# Patient Record
Sex: Male | Born: 1935 | Race: White | Hispanic: No | Smoking: Former smoker
Health system: Southern US, Community
[De-identification: ages and names within clinical notes are randomized; demographics above are authoritative.]

## PROBLEM LIST (undated history)

## (undated) DIAGNOSIS — M1990 Unspecified osteoarthritis, unspecified site: Secondary | ICD-10-CM

## (undated) DIAGNOSIS — K760 Fatty (change of) liver, not elsewhere classified: Secondary | ICD-10-CM

## (undated) DIAGNOSIS — D46C Myelodysplastic syndrome with isolated del(5q) chromosomal abnormality: Secondary | ICD-10-CM

## (undated) DIAGNOSIS — I350 Nonrheumatic aortic (valve) stenosis: Secondary | ICD-10-CM

## (undated) DIAGNOSIS — T4145XA Adverse effect of unspecified anesthetic, initial encounter: Secondary | ICD-10-CM

## (undated) DIAGNOSIS — I679 Cerebrovascular disease, unspecified: Secondary | ICD-10-CM

## (undated) DIAGNOSIS — K219 Gastro-esophageal reflux disease without esophagitis: Secondary | ICD-10-CM

## (undated) DIAGNOSIS — I5042 Chronic combined systolic (congestive) and diastolic (congestive) heart failure: Secondary | ICD-10-CM

## (undated) DIAGNOSIS — H9190 Unspecified hearing loss, unspecified ear: Secondary | ICD-10-CM

## (undated) DIAGNOSIS — T827XXA Infection and inflammatory reaction due to other cardiac and vascular devices, implants and grafts, initial encounter: Secondary | ICD-10-CM

## (undated) DIAGNOSIS — I509 Heart failure, unspecified: Secondary | ICD-10-CM

## (undated) DIAGNOSIS — I272 Pulmonary hypertension, unspecified: Secondary | ICD-10-CM

## (undated) DIAGNOSIS — D7589 Other specified diseases of blood and blood-forming organs: Secondary | ICD-10-CM

## (undated) DIAGNOSIS — D649 Anemia, unspecified: Secondary | ICD-10-CM

## (undated) DIAGNOSIS — G5790 Unspecified mononeuropathy of unspecified lower limb: Secondary | ICD-10-CM

## (undated) DIAGNOSIS — E785 Hyperlipidemia, unspecified: Secondary | ICD-10-CM

## (undated) DIAGNOSIS — I10 Essential (primary) hypertension: Secondary | ICD-10-CM

## (undated) DIAGNOSIS — M109 Gout, unspecified: Secondary | ICD-10-CM

## (undated) DIAGNOSIS — D696 Thrombocytopenia, unspecified: Secondary | ICD-10-CM

## (undated) DIAGNOSIS — T8859XA Other complications of anesthesia, initial encounter: Secondary | ICD-10-CM

## (undated) DIAGNOSIS — Z992 Dependence on renal dialysis: Secondary | ICD-10-CM

## (undated) DIAGNOSIS — N2581 Secondary hyperparathyroidism of renal origin: Secondary | ICD-10-CM

## (undated) DIAGNOSIS — I739 Peripheral vascular disease, unspecified: Secondary | ICD-10-CM

## (undated) DIAGNOSIS — I2510 Atherosclerotic heart disease of native coronary artery without angina pectoris: Secondary | ICD-10-CM

## (undated) DIAGNOSIS — D61818 Other pancytopenia: Secondary | ICD-10-CM

## (undated) DIAGNOSIS — K8020 Calculus of gallbladder without cholecystitis without obstruction: Secondary | ICD-10-CM

## (undated) DIAGNOSIS — I959 Hypotension, unspecified: Secondary | ICD-10-CM

## (undated) DIAGNOSIS — IMO0001 Reserved for inherently not codable concepts without codable children: Secondary | ICD-10-CM

## (undated) DIAGNOSIS — N186 End stage renal disease: Secondary | ICD-10-CM

## (undated) DIAGNOSIS — A4902 Methicillin resistant Staphylococcus aureus infection, unspecified site: Secondary | ICD-10-CM

## (undated) HISTORY — DX: Unspecified osteoarthritis, unspecified site: M19.90

## (undated) HISTORY — DX: Calculus of gallbladder without cholecystitis without obstruction: K80.20

## (undated) HISTORY — DX: Essential (primary) hypertension: I10

## (undated) HISTORY — PX: TONSILLECTOMY: SUR1361

## (undated) HISTORY — DX: Cerebrovascular disease, unspecified: I67.9

## (undated) HISTORY — DX: Gastro-esophageal reflux disease without esophagitis: K21.9

## (undated) HISTORY — DX: Fatty (change of) liver, not elsewhere classified: K76.0

## (undated) HISTORY — DX: Hyperlipidemia, unspecified: E78.5

## (undated) HISTORY — DX: Methicillin resistant Staphylococcus aureus infection, unspecified site: A49.02

## (undated) HISTORY — PX: OTHER SURGICAL HISTORY: SHX169

## (undated) HISTORY — DX: Myelodysplastic syndrome with isolated del(5q) chromosomal abnormality: D46.C

## (undated) HISTORY — DX: Peripheral vascular disease, unspecified: I73.9

## (undated) HISTORY — PX: CATARACT EXTRACTION W/ INTRAOCULAR LENS  IMPLANT, BILATERAL: SHX1307

## (undated) HISTORY — DX: Atherosclerotic heart disease of native coronary artery without angina pectoris: I25.10

## (undated) HISTORY — DX: Secondary hyperparathyroidism of renal origin: N25.81

---

## 1996-11-27 DIAGNOSIS — I2510 Atherosclerotic heart disease of native coronary artery without angina pectoris: Secondary | ICD-10-CM

## 1996-11-27 HISTORY — PX: CORONARY ARTERY BYPASS GRAFT: SHX141

## 1996-11-27 HISTORY — DX: Atherosclerotic heart disease of native coronary artery without angina pectoris: I25.10

## 1999-08-10 ENCOUNTER — Ambulatory Visit (HOSPITAL_COMMUNITY): Admission: RE | Admit: 1999-08-10 | Discharge: 1999-08-10 | Payer: Self-pay | Admitting: *Deleted

## 1999-08-29 ENCOUNTER — Ambulatory Visit (HOSPITAL_COMMUNITY): Admission: RE | Admit: 1999-08-29 | Discharge: 1999-08-29 | Payer: Self-pay | Admitting: Urology

## 1999-08-29 ENCOUNTER — Encounter: Payer: Self-pay | Admitting: Urology

## 1999-08-29 ENCOUNTER — Encounter (INDEPENDENT_AMBULATORY_CARE_PROVIDER_SITE_OTHER): Payer: Self-pay

## 1999-09-06 ENCOUNTER — Ambulatory Visit (HOSPITAL_COMMUNITY): Admission: RE | Admit: 1999-09-06 | Discharge: 1999-09-06 | Payer: Self-pay | Admitting: *Deleted

## 1999-09-09 ENCOUNTER — Inpatient Hospital Stay (HOSPITAL_COMMUNITY): Admission: AD | Admit: 1999-09-09 | Discharge: 1999-09-10 | Payer: Self-pay | Admitting: *Deleted

## 1999-10-10 ENCOUNTER — Encounter: Payer: Self-pay | Admitting: Urology

## 1999-10-11 ENCOUNTER — Encounter: Payer: Self-pay | Admitting: Urology

## 1999-10-11 ENCOUNTER — Encounter: Admission: RE | Admit: 1999-10-11 | Discharge: 1999-10-11 | Payer: Self-pay | Admitting: Urology

## 1999-10-12 ENCOUNTER — Inpatient Hospital Stay (HOSPITAL_COMMUNITY): Admission: RE | Admit: 1999-10-12 | Discharge: 1999-10-18 | Payer: Self-pay | Admitting: Urology

## 1999-11-07 ENCOUNTER — Encounter (HOSPITAL_COMMUNITY): Admission: RE | Admit: 1999-11-07 | Discharge: 2000-02-05 | Payer: Self-pay | Admitting: *Deleted

## 1999-11-28 HISTORY — PX: OTHER SURGICAL HISTORY: SHX169

## 2000-05-28 ENCOUNTER — Ambulatory Visit (HOSPITAL_COMMUNITY): Admission: RE | Admit: 2000-05-28 | Discharge: 2000-05-28 | Payer: Self-pay | Admitting: Vascular Surgery

## 2000-07-11 ENCOUNTER — Ambulatory Visit: Admission: RE | Admit: 2000-07-11 | Discharge: 2000-07-11 | Payer: Self-pay | Admitting: Vascular Surgery

## 2000-09-10 ENCOUNTER — Encounter: Payer: Self-pay | Admitting: Urology

## 2000-09-12 ENCOUNTER — Ambulatory Visit (HOSPITAL_COMMUNITY): Admission: RE | Admit: 2000-09-12 | Discharge: 2000-09-12 | Payer: Self-pay | Admitting: Urology

## 2000-09-12 ENCOUNTER — Encounter (INDEPENDENT_AMBULATORY_CARE_PROVIDER_SITE_OTHER): Payer: Self-pay | Admitting: Specialist

## 2000-09-12 ENCOUNTER — Encounter: Payer: Self-pay | Admitting: Urology

## 2001-05-08 ENCOUNTER — Encounter: Payer: Self-pay | Admitting: Nephrology

## 2001-05-08 ENCOUNTER — Encounter: Admission: RE | Admit: 2001-05-08 | Discharge: 2001-05-08 | Payer: Self-pay | Admitting: Nephrology

## 2001-05-15 ENCOUNTER — Encounter (INDEPENDENT_AMBULATORY_CARE_PROVIDER_SITE_OTHER): Payer: Self-pay | Admitting: Specialist

## 2001-05-15 ENCOUNTER — Ambulatory Visit (HOSPITAL_COMMUNITY): Admission: RE | Admit: 2001-05-15 | Discharge: 2001-05-15 | Payer: Self-pay | Admitting: Urology

## 2001-05-15 ENCOUNTER — Encounter: Payer: Self-pay | Admitting: Urology

## 2002-01-09 ENCOUNTER — Ambulatory Visit (HOSPITAL_COMMUNITY): Admission: RE | Admit: 2002-01-09 | Discharge: 2002-01-09 | Payer: Self-pay | Admitting: Vascular Surgery

## 2002-03-12 ENCOUNTER — Ambulatory Visit (HOSPITAL_BASED_OUTPATIENT_CLINIC_OR_DEPARTMENT_OTHER): Admission: RE | Admit: 2002-03-12 | Discharge: 2002-03-12 | Payer: Self-pay | Admitting: Urology

## 2002-03-12 ENCOUNTER — Encounter (INDEPENDENT_AMBULATORY_CARE_PROVIDER_SITE_OTHER): Payer: Self-pay | Admitting: Specialist

## 2002-04-14 ENCOUNTER — Encounter: Payer: Self-pay | Admitting: Nephrology

## 2002-04-14 ENCOUNTER — Ambulatory Visit (HOSPITAL_COMMUNITY): Admission: RE | Admit: 2002-04-14 | Discharge: 2002-04-14 | Payer: Self-pay | Admitting: Nephrology

## 2002-04-25 ENCOUNTER — Ambulatory Visit (HOSPITAL_COMMUNITY): Admission: RE | Admit: 2002-04-25 | Discharge: 2002-04-25 | Payer: Self-pay | Admitting: Vascular Surgery

## 2002-04-25 ENCOUNTER — Encounter: Payer: Self-pay | Admitting: Vascular Surgery

## 2002-05-28 ENCOUNTER — Ambulatory Visit (HOSPITAL_COMMUNITY): Admission: RE | Admit: 2002-05-28 | Discharge: 2002-05-28 | Payer: Self-pay | Admitting: Vascular Surgery

## 2002-11-27 HISTORY — PX: CARDIAC CATHETERIZATION: SHX172

## 2003-03-17 ENCOUNTER — Ambulatory Visit (HOSPITAL_COMMUNITY): Admission: RE | Admit: 2003-03-17 | Discharge: 2003-03-17 | Payer: Self-pay | Admitting: Internal Medicine

## 2003-03-17 ENCOUNTER — Encounter: Payer: Self-pay | Admitting: Internal Medicine

## 2003-03-29 ENCOUNTER — Encounter: Payer: Self-pay | Admitting: Emergency Medicine

## 2003-03-29 ENCOUNTER — Inpatient Hospital Stay (HOSPITAL_COMMUNITY): Admission: EM | Admit: 2003-03-29 | Discharge: 2003-03-31 | Payer: Self-pay | Admitting: Emergency Medicine

## 2003-05-08 ENCOUNTER — Encounter: Payer: Self-pay | Admitting: Urology

## 2003-05-08 ENCOUNTER — Encounter: Admission: RE | Admit: 2003-05-08 | Discharge: 2003-05-08 | Payer: Self-pay | Admitting: Urology

## 2003-05-12 ENCOUNTER — Emergency Department (HOSPITAL_COMMUNITY): Admission: EM | Admit: 2003-05-12 | Discharge: 2003-05-13 | Payer: Self-pay | Admitting: Emergency Medicine

## 2003-05-12 ENCOUNTER — Ambulatory Visit (HOSPITAL_BASED_OUTPATIENT_CLINIC_OR_DEPARTMENT_OTHER): Admission: RE | Admit: 2003-05-12 | Discharge: 2003-05-12 | Payer: Self-pay | Admitting: Urology

## 2003-05-13 ENCOUNTER — Encounter: Payer: Self-pay | Admitting: Emergency Medicine

## 2003-05-25 ENCOUNTER — Inpatient Hospital Stay (HOSPITAL_COMMUNITY): Admission: EM | Admit: 2003-05-25 | Discharge: 2003-05-25 | Payer: Self-pay | Admitting: *Deleted

## 2003-05-25 ENCOUNTER — Encounter: Payer: Self-pay | Admitting: *Deleted

## 2003-06-01 ENCOUNTER — Encounter: Payer: Self-pay | Admitting: Emergency Medicine

## 2003-06-01 ENCOUNTER — Inpatient Hospital Stay (HOSPITAL_COMMUNITY): Admission: EM | Admit: 2003-06-01 | Discharge: 2003-06-02 | Payer: Self-pay | Admitting: Emergency Medicine

## 2003-07-27 ENCOUNTER — Encounter: Payer: Self-pay | Admitting: Emergency Medicine

## 2003-07-27 ENCOUNTER — Emergency Department (HOSPITAL_COMMUNITY): Admission: EM | Admit: 2003-07-27 | Discharge: 2003-07-27 | Payer: Self-pay | Admitting: Emergency Medicine

## 2003-07-27 ENCOUNTER — Inpatient Hospital Stay (HOSPITAL_COMMUNITY): Admission: EM | Admit: 2003-07-27 | Discharge: 2003-07-28 | Payer: Self-pay | Admitting: Cardiology

## 2004-01-03 ENCOUNTER — Observation Stay (HOSPITAL_COMMUNITY): Admission: EM | Admit: 2004-01-03 | Discharge: 2004-01-03 | Payer: Self-pay | Admitting: Emergency Medicine

## 2004-03-10 ENCOUNTER — Encounter: Payer: Self-pay | Admitting: Cardiology

## 2004-03-31 ENCOUNTER — Encounter (INDEPENDENT_AMBULATORY_CARE_PROVIDER_SITE_OTHER): Payer: Self-pay | Admitting: *Deleted

## 2004-03-31 ENCOUNTER — Ambulatory Visit (HOSPITAL_COMMUNITY): Admission: RE | Admit: 2004-03-31 | Discharge: 2004-03-31 | Payer: Self-pay | Admitting: Urology

## 2004-03-31 ENCOUNTER — Ambulatory Visit (HOSPITAL_BASED_OUTPATIENT_CLINIC_OR_DEPARTMENT_OTHER): Admission: RE | Admit: 2004-03-31 | Discharge: 2004-03-31 | Payer: Self-pay | Admitting: Urology

## 2004-10-11 ENCOUNTER — Ambulatory Visit (HOSPITAL_COMMUNITY): Admission: RE | Admit: 2004-10-11 | Discharge: 2004-10-11 | Payer: Self-pay | Admitting: Vascular Surgery

## 2004-11-03 ENCOUNTER — Ambulatory Visit (HOSPITAL_BASED_OUTPATIENT_CLINIC_OR_DEPARTMENT_OTHER): Admission: RE | Admit: 2004-11-03 | Discharge: 2004-11-03 | Payer: Self-pay | Admitting: Urology

## 2004-11-03 ENCOUNTER — Encounter (INDEPENDENT_AMBULATORY_CARE_PROVIDER_SITE_OTHER): Payer: Self-pay | Admitting: *Deleted

## 2004-11-03 ENCOUNTER — Ambulatory Visit (HOSPITAL_COMMUNITY): Admission: RE | Admit: 2004-11-03 | Discharge: 2004-11-03 | Payer: Self-pay | Admitting: Urology

## 2004-11-27 HISTORY — PX: ARTERIOVENOUS GRAFT PLACEMENT: SUR1029

## 2005-07-04 ENCOUNTER — Encounter: Admission: RE | Admit: 2005-07-04 | Discharge: 2005-07-04 | Payer: Self-pay | Admitting: Neurology

## 2005-09-16 ENCOUNTER — Emergency Department (HOSPITAL_COMMUNITY): Admission: EM | Admit: 2005-09-16 | Discharge: 2005-09-16 | Payer: Self-pay | Admitting: Emergency Medicine

## 2005-10-15 ENCOUNTER — Inpatient Hospital Stay (HOSPITAL_COMMUNITY): Admission: EM | Admit: 2005-10-15 | Discharge: 2005-10-17 | Payer: Self-pay | Admitting: Emergency Medicine

## 2005-10-16 ENCOUNTER — Ambulatory Visit: Payer: Self-pay | Admitting: Cardiology

## 2005-10-26 ENCOUNTER — Ambulatory Visit: Payer: Self-pay | Admitting: *Deleted

## 2005-11-30 ENCOUNTER — Encounter (INDEPENDENT_AMBULATORY_CARE_PROVIDER_SITE_OTHER): Payer: Self-pay | Admitting: *Deleted

## 2005-11-30 ENCOUNTER — Ambulatory Visit (HOSPITAL_BASED_OUTPATIENT_CLINIC_OR_DEPARTMENT_OTHER): Admission: RE | Admit: 2005-11-30 | Discharge: 2005-11-30 | Payer: Self-pay | Admitting: Urology

## 2006-01-30 ENCOUNTER — Ambulatory Visit: Payer: Self-pay | Admitting: *Deleted

## 2006-07-17 ENCOUNTER — Encounter (INDEPENDENT_AMBULATORY_CARE_PROVIDER_SITE_OTHER): Payer: Self-pay | Admitting: *Deleted

## 2006-07-17 ENCOUNTER — Ambulatory Visit (HOSPITAL_BASED_OUTPATIENT_CLINIC_OR_DEPARTMENT_OTHER): Admission: RE | Admit: 2006-07-17 | Discharge: 2006-07-17 | Payer: Self-pay | Admitting: Urology

## 2006-09-18 ENCOUNTER — Ambulatory Visit (HOSPITAL_COMMUNITY): Admission: RE | Admit: 2006-09-18 | Discharge: 2006-09-18 | Payer: Self-pay | Admitting: Vascular Surgery

## 2006-10-30 ENCOUNTER — Ambulatory Visit (HOSPITAL_COMMUNITY): Admission: RE | Admit: 2006-10-30 | Discharge: 2006-10-30 | Payer: Self-pay | Admitting: Vascular Surgery

## 2006-11-27 HISTORY — PX: AV FISTULA REPAIR: SHX563

## 2007-01-31 ENCOUNTER — Ambulatory Visit (HOSPITAL_COMMUNITY): Admission: RE | Admit: 2007-01-31 | Discharge: 2007-01-31 | Payer: Self-pay | Admitting: Nephrology

## 2007-01-31 ENCOUNTER — Encounter (INDEPENDENT_AMBULATORY_CARE_PROVIDER_SITE_OTHER): Payer: Self-pay | Admitting: *Deleted

## 2007-01-31 ENCOUNTER — Ambulatory Visit (HOSPITAL_BASED_OUTPATIENT_CLINIC_OR_DEPARTMENT_OTHER): Admission: RE | Admit: 2007-01-31 | Discharge: 2007-01-31 | Payer: Self-pay | Admitting: Urology

## 2007-02-03 ENCOUNTER — Inpatient Hospital Stay (HOSPITAL_COMMUNITY): Admission: EM | Admit: 2007-02-03 | Discharge: 2007-02-09 | Payer: Self-pay | Admitting: Emergency Medicine

## 2007-03-12 ENCOUNTER — Ambulatory Visit: Payer: Self-pay | Admitting: Vascular Surgery

## 2007-03-13 ENCOUNTER — Ambulatory Visit: Payer: Self-pay | Admitting: Vascular Surgery

## 2007-03-13 ENCOUNTER — Ambulatory Visit (HOSPITAL_COMMUNITY): Admission: RE | Admit: 2007-03-13 | Discharge: 2007-03-13 | Payer: Self-pay | Admitting: Vascular Surgery

## 2007-05-06 ENCOUNTER — Ambulatory Visit (HOSPITAL_COMMUNITY): Admission: RE | Admit: 2007-05-06 | Discharge: 2007-05-06 | Payer: Self-pay | Admitting: Nephrology

## 2007-05-09 ENCOUNTER — Ambulatory Visit (HOSPITAL_COMMUNITY): Admission: RE | Admit: 2007-05-09 | Discharge: 2007-05-09 | Payer: Self-pay | Admitting: Vascular Surgery

## 2007-05-13 ENCOUNTER — Inpatient Hospital Stay (HOSPITAL_COMMUNITY): Admission: RE | Admit: 2007-05-13 | Discharge: 2007-05-14 | Payer: Self-pay | Admitting: Nephrology

## 2007-07-04 ENCOUNTER — Ambulatory Visit (HOSPITAL_COMMUNITY): Admission: RE | Admit: 2007-07-04 | Discharge: 2007-07-04 | Payer: Self-pay | Admitting: Nephrology

## 2007-07-18 ENCOUNTER — Ambulatory Visit: Payer: Self-pay | Admitting: *Deleted

## 2007-07-23 ENCOUNTER — Ambulatory Visit (HOSPITAL_COMMUNITY): Admission: RE | Admit: 2007-07-23 | Discharge: 2007-07-23 | Payer: Self-pay | Admitting: Vascular Surgery

## 2007-07-23 ENCOUNTER — Ambulatory Visit: Payer: Self-pay | Admitting: *Deleted

## 2007-08-08 ENCOUNTER — Ambulatory Visit (HOSPITAL_COMMUNITY): Admission: RE | Admit: 2007-08-08 | Discharge: 2007-08-08 | Payer: Self-pay | Admitting: Nephrology

## 2007-09-17 ENCOUNTER — Ambulatory Visit (HOSPITAL_BASED_OUTPATIENT_CLINIC_OR_DEPARTMENT_OTHER): Admission: RE | Admit: 2007-09-17 | Discharge: 2007-09-17 | Payer: Self-pay | Admitting: Urology

## 2007-09-17 ENCOUNTER — Encounter (INDEPENDENT_AMBULATORY_CARE_PROVIDER_SITE_OTHER): Payer: Self-pay | Admitting: Urology

## 2007-10-25 ENCOUNTER — Inpatient Hospital Stay (HOSPITAL_COMMUNITY): Admission: RE | Admit: 2007-10-25 | Discharge: 2007-10-25 | Payer: Self-pay | Admitting: Nephrology

## 2007-12-03 ENCOUNTER — Ambulatory Visit: Payer: Self-pay | Admitting: Vascular Surgery

## 2007-12-10 ENCOUNTER — Ambulatory Visit: Payer: Self-pay | Admitting: Surgery

## 2007-12-10 ENCOUNTER — Inpatient Hospital Stay (HOSPITAL_COMMUNITY): Admission: RE | Admit: 2007-12-10 | Discharge: 2007-12-11 | Payer: Self-pay | Admitting: Surgery

## 2008-01-21 ENCOUNTER — Ambulatory Visit (HOSPITAL_BASED_OUTPATIENT_CLINIC_OR_DEPARTMENT_OTHER): Admission: RE | Admit: 2008-01-21 | Discharge: 2008-01-21 | Payer: Self-pay | Admitting: Urology

## 2008-01-21 ENCOUNTER — Encounter (INDEPENDENT_AMBULATORY_CARE_PROVIDER_SITE_OTHER): Payer: Self-pay | Admitting: Urology

## 2008-04-14 ENCOUNTER — Ambulatory Visit (HOSPITAL_COMMUNITY): Admission: RE | Admit: 2008-04-14 | Discharge: 2008-04-14 | Payer: Self-pay | Admitting: Nephrology

## 2008-06-04 ENCOUNTER — Ambulatory Visit (HOSPITAL_COMMUNITY): Admission: RE | Admit: 2008-06-04 | Discharge: 2008-06-04 | Payer: Self-pay | Admitting: Nephrology

## 2008-07-21 ENCOUNTER — Ambulatory Visit (HOSPITAL_BASED_OUTPATIENT_CLINIC_OR_DEPARTMENT_OTHER): Admission: RE | Admit: 2008-07-21 | Discharge: 2008-07-21 | Payer: Self-pay | Admitting: Urology

## 2008-07-21 ENCOUNTER — Encounter (INDEPENDENT_AMBULATORY_CARE_PROVIDER_SITE_OTHER): Payer: Self-pay | Admitting: Urology

## 2008-10-08 ENCOUNTER — Ambulatory Visit (HOSPITAL_COMMUNITY): Admission: RE | Admit: 2008-10-08 | Discharge: 2008-10-08 | Payer: Self-pay | Admitting: Nephrology

## 2008-12-10 ENCOUNTER — Ambulatory Visit (HOSPITAL_BASED_OUTPATIENT_CLINIC_OR_DEPARTMENT_OTHER): Admission: RE | Admit: 2008-12-10 | Discharge: 2008-12-10 | Payer: Self-pay | Admitting: Urology

## 2008-12-10 ENCOUNTER — Encounter (INDEPENDENT_AMBULATORY_CARE_PROVIDER_SITE_OTHER): Payer: Self-pay | Admitting: Urology

## 2009-02-11 ENCOUNTER — Ambulatory Visit (HOSPITAL_COMMUNITY): Admission: RE | Admit: 2009-02-11 | Discharge: 2009-02-11 | Payer: Self-pay | Admitting: Family Medicine

## 2009-02-18 ENCOUNTER — Ambulatory Visit (HOSPITAL_COMMUNITY): Admission: RE | Admit: 2009-02-18 | Discharge: 2009-02-18 | Payer: Self-pay | Admitting: Nephrology

## 2009-02-23 ENCOUNTER — Ambulatory Visit: Payer: Self-pay | Admitting: Cardiology

## 2009-05-03 ENCOUNTER — Inpatient Hospital Stay (HOSPITAL_COMMUNITY): Admission: EM | Admit: 2009-05-03 | Discharge: 2009-05-04 | Payer: Self-pay | Admitting: Emergency Medicine

## 2009-05-26 DIAGNOSIS — N2581 Secondary hyperparathyroidism of renal origin: Secondary | ICD-10-CM | POA: Insufficient documentation

## 2009-06-24 ENCOUNTER — Ambulatory Visit (HOSPITAL_COMMUNITY): Admission: RE | Admit: 2009-06-24 | Discharge: 2009-06-24 | Payer: Self-pay | Admitting: Nephrology

## 2009-08-30 ENCOUNTER — Ambulatory Visit: Payer: Self-pay | Admitting: Surgery

## 2009-08-31 ENCOUNTER — Ambulatory Visit (HOSPITAL_COMMUNITY): Admission: RE | Admit: 2009-08-31 | Discharge: 2009-08-31 | Payer: Self-pay | Admitting: Surgery

## 2009-09-28 ENCOUNTER — Ambulatory Visit (HOSPITAL_BASED_OUTPATIENT_CLINIC_OR_DEPARTMENT_OTHER): Admission: RE | Admit: 2009-09-28 | Discharge: 2009-09-28 | Payer: Self-pay | Admitting: Urology

## 2009-09-28 ENCOUNTER — Encounter (INDEPENDENT_AMBULATORY_CARE_PROVIDER_SITE_OTHER): Payer: Self-pay | Admitting: Urology

## 2009-12-21 ENCOUNTER — Ambulatory Visit (HOSPITAL_COMMUNITY): Admission: RE | Admit: 2009-12-21 | Discharge: 2009-12-21 | Payer: Self-pay | Admitting: Nephrology

## 2009-12-30 ENCOUNTER — Inpatient Hospital Stay (HOSPITAL_COMMUNITY): Admission: EM | Admit: 2009-12-30 | Discharge: 2010-01-02 | Payer: Self-pay | Admitting: Emergency Medicine

## 2009-12-30 ENCOUNTER — Ambulatory Visit: Payer: Self-pay | Admitting: Cardiology

## 2010-02-15 ENCOUNTER — Ambulatory Visit (HOSPITAL_COMMUNITY): Admission: RE | Admit: 2010-02-15 | Discharge: 2010-02-15 | Payer: Self-pay | Admitting: Nephrology

## 2010-04-08 ENCOUNTER — Ambulatory Visit: Payer: Self-pay | Admitting: Cardiology

## 2010-06-09 ENCOUNTER — Ambulatory Visit (HOSPITAL_COMMUNITY): Admission: RE | Admit: 2010-06-09 | Discharge: 2010-06-09 | Payer: Self-pay | Admitting: Nephrology

## 2010-06-12 ENCOUNTER — Emergency Department (HOSPITAL_COMMUNITY): Admission: EM | Admit: 2010-06-12 | Discharge: 2010-06-12 | Payer: Self-pay | Admitting: Emergency Medicine

## 2010-07-05 ENCOUNTER — Ambulatory Visit (HOSPITAL_BASED_OUTPATIENT_CLINIC_OR_DEPARTMENT_OTHER): Admission: RE | Admit: 2010-07-05 | Discharge: 2010-07-05 | Payer: Self-pay | Admitting: Urology

## 2010-10-18 ENCOUNTER — Ambulatory Visit (HOSPITAL_COMMUNITY): Admission: RE | Admit: 2010-10-18 | Discharge: 2010-10-18 | Payer: Self-pay | Admitting: Nephrology

## 2010-12-17 ENCOUNTER — Encounter: Payer: Self-pay | Admitting: Nephrology

## 2010-12-27 NOTE — Consult Note (Signed)
Summary: [facility]   Imported By: Roderic Ovens 01/18/2010 14:12:33  _____________________________________________________________________  External Attachment:    Type:   Image     Comment:   External Document

## 2010-12-27 NOTE — Assessment & Plan Note (Signed)
Summary: EPH   Visit Type:  Follow-up Primary Provider:  Dr.Mark cresenzo  CC:  no cardiology complaints today.  History of Present Illness: Andrew [name] returns today for further evaluation of his history of coronary artery disease, history of chronic bypass grafting in 1998, normal left ventricular systolic function, history of carotid artery disease, hyperlipidemia.  He is having no symptoms of angina or ischemia. He was recently hospitalized in [place] with tracheobronchitis. His beta blocker was temporarily stopped but he is back on his Toprol as of today. He denies any wheezing or cough. He does have some dyspnea on exertion which he says has not changed.  He denies orthopnea, PND or edema. He is tolerating dialysis well 3 times a week.  Current Medications (verified): 1)  Toprol Xl 50 Mg Xr24h-Tab (Metoprolol Succinate) .... Take 1 Tab Daily 2)  Aspir-Low 81 Mg Tbec (Aspirin) .... Take 1 Tab Daily 3)  Allopurinol 100 Mg Tabs (Allopurinol) .... Take 1 Tab Daily 4)  Dialyvite 800/zinc 60-0.3-0.006 Mg Tabs (B Complex-C-Zn-Folic Acid) .... Take 1 Tab Daily 5)  Renagel 800 Mg Tabs (Sevelamer Hcl) .... Take As Directed 6)  Sensipar 30 Mg Tabs (Cinacalcet Hcl) .... Take 1 Tab Daily 7)  Lipitor 10 Mg Tabs (Atorvastatin Calcium) .... Take 1 Tab Daily 8)  Miralax  Powd (Polyethylene Glycol 3350) .... Use As Needed 9)  Imdur 30 Mg Xr24h-Tab (Isosorbide Mononitrate) .... Take 1 Tab Daily 10)  Colcrys 0.6 Mg Tabs (Colchicine) .... Take 2 Tabs Daily  Allergies (verified): No Known Drug Allergies  Past History:  Past Medical History: Last updated: 05/26/2009 SECONDARY HYPERPARATHYROIDISM (ICD-588.81) PVD (ICD-443.9) HYPERTENSION, UNSPECIFIED (ICD-401.9) HYPERLIPIDEMIA-MIXED (ICD-272.4) CAD, UNSPECIFIED SITE (ICD-414.00)    Past Surgical History: Last updated: 05/26/2009 CABG '98 nephrectomy  Social History: Last updated: 05/26/2009 Married  Tobacco Use - Former.  Alcohol Use  - no Drug Use - no  Risk Factors: Smoking Status: quit (05/26/2009)  Review of Systems       negative other than history of present illness  Vital Signs:  Patient profile:   77 year old male Height:      69 inches Weight:      189 pounds BMI:     28.01 Pulse rate:   99 / minute BP sitting:   120 / 60  (right arm)  Vitals Entered By: Dreama Saa, CNA (Apr 08, 2010 10:05 AM)  Physical Exam  General:  elderly, hard of hearing, in no acute distress Head:  normocephalic and atraumatic Eyes:  PERRLA/EOM intact; conjunctiva and lids normal. Neck:  Neck supple, no JVD. No masses, thyromegaly or abnormal cervical nodes. Lungs:  decreased breath sounds throughout no rhonchi or wheezes Heart:  Non-displaced PMI, chest non-tender; regular rate and rhythm, S1, S2 without murmurs, rubs or gallops. Carotid upstroke normal, no bruit. Normal abdominal aortic size, no bruits. Femorals normal pulses, no bruits. Pedals normal pulses. No edema, no varicosities. Msk:  decreased ROM.   Pulses:  diminished but present lower extremities Extremities:  No clubbing or cyanosis. Neurologic:  Alert and oriented x 3. Skin:  Intact without lesions or rashes. Psych:  Normal affect.   Problems:  Medical Problems Added: 1)  Dx of Cad, Artery Bypass Graft  (ICD-414.04)  Impression & Recommendations:  Problem # 1:  CAD, UNSPECIFIED SITE (ICD-414.00) Assessment Unchanged  His updated medication list for this problem includes:    Toprol Xl 50 Mg Xr24h-tab (Metoprolol succinate) .Marland Kitchen... Take 1 tab daily    Aspir-low 81 Mg Tbec (  Aspirin) .Marland Kitchen... Take 1 tab daily    Imdur 30 Mg Xr24h-tab (Isosorbide mononitrate) .Marland Kitchen... Take 1 tab daily  Problem # 2:  CAD, ARTERY BYPASS GRAFT (ICD-414.04) Assessment: Unchanged  His updated medication list for this problem includes:    Toprol Xl 50 Mg Xr24h-tab (Metoprolol succinate) .Marland Kitchen... Take 1 tab daily    Aspir-low 81 Mg Tbec (Aspirin) .Marland Kitchen... Take 1 tab daily    Imdur  30 Mg Xr24h-tab (Isosorbide mononitrate) .Marland Kitchen... Take 1 tab daily  Problem # 3:  PVD (ICD-443.9) Assessment: Unchanged  Patient Instructions: 1)  Your physician recommends that you schedule a follow-up appointment in: 1 year 2)  Your physician recommends that you continue on your current medications as directed. Please refer to the Current Medication list given to you today.

## 2011-01-10 ENCOUNTER — Encounter: Payer: Self-pay | Admitting: Internal Medicine

## 2011-01-10 ENCOUNTER — Encounter: Payer: Self-pay | Admitting: Urgent Care

## 2011-01-10 ENCOUNTER — Ambulatory Visit (INDEPENDENT_AMBULATORY_CARE_PROVIDER_SITE_OTHER): Payer: Medicare Other | Admitting: Urgent Care

## 2011-01-10 DIAGNOSIS — K219 Gastro-esophageal reflux disease without esophagitis: Secondary | ICD-10-CM

## 2011-01-10 DIAGNOSIS — D649 Anemia, unspecified: Secondary | ICD-10-CM

## 2011-01-10 DIAGNOSIS — K921 Melena: Secondary | ICD-10-CM | POA: Insufficient documentation

## 2011-01-10 DIAGNOSIS — D630 Anemia in neoplastic disease: Secondary | ICD-10-CM | POA: Insufficient documentation

## 2011-01-10 DIAGNOSIS — Z8711 Personal history of peptic ulcer disease: Secondary | ICD-10-CM

## 2011-01-18 NOTE — Assessment & Plan Note (Signed)
Summary: CONSULT FOR TCS,HEME + STOOLS,ANEMIA   Vital Signs:  Patient profile:   77 year old male Height:      69 inches Weight:      188.50 pounds BMI:     27.94 Temp:     98.8 degrees F oral Pulse rate:   80 / minute BP sitting:   140 / 62  (left arm) Cuff size:   large  Vitals Entered By: Cloria Spring LPN (January 10, 2011 11:45 AM)  Visit Type:  Initial Consult Referring Provider:  Casimiro Needle, MD ([facility]) Primary Care Provider:  [place]  Chief Complaint:  HEME +.  History of Present Illness: cc: Dr Dory Larsen ([facility])  Pt here for evaluation anemia/heme positive stool at dialysis ctr.  Has not had a colonoscopy in past 30 yrs. s/p nephrectomy for renal ca 2001.  Denies rectal bleeding or melena.  Denies abd pain, nausea or vomiting.  c/o constipation, BM daily or every other day.  Denies straining.  Wt stable, appetite good.  Denies dysphagia or odynophagia.  Occ heartburn & indigestion, takes as needed prevacid once/month.  Takes ASA, but denied any other NSAIDs.  Thinks he was treated for H pylori yrs ago by Dr Nobie Putnam.  Believes last hgb 11, has been as low as 9 recently per pt.  Will request labs.  Denies hx blood transfusions.  Current Medications (verified): 1)  Aspir-Low 81 Mg Tbec (Aspirin) .... Take 1 Tab Daily 2)  Allopurinol 100 Mg Tabs (Allopurinol) .... Take 1 Tab Daily 3)  Dialyvite 800/zinc 60-0.3-0.006 Mg Tabs (B Complex-C-Zn-Folic Acid) .... Take 1 Tab Daily 4)  Renagel 800 Mg Tabs (Sevelamer Hcl) .... Take As Directed 5)  Sensipar 30 Mg Tabs (Cinacalcet Hcl) .... Take 1 Tab Daily 6)  Lipitor 10 Mg Tabs (Atorvastatin Calcium) .... Take 1 Tab Daily 7)  Miralax  Powd (Polyethylene Glycol 3350) .... Use As Needed 8)  Imdur 30 Mg Xr24h-Tab (Isosorbide Mononitrate) .... Take 1 Tab Daily 9)  Colcrys 0.6 Mg Tabs (Colchicine) .... Take 2 Tabs Daily 10)  Prevacid 30 Mg Cpdr (Lansoprazole) .Marland Kitchen.. 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  Social History: Widower since 1999 3 daughters-healthy Tobacco Use - quit 1985  Alcohol Use - no Drug Use - no  Review of Systems General:  Complains of fatigue; denies fever, chills, sweats, anorexia, weakness, malaise, weight loss, and sleep disorder. Resp:  Denies dyspnea at rest, dyspnea with exercise, cough, sputum, wheezing, coughing up blood, and pleurisy. GI:  See HPI; Denies vomiting blood, jaundice, gas/bloating, and fecal incontinence. GU:  Denies urinary burning, blood in urine, urinary frequency, urinary hesitancy, nocturnal urination, and urinary incontinence. MS:  Complains of joint pain / LOM and joint stiffness; denies joint swelling, joint deformity, low back pain, muscle weakness, muscle cramps, muscle atrophy, leg pain at night, leg pain with exertion, and shoulder pain / LOM hand / wrist pain (CTS); knees. Derm:  Denies rash, itching, dry skin, hives, moles, warts, and unhealing ulcers. Psych:  Denies depression, anxiety, memory loss, suicidal ideation, hallucinations, paranoia, phobia, and confusion. Heme:  Denies bruising, bleeding, and enlarged lymph  nodes.  Physical Exam  General:  Well developed, well nourished, no acute distress. Head:  Normocephalic and atraumatic. Eyes:  Sclera clear, no icterus. Ears:  decreased auditory acuity.  HOH, wears aide Nose:  No deformity, discharge,  or lesions. Mouth:  No deformity or lesions, dentition normal. Neck:  Supple; no masses or thyromegaly. Chest Wall:   Symmetrical,  no deformities . Lungs:  Clear throughout to auscultation. Heart:  Regular rate and rhythm; no murmurs, rubs,  or bruits. Abdomen:  Soft, nontender and nondistended. No masses, hepatosplenomegaly or hernias noted. Normal bowel sounds.without guarding and without rebound.   Rectal:  deferred until time of colonoscopy.   Msk:  Symmetrical with no gross deformities. Normal posture. Pulses:  Normal pulses noted. Extremities:  No clubbing, cyanosis, edema or deformities noted. Neurologic:  Alert and  oriented x4;  grossly normal neurologically. Skin:  Intact without significant lesions or rashes. Cervical Nodes:  No significant cervical adenopathy. Psych:  Alert and cooperative. Normal mood and affect.   Impression & Recommendations:  Problem # 1:  HEMOCCULT POSITIVE STOOL (ICD-578.1)  77 y/o caucasian male found to have anemia & hemoccult stool at dialysis.  I do  not have last labs.  He denies any GI symptoms, except occasional GERD for which he takes prn prevacid.  He has hx of DU yrs ago & believes he may have been treated for h pylori.  He is on ASA.  Differentials include colorectal ca, PUD, SB AVMs, or anemia of chronic disease.  Diagnostic colonoscopy plus/minus EGD to be performed by Dr. Suszanne Conners Rourk in the near future.  I have discussed risks and benefits which include, but are not limited to, bleeding, infection, perforation, or medication reaction.  The patient agrees with this plan and consent will be obtained.     Orders: Consultation Level III (04540)  Problem # 2:  ANEMIA (ICD-285.9) See #1  Problem # 3:  GERD (ICD-530.81) On as needed prevacid  Problem # 4:  DUODENAL ULCER, HX OF (ICD-V12.71) Assessment: Comment Only   Orders Added: 1)  Consultation Level III [99243]  Appended Document: CONSULT FOR TCS,HEME + STOOLS,ANEMIA Hgb 01/04/11->11.1 9.5 on 11/16/10

## 2011-01-18 NOTE — Letter (Signed)
Summary: [facility] POSS EGD ORDER  [facility] POSS EGD ORDER   Imported By: Ave Filter 01/10/2011 12:38:46  _____________________________________________________________________  External Attachment:    Type:   Image     Comment:   External Document

## 2011-01-24 ENCOUNTER — Ambulatory Visit (HOSPITAL_COMMUNITY)
Admission: RE | Admit: 2011-01-24 | Discharge: 2011-01-24 | Disposition: A | Discharge status: Home / Self Care | Payer: Medicare Other | Source: Ambulatory Visit | Attending: Internal Medicine | Admitting: Internal Medicine

## 2011-01-24 ENCOUNTER — Other Ambulatory Visit: Payer: Self-pay | Admitting: Internal Medicine

## 2011-01-24 ENCOUNTER — Encounter: Payer: Medicare Other | Admitting: Internal Medicine

## 2011-01-24 DIAGNOSIS — K210 Gastro-esophageal reflux disease with esophagitis: Secondary | ICD-10-CM

## 2011-01-24 DIAGNOSIS — D509 Iron deficiency anemia, unspecified: Secondary | ICD-10-CM | POA: Insufficient documentation

## 2011-01-24 DIAGNOSIS — K921 Melena: Secondary | ICD-10-CM | POA: Insufficient documentation

## 2011-01-24 DIAGNOSIS — K253 Acute gastric ulcer without hemorrhage or perforation: Secondary | ICD-10-CM

## 2011-01-24 DIAGNOSIS — I10 Essential (primary) hypertension: Secondary | ICD-10-CM | POA: Insufficient documentation

## 2011-01-24 DIAGNOSIS — K5730 Diverticulosis of large intestine without perforation or abscess without bleeding: Secondary | ICD-10-CM | POA: Insufficient documentation

## 2011-01-24 DIAGNOSIS — E785 Hyperlipidemia, unspecified: Secondary | ICD-10-CM | POA: Insufficient documentation

## 2011-01-24 DIAGNOSIS — R195 Other fecal abnormalities: Secondary | ICD-10-CM

## 2011-01-24 DIAGNOSIS — D126 Benign neoplasm of colon, unspecified: Secondary | ICD-10-CM

## 2011-01-24 DIAGNOSIS — Z79899 Other long term (current) drug therapy: Secondary | ICD-10-CM | POA: Insufficient documentation

## 2011-01-24 DIAGNOSIS — D649 Anemia, unspecified: Secondary | ICD-10-CM

## 2011-01-24 HISTORY — PX: ESOPHAGOGASTRODUODENOSCOPY: SHX1529

## 2011-01-24 HISTORY — PX: COLONOSCOPY: SHX174

## 2011-02-01 NOTE — Op Note (Addendum)
NAME:  Andrew [name], Andrew [name]                ACCOUNT NO.:  [id number]  MEDICAL RECORD NO.:  [id number]           PATIENT TYPE:  O  LOCATION:  DAYP                          FACILITY:  [facility]  PHYSICIAN:  R. Roetta Sessions, M.D. DATE OF BIRTH:  01-25-1926  DATE OF PROCEDURE:  01/24/2011 DATE OF DISCHARGE:                              OPERATIVE REPORT   PROCEDURE:  Esophagogastroduodenoscopy with biopsy and colonoscopy.  INDICATIONS FOR PROCEDURE:  An 77 year old gentleman found to be anemic and Hemoccult positive, no gross GI bleeding, occasional reflux symptoms for which he takes Prevacid with good control, distant history of peptic ulcer disease, distant colonoscopy without further information being known.  Colonoscopy and EGD now being done.  Risks, benefits, limitations, alternatives, and imponderables have been discussed, questions were answered.  Please see the documentation in the medical record.  PROCEDURE NOTE:  O2 saturation, blood pressure, pulse, and respirations were monitored throughout the entirety procedure.  CONSCIOUS SEDATION:  IV Versed and Demerol in incremental doses.  INSTRUMENT:  Pentax video chip system.  Cetacaine spray for topical pharyngeal anesthesia.  FINDINGS:  Digital rectal exam revealed no abnormalities.  Endoscopic findings, prep was suboptimal, but doable.  Colon:  Colonic mucosa surveyed from the rectosigmoid junction through the left transverse right colon to the appendiceal orifice, ileocecal valve/cecum.  These structures were well seen and photographed for the record.  From this level, the scope was slowly and cautiously withdrawn.  All previously mentioned mucosal surfaces were again seen.  The patient was noted to have scattered pancolonic diverticula.  Remaining colonic mucosa appeared normal aside from a prominent vascular pattern diffusely.  The scope was pulled down into the rectum where thorough examination of the rectal mucosa including  retroflex view of the anal verge demonstrated no abnormalities.  The patient tolerated the procedure well.  Cecal withdrawal time 11 minutes.  The patient was subsequently prepared for EGD.  EGD findings, examination of the tubular esophagus revealed no mucosal abnormalities aside from a couple of tiny distal esophageal erosions.  Stomach:  Gastric cavity was emptied and insufflated well with air. Thorough examination of the gastric mucosa including retroflexed proximal stomach, esophagogastric junction demonstrated multiple antral erosions and a small hiatal hernia.  There was no ulcer infiltrating process seen.  Pylorus was patent, easily traversed.  Examination of the bulb and second portion revealed bulbar erosions and some nodularity at the bulb, otherwise D1 and D2 appeared unremarkable.  Therapeutic/diagnostic maneuvers performed:  Biopsies of antrum were taken for histologic study.  The patient tolerated the procedure well and was reactive to the Endoscopy.  IMPRESSION:  Colonoscopy, normal rectum, prominent vascular pattern, but no discrete AVM or hemangioma, etc., seen, scattered pancolonic diverticula, remaining colonic mucosa appeared normal and again normal rectum.  EGD findings, a couple of tiny distal esophageal erosions consistent with mild erosive reflux esophagitis, otherwise normal esophagus, small hiatal hernia, antral and bulbar erosions of uncertain significance, status post antral biopsy for histology, otherwise normal stomach D1 through D2.  I suspect NSAID affect.  Need to rule out H. Pylori.  RECOMMENDATIONS: 1. Literature on diverticulosis, reflux, and hiatal hernia  provided to     Andrew [name].  Continue Prevacid 30 mgr orally daily. 2. Followup on path. 3. Further recommendations to follow.     Jonathon Bellows, M.D.     RMR/MEDQ  D:  01/24/2011  T:  01/24/2011  Job:  [id number]  cc:   Mindi Slicker. Lowell Guitar, M.D. Fax: [phone]  Madelin Rear. Sherwood [name],  MD Fax: [phone]  Electronically Signed by Lorrin Goodell M.D. on 01/31/2011 02:42:21 PM Electronically Signed by Lorrin Goodell M.D. on 01/31/2011 03:08:33 PM Electronically Signed by Lorrin Goodell M.D. on 01/31/2011 03:33:24 PM Electronically Signed by Lorrin Goodell M.D. on 01/31/2011 04:08:09 PM Electronically Signed by Lorrin Goodell M.D. on 01/31/2011 84:69:62 PM Electronically Signed by Lorrin Goodell M.D. on 01/31/2011 05:11:22 PM Electronically Signed by Lorrin Goodell M.D. on 01/31/2011 05:11:22 PM Electronically Signed by Lorrin Goodell M.D. on 01/31/2011 05:40:39 PM Electronically Signed by Lorrin Goodell M.D. on 01/31/2011 07:36:58 PM

## 2011-02-06 ENCOUNTER — Encounter: Payer: Self-pay | Admitting: Internal Medicine

## 2011-02-07 ENCOUNTER — Encounter (INDEPENDENT_AMBULATORY_CARE_PROVIDER_SITE_OTHER): Payer: Self-pay

## 2011-02-13 ENCOUNTER — Encounter (INDEPENDENT_AMBULATORY_CARE_PROVIDER_SITE_OTHER): Payer: Self-pay | Admitting: *Deleted

## 2011-02-14 NOTE — Letter (Signed)
Summary: Recall, Labs Needed  [facility]  [address]   [place], [place] [address]   Phone: [phone]  Fax: [phone]    February 07, 2011  HAYDAN MANSOURI [address] [place], [place]  [address]  [place] Jun 06, 1926   Dear Mr. Nordgren,   Our records indicate it is time to repeat your blood work.  You can take the enclosed form to the lab on or near the date indicated.  Please make note of the new location of the lab:   [address], 2nd floor   [facility]  Our office will call you within a week to ten business days with the results.  If you do not hear from [place] in 10 business days, you should call the office.  If you have any questions regarding this, call the office at [phone], and ask for the nurse.  Labs are due on 03/20/11.   Sincerely,    Hendricks Limes LPN  [facility] Ph: [phone]   Fax: [phone]

## 2011-02-14 NOTE — Miscellaneous (Signed)
Summary: Orders Update  Clinical Lists Changes  Orders: Added new Test order of T-CBC w/Diff ([id number]) - Signed 

## 2011-02-15 LAB — DIFFERENTIAL
Basophils Absolute: 0 10*3/uL (ref 0.0–0.1)
Basophils Relative: 0 % (ref 0–1)
Basophils Relative: 0 % (ref 0–1)
Basophils Relative: 1 % (ref 0–1)
Eosinophils Absolute: 0 10*3/uL (ref 0.0–0.7)
Eosinophils Absolute: 0.1 10*3/uL (ref 0.0–0.7)
Eosinophils Relative: 0 % (ref 0–5)
Lymphocytes Relative: 9 % — ABNORMAL LOW (ref 12–46)
Lymphocytes Relative: 9 % — ABNORMAL LOW (ref 12–46)
Lymphs Abs: 0.4 10*3/uL — ABNORMAL LOW (ref 0.7–4.0)
Monocytes Absolute: 0.4 10*3/uL (ref 0.1–1.0)
Monocytes Absolute: 0.5 10*3/uL (ref 0.1–1.0)
Monocytes Relative: 12 % (ref 3–12)
Monocytes Relative: 7 % (ref 3–12)
Neutro Abs: 2.5 10*3/uL (ref 1.7–7.7)
Neutro Abs: 3.7 10*3/uL (ref 1.7–7.7)
Neutro Abs: 5.4 10*3/uL (ref 1.7–7.7)
Neutrophils Relative %: 84 % — ABNORMAL HIGH (ref 43–77)
Neutrophils Relative %: 84 % — ABNORMAL HIGH (ref 43–77)
Neutrophils Relative %: 87 % — ABNORMAL HIGH (ref 43–77)

## 2011-02-15 LAB — CBC
HCT: 31.4 % — ABNORMAL LOW (ref 39.0–52.0)
HCT: 31.6 % — ABNORMAL LOW (ref 39.0–52.0)
Hemoglobin: 10.5 g/dL — ABNORMAL LOW (ref 13.0–17.0)
Hemoglobin: 9.9 g/dL — ABNORMAL LOW (ref 13.0–17.0)
MCHC: 33.1 g/dL (ref 30.0–36.0)
MCHC: 33.6 g/dL (ref 30.0–36.0)
MCV: 99 fL (ref 78.0–100.0)
Platelets: 159 10*3/uL (ref 150–400)
Platelets: 167 10*3/uL (ref 150–400)
Platelets: 175 10*3/uL (ref 150–400)
RBC: 3.17 MIL/uL — ABNORMAL LOW (ref 4.22–5.81)
RDW: 15.1 % (ref 11.5–15.5)
RDW: 15.7 % — ABNORMAL HIGH (ref 11.5–15.5)
WBC: 4.2 10*3/uL (ref 4.0–10.5)

## 2011-02-15 LAB — PHOSPHORUS: Phosphorus: 3.1 mg/dL (ref 2.3–4.6)

## 2011-02-15 LAB — URIC ACID: Uric Acid, Serum: 3 mg/dL — ABNORMAL LOW (ref 4.0–7.8)

## 2011-02-15 LAB — CARDIAC PANEL(CRET KIN+CKTOT+MB+TROPI)
Relative Index: 2 (ref 0.0–2.5)
Troponin I: 0.1 ng/mL — ABNORMAL HIGH (ref 0.00–0.06)
Troponin I: 0.11 ng/mL — ABNORMAL HIGH (ref 0.00–0.06)

## 2011-02-15 LAB — BASIC METABOLIC PANEL
BUN: 24 mg/dL — ABNORMAL HIGH (ref 6–23)
CO2: 29 mEq/L (ref 19–32)
CO2: 31 mEq/L (ref 19–32)
Calcium: 7.3 mg/dL — ABNORMAL LOW (ref 8.4–10.5)
Calcium: 7.9 mg/dL — ABNORMAL LOW (ref 8.4–10.5)
Chloride: 101 mEq/L (ref 96–112)
Chloride: 95 mEq/L — ABNORMAL LOW (ref 96–112)
Creatinine, Ser: 8.54 mg/dL — ABNORMAL HIGH (ref 0.4–1.5)
Creatinine, Ser: 9.55 mg/dL — ABNORMAL HIGH (ref 0.4–1.5)
GFR calc Af Amer: 15 mL/min — ABNORMAL LOW (ref >60)
GFR calc Af Amer: 6 mL/min — ABNORMAL LOW (ref >60)
GFR calc Af Amer: 9 mL/min — ABNORMAL LOW (ref >60)
GFR calc non Af Amer: 5 mL/min — ABNORMAL LOW (ref >60)
GFR calc non Af Amer: 6 mL/min — ABNORMAL LOW (ref >60)
Glucose, Bld: 130 mg/dL — ABNORMAL HIGH (ref 70–99)
Potassium: 4 mEq/L (ref 3.5–5.1)
Potassium: 4.7 mEq/L (ref 3.5–5.1)
Sodium: 135 mEq/L (ref 135–145)
Sodium: 137 mEq/L (ref 135–145)
Sodium: 139 mEq/L (ref 135–145)

## 2011-02-15 LAB — POCT CARDIAC MARKERS: Myoglobin, poc: >500 ng/mL (ref 12–200)

## 2011-02-15 LAB — CK TOTAL AND CKMB (NOT AT ARMC)
CK, MB: 4.6 ng/mL — ABNORMAL HIGH (ref 0.3–4.0)
Total CK: 212 U/L (ref 7–232)

## 2011-02-15 LAB — MAGNESIUM: Magnesium: 2.3 mg/dL (ref 1.5–2.5)

## 2011-02-17 ENCOUNTER — Encounter: Payer: Self-pay | Admitting: Gastroenterology

## 2011-02-23 NOTE — Letter (Signed)
Summary: Scheduled Appointment  [facility]  [address]   [place], [place] [address]   Phone: [phone]  Fax: [phone]    February 13, 2011   Dear: Andrew [name]            DOB: 01/04/1926    I have been instructed to schedule you an appointment in our office.  Your appointment is as follows:   Date:               March 20, 2011   Time:               9:00 AM    Please be here 15 minutes early.   Provider:          Tana Coast, PA     Please contact the office if you need to reschedule this appointment for a more convenient time.   Thank you,    Diana Eves       [facility] Ph: [phone]   Fax: [phone]

## 2011-03-01 LAB — POCT I-STAT, CHEM 8
HCT: 38 % — ABNORMAL LOW (ref 39.0–52.0)
Hemoglobin: 12.9 g/dL — ABNORMAL LOW (ref 13.0–17.0)
Potassium: 5.2 mEq/L — ABNORMAL HIGH (ref 3.5–5.1)
Sodium: 136 mEq/L (ref 135–145)
TCO2: 30 mmol/L (ref 0–100)

## 2011-03-06 LAB — COMPREHENSIVE METABOLIC PANEL
ALT: 19 U/L (ref 0–53)
AST: 19 U/L (ref 0–37)
Alkaline Phosphatase: 96 U/L (ref 39–117)
CO2: 26 mEq/L (ref 19–32)
Calcium: 8.8 mg/dL (ref 8.4–10.5)
GFR calc Af Amer: 5 mL/min — ABNORMAL LOW (ref >60)
GFR calc non Af Amer: 4 mL/min — ABNORMAL LOW (ref >60)
Glucose, Bld: 140 mg/dL — ABNORMAL HIGH (ref 70–99)
Potassium: 6.3 mEq/L (ref 3.5–5.1)
Sodium: 139 mEq/L (ref 135–145)

## 2011-03-06 LAB — DIFFERENTIAL
Basophils Absolute: 0 10*3/uL (ref 0.0–0.1)
Basophils Relative: 0 % (ref 0–1)
Basophils Relative: 1 % (ref 0–1)
Eosinophils Absolute: 0.1 10*3/uL (ref 0.0–0.7)
Eosinophils Absolute: 0.5 10*3/uL (ref 0.0–0.7)
Eosinophils Relative: 4 % (ref 0–5)
Lymphs Abs: 2.6 10*3/uL (ref 0.7–4.0)
Monocytes Relative: 11 % (ref 3–12)
Monocytes Relative: 7 % (ref 3–12)
Neutro Abs: 3.4 10*3/uL (ref 1.7–7.7)
Neutrophils Relative %: 62 % (ref 43–77)
Neutrophils Relative %: 65 % (ref 43–77)

## 2011-03-06 LAB — CBC
Hemoglobin: 14.4 g/dL (ref 13.0–17.0)
MCHC: 34.3 g/dL (ref 30.0–36.0)
MCV: 101.5 fL — ABNORMAL HIGH (ref 78.0–100.0)
Platelets: 118 10*3/uL — ABNORMAL LOW (ref 150–400)
RBC: 4.14 MIL/uL — ABNORMAL LOW (ref 4.22–5.81)
RDW: 14.1 % (ref 11.5–15.5)
WBC: 11.4 10*3/uL — ABNORMAL HIGH (ref 4.0–10.5)

## 2011-03-06 LAB — CARDIAC PANEL(CRET KIN+CKTOT+MB+TROPI)
Relative Index: INVALID (ref 0.0–2.5)
Total CK: 77 U/L (ref 7–232)
Troponin I: 0.06 ng/mL (ref 0.00–0.06)

## 2011-03-06 LAB — POCT CARDIAC MARKERS
Myoglobin, poc: 322 ng/mL (ref 12–200)
Myoglobin, poc: 348 ng/mL (ref 12–200)
Troponin i, poc: <0.05 ng/mL (ref 0.00–0.09)

## 2011-03-06 LAB — BASIC METABOLIC PANEL
Calcium: 8.4 mg/dL (ref 8.4–10.5)
Creatinine, Ser: 7.93 mg/dL — ABNORMAL HIGH (ref 0.4–1.5)
GFR calc Af Amer: 8 mL/min — ABNORMAL LOW (ref >60)
Sodium: 139 mEq/L (ref 135–145)

## 2011-03-06 LAB — LIPID PANEL
LDL Cholesterol: 45 mg/dL (ref 0–99)
VLDL: 28 mg/dL (ref 0–40)

## 2011-03-06 LAB — PROTIME-INR: Prothrombin Time: 15.3 seconds — ABNORMAL HIGH (ref 11.6–15.2)

## 2011-03-06 LAB — PHOSPHORUS: Phosphorus: 5.5 mg/dL — ABNORMAL HIGH (ref 2.3–4.6)

## 2011-03-10 ENCOUNTER — Other Ambulatory Visit (HOSPITAL_COMMUNITY): Payer: Self-pay | Admitting: Nephrology

## 2011-03-10 DIAGNOSIS — N186 End stage renal disease: Secondary | ICD-10-CM

## 2011-03-13 LAB — POCT I-STAT 4, (NA,K, GLUC, HGB,HCT)
Glucose, Bld: 85 mg/dL (ref 70–99)
HCT: 42 % (ref 39.0–52.0)
Sodium: 138 mEq/L (ref 135–145)

## 2011-03-20 ENCOUNTER — Ambulatory Visit (INDEPENDENT_AMBULATORY_CARE_PROVIDER_SITE_OTHER): Payer: Medicare Other | Admitting: Gastroenterology

## 2011-03-20 ENCOUNTER — Encounter: Payer: Self-pay | Admitting: Gastroenterology

## 2011-03-20 VITALS — BP 179/78 | HR 78 | Temp 97.8°F | Ht 68.0 in | Wt 193.0 lb

## 2011-03-20 DIAGNOSIS — D696 Thrombocytopenia, unspecified: Secondary | ICD-10-CM

## 2011-03-20 DIAGNOSIS — K2970 Gastritis, unspecified, without bleeding: Secondary | ICD-10-CM

## 2011-03-20 DIAGNOSIS — D649 Anemia, unspecified: Secondary | ICD-10-CM

## 2011-03-20 DIAGNOSIS — K2990 Gastroduodenitis, unspecified, without bleeding: Secondary | ICD-10-CM | POA: Insufficient documentation

## 2011-03-20 DIAGNOSIS — D61818 Other pancytopenia: Secondary | ICD-10-CM

## 2011-03-20 NOTE — Patient Instructions (Signed)
Please take your Prevacid every day. Please call if you have persistent abdominal pain. Please have your abdominal ultrasound done as scheduled. We will review your bloodwork in six weeks and make further recommendations.

## 2011-03-21 ENCOUNTER — Encounter: Payer: Self-pay | Admitting: Gastroenterology

## 2011-03-21 LAB — HEPATIC FUNCTION PANEL
Albumin: 4.3
Alkaline Phosphatase: 103 U/L

## 2011-03-21 LAB — ANEMIA PANEL
Iron Saturation: 37
Iron: 107

## 2011-03-21 LAB — CBC: Hemoglobin: 11.3 g/dL — AB (ref 13.5–17.5)

## 2011-03-21 NOTE — Assessment & Plan Note (Signed)
Continue Prevacid as before.

## 2011-03-21 NOTE — Assessment & Plan Note (Signed)
Anemia with Hemoccult-positive stool. Workup as outlined above. Hemoglobin has been stable per patient report. We'll follow labs done in 6 weeks.

## 2011-03-21 NOTE — Assessment & Plan Note (Signed)
Pancytopenia involving all cell lines. Etiology unclear. No significant risk factors for cirrhosis, however we'll check abdominal ultrasound for evaluation. Will follow his lab work done through the dialysis center.

## 2011-03-21 NOTE — Progress Notes (Signed)
Cc to PCP 

## 2011-03-21 NOTE — Assessment & Plan Note (Signed)
Possibly related to aspirin use. H. pylori negative on biopsy. Would simply minimize NSAID use but may continue aspirin for now. Continue Prevacid.

## 2011-03-21 NOTE — Progress Notes (Signed)
Primary Care Physician: [name]., MD  Primary Gastroenterologist:  Dr. Roetta Sessions  Chief Complaint  Patient presents with  . Follow-up    doing ok    HPI: Andrew [name] is a 77 y.o. male here for followup visit. He has a history of anemia and Hemoccult-positive stool. Recently had EGD and colonoscopy. Colonoscopy showed prominent vascular pattern but no discrete AVM or hemangioma, scattered pancolonic diverticula. EGD showed a couple of tiny distal esophageal erosions, small hiatal hernia, antral and bulbar erosions with benign biopsies. Patient presents saying that he is feeling well. He denies any constipation, diarrhea, abdominal pain, vomiting, heartburn, weight loss, melena, rectal bleeding. Labs were done on 03/16/2011. His white blood cell count was slightly low at 4640. His hemoglobin was 11.3, hematocrit 33.9, MCV 102, platelets 113,000, alkaline phosphatase 103, albumin 4.3, ferritin 212, iron 107, iron saturation is 37%.   Current Outpatient Prescriptions  Medication Sig Dispense Refill  . allopurinol (ZYLOPRIM) 100 MG tablet Take 100 mg by mouth daily.        Marland Kitchen aspirin 81 MG tablet Take 81 mg by mouth daily.        Marland Kitchen atorvastatin (LIPITOR) 10 MG tablet Take 10 mg by mouth daily.        . B Complex-C-Zn-Folic Acid (DIALYVITE 800/ZINC PO) Take 1 tablet by mouth 1 dose over 46 hours.        . cinacalcet (SENSIPAR) 30 MG tablet Take 30 mg by mouth daily.        . colchicine 0.6 MG tablet Take 0.6 mg by mouth daily. TAKE 2 TABS DAILY       . isosorbide mononitrate (IMDUR) 30 MG CR tablet Take 30 mg by mouth every morning.        . lansoprazole (PREVACID) 30 MG capsule Take 30 mg by mouth daily.        . polyethylene glycol powder (MIRALAX) powder Take 17 g by mouth daily.        . sevelamer (RENAGEL) 800 MG tablet Take 800 mg by mouth as directed.        Marland Kitchen UNKNOWN TO PATIENT Nypro, drink for dialysis patients         Allergies as of 03/20/2011  . (No Known Allergies)      ROS:  General: Negative for anorexia, weight loss, fever, chills, fatigue, weakness. ENT: Negative for hoarseness, difficulty swallowing , nasal congestion. CV: Negative for chest pain, angina, palpitations, dyspnea on exertion, peripheral edema.  Respiratory: Negative for dyspnea at rest, dyspnea on exertion, cough, sputum, wheezing.  GI: See history of present illness. GU:  Negative for dysuria, hematuria, urinary incontinence, urinary frequency, nocturnal urination.  Endo: Negative for unusual weight change.    Physical Examination:   BP 179/78  Pulse 78  Temp(Src) 97.8 F (36.6 C) (Tympanic)  Ht 5\' 8"  (1.727 m)  Wt 193 lb (87.544 kg)  BMI 29.35 kg/m2  General: Well-nourished, well-developed in no acute distress.  Eyes: No icterus. Mouth: Oropharyngeal mucosa moist and pink , no lesions erythema or exudate. Lungs: Clear to auscultation bilaterally.  Heart: Regular rate and rhythm, no murmurs rubs or gallops.  Abdomen: Bowel sounds are normal, nontender, nondistended, no hepatosplenomegaly or masses, no abdominal bruits or hernia , no rebound or guarding.   Extremities: No lower extremity edema.  Neuro: Alert and oriented x 4   Skin: Warm and dry, no jaundice.   Psych: Alert and cooperative, normal mood and affect.

## 2011-03-23 ENCOUNTER — Other Ambulatory Visit (HOSPITAL_COMMUNITY): Payer: Self-pay | Admitting: Nephrology

## 2011-03-23 ENCOUNTER — Ambulatory Visit (HOSPITAL_COMMUNITY)
Admission: RE | Admit: 2011-03-23 | Discharge: 2011-03-23 | Disposition: A | Discharge status: Home / Self Care | Payer: Medicare Other | Source: Ambulatory Visit | Attending: Gastroenterology | Admitting: Gastroenterology

## 2011-03-23 ENCOUNTER — Ambulatory Visit (HOSPITAL_COMMUNITY)
Admission: RE | Admit: 2011-03-23 | Discharge: 2011-03-23 | Disposition: A | Discharge status: Home / Self Care | Payer: Medicare Other | Source: Ambulatory Visit | Attending: Nephrology | Admitting: Nephrology

## 2011-03-23 DIAGNOSIS — M109 Gout, unspecified: Secondary | ICD-10-CM | POA: Insufficient documentation

## 2011-03-23 DIAGNOSIS — Z8673 Personal history of transient ischemic attack (TIA), and cerebral infarction without residual deficits: Secondary | ICD-10-CM | POA: Insufficient documentation

## 2011-03-23 DIAGNOSIS — Y832 Surgical operation with anastomosis, bypass or graft as the cause of abnormal reaction of the patient, or of later complication, without mention of misadventure at the time of the procedure: Secondary | ICD-10-CM | POA: Insufficient documentation

## 2011-03-23 DIAGNOSIS — N186 End stage renal disease: Secondary | ICD-10-CM

## 2011-03-23 DIAGNOSIS — T82898A Other specified complication of vascular prosthetic devices, implants and grafts, initial encounter: Secondary | ICD-10-CM | POA: Insufficient documentation

## 2011-03-23 DIAGNOSIS — D696 Thrombocytopenia, unspecified: Secondary | ICD-10-CM | POA: Insufficient documentation

## 2011-03-23 DIAGNOSIS — I120 Hypertensive chronic kidney disease with stage 5 chronic kidney disease or end stage renal disease: Secondary | ICD-10-CM | POA: Insufficient documentation

## 2011-03-23 DIAGNOSIS — Z8551 Personal history of malignant neoplasm of bladder: Secondary | ICD-10-CM | POA: Insufficient documentation

## 2011-03-23 DIAGNOSIS — I509 Heart failure, unspecified: Secondary | ICD-10-CM | POA: Insufficient documentation

## 2011-03-23 DIAGNOSIS — I868 Varicose veins of other specified sites: Secondary | ICD-10-CM | POA: Insufficient documentation

## 2011-03-23 DIAGNOSIS — K7689 Other specified diseases of liver: Secondary | ICD-10-CM | POA: Insufficient documentation

## 2011-03-23 DIAGNOSIS — N2581 Secondary hyperparathyroidism of renal origin: Secondary | ICD-10-CM | POA: Insufficient documentation

## 2011-03-23 DIAGNOSIS — K8020 Calculus of gallbladder without cholecystitis without obstruction: Secondary | ICD-10-CM | POA: Insufficient documentation

## 2011-03-23 LAB — COMPREHENSIVE METABOLIC PANEL
Alkaline Phosphatase: 103 U/L
Creatine, Serum: 10.3
Ferritin: 212 ng/mL (ref 18.0–300.0)
Iron: 107
Potassium: 6 mmol/L

## 2011-03-23 MED ORDER — IOHEXOL 300 MG/ML  SOLN
100.0000 mL | Freq: Once | INTRAMUSCULAR | Status: AC | PRN
  Administered 2011-03-23: 50 mL via INTRAVENOUS

## 2011-03-28 ENCOUNTER — Encounter: Payer: Self-pay | Admitting: Gastroenterology

## 2011-03-28 DIAGNOSIS — K8020 Calculus of gallbladder without cholecystitis without obstruction: Secondary | ICD-10-CM

## 2011-03-28 HISTORY — DX: Calculus of gallbladder without cholecystitis without obstruction: K80.20

## 2011-03-29 ENCOUNTER — Other Ambulatory Visit: Payer: Self-pay

## 2011-03-29 DIAGNOSIS — D649 Anemia, unspecified: Secondary | ICD-10-CM

## 2011-04-11 ENCOUNTER — Encounter (INDEPENDENT_AMBULATORY_CARE_PROVIDER_SITE_OTHER): Payer: Medicare Other | Admitting: Vascular Surgery

## 2011-04-11 DIAGNOSIS — N186 End stage renal disease: Secondary | ICD-10-CM

## 2011-04-11 NOTE — Op Note (Signed)
NAME:  Andrew [name], AMISON NO.:  [id number]   MEDICAL RECORD NO.:  [id number]          PATIENT TYPE:  AMB   LOCATION:  SDS                          FACILITY:  [facility]   PHYSICIAN:  [name], M.D.    DATE OF BIRTH:  Oct 23, 1926   DATE OF PROCEDURE:  07/23/2007  DATE OF DISCHARGE:  07/23/2007                               OPERATIVE REPORT   PREOPERATIVE DIAGNOSIS:  End-stage renal disease with poorly functioning  right upper arm arteriovenous fistula.   POSTOPERATIVE DIAGNOSIS:  End-stage renal disease with poorly  functioning right upper arm arteriovenous fistula.   PROCEDURE:  Revision of arteriovenous anastomosis of right upper arm  arteriovenous Gore-Tex graft.   SURGEON:  [name], M.D.   ASSISTANT:  Nurse.   ANESTHESIA:  MAC.   COMPLICATIONS:  None.   DISPOSITION:  Recovery room stable.   INDICATIONS:  The patient is a 77 year old gentleman with hemodialysis  via right upper arm AV fistula.  He had ad a recent fistulogram showing  stenosis in the proximal one half of his cephalic vein above the elbow.  He had angioplasty of this.  He continued to have difficulty with flow,  was seen by Dr. Liliane Bade in our office at [facility] and had a duplex showing severe elevated velocities at the  AV anastomosis.  It is recommended he undergo revision of his  arteriovenous anastomosis and he was taken to the operating room at this  time.   PROCEDURE IN DETAIL:  The patient was taken to operating room, placed in  supine position where the right arm prepped and draped in usual sterile  fashion.  Incision was made using local anesthesia over the AV  anastomosis, carried down to isolate the cephalic vein to brachial  artery.  The artery was occluded proximal and distally and the old  anastomosis was taken down.  There was a stenosis at the AV anastomosis  and the vein was spatulated and mobilized to allow for reanastomosis.  The  arteriotomy was extended proximally and the vein was resewn to the  artery with a running 6-0 Prolene suture.  Clamps were removed and good  thrill was noted.  Wounds irrigated with saline.  Hemostasis obtained  with electrocautery.  Wounds were closed with 3-0 Vicryl in the  subcutaneous and subcu tissue.  Benzoin and Steri-Strips were applied.     [name], M.D.  Electronically Signed    TFE/MEDQ  D:  07/23/2007  T:  07/24/2007  Job:  [id number]

## 2011-04-11 NOTE — Consult Note (Signed)
NAME:  Andrew [name], Andrew [name]                ACCOUNT NO.:  [id number]   MEDICAL RECORD NO.:  [id number]          PATIENT TYPE:  INP   LOCATION:  A334                          FACILITY:  [facility]   PHYSICIAN:  Jorja Loa, M.D.DATE OF BIRTH:  June 10, 1926   DATE OF CONSULTATION:  05/03/2009  DATE OF DISCHARGE:                                 CONSULTATION   REASON FOR CONSULTATION:  Hyperkalemia, end-stage renal disease.   PAST MEDICAL HISTORY:  Coronary artery disease, status post CABG,  history of hypertension, history of end-stage renal disease on  maintenance hemodialysis Monday, Wednesday, Friday, and history of also  CHF.  Presently, he came to the emergency room with complaints of  shortness of breath and also chest pain.  Since the patient was found to  have congestive heart failure, hyperkalemia admitted for further workup  and followup.  At this moment, since the patient is supposed to be  dialyzed today and also he has hyperkalemia, consult is called.  The  patient at this moment denies any nausea, no vomiting.  However, he  states that he has orthopnea and also paroxysmal nocturnal dyspnea.  Before he came to the hospital and before we put him on oxygen, he was  having very difficult time, but presently he feels somewhat better, but  still he has further complaints.  The patient has been admitted  previously with this problem.  He denies any fevers, chills, or  sweating.  He denies any cough.  The chest pain seems to be pleuritic  and is exacerbated with inspiration.   PAST MEDICAL HISTORY:  He has history of coronary artery, status post  CABG, history of hypertension, history of end-stage renal disease on  maintenance hemodialysis, history of hyperlipidemia, history of CHF,  history of CVA, history of transitional cell CA of the bladder, history  of severe hearing problem, history of renal artery stenosis, history of  also questionable renal CA.   His medications consist of:  1.  Metoprolol 50 mg p.o. b.i.d.  2. Allopurinol 100 mg p.o. daily.  3. Isosorbide 30 mg p.o. daily.  4. Dialyvite 1 tablet p.o. daily.  5. Aspirin 81 mg p.o. daily.  6. Renagel 4 tablets p.o. t.i.d. with meals, 2 with snacks.  7. Sensipar 70 mg p.o. daily.  8. He is also on nitro drip presently.   SOCIAL HISTORY:  He is ex-smoker.  No history of alcohol abuse or drug  use.   FAMILY HISTORY:  No history of renal failure.   REVIEW OF SYSTEMS:  His main complaint seems to be orthopnea associated  with paroxysmal nocturnal dyspnea.  He denies any fever, cough, chills.  He has also pleuritic type of chest pain usually exacerbated with  inspiration and it looks like mid epigastric area.  He does not have any  nausea or vomiting.  Appetite is okay.   PHYSICAL EXAMINATION:  VITAL SIGNS:  His blood pressure is 141/72,  febrile.  His heart rate is 90.  HEENT:  No conjunctival pallor.  No icterus.  Oral mucosa seems to be  moist.  CHEST:  He  has some expiratory crackles and also some expiratory  wheezing.  HEART:  Irregular rate and rhythm.  No murmur.  ABDOMEN:  Soft.  Positive bowel sounds.  Nontender.  No palpable  organomegaly.  EXTREMITIES:  He has had trace 1+ edema.   His white blood cell count is 11.4, hemoglobin 40.4, hematocrit 72.2,  platelets of 220.  His potassium is 6.3.  Sodium 159, BUN is 55,  creatinine 11.8.  CPK is 2.7.  His x-ray showed he has some mild edema,  but bilateral pleural effusion.   ASSESSMENT:  1. End-stage, he is status post hemodialysis on Friday.  Presently,      his BUN and creatinine is in the acceptable range.  He has high      potassium.  He is due for dialysis today.  2. History of congestive heart failure.  He has some sign of fluid      overload.  He has also pleural effusion, probably longstanding.  3. History of coronary artery disease, status post coronary artery      bypass graft is asymptomatic.  He has some chest pain, but looks       pleuritic.  His cardiac enzymes are normal.  4. History of hypertension, blood pressure is slightly high, but      overall stable.  5. History of hyperlipidemia.  6. History of cerebrovascular accident, remote.  7. History of transitional cell carcinoma of the bladder.  8. History of ear ache.  9. History of renal artery stenosis.  10.History of gout.  He is on allopurinol.   RECOMMENDATIONS:  I will make arrangement for the patient to get  dialysis.  At this moment, __________.  We will continue the binder and  other medication and will follow the patient.       Jorja Loa, M.D.  Electronically Signed     BB/MEDQ  D:  05/03/2009  T:  05/04/2009  Job:  [id number]

## 2011-04-11 NOTE — H&P (Signed)
NAME:  Andrew [name], Andrew [name]                ACCOUNT NO.:  [id number]   MEDICAL RECORD NO.:  [id number]          PATIENT TYPE:  INP   LOCATION:  A334                          FACILITY:  [facility]   PHYSICIAN:  Lonia Blood, M.D.      DATE OF BIRTH:  1925/12/03   DATE OF ADMISSION:  05/03/2009  DATE OF DISCHARGE:  LH                              HISTORY & PHYSICAL   PRIMARY CARE PHYSICIAN:  The patient is unassigned to [place].   PRESENTING COMPLAINT:  Shortness of breath.   HISTORY OF PRESENT ILLNESS:  The patient is an 77 year old gentleman  with known history of end-stage renal disease on hemodialysis whose  dialysis was meant for today.  The patient apparently started having  worsening shortness of breath.  He could not contain himself.  Hence, he  decided to come to the emergency room.  In the emergency room, the  patient complained of some chest pain.  He has a past history of  coronary artery disease.  Hence, he was admitted for further management.   PAST MEDICAL HISTORY:  1. Coronary artery disease.  2. Kidney cancer with renal failure, end-stage renal disease on      hemodialysis.  3. Status post CABG.  4. Status post nephrectomy.  5. History of gout.  6. Dyslipidemia.  7. Hypertension.  8. Peripheral vascular disease.  9. Secondary hyperparathyroidism.  10.History of renal artery stenosis status post PTCA of the left renal      artery.  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 Vonita Moss, M.D.  12.His CABG was performed in 1998 and was four vessels.   ALLERGIES:  NO KNOWN DRUG ALLERGIES.   MEDICATIONS:  Include:  1. Lipitor 10 mg p.o. nightly.  2. Prevacid 30 mg nightly.  3. Metoprolol 50 mg b.i.d.  4. Allopurinol 100 mg two tablets daily.  5. Hydrocodone 5/500 one tablet q.4h. p.r.n.  6. Sensipar 30 mg daily.  7. Isosorbide mononitrate 30 mg daily.  8. Colchicine 0.6 mg b.i.d.  9. Ecotrin 81 mg daily.  10.He also  takes Dialyvite one tablet daily.   SOCIAL HISTORY:  The patient lives in [place].  Denied any tobacco,  alcohol or IV drug use.   FAMILY HISTORY:  Not significant.   REVIEW OF SYSTEMS:  Total review of systems negative, except per HPI.   PHYSICAL EXAMINATION:  Today, the patient is afebrile with a temperature  of 98.1, blood pressure initially 218/99, pulse 84, respiratory rate 36  with a SAT 100% on 2 L.  GENERAL:  The patient is awake, alert, seems to be slightly in  respiratory distress, but not acute, but otherwise stable.  HEENT:  PERRL.  EOMI.  NECK:  Supple.  No JVD.  No lymphadenopathy.  RESPIRATORY:  The patient has decreased air entry bilaterally with extra  efforts with widespread crackles at the bases bilaterally up to mid  lung.  CARDIOVASCULAR SYSTEM:  S1 and S2.  No murmur.  ABDOMEN:  Soft, nontender with positive bowel sounds.  EXTREMITIES:  Shows stress-induced edema.   LABS:  White count 11.4, hemoglobin 14.4, platelet count 228.  Initial  cardiac enzymes showed elevated myoglobin, but troponin negative.  Sodium 139, potassium 3.6, chloride 102, CO2 of 26, glucose 140, BUN 55,  creatinine 11.58 and the rest of the LFTs essentially normal.  His chest  x-ray showed mild edema and bilateral pleural effusions.  Viral  infection could not be excluded.  EKG showed no significant change.   ASSESSMENT:  Therefore, this is an 77 year old gentleman on hemodialysis  presenting with acute pulmonary edema and hyperkalemia.  Other findings,  some chest pain.   PLAN:  Therefore will be:  1. Acute pulmonary edema.  We will admit the patient and get      nephrology to consult on the patient for an emergent hemodialysis.      I believe with hemodialysis we will be able to get loss of fluid      off quickly and then reassess the patient thereafter.  2. Hyperkalemia.  Again, the patient will get an emergent dialysis.  I      do believe this is related to his end-stage renal  disease.  3. Coronary artery disease.  The patient has vague chest pain, but I      will believe this is due to his pulmonary edema.  We will cycle his      cardiac enzymes, however and follow closely.  If they are positive      effectively we will get cardiac consult.  4. Hypertension.  The patient has currently been started on nitro      drip.  We will continue with that, but hopefully after his      hemodialysis his blood pressure should improve.  5. Dyslipidemia.  I will continue with his fasting lipid panel was no      specific change.  6. Otherwise, the patient will be admitted mainly overnight and will      be discharged as soon as he is stable.      Lonia Blood, M.D.  Electronically Signed    LG/MEDQ  D:  05/04/2009  T:  05/04/2009  Job:  [id number]

## 2011-04-11 NOTE — Op Note (Signed)
NAME:  Andrew [name], Andrew [name] NO.:  [id number]   MEDICAL RECORD NO.:  [id number]          PATIENT TYPE:  AMB   LOCATION:  NESC                         FACILITY:  [facility]   PHYSICIAN:  Maretta Bees. Vonita Moss, M.D.DATE OF BIRTH:  07/07/1926   DATE OF PROCEDURE:  07/21/2008  DATE OF DISCHARGE:                               OPERATIVE REPORT   PREOPERATIVE DIAGNOSIS:  History of right upper tract transitional cell  carcinoma.   POSTOPERATIVE DIAGNOSIS:  History of right upper tract transitional cell  carcinoma.   PROCEDURE:  1. Cystourethroscopy, flexible and rigid.  2. Bladder biopsies.  3. Fulguration of bleeders and residual tumors.   ATTENDING PHYSICIAN:  Maretta Bees. Vonita Moss, M.D.   RESIDENT PHYSICIAN:  Maudie Flakes, M.D.   ANESTHESIA:  MAC.   INDICATIONS FOR PROCEDURE:  Andrew [name] is an 77 year old white male with  past medical history positive for end-stage renal disease who is status  post right nephroureterectomy for upper tract TCC.  He has been  evaluated in the past with serial cystoscopy and cytologies, however he  does not tolerate office cystoscopy very well.  Subsequently he has been  recommended to come to the operating room for surveillance cystoscopy.  His left ureteral orifice has been difficult to identify in the past and  if identifiable we will perform left retrograde pyelogram today as well.  Preoperatively risks, benefits, consequences and concerns were discussed  with the patient and informed consent was obtained.   PROCEDURE IN DETAIL:  The patient was brought to the operating room and  placed in supine position.  He was correctly identified by his wristband  and appropriate time-out was taken.  IV antibiotics were administered  and MAC anesthesia was delivered.  Once adequately anesthetized, he was  placed in dorsal lithotomy position with great care taken to minimize  the risk of peripheral neuropathy or compartment syndrome.  His perineum  was  prepped and draped sterilely.  We injected 1 vial of viscous  lidocaine per urethra to help with his anesthesia.  We then performed  rigid cystourethroscopy with the 22-French rigid cystoscopic sheath and  a combination of 12-degree, 30-degree and 70-degree lenses with sterile  water as our irrigant.  His urethra was normal in course and caliber.  Upon entry into the bladder clear urine was identified.  The right  ureteral orifice has been surgically removed, the left ureteral orifice  was not identifiable with all 3 lenses.  His bladder was somewhat small  volume without significant diverticula, there are no foreign bodies or  stones seen.  There were multiple bladder lesions that were papillary in  nature and appeared consistent with low-grade TCC, the largest of which  was just greater than a centimeter, it was located in the right  posterior bladder wall.  There was a small 1 to 2-mm lesion just lateral  to this as well.  Inspection of the dome of his bladder was only  possible using a 70-degree lens and there we appreciated at least 5 sub-  centimeter papillary lesions consistent with the posterior bladder wall  lesions.  We then used cold cup biopsy forceps to biopsy the largest  mass and posterior bladder wall and sent these specimens off to  pathology.  We used the Bugbee electrode to fulgurate the entire base of  this tumor and all bleeding sites.  We also incorporated the smaller  tumor with our fulguration here.  We attempted to get biopsies from the  tumors at the dome but because of their location we were unable to do  so.  Subsequently we used the Albarran bridge with the Bugbee electrode  and the 70-degree lens and fulgurated all of these sites.  We then  removed the rigid cystoscope and used a flexible cystoscope to closer  inspect the apex.  There we found a couple of other smaller 5 to 7-mm  tumors that were not seen using the 70-degree lens.  We also fulgurated  these  with the Bugbee electrode.  One final close inspection of the  urothelium did not demonstrate any further urothelial lesions.  All  fulguration sites were hemostatic.  There was no active bleeding.  Subsequently his bladder was drained, the cystoscope was removed, and  this marked the end of our procedure.  He tolerated the procedure well.  There were no complications.  He awoke from anesthesia and was taken to  the recovery room in stable condition.  Dr. Vonita Moss was present and  participated in all aspects of the case.     ______________________________  Maudie Flakes, M.D.      Maretta Bees. Vonita Moss, M.D.  Electronically Signed    DW/MEDQ  D:  07/21/2008  T:  07/22/2008  Job:  [id number]

## 2011-04-11 NOTE — Assessment & Plan Note (Signed)
[facility]                       [place] [facility] OFFICE NOTE   NAME:Andrew [name], Andrew [name]                       MRN:          [id number]  DATE:02/23/2009                            DOB:          1926/10/17    Mr. Thieme comes in today for a followup.   He saw Dr. Britt Bottom C. Powell [facility] recently, who found his  cholesterol to be up to 195.  His triglycerides are 294.  In the past,  he had been on Lipitor, but had read some things on the Internet that it  caused aching in the joints.  He stopped it on his own.  At that time,  his cholesterol was down close to 100.   He has a history of coronary artery disease, status post coronary bypass  grafting in 1998.  His last catheterization was in 2004, which showed  widely patent grafts.  He has normal left ventricular systolic function  by last objective assessment.   He also has a history of carotid artery disease.  He had a recent  problem with a right eye droop about a year ago and was seen by a  neurologist at [facility].  I do not have any records.  He  describes having some sort of angiogram of his head and has been treated  medically.  He is having no symptoms of TIAs.   He denies any angina or ischemic symptoms.  He remains on dialysis even  at the age of 41.   CURRENT MEDICATIONS:  1. Metoprolol 50 mg p.o. b.i.d. which he holds on the morning of his      dialysis.  2. Metanx 2 mg nightly.  3. Allopurinol 100 mg a day.  4. Tums.  5. Isosorbide mononitrate 30 mg per day.  6. Dialyvite 1 daily.  7. Aspirin 81 mg per day.  8. Sensipar 30 mg p.o. daily.  9. Renagel 800 mg 2 with snacks and 4 with meals.   PHYSICAL EXAMINATION:  VITAL SIGNS:  Today, his blood pressure is  126/62, his pulse is 72 and regular, his weight is 191, down 6 from his  last visit here in 2007.  HEENT:  Unremarkable other than a right eye droop.  The rest of the exam  is unremarkable.  NECK:  Carotid  upstrokes were equal bilaterally with bilateral bruits.  Thyroid is not enlarged.  Trachea is midline.  LUNGS:  Clear to auscultation and percussion.  HEART:  A nondisplaced PMI.  He has normal S1 and S2.  No murmur, rub,  or gallop.  ABDOMEN:  Soft.  Good bowel sounds.  No pulsatile mass.  No midline  bruit.  EXTREMITIES:  No cyanosis, clubbing, or edema.  Pulses were 2+/4+  dorsalis pedis and posterior tibial.  He has cool toes with decreased  capillary reflex.  There is no sign of DVT.  NEUROLOGIC:  Intact.  SKIN:  A few ecchymoses.   Looking back into the chart, his last 2-D echocardiogram was in 2006.  This showed overall normal left ventricular function, mild sclerosis of  an aortic valve, no significant  mitral regurgitation, mild left  ventricular hypertrophy, mild left atrial enlargement, and normal right-  sided structures and function.   EKG demonstrates sinus bradycardia of 59, normal otherwise.   ASSESSMENT AND PLAN:  Mr. Quant is doing remarkably well considering his  burden of vascular disease, not to mention being on chronic dialysis.  I  think low-dose Lipitor is indicated 10 mg per day.  We will followup  blood work in 6 weeks.   At the present time, I see nothing that warrants objective assessment of  his coronary artery disease and/or his cerebrovascular disease.  The  latter has been evaluated in the last year, so at [facility].   I will plan on seeing him back in 6 months.     Thomas C. Daleen Squibb, MD, [facility]  Electronically Signed    TCW/MedQ  DD: 02/23/2009  DT: 02/24/2009  Job #: [id number]   cc:   Mindi Slicker. Lowell Guitar, M.D.  Patrica Duel, M.D.

## 2011-04-11 NOTE — Op Note (Signed)
NAME:  BOOKER, BHATNAGAR NO.:  [id number]   MEDICAL RECORD NO.:  [id number]          PATIENT TYPE:  AMB   LOCATION:  NESC                         FACILITY:  [facility]   PHYSICIAN:  Maretta Bees. Vonita Moss, M.D.DATE OF BIRTH:  September 15, 1926   DATE OF PROCEDURE:  09/17/2007  DATE OF DISCHARGE:                               OPERATIVE REPORT   PREOPERATIVE DIAGNOSIS:  Recurrent bladder carcinoma.   POSTOPERATIVE DIAGNOSIS:  Recurrent bladder carcinoma.   OPERATION PERFORMED:  Cystoscopy and resection of bladder tumor.   SURGEON:  Maretta Bees. Vonita Moss, M.D.   ANESTHESIA:  General.   INDICATIONS FOR PROCEDURE:  This 77 year old patient who is on dialysis  has a history of bladder carcinoma going back several years and also has  had a right nephroureterectomy for urothelial carcinoma, recent  surveillance cystoscopy noted a papillary tumor.  He is brought to the  operating room today for further evaluation and treatment.   DESCRIPTION OF PROCEDURE:  The patient was brought to the operating room  and placed in lithotomy position.  After the induction of satisfactory  general anesthesia, the external genitalia were prepped and draped in  the usual fashion.  He was cystoscoped.  The anterior urethra and  prostatic urethra were unremarkable.  The only lesion in the bladder was  a 1.5 cm tumor on the dome.  The rest of the bladder was unremarkable.  Unfortunately, I could not identify his left ureteral orifice with the  thought of doing a retrograde pyelogram as a matter of routine.  My  suspicion for upper tract tumors is not all that high.   The 1.5 cm tumor on the dome was resected completely with the cold cup  bladder biopsy forceps and the biopsy site and surrounding mucosa  thoroughly fulgurated with the Bugbee electrode.  The blood loss was 0.  Bladder was emptied, scope removed.  Patient sent to recovery room in  good condition having tolerated the procedure well.      Maretta Bees. Vonita Moss, M.D.  Electronically Signed     LJP/MEDQ  D:  09/17/2007  T:  09/18/2007  Job:  [id number]

## 2011-04-11 NOTE — Assessment & Plan Note (Signed)
OFFICE VISIT   Andrew [name], Andrew [name]  DOB:  22-Aug-1926                                       12/03/2007  XBJYN#:[id number]   I saw the patient in the office today concerning a poorly functioning  right upper arm AV fistula.  He had creation of a right upper arm AV  fistula on 10/30/06.  He subsequently requiring ligation of a competing  branch of the AV fistula in April, 2008.  Subsequently in August, he had  developed some narrowing at the arterial end, and this fistula was  revised again.  Specifically, the stenosis at the arterial anastomosis  was revised.  He comes in today because they have had intermittent  problems with cannulating his fistula.  This is what they are currently  using for dialysis.  He denies any problems with pain or excessive  bleeding associated with the use of his fistula.   PHYSICAL EXAMINATION:  This is a pleasant 77 year old gentleman who  appears his stated age.  Blood pressure is 119/63.  Heart rate is 71.  The fistula is pulsatile and does not have a very good thrill.  The  fistula appears somewhat tortuous proximally, and in the upper arm near  the shoulder, the vein is fairly small and pulsatile.   I have explained that I think there are multiple problems potentially  with his fistula:  1)  It is fairly tortuous.  2)  The segment in the  upper part of the upper arm is fairly small.  3)  I think he likely has  an outflow stenosis.   However, before giving up on this fistula, I think it would be worth  obtaining a fistulogram to see if there might be something which could  be ballooned to help improve the performance of the fistula or allow the  upper segment to enlarge some.  I think a fistulogram will also help [place]  decide if it is time to consider a new access.  We will arrange this for  Dr. Myra Gianotti on a Tuesday.  The patient dialyzes typically Mondays,  Wednesdays, and Fridays.  I have discussed the procedure with him and  plan on seeing him back in the office after the procedure as needed.   Di Kindle. Edilia Bo, M.D.  Electronically Signed   CSD/MEDQ  D:  12/03/2007  T:  12/04/2007  Job:  [id number]

## 2011-04-11 NOTE — Op Note (Signed)
NAME:  Andrew [name], Andrew [name] NO.:  [id number]   MEDICAL RECORD NO.:  [id number]          PATIENT TYPE:  AMB   LOCATION:  NESC                         FACILITY:  [facility]   PHYSICIAN:  Maretta Bees. Vonita Moss, M.D.DATE OF BIRTH:  1926/01/23   DATE OF PROCEDURE:  DATE OF DISCHARGE:                               OPERATIVE REPORT   PREOPERATIVE DIAGNOSIS:  History of bladder carcinoma.   POSTOPERATIVE DIAGNOSIS:  History of bladder carcinoma.   PROCEDURE:  Cystoscopy, cold cup bladder biopsy and fulguration of  bladder.   SURGEON:  Clent Ridges, MD   ANESTHESIA:  General.   INDICATIONS:  This gentleman had a right nephroureterectomy for  urothelial carcinoma in 2000.  He has also had bladder cancers in 2005,  2006 followed by therapy with BCG.  He also had treatment last fall with  6 weeks of Intron A and one-third BCG after finding multiple recurrent  bladder tumors last August.  He returns now for further evaluation.   PROCEDURE IN DETAIL:  The patient was brought to the operating room and  placed in lithotomy position.  External genitalia prepped and draped in  the usual fashion.  He was cystoscoped and fortunately there were no  papillary tumors.  There was just some minimal scattered erythema and  biopsies of these erythematous areas on the bladder base were obtained  with the cold cup bladder biopsy forceps and biopsy sites were  fulgurated with the Bugbee electrode.  The bladder was emptied, scope  removed.  The patient was sent to the recovery room in good condition  with no blood loss having tolerated the procedure well.      Maretta Bees. Vonita Moss, M.D.  Electronically Signed     LJP/MEDQ  D:  12/10/2008  T:  12/10/2008  Job:  [id number]

## 2011-04-11 NOTE — Op Note (Signed)
NAME:  Andrew [name], Andrew [name]                ACCOUNT NO.:  [id number]   MEDICAL RECORD NO.:  [id number]          PATIENT TYPE:  OIB   LOCATION:  2807                         FACILITY:  [facility]   PHYSICIAN:  Juleen China IV, MDDATE OF BIRTH:  January 11, 1926   DATE OF PROCEDURE:  12/10/2007  DATE OF DISCHARGE:                               OPERATIVE REPORT   PREOPERATIVE DIAGNOSIS:  Poorly functional right arm fistula.   POSTOPERATIVE DIAGNOSIS:  Clotted right arm fistula.   PROCEDURES PERFORMED:  1. Ultrasound-guided access, right internal jugular vein.  2. Right internal jugular vein temporary dialysis catheter.   FINDINGS:  Thrombosed right arm fistula.   PROCEDURE:  The patient was identified in the holding area and taken to  room #8.  He was placed supine on the table.  The right arm was prepped  and draped in the standard sterile fashion.  The right cephalic vein  fistula was evaluated with ultrasound and fresh thrombus was seen within  the fistula.  There was no palpable thrill within the fistula.  At this  point, I elected to not perform access to the fistula but rather place a  temporary dialysis catheter, so the patient could be dialyzed tonight.  He had had an infiltrate at dialysis yesterday.  His potassium was 6.7.   At this point, the right neck was prepped and draped in the usual  fashion.  The right internal jugular vein was evaluated with ultrasound  and found to be compressible and widely patent.  Next, 1% lidocaine was  used for local anesthesia.  The right internal jugular vein was accessed  with an 18-gauze needle.  An 0.035 wire was advanced under fluoroscopic  visualization into the heart.  The skin dilator was then placed over the  wire and a temporary dialysis catheter was placed.  The tip was located  at the caval-atrial junction.  The catheter was sutured into position.  Both ports were flushed and aspirated without difficulty.  Sterile  dressings were applied.   Appropriate volumes of heparin were placed into  the catheter.  The patient tolerated the procedure well.  There were no  complications.           ______________________________  V. Charlena Cross, MD  Electronically Signed     VWB/MEDQ  D:  12/10/2007  T:  12/10/2007  Job:  [id number]

## 2011-04-11 NOTE — Op Note (Signed)
NAME:  Andrew [name], MOODIE NO.:  [id number]   MEDICAL RECORD NO.:  [id number]          PATIENT TYPE:  AMB   LOCATION:  NESC                         FACILITY:  [facility]   PHYSICIAN:  Maretta Bees. Vonita Moss, M.D.DATE OF BIRTH:  1926-02-15   DATE OF PROCEDURE:  01/21/2008  DATE OF DISCHARGE:                               OPERATIVE REPORT   PREOPERATIVE DIAGNOSIS:  History of recurrent bladder carcinoma and  right nephroureterectomy for urothelial carcinoma.   POSTOPERATIVE DIAGNOSIS:  History of recurrent bladder carcinoma and  right nephroureterectomy for urothelial carcinoma.   PROCEDURE:  Cystoscopy and attempted left retrograde pyelogram and cold  cup bladder biopsy.   SURGEON:  Maretta Bees. Vonita Moss, M.D.   ANESTHESIA:  MAC and local.   INDICATIONS:  This 77 year old gentleman is on chronic dialysis and has  a history of bladder carcinoma and a right nephroureterectomy as noted  above.  He had a bladder tumor resected in October 2008.  He is brought  to the OR today for surveillance cystoscopy and possible left retrograde  pyelogram if I can see his orifice which has been difficult in the past.  He is also much more comfortable with some sedation and has a difficult  time with office cystoscopy.   PROCEDURE IN DETAIL:  The patient was brought to the operating room and  placed in the lithotomy position.  The external genitalia were prepped  and draped in the usual fashion.  He was cystoscoped and the anterior  urethra was unremarkable.  The prostate was unremarkable.  The bladder  had no papillary tumors or stones.  He had some erythematous areas  across the bladder base.  I took a long time trying to identify his left  ureteral orifice and thought I saw it and tried injection of contrast  with the cone-tip catheter but it just stayed in the bladder.  I then  used the cold cup bladder biopsy forceps and took biopsies in the  bladder base and fulgurated the biopsy sites  with the Bugbee electrode.  He was taken to the recovery room in good condition and with good  hemostasis and negligible blood loss.      Maretta Bees. Vonita Moss, M.D.  Electronically Signed     LJP/MEDQ  D:  01/21/2008  T:  01/21/2008  Job:  [id number]

## 2011-04-11 NOTE — Assessment & Plan Note (Signed)
OFFICE VISIT   Andrew [name], Andrew [name]  DOB:  04-Apr-1926                                       07/18/2007  ZOXWR#:[id number]   The patient is seen today for a poorly-functioning right upper arm  arteriovenous fistula.  This was initially placed by Dr. Arbie Cookey in  December 2007.  Revision carried out by Dr. Hart Rochester in April 2008.  He  has continued to have some poor dialysis runs.   A couple of weeks ago, had a balloon angioplasty of a stenosis in the  vein.  However, despite this, he has continued to have poor dialysis  runs.   Duplex of his fistula today reveals a stenosis at the arterial  anastomosis.  This will require revision.   BP is 132/70, pulse 66 per minute and regular, respirations 18 per  minute.  The fistula is patent.  There is some tortuosity.   I have scheduled the patient to undergo revision of the fistula at the  arteriovenous anastomosis on 07/23/2007 at [facility] to be  carried out by Dr. Darrick Penna.   Balinda Quails, M.D.  Electronically Signed   PGH/MEDQ  D:  07/18/2007  T:  07/20/2007  Job:  [id number]

## 2011-04-11 NOTE — Procedures (Signed)
VASCULAR LAB EXAM   INDICATION:  Evaluation of right upper arm AV fistula.   HISTORY:  The upper arm AV dialysis access fistula was originally placed on  October 30, 2006, by Dr. Arbie Cookey.  A competing branch was ligated on March 13, 2007, by Dr. Hart Rochester.  The fistula is still usable for dialysis  intermittently.   Diabetes:  No.  Cardiac:  Coronary artery bypass grafting.  Hypertension:  Yes, end-stage renal disease.   EXAM:  The AV fistula was evaluated by duplex ultrasound.  Systolic  velocities are noted on the affixed page.  There was a peak systolic  velocity of 711 cm/sec at the anastomosis of the cephalic vein to the  brachial artery.   IMPRESSION:  Stenosis at the anastomosis of the right cephalic vein to  the brachial artery.   ___________________________________________  P. Liliane Bade, M.D.   MC/MEDQ  D:  07/18/2007  T:  07/19/2007  Job:  [id number]

## 2011-04-11 NOTE — Assessment & Plan Note (Signed)
OFFICE VISIT   Andrew [name], Andrew [name]  DOB:  [birth year]                                       08/30/2009  ZOXWR#:[id number]   HISTORY:  This is an 77 year old gentleman with end-stage renal disease  dialyzing through a right upper arm fistula.  The arterial anastomosis  had been revised by Dr. Arbie Cookey.  The patient has had multiple  interventional procedures including a stent in his cephalic vein.  He is  sent to me because of increasing size in pseudoaneurysms on his fistula  as well as pain with dialysis.  He was intervened in radiology within  the last 3 months.   PHYSICAL EXAMINATION:  His blood pressure is not recorded today.  He is  afebrile.  There is a thrill within his fistula.  There are no  ulcerations.  There are 2 prominent pseudoaneurysms.   PLAN:  I reviewed the patient's fistulogram.  The vein is of marginal  caliber at this point in time.  There are multiple areas of stenosis as  well as existing stent.  These were all dilated with a very good result.  However, I am concerned that he may have had recurrence.  I think this  needs to be reevaluated.  I am going to set him up to have  interventional radiology do this since he is well known to them.  This  will be done within the next couple of weeks.   Jorge Ny, MD  Electronically Signed   VWB/MEDQ  D:  08/30/2009  T:  08/31/2009  Job:  [id number]   cc:   Jorja Loa, M.D.

## 2011-04-12 NOTE — Assessment & Plan Note (Signed)
OFFICE VISIT  Andrew [name], Andrew [name] DOB:  [birth year]                                       04/11/2011 VWUJW#:[id number]  Patient presents today for evaluation of right arm AV access.  I have reviewed his shuntograms dating back to 2009 and also his prior surgical revisions and discussed this by telephone with Dr. Roylene Reason.  The concern is regarding venous aneurysms over his fistula.  This initial upper arm AV fistula was placed by myself in 2007.  He had multiple recent angioplasties for high venous pressures.  In reviewing his films, he had placement of a stent at the junction of the cephalic vein into the subclavian vein in November 2009 and approximately every 4 months has had dilatation angioplasty of this area.  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  His most recent angioplasty of this area was 03/23/11.  PHYSICAL EXAMINATION:  A well-developed, well-nourished white male in no acute distress.  Blood pressure is 156/64, pulse 76, respirations 18. He does have aneurysms over the venous AV fistula in his upper arm.  No evidence of any skin breakdown over this area.  He does have an excellent thrill throughout this.  I discussed the options with patient.  I certainly would not recommend any treatment specifically related to the aneurysms.  I explained that this was related to the venous outflow stenosis causing these aneurysms to form and progress.  I explained the option would be continued dilatation as needed.  He also could potentially be amenable to a revision of this surgically by dividing his cephalic vein at the level of the stent and mobilizing this and anastomosing it to the deep pain in his axilla.  I would reserve this for progressive failure in his graft or failure of angioplasty.  Currently this  appears to be working well. He was reassured with this discussion and will see [place] on an as-needed basis.    [name], M.D. Electronically Signed  TFE/MEDQ  D:  04/11/2011  T:  04/12/2011  Job:  [id number]  cc:   Mindi Slicker. Lowell Guitar, M.D. [facility], [place]

## 2011-04-14 NOTE — H&P (Signed)
[place]  Patient:    Andrew [name]                        MRN: [id number] Adm. Date:  [id number] Attending:  Lauree Chandler CC:         Lucrezia Starch. August Luz, M.D.                         History and Physical  HISTORY:  This 77 year old white male was seen by me this fall for evaluation of abnormal upper urinary tracts.  He has been having a creatinine level in the 4s at that time.  An MRI at [facility] showed kidneys with multiple cysts and suspected hydronephrosis of the right kidney and renovascular disease in the left kidney.  He underwent work-up with cystoscopy and retrograde pyelograms.  The left upper tracts were nonobstructed, but he had large filling defects in the upper ureter on the right and biopsy showed transitional cell carcinoma.  He has subsequently undergone angioplasty of the left renovascular stenosis, along with stenting.  His serum creatinine now is down in the 3s.  Discussion of the patient with Dr. August Luz concluded that the best option for this gentleman is a nephroureterectomy as opposed to a more aggressive relocation of the kidney, and the risk of leaving tumor in the renal pelvis.  The patient is obviously aware hat he is at risk for going on dialysis even without surgery, much less with the proposed procedure.  He had a chest x-ray that raised the question of a nodule, but a CT scan yesterday done in followup showed no evidence of tumors in the chest. He has been counselled about his postoperative stay and need for two incisions and a Foley catheter postoperatively.  PAST HISTORY:  He has hypertension, hypercholesterolemia, and coronary artery disease.  He has had a coronary artery bypass graft in 1998.  MEDICATIONS:  1. Hydrochlorothiazide 12.5 mg a day.  2. Toprol XL 50 mg a day.  3. Norvasc 2.5 mg a day.  4. Vitamin E.  5. Multivitamins.  6. Ecotrin.  7. Plavix 75 mg a day.  8. Prevacid  p.r.n.  ALLERGIES:  None.  HABITS:  Tobacco: None.  Alcohol: None.  SOCIAL HISTORY:  He is a retired Paramedic.  FAMILY HISTORY:  Noncontributory.  REVIEW OF SYSTEMS:  There is no known health history checkoff form.  PHYSICAL EXAMINATION:  VITAL SIGNS:  Blood pressure 118/_____, pulse 16, respiratory rate _____. Temperature is 97.3.  GENERAL:  He is alert and oriented.  SKIN:  Warm and dry.  NECK:  Supple.  CHEST:  Clear.  HEART:  Heart tones regular.  ABDOMEN:  Soft and nontender with no masses.  GENITALIA:  External genitalia unremarkable.  RECTAL:  Prostate feels benign and smooth.  EXTREMITIES:  No edema.  IMPRESSION:  1. Right ureteral carcinoma.  2. Left renovascular disease.  3. Renal insufficiency.  4. Hypertension.  5. Coronary artery disease.  6. Hypercholesterolemia.  7. Dyspepsia.  8. Deafness - wearing hearing aids.  PLAN:  Right nephroureterectomy.DD:  10/12/99 TD:  10/12/99 Job: [id number]

## 2011-04-14 NOTE — Consult Note (Signed)
NAME:  ABDULRAHIM, Andrew [name]                          ACCOUNT NO.:  [id number]   MEDICAL RECORD NO.:  [id number]                   PATIENT TYPE:  INP   LOCATION:  IC10                                 FACILITY:  [facility]   PHYSICIAN:  Jorja Loa, M.D.             DATE OF BIRTH:  26-Nov-1926   DATE OF CONSULTATION:  DATE OF DISCHARGE:                                   CONSULTATION   REASON FOR CONSULT:  CHF.   HISTORY OF PRESENT ILLNESS:  [name] is a 77 year old Caucasian  male with past medical history of hypertension, coronary artery disease, and  end-stage renal disease on maintenance hemodialysis.  He was admitted about  a week ago with high problem of shortness of breath, orthopnea, and  paroxysmal nocturnal dyspnea which was found with fluid overload, and he was  dialyzed and then discharged.  Presently came with the same complaints of  shortness of breath, orthopnea, and paroxysmal nocturnal dyspnea.  Hence,  the patient was found to have still elevated blood pressure; he was admitted  to the intensive care unit, and I was called for dialysis.   PAST MEDICAL HISTORY:  As stated above, the patient with:  1. Coronary artery disease, status post cardiac in 1998.  2. History of hypertension.  3. History of hypercholesterolemia.  4. History of possible CVA.  5. Transitional carcinoma, status post nephrectomy.  6. History of renal artery stenosis of the remaining kidney.  7. History of end-stage, maintained on femoral dialysis.  8. History of uncontrolled hypertension.  9. Recurrent CHF.  10.      Difficulty hearing.   MEDICATIONS:  1. Zyloprim 100 mg p.o. daily.  2. Aspirin 81 mg p.o. daily.  3. Colchicine 0.6 mg p.o. daily.  4. Metoprolol 50 mg p.o. daily.  5. Renagel 3400 mg p.o. three times daily.  6. Zocor 20 mg p.o. daily.  7. Bicarbonate 100 mg p.o. daily.  8. Albuterol inhaler 2.5 mg p.o. daily.  9. Catapres 0.1 mg daily.  10.      Lasix 100 mg daily.  11.       Morphine 2 mg p.o. every five hours.  12.      He is also on nitroglycerin 1 inch to the chest wall.  13.      He is also on oxygen via nasal cannula.   ALLERGIES:  No known allergies.   SOCIAL HISTORY:  No history of smoking.  No history of alcohol abuse.  He is  with his wife.   REVIEW OF SYSTEMS:  Complains of severe shortness of breath; feels better  once he was put on oxygen.  He also complains of orthopnea, paroxysmal  nocturnal dyspnea.  He denies any chest pain at this moment, and he denies  nausea or vomiting.  No diarrhea.  He states he makes some urine.   PHYSICAL EXAMINATION:  VITAL SIGNS:  Blood pressure  186/98, heart rate is  97, he is afebrile.  HEENT:  [place]/AT, no conjunctival pallor, nonicteric.  The patient with severe  hearing problem.  Seems to be at this moment somewhat comfortable.  No  conjunctival pallor.  No icterus.  NECK:  Positive JVD.  No bruits.  CHEST:  He has some inspiratory crackles, and also he has expiratory  wheezing bilaterally.  HEART:  Regular rate and rhythm, no S3, no murmurs.  ABDOMEN:  Soft.  Positive bowel sounds.  EXTREMITIES:  No edema.   LABORATORY DATA:  Blood work:  White blood count is 17.4, hemoglobin 16.3,  hematocrit of 45.8, platelets of 217.  His sodium is 139, potassium 6.1, BUN  65, creatinine 10.2.  His CPK is 66, initially and MB fraction is 4.4.  Troponin is 0.03.   ASSESSMENT:  1. Congestive heart failure in a patient with fluid overload.  He claims he     is not drinking that much but this does become a recurrent problem, and     also at the time he developed problem over the long weekend.  This is his     third admission within the last two months.  The patient at this moment     on home oxygen and now is on oxygen.  His oxygen saturation is around 92,     but he has bilateral wheezing and also rales.  Not sure whether he has     ischemic also insult because of his multiple risk factors and previous     history  of coronary artery disease and CABG.  2. Hypertension, uncontrolled.  Blood pressure seems to be high.  It is     coming down after he was put on nitro and clonidine.  He is on metoprolol     at home.  He states that he had taken his medication yesterday but     repeatedly when he comes in his blood pressure goes very high, probably     also because of the fluid retention.  Most of the time it is very     sensitive to fluid removal.  3. Coronary artery disease, status post coronary artery bypass grafting.  He     does not have any chest pain.  However, at this moment cannot rule out     myocardial infarction,  even though cardiac enzymes within acceptable     range because of recurrent congestive heart failure.  4. Hypertension. Blood pressure seems to be high.  5. Anemia.  Hemoglobin and hematocrit is stable.  6. Hyperkalemia.  Potassium 6.11.  7. History of gout.  He is on colchicine.   RECOMMENDATIONS:  Will make plans for emergency dialysis.  Will try to  remove about 3 L.  Will use straight _______ and blood flow and ________ 100  and will follow patient, and once the dialysis is finished if his blood  pressure is found to be high, probably will start antihypertensive  medication.  Will discuss with the patient about his salt intake and also  will discuss with Dr. __________ to encourage him about the possibility of  change his ________.                                               Jorja Loa, M.D.    BB/MEDQ  D:  06/01/2003  T:  06/01/2003  Job:  [id number]

## 2011-04-14 NOTE — Procedures (Signed)
   NAME:  Andrew [name], Andrew [name]                          ACCOUNT NO.:  [id number]   MEDICAL RECORD NO.:  [id number]                   PATIENT TYPE:  INP   LOCATION:  A202                                 FACILITY:  [facility]   PHYSICIAN:  Maisie Fus C. Wall, M.D.                DATE OF BIRTH:  05-12-1926   DATE OF PROCEDURE:  DATE OF DISCHARGE:                                  ECHOCARDIOGRAM   INDICATIONS FOR PROCEDURE:  The echocardiogram was done for coronary artery  disease (Code 414.01, 429.2).   The echocardiogram was technically difficult.   CONCLUSION:  1. Mild to moderate left atrial enlargement.  2. Normal left ventricular chamber size and overall systolic function.     Ejection fraction around 50%.  There was mild septal dyssyneresis  3. No important valve abnormalities other than some aortic valve sclerosis.     The valve appears to open normally.  There is trivial aortic     insufficiency.  4. Normal right-sided structures and function.  5. No pericardial effusion.                                               Thomas C. Wall, M.D.    TCW/MEDQ  D:  03/30/2003  T:  03/31/2003  Job:  [id number]   cc:   Kirk Ruths, M.D.  [address]  [place]  [place] [address]  Fax: [phone]   Gracelyn Nurse, M.D.  [address]  [place] [address]  Fax: [phone]

## 2011-04-14 NOTE — Op Note (Signed)
[facility]  Patient:    Andrew [name], Andrew [name] Visit Number: [id number] MRN: [id number]          Service Type: NES Location: NESC Attending Physician:  Lauree Chandler Dictated by:   Maretta Bees. Vonita Moss, M.D. Proc. Date: 03/12/02 Admit Date:  03/12/2002   CC:         Dyke Maes, M.D.   Operative Report  PREOPERATIVE DIAGNOSIS:  Recurrent bladder carcinoma.  POSTOPERATIVE DIAGNOSIS:  Recurrent bladder carcinoma.  PROCEDURE:  Cystoscopy, left retrograde pyelogram with interpretation, and cold cup bladder biopsy and fulguration of bladder tumors.  SURGEON:  Maretta Bees. Vonita Moss, M.D.  ANESTHESIA:  General.  INDICATIONS:  This 77 year old white male on dialysis, has had a past history of a right nephroureterectomy for TCC.  He has also had some small bladder tumors and in a recent surveillance cystoscopy, he was noted to have recurrent tumors.  DESCRIPTION OF PROCEDURE:  The patient was brought to the operating room and placed in the lithotomy position.  External genitalia were prepped and draped in the usual fashion.  He was cystoscoped, and he had a 5 mm papillary tumor on the right trigone, the left trigone, and the middle of the trigone near the bladder neck.  The right and left trigonal tumors were resected with the cold cup bladder biopsy forceps, and then the bases were fulgurated with the Bugbee electrode.  I could not really easily get at the mid trigonal tumor with the cold cup forceps, so I just ended up fulgurating that tumor and destroying it completely in that way.  I then performed a left retrograde pyelogram with an olive-tip ureter catheter, and there were no filling defects or obstruction noted to the left pyelocaliceal system and ureter.  The bladder was emptied and the scope removed, and the patient sent to recovery room in good condition. Dictated by:   Maretta Bees. Vonita Moss, M.D. Attending Physician:  Lauree Chandler DD:  03/12/02 TD:  03/12/02 Job: [id number]

## 2011-04-14 NOTE — H&P (Signed)
NAME:  Andrew [name], Andrew [name] NO.:  [id number]   MEDICAL RECORD NO.:  [id number]          PATIENT TYPE:  INP   LOCATION:  A209                          FACILITY:  [facility]   PHYSICIAN:  Patrica Duel, M.D.    DATE OF BIRTH:  29-Apr-1926   DATE OF ADMISSION:  02/03/2007  DATE OF DISCHARGE:  LH                              HISTORY & PHYSICAL   CHIEF COMPLAINT:  Weakness and fever.   HISTORY OF PRESENT ILLNESS:  This is a 77 year old male with a complex  medical history.  He has undergone coronary aortobypass grafting in 1998  for diffuse coronary artery disease.  He has end-stage renal disease on  chronic hemodialysis and managed by [facility] in  [place].  There is a longstanding history of hypertension,  hyperlipidemia and remote CVA.  He has history of renal artery stenosis  and has undergone stenting of his left renal artery.  There is a history  of congestive heart failure related to fluid overload.  His last  ejection fraction was measured and 50%.   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  He has undergone multiple  procedures.  Most recently, the patient had a cystoscopy with left  retrograde pyelogram with interpretation and cold cup bladder biopsy and  fulguration of bladder tumors performed by Dr. Larey Dresser [facility].  This was apparently  uncomplicated.  He had mild hematuria upon discharge but this has clear  and he has recurrent.  He has no hematuria, dysuria or other symptoms.   The patient has also had problems with his arteriovenous fistula for  dialysis.  He currently has an Ash catheter in his right subclavian vein  that has been used for 3 months.  He recently underwent a fistulogram of  the right upper extremity.  Results are unknown.   [facility], the patient did  well  until approximately 24 hours ago when he developed increasingly  severe diffuse weakness and myalgia.  He has also had severe chills but  no history of rigors.  His only complaint is that of a cough which is  minimally productive.  He has had no shortness of breath, chest pain,  hemoptysis, neurologic deficits, chest pain, syncope or palpitations,  nausea, vomiting or diarrhea.   The patient was brought to the emergency department by ambulance.  He  was found to be febrile but hemodynamically stable.  He was also  somewhat lethargic.   Significant findings on laboratory analysis revealed a white count of  15,200 with left shift.  H&H 11.9 and 36.2, platelet count 135,000.  Electrolytes:  Sodium 131, potassium 5.1.  BUN and creatinine expectedly  elevated.  Arterial blood gas revealed pH 7.31, pCO2 of 55 and this was  a venous study.  The urinalysis revealed moderate blood, 100 protein,  small leukocytes, 21-50 white cells and 11-20 red cells and many  bacteria.   The patient was given vancomycin and Levaquin has  been ordered in the  emergency department.  His mental status is cleared dramatically per the  patient's daughter.   The patient is admitted with appears to be urosepsis.  He is at high  risk for catheter sepsis as well.   CURRENT MEDICATIONS:  1. Include Toprol XL 50 mg p.o. nightly.  2. Prevacid 15 mg nightly.  3. Allopurinol 100 mg daily.  4. Renagel 4 tablets p.o. t.i.d.  5. Amlodipine 10 mg p.o. nightly.  6. Colchicine 0.6 mg p.o. daily.  7. Lipitor 10 mg p.o. nightly.  8. Isosorbide mononitrate 30 mg p.o. nightly.  9. Aspirin 81 mg daily.  10.MiraLax and Sensipar.   ALLERGIES:  None known.   PAST HISTORY:  As noted.   REVIEW OF SYSTEMS:  Negative except as mentioned.   FAMILY HISTORY:  Noncontributory.   SOCIAL HISTORY:  Nonsmoker, nondrinker.  He lives alone.   PHYSICAL EXAMINATION:  This is a very pleasant, fully alert male who  recognizes me  immediately.  He is in no acute distress.  PRESENTING VITAL SIGNS:  Temperature 99 degrees, BP 134/61, heart rate  is approximately 90.  Sinus rhythm apparent on monitor.  HEENT: Normocephalic, atraumatic.  Pupils are equal.  Ears, nose, throat  are benign.  Mucous membranes are moist.  Neck is supple.  No bruits,  thyromegaly, lymphadenopathy or masses noted.  LUNGS:  Clear to A&P.  HEART:  Sounds are normal without murmurs, rubs or gallops.  ABDOMEN:  Nontender, nondistended.  Bowel sounds are intact.  EXTREMITIES:  No clubbing, cyanosis or edema.  Peripheral pulses are  intact.  The fistula in the right upper extremity has good pulsations.  There are no thrills apparent.  NEUROLOGIC:  Exam is nonfocal.   ASSESSMENT:  Febrile illness in an elderly male with a complex history  as reviewed above.  He apparently has urosepsis but he has had some  bronchitic symptoms.  He is at high risk for catheter sepsis as well.   PLAN:  I will continue home medications, prudent IV hydration and  continue with vancomycin and Levaquin combo pending blood culture  results.  We will follow and treat expectantly.      Patrica Duel, M.D.  Electronically Signed    MC/MEDQ  D:  02/03/2007  T:  02/04/2007  Job:  [id number]

## 2011-04-14 NOTE — Op Note (Signed)
NAME:  Andrew [name], MAZER NO.:  [id number]   MEDICAL RECORD NO.:  [id number]          PATIENT TYPE:  AMB   LOCATION:  NESC                         FACILITY:  [facility]   PHYSICIAN:  Maretta Bees. Vonita Moss, M.D.DATE OF BIRTH:  Feb 07, 1926   DATE OF PROCEDURE:  07/17/2006  DATE OF DISCHARGE:                                 OPERATIVE REPORT   PREOPERATIVE DIAGNOSIS:  Bladder carcinoma.   POSTOPERATIVE DIAGNOSIS:  Bladder carcinoma.   PROCEDURE:  Cystoscopy and resection of bladder tumor.   SURGEON:  Dr. Larey Dresser.   ANESTHESIA:  General.   INDICATIONS:  This 76 year old gentleman has history of recurrent bladder  carcinomas, also has had a right nephroureterectomy for urothelial  carcinoma.  On surveillance cystoscopy he was found that was solitary  bladder tumor.  He is brought to the OR today for resection.   PROCEDURE:  The patient brought to the operating room, placed lithotomy  position.  External genitalia were prepped and draped in usual fashion.  He  was cystoscoped.  Anterior and prostatic urethras were unremarkable.  The  only lesion in the bladder was a papillary tumor measuring about 1.5 cm in  size on the left between the dome and left wall.  The cold cup bladder  biopsy forceps were used to totally resect this papillary tumor and then the  biopsy site and surrounding mucosa was fulgurated with the Bugbee electrode.  The bladder was emptied, the scope removed.  There was essentially no blood  loss and he was taken to recovery room in good condition having tolerated  the procedure well.      Maretta Bees. Vonita Moss, M.D.  Electronically Signed     LJP/MEDQ  D:  07/17/2006  T:  07/17/2006  Job:  [id number]   cc:   [facility]

## 2011-04-14 NOTE — H&P (Signed)
NAME:  Andrew [name], Andrew [name]                          ACCOUNT NO.:  [id number]   MEDICAL RECORD NO.:  [id number]                   PATIENT TYPE:  INP   LOCATION:  IC10                                 FACILITY:  [facility]   PHYSICIAN:  Patrica Duel, M.D.                 DATE OF BIRTH:  1926-04-12   DATE OF ADMISSION:  06/01/2003  DATE OF DISCHARGE:                                HISTORY & PHYSICAL   CHIEF COMPLAINT:  Shortness of breath.   HISTORY OF PRESENT ILLNESS:  This is a 77 year old male who has a history of  coronary artery disease, hypertension, end-stage renal disease, on  maintenance dialysis.  The patient was admitted a week ago to the day with  acute congestive heart failure, which responded promptly to dialysis and was  discharged the following day.   The patient presented to the emergency department with increasingly severe  dyspnea and orthopnea.  He was supposed to have his dialysis at 8 a.m. today  but presented to the emergency department because he could not make it that  long.  There, he was found to have florid pulmonary edema and was  administered Lasix and oxygen.  The dialysis nurse is currently unavailable  until approximately 9:30 (two hours from now).  Emergent transfer to  [facility] where a stat dialysis is available was suggested, though  apparently the patient was too unstable to put on the road.  Currently, he  is in moderate distress, diaphoretic, and markedly hypertensive and in need  of emergent dialysis.   The patient reports some intermittent chest pain but denies headache,  neurologic deficits, nausea, vomiting, diarrhea, melena, hematemesis, or  hematochezia.   The patient was admitted for emergent dialysis and whatever intervention we  can offer pending fluid removal via dialysis, as he makes very little urine.   PAST MEDICAL HISTORY:  As noted above.  He also has hyperlipidemia.  He is  status post right vertebral CVA.  History of  transitional carcinoma of the  bladder, status post nephrectomy and history of renal artery stenosis of the  remaining kidney.  He is status post angioplasty as well.   ALLERGIES:  None known.   CURRENT MEDICATIONS:  Aspirin, nitro p.r.n., Renagel 800 mg 2 tabs t.i.d.,  Nephro-Vite 1 tab p.o. daily, and Lipitor 10 mg daily.   SOCIAL HISTORY:  He does not smoke or use alcohol.  He is a retired Engineer, drilling.   REVIEW OF SYSTEMS:  Negative except as mentioned.   FAMILY HISTORY:  Noncontributory.   PHYSICAL EXAMINATION:  GENERAL:  This is a white male who appears younger  than his stated age of 34 years.  He is currently diaphoretic with a  respiratory rate of approximately 30 per minute.  VITAL SIGNS:  Blood pressure approximately 220/100, heart rate is 80 and  regular.  He is afebrile.  HEENT:  Normocephalic and  atraumatic.  Pupils are equal.  Nose and throat  are benign.  NECK:  Supple.  There is 2 cm JVD present when he is elevated 30 degrees.  LUNGS:  Reveal diffuse, coarse rhonchi and rales at both bases.  HEART:  Heart sounds are distant.  No murmurs or rubs.  A gallop is audible.  ABDOMEN:  Nontender and nondistended.  Bowel sounds are intact.  EXTREMITIES:  No significant edema noted.   LABORATORY DATA:  White count 13,000.  H & H normal.  Chem 7:  sodium 139,  potassium 6.1, chloride 107, CO2 22, glucose 143, BUN 55, creatinine 10.2.  Cardiac enzymes:  CK total 66, CK-MB 4.4, troponin 0.03.  Chest x-ray:  pulmonary edema.  EKG:  nonspecific interventricular conduction delay.  Normal sinus rhythm. Heart rate of 77.  No acute ST-T changes are noted.   ASSESSMENT:  Florid pulmonary edema in a male with the above history with  essentially no renal function.  Possible active ischemic syndrome, though no  evidence of that at this time.  The patient is also extremely hypertensive,  which may be exacerbating his current problem.   PLAN:  Administer high-dose Lasix, morphine,  clonidine (low dose), as well  as nitro paste to temporize pending dialysis, which is approximately one  hour and a half from now.                                               Patrica Duel, M.D.    MC/MEDQ  D:  06/01/2003  T:  06/01/2003  Job:  [id number]

## 2011-04-14 NOTE — Op Note (Signed)
NAME:  Andrew [name], Andrew [name] NO.:  [id number]   MEDICAL RECORD NO.:  [id number]          PATIENT TYPE:  AMB   LOCATION:  NESC                         FACILITY:  [facility]   PHYSICIAN:  Maretta Bees. Vonita Moss, M.D.DATE OF BIRTH:  11-17-1926   DATE OF PROCEDURE:  11/30/2005  DATE OF DISCHARGE:                                 OPERATIVE REPORT   PREOPERATIVE DIAGNOSES:  Recurrent bladder carcinoma and history of right  nephroureterectomy.   POSTOPERATIVE DIAGNOSES:  Recurrent bladder carcinoma and history of right  nephroureterectomy.   PROCEDURE:  Cystoscopy, resection of bladder tumor and left retrograde  pyelogram with interpretation.   SURGEON:  Dr. Larey Dresser.   ANESTHESIA:  General.   INDICATIONS:  This gentleman has a small bladder tumor found on recent  surveillance cystoscopy in view of his history of bladder carcinomas and a  history of TCC that required right nephroureterectomy, he is a dialysis  patient. He is brought to the OR for resection of this bladder tumor and  retrograde pyelogram on the left side.   DESCRIPTION OF PROCEDURE:  The patient was brought to the operating room,  placed in lithotomy position, external genitalia were prepped and draped in  the usual fashion. He cystoscoped and the only lesion in the bladder was a  small papillary tumor on the dome that measured 5 mm in size. The cold cup  biopsy forceps was used to completely resect this lesion and the biopsy site  was then fulgurated with the Bugbee electrode.   Left retrograde pyelogram was performed using a cone-tip catheter and the  pyelocaliceal system was delicate with no obstruction and no filling defects  and the left ureter was normal.   At this point, the bladder was emptied, scope removed and the patient sent  to the recovery room in good condition having tolerated the procedure well.      Maretta Bees. Vonita Moss, M.D.  Electronically Signed     LJP/MEDQ  D:  11/30/2005  T:   11/30/2005  Job:  [id number]   cc:   [facility]

## 2011-04-14 NOTE — Discharge Summary (Signed)
   NAME:  Andrew [name], Andrew [name] NO.:  [id number]   MEDICAL RECORD NO.:  [id number]                   PATIENT TYPE:  INP   LOCATION:  4742                                 FACILITY:  [facility]   PHYSICIAN:  Hunter Bing, M.D.               DATE OF BIRTH:  11-10-1926   DATE OF ADMISSION:  07/27/2003  DATE OF DISCHARGE:  07/28/2003                           DISCHARGE SUMMARY - REFERRING   ADDENDUM TO DISCHARGE SUMMARY:  These are laboratory studies that go to  [facility] in [place], [place], also to Dr. Anne Hahn, [place],  [place], fax [phone], also to Dr. Patrica Duel, [place], [place], fax [phone], also to Dr. Jorja Loa, [place], [place], fax [phone].   They are as follows:  Comprehensive metabolic panel, July 27, 2003, sodium 140, potassium 4.7,  chloride 108, carbon dioxide 27, glucose 113, BUN 18, creatinine 5.8.  Alkaline phosphatase 55, SGOT 27, SGPT 14.  Pro thrombin time, July 27, 2003, 13.2, INR 1.0, PTT 31.  Complete blood count, July 27, 2003, white  cells 6.5, hemoglobin 15, hematocrit 46, platelets 132.  CK-MB, July 28, 2003, CK is 45, CK-MB is 2.7, troponin 0.02.      Maple Mirza, P.A.                    Questa Bing, M.D.    GM/MEDQ  D:  07/28/2003  T:  07/28/2003  Job:  [id number]   cc:   Dorise Hiss, M.D.  [address]  [place]  [place] [address]  Fax: [phone]   Patrica Duel, M.D.  [address], [address]  [place]  [place] [address]  Fax: [phone]   Jorja Loa, M.D.  [address]  [place]  [place] [address]  Fax: [phone]

## 2011-04-14 NOTE — Op Note (Signed)
NAME:  Andrew [name], Andrew [name]                ACCOUNT NO.:  [id number]   MEDICAL RECORD NO.:  [id number]          PATIENT TYPE:  AMB   LOCATION:  SDS                          FACILITY:  [facility]   PHYSICIAN:  Di Kindle. Edilia Bo, M.D.DATE OF BIRTH:  [birth year]   DATE OF PROCEDURE:  09/18/2006  DATE OF DISCHARGE:  09/18/2006                                 OPERATIVE REPORT   PREOPERATIVE DIAGNOSIS:  Chronic renal failure.   POSTOPERATIVE DIAGNOSIS:  Chronic renal failure.   PROCEDURE:  Ultrasound guided placement of a right internal jugular Diatek  catheter.   SURGEON:  Di Kindle. Edilia Bo, M.D.   ANESTHESIA:  Local with sedation.   TECHNIQUE:  The patient was taken to the operating and sedated by  anesthesia.  The ultrasound scanner was used to mark both internal jugular  veins, both of which appeared to be patent.  The neck and upper chest were  then prepped and draped in the usual sterile fashion.  After the skin was  infiltrated with 1% lidocaine, the right IJ was cannulated and a guidewire  introduced into the superior vena cava under fluoroscopic control.  The  tract over the wire was dilated and then a dilator and peel-away sheath were  passed over the wire and the wire and dilator were removed.  The catheter  was passed through the peel-away sheath and positioned in the right atrium.  The exit site of the catheter was selected and the skin anesthetized between  the two areas.  The catheter was then brought through the tunnel, cut to the  appropriate length, and the distal ports were attached.  Both ports withdrew  easily and were then flushed with heparinized saline and filled with  concentrated heparin.  The catheter was secured at its exit site with 3-0  nylon suture.  The IJ cannulation site was closed with 4-0 subcuticular  stitch.  Sterile dressings were applied.  The patient tolerated the  procedure well and was transferred to the recovery room in satisfactory  condition.   All needle and sponge counts were correct.      Di Kindle. Edilia Bo, M.D.  Electronically Signed     CSD/MEDQ  D:  09/18/2006  T:  09/19/2006  Job:  [id number]

## 2011-04-14 NOTE — Op Note (Signed)
NAME:  Andrew [name], Andrew [name] NO.:  [id number]   MEDICAL RECORD NO.:  [id number]          PATIENT TYPE:  AMB   LOCATION:  NESC                         FACILITY:  [facility]   PHYSICIAN:  Maretta Bees. Vonita Moss, M.D.DATE OF BIRTH:  1926-05-03   DATE OF PROCEDURE:  01/31/2007  DATE OF DISCHARGE:                               OPERATIVE REPORT   PREOPERATIVE DIAGNOSIS:  Recurrent bladder carcinoma and history of  right nephroureterectomy for urothelial carcinoma.   POSTOPERATIVE DIAGNOSIS:  Recurrent bladder carcinoma and history of  right nephroureterectomy for urothelial carcinoma.   PROCEDURE:  Cystoscopy, left retrograde pyelogram with interpretation  and cold cup bladder biopsy and fulguration of bladder tumors.   SURGEON:  Larey Dresser, M.D.   ANESTHESIA:  General.   INDICATIONS:  This 77 year old gentleman is on dialysis and has had  recurrent bladder carcinomas.  Recent surveillance cystoscopy revealed a  papillary lesion on the bladder floor.  He has a history of a right  nephroureterectomy for urothelial carcinoma.   PROCEDURE:  The patient was brought to the operating room and placed in  the lithotomy position.  External genitalia were prepped and draped in  the usual fashion.  He was cystoscoped.  The anterior urethra was  normal.  The prostatic urethra was unremarkable.  The bladder had  trabeculation and on the left floor there were two 1 cm papillary  lesions with some erythema.   Left retrograde pyelogram was obtained by injecting contrast through a  cone-tipped ureteral catheter placed by cystoscope.  The left ureter and  pyelocaliceal system was delicate and nonobstructed with no filling  defects.   Cold cup bladder biopsy forceps were used to biopsy the two separate 1  cm lesions on the left bladder floor and then the remaining suspicious  tissue was fulgurated with the Bugbee electrode.  At this point the  bladder had no evidence of bleeding or residual  tumors.  The bladder was  emptied, the cystoscope removed and the patient sent to the recovery  room in good condition having tolerated the procedure well.      Maretta Bees. Vonita Moss, M.D.  Electronically Signed     LJP/MEDQ  D:  01/31/2007  T:  01/31/2007  Job:  [id number]

## 2011-04-14 NOTE — Op Note (Signed)
NAME:  ADEMIDE, SCHABERG                ACCOUNT NO.:  [id number]   MEDICAL RECORD NO.:  [id number]          PATIENT TYPE:  AMB   LOCATION:  SDS                          FACILITY:  [facility]   PHYSICIAN:  Quita Skye. Hart Rochester, M.D.  DATE OF BIRTH:  1926-08-18   DATE OF PROCEDURE:  03/13/2007  DATE OF DISCHARGE:                               OPERATIVE REPORT   PREOPERATIVE DIAGNOSIS:  Poorly functioning AV fistula right upper arm  possibly secondary to competing branch.   POSTOPERATIVE DIAGNOSIS:  Poorly functioning AV fistula right upper arm  possibly secondary to competing branch.   OPERATIONS:  Ligation of competing branch of right upper arm AV fistula.   SURGEON:  Quita Skye. Hart Rochester, M.D.   ANESTHESIA:  Local Xylocaine.   COMPLICATIONS:  None.   OPERATION:  The patient was taken to the operating room, placed in the  supine position at which time the right upper extremity was prepped with  Betadine scrub and solution and draped in routine sterile manner.  After  infiltration of 1% Xylocaine with epinephrine,  after identifying the  precise location of the branch using intraoperative Duplex scanning, a  short transverse incision was made over the branch medial to the  cephalic vein which had an excellent pulse and a palpable thrill.  The  branch was easily identified, circled with 2-0 silk tie and ligated.  Following this, the fistula was 3-0 reexamined with a Duplex scanner and  was noted that the branch was occluded.  The wound was closed with  Vicryl in subcuticular fashion.  Sterile dressing applied.  The patient  taken to recovery room in satisfactory condition.      Quita Skye Hart Rochester, M.D.  Electronically Signed     JDL/MEDQ  D:  03/13/2007  T:  03/13/2007  Job:  [id number]

## 2011-04-14 NOTE — Discharge Summary (Signed)
NAME:  Andrew [name], Andrew [name]                          ACCOUNT NO.:  [id number]   MEDICAL RECORD NO.:  [id number]                   PATIENT TYPE:  INP   LOCATION:  IC10                                 FACILITY:  [facility]   PHYSICIAN:  Patrica Duel, M.D.                 DATE OF BIRTH:  Jan 21, 1926   DATE OF ADMISSION:  06/01/2003  DATE OF DISCHARGE:  06/02/2003                                 DISCHARGE SUMMARY   DISCHARGE DIAGNOSES:  1. Acute pulmonary edema secondary to congestive heart failure.  2. End-stage renal disease.  3. Hypertension.   HISTORY OF PRESENT ILLNESS:  For details regarding admission please refer to  admit note.  Briefly, Andrew [name] was admitted approximately a week prior to  this admission with acute pulmonary edema on a Sunday evening.  He had  received his last dialysis the preceding Friday.  He responded promptly to  hemodialysis, and no major changes were made in his regimen, and he had done  well until the following Sunday night when he developed acute pulmonary  edema and was brought to the emergency department.  There he was found to be  in marked distress.  He did not respond to Lasix as he makes very little  urine.  He was admitted for stat dialysis and further evaluation and  therapies as indicated.   COURSE IN THE HOSPITAL:  Dr. Kristian Covey was very diligent in his management  and had dialysis performed approximately a hour after admission.  In the  interim we had treated him with clonidine as well as higher doses of Lasix  as well as nitroglycerin.  His blood pressure was markedly elevated,  approximately 230 systolic.  The intervention undertaken resulted in a very  positive response in his blood pressure as well as some relief of his  symptoms.  This was prior to dialysis.   Currently the patient is doing very well.  We feel that the etiology of his  pulmonary edema may also be the result of extreme hypertensive episodes in  addition to fluid overload.   At  this time the patient appears to be stable for discharge.  Dr. Kristian Covey  will contact Dr. Anne Hahn and discuss possibly changing his dialysis to  Tuesday, Thursday, Saturday, and we will add clonidine 0.2 b.i.d. to his  regimen.    DISPOSITION:  He is to continue his home medications, which have been  repeatedly documented, as well as clonidine 0.2 b.i.d.  Will follow and  treat expectantly as an outpatient.                                               Patrica Duel, M.D.    MC/MEDQ  D:  06/02/2003  T:  06/02/2003  Job:  [id number]  cc:   Dorise Hiss, M.D.  [address]  [place]  [place] [address]  Fax: [phone]

## 2011-04-14 NOTE — Op Note (Signed)
NAME:  ROLLO, Andrew [name] NO.:  [id number]   MEDICAL RECORD NO.:  [id number]          PATIENT TYPE:  AMB   LOCATION:  NESC                         FACILITY:  [facility]   PHYSICIAN:  Maretta Bees. Vonita Moss, M.D.DATE OF BIRTH:  12-14-1925   DATE OF PROCEDURE:  11/03/2004  DATE OF DISCHARGE:                                 OPERATIVE REPORT   PREOPERATIVE DIAGNOSIS:  Recurrent bladder carcinoma.   POSTOPERATIVE DIAGNOSIS:  Recurrent bladder carcinoma.   PROCEDURE:  1.  Cystoscopy.  2.  Left retrograde pyelogram with interpretation.  3.  Cold cup bladder biopsy.  4.  Fulguration of multiple bladder tumors.   SURGEON:  Dr. Larey Dresser   ANESTHESIA:  General.   INDICATIONS:  This 77 year old white male dialysis patient is brought to the  ER today for treatment of recurrent bladder tumors noted on surveillance  cystoscopy.  He has had recurrent bladder tumors before and has had a  previous right nephroureterectomy for TCC.   DESCRIPTION OF PROCEDURE:  The patient was brought to the operating room and  placed in lithotomy position.  The external genitalia were prepped and  draped in the usual fashion.  He was cystoscoped.  The anterior and  prostatic urethra was unremarkable.  The bladder had no particular  inflammation but had multiple papillary tumors that looked superficial.  There were tumors on the floor just lateral to the left ureteral orifice,  the dome, and anterior wall.  I used the cold cup bladder biopsy forceps to  resect tumor on the floor, dome, and trigone, and those biopsy sites were  fulgurated, and other small papillary tumors were also fulgurated with the  use of the Albarran elevator to reach some tumors that be otherwise  difficult to reach without the deflection of the Bugbee electrode.  A total  of 10 tumors were fulgurated, and most of these were about 5 mm in size,  although some were 2-3 mm, and this is probably the equivalent of a medium-  sized  bladder tumor.  There was no blood loss, good hemostasis, and the  bladder was emptied, scope removed, and B&O suppository inserted, and the  patient was taken to the recovery room in good condition.  I think we will  talk to his daughter about instituting BCG therapy and talked about risks of  infection and also told her I would check with Dr. Caryn Section at [facility] to make sure there was no contraindication.      LJP/MEDQ  D:  11/03/2004  T:  11/03/2004  Job:  [id number]

## 2011-04-14 NOTE — Op Note (Signed)
. [facility]  Patient:    Andrew [name]                        MRN: [id number] Proc. Date: 10/12/99 Adm. Date:  [id number] Attending:  Lauree Chandler CC:         Lucrezia Starch. August Luz, M.D.                           Operative Report  PREOPERATIVE DIAGNOSIS:  Right ureteral carcinoma.  POSTOPERATIVE DIAGNOSIS:  Right ureteral carcinoma.  PROCEDURE:  Right nephroureterectomy with excision of bladder cuff.  SURGEON:  Maretta Bees. Vonita Moss, M.D.  ASSISTANT: Veverly Fells. Vernie Ammons, M.D.  ANESTHESIA:  General.  INDICATIONS:  This 77 year old gentleman has had several urologic and nephralgic problems.  He has renal insufficiency with a creatinine in the range of 3-4. He has left renal vascular stenosis that was recently balloon dilated and stented. 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  He had undergone previous cystoscopy and ureteroscopy and was found to have transitional cell carcinoma along a 3-4 cm segment of the upper ureter just beginning at the UPJ.  There was concern about the tumor even being in the renal pelvis, so he was counseled and opted for a right nephroureterectomy.  DESCRIPTION OF PROCEDURE:  The patient is placed in the supine position with the right side elevated 10 degrees.  A right subcostal incision was made, and the retroperitoneal space was entered.  The kidney was dissected out, and then the ureter was found.  It was thickened and widened in the area consistent with his  previous study showing transitional cell carcinoma.  Below that, the ureter was  dissected down to below the pelvic brim, where it was divided and ligated and fulgurated.  The vascular pedicle was then dissected out, and two arterial branches of the right renal artery were dissected out and divided and ligated with heavy  black silk and metal Hemoclips.  The renal  vein was then tied off and ligated with heavy black silk and metal Hemoclips.  The rest of the kidney was dissected out and removed intact.  There was good hemostasis.  The adrenal gland was left in place, as it was not felt necessary to take this out to treat his tumor.  A long, black, silk tie had been left on the proximal end of the distal ureter, which is still in situ.  The wound was irrigated, and the subcostal incision was closed with two layers of running #1 PDS.  The subcu was irrigated, and the skin was closed with skin staples.  Attention was then turned down to the lower abdomen, and a right  curvilinear incision was made in the lower quadrant of the abdomen, and the right pelvic retroperitoneal space was dissected out.  The distal right ureter was identified and dissected out proximally and mobilized down to the bladder wall.  With a Foley catheter in the bladder measuring #22 [place] 5 cc, the bladder was  then inflated with sterile saline, and an anterior cystotomy was made.  The double-J catheter, which had been in the ureter, was then removed, and a ureteral catheter placed up the right ureteral orifice.  Black silk was sutured around the orifice, and electrocautery was used to excise a bladder cuff, and then mobilize the ureter from the intramural tunnel.  The distal ureter was removed intact, and black silk attached to the bladder cuff was sent to pathology for permanent section.  The cystotomy around the right ureteral orifice was then closed with  running #0 chromic catgut on a UR5 needle.  The bladder incision was then closed with two layers of running #1 chromic catgut, and the bladder was filled and irrigated.  There was no bleeding.  The incision was watertight.  Through a stab wound in the lower incision, a Blake drain was placed for perivesical space drainage postoperatively.  The drain was sutured in place and hooked to a bulb suction.  The fascia  was then closed with a running #1 PDS.  The wound was irrigated, and the skin closed with skin staples, and both wounds were cleaned nd dressed with dry, sterile gauze.  Sponge, needle, and instrument counts were correct.  Estimated blood loss was 250 cc.  He tolerated the procedure well. DD:  10/12/99 TD:  10/13/99 Job: [id number]

## 2011-04-14 NOTE — Consult Note (Signed)
NAME:  Andrew [name], Andrew [name]                          ACCOUNT NO.:  [id number]   MEDICAL RECORD NO.:  [id number]                   PATIENT TYPE:  INP   LOCATION:  IC08                                 FACILITY:  [facility]   PHYSICIAN:  Jorja Loa, M.D.             DATE OF BIRTH:  [birth year]   DATE OF CONSULTATION:  03/29/2003  DATE OF DISCHARGE:                                   CONSULTATION   REASON FOR CONSULTATION:  Anesthesia for dialysis and also because of  history of congestive heart failure.   HISTORY OF PRESENT ILLNESS:  The patient is a 77 year old white male with a  past medical history of hypertension, coronary artery disease, status post  coronary artery bypass grafting and stent, on maintenance for hemodialysis  presently, who came to the emergency room with complaints of mild chest pain  and also shortness of breath.  The patient says that initially he started  having orthopnea and paroxysmal nocturnal dyspnea.  He started having  shortness of breath when he was sitting, hence, he came to the emergency  room.  In the emergency room the patient was found to be in congestive heart  failure, hypertensive, and hypokalemic, hence, consult was called.  The  patient recalls that he had three or four episodes simply where he is having  the same problem.  His last dialysis was on Friday.   PAST MEDICAL HISTORY:  1. Coronary artery disease, status post coronary artery bypass grafting in     1998.  2. History of hypertension.  3. History of hypercholesterolemia.  4. History of deafness.  5. History of right ureteral CA, transitional cell carcinoma, status post     nephrectomy.  6. History of renal artery stenosis on the right.  7. Status post angioplasty.  8. History of end-stage renal disease, maintained on hemodialysis.   MEDICATIONS:  1. Aspirin 325 mg p.o. daily.  2. Renagel 800 mg p.o. a.c.  3. Nephro-Vite one tablet p.o. daily.  4. Lopid 100 mg p.o. daily.  5. Lipitor  10 mg p.o. daily.  6. Colchicine 0.6 mg p.o. t.i.d. p.r.n.  7. Nitro drip at 5 mcg/cc, but has been adjusted to a higher level because     of high blood pressure.   ALLERGIES:  No known drug allergies.   SOCIAL HISTORY:  There is no history of smoking, alcohol abuse now.  The  patient is an ex Paramedic.   REVIEW OF SYMPTOMS:  No fevers, chills, or sweating.  Mainly complains of  shortness of breath. The chest pain has improved since put on nitroglycerin.  He is on a face mask non-rebreather, no complaints of shortness of breath.  No nausea, vomiting, no diarrhea.  Denies any swelling of his legs.   PHYSICAL EXAMINATION:  VITAL SIGNS:  His blood pressure is 190/100.  HEENT:  No conjunctival pallor.  Oral mucosa moist.  NECK:  Neck.  He has some JVD.  CHEST:  Decreased breath sounds.  He has bilateral inspiratory rales.  No  murmur.  ABDOMEN:  Soft, active bowel sounds.  EXTREMITIES:  No edema.   LABORATORY DATA:  White blood cell count 7.8, hemoglobin 12.4, hematocrit  37.7.  Sodium 136, potassium 3.9, BUN 46, creatinine 9.3, calcium 10.3.  Total CPK 90, MB 3.9, troponin-I 0.03, and BNP 1160.  Chest x-ray which  shows cardiomegaly.   ASSESSMENT:  1. Congestive heart failure.  The patient at this moment is subsequently in     fluid overload, possibly from increased fluid intake.  The patient was     supposed to be dialyzed tomorrow.  At this moment since the patient has a     previous history of coronary artery disease status post coronary artery     bypass grafting, with renal artery stenosis, need to rule out myocardial     infarction, but his cardiac enzymes seem to be stable and his chest pain     also seems to be relieved.  2. Hypertension.  His blood pressure is very high.  He is on nitroglycerin.     Seems to be much better, but again his blood pressure systolic was as     high as 227 with diastolic of 121.  3. History of renal disease, status post nephrectomy.  4.  Coronary artery disease, status post coronary artery bypass grafting.  5. Hyperkalemia.  The patient has received insulin and __________  when he     was in the emergency room.   RECOMMENDATIONS:  I will try to dialyze today and probably attempt to get 4  L.  We will use 2.5 calcium, __________  to correct his hyperkalemia.  We  will put him on low potassium renal diet, and we will check his blood work  in the morning.  If he improves, then we will make a decision whether he  needs to get dialysis tomorrow or whether we can wait until Tuesday.  Possibly I will try to get his medication list from the dialysis unit, as  his blood pressure seems to be high.  I am not sure whether the patient is  on any other medication at this moment at home.  The patient does not seem  to be getting any.  In the morning, Dr. __________  will be coming and I  will transfer his care to him.  We will try to get more information from the  dialysis unit.                                               Jorja Loa, M.D.    BB/MEDQ  D:  03/29/2003  T:  03/29/2003  Job:  [id number]

## 2011-04-14 NOTE — Consult Note (Signed)
NAME:  Andrew [name], Andrew [name]                          ACCOUNT NO.:  [id number]   MEDICAL RECORD NO.:  [id number]                   PATIENT TYPE:  INP   LOCATION:  A214                                 FACILITY:  [facility]   PHYSICIAN:  Jorja Loa, M.D.             DATE OF BIRTH:  1925/12/23   DATE OF CONSULTATION:  DATE OF DISCHARGE:                                   CONSULTATION   This is covering for Dr. Anne Hahn who is his primary nephrologist.   This is a 77 year old Caucasian male who has a past medical history of  coronary artery disease, hypertension, end-stage, on maintenance dialysis.  Presently came to the emergency room with complaints of shortness of breath,  orthopnea and paroxysmal nocturnal dyspnea. The patient also complains of  left-sided chest pain which improved after he was in the emergency room and  he had nitro.  The patient has been admitted with a similar problem in May  of this year.  He denies any nausea or vomiting.  He is reasonable.   PAST MEDICAL HISTORY:  He has history of hypertension, coronary artery  disease, status post bypass in 1998.  History of hypercholesterolemia,  history of right vertebral CVA and history of weakness, history of  transitional carcinoma, status post nephrectomy, and history of renal artery  stenosis on the remaining right kidney.  History of end-stage on maintenance  hemodialysis and he is status post angioplasty.   MEDICATIONS PRESENTLY:  Aspirin __________ mg p.o. daily.  He is getting  morphine p.r.n. and he also was on nitro because of chest pain.  The patient  also is on Renagel 800 mg 2 tablets p.o. t.i.d., Nephro-Vite 1 tablet p.o.  daily.  He is on Lipitor 10 mg p.o. daily.   ALLERGIES:  No known allergies.   SOCIAL HISTORY:  He has no history of smoking or history of alcohol abuse.  The patient used to be Paramedic.  He lives with his wife.   REVIEW OF SYSTEMS:  Mainly shortness of breath and also left-sided chest  pain with no radiation.  He also has orthopnea and paroxysmal nocturnal  dyspnea.  He denies any nausea or vomiting.  No diarrhea.  History of a TUR  also, makes some urine.   PHYSICAL EXAMINATION:  GENERAL: On examination he is alert.  He is on nasal  O2.  VITAL SIGNS: Blood pressure 118/79, heart rate of 102, and respiratory rate  is 18.  HEENT: No conjunctival pallor. No icterus.  Oral mucosa seems to be somewhat  moist.  NECK: Supple, no bruits.  He has some JVD.  CHEST: Decreased breath sounds bilaterally.  He has some very soft crackles.  No wheezing.  HEART: His heart exam revealed regular rhythm. No murmurs.  No S3.  ABDOMEN: Obese, positive bowel sounds.  EXTREMITIES: No edema.   His blood work pH 7.24, pCO2 of 45.6.  His white  blood cell count is 11.7,  hemoglobin 14.7, hematocrit 44.7. Platelets 180.  Sodium 136, usual  potassium is 5.9.  PT is 13.8, INR of 1.8.  BUN is 52; creatinine 9.8;  glucose 119; and CO2 of 22.  His total CPK 100, MB fraction 5.3, and  troponin 0.04.  Chest x-ray according to the emergency room physician he has  sign of fluid overload with cardiomegaly.   ASSESSMENT:  1. End-stage.  The patient on hemodialysis.  The last dialysis was 2 days     ago.  The patient is due for dialysis.  His BUN and creatinine seems to     be within acceptable range.  A slight elevated potassium but has stayed     within excellent range.  2. Hypoxemia with congestive heart failure, orthopnea probably from     noncompliance with his fluid intake and also salt intake. The patient     states that he is making some urine he is drinking liquid.  Each day he     says he gains about 4 pounds, but in actuality the patient has gained     more than that.  3. Hypertension.  His blood pressure seems to be somewhat high.  The patient     was supposed to be on Toprol- XL 50 mg at home, not sure whether he has     taken that.  His blood pressure is somewhat elevated.  4. History  of coronary artery disease being followed by Dr. Sheppard Penton of     cardiology.  He has had some chest pain; but, at this moment, his cardiac     enzymes seem to be reasonable, still we need to rule out myocardial     infarction.  5. History of transitional cell carcinoma status post nephrectomy.  6. History of cerebrovascular accident.   RECOMMENDATIONS:  We will start the patient on dialysis.  Will use 3K bath,  2.5 calcium, and will try to remove about 4 liters today and will observe  him.  Once Dr. Anne Hahn comes the patient will be started to be followed by  him.                                               Jorja Loa, M.D.    BB/MEDQ  D:  05/25/2003  T:  05/25/2003  Job:  [id number]

## 2011-04-14 NOTE — Op Note (Signed)
NAME:  Andrew [name], Andrew [name]                          ACCOUNT NO.:  [id number]   MEDICAL RECORD NO.:  [id number]                   PATIENT TYPE:  AMB   LOCATION:  NESC                                 FACILITY:  [facility]   PHYSICIAN:  Maretta Bees. Vonita Moss, M.D.             DATE OF BIRTH:  1926/04/24   DATE OF PROCEDURE:  03/31/2004  DATE OF DISCHARGE:                                 OPERATIVE REPORT   INDICATIONS FOR PROCEDURE:  This gentleman is brought to the OR today for  therapy of recurrent bladder carcinoma.  He has had a right  nephroureterectomy for upper tract TCC and he has had bladder biopsies for  cancer in October 2000, November 2000, June 2002 and April 2003.  Recent  cystoscopy revealed a tumor what appeared to be anterior to the bladder neck  and I was unsure about access to it with a rigid scope.   PREOPERATIVE DIAGNOSES:  Recurrent bladder carcinoma.   POSTOPERATIVE DIAGNOSES:  Recurrent bladder cancer.   PROCEDURE:  Cystoscopy, left retrograde pyelogram with interpretation, cold  cup biopsy and fulguration of bladder tumor.   SURGEON:  Maretta Bees. Vonita Moss, M.D.   ANESTHESIA:  General.   DESCRIPTION OF PROCEDURE:  The patient was brought to the operating room,  placed in lithotomy position, external genitalia were prepped and draped in  the usual sterile fashion. The anterior urethra was normal, the prostate had  partial obstruction and was small.  Just inside the bladder neck at 9:00  around the right side, there was a 1 cm papillary tumor.  There were no  other lesions seen in the rest of the bladder with a rigid scope. A left  retrograde pyelogram was obtained using the cone tip catheter and left  ureter and pyelocaliceal system were unremarkable with no filling defects  and no obstruction. Unfortunately the Fluoro unit was unable to save the  picture for permanent record.  I then used the cold cup biopsy forceps and  was able to obtain a portion of this papillary tumor for  pathology but it  was very difficult to reach with the 12 degree lens.  I then used the  Administrator with the 70 degree lens and using the Bugbee electrode, I  was able to completely fulgurate and destroy the rest of this lesion and the  surrounding mucosal edges.  I then put in the flexible cystoscope to make  sure there were no other inaccessible lesions that were missed and this was  the only lesion. I saw no other intravesical abnormalities.  The bladder was  emptied, the scope removed and the patient sent to the recovery room in good  condition having tolerated the procedure well.  Maretta Bees. Vonita Moss, M.D.    LJP/MEDQ  D:  03/31/2004  T:  03/31/2004  Job:  [id number]   cc:   [facility]

## 2011-04-14 NOTE — Op Note (Signed)
[facility]  Patient:    Andrew [name], Andrew [name]                         MRN: [id number] Proc. Date: 07/11/00 Attending:  Quita Skye. Hart Rochester, M.D. CC:         Dyke Maes, M.D.   Operative Report  PREOPERATIVE DIAGNOSIS:  End-stage renal disease with functional, but inadequate, AV fistula, left upper arm.  POSTOPERATIVE DIAGNOSIS:  End-stage renal disease with functional, but inadequate, arteriovenous fistula, left upper arm.  OPERATION: 1. Ligation of left upper arm AV fistula. 2. Insertion of left forearm AV Gore-Tex graft from brachial artery to basilic    vein (4 mm x 7 mm stretch.)  SURGEON:  Quita Skye. Hart Rochester, M.D.  FIRST ASSISTANT:  Nurse.  ANESTHESIA:  Local.  PROCEDURE:  The patient was taken to the operating room and placed in the supine position at which time the left upper extremity was prepped with Betadine scrubbing solution and draped in routine sterile manner.  After infiltration of 1% Xylocaine, a transverse incision was made in the antecubital area through the previous scar and the basilic vein was carefully dissected free and preserved for insertion of the graft.  It was a 5 mm vein. There was a functional AV fistula from the cephalic vein anastomosed to the brachial artery and this was dissected free.  It had been inadequate as far as use of hemodialysis.  After dissecting free the fistula, the vein was ligated and about a 2 cm stump of cephalic vein to the brachial artery was preserved. After infiltrating with 0.5% Xylocaine, a 4 mm x 7 mm stretch Gore-Tex graft was delivered through the tunnel.  The patient was given no heparin during this procedure.  The stump of the vein which was anastomosed to the brachial artery was then opened with a #15 blade, extended with the [name] scissors.  The 4 mm graft was slightly spatulated and anastomosed end-to-end using continuous 6-0 Prolene.  The 7 mm end of the graft was spatulated and then  anastomosed end-to-side to the basilic vein which was the 5 mm vein.  The clamps were then released and there was an excellent pulse and thrill in the graft.  Wounds were irrigated with saline.  Adequate hemostasis was achieved and closed in layers of Vicryl in a subcuticular fashion.  Sterile dressing was applied.  The patient was taken to the recovery room in satisfactory condition. DD:  07/11/00 TD:  07/11/00 Job: [id number]

## 2011-04-14 NOTE — Procedures (Signed)
NAME:  Andrew [name], LOEFFELHOLZ NO.:  [id number]   MEDICAL RECORD NO.:  [id number]          PATIENT TYPE:  INP   LOCATION:  A213                          FACILITY:  [facility]   PHYSICIAN:  [place] Bing, M.D. [facility] OF BIRTH:  [birth year]   DATE OF PROCEDURE:  10/16/2005  DATE OF DISCHARGE:                                  ECHOCARDIOGRAM   REFERRING PHYSICIAN:  Patrica Duel, M.D. and [place] Bing, M.D. [facility]   CLINICAL DATA:  A 77 year old gentleman with congestive heart failure,  hypertension, coronary disease, and end-stage renal disease. M-mode aorta  3.1, left atrium 5.0, septum 1.4, posterior wall 1.2, LV diastole 5.0, LV  systole 3.7.   FINDINGS:  1.  Technically adequate echocardiographic study.  2.  Mild left atrial enlargement; normal right atrial size.  3.  Normal right ventricular size and function; no RVH.  4.  Mild sclerosis of a trileaflet aortic valve; mild annular calcification.  5.  Normal tricuspid and pulmonic valve; no tricuspid regurgitation.  6.  Normal mitral valve; mild annular calcification.  7.  Normal left ventricular size with borderline hypertrophy. There is a      small area of hypokinesis of the base of the inferior wall. Overall      capital LV systolic function is otherwise normal.  8.  Comparison with prior study of 03/31/2003: Small segmental wall motion      abnormality now noted as described.      [place] Bing, M.D. [facility]  Electronically Signed     RR/MEDQ  D:  10/16/2005  T:  10/17/2005  Job:  [id number]

## 2011-04-14 NOTE — Op Note (Signed)
NAME:  Andrew [name], Andrew [name]                ACCOUNT NO.:  [id number]   MEDICAL RECORD NO.:  [id number]          PATIENT TYPE:  AMB   LOCATION:  SDS                          FACILITY:  [facility]   PHYSICIAN:  [name], M.D.    DATE OF BIRTH:  December 19, 1925   DATE OF PROCEDURE:  10/30/2006  DATE OF DISCHARGE:  10/30/2006                               OPERATIVE REPORT   PREOPERATIVE DIAGNOSIS:  End stage renal disease.   POSTOPERATIVE DIAGNOSIS:  End stage renal disease.   PROCEDURE:  Creation of right upper arm arteriovenous fistula.   SURGEON:  [name], M.D.   ASSISTANT:  Nurse.   ANESTHESIA:  MAC.   COMPLICATIONS:  None.   DISPOSITION:  To the recovery room stable.   PROCEDURE IN DETAIL:  The patient was taken to the operating room and  placed in the supine position where the are of the right arm was prepped  and draped in the usual sterile fashion.  An incision was made over the  antecubital space and carried down to isolate the cephalic vein which  was of good caliber and the brachial artery, which was also of good  caliber.  Tributary branches were ligated and divided off the cephalic  vein.  The cephalic vein was ligated at the antecubital space and was  divided and mobilized to the level of the brachial artery.  The artery  was occluded proximally and distally and was opened with an 11 blade and  extended longitudinally with Potts scissors.  The vein was spatulated  and sewn end-to-side to the artery with a running 6-0 Prolene suture.  The clamps were removed and an excellent thrill was noted.  The wounds  were irrigated with saline, hemostasis with electrocautery.  The wound  was closed with 3-0 Vicryl in the subcutaneous and subcuticular tissue.  Benzoin and Steri-Strips were applied.      [name], M.D.  Electronically Signed     TFE/MEDQ  D:  10/30/2006  T:  10/30/2006  Job:  [id number]

## 2011-04-14 NOTE — Discharge Summary (Signed)
   NAME:  Andrew [name], Andrew [name] NO.:  [id number]   MEDICAL RECORD NO.:  [id number]                  PATIENT TYPE:   LOCATION:                                       FACILITY:   PHYSICIAN:  Kirk Ruths, M.D.            DATE OF BIRTH:   DATE OF ADMISSION:  DATE OF DISCHARGE:                                 DISCHARGE SUMMARY   CHIEF COMPLAINT:  Shortness of breath.   PRESENTING ILLNESS:  This is an elderly white male who is admitted with  pulmonary edema.   The patient is a dialysis patient who had dialysis 3 days before and not  compliant with his fluid restrictions. The patient was admitted for urgent  dialysis.  The patient received dialysis first thing in the morning after  admission with the resolution of his shortness of breath symptoms. He was  admitted by the hospitalist.  He will be discharged home, encouraged fluid  restrict himself.                                               Kirk Ruths, M.D.    WMM/MEDQ  D:  05/25/2003  T:  05/25/2003  Job:  [id number]

## 2011-04-14 NOTE — Discharge Summary (Signed)
NAME:  Andrew [name], [facility] NO.:  [id number]   MEDICAL RECORD NO.:  [id number]          PATIENT TYPE:  INP   LOCATION:  A209                          FACILITY:  [facility]   PHYSICIAN:  Patrica Duel, M.D.    DATE OF BIRTH:  [birth year]   DATE OF ADMISSION:  02/03/2007  DATE OF DISCHARGE:  03/15/2008LH                               DISCHARGE SUMMARY   DISCHARGE DIAGNOSES:  1. Urosepsis, possible catheter sepsis.  Good response to therapy.  2. End-stage renal disease on chronic hemodialysis managed by [facility] in [place].  3. Atherosclerotic cardiovascular disease, status post coronary aorta      bypass grafting in 1998.  4. History of hypertension.  5. Hyperlipidemia.  6. Remote cerebrovascular accident.  7. History of renal artery stenosis and status post stenting of the      left renal artery.  8. History of congestive heart failure related to fluid overload, last      EF measured 50%.  9. 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  He underwent cystoscopy and left      retrograde pyelogram several days prior to admission as an      outpatient.  10.Arteriovenous fistula for dialysis with marginal function; Ash      catheter currently in use.   DETAILS REGARDING ADMISSION:  Please refer to the admitting history.  Briefly, this 77 year old male with above history did well after his  urologic procedure as noted above until approximately 24 hours prior to  presentation to this hospital.  He developed increasingly severe  weakness, arthralgia and chills/rigor.  He had only mild cough but no  other complaints.   In the emergency department, the patient was found be febrile but  hemodynamically stable.  He was mildly lethargic.  Initial workup  revealed a white count of 15,000, H&H of 11.9 of 36.2 and a platelet  count of 135,000.  His electrolytes were normal except for expected  elevation of BUN and creatinine.  Blood gas was good.   He was admitted with what appeared to be urosepsis, and he was noted to  be at high risk for catheter sepsis as well.   COURSE IN THE HOSPITAL:  The patient was treated empirically with  Levaquin.  Initial cultures were positive for E. coli sensitive to  Rocephin.  He ultimately had gram-positive cocci from a blood culture  obtained from the catheter.  Vancomycin was empirically begun at that  point.   The patient continued to do well on the above regimen.  Final culture  results are pending and will be pursued as an outpatient.  Information  has been relayed to the patient's attending nephrologist.   DISPOSITION:  He is to continue his chronic medications which include  Toprol XL 50 mg daily, Prevacid 15 mg nightly, allopurinol 100 mg daily,  Renagel 4 tablets p.o. t.i.d., amlodipine 10 mg nightly, colchicine 0.6  daily, Lipitor or 10 mg nightly, Isordil 30 mg daily, aspirin 81 daily,  Dialyvite 1  p.o. daily, MiraLax 1 daily and Sensipar 30 mg p.o. nightly.  He will be receiving IV antibiotics through the Dialysis Service.      Patrica Duel, M.D.  Electronically Signed     MC/MEDQ  D:  02/25/2007  T:  02/25/2007  Job:  [id number]

## 2011-04-14 NOTE — Op Note (Signed)
NAME:  CELESTINO, ACKERMAN NO.:  [id number]   MEDICAL RECORD NO.:  [id number]          PATIENT TYPE:  OIB   LOCATION:  2899                         FACILITY:  [facility]   PHYSICIAN:  [name], M.D.    DATE OF BIRTH:  [birth year]   DATE OF PROCEDURE:  10/11/2004  DATE OF DISCHARGE:                                 OPERATIVE REPORT   PREOPERATIVE DIAGNOSIS:  End-stage renal disease with occluded left upper  arm arteriovenous Gore-Tex graft.   POSTOPERATIVE DIAGNOSIS:  End-stage renal disease with occluded left upper  arm arteriovenous Gore-Tex graft.   OPERATION PERFORMED:  Thrombectomy and interposition jump graft revision to  higher axillary vein of left upper arm arteriovenous Gore-Tex graft.   SURGEON:  [name], M.D.   ASSISTANT:  Rowe Clack, P.A.-C.   ANESTHESIA:  MAC.   COMPLICATIONS:  None.   DISPOSITION:  Recovery room stable.   DESCRIPTION OF PROCEDURE:  The patient was taken to the operating room and  placed in supine position where the area of the left arm was prepped and  draped in the usual sterile fashion.  Incision was made over the prior  axillary incision and carried down to isolate the graft to vein anastomosis.  This was an end-to-end anastomosis.  The graft was opened near the venous  anastomosis.  The venous anastomosis was thrombectomized. There was a  moderate stenosis at the venous anastomosis with 5 dilator passing through  the venous anastomosis with some moderate resistance.  This was flushed with  heparinized saline and reoccluded.  Next, the graft itself was  thrombectomized.  The arterialized plug was removed and excellent flow was  encountered.  This was flushed with heparinized saline and reoccluded.  The  vein was exposed proximal to the stenosis.  There was also a narrowing at a  valve proximal to the prior venous anastomosis.  For this reason, the vein  was exposed further proximal to this and the venous anastomosis in  this  hypertrophied vein valve was excised.  The vein above this was of excellent  caliber. A 7 mm Gore-Tex graft was brought onto the field, was spatulated  and sewn end-to-end to the vein with a running 6-0 Prolene suture. This was  flushed with heparinized saline and reoccluded.  Next, this was cut to  appropriate length and was sewn end-to-end to the old graft with a running 6-  0 Prolene suture.  Clamps were removed and  excellent thrill was noted.  The wounds were irrigated with saline.  Hemostasis was obtained with electrocautery, wounds were closed with 3-0  Vicryl in the subcutaneous and subcuticular tissue.  Benzoin and Steri-  Strips were applied.      Todd   TFE/MEDQ  D:  10/11/2004  T:  10/11/2004  Job:  [id number]

## 2011-04-14 NOTE — Consult Note (Signed)
NAME:  Andrew [name], Andrew [name] NO.:  [id number]   MEDICAL RECORD NO.:  [id number]          PATIENT TYPE:  INP   LOCATION:  A213                          FACILITY:  [facility]   PHYSICIAN:  [place] Bing, M.D. [facility] OF BIRTH:  [birth year]   DATE OF CONSULTATION:  10/16/2005  DATE OF DISCHARGE:                                   CONSULTATION   REFERRING PHYSICIAN:  Dr. Nobie Putnam.   PRIMARY CARDIOLOGIST:  Dr. Dorethea Clan.   HISTORY OF PRESENT ILLNESS:  A 77 year old gentleman referred for assessment  of congestive heart failure. Andrew [name] history of coronary disease dates  to at least 72 when he underwent CABG surgery. A subsequent  catheterization in 2004 revealed severe native three-vessel disease with  patent grafts and normal left ventricular systolic function. Andrew [name] had  been doing well until the past week when he experienced progressive dyspnea  on exertion culminating in dyspnea at rest with severe wheezing. On  presentation to the emergency department, pulmonary edema was noted. With  initial dialysis, he has improved.   Andrew [name] has end-stage renal disease followed by [facility] with every  Monday, Wednesday and Friday dialysis. His dry weight has been 89 kg and has  been stable. He does occasionally have shortened dialysis sessions related  to severe muscle cramping, but this has not impaired the ability to maintain  his fluid status. He has not experienced significant pedal edema. There is  been no orthopnea nor PND.   He does describe some chest discomfort. This is vague and located in the  very upper portion of the mid chest. He noted that it was nothing like the  angina he had prior to bypass surgery.   Other cardiovascular risk factors include hypertension and hyperlipidemia.  He also has cerebrovascular disease and has suffered a prior CVA. He has  peripheral vascular disease, previously requiring percutaneous intervention  to the left renal  artery.   Past medical history is otherwise notable for a history of bladder cancer  and renal cell cancer.   RECENT MEDICATIONS:  1.  Atorvastatin 10 mg q.d.  2.  Toprol 50 mg q.d.  3.  Aspirin 81 milligram q.d.  4.  Allopurinol 100 mg q.d.  5.  Amlodipine 5 mg q.d.  6.  Imdur 30 mg q.d.   ALLERGIES:  He reports no allergies.   FAMILY HISTORY:  Notable for respiratory failure at an advanced age in his  mother and the premature death of his 63 year old father due to blood  clot, possibly pulmonary embolism. One brother who underwent stent  placement in his 25s.   REVIEW OF SYSTEMS:  Notable for a one-week history of nasal discharge,  marked increase in cough and wheezing in the past week. He had some similar  symptoms in the past treated with a metered dose inhaler. There is no  significant history of tobacco use. He continues to produce a small amount  of urine each day. He has had constipation in the past. All other systems  reviewed and are negative.   PHYSICAL EXAMINATION:  GENERAL:  On  exam, vigorous older gentleman in no  acute distress.  VITAL SIGNS:  Temperature is 98.6, heart rate 80 and regular, respirations  19, blood pressure 160/80. Weight 88 kg; weight on admission was 98 kg. O2  saturation 98% on 4 liters.  HEENT:  Anicteric sclerae; normal lids and conjunctivae; normal oral mucosa.  NECK:  Mild to moderate jugular venous distension; normal carotid upstrokes  with bruits versus transmitted murmur bilaterally.  LUNGS:  Few basilar rhonchi; minimal basilar rales on the left; moderate  expiratory wheezing and prolongation of the expiratory phase.  CARDIAC:  Normal first heart sound; preserved aortic component and second  heart sounds; grade 2/6 basilar systolic ejection murmur.  ABDOMEN:  Soft and nontender; normal bowel sounds; no organomegaly; no  masses.  EXTREMITIES:  No edema; distal pulses intact.  NEUROMUSCULAR:  Symmetric strength and tone; normal cranial  nerves.  MUSCULOSKELETAL: No joint deformities.  PSYCHIATRIC:  Normal orientation; normal affect.   LABORATORY DATA:  Laboratory notable for hemoglobin of 11.9, initial  negative point of care markers, BNP level of 1,570. EKG:  Normal sinus  rhythm; nondiagnostic inferior Q-waves - cannot exclude prior in inferior  myocardial infarction; minor nonspecific ST-T-wave abnormalities; possible  left atrial abnormality; possible LVH.   IMPRESSION:  Andrew [name] presents fluid overloaded with weight that is  substantially higher than his typical dry weight. He reports no significant  dietary indiscretion. It sounds as if he has been very compliant with his  medical regime and very stable on dialysis. It may be that his appropriate  dry weight is somewhat lower than where it has been maintained. The  likelihood is that this is a dialysis issue rather than a cardiac issue;  however, he does have known coronary disease and a significant murmur, which  likely represents very mild aortic stenosis or aortic sclerosis. An  echocardiogram will be obtained. Serial cardiac markers are pending. If  these studies are unremarkable, no further cardiac evaluation will probably  be warranted. A new dry weight should be established for him, and he should  be returned to stable outpatient hemodialysis. He continues to be somewhat  fluid overloaded and would benefit from another dialysis treatment in  hospital. We appreciate the request for consultation and will be happy to  assist with the care of this very nice gentleman.      [place] Bing, M.D. [facility]  Electronically Signed     RR/MEDQ  D:  10/16/2005  T:  10/16/2005  Job:  [id number]

## 2011-04-14 NOTE — H&P (Signed)
NAME:  Andrew [name], Andrew [name] NO.:  [id number]   MEDICAL RECORD NO.:  [id number]                   PATIENT TYPE:  INP   LOCATION:                                       FACILITY:  [facility]   PHYSICIAN:  Arturo Morton. Riley Kill, M.D.             DATE OF BIRTH:  May 19, 1926   DATE OF ADMISSION:  07/27/2003  DATE OF DISCHARGE:                                HISTORY & PHYSICAL   CHIEF COMPLAINT:  Chest pain and shortness of breath.   HISTORY OF PRESENT ILLNESS:  The patient is a very delightful 77 year old  gentleman with a history of end-stage renal disease on hemodialysis.  After  dialysis today, he developed cold sweat and dizziness.  It felt like he was  going to pass out, associated with some anterior chest tightness.  He has  had an approximate two year history of some mild exertional chest tightness  but usually relieved by rest.  He decided to drive to the hospital.  In the  emergency room, he received oxygen, baby aspirin, and nitroglycerin and felt  better and wanted to go home.  However, the feeling was that he should be  transferred to [facility] for further evaluation.  He had  a similar episode in 1998 but not nearly as bad.  He gets dialysis on  Monday, Wednesday, and Friday.   PAST MEDICAL HISTORY:  1. Remarkable for hypertension.  2. End-stage renal disease with hemodialysis.  3. Hyperlipidemia.  4. Status post vertebral cerebrovascular accident.  5. History of transitional carcinoma of the bladder with multiple     cystoscopies for recurrence.  6. History of prior nephrectomy for renal cell cancer.  7. History of renal artery stenosis status post percutaneous transluminal     angioplasty of the left renal artery.  8. History of congestive heart failure.  9. Status post coronary artery bypass graft x 4 in 1998 with echocardiogram     in 2004 revealing EF of 50%, left atrial enlargement, moderate septal     dyssynergy.   ALLERGIES:   There are no known drug allergies.   MEDICATIONS:  1. Allopurinol 100 mg q.d.  2. Colchicine 0.6 mg p.o. b.i.d.  3. Aspirin 81 mg q.d.  4. Lipitor 10 mg q.d.  5. Nephro-Vite 1 q.d.  6. Quinine 325 mg with hemodialysis.  7. Renagel 800 mg.  8. Toprol 50 mg q.d.  9. Vitamin E 400 IU q.d.  10.      Clonidine 0.2 mg q.d.   REVIEW OF SYMPTOMS:  Negative for fever or chills.  He does have a headache  associated with nitroglycerin.  He has had some chest pain.  He does have  some weakness that occurs after dialysis.  Other complaints include stiff  knees.  Review of systems is otherwise negative.   PHYSICAL EXAMINATION:  GENERAL:  He is alert, oriented, and pleasant  gentleman in no acute distress.  VITAL SIGNS:  Temperature is 99.3, weight is 191.8, pulse 90, respiratory  rate 20, and blood pressure 205/102.  SAO2 is 99% on two liters.  HEENT:  Unremarkable.  NECK:  Supple.  LUNGS:  Lung fields are clear to auscultation and percussion.  CARDIOVASCULAR:  Rhythm is regular with a soft systolic ejection murmur.  ABDOMEN:  Soft without hepatosplenomegaly.  There is an AV shunt in the left  arm.  There are two IVs in the right arm.   LABORATORY DATA:  Chest x-ray reveals cardiomegaly with mild congestion,  right lower lobe atelectasis.  This is from report only.  EKG reveals sinus  rhythm with prior inferior wall myocardial infarction and occasional  premature ventricular contractions.   The hemoglobin is 15.9, hematocrit 48.4, white count 7900, platelet count  138,000.  BUN 12, creatinine 2.9, glucose 98, potassium 4.0.  CK is 50, MB  3.9, troponin 0.03.  INR is 1.0.   IMPRESSION:  1. Status post coronary artery bypass graft surgery x 4 with a history of     exertional angina.  Today with chest pain and diaphoresis.  2. End-stage renal disease.  3. Status post nephrectomy.  4. History of renal artery stenosis.  5. Hypertension.  6. Hyperlipidemia.  7. Transitional cell carcinoma  of the bladder.  8. Prior cerebrovascular accident.   RECOMMENDATIONS:  The patient has been transferred for cardiac  catheterization.  Renal consult will be obtained.  He will need dialysis.  We will resume his medicines.                                                Arturo Morton. Riley Kill, M.D.    TDS/MEDQ  D:  07/27/2003  T:  07/27/2003  Job:  [id number]   cc:   Patrica Duel, M.D.  [address]  [place]  [place] [address]  Fax: [phone]   Kirk Ruths, M.D.  [address]  [place]  [place] [address]  Fax: [phone]   Dorise Hiss, M.D.  [address]  [place]  [place] [address]  Fax: [phone]   [place] Bing, M.D.   Vida Roller, M.D.  Fax: [phone]   Jorja Loa, M.D.  [address]  [place]  [place] [address]  Fax: [phone]

## 2011-04-14 NOTE — Op Note (Signed)
[facility]  Patient:    Andrew [name], Andrew [name]                       MRN: [id number] Proc. Date: 09/12/00 Adm. Date:  [id number] Disc. Date: [id number] Attending:  Colvin Caroli CC:         Dyke Maes, M.D.   Operative Report  PREOPERATIVE DIAGNOSIS:  Rule out bladder carcinoma.  POSTOPERATIVE DIAGNOSIS:  Rule out bladder carcinoma.  OPERATION:  Cystoscopy, left retrograde pyelogram with interpretation, and cold cut bladder biopsy and fulguration of possible bladder tumor.  SURGEON:  Maretta Bees. Vonita Moss, M.D.  ANESTHESIA:  General.  INDICATIONS:  This is a 77 year old white male with creatinine in the range of 8 to 9 and chronic renal insufficiency previously underwent a left nephroureterectomy for left ureteral carcinoma.  He came in for followup, underwent cystoscopy, and had a worrisome area in the right side of the bladder.  He is brought in to the OR today for biopsy and evaluation of left kidney.  DESCRIPTION OF PROCEDURE:  The patient was brought to the operating room and placed in the lithotomy position.  External genitalia were prepped and draped in the usual fashion.  Using cystoscope, the anterior urethra was unremarkable. The prostatic urethra was negative.  The bladder had a papillary, pebbly looking area I think what would be in the vicinity of old right ureteral orifice.  There was also another slightly worrisome area on the right bladder wall with some slightly raised mucosa.  I used a 5-French whistle tip ureteral catheter and did a left retrograde pyelogram, and the left pyelocaliceal system and ureter were unremarkable with no filling defects or obstruction.  Films were taken that will be kept in my office.  I then used the cold cut bladder biopsy forceps and biopsied this lesion on the right bladder floor and sent tissue for pathology and fulgurated it.  The lesion on the right bladder wall was a little hard to  reach to biopsy, so instead I used the Bugbee electrode and fulgurated this area to destroy any possible tumor in this region.  The tumor on the bladder floor was about 1 cm in size, and the other tumor on the lateral wall was about 0.5 cm.  The bladder was emptied, the scope removed, and the patient was sent to the recovery room in good condition having tolerated the procedure well. DD:  09/12/00 TD:  09/12/00 Job: [id number]

## 2011-04-14 NOTE — Op Note (Signed)
   NAME:  Andrew [name], Andrew [name]                          ACCOUNT NO.:  [id number]   MEDICAL RECORD NO.:  [id number]                   PATIENT TYPE:  AMB   LOCATION:  NESC                                 FACILITY:  [facility]   PHYSICIAN:  Maretta Bees. Vonita Moss, M.D.             DATE OF BIRTH:  1926-09-08   DATE OF PROCEDURE:  05/12/2003  DATE OF DISCHARGE:                                 OPERATIVE REPORT   PREOPERATIVE DIAGNOSIS:  History of bladder carcinoma and right nephrectomy  for transitional cell carcinoma, with recent positive NMP-22 test despite a  negative cystoscopy.   PROCEDURES:  Cystoscopy, left retrograde pyelogram with interpretation, and  left ureteroscopy.   SURGEON:  Maretta Bees. Vonita Moss, M.D.   ANESTHESIA:  General.   PROCEDURE:  The patient was brought to the operating room and placed in  lithotomy position.  He was cystoscoped and the anterior urethra was  unremarkable.  The prostatic urethra was unremarkable.  There were no  lesions in the bladder.  A cone-tip catheter was used to perform a left  retrograde pyelogram and despite flushing of the catheter with contrast,  some air bubbles were introduced and they were obviously mobile and moving  up and down the ureter, but there were no tissue filling defects or  obstruction noted.  With the guidewire in place, I did ureteroscope the  distal left ureter, since that was the area at most risk, and there was no  distal ureteral TCC seen.  The guidewire was removed from the left ureter  and the bladder was emptied and all scopes removed and the patient sent to  the recovery room in good condition.  I will cystoscope him again in three  months.                                               Maretta Bees. Vonita Moss, M.D.    LJP/MEDQ  D:  05/12/2003  T:  05/12/2003  Job:  [id number]   cc:   [facility]

## 2011-04-14 NOTE — H&P (Signed)
NAME:  Andrew [name], Andrew [name]                          ACCOUNT NO.:  [id number]   MEDICAL RECORD NO.:  [id number]                   PATIENT TYPE:  INP   LOCATION:  A214                                 FACILITY:  [facility]   PHYSICIAN:  Hanley Hays. Dechurch, M.D.           DATE OF BIRTH:  [birth year]   DATE OF ADMISSION:  05/25/2003  DATE OF DISCHARGE:                                HISTORY & PHYSICAL   HISTORY OF PRESENT ILLNESS:  This is the second admission in eight weeks for  this 77 year old Caucasian male [facility] by [facility]  and Dorise Hiss, M.D. is his nephrologist, with a history of end stage  renal disease on dialysis, who presented with acute worsening of shortness  of breath.  It is noted over the past 2-3 days that he has been more short  of breath, particularly at night.  However, this evening, the shortness of  breath acutely worsened to the point he could not lay down or get  comfortable.  He became diaphoretic because of ongoing symptoms.  He drove  himself to the emergency room where he presented in marked distress.  He was  hypertensive, hypoxemic on room air and having chest pain.  Apparently, he  was given nitroglycerin with some relief as well as a nitroglycerin drip.  Chest x-ray was consistent with acute pulmonary edema.  The patient  symptomatically improved, and he is being admitted to the hospital for acute  dialysis.  The patient describes himself with tightness which developed with  a shortness of breath but now is resolved.  His initial CPK was 100 with a  relative index of 5.3, troponin of 0.04.  EKG reveals no acute ischemic  changes.  The patient states he has been compliant with his fluid  restriction though allows himself extra as he is nonoliguric.  He denies any  dietary discretion.  He has been compliant with hemodialysis.  However,  apparently he gets hypotensive and did require some fluid replacement on  Friday secondary to  hypotension.   ALLERGIES:  NONE KNOWN.   MEDICATIONS:  1. Renagel 800 mg a.c.  2. Nephro-Vite one daily.  3. Enteric coated aspirin 81 mg daily.  4. Allopurinol 100 mg daily.  5. Toprol XL 50 mg daily.  6. Lipitor 10 mg at supper.  7. Denies any over-the-counter medications or any other supplements.   PAST MEDICAL HISTORY:  1. Significant for transitional cell carcinoma status post right     ureteronephrectomy in 2001.  2. He has been status post multiple cystoscopies for recurrent bladder     carcinoma with fulguration, most recently being May 13, 2003, performed     secondary to an increasing NMP 22.  3. Coronary artery disease status post CABG in 1998, stable.  4. Hypertension.  5. Gout.  6. History of renal artery stenosis status post left renal angioplasty.  7. He has a left  AV fistula.   REVIEW OF SYSTEMS:  In addition to above, no chills or fever.  No cough.  Usually very active and independent.  Mows his lawn with a push mower as  well as a riding mower.  He has had no changes or exercise intolerance to  the last three days.  Usually no orthopneas.  Dry weight is 90 kg.  Remaining review of systems is negative.   SOCIAL HISTORY:  He is a widow.  He has three daughters, one in [place].  Tobacco he quit about 20 years ago.  Occasional alcohol use.   FAMILY HISTORY:  Noncontributory.   PHYSICAL EXAMINATION:  GENERAL APPEARANCE:  Well-developed, well-nourished  Caucasian male who is in no distress at this time with O2 in place.  He is  mildly tachypneic at 22.  His O2 saturations are 98% on 4 liters.  Blood  pressure 176/90, pulse 90.  NEUROLOGIC:  Alert and oriented with nonfocal neurologic exam.  Gait is not  tested, but again, he did walk into the emergency room.  HEENT:  He has JVD to his mandible.  The oral mucosa is moist without  lesions.  Conjunctivae are non injected.  LUNGS:  Coarse rhonchi with rales in the bottom third posteriorly.  EXTREMITIES:   Reveals 1-2+ edema, left greater than right which is chronic.  ABDOMEN:  Soft, nontender.  HEART:  Regular, distant, could not appreciate a murmur.  SKIN:  Without rash, lesion or breakdown.   LABORATORY DATA:  In addition to above, hemoglobin 14.7, white count normal,  CK 100, relative index 5.3, potassium 5.9, glucose 119.   ASSESSMENT/PLAN:  1. Acute pulmonary edema and chest pain secondary to volume overload in a     dialysis patient, probably due to dietary noncompliance.  He will need     some further education and monitoring.  2. History of recurrent bladder CA status post right ureter nephrectomy     secondary to transitional cell CA.  Continues to be followed by urology.  3. Left renal artery stenosis status post angioplasty.  4. Coronary artery disease, stable.  He is on aspirin and Toprol alone.  No     changes to his regimen at this time.   PLAN:  Plan was discussed with the patient who has good understanding.  Primary care physician will need to educate the patient regarding his fluid  issues.                                               Hanley Hays Josefine Class, M.D.    FED/MEDQ  D:  05/25/2003  T:  05/25/2003  Job:  [id number]

## 2011-04-14 NOTE — Discharge Summary (Signed)
NAME:  Andrew [name], Andrew [name] NO.:  [id number]   MEDICAL RECORD NO.:  [id number]          PATIENT TYPE:  INP   LOCATION:  A213                          FACILITY:  [facility]   PHYSICIAN:  Patrica Duel, M.D.    DATE OF BIRTH:  19-Jun-1926   DATE OF ADMISSION:  10/15/2005  DATE OF DISCHARGE:  11/21/2006LH                                 DISCHARGE SUMMARY   DISCHARGE DIAGNOSES:  1.  Pulmonary edema precipitated by fluid overload.  2.  History of congestive heart failure on dialysis as well for end-stage      renal disease.  3.  Atherosclerotic cardiovascular disease, status post coronary artery      bypass graft in 1998, status post catheterization in 2004, which      revealed severe native three-vessel disease with patent grafts and      normal left ventricular function.  4.  Long-standing hypertension.  5.  Hyperlipidemia.  6.  History of minor cerebrovascular accident with minimal residual.  7.  History of transitional cell carcinoma of the bladder, status post      multiple cystoscopies for recurrence.  8.  Status post nephrectomy for renal cell carcinoma.  9.  Renal artery stenosis (left), status post percutaneous transluminal      angioplasty.   HISTORY OF PRESENT ILLNESS:  For details of regarding admission, please  refer to the admitting note.  Briefly, this 77 year old male with the above  history had been doing remarkably well on three time weekly dialysis.  He  presented to the emergency department with a 24- to 48-hour history of  increasingly severe shortness of breath, cough and wheezing as well as  orthopnea.  Congestive heart failure was confirmed by x-ray as well as  markedly elevated BNP.   The patient had experienced some chest pain which was vague.  It responded  well with nitroglycerin drip.  He was admitted to the ICU for congestive  heart failure, possibly on the basis of acute coronary syndrome likely fluid  overload from end-stage renal disease.   HOSPITAL COURSE:  The patient was admitted to ICU and Dr. Kristian Covey  immediately performed dialysis and removed 3 L of fluid.  Within 12 hours,  the patient was markedly improved.  He was moved to the regular floor for  further management.  He was dialyzed on another occasion and was stable for  discharge on hospital day #2.  Cardiology has seen the patient and cleared  him from that standpoint.  Markers were negative.  Status was stable.   DISCHARGE MEDICATIONS:  1.  Toprol XL 50 mg daily.  2.  Lipitor 10 mg daily.  3.  Ecotrin 81 mg daily.  4.  Allopurinol 100 mg daily.  5.  Norvasc.  6.  Imdur.  7.  Nephro-Vite.      Patrica Duel, M.D.  Electronically Signed     MC/MEDQ  D:  11/25/2005  T:  11/26/2005  Job:  [id number]

## 2011-04-14 NOTE — H&P (Signed)
NAME:  Andrew [name], Andrew [name] NO.:  [id number]   MEDICAL RECORD NO.:  [id number]          PATIENT TYPE:  INP   LOCATION:  6743                         FACILITY:  [facility]   PHYSICIAN:  Dyke Maes, M.D.DATE OF BIRTH:  03-22-1926   DATE OF ADMISSION:  12/10/2007  DATE OF DISCHARGE:  12/11/2007                              HISTORY & PHYSICAL   REASON FOR ADMISSION:  Hyperkalemia.   HISTORY OF PRESENT ILLNESS:  Andrew [name] is a very pleasant 77 year old  Caucasian male with a past medical history that is significant for end-  stage renal disease on chronic hemodialysis Monday, Wednesday, Friday at  [facility].  He also has a significant history of  hypertension, hyperlipidemia and congestive heart failure.  The patient  presented to short stay this morning for a right arm fistulogram  performed by Dr. Myra Gianotti in the cath lab.  Apparently, the patient had  been having some difficulties with cannulation in the hemodialysis unit.  He had not had a full hemodialysis treatment since the prior Monday.  The patient was attempted cannulation somewhat infiltrated and then was  sent to follow up with Dr. Myra Gianotti.  The patient was found to have a  potassium of 6.7 prior to procedure and was given Kayexalate 60 grams.  However 4-5 hours later, potassium still high at 6.2 and Dr. Myra Gianotti  called the attending for further instruction.  It was noted in the cath  lab prior to procedure that unfortunately the patient's right arm  fistula was clotted and he was not able to perform a thrombolysis in the  vascular lab.  Therefore, Dr. Myra Gianotti was able to place a right internal  jugular vein dialysis catheter and we are admitting the patient for  emergent hemodialysis in questionable intervention in the morning.   The patient complains of being hungry, overall doing well.  He has had  no chest pain or shortness of breath.  No fevers or chills.   ALLERGIES:  No known drug  allergies.   MEDICATIONS:   CURRENT MEDICATIONS:  Unavailable in the short stay area.   PAST MEDICAL HISTORY:  See above.  Also:  1. History of renal artery stenosis status post stenting of the left      renal artery.  2. Hyperlipidemia.  3. Remote cerebrovascular accident.  4. Transitional cell carcinoma of the bladder with recent reoccurrence      status post multiple procedures.  Past cystoscopy and left      retrograde pyelogram.  5. Atherosclerotic cardiovascular disease status post coronary aortic      bypass grafting in 1998.  6. History of urosepsis possibly catheter related sepsis.   SOCIAL HISTORY:  The patient currently lives in [place] with his  wife.  He is somewhat hard of hearing.  He does not smoke and does not  drink.  He does have a daughter who is the primary care giver.   FAMILY HISTORY:  Noncontributory.   REVIEW OF SYSTEMS:  GENERAL: No recent weight changes or decrease in  appetite.  HEENT: No headaches, no blurry vision.  No  change in vision.  No hearing changes.  No tinnitus.  CARDIOVASCULAR:  No chest pain, no  shortness of breath, no palpitations.  PULMONARY:  No shortness of  breath.  No cough and congestion.  ABDOMEN:  No abdominal pain.  No  nausea, no vomiting.  No change in bowel movements.  No melena.  No  hematochezia.  EXTREMITIES:  No known edema.  No cyanosis, no clubbing.  SKIN:  No rashes, no itching.  ENDOCRINE:  No history of diabetes.   PHYSICAL EXAMINATION:  VITAL SIGNS: Blood pressure 140/70, heart rate 82  temperature 98.2.  GENERAL:  Andrew [name] is seen post internal jugular  placement in the cath lab.  He is doing well.  He is alert and oriented  x3.  No acute distress.  NECK:  Supple.  No JVD is noted.  HEART:  Regular rate and rhythm.  CHEST:  Murmur 2/6 heard best right apex.  LUNGS:  Decreased breath sounds extreme bases, otherwise clear to  auscultation.  ABDOMEN:  Positive bowel sounds, soft, nontender to palpation in  all  quadrants.  EXTREMITIES:  No bilateral lower extremity edema.   LABORATORY DATA:  Repeat laboratory data pending.   ASSESSMENT/PLAN:  As discussed with Dr. Primitivo Gauze who was in to  see and evaluate this patient:  1. Hyperkalemia the patient to be dialyzed emergently today via his      new right internal jugular catheter.  We will follow up potassium      in the a.m.  2. End-stage renal disease, see #1.  I spoke with Dr. Myra Gianotti today,      and it is unfortunate regarding the patient's fistula.  We will      attempt to send him down to Radiology in the morning and attempt to      have them perform a thrombolysis.  This has been arranged, and I      have spoken with the tech's regarding this.  3. Hypertension.  Blood pressure stable.  Continue current regimen      once I have the patient's medication list.  4. Coronary artery disease.  Continue current regimen.  5. Anemia.  Continue the patient's Epogen dose.  Follow-up hemoglobin.  6. Secondary hyperparathyroidism.  Continue patient's Os-Cal as well      as vitamin D.   DISPOSITION:  Plan on the patient trying attempts thrombolysis in the  morning, and if this is successful, pull vascular catheter prior to  discharge.      Azucena Fallen, PA    ______________________________  Dyke Maes, M.D.    MY/MEDQ  D:  12/23/2007  T:  12/23/2007  Job:  [id number]   cc:   [facility]   [facility]  Vein and Vascular Surgery

## 2011-04-14 NOTE — Op Note (Signed)
. [facility]  Patient:    Andrew [name], Andrew [name] Visit Number: [id number] MRN: [id number]          Service Type: DSU Location: [facility] [id number] [id number] Attending Physician:  Alyson Locket Dictated by:   [name], M.D. Proc. Date: 04/25/02 Admit Date:  04/25/2002 Discharge Date: 04/25/2002                             Operative Report  PREOPERATIVE DIAGNOSIS:  End-stage renal disease with occluded left forearm loop arteriovenous Gore-Tex graft.  POSTOPERATIVE DIAGNOSIS:  End-stage renal disease with occluded left forearm loop arteriovenous Gore-Tex graft.  OPERATION PERFORMED: 1. Placement of new left upper arm arteriovenous Gore-Tex graft. 2. Placement of right internal jugular Ash catheter.  SURGEON:  [name], M.D.  ASSISTANT:  Dominica Severin, P.A.  ANESTHESIA:  MAC.  COMPLICATIONS:  None.  DISPOSITION:  To recovery room stable.  DESCRIPTION OF PROCEDURE:  The patient was taken to the operating room and placed in supine position where the area of the left arm was prepped and draped in the usual sterile fashion.  Using local anesthesia, an incision was made over the above elbow brachial artery.  The artery was encircled with a vessel loop and was of good caliber.  Next, a separate incision was made in the axilla and the axillary vein was isolated at well.  The vein was of excellent caliber.  A tunnel was created from the level of the brachial artery to the axillary vein and a 6 mm Gore-Tex graft was brought through the tunnel. The vein was occluded proximally and distally and was opened with an 11 blade and extended longitudinally with Potts scissors.  The graft was spatulated and sewn end-to-side to the vein with a running 6-0 Prolene suture.  Clamps were removed. The graft was flushed with heparinized saline and re-occluded.  Next the brachial artery was occluded proximally and distally and was opened with an 11 blade and extended  longitudinally with Potts scissors.  The graft was sewn end-to-side to the artery with a running 6-0 Prolene suture.  Clamps were removed and an excellent thrill was noted.  The wounds were irrigated with saline and hemostasis was obtained with electrocautery.  Wounds were closed with 3-0 Vicryl in the subcutaneous and subcuticular tissue.  Benzoin and Steri-Strips were applied.  Next, attention was turned to the neck which was prepped and draped in the usual sterile fashion.  An ultrasound was used to localize the internal jugular vein on the right which was of good caliber. Using local anesthesia and a finder needle with the patient in a Trendelenburg position, the right internal jugular vein was identified. Next, using a Seldinger technique, a guide wire was passed down to the level of the right atrium.  A dilator and a peel-away sheath was passed over this and a 28 cm Diatek catheter was passed through the peel-away sheath which was then removed as well.  The catheter was then tunneled out to the level of the clavicle and the two-port connection was placed on the end of the catheter.  Both lumens flushed and aspirated easily and were locked with 1000 units per cc of heparin.  The catheter was secured to the skin with a 3-0 nylon stitch and the entry site was closed with a subcuticular Vicryl stitch.  Sterile dressing was applied.  The patient was taken to the recovery room where chest x-ray  was obtained. Dictated by:   [name], M.D. Attending Physician:  Alyson Locket DD:  04/25/02 TD:  04/28/02 Job: [id number]

## 2011-04-14 NOTE — Discharge Summary (Signed)
   NAME:  Andrew [name], Andrew [name]                          ACCOUNT NO.:  [id number]   MEDICAL RECORD NO.:  [id number]                   PATIENT TYPE:  INP   LOCATION:  A202                                 FACILITY:  [facility]   PHYSICIAN:  Kirk Ruths, M.D.            DATE OF BIRTH:  07/03/1926   DATE OF ADMISSION:  03/29/2003  DATE OF DISCHARGE:  03/31/2003                                 DISCHARGE SUMMARY   DISCHARGE DIAGNOSES:  1. Congestive heart failure secondary to volume overload and renal failure.  2. Hyperkalemia.  3. Nitroglycerin.  4. End-stage renal disease on dialysis.  5. Hypertension.   HOSPITAL COURSE:  This 77 year old white male was admitted through the  emergency room by Dr. Letitia Libra, the hospitalist, with shortness of breath  and swelling.  The patient, at the time of admission, his blood pressure was  significantly elevated and complaining of chest pain. He was admitted to ICU  on nitroglycerin drip; and also underwent emergent dialysis on the Sunday  evening when he was admitted.  The patient's initial potassium was 6.9.  His  blood pressure was 240 systolic. During dialysis blood pressure dropped and  nitro was discontinued.  His shortness of breath improved.  His weight was  down 8 pounds after dialysis.  Initial BUN and creatinine were 46 and 9.3;  the following day it was at 35 and 7.7.  Hemoglobin was 12.4.  The patient  was seen in consultation by Dr. Kristian Covey.  Cardiac enzymes were negative.  The patient had beta naturetic peptide of 1160 indicating congestive heart  failure.   The patient remained hospitalized for 2 days undergoing dialysis, plus  observation. He was back to his baseline with his respiratory status and  renal status.  Echocardiogram was basically within normal range.  Cardiac  enzymes were negative.  This gentleman went into congestive heart failure  from fluid overload which resolved after dialysis.   DISCHARGE INSTRUCTIONS:  He is to  continue his previous medications as well  as his dialysis regimen.  He is on Renagel, Nephro-Vite, quinine,  allopurinol, Toprol 2 daily, Lipitor 10, and colchicine p.r.n.                                               Kirk Ruths, M.D.    WMM/MEDQ  D:  04/17/2003  T:  04/17/2003  Job:  [id number]

## 2011-04-14 NOTE — Cardiovascular Report (Signed)
NAME:  Andrew [name], BLANK NO.:  [id number]   MEDICAL RECORD NO.:  [id number]                   PATIENT TYPE:  INP   LOCATION:  4742                                 FACILITY:  [facility]   PHYSICIAN:  Carole Binning, M.D. [facility]         DATE OF BIRTH:  [birth year]   DATE OF PROCEDURE:  DATE OF DISCHARGE:                              CARDIAC CATHETERIZATION   PROCEDURE PERFORMED:  Right and left heart catheterization with coronary  angiography and left ventriculography and bypass graft angiography.   INDICATIONS:  Mr. Lorenson is a 77 year old male with previous coronary artery  bypass surgery.  He was admitted on transfer from [facility] after  he presented with an episode of diaphoresis, lightheadedness, and chest  discomfort.  He was referred for cardiac catheterization.   PROCEDURAL NOTE:  A 6-French sheath was placed in the right femoral artery.  Native coronary angiography was performed using a 6-French JL4 and JR4  catheters.  The saphenous vein graft to the right coronary artery was imaged  with a JR4 catheter.  The saphenous vein graft to the obtuse marginal branch  was imaged with an LCD catheter.  The left internal mammary artery was then  imaged with an internal mammary catheter.  Left ventriculography performed  with an angled pigtail catheter.  Contrast was Omnipaque.  There were no  complications.   RESULTS:  HEMODYNAMICS:  Left ventricular pressure 182/28.  Aortic pressure was initially 210/96.  This did improve with intravenous labetalol.  There was no aortic valve  gradient.   LEFT VENTRICULOGRAM:  There appears to be mild hypokinesis of inferoapical wall but overall  ejection fraction in the low range of normal.  Ejection fraction is  estimated at 55%.  There is no mitral regurgitation.   CORONARY ARTERIOGRAPHY:  (Right dominant)  Left main has a distal 70% stenosis.   Left anterior descending artery has an ostial 70% stenosis.   The proximal  LAD has an 80% stenosis proximal to the second diagonal branch.  The LAD  gives rise to a small first diagonal branch which is diffusely diseased.  There is a large second diagonal branch which has a 40% stenosis at its  origin and a 30% stenosis in the mid vessel.  The mid and distal LAD fill  via left internal mammary graft.   Left circumflex has a 70% stenosis proximally followed by 100% occlusion of  the mid vessel just after a small first obtuse marginal branch.  The second  and third obtuse marginal branch fill via saphenous vein graft.   Right coronary artery has an 80% stenosis proximally followed by 100%  occlusion in the mid vessel.  The distal right coronary artery fills via  saphenous vein graft.   Left internal mammary artery to the distal LAD is patent filling the mid and  distal LAD as well as retrograde into second diagonal branch.  There is  mild  diffuse disease in the apical LAD of approximately 30% severity.   Sequential saphenous vein graft to the second and third obtuse marginal  branches is patent throughout its course filling a large second obtuse  marginal branch and a small third obtuse marginal branch.   Saphenous vein graft to the distal right coronary artery is patent filling  the distal right coronary artery which consists of a large posterior  descending artery and two normal sized posterolateral branches.   IMPRESSION:  1. Left ventricular systolic function in the lower range of normal.  2. Native three vessel coronary artery disease.  3. Status post coronary artery bypass surgery with patent grafts as     described.  The patient does appear to be fully revascularized.   PLAN:  The patient will be managed medically.                                               Carole Binning, M.D. [facility]    MWP/MEDQ  D:  07/28/2003  T:  07/28/2003  Job:  [id number]   cc:   Andrew [name], M.D.  [address], [address]  [place]  [place]  [address]  Fax: [phone]   [place] Bing, M.D.   Dorise Hiss, M.D.  [address]  [place]  [place] [address]  Fax: [phone]

## 2011-04-14 NOTE — Consult Note (Signed)
NAME:  Andrew [name], Andrew [name] NO.:  [id number]   MEDICAL RECORD NO.:  [id number]          PATIENT TYPE:  INP   LOCATION:  A213                          FACILITY:  [facility]   PHYSICIAN:  Andrew [name], M.D.DATE OF BIRTH:  June 08, 1926   DATE OF CONSULTATION:  10/15/2005  DATE OF DISCHARGE:                                   CONSULTATION   REFERRING PHYSICIAN:  Patrica [name], M.D.   REASON FOR CONSULTATION:  CHF.   HISTORY OF PRESENT ILLNESS:  [name] is a 77 year old, Caucasian male who  has past medical history of hypertension, history of end-stage renal  disease, history of renal artery stenosis, status post angioplasty, history  of CHF presently with increased shortness of breath, orthopnea and  paroxysmal nocturnal dyspnea.  According to the patient, he was dialyzed on  Friday and discharged home with his baseline dry weight.  However, today, he  started having difficulty and he was found to be 9 pounds over his dry  weight and admitted because of congestive heart failure.  The patient denies  any fever, chills or sweating.  Presently, he is being taken care of by  [facility], Dr. Lowell [name].  The patient denies any nausea or vomiting.   PAST MEDICAL HISTORY:  1.  History of hyperlipidemia.  2.  History of hypertension.  3.  History of end-stage renal disease on maintenance hemodialysis.  4.  History of CVA.  5.  History of prior nephrectomy for renal CA.  6.  History of renal artery stenosis, status post angioplasty.  7.  History of CHF.  8.  History of coronary artery disease, status post CABG.   CURRENT MEDICATIONS:  1.  Lipitor 10 mg p.o. daily.  2.  Toprol XL 50 mg p.o. daily.  3.  Ecotrin 81 mg p.o. daily.  4.  Allopurinol 100 mg p.o. daily.  5.  Norvasc 5 mg p.o. daily.  6.  Imdur 30 mg p.o. daily.  7.  Renagel 800 mg three tablets p.o. t.i.d.  8.  Epogen.  9.  Nitroglycerin drip for relief of shortness of breath.  10. Oxygen 5 L.   SOCIAL  HISTORY:  No history of smoking.  No history of alcohol abuse.  The  patient was doing reasonably good with his dialysis.   ALLERGIES:  No known drug allergies.   REVIEW OF SYSTEMS:  CARDIOPULMONARY:  Mainly shortness of breath, orthopnea  and wheezing.  Denies any chest pain.  GENITOURINARY:  No urgency or  frequency.  He has had constipation for which he was put on medication,  according to him, about 1 week ago.   PHYSICAL EXAMINATION:  VITAL SIGNS:  Blood pressure 116/69, pulse 83,  respirations 20, temperature 99.2, O2 saturations 95% on 4 L.  CHEST:  He has bilateral respiratory crackles.  Wheezing.  HEART:  Regular rate and rhythm with no murmur.  No S3.  ABDOMEN:  Soft, positive bowel sounds.  EXTREMITIES:  Trace edema.   LABORATORY DATA AND X-RAY FINDINGS:  ABG with pH 7.36, pCO2 46 and oxygen  saturations 97%.  White blood cell  count 9.1, hemoglobin 11.9, hematocrit  34.4.  Sodium 134, BUN 42, creatinine 9.7.  BNP 1570.  CPK-MB fraction is  5.5, troponin 0.05.   Chest x-ray has bilateral interstitial increased markings and also  cardiomegaly.   ASSESSMENT:  1.  Congestive heart failure.  The patient got his dialysis on Friday and      presently came in with shortness of breath and wheezing with chest x-ray      consistent with fluid overload.  The patient clinically in congestive      heart failure possibly from uncontrolled salt and fluid intake.      According to him, he has gained about 9-10 pounds since his dialysis.  2.  End-stage renal disease.  BUN and creatinine within acceptable range.      Normal potassium.  3.  History of anemia.  Hemoglobin and hematocrit is 11.9 and 34.4 on      Epogen.  4.  Hyperphosphatemia.  He is on Renagel.  5.  History of hypertension.  He is on Toprol XL 50 mg p.o. daily.  6.  Norvasc 5 mg p.o. daily.  His blood pressure is controlled very well.  7.  History of gout.  He is on allopurinol.  8.  Hypercholesterolemia.  He is on  Lipitor.  9.  Coronary artery disease, status post coronary artery bypass graft.   RECOMMENDATIONS:  Will try to make arrangement for him.  Will try to get  about 3 L tonight and once we start him on dialysis, will probably stop his  nitroglycerin.  Will continue his other medications and put him back on  Epogen.      Andrew [name], M.D.  Electronically Signed     BB/MEDQ  D:  10/15/2005  T:  10/16/2005  Job:  [id number]

## 2011-04-14 NOTE — Op Note (Signed)
[facility]  Patient:    Andrew [name], Andrew [name]                       MRN: [id number] Proc. Date: 05/15/01 Adm. Date:  [id number] Attending:  Lauree Chandler CC:         Dyke Maes, M.D.   Operative Report  PREOPERATIVE DIAGNOSIS:  Recurrent bladder carcinoma.  POSTOPERATIVE DIAGNOSIS:  Recurrent bladder carcinoma.  PROCEDURE:  Cystoscopy and cold cut bladder biopsy and excision of small bowel tumor and left retrograde pyelogram with interpretation.  SURGEON:  Dr. Larey Dresser.  ANESTHESIA:  General.  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 He had a suspicious lesion in the bladder in October 2001 but the biopsy turned out to be inflammation and no malignancy. Cystoscopy earlier this month showed a small papillary tumor on the floor of the bladder. The patient is brought to the OR today for removal of this lesion and left retrograde pyelogram.  DESCRIPTION OF PROCEDURE:  The patient was brought to the operating room and placed in lithotomy position, external genitalia were prepped and draped in the usual fashion. He was cystoscoped and the anterior urethra and prostatic urethra were unremarkable. On the floor of the bladder, there was a small papillary tumor less than a centimeter in size. This was resected with the cold cut bladder biopsy forceps and the resection site was fulgurated with the Bugbee electrode. A left retrograde pyelogram was obtained with an open ended catheter in the left ureter and pyelocaliceal system were perfectly normal with no filling defects, obstruction or stones. The bladder was emptied, the cystoscope removed, the patient sent to the recovery room in good condition. DD:  05/15/01 TD:  05/15/01 Job: [id number]

## 2011-04-14 NOTE — Discharge Summary (Signed)
NAME:  Andrew [name], [place]                          ACCOUNT NO.:  [id number]   MEDICAL RECORD NO.:  [id number]                   PATIENT TYPE:  INP   LOCATION:  4742                                 FACILITY:  [facility]   PHYSICIAN:  [place] Bing, M.D.               DATE OF BIRTH:  1926-08-31   DATE OF ADMISSION:  07/27/2003  DATE OF DISCHARGE:  07/28/2003                           DISCHARGE SUMMARY - REFERRING   DISCHARGE DIAGNOSES:  1. Admitted with two year history of mild exertional chest tightness     relieved with rest, and exacerbated chest tightness after hemodialysis on     July 27, 2003.  2. After the left heart catheterization, left ventricular systolic function     in lower range of normal, ejection fraction 55%, no mitral regurgitation.  3. The patient is status post coronary artery bypass graft surgery in 1998     with finding of patent bypass grafts on left heart catheterization on     July 27, 2003.   SECONDARY DIAGNOSES:  1. Hypertension.  2. End-stage renal disease.  Hemodialysis at [facility]     Monday, Wednesday, and Friday.  3. Hyperlipidemia.  4. Status post vertebral cerebrovascular accident.  5. History of transitional cell carcinoma of the bladder with multiple     cystoscopies for recurrence.  6. History of prior nephrectomy for renal cell cancer.  7. History of renal artery stenosis, status post percutaneous transluminal     angioplasty of the left renal artery.  8. History of congestive heart failure.  9. Status post coronary artery bypass graft surgery in 1998, echocardiogram     in 2004, revealing an ejection fraction of 50%, left atrial enlargement,     and moderate septal dyssynergy.   PROCEDURE:  1. On July 28, 2003, right and left heart catheterization with coronary     angiography and left ventriculography and bypass graft angiography.  The     study shows the following:  a.  Left ventriculogram showing ejection     fraction  55%, no mitral regurgitation, mild hypokinesis of the inferior     apical wall.  2. Coronary arteriography:     a. The left main has a distal 70% stenosis.     b. The left anterior descending ostial 70% stenosis, proximal LAD 80%        stenosis proximal to the second diagonal, LAD gives rise to a small        first diagonal diffusely diseased, large second diagonal has a 40%        stenosis at the origin, 30% mid point stenosis.  The mid and distal        LAD fill by way of patent left internal mammary artery graft.     c. The left circumflex has a 70% proximal stenosis followed by an 100%        occlusion of the mid  vessel after a first obtuse marginal.  The second        and third obtuse marginal fill by way of a sequential saphenous vein        graft.     d. Right coronary artery has an 80% stenosis proximally, followed by 100%        mid point occlusion, distal coronary artery fills by way of a        saphenous vein graft.     e. The left internal mammary artery to the distal LAD is patent.     f. The sequential saphenous vein graft to the second and third obtuse        marginal is patent, and a reverse saphenous vein graft to the distal        right coronary artery is patent.   The patient will continue medical management.  The patient has been stable  since his catheterization.  The right groin site is without ecchymoses,  swelling, or bruit.  He has good perfusion to the right lower extremity.  He  has had no further episodes of chest pain during this hospitalization.  He  goes home with the following medications:   DISCHARGE MEDICATIONS:  1. Allopurinol 100 mg daily.  2. Colchicine 0.6 mg twice daily.  3. Enteric coated aspirin 81 mg daily.  4. Lipitor 10 mg daily.  5. Nephro-Vite daily.  6. Quinine 325 mg with hemodialysis.  7. Renagel 800 mg a.c.  8. Toprol XL 50 mg daily.  9. Clonidine 0.2 mg daily.  10.      Vitamin E 400 international units daily.  11.      Tylenol  325 mg one to two tabs q.4-6h. as needed for pain.   ACTIVITY:  He is asked not to drive, nor lift any heavy weight, nor to  engage in sexual intercourse for the next two days.   DISCHARGE DIET:  Renal diet.   WOUND CARE:  He may shower.   FOLLOW UP:  1. He is to call the [place] office of [facility] at [phone] if     there is any swelling in the right groin or increased pain at the     catheterization site.  2. He is asked to set up an office visit with Dr. Anne Hahn for Thursday,     July 30, 2003, or Tuesday, August 04, 2003, since he missed office     visit planned for Tuesday, July 28, 2003.  It is at this visit that     adjustment of medications for hypertension which have been persistent in     the post catheterization period here will be discussed.  He was recently     taken off Toprol XL 100 mg daily dose at [facility] due to     decreased heart rates.  This has increased heart rate sufficiently, but     he will need additional blood pressure control.  3. He is to visit the office of [facility] in [place] in two     weeks for a post catheterization checkup.  The [place] office will     call to make that appointment.   BRIEF HISTORY:  Andrew [name] is a 77 year old gentleman with a history  of end-stage renal disease on hemodialysis.  After hemodialysis on July 27, 2003, he developed a cold sweat and dizziness.  He felt as if he were  going to pass out, and it was associated with  some anterior chest tightness.  He has had an approximate two year history of some mild exertional chest  tightness which is usually relieved by rest.  He decided to drive to the  hospital.  In the emergency room, he received oxygen, baby aspirin,  nitroglycerin, he felt better.  However, the feeling was there that he  should be transferred to [facility] for further evaluation.  He had a similar episode to this in 1998, but not nearly as  persistent or as bad.  He gets hemodialysis Monday, Wednesday, and Friday.  He will be scheduled  for a left heart catheterization on Tuesday, July 28, 2003.   HOSPITAL COURSE:  After arrival at [facility] he was experiencing  no further chest pain after treatment in the emergency room at [facility],  [place].  He underwent left and right heart catheterization on July 28, 2003, by Dr. Loraine Leriche Pulsipher.  The results have been dictated above.  He has  had a satisfactory post procedure recovery course.  He has had some  persistent elevated blood pressure.  The patient tells me that his blood  pressures usually run in the 140 to 150 range systolic, but at times they do  crest at 180.  His blood pressures post procedure have been 170 to 180  systolic.   It is felt best that he undergo hemodialysis on his regularly scheduled  hemodialysis day, Wednesday, July 29, 2003, and that he seek attention  from Dr. Anne Hahn at the earliest opportunity for blood pressure medication  adjustment which can be monitored at the three time weekly hemodialysis  sessions.      Maple Mirza, P.A.                    [place] Bing, M.D.    GM/MEDQ  D:  07/28/2003  T:  07/28/2003  Job:  [id number]   cc:   Patrica Duel, M.D.  [address]  [place]  [place] [address]  Fax: [phone]   Dorise Hiss, M.D.  [address]  [place]  [place] [address]  Fax: [phone]   [facility]  fax is [phone]   [facility]  fax is [phone]   Jorja Loa, M.D.  [address]  [place]  [place] [address]  Fax: [phone]

## 2011-04-14 NOTE — H&P (Signed)
NAME:  Andrew, [name] NO.:  [id number]   MEDICAL RECORD NO.:  [id number]                   PATIENT TYPE:  EMS   LOCATION:  ED                                   FACILITY:  [facility]   PHYSICIAN:  Gracelyn Nurse, M.D.              DATE OF BIRTH:  January 27, 1926   DATE OF ADMISSION:  03/29/2003  DATE OF DISCHARGE:                                HISTORY & PHYSICAL   CHIEF COMPLAINT:  Shortness of breath.   HISTORY OF PRESENT ILLNESS:  This 76 year old white male presents with  worsening shortness of breath.  He says he has had shortness of breath over  the past month to the point that he has had to get up in the middle of the  night to catch his breath.  This morning he became much worse.  He is now  dropping his saturations into the 80s, and he is now experiencing some chest  pain.   PAST MEDICAL HISTORY:  1. End-stage renal disease.     A. Hemodialysis on Monday, Wednesday, Friday.  2. Coronary artery disease.     A. Status post CABG.  3. Hypertension.  4. Gout.  5. Hyperlipidemia.  6. Status post right nephrectomy secondary to renal cell carcinoma.  7. Left arm AV graft.   ALLERGIES:  No known drug allergies.   CURRENT MEDICATIONS:  1. Renagel 800 mg q.a.c.  2. Nephro-Vite daily.  3. Quinine sulfate 260 mg q.h.s. p.r.n.  4. Aspirin 81 mg daily.  5. Allopurinol 100 mg daily.  6. Toprol-XL 50 mg daily.  7. Lipitor 10 mg daily.  8. Colchicine 0.6 mg p.r.n.   PHYSICAL EXAMINATION:  VITAL SIGNS:  Temperature is 98, pulse 60,  respirations 28, blood pressure 177/85.  GENERAL:  This is a well-nourished white male who is in mild respiratory  distress.  HEENT:  Pupils are equal, round, and reactive to light.  Extraocular  movements intact.  Oral mucosa is moist.  Oropharynx is clear.  CARDIOVASCULAR:  Regular rate and rhythm.  No murmurs.  LUNGS:  There are bibasilar crackles.  ABDOMEN:  Obese, nontender, nondistended.  Bowel sounds are positive.  EXTREMITIES:  With 2+ lower extremity edema.  NEUROLOGIC:  Cranial nerves II-XII grossly intact.  No focal deficits.  SKIN:  Moist.  With no rash.   ADMITTING LABORATORIES:  Chest x-ray shows pulmonary edema.   EKG shows normal sinus rhythm with some mild ST depression in the lateral  leads of less than 1 mm.   White blood count 7.8, hemoglobin 12.4, platelets 181.  Sodium is 136,  potassium is 6.9, chloride is 99, CO2 is 28, BUN 46, creatinine 9.3, glucose  is 91.  CK is 90, BNP is 1160, troponin I is 0.03.   ASSESSMENT AND PLAN:  1. Congestive heart failure, renal versus cardiac.  It is likely fluid     overload from the  decreased renal function so will ask Dr. Kristian Covey to     emergently dialyze him.  I will give him nitroglycerin to get some     vasodilation for relief and put him on oxygen.  His first set of cardiac     enzymes are negative, but will go ahead and get a full set and also get a     2-D echo and get a cardiology consult.  I doubt Lasix would help because     of his poor renal function.  2. Hyperkalemia.  He needs emergent dialysis.  Will give him insulin D-50     for now.  3. Chest pain.  No acute change on EKG.  Will give him some nitroglycerin,     start a nitroglycerin drip.  This is likely secondary to his fluid     overload.  4. End-stage renal disease.  Dialysis as above.  5. Hypertension.  Will start him on some nitroglycerin.  This will likely     improve after dialysis.                                               Gracelyn Nurse, M.D.    JDJ/MEDQ  D:  03/29/2003  T:  03/29/2003  Job:  [id number]

## 2011-04-14 NOTE — Op Note (Signed)
[place]  Patient:    Andrew [name], Andrew [name] Visit Number: [id number] MRN: [id number]          Service Type: DSU Location: [facility] 2899 26 Attending Physician:  Bennye Alm Dictated by:   Di Kindle Edilia Bo, M.D. Proc. Date: 01/09/02 Admit Date:  01/09/2002 Discharge Date: 01/09/2002                             Operative Report  PREOPERATIVE DIAGNOSIS:  Chronic renal failure with clotted left forearm arteriovenous graft.  POSTOPERATIVE DIAGNOSIS:  Chronic renal failure with clotted left forearm arteriovenous graft.  OPERATION PERFORMED: 1. Thrombectomy of left forearm arteriovenous graft. 2. Insertion of new segment of graft to the more proximal brachial vein. 3. Exploration of arterial anastomosis.  SURGEON:  Di Kindle. Edilia Bo, M.D.  ASSISTANT:  Nurse.  ANESTHESIA:  Local with sedation.  DESCRIPTION OF PROCEDURE:  The patient was taken to the operating room and sedated by Anesthesia.  The entire left upper extremity was prepped and draped in the usual sterile fashion.  After the skin was infiltrated with 1% lidocaine, the incision on the venous anastomosis was opened in no acute distress and the venous limb of the graft dissected free.  Likewise the arterial end of the graft was dissected free.  The tourniquet was placed on the upper arm and after the patient was heparinized, the arm was exsanguinated and the tourniquet inflated to 250 mHg.  Under tourniquet control, the venous limb of the graft was divided and the graftotomy was made must distal to the arterial anastomosis.  The graft thrombectomy was achieved using a #4 Fogarty catheter.  The venous anastomosis was quite irregular and I decided to jump higher on the vein.  The arterial anastomosis was inspected and the artery was small.  However, it was patent.  There were no problems with the body of the graft itself.  The graftotomy distal to the arterial  anastomosis was closed with a running CB6PTFE suture.  The graft was then clamped and the tourniquet was released.  A separate longitudinal incision was made above the antecubital space after the skin was anesthetized.  Here the higher brachial vein was dissected free and ligated distally and spatulated proximally.  A new segment of 7 mm graft was sewn end-to-end to the vein after it it was spatulated using continuous CB6PTFE suture.  It was then brought through the two incisions, tunneled between the two incisions and sewn end-to-end to the old graft using continuous 6-0 Prolene suture.  At the completion there was an excellent thrill in the graft.  Hemostasis was obtained in the wounds and the wounds were closed in a deep layer of 3-0 Vicryl and the skin closed with 4-0 Vicryl. A sterile dressing was applied.  The patient tolerated the procedure well and was transferred to the recovery room in satisfactory condition.  All needle and sponge counts were correct. Dictated by:   Di Kindle Edilia Bo, M.D. Attending Physician:  Bennye Alm DD:  01/09/02 TD:  01/10/02 Job: [id number]

## 2011-04-14 NOTE — H&P (Signed)
NAME:  Andrew [name], Andrew [name] NO.:  [id number]   MEDICAL RECORD NO.:  [id number]          PATIENT TYPE:  INP   LOCATION:  A213                          FACILITY:  [facility]   PHYSICIAN:  Patrica Duel, M.D.    DATE OF BIRTH:  1926/04/29   DATE OF ADMISSION:  10/15/2005  DATE OF DISCHARGE:  LH                                HISTORY & PHYSICAL   CHIEF COMPLAINT:  Shortness of breath.   HISTORY OF PRESENT ILLNESS:  This is a 77 year old male with a relatively  complicated medical history.  He has a history of coronary artery disease  status post bypass grafting x4 in 1998.  His last echocardiogram on record  revealed an ejection fraction of 50%.  He also has end-stage renal disease  and is on chronic hemodialysis and is managed by Dr. Kristian Covey.  He has  longstanding hypertension, hyperlipidemia, and is status post a relatively  minor CVA.  He also has a history of transitional cell carcinoma of the  bladder with multiple cystoscopies for recurrence as well as a prior  nephrectomy for renal cell carcinoma.  He has had percutaneous transluminal  angioplasty of the left renal artery for renal artery stenosis.  He has a  history of congestive heart failure.   The patient has been doing remarkably well on twice-weekly hemodialysis.  He  presented to the emergency department with a 24 to 48-hour history of  increasingly severe shortness of breath and cough as well as wheezing.  Dr.  Margretta Ditty diagnosed congestive heart failure, confirmed by chest x-ray as  well as a markedly elevated brain natriuretic peptide of 1500.   The patient has experienced some chest pain which is somewhat vague in  character.  A nitroglycerin drip was begun with some relief of his symptoms.  Of note, EKG showed nonspecific changes only.  His pain is essentially  resolved.   There has been no history of headache, neurologic deficits, syncope,  palpitations, significant change in orthopnea, peripheral edema,  nausea,  vomiting, diarrhea, melena, hematemesis, hematochezia, or genitourinary  symptoms.   The patient was dialyzed on October 13, 2005.  His dry weight at that time  was 195, admitting weight 205 (clothed).   The patient was admitted with apparent heart failure, rule out acute  coronary syndrome associated with this.  Emergent dialysis is indicated.   CURRENT MEDICATIONS:  1.  Toprol-XL 50 daily.  2.  Lipitor 10 daily.  3.  Ecotrin 81 mg daily.  4.  Allopurinol 100 daily.  5.  Norvasc.  6.  Imdur.  7.  Nephrovite.   ALLERGIES:  NONE KNOWN.   PAST MEDICAL HISTORY:  As noted above.   FAMILY HISTORY:  Noncontributory.   REVIEW OF SYSTEMS:  Negative, except as mentioned.   SOCIAL HISTORY:  The patient lives alone and is very independent.   PHYSICAL EXAMINATION:  GENERAL:  A pleasant, fully alert white male in no  acute distress.  VITAL SIGNS:  On presentation, blood pressure 178/80, pulse 83, respirations  21, O2 saturation 100%.  HEENT:  Normocephalic, atraumatic.  Pupils  equal.  Ears, nose, throat  benign.  NECK:  Supple.  There is 2 cm of JVD at 30 degrees.  LUNGS:  Scattered wheezes.  At this time, there are no rales (post-dialysis)  as reported by emergency room physician.  HEART:  Heart sounds are essentially normal.  No audible gallops are noted.  No murmurs.  ABDOMEN:  Nontender, nondistended.  Bowel sounds are intact.  EXTREMITIES:  No clubbing, cyanosis, or edema at this time.  Peripheral  pulses marginal.  NEUROLOGIC:  Nonfocal.   ASSESSMENT:  Apparent congestive heart failure in a 77 year old white male  with complicated medical history, as noted above.  At this point, he has  responded well to dialysis.  We need to consider the possibility of ischemic  etiology or other primary cardiac problem.   PLAN:  Dr. Kristian Covey has been consulted.  Dialysis has been performed with  good response.  We will consult Dr. Dorethea Clan for full cardiac evaluation.  Will  follow and treat expectantly.      Patrica Duel, M.D.  Electronically Signed     MC/MEDQ  D:  10/16/2005  T:  10/16/2005  Job:  [id number]

## 2011-04-14 NOTE — Consult Note (Signed)
NAME:  Andrew [name], Andrew [name] NO.:  [id number]   MEDICAL RECORD NO.:  [id number]          PATIENT TYPE:  INP   LOCATION:  A209                          FACILITY:  [facility]   PHYSICIAN:  Jorja Loa, M.D.DATE OF BIRTH:  05-23-1926   DATE OF CONSULTATION:  DATE OF DISCHARGE:                                 CONSULTATION   NEPHROLOGY CONSULTATION   REASON FOR CONSULTATION:  End-stage renal disease for hemodialysis.   ATTENDING/REFERRING PHYSICIAN:  Patrica Duel, M.D.   HISTORY OF PRESENT ILLNESS:  [name] is a 77 year old gentleman with  past medical history of coronary artery disease and a history of  hypertension, CVA with end-stage renal disease presently came with the  complaints of fever, weakness.  When he examined, the patient was found  to have fever and thought to be possibly secondary to a urinary tract  infection.  Started on antibiotics, admitted to the hospital for further  workup and management..  Presently consult is called because of the need  for hemodialysis.  [name] also complains of some difficulty in  breathing.   PAST MEDICAL HISTORY:  As stated above, history of CVA.  History of a  prior nephrectomy for renal CA, history of hypertension, hyperlipidemia,  history of renal artery stenosis, status post angioplasty, history of  coronary artery disease, status post CABG, history of CHF.   SOCIAL HISTORY:  No history of smoking.  No history of alcohol abuse.   ALLERGIES:  No known allergies.   MEDICATIONS:  His medication's consist of allopurinol 100 mg p.o. daily,  Norvasc 10 mg p.o. daily, aspirin 81 mg p.o. daily. __________ mg p.o.  q.h.s., Epogen 3900 units after each dialysis.  He is also on Imdur 30  mg p.o. daily, Toprol 50 mg p.o. daily, Protonix 40 mg p.o. daily,  MiraLax 17 grams p.o. q.h.s., Nephro-Vite 1 tablet p.o. daily, Zocor 20  mg p.o. daily, other medications are p.r.n. medications.   REVIEW OF SYSTEMS:  At this moment he  feels better.  He denies any  nausea or vomiting, but complains of some fever, chills.  He denies any  cough.  No chest pain.  He has also some urgency but no swelling of the  legs.   PHYSICAL EXAMINATION:  VITAL SIGNS:  On examination his temperature is  101, blood pressure 155/69, heart rate of 90.  CHEST:  Chest is clear to auscultation.  No rales, no rhonchi.  No  egophony.  HEART:  His heart exam reveals regular rate and rhythm.  No murmur.  ABDOMEN:  Soft, positive bowel sounds.  EXTREMITIES:  No edema.   LABORATORY DATA:  His white blood cell count is 15.2, hemoglobin 11.9,  hematocrit 36.2 and platelet count of 135,000.  Sodium 131, potassium  5.1, BUN 45, creatinine 10.0.  UA cloudy, specific gravity 1.02.  Nitrite is negative, he has a small leukocytes and he has many bacteria.  White blood cell count is 11,220, red blood cells 11-20 and white blood  cells in the urine are 20-50.  A urine culture is pending.  Blood  culture within the last 24 hours was no growth.   ASSESSMENT:  1. Fever with leukocytes, turbid urine, probably urinary tract      infection.  The patient presently on antibiotics.  His urine      culture is pending.  Culture of blood or over the last 24 hours is      no growth.  2. End-stage patient.  BUN and creatinine still within acceptable      range.  He has some shortness of breath, not significant.  Probably      mild fluid overload, normal potassium.  3. Hypertension.  Blood pressure seems to be reasonably controlled.  4. History of coronary artery disease, no chest pain.  5. History of cerebrovascular accident.  6. History of gout. He is on allopurinol and he is asymptomatic.  7. Hyperphosphatemia.  He is on a binder.  8. History of hypercholesterolemia.   RECOMMENDATIONS:  At this moment will initiate dialysis.  Will try to  get past information from his unit and follow that, will try to remove  probably about 3 liters today.  Will continue his  other medications and  will follow the patient until his discharge from the hospital.  Once he  is discharged, he will go back to [facility] from [place]  where  he is followed as an outpatient.      Jorja Loa, M.D.  Electronically Signed     BB/MEDQ  D:  02/04/2007  T:  02/04/2007  Job:  [id number]

## 2011-04-14 NOTE — Op Note (Signed)
NAME:  Andrew [name], Andrew [name]                          ACCOUNT NO.:  [id number]   MEDICAL RECORD NO.:  [id number]                   PATIENT TYPE:  OBV   LOCATION:  1825                                 FACILITY:  [facility]   PHYSICIAN:  Janetta Hora. Fields, MD               DATE OF BIRTH:  08-19-1926   DATE OF PROCEDURE:  01/04/2004  DATE OF DISCHARGE:  01/03/2004                                 OPERATIVE REPORT   PROCEDURE PERFORMED:  Thrombectomy and revision of left upper arm  arteriovenous graft.   PREOPERATIVE DIAGNOSIS:  Thrombosed arteriovenous graft with end-stage renal  disease.   POSTOPERATIVE DIAGNOSIS:  Thrombosed arteriovenous graft with end-stage  renal disease.   SURGEON:  Janetta Hora. Fields, MD   ANESTHESIA:  Local with intravenous sedation.   ASSISTANT:  Nurse.   INDICATIONS FOR PROCEDURE:  The patient is a 77 year old male with  thrombosed left upper arm arteriovenous graft who needs long term  hemodialysis access.   OPERATIVE FINDINGS:  1. Severe intimal hypoplastic narrowing of venous anastomosis.  2. End-to-side venous anastomosis converted to end-to-end anastomosis.   DESCRIPTION OF PROCEDURE:  After obtaining informed consent, the patient was  taken to the operating room.  The patient was placed in supine position on  the operating table.  Next, the patient's entire left upper extremity was  prepped and draped in the usual sterile fashion.  Local anesthesia was  infiltrated over a pre-existing axillary scar.  This incision was reopened  with sharp dissection and carried down to the level of the graft.  The graft  was dissected free circumferentially.  It was controlled with a vessel loop.  The dissection then proceeded down to the level of the venous anastomosis.  This had been previously done in an end-to-side fashion.  The vein was  severely narrowed in this area.  Therefore, the graft was transected and the  distal vein ligated.  The distal axillary vein venous  anastomosis was fairly  high in the axilla.  This was controlled distally with a Seraphim clamp.  The #4 Fogarty catheter was then passed up the venous limb multiple passes  until there was a good back flow of bleeding.  The area of intimal  hyperplastic tissue was resected.  The vein was then reoccluded with  Seraphim clamp.  The patient was given 5000 units of heparin.  Next, the  Fogarty catheter was passed down the arterial limb of the graft on multiple  passes until a clean pass was obtained.  There was good arterial inflow.  The patient was given 5000 units of heparin intravenously before passing the  catheter down the arterial limb.  Next, a 6 mm PTFE graft was brought up in  the operative field and spatulated and sewn end-to-end to the distal  axillary vein.  The graft was then cut to length and the clamp moved down  onto the graft below the  anastomosis.  A graft to graft end-to-end  anastomosis was then completed using running 6-0 Prolene suture.  Just prior  to completion of the anastomosis, everything was flushed and thoroughly back-  bled and fore-bled.  Anastomosis was secured.  There was noted to be a  palpable thrill within the graft immediately.  Next, the subcutaneous  tissues were reapproximated after obtaining hemostasis.  The skin was then  closed with a 4-0 Vicryl subcuticular stitch.  The patient tolerated the  procedure well.  There were no complications.  Sponge, needle and instrument  counts were correct at the end of the case.  The patient was taken to the  recovery room in stable condition.   The patient tolerated the procedure well.  There were no complications.  Sponge, needle and instrument counts were correct at the end of the case.  The patient was then transferred to the recovery room in stable condition.                                               Janetta Hora. Fields, MD    CEF/MEDQ  D:  01/04/2004  T:  01/04/2004  Job:  [id number]

## 2011-04-17 ENCOUNTER — Encounter: Payer: Self-pay | Admitting: Internal Medicine

## 2011-05-09 ENCOUNTER — Ambulatory Visit (HOSPITAL_COMMUNITY)
Admission: RE | Admit: 2011-05-09 | Discharge: 2011-05-09 | Disposition: A | Discharge status: Home / Self Care | Payer: Medicare Other | Source: Ambulatory Visit | Attending: Urology | Admitting: Urology

## 2011-05-09 ENCOUNTER — Other Ambulatory Visit: Payer: Self-pay | Admitting: Urology

## 2011-05-09 ENCOUNTER — Ambulatory Visit (HOSPITAL_BASED_OUTPATIENT_CLINIC_OR_DEPARTMENT_OTHER)
Admission: RE | Admit: 2011-05-09 | Discharge: 2011-05-09 | Disposition: A | Discharge status: Home / Self Care | Payer: Medicare Other | Source: Ambulatory Visit | Attending: Urology | Admitting: Urology

## 2011-05-09 DIAGNOSIS — Z01818 Encounter for other preprocedural examination: Secondary | ICD-10-CM | POA: Insufficient documentation

## 2011-05-09 DIAGNOSIS — Z992 Dependence on renal dialysis: Secondary | ICD-10-CM | POA: Insufficient documentation

## 2011-05-09 DIAGNOSIS — Z8551 Personal history of malignant neoplasm of bladder: Secondary | ICD-10-CM | POA: Insufficient documentation

## 2011-05-09 DIAGNOSIS — C679 Malignant neoplasm of bladder, unspecified: Secondary | ICD-10-CM | POA: Insufficient documentation

## 2011-05-09 DIAGNOSIS — Z09 Encounter for follow-up examination after completed treatment for conditions other than malignant neoplasm: Secondary | ICD-10-CM | POA: Insufficient documentation

## 2011-05-09 DIAGNOSIS — Z01810 Encounter for preprocedural cardiovascular examination: Secondary | ICD-10-CM | POA: Insufficient documentation

## 2011-05-09 DIAGNOSIS — Z87891 Personal history of nicotine dependence: Secondary | ICD-10-CM | POA: Insufficient documentation

## 2011-05-09 DIAGNOSIS — N186 End stage renal disease: Secondary | ICD-10-CM | POA: Insufficient documentation

## 2011-05-09 DIAGNOSIS — Z79899 Other long term (current) drug therapy: Secondary | ICD-10-CM | POA: Insufficient documentation

## 2011-05-09 DIAGNOSIS — Z01812 Encounter for preprocedural laboratory examination: Secondary | ICD-10-CM | POA: Insufficient documentation

## 2011-05-09 DIAGNOSIS — I10 Essential (primary) hypertension: Secondary | ICD-10-CM | POA: Insufficient documentation

## 2011-05-09 LAB — POCT I-STAT 4, (NA,K, GLUC, HGB,HCT)
HCT: 34 % — ABNORMAL LOW (ref 39.0–52.0)
Hemoglobin: 11.6 g/dL — ABNORMAL LOW (ref 13.0–17.0)

## 2011-05-10 NOTE — Op Note (Signed)
  NAME:  Andrew [name], Andrew [name] NO.:  [id number]  MEDICAL RECORD NO.:  [id number]  LOCATION:  XRAY                         FACILITY:  [facility]  PHYSICIAN:  Maretta Bees. Vonita Moss, M.D.DATE OF BIRTH:  1926/03/24  DATE OF PROCEDURE:  05/09/2011 DATE OF DISCHARGE:                              OPERATIVE REPORT   PREOPERATIVE DIAGNOSIS:  History of bladder carcinoma.  POSTOPERATIVE DIAGNOSIS:  History of bladder carcinoma.  PROCEDURE:  Cystoscopy and cold cup bladder biopsy with fulguration.  SURGEON:  Maretta Bees. Vonita Moss, M.D.  ANESTHESIA:  General.  INDICATIONS:  This gentleman is 77 years old and has chronic renal failure and is on dialysis.  He has very little voided urine and no history of recent bleeding.  He had a right nephroureterectomy for transitional cell carcinoma in 2000.  He has had bladder carcinomas in 2005, 2006  and 2009.  He underwent BCG therapy in the past and also BCG and Intron A.  He presents now for further surveillance cystoscopy.  DESCRIPTION OF PROCEDURE:  The patient was brought to the operating room, placed in lithotomy position.  External genitalia were prepped and draped in usual fashion.  He was cystoscoped and the anterior urethra was normal.  The prostate was short.  The bladder had some blanched areas from previous resection.  There were no papillary tumors.  I could not see the left ureteral orifice.  I did random biopsies on areas that had minimal increased vascularity.  The biopsy sites were then fulgurated with the Bugbee electrode.  The bladder was emptied, the scope was removed.  Xylocaine jelly was injected per urethra.  He had essentially no blood loss and there was excellent hemostasis at the completion of the case.     Maretta Bees. Vonita Moss, M.D.     LJP/MEDQ  D:  05/09/2011  T:  05/09/2011  Job:  [id number]  cc:   [facility]  Electronically Signed by Larey Dresser M.D. on 05/10/2011 08:49:42 AM

## 2011-06-13 ENCOUNTER — Other Ambulatory Visit: Payer: Self-pay | Admitting: Gastroenterology

## 2011-06-14 LAB — CBC WITH DIFFERENTIAL/PLATELET
Basophils Relative: 1 % (ref 0–1)
Hemoglobin: 12.2 g/dL — ABNORMAL LOW (ref 13.0–17.0)
Lymphs Abs: 1.8 10*3/uL (ref 0.7–4.0)
Monocytes Relative: 10 % (ref 3–12)
Neutro Abs: 2.1 10*3/uL (ref 1.7–7.7)
Neutrophils Relative %: 44 % (ref 43–77)
RBC: 3.92 MIL/uL — ABNORMAL LOW (ref 4.22–5.81)

## 2011-06-14 NOTE — Progress Notes (Signed)
Tried to called patient  but no answer.

## 2011-06-14 NOTE — Progress Notes (Signed)
Tied to call patient but no answer

## 2011-06-17 ENCOUNTER — Emergency Department (HOSPITAL_COMMUNITY)
Admission: EM | Admit: 2011-06-17 | Discharge: 2011-06-17 | Disposition: A | Discharge status: Home / Self Care | Payer: Medicare Other | Attending: Emergency Medicine | Admitting: Emergency Medicine

## 2011-06-17 ENCOUNTER — Encounter (HOSPITAL_COMMUNITY): Payer: Self-pay

## 2011-06-17 DIAGNOSIS — Z87891 Personal history of nicotine dependence: Secondary | ICD-10-CM | POA: Insufficient documentation

## 2011-06-17 DIAGNOSIS — N186 End stage renal disease: Secondary | ICD-10-CM | POA: Insufficient documentation

## 2011-06-17 DIAGNOSIS — T82898A Other specified complication of vascular prosthetic devices, implants and grafts, initial encounter: Secondary | ICD-10-CM | POA: Insufficient documentation

## 2011-06-17 DIAGNOSIS — Y832 Surgical operation with anastomosis, bypass or graft as the cause of abnormal reaction of the patient, or of later complication, without mention of misadventure at the time of the procedure: Secondary | ICD-10-CM | POA: Insufficient documentation

## 2011-06-17 DIAGNOSIS — D689 Coagulation defect, unspecified: Secondary | ICD-10-CM

## 2011-06-17 MED ORDER — DOUBLE ANTIBIOTIC 500-10000 UNIT/GM EX OINT: TOPICAL_OINTMENT | Freq: Once | CUTANEOUS | Status: DC

## 2011-06-17 MED ORDER — BACITRACIN ZINC 500 UNIT/GM EX OINT
TOPICAL_OINTMENT | CUTANEOUS | Status: AC
  Administered 2011-06-17: 1
  Filled 2011-06-17: qty 0.9

## 2011-06-17 NOTE — ED Notes (Signed)
Dialysis M/W/F,  States site to right upper arm started bleeding about an hour ago, appears to have stopped now with dressing in place by ems

## 2011-06-17 NOTE — ED Provider Notes (Signed)
History     Chief Complaint  Patient presents with  . Coagulation Disorder    dialysis graft bleeding   HPI Comments: Patient with ESRD on dialysis M-W-F who had dialysis yesterday and tonight his graft site began bleeding when he took the bandaid off. He was unable to get it stopped. EMS was called who placed a pressure dressing on the site with good results. Patient has been on dialysis for 9 years due to polycystic kidney disease.  The history is provided by the patient.    Past Medical History  Diagnosis Date  . Secondary hyperparathyroidism   . PVD (peripheral vascular disease)   . Hypertension   . Hyperlipidemia   . CAD (coronary artery disease)     UNSPECIFIED SITE  . Renal cell carcinoma     dialysis  . Duodenal ulcer     remote  . Gout   . Hemodialysis patient     Past Surgical History  Procedure Date  . Coronary artery bypass graft 1998  . Right nephrectomy 2001  . Colonoscopy 01/24/2011    prominent vascular pattern, suboptimal prep but doable  . Esophagogastroduodenoscopy 01/24/2011    mild erosive reflux esophagitis, bulbar/antral erosions, bx from antrum benign    Family History  Problem Relation Age of Onset  . Colon cancer Neg Hx   . Liver disease Neg Hx     History  Substance Use Topics  . Smoking status: Former Smoker    Quit date: 02/26/1984  . Smokeless tobacco: Not on file  . Alcohol Use: No      Review of Systems  All other systems reviewed and are negative.    Physical Exam  BP 127/62  Pulse 89  Temp(Src) 98.2 F (36.8 C) (Oral)  Resp 20  Ht 5\' 8"  (1.727 m)  Wt 182 lb (82.555 kg)  BMI 27.67 kg/m2  SpO2 96%  Physical Exam  Constitutional: He is oriented to person, place, and time. He appears well-developed and well-nourished.  HENT:  Head: Normocephalic and atraumatic.  Right Ear: External ear normal.  Mouth/Throat: Oropharynx is clear and moist.  Eyes: EOM are normal. Pupils are equal, round, and reactive to light.    Neck: Normal range of motion.  Cardiovascular: Normal rate.   Pulmonary/Chest: Effort normal and breath sounds normal.  Abdominal: Soft.  Musculoskeletal: Normal range of motion.  Neurological: He is alert and oriented to person, place, and time.  Skin: Skin is warm.       Right upper arm graft site with small area of bleeding at puncture site. With pressure bleeding stops. Graft with good bruit and thrill. 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    ED Course  Procedures  MDM       Nicoletta Dress. Colon Branch, MD 06/17/11 [phone]

## 2011-06-17 NOTE — ED Notes (Signed)
Patient had hemodialysis yesterday and had bleeding an hour ago to AV graft, pt states that they had given him something blood thinner on his dialysis, bleeding controlled at this time

## 2011-06-17 NOTE — ED Notes (Signed)
Dressing applied to the right arm. 4x4 dressing, bactrican & coband used. Bleeding controled.

## 2011-06-20 ENCOUNTER — Encounter: Payer: Self-pay | Admitting: Vascular Surgery

## 2011-06-21 ENCOUNTER — Other Ambulatory Visit: Payer: Self-pay | Admitting: Gastroenterology

## 2011-06-21 ENCOUNTER — Encounter: Payer: Self-pay | Admitting: Vascular Surgery

## 2011-06-21 ENCOUNTER — Encounter (INDEPENDENT_AMBULATORY_CARE_PROVIDER_SITE_OTHER): Payer: Medicare Other

## 2011-06-21 ENCOUNTER — Ambulatory Visit (INDEPENDENT_AMBULATORY_CARE_PROVIDER_SITE_OTHER): Payer: Medicare Other | Admitting: Vascular Surgery

## 2011-06-21 VITALS — BP 127/61 | HR 79 | Temp 97.7°F | Ht 68.0 in | Wt 180.0 lb

## 2011-06-21 DIAGNOSIS — T82898A Other specified complication of vascular prosthetic devices, implants and grafts, initial encounter: Secondary | ICD-10-CM

## 2011-06-21 DIAGNOSIS — N186 End stage renal disease: Secondary | ICD-10-CM

## 2011-06-21 DIAGNOSIS — Z01810 Encounter for preprocedural cardiovascular examination: Secondary | ICD-10-CM

## 2011-06-21 DIAGNOSIS — D649 Anemia, unspecified: Secondary | ICD-10-CM

## 2011-06-21 NOTE — Progress Notes (Signed)
F/u after episode of bleeding from Right UA AVF on 06-17-11. Pt went to ED.  Goes to HD Monday, Wednesday, and Fridays.

## 2011-06-21 NOTE — Progress Notes (Addendum)
Subjective:     Patient ID: Andrew [name], male   DOB: [birth year], 77 y.o.   MRN: [id number]  HPI This is an 77 year old gentleman who had a right upper arm AV fistula placed in December of 2007.He had revision of the arterial anastomosis in August of 2008, and then ligation of competing branches prior to this in April of 2008. The fistula has been working well for many years. He recently had an episode of bleeding related to his AV fistula and was evaluated in the emergency department at [facility]. The bleeding stopped and he was sent to our office for further evaluation. He has had no further bleeding problems.  He has had no recent uremic symptoms. Specifically he denies any nausea, vomiting, fatigue, anorexia, or significant shortness of breath.  He was previously evaluated in our office by Dr. Tawanna Cooler Early on 04/11/2011 because of the large aneurysms of his AV fistula. This patient had had multiple venous angioplasties of his stenosis of the cephalic vein near the junction with the subclavian vein. Dr. Tawanna Cooler Early had discussed revising the fistula by dividing the cephalic vein at the level of the stent and then mobilizing this for anastomosis to the axillary artery.  Review of Systems  Constitutional: Negative for fever, chills, appetite change and unexpected weight change.  HENT: Negative for trouble swallowing.   Respiratory: Positive for shortness of breath. Negative for cough, chest tightness and wheezing.   Cardiovascular: Negative for chest pain, palpitations and leg swelling.  Gastrointestinal: Negative for nausea, vomiting, diarrhea, constipation and blood in stool.  Genitourinary: Negative for dysuria, frequency, hematuria and difficulty urinating.  Musculoskeletal: Positive for arthralgias. Negative for myalgias.  Skin: Negative for rash and wound.  Neurological: Negative for dizziness, seizures, speech difficulty, weakness, numbness and headaches.  Hematological: Does not  bruise/bleed easily.  Psychiatric/Behavioral: Negative.        Objective:   Physical Exam  Constitutional: He is oriented to person, place, and time. He appears well-developed and well-nourished.  HENT:  Head: Normocephalic and atraumatic.  Neck: Neck supple. No JVD present. No thyromegaly present.  Cardiovascular: Normal rate, regular rhythm and normal heart sounds.  Exam reveals no friction rub.   No murmur heard. Pulmonary/Chest: Breath sounds normal. He has no wheezes. He has no rales.  Abdominal: Soft. Bowel sounds are normal. There is no tenderness.       No palpable aneurysm detected.  Musculoskeletal: Normal range of motion. He exhibits no edema.  Lymphadenopathy:    He has no cervical adenopathy.  Neurological: He is alert and oriented to person, place, and time. He has normal strength. No sensory deficit.  Skin: No lesion and no rash noted.  Psychiatric: He has a normal mood and affect.  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 He has one very large aneurysm above the antecubital space, and then a second large aneurysm more central to this. The fistula has a good thrill although it is somewhat pulsatile.     Assessment:     I did review his emergency department records. He was evaluated in the emergency department for bleeding from his AV fistula on 06/17/2011. The bleeding stopped with pressure. At the completion was a good bruit and thrill.  I did obtain a vein mapping in the office today of both upper extremities. He has had a previous forearm and upper arm graft in the left arm. I do not see any usable vein in the left arm for a  left arm AV fistula. I did independently interpret the vein mapping in his right upper extremity. He could potentially have a usable basilic vein in the right arm for a basilic vein transposition.  I have explained that if we're voiding to try to salvage his AV fistula in the right arm, we would need to try to revise the  cephalic vein distally as described by Dr. Arbie Cookey. 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 The aneurysm above the antecubital level is especially large and I would recommend plicating at at the time of his surgery. I have explained that there is the risk of his fistula occluding and having to proceed with new access if this extensive revision is not successful.  I have also explained an alternative option which would be to try to place a basilic vein transposition in the right or or a right AV graft and also placing a catheter and ligating his upper arm brachiocephalic fistula. After extensive discussion with the patient we have agreed that we will try to salvage his current upper arm fistula.    Plan:     He is scheduled for revision of his right upper arm AV fistula on 06/27/2011. In addition we will plicate the very large aneurysm in the proximal fistula. He dialyzes on Monday Wednesdays and Fridays so we will arrange to do this on a nondialysis day.

## 2011-06-21 NOTE — Progress Notes (Signed)
Addended by: Brantley Fling on: 06/21/2011 03:57 PM   Modules accepted: Kipp Brood

## 2011-06-22 ENCOUNTER — Encounter: Payer: Self-pay | Admitting: Adult Health

## 2011-06-22 ENCOUNTER — Ambulatory Visit (INDEPENDENT_AMBULATORY_CARE_PROVIDER_SITE_OTHER): Payer: Medicare Other | Admitting: Adult Health

## 2011-06-22 VITALS — BP 156/73 | HR 78 | Ht 68.0 in | Wt 184.0 lb

## 2011-06-22 DIAGNOSIS — I10 Essential (primary) hypertension: Secondary | ICD-10-CM

## 2011-06-27 ENCOUNTER — Encounter: Payer: Self-pay | Admitting: Adult Health

## 2011-06-28 NOTE — Procedures (Unsigned)
CEPHALIC VEIN MAPPING  INDICATION:  End-stage renal disease.  HISTORY: Hypertension, hyperlipidemia, CAD, CABG, and previous smoker.  EXAM:  The right cephalic vein is not evaluated.  The right basilic vein is compressible.  Diameter measurements range from 0.53 to 0.71 cm.  The left cephalic vein is not evaluated.  The left basilic vein is compressible.  Diameter measurements range from 0.19 to 0.28 cm.  See attached worksheet for all measurements.  IMPRESSION: 1. Patent bilateral basilic veins with diameter measurements as     described above. 2. Previous surgical intervention and placement of dialysis access     involving the bilateral upper arm cephalic veins is noted.  ___________________________________________ Di Kindle. Edilia Bo, M.D.  SH/MEDQ  D:  06/21/2011  T:  06/21/2011  Job:  [id number]

## 2011-06-29 ENCOUNTER — Ambulatory Visit: Payer: Medicare Other | Admitting: Adult Health

## 2011-06-29 ENCOUNTER — Ambulatory Visit (HOSPITAL_COMMUNITY)
Admission: RE | Admit: 2011-06-29 | Discharge: 2011-06-29 | Disposition: A | Discharge status: Home / Self Care | Payer: Medicare Other | Source: Ambulatory Visit | Attending: Vascular Surgery | Admitting: Vascular Surgery

## 2011-06-29 DIAGNOSIS — Z992 Dependence on renal dialysis: Secondary | ICD-10-CM | POA: Insufficient documentation

## 2011-06-29 DIAGNOSIS — Y849 Medical procedure, unspecified as the cause of abnormal reaction of the patient, or of later complication, without mention of misadventure at the time of the procedure: Secondary | ICD-10-CM | POA: Insufficient documentation

## 2011-06-29 DIAGNOSIS — Z951 Presence of aortocoronary bypass graft: Secondary | ICD-10-CM | POA: Insufficient documentation

## 2011-06-29 DIAGNOSIS — I509 Heart failure, unspecified: Secondary | ICD-10-CM | POA: Insufficient documentation

## 2011-06-29 DIAGNOSIS — T82898A Other specified complication of vascular prosthetic devices, implants and grafts, initial encounter: Secondary | ICD-10-CM | POA: Insufficient documentation

## 2011-06-29 DIAGNOSIS — I120 Hypertensive chronic kidney disease with stage 5 chronic kidney disease or end stage renal disease: Secondary | ICD-10-CM | POA: Insufficient documentation

## 2011-06-29 DIAGNOSIS — Z8673 Personal history of transient ischemic attack (TIA), and cerebral infarction without residual deficits: Secondary | ICD-10-CM | POA: Insufficient documentation

## 2011-06-29 DIAGNOSIS — I739 Peripheral vascular disease, unspecified: Secondary | ICD-10-CM | POA: Insufficient documentation

## 2011-06-29 DIAGNOSIS — N186 End stage renal disease: Secondary | ICD-10-CM

## 2011-06-29 LAB — SURGICAL PCR SCREEN: Staphylococcus aureus: NEGATIVE

## 2011-06-30 LAB — POCT I-STAT 4, (NA,K, GLUC, HGB,HCT)
Glucose, Bld: 83 mg/dL (ref 70–99)
HCT: 36 % — ABNORMAL LOW (ref 39.0–52.0)
Sodium: 136 mEq/L (ref 135–145)

## 2011-07-05 NOTE — Op Note (Signed)
NAME:  Andrew [name], Andrew [name] NO.:  [id number]  MEDICAL RECORD NO.:  [id number]  LOCATION:  [facility]                         FACILITY:  [facility]  PHYSICIAN:  Di Kindle. Edilia Bo, M.D.DATE OF BIRTH:  1925-12-12  DATE OF PROCEDURE:  06/29/2011 DATE OF DISCHARGE:  06/29/2011                              OPERATIVE REPORT   PREOPERATIVE DIAGNOSIS:  Poorly functioning right upper arm arteriovenous fistula.  POSTOPERATIVE DIAGNOSIS:  Poorly functioning right upper arm arteriovenous fistula.  PROCEDURE:  Revision of right brachiocephalic arteriovenous fistula (reimplantation of cephalic vein into axillary vein, excision of aneurysm of left arteriovenous fistula with bovine pericardial patch angioplasty of arteriovenous fistula).  SURGEON:  Di Kindle. Edilia Bo, MD  ASSISTANT:  Della Goo, PA-C  ANESTHESIA:  General.  INDICATIONS:  This is an 77 year old gentleman who had a right upper arm AV fistula placed in 2007.  He later had angioplasty of a stenosis in the cephalic brain just proximal to the entrance into the subclavian vein.  He developed recurrent stenosis here and apparently he had been having some problems with higher pressures and I had seen him in the office because of bleeding from 1 area.  Dr. Arbie Cookey had previously seen the patient in May and discussed possibly revising the venous outflow by reattaching the cephalic vein to the axillary vein.  This seemed to be a reasonable idea to lower the venous pressure.  I also felt it would necessary to excise a large aneurysm in the proximal fistula which was appeared to be potentially problematic.  TECHNIQUE:  The patient was taken to the operating room, received a general anesthetic.  The right upper extremity was prepped and draped in usual sterile fashion.  The two incisions were made over the cephalic vein at the level of the shoulder and the cephalic vein was dissected free over a length of approximately  12-15 cm.  I dissected all the way up to where the stent was in the distal cephalic vein.  Next elliptical incision was made over the very large aneurysm in the proximal upper arm and this was dissected free.  There was not enough redundant vein to excise the aneurysm and then sewn the fistula back end-to-end.  Next, an incision was made in the axilla and the high brachial vein was dissected free, was an excellent size vein.  The patient was then heparinized. The cephalic vein was ligated just proximal to the stent.  It was irrigated with heparinized saline and clamp.  It was then re-tunneled for anastomosis to the axillary vein.  The axillary vein was clamped proximally and distally.  A longitudinal venotomy was made.  The vein was mobilized over and sewn end-to-side to the axillary vein using continuous 6-0 Prolene suture.  Next, attention was turned to the large aneurysm which had been dissected free.  This was clamped proximally and distally and then the anterior aspect of the aneurysm was excised.  The vein was quite degenerative and had a large amount of laminated thrombus, and the vein excised, all the unhealthy-appearing vein and there was really no way to close this or plicate it.  I therefore elected to use a bovine pericardial patch.  This  was sewn using continuous 5-0 Prolene suture.  At the completion, there was a good thrill in the fistula, was somewhat pulsatile.  Hemostasis was obtained in the wounds.  The wounds were all closed with deep layer of 3-0 Vicryl and the skin closed with 4- 0 Vicryl.  Sterile dressing was applied.  The patient tolerated the procedure well and was transferred to the recovery room in stable condition.  All needle and sponge counts were correct.     Di Kindle. Edilia Bo, M.D.     CSD/MEDQ  D:  06/29/2011  T:  06/29/2011  Job:  [id number]  Electronically Signed by [name] M.D. on 07/05/2011 09:31:25 AM

## 2011-07-06 ENCOUNTER — Encounter: Payer: Self-pay | Admitting: Adult Health

## 2011-07-06 ENCOUNTER — Ambulatory Visit (INDEPENDENT_AMBULATORY_CARE_PROVIDER_SITE_OTHER): Payer: Medicare Other | Admitting: Adult Health

## 2011-07-06 DIAGNOSIS — E785 Hyperlipidemia, unspecified: Secondary | ICD-10-CM

## 2011-07-06 DIAGNOSIS — I25810 Atherosclerosis of coronary artery bypass graft(s) without angina pectoris: Secondary | ICD-10-CM

## 2011-07-06 DIAGNOSIS — I10 Essential (primary) hypertension: Secondary | ICD-10-CM

## 2011-07-06 NOTE — Progress Notes (Signed)
HPI:  Mr.Andrew [name] is a 77 y/o patient of Dr. Daleen Squibb with known history of CAD, s/p CABG in 1998, ESRD with dialysis MWF, hypertensin, gout, hyperlipidemia, renal cell carcinoma s/p right nephrectomy.  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 He is here for 6 month follow-up for cardiology evaluation.  He is without complaint and is doing well since above procedure. He denies any chest pain. He is experiencing soreness in the new  AV site post-operatively.  He has continued complaints of exertional dyspnea, but this usually resolves after dialysis. He is followed by Dr. Sherwood [name] for cholesterol status and Dr. Lowell Guitar for renal treatment.  No Known Allergies  Current Outpatient Prescriptions  Medication Sig Dispense Refill  . allopurinol (ZYLOPRIM) 100 MG tablet Take 100 mg by mouth 2 (two) times daily.       Marland Kitchen aspirin 81 MG tablet Take 81 mg by mouth daily.        Marland Kitchen atorvastatin (LIPITOR) 10 MG tablet Take 10 mg by mouth daily.        . B Complex-C-Zn-Folic Acid (DIALYVITE 800/ZINC PO) Take 1 tablet by mouth 1 dose over 46 hours.        . calcium carbonate (TUMS - DOSED IN MG ELEMENTAL CALCIUM) 500 MG chewable tablet Chew 1 tablet by mouth daily.        . cinacalcet (SENSIPAR) 30 MG tablet Take 30 mg by mouth daily.        . colchicine 0.6 MG tablet Take 0.6 mg by mouth daily. TAKE 2 TABS DAILY       . gabapentin (NEURONTIN) 100 MG tablet Take 100 mg by mouth daily.        . isosorbide mononitrate (IMDUR) 30 MG CR tablet Take 30 mg by mouth every morning.        Marland Kitchen omeprazole (PRILOSEC) 40 MG capsule Take 40 mg by mouth daily.        . polyethylene glycol powder (MIRALAX) powder Take 17 g by mouth daily.        . sevelamer (RENAGEL) 800 MG tablet Take 800 mg by mouth as directed.          Past Medical History    Diagnosis Date  . Secondary hyperparathyroidism   . PVD (peripheral vascular disease)   . Hypertension   . Hyperlipidemia   . CAD (coronary artery disease)     UNSPECIFIED SITE  . Renal cell carcinoma     dialysis  . Duodenal ulcer     remote  . Gout   . Hemodialysis patient     Hx of bleeding from aneurysms on AVF    Past Surgical History  Procedure Date  . Coronary artery bypass graft 1998  . Right nephrectomy 2001  . Colonoscopy 01/24/2011    prominent vascular pattern, suboptimal prep but doable  . Esophagogastroduodenoscopy 01/24/2011    mild erosive reflux esophagitis, bulbar/antral erosions, bx from antrum benign  . Av fistula repair 2008    revision of anastomosis of Right AVF  . Arteriovenous graft placement 2006    Thrombectomy and interposition jump graft revision to higher axillary vein of LUA AVG    ROS: Review of systems complete and found to be negative unless listed above PHYSICAL EXAM BP 112/50  Pulse 83  Ht 5\' 8"  (1.727 m)  Wt 184 lb (83.462  kg)  BMI 27.98 kg/m2  SpO2 94%  General: Well developed, well nourished, in no acute distress Head: Eyes PERRLA, No xanthomas.   Normal cephalic and atramatic  Lungs: Clear bilaterally to auscultation and percussion. Heart: HRRR , 2/6 holosystolic murmur.  Pulses are 2+ & equal.            No carotid bruit. No JVD.  No abdominal bruits. No femoral bruits. Abdomen: Bowel sounds are positive, abdomen soft and non-tender without masses or                  Hernia's noted. Msk:  Back normal, normal gait. Normal strength and tone for age. Right upper arm fistula placement incision is intact, and well healed. He has some ecchymosis, but no bleeding.  Good palpable thrill is noted. Extremities: No clubbing, cyanosis or edema. Diminished pulses bilateral LE. Neuro: Alert and oriented X 3. Psych:  Good affect, responds appropriately  ASSESSMENT AND PLAN

## 2011-07-06 NOTE — Assessment & Plan Note (Signed)
He is followed by Dr. Sherwood [name] for this with labs and medication adjustments as necessary.

## 2011-07-06 NOTE — Patient Instructions (Signed)
Your physician recommends that you schedule a follow-up appointment in: 6 months  

## 2011-07-06 NOTE — Assessment & Plan Note (Signed)
Well controlled at present. No changes in medications. 

## 2011-07-06 NOTE — Assessment & Plan Note (Signed)
At this time he appears stable from cardiac standpoint. Vital signs are stable. He denies chest pain. He admits to fatigue, usually associated with dialysis.  He denies any symptoms otherwise. Will continue to monitor him every 6 months but he is advised to be seen sooner should he become symptomatic.

## 2011-07-18 ENCOUNTER — Telehealth: Payer: Self-pay | Admitting: Adult Health

## 2011-07-18 ENCOUNTER — Other Ambulatory Visit: Payer: Self-pay

## 2011-07-18 MED ORDER — ISOSORBIDE MONONITRATE 30 MG PO TB24: 30.0000 mg | ORAL_TABLET | ORAL | Status: DC

## 2011-07-18 NOTE — Telephone Encounter (Signed)
PT NEEDS RX CALLED IN TO [facility] FOR ISOSORBIDE 30MG . HE LAST HAD FILLED BY DR Casimiro Needle. WAS SEEN LAST IN OUR OFFICE 07/06/11 BY KATHRYN.

## 2011-08-16 LAB — RENAL FUNCTION PANEL
Albumin: 3.2 — ABNORMAL LOW
Albumin: 3.3 — ABNORMAL LOW
BUN: 32 — ABNORMAL HIGH
Chloride: 103
GFR calc Af Amer: 4 — ABNORMAL LOW
GFR calc non Af Amer: 3 — ABNORMAL LOW
Phosphorus: 7 — ABNORMAL HIGH
Potassium: 4.4
Potassium: 5.6 — ABNORMAL HIGH
Sodium: 138

## 2011-08-16 LAB — BASIC METABOLIC PANEL
BUN: 76 — ABNORMAL HIGH
Calcium: 8.2 — ABNORMAL LOW
GFR calc non Af Amer: 3 — ABNORMAL LOW
Glucose, Bld: 88
Sodium: 140

## 2011-08-16 LAB — CBC
MCHC: 33.5
Platelets: 113 — ABNORMAL LOW
Platelets: 144 — ABNORMAL LOW
RBC: 3.2 — ABNORMAL LOW
RDW: 16 — ABNORMAL HIGH

## 2011-08-16 LAB — POCT I-STAT 4, (NA,K, GLUC, HGB,HCT)

## 2011-08-16 LAB — APTT: aPTT: 30

## 2011-08-16 LAB — PROTIME-INR
INR: 1.1
Prothrombin Time: 14.5

## 2011-08-18 LAB — I-STAT 8, (EC8 V) (CONVERTED LAB)
Acid-Base Excess: 2
BUN: 35 — ABNORMAL HIGH
Bicarbonate: 28.8 — ABNORMAL HIGH
HCT: 42
Hemoglobin: 14.3
Operator id: 268271
Sodium: 139
TCO2: 30

## 2011-09-01 NOTE — Progress Notes (Signed)
No additional notes needed  

## 2011-09-06 LAB — POCT I-STAT 4, (NA,K, GLUC, HGB,HCT)
HCT: 43
Hemoglobin: 14.6
Operator id: 114531

## 2011-09-08 LAB — POCT I-STAT 4, (NA,K, GLUC, HGB,HCT)
Operator id: 181601
Potassium: 4.7
Sodium: 135

## 2011-09-13 LAB — CBC
MCHC: 33.3
Platelets: 129 — ABNORMAL LOW
RDW: 15.4 — ABNORMAL HIGH

## 2011-09-13 LAB — BASIC METABOLIC PANEL
BUN: 79 — ABNORMAL HIGH
CO2: 17 — ABNORMAL LOW
Chloride: 105
Glucose, Bld: 86
Potassium: >7.5
Sodium: 135

## 2011-09-13 LAB — POCT I-STAT 4, (NA,K, GLUC, HGB,HCT)
Glucose, Bld: 147 — ABNORMAL HIGH
Hemoglobin: 14.3
Potassium: 5.4 — ABNORMAL HIGH
Sodium: 136

## 2011-09-13 LAB — RENAL FUNCTION PANEL
BUN: 42 — ABNORMAL HIGH
Chloride: 101
Creatinine, Ser: 10.8 — ABNORMAL HIGH
Glucose, Bld: 87
Potassium: 5.1

## 2011-09-13 LAB — POTASSIUM: Potassium: 4.8

## 2011-09-14 LAB — CBC WITH DIFFERENTIAL/PLATELET
Albumin: 4.2
Transferrin: 48

## 2011-09-14 LAB — COMPREHENSIVE METABOLIC PANEL
HCT: 35 %
Hemoglobin: 10.8 g/dL — AB (ref 13.5–17.5)
MCV: 98 fL

## 2011-09-17 ENCOUNTER — Emergency Department (HOSPITAL_COMMUNITY): Payer: Medicare Other

## 2011-09-17 ENCOUNTER — Inpatient Hospital Stay (HOSPITAL_COMMUNITY)
Admission: EM | Admit: 2011-09-17 | Discharge: 2011-09-19 | DRG: 291 | Disposition: A | Discharge status: Home Health | Payer: Medicare Other | Attending: Family Medicine | Admitting: Family Medicine

## 2011-09-17 ENCOUNTER — Encounter (HOSPITAL_COMMUNITY): Payer: Self-pay

## 2011-09-17 ENCOUNTER — Other Ambulatory Visit: Payer: Self-pay

## 2011-09-17 DIAGNOSIS — R0789 Other chest pain: Secondary | ICD-10-CM | POA: Diagnosis present

## 2011-09-17 DIAGNOSIS — N186 End stage renal disease: Secondary | ICD-10-CM | POA: Diagnosis present

## 2011-09-17 DIAGNOSIS — N2581 Secondary hyperparathyroidism of renal origin: Secondary | ICD-10-CM | POA: Diagnosis present

## 2011-09-17 DIAGNOSIS — IMO0001 Reserved for inherently not codable concepts without codable children: Principal | ICD-10-CM | POA: Diagnosis present

## 2011-09-17 DIAGNOSIS — Z951 Presence of aortocoronary bypass graft: Secondary | ICD-10-CM

## 2011-09-17 DIAGNOSIS — I2510 Atherosclerotic heart disease of native coronary artery without angina pectoris: Secondary | ICD-10-CM | POA: Diagnosis present

## 2011-09-17 DIAGNOSIS — I509 Heart failure, unspecified: Secondary | ICD-10-CM | POA: Diagnosis present

## 2011-09-17 DIAGNOSIS — Z992 Dependence on renal dialysis: Secondary | ICD-10-CM

## 2011-09-17 DIAGNOSIS — E875 Hyperkalemia: Secondary | ICD-10-CM | POA: Diagnosis present

## 2011-09-17 LAB — BASIC METABOLIC PANEL
BUN: 55 mg/dL — ABNORMAL HIGH (ref 6–23)
Calcium: 9.8 mg/dL (ref 8.4–10.5)
Creatinine, Ser: 9.86 mg/dL — ABNORMAL HIGH (ref 0.50–1.35)
GFR calc non Af Amer: 4 mL/min — ABNORMAL LOW (ref >90)
Glucose, Bld: 122 mg/dL — ABNORMAL HIGH (ref 70–99)
Potassium: 5.7 mEq/L — ABNORMAL HIGH (ref 3.5–5.1)

## 2011-09-17 LAB — CBC
Hemoglobin: 11.6 g/dL — ABNORMAL LOW (ref 13.0–17.0)
MCH: 32.2 pg (ref 26.0–34.0)
MCHC: 32.8 g/dL (ref 30.0–36.0)
MCV: 98.3 fL (ref 78.0–100.0)
RBC: 3.6 MIL/uL — ABNORMAL LOW (ref 4.22–5.81)

## 2011-09-17 LAB — POCT I-STAT, CHEM 8
Calcium, Ion: 1.18 mmol/L (ref 1.12–1.32)
Creatinine, Ser: 8.7 mg/dL — ABNORMAL HIGH (ref 0.50–1.35)
Glucose, Bld: 113 mg/dL — ABNORMAL HIGH (ref 70–99)
Hemoglobin: 12.2 g/dL — ABNORMAL LOW (ref 13.0–17.0)
Potassium: 5.8 mEq/L — ABNORMAL HIGH (ref 3.5–5.1)

## 2011-09-17 LAB — DIFFERENTIAL
Eosinophils Absolute: 0.3 10*3/uL (ref 0.0–0.7)
Eosinophils Relative: 6 % — ABNORMAL HIGH (ref 0–5)
Lymphs Abs: 0.9 10*3/uL (ref 0.7–4.0)
Monocytes Absolute: 0.2 10*3/uL (ref 0.1–1.0)
Monocytes Relative: 5 % (ref 3–12)

## 2011-09-17 MED ORDER — ASPIRIN EC 81 MG PO TBEC
81.0000 mg | DELAYED_RELEASE_TABLET | Freq: Every day | ORAL | Status: DC
  Administered 2011-09-18 – 2011-09-19 (×2): 81 mg via ORAL
  Filled 2011-09-17 (×2): qty 1

## 2011-09-17 MED ORDER — NITROGLYCERIN 0.4 MG SL SUBL: 0.4000 mg | SUBLINGUAL_TABLET | SUBLINGUAL | Status: DC | PRN

## 2011-09-17 MED ORDER — ONDANSETRON HCL 4 MG PO TABS: 4.0000 mg | ORAL_TABLET | Freq: Four times a day (QID) | ORAL | Status: DC | PRN

## 2011-09-17 MED ORDER — BIOTENE DRY MOUTH MT LIQD
Freq: Two times a day (BID) | OROMUCOSAL | Status: DC
  Administered 2011-09-17 – 2011-09-18 (×3): via OROMUCOSAL

## 2011-09-17 MED ORDER — SEVELAMER HCL 800 MG PO TABS
2400.0000 mg | ORAL_TABLET | Freq: Three times a day (TID) | ORAL | Status: DC
  Filled 2011-09-17 (×2): qty 3

## 2011-09-17 MED ORDER — DIALYVITE 800/ZINC 0.8 MG PO TABS: 1.0000 | ORAL_TABLET | Freq: Every day | ORAL | Status: DC

## 2011-09-17 MED ORDER — BISACODYL 10 MG RE SUPP: 10.0000 mg | RECTAL | Status: DC | PRN

## 2011-09-17 MED ORDER — NITROGLYCERIN IN D5W 200-5 MCG/ML-% IV SOLN
2.0000 ug/min | Freq: Once | INTRAVENOUS | Status: DC
  Filled 2011-09-17: qty 250

## 2011-09-17 MED ORDER — ISOSORBIDE MONONITRATE ER 30 MG PO TB24
30.0000 mg | ORAL_TABLET | ORAL | Status: DC
  Administered 2011-09-18: 30 mg via ORAL
  Filled 2011-09-17: qty 1

## 2011-09-17 MED ORDER — NITROGLYCERIN 0.4 MG SL SUBL
SUBLINGUAL_TABLET | SUBLINGUAL | Status: AC
  Filled 2011-09-17: qty 75

## 2011-09-17 MED ORDER — ACETAMINOPHEN 650 MG RE SUPP: 650.0000 mg | Freq: Four times a day (QID) | RECTAL | Status: DC | PRN

## 2011-09-17 MED ORDER — HEPARIN SODIUM (PORCINE) 1000 UNIT/ML DIALYSIS
1000.0000 [IU] | INTRAMUSCULAR | Status: DC | PRN
  Administered 2011-09-17 (×2): 1000 [IU] via INTRAVENOUS_CENTRAL
  Filled 2011-09-17: qty 1

## 2011-09-17 MED ORDER — CINACALCET HCL 30 MG PO TABS
30.0000 mg | ORAL_TABLET | Freq: Every day | ORAL | Status: DC
  Administered 2011-09-18 – 2011-09-19 (×2): 30 mg via ORAL
  Filled 2011-09-17 (×3): qty 1

## 2011-09-17 MED ORDER — ACETAMINOPHEN 325 MG PO TABS: 650.0000 mg | ORAL_TABLET | Freq: Four times a day (QID) | ORAL | Status: DC | PRN

## 2011-09-17 MED ORDER — HEPARIN SODIUM (PORCINE) 1000 UNIT/ML DIALYSIS
500.0000 [IU]/h | INTRAMUSCULAR | Status: DC | PRN
  Administered 2011-09-17 – 2011-09-18 (×3): 500 [IU]/h via INTRAVENOUS_CENTRAL
  Filled 2011-09-17: qty 1

## 2011-09-17 MED ORDER — NITROGLYCERIN 0.4 MG SL SUBL: 0.4000 mg | SUBLINGUAL_TABLET | Freq: Once | SUBLINGUAL | Status: DC

## 2011-09-17 MED ORDER — ONDANSETRON HCL 4 MG/2ML IJ SOLN: 4.0000 mg | Freq: Four times a day (QID) | INTRAMUSCULAR | Status: DC | PRN

## 2011-09-17 MED ORDER — POLYETHYLENE GLYCOL 3350 17 GM/SCOOP PO POWD
17.0000 g | Freq: Every day | ORAL | Status: DC
  Filled 2011-09-17: qty 255

## 2011-09-17 MED ORDER — SODIUM CHLORIDE 0.9 % IV SOLN
INTRAVENOUS | Status: AC
  Administered 2011-09-17: 21:00:00 via INTRAVENOUS

## 2011-09-17 MED ORDER — GABAPENTIN 100 MG PO CAPS
100.0000 mg | ORAL_CAPSULE | Freq: Every day | ORAL | Status: DC
  Administered 2011-09-18 – 2011-09-19 (×2): 100 mg via ORAL
  Filled 2011-09-17 (×3): qty 1

## 2011-09-17 MED ORDER — NEPHRO-VITE 0.8 MG PO TABS
1.0000 | ORAL_TABLET | Freq: Every day | ORAL | Status: DC
  Administered 2011-09-18 – 2011-09-19 (×2): 1 via ORAL
  Filled 2011-09-17 (×2): qty 1

## 2011-09-17 MED ORDER — COLCHICINE 0.6 MG PO TABS
0.6000 mg | ORAL_TABLET | Freq: Two times a day (BID) | ORAL | Status: DC
Start: 2011-09-17 — End: 2011-09-19
  Administered 2011-09-18 – 2011-09-19 (×4): 0.6 mg via ORAL
  Filled 2011-09-17 (×4): qty 1

## 2011-09-17 MED ORDER — SODIUM CHLORIDE 0.9 % IV SOLN: 100.0000 mL | INTRAVENOUS | Status: DC | PRN

## 2011-09-17 MED ORDER — PANTOPRAZOLE SODIUM 40 MG PO TBEC
80.0000 mg | DELAYED_RELEASE_TABLET | Freq: Every day | ORAL | Status: DC
  Administered 2011-09-18 – 2011-09-19 (×2): 80 mg via ORAL
  Filled 2011-09-17 (×2): qty 2

## 2011-09-17 MED ORDER — HEPARIN SODIUM (PORCINE) 1000 UNIT/ML DIALYSIS
1000.0000 [IU] | INTRAMUSCULAR | Status: DC | PRN
  Administered 2011-09-19: 1000 [IU] via INTRAVENOUS_CENTRAL
  Filled 2011-09-17: qty 1

## 2011-09-17 MED ORDER — ALLOPURINOL 100 MG PO TABS
100.0000 mg | ORAL_TABLET | Freq: Two times a day (BID) | ORAL | Status: DC
  Administered 2011-09-18 – 2011-09-19 (×4): 100 mg via ORAL
  Filled 2011-09-17 (×4): qty 1

## 2011-09-17 MED ORDER — CALCIUM CARBONATE ANTACID 500 MG PO CHEW
1.0000 | CHEWABLE_TABLET | Freq: Every day | ORAL | Status: DC
  Administered 2011-09-18 – 2011-09-19 (×2): 200 mg via ORAL
  Filled 2011-09-17 (×2): qty 1

## 2011-09-17 MED ORDER — SENNOSIDES-DOCUSATE SODIUM 8.6-50 MG PO TABS: 1.0000 | ORAL_TABLET | Freq: Every day | ORAL | Status: DC | PRN

## 2011-09-17 MED ORDER — SIMVASTATIN 20 MG PO TABS
20.0000 mg | ORAL_TABLET | Freq: Every day | ORAL | Status: DC
  Administered 2011-09-18 – 2011-09-19 (×3): 20 mg via ORAL
  Filled 2011-09-17 (×2): qty 1
  Filled 2011-09-17: qty 2

## 2011-09-17 MED ORDER — TRAZODONE HCL 50 MG PO TABS: 25.0000 mg | ORAL_TABLET | Freq: Every evening | ORAL | Status: DC | PRN

## 2011-09-17 MED ORDER — ENOXAPARIN SODIUM 30 MG/0.3ML ~~LOC~~ SOLN
30.0000 mg | SUBCUTANEOUS | Status: DC
  Administered 2011-09-18 – 2011-09-19 (×2): 30 mg via SUBCUTANEOUS
  Filled 2011-09-17 (×3): qty 0.3

## 2011-09-17 NOTE — ED Notes (Signed)
B/p 161/91  2nd NTG SL given

## 2011-09-17 NOTE — ED Notes (Signed)
NTG Drip infusing at 5 mcg---B/P 162/84  HR  99--having some brief runs of asymptomatic bigem.

## 2011-09-17 NOTE — ED Provider Notes (Signed)
History     CSN: [id number] Arrival date & time: 09/17/2011  5:24 PM   First MD Initiated Contact with Patient 09/17/11 1723      Chief Complaint  Patient presents with  . Shortness of Breath    (Consider location/radiation/quality/duration/timing/severity/associated sxs/prior treatment) Patient is a 77 y.o. male presenting with shortness of breath. The history is provided by the patient.  Shortness of Breath  The current episode started today. The problem has been rapidly worsening. The problem is severe. Associated symptoms include chest pain, cough and shortness of breath. Pertinent negatives include no fever and no stridor.   patient has had shortness of breath for the last day or 2. It is much worse today. He is a dialysis patient last had dialysis on Friday. He states he thinks they aren't taking enough fluid off. His dry weight is 88 kg. He states that he thinks he has lost weight though. Does have some chest pain. With a cough. He states that he feels as if he has extra fluid on. No fevers. Says he makes very little urine. No dominant pain. No nausea vomiting diarrhea.   Past Medical History  Diagnosis Date  . Secondary hyperparathyroidism   . PVD (peripheral vascular disease)   . Hypertension   . Hyperlipidemia   . CAD (coronary artery disease)     UNSPECIFIED SITE  . Renal cell carcinoma     dialysis  . Duodenal ulcer     remote  . Gout   . Hemodialysis patient     Hx of bleeding from aneurysms on AVF    Past Surgical History  Procedure Date  . Coronary artery bypass graft 1998  . Right nephrectomy 2001  . Colonoscopy 01/24/2011    prominent vascular pattern, suboptimal prep but doable  . Esophagogastroduodenoscopy 01/24/2011    mild erosive reflux esophagitis, bulbar/antral erosions, bx from antrum benign  . Av fistula repair 2008    revision of anastomosis of Right AVF  . Arteriovenous graft placement 2006    Thrombectomy and interposition jump graft revision  to higher axillary vein of LUA AVG    Family History  Problem Relation Age of Onset  . Colon cancer Neg Hx   . Liver disease Neg Hx   . Heart disease Father     History  Substance Use Topics  . Smoking status: Former Smoker -- 1.0 packs/day for 25 years    Types: Cigarettes    Quit date: 02/26/1984  . Smokeless tobacco: Never Used   Comment: quit 1985  . Alcohol Use: No      Review of Systems  Constitutional: Positive for fatigue. Negative for fever and activity change.  Respiratory: Positive for cough and shortness of breath. Negative for stridor.   Cardiovascular: Positive for chest pain.  Gastrointestinal: Negative for abdominal pain.  Genitourinary: Negative for hematuria.  Musculoskeletal: Negative for myalgias and back pain.  Skin: Negative for pallor.  Neurological: Negative for headaches.  Psychiatric/Behavioral: Negative for agitation.    Allergies  Review of patient's allergies indicates no known allergies.  Home Medications   Current Outpatient Rx  Name Route Sig Dispense Refill  . ALLOPURINOL 100 MG PO TABS Oral Take 100 mg by mouth 2 (two) times daily.     . ASPIRIN EC 81 MG PO TBEC Oral Take 81 mg by mouth daily.      . ATORVASTATIN CALCIUM 10 MG PO TABS Oral Take 10 mg by mouth daily.      Marland Kitchen DIALYVITE  800/ZINC PO Oral Take 1 tablet by mouth daily.     Marland Kitchen CALCIUM CARBONATE ANTACID 500 MG PO CHEW Oral Chew 1 tablet by mouth daily.      Marland Kitchen CINACALCET HCL 30 MG PO TABS Oral Take 30 mg by mouth daily.      . COLCHICINE 0.6 MG PO TABS Oral Take 0.6 mg by mouth 2 (two) times daily.     Marland Kitchen GABAPENTIN 100 MG PO TABS Oral Take 100 mg by mouth daily.      . ISOSORBIDE MONONITRATE 30 MG PO TB24 Oral Take 1 tablet (30 mg total) by mouth every morning. 30 tablet 6  . OMEPRAZOLE 40 MG PO CPDR Oral Take 40 mg by mouth daily.      Marland Kitchen POLYETHYLENE GLYCOL 3350 PO POWD Oral Take 17 g by mouth daily.      Marland Kitchen SEVELAMER HCL 800 MG PO TABS Oral Take 2,400 mg by mouth 3 (three)  times daily with meals. And patient takes 2 tablets with a snack    . ASPIRIN 81 MG PO TABS Oral Take 81 mg by mouth daily.        BP 192/92  Pulse 109  Temp(Src) 98 F (36.7 C) (Axillary)  Resp 26  Ht 5\' 8"  (1.727 m)  Wt 185 lb (83.915 kg)  BMI 28.13 kg/m2  SpO2 100%  Physical Exam  Nursing note and vitals reviewed. Constitutional: He is oriented to person, place, and time. He appears well-developed and well-nourished. He appears distressed.  HENT:  Head: Normocephalic and atraumatic.  Eyes: EOM are normal. Pupils are equal, round, and reactive to light.  Neck: Normal range of motion. Neck supple.  Cardiovascular: Regular rhythm and normal heart sounds.   No murmur heard.      Tachycardia  Pulmonary/Chest: He is in respiratory distress. He has rales (diffuse rales).  Abdominal: Soft. Bowel sounds are normal. He exhibits no distension and no mass. There is no tenderness. There is no rebound and no guarding.  Musculoskeletal: Normal range of motion. He exhibits edema.       Some peripheral edema  Neurological: He is alert and oriented to person, place, and time. No cranial nerve deficit.  Skin: Skin is warm and dry.  Psychiatric: He has a normal mood and affect.    ED Course  Procedures (including critical care time)  Labs Reviewed  CBC - Abnormal; Notable for the following:    RBC 3.60 (*)    Hemoglobin 11.6 (*)    HCT 35.4 (*)    Platelets 104 (*)    All other components within normal limits  DIFFERENTIAL - Abnormal; Notable for the following:    Eosinophils Relative 6 (*)    All other components within normal limits  BASIC METABOLIC PANEL - Abnormal; Notable for the following:    Potassium 5.7 (*)    Glucose, Bld 122 (*)    BUN 55 (*)    Creatinine, Ser 9.86 (*)    GFR calc non Af Amer 4 (*)    GFR calc Af Amer 5 (*)    All other components within normal limits  POCT I-STAT, CHEM 8 - Abnormal; Notable for the following:    Potassium 5.8 (*)    BUN 65 (*)     Creatinine, Ser 8.70 (*)    Glucose, Bld 113 (*)    Hemoglobin 12.2 (*)    HCT 36.0 (*)    All other components within normal limits  TROPONIN I  POCT  I-STAT TROPONIN I  I-STAT, CHEM 8   Dg Chest Portable 1 View  09/17/2011  *RADIOLOGY REPORT*  Clinical Data: Dyspnea  PORTABLE CHEST - 1 VIEW  Comparison: 05/09/2011 06/12/2010  Findings: Cardiomegaly and changes of median sternotomy for CABG. Right axillary vascular stent stable.  Diffuse pulmonary vascular congestion and mild diffuse interstitial prominence.  No focal airspace opacity or visible pleural effusion.  Dense atherosclerotic calcification of the transverse aortic arch.  Bony thorax is unremarkable.  IMPRESSION: Mild congestive heart failure pattern.  Original Report Authenticated By: Britta Mccreedy, M.D.     1. CHF (congestive heart failure)   2. Hyperkalemia      Date: 09/17/2011  Rate: 118  Rhythm: sinus tachycardia  QRS Axis: normal  Intervals: normal  ST/T Wave abnormalities: nonspecific ST changes  Conduction Disutrbances:none  Narrative Interpretation:   Old EKG Reviewed: nonspecific ST changes and tachycardia  CRITICAL CARE Performed by: Billee Cashing   Total critical care time: 30  Critical care time was exclusive of separately billable procedures and treating other patients.  Critical care was necessary to treat or prevent imminent or life-threatening deterioration.  Critical care was time spent personally by me on the following activities: development of treatment plan with patient and/or surrogate as well as nursing, discussions with consultants, evaluation of patient's response to treatment, examination of patient, obtaining history from patient or surrogate, ordering and performing treatments and interventions, ordering and review of laboratory studies, ordering and review of radiographic studies, pulse oximetry and re-evaluation of patient's condition.   MDM  patient came to the ED in moderate  respiratory distress. He is a dialysis patient, who was last dialyzed on Friday. He feels as if he is fluid overloaded. His x-ray shows some CHF. His physical exam shows fluid overload. I initially severe hypertension is improved with nitroglycerin drip. He initially required BiPAP, but that need resolved with the nitroglycerin drip. I has a mild hypokalemia with potassium of 5.8. He appears to need urgent dialysis. I discussed the case with Dr. Orvan Falconer the hospitalist. He will admit the patient the ICU. I discussed the case with Dr. Kristian Covey, who will arrange for dialysis.   Juliet Rude. Rubin Payor, MD 09/17/11 2005

## 2011-09-17 NOTE — ED Notes (Addendum)
Pt brought in by EMS for increased SOB since today. Pt had dialysis Friday. Pt placed on CPAP by EMS. 20G IV to R FA.

## 2011-09-17 NOTE — Progress Notes (Signed)
Attempted to dialise pt in ICU12  But could not attache water to machine.  Call to Dr Orvan Falconer to ask if he could go to hemo room since all rooms in ICU was full.  Dr Orvan Falconer oked to take to hemo with moniter.  Will call center tomorrow about care plan and hep b status.  Consent signed and daughter present to discuss hd tx.

## 2011-09-17 NOTE — ED Notes (Signed)
Repeat EKG in progress---continues to have runs of bigem.

## 2011-09-17 NOTE — ED Notes (Signed)
B/P 146/82--NTG drip at 5 mcg--continues with bigem--Dr. Pinkering notified--pt. Continues to have improvement in respiratory status and denies any chest pain at this time--daughter now at bedside and states he advised her earlier that "I feel like I am having a heart attack."

## 2011-09-17 NOTE — Consult Note (Signed)
Reason for Consult: CHF and end-stage renal disease Referring Physician: As hospitalist group  Andrew [name] is an 77 y.o. male.  HPI: Is a patient with history of  coronary artery disease status post CABG, history of reccurent  CHF and end-stage renal disease on maintenance hemodialysis presently came with complaints of difficulty in breathing  since yesterday. According to the patient it started with exertional dyspnea and then  started having difficulty in breathing when he is lying down. He doesn't have any nausea or vomiting. Patient has been admitted with similar history from before. According to him his dry weight was increased and  because of that they were  not pulling  much fluid during dialysis. At this moment he doesn't have any fever chills or sweating. Patient also says that he was having some  chest pain,which  Was on the right side without any radiation. Presently patient  has improved  And no chest pain.  Past Medical History  Diagnosis Date  . Secondary hyperparathyroidism   . PVD (peripheral vascular disease)   . Hypertension   . Hyperlipidemia   . CAD (coronary artery disease)     UNSPECIFIED SITE  . Renal cell carcinoma     dialysis  . Duodenal ulcer     remote  . Gout   . Hemodialysis patient     Hx of bleeding from aneurysms on AVF    Past Surgical History  Procedure Date  . Coronary artery bypass graft 1998  . Right nephrectomy 2001  . Colonoscopy 01/24/2011    prominent vascular pattern, suboptimal prep but doable  . Esophagogastroduodenoscopy 01/24/2011    mild erosive reflux esophagitis, bulbar/antral erosions, bx from antrum benign  . Av fistula repair 2008    revision of anastomosis of Right AVF  . Arteriovenous graft placement 2006    Thrombectomy and interposition jump graft revision to higher axillary vein of LUA AVG    Family History  Problem Relation Age of Onset  . Colon cancer Neg Hx   . Liver disease Neg Hx   . Heart disease Father      Social History:  reports that he quit smoking about 27 years ago. His smoking use included Cigarettes. He has a 25 pack-year smoking history. He has never used smokeless tobacco. He reports that he does not drink alcohol or use illicit drugs.  Allergies: No Known Allergies  Medications: I have reviewed the patient's current medications.  Results for orders placed during the hospital encounter of 09/17/11 (from the past 48 hour(s))  CBC     Status: Abnormal   Collection Time   09/17/11  6:10 PM      Component Value Range Comment   WBC 4.7  4.0 - 10.5 (K/uL)    RBC 3.60 (*) 4.22 - 5.81 (MIL/uL)    Hemoglobin 11.6 (*) 13.0 - 17.0 (g/dL)    HCT 40.9 (*) 81.1 - 52.0 (%)    MCV 98.3  78.0 - 100.0 (fL)    MCH 32.2  26.0 - 34.0 (pg)    MCHC 32.8  30.0 - 36.0 (g/dL)    RDW 91.4  78.2 - 95.6 (%)    Platelets 104 (*) 150 - 400 (K/uL)   DIFFERENTIAL     Status: Abnormal   Collection Time   09/17/11  6:10 PM      Component Value Range Comment   Neutrophils Relative 68  43 - 77 (%)    Neutro Abs 3.2  1.7 - 7.7 (K/uL)  Lymphocytes Relative 20  12 - 46 (%)    Lymphs Abs 0.9  0.7 - 4.0 (K/uL)    Monocytes Relative 5  3 - 12 (%)    Monocytes Absolute 0.2  0.1 - 1.0 (K/uL)    Eosinophils Relative 6 (*) 0 - 5 (%)    Eosinophils Absolute 0.3  0.0 - 0.7 (K/uL)    Basophils Relative 1  0 - 1 (%)    Basophils Absolute 0.0  0.0 - 0.1 (K/uL)   BASIC METABOLIC PANEL     Status: Abnormal   Collection Time   09/17/11  6:10 PM      Component Value Range Comment   Sodium 135  135 - 145 (mEq/L)    Potassium 5.7 (*) 3.5 - 5.1 (mEq/L)    Chloride 96  96 - 112 (mEq/L)    CO2 29  19 - 32 (mEq/L)    Glucose, Bld 122 (*) 70 - 99 (mg/dL)    BUN 55 (*) 6 - 23 (mg/dL)    Creatinine, Ser 1.61 (*) 0.50 - 1.35 (mg/dL)    Calcium 9.8  8.4 - 10.5 (mg/dL)    GFR calc non Af Amer 4 (*) >90 (mL/min)    GFR calc Af Amer 5 (*) >90 (mL/min)   TROPONIN I     Status: Normal   Collection Time   09/17/11  6:11 PM       Component Value Range Comment   Troponin I <0.30  <0.30 (ng/mL)   POCT I-STAT, CHEM 8     Status: Abnormal   Collection Time   09/17/11  6:35 PM      Component Value Range Comment   Sodium 138  135 - 145 (mEq/L)    Potassium 5.8 (*) 3.5 - 5.1 (mEq/L)    Chloride 104  96 - 112 (mEq/L)    BUN 65 (*) 6 - 23 (mg/dL)    Creatinine, Ser 0.96 (*) 0.50 - 1.35 (mg/dL)    Glucose, Bld 045 (*) 70 - 99 (mg/dL)    Calcium, Ion 4.09  1.12 - 1.32 (mmol/L)    TCO2 28  0 - 100 (mmol/L)    Hemoglobin 12.2 (*) 13.0 - 17.0 (g/dL)    HCT 81.1 (*) 91.4 - 52.0 (%)   POCT I-STAT TROPONIN I     Status: Normal   Collection Time   09/17/11  6:40 PM      Component Value Range Comment   Troponin i, poc 0.06  0.00 - 0.08 (ng/mL)    Comment 3              Dg Chest Portable 1 View  09/17/2011  *RADIOLOGY REPORT*  Clinical Data: Dyspnea  PORTABLE CHEST - 1 VIEW  Comparison: 05/09/2011 06/12/2010  Findings: Cardiomegaly and changes of median sternotomy for CABG. Right axillary vascular stent stable.  Diffuse pulmonary vascular congestion and mild diffuse interstitial prominence.  No focal airspace opacity or visible pleural effusion.  Dense atherosclerotic calcification of the transverse aortic arch.  Bony thorax is unremarkable.  IMPRESSION: Mild congestive heart failure pattern.  Original Report Authenticated By: Britta Mccreedy, M.D.    Review of Systems  Constitutional: Positive for weight loss. Negative for fever and chills.  Respiratory: Positive for shortness of breath.   Cardiovascular: Positive for chest pain, orthopnea and PND.  Gastrointestinal: Negative for nausea and vomiting.   Blood pressure 189/83, pulse 90, temperature 98 F (36.7 C), temperature source Axillary, resp. rate 26, height 5\' 8"  (1.727  m), weight 83.915 kg (185 lb), SpO2 100.00%. Physical Exam  Constitutional: He is oriented to person, place, and time.  Eyes: No scleral icterus.  Cardiovascular: Normal rate, regular rhythm and  intact distal pulses.  Exam reveals gallop.   No murmur heard. Respiratory: Effort normal. He has no wheezes. He has rales.       He has decreased breath sound bilaterally. He doesn't have any egophony no bronchial sounds.  Musculoskeletal: He exhibits edema.  Neurological: He is alert and oriented to person, place, and time.    Assessment/Plan: Problem #1 CHF possibly from noncompliance was fluid and salt intake. Presently is on mother oxygen seems to be feeling better. Chest x-ray is consistent with his fluid overload. Problem #2 end-stage renal disease is status post hemodialysis on Friday pending creatinine was in acceptable range slightly elevated potassium. Problem #3 history of hypertension possibly secondary to salt and fluid intake. According to the patient since his blood pressure usually goes down he was taken off all antihypertensive medications. Problem #4 coronary artery disease patient has symptomatic. He has an episode of chest pain but seems to be noncardiac. Problem #5 history of anemia is secondary to chronic renal failure Problem #6 secondary hyperparathyroidism Problem #7 history of GERD Problem #8 history of hyperlipidemia. Recommendation I will make arrangements for patient to get dialysis Will dialyze him for 4 hours and try to get about 3 L. His blood pressure started going down we'll discontinue the nitro drip. We'll check his CBC and basic metabolic panel and phosphorus in the morning.  Jamaurion Slemmer S 09/17/2011, 8:21 PM

## 2011-09-17 NOTE — ED Notes (Signed)
B/P 192/92  HR  106  Pulse ox 100% with BI-PAP at 12/6, 45%   NTG 1/150 gr SL given

## 2011-09-17 NOTE — ED Notes (Signed)
B/P 130/80  HR  96 SR at this time--Continues to have vast improvement in respiratory status with BI-PAP at current settings.

## 2011-09-18 ENCOUNTER — Inpatient Hospital Stay (HOSPITAL_COMMUNITY): Payer: Medicare Other

## 2011-09-18 ENCOUNTER — Other Ambulatory Visit: Payer: Self-pay

## 2011-09-18 LAB — BASIC METABOLIC PANEL
BUN: 30 mg/dL — ABNORMAL HIGH (ref 6–23)
CO2: 30 mEq/L (ref 19–32)
Chloride: 95 mEq/L — ABNORMAL LOW (ref 96–112)
Glucose, Bld: 78 mg/dL (ref 70–99)
Potassium: 4.5 mEq/L (ref 3.5–5.1)
Sodium: 136 mEq/L (ref 135–145)

## 2011-09-18 LAB — CARDIAC PANEL(CRET KIN+CKTOT+MB+TROPI)
CK, MB: 6.6 ng/mL (ref 0.3–4.0)
CK, MB: 4.8 ng/mL — ABNORMAL HIGH (ref 0.3–4.0)
Relative Index: INVALID (ref 0.0–2.5)
Total CK: 98 U/L (ref 7–232)
Troponin I: <0.3 ng/mL (ref <0.30)
Troponin I: <0.3 ng/mL (ref <0.30)

## 2011-09-18 LAB — PHOSPHORUS: Phosphorus: 3.6 mg/dL (ref 2.3–4.6)

## 2011-09-18 MED ORDER — ISOSORBIDE MONONITRATE 15 MG HALF TABLET
15.0000 mg | ORAL_TABLET | Freq: Every day | ORAL | Status: AC
  Administered 2011-09-18: 11:00:00 via ORAL
  Filled 2011-09-18: qty 1

## 2011-09-18 MED ORDER — ISOSORBIDE MONONITRATE ER 30 MG PO TB24
45.0000 mg | ORAL_TABLET | ORAL | Status: DC
  Filled 2011-09-18 (×2): qty 1

## 2011-09-18 MED ORDER — SODIUM CHLORIDE 0.9 % IV SOLN: 100.0000 mL | INTRAVENOUS | Status: DC | PRN

## 2011-09-18 MED ORDER — ISOSORBIDE MONONITRATE ER 30 MG PO TB24
45.0000 mg | ORAL_TABLET | ORAL | Status: DC
  Administered 2011-09-19: 45 mg via ORAL
  Filled 2011-09-18: qty 2

## 2011-09-18 MED ORDER — SEVELAMER CARBONATE 800 MG PO TABS
2400.0000 mg | ORAL_TABLET | Freq: Three times a day (TID) | ORAL | Status: DC
  Administered 2011-09-18 – 2011-09-19 (×3): 2400 mg via ORAL
  Filled 2011-09-18 (×3): qty 3

## 2011-09-18 NOTE — H&P (Signed)
PCP:   [name]., MD   Chief Complaint:  SOB x tody  HPI: Andrew [name] is an 77 y.o. male.  Monitor Wednesday Friday dialysis; progressive shortnes of breath and chest pain since last on Friday; 5/10  relieved by rest; does not feel he was fully dialyzed.  patient has to get BIPAP initaly in ED; improved with NTG gtt.  Past Medical History  Diagnosis Date  . Secondary hyperparathyroidism   . PVD (peripheral vascular disease)   . Hypertension   . Hyperlipidemia   . CAD (coronary artery disease)     UNSPECIFIED SITE  . Renal cell carcinoma     dialysis  . Duodenal ulcer     remote  . Gout   . Hemodialysis patient     Hx of bleeding from aneurysms on AVF  . CHF (congestive heart failure) 09/17/2011    Past Surgical History  Procedure Date  . Coronary artery bypass graft 1998  . Right nephrectomy 2001  . Colonoscopy 01/24/2011    prominent vascular pattern, suboptimal prep but doable  . Esophagogastroduodenoscopy 01/24/2011    mild erosive reflux esophagitis, bulbar/antral erosions, bx from antrum benign  . Av fistula repair 2008    revision of anastomosis of Right AVF  . Arteriovenous graft placement 2006    Thrombectomy and interposition jump graft revision to higher axillary vein of LUA AVG    Medications:  HOME MEDS: Prior to Admission medications   Medication Sig Start Date End Date Taking? Authorizing Provider  allopurinol (ZYLOPRIM) 100 MG tablet Take 100 mg by mouth 2 (two) times daily.    Yes Historical Provider, MD  aspirin EC 81 MG tablet Take 81 mg by mouth daily.     Yes Historical Provider, MD  atorvastatin (LIPITOR) 10 MG tablet Take 10 mg by mouth daily.     Yes Historical Provider, MD  B Complex-C-Zn-Folic Acid (DIALYVITE 800/ZINC PO) Take 1 tablet by mouth daily.    Yes Historical Provider, MD  calcium carbonate (TUMS - DOSED IN MG ELEMENTAL CALCIUM) 500 MG chewable tablet Chew 1 tablet by mouth daily.     Yes Historical Provider, MD    cinacalcet (SENSIPAR) 30 MG tablet Take 30 mg by mouth daily.     Yes Historical Provider, MD  colchicine 0.6 MG tablet Take 0.6 mg by mouth 2 (two) times daily.    Yes Historical Provider, MD  gabapentin (NEURONTIN) 100 MG tablet Take 100 mg by mouth daily.     Yes Historical Provider, MD  isosorbide mononitrate (IMDUR) 30 MG CR tablet Take 1 tablet (30 mg total) by mouth every morning. 07/18/11  Yes Joni Reining, NP  omeprazole (PRILOSEC) 40 MG capsule Take 40 mg by mouth daily.     Yes Historical Provider, MD  polyethylene glycol powder (MIRALAX) powder Take 17 g by mouth daily.     Yes Historical Provider, MD  sevelamer (RENAGEL) 800 MG tablet Take 2,400 mg by mouth 3 (three) times daily with meals. And patient takes 2 tablets with a snack   Yes Historical Provider, MD  aspirin 81 MG tablet Take 81 mg by mouth daily.      Historical Provider, MD    PRIOR TO [facility]   Allergies:  No Known Allergies  Social History:   reports that he quit smoking about 27 years ago. His smoking use included Cigarettes. He has a 25 pack-year smoking history. He has never used smokeless tobacco. He reports that he does not drink  alcohol or use illicit drugs.  Family History: Family History  Problem Relation Age of Onset  . Colon cancer Neg Hx   . Liver disease Neg Hx   . Heart disease Father     Rewiew of Systems:  The patient denies anorexia, fever, weight loss,, vision loss, decreased hearing, hoarseness, chest pain, syncope,, peripheral edema, balance deficits, hemoptysis, abdominal pain, melena, hematochezia, severe indigestion/heartburn, hematuria, incontinence, genital sores, muscle weakness, suspicious skin lesions, transient blindness, difficulty walking, depression, unusual weight change, abnormal bleeding, enlarged lymph nodes, angioedema, and breast masses.  Physical Exam: Filed Vitals:   09/18/11 0053 09/18/11 0100 09/18/11 0115 09/18/11 0130  BP: 112/69 130/67 146/89 138/66   Pulse: 85 89 89 87  Temp:    98.2 F (36.8 C)  TempSrc:    Oral  Resp:    18  Height:      Weight:    80.3 kg (177 lb 0.5 oz)  SpO2:    98%   Blood pressure 138/66, pulse 87, temperature 98.2 F (36.8 C), temperature source Oral, resp. rate 18, height 5\' 8"  (1.727 m), weight 80.3 kg (177 lb 0.5 oz), SpO2 98.00%.  GEN: Elderly white man lying in the stretcher in respiratory distress; cooperative with exam PSYCH: He is alert and oriented x4; does appear anxious, does not appear depressed; affect is normal HEENT: Mucous membranes pink and anicteric; PERRLA; EOM intact; no cervical lymphadenopathy nor thyromegaly or carotid bruit; Breasts:: Not examined CHEST WALL: No tenderness CHEST: rapidl respiration, bilateral rhonchi HEART: Regular rate and rhythm; 3/6 systolic ps BACK: ; no CVA tenderness ABDOMEN: Obese, soft non-tender; no masses, no organomegaly, normal abdominal bowel sounds; no pannus; no intertriginous candida. Rectal Exam: Not done EXTREMITIES: ; age-appropriate arthropathy of the hands and knees; trace edema; no ulcerations. Right arm fistula Genitalia: not examined PULSES: 2+ and symmetric SKIN: Normal hydration no rash or ulceration CNS: Cranial nerves 2-12 grossly intact no focal neurologic deficit   Labs & Imaging Results for orders placed during the hospital encounter of 09/17/11 (from the past 48 hour(s))  CBC     Status: Abnormal   Collection Time   09/17/11  6:10 PM      Component Value Range Comment   WBC 4.7  4.0 - 10.5 (K/uL)    RBC 3.60 (*) 4.22 - 5.81 (MIL/uL)    Hemoglobin 11.6 (*) 13.0 - 17.0 (g/dL)    HCT 98.1 (*) 19.1 - 52.0 (%)    MCV 98.3  78.0 - 100.0 (fL)    MCH 32.2  26.0 - 34.0 (pg)    MCHC 32.8  30.0 - 36.0 (g/dL)    RDW 47.8  29.5 - 62.1 (%)    Platelets 104 (*) 150 - 400 (K/uL)   DIFFERENTIAL     Status: Abnormal   Collection Time   09/17/11  6:10 PM      Component Value Range Comment   Neutrophils Relative 68  43 - 77 (%)     Neutro Abs 3.2  1.7 - 7.7 (K/uL)    Lymphocytes Relative 20  12 - 46 (%)    Lymphs Abs 0.9  0.7 - 4.0 (K/uL)    Monocytes Relative 5  3 - 12 (%)    Monocytes Absolute 0.2  0.1 - 1.0 (K/uL)    Eosinophils Relative 6 (*) 0 - 5 (%)    Eosinophils Absolute 0.3  0.0 - 0.7 (K/uL)    Basophils Relative 1  0 - 1 (%)  Basophils Absolute 0.0  0.0 - 0.1 (K/uL)   BASIC METABOLIC PANEL     Status: Abnormal   Collection Time   09/17/11  6:10 PM      Component Value Range Comment   Sodium 135  135 - 145 (mEq/L)    Potassium 5.7 (*) 3.5 - 5.1 (mEq/L)    Chloride 96  96 - 112 (mEq/L)    CO2 29  19 - 32 (mEq/L)    Glucose, Bld 122 (*) 70 - 99 (mg/dL)    BUN 55 (*) 6 - 23 (mg/dL)    Creatinine, Ser 1.61 (*) 0.50 - 1.35 (mg/dL)    Calcium 9.8  8.4 - 10.5 (mg/dL)    GFR calc non Af Amer 4 (*) >90 (mL/min)    GFR calc Af Amer 5 (*) >90 (mL/min)   TROPONIN I     Status: Normal   Collection Time   09/17/11  6:11 PM      Component Value Range Comment   Troponin I <0.30  <0.30 (ng/mL)   POCT I-STAT, CHEM 8     Status: Abnormal   Collection Time   09/17/11  6:35 PM      Component Value Range Comment   Sodium 138  135 - 145 (mEq/L)    Potassium 5.8 (*) 3.5 - 5.1 (mEq/L)    Chloride 104  96 - 112 (mEq/L)    BUN 65 (*) 6 - 23 (mg/dL)    Creatinine, Ser 0.96 (*) 0.50 - 1.35 (mg/dL)    Glucose, Bld 045 (*) 70 - 99 (mg/dL)    Calcium, Ion 4.09  1.12 - 1.32 (mmol/L)    TCO2 28  0 - 100 (mmol/L)    Hemoglobin 12.2 (*) 13.0 - 17.0 (g/dL)    HCT 81.1 (*) 91.4 - 52.0 (%)   POCT I-STAT TROPONIN I     Status: Normal   Collection Time   09/17/11  6:40 PM      Component Value Range Comment   Troponin i, poc 0.06  0.00 - 0.08 (ng/mL)    Comment 3            MRSA PCR SCREENING     Status: Normal   Collection Time   09/17/11  8:58 PM      Component Value Range Comment   MRSA by PCR NEGATIVE  NEGATIVE    CARDIAC PANEL(CRET KIN+CKTOT+MB+TROPI)     Status: Abnormal   Collection Time   09/17/11 11:13 PM       Component Value Range Comment   Total CK 98  7 - 232 (U/L)    CK, MB 6.6 (*) 0.3 - 4.0 (ng/mL)    Troponin I <0.30  <0.30 (ng/mL)    Relative Index RELATIVE INDEX IS INVALID  0.0 - 2.5     Dg Chest Portable 1 View  09/17/2011  *RADIOLOGY REPORT*  Clinical Data: Dyspnea  PORTABLE CHEST - 1 VIEW  Comparison: 05/09/2011 06/12/2010  Findings: Cardiomegaly and changes of median sternotomy for CABG. Right axillary vascular stent stable.  Diffuse pulmonary vascular congestion and mild diffuse interstitial prominence.  No focal airspace opacity or visible pleural effusion.  Dense atherosclerotic calcification of the transverse aortic arch.  Bony thorax is unremarkable.  IMPRESSION: Mild congestive heart failure pattern.  Original Report Authenticated By: Britta Mccreedy, M.D.      Assessment Present on Admission:  .ESRD (end stage renal disease) on dialysis .Hyperkalemia .Fluid overload .Chest pain on exertion .HTN (hypertension) .CAD (coronary artery  disease)   PLAN: This may be simple fluid overload but could also be ACS with acute heart failure. Agree with emergent dialysis but will cycyle cardiac enzymes, continue nitrates and beta blockers, and cnosider cardiology consult. Reassess throught the night.  Other plans as per orders.  Critical care time: 60 minutes.   Yosgar Demirjian 09/18/2011, 1:42 AM

## 2011-09-18 NOTE — Progress Notes (Addendum)
Subjective:  Well, no further SOB, no cp.n.v.blurred vision, double vision. States Had more SOB yesterday but after 4 hrs of Diualysis feels better Ate full breakfast  Objective: Blood pressure 142/73, pulse 73, temperature 97.8 F (36.6 C), temperature source Oral, resp. rate 14, height 5\' 8"  (1.727 m), weight 81.7 kg (180 lb 1.9 oz), SpO2 100.00%. Weight change:   Intake/Output Summary (Last 24 hours) at 09/18/11 0813 Last data filed at 09/18/11 0600  Gross per 24 hour  Intake 188.61 ml  Output   2866 ml  Net -2677.39 ml    EOMI, looks stated age, no pallor/no ict CTA  S1 S2 no M/R/G Abd soft nt,nt Neuro IO [place] looks clean, good bruit  Lab Results: CBC    Component Value Date/Time   WBC 4.7 09/17/2011 1810   RBC 3.60* 09/17/2011 1810   HGB 12.2* 09/17/2011 1835   HCT 36.0* 09/17/2011 1835   HCT 36 03/23/2011 0909   PLT 104* 09/17/2011 1810   MCV 98.3 09/17/2011 1810   MCV 102 03/23/2011 0909   MCV 102.0 03/21/2011 0826   MCH 32.2 09/17/2011 1810   MCHC 32.8 09/17/2011 1810   RDW 14.8 09/17/2011 1810   LYMPHSABS 0.9 09/17/2011 1810   MONOABS 0.2 09/17/2011 1810   EOSABS 0.3 09/17/2011 1810   BASOSABS 0.0 09/17/2011 1810   BMET    Component Value Date/Time   NA 136 09/18/2011 0438   K 4.5 09/18/2011 0438   K 6.0 03/23/2011 0912   CL 95* 09/18/2011 0438   CO2 30 09/18/2011 0438   GLUCOSE 78 09/18/2011 0438   BUN 30* 09/18/2011 0438   CREATININE 6.58* 09/18/2011 0438   CALCIUM 9.5 09/18/2011 0438   GFRNONAA 7* 09/18/2011 0438   GFRAA 8* 09/18/2011 0438     Micro Results: Recent Results (from the past 240 hour(s))  MRSA PCR SCREENING     Status: Normal   Collection Time   09/17/11  8:58 PM      Component Value Range Status Comment   MRSA by PCR NEGATIVE  NEGATIVE  Final     Studies/Results: Dg Chest Portable 1 View  09/17/2011  *RADIOLOGY REPORT*  Clinical Data: Dyspnea  PORTABLE CHEST - 1 VIEW  Comparison: 05/09/2011 06/12/2010  Findings:  Cardiomegaly and changes of median sternotomy for CABG. Right axillary vascular stent stable.  Diffuse pulmonary vascular congestion and mild diffuse interstitial prominence.  No focal airspace opacity or visible pleural effusion.  Dense atherosclerotic calcification of the transverse aortic arch.  Bony thorax is unremarkable.  IMPRESSION: Mild congestive heart failure pattern.  Original Report Authenticated By: Britta Mccreedy, M.D.   Medications: Scheduled Meds:   . sodium chloride   Intravenous STAT  . allopurinol  100 mg Oral BID  . antiseptic oral rinse   Mouth Rinse BID  . aspirin EC  81 mg Oral Daily  . b complex-vitamin c-folic acid  1 tablet Oral Daily  . calcium carbonate  1 tablet Oral Daily  . cinacalcet  30 mg Oral Q breakfast  . colchicine  0.6 mg Oral BID  . enoxaparin  30 mg Subcutaneous Q24H  . gabapentin  100 mg Oral Daily  . isosorbide mononitrate  30 mg Oral QAM  . nitroGLYCERIN      . nitroGLYCERIN  0.4 mg Sublingual Once  . pantoprazole  80 mg Oral Q1200  . polyethylene glycol powder  17 g Oral Daily  . sevelamer  2,400 mg Oral TID WC  . simvastatin  20 mg  Oral q1800  . DISCONTD: DIALYVITE 800 WITH ZINC  1 tablet Oral Daily  . DISCONTD: nitroGLYCERIN  2-200 mcg/min Intravenous Once  . DISCONTD: sevelamer  2,400 mg Oral TID WC   Continuous Infusions:  PRN Meds:.sodium chloride, acetaminophen, acetaminophen, bisacodyl, heparin, heparin, heparin, nitroGLYCERIN, ondansetron (ZOFRAN) IV, ondansetron, senna-docusate, traZODone  Assessment/Plan: Patient Active Hospital Problem List: Fluid overload (09/17/2011)   Assessment: s/p dialysis yesterday-per Dr. Pandora Leiter likely transition to regular tele in am if no plans for dialysis tomorrow   ESRD (end stage renal disease) on dialysis (09/17/2011)   Assessment: see above  Hyperkalemia (09/17/2011)   Assessment: resolved s/p dialysis-labvs am  Chest pain on exertion (09/17/2011)   Assessment: likely related to  vol overload >actual cardiogenic issues, EKG reviewed from last PM shows Peaked t waves, and sinus tach, but no real T wave changes-Last set of Cardiac enzymes P       HTN (hypertension) (09/17/2011)   Assessment: controlled, would add imdur 15 mg to the already dosed 30 mg, to help offload R heart-likely will help diurese     CAD (coronary artery disease) (09/17/2011)   Assessment: Stable-follow CE's-r[pt EKG-will notify reg Cardiologist, Dr. Dietrich Pates if any changes on EKG   LOS: 1 day   [facility] 09/18/2011, 8:13 AM

## 2011-09-18 NOTE — Progress Notes (Signed)
Received and reveiwed op poc.  Pt has hepB neg labs as of 09/13/11

## 2011-09-18 NOTE — Progress Notes (Signed)
Critical MB called of 6.6. Paged Dr Orvan Falconer, No new orders were given.

## 2011-09-18 NOTE — Progress Notes (Signed)
Subjective: Interval History: has no complaint of he doesn't have any difficulty breathing. And patient doesn't have any nausea or vomiting patient also denies any chest pain.. .  Objective: Vital signs in last 24 hours: Temp:  [97.8 F (36.6 C)-98.3 F (36.8 C)] 97.9 F (36.6 C) (10/22 0730) Pulse Rate:  [73-119] 73  (10/22 0630) Resp:  [14-30] 14  (10/22 0630) BP: (74-233)/(34-117) 142/73 mmHg (10/22 0630) SpO2:  [98 %-100 %] 100 % (10/22 0941) Weight:  [80.3 kg (177 lb 0.5 oz)-83.915 kg (185 lb)] 180 lb 1.9 oz (81.7 kg) (10/22 0453) Weight change:   Intake/Output from previous day: 10/21 0701 - 10/22 0700 In: 188.6 [I.V.:188.6] Out: 2866  Intake/Output this shift:    General appearance: alert Resp: clear to auscultation bilaterally Cardio: regular rate and rhythm, S1, S2 normal, no murmur, click, rub or gallop GI: soft, non-tender; bowel sounds normal; no masses,  no organomegaly Extremities: extremities normal, atraumatic, no cyanosis or edema  Lab Results:  Basename 09/17/11 1835 09/17/11 1810  WBC -- 4.7  HGB 12.2* 11.6*  HCT 36.0* 35.4*  PLT -- 104*   BMET:  Basename 09/18/11 0438 09/17/11 1835 09/17/11 1810  NA 136 138 --  K 4.5 5.8* --  CL 95* 104 --  CO2 30 -- 29  GLUCOSE 78 113* --  BUN 30* 65* --  CREATININE 6.58* 8.70* --  CALCIUM 9.5 -- 9.8   No results found for this basename: PTH:2 in the last 72 hours Iron Studies: No results found for this basename: IRON,TIBC,TRANSFERRIN,FERRITIN in the last 72 hours  Studies/Results: Dg Chest Portable 1 View  09/17/2011  *RADIOLOGY REPORT*  Clinical Data: Dyspnea  PORTABLE CHEST - 1 VIEW  Comparison: 05/09/2011 06/12/2010  Findings: Cardiomegaly and changes of median sternotomy for CABG. Right axillary vascular stent stable.  Diffuse pulmonary vascular congestion and mild diffuse interstitial prominence.  No focal airspace opacity or visible pleural effusion.  Dense atherosclerotic calcification of the  transverse aortic arch.  Bony thorax is unremarkable.  IMPRESSION: Mild congestive heart failure pattern.  Original Report Authenticated By: Britta Mccreedy, M.D.    I have reviewed the patient's current medications.  Assessment/Plan: Problem #1 CHF is status post her hemodialysis with ultrafiltration of 3 L patient seems to be feeling better today. Problem #2 history of end-stage renal disease is status post hemodialysis potassium has corrected and pending creatinine is was in acceptable range. Problem #3 history of anemia his hemoglobin and hematocrit is stable Problem #4 history of hypertension his blood pressure is controlled very well. Problem #5 history of coronary artery disease he is a symptomatic. Problem #6 history of GERD Problem #7 history of secondary hyperparathyroidism. Recommendation we'll do dialysis tomorrow possibly for 3 hours and we'll try to get about 2 to have liters if his blood pressure tolerates and the patient could be discharged to follow as is regular dialysis unit. We'll check his CBC and basic metabolic panel in the morning. We'll continue his other medications.    LOS: 1 day   Samrat Hayward S 09/18/2011,10:11 AM

## 2011-09-19 ENCOUNTER — Inpatient Hospital Stay (HOSPITAL_COMMUNITY): Payer: Medicare Other

## 2011-09-19 LAB — BASIC METABOLIC PANEL
BUN: 55 mg/dL — ABNORMAL HIGH (ref 6–23)
Calcium: 9.5 mg/dL (ref 8.4–10.5)
GFR calc Af Amer: 5 mL/min — ABNORMAL LOW (ref >90)
GFR calc non Af Amer: 5 mL/min — ABNORMAL LOW (ref >90)
Potassium: 4.9 mEq/L (ref 3.5–5.1)
Sodium: 137 mEq/L (ref 135–145)

## 2011-09-19 LAB — CBC
Hemoglobin: 10.6 g/dL — ABNORMAL LOW (ref 13.0–17.0)
MCHC: 32.2 g/dL (ref 30.0–36.0)
Platelets: 84 10*3/uL — ABNORMAL LOW (ref 150–400)
RBC: 3.35 MIL/uL — ABNORMAL LOW (ref 4.22–5.81)

## 2011-09-19 MED ORDER — COLCHICINE 0.6 MG PO TABS: 0.3000 mg | ORAL_TABLET | Freq: Two times a day (BID) | ORAL | Status: DC

## 2011-09-19 MED ORDER — EPOETIN ALFA 10000 UNIT/ML IJ SOLN
10000.0000 [IU] | INTRAMUSCULAR | Status: DC
  Administered 2011-09-19: 10000 [IU] via INTRAVENOUS
  Filled 2011-09-19: qty 1

## 2011-09-19 MED ORDER — ALLOPURINOL 100 MG PO TABS: 100.0000 mg | ORAL_TABLET | Freq: Every day | ORAL | Status: DC

## 2011-09-19 NOTE — Discharge Summary (Signed)
DISCHARGE SUMMARY  Andrew [name]  MR#: [id number]  DOB:[birth year]  Date of Admission: 09/17/2011 Date of Discharge: 09/19/2011  Attending Physician:Addie Alonge, Gerlean Ren, MD  Patient's JXB:JYNWG,NFAOZHYQ J., MD  Consults:Treatment Team:  Jamse Mead, Nephrology   Discharge Diagnoses: Present on Admission:  .ESRD (end stage renal disease) on dialysis .Hyperkalemia .Fluid overload .Chest pain on exertion .HTN (hypertension) .CAD (coronary artery disease) Acute on Chronic Congestive Heart Failure secondary to End Stage Renal Disease.    Current Discharge Medication List    CONTINUE these medications which have CHANGED   Details  allopurinol (ZYLOPRIM) 100 MG tablet Take 1 tablet (100 mg total) by mouth daily. Qty: 30 tablet, Refills: 0    colchicine 0.6 MG tablet Take 0.5 tablets (0.3 mg total) by mouth 2 (two) times daily. Qty: 30 tablet, Refills: 0      CONTINUE these medications which have NOT CHANGED   Details  atorvastatin (LIPITOR) 10 MG tablet Take 10 mg by mouth daily.      B Complex-C-Zn-Folic Acid (DIALYVITE 800/ZINC PO) Take 1 tablet by mouth daily.     calcium carbonate (TUMS - DOSED IN MG ELEMENTAL CALCIUM) 500 MG chewable tablet Chew 1 tablet by mouth daily.      cinacalcet (SENSIPAR) 30 MG tablet Take 30 mg by mouth daily.      gabapentin (NEURONTIN) 100 MG tablet Take 100 mg by mouth daily.      isosorbide mononitrate (IMDUR) 30 MG CR tablet Take 1 tablet (30 mg total) by mouth every morning. Qty: 30 tablet, Refills: 6    omeprazole (PRILOSEC) 40 MG capsule Take 40 mg by mouth daily.      polyethylene glycol powder (MIRALAX) powder Take 17 g by mouth daily.      sevelamer (RENAGEL) 800 MG tablet Take 2,400 mg by mouth 3 (three) times daily with meals. And patient takes 2 tablets with a snack    aspirin 81 MG tablet Take 81 mg by mouth daily.        STOP taking these medications                Hospital Course:Andrew [name] is an 76  y.o. male with history of coronary artery disease status post CABG, history of reccurent CHF and end-stage renal disease on maintenance hemodialysis.  Who presented with complaints of difficulty in breathing since Friday prior to admission. According to the patient it started with exertional dyspnea and then started having difficulty in breathing lying down. Patient has been admitted with similar history  before.  Per the patient his dry weight was increased and they were not pulling much fluid during dialysis.  Patient also says that he was having some chest pain, on the right side without any radiation. Presently patient's breathing has improved and he has no chest pain.  His last echo was in 2006= 1.  Technically adequate echocardiographic study.  2.  Mild left atrial enlargement; normal right atrial size.  3.  Normal right ventricular size and function; no RVH.  4.  Mild sclerosis of a trileaflet aortic valve; mild annular calcification.  5.  Normal tricuspid and pulmonic valve; no tricuspid regurgitation.  6.  Normal mitral valve; mild annular calcification.  7.  Normal left ventricular size with borderline hypertrophy. There is a      small area of hypokinesis of the base of the inferior wall. Overall      capital LV systolic function is otherwise normal.  8.  Comparison with prior study  of 03/31/2003: Small segmental wall motion      abnormality now noted as described.  Present on Admission:  .ESRD (end stage renal disease) on dialysis:  Patient had two hemodialysis sessions that appeared to resolve his symptoms.  He will return to his regular dialysis unit tomorrow (Wednesday) to resume outpatient dialysis. .Hyperkalemia - resolved with dialysis .Fluid overload - resolved with dialysis-likely 2/2 to possible CHF exacerbation-he has not had an echo since 2006 .Chest pain on exertion - Cardiac enzymes are negative, pain resolved with dialysis. Marland KitchenHTN (hypertension) - well controlled. Marland KitchenCAD  (coronary artery disease): stable-continue Imdur/Asa and Statin Acute on Chronic Congestive Heart Failure secondary to End Stage Renal Disease:  Improved with dialysis. See above note for EF.   Day of Discharge BP 138/78  Pulse 78  Temp(Src) 98.1 F (36.7 C) (Oral)  Resp 24  Ht 5\' 8"  (1.727 m)  Wt 81.7 kg (180 lb 1.9 oz)  BMI 27.39 kg/m2  SpO2 97%  Physical Exam: Physical Exam  Constitutional: He is oriented to person, place, and time and well-developed, well-nourished, and in no distress. No distress.  HENT:  Head: Normocephalic and atraumatic.  Neck: Neck supple. No thyromegaly present.  Cardiovascular: Normal rate, regular rhythm and normal heart sounds.  Exam reveals no gallop and no friction rub.   No murmur heard. Pulmonary/Chest: Effort normal. No respiratory distress. He has no wheezes. He has no rales. He exhibits no tenderness.  Abdominal: Soft. Bowel sounds are normal. He exhibits no distension. There is no tenderness.  Neurological: He is alert and oriented to person, place, and time.  Skin: Skin is warm and dry. He is not diaphoretic.  Psychiatric: Mood, memory, affect and judgment normal.    Results for orders placed during the hospital encounter of 09/17/11 (from the past 24 hour(s))  CARDIAC PANEL(CRET KIN+CKTOT+MB+TROPI)     Status: Abnormal   Collection Time   09/18/11  3:52 PM      Component Value Range   Total CK 89  7 - 232 (U/L)   CK, MB 4.8 (*) 0.3 - 4.0 (ng/mL)   Troponin I <0.30  <0.30 (ng/mL)   Relative Index RELATIVE INDEX IS INVALID  0.0 - 2.5   PHOSPHORUS     Status: Normal   Collection Time   09/18/11 11:36 PM      Component Value Range   Phosphorus 4.0  2.3 - 4.6 (mg/dL)  BASIC METABOLIC PANEL     Status: Abnormal   Collection Time   09/19/11  4:42 AM      Component Value Range   Sodium 137  135 - 145 (mEq/L)   Potassium 4.9  3.5 - 5.1 (mEq/L)   Chloride 96  96 - 112 (mEq/L)   CO2 30  19 - 32 (mEq/L)   Glucose, Bld 85  70 - 99 (mg/dL)    BUN 55 (*) 6 - 23 (mg/dL)   Creatinine, Ser 1.61 (*) 0.50 - 1.35 (mg/dL)   Calcium 9.5  8.4 - 09.6 (mg/dL)   GFR calc non Af Amer 5 (*) >90 (mL/min)   GFR calc Af Amer 5 (*) >90 (mL/min)  CBC     Status: Abnormal   Collection Time   09/19/11  4:42 AM      Component Value Range   WBC 4.7  4.0 - 10.5 (K/uL)   RBC 3.35 (*) 4.22 - 5.81 (MIL/uL)   Hemoglobin 10.6 (*) 13.0 - 17.0 (g/dL)   HCT 04.5 (*) 40.9 - 52.0 (%)  MCV 98.2  78.0 - 100.0 (fL)   MCH 31.6  26.0 - 34.0 (pg)   MCHC 32.2  30.0 - 36.0 (g/dL)   RDW 95.6  21.3 - 08.6 (%)   Platelets 84 (*) 150 - 400 (K/uL)   Dg Chest Portable 1 View  09/17/2011  *RADIOLOGY REPORT*  Clinical Data: Dyspnea  IMPRESSION: Mild congestive heart failure pattern.    Disposition: Improved and stable.  Will discharge to home.   Follow-up Appts:  [facility] 09/20/2011 at usual appointment time.    Signed: Mammie Russian 09/19/2011, 3:16 PM   CC: [name]., MD  Jamse Mead   I agree with above note as per Ms. York and salient exam findings and assesment and plan- I did call Klingberg,Diane Daughter [phone] to discuss with her the D/c plan Patient lives alone and also gives himself his medication-I recommended to his daughte rto have a HH RN asses the patientas hiome environs in order to work with him and assess his needs at home  [facility]

## 2011-09-19 NOTE — Progress Notes (Signed)
Pt d/c home spoke to pt and daughter concerning d/c arranged ahc at request of md and daughter

## 2011-09-19 NOTE — Progress Notes (Signed)
Subjective: Interval History: has no complaint of shortness of breath or orthopnea. Patient doesn't have any nausea vomiting. According to him he was able to  sleep last night..  Objective: Vital signs in last 24 hours: Temp:  [97.9 F (36.6 C)-98.3 F (36.8 C)] 97.9 F (36.6 C) (10/23 0600) Pulse Rate:  [65-96] 96  (10/22 1700) Resp:  [15-24] 24  (10/22 1700) BP: (124-149)/(45-87) 138/87 mmHg (10/22 1700) SpO2:  [93 %-100 %] 93 % (10/22 1700) Weight:  [81.7 kg (180 lb 1.9 oz)] 180 lb 1.9 oz (81.7 kg) (10/23 0500) Weight change: -2.216 kg (-4 lb 14.2 oz)  Intake/Output from previous day: 10/22 0701 - 10/23 0700 In: 345 [P.O.:345] Out: -  Intake/Output this shift:    General appearance: alert and cooperative Resp: clear to auscultation bilaterally Cardio: regular rate and rhythm, S1, S2 normal, no murmur, click, rub or gallop GI: soft, non-tender; bowel sounds normal; no masses,  no organomegaly Extremities: extremities normal, atraumatic, no cyanosis or edema  Lab Results:  Basename 09/19/11 0442 09/17/11 1835 09/17/11 1810  WBC 4.7 -- 4.7  HGB 10.6* 12.2* --  HCT 32.9* 36.0* --  PLT 84* -- 104*   BMET:  Basename 09/19/11 0442 09/18/11 0438  NA 137 136  K 4.9 4.5  CL 96 95*  CO2 30 30  GLUCOSE 85 78  BUN 55* 30*  CREATININE 9.28* 6.58*  CALCIUM 9.5 9.5   No results found for this basename: PTH:2 in the last 72 hours Iron Studies: No results found for this basename: IRON,TIBC,TRANSFERRIN,FERRITIN in the last 72 hours  Studies/Results: Dg Chest Portable 1 View  09/17/2011  *RADIOLOGY REPORT*  Clinical Data: Dyspnea  PORTABLE CHEST - 1 VIEW  Comparison: 05/09/2011 06/12/2010  Findings: Cardiomegaly and changes of median sternotomy for CABG. Right axillary vascular stent stable.  Diffuse pulmonary vascular congestion and mild diffuse interstitial prominence.  No focal airspace opacity or visible pleural effusion.  Dense atherosclerotic calcification of the transverse  aortic arch.  Bony thorax is unremarkable.  IMPRESSION: Mild congestive heart failure pattern.  Original Report Authenticated By: Britta Mccreedy, M.D.    I have reviewed the patient's current medications.  Assessment/Plan: Problem #1 CHF his status post  dialysis yesterday he presently is a symptomatic  Problem #2 end-stage renal disease is BUN and creatinine is  in acceptable range. Problem #3 history of hyperkalemia potassium is 4.9 corrected. Problem #4 history of anemia his hematocrit and hematocrit has gone down this is secondary to end-stage renal disease. Problem #4 history of coronary artery disease status post CABG he is a symptomatic. Problem #5 history of hypertension blood pressure seems to be controlled very well. Problem #6 history of hyperlipidemia Problem #7 history of peripherovascular disease Problem #8 secondary hyperparathyroidism. Recommendation will dialyze patient today for 3 hours and we'll try to remove about 2 L Since his regular schedule is Monday Wednesday Friday patient could be discharged today and advised to go to his unit tomorrow.    LOS: 2 days   Andrew [name] S 09/19/2011,8:19 AM

## 2011-09-19 NOTE — Plan of Care (Signed)
Outpatient plan of care reviewed  

## 2011-10-05 NOTE — Progress Notes (Signed)
Quick Note:  Persistent anemia and thrombocytopenia. No evidence of cirrhosis or splenomegaly. Recommend he discuss with PCP, ?need consultation with hematologist? Please send copy of labs to PCP. OV here for f/u anemia in 12/2011. ______

## 2011-10-16 ENCOUNTER — Encounter: Payer: Self-pay | Admitting: Internal Medicine

## 2011-12-08 ENCOUNTER — Ambulatory Visit (INDEPENDENT_AMBULATORY_CARE_PROVIDER_SITE_OTHER): Payer: Medicare Other | Admitting: Urology

## 2011-12-08 DIAGNOSIS — N400 Enlarged prostate without lower urinary tract symptoms: Secondary | ICD-10-CM

## 2011-12-08 DIAGNOSIS — Z8551 Personal history of malignant neoplasm of bladder: Secondary | ICD-10-CM

## 2011-12-08 DIAGNOSIS — C672 Malignant neoplasm of lateral wall of bladder: Secondary | ICD-10-CM

## 2011-12-08 DIAGNOSIS — Z85528 Personal history of other malignant neoplasm of kidney: Secondary | ICD-10-CM

## 2011-12-18 ENCOUNTER — Other Ambulatory Visit: Payer: Self-pay | Admitting: Urology

## 2011-12-22 ENCOUNTER — Encounter (HOSPITAL_BASED_OUTPATIENT_CLINIC_OR_DEPARTMENT_OTHER): Payer: Self-pay | Admitting: *Deleted

## 2011-12-22 NOTE — Progress Notes (Signed)
NPO AFTER MN. ARRIVES AT 1000. NEEDS ISTAT. CURRENT CXR AND EKG W/ CHART.  WILL TAKE IMDUR AM OF SURG. W/ SIP OF WATER.

## 2011-12-25 NOTE — H&P (Signed)
ive Problems 1. Benign Prostatic Hypertrophy 600.00 2. History of  Bladder Cancer V10.51 3. Chronic Renal Failure 585.9 4. Malignant Neoplasm Of The Lateral Wall Of The Bladder 188.2 5. History of  Nephrectomy With Total Ureterectomy Right  History of Present Illness     [name] is an 78 yo WM former patient of Dr. Vonita Moss with a history of urothelial cancer.  He has ESRD and continues on dialysis and voids very little. He has had no hematuria. He has a history of a right nephroureterostomy for transitional cell carcinoma in 2000. He had bladder carcinomas resected in 2005, 10 2006 in August of 2009. He had BCG in the past and in 2009 underwent treatment with BCG and Intron A. He was biopsied in 6/12 with no evidence of recurrent carcinoma.  He has no associated signs or symptoms.   Past Medical History 1. History of  Arthritis V13.4 2. History of  Asthma 493.90 3. History of  Bladder Cancer V10.51 4. History of  Cardiac Failure 428.9 5. History of  CT Abdominal Kidney Cysts 6. History of  Functional Murmur 785.2 7. History of  Gout 274.9 8. History of  Heart Disease 429.9 9. History of  Heartburn 787.1 10. History of  Hypercholesterolemia 272.0 11. History of  Hypertension 401.9 12. History of  Kidney Cancer V10.52 13. History of  Renal Dialysis Status V45.11 14. History of  Renal Failure 586 15. History of  Transient Ischemic Attack 435.9  Surgical History 1. History of  Cystoscopy With Biopsy 2. History of  Cystoscopy With Biopsy 3. History of  Cystoscopy With Biopsy 4. History of  Cystoscopy With Biopsy 5. History of  Cystoscopy With Fulguration Small Lesion (5-82mm) 6. History of  Heart Surgery 7. History of  Nephrectomy Right 8. History of  Nephrectomy With Total Ureterectomy Right 9. History of  Transurethral Fulguration For Post-op Bleeding  Current Meds 1. Allopurinol 100 MG Oral Tablet; Therapy: (Recorded:29Jan2008) to 2. Colchicine 0.6 MG TABS; Therapy:  (Recorded:11Aug2009) to 3. Dialyvite 800/Zinc TABS; Therapy: (Recorded:11Aug2009) to 4. Ecotrin Low Strength 81 MG Oral Tablet Delayed Release; Therapy: (Recorded:29Jan2008) to 5. Gabapentin 100 MG Oral Capsule; Therapy: 21Jan2012 to 6. Isosorbide Dinitrate TABS; Therapy: (Recorded:11Aug2009) to 7. Lansoprazole 30 MG Oral Capsule Delayed Release; Therapy: 25Apr2012 to 8. Lipitor TABS; Therapy: (Recorded:21Jun2012) to 9. MiraLax Oral Powder; Therapy: (Recorded:11Aug2009) to 10. Renagel 800 MG Oral Tablet; Therapy: (Recorded:11Aug2009) to 11. Sensipar 30 MG Oral Tablet; Therapy: (Recorded:11Aug2009) to  Allergies 1. No Known Drug Allergies  Family History 1. Paternal history of  Death In The Family Father 2. Maternal history of  Death In The Family Mother 3. Family history of  Family Health Status Number Of Children 4. Fraternal history of  Renal Failure 5. Paternal history of  Reported Previous Cardiac Problems  Social History 1. Marital History - Widowed 2. Retired From Work 3. History of  Tobacco Use V15.82 Quit 35 years ago; smoked 1 pack per day for 20 years Denied  4. Alcohol Use 5. History of  Caffeine Use  Review of Systems  Genitourinary: no hematuria.  Gastrointestinal: no flank pain and no abdominal pain.  Constitutional: no recent weight loss.  Cardiovascular: no chest pain.  Respiratory: shortness of breath.    Vitals Vital Signs [Data Includes: Last 1 Day]  11Jan2013 09:57AM  Blood Pressure: 175 / 73 11Jan2013 09:11AM  Blood Pressure: 175 / 157 Temperature: 97.8 F Respiration: 16  Physical Exam Constitutional: Well nourished and well developed . No acute distress.  Pulmonary: No respiratory distress and normal respiratory rhythm and effort.  Cardiovascular: Heart rate and rhythm are normal . No peripheral edema.  Abdomen: The abdomen is mildly obese. The abdomen is soft and nontender.  No inguinal hernia is present on the right.  No inguinal hernia is  present on the left.  Rectal: Rectal exam demonstrates normal sphincter tone, no tenderness and no masses. Estimated prostate size is 2+. The prostate has no nodularity and is not tender. The left seminal vesicle is nonpalpable. The right seminal vesicle is nonpalpable. The perineum is normal on inspection.  Genitourinary: Examination of the penis demonstrates phimosis, but no discharge, no masses, no lesions and a normal meatus. The penis is uncircumcised. The scrotum is without lesions. The right epididymis is palpably normal and non-tender. The left epididymis is palpably normal and non-tender. The right testis is non-tender and without masses. The left testis is non-tender and without masses.  Lymphatics: The femoral and inguinal nodes are not enlarged or tender.    Results/Data  Old records or history reviewed: I have reviewed Dr. Enos Fling records.    Procedure  Procedure: Cystoscopy  Procedure Note:  Urethral meatus:. No abnormalities.  Anterior urethra: No abnormalities.  Prostatic urethra: No abnormalities . Estimated length was 3 cm. The lateral prostatic lobes were enlarged. No intravesical median lobe was visualized.  Bladder: The ureteral orifices were not able to be identified. The mucosa was smooth without abnormalities. Examination of the bladder demonstrated no trabeculation. A solitary tumor was visualized in the bladder. A papillary tumor was seen in the bladder measuring approximately 1 cm in size. This tumor was located on the left side of the bladder. The patient tolerated the procedure well.  Complications: None.    Assessment 1. History of  Bladder Cancer V10.51 2. History of  Nephrectomy With Total Ureterectomy Right 3. Benign Prostatic Hypertrophy 600.00 4. Malignant Neoplasm Of The Lateral Wall Of The Bladder 188.2   He has a 1cm papillary tumor of the left wall.   Plan Malignant Neoplasm Of The Lateral Wall Of The Bladder (188.2)  1. URINE CYTOLOGY  Requested  for: 11Jan2013 PMH: Bladder Cancer (V10.51)  2. Cysto  Done: 11Jan2013   He needs a cystoscopy with left RTG and TURBT.   I am reluctant to give [facility] because of his anuria and concern for bladder wall contraction.  I have reviewed the risks of bleeding, infection, bladder wall injury, thrombotic events and anesthetic complications. He will need to be cleared by his nephrologist and medical doctor.

## 2011-12-28 ENCOUNTER — Ambulatory Visit (HOSPITAL_BASED_OUTPATIENT_CLINIC_OR_DEPARTMENT_OTHER)
Admission: RE | Admit: 2011-12-28 | Discharge: 2011-12-28 | Disposition: A | Discharge status: Home / Self Care | Payer: Medicare Other | Source: Ambulatory Visit | Attending: Urology | Admitting: Urology

## 2011-12-28 ENCOUNTER — Other Ambulatory Visit: Payer: Self-pay | Admitting: Urology

## 2011-12-28 ENCOUNTER — Encounter (HOSPITAL_BASED_OUTPATIENT_CLINIC_OR_DEPARTMENT_OTHER): Payer: Self-pay | Admitting: Anesthesiology

## 2011-12-28 ENCOUNTER — Encounter (HOSPITAL_BASED_OUTPATIENT_CLINIC_OR_DEPARTMENT_OTHER)
Admission: RE | Disposition: A | Discharge status: Home / Self Care | Payer: Self-pay | Source: Ambulatory Visit | Attending: Urology

## 2011-12-28 ENCOUNTER — Ambulatory Visit (HOSPITAL_BASED_OUTPATIENT_CLINIC_OR_DEPARTMENT_OTHER): Payer: Medicare Other | Admitting: Anesthesiology

## 2011-12-28 ENCOUNTER — Encounter (HOSPITAL_BASED_OUTPATIENT_CLINIC_OR_DEPARTMENT_OTHER): Payer: Self-pay | Admitting: *Deleted

## 2011-12-28 DIAGNOSIS — I10 Essential (primary) hypertension: Secondary | ICD-10-CM | POA: Insufficient documentation

## 2011-12-28 DIAGNOSIS — J45909 Unspecified asthma, uncomplicated: Secondary | ICD-10-CM | POA: Insufficient documentation

## 2011-12-28 DIAGNOSIS — Z85528 Personal history of other malignant neoplasm of kidney: Secondary | ICD-10-CM | POA: Insufficient documentation

## 2011-12-28 DIAGNOSIS — N400 Enlarged prostate without lower urinary tract symptoms: Secondary | ICD-10-CM | POA: Insufficient documentation

## 2011-12-28 DIAGNOSIS — E7800 Pure hypercholesterolemia, unspecified: Secondary | ICD-10-CM | POA: Insufficient documentation

## 2011-12-28 DIAGNOSIS — Z992 Dependence on renal dialysis: Secondary | ICD-10-CM | POA: Insufficient documentation

## 2011-12-28 DIAGNOSIS — I519 Heart disease, unspecified: Secondary | ICD-10-CM | POA: Insufficient documentation

## 2011-12-28 DIAGNOSIS — N186 End stage renal disease: Secondary | ICD-10-CM | POA: Insufficient documentation

## 2011-12-28 DIAGNOSIS — Z79899 Other long term (current) drug therapy: Secondary | ICD-10-CM | POA: Insufficient documentation

## 2011-12-28 DIAGNOSIS — M109 Gout, unspecified: Secondary | ICD-10-CM | POA: Insufficient documentation

## 2011-12-28 DIAGNOSIS — I120 Hypertensive chronic kidney disease with stage 5 chronic kidney disease or end stage renal disease: Secondary | ICD-10-CM | POA: Insufficient documentation

## 2011-12-28 DIAGNOSIS — Z906 Acquired absence of other parts of urinary tract: Secondary | ICD-10-CM | POA: Insufficient documentation

## 2011-12-28 DIAGNOSIS — Z8673 Personal history of transient ischemic attack (TIA), and cerebral infarction without residual deficits: Secondary | ICD-10-CM | POA: Insufficient documentation

## 2011-12-28 DIAGNOSIS — Z905 Acquired absence of kidney: Secondary | ICD-10-CM | POA: Insufficient documentation

## 2011-12-28 DIAGNOSIS — C672 Malignant neoplasm of lateral wall of bladder: Secondary | ICD-10-CM | POA: Insufficient documentation

## 2011-12-28 HISTORY — DX: Anemia, unspecified: D64.9

## 2011-12-28 HISTORY — DX: Adverse effect of unspecified anesthetic, initial encounter: T41.45XA

## 2011-12-28 HISTORY — DX: Unspecified hearing loss, unspecified ear: H91.90

## 2011-12-28 HISTORY — DX: Reserved for inherently not codable concepts without codable children: IMO0001

## 2011-12-28 HISTORY — DX: Unspecified mononeuropathy of unspecified lower limb: G57.90

## 2011-12-28 HISTORY — PX: TRANSURETHRAL RESECTION OF BLADDER TUMOR: SHX2575

## 2011-12-28 HISTORY — PX: CYSTOSCOPY W/ RETROGRADES: SHX1426

## 2011-12-28 HISTORY — DX: Other complications of anesthesia, initial encounter: T88.59XA

## 2011-12-28 LAB — POCT I-STAT 4, (NA,K, GLUC, HGB,HCT)
Glucose, Bld: 89 mg/dL (ref 70–99)
HCT: 35 % — ABNORMAL LOW (ref 39.0–52.0)
Hemoglobin: 11.9 g/dL — ABNORMAL LOW (ref 13.0–17.0)
Potassium: 5.2 mEq/L — ABNORMAL HIGH (ref 3.5–5.1)

## 2011-12-28 SURGERY — CYSTOSCOPY, WITH RETROGRADE PYELOGRAM
Anesthesia: General | Site: Bladder | Wound class: Clean Contaminated

## 2011-12-28 MED ORDER — HYDROCODONE-ACETAMINOPHEN 5-325 MG PO TABS: 1.0000 | ORAL_TABLET | Freq: Four times a day (QID) | ORAL | Status: AC | PRN

## 2011-12-28 MED ORDER — ONDANSETRON HCL 4 MG/2ML IJ SOLN
INTRAMUSCULAR | Status: DC | PRN
  Administered 2011-12-28: 4 mg via INTRAVENOUS

## 2011-12-28 MED ORDER — PROMETHAZINE HCL 25 MG/ML IJ SOLN: 6.2500 mg | INTRAMUSCULAR | Status: DC | PRN

## 2011-12-28 MED ORDER — LACTATED RINGERS IV SOLN
INTRAVENOUS | Status: DC
  Administered 2011-12-28: 100 mL/h via INTRAVENOUS

## 2011-12-28 MED ORDER — LIDOCAINE HCL (CARDIAC) 20 MG/ML IV SOLN
INTRAVENOUS | Status: DC | PRN
  Administered 2011-12-28: 80 mg via INTRAVENOUS

## 2011-12-28 MED ORDER — STERILE WATER FOR IRRIGATION IR SOLN
Status: DC | PRN
  Administered 2011-12-28: 3000 mL

## 2011-12-28 MED ORDER — PROPOFOL 10 MG/ML IV EMUL
INTRAVENOUS | Status: DC | PRN
  Administered 2011-12-28: 200 mg via INTRAVENOUS

## 2011-12-28 MED ORDER — CIPROFLOXACIN IN D5W 400 MG/200ML IV SOLN
INTRAVENOUS | Status: DC | PRN
Start: 2011-12-28 — End: 2011-12-28
  Administered 2011-12-28: 400 mg via INTRAVENOUS

## 2011-12-28 MED ORDER — IOHEXOL 350 MG/ML SOLN
INTRAVENOUS | Status: DC | PRN
  Administered 2011-12-28: 10 mL via INTRAVENOUS

## 2011-12-28 MED ORDER — SODIUM CHLORIDE 0.9 % IR SOLN
Status: DC | PRN
  Administered 2011-12-28: 3000 mL

## 2011-12-28 MED ORDER — CIPROFLOXACIN IN D5W 400 MG/200ML IV SOLN
400.0000 mg | INTRAVENOUS | Status: AC
  Administered 2011-12-28: 400 mg via INTRAVENOUS

## 2011-12-28 MED ORDER — LACTATED RINGERS IV SOLN
INTRAVENOUS | Status: DC | PRN
  Administered 2011-12-28: 11:00:00 via INTRAVENOUS

## 2011-12-28 MED ORDER — LACTATED RINGERS IV SOLN: INTRAVENOUS | Status: DC

## 2011-12-28 MED ORDER — FENTANYL CITRATE 0.05 MG/ML IJ SOLN: 25.0000 ug | INTRAMUSCULAR | Status: DC | PRN

## 2011-12-28 MED ORDER — FENTANYL CITRATE 0.05 MG/ML IJ SOLN
INTRAMUSCULAR | Status: DC | PRN
Start: 2011-12-28 — End: 2011-12-28
  Administered 2011-12-28 (×2): 50 ug via INTRAVENOUS

## 2011-12-28 SURGICAL SUPPLY — 47 items
BAG DRAIN URO-CYSTO SKYTR STRL (DRAIN) ×3 IMPLANT
BAG DRN ANRFLXCHMBR STRAP LEK (BAG)
BAG DRN UROCATH (DRAIN) ×2
BAG URINE DRAINAGE (UROLOGICAL SUPPLIES) IMPLANT
BAG URINE LEG 19OZ MD ST LTX (BAG) IMPLANT
BASKET LASER NITINOL 1.9FR (BASKET) IMPLANT
BASKET SEGURA 3FR (UROLOGICAL SUPPLIES) IMPLANT
BASKET STNLS GEMINI 4WIRE 3FR (BASKET) IMPLANT
BASKET ZERO TIP NITINOL 2.4FR (BASKET) IMPLANT
BRUSH URET BIOPSY 3F (UROLOGICAL SUPPLIES) IMPLANT
BSKT STON RTRVL 120 1.9FR (BASKET)
BSKT STON RTRVL GEM 120X11 3FR (BASKET)
BSKT STON RTRVL ZERO TP 2.4FR (BASKET)
CANISTER SUCT LVC 12 LTR MEDI- (MISCELLANEOUS) IMPLANT
CATH FOLEY 2WAY SLVR  5CC 20FR (CATHETERS)
CATH FOLEY 2WAY SLVR  5CC 22FR (CATHETERS)
CATH FOLEY 2WAY SLVR 5CC 20FR (CATHETERS) IMPLANT
CATH FOLEY 2WAY SLVR 5CC 22FR (CATHETERS) IMPLANT
CATH URET 5FR 28IN CONE TIP (BALLOONS)
CATH URET 5FR 28IN OPEN ENDED (CATHETERS) ×1 IMPLANT
CATH URET 5FR 70CM CONE TIP (BALLOONS) IMPLANT
CLOTH BEACON ORANGE TIMEOUT ST (SAFETY) ×3 IMPLANT
DRAPE CAMERA CLOSED 9X96 (DRAPES) ×3 IMPLANT
ELECT LOOP HF 26F 30D .35MM (CUTTING LOOP) IMPLANT
ELECT NEEDLE 45D HF 24-28F 12D (CUTTING LOOP) IMPLANT
ELECT REM PT RETURN 9FT ADLT (ELECTROSURGICAL) ×3
ELECTRODE REM PT RTRN 9FT ADLT (ELECTROSURGICAL) ×2 IMPLANT
GLOVE SURG SS PI 8.0 STRL IVOR (GLOVE) ×3 IMPLANT
GOWN STRL REIN XL XLG (GOWN DISPOSABLE) ×3 IMPLANT
GOWN XL W/COTTON TOWEL STD (GOWNS) ×3 IMPLANT
GUIDEWIRE 0.038 PTFE COATED (WIRE) IMPLANT
GUIDEWIRE ANG ZIPWIRE 038X150 (WIRE) IMPLANT
GUIDEWIRE STR DUAL SENSOR (WIRE) ×3 IMPLANT
HOLDER FOLEY CATH W/STRAP (MISCELLANEOUS) IMPLANT
KIT ASPIRATION TUBING (SET/KITS/TRAYS/PACK) ×1 IMPLANT
KIT BALLIN UROMAX 15FX10 (LABEL) IMPLANT
KIT BALLN UROMAX 15FX4 (MISCELLANEOUS) IMPLANT
KIT BALLN UROMAX 26 75X4 (MISCELLANEOUS)
LASER FIBER DISP (UROLOGICAL SUPPLIES) IMPLANT
LASER FIBER DISP 1000U (UROLOGICAL SUPPLIES) IMPLANT
LOOP CUTTING 24FR OLYMPUS (CUTTING LOOP) ×1 IMPLANT
PACK CYSTOSCOPY (CUSTOM PROCEDURE TRAY) ×3 IMPLANT
PLUG CATH AND CAP STER (CATHETERS) IMPLANT
SET HIGH PRES BAL DIL (LABEL)
SHEATH URET ACCESS 12FR/35CM (UROLOGICAL SUPPLIES) IMPLANT
SHEATH URET ACCESS 12FR/55CM (UROLOGICAL SUPPLIES) IMPLANT
SYRINGE IRR TOOMEY STRL 70CC (SYRINGE) IMPLANT

## 2011-12-28 NOTE — Progress Notes (Signed)
Last dialysis yesterday, 1/30

## 2011-12-28 NOTE — Brief Op Note (Signed)
12/28/2011  11:46 AM  PATIENT:  Andrew [name]  78 y.o. male  PRE-OPERATIVE DIAGNOSIS:  BLADDER TUMOR  POST-OPERATIVE DIAGNOSIS: 7mm Bladder tumor with normal left retrograde pyelogram.  PROCEDURE:  Procedure(s): CYSTOSCOPY WITH RETROGRADE PYELOGRAM TRANSURETHRAL RESECTION OF BLADDER TUMOR (TURBT)  SURGEON:  Surgeon(s): [name], MD  PHYSICIAN ASSISTANT:   ASSISTANTS: none   ANESTHESIA:   general  EBL:     BLOOD ADMINISTERED:none  DRAINS: none   LOCAL MEDICATIONS USED:  NONE  SPECIMEN:  Source of Specimen:  biopsy of bladder tumor and adjacent mucosa.  DISPOSITION OF SPECIMEN:  PATHOLOGY  COUNTS:  YES  TOURNIQUET:  * No tourniquets in log *  DICTATION: .Other Dictation: Dictation Number [id number]  PLAN OF CARE: Discharge to home after PACU  PATIENT DISPOSITION:  PACU - hemodynamically stable.   Delay start of Pharmacological VTE agent (>24hrs) due to surgical blood loss or risk of bleeding:  {YES/NO/NOT APPLICABLE:20182

## 2011-12-28 NOTE — Anesthesia Procedure Notes (Signed)
Procedure Name: LMA Insertion Date/Time: 12/28/2011 11:25 AM Performed by: Renella Cunas D Pre-anesthesia Checklist: Patient identified, Emergency Drugs available, Suction available and Patient being monitored Patient Re-evaluated:Patient Re-evaluated prior to inductionOxygen Delivery Method: Circle System Utilized Preoxygenation: Pre-oxygenation with 100% oxygen Intubation Type: IV induction Ventilation: Mask ventilation without difficulty LMA: LMA with gastric port inserted LMA Size: 4.0 Number of attempts: 1 Placement Confirmation: positive ETCO2 Tube secured with: Tape Dental Injury: Teeth and Oropharynx as per pre-operative assessment

## 2011-12-28 NOTE — Anesthesia Postprocedure Evaluation (Signed)
  Anesthesia Post-op Note  Patient: Andrew [name]  Procedure(s) Performed:  CYSTOSCOPY WITH RETROGRADE PYELOGRAM - C-ARM PT IS ON DIALYSIS M,W,F; TRANSURETHRAL RESECTION OF BLADDER TUMOR (TURBT)  Patient Location: PACU  Anesthesia Type: General  Level of Consciousness: awake and alert   Airway and Oxygen Therapy: Patient Spontanous Breathing  Post-op Pain: mild  Post-op Assessment: Post-op Vital signs reviewed, Patient's Cardiovascular Status Stable, Respiratory Function Stable, Patent Airway and No signs of Nausea or vomiting  Post-op Vital Signs: stable  Complications: No apparent anesthesia complications

## 2011-12-28 NOTE — Anesthesia Preprocedure Evaluation (Addendum)
Anesthesia Evaluation  Patient identified by MRN, date of birth, ID band Patient awake    Reviewed: Allergy & Precautions, H&P , NPO status , Patient's Chart, lab work & pertinent test results  History of Anesthesia Complications (+) PROLONGED EMERGENCE  Airway Mallampati: II TM Distance: >3 FB Neck ROM: full    Dental  (+) Caps and Dental Advisory Given,    Pulmonary neg pulmonary ROS,  clear to auscultation  Pulmonary exam normal       Cardiovascular Exercise Tolerance: Good hypertension, On Medications + CAD, + CABG and +CHF regular Normal CHF related to renal failure and dialysis.  Stable.   Neuro/Psych CVA, No Residual Symptoms Negative Neurological ROS  Negative Psych ROS   GI/Hepatic negative GI ROS, Neg liver ROS, GERD-  Medicated and Controlled,  Endo/Other  Negative Endocrine ROSSecondary hyperparathyroidism  Renal/GU CRF and DialysisRenal disease  Genitourinary negative   Musculoskeletal   Abdominal   Peds  Hematology negative hematology ROS (+)   Anesthesia Other Findings   Reproductive/Obstetrics negative OB ROS                          Anesthesia Physical Anesthesia Plan  ASA: III  Anesthesia Plan: General   Post-op Pain Management:    Induction: Intravenous  Airway Management Planned: LMA  Additional Equipment:   Intra-op Plan:   Post-operative Plan:   Informed Consent: I have reviewed the patients History and Physical, chart, labs and discussed the procedure including the risks, benefits and alternatives for the proposed anesthesia with the patient or authorized representative who has indicated his/her understanding and acceptance.   Dental Advisory Given  Plan Discussed with: CRNA and Surgeon  Anesthesia Plan Comments:         Anesthesia Quick Evaluation

## 2011-12-28 NOTE — Interval H&P Note (Signed)
History and Physical Interval Note:  12/28/2011 11:05 AM  [name]  has presented today for surgery, with Andrew diagnosis of BLADDER TUMOR  Andrew various methods of treatment have been discussed with Andrew patient and family. After consideration of risks, benefits and other options for treatment, Andrew patient has consented to  Procedure(s): CYSTOSCOPY WITH RETROGRADE PYELOGRAM TRANSURETHRAL RESECTION OF BLADDER TUMOR (TURBT) as a surgical intervention .  Andrew patients' history has been reviewed, patient examined, no change in status, stable for surgery.  I have reviewed Andrew patients' chart and labs.  Questions were answered to Andrew patient's satisfaction.     Sirron Francesconi J

## 2011-12-28 NOTE — Progress Notes (Signed)
Spoke with Dr. Annabell Howells   Patient does not need to void before being discharge

## 2011-12-28 NOTE — Transfer of Care (Signed)
Immediate Anesthesia Transfer of Care Note  Patient: Andrew [name]  Procedure(s) Performed:  CYSTOSCOPY WITH RETROGRADE PYELOGRAM - C-ARM PT IS ON DIALYSIS M,W,F; TRANSURETHRAL RESECTION OF BLADDER TUMOR (TURBT)  Patient Location: PACU  Anesthesia Type: General  Level of Consciousness: awake, sedated, patient cooperative and responds to stimulation  Airway & Oxygen Therapy: Patient Spontanous Breathing and Patient connected to face mask oxygen  Post-op Assessment: Report given to PACU RN, Post -op Vital signs reviewed and stable and Patient moving all extremities  Post vital signs: Reviewed and stable  Complications: No apparent anesthesia complications

## 2011-12-29 ENCOUNTER — Encounter (HOSPITAL_BASED_OUTPATIENT_CLINIC_OR_DEPARTMENT_OTHER): Payer: Self-pay | Admitting: Urology

## 2011-12-29 NOTE — Op Note (Signed)
NAME:  Andrew [name], Andrew [name] NO.:  [id number]  MEDICAL RECORD NO.:  [id number]  LOCATION:                                 FACILITY:  PHYSICIAN:  Excell Seltzer. Annabell Howells, M.D.    DATE OF BIRTH:  29-Dec-1925  DATE OF PROCEDURE:  12/28/2011 DATE OF DISCHARGE:                              OPERATIVE REPORT   PROCEDURE:  Cystoscopy with biopsy and fulguration of a 7-mm bladder tumor.  PREOPERATIVE DIAGNOSIS:  Bladder tumor.  POSTOPERATIVE DIAGNOSIS:  Bladder tumor with a normal left retrograde pyelogram.  SURGEON:  Excell Seltzer. Annabell Howells, M.D.  ANESTHESIA:  General.  SPECIMEN:  Biopsies of the actual tumor and the adjacent mucosa.  DRAINS:  None.  BLOOD LOSS:  None.  COMPLICATIONS:  None.  INDICATIONS:  [name] is an 78 year old white male with a history of bladder cancer who was recently evaluated with surveillance cystoscopy and was found to have approximately 7-10 mm tumor on the left lateral wall of  the bladder.  It appeared papillary and superficial, but it was felt that resection was indicated.  He was severely oliguric with end- stage renal disease and has had a prior right nephroureterectomy, so it was felt a left retrograde pyelogram was indicated.  FINDINGS AND PROCEDURE:  He was given Cipro, he was taken to the operating room where general anesthetic was induced.  He was placed in lithotomy position.  His perineum and genitalia were prepped with Betadine solution.  He was draped in usual sterile fashion.  Cystoscopy was performed using the 22-French scope and 12 and 70 degree lenses. Examination revealed a normal urethra.  The external sphincter was intact.  Prostatic urethra had bilobar hyperplasia with minimal obstruction.  Examination of bladder revealed mild trabeculation.  The bladder was somewhat contracted, the mucosa was unremarkable with the exception of a low-grade appearing papillary tumor on the left lateral wall that appeared to measure approximately  7-10 mm.  The tumor was on a stalk and the surrounding mucosa on initial evaluation was unremarkable.  The left ureteral orifice was unremarkable.  The right ureteral orifice was absent.  A 5-French open-end catheter was inserted and placed in the left ureteral orifice.  Contrast was instilled.  There were some J-hooking of the distal ureter, but the ureter and internal collecting system were delicate without filling defects.  At this point, a cup biopsy forcep was felt to be adequate for removal of the tumor.  This was passed through the scope and the tumor was grasped and removed.  A biopsy of the adjacent mucosa was also performed as there was a little cobblestoning in this area once the primary tumor was removed.  At this point, the biopsy sites were fulgurated with a Bugbee electrode for a total area of approximately a centimeter.  Once hemostasis was achieved, the bladder was drained.  The scope was removed.  He was taken down from a lithotomy position, his anesthetic was reversed.  He was moved to the recovery room in stable condition.  There were no complications.     Excell Seltzer. Annabell Howells, M.D.     JJW/MEDQ  D:  12/28/2011  T:  12/29/2011  Job:  [id number]  cc:   Madelin Rear. Sherwood [name], MD Fax: [phone]  [facility] Fax: [phone]

## 2012-01-08 ENCOUNTER — Encounter: Payer: Self-pay | Admitting: Internal Medicine

## 2012-01-09 ENCOUNTER — Ambulatory Visit (INDEPENDENT_AMBULATORY_CARE_PROVIDER_SITE_OTHER): Payer: Medicare Other | Admitting: Cardiology

## 2012-01-09 ENCOUNTER — Encounter: Payer: Self-pay | Admitting: Cardiology

## 2012-01-09 DIAGNOSIS — N186 End stage renal disease: Secondary | ICD-10-CM

## 2012-01-09 DIAGNOSIS — I10 Essential (primary) hypertension: Secondary | ICD-10-CM

## 2012-01-09 DIAGNOSIS — Z8711 Personal history of peptic ulcer disease: Secondary | ICD-10-CM

## 2012-01-09 DIAGNOSIS — N2581 Secondary hyperparathyroidism of renal origin: Secondary | ICD-10-CM

## 2012-01-09 DIAGNOSIS — D649 Anemia, unspecified: Secondary | ICD-10-CM

## 2012-01-09 DIAGNOSIS — Z8551 Personal history of malignant neoplasm of bladder: Secondary | ICD-10-CM | POA: Insufficient documentation

## 2012-01-09 DIAGNOSIS — I2510 Atherosclerotic heart disease of native coronary artery without angina pectoris: Secondary | ICD-10-CM

## 2012-01-09 DIAGNOSIS — G609 Hereditary and idiopathic neuropathy, unspecified: Secondary | ICD-10-CM

## 2012-01-09 DIAGNOSIS — C649 Malignant neoplasm of unspecified kidney, except renal pelvis: Secondary | ICD-10-CM | POA: Insufficient documentation

## 2012-01-09 DIAGNOSIS — E785 Hyperlipidemia, unspecified: Secondary | ICD-10-CM

## 2012-01-09 DIAGNOSIS — G629 Polyneuropathy, unspecified: Secondary | ICD-10-CM

## 2012-01-09 MED ORDER — GABAPENTIN 100 MG PO CAPS: 100.0000 mg | ORAL_CAPSULE | Freq: Two times a day (BID) | ORAL | Status: DC

## 2012-01-09 NOTE — Progress Notes (Signed)
Patient ID: Andrew [name], male   DOB: [birth year], 78 y.o.   MRN: [id number] HPI: Scheduled return visit for this very nice gentleman with a history of coronary artery disease and previous CABG surgery, multiple cardiovascular risk factors and end-stage renal disease for the past 10 years.  Despite a multiplicity of medical problems, he has avoided the need for hospitalization or urgent medical care in recent months.  He has had no problems with vascular access or with dialysis.  He reports nocturnal pain in his legs that is worse with elevation and improved by walking.  He denies claudication, but has been told of neuropathy and started on Neurontin at a dose of 100 mg per day.  That was initially quite beneficial, but now is providing no relief.  Prior to Admission medications   Medication Sig Start Date End Date Taking? Authorizing Provider  allopurinol (ZYLOPRIM) 100 MG tablet Take 1 tablet (100 mg total) by mouth daily. 09/19/11  Yes Pleas Koch, MD  aspirin 81 MG tablet Take 81 mg by mouth daily.    Yes Historical Provider, MD  atorvastatin (LIPITOR) 10 MG tablet Take 10 mg by mouth daily.    Yes Historical Provider, MD  B Complex-C-Zn-Folic Acid (DIALYVITE 800/ZINC PO) Take 1 tablet by mouth daily.    Yes Historical Provider, MD  calcium carbonate (TUMS - DOSED IN MG ELEMENTAL CALCIUM) 500 MG chewable tablet Chew 1 tablet by mouth daily.    Yes Historical Provider, MD  cinacalcet (SENSIPAR) 30 MG tablet Take 30 mg by mouth daily.    Yes Historical Provider, MD  colchicine 0.6 MG tablet Take 0.5 tablets (0.3 mg total) by mouth 2 (two) times daily. 09/19/11  Yes Pleas Koch, MD  gabapentin (NEURONTIN) 100 MG tablet Take 100 mg by mouth daily.    Yes Historical Provider, MD  isosorbide mononitrate (IMDUR) 30 MG CR tablet Take 1 tablet (30 mg total) by mouth every morning. 07/18/11  Yes Joni Reining, NP  omeprazole (PRILOSEC) 40 MG capsule Take 40 mg by mouth every evening.    Yes Historical  Provider, MD  polyethylene glycol powder (MIRALAX) powder Take 17 g by mouth daily.    Yes Historical Provider, MD  sevelamer (RENAGEL) 800 MG tablet Take 2,400 mg by mouth 3 (three) times daily with meals. And patient takes 2 tablets with a snack   Yes Historical Provider, MD   No Known Allergies    Past medical history, social history, and family history reviewed and updated.  ROS: Denies chest discomfort, orthopnea, PND, palpitations, lightheadedness and syncope.  Chronic stable class II dyspnea on exertion.  PHYSICAL EXAM: BP 130/72  Pulse 90  Resp 18  Ht 5\' 8"  (1.727 m)  Wt 80.287 kg (177 lb)  BMI 26.91 kg/m2; weight has decreased 7 pounds since last visit 6 months ago General-Well developed; no acute distress Body habitus-proportionate weight and height Neck-No JVD; no carotid bruits Lungs-clear lung fields; resonant to percussion Cardiovascular-normal PMI; normal S1 and S2; grade1-2 systolic murmur at the left sternal border Abdomen-normal bowel sounds; soft and non-tender without masses or organomegaly Musculoskeletal-No deformities, no cyanosis or clubbing Neurologic-Normal cranial nerves; symmetric strength and tone; markedly decreased vibratory sense in lower extremities; decreased reflexes throughout, more prominent in the lower extremities Skin-Warm, no significant lesions Extremities-distal pulses intact but decreased-1+; no edema; +thrill in right upper arm fistula  ASSESSMENT AND PLAN:  [name], MD 01/09/2012 1:30 PM

## 2012-01-09 NOTE — Assessment & Plan Note (Addendum)
No symptoms at present to suggest the presence of myocardial ischemia 15 years out from CABG surgery.  Our focus will continue to be on optimal control of cardiovascular risk factors.  Isosorbide mononitrate is being administered in a minimal if not subtherapeutic dose, is likely not providing any benefit, and will be discontinued.

## 2012-01-09 NOTE — Assessment & Plan Note (Signed)
Laboratory study results requested from Dr. Lowell Guitar and the [facility].

## 2012-01-09 NOTE — Patient Instructions (Signed)
Your physician recommends that you schedule a follow-up appointment in: 9 months  Your physician has recommended you make the following change in your medication:  1 - STOP Imdur 2 - INCREASE Gabapentin to 100 mg twice daily

## 2012-01-09 NOTE — Assessment & Plan Note (Signed)
Anemia has virtually resolved.  The patient reports treatment with medication for anemia, but it is unclear whether this represents intravenous iron or an ESA.

## 2012-01-09 NOTE — Assessment & Plan Note (Signed)
Blood pressure control is excellent; current medications will be continued. 

## 2012-01-09 NOTE — Assessment & Plan Note (Signed)
Patient has done remarkably well in his first decade of hemodialysis and hopefully will continue to do so under the expert guidance of [facility].

## 2012-01-12 ENCOUNTER — Ambulatory Visit (INDEPENDENT_AMBULATORY_CARE_PROVIDER_SITE_OTHER): Payer: Medicare Other | Admitting: Urology

## 2012-01-12 DIAGNOSIS — C672 Malignant neoplasm of lateral wall of bladder: Secondary | ICD-10-CM

## 2012-01-16 ENCOUNTER — Ambulatory Visit: Payer: Medicare Other | Admitting: Urology

## 2012-01-28 ENCOUNTER — Encounter: Payer: Self-pay | Admitting: Cardiology

## 2012-02-16 ENCOUNTER — Other Ambulatory Visit (HOSPITAL_COMMUNITY): Payer: Self-pay | Admitting: Nephrology

## 2012-02-16 ENCOUNTER — Encounter (HOSPITAL_COMMUNITY): Payer: Self-pay | Admitting: Pharmacy Technician

## 2012-02-16 DIAGNOSIS — N186 End stage renal disease: Secondary | ICD-10-CM

## 2012-02-16 DIAGNOSIS — M7989 Other specified soft tissue disorders: Secondary | ICD-10-CM

## 2012-02-19 ENCOUNTER — Ambulatory Visit (HOSPITAL_COMMUNITY)
Admission: RE | Admit: 2012-02-19 | Discharge: 2012-02-19 | Disposition: A | Discharge status: Home / Self Care | Payer: Medicare Other | Source: Ambulatory Visit | Attending: Nephrology | Admitting: Nephrology

## 2012-02-19 ENCOUNTER — Ambulatory Visit (HOSPITAL_COMMUNITY)
Admission: RE | Admit: 2012-02-19 | Discharge: 2012-02-19 | Payer: Medicare Other | Source: Ambulatory Visit | Attending: Nephrology | Admitting: Nephrology

## 2012-02-19 DIAGNOSIS — M7989 Other specified soft tissue disorders: Secondary | ICD-10-CM

## 2012-02-19 DIAGNOSIS — I82619 Acute embolism and thrombosis of superficial veins of unspecified upper extremity: Secondary | ICD-10-CM | POA: Insufficient documentation

## 2012-02-19 DIAGNOSIS — N186 End stage renal disease: Secondary | ICD-10-CM | POA: Insufficient documentation

## 2012-02-19 DIAGNOSIS — R609 Edema, unspecified: Secondary | ICD-10-CM | POA: Insufficient documentation

## 2012-02-19 DIAGNOSIS — I82B29 Chronic embolism and thrombosis of unspecified subclavian vein: Secondary | ICD-10-CM | POA: Insufficient documentation

## 2012-02-19 MED ORDER — IOHEXOL 300 MG/ML  SOLN
31.0000 mL | Freq: Once | INTRAMUSCULAR | Status: AC | PRN
  Administered 2012-02-19: 31 mL via INTRAVENOUS

## 2012-02-19 MED ORDER — IOHEXOL 300 MG/ML  SOLN: 35.0000 mL | Freq: Once | INTRAMUSCULAR | Status: AC | PRN

## 2012-02-19 NOTE — Procedures (Signed)
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  See full dictated Radiology report.

## 2012-02-26 ENCOUNTER — Other Ambulatory Visit: Payer: Self-pay | Admitting: *Deleted

## 2012-02-26 DIAGNOSIS — N186 End stage renal disease: Secondary | ICD-10-CM

## 2012-02-27 ENCOUNTER — Encounter (HOSPITAL_COMMUNITY): Payer: Self-pay | Admitting: Pharmacy Technician

## 2012-02-27 ENCOUNTER — Encounter: Payer: Self-pay | Admitting: Vascular Surgery

## 2012-02-27 ENCOUNTER — Ambulatory Visit (INDEPENDENT_AMBULATORY_CARE_PROVIDER_SITE_OTHER): Payer: Medicare Other | Admitting: Vascular Surgery

## 2012-02-27 ENCOUNTER — Other Ambulatory Visit: Payer: Self-pay

## 2012-02-27 VITALS — BP 158/63 | HR 80 | Resp 16 | Ht 68.0 in | Wt 181.4 lb

## 2012-02-27 DIAGNOSIS — N186 End stage renal disease: Secondary | ICD-10-CM

## 2012-02-27 NOTE — Progress Notes (Signed)
Patient presents today for discussion of access for hemodialysis. Currently is being dialyzed via a right arm AV fistula initially placed in July of 2010. He has had multiple interventions on this since that time. Most recently he underwent fistulogram in interventional radiology on 02/19/2012. I reviewed these images. This reveals occlusion of the central portion of the fistula with subclavian occlusion as well. Despite this he is having adequate dialysis currently but is having a great deal of swelling in his right arm. He dialyzed successfully yesterday. He has had multiple prior left arm accesses with a forearm loop and upper arm graft. He is here to discuss other options.  Past Medical History  Diagnosis Date  . Secondary hyperparathyroidism   . Peripheral vascular disease   . Hypertension   . Hyperlipidemia   . Duodenal ulcer     remote  . Gout STABLE  . ESRD (end stage renal disease) NEPHROLOGIST-   DR [name]    Hemodialysis; Hx of bleeding from aneurysms on AVF  . Arteriosclerotic cardiovascular disease (ASCVD) 1998    CABG-1998; h/o CHF  . Cerebrovascular disease REMOTE --  PER CHART--    NO RESIDUAL  . Neuropathy of foot COLD FEET  . Degenerative joint disease   . Gastroesophageal reflux disease   . History of bladder cancer   . Impaired hearing BILATERAL     ONLY WEARS LEFT HEARING AID  . Blood transfusion   . Anemia   . Renal cell carcinoma 2001    s/p right nephrectomy-2001; subsequent ESRD    History  Substance Use Topics  . Smoking status: Former Smoker -- 1.0 packs/day for 25 years    Types: Cigarettes    Quit date: 02/26/1984  . Smokeless tobacco: Never Used   Comment: quit 1985  . Alcohol Use: No    Family History  Problem Relation Age of Onset  . Colon cancer Neg Hx   . Liver disease Neg Hx   . Heart disease Father     No Known Allergies  Current outpatient prescriptions:acetaminophen (TYLENOL) 500 MG tablet, Take 1,000 mg by mouth every 6 (six) hours  as needed. For pain/headache, Disp: , Rfl: ;  allopurinol (ZYLOPRIM) 100 MG tablet, Take 100 mg by mouth daily., Disp: , Rfl: ;  aspirin 81 MG tablet, Take 81 mg by mouth daily. , Disp: , Rfl: ;  atorvastatin (LIPITOR) 10 MG tablet, Take 10 mg by mouth daily. , Disp: , Rfl:  B Complex-C-Zn-Folic Acid (DIALYVITE 800/ZINC PO), Take 1 tablet by mouth daily. , Disp: , Rfl: ;  calcium carbonate (TUMS - DOSED IN MG ELEMENTAL CALCIUM) 500 MG chewable tablet, Chew 1 tablet by mouth daily as needed. For upset stomach, Disp: , Rfl: ;  cinacalcet (SENSIPAR) 30 MG tablet, Take 30 mg by mouth daily. , Disp: , Rfl: ;  colchicine 0.6 MG tablet, Take 0.3 mg by mouth 2 (two) times daily., Disp: , Rfl:  gabapentin (NEURONTIN) 100 MG capsule, Take 100 mg by mouth 2 (two) times daily., Disp: , Rfl: ;  omeprazole (PRILOSEC) 40 MG capsule, Take 40 mg by mouth daily as needed. For reflux, Disp: , Rfl: ;  OVER THE COUNTER MEDICATION, Apply 1 application topically daily as needed. For dry feet  otc healing balm, Disp: , Rfl: ;  polyethylene glycol powder (MIRALAX) powder, Take 17 g by mouth daily. , Disp: , Rfl:  sevelamer (RENAGEL) 800 MG tablet, Take 1,600-3,200 mg by mouth 4 (four) times daily. Patient takes 4 tablets with   meals and 2 tablets with snacks, Disp: , Rfl:   BP 158/63  Pulse 80  Resp 16  Ht 5' 8" (1.727 m)  Wt 181 lb 6.4 oz (82.283 kg)  BMI 27.58 kg/m2  Body mass index is 27.58 kg/(m^2).       Physical exam well-developed white male in no acute distress he does have pulsatile flow in the right arm AV fistula. He has multiple prior nonfunctional Gore-Tex in his left arm. He does not have any leg graft. He does have palpable femoral pulses and does have a 2+ right dorsalis pedis pulse.  Impression and plan: Incisional disease with need for new access. I have recommended a right femoral loop graft. I explained the procedure and an overnight stay in the hospital. I recommend that we proceed as soon as  possible to prevent need for central venous catheter. He is currently dialyzing successfully through the right upper arm fistula. Would recommend continued use of this and hopefully this would allow a 3 weeks of healing prior to initiation of use of his right femoral loop graft. Surgery is scheduled for 02/29/2012 

## 2012-02-28 ENCOUNTER — Encounter (HOSPITAL_COMMUNITY): Payer: Self-pay | Admitting: *Deleted

## 2012-02-28 MED ORDER — SODIUM CHLORIDE 0.9 % IV SOLN
INTRAVENOUS | Status: DC
  Administered 2012-02-29: 10:00:00 via INTRAVENOUS

## 2012-02-28 MED ORDER — CEFAZOLIN SODIUM 1-5 GM-% IV SOLN: 1.0000 g | Freq: Once | INTRAVENOUS | Status: DC

## 2012-02-29 ENCOUNTER — Ambulatory Visit (HOSPITAL_COMMUNITY): Payer: Medicare Other

## 2012-02-29 ENCOUNTER — Encounter (HOSPITAL_COMMUNITY): Payer: Self-pay | Admitting: Critical Care Medicine

## 2012-02-29 ENCOUNTER — Encounter (HOSPITAL_COMMUNITY)
Admission: RE | Disposition: A | Discharge status: Home / Self Care | Payer: Self-pay | Source: Ambulatory Visit | Attending: Vascular Surgery

## 2012-02-29 ENCOUNTER — Encounter (HOSPITAL_COMMUNITY): Payer: Self-pay | Admitting: *Deleted

## 2012-02-29 ENCOUNTER — Inpatient Hospital Stay (HOSPITAL_COMMUNITY)
Admission: RE | Admit: 2012-02-29 | Discharge: 2012-03-01 | DRG: 264 | Disposition: A | Discharge status: Home / Self Care | Payer: Medicare Other | Source: Ambulatory Visit | Attending: Vascular Surgery | Admitting: Vascular Surgery

## 2012-02-29 ENCOUNTER — Ambulatory Visit (HOSPITAL_COMMUNITY): Payer: Medicare Other | Admitting: Critical Care Medicine

## 2012-02-29 DIAGNOSIS — H9190 Unspecified hearing loss, unspecified ear: Secondary | ICD-10-CM | POA: Diagnosis present

## 2012-02-29 DIAGNOSIS — N2581 Secondary hyperparathyroidism of renal origin: Secondary | ICD-10-CM | POA: Diagnosis present

## 2012-02-29 DIAGNOSIS — M1990 Unspecified osteoarthritis, unspecified site: Secondary | ICD-10-CM | POA: Diagnosis present

## 2012-02-29 DIAGNOSIS — G5790 Unspecified mononeuropathy of unspecified lower limb: Secondary | ICD-10-CM | POA: Diagnosis present

## 2012-02-29 DIAGNOSIS — Y832 Surgical operation with anastomosis, bypass or graft as the cause of abnormal reaction of the patient, or of later complication, without mention of misadventure at the time of the procedure: Secondary | ICD-10-CM | POA: Diagnosis present

## 2012-02-29 DIAGNOSIS — M109 Gout, unspecified: Secondary | ICD-10-CM | POA: Diagnosis present

## 2012-02-29 DIAGNOSIS — E785 Hyperlipidemia, unspecified: Secondary | ICD-10-CM | POA: Diagnosis present

## 2012-02-29 DIAGNOSIS — I739 Peripheral vascular disease, unspecified: Secondary | ICD-10-CM | POA: Diagnosis present

## 2012-02-29 DIAGNOSIS — K219 Gastro-esophageal reflux disease without esophagitis: Secondary | ICD-10-CM | POA: Diagnosis present

## 2012-02-29 DIAGNOSIS — N186 End stage renal disease: Secondary | ICD-10-CM | POA: Diagnosis present

## 2012-02-29 DIAGNOSIS — Z8551 Personal history of malignant neoplasm of bladder: Secondary | ICD-10-CM

## 2012-02-29 DIAGNOSIS — T82898A Other specified complication of vascular prosthetic devices, implants and grafts, initial encounter: Principal | ICD-10-CM | POA: Diagnosis present

## 2012-02-29 DIAGNOSIS — Z7982 Long term (current) use of aspirin: Secondary | ICD-10-CM

## 2012-02-29 DIAGNOSIS — Z951 Presence of aortocoronary bypass graft: Secondary | ICD-10-CM

## 2012-02-29 DIAGNOSIS — Z85528 Personal history of other malignant neoplasm of kidney: Secondary | ICD-10-CM

## 2012-02-29 DIAGNOSIS — Z87891 Personal history of nicotine dependence: Secondary | ICD-10-CM

## 2012-02-29 DIAGNOSIS — Z79899 Other long term (current) drug therapy: Secondary | ICD-10-CM

## 2012-02-29 DIAGNOSIS — I120 Hypertensive chronic kidney disease with stage 5 chronic kidney disease or end stage renal disease: Secondary | ICD-10-CM | POA: Diagnosis present

## 2012-02-29 DIAGNOSIS — Z992 Dependence on renal dialysis: Secondary | ICD-10-CM

## 2012-02-29 DIAGNOSIS — I2510 Atherosclerotic heart disease of native coronary artery without angina pectoris: Secondary | ICD-10-CM | POA: Diagnosis present

## 2012-02-29 DIAGNOSIS — D649 Anemia, unspecified: Secondary | ICD-10-CM | POA: Diagnosis present

## 2012-02-29 HISTORY — PX: AV FISTULA PLACEMENT: SHX1204

## 2012-02-29 LAB — SURGICAL PCR SCREEN: MRSA, PCR: NEGATIVE

## 2012-02-29 LAB — POCT I-STAT 4, (NA,K, GLUC, HGB,HCT)
Glucose, Bld: 84 mg/dL (ref 70–99)
HCT: 35 % — ABNORMAL LOW (ref 39.0–52.0)

## 2012-02-29 SURGERY — INSERTION OF ARTERIOVENOUS (AV) GORE-TEX GRAFT THIGH
Anesthesia: General | Site: Thigh | Laterality: Right | Wound class: Clean

## 2012-02-29 MED ORDER — MIDAZOLAM HCL 5 MG/5ML IJ SOLN
INTRAMUSCULAR | Status: DC | PRN
  Administered 2012-02-29: 1 mg via INTRAVENOUS

## 2012-02-29 MED ORDER — COLCHICINE 0.6 MG PO TABS
0.3000 mg | ORAL_TABLET | Freq: Every day | ORAL | Status: DC | PRN
  Filled 2012-02-29: qty 0.5

## 2012-02-29 MED ORDER — LIDOCAINE HCL (CARDIAC) 20 MG/ML IV SOLN
INTRAVENOUS | Status: DC | PRN
  Administered 2012-02-29: 100 mg via INTRAVENOUS

## 2012-02-29 MED ORDER — OXYCODONE-ACETAMINOPHEN 5-325 MG PO TABS
1.0000 | ORAL_TABLET | ORAL | Status: DC | PRN
  Administered 2012-03-01: 1 via ORAL
  Filled 2012-02-29: qty 1

## 2012-02-29 MED ORDER — PANTOPRAZOLE SODIUM 40 MG PO TBEC
80.0000 mg | DELAYED_RELEASE_TABLET | Freq: Every day | ORAL | Status: DC
  Administered 2012-02-29 – 2012-03-01 (×2): 80 mg via ORAL
  Filled 2012-02-29 (×2): qty 2

## 2012-02-29 MED ORDER — ACETAMINOPHEN 325 MG PO TABS
325.0000 mg | ORAL_TABLET | ORAL | Status: DC | PRN
  Administered 2012-03-01: 650 mg via ORAL

## 2012-02-29 MED ORDER — PHENYLEPHRINE HCL 10 MG/ML IJ SOLN
INTRAMUSCULAR | Status: DC | PRN
  Administered 2012-02-29 (×7): 40 ug via INTRAVENOUS
  Administered 2012-02-29: 80 ug via INTRAVENOUS
  Administered 2012-02-29: 40 ug via INTRAVENOUS

## 2012-02-29 MED ORDER — OXYCODONE-ACETAMINOPHEN 5-325 MG PO TABS: 1.0000 | ORAL_TABLET | Freq: Four times a day (QID) | ORAL | Status: DC | PRN

## 2012-02-29 MED ORDER — SODIUM CHLORIDE 0.9 % IJ SOLN
3.0000 mL | Freq: Two times a day (BID) | INTRAMUSCULAR | Status: DC
  Administered 2012-02-29 – 2012-03-01 (×3): 3 mL via INTRAVENOUS

## 2012-02-29 MED ORDER — ONDANSETRON HCL 4 MG/2ML IJ SOLN: 4.0000 mg | Freq: Once | INTRAMUSCULAR | Status: DC | PRN

## 2012-02-29 MED ORDER — HEPARIN SODIUM (PORCINE) 1000 UNIT/ML IJ SOLN
INTRAMUSCULAR | Status: DC | PRN
  Administered 2012-02-29: 5000 [IU] via INTRAVENOUS

## 2012-02-29 MED ORDER — DOCUSATE SODIUM 100 MG PO CAPS
100.0000 mg | ORAL_CAPSULE | Freq: Two times a day (BID) | ORAL | Status: DC
  Administered 2012-02-29 – 2012-03-01 (×3): 100 mg via ORAL
  Filled 2012-02-29 (×5): qty 1

## 2012-02-29 MED ORDER — POLYETHYLENE GLYCOL 3350 17 GM/SCOOP PO POWD
17.0000 g | Freq: Every day | ORAL | Status: DC
  Administered 2012-02-29 – 2012-03-01 (×2): 17 g via ORAL
  Filled 2012-02-29 (×2): qty 255

## 2012-02-29 MED ORDER — MORPHINE SULFATE 2 MG/ML IJ SOLN: 2.0000 mg | INTRAMUSCULAR | Status: DC | PRN

## 2012-02-29 MED ORDER — PROPOFOL 10 MG/ML IV EMUL
INTRAVENOUS | Status: DC | PRN
  Administered 2012-02-29: 120 mg via INTRAVENOUS

## 2012-02-29 MED ORDER — FAMOTIDINE IN NACL 20-0.9 MG/50ML-% IV SOLN
20.0000 mg | Freq: Two times a day (BID) | INTRAVENOUS | Status: DC
  Administered 2012-02-29 (×2): 20 mg via INTRAVENOUS
  Filled 2012-02-29 (×4): qty 50

## 2012-02-29 MED ORDER — MUPIROCIN 2 % EX OINT
TOPICAL_OINTMENT | Freq: Once | CUTANEOUS | Status: AC
  Administered 2012-02-29: 09:00:00 via NASAL

## 2012-02-29 MED ORDER — SODIUM CHLORIDE 0.9 % IV SOLN
INTRAVENOUS | Status: DC | PRN
  Administered 2012-02-29 (×2): via INTRAVENOUS

## 2012-02-29 MED ORDER — GUAIFENESIN-DM 100-10 MG/5ML PO SYRP
15.0000 mL | ORAL_SOLUTION | ORAL | Status: DC | PRN
  Filled 2012-02-29: qty 15

## 2012-02-29 MED ORDER — SENNOSIDES-DOCUSATE SODIUM 8.6-50 MG PO TABS
1.0000 | ORAL_TABLET | Freq: Every evening | ORAL | Status: DC | PRN
  Filled 2012-02-29: qty 1

## 2012-02-29 MED ORDER — HYDRALAZINE HCL 20 MG/ML IJ SOLN
10.0000 mg | INTRAMUSCULAR | Status: DC | PRN
  Filled 2012-02-29: qty 0.5

## 2012-02-29 MED ORDER — MUPIROCIN 2 % EX OINT
TOPICAL_OINTMENT | CUTANEOUS | Status: AC
  Filled 2012-02-29: qty 22

## 2012-02-29 MED ORDER — 0.9 % SODIUM CHLORIDE (POUR BTL) OPTIME
TOPICAL | Status: DC | PRN
  Administered 2012-02-29: 1000 mL

## 2012-02-29 MED ORDER — SODIUM CHLORIDE 0.9 % IJ SOLN: 3.0000 mL | INTRAMUSCULAR | Status: DC | PRN

## 2012-02-29 MED ORDER — CINACALCET HCL 30 MG PO TABS
30.0000 mg | ORAL_TABLET | Freq: Every day | ORAL | Status: DC
  Filled 2012-02-29 (×2): qty 1

## 2012-02-29 MED ORDER — SEVELAMER HCL 800 MG PO TABS: 1600.0000 mg | ORAL_TABLET | Freq: Four times a day (QID) | ORAL | Status: DC

## 2012-02-29 MED ORDER — ASPIRIN EC 81 MG PO TBEC
81.0000 mg | DELAYED_RELEASE_TABLET | Freq: Every day | ORAL | Status: DC
  Administered 2012-02-29 – 2012-03-01 (×2): 81 mg via ORAL
  Filled 2012-02-29 (×2): qty 1

## 2012-02-29 MED ORDER — GABAPENTIN 100 MG PO CAPS
100.0000 mg | ORAL_CAPSULE | Freq: Two times a day (BID) | ORAL | Status: DC
  Administered 2012-02-29 – 2012-03-01 (×2): 100 mg via ORAL
  Filled 2012-02-29 (×4): qty 1

## 2012-02-29 MED ORDER — LABETALOL HCL 5 MG/ML IV SOLN
10.0000 mg | INTRAVENOUS | Status: DC | PRN
  Filled 2012-02-29: qty 4

## 2012-02-29 MED ORDER — ASPIRIN 81 MG PO TABS: 81.0000 mg | ORAL_TABLET | Freq: Every day | ORAL | Status: DC

## 2012-02-29 MED ORDER — SEVELAMER HCL 800 MG PO TABS
3200.0000 mg | ORAL_TABLET | Freq: Three times a day (TID) | ORAL | Status: DC
  Administered 2012-02-29: 1600 mg via ORAL
  Administered 2012-03-01: 3200 mg via ORAL
  Filled 2012-02-29 (×5): qty 4

## 2012-02-29 MED ORDER — SODIUM CHLORIDE 0.9 % IV SOLN: 250.0000 mL | INTRAVENOUS | Status: DC | PRN

## 2012-02-29 MED ORDER — EPHEDRINE SULFATE 50 MG/ML IJ SOLN
INTRAMUSCULAR | Status: DC | PRN
  Administered 2012-02-29: 5 mg via INTRAVENOUS
  Administered 2012-02-29: 10 mg via INTRAVENOUS
  Administered 2012-02-29: 5 mg via INTRAVENOUS

## 2012-02-29 MED ORDER — CEFAZOLIN SODIUM-DEXTROSE 2-3 GM-% IV SOLR
2.0000 g | INTRAVENOUS | Status: AC
  Administered 2012-02-29: 2 g via INTRAVENOUS
  Filled 2012-02-29: qty 50

## 2012-02-29 MED ORDER — POTASSIUM CHLORIDE CRYS ER 20 MEQ PO TBCR: 20.0000 meq | EXTENDED_RELEASE_TABLET | Freq: Once | ORAL | Status: DC

## 2012-02-29 MED ORDER — ONDANSETRON HCL 4 MG/2ML IJ SOLN: 4.0000 mg | Freq: Four times a day (QID) | INTRAMUSCULAR | Status: DC | PRN

## 2012-02-29 MED ORDER — ONDANSETRON HCL 4 MG/2ML IJ SOLN
INTRAMUSCULAR | Status: DC | PRN
  Administered 2012-02-29: 4 mg via INTRAVENOUS

## 2012-02-29 MED ORDER — SODIUM CHLORIDE 0.9 % IR SOLN
Status: DC | PRN
  Administered 2012-02-29: 11:00:00

## 2012-02-29 MED ORDER — SEVELAMER HCL 800 MG PO TABS
1600.0000 mg | ORAL_TABLET | ORAL | Status: DC | PRN
  Administered 2012-02-29: 1600 mg via ORAL
  Filled 2012-02-29: qty 2

## 2012-02-29 MED ORDER — HYDROMORPHONE HCL PF 1 MG/ML IJ SOLN: 0.2500 mg | INTRAMUSCULAR | Status: DC | PRN

## 2012-02-29 MED ORDER — ACETAMINOPHEN 650 MG RE SUPP: 325.0000 mg | RECTAL | Status: DC | PRN

## 2012-02-29 MED ORDER — METOPROLOL TARTRATE 1 MG/ML IV SOLN
2.0000 mg | INTRAVENOUS | Status: DC | PRN
  Filled 2012-02-29: qty 5

## 2012-02-29 MED ORDER — ALLOPURINOL 100 MG PO TABS
100.0000 mg | ORAL_TABLET | Freq: Every day | ORAL | Status: DC
  Administered 2012-02-29 – 2012-03-01 (×2): 100 mg via ORAL
  Filled 2012-02-29 (×2): qty 1

## 2012-02-29 MED ORDER — PARICALCITOL 5 MCG/ML IV SOLN
3.0000 ug | INTRAVENOUS | Status: DC
  Administered 2012-03-01: 3 ug via INTRAVENOUS
  Filled 2012-02-29: qty 0.6

## 2012-02-29 MED ORDER — ATORVASTATIN CALCIUM 10 MG PO TABS
10.0000 mg | ORAL_TABLET | Freq: Every day | ORAL | Status: DC
  Filled 2012-02-29 (×2): qty 1

## 2012-02-29 MED ORDER — SENNA 8.6 MG PO TABS
1.0000 | ORAL_TABLET | Freq: Two times a day (BID) | ORAL | Status: DC
  Administered 2012-02-29 – 2012-03-01 (×2): 8.6 mg via ORAL
  Filled 2012-02-29 (×3): qty 1

## 2012-02-29 MED ORDER — COLCHICINE 0.6 MG PO TABS
0.3000 mg | ORAL_TABLET | Freq: Two times a day (BID) | ORAL | Status: DC
  Filled 2012-02-29: qty 0.5

## 2012-02-29 MED ORDER — FENTANYL CITRATE 0.05 MG/ML IJ SOLN
INTRAMUSCULAR | Status: DC | PRN
  Administered 2012-02-29: 50 ug via INTRAVENOUS
  Administered 2012-02-29 (×4): 25 ug via INTRAVENOUS

## 2012-02-29 MED ORDER — PROTAMINE SULFATE 10 MG/ML IV SOLN
INTRAVENOUS | Status: DC | PRN
  Administered 2012-02-29 (×5): 10 mg via INTRAVENOUS

## 2012-02-29 MED ORDER — PHENOL 1.4 % MT LIQD
1.0000 | OROMUCOSAL | Status: DC | PRN
  Filled 2012-02-29: qty 177

## 2012-02-29 SURGICAL SUPPLY — 47 items
APL SKNCLS STERI-STRIP NONHPOA (GAUZE/BANDAGES/DRESSINGS) ×1
BENZOIN TINCTURE PRP APPL 2/3 (GAUZE/BANDAGES/DRESSINGS) ×2 IMPLANT
CANISTER SUCTION 2500CC (MISCELLANEOUS) ×2 IMPLANT
CLIP LIGATING EXTRA MED SLVR (CLIP) ×2 IMPLANT
CLIP LIGATING EXTRA SM BLUE (MISCELLANEOUS) ×2 IMPLANT
CLOTH BEACON ORANGE TIMEOUT ST (SAFETY) ×2 IMPLANT
CLSR STERI-STRIP ANTIMIC 1/2X4 (GAUZE/BANDAGES/DRESSINGS) ×1 IMPLANT
COVER SURGICAL LIGHT HANDLE (MISCELLANEOUS) ×4 IMPLANT
DECANTER SPIKE VIAL GLASS SM (MISCELLANEOUS) IMPLANT
DRAPE INCISE IOBAN 66X45 STRL (DRAPES) ×2 IMPLANT
ELECT REM PT RETURN 9FT ADLT (ELECTROSURGICAL) ×2
ELECTRODE REM PT RTRN 9FT ADLT (ELECTROSURGICAL) ×1 IMPLANT
GEL ULTRASOUND 20GR AQUASONIC (MISCELLANEOUS) IMPLANT
GLOVE BIO SURGEON STRL SZ 6.5 (GLOVE) ×1 IMPLANT
GLOVE BIO SURGEON STRL SZ7 (GLOVE) ×1 IMPLANT
GLOVE BIOGEL PI IND STRL 6.5 (GLOVE) IMPLANT
GLOVE BIOGEL PI IND STRL 7.0 (GLOVE) IMPLANT
GLOVE BIOGEL PI IND STRL 7.5 (GLOVE) IMPLANT
GLOVE BIOGEL PI INDICATOR 6.5 (GLOVE) ×3
GLOVE BIOGEL PI INDICATOR 7.0 (GLOVE) ×1
GLOVE BIOGEL PI INDICATOR 7.5 (GLOVE) ×2
GLOVE SS BIOGEL STRL SZ 7.5 (GLOVE) ×1 IMPLANT
GLOVE SUPERSENSE BIOGEL SZ 7.5 (GLOVE) ×2
GLOVE SURG SS PI 6.5 STRL IVOR (GLOVE) ×1 IMPLANT
GLOVE SURG SS PI 7.5 STRL IVOR (GLOVE) ×1 IMPLANT
GOWN PREVENTION PLUS XLARGE (GOWN DISPOSABLE) ×1 IMPLANT
GOWN STRL NON-REIN LRG LVL3 (GOWN DISPOSABLE) ×7 IMPLANT
GRAFT GORETEX STRT 6X50 (Vascular Products) ×1 IMPLANT
KIT BASIN OR (CUSTOM PROCEDURE TRAY) ×2 IMPLANT
KIT ROOM TURNOVER OR (KITS) ×2 IMPLANT
NS IRRIG 1000ML POUR BTL (IV SOLUTION) ×2 IMPLANT
PACK CV ACCESS (CUSTOM PROCEDURE TRAY) ×2 IMPLANT
PAD ARMBOARD 7.5X6 YLW CONV (MISCELLANEOUS) ×4 IMPLANT
SPONGE GAUZE 4X4 12PLY (GAUZE/BANDAGES/DRESSINGS) ×2 IMPLANT
STRIP CLOSURE SKIN 1/2X4 (GAUZE/BANDAGES/DRESSINGS) ×2 IMPLANT
SUT PROLENE 5 0 C 1 24 (SUTURE) ×1 IMPLANT
SUT PROLENE 6 0 CC (SUTURE) ×4 IMPLANT
SUT SILK 2 0 FS (SUTURE) ×1 IMPLANT
SUT VIC AB 2-0 CT1 27 (SUTURE) ×2
SUT VIC AB 2-0 CT1 TAPERPNT 27 (SUTURE) ×1 IMPLANT
SUT VIC AB 3-0 SH 27 (SUTURE) ×4
SUT VIC AB 3-0 SH 27X BRD (SUTURE) ×2 IMPLANT
TAPE CLOTH SURG 4X10 WHT LF (GAUZE/BANDAGES/DRESSINGS) ×1 IMPLANT
TOWEL OR 17X24 6PK STRL BLUE (TOWEL DISPOSABLE) ×2 IMPLANT
TOWEL OR 17X26 10 PK STRL BLUE (TOWEL DISPOSABLE) ×2 IMPLANT
UNDERPAD 30X30 INCONTINENT (UNDERPADS AND DIAPERS) ×2 IMPLANT
WATER STERILE IRR 1000ML POUR (IV SOLUTION) ×2 IMPLANT

## 2012-02-29 NOTE — Interval H&P Note (Signed)
History and Physical Interval Note:  02/29/2012 12:00 PM  Andrew [name]  has presented today for surgery, with the diagnosis of End Stage Renal Disease  The various methods of treatment have been discussed with the patient and family. After consideration of risks, benefits and other options for treatment, the patient has consented to  Procedure(s) (LRB): INSERTION OF ARTERIOVENOUS (AV) GORE-TEX GRAFT THIGH (Right) as a surgical intervention .  The patients' history has been reviewed, patient examined, no change in status, stable for surgery.  I have reviewed the patients' chart and labs.  Questions were answered to the patient's satisfaction.     Hitesh Fouche

## 2012-02-29 NOTE — Progress Notes (Signed)
Subjective:   78 yo WM on hd at [facility] <MWF schedule admitted b y Dr. Arbie Cookey post op right Femoral Avgg insert. Noted ho right upper arm avf  placed  July 2010 and multiple interventions since that time. Recently, 02/19/12 interventional radiology fistulogram showing occlusion of central portion of the fistual with subclavian occlusion as well.He is having adequate dialysis but having swelling in his right arm . Now postop without cos in his room.  Objective Vital signs in last 24 hours: Filed Vitals:   02/29/12 1247 02/29/12 1259 02/29/12 1304 02/29/12 1323  BP: 109/43 116/55  126/52  Pulse: 88 87  68  Temp:   97.7 F (36.5 C) 97.4 F (36.3 C)  TempSrc:    Oral  Resp: 16 17  18   SpO2: 100% 100% 100% 100%   Weight change:   Intake/Output Summary (Last 24 hours) at 02/29/12 1336 Last data filed at 02/29/12 1216  Gross per 24 hour  Intake    550 ml  Output     50 ml  Net    500 ml   Labs: Basic Metabolic Panel:  Lab 02/29/12 9562  NA 137  K 5.0  CL --  CO2 --  GLUCOSE 84  BUN --  CREATININE --  CALCIUM --  ALB --  PHOS --   Liver Function Tests: No results found for this basename: AST:3,ALT:3,ALKPHOS:3,BILITOT:3,PROT:3,ALBUMIN:3 in the last 168 hours No results found for this basename: LIPASE:3,AMYLASE:3 in the last 168 hours No results found for this basename: AMMONIA:3 in the last 168 hours CBC:  Lab 02/29/12 0903  WBC --  NEUTROABS --  HGB 11.9*  HCT 35.0*  MCV --  PLT --   Cardiac Enzymes: No results found for this basename: CKTOTAL:5,CKMB:5,CKMBINDEX:5,TROPONINI:5 in the last 168 hours CBG: No results found for this basename: GLUCAP:5 in the last 168 hours  Iron Studies: No results found for this basename: IRON,TIBC,TRANSFERRIN,FERRITIN in the last 72 hours Studies/Results: Dg Chest 2 View  02/29/2012  *RADIOLOGY REPORT*  Clinical Data: Preop.  Follow-up possible CHF.  CHEST - 2 VIEW  Comparison: 09/17/2011  Findings: Prior CABG.  Heart  is upper limits normal in size.  Lungs are clear.  No effusions or edema.  No acute bony abnormality. Right vascular stent is noted in the subclavian region.  Surgical clips in the left axilla.  IMPRESSION: No active cardiopulmonary disease.  Original Report Authenticated By: Cyndie Chime, M.D.   Medications:    . DISCONTD: sodium chloride 10 mL/hr at 02/29/12 0959      . allopurinol  100 mg Oral Daily  . aspirin  81 mg Oral Daily  . atorvastatin  10 mg Oral Daily  .  ceFAZolin (ANCEF) IV  2 g Intravenous To 282  . cinacalcet  30 mg Oral Daily  . colchicine  0.3 mg Oral BID  . docusate sodium  100 mg Oral BID  . famotidine (PEPCID) IV  20 mg Intravenous Q12H  . gabapentin  100 mg Oral BID  . mupirocin ointment   Nasal Once  . pantoprazole  40 mg Oral Q1200  . polyethylene glycol powder  17 g Oral Daily  . potassium chloride  20-40 mEq Oral Once  . senna  1 tablet Oral BID  . sevelamer  1,600-3,200 mg Oral QID  . sodium chloride  3 mL Intravenous Q12H  . DISCONTD:  ceFAZolin (ANCEF) IV  1 g Intravenous Once   I  have reviewed scheduled and prn medications.  Physical Exam: General: alert, pleasant WM, Nad Heart: RRR , no rub or Murmur Lungs: CTA no rales or wheezing Abdomen: soft and nontender Extremities: Dialysis Access:  No pedal edema, positive bruits in right upper arm avf and right femoral avgg. Mild swelling right arm to hand.   Problem/Plan: 1.  SP new Right Femoral AVGG placement= Post op tx per Dr. Arbie Cookey 2. ESRD - Hd in AM , edw=80.5 kg , 4hrs 3. Anemia -  hgb 11.9/  epo at out patient. No aranesp in am hd/ no iron/ normally 16,800 epo q hd 4. Secondary hyperparathyroidism -  zemplar 3 mcg q hd and Renvela binders with food 5. HTN/volume - stable  No bp meds.  Lenny Pastel, PA-C [facility] Beeper [phone] 02/29/2012,1:36 PM  LOS: 0 days   Mr. Nou is admitted by Dr. Arbie Cookey following placement of R thigh AVG.  I saw Mr. Guimond yesterday at  [facility].  He has edematous R forearm (site of AVF) and had fistulagram last week which showed extensive venous occlusions including a long segment of the cephalic vein and the entire R subclavian vein.  It was not amenable to percutaneous intervention.  Mr. Aultman is scheduled for dialysis tomorrow.  K today is 5.  He should be able to go home after dialysis tomorrow if all goes well.  His daughter Jivan Symanski) is at bedside and will help him out at home (he lives alone).

## 2012-02-29 NOTE — Anesthesia Preprocedure Evaluation (Signed)
Anesthesia Evaluation  Patient identified by MRN, date of birth, ID band Patient awake    Reviewed: Allergy & Precautions, H&P , NPO status , Patient's Chart, lab work & pertinent test results  Airway       Dental   Pulmonary          Cardiovascular hypertension, + CAD, + CABG and + Peripheral Vascular Disease     Neuro/Psych  Neuromuscular disease    GI/Hepatic PUD, GERD-  Medicated,  Endo/Other    Renal/GU CRF, Dialysis and ESRFRenal disease     Musculoskeletal   Abdominal   Peds  Hematology   Anesthesia Other Findings   Reproductive/Obstetrics                           Anesthesia Physical Anesthesia Plan  ASA: III  Anesthesia Plan: General   Post-op Pain Management:    Induction: Intravenous  Airway Management Planned: Mask  Additional Equipment:   Intra-op Plan:   Post-operative Plan:   Informed Consent: I have reviewed the patients History and Physical, chart, labs and discussed the procedure including the risks, benefits and alternatives for the proposed anesthesia with the patient or authorized representative who has indicated his/her understanding and acceptance.     Plan Discussed with: CRNA, Anesthesiologist and Surgeon  Anesthesia Plan Comments:         Anesthesia Quick Evaluation

## 2012-02-29 NOTE — H&P (View-Only) (Signed)
Patient presents today for discussion of access for hemodialysis. Currently is being dialyzed via a right arm AV fistula initially placed in July of 2010. He has had multiple interventions on this since that time. Most recently he underwent fistulogram in interventional radiology on 02/19/2012. I reviewed these images. This reveals occlusion of the central portion of the fistula with subclavian occlusion as well. Despite this he is having adequate dialysis currently but is having a great deal of swelling in his right arm. He dialyzed successfully yesterday. He has had multiple prior left arm accesses with a forearm loop and upper arm graft. He is here to discuss other options.  Past Medical History  Diagnosis Date  . Secondary hyperparathyroidism   . Peripheral vascular disease   . Hypertension   . Hyperlipidemia   . Duodenal ulcer     remote  . Gout STABLE  . ESRD (end stage renal disease) NEPHROLOGIST-   DR [name]    Hemodialysis; Hx of bleeding from aneurysms on AVF  . Arteriosclerotic cardiovascular disease (ASCVD) 1998    CABG-1998; h/o CHF  . Cerebrovascular disease REMOTE --  PER CHART--    NO RESIDUAL  . Neuropathy of foot COLD FEET  . Degenerative joint disease   . Gastroesophageal reflux disease   . History of bladder cancer   . Impaired hearing BILATERAL     ONLY WEARS LEFT HEARING AID  . Blood transfusion   . Anemia   . Renal cell carcinoma 2001    s/p right nephrectomy-2001; subsequent ESRD    History  Substance Use Topics  . Smoking status: Former Smoker -- 1.0 packs/day for 25 years    Types: Cigarettes    Quit date: 02/26/1984  . Smokeless tobacco: Never Used   Comment: quit 1985  . Alcohol Use: No    Family History  Problem Relation Age of Onset  . Colon cancer Neg Hx   . Liver disease Neg Hx   . Heart disease Father     No Known Allergies  Current outpatient prescriptions:acetaminophen (TYLENOL) 500 MG tablet, Take 1,000 mg by mouth every 6 (six) hours  as needed. For pain/headache, Disp: , Rfl: ;  allopurinol (ZYLOPRIM) 100 MG tablet, Take 100 mg by mouth daily., Disp: , Rfl: ;  aspirin 81 MG tablet, Take 81 mg by mouth daily. , Disp: , Rfl: ;  atorvastatin (LIPITOR) 10 MG tablet, Take 10 mg by mouth daily. , Disp: , Rfl:  B Complex-C-Zn-Folic Acid (DIALYVITE 800/ZINC PO), Take 1 tablet by mouth daily. , Disp: , Rfl: ;  calcium carbonate (TUMS - DOSED IN MG ELEMENTAL CALCIUM) 500 MG chewable tablet, Chew 1 tablet by mouth daily as needed. For upset stomach, Disp: , Rfl: ;  cinacalcet (SENSIPAR) 30 MG tablet, Take 30 mg by mouth daily. , Disp: , Rfl: ;  colchicine 0.6 MG tablet, Take 0.3 mg by mouth 2 (two) times daily., Disp: , Rfl:  gabapentin (NEURONTIN) 100 MG capsule, Take 100 mg by mouth 2 (two) times daily., Disp: , Rfl: ;  omeprazole (PRILOSEC) 40 MG capsule, Take 40 mg by mouth daily as needed. For reflux, Disp: , Rfl: ;  OVER THE COUNTER MEDICATION, Apply 1 application topically daily as needed. For dry feet  otc healing balm, Disp: , Rfl: ;  polyethylene glycol powder (MIRALAX) powder, Take 17 g by mouth daily. , Disp: , Rfl:  sevelamer (RENAGEL) 800 MG tablet, Take 1,600-3,200 mg by mouth 4 (four) times daily. Patient takes 4 tablets with  meals and 2 tablets with snacks, Disp: , Rfl:   BP 158/63  Pulse 80  Resp 16  Ht 5\' 8"  (1.727 m)  Wt 181 lb 6.4 oz (82.283 kg)  BMI 27.58 kg/m2  Body mass index is 27.58 kg/(m^2).       Physical exam well-developed white male in no acute distress he does have pulsatile flow in the right arm AV fistula. He has multiple prior nonfunctional Gore-Tex in his left arm. He does not have any leg graft. He does have palpable femoral pulses and does have a 2+ right dorsalis pedis pulse.  Impression and plan: Incisional disease with need for new access. I have recommended a right femoral loop graft. I explained the procedure and an overnight stay in the hospital. I recommend that we proceed as soon as  possible to prevent need for central venous catheter. He is currently dialyzing successfully through the right upper arm fistula. Would recommend continued use of this and hopefully this would allow a 3 weeks of healing prior to initiation of use of his right femoral loop graft. Surgery is scheduled for 02/29/2012

## 2012-02-29 NOTE — Anesthesia Postprocedure Evaluation (Signed)
  Anesthesia Post-op Note  Patient: Andrew [name]  Procedure(s) Performed: Procedure(s) (LRB): INSERTION OF ARTERIOVENOUS (AV) GORE-TEX GRAFT THIGH (Right)  Patient Location: PACU  Anesthesia Type: General  Level of Consciousness: awake, alert , oriented and patient cooperative  Airway and Oxygen Therapy: Patient Spontanous Breathing and Patient connected to nasal cannula oxygen  Post-op Pain: mild  Post-op Assessment: Post-op Vital signs reviewed, Patient's Cardiovascular Status Stable, Respiratory Function Stable, Patent Airway, No signs of Nausea or vomiting and Pain level controlled  Post-op Vital Signs: stable  Complications: No apparent anesthesia complications

## 2012-02-29 NOTE — Progress Notes (Signed)
1231 Late entry. Checklist completed.  Patient interviewed in holding area by Doreene Adas, RN.

## 2012-02-29 NOTE — Progress Notes (Addendum)
Pt had an unwitnessed fall. Pt was on a bed alarm, but his daughter asked that it be turned off so he could walk in the hallway. I agreed, and took the pt off the bed alarm. I walked out into the hallway, and a few minutes later, I heard the daughter yell, "Oh dad!". I ran back to the room, and the pt was in the bathroom, on the floor. When I asked him what happened, he said he was sitting on the toilet, and leaned forward too far, and fell on his hands and knees. He did not hit his head. I assessed the patient, and he has a small skin tear on his right knee. His left knee is unremarkable. Right leg graft is still ++, with no signs of damage from the incident. Dressing is still dry and intact. Pt is not c/o any pain. VSS. Neuro check unremarkable. ROM in right knee WNL. After the incident, I reinforced safety precautions with the pt. Pt was put back on bed alarm, and instructed again to call before getting up. Prior to the fall I had discussed safety precautions with pt, informing him that he has to use the call bell before getting out of bed, and that the bed alarm must be on for the first 24 hours of his hospital stay. Yellow bracelet was not on pt at time of fall, and was placed on his wrist afterwards. Pt will also be moved to a camera room for safety. Daughter also educated about safety. Pt reacted to the fall saying "Oh I'm fine, y'all don't have to do all this." Safety huddle held at bedside.  Dr. Arbie Cookey was paged immediately after the fall, but was not reached until 1900. At this time, Dr. Arbie Cookey gave no instructions, as pt is stable and is showing no problems sustained from the fall.

## 2012-02-29 NOTE — OR Nursing (Signed)
1210 Patients hearing aids in silver box in labeled plastic bag and taken to PACU with patient by Doreene Adas, RN.

## 2012-02-29 NOTE — Op Note (Signed)
OPERATIVE REPORT  DATE OF SURGERY: 02/29/2012  PATIENT: Andrew [name], 77 y.o. male MRN: [id number]  DOB: 14-Nov-1926  PRE-OPERATIVE DIAGNOSIS: End-stage renal disease  POST-OPERATIVE DIAGNOSIS:  Same  PROCEDURE: Right femoral AV Gore-Tex graft  SURGEON:  Gretta Began, M.D.  PHYSICIAN ASSISTANT: Collins  ANESTHESIA:  Gen.  EBL: 50 ml  Total I/O In: 550 [I.V.:550] Out: 50 [Blood:50]  BLOOD ADMINISTERED: None  DRAINS: None  SPECIMEN: None  COUNTS CORRECT:  YES  PLAN OF CARE: PACU   PATIENT DISPOSITION:  PACU - hemodynamically stable  PROCEDURE DETAILS: The patient was taken to the operating room and placed in supine position where the area of the right groin and right thigh were prepped and draped in the usual sterile fashion. An incision was made above the inguinal crease over the level of the femoral pulse and carried down to isolate the common femoral artery which had moderate posterior plaque. The common femoral vein was identified at the saphenofemoral junction. A separate incision was made over the distal thigh in a loop configuration tunnel was created. A 6 mm standard stretch Gore-Tex graft was brought through the tunnel. The common femoral vein was occluded with a partial occlusion clamp just above the saphenofemoral junction. The vein was opened with the 11 blade and extended longitudinally with Potts scissors. The graft was spatulated and sewn end to side to the vein with a running 6-0 Prolene suture. Clamps were then removed from the vein and the graft was flushed with heparinized saline and reoccluded. Next the common femoral artery was occluded proximally and distally and opened with an 11 blade and extended longitudinally with Potts scissors. A small arteriotomy was created. The graft was cut to appropriate length and sewn end-to-side to the common femoral artery with a running 5-0 Prolene suture. Prior to completion of the anastomosis the usual flushing maneuvers were  undertaken anastomosis was then completed. Excellent thrill was noted. The patient was given 5000 units of intravenous heparin before occluding the femoral artery and was given 50 mg of protamine after completion anastomosis. The wound irrigated with saline. Hemostasis was obtained with electrocautery. The wounds were closed with 2-0 Vicryl in several layers in the groin and the skin was closed with 3-0 subcuticular Vicryl stitch   Gretta Began, M.D. 02/29/2012 12:48 PM

## 2012-02-29 NOTE — Transfer of Care (Signed)
Immediate Anesthesia Transfer of Care Note  Patient: Andrew [name]  Procedure(s) Performed: Procedure(s) (LRB): INSERTION OF ARTERIOVENOUS (AV) GORE-TEX GRAFT THIGH (Right)  Patient Location: PACU  Anesthesia Type: General  Level of Consciousness: awake and alert   Airway & Oxygen Therapy: Patient Spontanous Breathing and Patient connected to nasal cannula oxygen  Post-op Assessment: Report given to PACU RN, Post -op Vital signs reviewed and stable and Patient moving all extremities X 4  Post vital signs: Reviewed and stable  Complications: No apparent anesthesia complications

## 2012-02-29 NOTE — Progress Notes (Signed)
Pt arrived to floor from PACU. Pt alert and oriented. VSS. No c/o pain. Right thigh dressing dry and intact. Graft ++. Right foot cool to touch, feet are slightly numb. Lenny Pastel, PA aware. Will continue to monitor. Oriented to room. Call bell within reach. Bed in low position. Bed alarm on.

## 2012-02-29 NOTE — Preoperative (Signed)
Beta Blockers   Reason not to administer Beta Blockers:Not Applicable 

## 2012-03-01 ENCOUNTER — Emergency Department (HOSPITAL_COMMUNITY): Payer: Medicare Other

## 2012-03-01 ENCOUNTER — Other Ambulatory Visit: Payer: Self-pay

## 2012-03-01 ENCOUNTER — Encounter (HOSPITAL_COMMUNITY): Payer: Self-pay | Admitting: Vascular Surgery

## 2012-03-01 ENCOUNTER — Observation Stay (HOSPITAL_COMMUNITY)
Admission: EM | Admit: 2012-03-01 | Discharge: 2012-03-03 | Disposition: A | Discharge status: Home / Self Care | Payer: Medicare Other | Attending: Internal Medicine | Admitting: Internal Medicine

## 2012-03-01 DIAGNOSIS — D649 Anemia, unspecified: Secondary | ICD-10-CM

## 2012-03-01 DIAGNOSIS — F29 Unspecified psychosis not due to a substance or known physiological condition: Principal | ICD-10-CM | POA: Insufficient documentation

## 2012-03-01 DIAGNOSIS — E213 Hyperparathyroidism, unspecified: Secondary | ICD-10-CM | POA: Insufficient documentation

## 2012-03-01 DIAGNOSIS — Z951 Presence of aortocoronary bypass graft: Secondary | ICD-10-CM | POA: Insufficient documentation

## 2012-03-01 DIAGNOSIS — N186 End stage renal disease: Secondary | ICD-10-CM

## 2012-03-01 DIAGNOSIS — I2510 Atherosclerotic heart disease of native coronary artery without angina pectoris: Secondary | ICD-10-CM

## 2012-03-01 DIAGNOSIS — R269 Unspecified abnormalities of gait and mobility: Secondary | ICD-10-CM

## 2012-03-01 DIAGNOSIS — Z8551 Personal history of malignant neoplasm of bladder: Secondary | ICD-10-CM

## 2012-03-01 DIAGNOSIS — R5381 Other malaise: Secondary | ICD-10-CM | POA: Insufficient documentation

## 2012-03-01 DIAGNOSIS — G629 Polyneuropathy, unspecified: Secondary | ICD-10-CM

## 2012-03-01 DIAGNOSIS — D630 Anemia in neoplastic disease: Secondary | ICD-10-CM | POA: Diagnosis present

## 2012-03-01 DIAGNOSIS — Z8711 Personal history of peptic ulcer disease: Secondary | ICD-10-CM

## 2012-03-01 DIAGNOSIS — Z992 Dependence on renal dialysis: Secondary | ICD-10-CM | POA: Insufficient documentation

## 2012-03-01 DIAGNOSIS — R410 Disorientation, unspecified: Secondary | ICD-10-CM

## 2012-03-01 DIAGNOSIS — R531 Weakness: Secondary | ICD-10-CM

## 2012-03-01 DIAGNOSIS — N2581 Secondary hyperparathyroidism of renal origin: Secondary | ICD-10-CM

## 2012-03-01 DIAGNOSIS — I10 Essential (primary) hypertension: Secondary | ICD-10-CM

## 2012-03-01 DIAGNOSIS — E785 Hyperlipidemia, unspecified: Secondary | ICD-10-CM

## 2012-03-01 LAB — BASIC METABOLIC PANEL
BUN: 16 mg/dL (ref 6–23)
CO2: 29 mEq/L (ref 19–32)
CO2: 30 mEq/L (ref 19–32)
Calcium: 9 mg/dL (ref 8.4–10.5)
Chloride: 96 mEq/L (ref 96–112)
Creatinine, Ser: 8.12 mg/dL — ABNORMAL HIGH (ref 0.50–1.35)
Creatinine, Ser: 4.59 mg/dL — ABNORMAL HIGH (ref 0.50–1.35)
Glucose, Bld: 107 mg/dL — ABNORMAL HIGH (ref 70–99)

## 2012-03-01 LAB — CBC
HCT: 33.9 % — ABNORMAL LOW (ref 39.0–52.0)
Hemoglobin: 9.6 g/dL — ABNORMAL LOW (ref 13.0–17.0)
Hemoglobin: 10.5 g/dL — ABNORMAL LOW (ref 13.0–17.0)
MCH: 32.3 pg (ref 26.0–34.0)
MCV: 106.4 fL — ABNORMAL HIGH (ref 78.0–100.0)
MCV: 108.3 fL — ABNORMAL HIGH (ref 78.0–100.0)
RBC: 2.97 MIL/uL — ABNORMAL LOW (ref 4.22–5.81)
RDW: 17.4 % — ABNORMAL HIGH (ref 11.5–15.5)
WBC: 5.2 10*3/uL (ref 4.0–10.5)

## 2012-03-01 LAB — HEPATIC FUNCTION PANEL
Alkaline Phosphatase: 103 U/L (ref 39–117)
Indirect Bilirubin: 0.4 mg/dL (ref 0.3–0.9)
Total Bilirubin: 0.6 mg/dL (ref 0.3–1.2)
Total Protein: 6.4 g/dL (ref 6.0–8.3)

## 2012-03-01 LAB — DIFFERENTIAL
Basophils Absolute: 0 10*3/uL (ref 0.0–0.1)
Eosinophils Relative: 4 % (ref 0–5)
Lymphocytes Relative: 14 % (ref 12–46)
Lymphs Abs: 0.8 10*3/uL (ref 0.7–4.0)
Monocytes Absolute: 0.5 10*3/uL (ref 0.1–1.0)
Monocytes Relative: 9 % (ref 3–12)

## 2012-03-01 LAB — CARDIAC PANEL(CRET KIN+CKTOT+MB+TROPI): Total CK: 70 U/L (ref 7–232)

## 2012-03-01 LAB — GLUCOSE, CAPILLARY: Glucose-Capillary: 95 mg/dL (ref 70–99)

## 2012-03-01 MED ORDER — PARICALCITOL 5 MCG/ML IV SOLN
INTRAVENOUS | Status: AC
  Filled 2012-03-01: qty 1

## 2012-03-01 MED ORDER — HALOPERIDOL LACTATE 5 MG/ML IJ SOLN: 1.0000 mg | Freq: Once | INTRAMUSCULAR | Status: DC

## 2012-03-01 MED ORDER — ACETAMINOPHEN 325 MG PO TABS
ORAL_TABLET | ORAL | Status: AC
  Filled 2012-03-01: qty 2

## 2012-03-01 MED ORDER — FAMOTIDINE 20 MG PO TABS
20.0000 mg | ORAL_TABLET | Freq: Two times a day (BID) | ORAL | Status: DC
  Filled 2012-03-01 (×2): qty 1

## 2012-03-01 NOTE — Progress Notes (Signed)
Actual weight

## 2012-03-01 NOTE — Progress Notes (Signed)
AVS reviewed with pt and daughter at bedside. Pt and daughter able to verbalize AVS info with no questions. Pt remains stable, just very tired. RX for pain med given. Pt transported by nurse tech via wheelchair down to short stay. [place], Rosanna Randy

## 2012-03-01 NOTE — ED Notes (Signed)
Family requesting admission d/t pt unable to walk byself. Pt resting in bed with nad noted. Family at bsd. cb at side. Will continue to monitor.

## 2012-03-01 NOTE — H&P (Signed)
PCP:   [name]., MD, MD   Chief Complaint:  Confusion and difficulty walking since this morning.  HPI: Andrew [name] is an 78 y.o. male.   End-stage renal disease dialysis dependent, admitted overnight at [facility] for placement of a new right thigh fistula, since his right arm fistula has begun to malfunction. This was of course done under general anesthesia.  Patient was discharged from [facility] this afternoon, after dialysis, and at discharge was noted to be unsteady on his feet and a little confused. He usually lives by himself but his daughter said she would be staying with him and she felt she could manage him so she took him home.  This evening she noted that he was much more confused and was completely unable to walk, so she called an ambulance and he was brought to an emergency room.  There is no history of fever cough or cold chest pains or shortness of breath. Has no prior history of sundowning, though he does have remote history of delirium related to sepsis.  In the emergency room he received 700 cc of IV fluid and his daughter says he seems to be much better although still confused. He is complaining of hunger and thirst.  Patient reports his dry weight is 81 kg;   Rewiew of Systems:  The patient denies anorexia, fever, weight loss,, vision loss, decreased hearing, hoarseness, chest pain, syncope, dyspnea on exertion, peripheral edema, balance deficits, hemoptysis, abdominal pain, melena, hematochezia, severe indigestion/heartburn, hematuria, incontinence, genital sores, muscle weakness, suspicious skin lesions, transient blindness,  depression, unusual weight change, abnormal bleeding, enlarged lymph nodes, angioedema, and breast masses.    Past Medical History  Diagnosis Date  . Secondary hyperparathyroidism   . Peripheral vascular disease   . Hyperlipidemia   . Duodenal ulcer     remote  . Gout STABLE  . ESRD (end stage renal disease) NEPHROLOGIST-    DR [name]    Hemodialysis; Hx of bleeding from aneurysms on AVF  . Arteriosclerotic cardiovascular disease (ASCVD) 1998    CABG-1998; h/o CHF  . Cerebrovascular disease REMOTE --  PER CHART--    NO RESIDUAL  . Neuropathy of foot COLD FEET  . Degenerative joint disease   . Gastroesophageal reflux disease   . History of bladder cancer   . Impaired hearing BILATERAL     ONLY WEARS LEFT HEARING AID  . Blood transfusion   . Anemia   . Renal cell carcinoma 2001    s/p right nephrectomy-2001; subsequent ESRD  . Hypertension     Past Surgical History  Procedure Date  . Right nephrectomy 2001    Renal cell carcinoma  . Colonoscopy 01/24/2011    prominent vascular pattern, suboptimal prep but doable  . Esophagogastroduodenoscopy 01/24/2011    mild erosive reflux esophagitis, bulbar/antral erosions, bx from antrum benign  . Av fistula repair 2008    revision of anastomosis of Right AVF  . Arteriovenous graft placement 2006    Thrombectomy and interposition jump graft revision to higher axillary vein of LUA AVG  . Coronary artery bypass graft 1998    X4 VESSELS  . Multiple cysto/ resection tumor's with bx's LAST ONE 05-09-2011  . Multiple surg's / interventions for avgg/ fistula right upper arm LAST REVISION 06-29-2011    (JUNE 2010 STENT CEPHALIC VEIN/ 03-23-2011 DILATATION ANGIOPLASTY)  . Left ptc left renal artery   . Cardiac catheterization 2004  . Cataract extraction w/ intraocular lens  implant, bilateral   .  Cystoscopy w/ retrogrades 12/28/2011    Procedure: CYSTOSCOPY WITH RETROGRADE PYELOGRAM;  Surgeon: [name], MD;  Location: [facility];  Service: Urology;  Laterality: Left;  C-ARM   . Transurethral resection of bladder tumor 12/28/2011    Procedure: TRANSURETHRAL RESECTION OF BLADDER TUMOR (TURBT);  Surgeon: [name], MD;  Location: [facility];  Service: Urology;  Laterality: N/A;  . Av fistula placement 02/29/2012    Procedure: INSERTION  OF ARTERIOVENOUS (AV) GORE-TEX GRAFT THIGH;  Surgeon: [name], MD;  Location: [facility] OR;  Service: Vascular;  Laterality: Right;  Insertion right femoral arteriovenous gortex graft    Medications:  HOME MEDS: Prior to Admission medications   Medication Sig Start Date End Date Taking? Authorizing Provider  allopurinol (ZYLOPRIM) 100 MG tablet Take 200 mg by mouth daily.  09/19/11  Yes Rhetta Mura, MD  aspirin EC 81 MG tablet Take 81 mg by mouth daily.   Yes Historical Provider, MD  atorvastatin (LIPITOR) 10 MG tablet Take 10 mg by mouth daily.    Yes Historical Provider, MD  B Complex-C-Zn-Folic Acid (DIALYVITE 800/ZINC PO) Take 1 tablet by mouth daily.    Yes Historical Provider, MD  cinacalcet (SENSIPAR) 30 MG tablet Take 30 mg by mouth daily.    Yes Historical Provider, MD  colchicine (COLCRYS) 0.6 MG tablet Take 0.6 mg by mouth 2 (two) times daily. Prn for gout   Yes Historical Provider, MD  docusate sodium (COLACE) 100 MG capsule Take 100 mg by mouth 2 (two) times daily.   Yes Historical Provider, MD  gabapentin (NEURONTIN) 100 MG capsule Take 100 mg by mouth 2 (two) times daily. 01/09/12 01/08/13 Yes Kathlen Brunswick, MD  omeprazole (PRILOSEC) 40 MG capsule Take 40 mg by mouth at bedtime as needed. For reflux   Yes Historical Provider, MD  OVER THE COUNTER MEDICATION Apply 1 application topically daily as needed. For dry feet  otc healing balm   Yes Historical Provider, MD  polyethylene glycol powder (MIRALAX) powder Take 17 g by mouth daily.    Yes Historical Provider, MD  sevelamer (RENAGEL) 800 MG tablet Take 1,600-3,200 mg by mouth 4 (four) times daily. Patient takes 4 tablets with meals and 2 tablets with snacks   Yes Historical Provider, MD  acetaminophen (TYLENOL) 500 MG tablet Take 1,000 mg by mouth every 6 (six) hours as needed. For pain/headache    Historical Provider, MD  calcium carbonate (TUMS - DOSED IN MG ELEMENTAL CALCIUM) 500 MG chewable tablet Chew 1 tablet by  mouth daily as needed. For upset stomach    Historical Provider, MD  oxyCODONE-acetaminophen (ROXICET) 5-325 MG per tablet Take 1 tablet by mouth every 6 (six) hours as needed for pain. 02/29/12 03/10/12  Lars Mage, PA     Allergies:  No Known Allergies  Social History:   reports that he quit smoking about 28 years ago. His smoking use included Cigarettes. He has a 25 pack-year smoking history. He has never used smokeless tobacco. He reports that he does not drink alcohol or use illicit drugs.  Family History: Family History  Problem Relation Age of Onset  . Colon cancer Neg Hx   . Liver disease Neg Hx   . Heart disease Father      Physical Exam: Filed Vitals:   03/01/12 2141 03/01/12 2316 03/01/12 2321 03/01/12 2324  BP: 110/46   144/70  Pulse: 104   114  Temp:      TempSrc:  Resp: 18   20  Height:      Weight:  91.4 kg (201 lb 8 oz) 80.105 kg (176 lb 9.6 oz)   SpO2: 96%   98%   Blood pressure 144/70, pulse 114, temperature 99.7 F (37.6 C), temperature source Oral, resp. rate 20, height 5\' 8"  (1.727 m), weight 80.105 kg (176 lb 9.6 oz), SpO2 98.00%.  GEN:  Pleasant but confused elderly Caucasian man lying in the stretcher in no acute distress; cooperative with exam PSYCH:  alert and oriented x3; he also admits that he realizes his thinking is not quite right. HEENT: Mucous membranes pink and anicteric; PERRLA; EOM intact; no cervical lymphadenopathy nor thyromegaly or carotid bruit; no JVD; Breasts:: Not examined CHEST WALL: No tenderness CHEST: Normal respiration, clear to auscultation bilaterally HEART: Regular rhythm; tachycardic;  3/6 systolic murmur BACK: Mild kyphosis kyphosis or scoliosis; no CVA tenderness ABDOMEN: Obese, soft non-tender; no masses, no organomegaly, normal abdominal bowel sounds; no pannus; no intertriginous candida. Rectal Exam: Not done EXTREMITIES: Right arm fistula with bruit; extensive bruising of the right upper thigh; bandage is  over the entry points of the femoral artery; no bleeding; present; no lower extremity edema. no ulcerations. Genitalia: not examined PULSES: 1+ and symmetric; capapillary refill good bilateral SKIN: Normal hydration no rash or ulceration CNS: Cranial nerves 2-12 grossly intact no focal lateralizing neurologic deficit   Labs & Imaging Results for orders placed during the hospital encounter of 03/01/12 (from the past 48 hour(s))  CBC     Status: Abnormal   Collection Time   03/01/12  8:16 PM      Component Value Range Comment   WBC 5.2  4.0 - 10.5 (K/uL)    RBC 3.13 (*) 4.22 - 5.81 (MIL/uL)    Hemoglobin 10.5 (*) 13.0 - 17.0 (g/dL)    HCT 16.1 (*) 09.6 - 52.0 (%)    MCV 108.3 (*) 78.0 - 100.0 (fL)    MCH 33.5  26.0 - 34.0 (pg)    MCHC 31.0  30.0 - 36.0 (g/dL)    RDW 04.5 (*) 40.9 - 15.5 (%)    Platelets 97 (*) 150 - 400 (K/uL)   DIFFERENTIAL     Status: Normal   Collection Time   03/01/12  8:16 PM      Component Value Range Comment   Neutrophils Relative 73  43 - 77 (%)    Neutro Abs 3.8  1.7 - 7.7 (K/uL)    Lymphocytes Relative 14  12 - 46 (%)    Lymphs Abs 0.8  0.7 - 4.0 (K/uL)    Monocytes Relative 9  3 - 12 (%)    Monocytes Absolute 0.5  0.1 - 1.0 (K/uL)    Eosinophils Relative 4  0 - 5 (%)    Eosinophils Absolute 0.2  0.0 - 0.7 (K/uL)    Basophils Relative 0  0 - 1 (%)    Basophils Absolute 0.0  0.0 - 0.1 (K/uL)   BASIC METABOLIC PANEL     Status: Abnormal   Collection Time   03/01/12  8:16 PM      Component Value Range Comment   Sodium 138  135 - 145 (mEq/L)    Potassium 5.4 (*) 3.5 - 5.1 (mEq/L)    Chloride 96  96 - 112 (mEq/L)    CO2 30  19 - 32 (mEq/L)    Glucose, Bld 107 (*) 70 - 99 (mg/dL)    BUN 16  6 - 23 (mg/dL)  Creatinine, Ser 4.59 (*) 0.50 - 1.35 (mg/dL)    Calcium 9.0  8.4 - 10.5 (mg/dL)    GFR calc non Af Amer 11 (*) >90 (mL/min)    GFR calc Af Amer 12 (*) >90 (mL/min)   HEPATIC FUNCTION PANEL     Status: Abnormal   Collection Time   03/01/12  8:16 PM       Component Value Range Comment   Total Protein 6.4  6.0 - 8.3 (g/dL)    Albumin 3.2 (*) 3.5 - 5.2 (g/dL)    AST 20  0 - 37 (U/L)    ALT <5  0 - 53 (U/L)    Alkaline Phosphatase 103  39 - 117 (U/L)    Total Bilirubin 0.6  0.3 - 1.2 (mg/dL)    Bilirubin, Direct 0.2  0.0 - 0.3 (mg/dL)    Indirect Bilirubin 0.4  0.3 - 0.9 (mg/dL)   CARDIAC PANEL(CRET KIN+CKTOT+MB+TROPI)     Status: Normal   Collection Time   03/01/12  8:16 PM      Component Value Range Comment   Total CK 70  7 - 232 (U/L)    CK, MB 1.9  0.3 - 4.0 (ng/mL)    Troponin I <0.30  <0.30 (ng/mL)    Relative Index RELATIVE INDEX IS INVALID  0.0 - 2.5    RETICULOCYTES     Status: Abnormal   Collection Time   03/01/12 11:06 PM      Component Value Range Comment   Retic Ct Pct 1.9  0.4 - 3.1 (%)    RBC. 3.12 (*) 4.22 - 5.81 (MIL/uL)    Retic Count, Manual 59.3  19.0 - 186.0 (K/uL)    Dg Chest 2 View  02/29/2012  *RADIOLOGY REPORT*  Clinical Data: Preop.  Follow-up possible CHF.  CHEST - 2 VIEW  Comparison: 09/17/2011  Findings: Prior CABG.  Heart is upper limits normal in size.  Lungs are clear.  No effusions or edema.  No acute bony abnormality. Right vascular stent is noted in the subclavian region.  Surgical clips in the left axilla.  IMPRESSION: No active cardiopulmonary disease.  Original Report Authenticated By: Cyndie Chime, M.D.   Ct Head Wo Contrast  03/01/2012  *RADIOLOGY REPORT*  Clinical Data: Weakness.  Hypertension.  Prior CVA.  Peripheral vascular disease.  Bladder cancer.  CT HEAD WITHOUT CONTRAST  Technique:  Contiguous axial images were obtained from the base of the skull through the vertex without contrast.  Comparison: 06/12/2010  Findings: The brain stem, cerebellum, cerebral peduncles, thalami, basal ganglia, basilar cisterns, and ventricular system appear unremarkable.  Periventricular and corona radiata white matter hypodensities are most compatible with chronic ischemic microvascular white matter disease.  No  intracranial hemorrhage, mass lesion, or acute infarction is identified.  Minimal polypoid mucoperiosteal thickening noted in the maxillary sinuses.  Old right medial orbital wall fracture noted.  There is a small chronic erosion of the odontoid.  Atherosclerotic calcification of the carotid siphons noted.  IMPRESSION: 1.  Mild chronic paranasal sinusitis.  2. Periventricular and corona radiata white matter hypodensities are most compatible with chronic ischemic microvascular white matter disease.  3.  Atherosclerosis.  4.  Old right medial orbital wall fracture.  Original Report Authenticated By: Dellia Cloud, M.D.   Dg Chest Portable 1 View  03/01/2012  *RADIOLOGY REPORT*  Clinical Data: Weakness, history of CABG and end-stage renal disease  PORTABLE CHEST - 1 VIEW  Comparison: Chest x-ray of 02/29/2012  Findings: No  active infiltrate or effusion is seen.  Cardiomegaly is stable.  Median sternotomy sutures are noted.  IMPRESSION: Stable cardiomegaly.  No active lung disease.  Original Report Authenticated By: Juline Patch, M.D.      Assessment Present on Admission:  .Delirium .Gait abnormality .ANEMIA .Hyperlipidemia .Hypertension .ESRD (end stage renal disease) .Peripheral neuropathy .SECONDARY HYPERPARATHYROIDISM   PLAN: Acute delirium and weakness likely residua of anesthetic; but he also seems dehydrated and below his dry weight. We will not give any further IV fluids, but will allow him to drink as he feels the need. If he is not improved by morning, will consult nephrology, perhaps a vascular surgery.  Although he is tachycardic, he is not hypoxic and has no chest pain; this does not seem to be the picture of a pulmonary embolus.  Other plans as per orders.    Elisandra Deshmukh 03/01/2012, 11:45 PM

## 2012-03-01 NOTE — Progress Notes (Addendum)
VASCULAR AND VEIN SURGERY PROGRESS NOTE  POST-OP HEMODIALYSIS ACCESS  Date of Surgery: 02/29/2012 Surgeon: Surgeon(s): [name], MD 1 Day Post-Op Right INSERTION OF ARTERIOVENOUS (AV) GORE-TEX GRAFT THIGH   HPI: Andrew [name] is a 78 y.o. male who is 1 Day Post-Op creation/revision of right lower extremity Hemodialysis access. The patient denies symptoms of numbness, tingling, weakness; denies pain in the operative limb. Pt states he fell in BR last PM with no injury He will have his daughter at home with him  Significant Diagnostic Studies: CBC Lab Results  Component Value Date   WBC 4.7 09/19/2011   HGB 11.9* 02/29/2012   HCT 35.0* 02/29/2012   MCV 98.2 09/19/2011   PLT 84* 09/19/2011    BMET    Component Value Date/Time   NA 137 02/29/2012 0903   K 5.0 02/29/2012 0903   K 6.0 03/23/2011 0912   CL 96 09/19/2011 0442   CO2 30 09/19/2011 0442   GLUCOSE 84 02/29/2012 0903   BUN 55* 09/19/2011 0442   CREATININE 9.28* 09/19/2011 0442   CALCIUM 9.5 09/19/2011 0442   GFRNONAA 5* 09/19/2011 0442   GFRAA 5* 09/19/2011 0442    COAG Lab Results  Component Value Date   INR 1.2 05/04/2009   INR 1.1 12/10/2007   No results found for this basename: PTT    Vital Signs  BP Readings from Last 3 Encounters:  03/01/12 120/61  03/01/12 120/61  02/27/12 158/63   Temp Readings from Last 3 Encounters:  03/01/12 99.1 F (37.3 C) Oral  03/01/12 99.1 F (37.3 C) Oral  12/28/11 97 F (36.1 C)    SpO2 Readings from Last 3 Encounters:  03/01/12 95%  03/01/12 95%  12/28/11 100%   Pulse Readings from Last 3 Encounters:  03/01/12 97  03/01/12 97  02/27/12 80     Physical Examination  Patient Vitals for the past 24 hrs:  BP Temp Temp src Pulse Resp SpO2 Height Weight  03/01/12 0606 120/61 mmHg 99.1 F (37.3 C) Oral 97  18  95 % - -  02/29/12 2123 138/65 mmHg 97.9 F (36.6 C) Oral 90  18  97 % - -  02/29/12 1940 130/62 mmHg 97.7 F (36.5 C) Oral 82  16  94 % - -    02/29/12 1854 125/57 mmHg 97.7 F (36.5 C) Oral 84  16  97 % - -  02/29/12 1826 122/54 mmHg 97.7 F (36.5 C) Oral 82  16  95 % - -  02/29/12 1755 144/62 mmHg 97.7 F (36.5 C) Oral 88  16  95 % - -  02/29/12 1500 - - - - - - 5\' 8"  (1.727 m) 181 lb (82.101 kg)  02/29/12 1323 126/52 mmHg 97.4 F (36.3 C) Oral 68  18  100 % - -  02/29/12 1304 - 97.7 F (36.5 C) - - - 100 % - -  02/29/12 1259 116/55 mmHg - - 87  17  100 % - -  02/29/12 1247 109/43 mmHg - - 88  16  100 % - -  02/29/12 1230 107/33 mmHg - - 90  22  100 % - -  02/29/12 1216 - - - 88  14  100 % - -  02/29/12 1215 - - - - 30  - - -  02/29/12 1214 125/51 mmHg 98.2 F (36.8 C) - - - - - -  02/29/12 0845 157/66 mmHg 98 F (36.7 C) Oral 75  18  98 % - -  PE: right lower Incision is healing well, skin color is bruising, normal ,sensation in RLE is intact;  There is a good thrill and good bruit in the right thigh graft Good motion in RLE with warm and pink foot. RUE AVF still functional  Assessment/Plan Andrew [name] is a 79 y.o. year old male who presents s/p creation/revision of right lower extremity Hemodialysis access. Follow-up in 4 weeks  The patient's access will be ready for use in 4 weeks.  Home today after HD  ROCZNIAK,REGINA J 03/01/2012 8:13 AM   I have examined the patient, reviewed and agree with above.  Emili Mcloughlin, MD 03/01/2012 9:27 AM

## 2012-03-01 NOTE — Progress Notes (Signed)
Spoke with [place], [place]. Reported that pt walked the hall one time to the end and back to his room. Pt did well but moved slowly and complained of leg being sore where new graft was placed. Daughter reported pt being a little disoriented, but said it is probably from being tired after having dialysis today. Pt able to answer orientation questions correctly. PA said it was still ok for pt to go home today if daughter was comfortable taking him home. Daughter is ok with taking pt home and said she is going to stay with him tonight. [place], Rosanna Randy

## 2012-03-01 NOTE — Progress Notes (Signed)
Pharmacy: Pepcid  Patient's tolerating PO meds.  Pepcid changed to PO per P&T policy.  Dorna Leitz, PharmD, BCPS

## 2012-03-01 NOTE — ED Notes (Signed)
Pt was dc from [facility] today after having dialysis graft placed. Pt here for ?weakness, cp x 4 days and sob.

## 2012-03-01 NOTE — Progress Notes (Signed)
Pt seen on HD AP 120 Vp 190.  No CO.  + bruit in Rt thigh AVG

## 2012-03-01 NOTE — ED Notes (Signed)
Dr. Orvan Falconer at bedside to evaluate for admission.

## 2012-03-01 NOTE — ED Provider Notes (Addendum)
History   This chart was scribed for Andrew Booze, MD by [name] . The patient was seen in room APA18/APA18 and the patient's care was started at 8:23pm.   CSN: [id number]  Arrival date & time 03/01/12  2006   First MD Initiated Contact with Patient 03/01/12 2021      Chief Complaint  Patient presents with  . Weakness    (Consider location/radiation/quality/duration/timing/severity/associated sxs/prior treatment) HPI Andrew [name] is a 78 y.o. male who presents to the Emergency Department complaining of constant, moderate generalized weakness. Daughter reports that the patient was discharged from [facility] earlier today after a dialysis graft surgery yesterday. Daughter reports that the patient is normally able to ambulate, but today has been unable to ambulate. Daughter also reports that the patient has had more confusion than normal. Daughter reports that the patient states he is able to produce some urine, but daughter is unsure. Daughter notes that the patient is a dialysis patient, and received his last treatment today. Daughter states that patient's symptoms are worsening.   PCP: Dr. Sherwood [name]   Past Medical History  Diagnosis Date  . Secondary hyperparathyroidism   . Peripheral vascular disease   . Hyperlipidemia   . Duodenal ulcer     remote  . Gout STABLE  . ESRD (end stage renal disease) NEPHROLOGIST-   DR [name]    Hemodialysis; Hx of bleeding from aneurysms on AVF  . Arteriosclerotic cardiovascular disease (ASCVD) 1998    CABG-1998; h/o CHF  . Cerebrovascular disease REMOTE --  PER CHART--    NO RESIDUAL  . Neuropathy of foot COLD FEET  . Degenerative joint disease   . Gastroesophageal reflux disease   . History of bladder cancer   . Impaired hearing BILATERAL     ONLY WEARS LEFT HEARING AID  . Blood transfusion   . Anemia   . Renal cell carcinoma 2001    s/p right nephrectomy-2001; subsequent ESRD  . Hypertension     Past Surgical History    Procedure Date  . Right nephrectomy 2001    Renal cell carcinoma  . Colonoscopy 01/24/2011    prominent vascular pattern, suboptimal prep but doable  . Esophagogastroduodenoscopy 01/24/2011    mild erosive reflux esophagitis, bulbar/antral erosions, bx from antrum benign  . Av fistula repair 2008    revision of anastomosis of Right AVF  . Arteriovenous graft placement 2006    Thrombectomy and interposition jump graft revision to higher axillary vein of LUA AVG  . Coronary artery bypass graft 1998    X4 VESSELS  . Multiple cysto/ resection tumor's with bx's LAST ONE 05-09-2011  . Multiple surg's / interventions for avgg/ fistula right upper arm LAST REVISION 06-29-2011    (JUNE 2010 STENT CEPHALIC VEIN/ 03-23-2011 DILATATION ANGIOPLASTY)  . Left ptc left renal artery   . Cardiac catheterization 2004  . Cataract extraction w/ intraocular lens  implant, bilateral   . Cystoscopy w/ retrogrades 12/28/2011    Procedure: CYSTOSCOPY WITH RETROGRADE PYELOGRAM;  Surgeon: [name], MD;  Location: [facility];  Service: Urology;  Laterality: Left;  C-ARM   . Transurethral resection of bladder tumor 12/28/2011    Procedure: TRANSURETHRAL RESECTION OF BLADDER TUMOR (TURBT);  Surgeon: [name], MD;  Location: [facility];  Service: Urology;  Laterality: N/A;  . Av fistula placement 02/29/2012    Procedure: INSERTION OF ARTERIOVENOUS (AV) GORE-TEX GRAFT THIGH;  Surgeon: [name], MD;  Location: [facility] OR;  Service: Vascular;  Laterality: Right;  Insertion right femoral arteriovenous gortex graft    Family History  Problem Relation Age of Onset  . Colon cancer Neg Hx   . Liver disease Neg Hx   . Heart disease Father     History  Substance Use Topics  . Smoking status: Former Smoker -- 1.0 packs/day for 25 years    Types: Cigarettes    Quit date: 02/26/1984  . Smokeless tobacco: Never Used   Comment: quit 1985  . Alcohol Use: No      Review of Systems A  complete 10 system review of systems was obtained and all systems are negative except as noted in the HPI and PMH.   Allergies  Review of patient's allergies indicates no known allergies.  Home Medications   Current Outpatient Rx  Name Route Sig Dispense Refill  . ACETAMINOPHEN 500 MG PO TABS Oral Take 1,000 mg by mouth every 6 (six) hours as needed. For pain/headache    . ALLOPURINOL 100 MG PO TABS Oral Take 100 mg by mouth daily.    . ASPIRIN 81 MG PO TABS Oral Take 81 mg by mouth daily.     . ATORVASTATIN CALCIUM 10 MG PO TABS Oral Take 10 mg by mouth daily.     Marland Kitchen DIALYVITE 800/ZINC PO Oral Take 1 tablet by mouth daily.     Marland Kitchen CALCIUM CARBONATE ANTACID 500 MG PO CHEW Oral Chew 1 tablet by mouth daily as needed. For upset stomach    . CINACALCET HCL 30 MG PO TABS Oral Take 30 mg by mouth daily.     . COLCHICINE 0.6 MG PO TABS Oral Take 0.3 mg by mouth 2 (two) times daily.    Marland Kitchen GABAPENTIN 100 MG PO CAPS Oral Take 100 mg by mouth 2 (two) times daily.    Marland Kitchen OMEPRAZOLE 40 MG PO CPDR Oral Take 40 mg by mouth daily as needed. For reflux    . OVER THE COUNTER MEDICATION Topical Apply 1 application topically daily as needed. For dry feet  otc healing balm    . OXYCODONE-ACETAMINOPHEN 5-325 MG PO TABS Oral Take 1 tablet by mouth every 6 (six) hours as needed for pain. 30 tablet 0  . POLYETHYLENE GLYCOL 3350 PO POWD Oral Take 17 g by mouth daily.     Marland Kitchen SEVELAMER HCL 800 MG PO TABS Oral Take 1,600-3,200 mg by mouth 4 (four) times daily. Patient takes 4 tablets with meals and 2 tablets with snacks      BP 139/55  Pulse 102  Temp(Src) 99.7 F (37.6 C) (Oral)  Resp 18  Ht 5\' 8"  (1.727 m)  Wt 181 lb (82.101 kg)  BMI 27.52 kg/m2  SpO2 98%  Physical Exam  Nursing note and vitals reviewed. Constitutional: He appears well-developed and well-nourished. No distress.       Appears weak.   HENT:  Head: Normocephalic and atraumatic.  Right Ear: External ear normal.  Left Ear: External ear normal.    Nose: Nose normal.       Dry mucous membranes.   Eyes: EOM are normal. Pupils are equal, round, and reactive to light.  Neck: Normal range of motion. Neck supple. No tracheal deviation present.  Cardiovascular: Normal rate, regular rhythm and normal heart sounds.  Exam reveals no gallop and no friction rub.   No murmur heard. Pulmonary/Chest: Effort normal and breath sounds normal. No respiratory distress. He has no wheezes. He has no rales.  Abdominal: Soft. Bowel sounds  are normal. He exhibits no distension. There is no tenderness.  Musculoskeletal: Normal range of motion. He exhibits no edema.       AV shunt in right upper arm with thrill present. AV shunt in right thigh with thrill present. Surgical dressing in place and was not removed.   Neurological: He is alert. No sensory deficit.       Oriented to person. Incompletely oriented to place and time.   Skin: Skin is warm and dry.  Psychiatric: He has a normal mood and affect. His behavior is normal.    ED Course  Procedures (including critical care time)  DIAGNOSTIC STUDIES: Oxygen Saturation is 98% on room air, normal by my interpretation.    COORDINATION OF CARE:  2030: Discussed planned course of treatment with the patient's daughter, who is agreeable at this time.   Results for orders placed during the hospital encounter of 03/01/12  CBC      Component Value Range   WBC 5.2  4.0 - 10.5 (K/uL)   RBC 3.13 (*) 4.22 - 5.81 (MIL/uL)   Hemoglobin 10.5 (*) 13.0 - 17.0 (g/dL)   HCT 30.8 (*) 65.7 - 52.0 (%)   MCV 108.3 (*) 78.0 - 100.0 (fL)   MCH 33.5  26.0 - 34.0 (pg)   MCHC 31.0  30.0 - 36.0 (g/dL)   RDW 84.6 (*) 96.2 - 15.5 (%)   Platelets 97 (*) 150 - 400 (K/uL)  DIFFERENTIAL      Component Value Range   Neutrophils Relative 73  43 - 77 (%)   Neutro Abs 3.8  1.7 - 7.7 (K/uL)   Lymphocytes Relative 14  12 - 46 (%)   Lymphs Abs 0.8  0.7 - 4.0 (K/uL)   Monocytes Relative 9  3 - 12 (%)   Monocytes Absolute 0.5  0.1 -  1.0 (K/uL)   Eosinophils Relative 4  0 - 5 (%)   Eosinophils Absolute 0.2  0.0 - 0.7 (K/uL)   Basophils Relative 0  0 - 1 (%)   Basophils Absolute 0.0  0.0 - 0.1 (K/uL)  BASIC METABOLIC PANEL      Component Value Range   Sodium 138  135 - 145 (mEq/L)   Potassium 5.4 (*) 3.5 - 5.1 (mEq/L)   Chloride 96  96 - 112 (mEq/L)   CO2 30  19 - 32 (mEq/L)   Glucose, Bld 107 (*) 70 - 99 (mg/dL)   BUN 16  6 - 23 (mg/dL)   Creatinine, Ser 9.52 (*) 0.50 - 1.35 (mg/dL)   Calcium 9.0  8.4 - 84.1 (mg/dL)   GFR calc non Af Amer 11 (*) >90 (mL/min)   GFR calc Af Amer 12 (*) >90 (mL/min)  HEPATIC FUNCTION PANEL      Component Value Range   Total Protein 6.4  6.0 - 8.3 (g/dL)   Albumin 3.2 (*) 3.5 - 5.2 (g/dL)   AST 20  0 - 37 (U/L)   ALT <5  0 - 53 (U/L)   Alkaline Phosphatase 103  39 - 117 (U/L)   Total Bilirubin 0.6  0.3 - 1.2 (mg/dL)   Bilirubin, Direct 0.2  0.0 - 0.3 (mg/dL)   Indirect Bilirubin 0.4  0.3 - 0.9 (mg/dL)  CARDIAC PANEL(CRET KIN+CKTOT+MB+TROPI)      Component Value Range   Total CK 70  7 - 232 (U/L)   CK, MB 1.9  0.3 - 4.0 (ng/mL)   Troponin I <0.30  <0.30 (ng/mL)   Relative Index RELATIVE INDEX  IS INVALID  0.0 - 2.5     Dg Chest 2 View  02/29/2012  *RADIOLOGY REPORT*  Clinical Data: Preop.  Follow-up possible CHF.  CHEST - 2 VIEW  Comparison: 09/17/2011  Findings: Prior CABG.  Heart is upper limits normal in size.  Lungs are clear.  No effusions or edema.  No acute bony abnormality. Right vascular stent is noted in the subclavian region.  Surgical clips in the left axilla.  IMPRESSION: No active cardiopulmonary disease.  Original Report Authenticated By: Cyndie Chime, M.D.    Date: 03/01/2012  Rate: 104  Rhythm: sinus tachycardia  QRS Axis: normal  Intervals: normal  ST/T Wave abnormalities: normal  Conduction Disutrbances:none  Narrative Interpretation: Sinus tachycardia, otherwise normal ECG. When compared with ECG of the 22 2012, no significant changes are seen to  Old  EKG Reviewed: unchanged  Workup has failed to show any acute process to account for his change in functional status or mental status. Case is discussed with Dr. Orvan Falconer of [facility] who agrees to come and evaluate the patient.  1. Weakness   2. Confusion   3. End stage renal failure on dialysis       MDM  Weakness and confusion, etiology unclear. He was dialyzed today so it should not be any dialysis issue. He will get a CT of the head, and chest x-ray will be obtained. Early makes urine, but if he does, a urinalysis will be obtained. There is no obvious of infection to account for his mental status change. Consider prolonged effects of anesthesia.  I personally performed the services described in this documentation, which was scribed in my presence. The recorded information has been reviewed and considered.          Andrew Booze, MD 03/01/12 1610  Andrew Booze, MD 03/01/12 [phone]

## 2012-03-01 NOTE — Progress Notes (Signed)
UR COMPLETED  

## 2012-03-02 ENCOUNTER — Encounter (HOSPITAL_COMMUNITY): Payer: Self-pay | Admitting: *Deleted

## 2012-03-02 LAB — URINALYSIS, ROUTINE W REFLEX MICROSCOPIC
Bilirubin Urine: NEGATIVE
Glucose, UA: NEGATIVE mg/dL
Ketones, ur: NEGATIVE mg/dL
pH: 8.5 — ABNORMAL HIGH (ref 5.0–8.0)

## 2012-03-02 LAB — CBC
MCHC: 30.4 g/dL (ref 30.0–36.0)
MCV: 107.9 fL — ABNORMAL HIGH (ref 78.0–100.0)
Platelets: 89 10*3/uL — ABNORMAL LOW (ref 150–400)
RDW: 17.5 % — ABNORMAL HIGH (ref 11.5–15.5)
WBC: 5.1 10*3/uL (ref 4.0–10.5)

## 2012-03-02 LAB — MAGNESIUM: Magnesium: 2.1 mg/dL (ref 1.5–2.5)

## 2012-03-02 LAB — BASIC METABOLIC PANEL
BUN: 23 mg/dL (ref 6–23)
Calcium: 9 mg/dL (ref 8.4–10.5)
Creatinine, Ser: 5.57 mg/dL — ABNORMAL HIGH (ref 0.50–1.35)
GFR calc Af Amer: 10 mL/min — ABNORMAL LOW (ref >90)

## 2012-03-02 LAB — IRON AND TIBC
Iron: 10 ug/dL — ABNORMAL LOW (ref 42–135)
TIBC: 214 ug/dL — ABNORMAL LOW (ref 215–435)
UIBC: 204 ug/dL (ref 125–400)

## 2012-03-02 LAB — URINE MICROSCOPIC-ADD ON

## 2012-03-02 MED ORDER — ONDANSETRON HCL 4 MG/2ML IJ SOLN: 4.0000 mg | Freq: Four times a day (QID) | INTRAMUSCULAR | Status: DC | PRN

## 2012-03-02 MED ORDER — SODIUM CHLORIDE 0.9 % IJ SOLN
3.0000 mL | Freq: Two times a day (BID) | INTRAMUSCULAR | Status: DC
  Administered 2012-03-02 – 2012-03-03 (×4): 3 mL via INTRAVENOUS
  Filled 2012-03-02: qty 3

## 2012-03-02 MED ORDER — ACETAMINOPHEN 650 MG RE SUPP: 650.0000 mg | Freq: Four times a day (QID) | RECTAL | Status: DC | PRN

## 2012-03-02 MED ORDER — ALLOPURINOL 100 MG PO TABS
200.0000 mg | ORAL_TABLET | Freq: Every day | ORAL | Status: DC
  Administered 2012-03-02 – 2012-03-03 (×2): 200 mg via ORAL
  Filled 2012-03-02 (×2): qty 2

## 2012-03-02 MED ORDER — ASPIRIN EC 81 MG PO TBEC
81.0000 mg | DELAYED_RELEASE_TABLET | Freq: Every day | ORAL | Status: DC
  Administered 2012-03-02 – 2012-03-03 (×2): 81 mg via ORAL
  Filled 2012-03-02 (×2): qty 1

## 2012-03-02 MED ORDER — ONDANSETRON HCL 4 MG PO TABS: 4.0000 mg | ORAL_TABLET | Freq: Four times a day (QID) | ORAL | Status: DC | PRN

## 2012-03-02 MED ORDER — POLYETHYLENE GLYCOL 3350 17 GM/SCOOP PO POWD
17.0000 g | Freq: Every day | ORAL | Status: DC
  Filled 2012-03-02: qty 255

## 2012-03-02 MED ORDER — DOCUSATE SODIUM 100 MG PO CAPS
100.0000 mg | ORAL_CAPSULE | Freq: Two times a day (BID) | ORAL | Status: DC
  Administered 2012-03-02 – 2012-03-03 (×3): 100 mg via ORAL
  Filled 2012-03-02 (×3): qty 1

## 2012-03-02 MED ORDER — CINACALCET HCL 30 MG PO TABS
30.0000 mg | ORAL_TABLET | Freq: Every day | ORAL | Status: DC
  Administered 2012-03-02 – 2012-03-03 (×2): 30 mg via ORAL
  Filled 2012-03-02 (×3): qty 1

## 2012-03-02 MED ORDER — ENOXAPARIN SODIUM 30 MG/0.3ML ~~LOC~~ SOLN
30.0000 mg | Freq: Every day | SUBCUTANEOUS | Status: DC
  Administered 2012-03-02 – 2012-03-03 (×2): 30 mg via SUBCUTANEOUS
  Filled 2012-03-02 (×3): qty 0.3

## 2012-03-02 MED ORDER — POLYETHYLENE GLYCOL 3350 17 G PO PACK
17.0000 g | PACK | Freq: Every day | ORAL | Status: DC
  Administered 2012-03-02 – 2012-03-03 (×2): 17 g via ORAL
  Filled 2012-03-02 (×2): qty 1

## 2012-03-02 MED ORDER — COLCHICINE 0.6 MG PO TABS
0.6000 mg | ORAL_TABLET | Freq: Two times a day (BID) | ORAL | Status: DC
  Administered 2012-03-02 – 2012-03-03 (×3): 0.6 mg via ORAL
  Filled 2012-03-02 (×3): qty 1

## 2012-03-02 MED ORDER — SEVELAMER CARBONATE 800 MG PO TABS
1600.0000 mg | ORAL_TABLET | ORAL | Status: DC | PRN
  Administered 2012-03-02: 1600 mg via ORAL
  Filled 2012-03-02: qty 2

## 2012-03-02 MED ORDER — ATORVASTATIN CALCIUM 10 MG PO TABS
10.0000 mg | ORAL_TABLET | Freq: Every day | ORAL | Status: DC
  Administered 2012-03-02: 10 mg via ORAL
  Filled 2012-03-02: qty 1

## 2012-03-02 MED ORDER — SEVELAMER CARBONATE 800 MG PO TABS
3200.0000 mg | ORAL_TABLET | Freq: Three times a day (TID) | ORAL | Status: DC
  Administered 2012-03-02 – 2012-03-03 (×5): 3200 mg via ORAL
  Filled 2012-03-02 (×3): qty 4

## 2012-03-02 MED ORDER — GABAPENTIN 100 MG PO CAPS
100.0000 mg | ORAL_CAPSULE | Freq: Two times a day (BID) | ORAL | Status: DC
  Administered 2012-03-02 – 2012-03-03 (×3): 100 mg via ORAL
  Filled 2012-03-02 (×3): qty 1

## 2012-03-02 MED ORDER — PANTOPRAZOLE SODIUM 40 MG PO TBEC
80.0000 mg | DELAYED_RELEASE_TABLET | Freq: Every day | ORAL | Status: DC
  Administered 2012-03-02 – 2012-03-03 (×2): 80 mg via ORAL
  Filled 2012-03-02: qty 1
  Filled 2012-03-02: qty 2

## 2012-03-02 MED ORDER — SEVELAMER HCL 800 MG PO TABS
1600.0000 mg | ORAL_TABLET | Freq: Three times a day (TID) | ORAL | Status: DC
  Filled 2012-03-02 (×10): qty 4

## 2012-03-02 MED ORDER — ACETAMINOPHEN 325 MG PO TABS: 650.0000 mg | ORAL_TABLET | Freq: Four times a day (QID) | ORAL | Status: DC | PRN

## 2012-03-02 NOTE — Progress Notes (Addendum)
Chart reviewed.  Subjective: Feels better. Would like to go home today. Per nursing, still slightly confused and slightly weak. Objective: Vital signs in last 24 hours: Filed Vitals:   03/02/12 0221 03/02/12 0414 03/02/12 0652 03/02/12 1038  BP: 137/56 136/65  112/68  Pulse: 99 94  96  Temp: 98.6 F (37 C) 97.8 F (36.6 C)  98.2 F (36.8 C)  TempSrc: Oral Oral  Oral  Resp: 18 18  18   Height: 5\' 8"  (1.727 m)     Weight: 81.92 kg (180 lb 9.6 oz)  81.92 kg (180 lb 9.6 oz)   SpO2: 95% 95%  96%   Weight change:   Intake/Output Summary (Last 24 hours) at 03/02/12 1159 Last data filed at 03/02/12 1132  Gross per 24 hour  Intake    243 ml  Output    175 ml  Net     68 ml   Physical Exam: General: Asleep, arousable, appropriate Lungs clear to auscultation bilaterally without wheeze rhonchi or rales Cardiovascular regular rate rhythm without murmurs gallops rubs Abdomen soft nontender nondistended Extremities no edema  Lab Results: Basic Metabolic Panel:  Lab 03/02/12 4098 03/01/12 2016  NA 136 138  K 4.8 5.4*  CL 98 96  CO2 30 30  GLUCOSE 82 107*  BUN 23 16  CREATININE 5.57* 4.59*  CALCIUM 9.0 9.0  MG 2.1 --  PHOS -- --   Liver Function Tests:  Lab 03/01/12 2016  AST 20  ALT <5  ALKPHOS 103  BILITOT 0.6  PROT 6.4  ALBUMIN 3.2*   No results found for this basename: LIPASE:2,AMYLASE:2 in the last 168 hours No results found for this basename: AMMONIA:2 in the last 168 hours CBC:  Lab 03/02/12 0431 03/01/12 2016  WBC 5.1 5.2  NEUTROABS -- 3.8  HGB 10.0* 10.5*  HCT 32.9* 33.9*  MCV 107.9* 108.3*  PLT 89* 97*   Cardiac Enzymes:  Lab 03/01/12 2016  CKTOTAL 70  CKMB 1.9  CKMBINDEX --  TROPONINI <0.30   BNP: No results found for this basename: PROBNP:3 in the last 168 hours D-Dimer: No results found for this basename: DDIMER:2 in the last 168 hours CBG:  Lab 03/01/12 0801  GLUCAP 95   Hemoglobin A1C: No results found for this basename: HGBA1C  in the last 168 hours Fasting Lipid Panel: No results found for this basename: CHOL,HDL,LDLCALC,TRIG,CHOLHDL,LDLDIRECT in the last 119 hours Thyroid Function Tests: No results found for this basename: TSH,T4TOTAL,FREET4,T3FREE,THYROIDAB in the last 168 hours Coagulation: No results found for this basename: LABPROT:4,INR:4 in the last 168 hours Anemia Panel:  Lab 03/01/12 2306  VITAMINB12 --  FOLATE --  FERRITIN --  TIBC --  IRON --  RETICCTPCT 1.9   Urine Drug Screen: Drugs of Abuse  No results found for this basename: labopia, cocainscrnur, labbenz, amphetmu, thcu, labbarb    Alcohol Level: No results found for this basename: ETH:2 in the last 168 hours Urinalysis:  Lab 03/02/12 0612  COLORURINE YELLOW  LABSPEC 1.020  PHURINE 8.5*  GLUCOSEU NEGATIVE  HGBUR SMALL*  BILIRUBINUR NEGATIVE  KETONESUR NEGATIVE  PROTEINUR 30*  UROBILINOGEN 0.2  NITRITE NEGATIVE  LEUKOCYTESUR NEGATIVE   Micro Results: Recent Results (from the past 240 hour(s))  SURGICAL PCR SCREEN     Status: Normal   Collection Time   02/29/12  8:54 AM      Component Value Range Status Comment   MRSA, PCR NEGATIVE  NEGATIVE  Final    Staphylococcus aureus NEGATIVE  NEGATIVE  Final    Studies/Results: Ct Head Wo Contrast  03/01/2012  *RADIOLOGY REPORT*  Clinical Data: Weakness.  Hypertension.  Prior CVA.  Peripheral vascular disease.  Bladder cancer.  CT HEAD WITHOUT CONTRAST  Technique:  Contiguous axial images were obtained from the base of the skull through the vertex without contrast.  Comparison: 06/12/2010  Findings: The brain stem, cerebellum, cerebral peduncles, thalami, basal ganglia, basilar cisterns, and ventricular system appear unremarkable.  Periventricular and corona radiata white matter hypodensities are most compatible with chronic ischemic microvascular white matter disease.  No intracranial hemorrhage, mass lesion, or acute infarction is identified.  Minimal polypoid mucoperiosteal thickening  noted in the maxillary sinuses.  Old right medial orbital wall fracture noted.  There is a small chronic erosion of the odontoid.  Atherosclerotic calcification of the carotid siphons noted.  IMPRESSION: 1.  Mild chronic paranasal sinusitis.  2. Periventricular and corona radiata white matter hypodensities are most compatible with chronic ischemic microvascular white matter disease.  3.  Atherosclerosis.  4.  Old right medial orbital wall fracture.  Original Report Authenticated By: Dellia Cloud, M.D.   Dg Chest Portable 1 View  03/01/2012  *RADIOLOGY REPORT*  Clinical Data: Weakness, history of CABG and end-stage renal disease  PORTABLE CHEST - 1 VIEW  Comparison: Chest x-ray of 02/29/2012  Findings: No active infiltrate or effusion is seen.  Cardiomegaly is stable.  Median sternotomy sutures are noted.  IMPRESSION: Stable cardiomegaly.  No active lung disease.  Original Report Authenticated By: Juline Patch, M.D.   Scheduled Meds:   . allopurinol  200 mg Oral Daily  . aspirin EC  81 mg Oral Daily  . atorvastatin  10 mg Oral Daily  . cinacalcet  30 mg Oral Daily  . colchicine  0.6 mg Oral BID  . docusate sodium  100 mg Oral BID  . enoxaparin  30 mg Subcutaneous Daily  . gabapentin  100 mg Oral BID  . haloperidol lactate  1 mg Intravenous Once  . pantoprazole  80 mg Oral Q1200  . polyethylene glycol  17 g Oral Daily  . sevelamer  3,200 mg Oral TID WC  . sodium chloride  3 mL Intravenous Q12H  . DISCONTD: polyethylene glycol powder  17 g Oral Daily  . DISCONTD: sevelamer  1,600-3,200 mg Oral TID WC & HS   Continuous Infusions:  PRN Meds:.acetaminophen, acetaminophen, ondansetron (ZOFRAN) IV, ondansetron, sevelamer Assessment/Plan: Active Problems:  SECONDARY HYPERPARATHYROIDISM  ANEMIA  DUODENAL ULCER, HX OF  Hypertension  Hyperlipidemia  ESRD (end stage renal disease)  Peripheral neuropathy  Delirium  Gait abnormality  Suspect confusion and difficulty walking related to  anesthesia. Reportedly improved. Will have nursing walk the patient around it and the halls today, monitor for another day, because nurses report occasional confusion. Probably home tomorrow if stable. Receives dialysis Monday Wednesday Fridays. Discontinue telemetry.   LOS: 1 day   Modestine Scherzinger L 03/02/2012, 11:59 AM

## 2012-03-02 NOTE — ED Notes (Signed)
Patient lying in bed in no distress.  Daughter remains at bedside.

## 2012-03-03 LAB — FERRITIN: Ferritin: 389 ng/mL — ABNORMAL HIGH (ref 22–322)

## 2012-03-03 NOTE — Discharge Summary (Signed)
Physician Discharge Summary  Patient ID: Andrew [name] MRN: [id number] DOB/AGE: 12-14-1925 78 y.o.  Admit date: 03/01/2012 Discharge date: 03/03/2012  Discharge Diagnoses:  Active Problems:  Delirium  Gait abnormality  SECONDARY HYPERPARATHYROIDISM  ANEMIA  DUODENAL ULCER, HX OF  Hypertension  Hyperlipidemia  ESRD (end stage renal disease)  Peripheral neuropathy   Medication List  As of 03/03/2012  8:14 AM   TAKE these medications         acetaminophen 500 MG tablet   Commonly known as: TYLENOL   Take 1,000 mg by mouth every 6 (six) hours as needed. For pain/headache      allopurinol 100 MG tablet   Commonly known as: ZYLOPRIM   Take 200 mg by mouth daily.      aspirin EC 81 MG tablet   Take 81 mg by mouth daily.      atorvastatin 10 MG tablet   Commonly known as: LIPITOR   Take 10 mg by mouth daily.      calcium carbonate 500 MG chewable tablet   Commonly known as: TUMS - dosed in mg elemental calcium   Chew 1 tablet by mouth daily as needed. For upset stomach      cinacalcet 30 MG tablet   Commonly known as: SENSIPAR   Take 30 mg by mouth daily.      COLCRYS 0.6 MG tablet   Generic drug: colchicine   Take 0.6 mg by mouth 2 (two) times daily. Prn for gout      DIALYVITE 800/ZINC PO   Take 1 tablet by mouth daily.      docusate sodium 100 MG capsule   Commonly known as: COLACE   Take 100 mg by mouth 2 (two) times daily.      gabapentin 100 MG capsule   Commonly known as: NEURONTIN   Take 100 mg by mouth 2 (two) times daily.      MIRALAX powder   Generic drug: polyethylene glycol powder   Take 17 g by mouth daily.      omeprazole 40 MG capsule   Commonly known as: PRILOSEC   Take 40 mg by mouth at bedtime as needed. For reflux      OVER THE COUNTER MEDICATION   Apply 1 application topically daily as needed. For dry feet  otc healing balm      oxyCODONE-acetaminophen 5-325 MG per tablet   Commonly known as: PERCOCET   Take 1 tablet by mouth every 6  (six) hours as needed for pain.      sevelamer 800 MG tablet   Commonly known as: RENAGEL   Take 1,600-3,200 mg by mouth 4 (four) times daily. Patient takes 4 tablets with meals and 2 tablets with snacks            Discharge Orders    Future Appointments: Provider: Department: Dept Phone: Center:   03/26/2012 3:30 PM [name], MD [facility] [phone] [facility]     Future Orders Please Complete By Expires   Increase activity slowly      Discharge instructions      Comments:   Renal diet   Call MD for:  redness, tenderness, or signs of infection (pain, swelling, redness, odor or green/yellow discharge around incision site)      Call MD for:  temperature >100.4         Follow-up Information    Follow up with EARLY, TODD, MD. (If symptoms worsen as needed)    Contact information:  [address] [place] [address] [phone]          Disposition: 01-Home or Self Care  Discharged Condition: Stable  Consults:   none  Labs:   Results for orders placed during the hospital encounter of 03/01/12 (from the past 48 hour(s))  CBC     Status: Abnormal   Collection Time   03/01/12  8:16 PM      Component Value Range Comment   WBC 5.2  4.0 - 10.5 (K/uL)    RBC 3.13 (*) 4.22 - 5.81 (MIL/uL)    Hemoglobin 10.5 (*) 13.0 - 17.0 (g/dL)    HCT 91.4 (*) 78.2 - 52.0 (%)    MCV 108.3 (*) 78.0 - 100.0 (fL)    MCH 33.5  26.0 - 34.0 (pg)    MCHC 31.0  30.0 - 36.0 (g/dL)    RDW 95.6 (*) 21.3 - 15.5 (%)    Platelets 97 (*) 150 - 400 (K/uL)   DIFFERENTIAL     Status: Normal   Collection Time   03/01/12  8:16 PM      Component Value Range Comment   Neutrophils Relative 73  43 - 77 (%)    Neutro Abs 3.8  1.7 - 7.7 (K/uL)    Lymphocytes Relative 14  12 - 46 (%)    Lymphs Abs 0.8  0.7 - 4.0 (K/uL)    Monocytes Relative 9  3 - 12 (%)    Monocytes Absolute 0.5  0.1 - 1.0 (K/uL)    Eosinophils Relative 4  0 - 5 (%)    Eosinophils Absolute 0.2  0.0 - 0.7 (K/uL)     Basophils Relative 0  0 - 1 (%)    Basophils Absolute 0.0  0.0 - 0.1 (K/uL)   BASIC METABOLIC PANEL     Status: Abnormal   Collection Time   03/01/12  8:16 PM      Component Value Range Comment   Sodium 138  135 - 145 (mEq/L)    Potassium 5.4 (*) 3.5 - 5.1 (mEq/L)    Chloride 96  96 - 112 (mEq/L)    CO2 30  19 - 32 (mEq/L)    Glucose, Bld 107 (*) 70 - 99 (mg/dL)    BUN 16  6 - 23 (mg/dL)    Creatinine, Ser 0.86 (*) 0.50 - 1.35 (mg/dL)    Calcium 9.0  8.4 - 10.5 (mg/dL)    GFR calc non Af Amer 11 (*) >90 (mL/min)    GFR calc Af Amer 12 (*) >90 (mL/min)   HEPATIC FUNCTION PANEL     Status: Abnormal   Collection Time   03/01/12  8:16 PM      Component Value Range Comment   Total Protein 6.4  6.0 - 8.3 (g/dL)    Albumin 3.2 (*) 3.5 - 5.2 (g/dL)    AST 20  0 - 37 (U/L)    ALT <5  0 - 53 (U/L)    Alkaline Phosphatase 103  39 - 117 (U/L)    Total Bilirubin 0.6  0.3 - 1.2 (mg/dL)    Bilirubin, Direct 0.2  0.0 - 0.3 (mg/dL)    Indirect Bilirubin 0.4  0.3 - 0.9 (mg/dL)   CARDIAC PANEL(CRET KIN+CKTOT+MB+TROPI)     Status: Normal   Collection Time   03/01/12  8:16 PM      Component Value Range Comment   Total CK 70  7 - 232 (U/L)    CK, MB 1.9  0.3 -  4.0 (ng/mL)    Troponin I <0.30  <0.30 (ng/mL)    Relative Index RELATIVE INDEX IS INVALID  0.0 - 2.5    VITAMIN B12     Status: Abnormal   Collection Time   03/01/12 11:06 PM      Component Value Range Comment   Vitamin B-12 1103 (*) 211 - 911 (pg/mL)   FOLATE     Status: Normal   Collection Time   03/01/12 11:06 PM      Component Value Range Comment   Folate 17.9     IRON AND TIBC     Status: Abnormal   Collection Time   03/01/12 11:06 PM      Component Value Range Comment   Iron 10 (*) 42 - 135 (ug/dL)    TIBC 161 (*) 096 - 435 (ug/dL)    Saturation Ratios 5 (*) 20 - 55 (%)    UIBC 204  125 - 400 (ug/dL)   FERRITIN     Status: Abnormal   Collection Time   03/01/12 11:06 PM      Component Value Range Comment   Ferritin 389 (*) 22 - 322  (ng/mL)   RETICULOCYTES     Status: Abnormal   Collection Time   03/01/12 11:06 PM      Component Value Range Comment   Retic Ct Pct 1.9  0.4 - 3.1 (%)    RBC. 3.12 (*) 4.22 - 5.81 (MIL/uL)    Retic Count, Manual 59.3  19.0 - 186.0 (K/uL)   MAGNESIUM     Status: Normal   Collection Time   03/02/12  4:31 AM      Component Value Range Comment   Magnesium 2.1  1.5 - 2.5 (mg/dL)   BASIC METABOLIC PANEL     Status: Abnormal   Collection Time   03/02/12  4:31 AM      Component Value Range Comment   Sodium 136  135 - 145 (mEq/L)    Potassium 4.8  3.5 - 5.1 (mEq/L)    Chloride 98  96 - 112 (mEq/L)    CO2 30  19 - 32 (mEq/L)    Glucose, Bld 82  70 - 99 (mg/dL)    BUN 23  6 - 23 (mg/dL)    Creatinine, Ser 0.45 (*) 0.50 - 1.35 (mg/dL)    Calcium 9.0  8.4 - 10.5 (mg/dL)    GFR calc non Af Amer 8 (*) >90 (mL/min)    GFR calc Af Amer 10 (*) >90 (mL/min)   CBC     Status: Abnormal   Collection Time   03/02/12  4:31 AM      Component Value Range Comment   WBC 5.1  4.0 - 10.5 (K/uL)    RBC 3.05 (*) 4.22 - 5.81 (MIL/uL)    Hemoglobin 10.0 (*) 13.0 - 17.0 (g/dL)    HCT 40.9 (*) 81.1 - 52.0 (%)    MCV 107.9 (*) 78.0 - 100.0 (fL)    MCH 32.8  26.0 - 34.0 (pg)    MCHC 30.4  30.0 - 36.0 (g/dL)    RDW 91.4 (*) 78.2 - 15.5 (%)    Platelets 89 (*) 150 - 400 (K/uL)   URINALYSIS, ROUTINE W REFLEX MICROSCOPIC     Status: Abnormal   Collection Time   03/02/12  6:12 AM      Component Value Range Comment   Color, Urine YELLOW  YELLOW     APPearance CLEAR  CLEAR  Specific Gravity, Urine 1.020  1.005 - 1.030     pH 8.5 (*) 5.0 - 8.0     Glucose, UA NEGATIVE  NEGATIVE (mg/dL)    Hgb urine dipstick SMALL (*) NEGATIVE     Bilirubin Urine NEGATIVE  NEGATIVE     Ketones, ur NEGATIVE  NEGATIVE (mg/dL)    Protein, ur 30 (*) NEGATIVE (mg/dL)    Urobilinogen, UA 0.2  0.0 - 1.0 (mg/dL)    Nitrite NEGATIVE  NEGATIVE     Leukocytes, UA NEGATIVE  NEGATIVE    URINE MICROSCOPIC-ADD ON     Status: Abnormal    Collection Time   03/02/12  6:12 AM      Component Value Range Comment   Squamous Epithelial / LPF FEW (*) RARE     WBC, UA 3-6  <3 (WBC/hpf)    RBC / HPF 0-2  <3 (RBC/hpf)    Bacteria, UA RARE  RARE      Diagnostics:  Dg Chest 2 View  02/29/2012  *RADIOLOGY REPORT*  Clinical Data: Preop.  Follow-up possible CHF.  CHEST - 2 VIEW  Comparison: 09/17/2011  Findings: Prior CABG.  Heart is upper limits normal in size.  Lungs are clear.  No effusions or edema.  No acute bony abnormality. Right vascular stent is noted in the subclavian region.  Surgical clips in the left axilla.  IMPRESSION: No active cardiopulmonary disease.  Original Report Authenticated By: Cyndie Chime, M.D.   Ct Head Wo Contrast  03/01/2012  *RADIOLOGY REPORT*  Clinical Data: Weakness.  Hypertension.  Prior CVA.  Peripheral vascular disease.  Bladder cancer.  CT HEAD WITHOUT CONTRAST  Technique:  Contiguous axial images were obtained from the base of the skull through the vertex without contrast.  Comparison: 06/12/2010  Findings: The brain stem, cerebellum, cerebral peduncles, thalami, basal ganglia, basilar cisterns, and ventricular system appear unremarkable.  Periventricular and corona radiata white matter hypodensities are most compatible with chronic ischemic microvascular white matter disease.  No intracranial hemorrhage, mass lesion, or acute infarction is identified.  Minimal polypoid mucoperiosteal thickening noted in the maxillary sinuses.  Old right medial orbital wall fracture noted.  There is a small chronic erosion of the odontoid.  Atherosclerotic calcification of the carotid siphons noted.  IMPRESSION: 1.  Mild chronic paranasal sinusitis.  2. Periventricular and corona radiata white matter hypodensities are most compatible with chronic ischemic microvascular white matter disease.  3.  Atherosclerosis.  4.  Old right medial orbital wall fracture.  Original Report Authenticated By: Dellia Cloud, M.D.   Dg Chest  Portable 1 View  03/01/2012  *RADIOLOGY REPORT*  Clinical Data: Weakness, history of CABG and end-stage renal disease  PORTABLE CHEST - 1 VIEW  Comparison: Chest x-ray of 02/29/2012  Findings: No active infiltrate or effusion is seen.  Cardiomegaly is stable.  Median sternotomy sutures are noted.  IMPRESSION: Stable cardiomegaly.  No active lung disease.  Original Report Authenticated By: Juline Patch, M.D.   Ir Shuntogram/fistulagram Right  02/19/2012  *RADIOLOGY REPORT*  Clinical Data:  End-stage renal disease with existing right upper arm brachiocephalic AV fistula.  Status post prior angioplasty and stenting of the right cephalic vein in 4782.  New progressive right upper extremity edema.  DIALYSIS AV FISTULOGRAM  Comparison:  04/14/2008  Contrast:  35 ml Omnipaque-300  Fluoroscopy Time: 0.5 minutes.  Procedure:  The procedure, risks, benefits, and alternatives were explained to the patient.  Questions regarding the procedure were encouraged and answered.  The patient  understands and consents to the procedure.  The right arm native fistula was prepped with Betadine in a sterile fashion, and a sterile drape was applied covering the operative field.  A diagnostic fistulogram was performed via an 18 gauge angiocatheter introduced into venous outflow.  Venous drainage was assessed to the level of the central veins in the chest.  Proximal fistula was studied by reflux maneuver with temporary compression of venous outflow.  After the procedure, the angiocatheter was removed and hemostasis obtained with manual compression.  Complications: None  Findings:  Compared to the prior fistulogram, primary drainage of the AV fistula is now through a branch supplying the deep venous system.  The cephalic vein becomes occluded in the mid upper arm and is occluded over a long segment, including the previously placed central covered stent.  There is no visualized collateral reconstitution of the cephalic vein.  Multiple  collateral veins are present in the right upper arm and chest.  The axillary vein is open.  There is chronic occlusion of the entire subclavian vein with eventual collateral reconstitution of flow into the innominate vein and SVC.  Proximal fistula evaluation shows normal patency of the anastomosis with the brachial artery.  IMPRESSION: Extensive venous occlusions including a long segment of the cephalic vein and the entire right subclavian vein.  Multiple collateral veins are present.  Based on appearance of the venous occlusions, attempt at transcatheter recanalization was not performed due to extremely low likelihood of any success or benefit.  Recommend vascular surgical consultation for opinion regarding new access placement.  Original Report Authenticated By: Reola Calkins, M.D.   Procedures: None  EKG: Sinus tachycardia with occasional Premature ventricular complexes Abnormal QRS-T angle, consider primary T wave abnormality  Full Code   Hospital Course: See H&P for complete admission details. The patient is an 78 year old white male who had just been discharged from [facility]. He had a femoral AV graft inserted. He was brought to the emergency room with difficulty walking and confusion. He received 700 cc of IV fluid in the emergency room and was improved but still seemed confused. His heart rate initially was 114. Temperature 99.7. He was alert but confused. Bandages were present on his leg and no signs of infection were noted. White blood cell count normal.  The patient was placed on observation. He walked with nursing. Eventually, his confusion cleared. His gait is now back to baseline. His vital signs have been stable. He feels much better and can go home with outpatient followup with Dr. Arbie Cookey. He likely had a delirium related to the anesthesia.  Discharge Exam:  Blood pressure 147/60, pulse 101, temperature 98.4 F (36.9 C), temperature source Oral, resp. rate 16, height 5'  8" (1.727 m), weight 82.8 kg (182 lb 8.7 oz), SpO2 93.00%.  General: Alert. Oriented and appropriate. Eating breakfast. Lungs clear to auscultation bilaterally without wheezes rhonchi or rales Cardiovascular regular rate rhythm without murmurs gallops rubs Abdomen soft nontender nondistended Extremities no clubbing cyanosis or edema  Signed: Dent Plantz L 03/03/2012, 8:14 AM

## 2012-03-03 NOTE — Progress Notes (Signed)
Patient to be sent home with daughter today. has ambulated in hallway and says he feels great

## 2012-03-05 NOTE — Discharge Summary (Signed)
[facility] Discharge Summary   Patient ID:  Andrew [name] MRN: [id number] DOB/AGE: 76-Oct-1927 76 y.o.  Admit date: 02/29/2012 Discharge date: 03/05/2012 Date of Surgery: 02/29/2012 Surgeon: Surgeon(s): [name], MD  Admission Diagnosis: End Stage Renal Disease  Discharge Diagnoses:  End Stage Renal Disease  Secondary Diagnoses: Past Medical History  Diagnosis Date  . Secondary hyperparathyroidism   . Peripheral vascular disease   . Hyperlipidemia   . Duodenal ulcer     remote  . Gout STABLE  . ESRD (end stage renal disease) NEPHROLOGIST-   DR [name]    Hemodialysis; Hx of bleeding from aneurysms on AVF  . Arteriosclerotic cardiovascular disease (ASCVD) 1998    CABG-1998; h/o CHF  . Cerebrovascular disease REMOTE --  PER CHART--    NO RESIDUAL  . Neuropathy of foot COLD FEET  . Degenerative joint disease   . Gastroesophageal reflux disease   . History of bladder cancer   . Impaired hearing BILATERAL     ONLY WEARS LEFT HEARING AID  . Blood transfusion   . Anemia   . Renal cell carcinoma 2001    s/p right nephrectomy-2001; subsequent ESRD  . Hypertension     Procedure(s): INSERTION OF ARTERIOVENOUS (AV) GORE-TEX GRAFT THIGH  Discharged Condition: good  HPI: End-stage renal disease dialysis dependent.    INSERTION OF ARTERIOVENOUS (AV) GORE-TEX GRAFT THIGH (Right). Good thrill palpated.   Hospital Course:  Andrew [name] is a 78 y.o. male is S/P Right Procedure(s): INSERTION OF ARTERIOVENOUS (AV) GORE-TEX GRAFT THIGH Extubated: POD # 0 Post-op wounds clean, dry, intact or healing well Pt. Ambulating, voiding and taking PO diet without difficulty. Pt pain controlled with PO pain meds. Labs as below Complications:none  Consults:  Treatment Team:  Dyke Maes, MD  Significant Diagnostic Studies: CBC Lab Results  Component Value Date   WBC 5.1 03/02/2012   HGB 10.0* 03/02/2012   HCT 32.9* 03/02/2012   MCV 107.9* 03/02/2012   PLT  89* 03/02/2012    BMET    Component Value Date/Time   NA 136 03/02/2012 0431   K 4.8 03/02/2012 0431   K 6.0 03/23/2011 0912   CL 98 03/02/2012 0431   CO2 30 03/02/2012 0431   GLUCOSE 82 03/02/2012 0431   BUN 23 03/02/2012 0431   CREATININE 5.57* 03/02/2012 0431   CALCIUM 9.0 03/02/2012 0431   GFRNONAA 8* 03/02/2012 0431   GFRAA 10* 03/02/2012 0431   COAG Lab Results  Component Value Date   INR 1.2 05/04/2009   INR 1.1 12/10/2007     Disposition:  Discharge to :Home Discharge Orders    Future Appointments: Provider: Department: Dept Phone: Center:   03/26/2012 3:30 PM [name], MD [facility] [phone] [facility]     Future Orders Please Complete By Expires   Resume previous diet      Driving Restrictions      Comments:   No driving for 1 weeks   Lifting restrictions      Comments:   No lifting for 4 weeks   Call MD for:  temperature >100.5      Call MD for:  redness, tenderness, or signs of infection (pain, swelling, bleeding, redness, odor or green/yellow discharge around incision site)      Call MD for:  severe or increased pain, loss or decreased feeling  in affected limb(s)         [place], [place]  Home Medication Instructions ONG:[id number]   Printed on:03/05/12  1221  Medication Information                    B Complex-C-Zn-Folic Acid (DIALYVITE 800/ZINC PO) Take 1 tablet by mouth daily.            atorvastatin (LIPITOR) 10 MG tablet Take 10 mg by mouth daily.            polyethylene glycol powder (MIRALAX) powder Take 17 g by mouth daily.            sevelamer (RENAGEL) 800 MG tablet Take 1,600-3,200 mg by mouth 4 (four) times daily. Patient takes 4 tablets with meals and 2 tablets with snacks           cinacalcet (SENSIPAR) 30 MG tablet Take 30 mg by mouth daily.            calcium carbonate (TUMS - DOSED IN MG ELEMENTAL CALCIUM) 500 MG chewable tablet Chew 1 tablet by mouth daily as needed. For upset stomach           omeprazole (PRILOSEC) 40 MG capsule Take 40 mg  by mouth at bedtime as needed. For reflux           acetaminophen (TYLENOL) 500 MG tablet Take 1,000 mg by mouth every 6 (six) hours as needed. For pain/headache           OVER THE COUNTER MEDICATION Apply 1 application topically daily as needed. For dry feet  otc healing balm           allopurinol (ZYLOPRIM) 100 MG tablet Take 200 mg by mouth daily.            gabapentin (NEURONTIN) 100 MG capsule Take 100 mg by mouth 2 (two) times daily.           oxyCODONE-acetaminophen (ROXICET) 5-325 MG per tablet Take 1 tablet by mouth every 6 (six) hours as needed for pain.            Verbal and written Discharge instructions given to the patient. Wound care per Discharge AVS Follow-up Information    Follow up with EARLY, TODD, MD in 4 weeks. (sent)    Contact information:   [address] [place] [address] [phone]          Signed: Clinton Gallant [facility] 03/05/2012, 12:21 PM

## 2012-03-07 NOTE — Progress Notes (Signed)
UR Chart Review Completed  

## 2012-03-10 ENCOUNTER — Emergency Department (HOSPITAL_COMMUNITY): Payer: Medicare Other

## 2012-03-10 ENCOUNTER — Inpatient Hospital Stay (HOSPITAL_COMMUNITY)
Admission: EM | Admit: 2012-03-10 | Discharge: 2012-03-13 | DRG: 291 | Disposition: A | Discharge status: Home / Self Care | Payer: Medicare Other | Attending: Internal Medicine | Admitting: Internal Medicine

## 2012-03-10 ENCOUNTER — Encounter (HOSPITAL_COMMUNITY): Payer: Self-pay

## 2012-03-10 DIAGNOSIS — E875 Hyperkalemia: Secondary | ICD-10-CM | POA: Diagnosis present

## 2012-03-10 DIAGNOSIS — Z79899 Other long term (current) drug therapy: Secondary | ICD-10-CM

## 2012-03-10 DIAGNOSIS — Z8551 Personal history of malignant neoplasm of bladder: Secondary | ICD-10-CM

## 2012-03-10 DIAGNOSIS — N039 Chronic nephritic syndrome with unspecified morphologic changes: Secondary | ICD-10-CM | POA: Diagnosis present

## 2012-03-10 DIAGNOSIS — I120 Hypertensive chronic kidney disease with stage 5 chronic kidney disease or end stage renal disease: Secondary | ICD-10-CM | POA: Diagnosis present

## 2012-03-10 DIAGNOSIS — N186 End stage renal disease: Secondary | ICD-10-CM | POA: Diagnosis present

## 2012-03-10 DIAGNOSIS — I10 Essential (primary) hypertension: Secondary | ICD-10-CM | POA: Diagnosis present

## 2012-03-10 DIAGNOSIS — I739 Peripheral vascular disease, unspecified: Secondary | ICD-10-CM | POA: Diagnosis present

## 2012-03-10 DIAGNOSIS — I252 Old myocardial infarction: Secondary | ICD-10-CM

## 2012-03-10 DIAGNOSIS — D649 Anemia, unspecified: Secondary | ICD-10-CM | POA: Diagnosis present

## 2012-03-10 DIAGNOSIS — D630 Anemia in neoplastic disease: Secondary | ICD-10-CM | POA: Diagnosis present

## 2012-03-10 DIAGNOSIS — I5031 Acute diastolic (congestive) heart failure: Principal | ICD-10-CM | POA: Diagnosis present

## 2012-03-10 DIAGNOSIS — H9190 Unspecified hearing loss, unspecified ear: Secondary | ICD-10-CM | POA: Diagnosis present

## 2012-03-10 DIAGNOSIS — I2510 Atherosclerotic heart disease of native coronary artery without angina pectoris: Secondary | ICD-10-CM | POA: Diagnosis present

## 2012-03-10 DIAGNOSIS — M109 Gout, unspecified: Secondary | ICD-10-CM | POA: Diagnosis present

## 2012-03-10 DIAGNOSIS — Z992 Dependence on renal dialysis: Secondary | ICD-10-CM

## 2012-03-10 DIAGNOSIS — K219 Gastro-esophageal reflux disease without esophagitis: Secondary | ICD-10-CM | POA: Diagnosis present

## 2012-03-10 DIAGNOSIS — E785 Hyperlipidemia, unspecified: Secondary | ICD-10-CM | POA: Diagnosis present

## 2012-03-10 DIAGNOSIS — Z85528 Personal history of other malignant neoplasm of kidney: Secondary | ICD-10-CM

## 2012-03-10 DIAGNOSIS — Z905 Acquired absence of kidney: Secondary | ICD-10-CM

## 2012-03-10 DIAGNOSIS — N2581 Secondary hyperparathyroidism of renal origin: Secondary | ICD-10-CM | POA: Diagnosis present

## 2012-03-10 DIAGNOSIS — M1990 Unspecified osteoarthritis, unspecified site: Secondary | ICD-10-CM | POA: Diagnosis present

## 2012-03-10 DIAGNOSIS — I509 Heart failure, unspecified: Secondary | ICD-10-CM | POA: Diagnosis present

## 2012-03-10 DIAGNOSIS — Z7982 Long term (current) use of aspirin: Secondary | ICD-10-CM

## 2012-03-10 DIAGNOSIS — D631 Anemia in chronic kidney disease: Secondary | ICD-10-CM | POA: Diagnosis present

## 2012-03-10 DIAGNOSIS — Z951 Presence of aortocoronary bypass graft: Secondary | ICD-10-CM

## 2012-03-10 DIAGNOSIS — R079 Chest pain, unspecified: Secondary | ICD-10-CM | POA: Diagnosis present

## 2012-03-10 DIAGNOSIS — R55 Syncope and collapse: Secondary | ICD-10-CM

## 2012-03-10 LAB — CARDIAC PANEL(CRET KIN+CKTOT+MB+TROPI)
CK, MB: 2.5 ng/mL (ref 0.3–4.0)
CK, MB: 2.5 ng/mL (ref 0.3–4.0)
Relative Index: INVALID (ref 0.0–2.5)
Relative Index: INVALID (ref 0.0–2.5)
Total CK: 65 U/L (ref 7–232)
Troponin I: <0.3 ng/mL (ref <0.30)

## 2012-03-10 LAB — POCT I-STAT, CHEM 8
Calcium, Ion: 1.04 mmol/L — ABNORMAL LOW (ref 1.12–1.32)
Chloride: 98 mEq/L (ref 96–112)
Glucose, Bld: 99 mg/dL (ref 70–99)
HCT: 30 % — ABNORMAL LOW (ref 39.0–52.0)
TCO2: 31 mmol/L (ref 0–100)

## 2012-03-10 LAB — DIFFERENTIAL
Lymphocytes Relative: 25 % (ref 12–46)
Lymphs Abs: 1 10*3/uL (ref 0.7–4.0)
Monocytes Relative: 9 % (ref 3–12)
Neutro Abs: 2.2 10*3/uL (ref 1.7–7.7)
Neutrophils Relative %: 56 % (ref 43–77)

## 2012-03-10 LAB — BASIC METABOLIC PANEL
BUN: 39 mg/dL — ABNORMAL HIGH (ref 6–23)
CO2: 33 mEq/L — ABNORMAL HIGH (ref 19–32)
Chloride: 92 mEq/L — ABNORMAL LOW (ref 96–112)
Glucose, Bld: 101 mg/dL — ABNORMAL HIGH (ref 70–99)
Potassium: 5.5 mEq/L — ABNORMAL HIGH (ref 3.5–5.1)
Sodium: 134 mEq/L — ABNORMAL LOW (ref 135–145)

## 2012-03-10 LAB — D-DIMER, QUANTITATIVE: D-Dimer, Quant: 6.3 ug/mL-FEU — ABNORMAL HIGH (ref 0.00–0.48)

## 2012-03-10 LAB — CBC
Hemoglobin: 9.6 g/dL — ABNORMAL LOW (ref 13.0–17.0)
MCH: 32.4 pg (ref 26.0–34.0)
Platelets: 152 10*3/uL (ref 150–400)
RBC: 2.96 MIL/uL — ABNORMAL LOW (ref 4.22–5.81)
WBC: 4 10*3/uL (ref 4.0–10.5)

## 2012-03-10 LAB — PROTIME-INR: INR: 1.17 (ref 0.00–1.49)

## 2012-03-10 MED ORDER — SEVELAMER HCL 800 MG PO TABS
1600.0000 mg | ORAL_TABLET | Freq: Four times a day (QID) | ORAL | Status: DC
  Filled 2012-03-10 (×4): qty 4

## 2012-03-10 MED ORDER — ASPIRIN EC 325 MG PO TBEC
325.0000 mg | DELAYED_RELEASE_TABLET | Freq: Every day | ORAL | Status: DC
  Administered 2012-03-11 – 2012-03-13 (×3): 325 mg via ORAL
  Filled 2012-03-10 (×3): qty 1

## 2012-03-10 MED ORDER — SODIUM CHLORIDE 0.9 % IJ SOLN
3.0000 mL | Freq: Two times a day (BID) | INTRAMUSCULAR | Status: DC
  Administered 2012-03-10 – 2012-03-13 (×6): 3 mL via INTRAVENOUS
  Filled 2012-03-10 (×6): qty 3

## 2012-03-10 MED ORDER — GABAPENTIN 100 MG PO CAPS
100.0000 mg | ORAL_CAPSULE | Freq: Two times a day (BID) | ORAL | Status: DC
  Administered 2012-03-10 – 2012-03-12 (×5): 100 mg via ORAL
  Filled 2012-03-10 (×5): qty 1

## 2012-03-10 MED ORDER — ALLOPURINOL 100 MG PO TABS
200.0000 mg | ORAL_TABLET | Freq: Every day | ORAL | Status: DC
  Administered 2012-03-10 – 2012-03-13 (×4): 200 mg via ORAL
  Filled 2012-03-10 (×2): qty 2
  Filled 2012-03-10: qty 1
  Filled 2012-03-10: qty 2

## 2012-03-10 MED ORDER — ENOXAPARIN SODIUM 30 MG/0.3ML ~~LOC~~ SOLN
30.0000 mg | SUBCUTANEOUS | Status: DC
  Administered 2012-03-10 – 2012-03-12 (×3): 30 mg via SUBCUTANEOUS
  Filled 2012-03-10 (×3): qty 0.3

## 2012-03-10 MED ORDER — ACETAMINOPHEN 325 MG PO TABS: 650.0000 mg | ORAL_TABLET | Freq: Four times a day (QID) | ORAL | Status: DC | PRN

## 2012-03-10 MED ORDER — POLYETHYLENE GLYCOL 3350 17 GM/SCOOP PO POWD
17.0000 g | Freq: Every day | ORAL | Status: DC
  Filled 2012-03-10: qty 255

## 2012-03-10 MED ORDER — ASPIRIN 81 MG PO CHEW
324.0000 mg | CHEWABLE_TABLET | Freq: Once | ORAL | Status: AC
  Administered 2012-03-10: 324 mg via ORAL
  Filled 2012-03-10: qty 4

## 2012-03-10 MED ORDER — ACETAMINOPHEN 650 MG RE SUPP: 650.0000 mg | Freq: Four times a day (QID) | RECTAL | Status: DC | PRN

## 2012-03-10 MED ORDER — ATORVASTATIN CALCIUM 10 MG PO TABS
10.0000 mg | ORAL_TABLET | Freq: Every day | ORAL | Status: DC
  Administered 2012-03-10 – 2012-03-13 (×4): 10 mg via ORAL
  Filled 2012-03-10 (×4): qty 1

## 2012-03-10 MED ORDER — DOCUSATE SODIUM 100 MG PO CAPS
100.0000 mg | ORAL_CAPSULE | Freq: Two times a day (BID) | ORAL | Status: DC
  Administered 2012-03-10 – 2012-03-12 (×4): 100 mg via ORAL
  Filled 2012-03-10 (×5): qty 1

## 2012-03-10 MED ORDER — ALBUTEROL SULFATE HFA 108 (90 BASE) MCG/ACT IN AERS
2.0000 | INHALATION_SPRAY | Freq: Four times a day (QID) | RESPIRATORY_TRACT | Status: DC
  Administered 2012-03-10 – 2012-03-11 (×2): 2 via RESPIRATORY_TRACT
  Filled 2012-03-10: qty 6.7

## 2012-03-10 MED ORDER — ONDANSETRON HCL 4 MG/2ML IJ SOLN: 4.0000 mg | Freq: Four times a day (QID) | INTRAMUSCULAR | Status: DC | PRN

## 2012-03-10 MED ORDER — MORPHINE SULFATE 2 MG/ML IJ SOLN: 1.0000 mg | INTRAMUSCULAR | Status: DC | PRN

## 2012-03-10 MED ORDER — NITROGLYCERIN 0.4 MG SL SUBL: 0.4000 mg | SUBLINGUAL_TABLET | SUBLINGUAL | Status: DC | PRN

## 2012-03-10 MED ORDER — ONDANSETRON HCL 4 MG/2ML IJ SOLN: 4.0000 mg | Freq: Three times a day (TID) | INTRAMUSCULAR | Status: DC | PRN

## 2012-03-10 MED ORDER — CINACALCET HCL 30 MG PO TABS
30.0000 mg | ORAL_TABLET | Freq: Every day | ORAL | Status: DC
  Administered 2012-03-11 – 2012-03-13 (×3): 30 mg via ORAL
  Filled 2012-03-10 (×6): qty 1

## 2012-03-10 MED ORDER — DIALYVITE 800/ZINC 0.8 MG PO TABS
1.0000 | ORAL_TABLET | Freq: Every day | ORAL | Status: DC
  Filled 2012-03-10: qty 1

## 2012-03-10 MED ORDER — ONDANSETRON HCL 4 MG PO TABS: 4.0000 mg | ORAL_TABLET | Freq: Four times a day (QID) | ORAL | Status: DC | PRN

## 2012-03-10 MED ORDER — SODIUM POLYSTYRENE SULFONATE 15 GM/60ML PO SUSP
30.0000 g | Freq: Once | ORAL | Status: AC
  Administered 2012-03-10: 30 g via ORAL
  Filled 2012-03-10: qty 60

## 2012-03-10 MED ORDER — PANTOPRAZOLE SODIUM 40 MG PO TBEC
40.0000 mg | DELAYED_RELEASE_TABLET | Freq: Every day | ORAL | Status: DC
  Administered 2012-03-10 – 2012-03-13 (×4): 40 mg via ORAL
  Filled 2012-03-10 (×4): qty 1

## 2012-03-10 MED ORDER — IOHEXOL 350 MG/ML SOLN
100.0000 mL | Freq: Once | INTRAVENOUS | Status: AC | PRN
  Administered 2012-03-10: 100 mL via INTRAVENOUS

## 2012-03-10 NOTE — ED Notes (Signed)
Family at bedside. Patient does not need anything at this time. 

## 2012-03-10 NOTE — H&P (Signed)
PCP:   [name]., MD, MD   Chief Complaint:  Chest pain  HPI: This is 78 year old gentleman with history of end-stage renal disease on hemodialysis who was recently in the hospital he was found to be delirious. It was found that his encephalopathy at that time was likely secondary to recent anesthesia he received for his AV graft placement. Patient was discharged home in stable condition. He returns to the emergency room today with complaints of chest pain. He reports this as a pressure tightness comfort her current in the substernal area. He describes it as nonradiating, worse with exertion. He has not noticed any relation to food. Denies any nausea, vomiting, diaphoresis. He also does feel short of breath which is also worse with exertion. This may be more of a chronic issue. He reports onset of symptoms a few days ago. These have been intermittent. He reports his symptoms occurring with minimal exertion, he had them when he walked to the bathroom. Although yesterday he went shopping and did not have any recurrence of symptoms. He reports minimal cough, no fever. He reports that he may be developing a cold. He has not noted that his pain worsens with deep breath, coughing. The patient does have a history of coronary artery disease without previous CABG surgery done 15 years ago. He has not had any recurrence since then. He's not had any recent cardiac testing. He's been referred for admission.  Allergies:  No Known Allergies    Past Medical History  Diagnosis Date  . Secondary hyperparathyroidism   . Peripheral vascular disease   . Hyperlipidemia   . Duodenal ulcer     remote  . Gout STABLE  . ESRD (end stage renal disease) NEPHROLOGIST-   DR [name]    Hemodialysis; Hx of bleeding from aneurysms on AVF  . Arteriosclerotic cardiovascular disease (ASCVD) 1998    CABG-1998; h/o CHF  . Cerebrovascular disease REMOTE --  PER CHART--    NO RESIDUAL  . Neuropathy of foot COLD FEET  .  Degenerative joint disease   . Gastroesophageal reflux disease   . History of bladder cancer   . Impaired hearing BILATERAL     ONLY WEARS LEFT HEARING AID  . Blood transfusion   . Anemia   . Renal cell carcinoma 2001    s/p right nephrectomy-2001; subsequent ESRD  . Hypertension     Past Surgical History  Procedure Date  . Right nephrectomy 2001    Renal cell carcinoma  . Colonoscopy 01/24/2011    prominent vascular pattern, suboptimal prep but doable  . Esophagogastroduodenoscopy 01/24/2011    mild erosive reflux esophagitis, bulbar/antral erosions, bx from antrum benign  . Av fistula repair 2008    revision of anastomosis of Right AVF  . Arteriovenous graft placement 2006    Thrombectomy and interposition jump graft revision to higher axillary vein of LUA AVG  . Coronary artery bypass graft 1998    X4 VESSELS  . Multiple cysto/ resection tumor's with bx's LAST ONE 05-09-2011  . Multiple surg's / interventions for avgg/ fistula right upper arm LAST REVISION 06-29-2011    (JUNE 2010 STENT CEPHALIC VEIN/ 03-23-2011 DILATATION ANGIOPLASTY)  . Left ptc left renal artery   . Cardiac catheterization 2004  . Cataract extraction w/ intraocular lens  implant, bilateral   . Cystoscopy w/ retrogrades 12/28/2011    Procedure: CYSTOSCOPY WITH RETROGRADE PYELOGRAM;  Surgeon: [name], MD;  Location: [facility];  Service: Urology;  Laterality: Left;  C-ARM   . Transurethral resection of bladder tumor 12/28/2011    Procedure: TRANSURETHRAL RESECTION OF BLADDER TUMOR (TURBT);  Surgeon: [name], MD;  Location: [facility];  Service: Urology;  Laterality: N/A;  . Av fistula placement 02/29/2012    Procedure: INSERTION OF ARTERIOVENOUS (AV) GORE-TEX GRAFT THIGH;  Surgeon: [name], MD;  Location: [facility] OR;  Service: Vascular;  Laterality: Right;  Insertion right femoral arteriovenous gortex graft    Prior to Admission medications   Medication Sig Start Date  End Date Taking? Authorizing Provider  acetaminophen (TYLENOL) 500 MG tablet Take 1,000 mg by mouth every 6 (six) hours as needed. For pain/headache   Yes Historical Provider, MD  allopurinol (ZYLOPRIM) 100 MG tablet Take 200 mg by mouth daily.  09/19/11  Yes Rhetta Mura, MD  aspirin EC 81 MG tablet Take 81 mg by mouth daily.   Yes Historical Provider, MD  atorvastatin (LIPITOR) 10 MG tablet Take 10 mg by mouth daily.    Yes Historical Provider, MD  B Complex-C-Zn-Folic Acid (DIALYVITE 800/ZINC PO) Take 1 tablet by mouth daily.    Yes Historical Provider, MD  calcium carbonate (TUMS - DOSED IN MG ELEMENTAL CALCIUM) 500 MG chewable tablet Chew 1 tablet by mouth daily as needed. For upset stomach   Yes Historical Provider, MD  cinacalcet (SENSIPAR) 30 MG tablet Take 30 mg by mouth daily.    Yes Historical Provider, MD  colchicine (COLCRYS) 0.6 MG tablet Take 0.6 mg by mouth 2 (two) times daily. Prn for gout   Yes Historical Provider, MD  docusate sodium (COLACE) 100 MG capsule Take 100 mg by mouth 2 (two) times daily.   Yes Historical Provider, MD  gabapentin (NEURONTIN) 100 MG capsule Take 100 mg by mouth 2 (two) times daily. 01/09/12 01/08/13 Yes Kathlen Brunswick, MD  omeprazole (PRILOSEC) 40 MG capsule Take 40 mg by mouth at bedtime as needed. For reflux   Yes Historical Provider, MD  OVER THE COUNTER MEDICATION Apply 1 application topically daily as needed. For dry feet  otc healing balm   Yes Historical Provider, MD  polyethylene glycol powder (MIRALAX) powder Take 17 g by mouth daily.    Yes Historical Provider, MD  sevelamer (RENAGEL) 800 MG tablet Take 1,600-3,200 mg by mouth 4 (four) times daily. Patient takes 4 tablets with meals and 2 tablets with snacks   Yes Historical Provider, MD    Social History:  reports that he quit smoking about 28 years ago. His smoking use included Cigarettes. He has a 25 pack-year smoking history. He has quit using smokeless tobacco. He reports that he  does not drink alcohol or use illicit drugs.  Family History  Problem Relation Age of Onset  . Colon cancer Neg Hx   . Liver disease Neg Hx   . Heart disease Father     Review of Systems: Positives in bold Constitutional: Denies fever, chills, diaphoresis, appetite change and fatigue.  HEENT: Denies photophobia, eye pain, redness, hearing loss, ear pain, congestion, sore throat, rhinorrhea, sneezing, mouth sores, trouble swallowing, neck pain, neck stiffness and tinnitus.   Respiratory: Denies SOB, DOE, cough, chest tightness,  and wheezing.   Cardiovascular: Denies chest pain, palpitations and leg swelling.  Gastrointestinal: Denies nausea, vomiting, abdominal pain, diarrhea, constipation, blood in stool and abdominal distention.  Genitourinary: Denies dysuria, urgency, frequency, hematuria, flank pain and difficulty urinating.  Musculoskeletal: Denies myalgias, back pain, joint swelling, arthralgias and gait problem.  Skin: Denies pallor, rash  and wound.  Neurological: Denies dizziness, seizures, syncope, weakness, light-headedness, numbness and headaches.  Hematological: Denies adenopathy. Easy bruising, personal or family bleeding history  Psychiatric/Behavioral: Denies suicidal ideation, mood changes, confusion, nervousness, sleep disturbance and agitation   Physical Exam: Blood pressure 137/78, pulse 93, temperature 98.4 F (36.9 C), temperature source Oral, resp. rate 18, height 5\' 8"  (1.727 m), weight 81.1 kg (178 lb 12.7 oz), SpO2 98.00%. General: Patient is lying in bed, does not appear to be in any acute distress, alert and oriented x3 HEENT: Normocephalic, atraumatic, pupils are equal round reactive to light, mucous membranes are dry Neck: Supple Chest: Clear to auscultation bilaterally Cardiac: S1, S2, regular rate and rhythm Abdomen: Soft, nontender, nondistended, bowel sounds are active Extremities: Chronic right upper extremity swelling, no pedal edema  bilaterally. Neurologic: Grossly intact, nonfocal Skin: Intact, no visible rashes.  Labs on Admission:  Results for orders placed during the hospital encounter of 03/10/12 (from the past 48 hour(s))  CBC     Status: Abnormal   Collection Time   03/10/12  2:36 PM      Component Value Range Comment   WBC 4.0  4.0 - 10.5 (K/uL)    RBC 2.96 (*) 4.22 - 5.81 (MIL/uL)    Hemoglobin 9.6 (*) 13.0 - 17.0 (g/dL)    HCT 16.1 (*) 09.6 - 52.0 (%)    MCV 106.4 (*) 78.0 - 100.0 (fL)    MCH 32.4  26.0 - 34.0 (pg)    MCHC 30.5  30.0 - 36.0 (g/dL)    RDW 04.5 (*) 40.9 - 15.5 (%)    Platelets 152  150 - 400 (K/uL)   DIFFERENTIAL     Status: Abnormal   Collection Time   03/10/12  2:36 PM      Component Value Range Comment   Neutrophils Relative 56  43 - 77 (%)    Neutro Abs 2.2  1.7 - 7.7 (K/uL)    Lymphocytes Relative 25  12 - 46 (%)    Lymphs Abs 1.0  0.7 - 4.0 (K/uL)    Monocytes Relative 9  3 - 12 (%)    Monocytes Absolute 0.4  0.1 - 1.0 (K/uL)    Eosinophils Relative 9 (*) 0 - 5 (%)    Eosinophils Absolute 0.4  0.0 - 0.7 (K/uL)    Basophils Relative 1  0 - 1 (%)    Basophils Absolute 0.0  0.0 - 0.1 (K/uL)   BASIC METABOLIC PANEL     Status: Abnormal   Collection Time   03/10/12  2:36 PM      Component Value Range Comment   Sodium 134 (*) 135 - 145 (mEq/L)    Potassium 5.5 (*) 3.5 - 5.1 (mEq/L)    Chloride 92 (*) 96 - 112 (mEq/L)    CO2 33 (*) 19 - 32 (mEq/L)    Glucose, Bld 101 (*) 70 - 99 (mg/dL)    BUN 39 (*) 6 - 23 (mg/dL)    Creatinine, Ser 8.11 (*) 0.50 - 1.35 (mg/dL)    Calcium 9.1  8.4 - 10.5 (mg/dL)    GFR calc non Af Amer 5 (*) >90 (mL/min)    GFR calc Af Amer 6 (*) >90 (mL/min)   PROTIME-INR     Status: Normal   Collection Time   03/10/12  2:36 PM      Component Value Range Comment   Prothrombin Time 15.1  11.6 - 15.2 (seconds)    INR 1.17  0.00 - 1.49  CARDIAC PANEL(CRET KIN+CKTOT+MB+TROPI)     Status: Normal   Collection Time   03/10/12  2:36 PM      Component Value  Range Comment   Total CK 65  7 - 232 (U/L)    CK, MB 2.5  0.3 - 4.0 (ng/mL)    Troponin I <0.30  <0.30 (ng/mL)    Relative Index RELATIVE INDEX IS INVALID  0.0 - 2.5    D-DIMER, QUANTITATIVE     Status: Abnormal   Collection Time   03/10/12  2:36 PM      Component Value Range Comment   D-Dimer, Quant 6.30 (*) 0.00 - 0.48 (ug/mL-FEU)   POCT I-STAT, CHEM 8     Status: Abnormal   Collection Time   03/10/12  2:39 PM      Component Value Range Comment   Sodium 137  135 - 145 (mEq/L)    Potassium 5.5 (*) 3.5 - 5.1 (mEq/L)    Chloride 98  96 - 112 (mEq/L)    BUN 39 (*) 6 - 23 (mg/dL)    Creatinine, Ser 1.61 (*) 0.50 - 1.35 (mg/dL)    Glucose, Bld 99  70 - 99 (mg/dL)    Calcium, Ion 0.96 (*) 1.12 - 1.32 (mmol/L)    TCO2 31  0 - 100 (mmol/L)    Hemoglobin 10.2 (*) 13.0 - 17.0 (g/dL)    HCT 04.5 (*) 40.9 - 52.0 (%)     Radiological Exams on Admission: Dg Chest 2 View  03/10/2012  *RADIOLOGY REPORT*  Clinical Data: Shortness of breath.  Wheezing.  CHEST - 2 VIEW  Comparison: 03/01/2012 and 02/29/2012.  Findings: The heart size and mediastinal contours are stable status post CABG.  Right axillary vascular stent is noted.  Compared with prior examinations, there is increased interstitial prominence at both lung bases with mild blunting of the posterior costophrenic angles on the lateral view.  There is no confluent airspace opacity or pneumothorax.  IMPRESSION: Increased interstitial prominence at both lung bases and possible small pleural effusions, suggesting mild edema or atypical infection.  No consolidation.  Original Report Authenticated By: Gerrianne Scale, M.D.   Ct Angio Chest W/cm &/or Wo Cm  03/10/2012  *RADIOLOGY REPORT*  Clinical Data: Chest pain with exertion.  Chronic hemodialysis. History of bladder and renal cell carcinoma.  CT ANGIOGRAPHY CHEST  Technique:  Multidetector CT imaging of the chest using the standard protocol during bolus administration of intravenous contrast.  Multiplanar reconstructed images including MIPs were obtained and reviewed to evaluate the vascular anatomy.  Contrast: OMNIPAQUE IOHEXOL 350 MG/ML SOLN  Comparison: None.  Findings: Pulmonary arterial opacification is satisfactory, although not optimal.  There is no evidence of acute pulmonary embolism.  There is diffuse atherosclerosis of the great vessels, aorta and coronary artery status post CABG.  There is a right subclavian venous stent.  There are scattered prominent mediastinal and hilar lymph nodes. There is a 1 cm AP window node on image 31 and a 1.5 cm subcarinal node on image 39.  There appears to be mild diffuse esophageal wall thickening.  There are small dependent pleural effusions bilaterally.  No pericardial effusion is seen. There is a 1 cm right thyroid nodule on image 11.  The lungs demonstrate mild central airway thickening and dependent atelectasis.  There is no confluent airspace opacity or endobronchial lesion.  The visualized upper abdomen appears unremarkable.  IMPRESSION:  1.  No evidence of acute pulmonary embolism. 2.  Small bilateral pleural effusions  and mild central airway thickening.  No confluent airspace opacity. 3.  Nonspecific mildly prominent mediastinal and hilar lymph nodes. 4.  Suggested mild diffuse esophageal wall thickening.  Original Report Authenticated By: Gerrianne Scale, M.D.    Assessment/Plan Principal Problem:  *Chest pain on exertion Active Problems:  SECONDARY HYPERPARATHYROIDISM  ANEMIA  Hypertension  Hyperlipidemia  ESRD (end stage renal disease)  Arteriosclerotic cardiovascular disease (ASCVD)  Hyperkalemia  Plan:  #1 chest pain patient's pain has both typical and atypical features. We will monitor him on telemetry, check cardiac markers every 8 hours x3. His initial EKG and cardiac markers are reassuring. We will continue him on aspirin. We will request a cardiology consultation in the morning to see if he needs an inpatient stress  test. He has already had a CT angiogram done in the emergency room which was negative for pulmonary embolism. Will also check HgbA1c and lipid panel.  #2. End-stage renal disease. [facility] consult nephrology for hemodialysis needs. He has not missed any dialysis treatments and his next scheduled treatment is tomorrow.  #3. Hypertension. This is stable.  #4. Hyperkalemia. Mild. We will give one dose of Kayexalate and repeat bmet  in the morning.  #5. Anemia. Likely due to end-stage renal disease. Continue to follow. No signs of bleeding.  #6.  If no further inpatient  Cardiac work up is necessary, then patient can likely be discharged home tomorrow after dialysis.  Time Spent on Admission:  Jasmain Ahlberg [facility] Pager: [phone] 03/10/2012, 5:37 PM

## 2012-03-10 NOTE — ED Notes (Signed)
Pt states he had some SOB yesterday but it became worse this morning when he got up. Also, complain of " discomfort " in the center of his chest.

## 2012-03-10 NOTE — ED Notes (Signed)
Pt c/o feeling short of breath and exertional chest pain at times. Pt alert and oriented x 3. Skin warm and dry. Color pink. Pt has dialysis grafts present in his right upper arm and right thigh with positive thrill and bruit noted. Pt showing SR with occasional unifocal PVC's on cardiac monitor.

## 2012-03-10 NOTE — ED Notes (Signed)
Family at bedside. 

## 2012-03-10 NOTE — ED Provider Notes (Signed)
History   This chart was scribed for Glynn Octave, MD scribed by Magnus Sinning. The patient was seen in room APA02/APA02 seen at 14:09.   CSN: [id number]  Arrival date & time 03/10/12  1358   First MD Initiated Contact with Patient 03/10/12 1403      Chief Complaint  Patient presents with  . Shortness of Breath    (Consider location/radiation/quality/duration/timing/severity/associated sxs/prior treatment) HPI Andrew [name] is a 78 y.o. male who presents to the Emergency Department complaining of constant moderate tight chest pain with extertion, onset this morning, with associated mild cough, mildly tender abd ,little urine output, and SOB with extertion. Relative concurs that pt was c/o SOB, with chest tightness when getting up and walking around about an hour ago.  Pt says he's had similar chest tighteness in past, but not same severity. Pt is on dialysis and has a fistula in his arm and right leg, which recently placed 10 days ago. Relative says following, he was seen for suspected drowsiness from anesthesia or possible dehydration and he was admitted. Pt was d/c 2 days later and has since maintained dialysis appts with the last being 3 days ago. Next appt tomorrow. Denies fevers, vomiting, performing recent stress test , or catherizations. Hx of bypass 12 years ago.  Cardiologist: Dr. Dietrich Pates Nephrologist: Dr. Lowell Guitar Past Medical History  Diagnosis Date  . Secondary hyperparathyroidism   . Peripheral vascular disease   . Hyperlipidemia   . Duodenal ulcer     remote  . Gout STABLE  . ESRD (end stage renal disease) NEPHROLOGIST-   DR [name]    Hemodialysis; Hx of bleeding from aneurysms on AVF  . Arteriosclerotic cardiovascular disease (ASCVD) 1998    CABG-1998; h/o CHF  . Cerebrovascular disease REMOTE --  PER CHART--    NO RESIDUAL  . Neuropathy of foot COLD FEET  . Degenerative joint disease   . Gastroesophageal reflux disease   . History of bladder cancer   . Impaired  hearing BILATERAL     ONLY WEARS LEFT HEARING AID  . Blood transfusion   . Anemia   . Renal cell carcinoma 2001    s/p right nephrectomy-2001; subsequent ESRD  . Hypertension     Past Surgical History  Procedure Date  . Right nephrectomy 2001    Renal cell carcinoma  . Colonoscopy 01/24/2011    prominent vascular pattern, suboptimal prep but doable  . Esophagogastroduodenoscopy 01/24/2011    mild erosive reflux esophagitis, bulbar/antral erosions, bx from antrum benign  . Av fistula repair 2008    revision of anastomosis of Right AVF  . Arteriovenous graft placement 2006    Thrombectomy and interposition jump graft revision to higher axillary vein of LUA AVG  . Coronary artery bypass graft 1998    X4 VESSELS  . Multiple cysto/ resection tumor's with bx's LAST ONE 05-09-2011  . Multiple surg's / interventions for avgg/ fistula right upper arm LAST REVISION 06-29-2011    (JUNE 2010 STENT CEPHALIC VEIN/ 03-23-2011 DILATATION ANGIOPLASTY)  . Left ptc left renal artery   . Cardiac catheterization 2004  . Cataract extraction w/ intraocular lens  implant, bilateral   . Cystoscopy w/ retrogrades 12/28/2011    Procedure: CYSTOSCOPY WITH RETROGRADE PYELOGRAM;  Surgeon: [name], MD;  Location: [facility];  Service: Urology;  Laterality: Left;  C-ARM   . Transurethral resection of bladder tumor 12/28/2011    Procedure: TRANSURETHRAL RESECTION OF BLADDER TUMOR (TURBT);  Surgeon: [name], MD;  Location: [facility];  Service: Urology;  Laterality: N/A;  . Av fistula placement 02/29/2012    Procedure: INSERTION OF ARTERIOVENOUS (AV) GORE-TEX GRAFT THIGH;  Surgeon: [name], MD;  Location: [facility] OR;  Service: Vascular;  Laterality: Right;  Insertion right femoral arteriovenous gortex graft    Family History  Problem Relation Age of Onset  . Colon cancer Neg Hx   . Liver disease Neg Hx   . Heart disease Father     History  Substance Use Topics  .  Smoking status: Former Smoker -- 1.0 packs/day for 25 years    Types: Cigarettes    Quit date: 02/26/1984  . Smokeless tobacco: Former Neurosurgeon   Comment: quit 1985  . Alcohol Use: No      Review of Systems  All other systems reviewed and are negative.   10 Systems reviewed and are negative for acute change except as noted in the HPI. Allergies  Review of patient's allergies indicates no known allergies.  Home Medications   Current Outpatient Rx  Name Route Sig Dispense Refill  . ACETAMINOPHEN 500 MG PO TABS Oral Take 1,000 mg by mouth every 6 (six) hours as needed. For pain/headache    . ALLOPURINOL 100 MG PO TABS Oral Take 200 mg by mouth daily.     . ASPIRIN EC 81 MG PO TBEC Oral Take 81 mg by mouth daily.    . ATORVASTATIN CALCIUM 10 MG PO TABS Oral Take 10 mg by mouth daily.     Marland Kitchen DIALYVITE 800/ZINC PO Oral Take 1 tablet by mouth daily.     Marland Kitchen CALCIUM CARBONATE ANTACID 500 MG PO CHEW Oral Chew 1 tablet by mouth daily as needed. For upset stomach    . CINACALCET HCL 30 MG PO TABS Oral Take 30 mg by mouth daily.     . COLCHICINE 0.6 MG PO TABS Oral Take 0.6 mg by mouth 2 (two) times daily. Prn for gout    . DOCUSATE SODIUM 100 MG PO CAPS Oral Take 100 mg by mouth 2 (two) times daily.    Marland Kitchen GABAPENTIN 100 MG PO CAPS Oral Take 100 mg by mouth 2 (two) times daily.    Marland Kitchen OMEPRAZOLE 40 MG PO CPDR Oral Take 40 mg by mouth at bedtime as needed. For reflux    . OVER THE COUNTER MEDICATION Topical Apply 1 application topically daily as needed. For dry feet  otc healing balm    . OXYCODONE-ACETAMINOPHEN 5-325 MG PO TABS Oral Take 1 tablet by mouth every 6 (six) hours as needed for pain. 30 tablet 0  . POLYETHYLENE GLYCOL 3350 PO POWD Oral Take 17 g by mouth daily.     Marland Kitchen SEVELAMER HCL 800 MG PO TABS Oral Take 1,600-3,200 mg by mouth 4 (four) times daily. Patient takes 4 tablets with meals and 2 tablets with snacks      BP 153/78  Temp 97.5 F (36.4 C)  Resp 22  Ht 5\' 8"  (1.727 m)  Wt 179  lb (81.194 kg)  BMI 27.22 kg/m2  SpO2 99%  Physical Exam  Nursing note and vitals reviewed. Constitutional: He is oriented to person, place, and time. He appears well-developed and well-nourished. No distress.  HENT:  Head: Normocephalic and atraumatic.  Eyes: EOM are normal. Pupils are equal, round, and reactive to light.  Neck: Neck supple. No tracheal deviation present.  Cardiovascular: Normal rate.   Pulmonary/Chest: Effort normal. No respiratory distress. He has rales.  Abdominal: Soft.  He exhibits no distension.  Musculoskeletal: Normal range of motion. He exhibits no edema.       Right thigh fistula with good bruit Right upper fistula with good bruit    Neurological: He is alert and oriented to person, place, and time. No sensory deficit.  Skin: Skin is warm and dry.  Psychiatric: He has a normal mood and affect. His behavior is normal.    ED Course  Procedures (including critical care time) DIAGNOSTIC STUDIES: Oxygen Saturation is 99% on room air, normal by my interpretation.    COORDINATION OF CARE: Labs Reviewed  CBC - Abnormal; Notable for the following:    RBC 2.96 (*)    Hemoglobin 9.6 (*)    HCT 31.5 (*)    MCV 106.4 (*)    RDW 17.1 (*)    All other components within normal limits  DIFFERENTIAL - Abnormal; Notable for the following:    Eosinophils Relative 9 (*)    All other components within normal limits  BASIC METABOLIC PANEL - Abnormal; Notable for the following:    Sodium 134 (*)    Potassium 5.5 (*)    Chloride 92 (*)    CO2 33 (*)    Glucose, Bld 101 (*)    BUN 39 (*)    Creatinine, Ser 8.01 (*)    GFR calc non Af Amer 5 (*)    GFR calc Af Amer 6 (*)    All other components within normal limits  D-DIMER, QUANTITATIVE - Abnormal; Notable for the following:    D-Dimer, Quant 6.30 (*)    All other components within normal limits  POCT I-STAT, CHEM 8 - Abnormal; Notable for the following:    Potassium 5.5 (*)    BUN 39 (*)    Creatinine, Ser  7.90 (*)    Calcium, Ion 1.04 (*)    Hemoglobin 10.2 (*)    HCT 30.0 (*)    All other components within normal limits  PROTIME-INR  CARDIAC PANEL(CRET KIN+CKTOT+MB+TROPI)   Dg Chest 2 View  03/10/2012  *RADIOLOGY REPORT*  Clinical Data: Shortness of breath.  Wheezing.  CHEST - 2 VIEW  Comparison: 03/01/2012 and 02/29/2012.  Findings: The heart size and mediastinal contours are stable status post CABG.  Right axillary vascular stent is noted.  Compared with prior examinations, there is increased interstitial prominence at both lung bases with mild blunting of the posterior costophrenic angles on the lateral view.  There is no confluent airspace opacity or pneumothorax.  IMPRESSION: Increased interstitial prominence at both lung bases and possible small pleural effusions, suggesting mild edema or atypical infection.  No consolidation.  Original Report Authenticated By: Gerrianne Scale, M.D.     1. Chest pain on exertion       MDM  Dialysis patient with shortness of breath today, worse with exertion as well as chest tightness worse with exertion. History of bypass 12 years ago. Does not think he has had any provocative testing since then. Has not missed any dialysis, due tomorrow.  Patient has mild hyperkalemia without EKG changes. He is chest pain-free at this time. Nonischemic EKG with negative troponin.  Ddimer positive.  CTPE ordered.  Patient with history of coronary disease now with chest pain and shortness of breath with exertion. Due for dialysis tomorrow has not had any provocative testing since bypass 12 years ago. Will admit with concerning for angina.  D/w Dr. Kerry Hough.   Date: 03/10/2012  Rate: 83  Rhythm: normal sinus rhythm and  premature ventricular contractions (PVC)  QRS Axis: normal  Intervals: normal  ST/T Wave abnormalities: nonspecific ST/T changes  Conduction Disutrbances:none  Narrative Interpretation:   Old EKG Reviewed: unchanged   I personally performed the  services described in this documentation, which was scribed in my presence.  The recorded information has been reviewed and considered.        Glynn Octave, MD 03/10/12 [phone]

## 2012-03-10 NOTE — ED Notes (Signed)
Report given and patient to be transferred to room 317.

## 2012-03-11 ENCOUNTER — Inpatient Hospital Stay (HOSPITAL_COMMUNITY): Payer: Medicare Other

## 2012-03-11 DIAGNOSIS — I5031 Acute diastolic (congestive) heart failure: Secondary | ICD-10-CM | POA: Diagnosis present

## 2012-03-11 DIAGNOSIS — I517 Cardiomegaly: Secondary | ICD-10-CM

## 2012-03-11 DIAGNOSIS — R079 Chest pain, unspecified: Secondary | ICD-10-CM

## 2012-03-11 DIAGNOSIS — N186 End stage renal disease: Secondary | ICD-10-CM

## 2012-03-11 LAB — LIPID PANEL
LDL Cholesterol: 41 mg/dL (ref 0–99)
Total CHOL/HDL Ratio: 2.6 RATIO
Triglycerides: 117 mg/dL (ref <150)
VLDL: 23 mg/dL (ref 0–40)

## 2012-03-11 LAB — CBC
HCT: 30.5 % — ABNORMAL LOW (ref 39.0–52.0)
Hemoglobin: 9.3 g/dL — ABNORMAL LOW (ref 13.0–17.0)
MCH: 32.5 pg (ref 26.0–34.0)
MCHC: 30.5 g/dL (ref 30.0–36.0)
RBC: 2.86 MIL/uL — ABNORMAL LOW (ref 4.22–5.81)

## 2012-03-11 LAB — CARDIAC PANEL(CRET KIN+CKTOT+MB+TROPI)
CK, MB: 2 ng/mL (ref 0.3–4.0)
CK, MB: 1.8 ng/mL (ref 0.3–4.0)
Total CK: 43 U/L (ref 7–232)
Troponin I: <0.3 ng/mL (ref <0.30)

## 2012-03-11 LAB — BASIC METABOLIC PANEL
BUN: 45 mg/dL — ABNORMAL HIGH (ref 6–23)
CO2: 35 mEq/L — ABNORMAL HIGH (ref 19–32)
Chloride: 93 mEq/L — ABNORMAL LOW (ref 96–112)
Glucose, Bld: 92 mg/dL (ref 70–99)
Potassium: 5.9 mEq/L — ABNORMAL HIGH (ref 3.5–5.1)
Sodium: 136 mEq/L (ref 135–145)

## 2012-03-11 MED ORDER — EPOETIN ALFA 10000 UNIT/ML IJ SOLN
10000.0000 [IU] | INTRAMUSCULAR | Status: DC
  Administered 2012-03-11 – 2012-03-13 (×2): 10000 [IU] via INTRAVENOUS
  Filled 2012-03-11: qty 1

## 2012-03-11 MED ORDER — NEPHRO-VITE 0.8 MG PO TABS
1.0000 | ORAL_TABLET | Freq: Every day | ORAL | Status: DC
  Administered 2012-03-11 – 2012-03-13 (×3): 1 via ORAL
  Filled 2012-03-11 (×7): qty 1

## 2012-03-11 MED ORDER — SEVELAMER CARBONATE 800 MG PO TABS
3200.0000 mg | ORAL_TABLET | Freq: Three times a day (TID) | ORAL | Status: DC
  Administered 2012-03-11 – 2012-03-13 (×6): 3200 mg via ORAL
  Filled 2012-03-11 (×4): qty 4

## 2012-03-11 MED ORDER — HEPARIN SODIUM (PORCINE) 1000 UNIT/ML DIALYSIS
20.0000 [IU]/kg | INTRAMUSCULAR | Status: DC | PRN
  Administered 2012-03-11 – 2012-03-13 (×2): 1700 [IU] via INTRAVENOUS_CENTRAL
  Filled 2012-03-11: qty 2

## 2012-03-11 MED ORDER — HEPARIN SODIUM (PORCINE) 1000 UNIT/ML DIALYSIS
500.0000 [IU] | INTRAMUSCULAR | Status: DC | PRN
  Administered 2012-03-11 – 2012-03-13 (×2): 500 [IU] via INTRAVENOUS_CENTRAL
  Filled 2012-03-11: qty 1

## 2012-03-11 MED ORDER — ALBUTEROL SULFATE HFA 108 (90 BASE) MCG/ACT IN AERS: 2.0000 | INHALATION_SPRAY | RESPIRATORY_TRACT | Status: DC | PRN

## 2012-03-11 MED ORDER — POLYETHYLENE GLYCOL 3350 17 G PO PACK
17.0000 g | PACK | Freq: Every day | ORAL | Status: DC
  Administered 2012-03-11 – 2012-03-12 (×2): 17 g via ORAL
  Filled 2012-03-11 (×2): qty 1

## 2012-03-11 MED ORDER — SEVELAMER CARBONATE 800 MG PO TABS
1600.0000 mg | ORAL_TABLET | ORAL | Status: DC | PRN
  Filled 2012-03-11 (×2): qty 2

## 2012-03-11 NOTE — Progress Notes (Signed)
UR Chart Review Completed  

## 2012-03-11 NOTE — Progress Notes (Signed)
*  PRELIMINARY RESULTS* Echocardiogram 2D Echocardiogram has been performed.  Conrad [place] 03/11/2012, 3:57 PM

## 2012-03-11 NOTE — Progress Notes (Signed)
Subjective: Feels better after hemodialysis, no shortness of breath or chest pain  Objective: Vital signs in last 24 hours: Temp:  [97.8 F (36.6 C)-98.4 F (36.9 C)] 98 F (36.7 C) (04/15 1415) Pulse Rate:  [83-103] 98  (04/15 1415) Resp:  [18-20] 20  (04/15 1415) BP: (98-152)/(47-95) 132/95 mmHg (04/15 1415) SpO2:  [95 %-99 %] 98 % (04/15 1415) FiO2 (%):  [2 %] 2 % (04/15 1415) Weight:  [80.3 kg (177 lb 0.5 oz)-83.2 kg (183 lb 6.8 oz)] 80.3 kg (177 lb 0.5 oz) (04/15 1415) Weight change:  Last BM Date: 03/10/12  Intake/Output from previous day: 04/14 0701 - 04/15 0700 In: 240 [P.O.:240] Out: -  Total I/O In: -  Out: 3000 [Other:3000]   Physical Exam: General: Alert, awake, oriented x3, in no acute distress. HEENT: No bruits, no goiter. Heart: Regular rate and rhythm, without murmurs, rubs, gallops. Lungs: Clear to auscultation bilaterally. Abdomen: Soft, nontender, nondistended, positive bowel sounds. Extremities: No clubbing cyanosis or edema with positive pedal pulses. Neuro: Grossly intact, nonfocal.    Lab Results: Basic Metabolic Panel:  Basename 03/11/12 0530 03/10/12 1439 03/10/12 1436  NA 136 137 --  K 5.9* 5.5* --  CL 93* 98 --  CO2 35* -- 33*  GLUCOSE 92 99 --  BUN 45* 39* --  CREATININE 9.03* 7.90* --  CALCIUM 8.7 -- 9.1  MG -- -- --  PHOS -- -- --   Liver Function Tests: No results found for this basename: AST:2,ALT:2,ALKPHOS:2,BILITOT:2,PROT:2,ALBUMIN:2 in the last 72 hours No results found for this basename: LIPASE:2,AMYLASE:2 in the last 72 hours No results found for this basename: AMMONIA:2 in the last 72 hours CBC:  Basename 03/11/12 0530 03/10/12 1439 03/10/12 1436  WBC 4.5 -- 4.0  NEUTROABS -- -- 2.2  HGB 9.3* 10.2* --  HCT 30.5* 30.0* --  MCV 106.6* -- 106.4*  PLT 150 -- 152   Cardiac Enzymes:  Basename 03/11/12 0915 03/11/12 0111 03/10/12 1812  CKTOTAL 35 43 57  CKMB 1.8 2.0 2.5  CKMBINDEX -- -- --  TROPONINI <0.30 <0.30  <0.30   BNP: No results found for this basename: PROBNP:3 in the last 72 hours D-Dimer:  [facility] 03/10/12 1436  DDIMER 6.30*   CBG: No results found for this basename: GLUCAP:6 in the last 72 hours Hemoglobin A1C: No results found for this basename: HGBA1C in the last 72 hours Fasting Lipid Panel:  Basename 03/11/12 0530  CHOL 103  HDL 39*  LDLCALC 41  TRIG 829  CHOLHDL 2.6  LDLDIRECT --   Thyroid Function Tests:  Basename 03/10/12 1812  TSH 1.845  T4TOTAL --  FREET4 --  T3FREE --  THYROIDAB --   Anemia Panel: No results found for this basename: VITAMINB12,FOLATE,FERRITIN,TIBC,IRON,RETICCTPCT in the last 72 hours Coagulation:  Basename 03/10/12 1436  LABPROT 15.1  INR 1.17   Urine Drug Screen: Drugs of Abuse  No results found for this basename: labopia, cocainscrnur, labbenz, amphetmu, thcu, labbarb    Alcohol Level: No results found for this basename: ETH:2 in the last 72 hours Urinalysis: No results found for this basename: COLORURINE:2,APPERANCEUR:2,LABSPEC:2,PHURINE:2,GLUCOSEU:2,HGBUR:2,BILIRUBINUR:2,KETONESUR:2,PROTEINUR:2,UROBILINOGEN:2,NITRITE:2,LEUKOCYTESUR:2 in the last 72 hours  No results found for this or any previous visit (from the past 240 hour(s)).  Studies/Results: Dg Chest 2 View  03/10/2012  *RADIOLOGY REPORT*  Clinical Data: Shortness of breath.  Wheezing.  CHEST - 2 VIEW  Comparison: 03/01/2012 and 02/29/2012.  Findings: The heart size and mediastinal contours are stable status post CABG.  Right axillary vascular stent is  noted.  Compared with prior examinations, there is increased interstitial prominence at both lung bases with mild blunting of the posterior costophrenic angles on the lateral view.  There is no confluent airspace opacity or pneumothorax.  IMPRESSION: Increased interstitial prominence at both lung bases and possible small pleural effusions, suggesting mild edema or atypical infection.  No consolidation.  Original Report  Authenticated By: Gerrianne Scale, M.D.   Ct Angio Chest W/cm &/or Wo Cm  03/10/2012  *RADIOLOGY REPORT*  Clinical Data: Chest pain with exertion.  Chronic hemodialysis. History of bladder and renal cell carcinoma.  CT ANGIOGRAPHY CHEST  Technique:  Multidetector CT imaging of the chest using the standard protocol during bolus administration of intravenous contrast. Multiplanar reconstructed images including MIPs were obtained and reviewed to evaluate the vascular anatomy.  Contrast: OMNIPAQUE IOHEXOL 350 MG/ML SOLN  Comparison: None.  Findings: Pulmonary arterial opacification is satisfactory, although not optimal.  There is no evidence of acute pulmonary embolism.  There is diffuse atherosclerosis of the great vessels, aorta and coronary artery status post CABG.  There is a right subclavian venous stent.  There are scattered prominent mediastinal and hilar lymph nodes. There is a 1 cm AP window node on image 31 and a 1.5 cm subcarinal node on image 39.  There appears to be mild diffuse esophageal wall thickening.  There are small dependent pleural effusions bilaterally.  No pericardial effusion is seen. There is a 1 cm right thyroid nodule on image 11.  The lungs demonstrate mild central airway thickening and dependent atelectasis.  There is no confluent airspace opacity or endobronchial lesion.  The visualized upper abdomen appears unremarkable.  IMPRESSION:  1.  No evidence of acute pulmonary embolism. 2.  Small bilateral pleural effusions and mild central airway thickening.  No confluent airspace opacity. 3.  Nonspecific mildly prominent mediastinal and hilar lymph nodes. 4.  Suggested mild diffuse esophageal wall thickening.  Original Report Authenticated By: Gerrianne Scale, M.D.    Medications: Scheduled Meds:   . allopurinol  200 mg Oral Daily  . aspirin  324 mg Oral Once  . aspirin EC  325 mg Oral Daily  . atorvastatin  10 mg Oral Daily  . cinacalcet  30 mg Oral Daily  . docusate  sodium  100 mg Oral BID  . enoxaparin  30 mg Subcutaneous Q24H  . epoetin alfa  10,000 Units Intravenous 3 times weekly  . gabapentin  100 mg Oral BID  . multivitamin  1 tablet Oral Daily  . pantoprazole  40 mg Oral Q1200  . polyethylene glycol  17 g Oral Daily  . sevelamer  3,200 mg Oral TID WC  . sodium chloride  3 mL Intravenous Q12H  . sodium polystyrene  30 g Oral Once  . DISCONTD: albuterol  2 puff Inhalation QID  . DISCONTD: DIALYVITE 800 WITH ZINC  1 tablet Oral Daily  . DISCONTD: polyethylene glycol powder  17 g Oral Daily  . DISCONTD: sevelamer  1,600-3,200 mg Oral QID   Continuous Infusions:  PRN Meds:.acetaminophen, acetaminophen, albuterol, heparin, heparin, morphine, nitroGLYCERIN, ondansetron (ZOFRAN) IV, ondansetron, sevelamer, DISCONTD: ondansetron (ZOFRAN) IV  Assessment/Plan:  Principal Problem:  *Chest pain on exertion Active Problems:  SECONDARY HYPERPARATHYROIDISM  ANEMIA  Hypertension  Hyperlipidemia  ESRD (end stage renal disease)  Arteriosclerotic cardiovascular disease (ASCVD)  Hyperkalemia  1. Chest pain with h/o CAD s/p CABG.  Appreciate cardiology input. Plan for echo today and stress test in am.  2. ESRD on HD.  Patient had dialysis today, overall feels better now.  3. Hyperkalemia, likely improved with HD  4. HTN, stable  Dispo. Plan will be to discharge home once cardiac work up is complete   LOS: 1 day   Nayla Dias [facility] Pager: [phone] 03/11/2012, 4:04 PM

## 2012-03-11 NOTE — Consult Note (Signed)
CARDIOLOGY CONSULT NOTE  Patient ID: Andrew [name] MRN: [id number] DOB/AGE: 1926-05-31 78 y.o.  Admit date: 03/10/2012 Referring Physician: PTH Primary PhysicianFUSCO,Andrew J., MD, MD Primary Cardiologist: Andrew [name] Reason for Consultation: Chest Pain Principal Problem:  *Chest pain on exertion Active Problems:  SECONDARY HYPERPARATHYROIDISM  ANEMIA  Hypertension  Hyperlipidemia  ESRD (end stage renal disease)  Arteriosclerotic cardiovascular disease (ASCVD)  Hyperkalemia  HPI: Andrew [name] is an 78 year old Caucasian male patient of Andrew [name], that we follow for history of CAD with 3 vessel coronary artery bypass grafting 1998, cardiac catheterization in 2004 with severe native vessel disease and patent grafts and normal LV function,, end-stage renal disease on dialysis, PCI and angioplasty of the left renal artery secondary to renal artery stenosis, hypertension, and hypercholesterolemia, minor CVA, in 2006, status post nephrectomy for renal cell carcinoma,. The patient had recent fistula access placement in the right thigh to replace a right upper arm fistula that is still being utilized for dialysis, but apparently is suboptimal. Surgery was performed by Andrew [name], approximately 10 days ago.  He has some mild confusion postoperatively that cleared after a few days. He has had recent complaints of fatigue and shortness of breath. Yesterday after getting up out of bed. He felt extreme fatigue and shortness of breath.   He also complained of chest pressure. His most recent dialysis was 03/09/2011. Marland Kitchen He associates these symptoms with the need for dialysis, but also reports they are similar to those that he experienced prior to bypass surgery in 1998. No recent stress testing or echocardiogram. He states he feels better when he lays down, but getting up to do any kind of exertional activity causes him to feel pressure and shortness of breath.  He is noted to have normal cardiac  enzymes X 3. Potassium 5.9, creatinine 9.03.  He has mild anemia with a hemoglobin of 9.3. CT scan on admission, revealing no evidence of PE. He did have some bilateral pleural effusions, small with mild central airway thickening, there was nonspecific mildly prominent mediastinal, hilar lymph nodes and mild diffuse esophageal wall thickening. He is currently feeling somewhat better, is due to have dialysis today. He remains fatigued and has been staying in bed.since admission secondary to this.  Review of systems complete and found to be negative unless listed above   Past Medical History  Diagnosis Date  . Secondary hyperparathyroidism   . Peripheral vascular disease   . Hyperlipidemia   . Duodenal ulcer     remote  . Gout STABLE  . ESRD (end stage renal disease) NEPHROLOGIST-   Andrew [name]    Hemodialysis; Hx of bleeding from aneurysms on AVF  . Arteriosclerotic cardiovascular disease (ASCVD) 1998    CABG-1998; h/o CHF  . Cerebrovascular disease REMOTE --  PER CHART--    NO RESIDUAL  . Neuropathy of foot COLD FEET  . Degenerative joint disease   . Gastroesophageal reflux disease   . History of bladder cancer   . Impaired hearing BILATERAL     ONLY WEARS LEFT HEARING AID  . Blood transfusion   . Anemia   . Renal cell carcinoma 2001    s/p right nephrectomy-2001; subsequent ESRD  . Hypertension     Family History  Problem Relation Age of Onset  . Colon cancer Neg Hx   . Liver disease Neg Hx   . Heart disease Father     History   Social History  . Marital Status: Widowed    Spouse Name:  N/A    Number of Children: 3  . Years of Education: N/A   Occupational History  . Retired    Social History Main Topics  . Smoking status: Former Smoker -- 1.0 packs/day for 25 years    Types: Cigarettes    Quit date: 02/26/1984  . Smokeless tobacco: Former Neurosurgeon   Comment: quit 1985  . Alcohol Use: No  . Drug Use: No  . Sexually Active: Not Currently   Other Topics Concern  . Not  on file   Social History Narrative  . No narrative on file    Past Surgical History  Procedure Date  . Right nephrectomy 2001    Renal cell carcinoma  . Colonoscopy 01/24/2011    prominent vascular pattern, suboptimal prep but doable  . Esophagogastroduodenoscopy 01/24/2011    mild erosive reflux esophagitis, bulbar/antral erosions, bx from antrum benign  . Av fistula repair 2008    revision of anastomosis of Right AVF  . Arteriovenous graft placement 2006    Thrombectomy and interposition jump graft revision to higher axillary vein of LUA AVG  . Coronary artery bypass graft 1998    X4 VESSELS  . Multiple cysto/ resection tumor's with bx's LAST ONE 05-09-2011  . Multiple surg's / interventions for avgg/ fistula right upper arm LAST REVISION 06-29-2011    (JUNE 2010 STENT CEPHALIC VEIN/ 03-23-2011 DILATATION ANGIOPLASTY)  . Left ptc left renal artery   . Cardiac catheterization 2004  . Cataract extraction w/ intraocular lens  implant, bilateral   . Cystoscopy w/ retrogrades 12/28/2011    Procedure: CYSTOSCOPY WITH RETROGRADE PYELOGRAM;  Surgeon: Andrew [name], MD;  Location: [facility];  Service: Urology;  Laterality: Left;  C-ARM   . Transurethral resection of bladder tumor 12/28/2011    Procedure: TRANSURETHRAL RESECTION OF BLADDER TUMOR (TURBT);  Surgeon: Andrew [name], MD;  Location: [facility];  Service: Urology;  Laterality: N/A;  . Av fistula placement 02/29/2012    Procedure: INSERTION OF ARTERIOVENOUS (AV) GORE-TEX GRAFT THIGH;  Surgeon: Andrew Earthly, MD;  Location: [facility] OR;  Service: Vascular;  Laterality: Right;  Insertion right femoral arteriovenous gortex graft     Prescriptions prior to admission  Medication Sig Dispense Refill  . acetaminophen (TYLENOL) 500 MG tablet Take 1,000 mg by mouth every 6 (six) hours as needed. For pain/headache      . allopurinol (ZYLOPRIM) 100 MG tablet Take 200 mg by mouth daily.       Marland Kitchen aspirin EC 81 MG tablet  Take 81 mg by mouth daily.      Marland Kitchen atorvastatin (LIPITOR) 10 MG tablet Take 10 mg by mouth daily.       . B Complex-C-Zn-Folic Acid (DIALYVITE 800/ZINC PO) Take 1 tablet by mouth daily.       . calcium carbonate (TUMS - DOSED IN MG ELEMENTAL CALCIUM) 500 MG chewable tablet Chew 1 tablet by mouth daily as needed. For upset stomach      . cinacalcet (SENSIPAR) 30 MG tablet Take 30 mg by mouth daily.       . colchicine (COLCRYS) 0.6 MG tablet Take 0.6 mg by mouth 2 (two) times daily. Prn for gout      . docusate sodium (COLACE) 100 MG capsule Take 100 mg by mouth 2 (two) times daily.      Marland Kitchen gabapentin (NEURONTIN) 100 MG capsule Take 100 mg by mouth 2 (two) times daily.      Marland Kitchen omeprazole (PRILOSEC) 40 MG  capsule Take 40 mg by mouth at bedtime as needed. For reflux      . OVER THE COUNTER MEDICATION Apply 1 application topically daily as needed. For dry feet  otc healing balm      . polyethylene glycol powder (MIRALAX) powder Take 17 g by mouth daily.       . sevelamer (RENAGEL) 800 MG tablet Take 1,600-3,200 mg by mouth 4 (four) times daily. Patient takes 4 tablets with meals and 2 tablets with snacks        Physical Exam: Blood pressure 152/64, pulse 90, temperature 97.9 F (36.6 C), temperature source Oral, resp. rate 20, height 5\' 8"  (1.727 m), weight 183 lb 6.8 oz (83.2 kg), SpO2 96.00%.  General: Well developed, well nourished, in no acute distress Head: Eyes PERRLA, No xanthomas.   Normal cephalic and atramatic  Lungs: Clear bilaterally diminished bibasilar without wheezes Heart: HRRR S1 S2, 2/6 systolic ejection murmur murmur with radiation to the carotids bilaterally Pulses are 2+ & equal.     Mild JVD. Soft abdominal bruits. No femoral bruits. Abdomen: Bowel sounds are positive, abdomen soft with complaints of tenderness in the lower right quadrant with palpation, without masses or                  Hernia's noted. Msk:  Back normal, normal gait. Normal strength and tone for  age. Extremities: No clubbing, cyanosis old AV fistula in the right upper arm, with significant edema and bruising,  Palpable thrill.  Right femoral AV fistula with to-and-fro bruit.  DP +1 Neuro: Alert and oriented X 3. Psych:  Good affect, responds appropriately   Lab Results  Component Value Date   WBC 4.5 03/11/2012   HGB 9.3* 03/11/2012   HCT 30.5* 03/11/2012   MCV 106.6* 03/11/2012   PLT 150 03/11/2012    Lab 03/11/12 0530  NA 136  K 5.9*  CL 93*  CO2 35*  BUN 45*  CREATININE 9.03*  CALCIUM 8.7  PROT --  BILITOT --  ALKPHOS --  ALT --  AST --  GLUCOSE 92   Lab Results  Component Value Date   CKTOTAL 43 03/11/2012   CKMB 2.0 03/11/2012   TROPONINI <0.30 03/11/2012    Lab Results  Component Value Date   CHOL 103 03/11/2012   CHOL  Value: 104        ATP III CLASSIFICATION:  <200     mg/dL   Desirable  578-469  mg/dL   Borderline High  >=629    mg/dL   High        03/28/8412   Lab Results  Component Value Date   HDL 39* 03/11/2012   HDL 31* 05/04/2009   Lab Results  Component Value Date   LDLCALC 41 03/11/2012   LDLCALC  Value: 45        Total Cholesterol/HDL:CHD Risk Coronary Heart Disease Risk Table                     Men   Women  1/2 Average Risk   3.4   3.3  Average Risk       5.0   4.4  2 X Average Risk   9.6   7.1  3 X Average Risk  23.4   11.0        Use the calculated Patient Ratio above and the CHD Risk Table to determine the patient's CHD Risk.        ATP III  CLASSIFICATION (LDL):  <100     mg/dL   Optimal  213-086  mg/dL   Near or Above                    Optimal  130-159  mg/dL   Borderline  578-469  mg/dL   High  >629     mg/dL   Very High 03/28/8412   Lab Results  Component Value Date   TRIG 117 03/11/2012   TRIG 140 05/04/2009   Lab Results  Component Value Date   CHOLHDL 2.6 03/11/2012   CHOLHDL 3.4 05/04/2009   No results found for this basename: LDLDIRECT   Radiology: Dg Chest 2 View  03/10/2012  *RADIOLOGY REPORT*  Clinical Data: Shortness of breath.   Wheezing.  CHEST - 2 VIEW  Comparison: 03/01/2012 and 02/29/2012.  IMPRESSION: Increased interstitial prominence at both lung bases and possible small pleural effusions, suggesting mild edema or atypical infection.  No consolidation.  Original Report Authenticated By: Gerrianne Scale, M.D.   Ct Angio Chest W/cm &/or Wo Cm  03/10/2012  *RADIOLOGY REPORT*  Clinical Data: Chest pain with exertion.  Chronic hemodialysis. History of bladder and renal cell carcinoma.  CT ANGIOGRAPHY CHEST  Technique IMPRESSION:  1.  No evidence of acute pulmonary embolism. 2.  Small bilateral pleural effusions and mild central airway thickening.  No confluent airspace opacity. 3.  Nonspecific mildly prominent mediastinal and hilar lymph nodes. 4.  Suggested mild diffuse esophageal wall thickening.  Original Report Authenticated By: Gerrianne Scale, M.D.   EKG:NSR with PVC, nonspecific T-wave abnormalties inferior laterally. No evidence of acute ischemia.  ASSESSMENT AND PLAN:   1. Chest Pain: Patient has class III [employer] Heart Association dyspnea.  Cardiac enzymes are reassuring. . We will plan echocardiogram for LV function, once he completes dialysis, today. We will plan for nuclear medicine pharmacologic stress Myoview in a.m. . Hemodynamically he is stable at present, with no plans to change medications at this time.   2. CAD: Three vessel CABG in 1998. Repeat cath in 2006 as above. EKG does not show acute ischemia. Continue ASA. Stress test and echo planned..  3. ESRD:  Managed by Andrew. Fausto Skillern with dialysis planned today.  As an outpatient, Mr. Isabell is followed by [facility].  Bettey Mare. Lyman Bishop NP [facility] 03/11/2012, 9:14 AM  Cardiology Attending Patient interviewed and examined. Discussed with Joni Reining, NP.  Above note annotated and modified based upon my findings.  Patient presents with congestive heart failure, likely the result of inadequate dialysis and fluid removal. If his  weight is not above his baseline, but he may have gradually lost lean body mass, particularly in the perioperative interval around his recent surgery. Is markedly improved after removal of 3 L of fluid at dialysis today. Dry weight is probably somewhat below where he is at present. The chest discomfort associated with dyspnea could simply reflect the presence of pulmonary edema; however, 15 years after bypass surgery, recurrence of myocardial ischemia certainly consideration. Pharmacologic stress nuclear myocardial study should help clarify these issues and will be performed tomorrow.  [place] Bing, MD 03/11/2012, 6:37 PM

## 2012-03-11 NOTE — Consult Note (Signed)
Reason for Consult:end-stage renal disease and hyperkalemia. Referring Physician: Dr Caron Presume is an 78 y.o. male.  HPI: her he's the patient was history of coronary disease status post CABG and also a history of end-stage renal disease on maintenance hemodialysis Monday , Wednesday and Friday presently had came to the hospital with complaints of exertional dyspnea. According to the patient was discharged to [facility] on Sunday. The morning he started having difficulty in breathing and also substernal chest pain hence decided to come to the hospital. Presently is feeling better. He denies any cough fever chills or sweating.  Past Medical History  Diagnosis Date  . Secondary hyperparathyroidism   . Peripheral vascular disease   . Hyperlipidemia   . Duodenal ulcer     remote  . Gout STABLE  . ESRD (end stage renal disease) NEPHROLOGIST-   DR [name]    Hemodialysis; Hx of bleeding from aneurysms on AVF  . Arteriosclerotic cardiovascular disease (ASCVD) 1998    CABG-1998; h/o CHF  . Cerebrovascular disease REMOTE --  PER CHART--    NO RESIDUAL  . Neuropathy of foot COLD FEET  . Degenerative joint disease   . Gastroesophageal reflux disease   . History of bladder cancer   . Impaired hearing BILATERAL     ONLY WEARS LEFT HEARING AID  . Blood transfusion   . Anemia   . Renal cell carcinoma 2001    s/p right nephrectomy-2001; subsequent ESRD  . Hypertension     Past Surgical History  Procedure Date  . Right nephrectomy 2001    Renal cell carcinoma  . Colonoscopy 01/24/2011    prominent vascular pattern, suboptimal prep but doable  . Esophagogastroduodenoscopy 01/24/2011    mild erosive reflux esophagitis, bulbar/antral erosions, bx from antrum benign  . Av fistula repair 2008    revision of anastomosis of Right AVF  . Arteriovenous graft placement 2006    Thrombectomy and interposition jump graft revision to higher axillary vein of LUA AVG  . Coronary artery  bypass graft 1998    X4 VESSELS  . Multiple cysto/ resection tumor's with bx's LAST ONE 05-09-2011  . Multiple surg's / interventions for avgg/ fistula right upper arm LAST REVISION 06-29-2011    (JUNE 2010 STENT CEPHALIC VEIN/ 03-23-2011 DILATATION ANGIOPLASTY)  . Left ptc left renal artery   . Cardiac catheterization 2004  . Cataract extraction w/ intraocular lens  implant, bilateral   . Cystoscopy w/ retrogrades 12/28/2011    Procedure: CYSTOSCOPY WITH RETROGRADE PYELOGRAM;  Surgeon: [name], MD;  Location: [facility];  Service: Urology;  Laterality: Left;  C-ARM   . Transurethral resection of bladder tumor 12/28/2011    Procedure: TRANSURETHRAL RESECTION OF BLADDER TUMOR (TURBT);  Surgeon: [name], MD;  Location: [facility];  Service: Urology;  Laterality: N/A;  . Av fistula placement 02/29/2012    Procedure: INSERTION OF ARTERIOVENOUS (AV) GORE-TEX GRAFT THIGH;  Surgeon: [name], MD;  Location: [facility] OR;  Service: Vascular;  Laterality: Right;  Insertion right femoral arteriovenous gortex graft    Family History  Problem Relation Age of Onset  . Colon cancer Neg Hx   . Liver disease Neg Hx   . Heart disease Father     Social History:  reports that he quit smoking about 28 years ago. His smoking use included Cigarettes. He has a 25 pack-year smoking history. He has quit using smokeless tobacco. He reports that he does not  drink alcohol or use illicit drugs.  Allergies: No Known Allergies  Medications: I have reviewed the patient's current medications.  Results for orders placed during the hospital encounter of 03/10/12 (from the past 48 hour(s))  CBC     Status: Abnormal   Collection Time   03/10/12  2:36 PM      Component Value Range Comment   WBC 4.0  4.0 - 10.5 (K/uL)    RBC 2.96 (*) 4.22 - 5.81 (MIL/uL)    Hemoglobin 9.6 (*) 13.0 - 17.0 (g/dL)    HCT 16.1 (*) 09.6 - 52.0 (%)    MCV 106.4 (*) 78.0 - 100.0 (fL)    MCH 32.4  26.0 -  34.0 (pg)    MCHC 30.5  30.0 - 36.0 (g/dL)    RDW 04.5 (*) 40.9 - 15.5 (%)    Platelets 152  150 - 400 (K/uL)   DIFFERENTIAL     Status: Abnormal   Collection Time   03/10/12  2:36 PM      Component Value Range Comment   Neutrophils Relative 56  43 - 77 (%)    Neutro Abs 2.2  1.7 - 7.7 (K/uL)    Lymphocytes Relative 25  12 - 46 (%)    Lymphs Abs 1.0  0.7 - 4.0 (K/uL)    Monocytes Relative 9  3 - 12 (%)    Monocytes Absolute 0.4  0.1 - 1.0 (K/uL)    Eosinophils Relative 9 (*) 0 - 5 (%)    Eosinophils Absolute 0.4  0.0 - 0.7 (K/uL)    Basophils Relative 1  0 - 1 (%)    Basophils Absolute 0.0  0.0 - 0.1 (K/uL)   BASIC METABOLIC PANEL     Status: Abnormal   Collection Time   03/10/12  2:36 PM      Component Value Range Comment   Sodium 134 (*) 135 - 145 (mEq/L)    Potassium 5.5 (*) 3.5 - 5.1 (mEq/L)    Chloride 92 (*) 96 - 112 (mEq/L)    CO2 33 (*) 19 - 32 (mEq/L)    Glucose, Bld 101 (*) 70 - 99 (mg/dL)    BUN 39 (*) 6 - 23 (mg/dL)    Creatinine, Ser 8.11 (*) 0.50 - 1.35 (mg/dL)    Calcium 9.1  8.4 - 10.5 (mg/dL)    GFR calc non Af Amer 5 (*) >90 (mL/min)    GFR calc Af Amer 6 (*) >90 (mL/min)   PROTIME-INR     Status: Normal   Collection Time   03/10/12  2:36 PM      Component Value Range Comment   Prothrombin Time 15.1  11.6 - 15.2 (seconds)    INR 1.17  0.00 - 1.49    CARDIAC PANEL(CRET KIN+CKTOT+MB+TROPI)     Status: Normal   Collection Time   03/10/12  2:36 PM      Component Value Range Comment   Total CK 65  7 - 232 (U/L)    CK, MB 2.5  0.3 - 4.0 (ng/mL)    Troponin I <0.30  <0.30 (ng/mL)    Relative Index RELATIVE INDEX IS INVALID  0.0 - 2.5    D-DIMER, QUANTITATIVE     Status: Abnormal   Collection Time   03/10/12  2:36 PM      Component Value Range Comment   D-Dimer, Quant 6.30 (*) 0.00 - 0.48 (ug/mL-FEU)   POCT I-STAT, CHEM 8     Status: Abnormal   Collection  Time   03/10/12  2:39 PM      Component Value Range Comment   Sodium 137  135 - 145 (mEq/L)     Potassium 5.5 (*) 3.5 - 5.1 (mEq/L)    Chloride 98  96 - 112 (mEq/L)    BUN 39 (*) 6 - 23 (mg/dL)    Creatinine, Ser 1.61 (*) 0.50 - 1.35 (mg/dL)    Glucose, Bld 99  70 - 99 (mg/dL)    Calcium, Ion 0.96 (*) 1.12 - 1.32 (mmol/L)    TCO2 31  0 - 100 (mmol/L)    Hemoglobin 10.2 (*) 13.0 - 17.0 (g/dL)    HCT 04.5 (*) 40.9 - 52.0 (%)   CARDIAC PANEL(CRET KIN+CKTOT+MB+TROPI)     Status: Normal   Collection Time   03/10/12  6:12 PM      Component Value Range Comment   Total CK 57  7 - 232 (U/L)    CK, MB 2.5  0.3 - 4.0 (ng/mL)    Troponin I <0.30  <0.30 (ng/mL)    Relative Index RELATIVE INDEX IS INVALID  0.0 - 2.5    CARDIAC PANEL(CRET KIN+CKTOT+MB+TROPI)     Status: Normal   Collection Time   03/11/12  1:11 AM      Component Value Range Comment   Total CK 43  7 - 232 (U/L)    CK, MB 2.0  0.3 - 4.0 (ng/mL)    Troponin I <0.30  <0.30 (ng/mL)    Relative Index RELATIVE INDEX IS INVALID  0.0 - 2.5    BASIC METABOLIC PANEL     Status: Abnormal   Collection Time   03/11/12  5:30 AM      Component Value Range Comment   Sodium 136  135 - 145 (mEq/L)    Potassium 5.9 (*) 3.5 - 5.1 (mEq/L)    Chloride 93 (*) 96 - 112 (mEq/L)    CO2 35 (*) 19 - 32 (mEq/L)    Glucose, Bld 92  70 - 99 (mg/dL)    BUN 45 (*) 6 - 23 (mg/dL)    Creatinine, Ser 8.11 (*) 0.50 - 1.35 (mg/dL)    Calcium 8.7  8.4 - 10.5 (mg/dL)    GFR calc non Af Amer 5 (*) >90 (mL/min)    GFR calc Af Amer 5 (*) >90 (mL/min)   CBC     Status: Abnormal   Collection Time   03/11/12  5:30 AM      Component Value Range Comment   WBC 4.5  4.0 - 10.5 (K/uL)    RBC 2.86 (*) 4.22 - 5.81 (MIL/uL)    Hemoglobin 9.3 (*) 13.0 - 17.0 (g/dL)    HCT 91.4 (*) 78.2 - 52.0 (%)    MCV 106.6 (*) 78.0 - 100.0 (fL)    MCH 32.5  26.0 - 34.0 (pg)    MCHC 30.5  30.0 - 36.0 (g/dL)    RDW 95.6 (*) 21.3 - 15.5 (%)    Platelets 150  150 - 400 (K/uL)   LIPID PANEL     Status: Abnormal   Collection Time   03/11/12  5:30 AM      Component Value Range Comment    Cholesterol 103  0 - 200 (mg/dL)    Triglycerides 086  <150 (mg/dL)    HDL 39 (*) >57 (mg/dL)    Total CHOL/HDL Ratio 2.6      VLDL 23  0 - 40 (mg/dL)    LDL Cholesterol 41  0 -  99 (mg/dL)     Dg Chest 2 View  1/61/0960  *RADIOLOGY REPORT*  Clinical Data: Shortness of breath.  Wheezing.  CHEST - 2 VIEW  Comparison: 03/01/2012 and 02/29/2012.  Findings: The heart size and mediastinal contours are stable status post CABG.  Right axillary vascular stent is noted.  Compared with prior examinations, there is increased interstitial prominence at both lung bases with mild blunting of the posterior costophrenic angles on the lateral view.  There is no confluent airspace opacity or pneumothorax.  IMPRESSION: Increased interstitial prominence at both lung bases and possible small pleural effusions, suggesting mild edema or atypical infection.  No consolidation.  Original Report Authenticated By: Gerrianne Scale, M.D.   Ct Angio Chest W/cm &/or Wo Cm  03/10/2012  *RADIOLOGY REPORT*  Clinical Data: Chest pain with exertion.  Chronic hemodialysis. History of bladder and renal cell carcinoma.  CT ANGIOGRAPHY CHEST  Technique:  Multidetector CT imaging of the chest using the standard protocol during bolus administration of intravenous contrast. Multiplanar reconstructed images including MIPs were obtained and reviewed to evaluate the vascular anatomy.  Contrast: OMNIPAQUE IOHEXOL 350 MG/ML SOLN  Comparison: None.  Findings: Pulmonary arterial opacification is satisfactory, although not optimal.  There is no evidence of acute pulmonary embolism.  There is diffuse atherosclerosis of the great vessels, aorta and coronary artery status post CABG.  There is a right subclavian venous stent.  There are scattered prominent mediastinal and hilar lymph nodes. There is a 1 cm AP window node on image 31 and a 1.5 cm subcarinal node on image 39.  There appears to be mild diffuse esophageal wall thickening.  There are  small dependent pleural effusions bilaterally.  No pericardial effusion is seen. There is a 1 cm right thyroid nodule on image 11.  The lungs demonstrate mild central airway thickening and dependent atelectasis.  There is no confluent airspace opacity or endobronchial lesion.  The visualized upper abdomen appears unremarkable.  IMPRESSION:  1.  No evidence of acute pulmonary embolism. 2.  Small bilateral pleural effusions and mild central airway thickening.  No confluent airspace opacity. 3.  Nonspecific mildly prominent mediastinal and hilar lymph nodes. 4.  Suggested mild diffuse esophageal wall thickening.  Original Report Authenticated By: Gerrianne Scale, M.D.    Review of Systems  Respiratory: Positive for shortness of breath.        Exertional dyspnea. Patient states that he feels short-winded even going to the bathroom.  Cardiovascular: Positive for chest pain.       Chest pain in substernal. It is exacerbated by breathing and by movement. He described it as dull. The pain is none radiating.  Gastrointestinal: Positive for nausea. Negative for vomiting.  Neurological: Positive for dizziness and weakness.   Blood pressure 152/64, pulse 90, temperature 97.9 F (36.6 C), temperature source Oral, resp. rate 20, height 5\' 8"  (1.727 m), weight 83.2 kg (183 lb 6.8 oz), SpO2 97.00%. Physical Exam  Eyes: No scleral icterus.  Neck: No JVD present.  Cardiovascular: Normal rate and normal heart sounds.  Exam reveals no friction rub.   Respiratory:       Decreased breath sound bilaterally  GI: There is no tenderness. There is no rebound.  Musculoskeletal: He exhibits no edema.    Assessment/Plan: Problem #1 end-stage renal disease is status post hemodialysis on Friday his BUN and creatinine was in acceptable range. He has some nausea but no vomiting. Problem #2 hyperkalemia Problem #3 history of chest pain and exertional  dyspnea possibly cardiac presently seems to feeling better. Problem #4  hypertension his blood pressure is reasonably controlled Problem #5 history of hyperlipidemia Problem #6 recent history of encephalopathy thought to be secondary to medication. Problem #7 history of renal CA Problem #8 history of COPD is on inhaler. Plan: Will dialyze patient today           We'll use 2K 2.5 calcium bath           We'll try to remove about 3 L his blood pressure tolerates.            We'll continue his other medications as before.  Lashina Milles S 03/11/2012, 7:04 AM

## 2012-03-12 ENCOUNTER — Inpatient Hospital Stay (HOSPITAL_COMMUNITY): Payer: Medicare Other

## 2012-03-12 ENCOUNTER — Encounter (HOSPITAL_COMMUNITY): Payer: Self-pay

## 2012-03-12 ENCOUNTER — Encounter (HOSPITAL_COMMUNITY): Payer: Self-pay | Admitting: Cardiology

## 2012-03-12 DIAGNOSIS — R55 Syncope and collapse: Secondary | ICD-10-CM

## 2012-03-12 LAB — BASIC METABOLIC PANEL
BUN: 32 mg/dL — ABNORMAL HIGH (ref 6–23)
Chloride: 94 mEq/L — ABNORMAL LOW (ref 96–112)
Creatinine, Ser: 6.75 mg/dL — ABNORMAL HIGH (ref 0.50–1.35)
GFR calc Af Amer: 8 mL/min — ABNORMAL LOW (ref >90)
Glucose, Bld: 90 mg/dL (ref 70–99)
Potassium: 4.7 mEq/L (ref 3.5–5.1)

## 2012-03-12 LAB — CBC
HCT: 30.2 % — ABNORMAL LOW (ref 39.0–52.0)
Hemoglobin: 9.2 g/dL — ABNORMAL LOW (ref 13.0–17.0)
MCH: 32.2 pg (ref 26.0–34.0)
MCHC: 30.5 g/dL (ref 30.0–36.0)
RDW: 17.1 % — ABNORMAL HIGH (ref 11.5–15.5)

## 2012-03-12 MED ORDER — TECHNETIUM TC 99M TETROFOSMIN IV KIT
10.0000 | PACK | Freq: Once | INTRAVENOUS | Status: AC | PRN
  Administered 2012-03-12: 9 via INTRAVENOUS

## 2012-03-12 MED ORDER — SODIUM CHLORIDE 0.9 % IJ SOLN
INTRAMUSCULAR | Status: AC
  Administered 2012-03-12: 10 mL via INTRAVENOUS
  Filled 2012-03-12: qty 10

## 2012-03-12 MED ORDER — FAMOTIDINE 20 MG PO TABS
20.0000 mg | ORAL_TABLET | Freq: Two times a day (BID) | ORAL | Status: DC
  Administered 2012-03-12 (×2): 20 mg via ORAL
  Filled 2012-03-12 (×2): qty 1

## 2012-03-12 MED ORDER — TECHNETIUM TC 99M TETROFOSMIN IV KIT
30.0000 | PACK | Freq: Once | INTRAVENOUS | Status: AC | PRN
  Administered 2012-03-12: 30.4 via INTRAVENOUS

## 2012-03-12 MED ORDER — REGADENOSON 0.4 MG/5ML IV SOLN
INTRAVENOUS | Status: AC
  Administered 2012-03-12: 0.4 mg via INTRAVENOUS
  Filled 2012-03-12: qty 5

## 2012-03-12 NOTE — Progress Notes (Addendum)
SUBJECTIVE:Breathing better since having dialysis, no further chest discomfort. Examined prior to pharmacologic stress test.  LABS: Basic Metabolic Panel:  Basename 03/12/12 0500 03/11/12 0530  NA 137 136  K 4.7 5.9*  CL 94* 93*  CO2 33* 35*  GLUCOSE 90 92  BUN 32* 45*  CREATININE 6.75* 9.03*  CALCIUM 8.7 8.7  MG -- --  PHOS -- --   No results found for this basename: LIPASE:2,AMYLASE:2 in the last 72 hours CBC:  Basename 03/12/12 0500 03/11/12 0530 03/10/12 1436  WBC 4.3 4.5 --  NEUTROABS -- -- 2.2  HGB 9.2* 9.3* --  HCT 30.2* 30.5* --  MCV 105.6* 106.6* --  PLT 123* 150 --  Hemoglobin A1C:  Basename 03/10/12 1812  HGBA1C 4.5   Fasting Lipid Panel:  Basename 03/11/12 0530  CHOL 103  HDL 39*  LDLCALC 41  TRIG 161  CHOLHDL 2.6  LDLDIRECT --   RADIOLOGY: Dg Chest 2 View  03/10/2012    IMPRESSION: Increased interstitial prominence at both lung bases and possible small pleural effusions, suggesting mild edema or atypical infection.  No consolidation.   Echocardiogram 03/11/2012 Left ventricle: The cavity size was normal. There was mild concentric hypertrophy. Probable akinesis of the basal inferolateral myocardium. Other segments were hyperdynamic. EF exceeded 65%. - Aortic valve: Mildly calcified annulus. Mildly calcified leaflets. - Mitral valve: Calcified annulus. Mildly thickened leaflets . Left atrium: The atrium was mildly to moderately dilated. - Atrial septum: No defect or patent foramen ovale was identified. - Pulmonary arteries: PA peak pressure: 44mm Hg (S).  PHYSICAL EXAM BP 137/57  Pulse 97  Temp(Src) 98.2 F (36.8 C) (Oral)  Resp 20  Ht 5\' 8"  (1.727 m)  Wt 174 lb 6.1 oz (79.1 kg)  BMI 26.51 kg/m2  SpO2 93%WT Loss 9 lbs. General: Well developed, well nourished, in no acute distress Head: Eyes PERRLA, No xanthomas.   Normal cephalic and atramatic  Lungs: Clear bilaterally to auscultation and percussion. Heart: HRRR S1 S2, 2/6 holosystolic  murmur.  Pulses are 2+ & equal.            No carotid bruit. No JVD.  No abdominal bruits. No femoral bruits. Abdomen: Bowel sounds are positive, abdomen soft and non-tender without masses or                  Hernia's noted. Msk:  Back normal, normal gait. Normal strength and tone for age. Extremities: No clubbing, cyanosis or edema.  DP +1 Neuro: Alert and oriented X 3. Psych:  Good affect, responds appropriately  TELEMETRY: Reviewed telemetry pt in: SR with frequent PVC's  ASSESSMENT AND PLAN:  1.CAD: Planned stress myoview this am in the setting of recurrent chest pain and dyspnea.  Await results of              NM for decision on need for repeat cardiac cath.  2. CHF: Echo demonstrates normal EF with mildly elevated PAP. Left atrium was mildly to moderately elevated.  No wall motion abnormalities. Fluid overload from ESRD with need to have lower dry wt would be   appropriate. Wt loss 9 lbs with improvement of symptoms.   Bettey Mare. Lyman Bishop NP [facility] 03/12/2012, 10:25 AM   Cardiology Attending Patient interviewed and examined. Discussed with Joni Reining, NP.  Above note annotated and modified based upon my findings.  Stress nuclear study: Small and mild inferior and basilar inferolateral defect.  There was slight reversibility, but the perfusion defect was basically  fixed. LV systolic function is mildly impaired with an estimated ejection fraction 41%. These findings are consistent with prior inferior wall infarction with minimal residual ischemia.  This represents a low risk study, and no additional cardiology testing or treatment is required.  [place] Bing, MD 03/12/2012, 8:40 PM

## 2012-03-12 NOTE — Progress Notes (Signed)
Andrew [name]  MRN: [id number]  DOB/AGE: August 13, 1926 78 y.o.  Primary Care Physician:FUSCO,LAWRENCE J., MD, MD  Admit date: 03/10/2012  Chief Complaint:  Chief Complaint  Patient presents with  . Shortness of Breath    S-Pt presented on  03/10/2012 with  Chief Complaint  Patient presents with  . Shortness of Breath  .    Pt today feels better    Pt sis awaiting the results of his stress test. Pt offers no new complaints  Meds     . allopurinol  200 mg Oral Daily  . aspirin EC  325 mg Oral Daily  . atorvastatin  10 mg Oral Daily  . cinacalcet  30 mg Oral Daily  . docusate sodium  100 mg Oral BID  . enoxaparin  30 mg Subcutaneous Q24H  . epoetin alfa  10,000 Units Intravenous 3 times weekly  . famotidine  20 mg Oral BID  . gabapentin  100 mg Oral BID  . multivitamin  1 tablet Oral Daily  . pantoprazole  40 mg Oral Q1200  . polyethylene glycol  17 g Oral Daily  . regadenoson      . sevelamer  3,200 mg Oral TID WC  . sodium chloride  3 mL Intravenous Q12H  . sodium chloride             NWG:NFAOZ from the symptoms mentioned above,there are no other symptoms referable to all systems reviewed.  Physical Exam: Vital signs in last 24 hours: Temp:  [97.8 F (36.6 C)-98.6 F (37 C)] 98.2 F (36.8 C) (04/16 0438) Pulse Rate:  [83-103] 97  (04/16 0438) Resp:  [18-20] 20  (04/16 0438) BP: (98-145)/(47-95) 137/57 mmHg (04/16 0438) SpO2:  [93 %-98 %] 93 % (04/16 0438) FiO2 (%):  [2 %] 2 % (04/15 1415) Weight:  [174 lb 6.1 oz (79.1 kg)-177 lb 0.5 oz (80.3 kg)] 174 lb 6.1 oz (79.1 kg) (04/16 0438) Weight change: -1 lb 15.5 oz (-0.894 kg) Last BM Date: 03/10/12  Intake/Output from previous day: 04/15 0701 - 04/16 0700 In: 240 [P.O.:240] Out: 3000      Physical Exam: General- pt is awake,alert, oriented to time place and person Resp- No acute REsp distress, CTA B/L NO Rhonchi CVS- S1S2 regular in rate and rhythm GIT- BS+, soft, NT, ND EXT- NO LE Edema,  Cyanosis Access-Right AVF +arm               Right Leg AVG                 Lab Results: CBC  Basename 03/12/12 0500 03/11/12 0530  WBC 4.3 4.5  HGB 9.2* 9.3*  HCT 30.2* 30.5*  PLT 123* 150    BMET  Basename 03/12/12 0500 03/11/12 0530  NA 137 136  K 4.7 5.9*  CL 94* 93*  CO2 33* 35*  GLUCOSE 90 92  BUN 32* 45*  CREATININE 6.75* 9.03*  CALCIUM 8.7 8.7    MICRO No results found for this or any previous visit (from the past 240 hour(s)).    Lab Results  Component Value Date   CALCIUM 8.7 03/12/2012   CAION 1.04* 03/10/2012   PHOS 4.0 09/18/2011            Impression: 1)Renal   ESRD- ON HD ON MWF schedule Pt was dilayzed yesterday NO need of HD today.  2)HTN Target Organ damage  CKD CAD S/P CABG PVD    3)Anemia HGb at goal (9--11)  ON EPO during HD  4)CKD Mineral-Bone Disorder Secondary Hyperparathyroidism  Present- ON Sensipar Phosphorus at goal.-ON BInders   5)REsp- Admitted with Dyspnea                Etiology REsp Vs Fluid Overload vs Cardiac               NOw better   6)FEN  Hyperkalemic- Now Normokalemic NOrmonatremic   7)Acid base Co2 at goal   8) CAD- admitted with CHest pain               Primary team and Cardiology following.  Plan:  Agree with current tx and plan Will dialyze in am if stable     Carrianne Hyun S 03/12/2012, 9:25 AM

## 2012-03-12 NOTE — Progress Notes (Signed)
Stress Lab Nurses Notes - Andrew [name]  Andrew [name] 03/12/2012 Reason for doing test: CAD and Chest Pain Type of test: Steffanie Dunn , Inpatient Rm 317 Nurse performing test: Parke Poisson, RN Nuclear Medicine Tech: Lyndel Pleasure Echo Tech: Not Applicable MD performing test: R. Rothbart & Joni Reining NP Family MD: Sherwood [name] Test explained and consent signed: yes IV started: 20g jelco, Saline lock flushed, IV in progress from floor and No redness or edema Symptoms: lightheaded Treatment/Intervention: None Reason test stopped: protocol completed After recovery IV was: No redness or edema and Saline Lock flushed Patient to return to Nuc. Med at : 10:15 Patient discharged: Transported back to room 317 via wc Patient's Condition upon discharge was: stable Comments: During test BP 118/50 & HR 99. Recovery BP 112/48 & HR 96.  Symptoms resolved in recovery. Erskine Speed T

## 2012-03-12 NOTE — Progress Notes (Signed)
   CARE MANAGEMENT NOTE 03/12/2012  Patient:  NOHLAN, BURDIN   Account Number:  [id number]  Date Initiated:  03/12/2012  Documentation initiated by:  Rosemary Holms  Subjective/Objective Assessment:   pt admitted with SOB. On Dialysis M, W, F. PTA, lived at home with wife. and independent.     Action/Plan:   Spoke to pt at bedside. He is anxious to go home. No needs HH DME needs anticipated at this time.   Anticipated DC Date:  03/13/2012   Anticipated DC Plan:  HOME/SELF CARE      DC Planning Services  CM consult      Choice offered to / List presented to:             Status of service:  In process, will continue to follow Medicare Important Message given?   (If response is "NO", the following Medicare IM given date fields will be blank) Date Medicare IM given:   Date Additional Medicare IM given:    Discharge Disposition:    Per UR Regulation:    If discussed at Long Length of Stay Meetings, dates discussed:    Comments:  03/12/12 1400 Norva Bowe Leanord Hawking RN BSN CM

## 2012-03-12 NOTE — Progress Notes (Signed)
Subjective: Patient denies any pain and shortness of breath.  Feels good.  Objective: Vital signs in last 24 hours: Temp:  [97.8 F (36.6 C)-98.6 F (37 C)] 97.8 F (36.6 C) (04/16 1432) Pulse Rate:  [87-97] 87  (04/16 1432) Resp:  [19-20] 19  (04/16 1432) BP: (117-137)/(47-57) 117/47 mmHg (04/16 1432) SpO2:  [93 %-97 %] 95 % (04/16 1432) Weight:  [79.1 kg (174 lb 6.1 oz)] 79.1 kg (174 lb 6.1 oz) (04/16 0438) Weight change: -0.894 kg (-1 lb 15.5 oz) Last BM Date: 03/10/12  Intake/Output from previous day: 04/15 0701 - 04/16 0700 In: 240 [P.O.:240] Out: 3000      Physical Exam: General: Alert, awake, oriented x3, in no acute distress. HEENT: No bruits, no goiter. Heart: Regular rate and rhythm, without murmurs, rubs, gallops. Lungs: Clear to auscultation bilaterally. Abdomen: Soft, nontender, nondistended, positive bowel sounds. Extremities: No clubbing cyanosis or edema with positive pedal pulses. Neuro: Grossly intact, nonfocal.    Lab Results: Basic Metabolic Panel:  Basename 03/12/12 0500 03/11/12 0530  NA 137 136  K 4.7 5.9*  CL 94* 93*  CO2 33* 35*  GLUCOSE 90 92  BUN 32* 45*  CREATININE 6.75* 9.03*  CALCIUM 8.7 8.7  MG -- --  PHOS -- --   Liver Function Tests: No results found for this basename: AST:2,ALT:2,ALKPHOS:2,BILITOT:2,PROT:2,ALBUMIN:2 in the last 72 hours No results found for this basename: LIPASE:2,AMYLASE:2 in the last 72 hours No results found for this basename: AMMONIA:2 in the last 72 hours CBC:  Basename 03/12/12 0500 03/11/12 0530 03/10/12 1436  WBC 4.3 4.5 --  NEUTROABS -- -- 2.2  HGB 9.2* 9.3* --  HCT 30.2* 30.5* --  MCV 105.6* 106.6* --  PLT 123* 150 --   Cardiac Enzymes:  Basename 03/11/12 0915 03/11/12 0111 03/10/12 1812  CKTOTAL 35 43 57  CKMB 1.8 2.0 2.5  CKMBINDEX -- -- --  TROPONINI <0.30 <0.30 <0.30   BNP: No results found for this basename: PROBNP:3 in the last 72 hours D-Dimer:  [facility] 03/10/12 1436    DDIMER 6.30*   CBG: No results found for this basename: GLUCAP:6 in the last 72 hours Hemoglobin A1C:  Basename 03/10/12 1812  HGBA1C 4.5   Fasting Lipid Panel:  Basename 03/11/12 0530  CHOL 103  HDL 39*  LDLCALC 41  TRIG 161  CHOLHDL 2.6  LDLDIRECT --   Thyroid Function Tests:  Basename 03/10/12 1812  TSH 1.845  T4TOTAL --  FREET4 --  T3FREE --  THYROIDAB --   Anemia Panel: No results found for this basename: VITAMINB12,FOLATE,FERRITIN,TIBC,IRON,RETICCTPCT in the last 72 hours Coagulation:  Basename 03/10/12 1436  LABPROT 15.1  INR 1.17   Urine Drug Screen: Drugs of Abuse  No results found for this basename: labopia, cocainscrnur, labbenz, amphetmu, thcu, labbarb    Alcohol Level: No results found for this basename: ETH:2 in the last 72 hours Urinalysis: No results found for this basename: COLORURINE:2,APPERANCEUR:2,LABSPEC:2,PHURINE:2,GLUCOSEU:2,HGBUR:2,BILIRUBINUR:2,KETONESUR:2,PROTEINUR:2,UROBILINOGEN:2,NITRITE:2,LEUKOCYTESUR:2 in the last 72 hours  No results found for this or any previous visit (from the past 240 hour(s)).  Studies/Results: No results found.  Medications: Scheduled Meds:   . allopurinol  200 mg Oral Daily  . aspirin EC  325 mg Oral Daily  . atorvastatin  10 mg Oral Daily  . cinacalcet  30 mg Oral Daily  . docusate sodium  100 mg Oral BID  . enoxaparin  30 mg Subcutaneous Q24H  . epoetin alfa  10,000 Units Intravenous 3 times weekly  . famotidine  20 mg  Oral BID  . gabapentin  100 mg Oral BID  . multivitamin  1 tablet Oral Daily  . pantoprazole  40 mg Oral Q1200  . polyethylene glycol  17 g Oral Daily  . regadenoson      . sevelamer  3,200 mg Oral TID WC  . sodium chloride  3 mL Intravenous Q12H  . sodium chloride       Continuous Infusions:  PRN Meds:.acetaminophen, acetaminophen, albuterol, heparin, heparin, morphine, nitroGLYCERIN, ondansetron (ZOFRAN) IV, ondansetron, sevelamer, technetium tetrofosmin, technetium  tetrofosmin  Assessment/Plan:  Principal Problem:  *Chest pain on exertion Active Problems:  SECONDARY HYPERPARATHYROIDISM  ANEMIA  Hypertension  Hyperlipidemia  ESRD (end stage renal disease)  Arteriosclerotic cardiovascular disease (ASCVD)  Hyperkalemia  Acute diastolic congestive heart failure  Plan:  Had stress test today, results pending Had dialysis yesterday, feels better now Further disposition per cardiology.   LOS: 2 days   Josefine Fuhr [facility] Pager: [phone] 03/12/2012, 6:50 PM

## 2012-03-13 ENCOUNTER — Inpatient Hospital Stay (HOSPITAL_COMMUNITY): Payer: Medicare Other

## 2012-03-13 LAB — BASIC METABOLIC PANEL
BUN: 51 mg/dL — ABNORMAL HIGH (ref 6–23)
CO2: 32 mEq/L (ref 19–32)
Calcium: 8.5 mg/dL (ref 8.4–10.5)
Chloride: 94 mEq/L — ABNORMAL LOW (ref 96–112)
Creatinine, Ser: 8.76 mg/dL — ABNORMAL HIGH (ref 0.50–1.35)
GFR calc Af Amer: 6 mL/min — ABNORMAL LOW (ref >90)
GFR calc non Af Amer: 5 mL/min — ABNORMAL LOW (ref >90)
Glucose, Bld: 123 mg/dL — ABNORMAL HIGH (ref 70–99)
Potassium: 4.5 mEq/L (ref 3.5–5.1)
Sodium: 137 mEq/L (ref 135–145)

## 2012-03-13 LAB — PHOSPHORUS: Phosphorus: 3.4 mg/dL (ref 2.3–4.6)

## 2012-03-13 NOTE — Discharge Summary (Signed)
Physician Discharge Summary  Patient ID: Andrew [name] MRN: [id number] DOB/AGE: 76-Mar-1927 76 y.o.  Admit date: 03/10/2012 Discharge date: 03/13/2012  Primary Care Physician:  [name]., MD, MD   Discharge Diagnoses:    Principal Problem:  *Chest pain on exertion, felt to be due to presence of pulmonary edema Active Problems:  SECONDARY HYPERPARATHYROIDISM  ANEMIA  Hypertension  Hyperlipidemia  ESRD (end stage renal disease)  Arteriosclerotic cardiovascular disease (ASCVD)  Hyperkalemia  Acute diastolic congestive heart failure  Syncope    Medication List  As of 03/13/2012  4:20 PM   TAKE these medications         acetaminophen 500 MG tablet   Commonly known as: TYLENOL   Take 1,000 mg by mouth every 6 (six) hours as needed. For pain/headache      allopurinol 100 MG tablet   Commonly known as: ZYLOPRIM   Take 200 mg by mouth daily.      aspirin EC 81 MG tablet   Take 81 mg by mouth daily.      atorvastatin 10 MG tablet   Commonly known as: LIPITOR   Take 10 mg by mouth daily.      calcium carbonate 500 MG chewable tablet   Commonly known as: TUMS - dosed in mg elemental calcium   Chew 1 tablet by mouth daily as needed. For upset stomach      cinacalcet 30 MG tablet   Commonly known as: SENSIPAR   Take 30 mg by mouth daily.      COLCRYS 0.6 MG tablet   Generic drug: colchicine   Take 0.6 mg by mouth 2 (two) times daily. Prn for gout      DIALYVITE 800/ZINC PO   Take 1 tablet by mouth daily.      docusate sodium 100 MG capsule   Commonly known as: COLACE   Take 100 mg by mouth 2 (two) times daily.      gabapentin 100 MG capsule   Commonly known as: NEURONTIN   Take 100 mg by mouth 2 (two) times daily.      MIRALAX powder   Generic drug: polyethylene glycol powder   Take 17 g by mouth daily.      omeprazole 40 MG capsule   Commonly known as: PRILOSEC   Take 40 mg by mouth at bedtime as needed. For reflux      OVER THE COUNTER MEDICATION     Apply 1 application topically daily as needed. For dry feet  otc healing balm      sevelamer 800 MG tablet   Commonly known as: RENAGEL   Take 1,600-3,200 mg by mouth 4 (four) times daily. Patient takes 4 tablets with meals and 2 tablets with snacks           Discharge Exam: Blood pressure 101/49, pulse 93, temperature 98.7 F (37.1 C), temperature source Oral, resp. rate 20, height 5\' 8"  (1.727 m), weight 81.7 kg (180 lb 1.9 oz), SpO2 98.00%. NAD CTA B S1, S2, RRR Soft, NT, BS+ No edema b/l  Disposition and Follow-up:  Patient will discharge home and follow up with his primary doctor  Consults: Cardiology, Dr. Dietrich Pates   Significant Diagnostic Studies:  Nm Myocar Single W/planar W/wall Motion And Ef  03/13/2012  nm myoview pharmacologic stress  Ordering Physician: Joni Reining  Reading Physician: Tasley Bing  Clinical Data: 78 year old gentleman with coronary artery disease admitted to hospital with dyspnea and chest pressure in the setting of ESRD with  fluid overload.  NUCLEAR MEDICINE ADENOSINE STRESS MYOVIEW STUDY WITH SPECT AND LEFT VENTRIUCLAR EJECTION FRACTION  Radionuclide Data: One-day rest/stress protocol performed with 10/30 mCi of Tc-64m Myoview.  Stress Data: Regadenoson infusion resulted in lightheadedness. There was a modest decrease in systolic blood pressure and minimal increase in heart rate following drug administration.  No arrhythmias noted.  EKG: Normal sinus rhythm; intra-atrial conduction delay; nonspecific T-wave abnormality.  No significant change with pharmacologic stress.  Scintigraphic Data: Acquisition notable for mild diaphragmatic attenuation.   The left ventricle was mildly dilated.  Borderline criteria for transient ischemic dilatation were present.  There was overlap of moderately intense splenic activity and the basilar inferior wall.  On tomographic images reconstructed in standard planes, there was a small and mild defect in the inferior  wall and at the base of the inferolateral segment.  Although slight reversibility may have been present, this was basically a fixed perfusion abnormality.  The gated reconstruction demonstrated moderate to severe hypokinesis of the base of the septum and mild inferoseptal hypokinesis.  Overall left ventricular systolic function was mildly impaired with an estimated ejection fraction of 41%.  There was normal systolic accentuation of activity except at the very base of the inferior wall.  IMPRESSION: Abnormal pharmacologic stress nuclear study revealing no stress induced EKG abnormalities, mild left ventricular dilatation and mild impairment of left ventricular systolic function in a segmental pattern.  There was borderline transient ischemic dilatation and non-transmural inferior and inferolateral scarring, perhaps with minimal superimposed ischemia.  Other findings as noted.  Original Report Authenticated By: Marianna Fuss    Brief H and P: For complete details please refer to admission H and P, but in brief This is 78 year old gentleman with history of end-stage renal disease on hemodialysis who was recently in the hospital he was found to be delirious. It was found that his encephalopathy at that time was likely secondary to recent anesthesia he received for his AV graft placement. Patient was discharged home in stable condition. He returns to the emergency room today with complaints of chest pain. He reports this as a pressure tightness comfort her current in the substernal area. He describes it as nonradiating, worse with exertion. He has not noticed any relation to food. Denies any nausea, vomiting, diaphoresis. He also does feel short of breath which is also worse with exertion. This may be more of a chronic issue. He reports onset of symptoms a few days ago. These have been intermittent. He reports his symptoms occurring with minimal exertion, he had them when he walked to the bathroom. Although yesterday he  went shopping and did not have any recurrence of symptoms. He reports minimal cough, no fever. He reports that he may be developing a cold. He has not noted that his pain worsens with deep breath, coughing. The patient does have a history of coronary artery disease without previous CABG surgery done 15 years ago. He has not had any recurrence since then. He's not had any recent cardiac testing. He's been referred for admission.     Hospital Course:  This gentleman was admitted to the hospital with complaints of chest pain and dyspnea on exertion. He has a history of coronary artery disease with bypass graft approximately 15 years ago. He also has a history of incisional disease on hemodialysis. Patient was admitted to the telemetry unit. His cardiac markers were negative x3. He was seen in consultation by the cardiology. 2-D echocardiogram was done which showed probable akinesis of the basal  inferolateral myocardium with ejection fraction exceeding 65%. This was followed up by pharmacologic stress test. Results demonstrated small and mild inferior and basilar inferolateral defect. There was slight reversibility, with a perfusion defect was basically 6. Ejection fraction was estimated at 41%. These findings are consistent with prior inferior wall infarction with minimal residual ischemia. It represented a lower study and no further cardiology testing was recommended. Patient felt markedly improved after his first treatment of hemodialysis. Approximately 3 L of fluid was removed the patient reports improvement in his shortness of breath and chest discomfort. It is likely that his symptoms were related to some mild volume overload. His travel he was lowered to 81 kg. He'll followup with his outpatient dialysis center for further treatment. Patient was felt safe to discharge home and followup with his primary care doctor.  Time spent on Discharge:  Signed: Miyeko Mahlum [facility] Pager:  [phone] 03/13/2012, 4:20 PM

## 2012-03-13 NOTE — Progress Notes (Signed)
Andrew [name]  MRN: [id number]  DOB/AGE: [birth year] 78 y.o.  Primary Care Physician:[name],Andrew J., MD, MD  Admit date: 03/10/2012  Chief Complaint:  Chief Complaint  Patient presents with  . Shortness of Breath    S-Pt presented on  03/10/2012 with  Chief Complaint  Patient presents with  . Shortness of Breath  .    Pt today feels better.    Pt seen on HD    Pt tolerating the tx well.   Meds     . allopurinol  200 mg Oral Daily  . aspirin EC  325 mg Oral Daily  . atorvastatin  10 mg Oral Daily  . b complex-vitamin c-folic acid  1 tablet Oral Daily  . cinacalcet  30 mg Oral Daily  . docusate sodium  100 mg Oral BID  . enoxaparin  30 mg Subcutaneous Q24H  . epoetin alfa  10,000 Units Intravenous 3 times weekly  . famotidine  20 mg Oral BID  . gabapentin  100 mg Oral BID  . pantoprazole  40 mg Oral Q1200  . polyethylene glycol  17 g Oral Daily  . sevelamer  3,200 mg Oral TID WC  . sodium chloride  3 mL Intravenous Q12H         [id number]:[id number] from the symptoms mentioned above,there are no other symptoms referable to all systems reviewed.  Physical Exam: Vital signs in last 24 hours: Temp:  [98 F (36.7 C)-98.7 F (37.1 C)] 98.7 F (37.1 C) (04/17 1430) Pulse Rate:  [70-98] 93  (04/17 1445) Resp:  [18-20] 20  (04/17 1430) BP: (74-144)/(35-89) 101/49 mmHg (04/17 1445) SpO2:  [93 %-98 %] 98 % (04/17 1424) Weight:  [179 lb 14.3 oz (81.6 kg)-180 lb 1.9 oz (81.7 kg)] 180 lb 1.9 oz (81.7 kg) (04/17 1018) Weight change: 2 lb 13.9 oz (1.3 kg) Last BM Date: 03/13/12  Intake/Output from previous day: 04/16 0701 - 04/17 0700 In: 120 [P.O.:120] Out: -  Total I/O In: 480 [P.O.:480] Out: 2498 [Other:2498]   Physical Exam: General- pt is awake,alert, oriented to time place and person  Resp- No acute REsp distress, CTA B/L NO Rhonchi  CVS- S1S2 regular in rate and rhythm  GIT- BS+, soft, NT, ND  EXT- NO LE Edema, Cyanosis  Access-Right AVF +arm two needles in  situ Right Leg AVG   Lab Results: CBC  Basename 03/12/12 0500 03/11/12 0530  WBC 4.3 4.5  HGB 9.2* 9.3*  HCT 30.2* 30.5*  PLT 123* 150    BMET  Basename 03/13/12 0527 03/12/12 0500  NA 137 137  K 4.5 4.7  CL 94* 94*  CO2 32 33*  GLUCOSE 123* 90  BUN 51* 32*  CREATININE 8.76* 6.75*  CALCIUM 8.5 8.7    MICRO No results found for this or any previous visit (from the past 240 hour(s)).    Lab Results  Component Value Date   CALCIUM 8.5 03/13/2012   CAION 1.04* 03/10/2012   PHOS 3.4 03/13/2012               Impression: 1)Renal  ESRD- ON HD  ON MWF schedule  Pt is currently being dialyzed Pt tolerating the tx well.   2)HTN  Target Organ damage  CKD  CAD S/P CABG  PVD   3)Anemia HGb at goal (9--11)  ON EPO during HD   4)CKD Mineral-Bone Disorder  Secondary Hyperparathyroidism Present- ON Sensipar  Phosphorus at goal.-ON BInders   5)REsp- Admitted with Dyspnea  Etiology REsp Vs  Fluid Overload vs Cardiac  NOw better   6)FEN  Hyperkalemic- Now Normokalemic  NOrmonatremic   7)Acid base  Co2 at goal   8) CAD- admitted with CHest pain  Primary team and Cardiology following.     Plan:  Agree with current tx and plan       [name] S 03/13/2012, 3:25 PM

## 2012-03-13 NOTE — Discharge Instructions (Signed)
Chest Pain (Nonspecific) It is often hard to give a specific diagnosis for the cause of chest pain. There is always a chance that your pain could be related to something serious, such as a heart attack or a blood clot in the lungs. You need to follow up with your caregiver for further evaluation. CAUSES   Heartburn.   Pneumonia or bronchitis.   Anxiety or stress.   Inflammation around your heart (pericarditis) or lung (pleuritis or pleurisy).   A blood clot in the lung.   A collapsed lung (pneumothorax). It can develop suddenly on its own (spontaneous pneumothorax) or from injury (trauma) to the chest.   Shingles infection (herpes zoster virus).  The chest wall is composed of bones, muscles, and cartilage. Any of these can be the source of the pain.  The bones can be bruised by injury.   The muscles or cartilage can be strained by coughing or overwork.   The cartilage can be affected by inflammation and become sore (costochondritis).  DIAGNOSIS  Lab tests or other studies, such as X-rays, electrocardiography, stress testing, or cardiac imaging, may be needed to find the cause of your pain.  TREATMENT   Treatment depends on what may be causing your chest pain. Treatment may include:   Acid blockers for heartburn.   Anti-inflammatory medicine.   Pain medicine for inflammatory conditions.   Antibiotics if an infection is present.   You may be advised to change lifestyle habits. This includes stopping smoking and avoiding alcohol, caffeine, and chocolate.   You may be advised to keep your head raised (elevated) when sleeping. This reduces the chance of acid going backward from your stomach into your esophagus.   Most of the time, nonspecific chest pain will improve within 2 to 3 days with rest and mild pain medicine.  HOME CARE INSTRUCTIONS   If antibiotics were prescribed, take your antibiotics as directed. Finish them even if you start to feel better.   For the next few  days, avoid physical activities that bring on chest pain. Continue physical activities as directed.   Do not smoke.   Avoid drinking alcohol.   Only take over-the-counter or prescription medicine for pain, discomfort, or fever as directed by your caregiver.   Follow your caregiver's suggestions for further testing if your chest pain does not go away.   Keep any follow-up appointments you made. If you do not go to an appointment, you could develop lasting (chronic) problems with pain. If there is any problem keeping an appointment, you must call to reschedule.  SEEK MEDICAL CARE IF:   You think you are having problems from the medicine you are taking. Read your medicine instructions carefully.   Your chest pain does not go away, even after treatment.   You develop a rash with blisters on your chest.  SEEK IMMEDIATE MEDICAL CARE IF:   You have increased chest pain or pain that spreads to your arm, neck, jaw, back, or abdomen.   You develop shortness of breath, an increasing cough, or you are coughing up blood.   You have severe back or abdominal pain, feel nauseous, or vomit.   You develop severe weakness, fainting, or chills.   You have a fever.  THIS IS AN EMERGENCY. Do not wait to see if the pain will go away. Get medical help at once. Call your local emergency services (911 in [place] Do not drive yourself to the hospital. MAKE SURE YOU:   Understand these instructions.     Will watch your condition.   Will get help right away if you are not doing well or get worse.  Document Released: 08/23/2005 Document Revised: 11/02/2011 Document Reviewed: 06/18/2008 [employer] Patient Information 2012 [employer] 

## 2012-03-13 NOTE — Progress Notes (Signed)
Patient discharged home with daughter.  Patient instructed to follow up with PCP.  Instructed to call MD if having any uncontrolled, severe pain.  Patient verbalizes understanding of discharge instructions.  No questions at this time

## 2012-03-14 LAB — PTH, INTACT AND CALCIUM
Calcium, Total (PTH): 7.8 mg/dL — ABNORMAL LOW (ref 8.4–10.5)
PTH: 229.7 pg/mL — ABNORMAL HIGH (ref 14.0–72.0)

## 2012-03-25 ENCOUNTER — Encounter: Payer: Self-pay | Admitting: Vascular Surgery

## 2012-03-26 ENCOUNTER — Other Ambulatory Visit: Payer: Self-pay | Admitting: *Deleted

## 2012-03-26 ENCOUNTER — Ambulatory Visit (INDEPENDENT_AMBULATORY_CARE_PROVIDER_SITE_OTHER): Payer: Medicare Other | Admitting: Vascular Surgery

## 2012-03-26 ENCOUNTER — Encounter: Payer: Self-pay | Admitting: Vascular Surgery

## 2012-03-26 VITALS — BP 165/70 | HR 90 | Resp 20 | Ht 68.0 in | Wt 178.0 lb

## 2012-03-26 DIAGNOSIS — N186 End stage renal disease: Secondary | ICD-10-CM

## 2012-03-26 NOTE — Progress Notes (Signed)
The patient presents today for a discussion regarding AV access. He is currently using his right upper arm AV fistula with known central venous occlusion. He is having progressive changes of swelling in his right arm. He did have placement of a right femoral loop AV Gore-Tex graft 3-1/2 weeks ago. Since healing quite nicely.  On physical exam he does have complete healing of the incisions in his right groin and thigh and has excellent flow through the fistula with no evidence of surrounding fluid.  I feel it is appropriate to begin using his access have given him written note that the patient to present to his dialysis Center tomorrow to begin using his femoral loop beginning tomorrow. We have tentatively scheduled him for ligation of his right arm fistula due to venous hypertension on 03/31/2012 at the hospital as an outpatient. They understand that if he has any difficulty in using his arm or loop graft that we will defer ligation of his upper arm fistula

## 2012-03-27 ENCOUNTER — Encounter (HOSPITAL_COMMUNITY): Payer: Self-pay | Admitting: Pharmacy Technician

## 2012-04-03 ENCOUNTER — Encounter (HOSPITAL_COMMUNITY): Payer: Self-pay

## 2012-04-03 MED ORDER — DEXTROSE 5 % IV SOLN
1.5000 g | INTRAVENOUS | Status: AC
  Administered 2012-04-04: 1.5 g via INTRAVENOUS
  Filled 2012-04-03: qty 1.5

## 2012-04-04 ENCOUNTER — Encounter (HOSPITAL_COMMUNITY)
Admission: RE | Disposition: A | Discharge status: Home / Self Care | Payer: Self-pay | Source: Ambulatory Visit | Attending: Vascular Surgery

## 2012-04-04 ENCOUNTER — Ambulatory Visit (HOSPITAL_COMMUNITY): Payer: Medicare Other

## 2012-04-04 ENCOUNTER — Ambulatory Visit (HOSPITAL_COMMUNITY): Payer: Medicare Other | Admitting: Anesthesiology

## 2012-04-04 ENCOUNTER — Encounter (HOSPITAL_COMMUNITY): Payer: Self-pay | Admitting: *Deleted

## 2012-04-04 ENCOUNTER — Ambulatory Visit (HOSPITAL_COMMUNITY)
Admission: RE | Admit: 2012-04-04 | Discharge: 2012-04-04 | Disposition: A | Discharge status: Home / Self Care | Payer: Medicare Other | Source: Ambulatory Visit | Attending: Vascular Surgery | Admitting: Vascular Surgery

## 2012-04-04 ENCOUNTER — Encounter (HOSPITAL_COMMUNITY): Payer: Self-pay | Admitting: Anesthesiology

## 2012-04-04 ENCOUNTER — Telehealth: Payer: Self-pay | Admitting: Vascular Surgery

## 2012-04-04 DIAGNOSIS — Y832 Surgical operation with anastomosis, bypass or graft as the cause of abnormal reaction of the patient, or of later complication, without mention of misadventure at the time of the procedure: Secondary | ICD-10-CM | POA: Insufficient documentation

## 2012-04-04 DIAGNOSIS — T82898A Other specified complication of vascular prosthetic devices, implants and grafts, initial encounter: Secondary | ICD-10-CM

## 2012-04-04 DIAGNOSIS — I871 Compression of vein: Secondary | ICD-10-CM | POA: Insufficient documentation

## 2012-04-04 DIAGNOSIS — N186 End stage renal disease: Secondary | ICD-10-CM | POA: Insufficient documentation

## 2012-04-04 DIAGNOSIS — I120 Hypertensive chronic kidney disease with stage 5 chronic kidney disease or end stage renal disease: Secondary | ICD-10-CM | POA: Insufficient documentation

## 2012-04-04 DIAGNOSIS — Z951 Presence of aortocoronary bypass graft: Secondary | ICD-10-CM | POA: Insufficient documentation

## 2012-04-04 DIAGNOSIS — M109 Gout, unspecified: Secondary | ICD-10-CM | POA: Insufficient documentation

## 2012-04-04 DIAGNOSIS — E785 Hyperlipidemia, unspecified: Secondary | ICD-10-CM | POA: Insufficient documentation

## 2012-04-04 DIAGNOSIS — K219 Gastro-esophageal reflux disease without esophagitis: Secondary | ICD-10-CM | POA: Insufficient documentation

## 2012-04-04 DIAGNOSIS — N2581 Secondary hyperparathyroidism of renal origin: Secondary | ICD-10-CM | POA: Insufficient documentation

## 2012-04-04 DIAGNOSIS — I2510 Atherosclerotic heart disease of native coronary artery without angina pectoris: Secondary | ICD-10-CM | POA: Insufficient documentation

## 2012-04-04 HISTORY — PX: OTHER SURGICAL HISTORY: SHX169

## 2012-04-04 LAB — POCT I-STAT 4, (NA,K, GLUC, HGB,HCT)
Glucose, Bld: 88 mg/dL (ref 70–99)
HCT: 38 % — ABNORMAL LOW (ref 39.0–52.0)
Hemoglobin: 12.9 g/dL — ABNORMAL LOW (ref 13.0–17.0)
Potassium: 4.7 mEq/L (ref 3.5–5.1)
Sodium: 138 mEq/L (ref 135–145)

## 2012-04-04 SURGERY — LIGATION OF ARTERIOVENOUS  FISTULA
Anesthesia: Monitor Anesthesia Care | Site: Arm Upper | Laterality: Right | Wound class: Clean

## 2012-04-04 MED ORDER — SODIUM CHLORIDE 0.9 % IV SOLN
INTRAVENOUS | Status: DC | PRN
  Administered 2012-04-04: 14:00:00 via INTRAVENOUS

## 2012-04-04 MED ORDER — ONDANSETRON HCL 4 MG/2ML IJ SOLN: 4.0000 mg | Freq: Once | INTRAMUSCULAR | Status: DC | PRN

## 2012-04-04 MED ORDER — OXYCODONE HCL 5 MG PO TABS: 5.0000 mg | ORAL_TABLET | ORAL | Status: AC | PRN

## 2012-04-04 MED ORDER — FENTANYL CITRATE 0.05 MG/ML IJ SOLN
INTRAMUSCULAR | Status: DC | PRN
  Administered 2012-04-04: 100 ug via INTRAVENOUS

## 2012-04-04 MED ORDER — MIDAZOLAM HCL 5 MG/5ML IJ SOLN
INTRAMUSCULAR | Status: DC | PRN
  Administered 2012-04-04: 2 mg via INTRAVENOUS

## 2012-04-04 MED ORDER — MORPHINE SULFATE 4 MG/ML IJ SOLN: 0.0500 mg/kg | INTRAMUSCULAR | Status: DC | PRN

## 2012-04-04 MED ORDER — 0.9 % SODIUM CHLORIDE (POUR BTL) OPTIME
TOPICAL | Status: DC | PRN
  Administered 2012-04-04: 1000 mL

## 2012-04-04 MED ORDER — MUPIROCIN 2 % EX OINT
TOPICAL_OINTMENT | CUTANEOUS | Status: AC
  Administered 2012-04-04: 1
  Filled 2012-04-04: qty 22

## 2012-04-04 MED ORDER — HYDROMORPHONE HCL PF 1 MG/ML IJ SOLN: 0.2500 mg | INTRAMUSCULAR | Status: DC | PRN

## 2012-04-04 MED ORDER — LIDOCAINE-EPINEPHRINE (PF) 1 %-1:200000 IJ SOLN
INTRAMUSCULAR | Status: DC | PRN
  Administered 2012-04-04: 30 mL

## 2012-04-04 MED ORDER — SODIUM CHLORIDE 0.9 % IV SOLN
INTRAVENOUS | Status: DC
  Administered 2012-04-04: 14:00:00 via INTRAVENOUS

## 2012-04-04 SURGICAL SUPPLY — 36 items
ADH SKN CLS APL DERMABOND .7 (GAUZE/BANDAGES/DRESSINGS) ×1
CANISTER SUCTION 2500CC (MISCELLANEOUS) ×2 IMPLANT
CLIP TI MEDIUM 6 (CLIP) IMPLANT
CLIP TI WIDE RED SMALL 6 (CLIP) IMPLANT
CLOTH BEACON ORANGE TIMEOUT ST (SAFETY) ×2 IMPLANT
COVER SURGICAL LIGHT HANDLE (MISCELLANEOUS) ×4 IMPLANT
DERMABOND ADVANCED (GAUZE/BANDAGES/DRESSINGS) ×1
DERMABOND ADVANCED .7 DNX12 (GAUZE/BANDAGES/DRESSINGS) ×1 IMPLANT
ELECT REM PT RETURN 9FT ADLT (ELECTROSURGICAL) ×2
ELECTRODE REM PT RTRN 9FT ADLT (ELECTROSURGICAL) ×1 IMPLANT
GEL ULTRASOUND 20GR AQUASONIC (MISCELLANEOUS) IMPLANT
GLOVE BIO SURGEON STRL SZ 6.5 (GLOVE) ×2 IMPLANT
GLOVE BIOGEL PI IND STRL 7.5 (GLOVE) IMPLANT
GLOVE BIOGEL PI INDICATOR 7.5 (GLOVE) ×1
GLOVE SS BIOGEL STRL SZ 7 (GLOVE) ×1 IMPLANT
GLOVE SUPERSENSE BIOGEL SZ 7 (GLOVE) ×2
GOWN STRL NON-REIN LRG LVL3 (GOWN DISPOSABLE) ×4 IMPLANT
GOWN STRL REIN XL XLG (GOWN DISPOSABLE) ×1 IMPLANT
KIT BASIN OR (CUSTOM PROCEDURE TRAY) ×2 IMPLANT
KIT ROOM TURNOVER OR (KITS) ×2 IMPLANT
NS IRRIG 1000ML POUR BTL (IV SOLUTION) ×2 IMPLANT
PACK CV ACCESS (CUSTOM PROCEDURE TRAY) ×2 IMPLANT
PAD ARMBOARD 7.5X6 YLW CONV (MISCELLANEOUS) ×4 IMPLANT
SPONGE GAUZE 4X4 12PLY (GAUZE/BANDAGES/DRESSINGS) ×2 IMPLANT
SUT MNCRL AB 4-0 PS2 18 (SUTURE) ×1 IMPLANT
SUT PROLENE 5 0 C 1 24 (SUTURE) ×5 IMPLANT
SUT PROLENE 6 0 BV (SUTURE) ×1 IMPLANT
SUT SILK 0 TIES 10X30 (SUTURE) ×2 IMPLANT
SUT VIC AB 3-0 SH 27 (SUTURE) ×2
SUT VIC AB 3-0 SH 27X BRD (SUTURE) ×1 IMPLANT
SWAB COLLECTION DEVICE MRSA (MISCELLANEOUS) IMPLANT
TOWEL OR 17X24 6PK STRL BLUE (TOWEL DISPOSABLE) ×2 IMPLANT
TOWEL OR 17X26 10 PK STRL BLUE (TOWEL DISPOSABLE) ×2 IMPLANT
TUBE ANAEROBIC SPECIMEN COL (MISCELLANEOUS) IMPLANT
UNDERPAD 30X30 INCONTINENT (UNDERPADS AND DIAPERS) ×2 IMPLANT
WATER STERILE IRR 1000ML POUR (IV SOLUTION) ×1 IMPLANT

## 2012-04-04 NOTE — Anesthesia Postprocedure Evaluation (Signed)
Anesthesia Post Note  Patient: Andrew [name]  Procedure(s) Performed: Procedure(s) (LRB): LIGATION OF ARTERIOVENOUS  FISTULA (Right)  Anesthesia type: general  Patient location: PACU  Post pain: Pain level controlled  Post assessment: Patient's Cardiovascular Status Stable  Last Vitals:  Filed Vitals:   04/04/12 1555  BP: 125/48  Pulse: 72  Temp:   Resp: 11    Post vital signs: Reviewed and stable  Level of consciousness: sedated  Complications: No apparent anesthesia complications

## 2012-04-04 NOTE — H&P (Signed)
[facility] [place]  Brief Access History and Physical  History of Present Illness  Andrew [name] is a 78 y.o. male who presents with chief complaint: right arm swelling.  Pt has known right central venous occlusion with patent right brachiocephalic fistula.  The patient presents today for ligation of Right brachiocephalic arteriovenous fistula.    Past Medical History  Diagnosis Date  . Secondary hyperparathyroidism   . Peripheral vascular disease   . Hyperlipidemia   . Duodenal ulcer     remote  . Gout STABLE  . ESRD (end stage renal disease) NEPHROLOGIST-   DR [name]    Hemodialysis; Hx of bleeding from aneurysms on AVF  . Arteriosclerotic cardiovascular disease (ASCVD) 1998    CABG-1998; h/o CHF  . Cerebrovascular disease REMOTE --  PER CHART--    NO RESIDUAL  . Neuropathy of foot COLD FEET  . Degenerative joint disease   . Gastroesophageal reflux disease   . History of bladder cancer   . Impaired hearing BILATERAL     ONLY WEARS LEFT HEARING AID  . Blood transfusion   . Anemia   . Renal cell carcinoma 2001    s/p right nephrectomy-2001; subsequent ESRD  . Hypertension   . CHF (congestive heart failure)   . Renal insufficiency   . Coronary artery disease     Past Surgical History  Procedure Date  . Right nephrectomy 2001    Renal cell carcinoma  . Colonoscopy 01/24/2011    prominent vascular pattern, suboptimal prep but doable  . Esophagogastroduodenoscopy 01/24/2011    mild erosive reflux esophagitis, bulbar/antral erosions, bx from antrum benign  . Av fistula repair 2008    revision of anastomosis of Right AVF  . Arteriovenous graft placement 2006    Thrombectomy and interposition jump graft revision to higher axillary vein of LUA AVG  . Coronary artery bypass graft 1998    X4 VESSELS  . Multiple cysto/ resection tumor's with bx's LAST ONE 05-09-2011  . Multiple surg's / interventions for avgg/ fistula right upper arm LAST REVISION  06-29-2011    (JUNE 2010 STENT CEPHALIC VEIN/ 03-23-2011 DILATATION ANGIOPLASTY)  . Left ptc left renal artery   . Cardiac catheterization 2004  . Cataract extraction w/ intraocular lens  implant, bilateral   . Cystoscopy w/ retrogrades 12/28/2011    Procedure: CYSTOSCOPY WITH RETROGRADE PYELOGRAM;  Surgeon: [name], MD;  Location: [facility];  Service: Urology;  Laterality: Left;  C-ARM   . Transurethral resection of bladder tumor 12/28/2011    Procedure: TRANSURETHRAL RESECTION OF BLADDER TUMOR (TURBT);  Surgeon: [name], MD;  Location: [facility];  Service: Urology;  Laterality: N/A;  . Av fistula placement 02/29/2012    Procedure: INSERTION OF ARTERIOVENOUS (AV) GORE-TEX GRAFT THIGH;  Surgeon: [name], MD;  Location: [facility] OR;  Service: Vascular;  Laterality: Right;  Insertion right femoral arteriovenous gortex graft    History   Social History  . Marital Status: Widowed    Spouse Name: N/A    Number of Children: 3  . Years of Education: N/A   Occupational History  . Retired    Social History Main Topics  . Smoking status: Former Smoker -- 1.0 packs/day for 25 years    Types: Cigarettes    Quit date: 02/26/1984  . Smokeless tobacco: Never Used   Comment: quit 1985  . Alcohol Use: No  . Drug Use: No  . Sexually Active: Not Currently  Other Topics Concern  . Not on file   Social History Narrative  . No narrative on file    Family History  Problem Relation Age of Onset  . Colon cancer Neg Hx   . Liver disease Neg Hx   . Anesthesia problems Neg Hx   . Heart disease Father     No current facility-administered medications on file prior to encounter.   Current Outpatient Prescriptions on File Prior to Encounter  Medication Sig Dispense Refill  . allopurinol (ZYLOPRIM) 100 MG tablet Take 200 mg by mouth daily.       Marland Kitchen aspirin EC 81 MG tablet Take 81 mg by mouth daily.      Marland Kitchen atorvastatin (LIPITOR) 10 MG tablet Take 10 mg by  mouth daily.       . B Complex-C-Zn-Folic Acid (DIALYVITE 800/ZINC PO) Take 1 tablet by mouth daily.       . calcium carbonate (TUMS - DOSED IN MG ELEMENTAL CALCIUM) 500 MG chewable tablet Chew 1 tablet by mouth daily as needed. For upset stomach      . cinacalcet (SENSIPAR) 30 MG tablet Take 30 mg by mouth daily.       . colchicine (COLCRYS) 0.6 MG tablet Take 0.6 mg by mouth 2 (two) times daily as needed. for gout      . docusate sodium (COLACE) 100 MG capsule Take 100 mg by mouth 2 (two) times daily.      Marland Kitchen gabapentin (NEURONTIN) 100 MG capsule Take 100 mg by mouth 2 (two) times daily.      Marland Kitchen omeprazole (PRILOSEC) 40 MG capsule Take 40 mg by mouth at bedtime as needed. For reflux      . OVER THE COUNTER MEDICATION Apply 1 application topically daily as needed. For dry feet  otc healing balm      . polyethylene glycol powder (MIRALAX) powder Take 17 g by mouth daily.       . sevelamer (RENAGEL) 800 MG tablet Take 1,600-3,200 mg by mouth 4 (four) times daily. Patient takes 4 tablets with meals and 2 tablets with snacks      . acetaminophen (TYLENOL) 500 MG tablet Take 1,000 mg by mouth every 6 (six) hours as needed. For pain/headache        No Known Allergies  Review of Systems: Kidney Disease, As listed above, otherwise negative.  Physical Examination  Filed Vitals:   04/03/12 1703 04/04/12 0753  BP:  144/64  Pulse:  85  Temp:  98 F (36.7 C)  TempSrc:  Oral  Resp:  18  Height: 5\' 8"  (1.727 m)   Weight: 176 lb (79.833 kg)   SpO2:  99%   Body mass index is 26.76 kg/(m^2).  General: A&O x 3, WDWN  Pulmonary: Sym exp, good air movt, CTAB, no rales, rhonchi, & wheezing  Cardiac: RRR, Nl S1, S2, no Murmurs, rubs or gallops  Gastrointestinal: soft, NTND, -G/R, - HSM, - masses, - CVAT B  Musculoskeletal: M/S 5/5 throughout , Extremities without ischemic changes , palpable thrill in R BC AVF and R thigh AVG  Laboratory See iStat  Medical Decision Making  ELVA BREAKER is a  78 y.o. male who presents with: venous outflow stenosis in R BC AVF  The patient is scheduled for: ligation of R BC AVF  Risk, benefits, and alternatives to access surgery were discussed.  The patient is aware the risks include but are not limited to: bleeding, infection, steal syndrome, nerve damage, ischemic monomelic  neuropathy, failure to mature, and need for additional procedures.  The patient is aware of the risks and agrees to proceed.  Leonides Sake, MD [facility] [place] Office: [phone] Pager: [phone]  04/04/2012, 2:01 PM

## 2012-04-04 NOTE — Anesthesia Preprocedure Evaluation (Addendum)
Anesthesia Evaluation  Patient identified by MRN, date of birth, ID band  Reviewed: Allergy & Precautions, H&P , NPO status , Patient's Chart, lab work & pertinent test results  Airway Mallampati: I TM Distance: >3 FB Neck ROM: Full    Dental   Pulmonary          Cardiovascular hypertension, Pt. on medications + CAD and + CABG     Neuro/Psych    GI/Hepatic GERD-  Medicated and Controlled,  Endo/Other    Renal/GU H/O renal cell CA and nephrectomy Now dialysis dependent     Musculoskeletal   Abdominal   Peds  Hematology   Anesthesia Other Findings   Reproductive/Obstetrics                          Anesthesia Physical Anesthesia Plan  ASA: III  Anesthesia Plan: MAC   Post-op Pain Management:    Induction: Intravenous  Airway Management Planned: Natural Airway  Additional Equipment:   Intra-op Plan:   Post-operative Plan:   Informed Consent: I have reviewed the patients History and Physical, chart, labs and discussed the procedure including the risks, benefits and alternatives for the proposed anesthesia with the patient or authorized representative who has indicated his/her understanding and acceptance.     Plan Discussed with: CRNA and Surgeon  Anesthesia Plan Comments:         Anesthesia Quick Evaluation

## 2012-04-04 NOTE — Op Note (Signed)
OPERATIVE NOTE   PROCEDURE: 1. Ligation of right brachiocephalic arteriovenous fistula   PRE-OPERATIVE DIAGNOSIS: Right arm swelling due to venous stenosis  POST-OPERATIVE DIAGNOSIS: same as above   SURGEON: Leonides Sake, MD  ASSISTANT(S): Lianne Cure, PAC   ANESTHESIA: local and MAC  ESTIMATED BLOOD LOSS: 50 cc  FINDING(S): 1. No further thrill in right upper arm fistula at end of case  SPECIMEN(S):  none  INDICATIONS:   Andrew [name] is a 78 y.o. male who  presents with known central venous stenosis and patent right brachiocephalic arteriovenous fistula.  The patient had a right thigh graft placed with the plan to ligate the right brachiocephalic arteriovenous fistula after the thigh graft was matured.  The patient presents for the right brachiocephalic fistula ligation today.  He is aware the risks include but are not limited to: bleeding, infection, and possible nerve damage..  DESCRIPTION: After obtain full informed written consent, the patient was brought back to the operating room and placed supine upon the operating table.  The patient received IV antibiotics prior to induction.  After obtaining adequate anesthesia, the patient was prepped and draped in the standard fashion for: right access procedure.  I injected 5 cc 1% lidocaine with epinephrine at the previous incision and dissected with electrocautery down to the anastomosis.  This was dissected out with some difficulty due to chronic scar tissue.  In pass a right angle around the fistula, the fistula tore slightly so I clamped the fistula near the anastomosis and distally.  I sharply opened the fistula, revealing the complete lumen of this fistula.  I oversewed this fistula on both ends with a double layer of 5-0 Prolene.  A few bleeding areas were noted, so I oversewed these areas with a figure of eight stitch of 5-0 Prolene.  I irrigated out the surgical wound, and there was no further bleeding.  Note, there is a very  short segment of vein on the brachial artery, effectively acting as a patch angioplasty.   The subcutaneous tissue was repaired with a running stitch of 3-0 Vicryl.  The skin was reapproximated with a running subcuticular of 4-0 Monocryl.    COMPLICATIONS: none  CONDITION: stable  Leonides Sake, MD [facility] [place] Office: [phone] Pager: [phone]  04/04/2012, 3:07 PM

## 2012-04-04 NOTE — Transfer of Care (Signed)
Immediate Anesthesia Transfer of Care Note  Patient: Andrew [name]  Procedure(s) Performed: Procedure(s) (LRB): LIGATION OF ARTERIOVENOUS  FISTULA (Right)  Patient Location: PACU  Anesthesia Type: MAC  Level of Consciousness: awake  Airway & Oxygen Therapy: Patient Spontanous Breathing and Patient connected to nasal cannula oxygen  Post-op Assessment: Report given to PACU RN, Post -op Vital signs reviewed and stable, Patient moving all extremities and Patient moving all extremities X 4  Post vital signs: Reviewed and stable  Complications: No apparent anesthesia complications

## 2012-04-04 NOTE — Telephone Encounter (Addendum)
Message copied by Rosalyn Charters on Thu Apr 04, 2012  4:08 PM ------      Message from: Melene Plan      Created: Thu Apr 04, 2012  3:45 PM                   ----- Message -----         From: Fransisco Hertz, MD         Sent: 04/04/2012   3:19 PM           To: Almetta Lovely, RN            Andrew [name]      [id number]      1926-05-08            Procedure: R BC AVF ligation            F/U: Dr Arbie Cookey in 2 weeks  Left voice message for patient informing him of his follow-up appointment 04-16-12 4pm

## 2012-04-04 NOTE — OR Nursing (Signed)
Pre-existing AVGG to right upper thigh. + bruit +thrill

## 2012-04-12 ENCOUNTER — Ambulatory Visit (INDEPENDENT_AMBULATORY_CARE_PROVIDER_SITE_OTHER): Payer: Medicare Other | Admitting: Urology

## 2012-04-12 DIAGNOSIS — Z8551 Personal history of malignant neoplasm of bladder: Secondary | ICD-10-CM

## 2012-04-12 DIAGNOSIS — Z85528 Personal history of other malignant neoplasm of kidney: Secondary | ICD-10-CM

## 2012-04-15 ENCOUNTER — Encounter: Payer: Self-pay | Admitting: Vascular Surgery

## 2012-04-16 ENCOUNTER — Ambulatory Visit (INDEPENDENT_AMBULATORY_CARE_PROVIDER_SITE_OTHER): Payer: Medicare Other | Admitting: Vascular Surgery

## 2012-04-16 ENCOUNTER — Encounter: Payer: Self-pay | Admitting: Vascular Surgery

## 2012-04-16 VITALS — BP 132/57 | HR 79 | Resp 18 | Ht 68.0 in | Wt 176.5 lb

## 2012-04-16 DIAGNOSIS — N186 End stage renal disease: Secondary | ICD-10-CM

## 2012-04-16 NOTE — Progress Notes (Signed)
The patient has today for followup of ligation of his right upper arm AV graft for venous hypertension. This graft been present for quite some time and he developed a markedly swollen right arm was occluded central vein. He had placement of a right femoral loop graft which is working quite nicely and after this was begin be accessed he did have ligation of his right upper arm AV graft at the antecubital level on 04/04/2012. He has had marked resolution with no evidence of significant swelling in his right arm versus his left arm. He does have some irritation from the thrombus in the graft and and a pseudoaneurysm in his mid upper arm. There is no evidence of infection. There is some mild erythema and tenderness. I discussed this at length with the patient and family present. I think this should resolve spontaneously. He will continue use of his femoral graft notify should he develop any difficulty in his upper arm

## 2012-04-21 ENCOUNTER — Inpatient Hospital Stay (HOSPITAL_COMMUNITY): Payer: Medicare Other

## 2012-04-21 ENCOUNTER — Inpatient Hospital Stay (HOSPITAL_COMMUNITY)
Admission: EM | Admit: 2012-04-21 | Discharge: 2012-04-22 | DRG: 682 | Disposition: A | Discharge status: Home / Self Care | Payer: Medicare Other | Attending: Internal Medicine | Admitting: Internal Medicine

## 2012-04-21 ENCOUNTER — Encounter (HOSPITAL_COMMUNITY): Payer: Self-pay | Admitting: Emergency Medicine

## 2012-04-21 ENCOUNTER — Emergency Department (HOSPITAL_COMMUNITY): Payer: Medicare Other

## 2012-04-21 DIAGNOSIS — I10 Essential (primary) hypertension: Secondary | ICD-10-CM | POA: Diagnosis present

## 2012-04-21 DIAGNOSIS — E785 Hyperlipidemia, unspecified: Secondary | ICD-10-CM | POA: Diagnosis present

## 2012-04-21 DIAGNOSIS — E875 Hyperkalemia: Secondary | ICD-10-CM

## 2012-04-21 DIAGNOSIS — J810 Acute pulmonary edema: Secondary | ICD-10-CM | POA: Diagnosis present

## 2012-04-21 DIAGNOSIS — N186 End stage renal disease: Secondary | ICD-10-CM | POA: Diagnosis present

## 2012-04-21 DIAGNOSIS — I509 Heart failure, unspecified: Secondary | ICD-10-CM | POA: Diagnosis present

## 2012-04-21 DIAGNOSIS — I120 Hypertensive chronic kidney disease with stage 5 chronic kidney disease or end stage renal disease: Principal | ICD-10-CM | POA: Diagnosis present

## 2012-04-21 DIAGNOSIS — Z8551 Personal history of malignant neoplasm of bladder: Secondary | ICD-10-CM

## 2012-04-21 DIAGNOSIS — I2510 Atherosclerotic heart disease of native coronary artery without angina pectoris: Secondary | ICD-10-CM | POA: Diagnosis present

## 2012-04-21 DIAGNOSIS — E782 Mixed hyperlipidemia: Secondary | ICD-10-CM

## 2012-04-21 DIAGNOSIS — I5032 Chronic diastolic (congestive) heart failure: Secondary | ICD-10-CM | POA: Diagnosis present

## 2012-04-21 DIAGNOSIS — Z951 Presence of aortocoronary bypass graft: Secondary | ICD-10-CM

## 2012-04-21 DIAGNOSIS — D630 Anemia in neoplastic disease: Secondary | ICD-10-CM | POA: Diagnosis present

## 2012-04-21 DIAGNOSIS — D638 Anemia in other chronic diseases classified elsewhere: Secondary | ICD-10-CM | POA: Diagnosis present

## 2012-04-21 DIAGNOSIS — Z87891 Personal history of nicotine dependence: Secondary | ICD-10-CM

## 2012-04-21 DIAGNOSIS — N2581 Secondary hyperparathyroidism of renal origin: Secondary | ICD-10-CM | POA: Diagnosis present

## 2012-04-21 LAB — COMPREHENSIVE METABOLIC PANEL
ALT: 12 U/L (ref 0–53)
Albumin: 3.6 g/dL (ref 3.5–5.2)
Alkaline Phosphatase: 128 U/L — ABNORMAL HIGH (ref 39–117)
BUN: 52 mg/dL — ABNORMAL HIGH (ref 6–23)
Chloride: 94 mEq/L — ABNORMAL LOW (ref 96–112)
GFR calc Af Amer: 6 mL/min — ABNORMAL LOW (ref >90)
Glucose, Bld: 91 mg/dL (ref 70–99)
Potassium: 6.5 mEq/L (ref 3.5–5.1)
Sodium: 137 mEq/L (ref 135–145)
Total Bilirubin: 0.4 mg/dL (ref 0.3–1.2)

## 2012-04-21 LAB — DIFFERENTIAL
Lymphs Abs: 1.5 10*3/uL (ref 0.7–4.0)
Monocytes Relative: 5 % (ref 3–12)
Neutro Abs: 2.3 10*3/uL (ref 1.7–7.7)
Neutrophils Relative %: 51 % (ref 43–77)

## 2012-04-21 LAB — CBC
HCT: 36.7 % — ABNORMAL LOW (ref 39.0–52.0)
Hemoglobin: 12.4 g/dL — ABNORMAL LOW (ref 13.0–17.0)
Hemoglobin: 11.4 g/dL — ABNORMAL LOW (ref 13.0–17.0)
MCH: 32.3 pg (ref 26.0–34.0)
MCHC: 31.1 g/dL (ref 30.0–36.0)
MCV: 104 fL — ABNORMAL HIGH (ref 78.0–100.0)
RBC: 3.86 MIL/uL — ABNORMAL LOW (ref 4.22–5.81)
RBC: 3.53 MIL/uL — ABNORMAL LOW (ref 4.22–5.81)
WBC: 4.5 10*3/uL (ref 4.0–10.5)

## 2012-04-21 LAB — CARDIAC PANEL(CRET KIN+CKTOT+MB+TROPI)
CK, MB: 3.2 ng/mL (ref 0.3–4.0)
Relative Index: INVALID (ref 0.0–2.5)
Total CK: 61 U/L (ref 7–232)
Troponin I: <0.3 ng/mL (ref <0.30)

## 2012-04-21 LAB — PRO B NATRIURETIC PEPTIDE: Pro B Natriuretic peptide (BNP): 25486 pg/mL — ABNORMAL HIGH (ref 0–450)

## 2012-04-21 LAB — CREATININE, SERUM: Creatinine, Ser: 8.34 mg/dL — ABNORMAL HIGH (ref 0.50–1.35)

## 2012-04-21 LAB — HEPATITIS B SURFACE ANTIGEN: Hepatitis B Surface Ag: NEGATIVE

## 2012-04-21 MED ORDER — ONDANSETRON HCL 4 MG/2ML IJ SOLN: 4.0000 mg | Freq: Four times a day (QID) | INTRAMUSCULAR | Status: DC | PRN

## 2012-04-21 MED ORDER — SODIUM CHLORIDE 0.9 % IV SOLN: 250.0000 mL | INTRAVENOUS | Status: DC | PRN

## 2012-04-21 MED ORDER — SODIUM CHLORIDE 0.9 % IV SOLN: 100.0000 mL | INTRAVENOUS | Status: DC | PRN

## 2012-04-21 MED ORDER — ACETAMINOPHEN 325 MG PO TABS
ORAL_TABLET | ORAL | Status: AC
  Administered 2012-04-21: 650 mg via ORAL
  Filled 2012-04-21: qty 2

## 2012-04-21 MED ORDER — CALCIUM GLUCONATE 10 % IV SOLN
1.0000 g | Freq: Once | INTRAVENOUS | Status: DC
  Administered 2012-04-21: 1 g via INTRAVENOUS

## 2012-04-21 MED ORDER — ONDANSETRON HCL 4 MG PO TABS: 4.0000 mg | ORAL_TABLET | Freq: Four times a day (QID) | ORAL | Status: DC | PRN

## 2012-04-21 MED ORDER — ACETAMINOPHEN 325 MG PO TABS
650.0000 mg | ORAL_TABLET | Freq: Four times a day (QID) | ORAL | Status: DC | PRN
  Administered 2012-04-21: 650 mg via ORAL

## 2012-04-21 MED ORDER — SEVELAMER HCL 800 MG PO TABS
3200.0000 mg | ORAL_TABLET | Freq: Four times a day (QID) | ORAL | Status: DC
  Filled 2012-04-21 (×5): qty 4

## 2012-04-21 MED ORDER — CINACALCET HCL 30 MG PO TABS
30.0000 mg | ORAL_TABLET | Freq: Every day | ORAL | Status: DC
  Administered 2012-04-21 – 2012-04-22 (×2): 30 mg via ORAL
  Filled 2012-04-21 (×2): qty 1

## 2012-04-21 MED ORDER — ACETAMINOPHEN 650 MG RE SUPP: 650.0000 mg | Freq: Four times a day (QID) | RECTAL | Status: DC | PRN

## 2012-04-21 MED ORDER — GABAPENTIN 100 MG PO CAPS
100.0000 mg | ORAL_CAPSULE | Freq: Two times a day (BID) | ORAL | Status: DC
  Administered 2012-04-21 – 2012-04-22 (×2): 100 mg via ORAL
  Filled 2012-04-21 (×3): qty 1

## 2012-04-21 MED ORDER — ATORVASTATIN CALCIUM 10 MG PO TABS
10.0000 mg | ORAL_TABLET | Freq: Every day | ORAL | Status: DC
  Administered 2012-04-22: 10 mg via ORAL
  Filled 2012-04-21 (×2): qty 1

## 2012-04-21 MED ORDER — SODIUM CHLORIDE 0.9 % IJ SOLN: 3.0000 mL | Freq: Two times a day (BID) | INTRAMUSCULAR | Status: DC

## 2012-04-21 MED ORDER — POLYETHYLENE GLYCOL 3350 17 GM/SCOOP PO POWD
17.0000 g | Freq: Every day | ORAL | Status: DC
  Filled 2012-04-21: qty 255

## 2012-04-21 MED ORDER — HYDROCODONE-ACETAMINOPHEN 5-325 MG PO TABS: 1.0000 | ORAL_TABLET | ORAL | Status: DC | PRN

## 2012-04-21 MED ORDER — HEPARIN SODIUM (PORCINE) 5000 UNIT/ML IJ SOLN
5000.0000 [IU] | Freq: Three times a day (TID) | INTRAMUSCULAR | Status: DC
  Administered 2012-04-21: 5000 [IU] via SUBCUTANEOUS
  Filled 2012-04-21 (×5): qty 1

## 2012-04-21 MED ORDER — INSULIN ASPART 100 UNIT/ML ~~LOC~~ SOLN
10.0000 [IU] | Freq: Once | SUBCUTANEOUS | Status: AC
  Administered 2012-04-21: 10 [IU] via INTRAVENOUS
  Filled 2012-04-21: qty 1

## 2012-04-21 MED ORDER — ALBUTEROL SULFATE (5 MG/ML) 0.5% IN NEBU: 2.5000 mg | INHALATION_SOLUTION | RESPIRATORY_TRACT | Status: DC | PRN

## 2012-04-21 MED ORDER — LIDOCAINE-PRILOCAINE 2.5-2.5 % EX CREA: 1.0000 "application " | TOPICAL_CREAM | CUTANEOUS | Status: DC | PRN

## 2012-04-21 MED ORDER — SODIUM CHLORIDE 0.9 % IV SOLN
INTRAVENOUS | Status: AC
  Filled 2012-04-21: qty 100

## 2012-04-21 MED ORDER — GLUCOSE 40 % PO GEL
1.0000 | Freq: Once | ORAL | Status: AC
  Administered 2012-04-21: 37.5 g via ORAL
  Filled 2012-04-21: qty 1

## 2012-04-21 MED ORDER — SODIUM CHLORIDE 0.9 % IJ SOLN: 3.0000 mL | INTRAMUSCULAR | Status: DC | PRN

## 2012-04-21 MED ORDER — FUROSEMIDE 10 MG/ML IJ SOLN
120.0000 mg | Freq: Once | INTRAMUSCULAR | Status: DC
  Filled 2012-04-21: qty 12

## 2012-04-21 MED ORDER — ALTEPLASE 2 MG IJ SOLR: 2.0000 mg | Freq: Once | INTRAMUSCULAR | Status: AC | PRN

## 2012-04-21 MED ORDER — SODIUM CHLORIDE 0.9 % IJ SOLN
3.0000 mL | Freq: Two times a day (BID) | INTRAMUSCULAR | Status: DC
  Administered 2012-04-22: 3 mL via INTRAVENOUS

## 2012-04-21 MED ORDER — DOCUSATE SODIUM 100 MG PO CAPS
100.0000 mg | ORAL_CAPSULE | Freq: Two times a day (BID) | ORAL | Status: DC
  Administered 2012-04-21 – 2012-04-22 (×2): 100 mg via ORAL
  Filled 2012-04-21 (×3): qty 1

## 2012-04-21 MED ORDER — ALLOPURINOL 100 MG PO TABS
200.0000 mg | ORAL_TABLET | Freq: Every day | ORAL | Status: DC
  Administered 2012-04-21 – 2012-04-22 (×2): 200 mg via ORAL
  Filled 2012-04-21 (×2): qty 2

## 2012-04-21 MED ORDER — CALCIUM GLUCONATE 10 % IV SOLN
INTRAVENOUS | Status: AC
  Filled 2012-04-21: qty 10

## 2012-04-21 MED ORDER — ASPIRIN EC 81 MG PO TBEC
81.0000 mg | DELAYED_RELEASE_TABLET | Freq: Every day | ORAL | Status: DC
  Administered 2012-04-21 – 2012-04-22 (×2): 81 mg via ORAL
  Filled 2012-04-21 (×2): qty 1

## 2012-04-21 MED ORDER — HEPARIN SODIUM (PORCINE) 1000 UNIT/ML DIALYSIS
3000.0000 [IU] | INTRAMUSCULAR | Status: DC | PRN
  Administered 2012-04-21: 3000 [IU] via INTRAVENOUS_CENTRAL

## 2012-04-21 MED ORDER — LIDOCAINE HCL (PF) 1 % IJ SOLN: 5.0000 mL | INTRAMUSCULAR | Status: DC | PRN

## 2012-04-21 MED ORDER — PENTAFLUOROPROP-TETRAFLUOROETH EX AERO: 1.0000 "application " | INHALATION_SPRAY | CUTANEOUS | Status: DC | PRN

## 2012-04-21 MED ORDER — HEPARIN SODIUM (PORCINE) 1000 UNIT/ML DIALYSIS: 1000.0000 [IU] | INTRAMUSCULAR | Status: DC | PRN

## 2012-04-21 MED ORDER — NEPRO/CARBSTEADY PO LIQD: 237.0000 mL | ORAL | Status: DC | PRN

## 2012-04-21 MED ORDER — PANTOPRAZOLE SODIUM 40 MG PO TBEC
40.0000 mg | DELAYED_RELEASE_TABLET | Freq: Every day | ORAL | Status: DC
  Administered 2012-04-21: 40 mg via ORAL
  Filled 2012-04-21: qty 1

## 2012-04-21 NOTE — ED Provider Notes (Signed)
History     CSN: [id number]  Arrival date & time 04/21/12  1520   First MD Initiated Contact with Patient 04/21/12 1542      Chief Complaint  Patient presents with  . Shortness of Breath     HPI  The patient p/w dyspnea.  He notes that since HD 2d ago he was initially well, but today, soon after awakening, he developed DOE.  No pain throughout the day, and minimal dyspnea at rest.  Sx have become more incapacitating in spite of rest.  He has taken all medications as directed.  He states that this episode is very similar to prior events, including one that required admission one month ago. No f/c, cp, n/v/d, dysuria (he is oligouric) Past Medical History  Diagnosis Date  . Secondary hyperparathyroidism   . Peripheral vascular disease   . Hyperlipidemia   . Duodenal ulcer     remote  . Gout STABLE  . ESRD (end stage renal disease) NEPHROLOGIST-   DR [name]    Hemodialysis; Hx of bleeding from aneurysms on AVF  . Arteriosclerotic cardiovascular disease (ASCVD) 1998    CABG-1998; h/o CHF  . Cerebrovascular disease REMOTE --  PER CHART--    NO RESIDUAL  . Neuropathy of foot COLD FEET  . Degenerative joint disease   . Gastroesophageal reflux disease   . History of bladder cancer   . Impaired hearing BILATERAL     ONLY WEARS LEFT HEARING AID  . Blood transfusion   . Anemia   . Renal cell carcinoma 2001    s/p right nephrectomy-2001; subsequent ESRD  . Hypertension   . CHF (congestive heart failure)   . Renal insufficiency   . Coronary artery disease     Past Surgical History  Procedure Date  . Right nephrectomy 2001    Renal cell carcinoma  . Colonoscopy 01/24/2011    prominent vascular pattern, suboptimal prep but doable  . Esophagogastroduodenoscopy 01/24/2011    mild erosive reflux esophagitis, bulbar/antral erosions, bx from antrum benign  . Av fistula repair 2008    revision of anastomosis of Right AVF  . Arteriovenous graft placement 2006    Thrombectomy and  interposition jump graft revision to higher axillary vein of LUA AVG  . Coronary artery bypass graft 1998    X4 VESSELS  . Multiple cysto/ resection tumor's with bx's LAST ONE 05-09-2011  . Multiple surg's / interventions for avgg/ fistula right upper arm LAST REVISION 06-29-2011    (JUNE 2010 STENT CEPHALIC VEIN/ 03-23-2011 DILATATION ANGIOPLASTY)  . Left ptc left renal artery   . Cardiac catheterization 2004  . Cataract extraction w/ intraocular lens  implant, bilateral   . Cystoscopy w/ retrogrades 12/28/2011    Procedure: CYSTOSCOPY WITH RETROGRADE PYELOGRAM;  Surgeon: [name], MD;  Location: [facility];  Service: Urology;  Laterality: Left;  C-ARM   . Transurethral resection of bladder tumor 12/28/2011    Procedure: TRANSURETHRAL RESECTION OF BLADDER TUMOR (TURBT);  Surgeon: [name], MD;  Location: [facility];  Service: Urology;  Laterality: N/A;  . Av fistula placement 02/29/2012    Procedure: INSERTION OF ARTERIOVENOUS (AV) GORE-TEX GRAFT THIGH;  Surgeon: [name], MD;  Location: [facility] OR;  Service: Vascular;  Laterality: Right;  Insertion right femoral arteriovenous gortex graft  . Ligation of right braciocephalic av fistula 04-04-2012    Family History  Problem Relation Age of Onset  . Colon cancer Neg Hx   . Liver  disease Neg Hx   . Anesthesia problems Neg Hx   . Heart disease Father     History  Substance Use Topics  . Smoking status: Former Smoker -- 1.0 packs/day for 25 years    Types: Cigarettes    Quit date: 02/26/1984  . Smokeless tobacco: Never Used   Comment: quit 1985  . Alcohol Use: No      Review of Systems  Constitutional:       Per HPI, otherwise negative  HENT:       Per HPI, otherwise negative  Eyes: Negative.   Respiratory:       Per HPI, otherwise negative  Cardiovascular:       Per HPI, otherwise negative  Gastrointestinal: Negative for vomiting.  Genitourinary: Negative.   Musculoskeletal:        Per HPI, otherwise negative  Skin: Negative.   Neurological: Negative for syncope.    Allergies  Review of patient's allergies indicates no known allergies.  Home Medications   Current Outpatient Rx  Name Route Sig Dispense Refill  . ACETAMINOPHEN 500 MG PO TABS Oral Take 1,000 mg by mouth every 6 (six) hours as needed. For pain/headache    . ALLOPURINOL 100 MG PO TABS Oral Take 200 mg by mouth daily.     . ASPIRIN EC 81 MG PO TBEC Oral Take 81 mg by mouth daily.    . ATORVASTATIN CALCIUM 10 MG PO TABS Oral Take 10 mg by mouth daily.     Marland Kitchen DIALYVITE 800/ZINC PO Oral Take 1 tablet by mouth daily.     Marland Kitchen CALCIUM CARBONATE ANTACID 500 MG PO CHEW Oral Chew 1 tablet by mouth daily as needed. For upset stomach    . CINACALCET HCL 30 MG PO TABS Oral Take 30 mg by mouth daily.     . COLCHICINE 0.6 MG PO TABS Oral Take 0.6 mg by mouth 2 (two) times daily as needed. for gout    . DOCUSATE SODIUM 100 MG PO CAPS Oral Take 100 mg by mouth 2 (two) times daily.    Marland Kitchen GABAPENTIN 100 MG PO CAPS Oral Take 100 mg by mouth 2 (two) times daily.    Marland Kitchen OMEPRAZOLE 40 MG PO CPDR Oral Take 40 mg by mouth 2 (two) times daily. For reflux    . OVER THE COUNTER MEDICATION Topical Apply 1 application topically daily as needed. For dry feet  otc healing balm    . POLYETHYLENE GLYCOL 3350 PO POWD Oral Take 17 g by mouth daily.     Marland Kitchen SEVELAMER HCL 800 MG PO TABS Oral Take 1,600-3,200 mg by mouth 4 (four) times daily. Patient takes 4 tablets with meals and 2 tablets with snacks      BP 141/60  Pulse 79  Temp(Src) 97.7 F (36.5 C) (Oral)  Resp 20  SpO2 95%  Physical Exam  Nursing note and vitals reviewed. Constitutional: He is oriented to person, place, and time. He appears well-developed. No distress.  HENT:  Head: Normocephalic and atraumatic.  Eyes: Conjunctivae and EOM are normal.  Cardiovascular: Normal rate and regular rhythm.   Murmur heard. Pulmonary/Chest: Accessory muscle usage present. No stridor.  Tachypnea noted. No respiratory distress. He has decreased breath sounds. He has no wheezes. He has no rhonchi. He has no rales.  Abdominal: He exhibits no distension.  Musculoskeletal: He exhibits no edema.  Neurological: He is alert and oriented to person, place, and time.  Skin: Skin is warm and dry.  Psychiatric: He  has a normal mood and affect.    ED Course  Procedures (including critical care time)   Labs Reviewed  CBC  DIFFERENTIAL  COMPREHENSIVE METABOLIC PANEL  PRO B NATRIURETIC PEPTIDE  TROPONIN I   No results found.   No diagnosis found.  Cardiac: 81sr, normal  Pulse ox 99% ra- normal   Date: 04/21/2012  Rate: 78  Rhythm: normal sinus rhythm  QRS Axis: normal  Intervals: normal  ST/T Wave abnormalities: normal  Conduction Disutrbances:none  Narrative Interpretation:   Old EKG Reviewed: changes noted    MDM  This 78 year old male with end-stage renal disease, congestive heart failure now presents following the development of dyspnea.  Notably, the patient complains of an incapacitating degree of breathlessness with minimal amounts of exertion.  On exam he is in no distress, but he is tachypneic, using accessory muscles.  The patient's initial presentation is concerning for fluid status versus infectious versus cardiogenic etiologies.  The patient's evaluation demonstrates hyperkalemia of 6.5, and a BNP of 25,000.  A review of the patient's ECG did not demonstrate acute T wave changes, but given his collateral abnormalities he was provided calcium, insulin, glucose.  He also received Lasix, the patient is oligouric.  Given the patient's notable abnormalities, he was admitted for consideration of emergent dialysis and monitoring.  I discussed all findings with the patient, and his daughter.  CRITICAL CARE Performed by: Gerhard Munch   Total critical care time: 35  Critical care time was exclusive of separately billable procedures and treating other  patients.  Critical care was necessary to treat or prevent imminent or life-threatening deterioration.  Critical care was time spent personally by me on the following activities: development of treatment plan with patient and/or surrogate as well as nursing, discussions with consultants, evaluation of patient's response to treatment, examination of patient, obtaining history from patient or surrogate, ordering and performing treatments and interventions, ordering and review of laboratory studies, ordering and review of radiographic studies, pulse oximetry and re-evaluation of patient's condition.      Gerhard Munch, MD 04/21/12 2109

## 2012-04-21 NOTE — H&P (Signed)
[facility] History and Physical  Andrew [name] ZOX:[id number] DOB: 1926-07-31 DOA: 04/21/2012  Referring physician: Dr. Jeraldine Loots PCP: [name]., MD, MD   Chief Complaint: Exertional dyspnea  HPI:  Patient is a 78 year old Caucasian gentleman with a past medical history of end-stage renal disease on hemodialysis, congestive heart failure with diastolic dysfunction, history of coronary artery disease status post CABG and PTCA who presents to the hospital with the above-noted complaints. Patient claims that for the past few days he has been more short of breath than at baseline. His shortness of breath is purely exertional. Even walking around 10-15 feet makes him short of breath. He denies any chest pain or palpitations. He denies any fever. He denies any cough. He denies any headache or neck pain. He was evaluated in the emergency room and was found to have pulmonary edema on chest x-ray, potassium was found to be 6.5 and he was referred to the hospitalist service for admission.  Review of Systems:  Neurological-no headache no blurry vision Cardiovascular-no chest pain Pulmonary-no cough or hemoptysis GI-no nausea or vomiting. No abdominal pain GU-no hematuria Skin-no rash Musculoskeletal-low back pain  Past Medical History  Diagnosis Date  . Secondary hyperparathyroidism   . Peripheral vascular disease   . Hyperlipidemia   . Duodenal ulcer     remote  . Gout STABLE  . ESRD (end stage renal disease) NEPHROLOGIST-   DR [name]    Hemodialysis; Hx of bleeding from aneurysms on AVF  . Arteriosclerotic cardiovascular disease (ASCVD) 1998    CABG-1998; h/o CHF  . Cerebrovascular disease REMOTE --  PER CHART--    NO RESIDUAL  . Neuropathy of foot COLD FEET  . Degenerative joint disease   . Gastroesophageal reflux disease   . History of bladder cancer   . Impaired hearing BILATERAL     ONLY WEARS LEFT HEARING AID  . Blood transfusion   . Anemia   . Renal cell carcinoma  2001    s/p right nephrectomy-2001; subsequent ESRD  . Hypertension   . CHF (congestive heart failure)   . Renal insufficiency   . Coronary artery disease    Past Surgical History  Procedure Date  . Right nephrectomy 2001    Renal cell carcinoma  . Colonoscopy 01/24/2011    prominent vascular pattern, suboptimal prep but doable  . Esophagogastroduodenoscopy 01/24/2011    mild erosive reflux esophagitis, bulbar/antral erosions, bx from antrum benign  . Av fistula repair 2008    revision of anastomosis of Right AVF  . Arteriovenous graft placement 2006    Thrombectomy and interposition jump graft revision to higher axillary vein of LUA AVG  . Coronary artery bypass graft 1998    X4 VESSELS  . Multiple cysto/ resection tumor's with bx's LAST ONE 05-09-2011  . Multiple surg's / interventions for avgg/ fistula right upper arm LAST REVISION 06-29-2011    (JUNE 2010 STENT CEPHALIC VEIN/ 03-23-2011 DILATATION ANGIOPLASTY)  . Left ptc left renal artery   . Cardiac catheterization 2004  . Cataract extraction w/ intraocular lens  implant, bilateral   . Cystoscopy w/ retrogrades 12/28/2011    Procedure: CYSTOSCOPY WITH RETROGRADE PYELOGRAM;  Surgeon: [name], MD;  Location: [facility];  Service: Urology;  Laterality: Left;  C-ARM   . Transurethral resection of bladder tumor 12/28/2011    Procedure: TRANSURETHRAL RESECTION OF BLADDER TUMOR (TURBT);  Surgeon: [name], MD;  Location: [facility];  Service: Urology;  Laterality: N/A;  .  Av fistula placement 02/29/2012    Procedure: INSERTION OF ARTERIOVENOUS (AV) GORE-TEX GRAFT THIGH;  Surgeon: [name], MD;  Location: [facility] OR;  Service: Vascular;  Laterality: Right;  Insertion right femoral arteriovenous gortex graft  . Ligation of right braciocephalic av fistula 04-04-2012   Social History:  reports that he quit smoking about 28 years ago. His smoking use included Cigarettes. He has a 25 pack-year smoking  history. He has never used smokeless tobacco. He reports that he does not drink alcohol or use illicit drugs.  No Known Allergies  Family History  Problem Relation Age of Onset  . Colon cancer Neg Hx   . Liver disease Neg Hx   . Anesthesia problems Neg Hx   . Heart disease Father     Prior to Admission medications   Medication Sig Start Date End Date Taking? Authorizing Provider  acetaminophen (TYLENOL) 500 MG tablet Take 1,000 mg by mouth every 6 (six) hours as needed. For pain/headache   Yes Historical Provider, MD  allopurinol (ZYLOPRIM) 100 MG tablet Take 200 mg by mouth daily.  09/19/11  Yes Rhetta Mura, MD  aspirin EC 81 MG tablet Take 81 mg by mouth daily.   Yes Historical Provider, MD  atorvastatin (LIPITOR) 10 MG tablet Take 10 mg by mouth daily.    Yes Historical Provider, MD  B Complex-C-Zn-Folic Acid (DIALYVITE 800/ZINC PO) Take 1 tablet by mouth daily.    Yes Historical Provider, MD  calcium carbonate (TUMS - DOSED IN MG ELEMENTAL CALCIUM) 500 MG chewable tablet Chew 1 tablet by mouth daily as needed. For upset stomach   Yes Historical Provider, MD  cinacalcet (SENSIPAR) 30 MG tablet Take 30 mg by mouth daily.    Yes Historical Provider, MD  colchicine (COLCRYS) 0.6 MG tablet Take 0.6 mg by mouth 2 (two) times daily as needed. for gout   Yes Historical Provider, MD  docusate sodium (COLACE) 100 MG capsule Take 100 mg by mouth 2 (two) times daily.   Yes Historical Provider, MD  gabapentin (NEURONTIN) 100 MG capsule Take 100 mg by mouth 2 (two) times daily. 01/09/12 01/08/13 Yes Kathlen Brunswick, MD  omeprazole (PRILOSEC) 40 MG capsule Take 40 mg by mouth 2 (two) times daily. For reflux   Yes Historical Provider, MD  OVER THE COUNTER MEDICATION Apply 1 application topically daily as needed. For dry feet  otc healing balm   Yes Historical Provider, MD  polyethylene glycol powder (MIRALAX) powder Take 17 g by mouth daily.    Yes Historical Provider, MD  sevelamer (RENAGEL)  800 MG tablet Take 1,600-3,200 mg by mouth 4 (four) times daily. Patient takes 4 tablets with meals and 2 tablets with snacks   Yes Historical Provider, MD   Physical Exam: Filed Vitals:   04/21/12 1533 04/21/12 1634 04/21/12 1815  BP: 141/60 154/65 162/71  Pulse: 79 78 82  Temp: 97.7 F (36.5 C)  98.7 F (37.1 C)  TempSrc: Oral  Oral  Resp: 20 20 18   SpO2: 95% 96% 97%     General:  Awake alert, in slight respiratory distress after exertion, but at rest lying comfortably. Speech is clear.  Eyes: Within normal limits  ENT: Within normal limits  Neck: Supple  Cardiovascular: Heart sounds regular with a systolic murmur heard in the aortic area  Respiratory: Good air entry with bibasilar  Abdomen: Abdomen is soft nontender and nondistended  Skin: No rash  Musculoskeletal: Within normal limits  Psychiatric: Within normal limits  Neurologic:  Nonfocal  Labs on Admission:  Basic Metabolic Panel:  Lab 04/21/12 4098  NA 137  K 6.5*  CL 94*  CO2 32  GLUCOSE 91  BUN 52*  CREATININE 8.23*  CALCIUM 8.5  MG --  PHOS --   Liver Function Tests:  Lab 04/21/12 1620  AST 20  ALT 12  ALKPHOS 128*  BILITOT 0.4  PROT 6.6  ALBUMIN 3.6   No results found for this basename: LIPASE:5,AMYLASE:5 in the last 168 hours No results found for this basename: AMMONIA:5 in the last 168 hours CBC:  Lab 04/21/12 1620  WBC 4.5  NEUTROABS 2.3  HGB 12.4*  HCT 40.2  MCV 104.1*  PLT 129*   Cardiac Enzymes:  Lab 04/21/12 1620  CKTOTAL --  CKMB --  CKMBINDEX --  TROPONINI <0.30   BNP: No components found with this basename: POCBNP:5 CBG: No results found for this basename: GLUCAP:5 in the last 168 hours  Radiological Exams on Admission: Dg Chest 2 View  04/21/2012  *RADIOLOGY REPORT*  Clinical Data: Shortness of breath.  Chest discomfort.  CHEST - 2 VIEW  Comparison: 04/04/2012  Findings: There is new mild pulmonary vascular congestion with slight accentuation of the  interstitial markings and tiny bilateral pleural effusions.  I suspect that the patient has mild pulmonary edema.  Evidence of prior CABG.  Vascular stent in the right axilla.  IMPRESSION: Pulmonary vascular congestion with slight interstitial edema and tiny effusions.  Original Report Authenticated By: Gwynn Burly, M.D.    EKG: Independently reviewed.   Assessment/Plan Principal Problem:  *Acute pulmonary edema -In the setting of end-stage renal disease and congestive heart failure, acute pulmonary edema is likely a combination of both incisional disease and CHF. Patient claims to be compliance with diet and medications. -Nephrology has been informed the patient's admission, they will arrange for dialysis later today.  Active Problems:  Hyperkalemia -ED physician has already ordered for intravenous NovoLog and dextrose. -Nephrology has already been informed of the patient's admission -Recheck BMET after dialysis   ANEMIA -Secondary to chronic disease -Would defer management to nephrology   Hypertension -Currently with moderate control -Does not appear to be on any antihypertensive medications as an outpatient -Monitor and start appropriate antihypertensive as deemed necessary   Hyperlipidemia -Continue with statin   ESRD (end stage renal disease) -Per nephrology   Arteriosclerotic cardiovascular disease (ASCVD) -Initial set of cardiac enzymes negative, continue with aspirin and statin -We'll cycle cardiac enzymes given presentation of acute pulmonary edema  Code Status: Full code Family Communication: Diane Marney-daughter at bedside Disposition Plan: Likely home after resolution of pulmonary edema, perhaps on 5/27 2013  Ricke Kimoto, MD  [facility] Pager [phone]  If 7PM-7AM, please contact night-coverage [email or web address] Password [email or web address] 04/21/2012, 6:19 PM

## 2012-04-21 NOTE — ED Notes (Signed)
Pt presents to department for evaluation of SOB. Onset today. Pt states he is HD patient, receives treatments on mon, wed, and fri. Fistula to R thigh. States that SOB becomes worse with walking and activities. Respirations unlabored. Lung sounds clear and equal bilaterally. Denies chest pain. Pt conscious alert and oriented x4. Pt and wife concerned about fluid being taken off during dialysis.

## 2012-04-21 NOTE — ED Notes (Signed)
Floor unable to take report at the time. Nurse to call back. 

## 2012-04-21 NOTE — Progress Notes (Signed)
While on unit, was asked to look at heart rhythm of pt post dialysis.  Pt appears to have intermittent afib with PVC's at a rate of 85-95 bpm.  Pt states that he feels fine, better than before dialysis, no palpitations or dizziness.  Advised RN to call MD to make aware.  Ezra Sites RN, [facility]

## 2012-04-21 NOTE — Progress Notes (Signed)
Patient received to room 6740. Oriented to room, call light system and falls policy. Call bell placed within reach.

## 2012-04-21 NOTE — ED Notes (Signed)
C/o sob since this morning.  Last dialysis on Friday.  Speaking in complete sentences.  States sob worse with exertion.

## 2012-04-21 NOTE — Consult Note (Signed)
Andrew [name] 04/21/2012 Andrew [name] D Requesting Physician:  Dr. Jerral Ralph  Reason for Consult:  Dialysis patient with SOB and high K+ HPI: The patient is a 78 y.o. year-old WM with hx of PVD, ESRD, CAD with prior CABG who presented to the ED today with complaints of dyspnea on exertion. In ED CXR showed mild to moderate edema and K+ returned at 6.5. He is not in distress. No fever, chills, prod cough or chest pain. No abd pain. + mild diarrhea off and on. He thinks he has lot weight, appetite has been poor.  Had a colonscopy in last 6 months.    Past Medical History:  Past Medical History  Diagnosis Date  . Secondary hyperparathyroidism   . Peripheral vascular disease   . Hyperlipidemia   . Duodenal ulcer     remote  . Gout STABLE  . ESRD (end stage renal disease) NEPHROLOGIST-   DR [name]    Hemodialysis; Hx of bleeding from aneurysms on AVF  . Arteriosclerotic cardiovascular disease (ASCVD) 1998    CABG-1998; h/o CHF  . Cerebrovascular disease REMOTE --  PER CHART--    NO RESIDUAL  . Neuropathy of foot COLD FEET  . Degenerative joint disease   . Gastroesophageal reflux disease   . History of bladder cancer   . Impaired hearing BILATERAL     ONLY WEARS LEFT HEARING AID  . Blood transfusion   . Anemia   . Renal cell carcinoma 2001    s/p right nephrectomy-2001; subsequent ESRD  . Hypertension   . CHF (congestive heart failure)   . Renal insufficiency   . Coronary artery disease     Past Surgical History:  Past Surgical History  Procedure Date  . Right nephrectomy 2001    Renal cell carcinoma  . Colonoscopy 01/24/2011    prominent vascular pattern, suboptimal prep but doable  . Esophagogastroduodenoscopy 01/24/2011    mild erosive reflux esophagitis, bulbar/antral erosions, bx from antrum benign  . Av fistula repair 2008    revision of anastomosis of Right AVF  . Arteriovenous graft placement 2006    Thrombectomy and interposition jump graft revision to higher  axillary vein of LUA AVG  . Coronary artery bypass graft 1998    X4 VESSELS  . Multiple cysto/ resection tumor's with bx's LAST ONE 05-09-2011  . Multiple surg's / interventions for avgg/ fistula right upper arm LAST REVISION 06-29-2011    (JUNE 2010 STENT CEPHALIC VEIN/ 03-23-2011 DILATATION ANGIOPLASTY)  . Left ptc left renal artery   . Cardiac catheterization 2004  . Cataract extraction w/ intraocular lens  implant, bilateral   . Cystoscopy w/ retrogrades 12/28/2011    Procedure: CYSTOSCOPY WITH RETROGRADE PYELOGRAM;  Surgeon: [name], MD;  Location: [facility];  Service: Urology;  Laterality: Left;  C-ARM   . Transurethral resection of bladder tumor 12/28/2011    Procedure: TRANSURETHRAL RESECTION OF BLADDER TUMOR (TURBT);  Surgeon: [name], MD;  Location: [facility];  Service: Urology;  Laterality: N/A;  . Av fistula placement 02/29/2012    Procedure: INSERTION OF ARTERIOVENOUS (AV) GORE-TEX GRAFT THIGH;  Surgeon: [name], MD;  Location: [facility] OR;  Service: Vascular;  Laterality: Right;  Insertion right femoral arteriovenous gortex graft  . Ligation of right braciocephalic av fistula 04-04-2012    Family History:  Family History  Problem Relation Age of Onset  . Colon cancer Neg Hx   . Liver disease Neg Hx   . Anesthesia problems  Neg Hx   . Heart disease Father    Social History:  reports that he quit smoking about 28 years ago. His smoking use included Cigarettes. He has a 25 pack-year smoking history. He has never used smokeless tobacco. He reports that he does not drink alcohol or use illicit drugs.  Allergies: No Known Allergies  Home medications: Prior to Admission medications   Medication Sig Start Date End Date Taking? Authorizing Provider  acetaminophen (TYLENOL) 500 MG tablet Take 1,000 mg by mouth every 6 (six) hours as needed. For pain/headache   Yes Historical Provider, MD  allopurinol (ZYLOPRIM) 100 MG tablet Take 200 mg by  mouth daily.  09/19/11  Yes Rhetta Mura, MD  aspirin EC 81 MG tablet Take 81 mg by mouth daily.   Yes Historical Provider, MD  atorvastatin (LIPITOR) 10 MG tablet Take 10 mg by mouth daily.    Yes Historical Provider, MD  B Complex-C-Zn-Folic Acid (DIALYVITE 800/ZINC PO) Take 1 tablet by mouth daily.    Yes Historical Provider, MD  calcium carbonate (TUMS - DOSED IN MG ELEMENTAL CALCIUM) 500 MG chewable tablet Chew 1 tablet by mouth daily as needed. For upset stomach   Yes Historical Provider, MD  cinacalcet (SENSIPAR) 30 MG tablet Take 30 mg by mouth daily.    Yes Historical Provider, MD  colchicine (COLCRYS) 0.6 MG tablet Take 0.6 mg by mouth 2 (two) times daily as needed. for gout   Yes Historical Provider, MD  docusate sodium (COLACE) 100 MG capsule Take 100 mg by mouth 2 (two) times daily.   Yes Historical Provider, MD  gabapentin (NEURONTIN) 100 MG capsule Take 100 mg by mouth 2 (two) times daily. 01/09/12 01/08/13 Yes Kathlen Brunswick, MD  omeprazole (PRILOSEC) 40 MG capsule Take 40 mg by mouth 2 (two) times daily. For reflux   Yes Historical Provider, MD  OVER THE COUNTER MEDICATION Apply 1 application topically daily as needed. For dry feet  otc healing balm   Yes Historical Provider, MD  polyethylene glycol powder (MIRALAX) powder Take 17 g by mouth daily.    Yes Historical Provider, MD  sevelamer (RENAGEL) 800 MG tablet Take 1,600-3,200 mg by mouth 4 (four) times daily. Patient takes 4 tablets with meals and 2 tablets with snacks   Yes Historical Provider, MD    Inpatient medications:    . allopurinol  200 mg Oral Daily  . aspirin EC  81 mg Oral Daily  . atorvastatin  10 mg Oral Daily  . calcium gluconate      . cinacalcet  30 mg Oral Daily  . dextrose  1 Tube Oral Once  . docusate sodium  100 mg Oral BID  . gabapentin  100 mg Oral BID  . heparin  5,000 Units Subcutaneous Q8H  . insulin aspart  10 Units Intravenous Once  . pantoprazole  40 mg Oral Q1200  . polyethylene  glycol powder  17 g Oral Daily  . sevelamer  3,200 mg Oral QID  . sodium chloride      . sodium chloride  3 mL Intravenous Q12H  . sodium chloride  3 mL Intravenous Q12H  . DISCONTD: calcium gluconate  1 g Intravenous Once  . DISCONTD: furosemide  120 mg Intravenous Once    Review of Systems Gen:  Denies headache, fever, chills, sweats.  No weight loss. HEENT:  No visual change, sore throat, difficulty swallowing. Resp:  SEe above. Cardiac:  See above. GI:   Denies abdominal pain.  No nausea, vomiting, diarrhea.  No constipation. GU:  Denies difficulty or change in voiding.  No change in urine color.     MS:  Denies joint pain or swelling.   Derm:  Denies skin rash or itching.  No chronic skin conditions.  Neuro:   Denies focal weakness, memory problems, hx stroke or TIA.   Psych:  Denies symptoms of depression of anxiety.  No hallucination.    Labs: Basic Metabolic Panel:  Lab 04/21/12 9604  NA 137  K 6.5*  CL 94*  CO2 32  GLUCOSE 91  BUN 52*  CREATININE 8.23*  ALB --  CALCIUM 8.5  PHOS --   Liver Function Tests:  Lab 04/21/12 1620  AST 20  ALT 12  ALKPHOS 128*  BILITOT 0.4  PROT 6.6  ALBUMIN 3.6   No results found for this basename: LIPASE:3,AMYLASE:3 in the last 168 hours No results found for this basename: AMMONIA:3 in the last 168 hours CBC:  Lab 04/21/12 1620  WBC 4.5  NEUTROABS 2.3  HGB 12.4*  HCT 40.2  MCV 104.1*  PLT 129*   PT/INR: @labrcntip (inr:5) Cardiac Enzymes:  Lab 04/21/12 1620  CKTOTAL --  CKMB --  CKMBINDEX --  TROPONINI <0.30   CBG: No results found for this basename: GLUCAP:5 in the last 168 hours  Iron Studies: No results found for this basename: IRON:30,TIBC:30,TRANSFERRIN:30,FERRITIN:30 in the last 168 hours  Xrays/Other Studies: Dg Chest 2 View  04/21/2012  *RADIOLOGY REPORT*  Clinical Data: Shortness of breath.  Chest discomfort.  CHEST - 2 VIEW  Comparison: 04/04/2012  Findings: There is new mild pulmonary vascular  congestion with slight accentuation of the interstitial markings and tiny bilateral pleural effusions.  I suspect that the patient has mild pulmonary edema.  Evidence of prior CABG.  Vascular stent in the right axilla.  IMPRESSION: Pulmonary vascular congestion with slight interstitial edema and tiny effusions.  Original Report Authenticated By: Gwynn Burly, M.D.    Physical Exam:  Blood pressure 162/71, pulse 82, temperature 98.7 F (37.1 C), temperature source Oral, resp. rate 18, SpO2 97.00%.  Gen: alert, no distress Skin: no rash, cyanosis Neck: no JVD, no bruits or LAN Chest: bibasilar crackles, coarse at R base, no wheezing Heart: regular, no rub, no murmur or gallop Abdomen: soft, nontender, no ascites or HSM Ext: 1 + edema L ankle only Neuro: alert, Ox3, no focal deficit Heme/Lymph: no bruising or LAN  Outpatient HD: MWF TTS at [facility] HD. 4 hrs EDW 78 kg. No other info available.    Impression/Plan 1. Dyspnea due to pulm edema - acute HD tonight x 3 hours, remove 3-4 kg.   2. Hyperkalemia - HD tonight 3. ESRD/ MWF at [facility] HD - wants to be discharged tonight, which is OK with me, does not need post HD labs, but i talked with person covering for [facility] and he said that he's not sure about that, that there is not a mechanism in place for discharging patients at night. If he is still here tomorrow he can dialyze here, or can go to his outpt HD if he hasn't missed it.  4. CAD hx CABG - no chest pain 5. Hx gout 6. Hx of right nephrectomy 7. Hx of TUR of bladder tumor   Vinson Moselle  MD [facility] [phone] pgr    [phone] cell 04/21/2012, 7:22 PM

## 2012-04-22 DIAGNOSIS — J810 Acute pulmonary edema: Secondary | ICD-10-CM

## 2012-04-22 DIAGNOSIS — E875 Hyperkalemia: Secondary | ICD-10-CM

## 2012-04-22 DIAGNOSIS — E782 Mixed hyperlipidemia: Secondary | ICD-10-CM

## 2012-04-22 DIAGNOSIS — N186 End stage renal disease: Secondary | ICD-10-CM

## 2012-04-22 LAB — RENAL FUNCTION PANEL
Albumin: 3.3 g/dL — ABNORMAL LOW (ref 3.5–5.2)
BUN: 26 mg/dL — ABNORMAL HIGH (ref 6–23)
Calcium: 8.5 mg/dL (ref 8.4–10.5)
Creatinine, Ser: 5.33 mg/dL — ABNORMAL HIGH (ref 0.50–1.35)
GFR calc non Af Amer: 9 mL/min — ABNORMAL LOW (ref >90)

## 2012-04-22 LAB — CARDIAC PANEL(CRET KIN+CKTOT+MB+TROPI)
CK, MB: 3.3 ng/mL (ref 0.3–4.0)
Total CK: 62 U/L (ref 7–232)

## 2012-04-22 NOTE — Procedures (Signed)
I was present at this dialysis session. I have reviewed the session itself and made appropriate changes.   Vinson Moselle, MD [employer] 04/22/2012, 7:45 AM

## 2012-04-22 NOTE — Progress Notes (Signed)
Discharge instructions reviewed with patient, patient verbalizes understanding.

## 2012-04-22 NOTE — Progress Notes (Signed)
Subjective:  Sitting in chair, no complaints, dyspnea resolved after dialysis last night.  Objective: Vital signs in last 24 hours: Temp:  [97.2 F (36.2 C)-98.7 F (37.1 C)] 97.6 F (36.4 C) (05/27 0433) Pulse Rate:  [50-94] 78  (05/27 0433) Resp:  [14-23] 18  (05/27 0433) BP: (73-162)/(38-71) 124/68 mmHg (05/27 0433) SpO2:  [93 %-97 %] 95 % (05/27 0433) Weight:  [76.5 kg (168 lb 10.4 oz)-79.2 kg (174 lb 9.7 oz)] 76.5 kg (168 lb 10.4 oz) (05/26 2305) Weight change:   Intake/Output from previous day: 05/26 0701 - 05/27 0700 In: -  Out: 3157    EXAM: General appearance:  Alert, in no apparent distress Resp:  CTA bilaterally Cardio:  RRR with Gr I/VI systolic murmur GI:  + BS, soft and nontender Extremities:  No edema Access:  AVG @ right thigh with + bruit  Lab Results:  Basename 04/21/12 2010 04/21/12 1620  WBC 3.9* 4.5  HGB 11.4* 12.4*  HCT 36.7* 40.2  PLT 114* 129*   BMET:  Basename 04/22/12 0302 04/21/12 2010 04/21/12 1620  NA 137 -- 137  K 5.1 -- 6.5*  CL 97 -- 94*  CO2 31 -- 32  GLUCOSE 90 -- 91  BUN 26* -- 52*  CREATININE 5.33* 8.34* --  CALCIUM 8.5 -- 8.5  ALBUMIN 3.3* -- 3.6   No results found for this basename: PTH:2 in the last 72 hours Iron Studies: No results found for this basename: IRON,TIBC,TRANSFERRIN,FERRITIN in the last 72 hours  Assessment/Plan:  1. Dyspnea due to pulm edema - acute, improved s/p HD x 3 hours with net UF 3157 ml (post-HD wt 76.5).  2. Hyperkalemia - K 6.5 pre-HD last night, 5.1 today.  3. ESRD/ MWF at [facility] HD - will be dc'd this AM to go to regularly scheduled HD in [place] at noon. 4. CAD hx CABG - no chest pain 5. Hx gout 6. Hx of right nephrectomy 7. Hx of TUR of bladder tumor  LOS: 1 day   Brittanyann Wittner 04/22/2012,9:41 AM

## 2012-04-22 NOTE — Progress Notes (Signed)
I have seen and examined this patient and agree with the plan of care.  Shaquoia Miers L 04/22/2012, 3:57 PM

## 2012-04-22 NOTE — Discharge Summary (Signed)
DISCHARGE SUMMARY  Andrew [name]  Andrew#: [id number]  DOB:08-Jun-1926  Date of Admission: 04/21/2012 Date of Discharge: 04/22/2012  Attending Physician:Andrew [name]  Patient's WUJ:WJXBJ,YNWGNFAO J., MD, MD  Consults:Treatment Team:  Andrew Krabbe, MD  Discharge Diagnoses: Present on Admission:  .Acute pulmonary edema .Hypertension .Hyperlipidemia .Hyperkalemia .ESRD (end stage renal disease) .ANEMIA .Arteriosclerotic cardiovascular disease (ASCVD)  Hospital Course: Andrew [name] was admitted yesterday with acute shortness of breath thought to be due to fluid overload. He was also hyperkalemic with potassium of 6.5. Patient denies any fever or cough or chest pain, and suspicion is that he just got fulid overloaded. He says he has been trying to lose weight, and there may have been some imbalance because of that. He was dialyzed last night, and feels better. He will be dialyzed at his HD unit today.    Medication List  As of 04/22/2012  9:37 AM   TAKE these medications         acetaminophen 500 MG tablet   Commonly known as: TYLENOL   Take 1,000 mg by mouth every 6 (six) hours as needed. For pain/headache      allopurinol 100 MG tablet   Commonly known as: ZYLOPRIM   Take 200 mg by mouth daily.      aspirin EC 81 MG tablet   Take 81 mg by mouth daily.      atorvastatin 10 MG tablet   Commonly known as: LIPITOR   Take 10 mg by mouth daily.      calcium carbonate 500 MG chewable tablet   Commonly known as: TUMS - dosed in mg elemental calcium   Chew 1 tablet by mouth daily as needed. For upset stomach      cinacalcet 30 MG tablet   Commonly known as: SENSIPAR   Take 30 mg by mouth daily.      COLCRYS 0.6 MG tablet   Generic drug: colchicine   Take 0.6 mg by mouth 2 (two) times daily as needed. for gout      DIALYVITE 800/ZINC PO   Take 1 tablet by mouth daily.      docusate sodium 100 MG capsule   Commonly known as: COLACE   Take 100 mg by mouth 2 (two) times  daily.      gabapentin 100 MG capsule   Commonly known as: NEURONTIN   Take 100 mg by mouth 2 (two) times daily.      MIRALAX powder   Generic drug: polyethylene glycol powder   Take 17 g by mouth daily.      omeprazole 40 MG capsule   Commonly known as: PRILOSEC   Take 40 mg by mouth 2 (two) times daily. For reflux      OVER THE COUNTER MEDICATION   Apply 1 application topically daily as needed. For dry feet  otc healing balm      sevelamer 800 MG tablet   Commonly known as: RENAGEL   Take 1,600-3,200 mg by mouth 4 (four) times daily. Patient takes 4 tablets with meals and 2 tablets with snacks             Day of Discharge BP 124/68  Pulse 78  Temp(Src) 97.6 F (36.4 C) (Oral)  Resp 18  Wt 76.5 kg (168 lb 10.4 oz)  SpO2 95%  Physical Exam: Lungs clear.  Results for orders placed during the hospital encounter of 04/21/12 (from the past 24 hour(s))  CBC     Status: Abnormal   Collection Time  04/21/12  4:20 PM      Component Value Range   WBC 4.5  4.0 - 10.5 (K/uL)   RBC 3.86 (*) 4.22 - 5.81 (MIL/uL)   Hemoglobin 12.4 (*) 13.0 - 17.0 (g/dL)   HCT 16.1  09.6 - 04.5 (%)   MCV 104.1 (*) 78.0 - 100.0 (fL)   MCH 32.1  26.0 - 34.0 (pg)   MCHC 30.8  30.0 - 36.0 (g/dL)   RDW 40.9 (*) 81.1 - 15.5 (%)   Platelets 129 (*) 150 - 400 (K/uL)  DIFFERENTIAL     Status: Abnormal   Collection Time   04/21/12  4:20 PM      Component Value Range   Neutrophils Relative 51  43 - 77 (%)   Neutro Abs 2.3  1.7 - 7.7 (K/uL)   Lymphocytes Relative 33  12 - 46 (%)   Lymphs Abs 1.5  0.7 - 4.0 (K/uL)   Monocytes Relative 5  3 - 12 (%)   Monocytes Absolute 0.2  0.1 - 1.0 (K/uL)   Eosinophils Relative 10 (*) 0 - 5 (%)   Eosinophils Absolute 0.5  0.0 - 0.7 (K/uL)   Basophils Relative 1  0 - 1 (%)   Basophils Absolute 0.0  0.0 - 0.1 (K/uL)  COMPREHENSIVE METABOLIC PANEL     Status: Abnormal   Collection Time   04/21/12  4:20 PM      Component Value Range   Sodium 137  135 - 145  (mEq/L)   Potassium 6.5 (*) 3.5 - 5.1 (mEq/L)   Chloride 94 (*) 96 - 112 (mEq/L)   CO2 32  19 - 32 (mEq/L)   Glucose, Bld 91  70 - 99 (mg/dL)   BUN 52 (*) 6 - 23 (mg/dL)   Creatinine, Ser 9.14 (*) 0.50 - 1.35 (mg/dL)   Calcium 8.5  8.4 - 78.2 (mg/dL)   Total Protein 6.6  6.0 - 8.3 (g/dL)   Albumin 3.6  3.5 - 5.2 (g/dL)   AST 20  0 - 37 (U/L)   ALT 12  0 - 53 (U/L)   Alkaline Phosphatase 128 (*) 39 - 117 (U/L)   Total Bilirubin 0.4  0.3 - 1.2 (mg/dL)   GFR calc non Af Amer 5 (*) >90 (mL/min)   GFR calc Af Amer 6 (*) >90 (mL/min)  PRO B NATRIURETIC PEPTIDE     Status: Abnormal   Collection Time   04/21/12  4:20 PM      Component Value Range   Pro B Natriuretic peptide (BNP) 25486.0 (*) 0 - 450 (pg/mL)  TROPONIN I     Status: Normal   Collection Time   04/21/12  4:20 PM      Component Value Range   Troponin I <0.30  <0.30 (ng/mL)  CBC     Status: Abnormal   Collection Time   04/21/12  8:10 PM      Component Value Range   WBC 3.9 (*) 4.0 - 10.5 (K/uL)   RBC 3.53 (*) 4.22 - 5.81 (MIL/uL)   Hemoglobin 11.4 (*) 13.0 - 17.0 (g/dL)   HCT 95.6 (*) 21.3 - 52.0 (%)   MCV 104.0 (*) 78.0 - 100.0 (fL)   MCH 32.3  26.0 - 34.0 (pg)   MCHC 31.1  30.0 - 36.0 (g/dL)   RDW 08.6 (*) 57.8 - 15.5 (%)   Platelets 114 (*) 150 - 400 (K/uL)  CREATININE, SERUM     Status: Abnormal   Collection Time   04/21/12  8:10 PM      Component Value Range   Creatinine, Ser 8.34 (*) 0.50 - 1.35 (mg/dL)   GFR calc non Af Amer 5 (*) >90 (mL/min)   GFR calc Af Amer 6 (*) >90 (mL/min)  CARDIAC PANEL(CRET KIN+CKTOT+MB+TROPI)     Status: Normal   Collection Time   04/21/12  8:10 PM      Component Value Range   Total CK 61  7 - 232 (U/L)   CK, MB 3.2  0.3 - 4.0 (ng/mL)   Troponin I <0.30  <0.30 (ng/mL)   Relative Index RELATIVE INDEX IS INVALID  0.0 - 2.5   HEPATITIS B SURFACE ANTIGEN     Status: Normal   Collection Time   04/21/12  8:10 PM      Component Value Range   Hepatitis B Surface Ag NEGATIVE  NEGATIVE     CARDIAC PANEL(CRET KIN+CKTOT+MB+TROPI)     Status: Normal   Collection Time   04/22/12  3:02 AM      Component Value Range   Total CK 62  7 - 232 (U/L)   CK, MB 3.3  0.3 - 4.0 (ng/mL)   Troponin I <0.30  <0.30 (ng/mL)   Relative Index RELATIVE INDEX IS INVALID  0.0 - 2.5   RENAL FUNCTION PANEL     Status: Abnormal   Collection Time   04/22/12  3:02 AM      Component Value Range   Sodium 137  135 - 145 (mEq/L)   Potassium 5.1  3.5 - 5.1 (mEq/L)   Chloride 97  96 - 112 (mEq/L)   CO2 31  19 - 32 (mEq/L)   Glucose, Bld 90  70 - 99 (mg/dL)   BUN 26 (*) 6 - 23 (mg/dL)   Creatinine, Ser 1.61 (*) 0.50 - 1.35 (mg/dL)   Calcium 8.5  8.4 - 09.6 (mg/dL)   Phosphorus 3.8  2.3 - 4.6 (mg/dL)   Albumin 3.3 (*) 3.5 - 5.2 (g/dL)   GFR calc non Af Amer 9 (*) >90 (mL/min)   GFR calc Af Amer 10 (*) >90 (mL/min)    Disposition: home today.   Follow-up Appts: Discharge Orders    Future Orders Please Complete By Expires   Diet - low sodium heart healthy      Increase activity slowly           Tests Needing Follow-up: Renal panel.  Time spent in discharge (includes decision making & examination of pt): 22 minutes  Signed: Kaesyn Johnston 04/22/2012, 9:37 AM

## 2012-04-24 NOTE — Progress Notes (Signed)
Utilization review completed.  

## 2012-04-25 ENCOUNTER — Encounter: Payer: Self-pay | Admitting: Adult Health

## 2012-04-25 ENCOUNTER — Ambulatory Visit (INDEPENDENT_AMBULATORY_CARE_PROVIDER_SITE_OTHER): Payer: Medicare Other | Admitting: Adult Health

## 2012-04-25 VITALS — BP 118/56 | HR 77 | Resp 16 | Ht 68.0 in | Wt 173.0 lb

## 2012-04-25 DIAGNOSIS — I7090 Unspecified atherosclerosis: Secondary | ICD-10-CM

## 2012-04-25 DIAGNOSIS — I2510 Atherosclerotic heart disease of native coronary artery without angina pectoris: Secondary | ICD-10-CM

## 2012-04-25 DIAGNOSIS — N186 End stage renal disease: Secondary | ICD-10-CM

## 2012-04-25 DIAGNOSIS — I10 Essential (primary) hypertension: Secondary | ICD-10-CM

## 2012-04-25 NOTE — Assessment & Plan Note (Signed)
No signs of fluid overload or complaints of angina. Continue to follow.

## 2012-04-25 NOTE — Assessment & Plan Note (Signed)
Currently well controlled on medications prescribed. No changes at this time.

## 2012-04-25 NOTE — Assessment & Plan Note (Signed)
New dry wt of 76.5 kg is established, but he states it may be lower as he continues to lose weight. He is not obese with a wt of 173 lbs and BMI of 26.3. This will be closely monitored by nephrology.

## 2012-04-25 NOTE — Progress Notes (Signed)
HPI: [name] is a 78 y/o patient of Dr. Dietrich Pates we are following for ongoing assessment and treatment of  CAD, s/p CABG, hypertension, hyperlipidemia, with known history of ESRD with dialysis on MWF and anemia. He was recently admitted to [facility] with acute pulmonary edema and hyperkalemia. He was dialyzed with a new dry wt of 76.5 kg. He had been losing wt due to diet and was not being dialyzed to that lower wt prior to admission. This was felt to be the reason for fluid overload. He is feeling much better now, with no further complaints of DOE or fluid retention. Labs are being followed by Dr. Lowell Guitar, PCP. He has some chronic left knee pain and weakness for which he wears a cloth brace.  No Known Allergies  Current Outpatient Prescriptions  Medication Sig Dispense Refill  . acetaminophen (TYLENOL) 500 MG tablet Take 1,000 mg by mouth every 6 (six) hours as needed. For pain/headache      . allopurinol (ZYLOPRIM) 100 MG tablet Take 200 mg by mouth daily.       Marland Kitchen aspirin EC 81 MG tablet Take 81 mg by mouth daily.      Marland Kitchen atorvastatin (LIPITOR) 10 MG tablet Take 10 mg by mouth daily.       . B Complex-C-Zn-Folic Acid (DIALYVITE 800/ZINC PO) Take 1 tablet by mouth daily.       . calcium carbonate (TUMS - DOSED IN MG ELEMENTAL CALCIUM) 500 MG chewable tablet Chew 1 tablet by mouth daily as needed. For upset stomach      . cinacalcet (SENSIPAR) 30 MG tablet Take 30 mg by mouth daily.       . colchicine (COLCRYS) 0.6 MG tablet Take 0.6 mg by mouth 2 (two) times daily as needed. for gout      . docusate sodium (COLACE) 100 MG capsule Take 100 mg by mouth 2 (two) times daily.      Marland Kitchen gabapentin (NEURONTIN) 100 MG capsule Take 100 mg by mouth 2 (two) times daily.      Marland Kitchen omeprazole (PRILOSEC) 40 MG capsule Take 40 mg by mouth 2 (two) times daily. For reflux      . OVER THE COUNTER MEDICATION Apply 1 application topically daily as needed. For dry feet  otc healing balm      . polyethylene glycol powder  (MIRALAX) powder Take 17 g by mouth daily.       . sevelamer (RENAGEL) 800 MG tablet Take 1,600-3,200 mg by mouth 4 (four) times daily. Patient takes 4 tablets with meals and 2 tablets with snacks        Past Medical History  Diagnosis Date  . Secondary hyperparathyroidism   . Peripheral vascular disease   . Hyperlipidemia   . Duodenal ulcer     remote  . Gout STABLE  . ESRD (end stage renal disease) NEPHROLOGIST-   DR [name]    Hemodialysis; Hx of bleeding from aneurysms on AVF  . Arteriosclerotic cardiovascular disease (ASCVD) 1998    CABG-1998; h/o CHF  . Cerebrovascular disease REMOTE --  PER CHART--    NO RESIDUAL  . Neuropathy of foot COLD FEET  . Degenerative joint disease   . Gastroesophageal reflux disease   . History of bladder cancer   . Impaired hearing BILATERAL     ONLY WEARS LEFT HEARING AID  . Blood transfusion   . Anemia   . Renal cell carcinoma 2001    s/p right nephrectomy-2001; subsequent ESRD  .  Hypertension   . CHF (congestive heart failure)   . Renal insufficiency   . Coronary artery disease     Past Surgical History  Procedure Date  . Right nephrectomy 2001    Renal cell carcinoma  . Colonoscopy 01/24/2011    prominent vascular pattern, suboptimal prep but doable  . Esophagogastroduodenoscopy 01/24/2011    mild erosive reflux esophagitis, bulbar/antral erosions, bx from antrum benign  . Av fistula repair 2008    revision of anastomosis of Right AVF  . Arteriovenous graft placement 2006    Thrombectomy and interposition jump graft revision to higher axillary vein of LUA AVG  . Coronary artery bypass graft 1998    X4 VESSELS  . Multiple cysto/ resection tumor's with bx's LAST ONE 05-09-2011  . Multiple surg's / interventions for avgg/ fistula right upper arm LAST REVISION 06-29-2011    (JUNE 2010 STENT CEPHALIC VEIN/ 03-23-2011 DILATATION ANGIOPLASTY)  . Left ptc left renal artery   . Cardiac catheterization 2004  . Cataract extraction w/  intraocular lens  implant, bilateral   . Cystoscopy w/ retrogrades 12/28/2011    Procedure: CYSTOSCOPY WITH RETROGRADE PYELOGRAM;  Surgeon: [name], MD;  Location: [facility];  Service: Urology;  Laterality: Left;  C-ARM   . Transurethral resection of bladder tumor 12/28/2011    Procedure: TRANSURETHRAL RESECTION OF BLADDER TUMOR (TURBT);  Surgeon: [name], MD;  Location: [facility];  Service: Urology;  Laterality: N/A;  . Av fistula placement 02/29/2012    Procedure: INSERTION OF ARTERIOVENOUS (AV) GORE-TEX GRAFT THIGH;  Surgeon: [name], MD;  Location: [facility] OR;  Service: Vascular;  Laterality: Right;  Insertion right femoral arteriovenous gortex graft  . Ligation of right braciocephalic av fistula 04-04-2012    RUE:AVWUJW of systems complete and found to be negative unless listed above  PHYSICAL EXAM BP 118/56  Pulse 77  Resp 16  Ht 5\' 8"  (1.727 m)  Wt 173 lb (78.472 kg)  BMI 26.30 kg/m2 General: Well developed, well nourished, in no acute distress Head: Eyes PERRLA, No xanthomas.   Normal cephalic and atraumatic Lungs: Clear bilaterally to auscultation and percussion. Heart: HRRR S1 S2, soft 1/6 systolic murmur.Occaisional irregular beat.  Pulses are 2+ & equal.            No carotid bruit. No JVD.  No abdominal bruits. No femoral bruits. Abdomen: Bowel sounds are positive, abdomen soft and non-tender without masses or                  Hernia's noted. Msk:  Back normal, normal gait. Normal strength and tone for age. Extremities: No clubbing, cyanosis or edema.  DP +1.Right upper arm fistula with good thrill. Neuro: Alert and oriented X 3. Psych:  Good affect, responds appropriately  EKG:NSR rate of 75 bpm.  ASSESSMENT AND PLAN

## 2012-04-25 NOTE — Patient Instructions (Signed)
Your physician recommends that you schedule a follow-up appointment in 4 months with Dr. Rothbart. 

## 2012-05-08 ENCOUNTER — Other Ambulatory Visit (HOSPITAL_COMMUNITY): Payer: Self-pay | Admitting: Nephrology

## 2012-05-08 DIAGNOSIS — N186 End stage renal disease: Secondary | ICD-10-CM

## 2012-05-09 ENCOUNTER — Other Ambulatory Visit (HOSPITAL_COMMUNITY): Payer: Self-pay | Admitting: Nephrology

## 2012-05-09 ENCOUNTER — Ambulatory Visit (HOSPITAL_COMMUNITY)
Admission: RE | Admit: 2012-05-09 | Discharge: 2012-05-09 | Disposition: A | Discharge status: Home / Self Care | Payer: Medicare Other | Source: Ambulatory Visit | Attending: Nephrology | Admitting: Nephrology

## 2012-05-09 DIAGNOSIS — K219 Gastro-esophageal reflux disease without esophagitis: Secondary | ICD-10-CM | POA: Insufficient documentation

## 2012-05-09 DIAGNOSIS — Z8673 Personal history of transient ischemic attack (TIA), and cerebral infarction without residual deficits: Secondary | ICD-10-CM | POA: Insufficient documentation

## 2012-05-09 DIAGNOSIS — N186 End stage renal disease: Secondary | ICD-10-CM

## 2012-05-09 DIAGNOSIS — T82898A Other specified complication of vascular prosthetic devices, implants and grafts, initial encounter: Secondary | ICD-10-CM | POA: Insufficient documentation

## 2012-05-09 DIAGNOSIS — E785 Hyperlipidemia, unspecified: Secondary | ICD-10-CM | POA: Insufficient documentation

## 2012-05-09 DIAGNOSIS — I509 Heart failure, unspecified: Secondary | ICD-10-CM | POA: Insufficient documentation

## 2012-05-09 DIAGNOSIS — I120 Hypertensive chronic kidney disease with stage 5 chronic kidney disease or end stage renal disease: Secondary | ICD-10-CM | POA: Insufficient documentation

## 2012-05-09 DIAGNOSIS — Z992 Dependence on renal dialysis: Secondary | ICD-10-CM | POA: Insufficient documentation

## 2012-05-09 DIAGNOSIS — Y849 Medical procedure, unspecified as the cause of abnormal reaction of the patient, or of later complication, without mention of misadventure at the time of the procedure: Secondary | ICD-10-CM | POA: Insufficient documentation

## 2012-05-09 LAB — POTASSIUM: Potassium: 6.2 mEq/L — ABNORMAL HIGH (ref 3.5–5.1)

## 2012-05-09 MED ORDER — ALTEPLASE 100 MG IV SOLR
2.0000 mg | Freq: Once | INTRAVENOUS | Status: AC
  Administered 2012-05-09: 2 mg
  Filled 2012-05-09: qty 2

## 2012-05-09 MED ORDER — MIDAZOLAM HCL 5 MG/5ML IJ SOLN
INTRAMUSCULAR | Status: AC | PRN
  Administered 2012-05-09: 1 mg via INTRAVENOUS

## 2012-05-09 MED ORDER — FENTANYL CITRATE 0.05 MG/ML IJ SOLN
INTRAMUSCULAR | Status: AC
  Filled 2012-05-09: qty 4

## 2012-05-09 MED ORDER — SODIUM POLYSTYRENE SULFONATE 15 GM/60ML PO SUSP
60.0000 g | Freq: Once | ORAL | Status: DC
  Filled 2012-05-09: qty 240

## 2012-05-09 MED ORDER — FENTANYL CITRATE 0.05 MG/ML IJ SOLN
INTRAMUSCULAR | Status: AC | PRN
  Administered 2012-05-09: 25 ug via INTRAVENOUS

## 2012-05-09 MED ORDER — IOHEXOL 300 MG/ML  SOLN
100.0000 mL | Freq: Once | INTRAMUSCULAR | Status: AC | PRN
  Administered 2012-05-09: 50 mL via INTRAVENOUS

## 2012-05-09 MED ORDER — HEPARIN SODIUM (PORCINE) 1000 UNIT/ML IJ SOLN
INTRAMUSCULAR | Status: AC | PRN
  Administered 2012-05-09: 3000 [IU] via INTRAVENOUS

## 2012-05-09 MED ORDER — HEPARIN SODIUM (PORCINE) 1000 UNIT/ML IJ SOLN
INTRAMUSCULAR | Status: AC
  Filled 2012-05-09: qty 1

## 2012-05-09 MED ORDER — MIDAZOLAM HCL 2 MG/2ML IJ SOLN
INTRAMUSCULAR | Status: AC
  Filled 2012-05-09: qty 4

## 2012-05-09 NOTE — H&P (Signed)
Andrew [name] is an 78 y.o. male.   Chief Complaint: clotted right thigh dialysis graft HPI: Patient with ESRD and clotted right thigh AVGG presents today for thrombolysis, possible angioplasty/stenting of graft or placement of new catheter if needed.  Past Medical History  Diagnosis Date  . Secondary hyperparathyroidism   . Peripheral vascular disease   . Hyperlipidemia   . Duodenal ulcer     remote  . Gout STABLE  . ESRD (end stage renal disease) NEPHROLOGIST-   DR [name]    Hemodialysis; Hx of bleeding from aneurysms on AVF  . Arteriosclerotic cardiovascular disease (ASCVD) 1998    CABG-1998; h/o CHF  . Cerebrovascular disease REMOTE --  PER CHART--    NO RESIDUAL  . Neuropathy of foot COLD FEET  . Degenerative joint disease   . Gastroesophageal reflux disease   . History of bladder cancer   . Impaired hearing BILATERAL     ONLY WEARS LEFT HEARING AID  . Blood transfusion   . Anemia   . Renal cell carcinoma 2001    s/p right nephrectomy-2001; subsequent ESRD  . Hypertension   . CHF (congestive heart failure)   . Renal insufficiency   . Coronary artery disease     Past Surgical History  Procedure Date  . Right nephrectomy 2001    Renal cell carcinoma  . Colonoscopy 01/24/2011    prominent vascular pattern, suboptimal prep but doable  . Esophagogastroduodenoscopy 01/24/2011    mild erosive reflux esophagitis, bulbar/antral erosions, bx from antrum benign  . Av fistula repair 2008    revision of anastomosis of Right AVF  . Arteriovenous graft placement 2006    Thrombectomy and interposition jump graft revision to higher axillary vein of LUA AVG  . Coronary artery bypass graft 1998    X4 VESSELS  . Multiple cysto/ resection tumor's with bx's LAST ONE 05-09-2011  . Multiple surg's / interventions for avgg/ fistula right upper arm LAST REVISION 06-29-2011    (JUNE 2010 STENT CEPHALIC VEIN/ 03-23-2011 DILATATION ANGIOPLASTY)  . Left ptc left renal artery   . Cardiac  catheterization 2004  . Cataract extraction w/ intraocular lens  implant, bilateral   . Cystoscopy w/ retrogrades 12/28/2011    Procedure: CYSTOSCOPY WITH RETROGRADE PYELOGRAM;  Surgeon: [name], MD;  Location: [facility];  Service: Urology;  Laterality: Left;  C-ARM   . Transurethral resection of bladder tumor 12/28/2011    Procedure: TRANSURETHRAL RESECTION OF BLADDER TUMOR (TURBT);  Surgeon: [name], MD;  Location: [facility];  Service: Urology;  Laterality: N/A;  . Av fistula placement 02/29/2012    Procedure: INSERTION OF ARTERIOVENOUS (AV) GORE-TEX GRAFT THIGH;  Surgeon: [name], MD;  Location: [facility] OR;  Service: Vascular;  Laterality: Right;  Insertion right femoral arteriovenous gortex graft  . Ligation of right braciocephalic av fistula 04-04-2012    Family History  Problem Relation Age of Onset  . Colon cancer Neg Hx   . Liver disease Neg Hx   . Anesthesia problems Neg Hx   . Heart disease Father    Social History:  reports that he quit smoking about 28 years ago. His smoking use included Cigarettes. He has a 25 pack-year smoking history. He has never used smokeless tobacco. He reports that he does not drink alcohol or use illicit drugs.  Allergies: No Known Allergies  Current outpatient prescriptions:acetaminophen (TYLENOL) 500 MG tablet, Take 1,000 mg by mouth every 6 (six) hours as needed. For  pain/headache, Disp: , Rfl: ;  allopurinol (ZYLOPRIM) 100 MG tablet, Take 200 mg by mouth daily. , Disp: , Rfl: ;  aspirin EC 81 MG tablet, Take 81 mg by mouth daily., Disp: , Rfl: ;  atorvastatin (LIPITOR) 10 MG tablet, Take 10 mg by mouth daily. , Disp: , Rfl:  B Complex-C-Zn-Folic Acid (DIALYVITE 800/ZINC PO), Take 1 tablet by mouth daily. , Disp: , Rfl: ;  calcium carbonate (TUMS - DOSED IN MG ELEMENTAL CALCIUM) 500 MG chewable tablet, Chew 1 tablet by mouth daily as needed. For upset stomach, Disp: , Rfl: ;  cinacalcet (SENSIPAR) 30 MG tablet,  Take 30 mg by mouth daily. , Disp: , Rfl: ;  colchicine (COLCRYS) 0.6 MG tablet, Take 0.6 mg by mouth 2 (two) times daily as needed. for gout, Disp: , Rfl:  docusate sodium (COLACE) 100 MG capsule, Take 100 mg by mouth 2 (two) times daily., Disp: , Rfl: ;  gabapentin (NEURONTIN) 100 MG capsule, Take 100 mg by mouth 2 (two) times daily., Disp: , Rfl: ;  omeprazole (PRILOSEC) 40 MG capsule, Take 40 mg by mouth 2 (two) times daily. For reflux, Disp: , Rfl: ;  OVER THE COUNTER MEDICATION, Apply 1 application topically daily as needed. For dry feet  otc healing balm, Disp: , Rfl:  polyethylene glycol powder (MIRALAX) powder, Take 17 g by mouth daily. , Disp: , Rfl: ;  sevelamer (RENAGEL) 800 MG tablet, Take 1,600-3,200 mg by mouth 4 (four) times daily. Patient takes 4 tablets with meals and 2 tablets with snacks, Disp: , Rfl:  Current facility-administered medications:alteplase (ACTIVASE) injection 2 mg, 2 mg, Intracatheter, Once, Sheffield Slider, PA  Results for orders placed during the hospital encounter of 04/21/12  CBC      Component Value Range   WBC 4.5  4.0 - 10.5 K/uL   RBC 3.86 (*) 4.22 - 5.81 MIL/uL   Hemoglobin 12.4 (*) 13.0 - 17.0 g/dL   HCT 40.9  81.1 - 91.4 %   MCV 104.1 (*) 78.0 - 100.0 fL   MCH 32.1  26.0 - 34.0 pg   MCHC 30.8  30.0 - 36.0 g/dL   RDW 78.2 (*) 95.6 - 21.3 %   Platelets 129 (*) 150 - 400 K/uL  DIFFERENTIAL      Component Value Range   Neutrophils Relative 51  43 - 77 %   Neutro Abs 2.3  1.7 - 7.7 K/uL   Lymphocytes Relative 33  12 - 46 %   Lymphs Abs 1.5  0.7 - 4.0 K/uL   Monocytes Relative 5  3 - 12 %   Monocytes Absolute 0.2  0.1 - 1.0 K/uL   Eosinophils Relative 10 (*) 0 - 5 %   Eosinophils Absolute 0.5  0.0 - 0.7 K/uL   Basophils Relative 1  0 - 1 %   Basophils Absolute 0.0  0.0 - 0.1 K/uL  COMPREHENSIVE METABOLIC PANEL      Component Value Range   Sodium 137  135 - 145 mEq/L   Potassium 6.5 (*) 3.5 - 5.1 mEq/L   Chloride 94 (*) 96 - 112 mEq/L   CO2  32  19 - 32 mEq/L   Glucose, Bld 91  70 - 99 mg/dL   BUN 52 (*) 6 - 23 mg/dL   Creatinine, Ser 0.86 (*) 0.50 - 1.35 mg/dL   Calcium 8.5  8.4 - 57.8 mg/dL   Total Protein 6.6  6.0 - 8.3 g/dL   Albumin 3.6  3.5 -  5.2 g/dL   AST 20  0 - 37 U/L   ALT 12  0 - 53 U/L   Alkaline Phosphatase 128 (*) 39 - 117 U/L   Total Bilirubin 0.4  0.3 - 1.2 mg/dL   GFR calc non Af Amer 5 (*) >90 mL/min   GFR calc Af Amer 6 (*) >90 mL/min  PRO B NATRIURETIC PEPTIDE      Component Value Range   Pro B Natriuretic peptide (BNP) 25486.0 (*) 0 - 450 pg/mL  TROPONIN I      Component Value Range   Troponin I <0.30  <0.30 ng/mL  CBC      Component Value Range   WBC 3.9 (*) 4.0 - 10.5 K/uL   RBC 3.53 (*) 4.22 - 5.81 MIL/uL   Hemoglobin 11.4 (*) 13.0 - 17.0 g/dL   HCT 16.1 (*) 09.6 - 04.5 %   MCV 104.0 (*) 78.0 - 100.0 fL   MCH 32.3  26.0 - 34.0 pg   MCHC 31.1  30.0 - 36.0 g/dL   RDW 40.9 (*) 81.1 - 91.4 %   Platelets 114 (*) 150 - 400 K/uL  CREATININE, SERUM      Component Value Range   Creatinine, Ser 8.34 (*) 0.50 - 1.35 mg/dL   GFR calc non Af Amer 5 (*) >90 mL/min   GFR calc Af Amer 6 (*) >90 mL/min  CARDIAC PANEL(CRET KIN+CKTOT+MB+TROPI)      Component Value Range   Total CK 61  7 - 232 U/L   CK, MB 3.2  0.3 - 4.0 ng/mL   Troponin I <0.30  <0.30 ng/mL   Relative Index RELATIVE INDEX IS INVALID  0.0 - 2.5  CARDIAC PANEL(CRET KIN+CKTOT+MB+TROPI)      Component Value Range   Total CK 62  7 - 232 U/L   CK, MB 3.3  0.3 - 4.0 ng/mL   Troponin I <0.30  <0.30 ng/mL   Relative Index RELATIVE INDEX IS INVALID  0.0 - 2.5  HEPATITIS B SURFACE ANTIGEN      Component Value Range   Hepatitis B Surface Ag NEGATIVE  NEGATIVE  RENAL FUNCTION PANEL      Component Value Range   Sodium 137  135 - 145 mEq/L   Potassium 5.1  3.5 - 5.1 mEq/L   Chloride 97  96 - 112 mEq/L   CO2 31  19 - 32 mEq/L   Glucose, Bld 90  70 - 99 mg/dL   BUN 26 (*) 6 - 23 mg/dL   Creatinine, Ser 7.82 (*) 0.50 - 1.35 mg/dL   Calcium  8.5  8.4 - 95.6 mg/dL   Phosphorus 3.8  2.3 - 4.6 mg/dL   Albumin 3.3 (*) 3.5 - 5.2 g/dL   GFR calc non Af Amer 9 (*) >90 mL/min   GFR calc Af Amer 10 (*) >90 mL/min    Review of Systems  Constitutional: Negative for fever and chills.  Respiratory: Positive for shortness of breath. Negative for cough.   Cardiovascular: Negative for chest pain.  Gastrointestinal: Negative for nausea, vomiting and abdominal pain.  Musculoskeletal: Negative for back pain.  Neurological: Negative for headaches.  Endo/Heme/Allergies: Does not bruise/bleed easily.    Blood pressure 152/74, pulse 78, resp. rate 20, SpO2 96.00%. Physical Exam  Constitutional: He is oriented to person, place, and time. He appears well-developed and well-nourished.  Cardiovascular: Normal rate and regular rhythm.        Right thigh AVGG with no thrill/bruit  Respiratory: Effort  normal and breath sounds normal.  GI: Soft. Bowel sounds are normal. There is no tenderness.  Musculoskeletal: Normal range of motion. He exhibits no edema.  Neurological: He is alert and oriented to person, place, and time.     Assessment/Plan Patient with ESRD and clotted right thigh AVGG; plan is for thrombolysis, possible angioplasty/stenting of graft or placement of new catheter if needed. Details of above d/w pt with his understanding and consent.  Ramesha Poster,D KEVIN 05/09/2012, 3:00 PM

## 2012-05-09 NOTE — ED Notes (Signed)
Patient placed on monitor for K of 6.2.  Vilinda Blanks paged.

## 2012-05-09 NOTE — Discharge Instructions (Signed)
Sodium Polystyrene Sulfonate oral suspension What is this medicine? SODIUM POLYSTYRENE SULFONATE (SOE dee um pol ee STYE reen SUHL fuh neyt) takes potassium out of the body by binding to it in the intestines. It is used to treat too much potassium in the body. This medicine may be used for other purposes; ask your health care provider or pharmacist if you have questions. What should I tell my health care provider before I take this medicine? They need to know if you have any of these conditions: -constipation or gut obstruction -edema (water retention) -heart failure -high blood pressure -low levels of calcium or potassium in the blood -low sodium diet -an unusual or allergic reaction to sodium polystyrene, other medicines, foods, dyes, or preservatives -pregnant or trying to get pregnant -breast-feeding How should I use this medicine? This medicine is usually given in a hospital or clinic setting. It may be given by mouth or rectally. Follow the directions on the prescription label. Take your medicine at regular intervals. Do not take your medicine more often than directed. Talk to your pediatrician regarding the use of this medicine in children. Special care may be needed. Overdosage: If you think you have taken too much of this medicine contact a poison control center or emergency room at once. NOTE: This medicine is only for you. Do not share this medicine with others. What if I miss a dose? If you miss a dose, take it as soon as you can. If it is almost time for your next dose, take only that dose. Do not take double or extra doses. What may interact with this medicine? Do not take this medicine with any of the following medications: -sorbitol, unless your doctor tells you to This medicine may also interact with the following medications: -antacids with aluminum or magnesium -digoxin -laxatives with aluminum or magnesium -lithium -thyroxine This list may not describe all possible  interactions. Give your health care provider a list of all the medicines, herbs, non-prescription drugs, or dietary supplements you use. Also tell them if you smoke, drink alcohol, or use illegal drugs. Some items may interact with your medicine. What should I watch for while using this medicine? See your doctor for regular check ups. You will need important blood work and other testing done while you are taking this medicine. You may need to be on a special diet while you are taking this medicine. Discuss the foods you eat and the vitamins you take with your health care professional. Talk to your doctor about what to do if you are constipated. What side effects may I notice from receiving this medicine? Side effects that you should report to your doctor or health care professional as soon as possible: -allergic reactions like skin rash, itching or hives, swelling of the face, lips, or tongue -black or tar-like stool -breathing problems -confusion -fast, irregular heartbeat -feeling faint or lightheaded, falls -increased thirst -muscle spasms or cramps -passing large amounts of urine -rectal or lower stomach pain -slowed or trouble thinking -unusual swelling or water retention -unusual muscle weakness Side effects that usually do not require medical attention (report to your doctor or health care professional if they continue or are bothersome): -constipation or diarrhea -loss of appetite -nausea, vomiting This list may not describe all possible side effects. Call your doctor for medical advice about side effects. You may report side effects to FDA at 1-800-FDA-1088. Where should I keep my medicine? Keep out of the reach of children. Store at room temperature between  15 and 30 degrees C (59 and 86 degrees F). Keep in original container. Throw away any unused medicine after the expiration date. If stored in another container, store in the refrigerator and throw away after 14 days. NOTE:  This sheet is a summary. It may not cover all possible information. If you have questions about this medicine, talk to your doctor, pharmacist, or health care provider.  2012, [employer]vier/Gold Standard. (09/14/2010 3:22:40 PM)Sedation or Anesthesia, Adult Care After The medicines you received during the procedure or test today can sometimes cause you to be:  Confused.   Dizzy.   Sleepy.   Clumsy.  HOME CARE INSTRUCTIONS  Do not engage in any activity that requires you to be alert or coordinated for the next 24 hours. This includes driving, operating heavy machinery, using power tools, cooking, climbing, or riding a bicycle.  No swimming, hot tubs, or baths for the next 24 hours.   Stay with family, friends, or a caregiver for the next 24 hours.   Do not make any important decisions in the next 24 hours, such as signing contracts, making important commitments, or making expensive purchases.   Do not drink alcohol for 24 hours.   Avoid solid food if you feel sick to your stomach (have nausea) or if you vomit.   Drink plenty of fluids.   Only take over-the-counter or prescription medicines for pain, discomfort, or fever as directed by your caregiver.   Call your caregiver if you have persistent nausea or if you vomit more than once.   If you cannot reach your caregiver, go to or contact the emergency department.  SEEK IMMEDIATE MEDICAL CARE IF:   You vomit more than once.   You have strange or unusual behavior.   You have a fever.   You have difficulty urinating.   You have severe pain.  Document Released: 11/13/2005 Document Revised: 11/02/2011 Document Reviewed: 12/21/2008 [facility] Patient Information 2012 [place], [place].

## 2012-05-09 NOTE — Procedures (Signed)
Successful declot of right thigh AV graft.  No immediate complication.  See radiology report.

## 2012-05-09 NOTE — ED Notes (Signed)
Dr Lowella Dandy aware of K as well.

## 2012-05-10 ENCOUNTER — Emergency Department (HOSPITAL_COMMUNITY)
Admission: EM | Admit: 2012-05-10 | Discharge: 2012-05-11 | Disposition: A | Discharge status: Home / Self Care | Payer: Medicare Other | Attending: Emergency Medicine | Admitting: Emergency Medicine

## 2012-05-10 ENCOUNTER — Encounter (HOSPITAL_COMMUNITY): Payer: Self-pay | Admitting: Emergency Medicine

## 2012-05-10 DIAGNOSIS — K219 Gastro-esophageal reflux disease without esophagitis: Secondary | ICD-10-CM | POA: Insufficient documentation

## 2012-05-10 DIAGNOSIS — M1990 Unspecified osteoarthritis, unspecified site: Secondary | ICD-10-CM | POA: Insufficient documentation

## 2012-05-10 DIAGNOSIS — I509 Heart failure, unspecified: Secondary | ICD-10-CM | POA: Insufficient documentation

## 2012-05-10 DIAGNOSIS — I2510 Atherosclerotic heart disease of native coronary artery without angina pectoris: Secondary | ICD-10-CM | POA: Insufficient documentation

## 2012-05-10 DIAGNOSIS — N186 End stage renal disease: Secondary | ICD-10-CM | POA: Insufficient documentation

## 2012-05-10 DIAGNOSIS — Z87891 Personal history of nicotine dependence: Secondary | ICD-10-CM | POA: Insufficient documentation

## 2012-05-10 DIAGNOSIS — I120 Hypertensive chronic kidney disease with stage 5 chronic kidney disease or end stage renal disease: Secondary | ICD-10-CM | POA: Insufficient documentation

## 2012-05-10 DIAGNOSIS — R5381 Other malaise: Secondary | ICD-10-CM | POA: Insufficient documentation

## 2012-05-10 DIAGNOSIS — R531 Weakness: Secondary | ICD-10-CM

## 2012-05-10 DIAGNOSIS — E785 Hyperlipidemia, unspecified: Secondary | ICD-10-CM | POA: Insufficient documentation

## 2012-05-10 DIAGNOSIS — M109 Gout, unspecified: Secondary | ICD-10-CM | POA: Insufficient documentation

## 2012-05-10 DIAGNOSIS — I739 Peripheral vascular disease, unspecified: Secondary | ICD-10-CM | POA: Insufficient documentation

## 2012-05-10 DIAGNOSIS — Z85528 Personal history of other malignant neoplasm of kidney: Secondary | ICD-10-CM | POA: Insufficient documentation

## 2012-05-10 DIAGNOSIS — Z992 Dependence on renal dialysis: Secondary | ICD-10-CM

## 2012-05-10 LAB — BASIC METABOLIC PANEL
BUN: 35 mg/dL — ABNORMAL HIGH (ref 6–23)
CO2: 30 mEq/L (ref 19–32)
GFR calc non Af Amer: 8 mL/min — ABNORMAL LOW (ref >90)
Glucose, Bld: 79 mg/dL (ref 70–99)
Potassium: 3.6 mEq/L (ref 3.5–5.1)

## 2012-05-10 LAB — DIFFERENTIAL
Basophils Relative: 0 % (ref 0–1)
Eosinophils Absolute: 0.1 10*3/uL (ref 0.0–0.7)
Eosinophils Relative: 2 % (ref 0–5)
Lymphs Abs: 0.6 10*3/uL — ABNORMAL LOW (ref 0.7–4.0)
Monocytes Absolute: 0.4 10*3/uL (ref 0.1–1.0)
Monocytes Relative: 8 % (ref 3–12)
Neutrophils Relative %: 78 % — ABNORMAL HIGH (ref 43–77)

## 2012-05-10 LAB — CBC
Hemoglobin: 9.6 g/dL — ABNORMAL LOW (ref 13.0–17.0)
MCH: 31.7 pg (ref 26.0–34.0)
MCHC: 31.9 g/dL (ref 30.0–36.0)
MCV: 99.3 fL (ref 78.0–100.0)
RBC: 3.03 MIL/uL — ABNORMAL LOW (ref 4.22–5.81)

## 2012-05-10 NOTE — ED Notes (Signed)
Pt in triage waiting room.

## 2012-05-10 NOTE — ED Notes (Signed)
Nurse 1st Steward Drone, R.N. Made aware of pt's low b/p.

## 2012-05-10 NOTE — ED Notes (Signed)
N

## 2012-05-10 NOTE — ED Provider Notes (Signed)
History     CSN: [id number]  Arrival date & time 05/10/12  1944   First MD Initiated Contact with Patient 05/10/12 2256      Chief Complaint  Patient presents with  . Dizziness  . Weakness  . Altered Mental Status    (Consider location/radiation/quality/duration/timing/severity/associated sxs/prior treatment) HPI Comments: Patient with history of esrd on hd.  Presents complaining of weakness, difficulty walking.  He was seen by vascular yesterday and had procedure to restore flow to graft in his right groin.  He was given multiple doses of kayexalate and had multiple stools last night.  He was also given sedation for the procedure in his leg and was dialyzed today.    Patient is a 78 y.o. male presenting with weakness. The history is provided by the patient.  Weakness Primary symptoms comment: weakness in legs, difficulty with ambulation Episode onset: this afternoon. The symptoms are unchanged. The neurological symptoms are focal.  Additional symptoms include weakness.    Past Medical History  Diagnosis Date  . Secondary hyperparathyroidism   . Peripheral vascular disease   . Hyperlipidemia   . Duodenal ulcer     remote  . Gout STABLE  . ESRD (end stage renal disease) NEPHROLOGIST-   DR [name]    Hemodialysis; Hx of bleeding from aneurysms on AVF  . Arteriosclerotic cardiovascular disease (ASCVD) 1998    CABG-1998; h/o CHF  . Cerebrovascular disease REMOTE --  PER CHART--    NO RESIDUAL  . Neuropathy of foot COLD FEET  . Degenerative joint disease   . Gastroesophageal reflux disease   . History of bladder cancer   . Impaired hearing BILATERAL     ONLY WEARS LEFT HEARING AID  . Blood transfusion   . Anemia   . Renal cell carcinoma 2001    s/p right nephrectomy-2001; subsequent ESRD  . Hypertension   . CHF (congestive heart failure)   . Renal insufficiency   . Coronary artery disease     Past Surgical History  Procedure Date  . Right nephrectomy 2001    Renal  cell carcinoma  . Colonoscopy 01/24/2011    prominent vascular pattern, suboptimal prep but doable  . Esophagogastroduodenoscopy 01/24/2011    mild erosive reflux esophagitis, bulbar/antral erosions, bx from antrum benign  . Av fistula repair 2008    revision of anastomosis of Right AVF  . Arteriovenous graft placement 2006    Thrombectomy and interposition jump graft revision to higher axillary vein of LUA AVG  . Coronary artery bypass graft 1998    X4 VESSELS  . Multiple cysto/ resection tumor's with bx's LAST ONE 05-09-2011  . Multiple surg's / interventions for avgg/ fistula right upper arm LAST REVISION 06-29-2011    (JUNE 2010 STENT CEPHALIC VEIN/ 03-23-2011 DILATATION ANGIOPLASTY)  . Left ptc left renal artery   . Cardiac catheterization 2004  . Cataract extraction w/ intraocular lens  implant, bilateral   . Cystoscopy w/ retrogrades 12/28/2011    Procedure: CYSTOSCOPY WITH RETROGRADE PYELOGRAM;  Surgeon: [name], MD;  Location: [facility];  Service: Urology;  Laterality: Left;  C-ARM   . Transurethral resection of bladder tumor 12/28/2011    Procedure: TRANSURETHRAL RESECTION OF BLADDER TUMOR (TURBT);  Surgeon: [name], MD;  Location[facility];  Service: Urology;  Laterality: N/A;  . Av fistula placement 02/29/2012    Procedure: INSERTION OF ARTERIOVENOUS (AV) GORE-TEX GRAFT THIGH;  Surgeon: [name], MD;  Location[facility] OR;  Service: Vascular;  Laterality: Right;  Insertion right femoral arteriovenous gortex graft  . Ligation of right braciocephalic av fistula 04-04-2012    Family History  Problem Relation Age of Onset  . Colon cancer Neg Hx   . Liver disease Neg Hx   . Anesthesia problems Neg Hx   . Heart disease Father     History  Substance Use Topics  . Smoking status: Former Smoker -- 1.0 packs/day for 25 years    Types: Cigarettes    Quit date: 02/26/1984  . Smokeless tobacco: Never Used   Comment: quit 1985  . Alcohol  Use: No      Review of Systems  Neurological: Positive for weakness.  All other systems reviewed and are negative.    Allergies  Review of patient's allergies indicates no known allergies.  Home Medications   Current Outpatient Rx  Name Route Sig Dispense Refill  . ACETAMINOPHEN 500 MG PO TABS Oral Take 1,000 mg by mouth every 6 (six) hours as needed. For pain/headache    . ALLOPURINOL 100 MG PO TABS Oral Take 100 mg by mouth 2 (two) times daily.     . ASPIRIN EC 81 MG PO TBEC Oral Take 81 mg by mouth daily.    . ATORVASTATIN CALCIUM 10 MG PO TABS Oral Take 10 mg by mouth daily.     Marland Kitchen DIALYVITE 800/ZINC PO Oral Take 1 tablet by mouth daily.     Marland Kitchen CALCIUM CARBONATE ANTACID 500 MG PO CHEW Oral Chew 1 tablet by mouth daily as needed. For upset stomach    . CEFADROXIL 500 MG PO CAPS Oral Take 500 mg by mouth 2 (two) times daily.    . COLCHICINE 0.6 MG PO TABS Oral Take 0.6 mg by mouth 2 (two) times daily as needed. for gout    . GABAPENTIN 100 MG PO CAPS Oral Take 200 mg by mouth 2 (two) times daily.     Marland Kitchen OMEPRAZOLE 40 MG PO CPDR Oral Take 40 mg by mouth 2 (two) times daily. For reflux    . POLYETHYLENE GLYCOL 3350 PO POWD Oral Take 17 g by mouth daily.     . SODIUM POLYSTYRENE SULFONATE 15 GM/60ML PO SUSP Oral Take 45 g by mouth once.      BP 124/47  Pulse 81  Temp 99.1 F (37.3 C) (Oral)  Resp 16  SpO2 98%  Physical Exam  Nursing note and vitals reviewed. Constitutional: He is oriented to person, place, and time. He appears well-developed and well-nourished. No distress.  HENT:  Head: Normocephalic and atraumatic.  Eyes: Pupils are equal, round, and reactive to light.  Neck: Normal range of motion. Neck supple.  Cardiovascular: Normal rate.   No murmur heard. Pulmonary/Chest: Effort normal and breath sounds normal. No respiratory distress. He has no wheezes.  Abdominal: Soft. Bowel sounds are normal. He exhibits no distension. There is no tenderness.  Musculoskeletal:  Normal range of motion. He exhibits no edema.  Neurological: He is alert and oriented to person, place, and time.  Skin: Skin is warm and dry. He is not diaphoretic.    ED Course  Procedures (including critical care time)  Labs Reviewed  BASIC METABOLIC PANEL - Abnormal; Notable for the following:    Chloride 95 (*)     BUN 35 (*)     Creatinine, Ser 5.78 (*)     GFR calc non Af Amer 8 (*)     GFR calc Af Amer 9 (*)  All other components within normal limits  CBC - Abnormal; Notable for the following:    RBC 3.03 (*)     Hemoglobin 9.6 (*)     HCT 30.1 (*)     RDW 16.0 (*)     Platelets 78 (*)  PLATELET COUNT CONFIRMED BY SMEAR   All other components within normal limits  DIFFERENTIAL - Abnormal; Notable for the following:    Neutrophils Relative 78 (*)     Lymphs Abs 0.6 (*)     All other components within normal limits   Ir Pta Venous Right  05/09/2012  *RADIOLOGY REPORT*  Clinical history:End-stage renal disease and clotted right lower extremity graft  PROCEDURE(S): DIALYSIS GRAFT DECLOT; GRAFT/VENOUS PTA; ULTRASOUND GUIDANCE FOR VASCULAR ACCESS  Physician: Rachelle Hora. Henn, MD  Medications:Heparin 3000 units, TPA 2 mg, Versed 1.00 mg, Fentanyl22mcg. A radiology nurse monitored the patient for moderate sedation.  Moderate sedation time:30 minutes  Fluoroscopy time: 4.2 minutes  Contrast: 50 ml Omnipaque 300  Procedure:Informed consent was obtained for a declot procedure. The patient understood the risks of pulmonary embolism.  The right thigh was prepped and draped in a sterile fashion.  Maximal barrier sterile technique was utilized including caps, mask, sterile gowns, sterile gloves, sterile drape, hand hygiene and skin antiseptic. The skin was anesthetized with 1% lidocaine.  The graft was accessed using 21 gauge needles towards the venous and arterial anastomoses with ultrasound guidance.  Ultrasound images were obtained for documentation. Micropuncture catheters were placed.  2 mg  of TPA was infused through the micropuncture catheters.  The vascular access pointing towards the central veins was upsized to a 7-French vascular sheath.  A 5-French catheter was advanced into the central venous structures and a central venogram was performed. Fluoroscopic images were saved for documentation.  The catheter pointing toward the arterial anastomosis was exchanged for a 6- [place] sheath.  The graft was treated with the Arrow PTD thrombectomy device.  The venous anastomosis was angioplastied with a 7 mm x 40 mm balloon.  A wire was advanced into the arterial system.  The arterial plug was pulled using a 5 [place] Fogarty balloon.  There was flow in the graft. The venous anastomosis was treated again with the 7 mm x 40 mm balloon.  Follow-up shuntogram images were obtained.  The PTD device was used along the arterial limb of the graft near the vascular sheath.  Final shuntogram images were obtained.  The vascular sheaths were removed with purse string sutures.  No immediate complication.  Findings:The right thigh graft was occluded.  Central veins are patent.  At the end of the procedure, there was excellent flow throughout the graft.  Impression:Successful declot of the right lowerextremity graft.  Access management:  The right thigh graft remains amendable to percutaneous treatment.  Original Report Authenticated By: Richarda Overlie, M.D.   Ir Av Dialysis Graft Declot  05/09/2012  *RADIOLOGY REPORT*  Clinical history:End-stage renal disease and clotted right lower extremity graft  PROCEDURE(S): DIALYSIS GRAFT DECLOT; GRAFT/VENOUS PTA; ULTRASOUND GUIDANCE FOR VASCULAR ACCESS  Physician: Rachelle Hora. Henn, MD  Medications:Heparin 3000 units, TPA 2 mg, Versed 1.00 mg, Fentanyl31mcg. A radiology nurse monitored the patient for moderate sedation.  Moderate sedation time:30 minutes  Fluoroscopy time: 4.2 minutes  Contrast: 50 ml Omnipaque 300  Procedure:Informed consent was obtained for a declot procedure. The  patient understood the risks of pulmonary embolism.  The right thigh was prepped and draped in a sterile fashion.  Maximal barrier sterile technique  was utilized including caps, mask, sterile gowns, sterile gloves, sterile drape, hand hygiene and skin antiseptic. The skin was anesthetized with 1% lidocaine.  The graft was accessed using 21 gauge needles towards the venous and arterial anastomoses with ultrasound guidance.  Ultrasound images were obtained for documentation. Micropuncture catheters were placed.  2 mg of TPA was infused through the micropuncture catheters.  The vascular access pointing towards the central veins was upsized to a 7-French vascular sheath.  A 5-French catheter was advanced into the central venous structures and a central venogram was performed. Fluoroscopic images were saved for documentation.  The catheter pointing toward the arterial anastomosis was exchanged for a 6- [place] sheath.  The graft was treated with the Arrow PTD thrombectomy device.  The venous anastomosis was angioplastied with a 7 mm x 40 mm balloon.  A wire was advanced into the arterial system.  The arterial plug was pulled using a 5 [place] Fogarty balloon.  There was flow in the graft. The venous anastomosis was treated again with the 7 mm x 40 mm balloon.  Follow-up shuntogram images were obtained.  The PTD device was used along the arterial limb of the graft near the vascular sheath.  Final shuntogram images were obtained.  The vascular sheaths were removed with purse string sutures.  No immediate complication.  Findings:The right thigh graft was occluded.  Central veins are patent.  At the end of the procedure, there was excellent flow throughout the graft.  Impression:Successful declot of the right lowerextremity graft.  Access management:  The right thigh graft remains amendable to percutaneous treatment.  Original Report Authenticated By: Richarda Overlie, M.D.   Ir Angio Av Shunt Addl Access  05/09/2012  *RADIOLOGY  REPORT*  Clinical history:End-stage renal disease and clotted right lower extremity graft  PROCEDURE(S): DIALYSIS GRAFT DECLOT; GRAFT/VENOUS PTA; ULTRASOUND GUIDANCE FOR VASCULAR ACCESS  Physician: Rachelle Hora. Henn, MD  Medications:Heparin 3000 units, TPA 2 mg, Versed 1.00 mg, Fentanyl80mcg. A radiology nurse monitored the patient for moderate sedation.  Moderate sedation time:30 minutes  Fluoroscopy time: 4.2 minutes  Contrast: 50 ml Omnipaque 300  Procedure:Informed consent was obtained for a declot procedure. The patient understood the risks of pulmonary embolism.  The right thigh was prepped and draped in a sterile fashion.  Maximal barrier sterile technique was utilized including caps, mask, sterile gowns, sterile gloves, sterile drape, hand hygiene and skin antiseptic. The skin was anesthetized with 1% lidocaine.  The graft was accessed using 21 gauge needles towards the venous and arterial anastomoses with ultrasound guidance.  Ultrasound images were obtained for documentation. Micropuncture catheters were placed.  2 mg of TPA was infused through the micropuncture catheters.  The vascular access pointing towards the central veins was upsized to a 7-French vascular sheath.  A 5-French catheter was advanced into the central venous structures and a central venogram was performed. Fluoroscopic images were saved for documentation.  The catheter pointing toward the arterial anastomosis was exchanged for a 6- [place] sheath.  The graft was treated with the Arrow PTD thrombectomy device.  The venous anastomosis was angioplastied with a 7 mm x 40 mm balloon.  A wire was advanced into the arterial system.  The arterial plug was pulled using a 5 [place] Fogarty balloon.  There was flow in the graft. The venous anastomosis was treated again with the 7 mm x 40 mm balloon.  Follow-up shuntogram images were obtained.  The PTD device was used along the arterial limb of the graft near the  vascular sheath.  Final shuntogram images  were obtained.  The vascular sheaths were removed with purse string sutures.  No immediate complication.  Findings:The right thigh graft was occluded.  Central veins are patent.  At the end of the procedure, there was excellent flow throughout the graft.  Impression:Successful declot of the right lowerextremity graft.  Access management:  The right thigh graft remains amendable to percutaneous treatment.  Original Report Authenticated By: Richarda Overlie, M.D.   Ir US Guide Vasc Access Right  05/09/2012  *RADIOLOGY REPORT*  Clinical history:End-stage renal disease and clotted right lower extremity graft  PROCEDURE(S): DIALYSIS GRAFT DECLOT; GRAFT/VENOUS PTA; ULTRASOUND GUIDANCE FOR VASCULAR ACCESS  Physician: Rachelle Hora. Henn, MD  Medications:Heparin 3000 units, TPA 2 mg, Versed 1.00 mg, Fentanyl77mcg. A radiology nurse monitored the patient for moderate sedation.  Moderate sedation time:30 minutes  Fluoroscopy time: 4.2 minutes  Contrast: 50 ml Omnipaque 300  Procedure:Informed consent was obtained for a declot procedure. The patient understood the risks of pulmonary embolism.  The right thigh was prepped and draped in a sterile fashion.  Maximal barrier sterile technique was utilized including caps, mask, sterile gowns, sterile gloves, sterile drape, hand hygiene and skin antiseptic. The skin was anesthetized with 1% lidocaine.  The graft was accessed using 21 gauge needles towards the venous and arterial anastomoses with ultrasound guidance.  Ultrasound images were obtained for documentation. Micropuncture catheters were placed.  2 mg of TPA was infused through the micropuncture catheters.  The vascular access pointing towards the central veins was upsized to a 7-French vascular sheath.  A 5-French catheter was advanced into the central venous structures and a central venogram was performed. Fluoroscopic images were saved for documentation.  The catheter pointing toward the arterial anastomosis was exchanged for a 6-  [place] sheath.  The graft was treated with the Arrow PTD thrombectomy device.  The venous anastomosis was angioplastied with a 7 mm x 40 mm balloon.  A wire was advanced into the arterial system.  The arterial plug was pulled using a 5 [place] Fogarty balloon.  There was flow in the graft. The venous anastomosis was treated again with the 7 mm x 40 mm balloon.  Follow-up shuntogram images were obtained.  The PTD device was used along the arterial limb of the graft near the vascular sheath.  Final shuntogram images were obtained.  The vascular sheaths were removed with purse string sutures.  No immediate complication.  Findings:The right thigh graft was occluded.  Central veins are patent.  At the end of the procedure, there was excellent flow throughout the graft.  Impression:Successful declot of the right lowerextremity graft.  Access management:  The right thigh graft remains amendable to percutaneous treatment.  Original Report Authenticated By: Richarda Overlie, M.D.     No diagnosis found.   Date: 05/10/2012  Rate: 101  Rhythm: normal sinus rhythm  QRS Axis: normal  Intervals: normal  ST/T Wave abnormalities: normal  Conduction Disutrbances:none  Narrative Interpretation:   Old EKG Reviewed: unchanged    MDM  The patient seems to be feeling better after a small meal.  The labs and ekg do not show anything acute.  He was ambulated and is much more steady.  I feel as though he is stable for discharge to home.  I suspect the cause of his unsteadiness is multifactorial:  Kayexalate with multiple bms, meds for graft revision, dialysis today, and lack of nourishment.          Geoffery Lyons, MD  05/11/12 0121 

## 2012-05-10 NOTE — ED Notes (Signed)
DAUGHTER REPORTS PT. IS CONFUSED AFTER HEMODIALYSIS TODAY , PT. STATES GENERALIZED WEAKNESS WITH DIZZINESS AT TRIAGE , ALERT AND ORIENTED , RESPIRATIONS UNLABORED ,  SPEECH CLEAR , NO FACIAL ASYMMETRY , EQUAL STRONG GRIPS , DENIES PAIN .

## 2012-05-11 NOTE — Discharge Instructions (Signed)

## 2012-05-15 ENCOUNTER — Inpatient Hospital Stay (HOSPITAL_COMMUNITY): Payer: Medicare Other

## 2012-05-15 ENCOUNTER — Other Ambulatory Visit (HOSPITAL_COMMUNITY): Payer: Self-pay | Admitting: Hematology

## 2012-05-15 ENCOUNTER — Inpatient Hospital Stay (HOSPITAL_COMMUNITY)
Admission: AD | Admit: 2012-05-15 | Discharge: 2012-05-17 | DRG: 252 | Disposition: A | Discharge status: Home / Self Care | Payer: Medicare Other | Source: Ambulatory Visit | Attending: Nephrology | Admitting: Nephrology

## 2012-05-15 ENCOUNTER — Other Ambulatory Visit (HOSPITAL_COMMUNITY): Payer: Self-pay | Admitting: Nephrology

## 2012-05-15 ENCOUNTER — Ambulatory Visit (HOSPITAL_COMMUNITY)
Admission: RE | Admit: 2012-05-15 | Discharge: 2012-05-15 | Disposition: A | Discharge status: Home / Self Care | Payer: Medicare Other | Source: Ambulatory Visit | Attending: Nephrology | Admitting: Nephrology

## 2012-05-15 DIAGNOSIS — N186 End stage renal disease: Secondary | ICD-10-CM

## 2012-05-15 DIAGNOSIS — T8249XA Other complication of vascular dialysis catheter, initial encounter: Secondary | ICD-10-CM | POA: Diagnosis present

## 2012-05-15 DIAGNOSIS — I10 Essential (primary) hypertension: Secondary | ICD-10-CM | POA: Diagnosis present

## 2012-05-15 DIAGNOSIS — R079 Chest pain, unspecified: Secondary | ICD-10-CM

## 2012-05-15 DIAGNOSIS — R0609 Other forms of dyspnea: Secondary | ICD-10-CM | POA: Diagnosis present

## 2012-05-15 DIAGNOSIS — N2581 Secondary hyperparathyroidism of renal origin: Secondary | ICD-10-CM | POA: Diagnosis present

## 2012-05-15 DIAGNOSIS — I2510 Atherosclerotic heart disease of native coronary artery without angina pectoris: Secondary | ICD-10-CM | POA: Diagnosis present

## 2012-05-15 DIAGNOSIS — I5031 Acute diastolic (congestive) heart failure: Secondary | ICD-10-CM

## 2012-05-15 DIAGNOSIS — Y849 Medical procedure, unspecified as the cause of abnormal reaction of the patient, or of later complication, without mention of misadventure at the time of the procedure: Secondary | ICD-10-CM | POA: Diagnosis present

## 2012-05-15 DIAGNOSIS — Z85528 Personal history of other malignant neoplasm of kidney: Secondary | ICD-10-CM

## 2012-05-15 DIAGNOSIS — Z87891 Personal history of nicotine dependence: Secondary | ICD-10-CM

## 2012-05-15 DIAGNOSIS — R0989 Other specified symptoms and signs involving the circulatory and respiratory systems: Secondary | ICD-10-CM | POA: Diagnosis present

## 2012-05-15 DIAGNOSIS — T82898A Other specified complication of vascular prosthetic devices, implants and grafts, initial encounter: Principal | ICD-10-CM | POA: Diagnosis present

## 2012-05-15 DIAGNOSIS — Z951 Presence of aortocoronary bypass graft: Secondary | ICD-10-CM

## 2012-05-15 DIAGNOSIS — I120 Hypertensive chronic kidney disease with stage 5 chronic kidney disease or end stage renal disease: Secondary | ICD-10-CM | POA: Diagnosis present

## 2012-05-15 DIAGNOSIS — D631 Anemia in chronic kidney disease: Secondary | ICD-10-CM | POA: Diagnosis present

## 2012-05-15 DIAGNOSIS — R0600 Dyspnea, unspecified: Secondary | ICD-10-CM

## 2012-05-15 DIAGNOSIS — Z8551 Personal history of malignant neoplasm of bladder: Secondary | ICD-10-CM

## 2012-05-15 DIAGNOSIS — H9190 Unspecified hearing loss, unspecified ear: Secondary | ICD-10-CM | POA: Diagnosis present

## 2012-05-15 DIAGNOSIS — D630 Anemia in neoplastic disease: Secondary | ICD-10-CM | POA: Diagnosis present

## 2012-05-15 DIAGNOSIS — D696 Thrombocytopenia, unspecified: Secondary | ICD-10-CM | POA: Diagnosis present

## 2012-05-15 DIAGNOSIS — E785 Hyperlipidemia, unspecified: Secondary | ICD-10-CM | POA: Diagnosis present

## 2012-05-15 DIAGNOSIS — Z992 Dependence on renal dialysis: Secondary | ICD-10-CM

## 2012-05-15 LAB — RENAL FUNCTION PANEL
Albumin: 3.5 g/dL (ref 3.5–5.2)
Calcium: 8.8 mg/dL (ref 8.4–10.5)
Creatinine, Ser: 9.14 mg/dL — ABNORMAL HIGH (ref 0.50–1.35)
GFR calc non Af Amer: 5 mL/min — ABNORMAL LOW (ref >90)

## 2012-05-15 LAB — POTASSIUM: Potassium: 4.4 mEq/L (ref 3.5–5.1)

## 2012-05-15 LAB — CBC
Platelets: 74 10*3/uL — ABNORMAL LOW (ref 150–400)
RBC: 3.19 MIL/uL — ABNORMAL LOW (ref 4.22–5.81)
WBC: 3 10*3/uL — ABNORMAL LOW (ref 4.0–10.5)

## 2012-05-15 MED ORDER — SODIUM CHLORIDE 0.9 % IV SOLN: 100.0000 mL | INTRAVENOUS | Status: DC | PRN

## 2012-05-15 MED ORDER — ACETAMINOPHEN 500 MG PO TABS
1000.0000 mg | ORAL_TABLET | Freq: Four times a day (QID) | ORAL | Status: DC | PRN
  Filled 2012-05-15: qty 2

## 2012-05-15 MED ORDER — BISACODYL 10 MG RE SUPP: 10.0000 mg | Freq: Every day | RECTAL | Status: DC | PRN

## 2012-05-15 MED ORDER — ASPIRIN EC 81 MG PO TBEC
81.0000 mg | DELAYED_RELEASE_TABLET | Freq: Every day | ORAL | Status: DC
  Administered 2012-05-16 – 2012-05-17 (×2): 81 mg via ORAL
  Filled 2012-05-15 (×2): qty 1

## 2012-05-15 MED ORDER — PANTOPRAZOLE SODIUM 40 MG PO TBEC
80.0000 mg | DELAYED_RELEASE_TABLET | Freq: Every day | ORAL | Status: DC
  Administered 2012-05-16 – 2012-05-17 (×2): 80 mg via ORAL
  Filled 2012-05-15: qty 2
  Filled 2012-05-15 (×2): qty 1

## 2012-05-15 MED ORDER — SODIUM CHLORIDE 0.9 % IJ SOLN: 3.0000 mL | INTRAMUSCULAR | Status: DC | PRN

## 2012-05-15 MED ORDER — ALLOPURINOL 100 MG PO TABS
200.0000 mg | ORAL_TABLET | Freq: Every day | ORAL | Status: DC
  Administered 2012-05-16 – 2012-05-17 (×2): 200 mg via ORAL
  Filled 2012-05-15 (×2): qty 2

## 2012-05-15 MED ORDER — METHYLPREDNISOLONE SODIUM SUCC 125 MG IJ SOLR
INTRAMUSCULAR | Status: AC
  Administered 2012-05-15: 60 mg
  Filled 2012-05-15: qty 2

## 2012-05-15 MED ORDER — DIALYVITE 800/ZINC 0.8 MG PO TABS: 1.0000 | ORAL_TABLET | Freq: Every morning | ORAL | Status: DC

## 2012-05-15 MED ORDER — NEPRO/CARBSTEADY PO LIQD: 237.0000 mL | ORAL | Status: DC | PRN

## 2012-05-15 MED ORDER — ALBUTEROL SULFATE (5 MG/ML) 0.5% IN NEBU
2.5000 mg | INHALATION_SOLUTION | RESPIRATORY_TRACT | Status: AC
  Administered 2012-05-15 – 2012-05-16 (×2): 2.5 mg via RESPIRATORY_TRACT
  Filled 2012-05-15 (×2): qty 0.5

## 2012-05-15 MED ORDER — HEPARIN SODIUM (PORCINE) 1000 UNIT/ML DIALYSIS
1000.0000 [IU] | INTRAMUSCULAR | Status: DC | PRN
  Filled 2012-05-15: qty 1

## 2012-05-15 MED ORDER — ALBUTEROL SULFATE (5 MG/ML) 0.5% IN NEBU: 2.5000 mg | INHALATION_SOLUTION | RESPIRATORY_TRACT | Status: DC | PRN

## 2012-05-15 MED ORDER — SODIUM CHLORIDE 0.9 % IJ SOLN
3.0000 mL | Freq: Two times a day (BID) | INTRAMUSCULAR | Status: DC
  Administered 2012-05-16: 3 mL via INTRAVENOUS

## 2012-05-15 MED ORDER — ATORVASTATIN CALCIUM 10 MG PO TABS
10.0000 mg | ORAL_TABLET | Freq: Every day | ORAL | Status: DC
  Administered 2012-05-16 – 2012-05-17 (×2): 10 mg via ORAL
  Filled 2012-05-15 (×2): qty 1

## 2012-05-15 MED ORDER — CALCIUM CARBONATE ANTACID 500 MG PO CHEW
1.0000 | CHEWABLE_TABLET | Freq: Every day | ORAL | Status: DC | PRN
  Filled 2012-05-15: qty 1

## 2012-05-15 MED ORDER — HEPARIN SODIUM (PORCINE) 1000 UNIT/ML IJ SOLN
INTRAMUSCULAR | Status: AC
  Filled 2012-05-15: qty 1

## 2012-05-15 MED ORDER — HEPARIN SODIUM (PORCINE) 5000 UNIT/ML IJ SOLN
5000.0000 [IU] | Freq: Three times a day (TID) | INTRAMUSCULAR | Status: DC
Start: 2012-05-16 — End: 2012-05-17
  Administered 2012-05-16 – 2012-05-17 (×2): 5000 [IU] via SUBCUTANEOUS
  Filled 2012-05-15 (×6): qty 1

## 2012-05-15 MED ORDER — POLYETHYLENE GLYCOL 3350 17 GM/SCOOP PO POWD
17.0000 g | Freq: Every day | ORAL | Status: DC
  Administered 2012-05-16: 17 g via ORAL
  Filled 2012-05-15 (×2): qty 255

## 2012-05-15 MED ORDER — SODIUM CHLORIDE 0.9 % IJ SOLN
3.0000 mL | Freq: Two times a day (BID) | INTRAMUSCULAR | Status: DC
  Administered 2012-05-17: 3 mL via INTRAVENOUS

## 2012-05-15 MED ORDER — HEPARIN SODIUM (PORCINE) 1000 UNIT/ML DIALYSIS
20.0000 [IU]/kg | INTRAMUSCULAR | Status: DC | PRN
  Administered 2012-05-15: 1600 [IU] via INTRAVENOUS_CENTRAL
  Filled 2012-05-15: qty 2

## 2012-05-15 MED ORDER — COLCHICINE 0.6 MG PO TABS
0.6000 mg | ORAL_TABLET | Freq: Two times a day (BID) | ORAL | Status: DC | PRN
  Filled 2012-05-15: qty 1

## 2012-05-15 MED ORDER — RENA-VITE PO TABS
1.0000 | ORAL_TABLET | Freq: Every day | ORAL | Status: DC
  Administered 2012-05-16: 1 via ORAL
  Filled 2012-05-15 (×2): qty 1

## 2012-05-15 MED ORDER — METHYLPREDNISOLONE SODIUM SUCC 125 MG IJ SOLR: 60.0000 mg | Freq: Once | INTRAMUSCULAR | Status: AC

## 2012-05-15 MED ORDER — SODIUM CHLORIDE 0.9 % IV SOLN: 250.0000 mL | INTRAVENOUS | Status: DC | PRN

## 2012-05-15 MED ORDER — ALBUTEROL SULFATE (5 MG/ML) 0.5% IN NEBU
INHALATION_SOLUTION | RESPIRATORY_TRACT | Status: AC
  Administered 2012-05-15: 2.5 mg via RESPIRATORY_TRACT
  Filled 2012-05-15: qty 0.5

## 2012-05-15 MED ORDER — GABAPENTIN 100 MG PO CAPS
100.0000 mg | ORAL_CAPSULE | Freq: Two times a day (BID) | ORAL | Status: DC
  Administered 2012-05-16 – 2012-05-17 (×4): 100 mg via ORAL
  Filled 2012-05-15 (×5): qty 1

## 2012-05-15 MED ORDER — METHYLPREDNISOLONE SODIUM SUCC 40 MG IJ SOLR
40.0000 mg | Freq: Four times a day (QID) | INTRAMUSCULAR | Status: AC
  Administered 2012-05-16 (×2): 40 mg via INTRAVENOUS
  Filled 2012-05-15 (×6): qty 1

## 2012-05-15 NOTE — H&P (Signed)
Andrew [name] is an 78 y.o. male.  Chief Complaint: SOB and clotted AVF HPI: 78 yo WM with hx of ESRD, bladder Ca, CAD with hx CABG who is admitted for clotted access. He presented to outpt HD today in [place] with clotted AVG of R thigh.  He was also very dyspneic. It was late in the day and he couldn't on the IR schedule for thrombolysis, so IR placed a temporary HD cath and the renal service is admitting him to 6700.  He has been SOB less than 24 hrs, denies any fluid indiscretion, prod cough, fever, chills, CP.  He is only 1-2 kg over his dry weight.  ? Hx of COPD.  Smoked for 25 yrs, quit 25 yrs ago.   Past Medical History:  Past Medical History  Diagnosis Date  . Secondary hyperparathyroidism   . Peripheral vascular disease   . Hyperlipidemia   . Duodenal ulcer     remote  . Gout STABLE  . ESRD (end stage renal disease) NEPHROLOGIST-   DR [name]    Hemodialysis; Hx of bleeding from aneurysms on AVF  . Arteriosclerotic cardiovascular disease (ASCVD) 1998    CABG-1998; h/o CHF  . Cerebrovascular disease REMOTE --  PER CHART--    NO RESIDUAL  . Neuropathy of foot COLD FEET  . Degenerative joint disease   . Gastroesophageal reflux disease   . History of bladder cancer   . Impaired hearing BILATERAL     ONLY WEARS LEFT HEARING AID  . Blood transfusion   . Anemia   . Renal cell carcinoma 2001    s/p right nephrectomy-2001; subsequent ESRD  . Hypertension   . CHF (congestive heart failure)   . Renal insufficiency   . Coronary artery disease     Past Surgical History:  Past Surgical History  Procedure Date  . Right nephrectomy 2001    Renal cell carcinoma  . Colonoscopy 01/24/2011    prominent vascular pattern, suboptimal prep but doable  . Esophagogastroduodenoscopy 01/24/2011    mild erosive reflux esophagitis, bulbar/antral erosions, bx from antrum benign  . Av fistula repair 2008    revision of anastomosis of Right AVF  . Arteriovenous graft placement 2006   Thrombectomy and interposition jump graft revision to higher axillary vein of LUA AVG  . Coronary artery bypass graft 1998    X4 VESSELS  . Multiple cysto/ resection tumor's with bx's LAST ONE 05-09-2011  . Multiple surg's / interventions for avgg/ fistula right upper arm LAST REVISION 06-29-2011    (JUNE 2010 STENT CEPHALIC VEIN/ 03-23-2011 DILATATION ANGIOPLASTY)  . Left ptc left renal artery   . Cardiac catheterization 2004  . Cataract extraction w/ intraocular lens  implant, bilateral   . Cystoscopy w/ retrogrades 12/28/2011    Procedure: CYSTOSCOPY WITH RETROGRADE PYELOGRAM;  Surgeon: Andrew [name], MD;  Location: [facility];  Service: Urology;  Laterality: Left;  C-ARM   . Transurethral resection of bladder tumor 12/28/2011    Procedure: TRANSURETHRAL RESECTION OF BLADDER TUMOR (TURBT);  Surgeon: Andrew [name], MD;  Location: [facility];  Service: Urology;  Laterality: N/A;  . Av fistula placement 02/29/2012    Procedure: INSERTION OF ARTERIOVENOUS (AV) GORE-TEX GRAFT THIGH;  Surgeon: Andrew Earthly, MD;  Location: [facility] OR;  Service: Vascular;  Laterality: Right;  Insertion right femoral arteriovenous gortex graft  . Ligation of right braciocephalic av fistula 04-04-2012    Family History:  Family History  Problem Relation Age of  Onset  . Colon cancer Neg Hx   . Liver disease Neg Hx   . Anesthesia problems Neg Hx   . Heart disease Father    Social History:  reports that he quit smoking about 28 years ago. His smoking use included Cigarettes. He has a 25 pack-year smoking history. He has never used smokeless tobacco. He reports that he does not drink alcohol or use illicit drugs.  Allergies: No Known Allergies  Home medications: Prior to Admission medications   Medication Sig Start Date End Date Taking? Authorizing Provider  acetaminophen (TYLENOL) 500 MG tablet Take 1,000 mg by mouth every 6 (six) hours as needed. For pain/headache    Historical Provider,  MD  allopurinol (ZYLOPRIM) 100 MG tablet Take 100 mg by mouth 2 (two) times daily.  09/19/11   Andrew Mura, MD  aspirin EC 81 MG tablet Take 81 mg by mouth daily.    Historical Provider, MD  atorvastatin (LIPITOR) 10 MG tablet Take 10 mg by mouth daily.     Historical Provider, MD  B Complex-C-Zn-Folic Acid (DIALYVITE 800/ZINC PO) Take 1 tablet by mouth daily.     Historical Provider, MD  calcium carbonate (TUMS - DOSED IN MG ELEMENTAL CALCIUM) 500 MG chewable tablet Chew 1 tablet by mouth daily as needed. For upset stomach    Historical Provider, MD  cefadroxil (DURICEF) 500 MG capsule Take 500 mg by mouth 2 (two) times daily.    Historical Provider, MD  colchicine (COLCRYS) 0.6 MG tablet Take 0.6 mg by mouth 2 (two) times daily as needed. for gout    Historical Provider, MD  gabapentin (NEURONTIN) 100 MG capsule Take 200 mg by mouth 2 (two) times daily.  01/09/12 01/08/13  Andrew Brunswick, MD  omeprazole (PRILOSEC) 40 MG capsule Take 40 mg by mouth 2 (two) times daily. For reflux    Historical Provider, MD  polyethylene glycol powder (MIRALAX) powder Take 17 g by mouth daily.     Historical Provider, MD  sodium polystyrene (KAYEXALATE) 15 GM/60ML suspension Take 45 g by mouth once.    Historical Provider, MD    Inpatient medications:    . albuterol      . methylPREDNISolone sodium succinate        Review of Systems Gen:  Denies headache, fever, chills, sweats.  No weight loss. HEENT:  No visual change, sore throat, difficulty swallowing. Resp:  See above.  No cough or hemoptysis. Cardiac:  No chest pain, orthopnea, PND.  Denies edema. GI:   Denies abdominal pain.   No nausea, vomiting, diarrhea.  No constipation. GU:  Denies difficulty or change in voiding.  No change in urine color.     MS:  Denies joint pain or swelling.   Derm:  Denies skin rash or itching.  No chronic skin conditions.  Neuro:   Denies focal weakness, memory problems, hx stroke or TIA.   Psych:  Denies  symptoms of depression of anxiety.  No hallucination.     Labs: Basic Metabolic Panel:  Lab 05/15/12 1610 05/10/12 2011 05/09/12 1450  NA -- 138 --  K 4.4 3.6 6.2*  CL -- 95* --  CO2 -- 30 --  GLUCOSE -- 79 --  BUN -- 35* --  CREATININE -- 5.78* --  ALB -- -- --  CALCIUM -- 8.5 --  PHOS -- -- --   Liver Function Tests: No results found for this basename: AST:3,ALT:3,ALKPHOS:3,BILITOT:3,PROT:3,ALBUMIN:3 in the last 168 hours No results found for this basename: LIPASE:3,AMYLASE:3  in the last 168 hours No results found for this basename: AMMONIA:3 in the last 168 hours CBC:  Lab 05/10/12 2011  WBC 4.5  NEUTROABS 3.5  HGB 9.6*  HCT 30.1*  MCV 99.3  PLT 78*   PT/INR: @labrcntip (inr:5) Cardiac Enzymes: No results found for this basename: CKTOTAL:5,CKMB:5,CKMBINDEX:5,TROPONINI:5 in the last 168 hours CBG: No results found for this basename: GLUCAP:5 in the last 168 hours  Iron Studies: No results found for this basename: IRON:30,TIBC:30,TRANSFERRIN:30,FERRITIN:30 in the last 168 hours  Xrays/Other Studies: Dg Chest Port 1 View  05/15/2012  *RADIOLOGY REPORT*  Clinical Data: Shortness of breath.  Chest congestion.  Receiving dialysis.  PORTABLE CHEST - 1 VIEW  Comparison: Previous examinations, the most recent dated 04/21/2012.  Findings: The heart remains normal in size.  Interval right jugular catheter with its tip at the junction of the superior vena cava and right atrium.  No pneumothorax.  Stable post CABG changes.  The pulmonary vasculature and interstitial markings are slightly more prominent than on 04/21/2012 and significantly more prominent than on 04/04/2012.  A right axillary vascular stent is unchanged.  Left axillary surgical clips are again demonstrated.  Unremarkable bones.  IMPRESSION: Mild changes of congestive heart failure with mild progression.  Original Report Authenticated By: Darrol Angel, M.D.    Physical Exam:  Blood pressure 114/60, pulse 84, resp.  rate 14, weight 78.2 kg (172 lb 6.4 oz), SpO2 100.00%.  Gen: alert, having difficulty breathing, not in severe distress Skin: no rash, cyanosis Neck: no JVD, no bruits or LAN Chest: decreased air movement bilat, +exp wheeze, no sig rales Heart: regular, no rub or gallop Abdomen: soft, nontender, no mass or ascites Ext: no edema, R fem AVGG no bruit Neuro: alert, Ox3, no focal deficit Heme/Lymph: no bruising or LAN  Outpatient HD: MWF at [place]. 4 hrs  Hep 3000u. 2K/2.25 bath.  EDW 77 kg. EPO none   Zemplar 3 uq/hd. Access: R fem AVGG  Impression/Plan 1. Clotted R femoral AVGG - thrombolysis tomorrow per IR.   2. ESRD MWF at [facility]- HD tonight via temp cath placed today.   3. SOB - clinically looks more like COPD exacerbation, responding well to nebs and not tolerating much UF on dialysis. CXR suggestive of early IS edema, however , also.  Wheezing resolved after first neb Rx and IV solumedrol.  Continue IV steroids 4. Hx CAD/CABG 5. Hx bladder Ca w multiple recurrences - f/b Dr. Vonita Moss 6. HTN/volume- not on any BP meds at home.  Not grossly overloaded, 1.2kg over dry weight.  7. MBD - cont Sensipar, renvela 4ac, Zemplar 3uq with HD.  8. Anemia of CKD- Hb over goal, no EPO or IV Harvel Quale  MD [facility] [phone] pgr    [phone] cell 05/15/2012, 7:58 PM

## 2012-05-15 NOTE — Progress Notes (Signed)
Call to Marty, [place] potassium 4.4 

## 2012-05-15 NOTE — Progress Notes (Signed)
Call to March ARB, [place] potassium 4.4

## 2012-05-15 NOTE — Procedures (Signed)
I was present at this dialysis session. I have reviewed the session itself and made appropriate changes.   Vinson Moselle, MD [employer] 05/15/2012, 8:33 PM

## 2012-05-15 NOTE — Procedures (Signed)
Procedure:  Right IJ temporary dialysis catheter placement Access:  Right IJ vein Catheter:  20 cm, 13 Fr Trialysis cath Tip:  RA No PTX

## 2012-05-16 ENCOUNTER — Inpatient Hospital Stay (HOSPITAL_COMMUNITY): Payer: Medicare Other

## 2012-05-16 ENCOUNTER — Encounter (HOSPITAL_COMMUNITY): Payer: Self-pay | Admitting: *Deleted

## 2012-05-16 LAB — CBC
HCT: 30.9 % — ABNORMAL LOW (ref 39.0–52.0)
Hemoglobin: 9.9 g/dL — ABNORMAL LOW (ref 13.0–17.0)
MCH: 31.5 pg (ref 26.0–34.0)
MCHC: 32 g/dL (ref 30.0–36.0)
MCV: 98.4 fL (ref 78.0–100.0)
RDW: 15.8 % — ABNORMAL HIGH (ref 11.5–15.5)

## 2012-05-16 LAB — MRSA PCR SCREENING: MRSA by PCR: NEGATIVE

## 2012-05-16 MED ORDER — METHYLPREDNISOLONE 4 MG PO KIT
4.0000 mg | PACK | ORAL | Status: AC
  Administered 2012-05-16: 4 mg via ORAL
  Filled 2012-05-16: qty 21

## 2012-05-16 MED ORDER — DARBEPOETIN ALFA-POLYSORBATE 25 MCG/0.42ML IJ SOLN: 25.0000 ug | INTRAMUSCULAR | Status: DC

## 2012-05-16 MED ORDER — DARBEPOETIN ALFA-POLYSORBATE 25 MCG/0.42ML IJ SOLN
25.0000 ug | INTRAMUSCULAR | Status: DC
  Administered 2012-05-17: 25 ug via INTRAVENOUS
  Filled 2012-05-16: qty 0.42

## 2012-05-16 MED ORDER — METHYLPREDNISOLONE 4 MG PO KIT: 4.0000 mg | PACK | Freq: Four times a day (QID) | ORAL | Status: DC

## 2012-05-16 MED ORDER — METHYLPREDNISOLONE 4 MG PO KIT: 8.0000 mg | PACK | Freq: Every evening | ORAL | Status: DC

## 2012-05-16 MED ORDER — MIDODRINE HCL 5 MG PO TABS
10.0000 mg | ORAL_TABLET | ORAL | Status: DC
  Filled 2012-05-16: qty 2

## 2012-05-16 MED ORDER — FENTANYL CITRATE 0.05 MG/ML IJ SOLN
INTRAMUSCULAR | Status: AC | PRN
  Administered 2012-05-16 (×3): 25 ug via INTRAVENOUS

## 2012-05-16 MED ORDER — MIDAZOLAM HCL 2 MG/2ML IJ SOLN
INTRAMUSCULAR | Status: AC
  Filled 2012-05-16: qty 4

## 2012-05-16 MED ORDER — IOHEXOL 300 MG/ML  SOLN: 100.0000 mL | Freq: Once | INTRAMUSCULAR | Status: AC | PRN

## 2012-05-16 MED ORDER — MIDAZOLAM HCL 5 MG/5ML IJ SOLN
INTRAMUSCULAR | Status: AC | PRN
  Administered 2012-05-16: 1 mg via INTRAVENOUS
  Administered 2012-05-16: 0.5 mg via INTRAVENOUS

## 2012-05-16 MED ORDER — ALTEPLASE 100 MG IV SOLR
2.0000 mg | Freq: Once | INTRAVENOUS | Status: DC
  Filled 2012-05-16: qty 2

## 2012-05-16 MED ORDER — SODIUM CHLORIDE 0.9 % IV SOLN
INTRAVENOUS | Status: AC | PRN
  Administered 2012-05-16: 20 mL/h via INTRAVENOUS

## 2012-05-16 MED ORDER — ALTEPLASE 100 MG IV SOLR
INTRAVENOUS | Status: AC | PRN
  Administered 2012-05-16: 4 mg

## 2012-05-16 MED ORDER — HEPARIN SODIUM (PORCINE) 1000 UNIT/ML IJ SOLN
INTRAMUSCULAR | Status: AC | PRN
  Administered 2012-05-16 (×2): 3000 [IU] via INTRAVENOUS

## 2012-05-16 MED ORDER — MIDODRINE HCL 10 MG PO TABS: 10.0000 mg | ORAL_TABLET | ORAL | Status: AC

## 2012-05-16 MED ORDER — FENTANYL CITRATE 0.05 MG/ML IJ SOLN
INTRAMUSCULAR | Status: AC
  Filled 2012-05-16: qty 4

## 2012-05-16 MED ORDER — METHYLPREDNISOLONE 4 MG PO KIT: 4.0000 mg | PACK | Freq: Three times a day (TID) | ORAL | Status: DC

## 2012-05-16 MED ORDER — METHYLPREDNISOLONE 4 MG PO KIT: 12.0000 mg | PACK | ORAL | Status: DC

## 2012-05-16 MED ORDER — METHYLPREDNISOLONE 4 MG PO KIT: 8.0000 mg | PACK | Freq: Every morning | ORAL | Status: DC

## 2012-05-16 MED ORDER — METHYLPREDNISOLONE 4 MG PO KIT
8.0000 mg | PACK | Freq: Every evening | ORAL | Status: AC
  Administered 2012-05-16: 8 mg via ORAL
  Filled 2012-05-16: qty 21

## 2012-05-16 NOTE — Discharge Summary (Signed)
Physician Discharge Summary  Patient ID: Andrew [name] MRN: [id number] DOB/AGE: 1926-06-18 78 y.o.  Admit date: 05/15/2012 Discharge date: 05/16/2012  Admission Diagnoses: Volume overload; Clotted HD Access  Discharge Diagnoses:  Principal Problem:  *Dyspnea Active Problems:  SECONDARY HYPERPARATHYROIDISM  ANEMIA  Hypertension  ESRD (end stage renal disease)  Clotted dialysis access   Discharged Condition: stable  Hospital Course: 78 yo WM with hx of ESRD, bladder Ca, CAD with hx CABG who presented to the outpt HD unit yesterday in [place] dyspneic with a clotted AVG of R thigh. CXR on arrival revealed increasing interstitial edema. He underwent placement of a temporary HD cath and his condition improved following removal of fluid during HD last night. CXR following revealed improvement in aeration and following successful thrombolysis in IR today and removal of his temporary dialysis catheter, he will be discharged to return to the outpatient HD unit with a decreased in his eDW. We will also add Midodrine 10 mg to be taken prior to each HD treatment to facilitate fluid removal and prevent further issues of volume overload.     Consults: Interventional Radiology  Significant Diagnostic Studies: radiology: CXR admission (05/15/12): Mild changes of congestive heart failure with mild progression and repeat CXR 6/20/13Improvement in aeration without convincing pulmonary edema. Stable  right IJ catheter. Status post CABG. No focal infiltrate    Treatments: dialysis: Hemodialysis; IR: temporary dialysis catheter placement and removal; declot right thigh AVG   Discharge Exam: Blood pressure 139/66, pulse 83, temperature 97.6 F (36.4 C), temperature source Oral, resp. rate 18, height 5\' 8"  (1.727 m), weight 76.6 kg (168 lb 14 oz), SpO2 95.00%.   Disposition: 01-Home or Self Care  Discharge Orders    Future Appointments: Provider: Department: Dept Phone: Center:   08/20/2012 11:30 AM  Kathlen Brunswick, MD [facility] [phone] [facility]     Medication List  As of 05/16/2012  2:55 PM   ASK your doctor about these medications         acetaminophen 500 MG tablet   Commonly known as: TYLENOL   Take 1,000 mg by mouth every 6 (six) hours as needed. For pain/headache      allopurinol 100 MG tablet   Commonly known as: ZYLOPRIM   Take 200 mg by mouth daily.      aspirin EC 81 MG tablet   Take 81 mg by mouth daily.      atorvastatin 10 MG tablet   Commonly known as: LIPITOR   Take 10 mg by mouth daily.      calcium carbonate 500 MG chewable tablet   Commonly known as: TUMS - dosed in mg elemental calcium   Chew 1 tablet by mouth daily as needed. For upset stomach      cinacalcet 30 MG tablet   Commonly known as: SENSIPAR   Take 30 mg by mouth daily.      COLCRYS 0.6 MG tablet   Generic drug: colchicine   Take 0.6 mg by mouth 2 (two) times daily as needed. for gout      DIALYVITE 800/ZINC PO   Take 1 tablet by mouth daily.      gabapentin 100 MG capsule   Commonly known as: NEURONTIN   Take 100 mg by mouth 2 (two) times daily.      MIRALAX powder   Generic drug: polyethylene glycol powder   Take 17 g by mouth daily.      omeprazole 40 MG capsule   Commonly known as: PRILOSEC  Take 40 mg by mouth at bedtime. For reflux             Signed: Lizbeth Bark 05/16/2012, 2:55 PM

## 2012-05-16 NOTE — ED Notes (Signed)
Report given to Amy RN.

## 2012-05-16 NOTE — Procedures (Signed)
Technically successful declot of right thigh graft.  No immediate post procedural complications. 

## 2012-05-16 NOTE — Discharge Instructions (Signed)
Keep regular scheduled outpatient HD treatment regimen with next treatment on Friday 05/17/12

## 2012-05-16 NOTE — Progress Notes (Signed)
Subjective:  Feeling much better after HD last night; still thinks fluid associated resp discomfort and not possible COPD; currently NPO and scheduled for declot IR today  Vital signs in last 24 hours: Filed Vitals:   05/16/12 0239 05/16/12 0545 05/16/12 0814 05/16/12 0900  BP:  143/71  143/77  Pulse:  67  84  Temp:  97.8 F (36.6 C)  97.9 F (36.6 C)  TempSrc:  Oral  Oral  Resp:  18  17  Height:      Weight:      SpO2: 99% 98% 96% 99%   Weight change:   Intake/Output Summary (Last 24 hours) at 05/16/12 1014 Last data filed at 05/16/12 0901  Gross per 24 hour  Intake      0 ml  Output   3000 ml  Net  -3000 ml   Labs: Basic Metabolic Panel:  Lab 05/15/12 1610 05/15/12 1441 05/10/12 2011  NA 137 -- 138  K 4.5 4.4 3.6  CL 95* -- 95*  CO2 30 -- 30  GLUCOSE 90 -- 79  BUN 65* -- 35*  CREATININE 9.14* -- 5.78*  CALCIUM 8.8 -- 8.5  ALB -- -- --  PHOS 4.9* -- --   Liver Function Tests:  Lab 05/15/12 1814  AST --  ALT --  ALKPHOS --  BILITOT --  PROT --  ALBUMIN 3.5   No results found for this basename: LIPASE:3,AMYLASE:3 in the last 168 hours No results found for this basename: AMMONIA:3 in the last 168 hours CBC:  Lab 05/16/12 0904 05/15/12 2310 05/10/12 2011  WBC 2.5* 3.0* 4.5  NEUTROABS -- -- 3.5  HGB 9.9* 10.1* 9.6*  HCT 30.9* 31.7* 30.1*  MCV 98.4 99.4 99.3  PLT 86* 74* 78*   Cardiac Enzymes: No results found for this basename: CKTOTAL:5,CKMB:5,CKMBINDEX:5,TROPONINI:5 in the last 168 hours CBG: No results found for this basename: GLUCAP:5 in the last 168 hours  Iron Studies: No results found for this basename: IRON,TIBC,TRANSFERRIN,FERRITIN in the last 72 hours Studies/Results: Ir Fluoro Guide Cv Line Right  05/15/2012  *RADIOLOGY REPORT*  Clinical Data: Renal failure and occluded dialysis graft.  The patient requires emergent placement of a temporary dialysis catheter for immediate dialysis needs due to pulmonary edema and significant volume  overload.  NON-TUNNELED CENTRAL VENOUS CATHETER PLACEMENT WITH ULTRASOUND AND FLUOROSCOPIC GUIDANCE  Fluoroscopy Time:  0.6 minutes.  Procedure:  The procedure, risks, benefits, and alternatives were explained to the patient.  Questions regarding the procedure were encouraged and answered.  The patient understands and consents to the procedure.  The right neck and chest were prepped with chlorhexidine in a sterile fashion, and a sterile drape was applied covering the operative field.  Maximum barrier sterile technique with sterile gowns and gloves were used for the procedure.  Local anesthesia was provided with 1% lidocaine.  After creating a small venotomy incision, a 21 gauge needle was advanced into the right internal jugular vein under direct, real- time ultrasound guidance.  Ultrasound image documentation was performed.  After securing guidewire access, the venotomy was dilated.  A 20 cm length, 13-French Trialysis non tunneled catheter was placed over a wire.  Final catheter positioning was confirmed and documented with a fluoroscopic spot image.  The catheter was aspirated and flushed with saline.  The catheter exit site was secured with 0-Prolene retention sutures.  Complications: None.  No pneumothorax.  Findings:  After catheter placement, the tip lies in the upper right atrium.  The catheter aspirates normally and  is ready for immediate use.  IMPRESSION:  Placement of non-tunneled temporary dialysis catheter via the right internal jugular vein.  The catheter tip lies in the upper right atrium.  The catheter is ready for immediate use.  Original Report Authenticated By: Reola Calkins, M.D.   Ir US Guide Vasc Access Right  05/15/2012  *RADIOLOGY REPORT*  Clinical Data: Renal failure and occluded dialysis graft.  The patient requires emergent placement of a temporary dialysis catheter for immediate dialysis needs due to pulmonary edema and significant volume overload.  NON-TUNNELED CENTRAL VENOUS  CATHETER PLACEMENT WITH ULTRASOUND AND FLUOROSCOPIC GUIDANCE  Fluoroscopy Time:  0.6 minutes.  Procedure:  The procedure, risks, benefits, and alternatives were explained to the patient.  Questions regarding the procedure were encouraged and answered.  The patient understands and consents to the procedure.  The right neck and chest were prepped with chlorhexidine in a sterile fashion, and a sterile drape was applied covering the operative field.  Maximum barrier sterile technique with sterile gowns and gloves were used for the procedure.  Local anesthesia was provided with 1% lidocaine.  After creating a small venotomy incision, a 21 gauge needle was advanced into the right internal jugular vein under direct, real- time ultrasound guidance.  Ultrasound image documentation was performed.  After securing guidewire access, the venotomy was dilated.  A 20 cm length, 13-French Trialysis non tunneled catheter was placed over a wire.  Final catheter positioning was confirmed and documented with a fluoroscopic spot image.  The catheter was aspirated and flushed with saline.  The catheter exit site was secured with 0-Prolene retention sutures.  Complications: None.  No pneumothorax.  Findings:  After catheter placement, the tip lies in the upper right atrium.  The catheter aspirates normally and is ready for immediate use.  IMPRESSION:  Placement of non-tunneled temporary dialysis catheter via the right internal jugular vein.  The catheter tip lies in the upper right atrium.  The catheter is ready for immediate use.  Original Report Authenticated By: Reola Calkins, M.D.   Dg Chest Port 1 View  05/15/2012  *RADIOLOGY REPORT*  Clinical Data: Shortness of breath.  Chest congestion.  Receiving dialysis.  PORTABLE CHEST - 1 VIEW  Comparison: Previous examinations, the most recent dated 04/21/2012.  Findings: The heart remains normal in size.  Interval right jugular catheter with its tip at the junction of the superior vena  cava and right atrium.  No pneumothorax.  Stable post CABG changes.  The pulmonary vasculature and interstitial markings are slightly more prominent than on 04/21/2012 and significantly more prominent than on 04/04/2012.  A right axillary vascular stent is unchanged.  Left axillary surgical clips are again demonstrated.  Unremarkable bones.  IMPRESSION: Mild changes of congestive heart failure with mild progression.  Original Report Authenticated By: Darrol Angel, M.D.   Medications:      . albuterol  2.5 mg Nebulization Q4H  . allopurinol  200 mg Oral Daily  . aspirin EC  81 mg Oral Daily  . atorvastatin  10 mg Oral Daily  . gabapentin  100 mg Oral BID  . heparin  5,000 Units Subcutaneous Q8H  . methylPREDNISolone sodium succinate      . methylPREDNISolone (SOLU-MEDROL) injection  40 mg Intravenous Q6H  . multivitamin  1 tablet Oral QHS  . pantoprazole  80 mg Oral Q1200  . polyethylene glycol powder  17 g Oral Daily  . sodium chloride  3 mL Intravenous Q12H  . sodium chloride  3 mL Intravenous Q12H  . DISCONTD: DIALYVITE 800 WITH ZINC  1 tablet Oral q morning - 10a    I  have reviewed scheduled and prn medications.  Physical Exam: General: comfortable Heart: RRR Lungs: diminished bases; no adventitious B.S. this am Abdomen: soft, NT, ND, +BS Extremities: no ankle edema Dialysis Access: clotted right thigh AVG; right subclavian temporary dialysis cath  Problem/Plan: 1. Clotted R femoral AVGG - scheduled for thrombolysis today per IR.  2. ESRD MWF at [facility]- HD last night. Standing weight is 76.2 kg this am.  We should lower his EDW back to 76 kg, since he was wet at 80.6kg yesterday at the center.  3. SOB - doubt COPD, CXR findings suggested fluid.  He says the nebulizers didn't do anything.  Will stop steroids and nebs.  Plan d/c home later today if his thigh graft can be opened up by IR.   4. HTN/volume/history CAD s/p CABG-- UF with HD for control; adjust eDW at time of  discharge.  5. MBD - phos 4.9, ca 8.8; cont Sensipar, renvela 4ac, Zemplar 3uq with HD; follow.  6. Anemia of CKD- Hgb 9.9; no EPO on adm; resume low dose ESA with next HD treatment   7. Thrombocytopenia, mild- plt ^86; no heparin with HD and follow 8. Nutrition- albumin 3.5; ^ protein diet when taking po and follow 9. Disposition- declot today; depending on CXR results; discharge after declot or after HD first run in am   Andrew Germany, FNP-C [facility] Pager [phone]  05/16/2012,10:14 AM  LOS: 1 day   Patient seen and examined and agree with assessment and plan as above with additions as indicated.  Vinson Moselle  MD [facility] [phone] pgr    [phone] cell 05/16/2012, 11:08 AM

## 2012-05-16 NOTE — H&P (Signed)
Andrew [name] is an 78 y.o. male.   Chief Complaint: clotted rt thigh graft last declotted 05/09/12 New Rt IJ temp cath placed last pm - 6/19 Scheduled now for rt thigh graft thrombolysis and possible angioplasty/stent HPI: ESRD; CVA; Bladder ca; renal cell ca;HTN; CHF; CAD  Past Medical History  Diagnosis Date  . Secondary hyperparathyroidism   . Peripheral vascular disease   . Hyperlipidemia   . Duodenal ulcer     remote  . Gout STABLE  . ESRD (end stage renal disease) NEPHROLOGIST-   DR [name]    Hemodialysis; Hx of bleeding from aneurysms on AVF  . Arteriosclerotic cardiovascular disease (ASCVD) 1998    CABG-1998; h/o CHF  . Cerebrovascular disease REMOTE --  PER CHART--    NO RESIDUAL  . Neuropathy of foot COLD FEET  . Degenerative joint disease   . Gastroesophageal reflux disease   . History of bladder cancer   . Impaired hearing BILATERAL     ONLY WEARS LEFT HEARING AID  . Blood transfusion   . Anemia   . Renal cell carcinoma 2001    s/p right nephrectomy-2001; subsequent ESRD  . Hypertension   . CHF (congestive heart failure)   . Renal insufficiency   . Coronary artery disease     Past Surgical History  Procedure Date  . Right nephrectomy 2001    Renal cell carcinoma  . Colonoscopy 01/24/2011    prominent vascular pattern, suboptimal prep but doable  . Esophagogastroduodenoscopy 01/24/2011    mild erosive reflux esophagitis, bulbar/antral erosions, bx from antrum benign  . Av fistula repair 2008    revision of anastomosis of Right AVF  . Arteriovenous graft placement 2006    Thrombectomy and interposition jump graft revision to higher axillary vein of LUA AVG  . Coronary artery bypass graft 1998    X4 VESSELS  . Multiple cysto/ resection tumor's with bx's LAST ONE 05-09-2011  . Multiple surg's / interventions for avgg/ fistula right upper arm LAST REVISION 06-29-2011    (JUNE 2010 STENT CEPHALIC VEIN/ 03-23-2011 DILATATION ANGIOPLASTY)  . Left ptc left renal  artery   . Cardiac catheterization 2004  . Cataract extraction w/ intraocular lens  implant, bilateral   . Cystoscopy w/ retrogrades 12/28/2011    Procedure: CYSTOSCOPY WITH RETROGRADE PYELOGRAM;  Surgeon: [name], MD;  Location: [facility];  Service: Urology;  Laterality: Left;  C-ARM   . Transurethral resection of bladder tumor 12/28/2011    Procedure: TRANSURETHRAL RESECTION OF BLADDER TUMOR (TURBT);  Surgeon: [name], MD;  Location: [facility];  Service: Urology;  Laterality: N/A;  . Av fistula placement 02/29/2012    Procedure: INSERTION OF ARTERIOVENOUS (AV) GORE-TEX GRAFT THIGH;  Surgeon: [name], MD;  Location: [facility] OR;  Service: Vascular;  Laterality: Right;  Insertion right femoral arteriovenous gortex graft  . Ligation of right braciocephalic av fistula 04-04-2012    Family History  Problem Relation Age of Onset  . Colon cancer Neg Hx   . Liver disease Neg Hx   . Anesthesia problems Neg Hx   . Heart disease Father    Social History:  reports that he quit smoking about 28 years ago. His smoking use included Cigarettes. He has a 25 pack-year smoking history. He has never used smokeless tobacco. He reports that he does not drink alcohol or use illicit drugs.  Allergies: No Known Allergies  Medications Prior to Admission  Medication Sig Dispense Refill  .  acetaminophen (TYLENOL) 500 MG tablet Take 1,000 mg by mouth every 6 (six) hours as needed. For pain/headache      . allopurinol (ZYLOPRIM) 100 MG tablet Take 200 mg by mouth daily.       Marland Kitchen aspirin EC 81 MG tablet Take 81 mg by mouth daily.      Marland Kitchen atorvastatin (LIPITOR) 10 MG tablet Take 10 mg by mouth daily.       . B Complex-C-Zn-Folic Acid (DIALYVITE 800/ZINC PO) Take 1 tablet by mouth daily.       . calcium carbonate (TUMS - DOSED IN MG ELEMENTAL CALCIUM) 500 MG chewable tablet Chew 1 tablet by mouth daily as needed. For upset stomach      . cinacalcet (SENSIPAR) 30 MG tablet Take  30 mg by mouth daily.      . colchicine (COLCRYS) 0.6 MG tablet Take 0.6 mg by mouth 2 (two) times daily as needed. for gout      . gabapentin (NEURONTIN) 100 MG capsule Take 100 mg by mouth 2 (two) times daily.       Marland Kitchen omeprazole (PRILOSEC) 40 MG capsule Take 40 mg by mouth at bedtime. For reflux      . polyethylene glycol powder (MIRALAX) powder Take 17 g by mouth daily.         Results for orders placed during the hospital encounter of 05/15/12 (from the past 48 hour(s))  MRSA PCR SCREENING     Status: Normal   Collection Time   05/15/12 10:46 PM      Component Value Range Comment   MRSA by PCR NEGATIVE  NEGATIVE   CBC     Status: Abnormal   Collection Time   05/15/12 11:10 PM      Component Value Range Comment   WBC 3.0 (*) 4.0 - 10.5 K/uL    RBC 3.19 (*) 4.22 - 5.81 MIL/uL    Hemoglobin 10.1 (*) 13.0 - 17.0 g/dL    HCT 56.2 (*) 13.0 - 52.0 %    MCV 99.4  78.0 - 100.0 fL    MCH 31.7  26.0 - 34.0 pg    MCHC 31.9  30.0 - 36.0 g/dL    RDW 86.5 (*) 78.4 - 15.5 %    Platelets 74 (*) 150 - 400 K/uL CONSISTENT WITH PREVIOUS RESULT   Ir Fluoro Guide Cv Line Right  05/15/2012  *RADIOLOGY REPORT*  Clinical Data: Renal failure and occluded dialysis graft.  The patient requires emergent placement of a temporary dialysis catheter for immediate dialysis needs due to pulmonary edema and significant volume overload.  NON-TUNNELED CENTRAL VENOUS CATHETER PLACEMENT WITH ULTRASOUND AND FLUOROSCOPIC GUIDANCE  Fluoroscopy Time:  0.6 minutes.  Procedure:  The procedure, risks, benefits, and alternatives were explained to the patient.  Questions regarding the procedure were encouraged and answered.  The patient understands and consents to the procedure.  The right neck and chest were prepped with chlorhexidine in a sterile fashion, and a sterile drape was applied covering the operative field.  Maximum barrier sterile technique with sterile gowns and gloves were used for the procedure.  Local anesthesia was  provided with 1% lidocaine.  After creating a small venotomy incision, a 21 gauge needle was advanced into the right internal jugular vein under direct, real- time ultrasound guidance.  Ultrasound image documentation was performed.  After securing guidewire access, the venotomy was dilated.  A 20 cm length, 13-French Trialysis non tunneled catheter was placed over a wire.  Final catheter positioning  was confirmed and documented with a fluoroscopic spot image.  The catheter was aspirated and flushed with saline.  The catheter exit site was secured with 0-Prolene retention sutures.  Complications: None.  No pneumothorax.  Findings:  After catheter placement, the tip lies in the upper right atrium.  The catheter aspirates normally and is ready for immediate use.  IMPRESSION:  Placement of non-tunneled temporary dialysis catheter via the right internal jugular vein.  The catheter tip lies in the upper right atrium.  The catheter is ready for immediate use.  Original Report Authenticated By: Reola Calkins, M.D.   Ir US Guide Vasc Access Right  05/15/2012  *RADIOLOGY REPORT*  Clinical Data: Renal failure and occluded dialysis graft.  The patient requires emergent placement of a temporary dialysis catheter for immediate dialysis needs due to pulmonary edema and significant volume overload.  NON-TUNNELED CENTRAL VENOUS CATHETER PLACEMENT WITH ULTRASOUND AND FLUOROSCOPIC GUIDANCE  Fluoroscopy Time:  0.6 minutes.  Procedure:  The procedure, risks, benefits, and alternatives were explained to the patient.  Questions regarding the procedure were encouraged and answered.  The patient understands and consents to the procedure.  The right neck and chest were prepped with chlorhexidine in a sterile fashion, and a sterile drape was applied covering the operative field.  Maximum barrier sterile technique with sterile gowns and gloves were used for the procedure.  Local anesthesia was provided with 1% lidocaine.  After creating  a small venotomy incision, a 21 gauge needle was advanced into the right internal jugular vein under direct, real- time ultrasound guidance.  Ultrasound image documentation was performed.  After securing guidewire access, the venotomy was dilated.  A 20 cm length, 13-French Trialysis non tunneled catheter was placed over a wire.  Final catheter positioning was confirmed and documented with a fluoroscopic spot image.  The catheter was aspirated and flushed with saline.  The catheter exit site was secured with 0-Prolene retention sutures.  Complications: None.  No pneumothorax.  Findings:  After catheter placement, the tip lies in the upper right atrium.  The catheter aspirates normally and is ready for immediate use.  IMPRESSION:  Placement of non-tunneled temporary dialysis catheter via the right internal jugular vein.  The catheter tip lies in the upper right atrium.  The catheter is ready for immediate use.  Original Report Authenticated By: Reola Calkins, M.D.   Dg Chest Port 1 View  05/15/2012  *RADIOLOGY REPORT*  Clinical Data: Shortness of breath.  Chest congestion.  Receiving dialysis.  PORTABLE CHEST - 1 VIEW  Comparison: Previous examinations, the most recent dated 04/21/2012.  Findings: The heart remains normal in size.  Interval right jugular catheter with its tip at the junction of the superior vena cava and right atrium.  No pneumothorax.  Stable post CABG changes.  The pulmonary vasculature and interstitial markings are slightly more prominent than on 04/21/2012 and significantly more prominent than on 04/04/2012.  A right axillary vascular stent is unchanged.  Left axillary surgical clips are again demonstrated.  Unremarkable bones.  IMPRESSION: Mild changes of congestive heart failure with mild progression.  Original Report Authenticated By: Darrol Angel, M.D.    Review of Systems  Constitutional: Negative for fever and chills.  Respiratory: Positive for shortness of breath. Negative  for cough.   Gastrointestinal: Negative for nausea and vomiting.  Neurological: Negative for dizziness and headaches.    Blood pressure 143/71, pulse 67, temperature 97.8 F (36.6 C), temperature source Oral, resp. rate 18, height 5\' 8"  (  1.727 m), weight 168 lb 14 oz (76.6 kg), SpO2 98.00%. Physical Exam  Constitutional: He is oriented to person, place, and time.  Cardiovascular: Normal rate, regular rhythm and normal heart sounds.   Respiratory: Effort normal. He has wheezes.  GI: Soft. Bowel sounds are normal. There is no tenderness.  Musculoskeletal: Normal range of motion.       Clotted rt thigh graft  Neurological: He is alert and oriented to person, place, and time.  Psychiatric: He has a normal mood and affect. His behavior is normal. Judgment and thought content normal.     Assessment/Plan Rt thigh graft clotted Temp cath placed last pm- Rt IJ Last declot on rt thigh 6/13 in IR scheduled now for rt thigh graft thrombolysis and poss pta/stent Consent in chart plts 74 6/19: check cbc  Kady Toothaker A 05/16/2012, 8:06 AM

## 2012-05-16 NOTE — Progress Notes (Signed)
Pt returned from radiology alert and oriented x 3 daughter at bedside , daughter concerned pt gets confused from  Procedures and got confused last week at dialysis after same procedure day before

## 2012-05-16 NOTE — ED Notes (Signed)
Dr Grace Isaac aware frequent PVC and PAC

## 2012-05-17 ENCOUNTER — Inpatient Hospital Stay (HOSPITAL_COMMUNITY): Payer: Medicare Other

## 2012-05-17 LAB — CBC
HCT: 27.8 % — ABNORMAL LOW (ref 39.0–52.0)
Hemoglobin: 8.9 g/dL — ABNORMAL LOW (ref 13.0–17.0)
RBC: 2.81 MIL/uL — ABNORMAL LOW (ref 4.22–5.81)
WBC: 6.1 10*3/uL (ref 4.0–10.5)

## 2012-05-17 LAB — RENAL FUNCTION PANEL
BUN: 64 mg/dL — ABNORMAL HIGH (ref 6–23)
CO2: 26 mEq/L (ref 19–32)
Chloride: 96 mEq/L (ref 96–112)
GFR calc Af Amer: 6 mL/min — ABNORMAL LOW (ref >90)
Glucose, Bld: 113 mg/dL — ABNORMAL HIGH (ref 70–99)
Potassium: 5.1 mEq/L (ref 3.5–5.1)

## 2012-05-17 MED ORDER — DARBEPOETIN ALFA-POLYSORBATE 25 MCG/0.42ML IJ SOLN
INTRAMUSCULAR | Status: AC
  Administered 2012-05-17: 25 ug via INTRAVENOUS
  Filled 2012-05-17: qty 0.42

## 2012-05-17 MED ORDER — POLYETHYLENE GLYCOL 3350 17 G PO PACK
17.0000 g | PACK | Freq: Every day | ORAL | Status: DC
Start: 2012-05-17 — End: 2012-05-17
  Administered 2012-05-17: 17 g via ORAL
  Filled 2012-05-17: qty 1

## 2012-05-17 MED FILL — Polyethylene Glycol 3350 Oral Packet 17 GM: ORAL | Qty: 1 | Status: AC

## 2012-05-17 NOTE — Progress Notes (Signed)
Patient ID: Andrew [name], male   DOB: 01-07-1926, 78 y.o.   MRN: [id number]  Right IJ non-tunneled temporary dialysis catheter placed 6/19 Right thigh graft declot 6/20 Successful dialysis via thigh graft 6/20  Right IJ temporary dialysis catheter removal 6/21 w/o complication  Under sterile technique - catheter was removed using slight trendelenburg positioning; sterile vaseline gauze and 4x4 while pt held breath. Applied steady pressure to removal site x 10 minutes; hemostasis obtained Sterile dressing applied  Pt tolerated well Sent back to room bedrest x 3 hrs

## 2012-05-17 NOTE — Progress Notes (Signed)
[facility] Progress Note  Subjective:  Denies sob.  Objective Filed Vitals:   05/17/12 0630 05/17/12 0701 05/17/12 0732 05/17/12 0802  BP: 134/71 124/43 123/55 117/49  Pulse: 79 78 80 79  Temp:      TempSrc:      Resp:  14 13 16   Height:      Weight:      SpO2:       Physical Exam General: Comfortable on HD Heart:RRR no rub, occ ectopy Lungs:no wheezes or rales Abdomen: soft NT Extremities:no LE edema Dialysis Access: right fem Graft Qb 400; some bruising noted (temporary right I-J in place.  Assessment/Plan: 1. S/o declot R femoral AVGG last yesterday- PTA of venous anastomosis and most of venous limb.  Amenable to future intervention per Dr. Grace Isaac.; remove temp cath before d/c 2. ESRD MWF at [place]- K 5.1 weight is 77.6 pre HD. Decrease EDW to 76 or less kg at d/c  3. SOB - doubt COPD, CXR findings suggested fluid. He says the nebulizers didn't do anything. D/c steroids; check O2 sats on room air post HD 4. HTN/volume/history CAD s/p CABG-- UF with HD for control; adjust eDW at time of discharge; midodrine added to support BP  5. MBD - phos 4.9, ca 8.8; cont Sensipar, renvela 4ac, Zemplar 3uq with HD; follow.  6. Anemia of CKD- Hgb down to 8.9; no EPO on adm; resume low dose ESA with next HD treatment  - will notify outpt center of Hgb drop at d/c 7. Thrombocytopenia, mild - platelets up to 108K; no heparin with HD and follow 8. Nutrition- albumin 3.5; ^ protein diet when taking po and follow 9. Disposition-  discharge post HD today  Sheffield Slider, PA-C [facility] [phone]  05/17/2012,8:30 AM  LOS: 2 days   Patient seen and examined and agree with assessment and plan as above. Ready for discharge after HD today.  We are trying to challenge his dry weight.  He came in with pulmonary edema on CXR and mild-/mod respiratory distress at 5kg over his dry weight.  He responded well to initial HD with f/u CXR showing clearing of edema.   Plan discharge after HD today, possible lowering of dry weight.  Added midodrine preHD for BP support.  Vinson Moselle  MD [facility] [phone] pgr    [phone] cell 05/17/2012, 9:37 AM    Additional Objective Labs: Basic Metabolic Panel:  Lab 05/17/12 2130 05/15/12 1814 05/15/12 1441 05/10/12 2011  NA 136 137 -- 138  K 5.1 4.5 4.4 --  CL 96 95* -- 95*  CO2 26 30 -- 30  GLUCOSE 113* 90 -- 79  BUN 64* 65* -- 35*  CREATININE 8.01* 9.14* -- 5.78*  CALCIUM 9.6 8.8 -- 8.5  ALB -- -- -- --  PHOS 5.1* 4.9* -- --   Liver Function Tests:  Lab 05/17/12 0643 05/15/12 1814  AST -- --  ALT -- --  ALKPHOS -- --  BILITOT -- --  PROT -- --  ALBUMIN 3.5 3.5  CBC:  Lab 05/17/12 0643 05/16/12 0904 05/15/12 2310 05/10/12 2011  WBC 6.1 2.5* 3.0* --  NEUTROABS -- -- -- 3.5  HGB 8.9* 9.9* 10.1* --  HCT 27.8* 30.9* 31.7* --  MCV 98.9 98.4 99.4 99.3  PLT 108* 86* 74* --   Medications:      . albuterol  2.5 mg Nebulization Q4H  . allopurinol  200 mg Oral Daily  . alteplase  2  mg Intracatheter Once  . alteplase  2 mg Intracatheter Once  . aspirin EC  81 mg Oral Daily  . atorvastatin  10 mg Oral Daily  . darbepoetin (ARANESP) injection - DIALYSIS  25 mcg Intravenous Q Fri-HD  . fentaNYL      . gabapentin  100 mg Oral BID  . heparin  5,000 Units Subcutaneous Q8H  . methylPREDNISolone  12 mg Oral PC lunch  . methylPREDNISolone  4 mg Oral PC supper  . methylPREDNISolone  4 mg Oral 3 x daily with food  . methylPREDNISolone  4 mg Oral 4X daily taper  . methylPREDNISolone  8 mg Oral Nightly  . methylPREDNISolone  8 mg Oral Nightly  . methylPREDNISolone (SOLU-MEDROL) injection  40 mg Intravenous Q6H  . midazolam      . midodrine  10 mg Oral Q M,W,F-HD  . multivitamin  1 tablet Oral QHS  . pantoprazole  80 mg Oral Q1200  . polyethylene glycol powder  17 g Oral Daily  . sodium chloride  3 mL Intravenous Q12H  . DISCONTD: methylPREDNISolone  8 mg Oral AC breakfast  .  DISCONTD: sodium chloride  3 mL Intravenous Q12H    I  have reviewed scheduled and prn medications.

## 2012-05-17 NOTE — Procedures (Signed)
I was present at this dialysis session. I have reviewed the session itself and made appropriate changes.   Vinson Moselle, MD [employer] 05/17/2012, 9:41 AM

## 2012-05-17 NOTE — Progress Notes (Signed)
Discussed discharge instructions with pt and daughter. Pt showed no barriers to discharge. Assessment unchanged from morning. IV removed. Right chest dressing dry and intact. Instructed pt to keep on until tomorrow. Pt discharged to home with daughter.

## 2012-05-20 MED FILL — Chlorhexidine Gluconate Liquid 4%: CUTANEOUS | Qty: 30 | Status: AC

## 2012-05-31 ENCOUNTER — Other Ambulatory Visit (HOSPITAL_COMMUNITY): Payer: Self-pay | Admitting: Internal Medicine

## 2012-05-31 DIAGNOSIS — N186 End stage renal disease: Secondary | ICD-10-CM

## 2012-06-01 ENCOUNTER — Ambulatory Visit (HOSPITAL_COMMUNITY)
Admission: RE | Admit: 2012-06-01 | Discharge: 2012-06-01 | Disposition: A | Discharge status: Home / Self Care | Payer: Medicare Other | Source: Ambulatory Visit | Attending: Internal Medicine | Admitting: Internal Medicine

## 2012-06-01 ENCOUNTER — Other Ambulatory Visit (HOSPITAL_COMMUNITY): Payer: Self-pay | Admitting: Internal Medicine

## 2012-06-01 DIAGNOSIS — I739 Peripheral vascular disease, unspecified: Secondary | ICD-10-CM | POA: Insufficient documentation

## 2012-06-01 DIAGNOSIS — N186 End stage renal disease: Secondary | ICD-10-CM | POA: Insufficient documentation

## 2012-06-01 DIAGNOSIS — Z905 Acquired absence of kidney: Secondary | ICD-10-CM | POA: Insufficient documentation

## 2012-06-01 DIAGNOSIS — T82898A Other specified complication of vascular prosthetic devices, implants and grafts, initial encounter: Secondary | ICD-10-CM | POA: Insufficient documentation

## 2012-06-01 DIAGNOSIS — E785 Hyperlipidemia, unspecified: Secondary | ICD-10-CM | POA: Insufficient documentation

## 2012-06-01 DIAGNOSIS — I2510 Atherosclerotic heart disease of native coronary artery without angina pectoris: Secondary | ICD-10-CM | POA: Insufficient documentation

## 2012-06-01 DIAGNOSIS — I120 Hypertensive chronic kidney disease with stage 5 chronic kidney disease or end stage renal disease: Secondary | ICD-10-CM | POA: Insufficient documentation

## 2012-06-01 DIAGNOSIS — Z992 Dependence on renal dialysis: Secondary | ICD-10-CM | POA: Insufficient documentation

## 2012-06-01 DIAGNOSIS — N2581 Secondary hyperparathyroidism of renal origin: Secondary | ICD-10-CM | POA: Insufficient documentation

## 2012-06-01 DIAGNOSIS — Y849 Medical procedure, unspecified as the cause of abnormal reaction of the patient, or of later complication, without mention of misadventure at the time of the procedure: Secondary | ICD-10-CM | POA: Insufficient documentation

## 2012-06-01 MED ORDER — FENTANYL CITRATE 0.05 MG/ML IJ SOLN
INTRAMUSCULAR | Status: AC | PRN
  Administered 2012-06-01: 50 ug via INTRAVENOUS

## 2012-06-01 MED ORDER — ALTEPLASE 100 MG IV SOLR
2.0000 mg | Freq: Once | INTRAVENOUS | Status: AC
  Administered 2012-06-01: 2 mg
  Filled 2012-06-01: qty 2

## 2012-06-01 MED ORDER — IOHEXOL 300 MG/ML  SOLN
100.0000 mL | Freq: Once | INTRAMUSCULAR | Status: AC | PRN
  Administered 2012-06-01: 60 mL via INTRAVENOUS

## 2012-06-01 MED ORDER — ALTEPLASE 100 MG IV SOLR
2.0000 mg | Freq: Once | INTRAVENOUS | Status: AC
  Administered 2012-06-01: 2 mg
  Filled 2012-06-01 (×2): qty 2

## 2012-06-01 MED ORDER — HEPARIN SODIUM (PORCINE) 1000 UNIT/ML IJ SOLN
INTRAMUSCULAR | Status: AC
  Administered 2012-06-01: 3000 [IU]
  Filled 2012-06-01: qty 1

## 2012-06-01 MED ORDER — FENTANYL CITRATE 0.05 MG/ML IJ SOLN
INTRAMUSCULAR | Status: AC
  Filled 2012-06-01: qty 4

## 2012-06-01 NOTE — H&P (Signed)
Andrew [name] is an 78 y.o. male.   Chief Complaint: clotted right thigh AVG.  HPI: ESRD on HD.  S/p declot procedure to this graft 6/13 and 05/16/12. History of recent hypotension.   Past Medical History  Diagnosis Date  . Secondary hyperparathyroidism   . Peripheral vascular disease   . Hyperlipidemia   . Duodenal ulcer     remote  . Gout STABLE  . ESRD (end stage renal disease) NEPHROLOGIST-   DR [name]    Hemodialysis; Hx of bleeding from aneurysms on AVF  . Arteriosclerotic cardiovascular disease (ASCVD) 1998    CABG-1998; h/o CHF  . Cerebrovascular disease REMOTE --  PER CHART--    NO RESIDUAL  . Neuropathy of foot COLD FEET  . Degenerative joint disease   . Gastroesophageal reflux disease   . History of bladder cancer   . Impaired hearing BILATERAL     ONLY WEARS LEFT HEARING AID  . Blood transfusion   . Anemia   . Renal cell carcinoma 2001    s/p right nephrectomy-2001; subsequent ESRD  . Hypertension   . CHF (congestive heart failure)   . Renal insufficiency   . Coronary artery disease   . Shortness of breath     when the fluid is on my lungs"    Past Surgical History  Procedure Date  . Right nephrectomy 2001    Renal cell carcinoma  . Colonoscopy 01/24/2011    prominent vascular pattern, suboptimal prep but doable  . Esophagogastroduodenoscopy 01/24/2011    mild erosive reflux esophagitis, bulbar/antral erosions, bx from antrum benign  . Av fistula repair 2008    revision of anastomosis of Right AVF  . Arteriovenous graft placement 2006    Thrombectomy and interposition jump graft revision to higher axillary vein of LUA AVG  . Coronary artery bypass graft 1998    X4 VESSELS  . Multiple cysto/ resection tumor's with bx's LAST ONE 05-09-2011  . Multiple surg's / interventions for avgg/ fistula right upper arm LAST REVISION 06-29-2011    (JUNE 2010 STENT CEPHALIC VEIN/ 03-23-2011 DILATATION ANGIOPLASTY)  . Left ptc left renal artery   . Cardiac catheterization  2004  . Cataract extraction w/ intraocular lens  implant, bilateral   . Cystoscopy w/ retrogrades 12/28/2011    Procedure: CYSTOSCOPY WITH RETROGRADE PYELOGRAM;  Surgeon: [name], MD;  Location: [facility];  Service: Urology;  Laterality: Left;  C-ARM   . Transurethral resection of bladder tumor 12/28/2011    Procedure: TRANSURETHRAL RESECTION OF BLADDER TUMOR (TURBT);  Surgeon: [name], MD;  Location: [facility];  Service: Urology;  Laterality: N/A;  . Av fistula placement 02/29/2012    Procedure: INSERTION OF ARTERIOVENOUS (AV) GORE-TEX GRAFT THIGH;  Surgeon: [name], MD;  Location: [facility] OR;  Service: Vascular;  Laterality: Right;  Insertion right femoral arteriovenous gortex graft  . Ligation of right braciocephalic av fistula 04-04-2012    Family History  Problem Relation Age of Onset  . Colon cancer Neg Hx   . Liver disease Neg Hx   . Anesthesia problems Neg Hx   . Heart disease Father    Social History:  reports that he quit smoking about 28 years ago. His smoking use included Cigarettes. He has a 25 pack-year smoking history. He has never used smokeless tobacco. He reports that he does not drink alcohol or use illicit drugs.  Allergies: No Known Allergies   Review of Systems  Constitutional: Negative for  fever, chills and weight loss.  Respiratory: Negative.   Cardiovascular: Negative for chest pain, palpitations and leg swelling.  Gastrointestinal: Negative.   Neurological: Positive for tingling.  Psychiatric/Behavioral: Negative.     Blood pressure 142/85, pulse 82, temperature 97.7 F (36.5 C), temperature source Oral, SpO2 98.00%. Physical Exam  Constitutional: He is oriented to person, place, and time. He appears well-developed and well-nourished. No distress.  HENT:  Head: Normocephalic and atraumatic.  Cardiovascular: Normal rate and regular rhythm.  Exam reveals no gallop and no friction rub.   No murmur  heard. Respiratory: Effort normal and breath sounds normal. No respiratory distress. He has no wheezes.  GI: Soft. Bowel sounds are normal.  Musculoskeletal: Normal range of motion. He exhibits no edema.       Nonpalpable thrill to right thigh AVG. No evidence of hematoma or ecchymosis.   Neurological: He is alert and oriented to person, place, and time.  Skin: Skin is warm and dry.  Psychiatric: He has a normal mood and affect. His behavior is normal. Judgment and thought content normal.     Assessment/Plan Patient and daughter again talked with in regards to declot procedure.  Given that he has had 2 very recent declots, patient aware today may not be successful and may require HD catheter placement. Daughter discussed patient having delayed effects from sedation - may likely be the versed given the renal excretion mechanism and hypotension - will hold today the versed. Patient aware and also agreeable.   CAMPBELL,PAMELA D 06/01/2012, 10:47 AM

## 2012-06-01 NOTE — Procedures (Signed)
Technically successful declot of right thigh dialysis graft.  No immediate post procedural complications.  

## 2012-06-03 ENCOUNTER — Other Ambulatory Visit: Payer: Self-pay

## 2012-06-03 ENCOUNTER — Encounter (HOSPITAL_COMMUNITY): Payer: Self-pay | Admitting: *Deleted

## 2012-06-04 ENCOUNTER — Ambulatory Visit (HOSPITAL_COMMUNITY): Payer: Medicare Other | Admitting: Anesthesiology

## 2012-06-04 ENCOUNTER — Encounter (HOSPITAL_COMMUNITY): Payer: Self-pay | Admitting: Anesthesiology

## 2012-06-04 ENCOUNTER — Encounter (HOSPITAL_COMMUNITY): Payer: Self-pay | Admitting: *Deleted

## 2012-06-04 ENCOUNTER — Encounter (HOSPITAL_COMMUNITY)
Admission: RE | Disposition: A | Discharge status: Home / Self Care | Payer: Self-pay | Source: Ambulatory Visit | Attending: Vascular Surgery

## 2012-06-04 ENCOUNTER — Ambulatory Visit (HOSPITAL_COMMUNITY)
Admission: RE | Admit: 2012-06-04 | Discharge: 2012-06-04 | Disposition: A | Discharge status: Home / Self Care | Payer: Medicare Other | Source: Ambulatory Visit | Attending: Vascular Surgery | Admitting: Vascular Surgery

## 2012-06-04 ENCOUNTER — Ambulatory Visit (HOSPITAL_COMMUNITY): Payer: Medicare Other

## 2012-06-04 DIAGNOSIS — I120 Hypertensive chronic kidney disease with stage 5 chronic kidney disease or end stage renal disease: Secondary | ICD-10-CM | POA: Insufficient documentation

## 2012-06-04 DIAGNOSIS — T82898A Other specified complication of vascular prosthetic devices, implants and grafts, initial encounter: Secondary | ICD-10-CM

## 2012-06-04 DIAGNOSIS — N186 End stage renal disease: Secondary | ICD-10-CM | POA: Insufficient documentation

## 2012-06-04 DIAGNOSIS — Y849 Medical procedure, unspecified as the cause of abnormal reaction of the patient, or of later complication, without mention of misadventure at the time of the procedure: Secondary | ICD-10-CM | POA: Insufficient documentation

## 2012-06-04 DIAGNOSIS — Z992 Dependence on renal dialysis: Secondary | ICD-10-CM | POA: Insufficient documentation

## 2012-06-04 LAB — POCT I-STAT 4, (NA,K, GLUC, HGB,HCT)
Glucose, Bld: 87 mg/dL (ref 70–99)
HCT: 34 % — ABNORMAL LOW (ref 39.0–52.0)
Hemoglobin: 11.6 g/dL — ABNORMAL LOW (ref 13.0–17.0)

## 2012-06-04 LAB — SURGICAL PCR SCREEN: Staphylococcus aureus: NEGATIVE

## 2012-06-04 SURGERY — THROMBECTOMY AND REVISION OF ARTERIOVENTOUS (AV) GORETEX  GRAFT
Anesthesia: General | Site: Thigh | Laterality: Right | Wound class: Clean

## 2012-06-04 MED ORDER — HEPARIN SODIUM (PORCINE) 1000 UNIT/ML IJ SOLN
INTRAMUSCULAR | Status: DC | PRN
  Administered 2012-06-04: 7000 [IU] via INTRAVENOUS

## 2012-06-04 MED ORDER — SODIUM CHLORIDE 0.9 % IV SOLN: INTRAVENOUS | Status: DC

## 2012-06-04 MED ORDER — LIDOCAINE HCL 1 % IJ SOLN
INTRAMUSCULAR | Status: DC | PRN
  Administered 2012-06-04: 30 mg via INTRADERMAL

## 2012-06-04 MED ORDER — 0.9 % SODIUM CHLORIDE (POUR BTL) OPTIME
TOPICAL | Status: DC | PRN
  Administered 2012-06-04: 1000 mL

## 2012-06-04 MED ORDER — OXYCODONE HCL 5 MG PO TABS: 5.0000 mg | ORAL_TABLET | ORAL | Status: AC | PRN

## 2012-06-04 MED ORDER — FENTANYL CITRATE 0.05 MG/ML IJ SOLN
INTRAMUSCULAR | Status: DC | PRN
  Administered 2012-06-04: 100 ug via INTRAVENOUS

## 2012-06-04 MED ORDER — PHENYLEPHRINE HCL 10 MG/ML IJ SOLN
INTRAMUSCULAR | Status: DC | PRN
  Administered 2012-06-04 (×2): 120 ug via INTRAVENOUS
  Administered 2012-06-04: 80 ug via INTRAVENOUS
  Administered 2012-06-04: 40 ug via INTRAVENOUS
  Administered 2012-06-04: 120 ug via INTRAVENOUS

## 2012-06-04 MED ORDER — MUPIROCIN 2 % EX OINT
TOPICAL_OINTMENT | CUTANEOUS | Status: AC
  Filled 2012-06-04: qty 22

## 2012-06-04 MED ORDER — NEOSTIGMINE METHYLSULFATE 1 MG/ML IJ SOLN
INTRAMUSCULAR | Status: DC | PRN
  Administered 2012-06-04: 2 mg via INTRAVENOUS

## 2012-06-04 MED ORDER — PROPOFOL 10 MG/ML IV EMUL
INTRAVENOUS | Status: DC | PRN
  Administered 2012-06-04: 150 mg via INTRAVENOUS

## 2012-06-04 MED ORDER — CEFAZOLIN SODIUM 1-5 GM-% IV SOLN
INTRAVENOUS | Status: AC
  Administered 2012-06-04: 2 g via INTRAVENOUS
  Filled 2012-06-04: qty 50

## 2012-06-04 MED ORDER — VECURONIUM BROMIDE 10 MG IV SOLR
INTRAVENOUS | Status: DC | PRN
  Administered 2012-06-04: 5 mg via INTRAVENOUS

## 2012-06-04 MED ORDER — THROMBIN 20000 UNITS EX SOLR
CUTANEOUS | Status: AC
  Filled 2012-06-04: qty 20000

## 2012-06-04 MED ORDER — THROMBIN 20000 UNITS EX KIT
PACK | OROMUCOSAL | Status: DC | PRN
  Administered 2012-06-04: 12:00:00 via TOPICAL

## 2012-06-04 MED ORDER — ONDANSETRON HCL 4 MG/2ML IJ SOLN
INTRAMUSCULAR | Status: DC | PRN
  Administered 2012-06-04: 4 mg via INTRAVENOUS

## 2012-06-04 MED ORDER — SODIUM CHLORIDE 0.9 % IV SOLN
INTRAVENOUS | Status: DC | PRN
  Administered 2012-06-04: 10:00:00 via INTRAVENOUS

## 2012-06-04 MED ORDER — GLYCOPYRROLATE 0.2 MG/ML IJ SOLN
INTRAMUSCULAR | Status: DC | PRN
  Administered 2012-06-04: .4 mg via INTRAVENOUS

## 2012-06-04 MED ORDER — PROTAMINE SULFATE 10 MG/ML IV SOLN
INTRAVENOUS | Status: DC | PRN
  Administered 2012-06-04: 40 mg via INTRAVENOUS

## 2012-06-04 MED ORDER — SODIUM CHLORIDE 0.9 % IV SOLN
10.0000 mg | INTRAVENOUS | Status: DC | PRN
  Administered 2012-06-04: 50 ug/min via INTRAVENOUS

## 2012-06-04 MED ORDER — CEFAZOLIN SODIUM 1-5 GM-% IV SOLN: 1.0000 g | Freq: Once | INTRAVENOUS | Status: DC

## 2012-06-04 MED ORDER — SODIUM CHLORIDE 0.9 % IR SOLN
Status: DC | PRN
  Administered 2012-06-04: 12:00:00

## 2012-06-04 MED ORDER — EPHEDRINE SULFATE 50 MG/ML IJ SOLN
INTRAMUSCULAR | Status: DC | PRN
  Administered 2012-06-04: 10 mg via INTRAVENOUS
  Administered 2012-06-04: 15 mg via INTRAVENOUS

## 2012-06-04 SURGICAL SUPPLY — 49 items
ADH SKN CLS APL DERMABOND .7 (GAUZE/BANDAGES/DRESSINGS) ×1
ADH SKN CLS LQ APL DERMABOND (GAUZE/BANDAGES/DRESSINGS) ×1
BLADE SURG 15 STRL LF DISP TIS (BLADE) IMPLANT
BLADE SURG 15 STRL SS (BLADE) ×2
CANISTER SUCTION 2500CC (MISCELLANEOUS) ×2 IMPLANT
CATH EMB 4FR 80CM (CATHETERS) ×2 IMPLANT
CLIP TI MEDIUM 6 (CLIP) ×2 IMPLANT
CLIP TI WIDE RED SMALL 6 (CLIP) ×2 IMPLANT
CLOTH BEACON ORANGE TIMEOUT ST (SAFETY) ×2 IMPLANT
COVER SURGICAL LIGHT HANDLE (MISCELLANEOUS) ×4 IMPLANT
DERMABOND ADHESIVE PROPEN (GAUZE/BANDAGES/DRESSINGS) ×1
DERMABOND ADVANCED (GAUZE/BANDAGES/DRESSINGS) ×1
DERMABOND ADVANCED .7 DNX12 (GAUZE/BANDAGES/DRESSINGS) ×1 IMPLANT
DERMABOND ADVANCED .7 DNX6 (GAUZE/BANDAGES/DRESSINGS) IMPLANT
DRAPE X-RAY CASS 24X20 (DRAPES) IMPLANT
DRSG COVADERM 4X8 (GAUZE/BANDAGES/DRESSINGS) ×1 IMPLANT
ELECT REM PT RETURN 9FT ADLT (ELECTROSURGICAL) ×2
ELECTRODE REM PT RTRN 9FT ADLT (ELECTROSURGICAL) ×1 IMPLANT
GAUZE SPONGE 4X4 16PLY XRAY LF (GAUZE/BANDAGES/DRESSINGS) IMPLANT
GEL ULTRASOUND 20GR AQUASONIC (MISCELLANEOUS) IMPLANT
GLOVE BIO SURGEON STRL SZ 6.5 (GLOVE) ×2 IMPLANT
GLOVE BIO SURGEON STRL SZ7.5 (GLOVE) ×2 IMPLANT
GLOVE BIOGEL PI IND STRL 7.0 (GLOVE) IMPLANT
GLOVE BIOGEL PI IND STRL 7.5 (GLOVE) ×1 IMPLANT
GLOVE BIOGEL PI INDICATOR 7.0 (GLOVE) ×3
GLOVE BIOGEL PI INDICATOR 7.5 (GLOVE) ×1
GLOVE SURG SS PI 7.0 STRL IVOR (GLOVE) ×4 IMPLANT
GOWN STRL NON-REIN LRG LVL3 (GOWN DISPOSABLE) ×8 IMPLANT
GRAFT GORETEX STRT 7X10 (Vascular Products) ×1 IMPLANT
KIT BASIN OR (CUSTOM PROCEDURE TRAY) ×2 IMPLANT
KIT ROOM TURNOVER OR (KITS) ×2 IMPLANT
NS IRRIG 1000ML POUR BTL (IV SOLUTION) ×2 IMPLANT
PACK CV ACCESS (CUSTOM PROCEDURE TRAY) ×1 IMPLANT
PACK PERIPHERAL VASCULAR (CUSTOM PROCEDURE TRAY) ×1 IMPLANT
PAD ARMBOARD 7.5X6 YLW CONV (MISCELLANEOUS) ×5 IMPLANT
SET COLLECT BLD 21X3/4 12 (NEEDLE) IMPLANT
SPONGE SURGIFOAM ABS GEL 100 (HEMOSTASIS) IMPLANT
STOPCOCK 4 WAY LG BORE MALE ST (IV SETS) IMPLANT
SUT PROLENE 5 0 C 1 24 (SUTURE) ×1 IMPLANT
SUT PROLENE 6 0 BV (SUTURE) ×4 IMPLANT
SUT VIC AB 3-0 SH 27 (SUTURE) ×2
SUT VIC AB 3-0 SH 27X BRD (SUTURE) ×1 IMPLANT
SUT VICRYL 4-0 PS2 18IN ABS (SUTURE) ×2 IMPLANT
SYR 3ML LL SCALE MARK (SYRINGE) ×1 IMPLANT
TOWEL OR 17X24 6PK STRL BLUE (TOWEL DISPOSABLE) ×2 IMPLANT
TOWEL OR 17X26 10 PK STRL BLUE (TOWEL DISPOSABLE) ×2 IMPLANT
TUBING EXTENTION W/L.L. (IV SETS) IMPLANT
UNDERPAD 30X30 INCONTINENT (UNDERPADS AND DIAPERS) ×2 IMPLANT
WATER STERILE IRR 1000ML POUR (IV SOLUTION) ×2 IMPLANT

## 2012-06-04 NOTE — Anesthesia Postprocedure Evaluation (Signed)
  Anesthesia Post-op Note  Patient: Andrew [name]  Procedure(s) Performed: Procedure(s) (LRB): THROMBECTOMY AND REVISION OF ARTERIOVENTOUS (AV) GORETEX  GRAFT (Right)  Patient Location: PACU  Anesthesia Type: General  Level of Consciousness: awake, alert  and oriented  Airway and Oxygen Therapy: Patient Spontanous Breathing and Patient connected to nasal cannula oxygen  Post-op Pain: mild  Post-op Assessment: Post-op Vital signs reviewed and Patient's Cardiovascular Status Stable  Post-op Vital Signs: stable  Complications: No apparent anesthesia complications

## 2012-06-04 NOTE — Transfer of Care (Signed)
Immediate Anesthesia Transfer of Care Note  Patient: Andrew [name]  Procedure(s) Performed: Procedure(s) (LRB): THROMBECTOMY AND REVISION OF ARTERIOVENTOUS (AV) GORETEX  GRAFT (Right)  Patient Location: PACU  Anesthesia Type: General  Level of Consciousness: awake, alert  and oriented  Airway & Oxygen Therapy: Patient Spontanous Breathing and Patient connected to face mask oxygen  Post-op Assessment: Report given to PACU RN, Post -op Vital signs reviewed and stable and Patient moving all extremities X 4  Post vital signs: Reviewed and stable  Complications: No apparent anesthesia complications

## 2012-06-04 NOTE — H&P (Signed)
[facility] [place]  Patient name: Andrew [name] MRN: [id number] DOB: 09/07/1926 Sex: male  REASON FOR CONSULT: clotted right thight AVG  HPI: Andrew [name] is a 78 y.o. male who presents with a clotted right thigh AVG. He dialyzed Saturday. He had throbolysis in IR 3 days ago.  Past Medical History  Diagnosis Date  . Secondary hyperparathyroidism   . Peripheral vascular disease   . Hyperlipidemia   . Duodenal ulcer     remote  . Gout STABLE  . ESRD (end stage renal disease) NEPHROLOGIST-   DR [name]    Hemodialysis; Hx of bleeding from aneurysms on AVF  . Arteriosclerotic cardiovascular disease (ASCVD) 1998    CABG-1998; h/o CHF  . Cerebrovascular disease REMOTE --  PER CHART--    NO RESIDUAL  . Neuropathy of foot COLD FEET  . Degenerative joint disease   . Gastroesophageal reflux disease   . History of bladder cancer   . Impaired hearing BILATERAL     ONLY WEARS LEFT HEARING AID  . Blood transfusion   . Anemia   . Renal cell carcinoma 2001    s/p right nephrectomy-2001; subsequent ESRD  . Hypertension   . CHF (congestive heart failure)   . Renal insufficiency   . Coronary artery disease   . Shortness of breath     when the fluid is on my lungs"    Family History  Problem Relation Age of Onset  . Colon cancer Neg Hx   . Liver disease Neg Hx   . Anesthesia problems Neg Hx   . Heart disease Father     SOCIAL HISTORY: History  Substance Use Topics  . Smoking status: Former Smoker -- 1.0 packs/day for 25 years    Types: Cigarettes    Quit date: 02/26/1984  . Smokeless tobacco: Never Used   Comment: quit 1985  . Alcohol Use: No    No Known Allergies  Current Facility-Administered Medications  Medication Dose Route Frequency Provider Last Rate Last Dose  . 0.9 %  sodium chloride infusion   Intravenous Continuous Christopher S Dickson, MD      . ceFAZolin (ANCEF) 1-5 GM-% IVPB           . ceFAZolin (ANCEF) IVPB 1 g/50 mL premix  1 g  Intravenous Once Christopher S Dickson, MD      . mupirocin ointment (BACTROBAN) 2 %            Facility-Administered Medications Ordered in Other Encounters  Medication Dose Route Frequency Provider Last Rate Last Dose  . albuterol (PROVENTIL) (5 MG/ML) 0.5% nebulizer solution 2.5 mg  2.5 mg Nebulization Q2H PRN Robert D Schertz, MD      . methylPREDNISolone sodium succinate (SOLU-MEDROL) 125 mg/2 mL injection 60 mg  60 mg Intravenous Once Robert D Schertz, MD        REVIEW OF SYSTEMS: [X ] denotes positive finding; [  ] denotes negative finding CARDIOVASCULAR:  [ ] chest pain   [ ] chest pressure   [ ] palpitations   [ ] orthopnea   [ ] dyspnea on exertion   [ ] claudication   [ ] rest pain   [ ] DVT   [ ] phlebitis PULMONARY:   [ ] productive cough   [ ] asthma   [ ] wheezing NEUROLOGIC:   [ ] weakness  [ ] paresthesias  [ ] aphasia  [ ] amaurosis  [ ] dizziness HEMATOLOGIC:   [ ]   bleeding problems   [ ] clotting disorders MUSCULOSKELETAL:  [ ] joint pain   [ ] joint swelling [ ] leg swelling GASTROINTESTINAL: [ ]  blood in stool  [ ]  hematemesis GENITOURINARY:  [ ]  dysuria  [ ]  hematuria PSYCHIATRIC:  [ ] history of major depression INTEGUMENTARY:  [ ] rashes  [ ] ulcers CONSTITUTIONAL:  [ ] fever   [ ] chills  PHYSICAL EXAM: Filed Vitals:   06/04/12 0721 06/04/12 0728  BP:  174/85  Pulse:  80  Temp:  97.7 F (36.5 C)  TempSrc: Oral   Resp:  16  SpO2:  97%   There is no height or weight on file to calculate BMI. GENERAL: The patient is a well-nourished male, in no acute distress. The vital signs are documented above. CARDIOVASCULAR: There is a regular rate and rhythm without significant murmur appreciated.  PULMONARY: There is good air exchange bilaterally without wheezing or rales. ABDOMEN: Soft and non-tender with normal pitched bowel sounds.  MUSCULOSKELETAL: There are no major deformities or cyanosis. NEUROLOGIC: No focal weakness or paresthesias are detected. SKIN:  There are no ulcers or rashes noted. PSYCHIATRIC: The patient has a normal affect.  DATA:  Lab Results  Component Value Date   WBC 6.1 05/17/2012   HGB 11.6* 06/04/2012   HCT 34.0* 06/04/2012   MCV 98.9 05/17/2012   PLT 108* 05/17/2012   Lab Results  Component Value Date   NA 137 06/04/2012   K 5.9* 06/04/2012   CL 96 05/17/2012   CO2 26 05/17/2012   Lab Results  Component Value Date   CREATININE 8.01* 05/17/2012   Lab Results  Component Value Date   INR 1.07 04/04/2012   INR 1.17 03/10/2012   INR 1.2 05/04/2009   Lab Results  Component Value Date   HGBA1C 4.5 03/10/2012   CBG (last 3)  No results found for this basename: GLUCAP:3 in the last 72 hours  I reviewed his fistulogram. No problems were identified.    MEDICAL ISSUES: For thrombectomy of right thigh AVG  DICKSON,CHRISTOPHER S [facility] [place] [facility]: [phone]   

## 2012-06-04 NOTE — Preoperative (Signed)
Beta Blockers   Reason not to administer Beta Blockers:Not Applicable 

## 2012-06-04 NOTE — Anesthesia Procedure Notes (Signed)
Procedure Name: LMA Insertion and Intubation Date/Time: 06/04/2012 9:56 AM Performed by: Carmela Rima Pre-anesthesia Checklist: Timeout performed, Patient identified, Emergency Drugs available, Suction available and Patient being monitored Patient Re-evaluated:Patient Re-evaluated prior to inductionOxygen Delivery Method: Circle system utilized Preoxygenation: Pre-oxygenation with 100% oxygen Intubation Type: IV induction and Cricoid Pressure applied Ventilation: Mask ventilation without difficulty LMA: LMA inserted LMA Size: 4.0 Laryngoscope Size: Mac and 3 (LMA with bad seal.  Removed. Intubated with ETT) Grade View: Grade II Tube type: Oral Tube size: 7.5 mm Number of attempts: 2 Placement Confirmation: ETT inserted through vocal cords under direct vision,  breath sounds checked- equal and bilateral and positive ETCO2 Secured at: 23 cm Tube secured with: Tape Dental Injury: Injury to lip

## 2012-06-04 NOTE — Anesthesia Preprocedure Evaluation (Addendum)
Anesthesia Evaluation  Patient identified by MRN, date of birth, ID band Patient awake    Reviewed: Allergy & Precautions, H&P , NPO status , Patient's Chart, lab work & pertinent test results, reviewed documented beta blocker date and time   Airway Mallampati: II TM Distance: >3 FB Neck ROM: full    Dental  (+) Dental Advidsory Given and Teeth Intact   Pulmonary shortness of breath and at rest,    + decreased breath sounds      Cardiovascular hypertension, Pt. on medications + CAD Rhythm:Regular Rate:Normal     Neuro/Psych  Neuromuscular disease    GI/Hepatic PUD, GERD-  Medicated and Controlled,  Endo/Other    Renal/GU      Musculoskeletal   Abdominal   Peds  Hematology   Anesthesia Other Findings   Reproductive/Obstetrics                          Anesthesia Physical Anesthesia Plan  ASA: III  Anesthesia Plan: General LMA   Post-op Pain Management:    Induction:   Airway Management Planned:   Additional Equipment:   Intra-op Plan:   Post-operative Plan:   Informed Consent: I have reviewed the patients History and Physical, chart, labs and discussed the procedure including the risks, benefits and alternatives for the proposed anesthesia with the patient or authorized representative who has indicated his/her understanding and acceptance.   Dental Advisory Given  Plan Discussed with: Anesthesiologist, CRNA and Surgeon  Anesthesia Plan Comments:        Anesthesia Quick Evaluation

## 2012-06-04 NOTE — Op Note (Signed)
NAME: Andrew [name]   MRN: [id number] DOB: 08/23/1926    DATE OF OPERATION: 06/04/2012  PREOP DIAGNOSIS: chronic kidney disease  POSTOP DIAGNOSIS: same  PROCEDURE: thrombectomy and revision of right thigh AV graft  SURGEON: Di Kindle. Edilia Bo, MD, FACS  ASSIST: Della Goo PA  ANESTHESIA: Gen.   EBL: minimal  INDICATIONS: [name] is a 78 y.o. male who had a right thigh AV graft placed in April. This is now the fourth time that it has clotted. He has undergone thrombolysis by interventional radiology 3 times recently. We were asked to proceed with surgical thrombectomy.  FINDINGS: there was some intimal hyperplasia at the venous anastomosis and I jump higher up in the femoral vein. The venous anastomosis is fairly high in further revision given the level and the amount of scar tissue would be very difficult.  TECHNIQUE: Patient was taken to the operating room and received a general anesthetic. The right groin and thigh were prepped and draped in the usual sterile fashion. A longitudinal incision was made in the right groin. Through dense scar tissue the arterial limb of the graft and venous limb of the graft were dissected free. The dissection was quite difficult because of scar tissue. The artery was calcified. The anastomosis was fairly high on the femoral vein. The patient was heparinized. The venous limb of the graft was divided. Graft thrombectomy was achieved using a #4 Fogarty catheter. There were no problems and pulling the catheter through the body of the graft. I could not pass the catheter through the arterial anastomosis. Therefore, the arterial limb of the graft was divided and the arterial end thrombectomized under direct vision. The arterial anastomosis was widely patent. The graft at the arterial end was sewn back into in with 6-0 Prolene suture. Next a Satinsky clamp was used to clamp the femoral vein and the old venous anastomosis was taken down. I extended the  venotomy higher on the femoral vein just under the inguinal ligament. A new segment of 7 mm PTFE graft was spatulated and sewn end to side to the vein using continuous 6-0 Prolene suture. The new segment of graft was cut to the appropriate length and sewn end-to-end to the old graft using continuous 6-0 Prolene suture. At the completion was a good thrill in the graft. Of note his pressure didn't run low throughout the case. Hemostasis was obtained in the wound and Gelfoam and thrombin were used. The heparin was partially reversed with protamine. The wound was closed with deep layer of 2-0 Vicryl. The subcutaneous layer was closed with 3-0 Vicryl. The skin was closed with a 4-0 subcuticular stitch. Sterile dressing was applied. All needle and sponge counts were correct. The patient tolerated the procedure well and was transferred to the recovery room in stable condition.  [name], MD, FACS [facility]  DATE OF DICTATION:   06/04/2012

## 2012-06-05 ENCOUNTER — Encounter (HOSPITAL_COMMUNITY): Payer: Self-pay | Admitting: *Deleted

## 2012-06-05 ENCOUNTER — Ambulatory Visit (HOSPITAL_COMMUNITY): Admission: RE | Admit: 2012-06-05 | Payer: Medicare Other | Source: Ambulatory Visit | Admitting: Vascular Surgery

## 2012-06-05 ENCOUNTER — Encounter (HOSPITAL_COMMUNITY): Payer: Self-pay | Admitting: Certified Registered"

## 2012-06-05 ENCOUNTER — Ambulatory Visit (HOSPITAL_COMMUNITY): Payer: Medicare Other

## 2012-06-05 ENCOUNTER — Ambulatory Visit (HOSPITAL_COMMUNITY): Payer: Medicare Other | Admitting: Certified Registered"

## 2012-06-05 ENCOUNTER — Encounter (HOSPITAL_COMMUNITY)
Admission: RE | Disposition: A | Discharge status: Home / Self Care | Payer: Self-pay | Source: Ambulatory Visit | Attending: Vascular Surgery

## 2012-06-05 ENCOUNTER — Other Ambulatory Visit: Payer: Self-pay

## 2012-06-05 ENCOUNTER — Ambulatory Visit (HOSPITAL_COMMUNITY)
Admission: RE | Admit: 2012-06-05 | Discharge: 2012-06-05 | Disposition: A | Discharge status: Home / Self Care | Payer: Medicare Other | Source: Ambulatory Visit | Attending: Vascular Surgery | Admitting: Vascular Surgery

## 2012-06-05 DIAGNOSIS — I252 Old myocardial infarction: Secondary | ICD-10-CM | POA: Insufficient documentation

## 2012-06-05 DIAGNOSIS — I739 Peripheral vascular disease, unspecified: Secondary | ICD-10-CM | POA: Insufficient documentation

## 2012-06-05 DIAGNOSIS — E785 Hyperlipidemia, unspecified: Secondary | ICD-10-CM | POA: Insufficient documentation

## 2012-06-05 DIAGNOSIS — N186 End stage renal disease: Secondary | ICD-10-CM | POA: Insufficient documentation

## 2012-06-05 DIAGNOSIS — I2510 Atherosclerotic heart disease of native coronary artery without angina pectoris: Secondary | ICD-10-CM | POA: Insufficient documentation

## 2012-06-05 DIAGNOSIS — I120 Hypertensive chronic kidney disease with stage 5 chronic kidney disease or end stage renal disease: Secondary | ICD-10-CM | POA: Insufficient documentation

## 2012-06-05 DIAGNOSIS — I509 Heart failure, unspecified: Secondary | ICD-10-CM | POA: Insufficient documentation

## 2012-06-05 DIAGNOSIS — R0602 Shortness of breath: Secondary | ICD-10-CM | POA: Insufficient documentation

## 2012-06-05 DIAGNOSIS — M109 Gout, unspecified: Secondary | ICD-10-CM | POA: Insufficient documentation

## 2012-06-05 DIAGNOSIS — N2581 Secondary hyperparathyroidism of renal origin: Secondary | ICD-10-CM | POA: Insufficient documentation

## 2012-06-05 DIAGNOSIS — K219 Gastro-esophageal reflux disease without esophagitis: Secondary | ICD-10-CM | POA: Insufficient documentation

## 2012-06-05 DIAGNOSIS — Z8711 Personal history of peptic ulcer disease: Secondary | ICD-10-CM | POA: Insufficient documentation

## 2012-06-05 DIAGNOSIS — Z992 Dependence on renal dialysis: Secondary | ICD-10-CM | POA: Insufficient documentation

## 2012-06-05 HISTORY — PX: INSERTION OF DIALYSIS CATHETER: SHX1324

## 2012-06-05 LAB — POCT I-STAT 4, (NA,K, GLUC, HGB,HCT): Hemoglobin: 9.5 g/dL — ABNORMAL LOW (ref 13.0–17.0)

## 2012-06-05 SURGERY — INSERTION OF DIALYSIS CATHETER
Anesthesia: Monitor Anesthesia Care | Site: Neck

## 2012-06-05 SURGERY — INSERTION OF DIALYSIS CATHETER
Anesthesia: Monitor Anesthesia Care | Site: Neck | Laterality: Left | Wound class: Clean

## 2012-06-05 MED ORDER — LIDOCAINE HCL (PF) 1 % IJ SOLN
INTRAMUSCULAR | Status: AC
  Filled 2012-06-05: qty 30

## 2012-06-05 MED ORDER — HEPARIN SODIUM (PORCINE) 1000 UNIT/ML IJ SOLN
INTRAMUSCULAR | Status: DC | PRN
  Administered 2012-06-05: 4.6 mL

## 2012-06-05 MED ORDER — SODIUM CHLORIDE 0.9 % IV SOLN: INTRAVENOUS | Status: DC

## 2012-06-05 MED ORDER — CEFAZOLIN SODIUM 1-5 GM-% IV SOLN
INTRAVENOUS | Status: DC | PRN
  Administered 2012-06-05: 2 g via INTRAVENOUS

## 2012-06-05 MED ORDER — ONDANSETRON HCL 4 MG/2ML IJ SOLN: 4.0000 mg | Freq: Once | INTRAMUSCULAR | Status: DC | PRN

## 2012-06-05 MED ORDER — SODIUM CHLORIDE 0.9 % IR SOLN
Status: DC | PRN
  Administered 2012-06-05: 1000 mL

## 2012-06-05 MED ORDER — HYDROMORPHONE HCL PF 1 MG/ML IJ SOLN: 0.2500 mg | INTRAMUSCULAR | Status: DC | PRN

## 2012-06-05 MED ORDER — SODIUM CHLORIDE 0.9 % IR SOLN
Status: DC | PRN
  Administered 2012-06-05: 13:00:00

## 2012-06-05 MED ORDER — LIDOCAINE HCL (PF) 1 % IJ SOLN
INTRAMUSCULAR | Status: DC | PRN
  Administered 2012-06-05: 14 mL

## 2012-06-05 MED ORDER — HEPARIN SODIUM (PORCINE) 1000 UNIT/ML IJ SOLN
INTRAMUSCULAR | Status: AC
  Filled 2012-06-05: qty 1

## 2012-06-05 MED ORDER — CEFAZOLIN SODIUM-DEXTROSE 2-3 GM-% IV SOLR
2.0000 g | Freq: Once | INTRAVENOUS | Status: DC
  Filled 2012-06-05: qty 50

## 2012-06-05 MED ORDER — SODIUM CHLORIDE 0.9 % IV SOLN
INTRAVENOUS | Status: DC | PRN
  Administered 2012-06-05: 13:00:00 via INTRAVENOUS

## 2012-06-05 MED ORDER — PROPOFOL 10 MG/ML IV EMUL
INTRAVENOUS | Status: DC | PRN
  Administered 2012-06-05: 75 ug/kg/min via INTRAVENOUS

## 2012-06-05 SURGICAL SUPPLY — 45 items
BAG DECANTER FOR FLEXI CONT (MISCELLANEOUS) ×2 IMPLANT
CATH CANNON HEMO 15F 50CM (CATHETERS) IMPLANT
CATH CANNON HEMO 15FR 19 (HEMODIALYSIS SUPPLIES) IMPLANT
CATH CANNON HEMO 15FR 23CM (HEMODIALYSIS SUPPLIES) ×1 IMPLANT
CATH CANNON HEMO 15FR 31CM (HEMODIALYSIS SUPPLIES) IMPLANT
CATH CANNON HEMO 15FR 32 (HEMODIALYSIS SUPPLIES) IMPLANT
CATH CANNON HEMO 15FR 32CM (HEMODIALYSIS SUPPLIES) IMPLANT
CLOTH BEACON ORANGE TIMEOUT ST (SAFETY) ×2 IMPLANT
COVER PROBE W GEL 5X96 (DRAPES) IMPLANT
COVER SURGICAL LIGHT HANDLE (MISCELLANEOUS) ×2 IMPLANT
DECANTER SPIKE VIAL GLASS SM (MISCELLANEOUS) ×2 IMPLANT
DRAPE C-ARM 42X72 X-RAY (DRAPES) ×2 IMPLANT
DRAPE CHEST BREAST 15X10 FENES (DRAPES) ×2 IMPLANT
GAUZE SPONGE 2X2 8PLY STRL LF (GAUZE/BANDAGES/DRESSINGS) ×1 IMPLANT
GAUZE SPONGE 4X4 16PLY XRAY LF (GAUZE/BANDAGES/DRESSINGS) ×2 IMPLANT
GLOVE BIOGEL PI IND STRL 7.5 (GLOVE) IMPLANT
GLOVE BIOGEL PI INDICATOR 7.5 (GLOVE) ×1
GLOVE SS BIOGEL STRL SZ 7 (GLOVE) ×1 IMPLANT
GLOVE SS N UNI LF 7.5 STRL (GLOVE) ×1 IMPLANT
GLOVE SUPERSENSE BIOGEL SZ 7 (GLOVE) ×1
GOWN PREVENTION PLUS XLARGE (GOWN DISPOSABLE) ×1 IMPLANT
GOWN STRL NON-REIN LRG LVL3 (GOWN DISPOSABLE) ×4 IMPLANT
KIT BASIN OR (CUSTOM PROCEDURE TRAY) ×2 IMPLANT
KIT ROOM TURNOVER OR (KITS) ×2 IMPLANT
NDL 18GX1X1/2 (RX/OR ONLY) (NEEDLE) ×1 IMPLANT
NDL HYPO 25GX1X1/2 BEV (NEEDLE) ×1 IMPLANT
NEEDLE 18GX1X1/2 (RX/OR ONLY) (NEEDLE) ×2 IMPLANT
NEEDLE 22X1 1/2 (OR ONLY) (NEEDLE) ×2 IMPLANT
NEEDLE HYPO 25GX1X1/2 BEV (NEEDLE) ×2 IMPLANT
NS IRRIG 1000ML POUR BTL (IV SOLUTION) ×2 IMPLANT
PACK SURGICAL SETUP 50X90 (CUSTOM PROCEDURE TRAY) ×2 IMPLANT
PAD ARMBOARD 7.5X6 YLW CONV (MISCELLANEOUS) ×4 IMPLANT
SOAP 2 % CHG 4 OZ (WOUND CARE) ×2 IMPLANT
SPONGE GAUZE 2X2 STER 10/PKG (GAUZE/BANDAGES/DRESSINGS) ×1
SUT ETHILON 3 0 PS 1 (SUTURE) ×2 IMPLANT
SUT VICRYL 4-0 PS2 18IN ABS (SUTURE) ×2 IMPLANT
SYR 20CC LL (SYRINGE) ×2 IMPLANT
SYR 30ML LL (SYRINGE) IMPLANT
SYR 5ML LL (SYRINGE) ×4 IMPLANT
SYR CONTROL 10ML LL (SYRINGE) ×2 IMPLANT
SYRINGE 10CC LL (SYRINGE) ×2 IMPLANT
TAPE CLOTH SURG 4X10 WHT LF (GAUZE/BANDAGES/DRESSINGS) ×1 IMPLANT
TOWEL OR 17X24 6PK STRL BLUE (TOWEL DISPOSABLE) ×2 IMPLANT
TOWEL OR 17X26 10 PK STRL BLUE (TOWEL DISPOSABLE) ×2 IMPLANT
WATER STERILE IRR 1000ML POUR (IV SOLUTION) ×2 IMPLANT

## 2012-06-05 NOTE — Anesthesia Preprocedure Evaluation (Signed)
Anesthesia Evaluation  Patient identified by MRN, date of birth, ID band Patient awake    Reviewed: Allergy & Precautions, H&P , NPO status , Patient's Chart, lab work & pertinent test results  Airway Mallampati: I TM Distance: >3 FB Neck ROM: full    Dental   Pulmonary shortness of breath,          Cardiovascular hypertension, + CAD, + Past MI, + Peripheral Vascular Disease and +CHF Rhythm:regular Rate:Normal     Neuro/Psych PSYCHIATRIC DISORDERS  Neuromuscular disease    GI/Hepatic PUD, GERD-  ,  Endo/Other    Renal/GU CRF, ESRF and Dialysis     Musculoskeletal   Abdominal   Peds  Hematology   Anesthesia Other Findings   Reproductive/Obstetrics                           Anesthesia Physical Anesthesia Plan  ASA: IV  Anesthesia Plan: MAC   Post-op Pain Management:    Induction: Intravenous  Airway Management Planned: Mask  Additional Equipment:   Intra-op Plan:   Post-operative Plan:   Informed Consent: I have reviewed the patients History and Physical, chart, labs and discussed the procedure including the risks, benefits and alternatives for the proposed anesthesia with the patient or authorized representative who has indicated his/her understanding and acceptance.     Plan Discussed with: CRNA, Anesthesiologist and Surgeon  Anesthesia Plan Comments:         Anesthesia Quick Evaluation

## 2012-06-05 NOTE — Anesthesia Postprocedure Evaluation (Signed)
  Anesthesia Post-op Note  Patient: Andrew [name]  Procedure(s) Performed: Procedure(s) (LRB): INSERTION OF DIALYSIS CATHETER (Left)  Patient Location: PACU  Anesthesia Type: MAC  Level of Consciousness: awake, alert , oriented and patient cooperative  Airway and Oxygen Therapy: Patient Spontanous Breathing and Patient connected to nasal cannula oxygen  Post-op Pain: none  Post-op Assessment: Post-op Vital signs reviewed, Patient's Cardiovascular Status Stable, Respiratory Function Stable, Patent Airway, No signs of Nausea or vomiting and Pain level controlled  Post-op Vital Signs: stable  Complications: No apparent anesthesia complications

## 2012-06-05 NOTE — Transfer of Care (Signed)
Immediate Anesthesia Transfer of Care Note  Patient: Andrew [name]  Procedure(s) Performed: Procedure(s) (LRB): INSERTION OF DIALYSIS CATHETER (Left)  Patient Location: PACU  Anesthesia Type: MAC  Level of Consciousness: awake, alert , oriented and patient cooperative  Airway & Oxygen Therapy: Patient Spontanous Breathing and Patient connected to nasal cannula oxygen  Post-op Assessment: Report given to PACU RN  Post vital signs: Reviewed and stable  Complications: No apparent anesthesia complications

## 2012-06-05 NOTE — Interval H&P Note (Signed)
History and Physical Interval Note:  06/05/2012 12:10 PM  Andrew [name]  has presented today for surgery, with the diagnosis of ESRD  The various methods of treatment have been discussed with the patient and family. After consideration of risks, benefits and other options for treatment, the patient has consented to  Procedure(s) (LRB): INSERTION OF DIALYSIS CATHETER (N/A) as a surgical intervention .  The patient's history has been reviewed, patient examined, no change in status, stable for surgery.  I have reviewed the patients' chart and labs.  Questions were answered to the patient's satisfaction.     Josephina Gip

## 2012-06-05 NOTE — H&P (View-Only) (Signed)
[facility] [place]  Patient name: Andrew [name] MRN: [id number] DOB: Mar 29, 1926 Sex: male  REASON FOR CONSULT: clotted right thight AVG  HPI: Andrew [name] is a 78 y.o. male who presents with a clotted right thigh AVG. He dialyzed Saturday. He had throbolysis in IR 3 days ago.  Past Medical History  Diagnosis Date  . Secondary hyperparathyroidism   . Peripheral vascular disease   . Hyperlipidemia   . Duodenal ulcer     remote  . Gout STABLE  . ESRD (end stage renal disease) NEPHROLOGIST-   DR [name]    Hemodialysis; Hx of bleeding from aneurysms on AVF  . Arteriosclerotic cardiovascular disease (ASCVD) 1998    CABG-1998; h/o CHF  . Cerebrovascular disease REMOTE --  PER CHART--    NO RESIDUAL  . Neuropathy of foot COLD FEET  . Degenerative joint disease   . Gastroesophageal reflux disease   . History of bladder cancer   . Impaired hearing BILATERAL     ONLY WEARS LEFT HEARING AID  . Blood transfusion   . Anemia   . Renal cell carcinoma 2001    s/p right nephrectomy-2001; subsequent ESRD  . Hypertension   . CHF (congestive heart failure)   . Renal insufficiency   . Coronary artery disease   . Shortness of breath     when the fluid is on my lungs"    Family History  Problem Relation Age of Onset  . Colon cancer Neg Hx   . Liver disease Neg Hx   . Anesthesia problems Neg Hx   . Heart disease Father     SOCIAL HISTORY: History  Substance Use Topics  . Smoking status: Former Smoker -- 1.0 packs/day for 25 years    Types: Cigarettes    Quit date: 02/26/1984  . Smokeless tobacco: Never Used   Comment: quit 1985  . Alcohol Use: No    No Known Allergies  Current Facility-Administered Medications  Medication Dose Route Frequency Provider Last Rate Last Dose  . 0.9 %  sodium chloride infusion   Intravenous Continuous Chuck Hint, MD      . ceFAZolin (ANCEF) 1-5 GM-% IVPB           . ceFAZolin (ANCEF) IVPB 1 g/50 mL premix  1 g  Intravenous Once Chuck Hint, MD      . mupirocin ointment (BACTROBAN) 2 %            Facility-Administered Medications Ordered in Other Encounters  Medication Dose Route Frequency Provider Last Rate Last Dose  . albuterol (PROVENTIL) (5 MG/ML) 0.5% nebulizer solution 2.5 mg  2.5 mg Nebulization Q2H PRN Maree Krabbe, MD      . methylPREDNISolone sodium succinate (SOLU-MEDROL) 125 mg/2 mL injection 60 mg  60 mg Intravenous Once Maree Krabbe, MD        REVIEW OF SYSTEMS: Arly.Keller ] denotes positive finding; [  ] denotes negative finding CARDIOVASCULAR:  [ ]  chest pain   [ ]  chest pressure   [ ]  palpitations   [ ]  orthopnea   [ ]  dyspnea on exertion   [ ]  claudication   [ ]  rest pain   [ ]  DVT   [ ]  phlebitis PULMONARY:   [ ]  productive cough   [ ]  asthma   [ ]  wheezing NEUROLOGIC:   [ ]  weakness  [ ]  paresthesias  [ ]  aphasia  [ ]  amaurosis  [ ]  dizziness HEMATOLOGIC:   [ ]   bleeding problems   [ ]  clotting disorders MUSCULOSKELETAL:  [ ]  joint pain   [ ]  joint swelling [ ]  leg swelling GASTROINTESTINAL: [ ]   blood in stool  [ ]   hematemesis GENITOURINARY:  [ ]   dysuria  [ ]   hematuria PSYCHIATRIC:  [ ]  history of major depression INTEGUMENTARY:  [ ]  rashes  [ ]  ulcers CONSTITUTIONAL:  [ ]  fever   [ ]  chills  PHYSICAL EXAM: Filed Vitals:   06/04/12 0721 06/04/12 0728  BP:  174/85  Pulse:  80  Temp:  97.7 F (36.5 C)  TempSrc: Oral   Resp:  16  SpO2:  97%   There is no height or weight on file to calculate BMI. GENERAL: The patient is a well-nourished male, in no acute distress. The vital signs are documented above. CARDIOVASCULAR: There is a regular rate and rhythm without significant murmur appreciated.  PULMONARY: There is good air exchange bilaterally without wheezing or rales. ABDOMEN: Soft and non-tender with normal pitched bowel sounds.  MUSCULOSKELETAL: There are no major deformities or cyanosis. NEUROLOGIC: No focal weakness or paresthesias are detected. SKIN:  There are no ulcers or rashes noted. PSYCHIATRIC: The patient has a normal affect.  DATA:  Lab Results  Component Value Date   WBC 6.1 05/17/2012   HGB 11.6* 06/04/2012   HCT 34.0* 06/04/2012   MCV 98.9 05/17/2012   PLT 108* 05/17/2012   Lab Results  Component Value Date   NA 137 06/04/2012   K 5.9* 06/04/2012   CL 96 05/17/2012   CO2 26 05/17/2012   Lab Results  Component Value Date   CREATININE 8.01* 05/17/2012   Lab Results  Component Value Date   INR 1.07 04/04/2012   INR 1.17 03/10/2012   INR 1.2 05/04/2009   Lab Results  Component Value Date   HGBA1C 4.5 03/10/2012   CBG (last 3)  No results found for this basename: GLUCAP:3 in the last 72 hours  I reviewed his fistulogram. No problems were identified.    MEDICAL ISSUES: For thrombectomy of right thigh AVG  DICKSON,CHRISTOPHER S [facility] [place] [facility]: [phone]

## 2012-06-05 NOTE — Preoperative (Signed)
Beta Blockers   Reason not to administer Beta Blockers:Not Applicable 

## 2012-06-05 NOTE — Op Note (Signed)
OPERATIVE REPORT  Date of Surgery: 06/05/2012  Surgeon: Josephina Gip, MD  Assistant: Nurse  Pre-op Diagnosis: ESRD  Post-op Diagnosis: ESRD  Procedure: Procedure(s): #1 insertion diateck hemodialysis catheter via left internal jugular vein-23 cm #2 bilateral ultrasound localization internal jugular veins  Anesthesia: General  EBL: Minimal  Complications: None  Procedure Details: The patient was taken to the operating room placed in the supine position at which time the upper chest and neck were exposed. Both internal jugular veins were imaged using B-mode ultrasound. The right IJ appeared to be occluded. The left IJ was widely patent. After prepping and draping in the routine sterile manner left internal jugular vein was entered using a supraclavicular approach guidewire passed into the right atrium under fluoroscopic guidance. After dilating the track appropriately a 23 cm dialysis cath was passed through a peel-away sheath positioned in the right atrium tunneled peripherally and secured with nylon sutures. Wound was closed with Vicryl in a subcuticular fashion. Sterile dressing applied patient taken to the recovery room in stable condition  Josephina Gip, MD 06/05/2012 1:19 PM

## 2012-06-05 NOTE — Anesthesia Procedure Notes (Signed)
Procedure Name: MAC Performed by: Ervin Knack Pre-anesthesia Checklist: Patient identified, Emergency Drugs available, Suction available and Patient being monitored Patient Re-evaluated:Patient Re-evaluated prior to inductionOxygen Delivery Method: Nasal cannula Intubation Type: IV induction

## 2012-06-06 ENCOUNTER — Encounter (HOSPITAL_COMMUNITY): Payer: Self-pay | Admitting: Vascular Surgery

## 2012-06-18 ENCOUNTER — Encounter: Payer: Self-pay | Admitting: Adult Health

## 2012-06-18 ENCOUNTER — Ambulatory Visit (INDEPENDENT_AMBULATORY_CARE_PROVIDER_SITE_OTHER): Payer: Medicare Other | Admitting: Adult Health

## 2012-06-18 VITALS — BP 122/55 | HR 74 | Ht 68.0 in | Wt 171.1 lb

## 2012-06-18 DIAGNOSIS — I2510 Atherosclerotic heart disease of native coronary artery without angina pectoris: Secondary | ICD-10-CM

## 2012-06-18 DIAGNOSIS — I7090 Unspecified atherosclerosis: Secondary | ICD-10-CM

## 2012-06-18 DIAGNOSIS — I5031 Acute diastolic (congestive) heart failure: Secondary | ICD-10-CM

## 2012-06-18 DIAGNOSIS — I509 Heart failure, unspecified: Secondary | ICD-10-CM

## 2012-06-18 NOTE — Patient Instructions (Addendum)
Your physician wants you to follow-up in: 6 months with Kathryn Lawrence, NP. You will receive a reminder letter in the mail two months in advance. If you don't receive a letter, please call our office to schedule the follow-up appointment.  

## 2012-06-18 NOTE — Assessment & Plan Note (Addendum)
No complaints of chest pain, palpitations or significant DOE. He is doing well from cardiac standpoint on this visit. I reviewed records. He has had an echo in 02/2012 showing mild AoV stenosis and moderately thickened MV.. Will follow this in 2 years or sooner, should he become symptomatic. Labs are followed by Dr. Lowell Guitar to include cholesterol level.

## 2012-06-18 NOTE — Assessment & Plan Note (Signed)
No evidence of fluid overload or chest congestion. He is at baseline with breathing status.  Continue to follow symptomatically.

## 2012-06-18 NOTE — Progress Notes (Signed)
HPI: [name] is a 78 y/o patient of [facility]Rothbart we are following of rongoing assessment and treatment, s/p CABG, hypertension, hyperlipidemia, with known history of ESRD. He was recently hospitalized for right thigh, AV fistula clot and new dialysis catheter placement to left internal jugular vein. He did well during hospitalization and is now continuing with dialysis via [facility], Andrew [name].    He complains of neurologic pain in his feet and legs post dialysis and has found relief with the use of gabapentin BID. He states that the medication worked at first but has not been doing so well lately. On last visit, Andrew [name] increased the dose.    He has no cardiac complaints.  No Known Allergies  Current Outpatient Prescriptions  Medication Sig Dispense Refill  . acetaminophen (TYLENOL) 500 MG tablet Take 1,000 mg by mouth every 6 (six) hours as needed. For pain/headache      . allopurinol (ZYLOPRIM) 100 MG tablet Take 200 mg by mouth daily.       Marland Kitchen aspirin EC 81 MG tablet Take 81 mg by mouth daily.      Marland Kitchen atorvastatin (LIPITOR) 10 MG tablet Take 10 mg by mouth daily.       . B Complex-C-Folic Acid (DIALYVITE TABLET) TABS Take 1 tablet by mouth daily.       . calcium carbonate (TUMS - DOSED IN MG ELEMENTAL CALCIUM) 500 MG chewable tablet Chew 1 tablet by mouth daily as needed. For upset stomach      . cinacalcet (SENSIPAR) 30 MG tablet Take 30 mg by mouth daily.      . colchicine (COLCRYS) 0.6 MG tablet Take 0.6 mg by mouth 2 (two) times daily as needed. for gout      . darbepoetin (ARANESP) 25 MCG/0.42ML SOLN Inject 0.42 mLs (25 mcg total) into the vein every Friday with hemodialysis.  14.28 mL  0  . gabapentin (NEURONTIN) 100 MG capsule Take 100 mg by mouth 2 (two) times daily.       . midodrine (PROAMATINE) 10 MG tablet Take 10 mg by mouth 3 (three) times a week. Take on Monday, Wednesday, and Friday with hemodialysis.      Marland Kitchen omeprazole (PRILOSEC) 40 MG capsule Take 40 mg by mouth  at bedtime. For reflux      . polyethylene glycol powder (MIRALAX) powder Take 17 g by mouth daily.        No current facility-administered medications for this visit.   Facility-Administered Medications Ordered in Other Visits  Medication Dose Route Frequency Provider Last Rate Last Dose  . albuterol (PROVENTIL) (5 MG/ML) 0.5% nebulizer solution 2.5 mg  2.5 mg Nebulization Q2H PRN Maree Krabbe, MD      . methylPREDNISolone sodium succinate (SOLU-MEDROL) 125 mg/2 mL injection 60 mg  60 mg Intravenous Once Maree Krabbe, MD        Past Medical History  Diagnosis Date  . Secondary hyperparathyroidism   . Peripheral vascular disease   . Hyperlipidemia   . Duodenal ulcer     remote  . Gout STABLE  . ESRD (end stage renal disease) NEPHROLOGIST-   Andrew [name]    Hemodialysis; Hx of bleeding from aneurysms on AVF  . Arteriosclerotic cardiovascular disease (ASCVD) 1998    CABG-1998; h/o CHF  . Cerebrovascular disease REMOTE --  PER CHART--    NO RESIDUAL  . Neuropathy of foot COLD FEET  . Degenerative joint disease   . Gastroesophageal reflux disease   .  History of bladder cancer   . Impaired hearing BILATERAL     ONLY WEARS LEFT HEARING AID  . Blood transfusion   . Anemia   . Renal cell carcinoma 2001    s/p right nephrectomy-2001; subsequent ESRD  . Hypertension   . CHF (congestive heart failure)   . Renal insufficiency   . Coronary artery disease   . Shortness of breath     when the fluid is on my lungs"    Past Surgical History  Procedure Date  . Right nephrectomy 2001    Renal cell carcinoma  . Colonoscopy 01/24/2011    prominent vascular pattern, suboptimal prep but doable  . Esophagogastroduodenoscopy 01/24/2011    mild erosive reflux esophagitis, bulbar/antral erosions, bx from antrum benign  . Av fistula repair 2008    revision of anastomosis of Right AVF  . Arteriovenous graft placement 2006    Thrombectomy and interposition jump graft revision to higher  axillary vein of LUA AVG  . Coronary artery bypass graft 1998    X4 VESSELS  . Multiple cysto/ resection tumor's with bx's LAST ONE 05-09-2011  . Multiple surg's / interventions for avgg/ fistula right upper arm LAST REVISION 06-29-2011    (JUNE 2010 STENT CEPHALIC VEIN/ 03-23-2011 DILATATION ANGIOPLASTY)  . Left ptc left renal artery   . Cardiac catheterization 2004  . Cataract extraction w/ intraocular lens  implant, bilateral   . Cystoscopy w/ retrogrades 12/28/2011    Procedure: CYSTOSCOPY WITH RETROGRADE PYELOGRAM;  Surgeon: Andrew [name], MD;  Location: [facility];  Service: Urology;  Laterality: Left;  C-ARM   . Transurethral resection of bladder tumor 12/28/2011    Procedure: TRANSURETHRAL RESECTION OF BLADDER TUMOR (TURBT);  Surgeon: Andrew [name], MD;  Location: [facility];  Service: Urology;  Laterality: N/A;  . Av fistula placement 02/29/2012    Procedure: INSERTION OF ARTERIOVENOUS (AV) GORE-TEX GRAFT THIGH;  Surgeon: Andrew Earthly, MD;  Location: [facility] OR;  Service: Vascular;  Laterality: Right;  Insertion right femoral arteriovenous gortex graft  . Ligation of right braciocephalic av fistula 04-04-2012  . Insertion of dialysis catheter 06/05/2012    Procedure: INSERTION OF DIALYSIS CATHETER;  Surgeon: Andrew Ochoa, MD;  Location: [facility] OR;  Service: Vascular;  Laterality: Left;  Insertion diatek catheter left IJ    ZOX:WRUEAV of systems complete and found to be negative unless listed above  PHYSICAL EXAM BP 122/55  Pulse 74  Ht 5\' 8"  (1.727 m)  Wt 171 lb 1.9 oz (77.62 kg)  BMI 26.02 kg/m2  General: Well developed, well nourished, in no acute distress Head: Eyes PERRLA, No xanthomas.   Normal cephalic and atramatic  Lungs: Clear bilaterally to auscultation and percussion. Heart: HRRR S1 S2, with 2/6 systolic murmur at the LSB and apex.  MRG.  Pulses are 2+ & equal.            No carotid bruit. No JVD.  No abdominal bruits. No femoral  bruits. Abdomen: Bowel sounds are positive, abdomen soft and non-tender without masses or                  Hernia's noted. Msk:  Back normal, normal gait. Normal strength and tone for age. Extremities: No clubbing, cyanosis or edema.  DP +1 Neuro: Alert and oriented X 3. Psych:  Good affect, responds appropriately    ASSESSMENT AND PLAN

## 2012-08-01 ENCOUNTER — Ambulatory Visit (INDEPENDENT_AMBULATORY_CARE_PROVIDER_SITE_OTHER): Payer: Medicare Other | Admitting: Cardiology

## 2012-08-01 ENCOUNTER — Other Ambulatory Visit: Payer: Self-pay | Admitting: Cardiology

## 2012-08-01 ENCOUNTER — Encounter: Payer: Self-pay | Admitting: Cardiology

## 2012-08-01 VITALS — BP 148/60 | HR 78 | Ht 68.0 in | Wt 171.0 lb

## 2012-08-01 DIAGNOSIS — C649 Malignant neoplasm of unspecified kidney, except renal pelvis: Secondary | ICD-10-CM

## 2012-08-01 DIAGNOSIS — I2510 Atherosclerotic heart disease of native coronary artery without angina pectoris: Secondary | ICD-10-CM

## 2012-08-01 DIAGNOSIS — R0989 Other specified symptoms and signs involving the circulatory and respiratory systems: Secondary | ICD-10-CM

## 2012-08-01 DIAGNOSIS — N186 End stage renal disease: Secondary | ICD-10-CM

## 2012-08-01 DIAGNOSIS — M79671 Pain in right foot: Secondary | ICD-10-CM

## 2012-08-01 DIAGNOSIS — G609 Hereditary and idiopathic neuropathy, unspecified: Secondary | ICD-10-CM

## 2012-08-01 DIAGNOSIS — M79609 Pain in unspecified limb: Secondary | ICD-10-CM

## 2012-08-01 DIAGNOSIS — G629 Polyneuropathy, unspecified: Secondary | ICD-10-CM

## 2012-08-01 DIAGNOSIS — I7090 Unspecified atherosclerosis: Secondary | ICD-10-CM

## 2012-08-01 DIAGNOSIS — I499 Cardiac arrhythmia, unspecified: Secondary | ICD-10-CM

## 2012-08-01 NOTE — Progress Notes (Deleted)
Name: Andrew [name]    DOB: [birth year]  Age: 78 y.o.  MR#: [id number]       PCP:  [name]., MD      Insurance: @PAYORNAME @   CC:   No chief complaint on file.   VS BP 148/60  Pulse 78  Ht 5\' 8"  (1.727 m)  Wt 171 lb (77.565 kg)  BMI 26.00 kg/m2  SpO2 96%  Weights Current Weight  08/01/12 171 lb (77.565 kg)  06/18/12 171 lb 1.9 oz (77.62 kg)  05/17/12 165 lb 5.5 oz (75 kg)    Blood Pressure  BP Readings from Last 3 Encounters:  08/01/12 148/60  06/18/12 122/55  06/05/12 149/73     Admit date:  (Not on file) Last encounter with RMR:  Visit date not found   Allergy No Known Allergies  Current Outpatient Prescriptions  Medication Sig Dispense Refill  . acetaminophen (TYLENOL) 500 MG tablet Take 1,000 mg by mouth every 6 (six) hours as needed. For pain/headache      . allopurinol (ZYLOPRIM) 100 MG tablet Take 200 mg by mouth daily.       Marland Kitchen aspirin EC 81 MG tablet Take 81 mg by mouth daily.      Marland Kitchen atorvastatin (LIPITOR) 10 MG tablet Take 10 mg by mouth daily.       . B Complex-C-Folic Acid (DIALYVITE TABLET) TABS Take 1 tablet by mouth daily.       . calcium carbonate (TUMS - DOSED IN MG ELEMENTAL CALCIUM) 500 MG chewable tablet Chew 1 tablet by mouth daily as needed. For upset stomach      . cinacalcet (SENSIPAR) 30 MG tablet Take 30 mg by mouth daily.      . colchicine (COLCRYS) 0.6 MG tablet Take 0.6 mg by mouth 2 (two) times daily as needed. for gout      . darbepoetin (ARANESP) 25 MCG/0.42ML SOLN Inject 0.42 mLs (25 mcg total) into the vein every Friday with hemodialysis.  14.28 mL  0  . gabapentin (NEURONTIN) 100 MG capsule Take 100 mg by mouth 2 (two) times daily.       . midodrine (PROAMATINE) 10 MG tablet Take 10 mg by mouth 3 (three) times a week. Take on Monday, Wednesday, and Friday with hemodialysis.      Marland Kitchen omeprazole (PRILOSEC) 40 MG capsule Take 40 mg by mouth at bedtime. For reflux      . polyethylene glycol powder (MIRALAX) powder Take 17 g by mouth  daily.        No current facility-administered medications for this visit.   Facility-Administered Medications Ordered in Other Visits  Medication Dose Route Frequency Provider Last Rate Last Dose  . albuterol (PROVENTIL) (5 MG/ML) 0.5% nebulizer solution 2.5 mg  2.5 mg Nebulization Q2H PRN Maree Krabbe, MD      . methylPREDNISolone sodium succinate (SOLU-MEDROL) 125 mg/2 mL injection 60 mg  60 mg Intravenous Once Maree Krabbe, MD        Discontinued Meds:   There are no discontinued medications.  Patient Active Problem List  Diagnosis  . SECONDARY HYPERPARATHYROIDISM  . ANEMIA  . DUODENAL ULCER, HX OF  . Hypertension  . Hyperlipidemia  . ESRD (end stage renal disease)  . Arteriosclerotic cardiovascular disease (ASCVD)  . History of bladder cancer  . Renal cell carcinoma  . Peripheral neuropathy  . Delirium  . Gait abnormality  . Chest pain on exertion  . Acute diastolic congestive heart failure  . Dyspnea  .  Clotted dialysis access    LABS Admission on 06/05/2012, Discharged on 06/05/2012  Component Date Value  . Sodium 06/05/2012 136   . Potassium 06/05/2012 5.2*  . Glucose, Bld 06/05/2012 78   . HCT 06/05/2012 28.0*  . Hemoglobin 06/05/2012 9.5*  Admission on 06/04/2012, Discharged on 06/04/2012  Component Date Value  . MRSA, PCR 06/04/2012 NEGATIVE   . Staphylococcus aureus 06/04/2012 NEGATIVE   . Sodium 06/04/2012 137   . Potassium 06/04/2012 5.9*  . Glucose, Bld 06/04/2012 87   . HCT 06/04/2012 34.0*  . Hemoglobin 06/04/2012 11.6*  Admission on 05/15/2012, Discharged on 05/17/2012  Component Date Value  . WBC 05/15/2012 3.0*  . RBC 05/15/2012 3.19*  . Hemoglobin 05/15/2012 10.1*  . HCT 05/15/2012 31.7*  . MCV 05/15/2012 99.4   . [facility] 05/15/2012 31.7   . MCHC 05/15/2012 31.9   . RDW 05/15/2012 15.9*  . Platelets 05/15/2012 74*  . MRSA by PCR 05/15/2012 NEGATIVE   . WBC 05/16/2012 2.5*  . RBC 05/16/2012 3.14*  . Hemoglobin 05/16/2012 9.9*  .  HCT 05/16/2012 30.9*  . MCV 05/16/2012 98.4   . [facility] 05/16/2012 31.5   . MCHC 05/16/2012 32.0   . RDW 05/16/2012 15.8*  . Platelets 05/16/2012 86*  . WBC 05/17/2012 6.1   . RBC 05/17/2012 2.81*  . Hemoglobin 05/17/2012 8.9*  . HCT 05/17/2012 27.8*  . MCV 05/17/2012 98.9   . [facility] 05/17/2012 31.7   . MCHC 05/17/2012 32.0   . RDW 05/17/2012 15.9*  . Platelets 05/17/2012 108*  . Sodium 05/17/2012 136   . Potassium 05/17/2012 5.1   . Chloride 05/17/2012 96   . CO2 05/17/2012 26   . Glucose, Bld 05/17/2012 113*  . BUN 05/17/2012 64*  . Creatinine, Ser 05/17/2012 8.01*  . Calcium 05/17/2012 9.6   . Phosphorus 05/17/2012 5.1*  . Albumin 05/17/2012 3.5   . GFR calc non Af Amer 05/17/2012 5*  . GFR calc Af Amer 05/17/2012 6*  Orders Only on 05/15/2012  Component Date Value  . Sodium 05/15/2012 137   . Potassium 05/15/2012 4.5   . Chloride 05/15/2012 95*  . CO2 05/15/2012 30   . Glucose, Bld 05/15/2012 90   . BUN 05/15/2012 65*  . Creatinine, Ser 05/15/2012 9.14*  . Calcium 05/15/2012 8.8   . Phosphorus 05/15/2012 4.9*  . Albumin 05/15/2012 3.5   . GFR calc non Af Amer 05/15/2012 5*  . GFR calc Af Amer 05/15/2012 [facility]  Component Date Value  . Potassium 05/15/2012 4.4   Admission on 05/10/2012, Discharged on 05/11/2012  Component Date Value  . Sodium 05/10/2012 138   . Potassium 05/10/2012 3.6   . Chloride 05/10/2012 95*  . CO2 05/10/2012 30   . Glucose, Bld 05/10/2012 79   . BUN 05/10/2012 35*  . Creatinine, Ser 05/10/2012 5.78*  . Calcium 05/10/2012 8.5   . GFR calc non Af Amer 05/10/2012 8*  . GFR calc Af Amer 05/10/2012 9*  . WBC 05/10/2012 4.5   . RBC 05/10/2012 3.03*  . Hemoglobin 05/10/2012 9.6*  . HCT 05/10/2012 30.1*  . MCV 05/10/2012 99.3   . [facility] 05/10/2012 31.7   . MCHC 05/10/2012 31.9   . RDW 05/10/2012 16.0*  . Platelets 05/10/2012 78*  . Neutrophils Relative 05/10/2012 78*  . Neutro Abs 05/10/2012 3.5   .  Lymphocytes Relative 05/10/2012 12   . Lymphs Abs 05/10/2012 0.6*  . Monocytes Relative 05/10/2012 8   . Monocytes Absolute  05/10/2012 0.4   . Eosinophils Relative 05/10/2012 2   . Eosinophils Absolute 05/10/2012 0.1   . Basophils Relative 05/10/2012 0   . Basophils Absolute 05/10/2012 0.0   Hospital Outpatient Visit on 05/09/2012  Component Date Value  . Potassium 05/09/2012 6.2*     Results for this Opt Visit:     Results for orders placed during the hospital encounter of 06/05/12  POCT I-STAT 4, (NA,K, GLUC, HGB,HCT)      Component Value Range   Sodium 136  135 - 145 mEq/L   Potassium 5.2 (*) 3.5 - 5.1 mEq/L   Glucose, Bld 78  70 - 99 mg/dL   HCT 78.2 (*) 95.6 - 21.3 %   Hemoglobin 9.5 (*) 13.0 - 17.0 g/dL    EKG Orders placed during the hospital encounter of 05/15/12  . EKG     Prior Assessment and Plan Problem List as of 08/01/2012            Cardiology Problems   Hypertension   Last Assessment & Plan Note   04/25/2012 Office Visit Signed 04/25/2012  3:07 PM by Jodelle Gross, NP    Currently well controlled on medications prescribed. No changes at this time.    Hyperlipidemia   Last Assessment & Plan Note   01/09/2012 Office Visit Signed 01/09/2012  5:26 PM by Kathlen Brunswick, MD    Laboratory study results requested from Dr. Lowell Guitar and the [facility].    Arteriosclerotic cardiovascular disease (ASCVD)   Last Assessment & Plan Note   06/18/2012 Office Visit Addendum 06/18/2012 12:42 PM by Jodelle Gross, NP    No complaints of chest pain, palpitations or significant DOE. He is doing well from cardiac standpoint on this visit. I reviewed records. He has had an echo in 02/2012 showing mild AoV stenosis and moderately thickened MV.. Will follow this in 2 years or sooner, should he become symptomatic. Labs are followed by Dr. Lowell Guitar to include cholesterol level.     Acute diastolic congestive heart failure   Last Assessment & Plan Note   06/18/2012 Office Visit  Signed 06/18/2012 12:43 PM by Jodelle Gross, NP    No evidence of fluid overload or chest congestion. He is at baseline with breathing status.  Continue to follow symptomatically.      Other   SECONDARY HYPERPARATHYROIDISM   ANEMIA   Last Assessment & Plan Note   01/09/2012 Office Visit Signed 01/09/2012  5:25 PM by Kathlen Brunswick, MD    Anemia has virtually resolved.  The patient reports treatment with medication for anemia, but it is unclear whether this represents intravenous iron or an ESA.    DUODENAL ULCER, HX OF   ESRD (end stage renal disease)   Last Assessment & Plan Note   04/25/2012 Office Visit Signed 04/25/2012  3:09 PM by Jodelle Gross, NP    New dry wt of 76.5 kg is established, but he states it may be lower as he continues to lose weight. He is not obese with a wt of 173 lbs and BMI of 26.3. This will be closely monitored by nephrology.    History of bladder cancer   Renal cell carcinoma   Peripheral neuropathy   Delirium   Gait abnormality   Chest pain on exertion   Dyspnea   Clotted dialysis access       Imaging: No results found.   FRS Calculation: Score not calculated

## 2012-08-01 NOTE — Patient Instructions (Addendum)
Your physician recommends that you schedule a follow-up appointment in: 2 weeks  Your physician has requested that you have a carotid duplex. This test is an ultrasound of the carotid arteries in your neck. It looks at blood flow through these arteries that supply the brain with blood. Allow one hour for this exam. There are no restrictions or special instructions.  Your physician has requested that you have an ankle brachial index (ABI). During this test an ultrasound and blood pressure cuff are used to evaluate the arteries that supply the arms and legs with blood. Allow thirty minutes for this exam. There are no restrictions or special instructions.

## 2012-08-01 NOTE — Assessment & Plan Note (Signed)
Hypotension during dialysis has been somewhat problematic but not apparently insurmountable.  He is treated with ProAmatine but not with any medications that are likely to cause reduced blood pressure.  Based upon his recent stress test, coronary artery disease should not be severe enough to be causing this problem.

## 2012-08-01 NOTE — Assessment & Plan Note (Signed)
Andrew [name] continues to do generally well 15 years following CABG surgery.  Stress nuclear study 4 months ago was reassuring demonstrating fairly limited inferolateral infarction without significant superimposed ischemia and with mild impairment in ejection fraction.  This suggests an acceptable cardiovascular risk for noncardiac surgery, particularly the fairly modest vascular surgery procedure that has been proposed.

## 2012-08-01 NOTE — Assessment & Plan Note (Signed)
Occurrence of lower extremity pain in conjunction with dialysis and Trendelenburg positioning suggests a possible vascular etiology.  ABIs will be obtained.

## 2012-08-01 NOTE — Progress Notes (Signed)
Patient ID: ROMA BIERLEIN, male   DOB: 25-Oct-1926, 78 y.o.   MRN: [id number]  HPI: Patient seen at the request of Vascular Surgery prior to planned creation of a new AV fistula.  Over 10 years of dialysis, he has developed occlusion of grafts in the right upper extremity, the left upper extremity and the right lower extremity most recently.  He is currently being dialyzed via a left sided central line.  He typically develops hypotension requiring prolongation of the dialysis session and Trendelenburg positioning.  He notes pain in his feet, which typically occurs following dialysis.  He feels pretty good on nondialysis days.  He has dyspnea with mild to moderate exertion but no chest discomfort.  He has had no neurologic symptoms.  Prior to Admission medications   Medication Sig Start Date End Date Taking? Authorizing Provider  acetaminophen (TYLENOL) 500 MG tablet Take 1,000 mg by mouth every 6 (six) hours as needed. For pain/headache   Yes Historical Provider, MD  allopurinol (ZYLOPRIM) 100 MG tablet Take 200 mg by mouth daily.  09/19/11  Yes Rhetta Mura, MD  aspirin EC 81 MG tablet Take 81 mg by mouth daily.   Yes Historical Provider, MD  atorvastatin (LIPITOR) 10 MG tablet Take 10 mg by mouth daily.    Yes Historical Provider, MD  B Complex-C-Folic Acid (DIALYVITE TABLET) TABS Take 1 tablet by mouth daily.  05/24/12  Yes Historical Provider, MD  calcium carbonate (TUMS - DOSED IN MG ELEMENTAL CALCIUM) 500 MG chewable tablet Chew 1 tablet by mouth daily as needed. For upset stomach   Yes Historical Provider, MD  cinacalcet (SENSIPAR) 30 MG tablet Take 30 mg by mouth daily.   Yes Historical Provider, MD  colchicine (COLCRYS) 0.6 MG tablet Take 0.6 mg by mouth 2 (two) times daily as needed. for gout   Yes Historical Provider, MD  darbepoetin (ARANESP) 25 MCG/0.42ML SOLN Inject 0.42 mLs (25 mcg total) into the vein every Friday with hemodialysis. 05/16/12  Yes Kaylyn Layer, FNP  gabapentin  (NEURONTIN) 100 MG capsule Take 100 mg by mouth 2 (two) times daily.  01/09/12 01/08/13 Yes Kathlen Brunswick, MD  midodrine (PROAMATINE) 10 MG tablet Take 10 mg by mouth 3 (three) times a week. Take on Monday, Wednesday, and Friday with hemodialysis. 05/17/12  Yes Historical Provider, MD  omeprazole (PRILOSEC) 40 MG capsule Take 40 mg by mouth at bedtime. For reflux   Yes Historical Provider, MD  polyethylene glycol powder (MIRALAX) powder Take 17 g by mouth daily.    Yes Historical Provider, MD   No Known Allergies    Past medical history, social history, and family history reviewed and updated.  ROS: Denies orthopnea, PND, palpitations, lightheadedness or syncope.  No mental clouding that he can appreciate.  Ambulation limited by chronic left knee pain.  All other systems reviewed and are negative.  PHYSICAL EXAM: BP 148/60  Pulse 78  Ht 5\' 8"  (1.727 m)  Wt 77.565 kg (171 lb)  BMI 26.00 kg/m2  SpO2 96%  General-Well developed; no acute distress Body habitus-proportionate weight and height Neck-No JVD; no carotid bruits Lungs-clear lung fields; resonant to percussion Cardiovascular-normal PMI; normal S1 and S2; frequent prematures; 2/6 systolic ejection murmur Abdomen-normal bowel sounds; soft and non-tender without masses; liver edge palpable 2 cm below the right costal margin Musculoskeletal-No deformities, no cyanosis or clubbing Neurologic-Normal cranial nerves; symmetric strength and tone Skin-Warm, no significant lesions Extremities-distal pulses intact; no edema  Rhythm Strip: Normal sinus rhythm, intra-atrial  conduction delay, PACs, PVCs  ASSESSMENT AND PLAN:  [place] Bing, MD 08/01/2012 12:21 PM

## 2012-08-06 ENCOUNTER — Telehealth: Payer: Self-pay | Admitting: Physician Assistant

## 2012-08-06 ENCOUNTER — Encounter: Payer: Self-pay | Admitting: Cardiology

## 2012-08-06 ENCOUNTER — Encounter (HOSPITAL_COMMUNITY): Payer: Self-pay | Admitting: *Deleted

## 2012-08-06 ENCOUNTER — Emergency Department (HOSPITAL_COMMUNITY)
Admission: EM | Admit: 2012-08-06 | Discharge: 2012-08-06 | Disposition: A | Discharge status: Home / Self Care | Payer: Medicare Other | Attending: Emergency Medicine | Admitting: Emergency Medicine

## 2012-08-06 ENCOUNTER — Ambulatory Visit (HOSPITAL_COMMUNITY)
Admission: RE | Admit: 2012-08-06 | Discharge: 2012-08-06 | Disposition: A | Discharge status: Home / Self Care | Payer: Medicare Other | Source: Ambulatory Visit | Attending: Cardiology | Admitting: Cardiology

## 2012-08-06 ENCOUNTER — Other Ambulatory Visit: Payer: Self-pay

## 2012-08-06 ENCOUNTER — Encounter: Payer: Self-pay | Admitting: *Deleted

## 2012-08-06 DIAGNOSIS — I739 Peripheral vascular disease, unspecified: Secondary | ICD-10-CM | POA: Insufficient documentation

## 2012-08-06 DIAGNOSIS — M79609 Pain in unspecified limb: Secondary | ICD-10-CM | POA: Insufficient documentation

## 2012-08-06 DIAGNOSIS — I120 Hypertensive chronic kidney disease with stage 5 chronic kidney disease or end stage renal disease: Secondary | ICD-10-CM | POA: Insufficient documentation

## 2012-08-06 DIAGNOSIS — M79671 Pain in right foot: Secondary | ICD-10-CM

## 2012-08-06 DIAGNOSIS — I6529 Occlusion and stenosis of unspecified carotid artery: Secondary | ICD-10-CM | POA: Insufficient documentation

## 2012-08-06 DIAGNOSIS — Z87891 Personal history of nicotine dependence: Secondary | ICD-10-CM | POA: Insufficient documentation

## 2012-08-06 DIAGNOSIS — R0989 Other specified symptoms and signs involving the circulatory and respiratory systems: Secondary | ICD-10-CM | POA: Insufficient documentation

## 2012-08-06 DIAGNOSIS — Z8379 Family history of other diseases of the digestive system: Secondary | ICD-10-CM | POA: Insufficient documentation

## 2012-08-06 DIAGNOSIS — K219 Gastro-esophageal reflux disease without esophagitis: Secondary | ICD-10-CM | POA: Insufficient documentation

## 2012-08-06 DIAGNOSIS — N186 End stage renal disease: Secondary | ICD-10-CM | POA: Insufficient documentation

## 2012-08-06 DIAGNOSIS — I10 Essential (primary) hypertension: Secondary | ICD-10-CM | POA: Insufficient documentation

## 2012-08-06 DIAGNOSIS — Z8249 Family history of ischemic heart disease and other diseases of the circulatory system: Secondary | ICD-10-CM | POA: Insufficient documentation

## 2012-08-06 DIAGNOSIS — I679 Cerebrovascular disease, unspecified: Secondary | ICD-10-CM | POA: Insufficient documentation

## 2012-08-06 DIAGNOSIS — I658 Occlusion and stenosis of other precerebral arteries: Secondary | ICD-10-CM | POA: Insufficient documentation

## 2012-08-06 DIAGNOSIS — Z800 Family history of malignant neoplasm of digestive organs: Secondary | ICD-10-CM | POA: Insufficient documentation

## 2012-08-06 DIAGNOSIS — E119 Type 2 diabetes mellitus without complications: Secondary | ICD-10-CM | POA: Insufficient documentation

## 2012-08-06 DIAGNOSIS — I82890 Acute embolism and thrombosis of other specified veins: Secondary | ICD-10-CM | POA: Insufficient documentation

## 2012-08-06 DIAGNOSIS — Z8489 Family history of other specified conditions: Secondary | ICD-10-CM | POA: Insufficient documentation

## 2012-08-06 DIAGNOSIS — M79672 Pain in left foot: Secondary | ICD-10-CM

## 2012-08-06 DIAGNOSIS — F17200 Nicotine dependence, unspecified, uncomplicated: Secondary | ICD-10-CM | POA: Insufficient documentation

## 2012-08-06 DIAGNOSIS — I82C19 Acute embolism and thrombosis of unspecified internal jugular vein: Secondary | ICD-10-CM | POA: Insufficient documentation

## 2012-08-06 NOTE — Telephone Encounter (Signed)
Telephone Call with Dr. Arlean Hopping of nephrology regarding [name], is a 78 y.o. male,   MRN: [id number]  -  DOB - [birth year]  Outpatient Primary MD for the patient is [name]., MD   78 yo gentleman with ESRD on HD with very limited access left.  He was in Dr. Marvel Plan office receiving a pre-op evaluation in preparation for possible access placement in his thigh.  Dr. Dietrich Pates found a right carotid bruit and subsequently an Acute DVT in the right IJ.  The patient was also noted to have a fever of 104 degrees in hemo dialysis yesterday.  We agreed to send the patient thru the [facility] ED for admission for Acute DVT and likely infection.   Algis Downs, PA-C [facility] Pager: [phone]

## 2012-08-06 NOTE — ED Notes (Signed)
The pt is a dialysis pt that was dialyzed yesterday and he has had an elevated temp .  No neck pain.  Today it was discovered that he has a clot in the vein in his rt neck for maybe surgery

## 2012-08-06 NOTE — ED Provider Notes (Signed)
History     CSN: [id number]  Arrival date & time 08/06/12  1533   First MD Initiated Contact with Patient 08/06/12 1611      Chief Complaint  Patient presents with  . blood clot in neck     (Consider location/radiation/quality/duration/timing/severity/associated sxs/prior treatment) The history is provided by the patient.   78 year old male was referred here by his cardiologist because when he had an ultrasound of his neck, a clot was found. The scan was done because of carotid bruits. Also, he had a high fever yesterday at dialysis. Temperature was over 104. He was given to antibiotics after dialysis, and he has not had a fever today. His daughter states that yesterday he was weaker than normal, but today he seems to be back to his baseline. He denies cough, nausea, vomiting, diarrhea.  Past Medical History  Diagnosis Date  . Secondary hyperparathyroidism   . Peripheral vascular disease   . Hyperlipidemia   . Duodenal ulcer     remote  . Gout STABLE  . ESRD (end stage renal disease)     Hemodialysis since 2003; Nephrologist-Dr. Caryn Section  . Arteriosclerotic cardiovascular disease (ASCVD) 1998    CABG-1998; h/o CHF; Atherosclerosis of great vessels, aorta and coronaries on CT in 02/2012  . Cerebrovascular disease   . Neuropathy of foot   . Degenerative joint disease   . Gastroesophageal reflux disease   . History of bladder cancer   . Impaired hearing     Bilateral; hearing aids relatively ineffective  . Anemia     Prior blood transfusion  . Renal cell carcinoma 2001    s/p right nephrectomy-2001; subsequent ESRD  . Hypertension   . Cholelithiasis 03/2011    Diagnosed incidentally on abdominal ultrasound in 03/2011  . Hepatic steatosis     Past Surgical History  Procedure Date  . Right nephrectomy 2001    Renal cell carcinoma  . Colonoscopy 01/24/2011    prominent vascular pattern, suboptimal prep but doable  . Esophagogastroduodenoscopy 01/24/2011    mild erosive reflux  esophagitis, bulbar/antral erosions, bx from antrum benign  . Av fistula repair 2008    revision of anastomosis of Right AVF  . Arteriovenous graft placement 2006    Thrombectomy and interposition jump graft revision to higher axillary vein of LUA AVG  . Coronary artery bypass graft 1998    X4 VESSELS  . Multiple cysto/ resection tumor's with bx's LAST ONE 05-09-2011  . Multiple surg's / interventions for avgg/ fistula right upper arm LAST REVISION 06-29-2011    (JUNE 2010 STENT CEPHALIC VEIN/ 03-23-2011 DILATATION ANGIOPLASTY)  . Left ptc left renal artery   . Cardiac catheterization 2004  . Cataract extraction w/ intraocular lens  implant, bilateral   . Cystoscopy w/ retrogrades 12/28/2011    Procedure: CYSTOSCOPY WITH RETROGRADE PYELOGRAM;  Surgeon: [name], MD;  Location: [facility];  Service: Urology;  Laterality: Left;  C-ARM   . Transurethral resection of bladder tumor 12/28/2011    Procedure: TRANSURETHRAL RESECTION OF BLADDER TUMOR (TURBT);  Surgeon: [name], MD;  Location: [facility];  Service: Urology;  Laterality: N/A;  . Av fistula placement 02/29/2012    Procedure: INSERTION OF ARTERIOVENOUS (AV) GORE-TEX GRAFT THIGH;  Surgeon: [name], MD;  Location: [facility] OR;  Service: Vascular;  Laterality: Right;  Insertion right femoral arteriovenous gortex graft  . Ligation of right braciocephalic av fistula 04-04-2012  . Insertion of dialysis catheter 06/05/2012    Procedure:  INSERTION OF DIALYSIS CATHETER;  Surgeon: Pryor Ochoa, MD;  Location: [facility] OR;  Service: Vascular;  Laterality: Left;  Insertion diatek catheter left IJ    Family History  Problem Relation Age of Onset  . Colon cancer Neg Hx   . Liver disease Neg Hx   . Anesthesia problems Neg Hx   . Heart disease Father     History  Substance Use Topics  . Smoking status: Former Smoker -- 1.0 packs/day for 25 years    Types: Cigarettes    Quit date: 02/26/1984  . Smokeless  tobacco: Never Used   Comment: quit 1985  . Alcohol Use: No      Review of Systems  All other systems reviewed and are negative.    Allergies  Review of patient's allergies indicates no known allergies.  Home Medications   Current Outpatient Rx  Name Route Sig Dispense Refill  . ACETAMINOPHEN 500 MG PO TABS Oral Take 1,000 mg by mouth every 6 (six) hours as needed. For pain/headache    . SEVELAMER CARBONATE 800 MG PO TABS Oral Take 800 mg by mouth 3 (three) times daily with meals.    . ALLOPURINOL 100 MG PO TABS Oral Take 200 mg by mouth daily.     . ASPIRIN EC 81 MG PO TBEC Oral Take 81 mg by mouth daily.    . ATORVASTATIN CALCIUM 10 MG PO TABS Oral Take 10 mg by mouth daily.     Marland Kitchen DIALYVITE PO TABS Oral Take 1 tablet by mouth daily.     Marland Kitchen CALCIUM CARBONATE ANTACID 500 MG PO CHEW Oral Chew 1 tablet by mouth daily as needed. For upset stomach    . CINACALCET HCL 30 MG PO TABS Oral Take 30 mg by mouth daily.    . COLCHICINE 0.6 MG PO TABS Oral Take 0.6 mg by mouth 2 (two) times daily as needed. for gout    . GABAPENTIN 100 MG PO CAPS Oral Take 100 mg by mouth 2 (two) times daily.     Marland Kitchen OMEPRAZOLE 40 MG PO CPDR Oral Take 40 mg by mouth at bedtime. For reflux    . POLYETHYLENE GLYCOL 3350 PO POWD Oral Take 17 g by mouth daily.       BP 132/51  Pulse 80  Temp 97.8 F (36.6 C) (Oral)  Resp 18  SpO2 100%  Physical Exam  Nursing note and vitals reviewed. 78year old male, resting comfortably and in no acute distress. Vital signs are normal. Oxygen saturation is 100%, which is normal. Head is normocephalic and atraumatic.  Pupils are about 2 mm and unreactive, EOMI. slight ptosis is noted of the right eye which his daughter states is chronic Oropharynx is clear. Neck is nontender and supple without adenopathy or JVD. Bilateral carotid bruits are heard. Back is nontender and there is no CVA tenderness. Lungs are clear without rales, wheezes, or rhonchi. Chest is nontender.  Dialysis access catheters are present in the left subclavian area. Heart has regular rate and rhythm with 1-2/6 systolic ejection murmur heard at the upper right sternal border and also at the cardiac apex. This murmur does not appear to radiate to the neck, and carotid bruit seems to be separate from the murmur. Abdomen is soft, flat, nontender without masses or hepatosplenomegaly and peristalsis is normoactive. Extremities have no cyanosis or edema, full range of motion is present. Skin is warm and dry without rash. Neurologic: Mental status is normal, cranial nerves are intact, there are  no motor or sensory deficits.   ED Course  Procedures (including critical care time)  Labs Reviewed - No data to display US Carotid Duplex Bilateral  08/06/2012  *RADIOLOGY REPORT*  Clinical Data: Carotid bruit.  Previous stroke.  Hypertension, coronary artery disease, diabetes, tobacco use.  BILATERAL CAROTID DUPLEX ULTRASOUND  Technique: Wallace Cullens scale imaging, color Doppler and duplex ultrasound was performed of bilateral carotid and vertebral arteries in the neck.  Comparison: None available  Criteria:  Quantification of carotid stenosis is based on velocity parameters that correlate the residual internal carotid diameter with NASCET-based stenosis levels, using the diameter of the distal internal carotid lumen as the denominator for stenosis measurement.  The following velocity measurements were obtained:                   PEAK SYSTOLIC/END DIASTOLIC RIGHT ICA:                        278/66cm/sec CCA:                        142/24cm/sec SYSTOLIC ICA/CCA RATIO:     1.95 DIASTOLIC ICA/CCA RATIO:    2.75 ECA:                        182cm/sec  LEFT ICA:                        134/24cm/sec CCA:                        126/28cm/sec SYSTOLIC ICA/CCA RATIO:     1.06 DIASTOLIC ICA/CCA RATIO:    0.86 ECA:                        145cm/sec  Findings:  RIGHT CAROTID ARTERY: Thrombus in the right IJ vein is noted. There is  irregular calcified plaque effacing the carotid bulb and extending into proximal internal and external carotid arteries resulting in at least mild stenosis.  There are elevated peak systolic velocities in the proximal ICA.  RIGHT VERTEBRAL ARTERY:  Normal flow direction and waveform.  LEFT CAROTID ARTERY: Eccentric nonocclusive plaque scattered through the common carotid artery.  There is more heavily calcified eccentric circumferential plaque in the carotid bulb extending into proximal internal and external carotid arteries resulting in at least mild stenosis.  Mildly elevated peak systolic velocities recorded.  Normal color Doppler signal.  LEFT VERTEBRAL ARTERY:  Normal flow direction and waveform.  IMPRESSION:  1.  Bilateral carotid bifurcation and proximal ICA plaque, resulting in 50-69% diameter stenosis on the right, less than 50% diameter stenosis on the left. The exam does not exclude plaque ulceration or embolization.  Continued surveillance recommended. 2.  Right IJ thrombus is incidentally noted.   Original Report Authenticated By: Osa Craver, M.D.    US Arterial Seg Single  08/06/2012  *RADIOLOGY REPORT*  Clinical Data: Bilateral foot pain  ULTRASOUND ANKLE/BRACHIAL INDICES BILATERAL  Comparison: None.  Findings: Right and left ankle brachial indices are low at 0.85 and 0.71.  Right posterior tibial wave form is biphasic.  The right dorsalis pedis, left posterior tibial, and left dorsalis pedis are monophasic.  IMPRESSION: Moderate right lower extremity and severe left lower extremity arterial occlusive disease.  CT angiogram is recommended to further characterize.   Original Report Authenticated By: Merton Border  Joseph Art, M.D.      1. Thrombosis of internal jugular vein       MDM  Nonocclusive clot in the right internal jugular vein as an incidental finding noted at the time of carotid duplex scanning. This does not appear to be related to his dialysis catheter which is in the left  subclavian vein. Case will be discussed with nephrology and possibly with vascular surgery to see if it can be managed with anticoagulation as an outpatient or whether he needs to start with inpatient management. Fever yesterday which seems to resolved and is currently on antibiotics given at the time of dialysis.  Case is discussed with Dr. Trevor Iha, and the patient apparently has had prior catheters in his right internal jugular vein, and the feeling is that this thrombus is most likely related to the prior catheter. This is not at risk for embolization and does not require anticoagulation. Therefore, the patient will be discharged.     Dione Booze, MD 08/06/12 [phone]

## 2012-08-06 NOTE — ED Notes (Signed)
MD at bedside. 

## 2012-08-14 ENCOUNTER — Ambulatory Visit (HOSPITAL_COMMUNITY)
Admission: RE | Admit: 2012-08-14 | Discharge: 2012-08-14 | Disposition: A | Discharge status: Home / Self Care | Payer: Medicare Other | Source: Ambulatory Visit | Attending: Nephrology | Admitting: Nephrology

## 2012-08-14 ENCOUNTER — Other Ambulatory Visit (HOSPITAL_COMMUNITY): Payer: Self-pay | Admitting: Nephrology

## 2012-08-14 DIAGNOSIS — W19XXXA Unspecified fall, initial encounter: Secondary | ICD-10-CM | POA: Insufficient documentation

## 2012-08-14 DIAGNOSIS — S199XXA Unspecified injury of neck, initial encounter: Secondary | ICD-10-CM | POA: Insufficient documentation

## 2012-08-14 DIAGNOSIS — S0993XA Unspecified injury of face, initial encounter: Secondary | ICD-10-CM | POA: Insufficient documentation

## 2012-08-14 DIAGNOSIS — R51 Headache: Secondary | ICD-10-CM | POA: Insufficient documentation

## 2012-08-15 ENCOUNTER — Ambulatory Visit (INDEPENDENT_AMBULATORY_CARE_PROVIDER_SITE_OTHER): Payer: Medicare Other | Admitting: Cardiology

## 2012-08-15 ENCOUNTER — Encounter: Payer: Self-pay | Admitting: Cardiology

## 2012-08-15 VITALS — BP 130/58 | HR 83 | Ht 68.0 in | Wt 172.0 lb

## 2012-08-15 DIAGNOSIS — I82890 Acute embolism and thrombosis of other specified veins: Secondary | ICD-10-CM

## 2012-08-15 NOTE — Progress Notes (Signed)
Patient ID: Andrew [name], male   DOB: 1926/05/13, 78 y.o.   MRN: [id number]  HPI: Patient was recently seen for evaluation prior to planned AV fistula.  Carotid ultrasound identified right IJ thrombus.  Nephrology contacted and advised transfer to [facility] where a different nephrologist discussed case with vascular surgery and advised discharge without treatment.  Patient remains asymptomatic.    Prior to Admission medications   Medication Sig Start Date End Date Taking? Authorizing Provider  acetaminophen (TYLENOL) 500 MG tablet Take 1,000 mg by mouth every 6 (six) hours as needed. For pain/headache   Yes Historical Provider, MD  allopurinol (ZYLOPRIM) 100 MG tablet Take 100 mg by mouth at bedtime.  09/19/11  Yes Rhetta Mura, MD  aspirin EC 81 MG tablet Take 81 mg by mouth at bedtime.    Yes Historical Provider, MD  atorvastatin (LIPITOR) 10 MG tablet Take 10 mg by mouth every morning.    Yes Historical Provider, MD  B Complex-C-Folic Acid (DIALYVITE TABLET) TABS Take 1 tablet by mouth at bedtime.  05/24/12  Yes Historical Provider, MD  calcium carbonate (TUMS - DOSED IN MG ELEMENTAL CALCIUM) 500 MG chewable tablet Chew 1 tablet by mouth daily as needed. For upset stomach   Yes Historical Provider, MD  cinacalcet (SENSIPAR) 30 MG tablet Take 30 mg by mouth daily with breakfast.    Yes Historical Provider, MD  colchicine (COLCRYS) 0.6 MG tablet Take 0.6 mg by mouth 2 (two) times daily as needed. for gout   Yes Historical Provider, MD  gabapentin (NEURONTIN) 100 MG capsule Take 100 mg by mouth 2 (two) times daily.  01/09/12 01/08/13 Yes Kathlen Brunswick, MD  omeprazole (PRILOSEC) 40 MG capsule Take 40 mg by mouth at bedtime. For reflux   Yes Historical Provider, MD  polyethylene glycol powder (MIRALAX) powder Take 17 g by mouth at bedtime.    Yes Historical Provider, MD  sevelamer (RENVELA) 800 MG tablet Take 1,600-3,200 mg by mouth 5 (five) times daily. 4 tabs 3 x daily  and 2 with snack   Yes Historical Provider, MD   No Known Allergies    Past medical history, social history, and family history reviewed and updated.  ROS: Patient reports generally good exercise tolerance, but occasionally notes mild dyspnea with wheezing.  PHYSICAL EXAM: BP 130/58  Pulse 83  Ht 5\' 8"  (1.727 m)  Wt 78.019 kg (172 lb)  BMI 26.15 kg/m2  SpO2 99%  General-Well developed; no acute distress Body habitus-proportionate weight and height Neck-No JVD; no carotid bruits Lungs-clear lung fields; resonant to percussion Cardiovascular-normal PMI; normal S1 and S2; basilar systolic ejection murmur Abdomen-normal bowel sounds; soft and non-tender without masses or organomegaly Musculoskeletal-No deformities, no cyanosis or clubbing Neurologic-Normal cranial nerves; symmetric strength and tone Skin-Warm, no significant lesions Extremities-distal pulses intact; no edema  ASSESSMENT AND PLAN:  Andrew Bing, MD 08/15/2012 5:00 PM

## 2012-08-15 NOTE — Patient Instructions (Addendum)
Your physician recommends that you schedule a follow-up appointment in: As planned  

## 2012-08-15 NOTE — Progress Notes (Deleted)
Name: Andrew [name]    DOB: [birth year]  Age: 78 y.o.  MR#: [id number]       PCP:  [name]., MD      Insurance: @PAYORNAME @   CC:   No chief complaint on file.   VS BP 130/58  Pulse 83  Ht 5\' 8"  (1.727 m)  Wt 172 lb (78.019 kg)  BMI 26.15 kg/m2  SpO2 99%  Weights Current Weight  08/15/12 172 lb (78.019 kg)  08/01/12 171 lb (77.565 kg)  06/18/12 171 lb 1.9 oz (77.62 kg)    Blood Pressure  BP Readings from Last 3 Encounters:  08/15/12 130/58  08/06/12 129/67  08/01/12 148/60     Admit date:  (Not on file) Last encounter with RMR:  08/06/2012   Allergy No Known Allergies  Current Outpatient Prescriptions  Medication Sig Dispense Refill  . acetaminophen (TYLENOL) 500 MG tablet Take 1,000 mg by mouth every 6 (six) hours as needed. For pain/headache      . allopurinol (ZYLOPRIM) 100 MG tablet Take 100 mg by mouth at bedtime.       Marland Kitchen aspirin EC 81 MG tablet Take 81 mg by mouth at bedtime.       Marland Kitchen atorvastatin (LIPITOR) 10 MG tablet Take 10 mg by mouth every morning.       . B Complex-C-Folic Acid (DIALYVITE TABLET) TABS Take 1 tablet by mouth at bedtime.       . calcium carbonate (TUMS - DOSED IN MG ELEMENTAL CALCIUM) 500 MG chewable tablet Chew 1 tablet by mouth daily as needed. For upset stomach      . cinacalcet (SENSIPAR) 30 MG tablet Take 30 mg by mouth daily with breakfast.       . colchicine (COLCRYS) 0.6 MG tablet Take 0.6 mg by mouth 2 (two) times daily as needed. for gout      . gabapentin (NEURONTIN) 100 MG capsule Take 100 mg by mouth 2 (two) times daily.       Marland Kitchen omeprazole (PRILOSEC) 40 MG capsule Take 40 mg by mouth at bedtime. For reflux      . polyethylene glycol powder (MIRALAX) powder Take 17 g by mouth at bedtime.       . sevelamer (RENVELA) 800 MG tablet Take 1,600-3,200 mg by mouth 5 (five) times daily. 4 tabs 3 x daily and 2 with snack       No current facility-administered medications for this visit.   Facility-Administered Medications Ordered  in Other Visits  Medication Dose Route Frequency Provider Last Rate Last Dose  . albuterol (PROVENTIL) (5 MG/ML) 0.5% nebulizer solution 2.5 mg  2.5 mg Nebulization Q2H PRN Maree Krabbe, MD      . methylPREDNISolone sodium succinate (SOLU-MEDROL) 125 mg/2 mL injection 60 mg  60 mg Intravenous Once Maree Krabbe, MD        Discontinued Meds:   There are no discontinued medications.  Patient Active Problem List  Diagnosis  . SECONDARY HYPERPARATHYROIDISM  . ANEMIA  . Hypertension  . Hyperlipidemia  . ESRD (end stage renal disease)  . Arteriosclerotic cardiovascular disease (ASCVD)  . Renal cell carcinoma  . Peripheral neuropathy  . Peripheral vascular disease  . Cerebrovascular disease  . Jugular vein thrombosis, right    LABS Admission on 06/05/2012, Discharged on 06/05/2012  Component Date Value  . Sodium 06/05/2012 136   . Potassium 06/05/2012 5.2*  . Glucose, Bld 06/05/2012 78   . HCT 06/05/2012 28.0*  . Hemoglobin 06/05/2012 9.5*  Admission on 06/04/2012, Discharged on 06/04/2012  Component Date Value  . MRSA, PCR 06/04/2012 NEGATIVE   . Staphylococcus aureus 06/04/2012 NEGATIVE   . Sodium 06/04/2012 137   . Potassium 06/04/2012 5.9*  . Glucose, Bld 06/04/2012 87   . HCT 06/04/2012 34.0*  . Hemoglobin 06/04/2012 11.6*  Admission on 05/15/2012, Discharged on 05/17/2012  Component Date Value  . WBC 05/15/2012 3.0*  . RBC 05/15/2012 3.19*  . Hemoglobin 05/15/2012 10.1*  . HCT 05/15/2012 31.7*  . MCV 05/15/2012 99.4   . [facility] 05/15/2012 31.7   . MCHC 05/15/2012 31.9   . RDW 05/15/2012 15.9*  . Platelets 05/15/2012 74*  . MRSA by PCR 05/15/2012 NEGATIVE   . WBC 05/16/2012 2.5*  . RBC 05/16/2012 3.14*  . Hemoglobin 05/16/2012 9.9*  . HCT 05/16/2012 30.9*  . MCV 05/16/2012 98.4   . [facility] 05/16/2012 31.5   . MCHC 05/16/2012 32.0   . RDW 05/16/2012 15.8*  . Platelets 05/16/2012 86*  . WBC 05/17/2012 6.1   . RBC 05/17/2012 2.81*  . Hemoglobin 05/17/2012  8.9*  . HCT 05/17/2012 27.8*  . MCV 05/17/2012 98.9   . [facility] 05/17/2012 31.7   . MCHC 05/17/2012 32.0   . RDW 05/17/2012 15.9*  . Platelets 05/17/2012 108*  . Sodium 05/17/2012 136   . Potassium 05/17/2012 5.1   . Chloride 05/17/2012 96   . CO2 05/17/2012 26   . Glucose, Bld 05/17/2012 113*  . BUN 05/17/2012 64*  . Creatinine, Ser 05/17/2012 8.01*  . Calcium 05/17/2012 9.6   . Phosphorus 05/17/2012 5.1*  . Albumin 05/17/2012 3.5   . GFR calc non Af Amer 05/17/2012 5*  . GFR calc Af Amer 05/17/2012 6*     Results for this Opt Visit:     Results for orders placed during the hospital encounter of 06/05/12  POCT I-STAT 4, (NA,K, GLUC, HGB,HCT)      Component Value Range   Sodium 136  135 - 145 mEq/L   Potassium 5.2 (*) 3.5 - 5.1 mEq/L   Glucose, Bld 78  70 - 99 mg/dL   HCT 40.9 (*) 81.1 - 91.4 %   Hemoglobin 9.5 (*) 13.0 - 17.0 g/dL    EKG Orders placed in visit on 08/06/12  . EKG 12-LEAD     Prior Assessment and Plan Problem List as of 08/15/2012            Cardiology Problems   Hypertension   Last Assessment & Plan Note   04/25/2012 Office Visit Signed 04/25/2012  3:07 PM by Jodelle Gross, NP    Currently well controlled on medications prescribed. No changes at this time.    Hyperlipidemia   Last Assessment & Plan Note   01/09/2012 Office Visit Signed 01/09/2012  5:26 PM by Kathlen Brunswick, MD    Laboratory study results requested from Dr. Lowell Guitar and the [facility].    Arteriosclerotic cardiovascular disease (ASCVD)   Last Assessment & Plan Note   08/01/2012 Office Visit Signed 08/01/2012 12:30 PM by Kathlen Brunswick, MD    Mr. Labrosse continues to do generally well 15 years following CABG surgery.  Stress nuclear study 4 months ago was reassuring demonstrating fairly limited inferolateral infarction without significant superimposed ischemia and with mild impairment in ejection fraction.  This suggests an acceptable cardiovascular risk for noncardiac surgery,  particularly the fairly modest vascular surgery procedure that has been proposed.    Peripheral vascular disease   Cerebrovascular disease   Jugular vein thrombosis,  right     Other   SECONDARY HYPERPARATHYROIDISM   ANEMIA   Last Assessment & Plan Note   01/09/2012 Office Visit Signed 01/09/2012  5:25 PM by Kathlen Brunswick, MD    Anemia has virtually resolved.  The patient reports treatment with medication for anemia, but it is unclear whether this represents intravenous iron or an ESA.    ESRD (end stage renal disease)   Last Assessment & Plan Note   08/01/2012 Office Visit Signed 08/01/2012 12:32 PM by Kathlen Brunswick, MD    Hypotension during dialysis has been somewhat problematic but not apparently insurmountable.  He is treated with ProAmatine but not with any medications that are likely to cause reduced blood pressure.  Based upon his recent stress test, coronary artery disease should not be severe enough to be causing this problem.    Renal cell carcinoma   Peripheral neuropathy   Last Assessment & Plan Note   08/01/2012 Office Visit Signed 08/01/2012 12:33 PM by Kathlen Brunswick, MD    Occurrence of lower extremity pain in conjunction with dialysis and Trendelenburg positioning suggests a possible vascular etiology.  ABIs will be obtained.        Imaging: Ct Head Wo Contrast  08/14/2012  *RADIOLOGY REPORT*  Clinical Data:  Larey Seat and hit face.  Head injury.  CT HEAD WITHOUT CONTRAST CT MAXILLOFACIAL WITHOUT CONTRAST  Technique:  Multidetector CT imaging of the head and maxillofacial structures were performed using the standard protocol without intravenous contrast. Multiplanar CT image reconstructions of the maxillofacial structures were also generated.  Comparison:  CT head 03/01/2012  CT HEAD  Findings: Generalized atrophy.  Mild chronic microvascular ischemic changes in the white matter, unchanged.  No acute infarct. Negative for intracranial hemorrhage.  Negative for mass or edema.  Calvarium is intact.  IMPRESSION: Atrophy and chronic microvascular ischemia.  No acute abnormality.  CT MAXILLOFACIAL  Findings:   Negative for facial fracture.  No fracture of the orbit or nasal bone is identified.  Zygomatic arch is intact. No blood in the sinuses.  Mild mucosal edema in the paranasal sinuses.  No soft tissue mass or hematoma. Degenerative changes at C1-2 with cystic change in the dens.  Carotid artery calcification bilaterally.  IMPRESSION: Negative for fracture.   Original Report Authenticated By: Camelia Phenes, M.D.    US Carotid Duplex Bilateral  08/06/2012  *RADIOLOGY REPORT*  Clinical Data: Carotid bruit.  Previous stroke.  Hypertension, coronary artery disease, diabetes, tobacco use.  BILATERAL CAROTID DUPLEX ULTRASOUND  Technique: Wallace Cullens scale imaging, color Doppler and duplex ultrasound was performed of bilateral carotid and vertebral arteries in the neck.  Comparison: None available  Criteria:  Quantification of carotid stenosis is based on velocity parameters that correlate the residual internal carotid diameter with NASCET-based stenosis levels, using the diameter of the distal internal carotid lumen as the denominator for stenosis measurement.  The following velocity measurements were obtained:                   PEAK SYSTOLIC/END DIASTOLIC RIGHT ICA:                        278/66cm/sec CCA:                        142/24cm/sec SYSTOLIC ICA/CCA RATIO:     1.95 DIASTOLIC ICA/CCA RATIO:    2.75 ECA:  182cm/sec  LEFT ICA:                        134/24cm/sec CCA:                        126/28cm/sec SYSTOLIC ICA/CCA RATIO:     1.06 DIASTOLIC ICA/CCA RATIO:    0.86 ECA:                        145cm/sec  Findings:  RIGHT CAROTID ARTERY: Thrombus in the right IJ vein is noted. There is irregular calcified plaque effacing the carotid bulb and extending into proximal internal and external carotid arteries resulting in at least mild stenosis.  There are elevated peak  systolic velocities in the proximal ICA.  RIGHT VERTEBRAL ARTERY:  Normal flow direction and waveform.  LEFT CAROTID ARTERY: Eccentric nonocclusive plaque scattered through the common carotid artery.  There is more heavily calcified eccentric circumferential plaque in the carotid bulb extending into proximal internal and external carotid arteries resulting in at least mild stenosis.  Mildly elevated peak systolic velocities recorded.  Normal color Doppler signal.  LEFT VERTEBRAL ARTERY:  Normal flow direction and waveform.  IMPRESSION:  1.  Bilateral carotid bifurcation and proximal ICA plaque, resulting in 50-69% diameter stenosis on the right, less than 50% diameter stenosis on the left. The exam does not exclude plaque ulceration or embolization.  Continued surveillance recommended. 2.  Right IJ thrombus is incidentally noted.   Original Report Authenticated By: Osa Craver, M.D.    US Arterial Seg Single  08/06/2012  *RADIOLOGY REPORT*  Clinical Data: Bilateral foot pain  ULTRASOUND ANKLE/BRACHIAL INDICES BILATERAL  Comparison: None.  Findings: Right and left ankle brachial indices are low at 0.85 and 0.71.  Right posterior tibial wave form is biphasic.  The right dorsalis pedis, left posterior tibial, and left dorsalis pedis are monophasic.  IMPRESSION: Moderate right lower extremity and severe left lower extremity arterial occlusive disease.  CT angiogram is recommended to further characterize.   Original Report Authenticated By: Donavan Burnet, M.D.    Ct Maxillofacial Wo Cm  08/14/2012  *RADIOLOGY REPORT*  Clinical Data:  Larey Seat and hit face.  Head injury.  CT HEAD WITHOUT CONTRAST CT MAXILLOFACIAL WITHOUT CONTRAST  Technique:  Multidetector CT imaging of the head and maxillofacial structures were performed using the standard protocol without intravenous contrast. Multiplanar CT image reconstructions of the maxillofacial structures were also generated.  Comparison:  CT head 03/01/2012  CT HEAD   Findings: Generalized atrophy.  Mild chronic microvascular ischemic changes in the white matter, unchanged.  No acute infarct. Negative for intracranial hemorrhage.  Negative for mass or edema. Calvarium is intact.  IMPRESSION: Atrophy and chronic microvascular ischemia.  No acute abnormality.  CT MAXILLOFACIAL  Findings:   Negative for facial fracture.  No fracture of the orbit or nasal bone is identified.  Zygomatic arch is intact. No blood in the sinuses.  Mild mucosal edema in the paranasal sinuses.  No soft tissue mass or hematoma. Degenerative changes at C1-2 with cystic change in the dens.  Carotid artery calcification bilaterally.  IMPRESSION: Negative for fracture.   Original Report Authenticated By: Camelia Phenes, M.D.      [facility] Calculation: Score not calculated

## 2012-08-15 NOTE — Assessment & Plan Note (Addendum)
Discussed with Dr. Arrie Aran, who advises that the thrombus is likely chronic and organized and related to central venous access previously placed in the right internal jugular.  Anticoagulation is not indicated, and significant morbidity is highly unlikely.  The patient is advised to contact vascular surgery to schedule procedure to create vascular access in the left lower extremity.  An appointment to return to see me as already scheduled for 6 months hence.

## 2012-08-19 ENCOUNTER — Encounter (HOSPITAL_COMMUNITY): Payer: Self-pay | Admitting: Emergency Medicine

## 2012-08-19 ENCOUNTER — Inpatient Hospital Stay (HOSPITAL_COMMUNITY)
Admission: EM | Admit: 2012-08-19 | Discharge: 2012-08-26 | DRG: 314 | Disposition: A | Discharge status: Home / Self Care | Payer: Medicare Other | Attending: Internal Medicine | Admitting: Internal Medicine

## 2012-08-19 ENCOUNTER — Emergency Department (HOSPITAL_COMMUNITY): Payer: Medicare Other

## 2012-08-19 DIAGNOSIS — E875 Hyperkalemia: Secondary | ICD-10-CM | POA: Diagnosis present

## 2012-08-19 DIAGNOSIS — N2581 Secondary hyperparathyroidism of renal origin: Secondary | ICD-10-CM

## 2012-08-19 DIAGNOSIS — Z66 Do not resuscitate: Secondary | ICD-10-CM | POA: Diagnosis present

## 2012-08-19 DIAGNOSIS — Z85528 Personal history of other malignant neoplasm of kidney: Secondary | ICD-10-CM

## 2012-08-19 DIAGNOSIS — C649 Malignant neoplasm of unspecified kidney, except renal pelvis: Secondary | ICD-10-CM

## 2012-08-19 DIAGNOSIS — R509 Fever, unspecified: Secondary | ICD-10-CM | POA: Diagnosis present

## 2012-08-19 DIAGNOSIS — Z87891 Personal history of nicotine dependence: Secondary | ICD-10-CM

## 2012-08-19 DIAGNOSIS — I739 Peripheral vascular disease, unspecified: Secondary | ICD-10-CM

## 2012-08-19 DIAGNOSIS — I10 Essential (primary) hypertension: Secondary | ICD-10-CM

## 2012-08-19 DIAGNOSIS — Z951 Presence of aortocoronary bypass graft: Secondary | ICD-10-CM

## 2012-08-19 DIAGNOSIS — I679 Cerebrovascular disease, unspecified: Secondary | ICD-10-CM

## 2012-08-19 DIAGNOSIS — D61818 Other pancytopenia: Secondary | ICD-10-CM | POA: Diagnosis present

## 2012-08-19 DIAGNOSIS — G629 Polyneuropathy, unspecified: Secondary | ICD-10-CM

## 2012-08-19 DIAGNOSIS — N186 End stage renal disease: Secondary | ICD-10-CM | POA: Diagnosis present

## 2012-08-19 DIAGNOSIS — I82890 Acute embolism and thrombosis of other specified veins: Secondary | ICD-10-CM

## 2012-08-19 DIAGNOSIS — Z992 Dependence on renal dialysis: Secondary | ICD-10-CM

## 2012-08-19 DIAGNOSIS — I120 Hypertensive chronic kidney disease with stage 5 chronic kidney disease or end stage renal disease: Secondary | ICD-10-CM | POA: Diagnosis present

## 2012-08-19 DIAGNOSIS — I2510 Atherosclerotic heart disease of native coronary artery without angina pectoris: Secondary | ICD-10-CM | POA: Diagnosis present

## 2012-08-19 DIAGNOSIS — A419 Sepsis, unspecified organism: Secondary | ICD-10-CM | POA: Diagnosis present

## 2012-08-19 DIAGNOSIS — E785 Hyperlipidemia, unspecified: Secondary | ICD-10-CM

## 2012-08-19 DIAGNOSIS — Y849 Medical procedure, unspecified as the cause of abnormal reaction of the patient, or of later complication, without mention of misadventure at the time of the procedure: Secondary | ICD-10-CM | POA: Diagnosis present

## 2012-08-19 DIAGNOSIS — M109 Gout, unspecified: Secondary | ICD-10-CM | POA: Diagnosis present

## 2012-08-19 DIAGNOSIS — D649 Anemia, unspecified: Secondary | ICD-10-CM

## 2012-08-19 DIAGNOSIS — T827XXA Infection and inflammatory reaction due to other cardiac and vascular devices, implants and grafts, initial encounter: Principal | ICD-10-CM | POA: Diagnosis present

## 2012-08-19 LAB — CBC WITH DIFFERENTIAL/PLATELET
Basophils Relative: 0 % (ref 0–1)
Eosinophils Relative: 6 % — ABNORMAL HIGH (ref 0–5)
Hemoglobin: 8.9 g/dL — ABNORMAL LOW (ref 13.0–17.0)
Lymphocytes Relative: 29 % (ref 12–46)
MCH: 34.2 pg — ABNORMAL HIGH (ref 26.0–34.0)
Monocytes Relative: 13 % — ABNORMAL HIGH (ref 3–12)
Neutrophils Relative %: 52 % (ref 43–77)
RBC: 2.6 MIL/uL — ABNORMAL LOW (ref 4.22–5.81)
WBC: 2.4 10*3/uL — ABNORMAL LOW (ref 4.0–10.5)

## 2012-08-19 LAB — COMPREHENSIVE METABOLIC PANEL
ALT: 32 U/L (ref 0–53)
Alkaline Phosphatase: 173 U/L — ABNORMAL HIGH (ref 39–117)
BUN: 32 mg/dL — ABNORMAL HIGH (ref 6–23)
CO2: 31 mEq/L (ref 19–32)
Chloride: 95 mEq/L — ABNORMAL LOW (ref 96–112)
GFR calc Af Amer: 10 mL/min — ABNORMAL LOW (ref >90)
Glucose, Bld: 99 mg/dL (ref 70–99)
Potassium: 4.7 mEq/L (ref 3.5–5.1)
Total Bilirubin: 0.4 mg/dL (ref 0.3–1.2)

## 2012-08-19 NOTE — ED Notes (Signed)
PT. REPORTS ON AND OFF FEVER FOR SEVERAL DAYS WITH GENERALIZED WEAKNESS , REPORTS HEMODIALYZED TODAY / RECEIVING IV ANTIBIOTIC AT DIALYSIS UNSURE OF HIS INFECTION .

## 2012-08-20 ENCOUNTER — Inpatient Hospital Stay (HOSPITAL_COMMUNITY): Payer: Medicare Other

## 2012-08-20 ENCOUNTER — Ambulatory Visit: Payer: Medicare Other | Admitting: Cardiology

## 2012-08-20 ENCOUNTER — Encounter (HOSPITAL_COMMUNITY): Payer: Self-pay | Admitting: Internal Medicine

## 2012-08-20 DIAGNOSIS — I2510 Atherosclerotic heart disease of native coronary artery without angina pectoris: Secondary | ICD-10-CM

## 2012-08-20 DIAGNOSIS — D61818 Other pancytopenia: Secondary | ICD-10-CM

## 2012-08-20 DIAGNOSIS — N186 End stage renal disease: Secondary | ICD-10-CM

## 2012-08-20 DIAGNOSIS — R509 Fever, unspecified: Secondary | ICD-10-CM

## 2012-08-20 LAB — CBC WITH DIFFERENTIAL/PLATELET
Eosinophils Absolute: 0.1 10*3/uL (ref 0.0–0.7)
Eosinophils Relative: 5 % (ref 0–5)
HCT: 27.4 % — ABNORMAL LOW (ref 39.0–52.0)
Hemoglobin: 8.7 g/dL — ABNORMAL LOW (ref 13.0–17.0)
Lymphocytes Relative: 33 % (ref 12–46)
Lymphs Abs: 0.6 10*3/uL — ABNORMAL LOW (ref 0.7–4.0)
MCH: 33.7 pg (ref 26.0–34.0)
MCV: 106.2 fL — ABNORMAL HIGH (ref 78.0–100.0)
Monocytes Absolute: 0.3 10*3/uL (ref 0.1–1.0)
Monocytes Relative: 13 % — ABNORMAL HIGH (ref 3–12)
RBC: 2.58 MIL/uL — ABNORMAL LOW (ref 4.22–5.81)
WBC: 1.9 10*3/uL — ABNORMAL LOW (ref 4.0–10.5)

## 2012-08-20 LAB — COMPREHENSIVE METABOLIC PANEL
ALT: 34 U/L (ref 0–53)
AST: 75 U/L — ABNORMAL HIGH (ref 0–37)
Albumin: 3.3 g/dL — ABNORMAL LOW (ref 3.5–5.2)
CO2: 29 mEq/L (ref 19–32)
Calcium: 8.8 mg/dL (ref 8.4–10.5)
Creatinine, Ser: 5.76 mg/dL — ABNORMAL HIGH (ref 0.50–1.35)
GFR calc non Af Amer: 8 mL/min — ABNORMAL LOW (ref >90)
Sodium: 137 mEq/L (ref 135–145)

## 2012-08-20 LAB — LACTIC ACID, PLASMA: Lactic Acid, Venous: 0.9 mmol/L (ref 0.5–2.2)

## 2012-08-20 MED ORDER — ONDANSETRON HCL 4 MG/2ML IJ SOLN: 4.0000 mg | Freq: Four times a day (QID) | INTRAMUSCULAR | Status: DC | PRN

## 2012-08-20 MED ORDER — RENA-VITE PO TABS
1.0000 | ORAL_TABLET | Freq: Every day | ORAL | Status: DC
  Administered 2012-08-20 – 2012-08-25 (×6): 1 via ORAL
  Filled 2012-08-20 (×7): qty 1

## 2012-08-20 MED ORDER — COLCHICINE 0.6 MG PO TABS: 0.6000 mg | ORAL_TABLET | Freq: Two times a day (BID) | ORAL | Status: DC | PRN

## 2012-08-20 MED ORDER — CALCIUM CARBONATE ANTACID 500 MG PO CHEW
1.0000 | CHEWABLE_TABLET | Freq: Every day | ORAL | Status: DC
  Administered 2012-08-20 – 2012-08-26 (×7): 200 mg via ORAL
  Filled 2012-08-20 (×7): qty 1

## 2012-08-20 MED ORDER — CINACALCET HCL 30 MG PO TABS
30.0000 mg | ORAL_TABLET | Freq: Every day | ORAL | Status: DC
  Administered 2012-08-20 – 2012-08-25 (×6): 30 mg via ORAL
  Filled 2012-08-20 (×8): qty 1

## 2012-08-20 MED ORDER — VANCOMYCIN HCL 1000 MG IV SOLR
750.0000 mg | Freq: Once | INTRAVENOUS | Status: AC
  Administered 2012-08-20: 750 mg via INTRAVENOUS
  Filled 2012-08-20: qty 750

## 2012-08-20 MED ORDER — ACETAMINOPHEN 325 MG PO TABS
650.0000 mg | ORAL_TABLET | Freq: Four times a day (QID) | ORAL | Status: DC | PRN
  Administered 2012-08-22: 650 mg via ORAL
  Filled 2012-08-20: qty 2

## 2012-08-20 MED ORDER — ACETAMINOPHEN 650 MG RE SUPP: 650.0000 mg | Freq: Four times a day (QID) | RECTAL | Status: DC | PRN

## 2012-08-20 MED ORDER — GABAPENTIN 100 MG PO CAPS
100.0000 mg | ORAL_CAPSULE | Freq: Two times a day (BID) | ORAL | Status: DC
  Administered 2012-08-20 – 2012-08-24 (×9): 100 mg via ORAL
  Filled 2012-08-20 (×10): qty 1

## 2012-08-20 MED ORDER — ONDANSETRON HCL 4 MG PO TABS: 4.0000 mg | ORAL_TABLET | Freq: Four times a day (QID) | ORAL | Status: DC | PRN

## 2012-08-20 MED ORDER — SODIUM CHLORIDE 0.9 % IJ SOLN
3.0000 mL | Freq: Two times a day (BID) | INTRAMUSCULAR | Status: DC
  Administered 2012-08-20 – 2012-08-23 (×6): 3 mL via INTRAVENOUS

## 2012-08-20 MED ORDER — POLYETHYLENE GLYCOL 3350 17 GM/SCOOP PO POWD
17.0000 g | Freq: Every day | ORAL | Status: DC
  Filled 2012-08-20: qty 255

## 2012-08-20 MED ORDER — ASPIRIN EC 81 MG PO TBEC
81.0000 mg | DELAYED_RELEASE_TABLET | Freq: Every day | ORAL | Status: DC
  Administered 2012-08-20 – 2012-08-25 (×6): 81 mg via ORAL
  Filled 2012-08-20 (×7): qty 1

## 2012-08-20 MED ORDER — PARICALCITOL 5 MCG/ML IV SOLN
8.0000 ug | INTRAVENOUS | Status: DC
  Administered 2012-08-23 – 2012-08-26 (×2): 8 ug via INTRAVENOUS
  Filled 2012-08-20 (×3): qty 1.6

## 2012-08-20 MED ORDER — SODIUM CHLORIDE 0.9 % IV SOLN
125.0000 mg | Freq: Once | INTRAVENOUS | Status: DC
  Filled 2012-08-20: qty 10

## 2012-08-20 MED ORDER — PANTOPRAZOLE SODIUM 40 MG PO TBEC
40.0000 mg | DELAYED_RELEASE_TABLET | Freq: Every day | ORAL | Status: DC
  Administered 2012-08-20 – 2012-08-25 (×5): 40 mg via ORAL
  Filled 2012-08-20 (×3): qty 1

## 2012-08-20 MED ORDER — DARBEPOETIN ALFA-POLYSORBATE 200 MCG/0.4ML IJ SOLN
200.0000 ug | Freq: Once | INTRAMUSCULAR | Status: AC
  Administered 2012-08-20: 200 ug via INTRAVENOUS
  Filled 2012-08-20: qty 0.4

## 2012-08-20 MED ORDER — DARBEPOETIN ALFA-POLYSORBATE 200 MCG/0.4ML IJ SOLN
INTRAMUSCULAR | Status: AC
  Administered 2012-08-20: 200 ug via INTRAVENOUS
  Filled 2012-08-20: qty 0.4

## 2012-08-20 MED ORDER — SEVELAMER CARBONATE 800 MG PO TABS: 1600.0000 mg | ORAL_TABLET | Freq: Every day | ORAL | Status: DC

## 2012-08-20 MED ORDER — DEXTROSE 5 % IV SOLN
1.0000 g | INTRAVENOUS | Status: DC
  Administered 2012-08-20 – 2012-08-21 (×2): 1 g via INTRAVENOUS
  Filled 2012-08-20 (×4): qty 1

## 2012-08-20 MED ORDER — SEVELAMER CARBONATE 800 MG PO TABS
1600.0000 mg | ORAL_TABLET | Freq: Two times a day (BID) | ORAL | Status: DC | PRN
  Filled 2012-08-20: qty 2

## 2012-08-20 MED ORDER — SODIUM CHLORIDE 0.9 % IJ SOLN
3.0000 mL | Freq: Two times a day (BID) | INTRAMUSCULAR | Status: DC
  Administered 2012-08-20 – 2012-08-25 (×8): 3 mL via INTRAVENOUS

## 2012-08-20 MED ORDER — DIALYVITE PO TABS: 1.0000 | ORAL_TABLET | Freq: Every day | ORAL | Status: DC

## 2012-08-20 MED ORDER — SEVELAMER CARBONATE 800 MG PO TABS
3200.0000 mg | ORAL_TABLET | Freq: Three times a day (TID) | ORAL | Status: DC
  Administered 2012-08-20 – 2012-08-26 (×17): 3200 mg via ORAL
  Filled 2012-08-20 (×22): qty 4

## 2012-08-20 MED ORDER — POLYETHYLENE GLYCOL 3350 17 G PO PACK
17.0000 g | PACK | Freq: Every day | ORAL | Status: DC
  Administered 2012-08-20 – 2012-08-25 (×6): 17 g via ORAL
  Filled 2012-08-20 (×7): qty 1

## 2012-08-20 MED ORDER — DEXTROSE 5 % IV SOLN
2.0000 g | Freq: Once | INTRAVENOUS | Status: AC
  Administered 2012-08-20: 2 g via INTRAVENOUS
  Filled 2012-08-20: qty 2

## 2012-08-20 MED ORDER — ATORVASTATIN CALCIUM 10 MG PO TABS
10.0000 mg | ORAL_TABLET | Freq: Every day | ORAL | Status: DC
  Administered 2012-08-20 – 2012-08-25 (×5): 10 mg via ORAL
  Filled 2012-08-20 (×8): qty 1

## 2012-08-20 MED ORDER — ALLOPURINOL 100 MG PO TABS
100.0000 mg | ORAL_TABLET | Freq: Every day | ORAL | Status: DC
  Administered 2012-08-20 – 2012-08-25 (×6): 100 mg via ORAL
  Filled 2012-08-20 (×7): qty 1

## 2012-08-20 NOTE — Progress Notes (Signed)
[facility] PROGRESS NOTE  AB ELLERBE ZOX:[id number] DOB: [birth year] DOA: 08/19/2012 PCP: [name]., MD  Assessment/Plan: Principal Problem:  *Fever Active Problems:  Pancytopenia  ESRD (end stage renal disease) on dialysis  CAD (coronary artery disease)  1.  #1. Fever - unclear source. Blood cultures has been obtained already. UA AND urine cultures pending.  patient empirically on vancomycin cefepime . Renal consult called to see if HD catheter can be removed, if its the source of infection.  #2. ESRD on hemodialysis Monday Wednesday and Friday - patient is not acutely short of breath. Consulted nephrology for dialysis.  #3. Chronic pancytopenia -  we have discussed with patient and family the patient will eventually need followup with hematologist for possibly BM biopsy .   #4. CAD status post CABG - presently chest pain-free.  #5. History of gout - continue present medications.  #6. History of renal cell carcinoma status post nephrectomy. #7 hyperkalemia: hemolysed sample, repeat K WAS 5.   Code Status: full code Family Communication: none at bedside Disposition Plan: home when medically stable.    Brief narrative:  78 year old male with known history of ESRD on hemodialysis on Monday Wednesday and Friday, history of CAD status post CABG, gout, nephrectomy for renal cell cancer, chronic pancytopenia has been experiencing fever for last 2 weeks. Patient was initially noticed to have fever after dialysis 2 weeks ago and as per daughter the fever was around 104F. Patient was started on empiric antibiotics during dialysis after obtaining cultures. We don't have access to the culture results. Since patient had still persistent fever patient was instructed to come to the ER. In the ER patient was mildly febrile with chronic pancytopenia. Chest x-ray was not showing anything acute. At this time the source of fever is not clear. Blood culture has been obtained patient  admitted for further observation and management.  Consultants:  Renal consult  Hematology consult  Procedures:  Hd today  Antibiotics:  Vancomycin 9/24  Cefepime 9/24  HPI/Subjective: Exhausted, tired.   Objective: Filed Vitals:   08/20/12 0015 08/20/12 0100 08/20/12 0239 08/20/12 0525  BP: 150/68 162/69 145/74 119/48  Pulse: 88 88 94 89  Temp:  99.6 F (37.6 C) 100.1 F (37.8 C) 99 F (37.2 C)  TempSrc:   Oral Oral  Resp:  18 20 18   Height:   5\' 8"  (1.727 m)   Weight:   78.835 kg (173 lb 12.8 oz)   SpO2: 100% 100% 97% 97%    Intake/Output Summary (Last 24 hours) at 08/20/12 1000 Last data filed at 08/20/12 0535  Gross per 24 hour  Intake    200 ml  Output      0 ml  Net    200 ml   Filed Weights   08/19/12 2352 08/20/12 0239  Weight: 77.565 kg (171 lb) 78.835 kg (173 lb 12.8 oz)    Exam:   General:  Alert low grade fever, comfortable, very hard of hearing.  Cardiovascular: s1s2   Respiratory: CTAB  Abdomen: Soft NT ND BS+  Extremities: no cyanosis or pedal edema  Neuro: no focal deficits.   Data Reviewed: Basic Metabolic Panel:  Lab 08/20/12 8119 08/19/12 2158  NA 137 137  K 6.1* 4.7  CL 95* 95*  CO2 29 31  GLUCOSE 139* 99  BUN 38* 32*  CREATININE 5.76* 5.29*  CALCIUM 8.8 9.0  MG -- --  PHOS -- --   Liver Function Tests:  Lab 08/20/12 0310 08/19/12 2158  AST 75* 46*  ALT 34 32  ALKPHOS 165* 173*  BILITOT 0.4 0.4  PROT 6.8 6.6  ALBUMIN 3.3* 3.3*   No results found for this basename: LIPASE:5,AMYLASE:5 in the last 168 hours No results found for this basename: AMMONIA:5 in the last 168 hours CBC:  Lab 08/20/12 0310 08/19/12 2158  WBC 1.9* 2.4*  NEUTROABS 0.9* 1.3*  HGB 8.7* 8.9*  HCT 27.4* 27.6*  MCV 106.2* 106.2*  PLT 88* 107*   Cardiac Enzymes: No results found for this basename: CKTOTAL:5,CKMB:5,CKMBINDEX:5,TROPONINI:5 in the last 168 hours BNP (last 3 results)  Basename 04/21/12 1620  PROBNP 25486.0*    CBG: No results found for this basename: GLUCAP:5 in the last 168 hours  No results found for this or any previous visit (from the past 240 hour(s)).   Studies: Dg Chest 2 View  08/19/2012  *RADIOLOGY REPORT*  Clinical Data: 78 year old male fever.  History of CABG, right nephrectomy.  CHEST - 2 VIEW  Comparison: 06/04/2012.  Findings: Tunneled left chest dual lumen dialysis type catheter in place.  Sequelae of CABG.  Epigastric surgical clips.  Left axillary surgical clips.  Metal vascular stent is stable in the distal right subclavian/axillary region .  No pneumothorax, pulmonary edema, pleural effusion or confluent pulmonary opacity. No acute osseous abnormality identified.   Stable cardiac size and mediastinal contours.  IMPRESSION: No acute cardiopulmonary abnormality.   Original Report Authenticated By: Ulla Potash III, M.D.     Scheduled Meds:   . allopurinol  100 mg Oral QHS  . aspirin EC  81 mg Oral QHS  . atorvastatin  10 mg Oral q1800  . calcium carbonate  1 tablet Oral Daily  . ceFEPime (MAXIPIME) IV  2 g Intravenous Once  . cinacalcet  30 mg Oral Q breakfast  . gabapentin  100 mg Oral BID  . multivitamin  1 tablet Oral QHS  . pantoprazole  40 mg Oral Q1200  . polyethylene glycol  17 g Oral QHS  . sevelamer  3,200 mg Oral TID WC  . sodium chloride  3 mL Intravenous Q12H  . sodium chloride  3 mL Intravenous Q12H  . vancomycin  750 mg Intravenous Once  . DISCONTD: DIALYVITE TABLET  1 tablet Oral QHS  . DISCONTD: DIALYVITE TABLET  1 tablet Oral QHS  . DISCONTD: polyethylene glycol powder  17 g Oral QHS  . DISCONTD: sevelamer  1,600-3,200 mg Oral 5 X Daily   Continuous Infusions:   Principal Problem:  *Fever Active Problems:  Pancytopenia  ESRD (end stage renal disease) on dialysis  CAD (coronary artery disease)       Sherese Heyward  [facility] Pager [phone] If 8PM-8AM, please contact night-coverage at [email or web address], password [facility] 08/20/2012, 10:00 AM   LOS: 1 day

## 2012-08-20 NOTE — Progress Notes (Signed)
Noted K+ level 6.1 this am, notified Dr Blake Divine.

## 2012-08-20 NOTE — Progress Notes (Signed)
Reason for Referral: Pancytopenia.   HPI: 78 year old male of [place] with known history of ESRD on hemodialysis on Monday Wednesday and Friday, history of CAD status post CABG, gout, nephrectomy for renal cell cancer. Patient was hospitalized after complaining of fevers in the last 2 weeks. Patient was initially noticed to have fever after dialysis 2 weeks ago and as per daughter the fever was around 104F. Patient was started on empiric antibiotics during dialysis after obtaining cultures. Chest x-ray was not showing anything acute. At this time the source of fever is not clear. Blood culture has been obtained upon hospitalizations as well.   Patient noted to be pancytopenic with counts lower than his base line counts.  Looking back to at least 2009, his WBC has been 3-4, platelet count around 100 K  Patient denies any shortness of breath productive cough chest pain nausea vomiting abdominal pain diarrhea or dysuria. Denies any weight loss or night sweats. He feels better this afternoon.    Past Medical History  Diagnosis Date  . Secondary hyperparathyroidism   . Peripheral vascular disease   . Hyperlipidemia   . Duodenal ulcer     remote  . Gout STABLE  . ESRD (end stage renal disease)     Hemodialysis since 2003; Nephrologist-Dr. Caryn Section  . Arteriosclerotic cardiovascular disease (ASCVD) 1998    CABG-1998; h/o CHF; Atherosclerosis of great vessels, aorta and coronaries on CT in 02/2012  . Cerebrovascular disease   . Neuropathy of foot   . Degenerative joint disease   . Gastroesophageal reflux disease   . History of bladder cancer   . Impaired hearing     Bilateral; hearing aids relatively ineffective  . Anemia     Prior blood transfusion  . Renal cell carcinoma 2001    s/p right nephrectomy-2001; subsequent ESRD  . Hypertension   . Cholelithiasis 03/2011    Diagnosed incidentally on abdominal ultrasound in 03/2011  . Hepatic steatosis   :  Past Surgical History  Procedure  Date  . Right nephrectomy 2001    Renal cell carcinoma  . Colonoscopy 01/24/2011    prominent vascular pattern, suboptimal prep but doable  . Esophagogastroduodenoscopy 01/24/2011    mild erosive reflux esophagitis, bulbar/antral erosions, bx from antrum benign  . Av fistula repair 2008    revision of anastomosis of Right AVF  . Arteriovenous graft placement 2006    Thrombectomy and interposition jump graft revision to higher axillary vein of LUA AVG  . Coronary artery bypass graft 1998    X4 VESSELS  . Multiple cysto/ resection tumor's with bx's LAST ONE 05-09-2011  . Multiple surg's / interventions for avgg/ fistula right upper arm LAST REVISION 06-29-2011    (JUNE 2010 STENT CEPHALIC VEIN/ 03-23-2011 DILATATION ANGIOPLASTY)  . Left ptc left renal artery   . Cardiac catheterization 2004  . Cataract extraction w/ intraocular lens  implant, bilateral   . Cystoscopy w/ retrogrades 12/28/2011    Procedure: CYSTOSCOPY WITH RETROGRADE PYELOGRAM;  Surgeon: [name], MD;  Location: [facility];  Service: Urology;  Laterality: Left;  C-ARM   . Transurethral resection of bladder tumor 12/28/2011    Procedure: TRANSURETHRAL RESECTION OF BLADDER TUMOR (TURBT);  Surgeon: [name], MD;  Location: [facility]  Service: Urology;  Laterality: N/A;  . Av fistula placement 02/29/2012    Procedure: INSERTION OF ARTERIOVENOUS (AV) GORE-TEX GRAFT THIGH;  Surgeon: [name], MD;  Location: [facility] OR;  Service: Vascular;  Laterality: Right;  Insertion right femoral arteriovenous gortex graft  . Ligation of right braciocephalic av fistula 04-04-2012  . Insertion of dialysis catheter 06/05/2012    Procedure: INSERTION OF DIALYSIS CATHETER;  Surgeon: Pryor Ochoa, MD;  Location: [facility] OR;  Service: Vascular;  Laterality: Left;  Insertion diatek catheter left IJ  :      . allopurinol  100 mg Oral QHS  . aspirin EC  81 mg Oral QHS  . atorvastatin  10 mg Oral q1800  . calcium  carbonate  1 tablet Oral Daily  . ceFEPime (MAXIPIME) IV  2 g Intravenous Once  . cinacalcet  30 mg Oral Q breakfast  . gabapentin  100 mg Oral BID  . multivitamin  1 tablet Oral QHS  . pantoprazole  40 mg Oral Q1200  . polyethylene glycol  17 g Oral QHS  . sevelamer  3,200 mg Oral TID WC  . sodium chloride  3 mL Intravenous Q12H  . sodium chloride  3 mL Intravenous Q12H  . vancomycin  750 mg Intravenous Once  . DISCONTD: DIALYVITE TABLET  1 tablet Oral QHS  . DISCONTD: DIALYVITE TABLET  1 tablet Oral QHS  . DISCONTD: polyethylene glycol powder  17 g Oral QHS  . DISCONTD: sevelamer  1,600-3,200 mg Oral 5 X Daily  :  No Known Allergies:  Family History  Problem Relation Age of Onset  . Colon cancer Neg Hx   . Liver disease Neg Hx   . Anesthesia problems Neg Hx   . Heart disease Father   :  History   Social History  . Marital Status: Widowed    Spouse Name: N/A    Number of Children: 3  . Years of Education: N/A   Occupational History  . Retired    Social History Main Topics  . Smoking status: Former Smoker -- 1.0 packs/day for 25 years    Types: Cigarettes    Quit date: 02/26/1984  . Smokeless tobacco: Never Used   Comment: quit 1985  . Alcohol Use: No  . Drug Use: No  . Sexually Active: Not Currently   Other Topics Concern  . Not on file   Social History Narrative  . No narrative on file  :  A comprehensive review of systems was negative.  Exam: Patient Vitals for the past 24 hrs:  BP Temp Temp src Pulse Resp SpO2 Height Weight  08/20/12 0525 119/48 mmHg 99 F (37.2 C) Oral 89  18  97 % - -  08/20/12 0239 145/74 mmHg 100.1 F (37.8 C) Oral 94  20  97 % 5\' 8"  (1.727 m) 173 lb 12.8 oz (78.835 kg)  08/20/12 0100 162/69 mmHg 99.6 F (37.6 C) - 88  18  100 % - -  08/20/12 0015 150/68 mmHg - - 88  - 100 % - -  08/19/12 2352 153/70 mmHg 99.4 F (37.4 C) Oral 87  18  100 % 5\' 8"  (1.727 m) 171 lb (77.565 kg)  08/19/12 2148 96/47 mmHg 98.6 F (37 C) Oral 91   19  97 % - -   General appearance: alert and appears stated age Head: Normocephalic, without obvious abnormality, atraumatic Eyes: conjunctivae/corneas clear. PERRL, EOM's intact. Fundi benign. Nose: Nares normal. Septum midline. Mucosa normal. No drainage or sinus tenderness. Neck: no adenopathy, no carotid bruit, no JVD, supple, symmetrical, trachea midline and thyroid not enlarged, symmetric, no tenderness/mass/nodules Resp: clear to auscultation bilaterally Chest wall: no tenderness Cardio: regular rate and rhythm,  S1, S2 normal, no murmur, click, rub or gallop GI: soft, non-tender; bowel sounds normal; no masses,  no organomegaly Extremities: extremities normal, atraumatic, no cyanosis or edema Skin: Skin color, texture, turgor normal. No rashes or lesions Lymph nodes: Cervical, supraclavicular, and axillary nodes normal.   Basename 08/20/12 0310 08/19/12 2158  WBC 1.9* 2.4*  HGB 8.7* 8.9*  HCT 27.4* 27.6*  PLT 88* 107*      Lab 08/20/12 1029 08/20/12 0310  NA -- 137  K 5.0 --  CL -- 95*  CO2 -- 29  BUN -- 38*  CREATININE -- 5.76*  CALCIUM -- 8.8  PROT -- 6.8  BILITOT -- 0.4  ALKPHOS -- 165*  ALT -- 34  AST -- 75*  GLUCOSE -- 139*     Blood smear review:  Smear reviewed personally: Now schistocytes, no rbc fragments noted. No dyslplasia noted.     Dg Chest 2 View  08/19/2012  *RADIOLOGY REPORT*  Clinical Data: 78 year old male fever.  History of CABG, right nephrectomy.  CHEST - 2 VIEW  Comparison: 06/04/2012.  Findings: Tunneled left chest dual lumen dialysis type catheter in place.  Sequelae of CABG.  Epigastric surgical clips.  Left axillary surgical clips.  Metal vascular stent is stable in the distal right subclavian/axillary region .  No pneumothorax, pulmonary edema, pleural effusion or confluent pulmonary opacity. No acute osseous abnormality identified.   Stable cardiac size and mediastinal contours.  IMPRESSION: No acute cardiopulmonary abnormality.    Original Report Authenticated By: Harley Hallmark, M.D.    Ct Head Wo Contrast  08/14/2012  *RADIOLOGY REPORT*  Clinical Data:  Larey Seat and hit face.  Head injury.  CT HEAD WITHOUT CONTRAST CT MAXILLOFACIAL WITHOUT CONTRAST  Technique:  Multidetector CT imaging of the head and maxillofacial structures were performed using the standard protocol without intravenous contrast. Multiplanar CT image reconstructions of the maxillofacial structures were also generated.  Comparison:  CT head 03/01/2012  CT HEAD  Findings: Generalized atrophy.  Mild chronic microvascular ischemic changes in the white matter, unchanged.  No acute infarct. Negative for intracranial hemorrhage.  Negative for mass or edema. Calvarium is intact.  IMPRESSION: Atrophy and chronic microvascular ischemia.  No acute abnormality.  CT MAXILLOFACIAL  Findings:   Negative for facial fracture.  No fracture of the orbit or nasal bone is identified.  Zygomatic arch is intact. No blood in the sinuses.  Mild mucosal edema in the paranasal sinuses.  No soft tissue mass or hematoma. Degenerative changes at C1-2 with cystic change in the dens.  Carotid artery calcification bilaterally.  IMPRESSION: Negative for fracture.   Original Report Authenticated By: Camelia Phenes, M.D.    US Carotid Duplex Bilateral  08/06/2012  *RADIOLOGY REPORT*  Clinical Data: Carotid bruit.  Previous stroke.  Hypertension, coronary artery disease, diabetes, tobacco use.  BILATERAL CAROTID DUPLEX ULTRASOUND  Technique: Wallace Cullens scale imaging, color Doppler and duplex ultrasound was performed of bilateral carotid and vertebral arteries in the neck.  Comparison: None available  Criteria:  Quantification of carotid stenosis is based on velocity parameters that correlate the residual internal carotid diameter with NASCET-based stenosis levels, using the diameter of the distal internal carotid lumen as the denominator for stenosis measurement.  The following velocity measurements were  obtained:                   PEAK SYSTOLIC/END DIASTOLIC RIGHT ICA:  278/66cm/sec CCA:                        142/24cm/sec SYSTOLIC ICA/CCA RATIO:     1.95 DIASTOLIC ICA/CCA RATIO:    2.75 ECA:                        182cm/sec  LEFT ICA:                        134/24cm/sec CCA:                        126/28cm/sec SYSTOLIC ICA/CCA RATIO:     1.06 DIASTOLIC ICA/CCA RATIO:    0.86 ECA:                        145cm/sec  Findings:  RIGHT CAROTID ARTERY: Thrombus in the right IJ vein is noted. There is irregular calcified plaque effacing the carotid bulb and extending into proximal internal and external carotid arteries resulting in at least mild stenosis.  There are elevated peak systolic velocities in the proximal ICA.  RIGHT VERTEBRAL ARTERY:  Normal flow direction and waveform.  LEFT CAROTID ARTERY: Eccentric nonocclusive plaque scattered through the common carotid artery.  There is more heavily calcified eccentric circumferential plaque in the carotid bulb extending into proximal internal and external carotid arteries resulting in at least mild stenosis.  Mildly elevated peak systolic velocities recorded.  Normal color Doppler signal.  LEFT VERTEBRAL ARTERY:  Normal flow direction and waveform.  IMPRESSION:  1.  Bilateral carotid bifurcation and proximal ICA plaque, resulting in 50-69% diameter stenosis on the right, less than 50% diameter stenosis on the left. The exam does not exclude plaque ulceration or embolization.  Continued surveillance recommended. 2.  Right IJ thrombus is incidentally noted.   Original Report Authenticated By: Osa Craver, M.D.    US Arterial Seg Single  08/06/2012  *RADIOLOGY REPORT*  Clinical Data: Bilateral foot pain  ULTRASOUND ANKLE/BRACHIAL INDICES BILATERAL  Comparison: None.  Findings: Right and left ankle brachial indices are low at 0.85 and 0.71.  Right posterior tibial wave form is biphasic.  The right dorsalis pedis, left posterior tibial, and  left dorsalis pedis are monophasic.  IMPRESSION: Moderate right lower extremity and severe left lower extremity arterial occlusive disease.  CT angiogram is recommended to further characterize.   Original Report Authenticated By: Donavan Burnet, M.D.    Ct Maxillofacial Wo Cm  08/14/2012  *RADIOLOGY REPORT*  Clinical Data:  Larey Seat and hit face.  Head injury.  CT HEAD WITHOUT CONTRAST CT MAXILLOFACIAL WITHOUT CONTRAST  Technique:  Multidetector CT imaging of the head and maxillofacial structures were performed using the standard protocol without intravenous contrast. Multiplanar CT image reconstructions of the maxillofacial structures were also generated.  Comparison:  CT head 03/01/2012  CT HEAD  Findings: Generalized atrophy.  Mild chronic microvascular ischemic changes in the white matter, unchanged.  No acute infarct. Negative for intracranial hemorrhage.  Negative for mass or edema. Calvarium is intact.  IMPRESSION: Atrophy and chronic microvascular ischemia.  No acute abnormality.  CT MAXILLOFACIAL  Findings:   Negative for facial fracture.  No fracture of the orbit or nasal bone is identified.  Zygomatic arch is intact. No blood in the sinuses.  Mild mucosal edema in the paranasal sinuses.  No soft tissue mass or hematoma. Degenerative changes at  C1-2 with cystic change in the dens.  Carotid artery calcification bilaterally.  IMPRESSION: Negative for fracture.   Original Report Authenticated By: Camelia Phenes, M.D.     Assessment and Plan: 78 year old with the following issue:  1. Mild pancytopenia. His base line counts show Hgb 9 and platelet around 100K. His current counts are slightly lower than that.  The differential diagnosis discussed today with the patient and his daughter. This could be due acute illness, infection, medication related (colchicine) or could be exacerbation of early MDS.  At this time, platelet count and hgb are relatively stable.  He does not need growth factor support or  transfusion.  I would continue to treat the underlying infection with antibiotics.  He will need follow up with hematology as an outpatient (he prefers  at [facility]) for evaluation at base line and possible bone marrow biopsy if counts continue to be low.    There is no indication for that an inpatient.   2. Fevers and possible HD line infection. I agree with broad spectrum antibiotics and HD catheter replacement if it was felt to be the source.   His ANC 900 and it should be adequate at this time. No need for Neupogen.    I will continue to monitor his counts while inpatient.  Please call with questions.

## 2012-08-20 NOTE — H&P (Signed)
Andrew [name] is an 78 y.o. male.   Patient was seen and examined on August 20, 2012. PCP - Dr. Elfredia Nevins.  Cardiologist  - Dr. Dietrich Pates.  Nephrologist  - Dr. Hyman Hopes.  Chief Complaint: Fever.  HPI: 78 year old male with known history of ESRD on hemodialysis on Monday Wednesday and Friday, history of CAD status post CABG, gout, nephrectomy for renal cell cancer, chronic pancytopenia has been experiencing fever for last 2 weeks. Patient was initially noticed to have fever after dialysis 2 weeks ago and as per daughter the fever was around 104F. Patient was started on empiric antibiotics during dialysis after obtaining cultures. We don't have access to the culture results. Since patient had still persistent fever patient was instructed to come to the ER. In the ER patient was mildly febrile with chronic pancytopenia. Chest x-ray was not showing anything acute. At this time the source of fever is not clear. Blood culture has been obtained patient admitted for further observation and management. Patient denies any shortness of breath productive cough chest pain nausea vomiting abdominal pain diarrhea or dysuria. Denies any weight loss or night sweats.  Past Medical History  Diagnosis Date  . Secondary hyperparathyroidism   . Peripheral vascular disease   . Hyperlipidemia   . Duodenal ulcer     remote  . Gout STABLE  . ESRD (end stage renal disease)     Hemodialysis since 2003; Nephrologist-Dr. Caryn Section  . Arteriosclerotic cardiovascular disease (ASCVD) 1998    CABG-1998; h/o CHF; Atherosclerosis of great vessels, aorta and coronaries on CT in 02/2012  . Cerebrovascular disease   . Neuropathy of foot   . Degenerative joint disease   . Gastroesophageal reflux disease   . History of bladder cancer   . Impaired hearing     Bilateral; hearing aids relatively ineffective  . Anemia     Prior blood transfusion  . Renal cell carcinoma 2001    s/p right nephrectomy-2001; subsequent ESRD  .  Hypertension   . Cholelithiasis 03/2011    Diagnosed incidentally on abdominal ultrasound in 03/2011  . Hepatic steatosis     Past Surgical History  Procedure Date  . Right nephrectomy 2001    Renal cell carcinoma  . Colonoscopy 01/24/2011    prominent vascular pattern, suboptimal prep but doable  . Esophagogastroduodenoscopy 01/24/2011    mild erosive reflux esophagitis, bulbar/antral erosions, bx from antrum benign  . Av fistula repair 2008    revision of anastomosis of Right AVF  . Arteriovenous graft placement 2006    Thrombectomy and interposition jump graft revision to higher axillary vein of LUA AVG  . Coronary artery bypass graft 1998    X4 VESSELS  . Multiple cysto/ resection tumor's with bx's LAST ONE 05-09-2011  . Multiple surg's / interventions for avgg/ fistula right upper arm LAST REVISION 06-29-2011    (JUNE 2010 STENT CEPHALIC VEIN/ 03-23-2011 DILATATION ANGIOPLASTY)  . Left ptc left renal artery   . Cardiac catheterization 2004  . Cataract extraction w/ intraocular lens  implant, bilateral   . Cystoscopy w/ retrogrades 12/28/2011    Procedure: CYSTOSCOPY WITH RETROGRADE PYELOGRAM;  Surgeon: [name], MD;  Location: [facility];  Service: Urology;  Laterality: Left;  C-ARM   . Transurethral resection of bladder tumor 12/28/2011    Procedure: TRANSURETHRAL RESECTION OF BLADDER TUMOR (TURBT);  Surgeon: [name], MD;  Location: [facility];  Service: Urology;  Laterality: N/A;  . Av fistula placement 02/29/2012  Procedure: INSERTION OF ARTERIOVENOUS (AV) GORE-TEX GRAFT THIGH;  Surgeon: [name], MD;  Location: [facility] OR;  Service: Vascular;  Laterality: Right;  Insertion right femoral arteriovenous gortex graft  . Ligation of right braciocephalic av fistula 04-04-2012  . Insertion of dialysis catheter 06/05/2012    Procedure: INSERTION OF DIALYSIS CATHETER;  Surgeon: Pryor Ochoa, MD;  Location: [facility] OR;  Service: Vascular;  Laterality:  Left;  Insertion diatek catheter left IJ    Family History  Problem Relation Age of Onset  . Colon cancer Neg Hx   . Liver disease Neg Hx   . Anesthesia problems Neg Hx   . Heart disease Father    Social History:  reports that he quit smoking about 28 years ago. His smoking use included Cigarettes. He has a 25 pack-year smoking history. He has never used smokeless tobacco. He reports that he does not drink alcohol or use illicit drugs.  Allergies: No Known Allergies   (Not in a hospital admission)  Results for orders placed during the hospital encounter of 08/19/12 (from the past 48 hour(s))  CBC WITH DIFFERENTIAL     Status: Abnormal   Collection Time   08/19/12  9:58 PM      Component Value Range Comment   WBC 2.4 (*) 4.0 - 10.5 K/uL WHITE COUNT CONFIRMED ON SMEAR   RBC 2.60 (*) 4.22 - 5.81 MIL/uL    Hemoglobin 8.9 (*) 13.0 - 17.0 g/dL    HCT 16.1 (*) 09.6 - 52.0 %    MCV 106.2 (*) 78.0 - 100.0 fL    MCH 34.2 (*) 26.0 - 34.0 pg    MCHC 32.2  30.0 - 36.0 g/dL    RDW 04.5 (*) 40.9 - 15.5 %    Platelets 107 (*) 150 - 400 K/uL PLATELET COUNT CONFIRMED BY SMEAR   Neutrophils Relative 52  43 - 77 %    Lymphocytes Relative 29  12 - 46 %    Monocytes Relative 13 (*) 3 - 12 %    Eosinophils Relative 6 (*) 0 - 5 %    Basophils Relative 0  0 - 1 %    Neutro Abs 1.3 (*) 1.7 - 7.7 K/uL    Lymphs Abs 0.7  0.7 - 4.0 K/uL    Monocytes Absolute 0.3  0.1 - 1.0 K/uL    Eosinophils Absolute 0.1  0.0 - 0.7 K/uL    Basophils Absolute 0.0  0.0 - 0.1 K/uL   COMPREHENSIVE METABOLIC PANEL     Status: Abnormal   Collection Time   08/19/12  9:58 PM      Component Value Range Comment   Sodium 137  135 - 145 mEq/L    Potassium 4.7  3.5 - 5.1 mEq/L    Chloride 95 (*) 96 - 112 mEq/L    CO2 31  19 - 32 mEq/L    Glucose, Bld 99  70 - 99 mg/dL    BUN 32 (*) 6 - 23 mg/dL    Creatinine, Ser 8.11 (*) 0.50 - 1.35 mg/dL    Calcium 9.0  8.4 - 91.4 mg/dL    Total Protein 6.6  6.0 - 8.3 g/dL    Albumin  3.3 (*) 3.5 - 5.2 g/dL    AST 46 (*) 0 - 37 U/L    ALT 32  0 - 53 U/L    Alkaline Phosphatase 173 (*) 39 - 117 U/L    Total Bilirubin 0.4  0.3 - 1.2 mg/dL  GFR calc non Af Amer 9 (*) >90 mL/min    GFR calc Af Amer 10 (*) >90 mL/min    Dg Chest 2 View  08/19/2012  *RADIOLOGY REPORT*  Clinical Data: 78 year old male fever.  History of CABG, right nephrectomy.  CHEST - 2 VIEW  Comparison: 06/04/2012.  Findings: Tunneled left chest dual lumen dialysis type catheter in place.  Sequelae of CABG.  Epigastric surgical clips.  Left axillary surgical clips.  Metal vascular stent is stable in the distal right subclavian/axillary region .  No pneumothorax, pulmonary edema, pleural effusion or confluent pulmonary opacity. No acute osseous abnormality identified.   Stable cardiac size and mediastinal contours.  IMPRESSION: No acute cardiopulmonary abnormality.   Original Report Authenticated By: Harley Hallmark, M.D.     Review of Systems  Constitutional: Positive for fever and chills.  HENT: Negative.   Eyes: Negative.   Respiratory: Negative.   Cardiovascular: Negative.   Gastrointestinal: Negative.   Genitourinary: Negative.   Musculoskeletal: Negative.   Skin: Negative.   Neurological: Negative.  Negative for headaches.  Endo/Heme/Allergies: Negative.   Psychiatric/Behavioral: Negative.     Blood pressure 162/69, pulse 88, temperature 99.6 F (37.6 C), temperature source Oral, resp. rate 18, height 5\' 8"  (1.727 m), weight 77.565 kg (171 lb), SpO2 100.00%. Physical Exam  Constitutional: He is oriented to person, place, and time. He appears well-developed and well-nourished. No distress.  HENT:  Head: Normocephalic and atraumatic.  Right Ear: External ear normal.  Left Ear: External ear normal.  Nose: Nose normal.  Mouth/Throat: Oropharynx is clear and moist. No oropharyngeal exudate.  Eyes: Pupils are equal, round, and reactive to light. Right eye exhibits no discharge. Left eye exhibits  no discharge. No scleral icterus.  Neck: Normal range of motion. Neck supple.  Cardiovascular: Normal rate and regular rhythm.   Respiratory: Effort normal and breath sounds normal. No respiratory distress. He has no wheezes. He has no rales.  GI: Soft. Bowel sounds are normal. He exhibits no distension. There is no tenderness. There is no rebound.  Musculoskeletal: Normal range of motion. He exhibits no edema and no tenderness.  Neurological: He is alert and oriented to person, place, and time.       Moves all extremities.  Skin: Skin is warm and dry. He is not diaphoretic. No erythema.  Psychiatric: His behavior is normal.     Assessment/Plan #1. Fever - source was not clear. Blood cultures has been obtained already. Check UA and urine culture if patient voids urine. At this time I have started patient empirically on vancomycin cefepime. Need to discuss with nephrologist in a.m. To see if the dialysis catheter could be a source if so then need to consult vascular surgery. We will also check LDH. #2. ESRD on hemodialysis Monday Wednesday and Friday - patient is not acutely short of breath. Consult nephrology for dialysis. #3. Chronic pancytopenia - closely follow CBC.  I have discussed with patient and family the patient will eventually need followup with pathologist for further workup. #4. CAD status post CABG - presently chest pain-free. #5. History of gout - continue present medications. #6. History of renal cell carcinoma status post nephrectomy.  KAKRAKANDY,ARSHAD N. 08/20/2012, 1:54 AM

## 2012-08-20 NOTE — Care Management Note (Unsigned)
    Page 1 of 1   08/20/2012     10:44:34 AM   CARE MANAGEMENT NOTE 08/20/2012  Patient:  GARRIS, MELHORN   Account Number:  [id number]  Date Initiated:  08/20/2012  Documentation initiated by:  SIMMONS,Omar Gayden  Subjective/Objective Assessment:   ADMITTED WITH INFECTION; LIVES AT HOME ALONE- HAS 2 DAUGHTERS IN TOWN; AMBULATES WITH CANE AT HOME; HAS HD M-W-F.     Action/Plan:   DISCHARGE PLANNING DISCUSSED AT BEDSIDE.   Anticipated DC Date:  08/21/2012   Anticipated DC Plan:  HOME/SELF CARE      DC Planning Services  CM consult      Choice offered to / List presented to:             Status of service:  In process, will continue to follow Medicare Important Message given?   (If response is "NO", the following Medicare IM given date fields will be blank) Date Medicare IM given:   Date Additional Medicare IM given:    Discharge Disposition:    Per UR Regulation:  Reviewed for med. necessity/level of care/duration of stay  If discussed at Long Length of Stay Meetings, dates discussed:    Comments:  08/20/12 0903  Norely Schlick SIMMONS RN, BSN [phone] NCM WILL FOLLOW.

## 2012-08-20 NOTE — Progress Notes (Signed)
ANTIBIOTIC CONSULT NOTE - FOLLOW UP  Pharmacy Consult for Vancomycin and Cefepime Indication: Micrococcus bacteremia  No Known Allergies  Patient Measurements: Height: 5\' 8"  (172.7 cm) Weight: 173 lb 12.8 oz (78.835 kg) IBW/kg (Calculated) : 68.4   Vital Signs: Temp: 98.2 F (36.8 C) (09/24 1358) Temp src: Oral (09/24 0525) BP: 160/79 mmHg (09/24 1358) Pulse Rate: 83  (09/24 1358) Intake/Output from previous day: 09/23 0701 - 09/24 0700 In: 200 [IV Piggyback:200] Out: -  Intake/Output from this shift: Total I/O In: 480 [P.O.:480] Out: -   Labs:  Basename 08/20/12 0310 08/19/12 2158  WBC 1.9* 2.4*  HGB 8.7* 8.9*  PLT 88* 107*  LABCREA -- --  CREATININE 5.76* 5.29*   Estimated Creatinine Clearance: 9.2 ml/min (by C-G formula based on Cr of 5.76).  Basename 08/20/12 0310  VANCOTROUGH --  Leodis Binet --  VANCORANDOM 13.3  GENTTROUGH --  GENTPEAK --  GENTRANDOM --  TOBRATROUGH --  TOBRAPEAK --  TOBRARND --  AMIKACINPEAK --  AMIKACINTROU --  AMIKACIN --     Microbiology: No results found for this or any previous visit (from the past 720 hour(s)).  Anti-infectives     Start     Dose/Rate Route Frequency Ordered Stop   08/20/12 0500   vancomycin (VANCOCIN) 750 mg in sodium chloride 0.9 % 150 mL IVPB        750 mg 150 mL/hr over 60 Minutes Intravenous  Once 08/20/12 0424 08/20/12 0635   08/20/12 0300   ceFEPIme (MAXIPIME) 2 g in dextrose 5 % 50 mL IVPB        2 g 100 mL/hr over 30 Minutes Intravenous  Once 08/20/12 0245 08/20/12 0422          Assessment: Micrococcus bacteremia, fever:  Patient has been on Vancomycin and Ceftazidime with each HD session.  Random Vancomycin level was low this morning and he received a supplemental dose.  He is going to receive HD this evening and then have his dialysis catheter removed.  Goal of Therapy:  preHD Vancomycin level 15-25 mcg/ml  Plan:  Give Vancomycin 750mg  IV after HD today Change Cefepime to 1gm IV q24h  - to be given after HD on HD days Monitor for HD schedule and tolerance.  Estella Husk, Pharm.D., BCPS Clinical Pharmacist  Phone [phone] Pager [phone] 08/20/2012, 3:09 PM

## 2012-08-20 NOTE — Progress Notes (Signed)
ANTIBIOTIC CONSULT NOTE - INITIAL  Pharmacy Consult for vancomycin, cefepime Indication: Empiric  No Known Allergies  Patient Measurements: Height: 5\' 8"  (172.7 cm) Weight: 171 lb (77.565 kg) IBW/kg (Calculated) : 68.4   Vital Signs: Temp: 99.6 F (37.6 C) (09/24 0100) Temp src: Oral (09/23 2352) BP: 162/69 mmHg (09/24 0100) Pulse Rate: 88  (09/24 0100) Intake/Output from previous day:   Intake/Output from this shift:    Labs:  [facility] 08/19/12 2158  WBC 2.4*  HGB 8.9*  PLT 107*  LABCREA --  CREATININE 5.29*   Estimated Creatinine Clearance: 10.1 ml/min (by C-G formula based on Cr of 5.29). No results found for this basename: VANCOTROUGH:2,VANCOPEAK:2,VANCORANDOM:2,GENTTROUGH:2,GENTPEAK:2,GENTRANDOM:2,TOBRATROUGH:2,TOBRAPEAK:2,TOBRARND:2,AMIKACINPEAK:2,AMIKACINTROU:2,AMIKACIN:2, in the last 72 hours   Microbiology: No results found for this or any previous visit (from the past 720 hour(s)).  Medical History: Past Medical History  Diagnosis Date  . Secondary hyperparathyroidism   . Peripheral vascular disease   . Hyperlipidemia   . Duodenal ulcer     remote  . Gout STABLE  . ESRD (end stage renal disease)     Hemodialysis since 2003; Nephrologist-Dr. Caryn Section  . Arteriosclerotic cardiovascular disease (ASCVD) 1998    CABG-1998; h/o CHF; Atherosclerosis of great vessels, aorta and coronaries on CT in 02/2012  . Cerebrovascular disease   . Neuropathy of foot   . Degenerative joint disease   . Gastroesophageal reflux disease   . History of bladder cancer   . Impaired hearing     Bilateral; hearing aids relatively ineffective  . Anemia     Prior blood transfusion  . Renal cell carcinoma 2001    s/p right nephrectomy-2001; subsequent ESRD  . Hypertension   . Cholelithiasis 03/2011    Diagnosed incidentally on abdominal ultrasound in 03/2011  . Hepatic steatosis     Medications:  Scheduled:    . allopurinol  100 mg Oral QHS  . aspirin EC  81 mg Oral QHS  .  atorvastatin  10 mg Oral q morning - 10a  . calcium carbonate  1 tablet Oral Daily  . cinacalcet  30 mg Oral Q breakfast  . DIALYVITE TABLET  1 tablet Oral QHS  . gabapentin  100 mg Oral BID  . pantoprazole  40 mg Oral Q1200  . polyethylene glycol powder  17 g Oral QHS  . sevelamer  1,600-3,200 mg Oral 5 X Daily  . sodium chloride  3 mL Intravenous Q12H  . sodium chloride  3 mL Intravenous Q12H   Assessment: 78 yo male with ESRD-HD MWF admitted with fever. Per family, patient has been receiving IV antibiotics with dialysis sessions x 2 weeks. Patient / family does not know what specific antibiotics the patient was receiving. Pharmacy consulted to manage cefepime and vancomycin. Random vancomycin level on admit 13.3 mcg/mL.   Goal of Therapy:  Pre-HD vancomycin level 15-25 mcg/mL  Plan:  1. Cefepime 2gm IV x 1.  2. Vancomycin 750mg  IV x 1.  3. Follow-up HD plan for further vancomycin / cefepime dosing.   Andrew [name] 08/20/2012,2:36 AM

## 2012-08-20 NOTE — Consult Note (Signed)
[facility] Renal Consultation Note  Indication for Consultation:  Management of ESRD/hemodialysis; anemia, hypertension/volume and secondary hyperparathyroidism  HPI: Andrew [name] is a 78 y.o. male with ESRD on dialysis on MWF in [place] who presented to the ED last night with ongoing fevers for the last two weeks.  His initial fever was post-dialysis on 9/9, at which time blood cultures were obtained and antibiotics, Vancomycin and Ceftazidime, were given empirically.  One of two blood cultures was positive for Micrococcus luteus, and antibiotics were continued.  However, he continued to have fevers and fatigue, and after a temperature of 100.5 after Tylenol last night he came to the hospital.  He denies any cough, dyspnea, nausea , vomiting, diarrhea, or dysuria, but has a left IJ catheter, which was placed for dialysis on 7/10.  Dialysis Orders: Center: [facility] on MWF. EDW 76 kg   HD Bath 2k/2.25Ca  Time 4 hrs  Heparin 3000. Access Left IJ catheter  BFR 400 DFR 800    Zemplar 8 mcg IV/HD  Epogen 11,000 Units IV/HD  Venofer 50 mg on Wed.   Past Medical History  Diagnosis Date  . Secondary hyperparathyroidism   . Peripheral vascular disease   . Hyperlipidemia   . Duodenal ulcer     remote  . Gout STABLE  . ESRD (end stage renal disease)     Hemodialysis since 2003; Nephrologist-Dr. Caryn Section  . Arteriosclerotic cardiovascular disease (ASCVD) 1998    CABG-1998; h/o CHF; Atherosclerosis of great vessels, aorta and coronaries on CT in 02/2012  . Cerebrovascular disease   . Neuropathy of foot   . Degenerative joint disease   . Gastroesophageal reflux disease   . History of bladder cancer   . Impaired hearing     Bilateral; hearing aids relatively ineffective  . Anemia     Prior blood transfusion  . Renal cell carcinoma 2001    s/p right nephrectomy-2001; subsequent ESRD  . Hypertension   . Cholelithiasis 03/2011    Diagnosed incidentally on abdominal ultrasound in  03/2011  . Hepatic steatosis    Past Surgical History  Procedure Date  . Right nephrectomy 2001    Renal cell carcinoma  . Colonoscopy 01/24/2011    prominent vascular pattern, suboptimal prep but doable  . Esophagogastroduodenoscopy 01/24/2011    mild erosive reflux esophagitis, bulbar/antral erosions, bx from antrum benign  . Av fistula repair 2008    revision of anastomosis of Right AVF  . Arteriovenous graft placement 2006    Thrombectomy and interposition jump graft revision to higher axillary vein of LUA AVG  . Coronary artery bypass graft 1998    X4 VESSELS  . Multiple cysto/ resection tumor's with bx's LAST ONE 05-09-2011  . Multiple surg's / interventions for avgg/ fistula right upper arm LAST REVISION 06-29-2011    (JUNE 2010 STENT CEPHALIC VEIN/ 03-23-2011 DILATATION ANGIOPLASTY)  . Left ptc left renal artery   . Cardiac catheterization 2004  . Cataract extraction w/ intraocular lens  implant, bilateral   . Cystoscopy w/ retrogrades 12/28/2011    Procedure: CYSTOSCOPY WITH RETROGRADE PYELOGRAM;  Surgeon: [name], MD;  Location[facility];  Service: Urology;  Laterality: Left;  C-ARM   . Transurethral resection of bladder tumor 12/28/2011    Procedure: TRANSURETHRAL RESECTION OF BLADDER TUMOR (TURBT);  Surgeon: [name], MD;  Location: [facility];  Service: Urology;  Laterality: N/A;  . Av fistula placement 02/29/2012    Procedure: INSERTION OF ARTERIOVENOUS (AV)  GORE-TEX GRAFT THIGH;  Surgeon: [name], MD;  Location: [facility] OR;  Service: Vascular;  Laterality: Right;  Insertion right femoral arteriovenous gortex graft  . Ligation of right braciocephalic av fistula 04-04-2012  . Insertion of dialysis catheter 06/05/2012    Procedure: INSERTION OF DIALYSIS CATHETER;  Surgeon: Pryor Ochoa, MD;  Location: [facility] OR;  Service: Vascular;  Laterality: Left;  Insertion diatek catheter left IJ   Family History  Problem Relation Age of Onset  .  Colon cancer Neg Hx   . Liver disease Neg Hx   . Anesthesia problems Neg Hx   . Heart disease Father     reports that he quit smoking about 28 years ago. His smoking use included Cigarettes. He has a 25 pack-year smoking history. He has never used smokeless tobacco. He reports that he does not drink alcohol or use illicit drugs. No Known Allergies Prior to Admission medications   Medication Sig Start Date End Date Taking? Authorizing Provider  acetaminophen (TYLENOL) 500 MG tablet Take 1,000 mg by mouth every 6 (six) hours as needed. For pain/headache   Yes Historical Provider, MD  allopurinol (ZYLOPRIM) 100 MG tablet Take 100 mg by mouth at bedtime.  09/19/11  Yes Rhetta Mura, MD  aspirin EC 81 MG tablet Take 81 mg by mouth at bedtime.    Yes Historical Provider, MD  atorvastatin (LIPITOR) 10 MG tablet Take 10 mg by mouth every morning.    Yes Historical Provider, MD  B Complex-C-Folic Acid (DIALYVITE TABLET) TABS Take 1 tablet by mouth at bedtime.  05/24/12  Yes Historical Provider, MD  calcium carbonate (TUMS - DOSED IN MG ELEMENTAL CALCIUM) 500 MG chewable tablet Chew 1 tablet by mouth daily as needed. For upset stomach   Yes Historical Provider, MD  cinacalcet (SENSIPAR) 30 MG tablet Take 30 mg by mouth daily with breakfast.    Yes Historical Provider, MD  colchicine (COLCRYS) 0.6 MG tablet Take 0.6 mg by mouth 2 (two) times daily as needed. for gout   Yes Historical Provider, MD  gabapentin (NEURONTIN) 100 MG capsule Take 100 mg by mouth 2 (two) times daily.  01/09/12 01/08/13 Yes Kathlen Brunswick, MD  omeprazole (PRILOSEC) 40 MG capsule Take 40 mg by mouth at bedtime. For reflux   Yes Historical Provider, MD  polyethylene glycol powder (MIRALAX) powder Take 17 g by mouth at bedtime.    Yes Historical Provider, MD  sevelamer (RENVELA) 800 MG tablet Take 1,600-3,200 mg by mouth 5 (five) times daily. 4 tabs 3 x daily and 2 with snack   Yes Historical Provider, MD   Labs:  Results for  orders placed during the hospital encounter of 08/19/12 (from the past 48 hour(s))  CBC WITH DIFFERENTIAL     Status: Abnormal   Collection Time   08/19/12  9:58 PM      Component Value Range Comment   WBC 2.4 (*) 4.0 - 10.5 K/uL WHITE COUNT CONFIRMED ON SMEAR   RBC 2.60 (*) 4.22 - 5.81 MIL/uL    Hemoglobin 8.9 (*) 13.0 - 17.0 g/dL    HCT 16.1 (*) 09.6 - 52.0 %    MCV 106.2 (*) 78.0 - 100.0 fL    MCH 34.2 (*) 26.0 - 34.0 pg    MCHC 32.2  30.0 - 36.0 g/dL    RDW 04.5 (*) 40.9 - 15.5 %    Platelets 107 (*) 150 - 400 K/uL PLATELET COUNT CONFIRMED BY SMEAR   Neutrophils Relative 52  43 - 77 %    Lymphocytes Relative 29  12 - 46 %    Monocytes Relative 13 (*) 3 - 12 %    Eosinophils Relative 6 (*) 0 - 5 %    Basophils Relative 0  0 - 1 %    Neutro Abs 1.3 (*) 1.7 - 7.7 K/uL    Lymphs Abs 0.7  0.7 - 4.0 K/uL    Monocytes Absolute 0.3  0.1 - 1.0 K/uL    Eosinophils Absolute 0.1  0.0 - 0.7 K/uL    Basophils Absolute 0.0  0.0 - 0.1 K/uL   COMPREHENSIVE METABOLIC PANEL     Status: Abnormal   Collection Time   08/19/12  9:58 PM      Component Value Range Comment   Sodium 137  135 - 145 mEq/L    Potassium 4.7  3.5 - 5.1 mEq/L    Chloride 95 (*) 96 - 112 mEq/L    CO2 31  19 - 32 mEq/L    Glucose, Bld 99  70 - 99 mg/dL    BUN 32 (*) 6 - 23 mg/dL    Creatinine, Ser 8.46 (*) 0.50 - 1.35 mg/dL    Calcium 9.0  8.4 - 96.2 mg/dL    Total Protein 6.6  6.0 - 8.3 g/dL    Albumin 3.3 (*) 3.5 - 5.2 g/dL    AST 46 (*) 0 - 37 U/L    ALT 32  0 - 53 U/L    Alkaline Phosphatase 173 (*) 39 - 117 U/L    Total Bilirubin 0.4  0.3 - 1.2 mg/dL    GFR calc non Af Amer 9 (*) >90 mL/min    GFR calc Af Amer 10 (*) >90 mL/min   LACTIC ACID, PLASMA     Status: Normal   Collection Time   08/20/12  1:20 AM      Component Value Range Comment   Lactic Acid, Venous 0.9  0.5 - 2.2 mmol/L   PROCALCITONIN     Status: Normal   Collection Time   08/20/12  1:22 AM      Component Value Range Comment   Procalcitonin 1.97      LACTATE DEHYDROGENASE     Status: Abnormal   Collection Time   08/20/12  2:05 AM      Component Value Range Comment   LDH 295 (*) 94 - 250 U/L   COMPREHENSIVE METABOLIC PANEL     Status: Abnormal   Collection Time   08/20/12  3:10 AM      Component Value Range Comment   Sodium 137  135 - 145 mEq/L    Potassium 6.1 (*) 3.5 - 5.1 mEq/L    Chloride 95 (*) 96 - 112 mEq/L    CO2 29  19 - 32 mEq/L    Glucose, Bld 139 (*) 70 - 99 mg/dL    BUN 38 (*) 6 - 23 mg/dL    Creatinine, Ser 9.52 (*) 0.50 - 1.35 mg/dL    Calcium 8.8  8.4 - 84.1 mg/dL    Total Protein 6.8  6.0 - 8.3 g/dL    Albumin 3.3 (*) 3.5 - 5.2 g/dL    AST 75 (*) 0 - 37 U/L    ALT 34  0 - 53 U/L    Alkaline Phosphatase 165 (*) 39 - 117 U/L    Total Bilirubin 0.4  0.3 - 1.2 mg/dL    GFR calc non Af Amer 8 (*) >90 mL/min  GFR calc Af Amer 9 (*) >90 mL/min   CBC WITH DIFFERENTIAL     Status: Abnormal   Collection Time   08/20/12  3:10 AM      Component Value Range Comment   WBC 1.9 (*) 4.0 - 10.5 K/uL    RBC 2.58 (*) 4.22 - 5.81 MIL/uL    Hemoglobin 8.7 (*) 13.0 - 17.0 g/dL    HCT 45.4 (*) 09.8 - 52.0 %    MCV 106.2 (*) 78.0 - 100.0 fL    MCH 33.7  26.0 - 34.0 pg    MCHC 31.8  30.0 - 36.0 g/dL    RDW 11.9 (*) 14.7 - 15.5 %    Platelets 88 (*) 150 - 400 K/uL CONSISTENT WITH PREVIOUS RESULT   Neutrophils Relative 48  43 - 77 %    Neutro Abs 0.9 (*) 1.7 - 7.7 K/uL    Lymphocytes Relative 33  12 - 46 %    Lymphs Abs 0.6 (*) 0.7 - 4.0 K/uL    Monocytes Relative 13 (*) 3 - 12 %    Monocytes Absolute 0.3  0.1 - 1.0 K/uL    Eosinophils Relative 5  0 - 5 %    Eosinophils Absolute 0.1  0.0 - 0.7 K/uL    Basophils Relative 1  0 - 1 %    Basophils Absolute 0.0  0.0 - 0.1 K/uL   VANCOMYCIN, RANDOM     Status: Normal   Collection Time   08/20/12  3:10 AM      Component Value Range Comment   Vancomycin Rm 13.3     SAVE SMEAR     Status: Normal   Collection Time   08/20/12  3:44 AM      Component Value Range Comment   Smear  Review SMEAR STAINED AND AVAILABLE FOR REVIEW     POTASSIUM     Status: Normal   Collection Time   08/20/12 10:29 AM      Component Value Range Comment   Potassium 5.0  3.5 - 5.1 mEq/L    Constitutional: positive for chills, fatigue and fevers, negative for sweats and weight loss Ears, nose, mouth, throat, and face: negative for earaches, hoarseness, nasal congestion and sore throat Respiratory: negative for cough, dyspnea on exertion, sputum and wheezing Cardiovascular: negative for chest pain, chest pressure/discomfort, dyspnea, orthopnea and palpitations Gastrointestinal: negative for abdominal pain, change in bowel habits, nausea and vomiting Genitourinary:negative, oliguric Musculoskeletal:negative for arthralgias, back pain, muscle weakness and myalgias Neurological: negative for coordination problems, dizziness, gait problems, headaches and speech problems  Physical Exam: Filed Vitals:   08/20/12 0525  BP: 119/48  Pulse: 89  Temp: 99 F (37.2 C)  Resp: 18     General appearance: alert, cooperative and no distress Head: Normocephalic, without obvious abnormality, atraumatic Neck: no adenopathy, no carotid bruit, no JVD and supple, symmetrical, trachea midline Resp: clear to auscultation bilaterally Cardio: RRR with Gr II/VI systolic murmur GI: soft, non-tender; bowel sounds normal; no masses,  no organomegaly Extremities: extremities normal, atraumatic, no cyanosis or edema Neurologic: Grossly normal Dialysis Access: left IJ catheter   Assessment/Plan: 1. Fever - unknown origin, WBCs low 1.9, chest x-ray negative, UA negative, 1 of 2 blood cultures + for Micrococcus luteus on 9/9, remained on Vancomycin per HD (now x 7).  Recheck blood cultures, remove dialysis catheter after this next HD (asked VVS to remove tonight or in am).  Replace catheter Thursday or Friday. Suspect he has an HD catheter-  associated infection, need to watch for potential other sites of extravascular  seeding of infection.   2. ESRD -  HD on MWF in [place]; K 5 today.  HD today (off schedule) to remove catheter post-Tx. 3. Hypertension/volume  - BP 119/48, receives Midodrine 10 mg prior to HD as outpatient; current wt 78.8 kg s/p HD yesterday, EDW 76 kg. 4. Anemia  - Hgb 8.7 on outpatient Epogen 11,000 U and Venofer qwk.  Aranesp 200 mg and IV Fe today. 5. Metabolic bone disease -  Ca 8.8, on Zemplar 8 mcg, Sensipar 30 mg qd, and Renvela 3 with meals, 2 with snacks. 6. Nutrition - Alb 3.3. 7. 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 8. Hx CAD - s/p CABG x4 in '98.  [name],[name] 08/20/2012, 12:46 PM   Attending Nephrologist: Delano Metz, MD  Patient seen and examined and agree with assessment and plan as above with additions as indicated.  Vinson Moselle  MD [employer] [phone] pgr    [phone] cell 08/20/2012, 5:08 PM

## 2012-08-20 NOTE — ED Provider Notes (Signed)
History     CSN: [id number]  Arrival date & time 08/19/12  2146   First MD Initiated Contact with Patient 08/20/12 0030      Chief Complaint  Patient presents with  . Fever    (Consider location/radiation/quality/duration/timing/severity/associated sxs/prior treatment) The history is limited by the condition of the patient.   HX per PT abd daughter, fever x 2 weeks getting dialysis with ABx for unk source of infection, dialysis today and ABx and tylenol. Developed fever to 101 despite this with some AMS now improved.  Family spoke with Dr Hyman Hopes who recommedned ER evaluation and may need to be admitted for removal of dialysis access L subclavian, mod in severity. No coughing, no ABD pain. No SOB now. No rashes. Does not make any sig amnt of urine. No back pain.  Past Medical History  Diagnosis Date  . Secondary hyperparathyroidism   . Peripheral vascular disease   . Hyperlipidemia   . Duodenal ulcer     remote  . Gout STABLE  . ESRD (end stage renal disease)     Hemodialysis since 2003; Nephrologist-Dr. Caryn Section  . Arteriosclerotic cardiovascular disease (ASCVD) 1998    CABG-1998; h/o CHF; Atherosclerosis of great vessels, aorta and coronaries on CT in 02/2012  . Cerebrovascular disease   . Neuropathy of foot   . Degenerative joint disease   . Gastroesophageal reflux disease   . History of bladder cancer   . Impaired hearing     Bilateral; hearing aids relatively ineffective  . Anemia     Prior blood transfusion  . Renal cell carcinoma 2001    s/p right nephrectomy-2001; subsequent ESRD  . Hypertension   . Cholelithiasis 03/2011    Diagnosed incidentally on abdominal ultrasound in 03/2011  . Hepatic steatosis     Past Surgical History  Procedure Date  . Right nephrectomy 2001    Renal cell carcinoma  . Colonoscopy 01/24/2011    prominent vascular pattern, suboptimal prep but doable  . Esophagogastroduodenoscopy 01/24/2011    mild erosive reflux esophagitis, bulbar/antral  erosions, bx from antrum benign  . Av fistula repair 2008    revision of anastomosis of Right AVF  . Arteriovenous graft placement 2006    Thrombectomy and interposition jump graft revision to higher axillary vein of LUA AVG  . Coronary artery bypass graft 1998    X4 VESSELS  . Multiple cysto/ resection tumor's with bx's LAST ONE 05-09-2011  . Multiple surg's / interventions for avgg/ fistula right upper arm LAST REVISION 06-29-2011    (JUNE 2010 STENT CEPHALIC VEIN/ 03-23-2011 DILATATION ANGIOPLASTY)  . Left ptc left renal artery   . Cardiac catheterization 2004  . Cataract extraction w/ intraocular lens  implant, bilateral   . Cystoscopy w/ retrogrades 12/28/2011    Procedure: CYSTOSCOPY WITH RETROGRADE PYELOGRAM;  Surgeon: [name], MD;  Location: [facility];  Service: Urology;  Laterality: Left;  C-ARM   . Transurethral resection of bladder tumor 12/28/2011    Procedure: TRANSURETHRAL RESECTION OF BLADDER TUMOR (TURBT);  Surgeon: [name], MD;  Location: [facility];  Service: Urology;  Laterality: N/A;  . Av fistula placement 02/29/2012    Procedure: INSERTION OF ARTERIOVENOUS (AV) GORE-TEX GRAFT THIGH;  Surgeon: [name], MD;  Location: [facility] OR;  Service: Vascular;  Laterality: Right;  Insertion right femoral arteriovenous gortex graft  . Ligation of right braciocephalic av fistula 04-04-2012  . Insertion of dialysis catheter 06/05/2012    Procedure: INSERTION OF  DIALYSIS CATHETER;  Surgeon: Pryor Ochoa, MD;  Location: [facility] OR;  Service: Vascular;  Laterality: Left;  Insertion diatek catheter left IJ    Family History  Problem Relation Age of Onset  . Colon cancer Neg Hx   . Liver disease Neg Hx   . Anesthesia problems Neg Hx   . Heart disease Father     History  Substance Use Topics  . Smoking status: Former Smoker -- 1.0 packs/day for 25 years    Types: Cigarettes    Quit date: 02/26/1984  . Smokeless tobacco: Never Used   Comment:  quit 1985  . Alcohol Use: No      Review of Systems  Constitutional: Positive for fever. Negative for chills.  HENT: Negative for neck pain and neck stiffness.   Eyes: Negative for pain.  Respiratory: Negative for shortness of breath.   Cardiovascular: Negative for chest pain.  Gastrointestinal: Negative for abdominal pain.  Genitourinary: Negative for dysuria.  Musculoskeletal: Negative for back pain.  Skin: Negative for rash.  Neurological: Negative for headaches.  All other systems reviewed and are negative.    Allergies  Review of patient's allergies indicates no known allergies.  Home Medications   Current Outpatient Rx  Name Route Sig Dispense Refill  . ACETAMINOPHEN 500 MG PO TABS Oral Take 1,000 mg by mouth every 6 (six) hours as needed. For pain/headache    . ALLOPURINOL 100 MG PO TABS Oral Take 100 mg by mouth at bedtime.     . ASPIRIN EC 81 MG PO TBEC Oral Take 81 mg by mouth at bedtime.     . ATORVASTATIN CALCIUM 10 MG PO TABS Oral Take 10 mg by mouth every morning.     Marland Kitchen DIALYVITE PO TABS Oral Take 1 tablet by mouth at bedtime.     Marland Kitchen CALCIUM CARBONATE ANTACID 500 MG PO CHEW Oral Chew 1 tablet by mouth daily as needed. For upset stomach    . CINACALCET HCL 30 MG PO TABS Oral Take 30 mg by mouth daily with breakfast.     . COLCHICINE 0.6 MG PO TABS Oral Take 0.6 mg by mouth 2 (two) times daily as needed. for gout    . GABAPENTIN 100 MG PO CAPS Oral Take 100 mg by mouth 2 (two) times daily.     Marland Kitchen OMEPRAZOLE 40 MG PO CPDR Oral Take 40 mg by mouth at bedtime. For reflux    . POLYETHYLENE GLYCOL 3350 PO POWD Oral Take 17 g by mouth at bedtime.     Marland Kitchen SEVELAMER CARBONATE 800 MG PO TABS Oral Take 1,600-3,200 mg by mouth 5 (five) times daily. 4 tabs 3 x daily and 2 with snack      BP 153/70  Pulse 87  Temp 99.4 F (37.4 C) (Oral)  Resp 18  Ht 5\' 8"  (1.727 m)  Wt 171 lb (77.565 kg)  BMI 26.00 kg/m2  SpO2 100%  Physical Exam  Constitutional: He is oriented to  person, place, and time. He appears well-developed and well-nourished.  HENT:  Head: Normocephalic and atraumatic.  Eyes: Conjunctivae normal and EOM are normal. Pupils are equal, round, and reactive to light.  Neck: Trachea normal. Neck supple. No thyromegaly present.  Cardiovascular: Normal rate, regular rhythm, S1 normal, S2 normal and normal pulses.     No systolic murmur is present   No diastolic murmur is present  Pulses:      Radial pulses are 2+ on the right side, and 2+ on the  left side.  Pulmonary/Chest: Effort normal and breath sounds normal. He has no wheezes. He has no rhonchi. He has no rales. He exhibits no tenderness.       L subclavian access bandaged, no tenderness or erythema.   Abdominal: Soft. Normal appearance and bowel sounds are normal. There is no tenderness. There is no CVA tenderness and negative Murphy's sign.  Musculoskeletal:       BLE:s Calves nontender, no cords or erythema, negative Homans sign  Neurological: He is alert and oriented to person, place, and time. He has normal strength. No cranial nerve deficit or sensory deficit. GCS eye subscore is 4. GCS verbal subscore is 5. GCS motor subscore is 6.  Skin: Skin is warm and dry. No rash noted. He is not diaphoretic.  Psychiatric: His speech is normal.       Cooperative and appropriate    ED Course  Procedures (including critical care time)  Results for orders placed during the hospital encounter of 08/19/12  CBC WITH DIFFERENTIAL      Component Value Range   WBC 2.4 (*) 4.0 - 10.5 K/uL   RBC 2.60 (*) 4.22 - 5.81 MIL/uL   Hemoglobin 8.9 (*) 13.0 - 17.0 g/dL   HCT 81.1 (*) 91.4 - 78.2 %   MCV 106.2 (*) 78.0 - 100.0 fL   MCH 34.2 (*) 26.0 - 34.0 pg   MCHC 32.2  30.0 - 36.0 g/dL   RDW 95.6 (*) 21.3 - 08.6 %   Platelets 107 (*) 150 - 400 K/uL   Neutrophils Relative 52  43 - 77 %   Lymphocytes Relative 29  12 - 46 %   Monocytes Relative 13 (*) 3 - 12 %   Eosinophils Relative 6 (*) 0 - 5 %    Basophils Relative 0  0 - 1 %   Neutro Abs 1.3 (*) 1.7 - 7.7 K/uL   Lymphs Abs 0.7  0.7 - 4.0 K/uL   Monocytes Absolute 0.3  0.1 - 1.0 K/uL   Eosinophils Absolute 0.1  0.0 - 0.7 K/uL   Basophils Absolute 0.0  0.0 - 0.1 K/uL  COMPREHENSIVE METABOLIC PANEL      Component Value Range   Sodium 137  135 - 145 mEq/L   Potassium 4.7  3.5 - 5.1 mEq/L   Chloride 95 (*) 96 - 112 mEq/L   CO2 31  19 - 32 mEq/L   Glucose, Bld 99  70 - 99 mg/dL   BUN 32 (*) 6 - 23 mg/dL   Creatinine, Ser 5.78 (*) 0.50 - 1.35 mg/dL   Calcium 9.0  8.4 - 46.9 mg/dL   Total Protein 6.6  6.0 - 8.3 g/dL   Albumin 3.3 (*) 3.5 - 5.2 g/dL   AST 46 (*) 0 - 37 U/L   ALT 32  0 - 53 U/L   Alkaline Phosphatase 173 (*) 39 - 117 U/L   Total Bilirubin 0.4  0.3 - 1.2 mg/dL   GFR calc non Af Amer 9 (*) >90 mL/min   GFR calc Af Amer 10 (*) >90 mL/min   Dg Chest 2 View  08/19/2012  *RADIOLOGY REPORT*  Clinical Data: 78 year old male fever.  History of CABG, right nephrectomy.  CHEST - 2 VIEW  Comparison: 06/04/2012.  Findings: Tunneled left chest dual lumen dialysis type catheter in place.  Sequelae of CABG.  Epigastric surgical clips.  Left axillary surgical clips.  Metal vascular stent is stable in the distal right subclavian/axillary region .  No  pneumothorax, pulmonary edema, pleural effusion or confluent pulmonary opacity. No acute osseous abnormality identified.   Stable cardiac size and mediastinal contours.  IMPRESSION: No acute cardiopulmonary abnormality.   Original Report Authenticated By: Harley Hallmark, M.D.    D/w [facility] hospitalist,  - plan admit.   MDM   ESRD PT with fevers, does not make urine, has L [place] dialysis access as possible source. Iv ABx and MED admit. CXR and labs as above. VS and nursing notes reviewed.         Sunnie Nielsen, MD 08/22/12 [phone]

## 2012-08-21 DIAGNOSIS — I10 Essential (primary) hypertension: Secondary | ICD-10-CM

## 2012-08-21 DIAGNOSIS — D649 Anemia, unspecified: Secondary | ICD-10-CM

## 2012-08-21 LAB — RENAL FUNCTION PANEL
CO2: 27 mEq/L (ref 19–32)
GFR calc Af Amer: 10 mL/min — ABNORMAL LOW (ref >90)
Glucose, Bld: 99 mg/dL (ref 70–99)
Potassium: 4.9 mEq/L (ref 3.5–5.1)
Sodium: 136 mEq/L (ref 135–145)

## 2012-08-21 LAB — URINE MICROSCOPIC-ADD ON

## 2012-08-21 LAB — CBC
Hemoglobin: 8.1 g/dL — ABNORMAL LOW (ref 13.0–17.0)
MCH: 33.9 pg (ref 26.0–34.0)
RBC: 2.39 MIL/uL — ABNORMAL LOW (ref 4.22–5.81)

## 2012-08-21 LAB — URINALYSIS, ROUTINE W REFLEX MICROSCOPIC
Glucose, UA: NEGATIVE mg/dL
Leukocytes, UA: NEGATIVE
Nitrite: NEGATIVE
Specific Gravity, Urine: 1.008 (ref 1.005–1.030)
pH: 8 (ref 5.0–8.0)

## 2012-08-21 MED ORDER — LIDOCAINE HCL (CARDIAC) 20 MG/ML IV SOLN
INTRAVENOUS | Status: AC
  Filled 2012-08-21: qty 5

## 2012-08-21 MED ORDER — LIDOCAINE HCL (PF) 1 % IJ SOLN
INTRAMUSCULAR | Status: AC
  Filled 2012-08-21: qty 5

## 2012-08-21 MED ORDER — AMLODIPINE BESYLATE 5 MG PO TABS
5.0000 mg | ORAL_TABLET | Freq: Every day | ORAL | Status: DC
  Administered 2012-08-21 – 2012-08-22 (×2): 5 mg via ORAL
  Filled 2012-08-21 (×3): qty 1

## 2012-08-21 NOTE — Progress Notes (Signed)
Labs reviewed today. Counts are slightly lower but no dramatic change.  No change in recommendations yet. I still think there is still an element of acute illness causing this.  Will continue to monitor.

## 2012-08-21 NOTE — Progress Notes (Addendum)
[facility] PROGRESS NOTE  Andrew [name] UUV:[id number] DOB: 07/18/1926 DOA: 08/19/2012 PCP: [name]., MD  Assessment/Plan: Principal Problem:  *Fever Active Problems:  Pancytopenia  ESRD (end stage renal disease) on dialysis  CAD (coronary artery disease)  1.  #1. Fever - unclear source probably HD catheter. Blood cultures has been obtained already. UA AND urine cultures pending.  patient empirically on vancomycin cefepime . HD catheter removed after dialysis and a new TDC will be put in by vascular probably tomorrow.   #2. ESRD on hemodialysis Monday Wednesday and Friday - patient is not acutely short of breath. Renal on board for dialysis..  #3. Chronic pancytopenia -  we have discussed with patient and family the patient will eventually need followup with hematologist for possibly BM biopsy, if the counts doesn't improve. Hematology consult obtained and suggested the pancytopenia could be secondary to acute illness and to monitor the counts for now.    #4. CAD status post CABG - presently chest pain-free.  #5. History of gout - continue present medications.  #6. History of renal cell carcinoma status post nephrectomy. #7 hyperkalemia: hemolysed sample, repeat K WAS 5.  #8 Hypertension: not well controlled. Resume home medications.   Code Status: full code Family Communication: none at bedside Disposition Plan: home when medically stable.    Brief narrative:  78 year old male with known history of ESRD on hemodialysis on Monday Wednesday and Friday, history of CAD status post CABG, gout, nephrectomy for renal cell cancer, chronic pancytopenia has been experiencing fever for last 2 weeks. Patient was initially noticed to have fever after dialysis 2 weeks ago and as per daughter the fever was around 104F. Patient was started on empiric antibiotics during dialysis after obtaining cultures. We don't have access to the culture results. Since patient had still persistent  fever patient was instructed to come to the ER. In the ER patient was mildly febrile with chronic pancytopenia. Chest x-ray was not showing anything acute. At this time the source of fever is not clear. Blood culture has been obtained patient admitted for further observation and management.  Consultants:  Renal consult  Hematology consult  Procedures:  Hd today  Antibiotics:  Vancomycin 9/24  Cefepime 9/24  HPI/Subjective: Exhausted, tired.   Objective: Filed Vitals:   08/20/12 2005 08/20/12 2146 08/21/12 0500 08/21/12 1300  BP: 165/68 150/70 137/64 163/76  Pulse: 95  94 85  Temp: 98.6 F (37 C)  98.1 F (36.7 C) 99.4 F (37.4 C)  TempSrc: Oral  Oral Oral  Resp: 20  18 18   Height:      Weight:   75.9 kg (167 lb 5.3 oz)   SpO2: 98%  97% 98%    Intake/Output Summary (Last 24 hours) at 08/21/12 1912 Last data filed at 08/21/12 1630  Gross per 24 hour  Intake    726 ml  Output      0 ml  Net    726 ml   Filed Weights   08/20/12 1515 08/20/12 1844 08/21/12 0500  Weight: 77.3 kg (170 lb 6.7 oz) 74.5 kg (164 lb 3.9 oz) 75.9 kg (167 lb 5.3 oz)    Exam:   General:  Alert low grade fever, comfortable, very hard of hearing.  Cardiovascular: s1s2   Respiratory: CTAB  Abdomen: Soft NT ND BS+  Extremities: no cyanosis or pedal edema  Neuro: no focal deficits.   Data Reviewed: Basic Metabolic Panel:  Lab 08/21/12 4742 08/20/12 1029 08/20/12 0310 08/19/12 2158  NA  136 -- 137 137  K 4.9 5.0 6.1* 4.7  CL 97 -- 95* 95*  CO2 27 -- 29 31  GLUCOSE 99 -- 139* 99  BUN 31* -- 38* 32*  CREATININE 5.35* -- 5.76* 5.29*  CALCIUM 9.4 -- 8.8 9.0  MG -- -- -- --  PHOS 3.5 -- -- --   Liver Function Tests:  Lab 08/21/12 0935 08/20/12 0310 08/19/12 2158  AST -- 75* 46*  ALT -- 34 32  ALKPHOS -- 165* 173*  BILITOT -- 0.4 0.4  PROT -- 6.8 6.6  ALBUMIN 3.0* 3.3* 3.3*   No results found for this basename: LIPASE:5,AMYLASE:5 in the last 168 hours No results found for  this basename: AMMONIA:5 in the last 168 hours CBC:  Lab 08/21/12 0935 08/20/12 0310 08/19/12 2158  WBC 1.7* 1.9* 2.4*  NEUTROABS -- 0.9* 1.3*  HGB 8.1* 8.7* 8.9*  HCT 25.3* 27.4* 27.6*  MCV 105.9* 106.2* 106.2*  PLT 72* 88* 107*   Cardiac Enzymes: No results found for this basename: CKTOTAL:5,CKMB:5,CKMBINDEX:5,TROPONINI:5 in the last 168 hours BNP (last 3 results)  Basename 04/21/12 1620  PROBNP 25486.0*   CBG: No results found for this basename: GLUCAP:5 in the last 168 hours  Recent Results (from the past 240 hour(s))  CULTURE, BLOOD (ROUTINE X 2)     Status: Normal (Preliminary result)   Collection Time   08/20/12  1:10 AM      Component Value Range Status Comment   Specimen Description BLOOD LEFT ARM   Final    Special Requests BOTTLES DRAWN AEROBIC AND ANAEROBIC 5CC EA   Final    Culture  Setup Time 08/20/2012 11:26   Final    Culture     Final    Value:        BLOOD CULTURE RECEIVED NO GROWTH TO DATE CULTURE WILL BE HELD FOR 5 DAYS BEFORE ISSUING A FINAL NEGATIVE REPORT   Report Status PENDING   Incomplete   CULTURE, BLOOD (ROUTINE X 2)     Status: Normal (Preliminary result)   Collection Time   08/20/12  1:20 AM      Component Value Range Status Comment   Specimen Description BLOOD LEFT ARM   Final    Special Requests BOTTLES DRAWN AEROBIC AND ANAEROBIC 5CC   Final    Culture  Setup Time 08/20/2012 11:25   Final    Culture     Final    Value:        BLOOD CULTURE RECEIVED NO GROWTH TO DATE CULTURE WILL BE HELD FOR 5 DAYS BEFORE ISSUING A FINAL NEGATIVE REPORT   Report Status PENDING   Incomplete      Studies: Dg Chest 2 View  08/19/2012  *RADIOLOGY REPORT*  Clinical Data: 78 year old male fever.  History of CABG, right nephrectomy.  CHEST - 2 VIEW  Comparison: 06/04/2012.  Findings: Tunneled left chest dual lumen dialysis type catheter in place.  Sequelae of CABG.  Epigastric surgical clips.  Left axillary surgical clips.  Metal vascular stent is stable in the  distal right subclavian/axillary region .  No pneumothorax, pulmonary edema, pleural effusion or confluent pulmonary opacity. No acute osseous abnormality identified.   Stable cardiac size and mediastinal contours.  IMPRESSION: No acute cardiopulmonary abnormality.   Original Report Authenticated By: Ulla Potash III, M.D.     Scheduled Meds:    . allopurinol  100 mg Oral QHS  . aspirin EC  81 mg Oral QHS  . atorvastatin  10 mg  Oral q1800  . calcium carbonate  1 tablet Oral Daily  . ceFEPime (MAXIPIME) IV  1 g Intravenous Q24H  . cinacalcet  30 mg Oral Q breakfast  . darbepoetin (ARANESP) injection - DIALYSIS  200 mcg Intravenous Once  . ferric gluconate (FERRLECIT/NULECIT) IV  125 mg Intravenous Once  . gabapentin  100 mg Oral BID  . multivitamin  1 tablet Oral QHS  . pantoprazole  40 mg Oral Q1200  . paricalcitol  8 mcg Intravenous Q M,W,F-HD  . polyethylene glycol  17 g Oral QHS  . sevelamer  3,200 mg Oral TID WC  . sodium chloride  3 mL Intravenous Q12H  . sodium chloride  3 mL Intravenous Q12H  . vancomycin  750 mg Intravenous Once   Continuous Infusions:   Principal Problem:  *Fever Active Problems:  Pancytopenia  ESRD (end stage renal disease) on dialysis  CAD (coronary artery disease)       Andrew [name]  [facility] Pager [phone] If 8PM-8AM, please contact night-coverage at [email or web address], password [facility] 08/21/2012, 7:12 PM  LOS: 2 days

## 2012-08-21 NOTE — Progress Notes (Signed)
[facility] SHORT H&P  CC: ESRD   HPI: Andrew [name] is a 78 y.o. male who has been on HD through  functioning Hemodialysis catheter in the left IJ placed by Dr. Hart Rochester in July 2013. Pt was admitted with fever and Positive blood cultures. We were asked to remove catheter today. He has had multiple accesses in the past - none of which are functional. He dialyzes M,W,F. (had HD yest in anticipation of removing catheter.  Past Medical History  Diagnosis Date  . Secondary hyperparathyroidism   . Peripheral vascular disease   . Hyperlipidemia   . Duodenal ulcer     remote  . Gout STABLE  . ESRD (end stage renal disease)     Hemodialysis since 2003; Nephrologist-Dr. Caryn Section  . Arteriosclerotic cardiovascular disease (ASCVD) 1998    CABG-1998; h/o CHF; Atherosclerosis of great vessels, aorta and coronaries on CT in 02/2012  . Cerebrovascular disease   . Neuropathy of foot   . Degenerative joint disease   . Gastroesophageal reflux disease   . History of bladder cancer   . Impaired hearing     Bilateral; hearing aids relatively ineffective  . Anemia     Prior blood transfusion  . Renal cell carcinoma 2001    s/p right nephrectomy-2001; subsequent ESRD  . Hypertension   . Cholelithiasis 03/2011    Diagnosed incidentally on abdominal ultrasound in 03/2011  . Hepatic steatosis     FH:  Non-Contributory  Social HX History  Substance Use Topics  . Smoking status: Former Smoker -- 1.0 packs/day for 25 years    Types: Cigarettes    Quit date: 02/26/1984  . Smokeless tobacco: Never Used   Comment: quit 1985  . Alcohol Use: No    Allergies No Known Allergies  Medications Current Facility-Administered Medications  Medication Dose Route Frequency Provider Last Rate Last Dose  . acetaminophen (TYLENOL) tablet 650 mg  650 mg Oral Q6H PRN Eduard Clos, MD       Or  . acetaminophen (TYLENOL) suppository 650 mg  650 mg Rectal Q6H PRN Eduard Clos, MD      .  allopurinol (ZYLOPRIM) tablet 100 mg  100 mg Oral QHS Eduard Clos, MD   100 mg at 08/20/12 2126  . aspirin EC tablet 81 mg  81 mg Oral QHS Eduard Clos, MD   81 mg at 08/20/12 2126  . atorvastatin (LIPITOR) tablet 10 mg  10 mg Oral q1800 Eduard Clos, MD   10 mg at 08/20/12 2010  . calcium carbonate (TUMS - dosed in mg elemental calcium) chewable tablet 200 mg of elemental calcium  1 tablet Oral Daily Eduard Clos, MD   200 mg of elemental calcium at 08/20/12 1059  . ceFEPIme (MAXIPIME) 1 g in dextrose 5 % 50 mL IVPB  1 g Intravenous Q24H Madolyn Frieze, PHARMD   1 g at 08/20/12 2157  . cinacalcet (SENSIPAR) tablet 30 mg  30 mg Oral Q breakfast Eduard Clos, MD   30 mg at 08/21/12 0744  . colchicine tablet 0.6 mg  0.6 mg Oral BID PRN Eduard Clos, MD      . darbepoetin [facility]) injection 200 mcg  200 mcg Intravenous Once Gerome Apley, PA   200 mcg at 08/20/12 1938  . ferric gluconate (NULECIT) 125 mg in sodium chloride 0.9 % 100 mL IVPB  125 mg Intravenous Once Gerome Apley, PA      . gabapentin (NEURONTIN)  capsule 100 mg  100 mg Oral BID Eduard Clos, MD   100 mg at 08/20/12 2126  . multivitamin (RENA-VIT) tablet 1 tablet  1 tablet Oral QHS Eduard Clos, MD   1 tablet at 08/20/12 2126  . ondansetron (ZOFRAN) tablet 4 mg  4 mg Oral Q6H PRN Eduard Clos, MD       Or  . ondansetron [facility]) injection 4 mg  4 mg Intravenous Q6H PRN Eduard Clos, MD      . pantoprazole (PROTONIX) EC tablet 40 mg  40 mg Oral Q1200 Eduard Clos, MD   40 mg at 08/20/12 1059  . paricalcitol (ZEMPLAR) injection 8 mcg  8 mcg Intravenous Q M,W,F-HD Gerome Apley, PA      . polyethylene glycol (MIRALAX / GLYCOLAX) packet 17 g  17 g Oral QHS Eduard Clos, MD   17 g at 08/20/12 2126  . sevelamer (RENVELA) tablet 1,600 mg  1,600 mg Oral BID PRN Eduard Clos, MD      . sevelamer (RENVELA) tablet 3,200 mg  3,200 mg Oral TID WC  Eduard Clos, MD   3,200 mg at 08/21/12 0720  . sodium chloride 0.9 % injection 3 mL  3 mL Intravenous Q12H Eduard Clos, MD   3 mL at 08/20/12 2126  . sodium chloride 0.9 % injection 3 mL  3 mL Intravenous Q12H Eduard Clos, MD   3 mL at 08/20/12 2200  . vancomycin (VANCOCIN) 750 mg in sodium chloride 0.9 % 150 mL IVPB  750 mg Intravenous Once Madolyn Frieze, PHARMD   750 mg at 08/20/12 2127  . DISCONTD: lidocaine (cardiac) 100 mg/79ml (XYLOCAINE) 20 MG/ML injection 2%           . DISCONTD: lidocaine (XYLOCAINE) 1 % injection            Facility-Administered Medications Ordered in Other Encounters  Medication Dose Route Frequency Provider Last Rate Last Dose  . albuterol (PROVENTIL) (5 MG/ML) 0.5% nebulizer solution 2.5 mg  2.5 mg Nebulization Q2H PRN Maree Krabbe, MD      . methylPREDNISolone sodium succinate (SOLU-MEDROL) 125 mg/2 mL injection 60 mg  60 mg Intravenous Once Maree Krabbe, MD        Labs COAG Lab Results  Component Value Date   INR 1.07 04/04/2012   INR 1.17 03/10/2012   INR 1.2 05/04/2009   No results found for this basename: PTT    PHYSICAL EXAM  Filed Vitals:   08/21/12 0500  BP: 137/64  Pulse: 94  Temp: 98.1 F (36.7 C)  Resp: 18    General:  WDWN in NAD HENT: WNL Eyes: Pupils equal Pulmonary: normal non-labored breathing  Cardiac: RRR, Skin: normal, no cyanosis, jaundice, pallor or bruising  Extremities without ischemic changes, no Gangrene , no cellulitis; no open wounds;   Impression: This is a 78 y.o. male who has an infected HD catheter.  Plan: Removal of Left IJ HD catheter Will need replacement catheter, thurs/Fri Cath tip sent for culture  Raeshaun Simson J 08/21/2012  9:09 AM

## 2012-08-21 NOTE — Progress Notes (Addendum)
Subjective:   No complaints, no fever or chills, left IJ catheter removed this AM.  Objective: Vital signs in last 24 hours: Temp:  [97.9 F (36.6 C)-98.8 F (37.1 C)] 98.1 F (36.7 C) (09/25 0500) Pulse Rate:  [78-103] 94  (09/25 0500) Resp:  [17-20] 18  (09/25 0500) BP: (70-165)/(45-79) 137/64 mmHg (09/25 0500) SpO2:  [96 %-98 %] 97 % (09/25 0500) Weight:  [74.5 kg (164 lb 3.9 oz)-77.3 kg (170 lb 6.7 oz)] 75.9 kg (167 lb 5.3 oz) (09/25 0500) Weight change: -0.265 kg (-9.4 oz)  Intake/Output from previous day: 09/24 0701 - 09/25 0700 In: 546 [P.O.:540; I.V.:6] Out: 1459  Intake/Output this shift: Total I/O In: 240 [P.O.:240] Out: -  EXAM: General appearance:  Alert, in no apparent distress Resp:  CTA without rales, rhonchi, or wheezes Cardio:  RRR with Gr II/VI systolic murmur GI:  + BS, soft and nontender Extremities:  No edema Access:  Left IJ catheter removed this AM  Lab Results:  Basename 08/21/12 0935 08/20/12 0310  WBC 1.7* 1.9*  HGB 8.1* 8.7*  HCT 25.3* 27.4*  PLT 72* 88*   BMET:  Basename 08/20/12 1029 08/20/12 0310 08/19/12 2158  NA -- 137 137  K 5.0 6.1* --  CL -- 95* 95*  CO2 -- 29 31  GLUCOSE -- 139* 99  BUN -- 38* 32*  CREATININE -- 5.76* 5.29*  CALCIUM -- 8.8 9.0  ALBUMIN -- 3.3* 3.3*   No results found for this basename: PTH:2 in the last 72 hours Iron Studies: No results found for this basename: IRON,TIBC,TRANSFERRIN,FERRITIN in the last 72 hours  Dialysis Orders: Center: [facility] on MWF.  EDW 76 kg HD Bath 2k/2.25Ca Time 4 hrs Heparin 3000. Access Left IJ catheter BFR 400 DFR 800  Zemplar 8 mcg IV/HD Epogen 11,000 Units IV/HD Venofer 50 mg on Wed.   Assessment/Plan: 1. Fever - unknown origin, suspected catheter sepsis. WBCs low 1.9, chest x-ray negative, UA negative, 1 of 2 outpatient blood cultures + for Micrococcus luteus on 9/9, remained on Vancomycin per HD (now x 7); new blood cultures pending; left IJ catheter removed this am  (9/25) with tip culture pending; has failed R thigh AVG (placed 02/29/12) and left and right old arm accesses which were examined without evidence of infection grossly. He will get new tunneled IJ cath per [facility] tomorrow I am told. 2. ESRD - HD on MWF in [place]; K 5 pre-HD yesterday.  Next HD on 9/27 3. Hypertension/volume - BP 137/64, receives Midodrine 10 mg prior to HD as outpatient; wt 75.9 kg s/p HD yesterday with net UF of 1.5 L, EDW 76 kg. 4. Anemia - Hgb down to 8.1 on outpatient Epogen 11,000 U and Venofer qwk, s/p Aranesp 200 mg and IV Fe. 5. Metabolic bone disease - Ca 8.8, on Zemplar 8 mcg, Sensipar 30 mg qd, and Renvela 3 with meals, 2 with snacks. 6. Nutrition - Alb 3.3. 7. 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 8. Hx CAD - s/p CABG x4 in '98.  LOS: 2 days   [name],[name] 08/21/2012,11:16 AM  Patient seen and examined and agree with assessment and plan as above with additions as indicated.  Vinson Moselle  MD [employer] [phone] pgr    [phone] cell 08/21/2012, 2:30 PM

## 2012-08-21 NOTE — Progress Notes (Signed)
[facility] Catheter Removal Procedure Note  Diagnosis: ESRD with infected catheter  Plan:  Remove left diatek catheter  Consent signed:  yes Time out completed:  yes Coumadin:  no PT/INR (if applicable):   Other labs:   Procedure: 1.  Sterile prepping and draping over catheter area 2. 8 ml 2% lidocaine plain instilled at removal site. 3.  left catheter removed in its entirety with cuff in tact. 4.  Complications: none  5. Tip of catheter sent for culture:  yes   Patient tolerated procedure well:  yes Pressure held, no bleeding noted, dressing applied Instructions given to the pt regarding wound care and bleeding.  Other:  Marlowe Shores 08/21/2012 9:14 AM

## 2012-08-22 ENCOUNTER — Inpatient Hospital Stay (HOSPITAL_COMMUNITY): Payer: Medicare Other

## 2012-08-22 ENCOUNTER — Inpatient Hospital Stay (HOSPITAL_COMMUNITY): Payer: Medicare Other | Admitting: Anesthesiology

## 2012-08-22 ENCOUNTER — Encounter (HOSPITAL_COMMUNITY): Payer: Self-pay | Admitting: Anesthesiology

## 2012-08-22 ENCOUNTER — Encounter (HOSPITAL_COMMUNITY)
Admission: EM | Disposition: A | Discharge status: Home / Self Care | Payer: Self-pay | Source: Home / Self Care | Attending: Internal Medicine

## 2012-08-22 DIAGNOSIS — N186 End stage renal disease: Secondary | ICD-10-CM

## 2012-08-22 HISTORY — PX: INSERTION OF DIALYSIS CATHETER: SHX1324

## 2012-08-22 LAB — URINE CULTURE: Colony Count: 9000

## 2012-08-22 LAB — CBC
Platelets: 72 10*3/uL — ABNORMAL LOW (ref 150–400)
RDW: 16.1 % — ABNORMAL HIGH (ref 11.5–15.5)
WBC: 1.7 10*3/uL — ABNORMAL LOW (ref 4.0–10.5)

## 2012-08-22 LAB — DIFFERENTIAL
Basophils Relative: 0 % (ref 0–1)
Eosinophils Relative: 8 % — ABNORMAL HIGH (ref 0–5)
Lymphocytes Relative: 44 % (ref 12–46)
Lymphs Abs: 0.8 10*3/uL (ref 0.7–4.0)
Monocytes Absolute: 0.3 10*3/uL (ref 0.1–1.0)

## 2012-08-22 LAB — BASIC METABOLIC PANEL
Calcium: 8.6 mg/dL (ref 8.4–10.5)
Creatinine, Ser: 7.66 mg/dL — ABNORMAL HIGH (ref 0.50–1.35)
GFR calc Af Amer: 7 mL/min — ABNORMAL LOW (ref >90)

## 2012-08-22 SURGERY — INSERTION OF DIALYSIS CATHETER
Anesthesia: Monitor Anesthesia Care | Laterality: Left | Wound class: Clean

## 2012-08-22 MED ORDER — MEPERIDINE HCL 25 MG/ML IJ SOLN: 6.2500 mg | INTRAMUSCULAR | Status: DC | PRN

## 2012-08-22 MED ORDER — SODIUM CHLORIDE 0.9 % IR SOLN
Status: DC | PRN
  Administered 2012-08-22: 10:00:00

## 2012-08-22 MED ORDER — VANCOMYCIN HCL 1000 MG IV SOLR
750.0000 mg | INTRAVENOUS | Status: DC
  Administered 2012-08-23: 750 mg via INTRAVENOUS
  Filled 2012-08-22 (×4): qty 750

## 2012-08-22 MED ORDER — OXYCODONE HCL 5 MG PO TABS: 5.0000 mg | ORAL_TABLET | Freq: Once | ORAL | Status: DC | PRN

## 2012-08-22 MED ORDER — PROPOFOL INFUSION 10 MG/ML OPTIME
INTRAVENOUS | Status: DC | PRN
  Administered 2012-08-22: 120 ug/kg/min via INTRAVENOUS

## 2012-08-22 MED ORDER — LIDOCAINE-EPINEPHRINE (PF) 1 %-1:200000 IJ SOLN
INTRAMUSCULAR | Status: AC
  Filled 2012-08-22: qty 10

## 2012-08-22 MED ORDER — ONDANSETRON HCL 4 MG/2ML IJ SOLN: 4.0000 mg | Freq: Once | INTRAMUSCULAR | Status: DC | PRN

## 2012-08-22 MED ORDER — HEPARIN SODIUM (PORCINE) 1000 UNIT/ML DIALYSIS
20.0000 [IU]/kg | INTRAMUSCULAR | Status: DC | PRN
  Administered 2012-08-23: 1500 [IU] via INTRAVENOUS_CENTRAL
  Filled 2012-08-22: qty 2

## 2012-08-22 MED ORDER — HEPARIN SODIUM (PORCINE) 1000 UNIT/ML IJ SOLN
INTRAMUSCULAR | Status: DC | PRN
  Administered 2012-08-22: 5000 [IU] via INTRAVENOUS

## 2012-08-22 MED ORDER — HYDROMORPHONE HCL PF 1 MG/ML IJ SOLN: 0.2500 mg | INTRAMUSCULAR | Status: DC | PRN

## 2012-08-22 MED ORDER — SODIUM CHLORIDE 0.9 % IR SOLN
Status: DC | PRN
  Administered 2012-08-22: 1000 mL

## 2012-08-22 MED ORDER — SODIUM CHLORIDE 0.9 % IV SOLN
INTRAVENOUS | Status: DC | PRN
  Administered 2012-08-22: 10:00:00 via INTRAVENOUS

## 2012-08-22 MED ORDER — HEPARIN SODIUM (PORCINE) 1000 UNIT/ML IJ SOLN
INTRAMUSCULAR | Status: AC
  Filled 2012-08-22: qty 1

## 2012-08-22 MED ORDER — OXYCODONE HCL 5 MG/5ML PO SOLN: 5.0000 mg | Freq: Once | ORAL | Status: DC | PRN

## 2012-08-22 MED ORDER — LIDOCAINE-EPINEPHRINE (PF) 1 %-1:200000 IJ SOLN
INTRAMUSCULAR | Status: DC | PRN
  Administered 2012-08-22: 11 mL

## 2012-08-22 MED ORDER — SODIUM POLYSTYRENE SULFONATE 15 GM/60ML PO SUSP
15.0000 g | Freq: Once | ORAL | Status: AC
  Administered 2012-08-22: 15 g via ORAL
  Filled 2012-08-22: qty 60

## 2012-08-22 SURGICAL SUPPLY — 47 items
ADH SKN CLS APL DERMABOND .7 (GAUZE/BANDAGES/DRESSINGS) ×1
ADH SKN CLS LQ APL DERMABOND (GAUZE/BANDAGES/DRESSINGS) ×1
BAG DECANTER FOR FLEXI CONT (MISCELLANEOUS) ×2 IMPLANT
CATH CANNON HEMO 15F 50CM (CATHETERS) IMPLANT
CATH CANNON HEMO 15FR 19 (HEMODIALYSIS SUPPLIES) IMPLANT
CATH CANNON HEMO 15FR 23CM (HEMODIALYSIS SUPPLIES) IMPLANT
CATH CANNON HEMO 15FR 31CM (HEMODIALYSIS SUPPLIES) IMPLANT
CATH CANNON HEMO 15FR 32 (HEMODIALYSIS SUPPLIES) IMPLANT
CATH CANNON HEMO 15FR 32CM (HEMODIALYSIS SUPPLIES) ×2 IMPLANT
CLOTH BEACON ORANGE TIMEOUT ST (SAFETY) ×2 IMPLANT
COVER PROBE W GEL 5X96 (DRAPES) ×2 IMPLANT
COVER SURGICAL LIGHT HANDLE (MISCELLANEOUS) ×2 IMPLANT
DERMABOND ADHESIVE PROPEN (GAUZE/BANDAGES/DRESSINGS) ×1
DERMABOND ADVANCED (GAUZE/BANDAGES/DRESSINGS) ×1
DERMABOND ADVANCED .7 DNX12 (GAUZE/BANDAGES/DRESSINGS) ×1 IMPLANT
DERMABOND ADVANCED .7 DNX6 (GAUZE/BANDAGES/DRESSINGS) IMPLANT
DRAPE C-ARM 42X72 X-RAY (DRAPES) ×2 IMPLANT
DRAPE CHEST BREAST 15X10 FENES (DRAPES) ×2 IMPLANT
GAUZE SPONGE 2X2 8PLY STRL LF (GAUZE/BANDAGES/DRESSINGS) IMPLANT
GAUZE SPONGE 4X4 16PLY XRAY LF (GAUZE/BANDAGES/DRESSINGS) ×2 IMPLANT
GLOVE BIOGEL PI IND STRL 7.5 (GLOVE) ×1 IMPLANT
GLOVE BIOGEL PI INDICATOR 7.5 (GLOVE) ×1
GLOVE SURG SS PI 7.5 STRL IVOR (GLOVE) ×2 IMPLANT
GOWN PREVENTION PLUS XXLARGE (GOWN DISPOSABLE) ×2 IMPLANT
GOWN STRL NON-REIN LRG LVL3 (GOWN DISPOSABLE) ×2 IMPLANT
KIT BASIN OR (CUSTOM PROCEDURE TRAY) ×2 IMPLANT
KIT ROOM TURNOVER OR (KITS) ×2 IMPLANT
NDL 18GX1X1/2 (RX/OR ONLY) (NEEDLE) ×1 IMPLANT
NDL HYPO 25GX1X1/2 BEV (NEEDLE) ×1 IMPLANT
NEEDLE 18GX1X1/2 (RX/OR ONLY) (NEEDLE) ×2 IMPLANT
NEEDLE HYPO 25GX1X1/2 BEV (NEEDLE) ×2 IMPLANT
NS IRRIG 1000ML POUR BTL (IV SOLUTION) ×2 IMPLANT
PACK SURGICAL SETUP 50X90 (CUSTOM PROCEDURE TRAY) ×2 IMPLANT
PAD ARMBOARD 7.5X6 YLW CONV (MISCELLANEOUS) ×4 IMPLANT
SOAP 2 % CHG 4 OZ (WOUND CARE) ×2 IMPLANT
SPONGE GAUZE 2X2 STER 10/PKG (GAUZE/BANDAGES/DRESSINGS) ×1
SUT ETHILON 3 0 PS 1 (SUTURE) ×2 IMPLANT
SUT VICRYL 4-0 PS2 18IN ABS (SUTURE) ×2 IMPLANT
SYR 20CC LL (SYRINGE) ×4 IMPLANT
SYR 30ML LL (SYRINGE) IMPLANT
SYR 5ML LL (SYRINGE) ×2 IMPLANT
SYR CONTROL 10ML LL (SYRINGE) ×2 IMPLANT
SYRINGE 10CC LL (SYRINGE) ×2 IMPLANT
TAPE CLOTH SURG 4X10 WHT LF (GAUZE/BANDAGES/DRESSINGS) ×1 IMPLANT
TOWEL OR 17X24 6PK STRL BLUE (TOWEL DISPOSABLE) ×2 IMPLANT
TOWEL OR 17X26 10 PK STRL BLUE (TOWEL DISPOSABLE) ×2 IMPLANT
WATER STERILE IRR 1000ML POUR (IV SOLUTION) ×2 IMPLANT

## 2012-08-22 NOTE — Anesthesia Preprocedure Evaluation (Addendum)
Anesthesia Evaluation  Patient identified by MRN, date of birth, ID band Patient awake    Reviewed: Allergy & Precautions, H&P , NPO status , Patient's Chart, lab work & pertinent test results  Airway Mallampati: I TM Distance: >3 FB Neck ROM: Full    Dental  (+) Teeth Intact and Dental Advisory Given   Pulmonary          Cardiovascular hypertension, Pt. on medications + CAD     Neuro/Psych    GI/Hepatic GERD-  Medicated and Controlled,  Endo/Other    Renal/GU Renal disease     Musculoskeletal   Abdominal   Peds  Hematology   Anesthesia Other Findings   Reproductive/Obstetrics                          Anesthesia Physical Anesthesia Plan  ASA: III  Anesthesia Plan: MAC   Post-op Pain Management:    Induction: Intravenous  Airway Management Planned: Natural Airway  Additional Equipment:   Intra-op Plan:   Post-operative Plan:   Informed Consent: I have reviewed the patients History and Physical, chart, labs and discussed the procedure including the risks, benefits and alternatives for the proposed anesthesia with the patient or authorized representative who has indicated his/her understanding and acceptance.     Plan Discussed with: CRNA and Surgeon  Anesthesia Plan Comments:         Anesthesia Quick Evaluation

## 2012-08-22 NOTE — Progress Notes (Signed)
Subjective:   New cath placed today. No fever, WBC still low.  Objective: Vital signs in last 24 hours: Temp:  [97 F (36.1 C)-99.4 F (37.4 C)] 97.2 F (36.2 C) (09/26 1200) Pulse Rate:  [80-85] 80  (09/26 0452) Resp:  [18] 18  (09/26 0452) BP: (142-163)/(54-76) 142/65 mmHg (09/26 0452) SpO2:  [97 %-99 %] 99 % (09/26 0452) Weight:  [76.975 kg (169 lb 11.2 oz)] 76.975 kg (169 lb 11.2 oz) (09/26 0452) Weight change: -0.325 kg (-11.5 oz)  Intake/Output from previous day: 09/25 0701 - 09/26 0700 In: 1130 [P.O.:1080; IV Piggyback:50] Out: -  Intake/Output this shift: Total I/O In: 110 [I.V.:110] Out: -  EXAM: Gen: no distress Resp:  CTA without rales, rhonchi, or wheezes Cardio:  RRR with Gr II/VI systolic murmur GI:  + BS, soft and nontender Extremities:  No edema  Lab Results:  Basename 08/22/12 0635 08/21/12 0935  WBC 1.7* 1.7*  HGB 7.4* 8.1*  HCT 22.9* 25.3*  PLT 72* 72*   BMET:   Basename 08/22/12 0635 08/21/12 0935 08/20/12 0310  NA 135 136 --  K 5.4* 4.9 --  CL 97 97 --  CO2 25 27 --  GLUCOSE 86 99 --  BUN 54* 31* --  CREATININE 7.66* 5.35* --  CALCIUM 8.6 9.4 --  ALBUMIN -- 3.0* 3.3*   No results found for this basename: PTH:2 in the last 72 hours Iron Studies: No results found for this basename: IRON,TIBC,TRANSFERRIN,FERRITIN in the last 72 hours  Dialysis Orders: Center: [place] on MWF.  EDW 76 kg HD Bath 2k/2.25Ca Time 4 hrs Heparin 3000. Access Left IJ catheter BFR 400 DFR 800  Zemplar 8 mcg IV/HD Epogen 11,000 Units IV/HD Venofer 50 mg on Wed.   Assessment/Plan: 1. Fever due to catheter sepsis. WBCs low 1.7, chest x-ray negative, UA negative, 1 of 2 outpatient blood cultures + for Micrococcus luteus on 9/9 (now x 7); new blood cultures pending; left IJ catheter removed this am (9/25) with tip culture pending; old R thigh AVG (placed 02/29/12) and left and right old arm accesses without evidence of infection. On IV Vanc and cefipime; Vanc will cover  micrococcus (spoke with ID who said it is treated like a strep infection), so will d/c cefipime. Pharmacy is dosing Vanc and I discussed with them also.  2. ESRD - HD on MWF in [place]; K 5 pre-HD yesterday.  Next HD on 9/27. Will do HD first shift on Friday in case of discharge. Use tight hep w new cath 3. Hypertension/volume - BP 137/64, receives Midodrine 10 mg prior to HD as outpatient; wt 75.9 kg s/p HD yesterday with net UF of 1.5 L, EDW 76 kg. 4. Anemia - Hgb down to 8.1 on outpatient Epogen 11,000 U and Venofer qwk, s/p Aranesp 200 mg and IV Fe. 5. Metabolic bone disease - Ca 8.8, on Zemplar 8 mcg, Sensipar 30 mg qd, and Renvela 3 with meals, 2 with snacks. 6. Nutrition - Alb 3.3. 7. 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 8. Hx CAD - s/p CABG x4 in '98.  LOS: 3 days   Kodee Ravert D 08/22/2012,12:35 PM  Patient seen and examined and agree with assessment and plan as above with additions as indicated.  Vinson Moselle  MD [facility] [phone] [phone]    [phone] cell 08/22/2012, 12:35 PM

## 2012-08-22 NOTE — Progress Notes (Signed)
Assuming care of pt at this time.  Pt lying in bed without c/o anything.  Denies pain.  No s/s of any acute distres.  Alert and oriented to person only.  Follows all commands.  Call bell in reach.  Bed alarm on.

## 2012-08-22 NOTE — Progress Notes (Signed)
Post sedation confusion, weakness.  Per daughters at bedside this is "not his normal" but is familiar post-procedure behavior.  Bed alarm turned on.  Will report to night nurse.

## 2012-08-22 NOTE — Op Note (Signed)
Procedure: Ultrasound-guided insertion of Diatek catheter  Preoperative diagnosis: End-stage renal disease  Postoperative diagnosis: Same  Anesthesia: Local with IV sedation  Operative findings: 27 cm Diatek catheter left internal jugular vein  Operative details: After obtaining informed consent, the patient was taken to the operating room. The patient was placed in supine position on the operating room table. After adequate sedation the patient's entire neck and chest were prepped and draped in usual sterile fashion. The patient was placed in Trendelenburg position. Ultrasound was used to identify the patient's left internal jugular vein. This had normal compressibility and respiratory variation. Local anesthesia was infiltrated over the left jugular vein.  Using ultrasound guidance, the left internal jugular vein was successfully cannulated.  A 0.035 J-tipped guidewire was threaded into the left internal jugular vein and into the superior vena cava followed by the inferior vena cava under fluoroscopic guidance.   Next sequential 12 and 14 dilators were placed over the guidewire into the right atrium.  A 16 French dilator with a peel-away sheath was then placed over the guidewire into the right atrium.   The guidewire and dilator were removed. A 27 cm Diatek catheter was then placed through the peel away sheath into the right atrium.  The catheter was then tunneled subcutaneously, cut to length, and the hub attached. The catheter was noted to flush and draw easily. The catheter was inspected under fluoroscopy and found with its tip to be in the right atrium without any kinks throughout its course. The catheter was sutured to the skin with nylon sutures. The neck insertion site was closed with Vicryl stitch. The catheter was then loaded with concentrated Heparin solution. A dry sterile dressing was applied.  The patient tolerated procedure well and there were no complications. Instrument sponge and  needle counts correct in the case. The patient was taken to the recovery room in stable condition. Chest x-ray will be obtained in the recovery room.  Durene Cal, MD [facility] Office: [phone] Pager: [phone]

## 2012-08-22 NOTE — Anesthesia Postprocedure Evaluation (Signed)
Anesthesia Post Note  Patient: Andrew [name]  Procedure(s) Performed: Procedure(s) (LRB): INSERTION OF DIALYSIS CATHETER (Left)  Anesthesia type: MAC  Patient location: PACU  Post pain: Pain level controlled  Post assessment: Patient's Cardiovascular Status Stable  Last Vitals:  Filed Vitals:   08/22/12 1200  BP:   Pulse:   Temp: 36.2 C  Resp:     Post vital signs: Reviewed and stable  Level of consciousness: sedated  Complications: No apparent anesthesia complications

## 2012-08-22 NOTE — Progress Notes (Signed)
[facility] PROGRESS NOTE  Andrew [name]:[id number] DOB: 1926/08/13 DOA: 08/19/2012 PCP: [name]., MD  Assessment/Plan: Principal Problem:  *Fever Active Problems:  Pancytopenia  ESRD (end stage renal disease) on dialysis  CAD (coronary artery disease)  #1. Fever - unclear source, probably from HD catheter. Blood cultures has been obtained and negative so far.  Urine cultures grew 9000. CXR negative for pneumonia.  patient empirically on vancomycin and cefepime, as the blood cultures 1 out 2 from September 9th from HD have been positive for Micrococcus Luteus and has remained on vancomycin. HD catheter removed after dialysis and the catheter tip was sent for culture.  and a new Diatek catheter will be put in by vascular today.  #2. ESRD on hemodialysis Monday Wednesday and Friday - patient is not acutely short of breath. Renal on board for dialysis..   #3. Chronic pancytopenia -  we have discussed with patient and family the patient will eventually need followup with hematologist for possibly BM biopsy, if the counts doesn't improve. Hematology consult obtained and suggested the pancytopenia could be secondary to acute illness and to monitor the counts for now.    #4. CAD status post CABG in 1998 - presently chest pain-free.    #5. History of gout - continue present medications.    #6. History of renal cell carcinoma status post nephrectomy.   #7 hyperkalemia: ordered a dose of kayexalate.   Repeat potassium later today.  #8 Hypertension: not well controlled. Resume home medications.   Code Status: full code Family Communication: daughter at bedside.  Disposition Plan: home when medically stable.    Brief narrative:  78 year old male with known history of ESRD on hemodialysis on Monday Wednesday and Friday, history of CAD status post CABG, gout, nephrectomy for renal cell cancer, chronic pancytopenia has been experiencing fever for last 2 weeks. Patient was  initially noticed to have fever after dialysis 2 weeks ago and as per daughter the fever was around 104F. Patient was started on empiric antibiotics during dialysis after obtaining cultures. We don't have access to the culture results. Since patient had still persistent fever patient was instructed to come to the ER. In the ER patient was mildly febrile with chronic pancytopenia. Chest x-ray was not showing anything acute. At this time the source of fever is not clear. Blood culture has been obtained patient admitted for further observation and management.  Consultants:  Renal consult  Hematology consult  Procedures:  Hd today  Antibiotics:  Vancomycin 9/24  Cefepime 9/24  HPI/Subjective: Exhausted, tired.   Objective: Filed Vitals:   08/21/12 1300 08/21/12 2035 08/22/12 0452 08/22/12 1100  BP: 163/76 146/54 142/65   Pulse: 85 84 80   Temp: 99.4 F (37.4 C) 98.7 F (37.1 C) 98.6 F (37 C) 97 F (36.1 C)  TempSrc: Oral Oral Oral   Resp: 18 18 18    Height:      Weight:   76.975 kg (169 lb 11.2 oz)   SpO2: 98% 97% 99%     Intake/Output Summary (Last 24 hours) at 08/22/12 1120 Last data filed at 08/22/12 1051  Gross per 24 hour  Intake   1000 ml  Output      0 ml  Net   1000 ml   Filed Weights   08/20/12 1844 08/21/12 0500 08/22/12 0452  Weight: 74.5 kg (164 lb 3.9 oz) 75.9 kg (167 lb 5.3 oz) 76.975 kg (169 lb 11.2 oz)    Exam:   General:  Alert low grade fever, comfortable, very hard of hearing.  Cardiovascular: s1s2   Respiratory: CTAB  Abdomen: Soft NT ND BS+  Extremities: no cyanosis or pedal edema  Neuro: no focal deficits.   Data Reviewed: Basic Metabolic Panel:  Lab 08/22/12 1610 08/21/12 0935 08/20/12 1029 08/20/12 0310 08/19/12 2158  NA 135 136 -- 137 137  K 5.4* 4.9 5.0 6.1* 4.7  CL 97 97 -- 95* 95*  CO2 25 27 -- 29 31  GLUCOSE 86 99 -- 139* 99  BUN 54* 31* -- 38* 32*  CREATININE 7.66* 5.35* -- 5.76* 5.29*  CALCIUM 8.6 9.4 -- 8.8 9.0    MG -- -- -- -- --  PHOS -- 3.5 -- -- --   Liver Function Tests:  Lab 08/21/12 0935 08/20/12 0310 08/19/12 2158  AST -- 75* 46*  ALT -- 34 32  ALKPHOS -- 165* 173*  BILITOT -- 0.4 0.4  PROT -- 6.8 6.6  ALBUMIN 3.0* 3.3* 3.3*   No results found for this basename: LIPASE:5,AMYLASE:5 in the last 168 hours No results found for this basename: AMMONIA:5 in the last 168 hours CBC:  Lab 08/22/12 0635 08/21/12 0935 08/20/12 0310 08/19/12 2158  WBC 1.7* 1.7* 1.9* 2.4*  NEUTROABS 0.6* -- 0.9* 1.3*  HGB 7.4* 8.1* 8.7* 8.9*  HCT 22.9* 25.3* 27.4* 27.6*  MCV 103.2* 105.9* 106.2* 106.2*  PLT 72* 72* 88* 107*   Cardiac Enzymes: No results found for this basename: CKTOTAL:5,CKMB:5,CKMBINDEX:5,TROPONINI:5 in the last 168 hours BNP (last 3 results)  Basename 04/21/12 1620  PROBNP 25486.0*   CBG: No results found for this basename: GLUCAP:5 in the last 168 hours  Recent Results (from the past 240 hour(s))  CULTURE, BLOOD (ROUTINE X 2)     Status: Normal (Preliminary result)   Collection Time   08/20/12  1:10 AM      Component Value Range Status Comment   Specimen Description BLOOD LEFT ARM   Final    Special Requests BOTTLES DRAWN AEROBIC AND ANAEROBIC 5CC EA   Final    Culture  Setup Time 08/20/2012 11:26   Final    Culture     Final    Value:        BLOOD CULTURE RECEIVED NO GROWTH TO DATE CULTURE WILL BE HELD FOR 5 DAYS BEFORE ISSUING A FINAL NEGATIVE REPORT   Report Status PENDING   Incomplete   CULTURE, BLOOD (ROUTINE X 2)     Status: Normal (Preliminary result)   Collection Time   08/20/12  1:20 AM      Component Value Range Status Comment   Specimen Description BLOOD LEFT ARM   Final    Special Requests BOTTLES DRAWN AEROBIC AND ANAEROBIC 5CC   Final    Culture  Setup Time 08/20/2012 11:25   Final    Culture     Final    Value:        BLOOD CULTURE RECEIVED NO GROWTH TO DATE CULTURE WILL BE HELD FOR 5 DAYS BEFORE ISSUING A FINAL NEGATIVE REPORT   Report Status PENDING    Incomplete   URINE CULTURE     Status: Normal   Collection Time   08/20/12 11:11 PM      Component Value Range Status Comment   Specimen Description URINE, CLEAN CATCH   Final    Special Requests NONE   Final    Culture  Setup Time 08/21/2012 09:41   Final    Colony Count 9,000 COLONIES/ML   Final  Culture INSIGNIFICANT GROWTH   Final    Report Status 08/22/2012 FINAL   Final      Studies: No results found.  Scheduled Meds:    . allopurinol  100 mg Oral QHS  . amLODipine  5 mg Oral Daily  . aspirin EC  81 mg Oral QHS  . atorvastatin  10 mg Oral q1800  . calcium carbonate  1 tablet Oral Daily  . ceFEPime (MAXIPIME) IV  1 g Intravenous Q24H  . cinacalcet  30 mg Oral Q breakfast  . ferric gluconate (FERRLECIT/NULECIT) IV  125 mg Intravenous Once  . gabapentin  100 mg Oral BID  . multivitamin  1 tablet Oral QHS  . pantoprazole  40 mg Oral Q1200  . paricalcitol  8 mcg Intravenous Q M,W,F-HD  . polyethylene glycol  17 g Oral QHS  . sevelamer  3,200 mg Oral TID WC  . sodium chloride  3 mL Intravenous Q12H  . sodium chloride  3 mL Intravenous Q12H  . sodium polystyrene  15 g Oral Once   Continuous Infusions:   Principal Problem:  *Fever Active Problems:  Pancytopenia  ESRD (end stage renal disease) on dialysis  CAD (coronary artery disease)       Clayton Jarmon  [facility] Pager [phone] If 8PM-8AM, please contact night-coverage at [email or web address], password [facility] 08/22/2012, 11:20 AM  LOS: 3 days

## 2012-08-22 NOTE — Transfer of Care (Signed)
Immediate Anesthesia Transfer of Care Note  Patient: Andrew [name]  Procedure(s) Performed: Procedure(s) (LRB) with comments: INSERTION OF DIALYSIS CATHETER (Left) - insertion of dialysis catheter left  internal jugular 27cm  Patient Location: PACU  Anesthesia Type: MAC  Level of Consciousness: awake, alert , oriented and patient cooperative  Airway & Oxygen Therapy: Patient Spontanous Breathing  Post-op Assessment: Report given to PACU RN, Post -op Vital signs reviewed and stable and Patient moving all extremities  Post vital signs: Reviewed and stable  Complications: No apparent anesthesia complications

## 2012-08-22 NOTE — H&P (Signed)
[facility] [place]   Patient name: Andrew [name] MRN: [id number] DOB: [birth year] Sex: male     Chief Complaint  Patient presents with  . Fever    HISTORY OF PRESENT ILLNESS: Left IJ diatek removed for infection.  Needs new catheter.  Past Medical History  Diagnosis Date  . Secondary hyperparathyroidism   . Peripheral vascular disease   . Hyperlipidemia   . Duodenal ulcer     remote  . Gout STABLE  . ESRD (end stage renal disease)     Hemodialysis since 2003; Nephrologist-Dr. Caryn Section  . Arteriosclerotic cardiovascular disease (ASCVD) 1998    CABG-1998; h/o CHF; Atherosclerosis of great vessels, aorta and coronaries on CT in 02/2012  . Cerebrovascular disease   . Neuropathy of foot   . Degenerative joint disease   . Gastroesophageal reflux disease   . History of bladder cancer   . Impaired hearing     Bilateral; hearing aids relatively ineffective  . Anemia     Prior blood transfusion  . Renal cell carcinoma 2001    s/p right nephrectomy-2001; subsequent ESRD  . Hypertension   . Cholelithiasis 03/2011    Diagnosed incidentally on abdominal ultrasound in 03/2011  . Hepatic steatosis     Past Surgical History  Procedure Date  . Right nephrectomy 2001    Renal cell carcinoma  . Colonoscopy 01/24/2011    prominent vascular pattern, suboptimal prep but doable  . Esophagogastroduodenoscopy 01/24/2011    mild erosive reflux esophagitis, bulbar/antral erosions, bx from antrum benign  . Av fistula repair 2008    revision of anastomosis of Right AVF  . Arteriovenous graft placement 2006    Thrombectomy and interposition jump graft revision to higher axillary vein of LUA AVG  . Coronary artery bypass graft 1998    X4 VESSELS  . Multiple cysto/ resection tumor's with bx's LAST ONE 05-09-2011  . Multiple surg's / interventions for avgg/ fistula right upper arm LAST REVISION 06-29-2011    (JUNE 2010 STENT CEPHALIC VEIN/ 03-23-2011 DILATATION ANGIOPLASTY)  .  Left ptc left renal artery   . Cardiac catheterization 2004  . Cataract extraction w/ intraocular lens  implant, bilateral   . Cystoscopy w/ retrogrades 12/28/2011    Procedure: CYSTOSCOPY WITH RETROGRADE PYELOGRAM;  Surgeon: [name], MD;  Location: [facility];  Service: Urology;  Laterality: Left;  C-ARM   . Transurethral resection of bladder tumor 12/28/2011    Procedure: TRANSURETHRAL RESECTION OF BLADDER TUMOR (TURBT);  Surgeon: [name], MD;  Location: [facility];  Service: Urology;  Laterality: N/A;  . Av fistula placement 02/29/2012    Procedure: INSERTION OF ARTERIOVENOUS (AV) GORE-TEX GRAFT THIGH;  Surgeon: [name], MD;  Location: [facility] OR;  Service: Vascular;  Laterality: Right;  Insertion right femoral arteriovenous gortex graft  . Ligation of right braciocephalic av fistula 04-04-2012  . Insertion of dialysis catheter 06/05/2012    Procedure: INSERTION OF DIALYSIS CATHETER;  Surgeon: Pryor Ochoa, MD;  Location: [facility] OR;  Service: Vascular;  Laterality: Left;  Insertion diatek catheter left IJ    History   Social History  . Marital Status: Widowed    Spouse Name: N/A    Number of Children: 3  . Years of Education: N/A   Occupational History  . Retired    Social History Main Topics  . Smoking status: Former Smoker -- 1.0 packs/day for 25 years    Types: Cigarettes    Quit  date: 02/26/1984  . Smokeless tobacco: Never Used   Comment: quit 1985  . Alcohol Use: No  . Drug Use: No  . Sexually Active: Not Currently   Other Topics Concern  . Not on file   Social History Narrative  . No narrative on file    Family History  Problem Relation Age of Onset  . Colon cancer Neg Hx   . Liver disease Neg Hx   . Anesthesia problems Neg Hx   . Heart disease Father     Allergies as of 08/19/2012  . (No Known Allergies)    Current Facility-Administered Medications on File Prior to Encounter  Medication Dose Route Frequency Provider  Last Rate Last Dose  . albuterol (PROVENTIL) (5 MG/ML) 0.5% nebulizer solution 2.5 mg  2.5 mg Nebulization Q2H PRN Maree Krabbe, MD      . methylPREDNISolone sodium succinate (SOLU-MEDROL) 125 mg/2 mL injection 60 mg  60 mg Intravenous Once Maree Krabbe, MD       Current Outpatient Prescriptions on File Prior to Encounter  Medication Sig Dispense Refill  . acetaminophen (TYLENOL) 500 MG tablet Take 1,000 mg by mouth every 6 (six) hours as needed. For pain/headache      . allopurinol (ZYLOPRIM) 100 MG tablet Take 100 mg by mouth at bedtime.       Marland Kitchen aspirin EC 81 MG tablet Take 81 mg by mouth at bedtime.       Marland Kitchen atorvastatin (LIPITOR) 10 MG tablet Take 10 mg by mouth every morning.       . B Complex-C-Folic Acid (DIALYVITE TABLET) TABS Take 1 tablet by mouth at bedtime.       . calcium carbonate (TUMS - DOSED IN MG ELEMENTAL CALCIUM) 500 MG chewable tablet Chew 1 tablet by mouth daily as needed. For upset stomach      . cinacalcet (SENSIPAR) 30 MG tablet Take 30 mg by mouth daily with breakfast.       . colchicine (COLCRYS) 0.6 MG tablet Take 0.6 mg by mouth 2 (two) times daily as needed. for gout      . gabapentin (NEURONTIN) 100 MG capsule Take 100 mg by mouth 2 (two) times daily.       Marland Kitchen omeprazole (PRILOSEC) 40 MG capsule Take 40 mg by mouth at bedtime. For reflux      . polyethylene glycol powder (MIRALAX) powder Take 17 g by mouth at bedtime.       . sevelamer (RENVELA) 800 MG tablet Take 1,600-3,200 mg by mouth 5 (five) times daily. 4 tabs 3 x daily and 2 with snack          PHYSICAL EXAMINATION:   Vital signs are BP 142/65  Pulse 80  Temp 98.6 F (37 C) (Oral)  Resp 18  Ht 5\' 8"  (1.727 m)  Wt 169 lb 11.2 oz (76.975 kg)  BMI 25.80 kg/m2  SpO2 99% General: The patient appears their stated age. HEENT:  No gross abnormalities Pulmonary:  Non labored breathing Musculoskeletal: There are no major deformities. Skin: There are no ulcer or rashes noted. Psychiatric: The  patient has normal affect. Cardiovascular: There is a regular rate and rhythm without significant murmur appreciated.    Assessment: ESRD Plan: For new access today.  Jorge Ny, M.D. [facility] [place] Office: [phone] Pager:  [phone]

## 2012-08-23 ENCOUNTER — Inpatient Hospital Stay (HOSPITAL_COMMUNITY): Payer: Medicare Other

## 2012-08-23 ENCOUNTER — Encounter (HOSPITAL_COMMUNITY): Payer: Self-pay | Admitting: Surgery

## 2012-08-23 DIAGNOSIS — I739 Peripheral vascular disease, unspecified: Secondary | ICD-10-CM

## 2012-08-23 DIAGNOSIS — N2581 Secondary hyperparathyroidism of renal origin: Secondary | ICD-10-CM

## 2012-08-23 DIAGNOSIS — I7090 Unspecified atherosclerosis: Secondary | ICD-10-CM

## 2012-08-23 DIAGNOSIS — G609 Hereditary and idiopathic neuropathy, unspecified: Secondary | ICD-10-CM

## 2012-08-23 DIAGNOSIS — I679 Cerebrovascular disease, unspecified: Secondary | ICD-10-CM

## 2012-08-23 DIAGNOSIS — C649 Malignant neoplasm of unspecified kidney, except renal pelvis: Secondary | ICD-10-CM

## 2012-08-23 DIAGNOSIS — I2510 Atherosclerotic heart disease of native coronary artery without angina pectoris: Secondary | ICD-10-CM

## 2012-08-23 LAB — RENAL FUNCTION PANEL
Albumin: 2.8 g/dL — ABNORMAL LOW (ref 3.5–5.2)
CO2: 24 mEq/L (ref 19–32)
Calcium: 8.7 mg/dL (ref 8.4–10.5)
Chloride: 96 mEq/L (ref 96–112)
Creatinine, Ser: 10.34 mg/dL — ABNORMAL HIGH (ref 0.50–1.35)
GFR calc Af Amer: 5 mL/min — ABNORMAL LOW (ref >90)
GFR calc non Af Amer: 4 mL/min — ABNORMAL LOW (ref >90)
Sodium: 135 mEq/L (ref 135–145)

## 2012-08-23 LAB — CBC
MCV: 101.3 fL — ABNORMAL HIGH (ref 78.0–100.0)
Platelets: 73 10*3/uL — ABNORMAL LOW (ref 150–400)
RBC: 2.23 MIL/uL — ABNORMAL LOW (ref 4.22–5.81)
RDW: 16 % — ABNORMAL HIGH (ref 11.5–15.5)
WBC: 2.1 10*3/uL — ABNORMAL LOW (ref 4.0–10.5)

## 2012-08-23 LAB — IRON AND TIBC
Iron: 144 ug/dL — ABNORMAL HIGH (ref 42–135)
TIBC: 258 ug/dL (ref 215–435)
UIBC: 114 ug/dL — ABNORMAL LOW (ref 125–400)

## 2012-08-23 LAB — CATH TIP CULTURE: Culture: NO GROWTH

## 2012-08-23 LAB — ABO/RH: ABO/RH(D): O POS

## 2012-08-23 LAB — PREPARE RBC (CROSSMATCH)

## 2012-08-23 MED ORDER — PARICALCITOL 5 MCG/ML IV SOLN
INTRAVENOUS | Status: AC
  Administered 2012-08-23: 8 ug via INTRAVENOUS
  Filled 2012-08-23: qty 2

## 2012-08-23 MED ORDER — MIDODRINE HCL 5 MG PO TABS
10.0000 mg | ORAL_TABLET | ORAL | Status: DC
  Administered 2012-08-23: 10 mg via ORAL
  Filled 2012-08-23 (×2): qty 2

## 2012-08-23 MED ORDER — ALTEPLASE 100 MG IV SOLR
2.0000 mg | Freq: Once | INTRAVENOUS | Status: AC
  Administered 2012-08-23: 2 mg
  Filled 2012-08-23: qty 2

## 2012-08-23 MED ORDER — ALTEPLASE 2 MG IJ SOLR
2.0000 mg | Freq: Once | INTRAMUSCULAR | Status: AC
  Administered 2012-08-23: 2 mg
  Filled 2012-08-23: qty 2

## 2012-08-23 NOTE — Progress Notes (Signed)
[facility] Progress Note  Subjective:  Nursing reports catheter flow issues - cathflo to be placed mid treatment.    Objective Filed Vitals:   08/23/12 0742 08/23/12 0745 08/23/12 0800 08/23/12 0815  BP: 142/72 142/72 132/67   Pulse: 88 88 98 104  Temp:      TempSrc:      Resp: 15 16 17 16   Height:      Weight:      SpO2:       Physical Exam  BP dropping on HD with tachycardia Goal 2.8 Qb 300 reversed General: Extremely HOH NAD Heart:tach - rate 110 Lungs: no wheezes or rales Abdomen:soft NT + BS Extremities:no LE edema Dialysis Access: left I-J  Dialysis Orders: Center: [facility] on MWF.  EDW 76 kg HD Bath 2k/2.25Ca Time 4 hrs Heparin 3000. Access Left IJ catheter BFR 400 DFR 800  Zemplar 8 mcg IV/HD Epogen 11,000 Units IV/HD Venofer 50 mg on Wed.   Assessment/Plan:  1. Micrococcus luteus sepsis WBCs low 1.7 up to 2.1, chest x-ray negative, UA negative, 1 of 2 outpatient blood cultures  on 9/9 ; new blood cultures pending - no growth; left IJ catheter removed 9/25 and replaced 9/26 with tip culture no growth so far, but was on antibioitics; old R thigh AVG (placed 02/29/12) and left and right old arm accesses without evidence of infection. (Per Dr. Arlean Hopping  ID said to treat micrococus like a strep infection),. Pharmacy is dosing Vanc  Will complete total 3 week course at dialysis. 2. ESRD - HD on MWF in [place];  Tight heparin today - catheter problems as per above renal panel pending 3. Hypertension/volume - BP 137/64, receives Midodrine 10 mg prior to HD as outpatient- on Norvasc 5 mg here;  I have discontinued norvasc; EDW 76 kg. - clear cxr post cathteter placement and added pre HD midodrine 4. AMS- since yesterday, prob TME. Apparently family said this has happened before in hospital. Not getting meds which would cause AMS.  5. Anemia - Hgb 7.4 x 2 days - will transfuse 1 unit on HD today;  on outpatient Epogen 11,000 U and Venofer qwk, s/p Aranesp 200 mg  and IV Fe. 6. Metabolic bone disease -  Zemplar 8 mcg, Sensipar 30 mg qd, and Renvela 3 with meals, 2 with snacks.- renal panel pending 7. Nutrition - Alb 3.3. - high protein renal diet 8. 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 9. Hx CAD - s/p CABG x4 in '98. 10. Thrombocytopenia - platelets stable  Sheffield Slider, PA-C [facility] Beeper [phone]  08/23/2012,8:25 AM  LOS: 4 days   Patient seen and examined and agree with assessment and plan as above with additions as indicated.  Andrew Moselle  MD [facility] [phone] pgr    [phone] cell 08/23/2012, 10:38 AM     Additional Objective Labs: Basic Metabolic Panel:  Lab 08/22/12 5784 08/21/12 0935 08/20/12 1029 08/20/12 0310  NA 135 136 -- 137  K 5.4* 4.9 5.0 --  CL 97 97 -- 95*  CO2 25 27 -- 29  GLUCOSE 86 99 -- 139*  BUN 54* 31* -- 38*  CREATININE 7.66* 5.35* -- 5.76*  CALCIUM 8.6 9.4 -- 8.8  ALB -- -- -- --  PHOS -- 3.5 -- --   Liver Function Tests:  Lab 08/21/12 0935 08/20/12 0310 08/19/12 2158  AST -- 75* 46*  ALT -- 34 32  ALKPHOS -- 165* 173*  BILITOT -- 0.4 0.4  PROT -- 6.8 6.6  ALBUMIN 3.0* 3.3* 3.3*  CBC:  Lab 08/23/12 0744 08/22/12 0635 08/21/12 0935 08/20/12 0310 08/19/12 2158  WBC 2.1* 1.7* 1.7* -- --  NEUTROABS -- 0.6* -- 0.9* 1.3*  HGB 7.4* 7.4* 8.1* -- --  HCT 22.6* 22.9* 25.3* -- --  MCV 101.3* 103.2* 105.9* 106.2* 106.2*  PLT 73* 72* 72* -- --   Blood Culture    Component Value Date/Time   SDES CATH TIP 08/21/2012 0936   SPECREQUEST NONE 08/21/2012 0936   CULT NO GROWTH 1 DAY 08/21/2012 0936   REPTSTATUS PENDING 08/21/2012 8413  Studies/Results: Dg Chest Port 1 View  08/22/2012  *RADIOLOGY REPORT*  Clinical Data: Coronary artery disease and peripheral vascular disease.  End-stage renal disease.  Status post dialysis catheter placement.  PORTABLE CHEST - 1 VIEW  Comparison: 08/19/2012  Findings: A new left internal jugular  dual lumen center venous catheter is seen with tips in the mid and distal SVC.  No evidence of pneumothorax.  Both lungs are clear.  Heart size is within normal limits.  Prior CABG again noted.  IMPRESSION: New left internal jugular dual lumen center venous catheter in appropriate position.  No evidence of pneumothorax or other acute findings.   Original Report Authenticated By: Danae Orleans, M.D.   Medications:      . allopurinol  100 mg Oral QHS  . alteplase  2 mg Intracatheter Once  . amLODipine  5 mg Oral Daily  . aspirin EC  81 mg Oral QHS  . atorvastatin  10 mg Oral q1800  . calcium carbonate  1 tablet Oral Daily  . cinacalcet  30 mg Oral Q breakfast  . ferric gluconate (FERRLECIT/NULECIT) IV  125 mg Intravenous Once  . gabapentin  100 mg Oral BID  . multivitamin  1 tablet Oral QHS  . pantoprazole  40 mg Oral Q1200  . paricalcitol  8 mcg Intravenous Q M,W,F-HD  . polyethylene glycol  17 g Oral QHS  . sevelamer  3,200 mg Oral TID WC  . sodium chloride  3 mL Intravenous Q12H  . sodium chloride  3 mL Intravenous Q12H  . sodium polystyrene  15 g Oral Once  . vancomycin  750 mg Intravenous Q M,W,F-HD  . DISCONTD: ceFEPime (MAXIPIME) IV  1 g Intravenous Q24H

## 2012-08-23 NOTE — Procedures (Signed)
I was present at this dialysis session. I have reviewed the session itself and made appropriate changes.   Vinson Moselle, MD [employer] 08/23/2012, 10:19 AM

## 2012-08-23 NOTE — Progress Notes (Signed)
Notified Dr. Bretta Bang of pt's mental status changes that have taken place since surgery.  No new orders given at this time.

## 2012-08-23 NOTE — Progress Notes (Addendum)
Patient ID: Andrew [name]  male  ZOX:[id number]    DOB: 01/26/1926    DOA: 08/19/2012  PCP: [name]., MD  Subjective: Seen on HD, tired  Objective: Weight change: -0.075 kg (-2.7 oz)  Intake/Output Summary (Last 24 hours) at 08/23/12 1900 Last data filed at 08/23/12 1800  Gross per 24 hour  Intake    590 ml  Output   1508 ml  Net   -918 ml   Blood pressure 153/83, pulse 90, temperature 99.2 F (37.3 C), temperature source Oral, resp. rate 18, height 5\' 8"  (1.727 m), weight 76.8 kg (169 lb 5 oz), SpO2 100.00%.  Physical Exam: General:  awake,  not in any acute distress. HEENT: anicteric sclera, pupils reactive to light and accommodation, EOMI CVS: S1-S2 clear, no murmur rubs or gallops Chest: clear to auscultation bilaterally, no wheezing, rales or rhonchi Abdomen: soft nontender, nondistended, normal bowel sounds, no organomegaly Extremities: no cyanosis, clubbing or edema noted bilaterally   Lab Results: Basic Metabolic Panel:  Lab 08/23/12 8119 08/22/12 0635  NA 135 135  K 5.4* 5.4*  CL 96 97  CO2 24 25  GLUCOSE 78 86  BUN 73* 54*  CREATININE 10.34* 7.66*  CALCIUM 8.7 8.6  MG -- --  PHOS 5.3* --   Liver Function Tests:  Lab 08/23/12 0744 08/21/12 0935 08/20/12 0310 08/19/12 2158  AST -- -- 75* 46*  ALT -- -- 34 32  ALKPHOS -- -- 165* 173*  BILITOT -- -- 0.4 0.4  PROT -- -- 6.8 6.6  ALBUMIN 2.8* 3.0* -- --   No results found for this basename: LIPASE:2,AMYLASE:2 in the last 168 hours No results found for this basename: AMMONIA:2 in the last 168 hours CBC:  Lab 08/23/12 0744 08/22/12 0635  WBC 2.1* 1.7*  NEUTROABS -- 0.6*  HGB 7.4* 7.4*  HCT 22.6* 22.9*  MCV 101.3* 103.2*  PLT 73* 72*     Micro Results: Recent Results (from the past 240 hour(s))  CULTURE, BLOOD (ROUTINE X 2)     Status: Normal (Preliminary result)   Collection Time   08/20/12  1:10 AM      Component Value Range Status Comment   Specimen Description BLOOD LEFT ARM   Final     Special Requests BOTTLES DRAWN AEROBIC AND ANAEROBIC 5CC EA   Final    Culture  Setup Time 08/20/2012 11:26   Final    Culture     Final    Value:        BLOOD CULTURE RECEIVED NO GROWTH TO DATE CULTURE WILL BE HELD FOR 5 DAYS BEFORE ISSUING A FINAL NEGATIVE REPORT   Report Status PENDING   Incomplete   CULTURE, BLOOD (ROUTINE X 2)     Status: Normal (Preliminary result)   Collection Time   08/20/12  1:20 AM      Component Value Range Status Comment   Specimen Description BLOOD LEFT ARM   Final    Special Requests BOTTLES DRAWN AEROBIC AND ANAEROBIC 5CC   Final    Culture  Setup Time 08/20/2012 11:25   Final    Culture     Final    Value:        BLOOD CULTURE RECEIVED NO GROWTH TO DATE CULTURE WILL BE HELD FOR 5 DAYS BEFORE ISSUING A FINAL NEGATIVE REPORT   Report Status PENDING   Incomplete   URINE CULTURE     Status: Normal   Collection Time   08/20/12 11:11 PM  Component Value Range Status Comment   Specimen Description URINE, CLEAN CATCH   Final    Special Requests NONE   Final    Culture  Setup Time 08/21/2012 09:41   Final    Colony Count 9,000 COLONIES/ML   Final    Culture INSIGNIFICANT GROWTH   Final    Report Status 08/22/2012 FINAL   Final   CATH TIP CULTURE     Status: Normal   Collection Time   08/21/12  9:36 AM      Component Value Range Status Comment   Specimen Description CATH TIP   Final    Special Requests NONE   Final    Culture NO GROWTH 2 DAYS   Final    Report Status 08/23/2012 FINAL   Final     Studies/Results: Dg Chest 2 View  08/19/2012  *RADIOLOGY REPORT*  Clinical Data: 78 year old male fever.  History of CABG, right nephrectomy.  CHEST - 2 VIEW  Comparison: 06/04/2012.  Findings: Tunneled left chest dual lumen dialysis type catheter in place.  Sequelae of CABG.  Epigastric surgical clips.  Left axillary surgical clips.  Metal vascular stent is stable in the distal right subclavian/axillary region .  No pneumothorax, pulmonary edema, pleural  effusion or confluent pulmonary opacity. No acute osseous abnormality identified.   Stable cardiac size and mediastinal contours.  IMPRESSION: No acute cardiopulmonary abnormality.   Original Report Authenticated By: Harley Hallmark, M.D.    Ct Head Wo Contrast  08/14/2012  *RADIOLOGY REPORT*  Clinical Data:  Larey Seat and hit face.  Head injury.  CT HEAD WITHOUT CONTRAST CT MAXILLOFACIAL WITHOUT CONTRAST  Technique:  Multidetector CT imaging of the head and maxillofacial structures were performed using the standard protocol without intravenous contrast. Multiplanar CT image reconstructions of the maxillofacial structures were also generated.  Comparison:  CT head 03/01/2012  CT HEAD  Findings: Generalized atrophy.  Mild chronic microvascular ischemic changes in the white matter, unchanged.  No acute infarct. Negative for intracranial hemorrhage.  Negative for mass or edema. Calvarium is intact.  IMPRESSION: Atrophy and chronic microvascular ischemia.  No acute abnormality.  CT MAXILLOFACIAL  Findings:   Negative for facial fracture.  No fracture of the orbit or nasal bone is identified.  Zygomatic arch is intact. No blood in the sinuses.  Mild mucosal edema in the paranasal sinuses.  No soft tissue mass or hematoma. Degenerative changes at C1-2 with cystic change in the dens.  Carotid artery calcification bilaterally.  IMPRESSION: Negative for fracture.   Original Report Authenticated By: Camelia Phenes, M.D.    US Carotid Duplex Bilateral  08/06/2012  *RADIOLOGY REPORT*  Clinical Data: Carotid bruit.  Previous stroke.  Hypertension, coronary artery disease, diabetes, tobacco use.  BILATERAL CAROTID DUPLEX ULTRASOUND  Technique: Wallace Cullens scale imaging, color Doppler and duplex ultrasound was performed of bilateral carotid and vertebral arteries in the neck.  Comparison: None available  Criteria:  Quantification of carotid stenosis is based on velocity parameters that correlate the residual internal carotid diameter  with NASCET-based stenosis levels, using the diameter of the distal internal carotid lumen as the denominator for stenosis measurement.  The following velocity measurements were obtained:                   PEAK SYSTOLIC/END DIASTOLIC RIGHT ICA:                        278/66cm/sec CCA:  142/24cm/sec SYSTOLIC ICA/CCA RATIO:     1.95 DIASTOLIC ICA/CCA RATIO:    2.75 ECA:                        182cm/sec  LEFT ICA:                        134/24cm/sec CCA:                        126/28cm/sec SYSTOLIC ICA/CCA RATIO:     1.06 DIASTOLIC ICA/CCA RATIO:    0.86 ECA:                        145cm/sec  Findings:  RIGHT CAROTID ARTERY: Thrombus in the right IJ vein is noted. There is irregular calcified plaque effacing the carotid bulb and extending into proximal internal and external carotid arteries resulting in at least mild stenosis.  There are elevated peak systolic velocities in the proximal ICA.  RIGHT VERTEBRAL ARTERY:  Normal flow direction and waveform.  LEFT CAROTID ARTERY: Eccentric nonocclusive plaque scattered through the common carotid artery.  There is more heavily calcified eccentric circumferential plaque in the carotid bulb extending into proximal internal and external carotid arteries resulting in at least mild stenosis.  Mildly elevated peak systolic velocities recorded.  Normal color Doppler signal.  LEFT VERTEBRAL ARTERY:  Normal flow direction and waveform.  IMPRESSION:  1.  Bilateral carotid bifurcation and proximal ICA plaque, resulting in 50-69% diameter stenosis on the right, less than 50% diameter stenosis on the left. The exam does not exclude plaque ulceration or embolization.  Continued surveillance recommended. 2.  Right IJ thrombus is incidentally noted.   Original Report Authenticated By: Osa Craver, M.D.    US Arterial Seg Single  08/06/2012  *RADIOLOGY REPORT*  Clinical Data: Bilateral foot pain  ULTRASOUND ANKLE/BRACHIAL INDICES BILATERAL  Comparison: None.   Findings: Right and left ankle brachial indices are low at 0.85 and 0.71.  Right posterior tibial wave form is biphasic.  The right dorsalis pedis, left posterior tibial, and left dorsalis pedis are monophasic.  IMPRESSION: Moderate right lower extremity and severe left lower extremity arterial occlusive disease.  CT angiogram is recommended to further characterize.   Original Report Authenticated By: Donavan Burnet, M.D.    Dg Chest Port 1 View  08/22/2012  *RADIOLOGY REPORT*  Clinical Data: Coronary artery disease and peripheral vascular disease.  End-stage renal disease.  Status post dialysis catheter placement.  PORTABLE CHEST - 1 VIEW  Comparison: 08/19/2012  Findings: A new left internal jugular dual lumen center venous catheter is seen with tips in the mid and distal SVC.  No evidence of pneumothorax.  Both lungs are clear.  Heart size is within normal limits.  Prior CABG again noted.  IMPRESSION: New left internal jugular dual lumen center venous catheter in appropriate position.  No evidence of pneumothorax or other acute findings.   Original Report Authenticated By: Danae Orleans, M.D.    Dg Fluoro Guide Cv Line-no Report  08/22/2012  CLINICAL DATA: Insertion Dialysis catheter   FLOURO GUIDE CV LINE  Fluoroscopy was utilized by the requesting physician.  No radiographic  interpretation.     Ct Maxillofacial Wo Cm  08/14/2012  *RADIOLOGY REPORT*  Clinical Data:  Larey Seat and hit face.  Head injury.  CT HEAD WITHOUT CONTRAST CT MAXILLOFACIAL WITHOUT CONTRAST  Technique:  Multidetector CT imaging of the head and maxillofacial structures were performed using the standard protocol without intravenous contrast. Multiplanar CT image reconstructions of the maxillofacial structures were also generated.  Comparison:  CT head 03/01/2012  CT HEAD  Findings: Generalized atrophy.  Mild chronic microvascular ischemic changes in the white matter, unchanged.  No acute infarct. Negative for intracranial hemorrhage.   Negative for mass or edema. Calvarium is intact.  IMPRESSION: Atrophy and chronic microvascular ischemia.  No acute abnormality.  CT MAXILLOFACIAL  Findings:   Negative for facial fracture.  No fracture of the orbit or nasal bone is identified.  Zygomatic arch is intact. No blood in the sinuses.  Mild mucosal edema in the paranasal sinuses.  No soft tissue mass or hematoma. Degenerative changes at C1-2 with cystic change in the dens.  Carotid artery calcification bilaterally.  IMPRESSION: Negative for fracture.   Original Report Authenticated By: Camelia Phenes, M.D.     Medications: Scheduled Meds:   . allopurinol  100 mg Oral QHS  . alteplase  2 mg Intracatheter Once  . alteplase  2 mg Intracatheter Once  . alteplase  2 mg Intracatheter Once  . aspirin EC  81 mg Oral QHS  . atorvastatin  10 mg Oral q1800  . calcium carbonate  1 tablet Oral Daily  . cinacalcet  30 mg Oral Q breakfast  . gabapentin  100 mg Oral BID  . midodrine  10 mg Oral Q M,W,F-HD  . multivitamin  1 tablet Oral QHS  . pantoprazole  40 mg Oral Q1200  . paricalcitol  8 mcg Intravenous Q M,W,F-HD  . polyethylene glycol  17 g Oral QHS  . sevelamer  3,200 mg Oral TID WC  . sodium chloride  3 mL Intravenous Q12H  . sodium chloride  3 mL Intravenous Q12H  . vancomycin  750 mg Intravenous Q M,W,F-HD  . DISCONTD: amLODipine  5 mg Oral Daily  . DISCONTD: ferric gluconate (FERRLECIT/NULECIT) IV  125 mg Intravenous Once   Continuous Infusions:    Assessment/Plan:  1. Micrococcus sepsis/ Fever - unclear source, probably from HD catheter. -  Blood cultures negative so far. Urine cultures grew 9000. CXR negative for pneumonia.new blood cultures pending. - patient empirically on vancomycin and cefepime, as the blood cultures 1/2 from 9/9 from HD positive for Micrococcus Luteus and has remained on vancomycin, to be treated as strep infection per ID recommendations.   2. ESRD on hemodialysis Monday Wednesday and Friday. Renal on  board for dialysis.   3. Chronic pancytopenia -  - continue to monitor counts in patient,per Dr Burna Forts, he will need hematology follow up as outpatient to be arranged at [facility] with Dr. Mariel Sleet. - transfuse PRN, received one unit of packed RBC during hemodialysis  4. CAD status post CABG in 1998 - presently chest pain-free.   5. History of gout - continue present medications.   6. History of renal cell carcinoma status post nephrectomy.   7 Hypertension: patient was actually somewhat hypotensive and tachycardiac in the hemodialysis and Norvasc was discontinued  8. Altered mental status: possibly secondary to #1 and hospital acquired delirium vs anesthesia effect vs CVA vs any septic emboli to brain. Met with daughter, Andrew [name] and she related that this has happenedin the past after a procedure and hospital stay, will continue to monitor, obtain CT head for further w/u. No meningismal signs.   DVT Prophylaxis: SCD  Code Status: full  Disposition:not medically stable   LOS: 4  days   RAI,RIPUDEEP M.D. [facility] 08/23/2012, 7:00 PM Pager: [phone]  If 7PM-7AM, please contact night-coverage [email or web address] Password [email or web address]

## 2012-08-23 NOTE — Progress Notes (Signed)
Pt still confused and only oriented to person only.  Follows all commands.  Per report, this is not acute but began post HD catheter placement on 9/26.  Text paged Dr. Bretta Bang just to make sure MD is aware of confusion post procedure.

## 2012-08-23 NOTE — Progress Notes (Signed)
Events noted in the last few days.  Labs reviewed and discussed with the patient family.  WBC and platelets are up slightly and stable overall. Hgb is stable as well.  No bleeding  Noted.   Assessment:  78 year old with pancytopenia. This is could be due to acute illness, infection and possible antibiotics. This is could also be early MDS as  Well.   Recommendation:  Continue to monitor counts for while in the hospital.  He will need hematology follow up as out patient. This is needs to be arranged at [facility] (with Dr. Mariel Sleet) based on patient and family preference of being close to home.

## 2012-08-23 NOTE — Progress Notes (Signed)
ANTIBIOTIC CONSULT NOTE - FOLLOW UP  Pharmacy Consult for Vancomycin Indication: Micrococcus bacteremia  No Known Allergies  Patient Measurements: Height: 5\' 8"  (172.7 cm) Weight: 173 lb 8 oz (78.7 kg) IBW/kg (Calculated) : 68.4   Vital Signs: Temp: 98.5 F (36.9 C) (09/27 1115) Temp src: Oral (09/27 1115) BP: 122/53 mmHg (09/27 1115) Pulse Rate: 105  (09/27 1115) Intake/Output from previous day: 09/26 0701 - 09/27 0700 In: 350 [P.O.:240; I.V.:110] Out: -  Intake/Output from this shift:    Labs:  Basename 08/23/12 0744 08/22/12 0635 08/21/12 0935  WBC 2.1* 1.7* 1.7*  HGB 7.4* 7.4* 8.1*  PLT 73* 72* 72*  LABCREA -- -- --  CREATININE 10.34* 7.66* 5.35*   Estimated Creatinine Clearance: 5.1 ml/min (by C-G formula based on Cr of 10.34). No results found for this basename: VANCOTROUGH:2,VANCOPEAK:2,VANCORANDOM:2,GENTTROUGH:2,GENTPEAK:2,GENTRANDOM:2,TOBRATROUGH:2,TOBRAPEAK:2,TOBRARND:2,AMIKACINPEAK:2,AMIKACINTROU:2,AMIKACIN:2, in the last 72 hours   Microbiology: Recent Results (from the past 720 hour(s))  CULTURE, BLOOD (ROUTINE X 2)     Status: Normal (Preliminary result)   Collection Time   08/20/12  1:10 AM      Component Value Range Status Comment   Specimen Description BLOOD LEFT ARM   Final    Special Requests BOTTLES DRAWN AEROBIC AND ANAEROBIC 5CC EA   Final    Culture  Setup Time 08/20/2012 11:26   Final    Culture     Final    Value:        BLOOD CULTURE RECEIVED NO GROWTH TO DATE CULTURE WILL BE HELD FOR 5 DAYS BEFORE ISSUING A FINAL NEGATIVE REPORT   Report Status PENDING   Incomplete   CULTURE, BLOOD (ROUTINE X 2)     Status: Normal (Preliminary result)   Collection Time   08/20/12  1:20 AM      Component Value Range Status Comment   Specimen Description BLOOD LEFT ARM   Final    Special Requests BOTTLES DRAWN AEROBIC AND ANAEROBIC 5CC   Final    Culture  Setup Time 08/20/2012 11:25   Final    Culture     Final    Value:        BLOOD CULTURE RECEIVED NO  GROWTH TO DATE CULTURE WILL BE HELD FOR 5 DAYS BEFORE ISSUING A FINAL NEGATIVE REPORT   Report Status PENDING   Incomplete   URINE CULTURE     Status: Normal   Collection Time   08/20/12 11:11 PM      Component Value Range Status Comment   Specimen Description URINE, CLEAN CATCH   Final    Special Requests NONE   Final    Culture  Setup Time 08/21/2012 09:41   Final    Colony Count 9,000 COLONIES/ML   Final    Culture INSIGNIFICANT GROWTH   Final    Report Status 08/22/2012 FINAL   Final   CATH TIP CULTURE     Status: Normal   Collection Time   08/21/12  9:36 AM      Component Value Range Status Comment   Specimen Description CATH TIP   Final    Special Requests NONE   Final    Culture NO GROWTH 2 DAYS   Final    Report Status 08/23/2012 FINAL   Final     Anti-infectives     Start     Dose/Rate Route Frequency Ordered Stop   08/23/12 1200   vancomycin (VANCOCIN) 750 mg in sodium chloride 0.9 % 150 mL IVPB  750 mg 150 mL/hr over 60 Minutes Intravenous Every M-W-F (Hemodialysis) 08/22/12 1308     08/20/12 2200   vancomycin (VANCOCIN) 750 mg in sodium chloride 0.9 % 150 mL IVPB        750 mg 150 mL/hr over 60 Minutes Intravenous  Once 08/20/12 1522 08/20/12 2227   08/20/12 2200   ceFEPIme (MAXIPIME) 1 g in dextrose 5 % 50 mL IVPB  Status:  Discontinued        1 g 100 mL/hr over 30 Minutes Intravenous Every 24 hours 08/20/12 1522 08/22/12 1240   08/20/12 0500   vancomycin (VANCOCIN) 750 mg in sodium chloride 0.9 % 150 mL IVPB        750 mg 150 mL/hr over 60 Minutes Intravenous  Once 08/20/12 0424 08/20/12 0635   08/20/12 0300   ceFEPIme (MAXIPIME) 2 g in dextrose 5 % 50 mL IVPB        2 g 100 mL/hr over 30 Minutes Intravenous  Once 08/20/12 0245 08/20/12 0422          Assessment: Micrococcus bacteremia, fever:  Patient has been on Vancomycin with each HD session since 9/9.  He has a new HD catheter and his HD sessions are back on schedule.  Goal of Therapy:  preHD  Vancomycin level 15-25 mcg/ml  Plan:  Continue Vancomycin 750mg  IV with each HD session Monitor for HD schedule and tolerance Consider checking preHD Vancomycin level next week.  Estella Husk, Pharm.D., BCPS Clinical Pharmacist  Phone [phone] Pager [phone] 08/23/2012, 11:29 AM

## 2012-08-24 DIAGNOSIS — E785 Hyperlipidemia, unspecified: Secondary | ICD-10-CM

## 2012-08-24 LAB — CBC
HCT: 28.1 % — ABNORMAL LOW (ref 39.0–52.0)
MCHC: 32.7 g/dL (ref 30.0–36.0)
Platelets: 76 10*3/uL — ABNORMAL LOW (ref 150–400)
RDW: 19.1 % — ABNORMAL HIGH (ref 11.5–15.5)

## 2012-08-24 LAB — TYPE AND SCREEN
ABO/RH(D): O POS
Antibody Screen: NEGATIVE
Unit division: 0

## 2012-08-24 LAB — FOLATE: Folate: >20 ng/mL

## 2012-08-24 LAB — BASIC METABOLIC PANEL
BUN: 50 mg/dL — ABNORMAL HIGH (ref 6–23)
Creatinine, Ser: 7.38 mg/dL — ABNORMAL HIGH (ref 0.50–1.35)
GFR calc Af Amer: 7 mL/min — ABNORMAL LOW (ref >90)
GFR calc non Af Amer: 6 mL/min — ABNORMAL LOW (ref >90)
Potassium: 4.8 mEq/L (ref 3.5–5.1)

## 2012-08-24 LAB — SEDIMENTATION RATE: Sed Rate: 60 mm/hr — ABNORMAL HIGH (ref 0–16)

## 2012-08-24 NOTE — Progress Notes (Addendum)
Subjective:  No current complaints, but pt's daughter reports confusion, including inability to recall names or events; states he is slightly better today; able to ambulate today w assistance. Family says he is able to name names of family members today.   Objective: Vital signs in last 24 hours: Temp:  [97.3 F (36.3 C)-99.2 F (37.3 C)] 97.9 F (36.6 C) (09/28 0500) Pulse Rate:  [75-100] 88  (09/28 0500) Resp:  [16-20] 19  (09/28 0500) BP: (123-156)/(64-83) 141/76 mmHg (09/28 0500) SpO2:  [98 %-100 %] 98 % (09/28 0500) Weight:  [75.1 kg (165 lb 9.1 oz)-76.8 kg (169 lb 5 oz)] 75.1 kg (165 lb 9.1 oz) (09/28 0500) Weight change: 1.8 kg (3 lb 15.5 oz)  Intake/Output from previous day: 09/27 0701 - 09/28 0700 In: 590 [P.O.:240; Blood:350] Out: 1508 [Stool:1]   EXAM: General appearance:  Alert, in no apparent distress Resp: CTA without rales, rhonchi, or wheezes Cardio: RRR with Gr II/VI systolic murmur GI: + BS, soft and nontender Extremities: No edema Access:  Left IJ catheter  Lab Results:  Basename 08/23/12 0744 08/22/12 0635  WBC 2.1* 1.7*  HGB 7.4* 7.4*  HCT 22.6* 22.9*  PLT 73* 72*   BMET:  Basename 08/23/12 0744 08/22/12 0635  NA 135 135  K 5.4* 5.4*  CL 96 97  CO2 24 25  GLUCOSE 78 86  BUN 73* 54*  CREATININE 10.34* 7.66*  CALCIUM 8.7 8.6  ALBUMIN 2.8* --   No results found for this basename: PTH:2 in the last 72 hours Iron Studies:  Basename 08/23/12 1230  IRON 144*  TIBC 258  TRANSFERRIN --  FERRITIN --   Dialysis Orders: Center: [place] on MWF.  EDW 76 kg HD Bath 2k/2.25Ca Time 4 hrs Heparin 3000. Access Left IJ catheter BFR 400 DFR 800  Zemplar 8 mcg IV/HD Epogen 11,000 Units IV/HD Venofer 50 mg on Wed.   Assessment/Plan: 1. Micrococcus luteus sepsis - WBCs low 1.7 up to 2.1, chest x-ray negative, UA negative, 1 of 2 outpatient blood cultures on 9/9 ; new blood cultures with no growth; left IJ catheter removed 9/25 and replaced 9/26 with tip  culture no growth so far, but was on antibioitics; old R thigh AVG (placed 02/29/12) and left and right old arm accesses without evidence of infection. (Per Dr. Arlean Hopping ID said to treat micrococus like a strep infection),. Pharmacy is dosing Vanc, complete total 3 week course at dialysis. 2. ESRD - HD on MWF in [place]; K 5.4 pre-HD yesterday. 3. Hypertension/volume - BP 137/64, receives Midodrine 10 mg prior to HD as outpatient; EDW 76 kg, clear cxr post cathteter placement and added pre HD midodrine. 4. AMS - since yesterday, prob TME. Apparently family said this has happened before in hospital. Not getting meds which would cause AMS.  DC Neurontin. 5. Anemia - Hgb 7.4 x 2 days, received 1 unit on HD yesterday; no new labs; on outpatient Epogen 11,000 U and Venofer qwk, s/p Aranesp 200 mg and IV Fe. Labs ordered for today to f/u Hb and K+.  6. Metabolic bone disease - Ca 8.7 (9.7 corrected), P 5.3, Zemplar 8 mcg, Sensipar 30 mg qd, and Renvela 3 with meals, 2 with snacks. 7. Nutrition - Alb 2.8, high protein renal diet. 8. 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 9. Hx CAD - s/p CABG x4 in '98. 10. Thrombocytopenia - platelets stable. 11. Code status- patient w declining health and recurrent access failure over the last  year or two.  Discussed overall plan with daughters (x2). They said he does not want heroic measures and are going to talk with other family about possible DNR status. Appears to be declining overall. We talked about the possibilities if he continues to decline, including possible withdrawal of dialysis, and questions were answered. Discussed with Dr. Isidoro Donning also.  LOS: 5 days   [name],Andrew [name] 08/24/2012,12:48 PM

## 2012-08-24 NOTE — Progress Notes (Signed)
Patient ID: Andrew [name]  male  ZOX:[id number]    DOB: 1926/01/16    DOA: 08/19/2012  PCP: [name]., MD  Subjective: Patient alert and awake however continues to repeat the sentences. Denies any pain.  Review of systems: unable to obtain from the patient due to his mental status but no reported acute events overnight  Objective: Weight change: 1.8 kg (3 lb 15.5 oz)  Intake/Output Summary (Last 24 hours) at 08/24/12 1456 Last data filed at 08/24/12 1300  Gross per 24 hour  Intake    480 ml  Output    126 ml  Net    354 ml   Blood pressure 162/77, pulse 81, temperature 97.7 F (36.5 C), temperature source Oral, resp. rate 18, height 5\' 8"  (1.727 m), weight 75.1 kg (165 lb 9.1 oz), SpO2 98.00%.  Physical Exam: General:  awake,  not in any acute distress. HEENT: anicteric sclera, pupils reactive to light and accommodation, EOMI CVS: S1-S2 clear, no murmur rubs or gallops Chest: clear to auscultation bilaterally, no wheezing, rales or rhonchi Abdomen: soft nontender, nondistended, normal bowel sounds, no organomegaly Extremities: no cyanosis, clubbing or edema noted bilaterally   Lab Results: Basic Metabolic Panel:  Lab 08/23/12 8119 08/22/12 0635  NA 135 135  K 5.4* 5.4*  CL 96 97  CO2 24 25  GLUCOSE 78 86  BUN 73* 54*  CREATININE 10.34* 7.66*  CALCIUM 8.7 8.6  MG -- --  PHOS 5.3* --   Liver Function Tests:  Lab 08/23/12 0744 08/21/12 0935 08/20/12 0310 08/19/12 2158  AST -- -- 75* 46*  ALT -- -- 34 32  ALKPHOS -- -- 165* 173*  BILITOT -- -- 0.4 0.4  PROT -- -- 6.8 6.6  ALBUMIN 2.8* 3.0* -- --   No results found for this basename: LIPASE:2,AMYLASE:2 in the last 168 hours No results found for this basename: AMMONIA:2 in the last 168 hours CBC:  Lab 08/23/12 0744 08/22/12 0635  WBC 2.1* 1.7*  NEUTROABS -- 0.6*  HGB 7.4* 7.4*  HCT 22.6* 22.9*  MCV 101.3* 103.2*  PLT 73* 72*     Micro Results: Recent Results (from the past 240 hour(s))  CULTURE,  BLOOD (ROUTINE X 2)     Status: Normal (Preliminary result)   Collection Time   08/20/12  1:10 AM      Component Value Range Status Comment   Specimen Description BLOOD LEFT ARM   Final    Special Requests BOTTLES DRAWN AEROBIC AND ANAEROBIC 5CC EA   Final    Culture  Setup Time 08/20/2012 11:26   Final    Culture     Final    Value:        BLOOD CULTURE RECEIVED NO GROWTH TO DATE CULTURE WILL BE HELD FOR 5 DAYS BEFORE ISSUING A FINAL NEGATIVE REPORT   Report Status PENDING   Incomplete   CULTURE, BLOOD (ROUTINE X 2)     Status: Normal (Preliminary result)   Collection Time   08/20/12  1:20 AM      Component Value Range Status Comment   Specimen Description BLOOD LEFT ARM   Final    Special Requests BOTTLES DRAWN AEROBIC AND ANAEROBIC 5CC   Final    Culture  Setup Time 08/20/2012 11:25   Final    Culture     Final    Value:        BLOOD CULTURE RECEIVED NO GROWTH TO DATE CULTURE WILL BE HELD FOR 5 DAYS BEFORE  ISSUING A FINAL NEGATIVE REPORT   Report Status PENDING   Incomplete   URINE CULTURE     Status: Normal   Collection Time   08/20/12 11:11 PM      Component Value Range Status Comment   Specimen Description URINE, CLEAN CATCH   Final    Special Requests NONE   Final    Culture  Setup Time 08/21/2012 09:41   Final    Colony Count 9,000 COLONIES/ML   Final    Culture INSIGNIFICANT GROWTH   Final    Report Status 08/22/2012 FINAL   Final   CATH TIP CULTURE     Status: Normal   Collection Time   08/21/12  9:36 AM      Component Value Range Status Comment   Specimen Description CATH TIP   Final    Special Requests NONE   Final    Culture NO GROWTH 2 DAYS   Final    Report Status 08/23/2012 FINAL   Final     Studies/Results: Dg Chest 2 View  08/19/2012  *RADIOLOGY REPORT*  Clinical Data: 78 year old male fever.  History of CABG, right nephrectomy.  CHEST - 2 VIEW  Comparison: 06/04/2012.  Findings: Tunneled left chest dual lumen dialysis type catheter in place.  Sequelae of CABG.   Epigastric surgical clips.  Left axillary surgical clips.  Metal vascular stent is stable in the distal right subclavian/axillary region .  No pneumothorax, pulmonary edema, pleural effusion or confluent pulmonary opacity. No acute osseous abnormality identified.   Stable cardiac size and mediastinal contours.  IMPRESSION: No acute cardiopulmonary abnormality.   Original Report Authenticated By: Harley Hallmark, M.D.    Ct Head Wo Contrast  08/14/2012  *RADIOLOGY REPORT*  Clinical Data:  Larey Seat and hit face.  Head injury.  CT HEAD WITHOUT CONTRAST CT MAXILLOFACIAL WITHOUT CONTRAST  Technique:  Multidetector CT imaging of the head and maxillofacial structures were performed using the standard protocol without intravenous contrast. Multiplanar CT image reconstructions of the maxillofacial structures were also generated.  Comparison:  CT head 03/01/2012  CT HEAD  Findings: Generalized atrophy.  Mild chronic microvascular ischemic changes in the white matter, unchanged.  No acute infarct. Negative for intracranial hemorrhage.  Negative for mass or edema. Calvarium is intact.  IMPRESSION: Atrophy and chronic microvascular ischemia.  No acute abnormality.  CT MAXILLOFACIAL  Findings:   Negative for facial fracture.  No fracture of the orbit or nasal bone is identified.  Zygomatic arch is intact. No blood in the sinuses.  Mild mucosal edema in the paranasal sinuses.  No soft tissue mass or hematoma. Degenerative changes at C1-2 with cystic change in the dens.  Carotid artery calcification bilaterally.  IMPRESSION: Negative for fracture.   Original Report Authenticated By: Camelia Phenes, M.D.    US Carotid Duplex Bilateral  08/06/2012  *RADIOLOGY REPORT*  Clinical Data: Carotid bruit.  Previous stroke.  Hypertension, coronary artery disease, diabetes, tobacco use.  BILATERAL CAROTID DUPLEX ULTRASOUND  Technique: Wallace Cullens scale imaging, color Doppler and duplex ultrasound was performed of bilateral carotid and vertebral  arteries in the neck.  Comparison: None available  Criteria:  Quantification of carotid stenosis is based on velocity parameters that correlate the residual internal carotid diameter with NASCET-based stenosis levels, using the diameter of the distal internal carotid lumen as the denominator for stenosis measurement.  The following velocity measurements were obtained:  PEAK SYSTOLIC/END DIASTOLIC RIGHT ICA:                        278/66cm/sec CCA:                        142/24cm/sec SYSTOLIC ICA/CCA RATIO:     1.95 DIASTOLIC ICA/CCA RATIO:    2.75 ECA:                        182cm/sec  LEFT ICA:                        134/24cm/sec CCA:                        126/28cm/sec SYSTOLIC ICA/CCA RATIO:     1.06 DIASTOLIC ICA/CCA RATIO:    0.86 ECA:                        145cm/sec  Findings:  RIGHT CAROTID ARTERY: Thrombus in the right IJ vein is noted. There is irregular calcified plaque effacing the carotid bulb and extending into proximal internal and external carotid arteries resulting in at least mild stenosis.  There are elevated peak systolic velocities in the proximal ICA.  RIGHT VERTEBRAL ARTERY:  Normal flow direction and waveform.  LEFT CAROTID ARTERY: Eccentric nonocclusive plaque scattered through the common carotid artery.  There is more heavily calcified eccentric circumferential plaque in the carotid bulb extending into proximal internal and external carotid arteries resulting in at least mild stenosis.  Mildly elevated peak systolic velocities recorded.  Normal color Doppler signal.  LEFT VERTEBRAL ARTERY:  Normal flow direction and waveform.  IMPRESSION:  1.  Bilateral carotid bifurcation and proximal ICA plaque, resulting in 50-69% diameter stenosis on the right, less than 50% diameter stenosis on the left. The exam does not exclude plaque ulceration or embolization.  Continued surveillance recommended. 2.  Right IJ thrombus is incidentally noted.   Original Report Authenticated By: Osa Craver, M.D.    US Arterial Seg Single  08/06/2012  *RADIOLOGY REPORT*  Clinical Data: Bilateral foot pain  ULTRASOUND ANKLE/BRACHIAL INDICES BILATERAL  Comparison: None.  Findings: Right and left ankle brachial indices are low at 0.85 and 0.71.  Right posterior tibial wave form is biphasic.  The right dorsalis pedis, left posterior tibial, and left dorsalis pedis are monophasic.  IMPRESSION: Moderate right lower extremity and severe left lower extremity arterial occlusive disease.  CT angiogram is recommended to further characterize.   Original Report Authenticated By: Donavan Burnet, M.D.    Dg Chest Port 1 View  08/22/2012  *RADIOLOGY REPORT*  Clinical Data: Coronary artery disease and peripheral vascular disease.  End-stage renal disease.  Status post dialysis catheter placement.  PORTABLE CHEST - 1 VIEW  Comparison: 08/19/2012  Findings: A new left internal jugular dual lumen center venous catheter is seen with tips in the mid and distal SVC.  No evidence of pneumothorax.  Both lungs are clear.  Heart size is within normal limits.  Prior CABG again noted.  IMPRESSION: New left internal jugular dual lumen center venous catheter in appropriate position.  No evidence of pneumothorax or other acute findings.   Original Report Authenticated By: Danae Orleans, M.D.    Dg Fluoro Guide Cv Line-no Report  08/22/2012  CLINICAL DATA: Insertion Dialysis catheter  FLOURO GUIDE CV LINE  Fluoroscopy was utilized by the requesting physician.  No radiographic  interpretation.     Ct Maxillofacial Wo Cm  08/14/2012  *RADIOLOGY REPORT*  Clinical Data:  Larey Seat and hit face.  Head injury.  CT HEAD WITHOUT CONTRAST CT MAXILLOFACIAL WITHOUT CONTRAST  Technique:  Multidetector CT imaging of the head and maxillofacial structures were performed using the standard protocol without intravenous contrast. Multiplanar CT image reconstructions of the maxillofacial structures were also generated.  Comparison:  CT head  03/01/2012  CT HEAD  Findings: Generalized atrophy.  Mild chronic microvascular ischemic changes in the white matter, unchanged.  No acute infarct. Negative for intracranial hemorrhage.  Negative for mass or edema. Calvarium is intact.  IMPRESSION: Atrophy and chronic microvascular ischemia.  No acute abnormality.  CT MAXILLOFACIAL  Findings:   Negative for facial fracture.  No fracture of the orbit or nasal bone is identified.  Zygomatic arch is intact. No blood in the sinuses.  Mild mucosal edema in the paranasal sinuses.  No soft tissue mass or hematoma. Degenerative changes at C1-2 with cystic change in the dens.  Carotid artery calcification bilaterally.  IMPRESSION: Negative for fracture.   Original Report Authenticated By: Camelia Phenes, M.D.     Medications: Scheduled Meds:    . allopurinol  100 mg Oral QHS  . aspirin EC  81 mg Oral QHS  . atorvastatin  10 mg Oral q1800  . calcium carbonate  1 tablet Oral Daily  . cinacalcet  30 mg Oral Q breakfast  . midodrine  10 mg Oral Q M,W,F-HD  . multivitamin  1 tablet Oral QHS  . pantoprazole  40 mg Oral Q1200  . paricalcitol  8 mcg Intravenous Q M,W,F-HD  . polyethylene glycol  17 g Oral QHS  . sevelamer  3,200 mg Oral TID WC  . sodium chloride  3 mL Intravenous Q12H  . sodium chloride  3 mL Intravenous Q12H  . vancomycin  750 mg Intravenous Q M,W,F-HD  . DISCONTD: gabapentin  100 mg Oral BID   Continuous Infusions:    Assessment/Plan:   Micrococcus sepsis/ Fever - unclear source, probably from HD catheter. -  Blood cultures negative so far. Urine cultures grew 9000. CXR negative for pneumonia. new blood cultures pending but negative so far. - patient empirically on vancomycin and cefepime, as the blood cultures 1/2 from 9/9 from HD positive for Micrococcus Luteus and has remained on vancomycin, to be treated as strep infection per ID recommendations.    Altered mental status: possibly secondary to #1 and hospital acquired delirium  vs anesthesia effect vs CVA vs any septic emboli to brain.  - CT head negative for any acute stroke, no meningismus signs - ordered MRI of the brain as patient could have septic emboli to the brain versus subtle stroke missed by CT head. I also ordered dementia workup.   ESRD on hemodialysis Monday Wednesday and Friday. Renal on board for dialysis. Continued dialysis access issues which in future will require long-term decision about goals especially with his failing health    Chronic pancytopenia -  - continue to monitor counts in patient,per Dr Burna Forts, he will need hematology follow up as outpatient to be arranged at [facility] with Dr. Mariel Sleet. - transfuse PRN, received one unit of packed RBC during hemodialysis on 08/23/12   CAD status post CABG in 1998 - presently chest pain-free.    History of gout - continue present medications.   History of renal  cell carcinoma status post nephrectomy.    Hypertension: patient was actually somewhat hypotensive and tachycardiac in the hemodialysis yesterday and Norvasc was discontinued but today BP is somewhat elevated.  DVT Prophylaxis: SCD  Code Status:  Code status readdressed by renal, Dr. Arlean Hopping, now after discussion with the family, he is DO NOT RESUSCITATE status  Disposition:not medically stable   LOS: 5 days   Lurena Naeve M.D. [facility] 08/24/2012, 2:56 PM Pager: [phone]  If 7PM-7AM, please contact night-coverage [email or web address] Password [email or web address]

## 2012-08-25 MED ORDER — HEPARIN SODIUM (PORCINE) 1000 UNIT/ML DIALYSIS
20.0000 [IU]/kg | INTRAMUSCULAR | Status: DC | PRN
  Filled 2012-08-25: qty 2

## 2012-08-25 MED ORDER — DARBEPOETIN ALFA-POLYSORBATE 200 MCG/0.4ML IJ SOLN
200.0000 ug | INTRAMUSCULAR | Status: DC
  Administered 2012-08-26: 200 ug via INTRAVENOUS
  Filled 2012-08-25: qty 0.4

## 2012-08-25 NOTE — Evaluation (Signed)
Physical Therapy Evaluation Patient Details Name: Andrew [name] MRN: [id number] DOB: 06/29/1926 Today's Date: 08/25/2012 Time: 1308-6578 PT Time Calculation (min): 22 min  PT Assessment / Plan / Recommendation Clinical Impression  Pt demo deconditioning following hospitalization.  PT indicated to maximize functional mobility for safe d/c home alone.    PT Assessment  Patient needs continued PT services    Follow Up Recommendations  Home health PT    Barriers to Discharge None      Equipment Recommendations  None recommended by PT    Recommendations for Other Services     Frequency Min 3X/week    Precautions / Restrictions Precautions Precautions: None   Pertinent Vitals/Pain 0/10      Mobility  Bed Mobility Bed Mobility: Supine to Sit;Sit to Supine Supine to Sit: 6: Modified independent (Device/Increase time) Sit to Supine: 6: Modified independent (Device/Increase time) Transfers Transfers: Sit to Stand;Stand to Sit Sit to Stand: 5: Supervision;From bed;With upper extremity assist Stand to Sit: 5: Supervision;To bed;With upper extremity assist Details for Transfer Assistance: verbal cues for hand placement Ambulation/Gait Ambulation/Gait Assistance: 4: Min guard Ambulation Distance (Feet): 350 Feet Assistive device: Straight cane Gait Pattern: Step-through pattern Gait velocity: WFL General Gait Details: unsteady, deviated path    Shoulder Instructions     Exercises     PT Diagnosis: Difficulty walking;Generalized weakness  PT Problem List: Decreased activity tolerance;Decreased mobility;Decreased safety awareness PT Treatment Interventions: DME instruction;Gait training;Stair training;Functional mobility training;Therapeutic activities;Therapeutic exercise;Patient/family education;Balance training   PT Goals Acute Rehab PT Goals PT Goal Formulation: With patient Time For Goal Achievement: 09/01/12 Potential to Achieve Goals: Good Pt will go Sit to  Stand: with modified independence;with upper extremity assist PT Goal: Sit to Stand - Progress: Goal set today Pt will go Stand to Sit: with modified independence;with upper extremity assist PT Goal: Stand to Sit - Progress: Goal set today Pt will Transfer Bed to Chair/Chair to Bed: with modified independence PT Transfer Goal: Bed to Chair/Chair to Bed - Progress: Goal set today Pt will Ambulate: >150 feet;with modified independence;with cane PT Goal: Ambulate - Progress: Goal set today Pt will Go Up / Down Stairs: 1-2 stairs;with cane;with supervision PT Goal: Up/Down Stairs - Progress: Goal set today  Visit Information  Last PT Received On: 08/25/12 Assistance Needed: +1    Subjective Data  Subjective: "I'm good." Patient Stated Goal: home   Prior Functioning  Home Living Lives With: Alone Available Help at Discharge: Family;Available PRN/intermittently Type of Home: House Home Access: Stairs to enter [employer] of Steps: 1 Entrance Stairs-Rails: None Home Layout: One level;Laundry or work area in [employer] Equipment: Straight cane Prior Function Level of Independence: Independent Able to Take Stairs?: Yes Driving: Yes Vocation: Retired Musician: No difficulties    Cognition  Overall Cognitive Status: Appears within functional limits for tasks assessed/performed Arousal/Alertness: Awake/alert Orientation Level: Appears intact for tasks assessed Behavior During Session: [facility] for tasks performed    Extremity/Trunk Assessment     Balance    End of Session PT - End of Session Equipment Utilized During Treatment: Gait belt Activity Tolerance: Patient tolerated treatment well Patient left: in bed;with call bell/phone within reach;with family/visitor present Nurse Communication: Mobility status  GP     Ilda Foil 08/25/2012, 3:09 PM

## 2012-08-25 NOTE — Progress Notes (Signed)
Patient still having frequent PVC's. No acute distress or complaints, will cont. To monitor.

## 2012-08-25 NOTE — Progress Notes (Signed)
Patient ID: Andrew [name]  male  ZOX:[id number]    DOB: 09/20/1926    DOA: 08/19/2012  PCP: [name]., MD  Subjective: Patient alert awake, oriented, responding to all the questions appropriately, daughter at the bedside confirms that this is his baseline mental status. No acute events overnight, no nausea, fever, chills, vomiting.  Objective: Weight change: 0.362 kg (12.8 oz)  Intake/Output Summary (Last 24 hours) at 08/25/12 1352 Last data filed at 08/25/12 0900  Gross per 24 hour  Intake    480 ml  Output      0 ml  Net    480 ml   Blood pressure 137/81, pulse 70, temperature 98.7 F (37.1 C), temperature source Oral, resp. rate 18, height 5\' 8"  (1.727 m), weight 79.062 kg (174 lb 4.8 oz), SpO2 96.00%.  Physical Exam: General:  Awake,oriented x3  not in any acute distress. HEENT: anicteric sclera, pupils reactive to light and accommodation, EOMI CVS: S1-S2 clear, no murmur rubs or gallops Chest: clear to auscultation bilaterally, no wheezing, rales or rhonchi Abdomen: soft nontender, nondistended, normal bowel sounds, no organomegaly Extremities: no cyanosis, clubbing or edema noted bilaterally   Lab Results: Basic Metabolic Panel:  Lab 08/24/12 8119 08/23/12 0744  NA 136 135  K 4.8 5.4*  CL 96 96  CO2 27 24  GLUCOSE 106* 78  BUN 50* 73*  CREATININE 7.38* 10.34*  CALCIUM 8.5 8.7  MG -- --  PHOS -- 5.3*   Liver Function Tests:  Lab 08/23/12 0744 08/21/12 0935 08/20/12 0310 08/19/12 2158  AST -- -- 75* 46*  ALT -- -- 34 32  ALKPHOS -- -- 165* 173*  BILITOT -- -- 0.4 0.4  PROT -- -- 6.8 6.6  ALBUMIN 2.8* 3.0* -- --   CBC:  Lab 08/24/12 1410 08/23/12 0744 08/22/12 0635  WBC 2.0* 2.1* --  NEUTROABS -- -- 0.6*  HGB 9.2* 7.4* --  HCT 28.1* 22.6* --  MCV 98.3 101.3* --  PLT 76* 73* --     Micro Results: Recent Results (from the past 240 hour(s))  CULTURE, BLOOD (ROUTINE X 2)     Status: Normal (Preliminary result)   Collection Time   08/20/12   1:10 AM      Component Value Range Status Comment   Specimen Description BLOOD LEFT ARM   Final    Special Requests BOTTLES DRAWN AEROBIC AND ANAEROBIC 5CC EA   Final    Culture  Setup Time 08/20/2012 11:26   Final    Culture     Final    Value:        BLOOD CULTURE RECEIVED NO GROWTH TO DATE CULTURE WILL BE HELD FOR 5 DAYS BEFORE ISSUING A FINAL NEGATIVE REPORT   Report Status PENDING   Incomplete   CULTURE, BLOOD (ROUTINE X 2)     Status: Normal (Preliminary result)   Collection Time   08/20/12  1:20 AM      Component Value Range Status Comment   Specimen Description BLOOD LEFT ARM   Final    Special Requests BOTTLES DRAWN AEROBIC AND ANAEROBIC 5CC   Final    Culture  Setup Time 08/20/2012 11:25   Final    Culture     Final    Value:        BLOOD CULTURE RECEIVED NO GROWTH TO DATE CULTURE WILL BE HELD FOR 5 DAYS BEFORE ISSUING A FINAL NEGATIVE REPORT   Report Status PENDING   Incomplete   URINE CULTURE  Status: Normal   Collection Time   08/20/12 11:11 PM      Component Value Range Status Comment   Specimen Description URINE, CLEAN CATCH   Final    Special Requests NONE   Final    Culture  Setup Time 08/21/2012 09:41   Final    Colony Count 9,000 COLONIES/ML   Final    Culture INSIGNIFICANT GROWTH   Final    Report Status 08/22/2012 FINAL   Final   CATH TIP CULTURE     Status: Normal   Collection Time   08/21/12  9:36 AM      Component Value Range Status Comment   Specimen Description CATH TIP   Final    Special Requests NONE   Final    Culture NO GROWTH 2 DAYS   Final    Report Status 08/23/2012 FINAL   Final     Studies/Results: Dg Chest 2 View  08/19/2012  *RADIOLOGY REPORT*  Clinical Data: 78 year old male fever.  History of CABG, right nephrectomy.  CHEST - 2 VIEW  Comparison: 06/04/2012.  Findings: Tunneled left chest dual lumen dialysis type catheter in place.  Sequelae of CABG.  Epigastric surgical clips.  Left axillary surgical clips.  Metal vascular stent is stable  in the distal right subclavian/axillary region .  No pneumothorax, pulmonary edema, pleural effusion or confluent pulmonary opacity. No acute osseous abnormality identified.   Stable cardiac size and mediastinal contours.  IMPRESSION: No acute cardiopulmonary abnormality.   Original Report Authenticated By: Harley Hallmark, M.D.    Ct Head Wo Contrast  08/14/2012  *RADIOLOGY REPORT*  Clinical Data:  Larey Seat and hit face.  Head injury.  CT HEAD WITHOUT CONTRAST CT MAXILLOFACIAL WITHOUT CONTRAST  Technique:  Multidetector CT imaging of the head and maxillofacial structures were performed using the standard protocol without intravenous contrast. Multiplanar CT image reconstructions of the maxillofacial structures were also generated.  Comparison:  CT head 03/01/2012  CT HEAD  Findings: Generalized atrophy.  Mild chronic microvascular ischemic changes in the white matter, unchanged.  No acute infarct. Negative for intracranial hemorrhage.  Negative for mass or edema. Calvarium is intact.  IMPRESSION: Atrophy and chronic microvascular ischemia.  No acute abnormality.  CT MAXILLOFACIAL  Findings:   Negative for facial fracture.  No fracture of the orbit or nasal bone is identified.  Zygomatic arch is intact. No blood in the sinuses.  Mild mucosal edema in the paranasal sinuses.  No soft tissue mass or hematoma. Degenerative changes at C1-2 with cystic change in the dens.  Carotid artery calcification bilaterally.  IMPRESSION: Negative for fracture.   Original Report Authenticated By: Camelia Phenes, M.D.    US Carotid Duplex Bilateral  08/06/2012  *RADIOLOGY REPORT*  Clinical Data: Carotid bruit.  Previous stroke.  Hypertension, coronary artery disease, diabetes, tobacco use.  BILATERAL CAROTID DUPLEX ULTRASOUND  Technique: Wallace Cullens scale imaging, color Doppler and duplex ultrasound was performed of bilateral carotid and vertebral arteries in the neck.  Comparison: None available  Criteria:  Quantification of carotid  stenosis is based on velocity parameters that correlate the residual internal carotid diameter with NASCET-based stenosis levels, using the diameter of the distal internal carotid lumen as the denominator for stenosis measurement.  The following velocity measurements were obtained:                   PEAK SYSTOLIC/END DIASTOLIC RIGHT ICA:  278/66cm/sec CCA:                        142/24cm/sec SYSTOLIC ICA/CCA RATIO:     1.95 DIASTOLIC ICA/CCA RATIO:    2.75 ECA:                        182cm/sec  LEFT ICA:                        134/24cm/sec CCA:                        126/28cm/sec SYSTOLIC ICA/CCA RATIO:     1.06 DIASTOLIC ICA/CCA RATIO:    0.86 ECA:                        145cm/sec  Findings:  RIGHT CAROTID ARTERY: Thrombus in the right IJ vein is noted. There is irregular calcified plaque effacing the carotid bulb and extending into proximal internal and external carotid arteries resulting in at least mild stenosis.  There are elevated peak systolic velocities in the proximal ICA.  RIGHT VERTEBRAL ARTERY:  Normal flow direction and waveform.  LEFT CAROTID ARTERY: Eccentric nonocclusive plaque scattered through the common carotid artery.  There is more heavily calcified eccentric circumferential plaque in the carotid bulb extending into proximal internal and external carotid arteries resulting in at least mild stenosis.  Mildly elevated peak systolic velocities recorded.  Normal color Doppler signal.  LEFT VERTEBRAL ARTERY:  Normal flow direction and waveform.  IMPRESSION:  1.  Bilateral carotid bifurcation and proximal ICA plaque, resulting in 50-69% diameter stenosis on the right, less than 50% diameter stenosis on the left. The exam does not exclude plaque ulceration or embolization.  Continued surveillance recommended. 2.  Right IJ thrombus is incidentally noted.   Original Report Authenticated By: Osa Craver, M.D.    US Arterial Seg Single  08/06/2012  *RADIOLOGY REPORT*   Clinical Data: Bilateral foot pain  ULTRASOUND ANKLE/BRACHIAL INDICES BILATERAL  Comparison: None.  Findings: Right and left ankle brachial indices are low at 0.85 and 0.71.  Right posterior tibial wave form is biphasic.  The right dorsalis pedis, left posterior tibial, and left dorsalis pedis are monophasic.  IMPRESSION: Moderate right lower extremity and severe left lower extremity arterial occlusive disease.  CT angiogram is recommended to further characterize.   Original Report Authenticated By: Donavan Burnet, M.D.    Dg Chest Port 1 View  08/22/2012  *RADIOLOGY REPORT*  Clinical Data: Coronary artery disease and peripheral vascular disease.  End-stage renal disease.  Status post dialysis catheter placement.  PORTABLE CHEST - 1 VIEW  Comparison: 08/19/2012  Findings: A new left internal jugular dual lumen center venous catheter is seen with tips in the mid and distal SVC.  No evidence of pneumothorax.  Both lungs are clear.  Heart size is within normal limits.  Prior CABG again noted.  IMPRESSION: New left internal jugular dual lumen center venous catheter in appropriate position.  No evidence of pneumothorax or other acute findings.   Original Report Authenticated By: Danae Orleans, M.D.    Dg Fluoro Guide Cv Line-no Report  08/22/2012  CLINICAL DATA: Insertion Dialysis catheter   FLOURO GUIDE CV LINE  Fluoroscopy was utilized by the requesting physician.  No radiographic  interpretation.     Ct Maxillofacial Wo Cm  08/14/2012  *  RADIOLOGY REPORT*  Clinical Data:  Larey Seat and hit face.  Head injury.  CT HEAD WITHOUT CONTRAST CT MAXILLOFACIAL WITHOUT CONTRAST  Technique:  Multidetector CT imaging of the head and maxillofacial structures were performed using the standard protocol without intravenous contrast. Multiplanar CT image reconstructions of the maxillofacial structures were also generated.  Comparison:  CT head 03/01/2012  CT HEAD  Findings: Generalized atrophy.  Mild chronic microvascular ischemic  changes in the white matter, unchanged.  No acute infarct. Negative for intracranial hemorrhage.  Negative for mass or edema. Calvarium is intact.  IMPRESSION: Atrophy and chronic microvascular ischemia.  No acute abnormality.  CT MAXILLOFACIAL  Findings:   Negative for facial fracture.  No fracture of the orbit or nasal bone is identified.  Zygomatic arch is intact. No blood in the sinuses.  Mild mucosal edema in the paranasal sinuses.  No soft tissue mass or hematoma. Degenerative changes at C1-2 with cystic change in the dens.  Carotid artery calcification bilaterally.  IMPRESSION: Negative for fracture.   Original Report Authenticated By: Camelia Phenes, M.D.     Medications: Scheduled Meds:    . allopurinol  100 mg Oral QHS  . aspirin EC  81 mg Oral QHS  . atorvastatin  10 mg Oral q1800  . calcium carbonate  1 tablet Oral Daily  . cinacalcet  30 mg Oral Q breakfast  . darbepoetin (ARANESP) injection - DIALYSIS  200 mcg Intravenous Q Mon-HD  . midodrine  10 mg Oral Q M,W,F-HD  . multivitamin  1 tablet Oral QHS  . pantoprazole  40 mg Oral Q1200  . paricalcitol  8 mcg Intravenous Q M,W,F-HD  . polyethylene glycol  17 g Oral QHS  . sevelamer  3,200 mg Oral TID WC  . sodium chloride  3 mL Intravenous Q12H  . sodium chloride  3 mL Intravenous Q12H  . vancomycin  750 mg Intravenous Q M,W,F-HD   Continuous Infusions:    Assessment/Plan:   Micrococcus sepsis/ Fever - unclear source, probably from HD catheter. -  Blood cultures negative so far. Urine cultures grew 9000. CXR negative for pneumonia. new blood cultures pending but negative so far. - patient empirically on vancomycin and cefepime, as the blood cultures 1/2 from 9/9 from HD positive for Micrococcus Luteus and has remained on vancomycin, to be treated as strep infection per ID recommendations. - per renal, to be treated on IV antibiotics for 3 weeks from the date of catheter removal, 08/21/2012. Will discuss with ID if Ancef can  be used.    Altered mental status:Resolved, possibly secondary to anesthesia effect or #1 or hospital acquired delirium. CVA vs any septic emboli to brain this likely at this point as his mental status is back to the his baseline.  - CT head negative for any acute stroke, no meningismus signs   ESRD on hemodialysis Monday Wednesday and Friday. Renal on board for dialysis. Continued dialysis access issues which in future will require long-term decision about goals especially with his failing health    Chronic pancytopenia -  - continue to monitor counts in patient, per Dr Burna Forts, he will need hematology follow up as outpatient to be arranged at [facility] with Dr. Mariel Sleet. - transfuse PRN, received one unit of packed RBC during hemodialysis on 08/23/12   CAD status post CABG in 1998 - presently chest pain-free.    History of gout - continue present medications.   History of renal cell carcinoma status post nephrectomy.  Hypertension:stable   DVT Prophylaxis: SCD  Code Status:  Code status readdressed by renal, Dr. Arlean Hopping, now after discussion with the family, he is DO NOT RESUSCITATE status  Disposition: will DC home tomorrow after hemodialysis    LOS: 6 days   RAI,RIPUDEEP M.D. [facility] 08/25/2012, 1:52 PM Pager: [phone]  If 7PM-7AM, please contact night-coverage [email or web address] Password [email or web address]

## 2012-08-25 NOTE — Progress Notes (Signed)
Subjective:   Sitting in chair reading the newspaper, alert and oriented with no complaints, but recalls little about the last few days.  Objective: Vital signs in last 24 hours: Temp:  [97.7 F (36.5 C)-98.7 F (37.1 C)] 98.7 F (37.1 C) (09/29 0420) Pulse Rate:  [70-81] 70  (09/29 0420) Resp:  [18] 18  (09/29 0420) BP: (137-162)/(66-81) 137/81 mmHg (09/29 0420) SpO2:  [96 %-98 %] 96 % (09/29 0420) Weight:  [79.062 kg (174 lb 4.8 oz)] 79.062 kg (174 lb 4.8 oz) (09/29 0420) Weight change: 0.362 kg (12.8 oz)  Intake/Output from previous day: 09/28 0701 - 09/29 0700 In: 480 [P.O.:480] Out: 125 [Urine:125]   EXAM: General appearance:  Alert and oriented x 3, mental status back to baseline, in no apparent distress Resp:  CTA without rales, rhonchi, or wheezes Cardio:  RRR with Gr II/VI systolic murmur GI:  + BS, soft and nontender Extremities:  No edema Access:  Left IJ catheter  Lab Results:  Basename 08/24/12 1410 08/23/12 0744  WBC 2.0* 2.1*  HGB 9.2* 7.4*  HCT 28.1* 22.6*  PLT 76* 73*   BMET:  Basename 08/24/12 1410 08/23/12 0744  NA 136 135  K 4.8 5.4*  CL 96 96  CO2 27 24  GLUCOSE 106* 78  BUN 50* 73*  CREATININE 7.38* 10.34*  CALCIUM 8.5 8.7  ALBUMIN -- 2.8*   No results found for this basename: PTH:2 in the last 72 hours Iron Studies:  Basename 08/23/12 1230  IRON 144*  TIBC 258  TRANSFERRIN --  FERRITIN --   Dialysis Orders: Center: [facility] on MWF.  EDW 76 kg HD Bath 2k/2.25Ca Time 4 hrs Heparin 3000. Access Left IJ catheter BFR 400 DFR 800  Zemplar 8 mcg IV/HD Epogen 11,000 Units IV/HD Venofer 50 mg on Wed.   Assessment/Plan:  1. Micrococcus luteus sepsis - WBCs up to 2., chest x-ray negative, UA negative, 1 of 2 outpatient blood cultures on 9/9 ; new blood cultures with no growth; left IJ catheter removed 9/25 and replaced 9/26 with tip culture no growth so far, but was on antibioitics; old R thigh AVG (placed 02/29/12) and left and right old arm  accesses without evidence of infection. (Per Dr. Arlean Hopping ID said to treat micrococus like a strep infection),. Pharmacy is dosing Vanc, complete total 3 week course at dialysis from day the catheter was removed.  2. ESRD - HD on MWF in [place]; K 4.8 yesterday.  HD tomorrow. 3. Hypertension/volume - BP 137/81, receives Midodrine 10 mg prior to HD as outpatient; clear cxr post cathteter placement and added pre HD midodrine; current wt 79 with EDW 76 kg. 4. AMS - for 2 days, now resolved with mental status at baseline. Apparently family said this has happened before in hospital. Not getting meds which would cause AMS; Neurontin was DC'd.  5. Anemia - Hgb up to 9.2 from 7.4 x 2 days, s/p 1 U PRBCs; on outpatient Epogen 11,000 U and Venofer qwk, s/p Aranesp 200 mg and IV Fe. Aranesp tomorrow.  6. Metabolic bone disease - Ca 8.5 (9.5 corrected), P 5.3, Zemplar 8 mcg, Sensipar 30 mg qd, and Renvela 3 with meals, 2 with snacks.  7. Nutrition - Alb 2.8, high protein renal diet.  8. 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  9. Hx CAD - s/p CABG x4 in '98.  10. Thrombocytopenia - platelets stable.  LOS: 6 days   Andrew [name],Andrew [name] 08/25/2012,9:15 AM  Patient seen and  examined and agree with assessment and plan as above.  Andrew Moselle  MD [employer] [phone] pgr    [phone] cell 08/25/2012, 1:02 PM

## 2012-08-26 ENCOUNTER — Inpatient Hospital Stay (HOSPITAL_COMMUNITY): Payer: Medicare Other

## 2012-08-26 DIAGNOSIS — I82890 Acute embolism and thrombosis of other specified veins: Secondary | ICD-10-CM

## 2012-08-26 LAB — CBC
HCT: 26 % — ABNORMAL LOW (ref 39.0–52.0)
MCH: 33.3 pg (ref 26.0–34.0)
MCV: 97.4 fL (ref 78.0–100.0)
RBC: 2.67 MIL/uL — ABNORMAL LOW (ref 4.22–5.81)
RDW: 18.2 % — ABNORMAL HIGH (ref 11.5–15.5)
WBC: 2.8 10*3/uL — ABNORMAL LOW (ref 4.0–10.5)

## 2012-08-26 LAB — CULTURE, BLOOD (ROUTINE X 2): Culture: NO GROWTH

## 2012-08-26 LAB — RENAL FUNCTION PANEL
BUN: 85 mg/dL — ABNORMAL HIGH (ref 6–23)
CO2: 27 mEq/L (ref 19–32)
Chloride: 91 mEq/L — ABNORMAL LOW (ref 96–112)
Creatinine, Ser: 10.86 mg/dL — ABNORMAL HIGH (ref 0.50–1.35)

## 2012-08-26 MED ORDER — CEFAZOLIN SODIUM-DEXTROSE 2-3 GM-% IV SOLR
2.0000 g | INTRAVENOUS | Status: DC
  Administered 2012-08-26: 2 g via INTRAVENOUS
  Filled 2012-08-26 (×2): qty 50

## 2012-08-26 MED ORDER — CEFAZOLIN SODIUM-DEXTROSE 2-3 GM-% IV SOLR: 2.0000 g | INTRAVENOUS | Status: DC

## 2012-08-26 MED ORDER — DARBEPOETIN ALFA-POLYSORBATE 200 MCG/0.4ML IJ SOLN
INTRAMUSCULAR | Status: AC
  Administered 2012-08-26: 200 ug via INTRAVENOUS
  Filled 2012-08-26: qty 0.4

## 2012-08-26 MED ORDER — PARICALCITOL 5 MCG/ML IV SOLN
INTRAVENOUS | Status: AC
  Administered 2012-08-26: 8 ug via INTRAVENOUS
  Filled 2012-08-26: qty 2

## 2012-08-26 MED ORDER — MIDODRINE HCL 10 MG PO TABS: 10.0000 mg | ORAL_TABLET | ORAL | Status: DC

## 2012-08-26 MED ORDER — CEFAZOLIN SODIUM-DEXTROSE 2-3 GM-% IV SOLR
2.0000 g | INTRAVENOUS | Status: DC
  Filled 2012-08-26: qty 50

## 2012-08-26 NOTE — Discharge Summary (Signed)
Physician Discharge Summary  Patient ID: Andrew [name] MRN: [id number] DOB/AGE: [birth year] 76 y.o.  Admit date: 08/19/2012 Discharge date: 08/26/2012  Primary Care Physician:  [name]., MD  Discharge Diagnoses:   Micrococcus bacteremia likely catheter-related .Fever .Pancytopenia .ESRD (end stage renal disease) on dialysis .CAD (coronary artery disease) Anemia requiring one unit packed RBC transfusion  Consults: renal service                   Hematology, Dr. Clelia Croft                   Vascular surgery, Dr. Myra Gianotti        Discharge Medications:   Medication List     As of 08/26/2012  2:35 PM    TAKE these medications         acetaminophen 500 MG tablet   Commonly known as: TYLENOL   Take 1,000 mg by mouth every 6 (six) hours as needed. For pain/headache      allopurinol 100 MG tablet   Commonly known as: ZYLOPRIM   Take 100 mg by mouth at bedtime.      aspirin EC 81 MG tablet   Take 81 mg by mouth at bedtime.      atorvastatin 10 MG tablet   Commonly known as: LIPITOR   Take 10 mg by mouth every morning.      calcium carbonate 500 MG chewable tablet   Commonly known as: TUMS - dosed in mg elemental calcium   Chew 1 tablet by mouth daily as needed. For upset stomach      ceFAZolin 2-3 GM-% Solr   Commonly known as: ANCEF   Inject 50 mLs (2 g total) into the vein every Monday, Wednesday, and Friday with hemodialysis. X 3 weeks with dialysis, from 08/21/12      cinacalcet 30 MG tablet   Commonly known as: SENSIPAR   Take 30 mg by mouth daily with breakfast.      COLCRYS 0.6 MG tablet   Generic drug: colchicine   Take 0.6 mg by mouth 2 (two) times daily as needed. for gout      DIALYVITE TABLET Tabs   Take 1 tablet by mouth at bedtime.      gabapentin 100 MG capsule   Commonly known as: NEURONTIN   Take 100 mg by mouth 2 (two) times daily.      midodrine 10 MG tablet   Commonly known as: PROAMATINE   Take 1 tablet (10 mg total) by mouth every  Monday, Wednesday, and Friday with hemodialysis.      MIRALAX powder   Generic drug: polyethylene glycol powder   Take 17 g by mouth at bedtime.      omeprazole 40 MG capsule   Commonly known as: PRILOSEC   Take 40 mg by mouth at bedtime. For reflux      sevelamer 800 MG tablet   Commonly known as: RENVELA   Take 1,600-3,200 mg by mouth 5 (five) times daily. 4 tabs 3 x daily and 2 with snack         Brief H and P: For complete details please refer to admission H and P, but in brief, 78 year old male with known history of ESRD on hemodialysis on Monday Wednesday and Friday, history of CAD status post CABG, gout, nephrectomy for renal cell cancer, chronic pancytopenia had been experiencing fever for last 2 weeks. Patient was initially noticed to have fever after dialysis 2 weeks ago and as  per daughter the fever was around 104F. Patient was started on empiric antibiotics during dialysis after obtaining cultures which were not available on the day of admission.Since patient had still persistent fever patient was instructed to come to the ER. In the ER patient was mildly febrile with chronic pancytopenia. Chest x-ray was not showing anything acute. Blood culture were obtained and patient was admitted for further observation and management.    Hospital Course: Patient was admitted for further observation and management as source of the fever was not clear at the time of admission. His initial fever was post dialysis on 9/9 when blood culture were obtained and patient was placed on vancomycin and Fortaz empirically. 1/2 of blood cultures came back positive for micrococcus luteus and antibiotics were continued. Patient however continued to have fever and hence was admitted for further workup and subsequently likely from HD catheter which was removed on 08/21/2012. With vascular surgery consultation, new Left IJ diatek was placed. Patient had an episode of altered mental status and delerium s/p  catheter placement which has now resolved.  Micrococcus sepsis/ Fever - unclear source, probably from HD catheter.  - Blood cultures negative so far. Urine cultures grew 9000. CXR negative for pneumonia. new blood cultures negative so far.  - patient was empirically on vancomycin and cefepime, as the blood cultures 1/2 from 9/9 from HD were positive for Micrococcus Luteus and remained on vancomycin, to be treated as strep infection per ID recommendations.  - per renal, to be treated on IV antibiotics for 3 weeks from the date of catheter removal, 08/21/2012. I discussed with Dr. Ninetta Lights from infectious disease recommended that Ancef can be used as well. First dose of Ancef was given today after the dialysis.   Altered mental status: Resolved, possibly secondary to anesthesia effect or #1 or hospital acquired delirium. CVA vs any septic emboli to brain is less likely at this point as his mental status is back to the his baseline.  - CT head was done and was negative for any acute stroke. Patient demonstrated no meningismus signs.   ESRD on hemodialysis Monday Wednesday and Friday. Continued dialysis access issues which in future will require long-term decision about goals especially with his failing health   Chronic pancytopenia -  Hematology consult was obtained and per Dr Burna Forts, he will need hematology follow up as outpatient to be arranged at [facility] with Dr. Mariel Sleet. Patient received one unit of packed RBC during hemodialysis on 08/23/12.   CAD status post CABG in 1998 - presently chest pain-free and stable.     Day of Discharge BP 124/78  Pulse 82  Temp 98 F (36.7 C) (Oral)  Resp 18  Ht 5\' 8"  (1.727 m)  Wt 78.4 kg (172 lb 13.5 oz)  BMI 26.28 kg/m2  SpO2 98%  Physical Exam: General: Alert and awake oriented x3 not in any acute distress. HEENT: anicteric sclera, pupils reactive to light and accommodation CVS: S1-S2 clear Chest: clear to auscultation bilaterally, no  wheezing rales or rhonchi Abdomen: soft nontender, nondistended, normal bowel sounds, no organomegaly Extremities: no cyanosis, clubbing or edema noted bilaterally left IJ catheter Neuro: Cranial nerves II-XII intact, no focal neurological deficits   The results of significant diagnostics from this hospitalization (including imaging, microbiology, ancillary and laboratory) are listed below for reference.    LAB RESULTS: Basic Metabolic Panel:  Lab 08/26/12 4098 08/24/12 1410  NA 133* 136  K 5.3* 4.8  CL 91* 96  CO2 27 27  GLUCOSE 79 106*  BUN 85* 50*  CREATININE 10.86* 7.38*  CALCIUM 8.6 8.5  MG -- --  PHOS 6.3* --   Liver Function Tests:  Lab 08/26/12 0718 08/23/12 0744 08/20/12 0310 08/19/12 2158  AST -- -- 75* 46*  ALT -- -- 34 32  ALKPHOS -- -- 165* 173*  BILITOT -- -- 0.4 0.4  PROT -- -- 6.8 6.6  ALBUMIN 3.0* 2.8* -- --   CBC:  Lab 08/26/12 0718 08/24/12 1410 08/22/12 0635  WBC 2.8* 2.0* --  NEUTROABS -- -- 0.6*  HGB 8.9* 9.2* --  HCT 26.0* 28.1* --  MCV 97.4 -- --  PLT 83* 76* --    Significant Diagnostic Studies:  No results found.   Disposition and Follow-up:     Discharge Orders    Future Orders Please Complete By Expires   Increase activity slowly      Discharge instructions      Comments:   Discharge diet: Renal diet       DISPOSITION: home  DIET: renal diet  ACTIVITY: as tolerated   DISCHARGE FOLLOW-UP Follow-up Information    Follow up with [name]., MD. Schedule an appointment as soon as possible for a visit in 10 days. (for hospital follow-up)    Contact information:   [address] [place] [address] [phone]       Follow up with Randall An, MD. Schedule an appointment as soon as possible for a visit in 10 days. (for pancytopenia (low/abnormal blood counts))    Contact information:   [address] [place] [address] [phone]       Follow up with Garnetta Buddy, MD.  Schedule an appointment as soon as possible for a visit in 2 weeks.   Contact information:   [address] [place] [address] [phone]          Time spent on Discharge: 45 mins  Signed:   Deserie Dirks M.D. [facility] 08/26/2012, 2:35 PM Pager: [phone]

## 2012-08-26 NOTE — Progress Notes (Signed)
Events noted in the last few days.  Labs reviewed. Counts are improving as expected. Cytopenias are likely dur to infection or acute illness.    Assessment:  78 year old with pancytopenia. This is could be due to acute illness, infection and possible antibiotics. This is could also be early MDS as  Well.   Recommendation:  I would recommend  hematology follow up as out patient in the next month. This is needs to be arranged at [facility] (with Dr. Mariel Sleet) based on patient and family preference of being close to home.

## 2012-08-26 NOTE — Progress Notes (Signed)
Subjective:   Seen early on dialysis, no complaints, planning for DC after dialysis; however, catheter is running poorly, secondary to high arterial pressures.  Objective: Vital signs in last 24 hours: Temp:  [97.4 F (36.3 C)-98.7 F (37.1 C)] 98.7 F (37.1 C) (09/30 0416) Pulse Rate:  [86-88] 86  (09/30 0416) Resp:  [18] 18  (09/30 0416) BP: (138-168)/(71-80) 162/78 mmHg (09/30 0416) SpO2:  [97 %-98 %] 98 % (09/30 0416) Weight change:   Intake/Output from previous day: 09/29 0701 - 09/30 0700 In: 600 [P.O.:600] Out: -    EXAM: General appearance:  Alert, in no apparent distress Resp:  CTA without rales, rhonchi, or wheezes Cardio:  RRR with Gr II/VI systolic murmur GI:  + BS, soft and nontender Extremities:  No edema Access:  Left IJ catheter with BFR 300 cc/min  Lab Results:  Basename 08/24/12 1410 08/23/12 0744  WBC 2.0* 2.1*  HGB 9.2* 7.4*  HCT 28.1* 22.6*  PLT 76* 73*   BMET:  Basename 08/24/12 1410 08/23/12 0744  NA 136 135  K 4.8 5.4*  CL 96 96  CO2 27 24  GLUCOSE 106* 78  BUN 50* 73*  CREATININE 7.38* 10.34*  CALCIUM 8.5 8.7  ALBUMIN -- 2.8*   No results found for this basename: PTH:2 in the last 72 hours Iron Studies:  Basename 08/23/12 1230  IRON 144*  TIBC 258  TRANSFERRIN --  FERRITIN --   Dialysis Orders: Center: [facility] on MWF.  EDW 76 kg HD Bath 2k/2.25Ca Time 4 hrs Heparin 3000. Access Left IJ catheter BFR 400 DFR 800  Zemplar 8 mcg IV/HD Epogen 11,000 Units IV/HD Venofer 50 mg on Wed.   Assessment/Plan:  1. Micrococcus luteus sepsis - WBCs up to 2, chest x-ray negative, UA negative, 1 of 2 outpatient blood cultures on 9/9 ; new blood cultures with no growth; left IJ catheter removed 9/25 and replaced 9/26 with tip culture no growth so far, but was on antibioitics; old R thigh AVG (placed 02/29/12) and left and right old arm accesses without evidence of infection. (Per Dr. Arlean Hopping ID said to treat micrococus like a strep infection),.  Pharmacy is dosing Vanc, complete total 3 week course at dialysis from day the catheter was removed (through 10/16).   2. ESRD - HD on MWF in [place]; last K 4.8. HD today. 3. Hypertension/volume - BP 138/74, receives Midodrine 10 mg prior to HD as outpatient; clear cxr post cathteter placement and added pre HD midodrine; current wt 79 with EDW 76 kg. UF goal of 4 L today. 4. AMS - for 2 days, now resolved with mental status at baseline. Apparently family said this has happened before in hospital. Not getting meds which would cause AMS; Neurontin was DC'd.  5. Anemia - Hgb up to 9.2 from 7.4 x 2 days, s/p 1 U PRBCs; on outpatient Epogen 11,000 U and Venofer qwk, Aranesp 200 mg and IV Fe today.  6. Metabolic bone disease - Ca 8.5 (9.5 corrected), P 5.3, Zemplar 8 mcg, Sensipar 30 mg qd, and Renvela 3 with meals, 2 with snacks.  7. Nutrition - Alb 2.8, high protein renal diet.  8. 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  9. Hx CAD - s/p CABG x4 in '98.  10. Thrombocytopenia - platelets stable.  LOS: 7 days   [name],Andrew 08/26/2012,6:36 AM   I have seen and examined Andrew [name].  L IJ catheter is working better (400 BFR). Micrococcus luteus isolated  from blood cult at outpt HD center (only in aerobic bottle).   BP is low (81/43) with goal of 4 L.  Pre HD weight today 79.7 KG (EDW outpatient 76 KG).  Will d/c UF (d/c fluid removal)--may need higher EDW. K 5.3, hgb 8.9 this AM.  Fe/TIBC 56% sat.--on 200 aranesp/week. Received 1 Unit PRBC on 27 Sep.   Plts 83K.  Ca 8.6, phos 6.3.   OK for d/c if BP OK post HD.   To F/U with Dr. Mariel Sleet for pancytopenia

## 2012-09-23 ENCOUNTER — Encounter: Payer: Self-pay | Admitting: Vascular Surgery

## 2012-09-24 ENCOUNTER — Ambulatory Visit (INDEPENDENT_AMBULATORY_CARE_PROVIDER_SITE_OTHER): Payer: Medicare Other | Admitting: Vascular Surgery

## 2012-09-24 ENCOUNTER — Encounter: Payer: Self-pay | Admitting: Vascular Surgery

## 2012-09-24 VITALS — BP 154/56 | HR 85 | Ht 68.0 in | Wt 172.6 lb

## 2012-09-24 DIAGNOSIS — N186 End stage renal disease: Secondary | ICD-10-CM

## 2012-09-24 NOTE — Progress Notes (Signed)
Subjective:     Patient ID: Andrew [name], male   DOB: 11-11-1926, 78 y.o.   MRN: [id number]  HPI this 78 year old male is referred for vascular access evaluation. Patient has had failed grafts in both upper extremities and has a right IJ occlusion. He currently has a left IJ dialysis catheter. He has had a right thigh graft with multiple attempts to revise and it has failed rapidly on each occasion. He has never had a left thigh graft.  Past Medical History  Diagnosis Date  . Secondary hyperparathyroidism   . Peripheral vascular disease   . Hyperlipidemia   . Duodenal ulcer     remote  . Gout STABLE  . ESRD (end stage renal disease)     Hemodialysis since 2003; Nephrologist-Dr. Caryn Section  . Arteriosclerotic cardiovascular disease (ASCVD) 1998    CABG-1998; h/o CHF; Atherosclerosis of great vessels, aorta and coronaries on CT in 02/2012  . Cerebrovascular disease   . Neuropathy of foot   . Degenerative joint disease   . Gastroesophageal reflux disease   . History of bladder cancer   . Impaired hearing     Bilateral; hearing aids relatively ineffective  . Anemia     Prior blood transfusion  . Renal cell carcinoma 2001    s/p right nephrectomy-2001; subsequent ESRD  . Hypertension   . Cholelithiasis 03/2011    Diagnosed incidentally on abdominal ultrasound in 03/2011  . Hepatic steatosis     History  Substance Use Topics  . Smoking status: Former Smoker -- 1.0 packs/day for 25 years    Types: Cigarettes    Quit date: 02/26/1984  . Smokeless tobacco: Never Used   Comment: quit 1985  . Alcohol Use: No    Family History  Problem Relation Age of Onset  . Colon cancer Neg Hx   . Liver disease Neg Hx   . Anesthesia problems Neg Hx   . Heart disease Father     No Known Allergies  Current outpatient prescriptions:acetaminophen (TYLENOL) 500 MG tablet, Take 1,000 mg by mouth every 6 (six) hours as needed. For pain/headache, Disp: , Rfl: ;  allopurinol (ZYLOPRIM) 100 MG tablet, Take  100 mg by mouth at bedtime. , Disp: , Rfl: ;  aspirin EC 81 MG tablet, Take 81 mg by mouth at bedtime. , Disp: , Rfl: ;  atorvastatin (LIPITOR) 10 MG tablet, Take 10 mg by mouth every morning. , Disp: , Rfl:  B Complex-C-Folic Acid (DIALYVITE TABLET) TABS, Take 1 tablet by mouth at bedtime. , Disp: , Rfl: ;  calcium carbonate (TUMS - DOSED IN MG ELEMENTAL CALCIUM) 500 MG chewable tablet, Chew 1 tablet by mouth daily as needed. For upset stomach, Disp: , Rfl: ;  ceFAZolin (ANCEF) 2-3 GM-% SOLR, Inject 50 mLs (2 g total) into the vein every Monday, Wednesday, and Friday with hemodialysis. X 3 weeks with dialysis, from 08/21/12, Disp: , Rfl:  cinacalcet (SENSIPAR) 30 MG tablet, Take 30 mg by mouth daily with breakfast. , Disp: , Rfl: ;  colchicine (COLCRYS) 0.6 MG tablet, Take 0.6 mg by mouth 2 (two) times daily as needed. for gout, Disp: , Rfl: ;  gabapentin (NEURONTIN) 100 MG capsule, Take 100 mg by mouth 2 (two) times daily. , Disp: , Rfl:  midodrine (PROAMATINE) 10 MG tablet, Take 1 tablet (10 mg total) by mouth every Monday, Wednesday, and Friday with hemodialysis., Disp: 30 tablet, Rfl: 3;  omeprazole (PRILOSEC) 40 MG capsule, Take 40 mg by mouth at bedtime. For reflux,  Disp: , Rfl: ;  polyethylene glycol powder (MIRALAX) powder, Take 17 g by mouth at bedtime. , Disp: , Rfl:  sevelamer (RENVELA) 800 MG tablet, Take 1,600-3,200 mg by mouth 5 (five) times daily. 4 tabs 3 x daily and 2 with snack, Disp: , Rfl:  No current facility-administered medications for this visit. Facility-Administered Medications Ordered in Other Visits: albuterol (PROVENTIL) (5 MG/ML) 0.5% nebulizer solution 2.5 mg, 2.5 mg, Nebulization, Q2H PRN, Maree Krabbe, MD;  methylPREDNISolone sodium succinate (SOLU-MEDROL) 125 mg/2 mL injection 60 mg, 60 mg, Intravenous, Once, Maree Krabbe, MD  BP 154/56  Pulse 85  Ht 5\' 8"  (1.727 m)  Wt 172 lb 9.6 oz (78.291 kg)  BMI 26.24 kg/m2  SpO2 100%  Body mass index is 26.24  kg/(m^2).          Review of Systems denies chest pain but does have dyspnea on exertion. Complains of pain in both feet due to neuropathy. Past occasional reflux esophagitis. History of coronary artery bypass grafting now stable     Objective:   Physical Exam blood pressure 154/56 heart rate 85 respirations 18 Gen. well-developed well-nourished male in no apparent stress alert and oriented x3 has dialysis catheter in place in left upper chest Lungs no rhonchi or wheezing Cardiovascular regular rhythm no murmurs Upper extremities old failed grafts in bilateral upper extremities Abdomen soft nontender Left lower extremity with 3+ femoral pulse and well perfused left foot right leg with thrombosed thigh graft    Assessment:     End-stage renal disease needs access-only option remaining is left thigh graft    Plan:     Plan insertion of left thigh AVG on Thursday, November 7.

## 2012-09-26 ENCOUNTER — Other Ambulatory Visit: Payer: Self-pay

## 2012-09-27 ENCOUNTER — Other Ambulatory Visit: Payer: Self-pay | Admitting: *Deleted

## 2012-09-27 ENCOUNTER — Encounter (HOSPITAL_COMMUNITY): Payer: Self-pay | Admitting: Pharmacy Technician

## 2012-09-27 NOTE — Telephone Encounter (Signed)
Opened in Error.

## 2012-09-30 ENCOUNTER — Other Ambulatory Visit: Payer: Self-pay | Admitting: *Deleted

## 2012-10-06 ENCOUNTER — Inpatient Hospital Stay (HOSPITAL_COMMUNITY): Payer: Medicare Other

## 2012-10-06 ENCOUNTER — Inpatient Hospital Stay (HOSPITAL_COMMUNITY)
Admission: EM | Admit: 2012-10-06 | Discharge: 2012-10-09 | DRG: 264 | Disposition: A | Discharge status: Home / Self Care | Payer: Medicare Other | Attending: Nephrology | Admitting: Nephrology

## 2012-10-06 ENCOUNTER — Encounter (HOSPITAL_COMMUNITY): Payer: Self-pay | Admitting: *Deleted

## 2012-10-06 ENCOUNTER — Emergency Department (HOSPITAL_COMMUNITY): Payer: Medicare Other

## 2012-10-06 DIAGNOSIS — Z79899 Other long term (current) drug therapy: Secondary | ICD-10-CM

## 2012-10-06 DIAGNOSIS — Z992 Dependence on renal dialysis: Secondary | ICD-10-CM

## 2012-10-06 DIAGNOSIS — G5790 Unspecified mononeuropathy of unspecified lower limb: Secondary | ICD-10-CM | POA: Diagnosis present

## 2012-10-06 DIAGNOSIS — Z85528 Personal history of other malignant neoplasm of kidney: Secondary | ICD-10-CM

## 2012-10-06 DIAGNOSIS — Z951 Presence of aortocoronary bypass graft: Secondary | ICD-10-CM

## 2012-10-06 DIAGNOSIS — I120 Hypertensive chronic kidney disease with stage 5 chronic kidney disease or end stage renal disease: Secondary | ICD-10-CM | POA: Diagnosis present

## 2012-10-06 DIAGNOSIS — M109 Gout, unspecified: Secondary | ICD-10-CM | POA: Diagnosis present

## 2012-10-06 DIAGNOSIS — I2510 Atherosclerotic heart disease of native coronary artery without angina pectoris: Secondary | ICD-10-CM | POA: Diagnosis present

## 2012-10-06 DIAGNOSIS — I509 Heart failure, unspecified: Principal | ICD-10-CM | POA: Diagnosis present

## 2012-10-06 DIAGNOSIS — Z8551 Personal history of malignant neoplasm of bladder: Secondary | ICD-10-CM

## 2012-10-06 DIAGNOSIS — Z905 Acquired absence of kidney: Secondary | ICD-10-CM

## 2012-10-06 DIAGNOSIS — Z7982 Long term (current) use of aspirin: Secondary | ICD-10-CM

## 2012-10-06 DIAGNOSIS — Z87891 Personal history of nicotine dependence: Secondary | ICD-10-CM

## 2012-10-06 DIAGNOSIS — D638 Anemia in other chronic diseases classified elsewhere: Secondary | ICD-10-CM | POA: Diagnosis present

## 2012-10-06 DIAGNOSIS — N2581 Secondary hyperparathyroidism of renal origin: Secondary | ICD-10-CM | POA: Diagnosis present

## 2012-10-06 DIAGNOSIS — N186 End stage renal disease: Secondary | ICD-10-CM | POA: Diagnosis present

## 2012-10-06 DIAGNOSIS — E785 Hyperlipidemia, unspecified: Secondary | ICD-10-CM | POA: Diagnosis present

## 2012-10-06 DIAGNOSIS — I739 Peripheral vascular disease, unspecified: Secondary | ICD-10-CM | POA: Diagnosis present

## 2012-10-06 DIAGNOSIS — I959 Hypotension, unspecified: Secondary | ICD-10-CM | POA: Diagnosis not present

## 2012-10-06 DIAGNOSIS — M1990 Unspecified osteoarthritis, unspecified site: Secondary | ICD-10-CM | POA: Diagnosis present

## 2012-10-06 DIAGNOSIS — K219 Gastro-esophageal reflux disease without esophagitis: Secondary | ICD-10-CM | POA: Diagnosis present

## 2012-10-06 LAB — POCT I-STAT, CHEM 8
BUN: 57 mg/dL — ABNORMAL HIGH (ref 6–23)
Calcium, Ion: 1.03 mmol/L — ABNORMAL LOW (ref 1.13–1.30)
Chloride: 98 mEq/L (ref 96–112)
Creatinine, Ser: 7.9 mg/dL — ABNORMAL HIGH (ref 0.50–1.35)

## 2012-10-06 LAB — CBC WITH DIFFERENTIAL/PLATELET
Basophils Relative: 1 % (ref 0–1)
Eosinophils Relative: 8 % — ABNORMAL HIGH (ref 0–5)
HCT: 28.9 % — ABNORMAL LOW (ref 39.0–52.0)
Hemoglobin: 9.4 g/dL — ABNORMAL LOW (ref 13.0–17.0)
Lymphs Abs: 1 10*3/uL (ref 0.7–4.0)
MCH: 35.7 pg — ABNORMAL HIGH (ref 26.0–34.0)
MCV: 109.9 fL — ABNORMAL HIGH (ref 78.0–100.0)
Monocytes Absolute: 0.2 10*3/uL (ref 0.1–1.0)
Neutro Abs: 1.7 10*3/uL (ref 1.7–7.7)
RBC: 2.63 MIL/uL — ABNORMAL LOW (ref 4.22–5.81)

## 2012-10-06 LAB — POCT I-STAT TROPONIN I: Troponin i, poc: 0.03 ng/mL (ref 0.00–0.08)

## 2012-10-06 MED ORDER — ACETAMINOPHEN 650 MG RE SUPP
650.0000 mg | Freq: Four times a day (QID) | RECTAL | Status: DC | PRN
  Filled 2012-10-06: qty 1

## 2012-10-06 MED ORDER — HEPARIN SODIUM (PORCINE) 1000 UNIT/ML DIALYSIS
2000.0000 [IU] | INTRAMUSCULAR | Status: DC | PRN
  Administered 2012-10-06: 2000 [IU] via INTRAVENOUS_CENTRAL
  Filled 2012-10-06: qty 2

## 2012-10-06 MED ORDER — ALTEPLASE 2 MG IJ SOLR: 2.0000 mg | Freq: Once | INTRAMUSCULAR | Status: AC | PRN

## 2012-10-06 MED ORDER — PENTAFLUOROPROP-TETRAFLUOROETH EX AERO: 1.0000 "application " | INHALATION_SPRAY | CUTANEOUS | Status: DC | PRN

## 2012-10-06 MED ORDER — NEPRO/CARBSTEADY PO LIQD: 237.0000 mL | ORAL | Status: DC | PRN

## 2012-10-06 MED ORDER — HEPARIN SODIUM (PORCINE) 1000 UNIT/ML DIALYSIS: 1000.0000 [IU] | INTRAMUSCULAR | Status: DC | PRN

## 2012-10-06 MED ORDER — ONDANSETRON HCL 4 MG PO TABS
4.0000 mg | ORAL_TABLET | Freq: Four times a day (QID) | ORAL | Status: DC | PRN
  Filled 2012-10-06: qty 1

## 2012-10-06 MED ORDER — SODIUM CHLORIDE 0.9 % IV SOLN: 100.0000 mL | INTRAVENOUS | Status: DC | PRN

## 2012-10-06 MED ORDER — LIDOCAINE-PRILOCAINE 2.5-2.5 % EX CREA: 1.0000 "application " | TOPICAL_CREAM | CUTANEOUS | Status: DC | PRN

## 2012-10-06 MED ORDER — ACETAMINOPHEN 325 MG PO TABS: 650.0000 mg | ORAL_TABLET | Freq: Four times a day (QID) | ORAL | Status: DC | PRN

## 2012-10-06 MED ORDER — ONDANSETRON HCL 4 MG/2ML IJ SOLN: 4.0000 mg | Freq: Four times a day (QID) | INTRAMUSCULAR | Status: DC | PRN

## 2012-10-06 MED ORDER — LIDOCAINE HCL (PF) 1 % IJ SOLN: 5.0000 mL | INTRAMUSCULAR | Status: DC | PRN

## 2012-10-06 NOTE — H&P (Addendum)
Andrew [name] is an 78 y.o. male.  Chief Complaint: SOB HPI: 78 yo WM with HTN, CABG '99, gout, bladder cancers and renal cell Ca with R nephrectomy. He has ESRD on HD since 2003. Patient presents with SOB started today about mid-day. +orthopnea. Says they "did not get all the fluid off on Friday". Dry weight 76 kg. No fever, prod cough or CP. Slight chest pressure today when he walks and gets SOB.  ROS  no n/v/d or abd pain  no joint pain or swelling  no itching, HA or sore throat  no confusion or hallucination   Past Medical History:  Past Medical History  Diagnosis Date  . Secondary hyperparathyroidism   . Peripheral vascular disease   . Hyperlipidemia   . Duodenal ulcer     remote  . Gout STABLE  . ESRD (end stage renal disease)     Hemodialysis since 2003; Nephrologist-Dr. Caryn Section  . Arteriosclerotic cardiovascular disease (ASCVD) 1998    CABG-1998; h/o CHF; Atherosclerosis of great vessels, aorta and coronaries on CT in 02/2012  . Cerebrovascular disease   . Neuropathy of foot   . Degenerative joint disease   . Gastroesophageal reflux disease   . History of bladder cancer   . Impaired hearing     Bilateral; hearing aids relatively ineffective  . Anemia     Prior blood transfusion  . Renal cell carcinoma 2001    s/p right nephrectomy-2001; subsequent ESRD  . Hypertension   . Cholelithiasis 03/2011    Diagnosed incidentally on abdominal ultrasound in 03/2011  . Hepatic steatosis     Past Surgical History:  Past Surgical History  Procedure Date  . Right nephrectomy 2001    Renal cell carcinoma  . Colonoscopy 01/24/2011    prominent vascular pattern, suboptimal prep but doable  . Esophagogastroduodenoscopy 01/24/2011    mild erosive reflux esophagitis, bulbar/antral erosions, bx from antrum benign  . Av fistula repair 2008    revision of anastomosis of Right AVF  . Arteriovenous graft placement 2006    Thrombectomy and interposition jump graft revision to higher axillary  vein of LUA AVG  . Coronary artery bypass graft 1998    X4 VESSELS  . Multiple cysto/ resection tumor's with bx's LAST ONE 05-09-2011  . Multiple surg's / interventions for avgg/ fistula right upper arm LAST REVISION 06-29-2011    (JUNE 2010 STENT CEPHALIC VEIN/ 03-23-2011 DILATATION ANGIOPLASTY)  . Left ptc left renal artery   . Cardiac catheterization 2004  . Cataract extraction w/ intraocular lens  implant, bilateral   . Cystoscopy w/ retrogrades 12/28/2011    Procedure: CYSTOSCOPY WITH RETROGRADE PYELOGRAM;  Surgeon: [name], MD;  Location: [name];  Service: Urology;  Laterality: Left;  C-ARM   . Transurethral resection of bladder tumor 12/28/2011    Procedure: TRANSURETHRAL RESECTION OF BLADDER TUMOR (TURBT);  Surgeon: [name], MD;  Location[facility];  Service: Urology;  Laterality: N/A;  . Av fistula placement 02/29/2012    Procedure: INSERTION OF ARTERIOVENOUS (AV) GORE-TEX GRAFT THIGH;  Surgeon: [name], MD;  Location: [facility] OR;  Service: Vascular;  Laterality: Right;  Insertion right femoral arteriovenous gortex graft  . Ligation of right braciocephalic av fistula 04-04-2012  . Insertion of dialysis catheter 06/05/2012    Procedure: INSERTION OF DIALYSIS CATHETER;  Surgeon: Pryor Ochoa, MD;  Location: [facility] OR;  Service: Vascular;  Laterality: Left;  Insertion diatek catheter left IJ  . Insertion of  dialysis catheter 08/22/2012    Procedure: INSERTION OF DIALYSIS CATHETER;  Surgeon: Nada Libman, MD;  Location: [facility];  Service: Vascular;  Laterality: Left;  insertion of dialysis catheter left  internal jugular 27cm    Family History:  Family History  Problem Relation Age of Onset  . Colon cancer Neg Hx   . Liver disease Neg Hx   . Anesthesia problems Neg Hx   . Heart disease Father    Social History:  reports that he quit smoking about 28 years ago. His smoking use included Cigarettes. He has a 25 pack-year smoking  history. He has never used smokeless tobacco. He reports that he does not drink alcohol or use illicit drugs.  Allergies: No Known Allergies  Home medications: Prior to Admission medications   Medication Sig Start Date End Date Taking? Authorizing Provider  acetaminophen (TYLENOL) 500 MG tablet Take 1,000 mg by mouth every 6 (six) hours as needed. For pain/headache   Yes Historical Provider, MD  allopurinol (ZYLOPRIM) 100 MG tablet Take 100 mg by mouth at bedtime.  09/19/11  Yes Rhetta Mura, MD  aspirin EC 81 MG tablet Take 81 mg by mouth at bedtime.    Yes Historical Provider, MD  atorvastatin (LIPITOR) 10 MG tablet Take 10 mg by mouth every morning.    Yes Historical Provider, MD  B Complex-C-Folic Acid (DIALYVITE TABLET) TABS Take 1 tablet by mouth at bedtime.  05/24/12  Yes Historical Provider, MD  calcium carbonate (TUMS - DOSED IN MG ELEMENTAL CALCIUM) 500 MG chewable tablet Chew 1 tablet by mouth daily as needed. For upset stomach   Yes Historical Provider, MD  cinacalcet (SENSIPAR) 30 MG tablet Take 30 mg by mouth daily with breakfast.    Yes Historical Provider, MD  colchicine (COLCRYS) 0.6 MG tablet Take 0.6 mg by mouth 2 (two) times daily as needed. for gout   Yes Historical Provider, MD  gabapentin (NEURONTIN) 100 MG capsule Take 100 mg by mouth 2 (two) times daily.  01/09/12 01/08/13 Yes Kathlen Brunswick, MD  midodrine (PROAMATINE) 10 MG tablet Take 1 tablet (10 mg total) by mouth every Monday, Wednesday, and Friday with hemodialysis. 08/26/12  Yes Ripudeep Jenna Luo, MD  omeprazole (PRILOSEC) 40 MG capsule Take 40 mg by mouth at bedtime. For reflux   Yes Historical Provider, MD  polyethylene glycol powder (MIRALAX) powder Take 17 g by mouth at bedtime.    Yes Historical Provider, MD  sevelamer (RENVELA) 800 MG tablet Take 1,600-3,200 mg by mouth 5 (five) times daily. 4 tabs 3 x daily and 2 with snack   Yes Historical Provider, MD   Labs: Basic Metabolic Panel:  Lab 10/06/12  2003  NA 136  K 5.2*  CL 98  CO2 --  GLUCOSE 88  BUN 57*  CREATININE 7.90*  ALB --  CALCIUM --  PHOS --   Liver Function Tests: No results found for this basename: AST:3,ALT:3,ALKPHOS:3,BILITOT:3,PROT:3,ALBUMIN:3 in the last 168 hours No results found for this basename: LIPASE:3,AMYLASE:3 in the last 168 hours No results found for this basename: AMMONIA:3 in the last 168 hours CBC:  Lab 10/06/12 2005 10/06/12 2003  WBC 3.1* --  NEUTROABS 1.7 --  HGB 9.4* 9.9*  HCT 28.9* 29.0*  MCV 109.9* --  PLT 144* --   Xrays/Other Studies: Dg Chest 2 View  10/06/2012  *RADIOLOGY REPORT*  Clinical Data: Shortness of breath  CHEST - 2 VIEW  Comparison: 08/22/2012  Findings: Left chest wall dialysis catheters noted  with tips in the distal SVC and cavoatrial junction.  Previous median sternotomy CABG procedure.  Small bilateral pleural effusions and mild interstitial edema is noted.  No airspace consolidation.  IMPRESSION:  1.  Mild CHF.   Original Report Authenticated By: Signa Kell, M.D.     Physical Exam:  Blood pressure 166/85, pulse 94, temperature 98.3 F (36.8 C), temperature source Oral, resp. rate 21, SpO2 100.00%.  Gen: elderly WM, no distress, mild SOB apparent, no use of acc muscles Skin: no rash, cyanosis HEENT:  EOMI, sclera anicteric, throat clear Neck: no JVD, no bruits or LAN Chest: crackles L base > R Heart: regular, no rub or gallop, loud SEM RUSB Abdomen: soft, nontender, no mass or HSM Ext: old HD access in both arms and R thigh, no edema Neuro: alert, Ox3, no focal deficit, no asterixisi  CXR- mild CHF  Outpatient HD: 4 hrs, [place], 76 kg EDW, MWF   Impression 1. Dyspnea due to pulm edema from vol overload 2. ESRD, usual hd is mwf. On HD x 11years, using TDC now, for new thigh graft this week on left side. 3. Hx CABG '99 4. Hx nephrectomy for RCC 5. Hx of bladder cancers treated locally 6. Hx gout 7. Hyperlipidemia  Plan- admit, HD upstairs  tonight. May be d/c'd home after dialysis if he is stable. Weigh pre HD (dry wt 76)    Vinson Moselle  MD [facility] [phone] pgr    [phone] cell 10/06/2012, 10:45 PM

## 2012-10-06 NOTE — ED Notes (Signed)
Here for sob & CP, onset 1700. Has HD MWF, Fridays exchange was shortened. Denies pain at this time. Sx worse when up walking around, R arm restricted. Alert, NAD, calm, interactive, skin W&D, resps e/u, speaking in clear complete sentences. HOH, hearing aids in both ears. family present. No meds PTA.

## 2012-10-06 NOTE — ED Notes (Signed)
Pt states he started with SOB about 1700 today. States that it is not bad when he is still but worsens with activity.

## 2012-10-06 NOTE — ED Provider Notes (Signed)
History     CSN: [id number]  Arrival date & time 10/06/12  2536   First MD Initiated Contact with Patient 10/06/12 2244      Chief Complaint  Patient presents with  . Shortness of Breath  . Chest Pain    (Consider location/radiation/quality/duration/timing/severity/associated sxs/prior treatment) Patient is a 78 y.o. male presenting with shortness of breath and chest pain. The history is provided by the patient.  Shortness of Breath  Associated symptoms include chest pain and shortness of breath.  Chest Pain Primary symptoms include shortness of breath.   He is on hemodialysis every Monday Wednesday Friday. At last dialysis, he was 1.1 kg above his dry weight. This afternoon, he started getting dyspneic. He denies chest pain, heaviness, tightness, pressure. There's been no nausea or vomiting. He states he has been trying to avoid drinking too much fluid but may have had a foods with too much salt. His dyspnea is worse with exertion better with rest. Symptoms are moderate to severe.  Past Medical History  Diagnosis Date  . Secondary hyperparathyroidism   . Peripheral vascular disease   . Hyperlipidemia   . Duodenal ulcer     remote  . Gout STABLE  . ESRD (end stage renal disease)     Hemodialysis since 2003; Nephrologist-Dr. Caryn Section  . Arteriosclerotic cardiovascular disease (ASCVD) 1998    CABG-1998; h/o CHF; Atherosclerosis of great vessels, aorta and coronaries on CT in 02/2012  . Cerebrovascular disease   . Neuropathy of foot   . Degenerative joint disease   . Gastroesophageal reflux disease   . History of bladder cancer   . Impaired hearing     Bilateral; hearing aids relatively ineffective  . Anemia     Prior blood transfusion  . Renal cell carcinoma 2001    s/p right nephrectomy-2001; subsequent ESRD  . Hypertension   . Cholelithiasis 03/2011    Diagnosed incidentally on abdominal ultrasound in 03/2011  . Hepatic steatosis     Past Surgical History  Procedure  Date  . Right nephrectomy 2001    Renal cell carcinoma  . Colonoscopy 01/24/2011    prominent vascular pattern, suboptimal prep but doable  . Esophagogastroduodenoscopy 01/24/2011    mild erosive reflux esophagitis, bulbar/antral erosions, bx from antrum benign  . Av fistula repair 2008    revision of anastomosis of Right AVF  . Arteriovenous graft placement 2006    Thrombectomy and interposition jump graft revision to higher axillary vein of LUA AVG  . Coronary artery bypass graft 1998    X4 VESSELS  . Multiple cysto/ resection tumor's with bx's LAST ONE 05-09-2011  . Multiple surg's / interventions for avgg/ fistula right upper arm LAST REVISION 06-29-2011    (JUNE 2010 STENT CEPHALIC VEIN/ 03-23-2011 DILATATION ANGIOPLASTY)  . Left ptc left renal artery   . Cardiac catheterization 2004  . Cataract extraction w/ intraocular lens  implant, bilateral   . Cystoscopy w/ retrogrades 12/28/2011    Procedure: CYSTOSCOPY WITH RETROGRADE PYELOGRAM;  Surgeon: [name], MD;  Location: [facility];  Service: Urology;  Laterality: Left;  C-ARM   . Transurethral resection of bladder tumor 12/28/2011    Procedure: TRANSURETHRAL RESECTION OF BLADDER TUMOR (TURBT);  Surgeon: [name], MD;  Location: [facility];  Service: Urology;  Laterality: N/A;  . Av fistula placement 02/29/2012    Procedure: INSERTION OF ARTERIOVENOUS (AV) GORE-TEX GRAFT THIGH;  Surgeon: [name], MD;  Location: [facility] OR;  Service: Vascular;  Laterality: Right;  Insertion right femoral arteriovenous gortex graft  . Ligation of right braciocephalic av fistula 04-04-2012  . Insertion of dialysis catheter 06/05/2012    Procedure: INSERTION OF DIALYSIS CATHETER;  Surgeon: Pryor Ochoa, MD;  Location: [facility] OR;  Service: Vascular;  Laterality: Left;  Insertion diatek catheter left IJ  . Insertion of dialysis catheter 08/22/2012    Procedure: INSERTION OF DIALYSIS CATHETER;  Surgeon: Nada Libman, MD;   Location: [facility];  Service: Vascular;  Laterality: Left;  insertion of dialysis catheter left  internal jugular 27cm    Family History  Problem Relation Age of Onset  . Colon cancer Neg Hx   . Liver disease Neg Hx   . Anesthesia problems Neg Hx   . Heart disease Father     History  Substance Use Topics  . Smoking status: Former Smoker -- 1.0 packs/day for 25 years    Types: Cigarettes    Quit date: 02/26/1984  . Smokeless tobacco: Never Used     Comment: quit 1985  . Alcohol Use: No      Review of Systems  Respiratory: Positive for shortness of breath.   Cardiovascular: Positive for chest pain.  All other systems reviewed and are negative.    Allergies  Review of patient's allergies indicates no known allergies.  Home Medications   Current Outpatient Rx  Name  Route  Sig  Dispense  Refill  . ACETAMINOPHEN 500 MG PO TABS   Oral   Take 1,000 mg by mouth every 6 (six) hours as needed. For pain/headache         . ALLOPURINOL 100 MG PO TABS   Oral   Take 100 mg by mouth at bedtime.          . ASPIRIN EC 81 MG PO TBEC   Oral   Take 81 mg by mouth at bedtime.          . ATORVASTATIN CALCIUM 10 MG PO TABS   Oral   Take 10 mg by mouth every morning.          Marland Kitchen DIALYVITE PO TABS   Oral   Take 1 tablet by mouth at bedtime.          Marland Kitchen CALCIUM CARBONATE ANTACID 500 MG PO CHEW   Oral   Chew 1 tablet by mouth daily as needed. For upset stomach         . CINACALCET HCL 30 MG PO TABS   Oral   Take 30 mg by mouth daily with breakfast.          . COLCHICINE 0.6 MG PO TABS   Oral   Take 0.6 mg by mouth 2 (two) times daily as needed. for gout         . GABAPENTIN 100 MG PO CAPS   Oral   Take 100 mg by mouth 2 (two) times daily.          Marland Kitchen MIDODRINE HCL 10 MG PO TABS   Oral   Take 1 tablet (10 mg total) by mouth every Monday, Wednesday, and Friday with hemodialysis.   30 tablet   3   . OMEPRAZOLE 40 MG PO CPDR   Oral   Take 40 mg by  mouth at bedtime. For reflux         . POLYETHYLENE GLYCOL 3350 PO POWD   Oral   Take 17 g by mouth at bedtime.          Marland Kitchen  SEVELAMER CARBONATE 800 MG PO TABS   Oral   Take 1,600-3,200 mg by mouth 5 (five) times daily. 4 tabs 3 x daily and 2 with snack           BP 166/85  Pulse 94  Temp 98.3 F (36.8 C) (Oral)  Resp 21  SpO2 100%  Physical Exam  Nursing note and vitals reviewed. 78 year old male, resting comfortably and in no acute distress. Vital signs are significant for hypertension with blood pressure 166/85, and mild tachypnea with respiratory rate of 21. Oxygen saturation is 100%, which is normal. Head is normocephalic and atraumatic. PERRLA, EOMI. Oropharynx is clear. Neck is nontender and supple without adenopathy or JVD. Back is nontender and there is no CVA tenderness. Lungs are clear without rales, wheezes, or rhonchi. Chest is nontender. Dialysis access catheters present in the left subclavian area. Heart has regular rate and rhythm without murmur. Abdomen is soft, flat, nontender without masses or hepatosplenomegaly and peristalsis is normoactive. Extremities have no cyanosis or edema, full range of motion is present. Skin is warm and dry without rash. Neurologic: Mental status is normal, cranial nerves are intact, there are no motor or sensory deficits.   ED Course  Procedures (including critical care time)  Results for orders placed during the hospital encounter of 10/06/12  CBC WITH DIFFERENTIAL      Component Value Range   WBC 3.1 (*) 4.0 - 10.5 K/uL   RBC 2.63 (*) 4.22 - 5.81 MIL/uL   Hemoglobin 9.4 (*) 13.0 - 17.0 g/dL   HCT 16.1 (*) 09.6 - 04.5 %   MCV 109.9 (*) 78.0 - 100.0 fL   MCH 35.7 (*) 26.0 - 34.0 pg   MCHC 32.5  30.0 - 36.0 g/dL   RDW 40.9 (*) 81.1 - 91.4 %   Platelets 144 (*) 150 - 400 K/uL   Neutrophils Relative 54  43 - 77 %   Lymphocytes Relative 32  12 - 46 %   Monocytes Relative 5  3 - 12 %   Eosinophils Relative 8 (*) 0 - 5  %   Basophils Relative 1  0 - 1 %   Neutro Abs 1.7  1.7 - 7.7 K/uL   Lymphs Abs 1.0  0.7 - 4.0 K/uL   Monocytes Absolute 0.2  0.1 - 1.0 K/uL   Eosinophils Absolute 0.2  0.0 - 0.7 K/uL   Basophils Absolute 0.0  0.0 - 0.1 K/uL   RBC Morphology POLYCHROMASIA PRESENT    POCT I-STAT, CHEM 8      Component Value Range   Sodium 136  135 - 145 mEq/L   Potassium 5.2 (*) 3.5 - 5.1 mEq/L   Chloride 98  96 - 112 mEq/L   BUN 57 (*) 6 - 23 mg/dL   Creatinine, Ser 7.82 (*) 0.50 - 1.35 mg/dL   Glucose, Bld 88  70 - 99 mg/dL   Calcium, Ion 9.56 (*) 1.13 - 1.30 mmol/L   TCO2 27  0 - 100 mmol/L   Hemoglobin 9.9 (*) 13.0 - 17.0 g/dL   HCT 21.3 (*) 08.6 - 57.8 %  POCT I-STAT TROPONIN I      Component Value Range   Troponin i, poc 0.03  0.00 - 0.08 ng/mL   Comment 3            Dg Chest 2 View  10/06/2012  *RADIOLOGY REPORT*  Clinical Data: Shortness of breath  CHEST - 2 VIEW  Comparison: 08/22/2012  Findings: Left  chest wall dialysis catheters noted with tips in the distal SVC and cavoatrial junction.  Previous median sternotomy CABG procedure.  Small bilateral pleural effusions and mild interstitial edema is noted.  No airspace consolidation.  IMPRESSION:  1.  Mild CHF.   Original Report Authenticated By: Signa Kell, M.D.       1. CHF exacerbation   2. End stage renal disease on dialysis       MDM  CHF exacerbation due to fluid overload from end-stage renal disease. His nephrologist has been in to see him and will taken for emergent dialysis.        Dione Booze, MD 10/06/12 2258

## 2012-10-07 ENCOUNTER — Inpatient Hospital Stay (HOSPITAL_COMMUNITY): Payer: Medicare Other

## 2012-10-07 LAB — BASIC METABOLIC PANEL
BUN: 23 mg/dL (ref 6–23)
CO2: 32 mEq/L (ref 19–32)
Chloride: 99 mEq/L (ref 96–112)
GFR calc Af Amer: 12 mL/min — ABNORMAL LOW (ref >90)
Potassium: 4.7 mEq/L (ref 3.5–5.1)

## 2012-10-07 LAB — MRSA PCR SCREENING: MRSA by PCR: NEGATIVE

## 2012-10-07 MED ORDER — ALLOPURINOL 100 MG PO TABS
200.0000 mg | ORAL_TABLET | Freq: Every day | ORAL | Status: DC
  Administered 2012-10-07 – 2012-10-09 (×3): 200 mg via ORAL
  Filled 2012-10-07 (×3): qty 2

## 2012-10-07 MED ORDER — POLYETHYLENE GLYCOL 3350 17 G PO PACK
17.0000 g | PACK | Freq: Every day | ORAL | Status: DC | PRN
  Administered 2012-10-07: 17 g via ORAL
  Filled 2012-10-07: qty 1

## 2012-10-07 MED ORDER — ATORVASTATIN CALCIUM 10 MG PO TABS
10.0000 mg | ORAL_TABLET | Freq: Every day | ORAL | Status: DC
  Administered 2012-10-07 – 2012-10-08 (×2): 10 mg via ORAL
  Filled 2012-10-07 (×3): qty 1

## 2012-10-07 MED ORDER — DARBEPOETIN ALFA-POLYSORBATE 200 MCG/0.4ML IJ SOLN
200.0000 ug | INTRAMUSCULAR | Status: DC
  Administered 2012-10-09: 200 ug via INTRAVENOUS

## 2012-10-07 MED ORDER — SODIUM CHLORIDE 0.9 % IV SOLN
62.5000 mg | INTRAVENOUS | Status: DC
  Administered 2012-10-09: 62.5 mg via INTRAVENOUS
  Filled 2012-10-07 (×2): qty 5

## 2012-10-07 MED ORDER — DEXTROSE 5 % IV SOLN
1.5000 g | INTRAVENOUS | Status: AC
  Administered 2012-10-08: 1.5 g via INTRAVENOUS
  Filled 2012-10-07: qty 1.5

## 2012-10-07 MED ORDER — PANTOPRAZOLE SODIUM 40 MG PO TBEC
40.0000 mg | DELAYED_RELEASE_TABLET | Freq: Two times a day (BID) | ORAL | Status: DC
  Administered 2012-10-07 – 2012-10-09 (×3): 40 mg via ORAL
  Filled 2012-10-07 (×4): qty 1

## 2012-10-07 MED ORDER — MIDODRINE HCL 5 MG PO TABS
10.0000 mg | ORAL_TABLET | ORAL | Status: DC
  Administered 2012-10-09: 10 mg via ORAL
  Filled 2012-10-07 (×2): qty 2

## 2012-10-07 MED ORDER — GABAPENTIN 100 MG PO CAPS
200.0000 mg | ORAL_CAPSULE | Freq: Every day | ORAL | Status: DC
  Administered 2012-10-07 – 2012-10-08 (×2): 200 mg via ORAL
  Filled 2012-10-07 (×3): qty 2

## 2012-10-07 MED ORDER — SODIUM CHLORIDE 0.9 % IV SOLN: INTRAVENOUS | Status: DC

## 2012-10-07 MED ORDER — RENA-VITE PO TABS
1.0000 | ORAL_TABLET | Freq: Every day | ORAL | Status: DC
  Administered 2012-10-07 – 2012-10-09 (×3): 1 via ORAL
  Filled 2012-10-07 (×3): qty 1

## 2012-10-07 MED ORDER — PARICALCITOL 5 MCG/ML IV SOLN
8.0000 ug | INTRAVENOUS | Status: DC
  Administered 2012-10-09: 8 ug via INTRAVENOUS
  Filled 2012-10-07: qty 1.6

## 2012-10-07 MED ORDER — SEVELAMER CARBONATE 800 MG PO TABS
2400.0000 mg | ORAL_TABLET | Freq: Three times a day (TID) | ORAL | Status: DC
  Administered 2012-10-07 – 2012-10-08 (×3): 2400 mg via ORAL
  Filled 2012-10-07 (×9): qty 3

## 2012-10-07 NOTE — Progress Notes (Signed)
Pt transferred from Hemodialysis and admitted to floor. Pt placed on tele. Pt comes from home he is AOx3, [facility], ambulatory with assistance. He has no signs of skin issues at this time. Pt oriented to unit, staff, safety measure, and policies. Call bell light in place and bed alarm on.

## 2012-10-07 NOTE — Progress Notes (Signed)
Progress Note:   Pt to have left thigh graft placed in am by Dr. Edilia Bo  Orders written

## 2012-10-07 NOTE — Progress Notes (Signed)
Subjective:  Breathing much better s/p HD early this AM, comfortable in bed, no complaints.  Objective: Vital signs in last 24 hours: Temp:  [97.5 F (36.4 C)-98.9 F (37.2 C)] 97.8 F (36.6 C) (11/11 0500) Pulse Rate:  [74-112] 91  (11/11 0500) Resp:  [16-25] 22  (11/11 0500) BP: (51-206)/(31-107) 125/67 mmHg (11/11 0500) SpO2:  [93 %-100 %] 96 % (11/11 0500) Weight:  [75.9 kg (167 lb 5.3 oz)-78.9 kg (173 lb 15.1 oz)] 75.9 kg (167 lb 5.3 oz) (11/11 0500) Weight change:   Intake/Output from previous day: 11/10 0701 - 11/11 0700 In: 240 [P.O.:240] Out: 2602    EXAM: General appearance:  Alert, in no apparent distress Resp:  CTA without rales, rhonchi, or wheezes Cardio:  RRR with Gr II/VI systolic murmur, no rub GI:  + BS, soft and nontender Extremities:  No edema Access:  Left IJ catheter  Lab Results:  Basename 10/06/12 2005 10/06/12 2003  WBC 3.1* --  HGB 9.4* 9.9*  HCT 28.9* 29.0*  PLT 144* --   BMET:  Basename 10/06/12 2003  NA 136  K 5.2*  CL 98  CO2 --  GLUCOSE 88  BUN 57*  CREATININE 7.90*  CALCIUM --  ALBUMIN --   No results found for this basename: PTH:2 in the last 72 hours Iron Studies: No results found for this basename: IRON,TIBC,TRANSFERRIN,FERRITIN in the last 72 hours  Outpatient Dialysis Orders:  MWF @ [place]; 4 hrs; EDW 76 kg; left IJ catheter; BFR 400/DFR 800; 2K/2.25Ca bath; Heparin 3000 U; Epogen 19,800 U; Zemplar 8 mcg; Venofer 50 mg on Wed.  Assessment/Plan: 1. Dyspnea - secondary to fluid overload, but post-HD wt on 11/8 was 77.1 kg (EDW 76) after only 0.9 L removed; now 75.9 kg s/p net UF 2.6 L this AM. 2. New access placement - Hx failed grafts in both upper extremities, also Hx infected catheter (replaced 9/26); previously scheduled left thigh AVG placement tomorrow AM per dr. Edilia Bo. 3. ESRD HD on MWF @ [place]; K 5.2 pre-HD today.   4. Hypotension/Volume - BP 125/67 on Midodrine 10 mg pre-HD; now at EDW s/p HD this  AM. 5. Anemia - Hgb 9.4 on outpatient Epogen 19,800 U and weekly Venofer (Wed).  Aranesp 200 mcg and IV Fe on Wed. 6. Secondary hyperparathyroidism - Ca 8.6, last P 4.9, last iPTH 154; on Zemplar 8 mcg, Sensipar 30 mg qd, and Renvela (3 with meals, 2 with snacks). 7. Nutrition - Last Alb 4. 8. Hx gout - on Allopurinol.   LOS: 1 day   Andrew [name],Andrew [name] 10/07/2012,9:51 AM Andrew [name] has less SOB.  He is scheduled to be here 5:30 AM tomorrow for L thigh  AVG placement.  Will watch here overnight and not d/c today so he can get procedure in AM.

## 2012-10-07 NOTE — Progress Notes (Signed)
Placed 4 phone calls, unable to reach pt. Left message on home number with NPO status, meds to take in AM, arrival time, phone number to call for questions.

## 2012-10-08 ENCOUNTER — Ambulatory Visit (HOSPITAL_COMMUNITY): Admission: RE | Admit: 2012-10-08 | Payer: Medicare Other | Source: Ambulatory Visit | Admitting: Vascular Surgery

## 2012-10-08 ENCOUNTER — Encounter (HOSPITAL_COMMUNITY): Payer: Self-pay | Admitting: Anesthesiology

## 2012-10-08 ENCOUNTER — Inpatient Hospital Stay (HOSPITAL_COMMUNITY): Payer: Medicare Other | Admitting: Anesthesiology

## 2012-10-08 ENCOUNTER — Encounter (HOSPITAL_COMMUNITY)
Admission: EM | Disposition: A | Discharge status: Home / Self Care | Payer: Self-pay | Source: Home / Self Care | Attending: Nephrology

## 2012-10-08 ENCOUNTER — Telehealth: Payer: Self-pay | Admitting: *Deleted

## 2012-10-08 DIAGNOSIS — N186 End stage renal disease: Secondary | ICD-10-CM

## 2012-10-08 HISTORY — PX: AV FISTULA PLACEMENT: SHX1204

## 2012-10-08 LAB — PROTIME-INR
INR: 1.1 (ref 0.00–1.49)
Prothrombin Time: 14.1 seconds (ref 11.6–15.2)

## 2012-10-08 LAB — BASIC METABOLIC PANEL
CO2: 30 mEq/L (ref 19–32)
Calcium: 9.4 mg/dL (ref 8.4–10.5)
Chloride: 97 mEq/L (ref 96–112)
Creatinine, Ser: 7.04 mg/dL — ABNORMAL HIGH (ref 0.50–1.35)
GFR calc Af Amer: 7 mL/min — ABNORMAL LOW (ref >90)
Sodium: 138 mEq/L (ref 135–145)

## 2012-10-08 LAB — CBC
HCT: 26.2 % — ABNORMAL LOW (ref 39.0–52.0)
MCV: 109.2 fL — ABNORMAL HIGH (ref 78.0–100.0)
Platelets: 134 10*3/uL — ABNORMAL LOW (ref 150–400)
RBC: 2.4 MIL/uL — ABNORMAL LOW (ref 4.22–5.81)
RDW: 21.1 % — ABNORMAL HIGH (ref 11.5–15.5)
WBC: 3.1 10*3/uL — ABNORMAL LOW (ref 4.0–10.5)

## 2012-10-08 LAB — SURGICAL PCR SCREEN: Staphylococcus aureus: NEGATIVE

## 2012-10-08 SURGERY — INSERTION OF ARTERIOVENOUS (AV) GORE-TEX GRAFT THIGH
Anesthesia: General | Site: Thigh | Laterality: Left | Wound class: Clean

## 2012-10-08 MED ORDER — LIDOCAINE HCL (CARDIAC) 20 MG/ML IV SOLN
INTRAVENOUS | Status: DC | PRN
  Administered 2012-10-08: 60 mg via INTRAVENOUS

## 2012-10-08 MED ORDER — PROPOFOL 10 MG/ML IV BOLUS
INTRAVENOUS | Status: DC | PRN
  Administered 2012-10-08: 150 mg via INTRAVENOUS

## 2012-10-08 MED ORDER — HEPARIN SODIUM (PORCINE) 1000 UNIT/ML IJ SOLN
INTRAMUSCULAR | Status: DC | PRN
  Administered 2012-10-08: 7 mL via INTRAVENOUS

## 2012-10-08 MED ORDER — SODIUM CHLORIDE 0.9 % IV SOLN: 100.0000 mL | INTRAVENOUS | Status: DC | PRN

## 2012-10-08 MED ORDER — 0.9 % SODIUM CHLORIDE (POUR BTL) OPTIME
TOPICAL | Status: DC | PRN
  Administered 2012-10-08: 1000 mL

## 2012-10-08 MED ORDER — ALTEPLASE 2 MG IJ SOLR
2.0000 mg | Freq: Once | INTRAMUSCULAR | Status: AC | PRN
  Filled 2012-10-08: qty 2

## 2012-10-08 MED ORDER — PROTAMINE SULFATE 10 MG/ML IV SOLN
INTRAVENOUS | Status: DC | PRN
  Administered 2012-10-08: 40 mg via INTRAVENOUS
  Administered 2012-10-08: 20 mg via INTRAVENOUS

## 2012-10-08 MED ORDER — HEPARIN SODIUM (PORCINE) 1000 UNIT/ML DIALYSIS
20.0000 [IU]/kg | INTRAMUSCULAR | Status: DC | PRN
  Administered 2012-10-09: 1500 [IU] via INTRAVENOUS_CENTRAL
  Filled 2012-10-08: qty 2

## 2012-10-08 MED ORDER — SODIUM CHLORIDE 0.9 % IV SOLN
INTRAVENOUS | Status: DC | PRN
  Administered 2012-10-08: 07:00:00 via INTRAVENOUS

## 2012-10-08 MED ORDER — MORPHINE SULFATE 2 MG/ML IJ SOLN: 1.0000 mg | INTRAMUSCULAR | Status: DC | PRN

## 2012-10-08 MED ORDER — HEPARIN SODIUM (PORCINE) 1000 UNIT/ML DIALYSIS
1000.0000 [IU] | INTRAMUSCULAR | Status: DC | PRN
  Filled 2012-10-08: qty 1

## 2012-10-08 MED ORDER — FENTANYL CITRATE 0.05 MG/ML IJ SOLN
INTRAMUSCULAR | Status: DC | PRN
  Administered 2012-10-08: 50 ug via INTRAVENOUS

## 2012-10-08 MED ORDER — LIDOCAINE HCL (PF) 1 % IJ SOLN
INTRAMUSCULAR | Status: DC | PRN
  Administered 2012-10-08: 5 mL

## 2012-10-08 MED ORDER — PHENYLEPHRINE HCL 10 MG/ML IJ SOLN
10.0000 mg | INTRAVENOUS | Status: DC | PRN
  Administered 2012-10-08: 40 ug/min via INTRAVENOUS

## 2012-10-08 MED ORDER — ONDANSETRON HCL 4 MG/2ML IJ SOLN
INTRAMUSCULAR | Status: DC | PRN
  Administered 2012-10-08: 4 mg via INTRAVENOUS

## 2012-10-08 MED ORDER — LIDOCAINE-PRILOCAINE 2.5-2.5 % EX CREA: 1.0000 "application " | TOPICAL_CREAM | CUTANEOUS | Status: DC | PRN

## 2012-10-08 MED ORDER — OXYCODONE HCL 5 MG PO TABS
5.0000 mg | ORAL_TABLET | ORAL | Status: DC | PRN
  Administered 2012-10-09: 10 mg via ORAL

## 2012-10-08 MED ORDER — LIDOCAINE HCL (PF) 1 % IJ SOLN: 5.0000 mL | INTRAMUSCULAR | Status: DC | PRN

## 2012-10-08 MED ORDER — EPHEDRINE SULFATE 50 MG/ML IJ SOLN
INTRAMUSCULAR | Status: DC | PRN
  Administered 2012-10-08: 10 mg via INTRAVENOUS
  Administered 2012-10-08 (×2): 5 mg via INTRAVENOUS
  Administered 2012-10-08: 10 mg via INTRAVENOUS

## 2012-10-08 MED ORDER — PHENYLEPHRINE HCL 10 MG/ML IJ SOLN
INTRAMUSCULAR | Status: DC | PRN
  Administered 2012-10-08 (×4): 80 ug via INTRAVENOUS
  Administered 2012-10-08: 40 ug via INTRAVENOUS
  Administered 2012-10-08: 80 ug via INTRAVENOUS
  Administered 2012-10-08: 40 ug via INTRAVENOUS
  Administered 2012-10-08 (×3): 80 ug via INTRAVENOUS

## 2012-10-08 MED ORDER — SODIUM CHLORIDE 0.9 % IR SOLN
Status: DC | PRN
  Administered 2012-10-08: 08:00:00

## 2012-10-08 MED ORDER — HYDROMORPHONE HCL PF 1 MG/ML IJ SOLN: 0.2500 mg | INTRAMUSCULAR | Status: DC | PRN

## 2012-10-08 MED ORDER — PENTAFLUOROPROP-TETRAFLUOROETH EX AERO: 1.0000 "application " | INHALATION_SPRAY | CUTANEOUS | Status: DC | PRN

## 2012-10-08 MED ORDER — NEPRO/CARBSTEADY PO LIQD
237.0000 mL | ORAL | Status: DC | PRN
  Filled 2012-10-08: qty 237

## 2012-10-08 MED ORDER — THROMBIN 20000 UNITS EX SOLR
CUTANEOUS | Status: AC
  Filled 2012-10-08: qty 20000

## 2012-10-08 MED ORDER — THROMBIN 20000 UNITS EX KIT
PACK | CUTANEOUS | Status: DC | PRN
  Administered 2012-10-08: 20000 [IU] via TOPICAL

## 2012-10-08 SURGICAL SUPPLY — 39 items
ADH SKN CLS APL DERMABOND .7 (GAUZE/BANDAGES/DRESSINGS) ×1
CANISTER SUCTION 2500CC (MISCELLANEOUS) ×2 IMPLANT
CLIP TI MEDIUM 6 (CLIP) ×2 IMPLANT
CLIP TI WIDE RED SMALL 6 (CLIP) ×2 IMPLANT
CLOTH BEACON ORANGE TIMEOUT ST (SAFETY) ×2 IMPLANT
COVER SURGICAL LIGHT HANDLE (MISCELLANEOUS) ×2 IMPLANT
DERMABOND ADVANCED (GAUZE/BANDAGES/DRESSINGS) ×1
DERMABOND ADVANCED .7 DNX12 (GAUZE/BANDAGES/DRESSINGS) ×1 IMPLANT
DRAPE INCISE IOBAN 66X45 STRL (DRAPES) ×2 IMPLANT
ELECT REM PT RETURN 9FT ADLT (ELECTROSURGICAL) ×2
ELECTRODE REM PT RTRN 9FT ADLT (ELECTROSURGICAL) ×1 IMPLANT
GLOVE BIO SURGEON STRL SZ 6.5 (GLOVE) ×3 IMPLANT
GLOVE BIO SURGEON STRL SZ7.5 (GLOVE) ×2 IMPLANT
GLOVE BIOGEL PI IND STRL 7.0 (GLOVE) IMPLANT
GLOVE BIOGEL PI IND STRL 7.5 (GLOVE) IMPLANT
GLOVE BIOGEL PI IND STRL 8 (GLOVE) ×1 IMPLANT
GLOVE BIOGEL PI INDICATOR 7.0 (GLOVE) ×3
GLOVE BIOGEL PI INDICATOR 7.5 (GLOVE) ×2
GLOVE BIOGEL PI INDICATOR 8 (GLOVE) ×1
GLOVE ECLIPSE 6.0 STRL STRAW (GLOVE) ×1 IMPLANT
GLOVE SURG SS PI 7.5 STRL IVOR (GLOVE) ×2 IMPLANT
GOWN EXTRA PROTECTION XL (GOWNS) ×2 IMPLANT
GOWN STRL NON-REIN LRG LVL3 (GOWN DISPOSABLE) ×5 IMPLANT
GRAFT GORETEX STRT 4-7X45 (Vascular Products) ×1 IMPLANT
KIT BASIN OR (CUSTOM PROCEDURE TRAY) ×2 IMPLANT
KIT ROOM TURNOVER OR (KITS) ×2 IMPLANT
NS IRRIG 1000ML POUR BTL (IV SOLUTION) ×2 IMPLANT
PACK CV ACCESS (CUSTOM PROCEDURE TRAY) ×2 IMPLANT
PAD ARMBOARD 7.5X6 YLW CONV (MISCELLANEOUS) ×4 IMPLANT
SPONGE SURGIFOAM ABS GEL 100 (HEMOSTASIS) IMPLANT
SUT PROLENE 6 0 BV (SUTURE) ×5 IMPLANT
SUT VIC AB 2-0 CTB1 (SUTURE) ×2 IMPLANT
SUT VIC AB 3-0 SH 27 (SUTURE) ×4
SUT VIC AB 3-0 SH 27X BRD (SUTURE) ×2 IMPLANT
SUT VICRYL 4-0 PS2 18IN ABS (SUTURE) ×4 IMPLANT
TOWEL OR 17X24 6PK STRL BLUE (TOWEL DISPOSABLE) ×2 IMPLANT
TOWEL OR 17X26 10 PK STRL BLUE (TOWEL DISPOSABLE) ×2 IMPLANT
UNDERPAD 30X30 INCONTINENT (UNDERPADS AND DIAPERS) ×2 IMPLANT
WATER STERILE IRR 1000ML POUR (IV SOLUTION) ×2 IMPLANT

## 2012-10-08 NOTE — Transfer of Care (Signed)
Immediate Anesthesia Transfer of Care Note  Patient: Andrew [name]  Procedure(s) Performed: Procedure(s) (LRB) with comments: INSERTION OF ARTERIOVENOUS (AV) GORE-TEX GRAFT THIGH (Left)  Patient Location: PACU  Anesthesia Type:General  Level of Consciousness: awake, alert  and oriented  Airway & Oxygen Therapy: Patient Spontanous Breathing  Post-op Assessment: Report given to PACU RN and Post -op Vital signs reviewed and stable  Post vital signs: Reviewed and stable  Complications: No apparent anesthesia complications

## 2012-10-08 NOTE — Progress Notes (Signed)
[facility]  Subjective:  Awake, alert, lying flat in bed without dyspnea--daughter Talbert Forest Smothers from [facility]) at bedside.  S/P L thigh AVG earlier today from Dr. Edilia Bo.     Objective: Vital signs in last 24 hours: Blood pressure 110/53, pulse 88, temperature 97.1 F (36.2 C), temperature source Oral, resp. rate 19, height 5\' 8"  (1.727 m), weight 75 kg (165 lb 5.5 oz), SpO2 94.00%.    PHYSICAL EXAM General--as above  Chest--TDC L IJ, clear Heart--systolic m, no rub Abd--nontender Extr--AVG patent L thigh  Lab Results:   Lab 10/08/12 0530 10/07/12 0900 10/06/12 2003  NA 138 140 136  K 4.7 4.7 5.2*  CL 97 99 98  CO2 30 32 --  BUN 44* 23 57*  CREATININE 7.04* 4.57* 7.90*  ALB -- -- --  GLUCOSE 85 -- --  CALCIUM 9.4 9.0 --  PHOS -- -- --     Basename 10/08/12 0530 10/06/12 2005  WBC 3.1* 3.1*  HGB 8.4* 9.4*  HCT 26.2* 28.9*  PLT 134* 144*      Assessment/Plan: 1. Dyspnea - secondary to fluid overload.  EDW 76 kg.  Wt today 75 kg.  Will try for 74 kg tomorrow (on midodrine) 2. New access placement -new L thigh AVG in place. 3. ESRD HD on MWF @ [place]; K 5.2 pre-HD today.  4. Hypotension/Volume - BP 110/53 this AM.   on Midodrine 10 mg pre-HD; now at new EDW 5. Anemia - Hgb 8.4 this AM.   Epogen 19,800 U and weekly Venofer (Wed). Aranesp 200 mcg and IV Fe on Wed. 6. Secondary hyperparathyroidism - Ca 9.4, last P 4.9, last iPTH 154; on Zemplar 8 mcg, Sensipar 30 mg qd, and Renvela (3 with meals, 2 with snacks). 7. Nutrition - Last Alb 4. 8. Hx gout - on Allopurinol.     LOS: 2 days   Shariff Lasky F 10/08/2012,11:29 AM   .labalb

## 2012-10-08 NOTE — Anesthesia Preprocedure Evaluation (Addendum)
Anesthesia Evaluation  Patient identified by MRN, date of birth, ID band Patient awake    Reviewed: Allergy & Precautions, H&P , NPO status , Patient's Chart, lab work & pertinent test results, reviewed documented beta blocker date and time   History of Anesthesia Complications Negative for: history of anesthetic complications  Airway Mallampati: II TM Distance: >3 FB     Dental  (+) Teeth Intact, Dental Advisory Given and Caps   Pulmonary shortness of breath and with exertion, former smoker,  breath sounds clear to auscultation        Cardiovascular hypertension, Pt. on medications + CAD Rhythm:Regular Rate:Normal     Neuro/Psych  Neuromuscular disease    GI/Hepatic Neg liver ROS, PUD, GERD-  Medicated and Controlled,  Endo/Other  negative endocrine ROS  Renal/GU ESRF and DialysisRenal disease     Musculoskeletal   Abdominal   Peds  Hematology negative hematology ROS (+)   Anesthesia Other Findings Caps and bridges  Reproductive/Obstetrics                        Anesthesia Physical Anesthesia Plan  ASA: III  Anesthesia Plan: General   Post-op Pain Management:    Induction: Intravenous  Airway Management Planned: LMA  Additional Equipment:   Intra-op Plan:   Post-operative Plan: Extubation in OR  Informed Consent: I have reviewed the patients History and Physical, chart, labs and discussed the procedure including the risks, benefits and alternatives for the proposed anesthesia with the patient or authorized representative who has indicated his/her understanding and acceptance.   Dental advisory given  Plan Discussed with: CRNA, Anesthesiologist and Surgeon  Anesthesia Plan Comments:         Anesthesia Quick Evaluation

## 2012-10-08 NOTE — Progress Notes (Signed)
Utilization review completed.  

## 2012-10-08 NOTE — Telephone Encounter (Signed)
Message copied by Melene Plan on Tue Oct 08, 2012 12:02 PM ------      Message from: Chuck Hint      Created: Tue Oct 08, 2012 10:05 AM      Regarding: charge       This patient had a new left thigh AV graft. Asst. Della Goo. 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 However if the graft functions for several months and he would be a candidate for continued attempts at thrombolysis. He had a good quality vein but has careful in flow. Thank you. CSD

## 2012-10-08 NOTE — Progress Notes (Signed)
Positive bruit and thrill in left thigh AVG upon adm.to PACU and departure back to room .

## 2012-10-08 NOTE — Op Note (Signed)
NAME: Andrew [name]   MRN: [id number] DOB: [birth year]    DATE OF OPERATION: 10/08/2012  PREOP DIAGNOSIS: Chronic kidney disease  POSTOP DIAGNOSIS: Chronic kidney disease  PROCEDURE: New left thigh AV graft  SURGEON: Di Kindle. Edilia Bo, MD, FACS  ASSIST: Della Goo PA  ANESTHESIA: Gen.   EBL: minimal  INDICATIONS: Andrew [name] is a 78 y.o. male who was evaluated in the office by Dr. Hart Rochester. His only remaining option for access was a left thigh AV graft. He had a diminished left femoral pulse however given that this was the only remaining option for access we elected to proceed with attempted left thigh AV graft.  FINDINGS: he had diffuse disease in the superficial femoral artery, common femoral artery, and iliac artery. The disease in the iliac artery extended indefinitely and thus there was no option for endarterectomy. I was able to find one soft spot in the superficial femoral artery so the graft originated from this. The venous anastomosis was to the saphenous vein which was a good quality vein.  TECHNIQUE: The patient was brought to the operating room and received a general anesthetic. His abdomen was taped superiorly. The left eye was prepped and draped in the usual sterile fashion. An oblique incision was made in the left groin into this incision the saphenofemoral junction was dissected free and the saphenous vein identified. As was a good size vein. The femoral artery was dissected free beneath the fascia. The superficial femoral artery, deep femoral artery, common femoral artery, and iliac artery were all dissected free. There was diffuse calcific disease and plaque throughout the superficial femoral artery, common femoral artery, and iliac artery. I dissected up high under the inguinal ligament but was no pinpoint to his diffuse disease. There was one soft spot on the lateral anterior aspect of the superficial femoral artery proximally which I elected to use for inflow. Using  one distal counterincision a 4-7 mm tapered PTFE graft was tunneled in a loop fashion in the thigh. The patient was then heparinized. The superficial femoral artery was clamped proximally and distally and a longitudinal arteriotomy made in the soft spot. The graft was spatulated after a segment of the 4 mm end was excised. The graft was sewn end to side to the superficial femoral artery using continuous 6-0 Prolene suture. The graft is important to appropriate length for anastomosis the saphenous vein. The saphenous vein was ligated distally and spatulated. The graft was cut to the appropriate length, spatulated, and sewn end to end to the vein using continuous 6-0 Prolene suture. At the completion there was actually a reasonable thrill in the fistula. The heparin was partially reversed with protamine. The wound was closed with a deep layer of 3-0 Vicryl and the skin closed with 4-0 Vicryl at the counterincision. The groin incision was closed with a pair of 2-0 Vicryl, subcutaneous layer 3-0 Vicryl, skin closed with 4-0 subcuticular stitch. Dermabond was applied. She tolerated the procedure well and was transferred to the recovery room in stable condition. All needle and sponge counts were correct.  [name], MD, FACS [facility]  DATE OF DICTATION:   10/08/2012

## 2012-10-08 NOTE — H&P (View-Only) (Signed)
Progress Note:   Pt to have left thigh graft placed in am by Dr. Dickson  Orders written   

## 2012-10-08 NOTE — Anesthesia Postprocedure Evaluation (Signed)
  Anesthesia Post-op Note  Patient: Andrew [name]  Procedure(s) Performed: Procedure(s) (LRB) with comments: INSERTION OF ARTERIOVENOUS (AV) GORE-TEX GRAFT THIGH (Left)  Patient Location: PACU  Anesthesia Type:General  Level of Consciousness: awake  Airway and Oxygen Therapy: Patient Spontanous Breathing  Post-op Pain: mild  Post-op Assessment: Post-op Vital signs reviewed  Post-op Vital Signs: Reviewed  Complications: No apparent anesthesia complications

## 2012-10-08 NOTE — Preoperative (Signed)
Beta Blockers   Reason not to administer Beta Blockers:Not Applicable 

## 2012-10-08 NOTE — Interval H&P Note (Signed)
History and Physical Interval Note:  10/08/2012 7:19 AM  Andrew [name]  has presented today for surgery, with the diagnosis of End Stage Renal Disease  The various methods of treatment have been discussed with the patient and family. After consideration of risks, benefits and other options for treatment, the patient has consented to  Procedure(s) (LRB) with comments: INSERTION OF ARTERIOVENOUS (AV) GORE-TEX GRAFT THIGH (Left) as a surgical intervention .  The patient's history has been reviewed, patient examined, no change in status, stable for surgery.  I have reviewed the patient's chart and labs.  Questions were answered to the patient's satisfaction.     December Hedtke S

## 2012-10-09 ENCOUNTER — Encounter (HOSPITAL_COMMUNITY): Payer: Self-pay | Admitting: Vascular Surgery

## 2012-10-09 LAB — RENAL FUNCTION PANEL
BUN: 61 mg/dL — ABNORMAL HIGH (ref 6–23)
Glucose, Bld: 101 mg/dL — ABNORMAL HIGH (ref 70–99)
Phosphorus: 5.8 mg/dL — ABNORMAL HIGH (ref 2.3–4.6)
Potassium: 5.6 mEq/L — ABNORMAL HIGH (ref 3.5–5.1)

## 2012-10-09 LAB — CBC
HCT: 24 % — ABNORMAL LOW (ref 39.0–52.0)
Hemoglobin: 7.8 g/dL — ABNORMAL LOW (ref 13.0–17.0)
MCHC: 32.5 g/dL (ref 30.0–36.0)
MCV: 108.1 fL — ABNORMAL HIGH (ref 78.0–100.0)

## 2012-10-09 MED ORDER — MIDODRINE HCL 5 MG PO TABS
ORAL_TABLET | ORAL | Status: AC
  Filled 2012-10-09: qty 10

## 2012-10-09 MED ORDER — DARBEPOETIN ALFA-POLYSORBATE 200 MCG/0.4ML IJ SOLN
INTRAMUSCULAR | Status: AC
  Administered 2012-10-09: 200 ug via INTRAVENOUS
  Filled 2012-10-09: qty 0.4

## 2012-10-09 MED ORDER — OXYCODONE HCL 5 MG PO TABS
ORAL_TABLET | ORAL | Status: AC
  Administered 2012-10-09: 10 mg via ORAL
  Filled 2012-10-09: qty 2

## 2012-10-09 MED ORDER — RENA-VITE PO TABS: 1.0000 | ORAL_TABLET | Freq: Every day | ORAL | Status: DC

## 2012-10-09 MED ORDER — PARICALCITOL 5 MCG/ML IV SOLN
INTRAVENOUS | Status: AC
  Administered 2012-10-09: 8 ug via INTRAVENOUS
  Filled 2012-10-09: qty 2

## 2012-10-09 NOTE — Discharge Summary (Signed)
Physician Discharge Summary  Patient ID: Andrew [name] MRN: [id number] DOB/AGE: January 22, 1926 78 y.o.  Admit date: 10/06/2012 Discharge date: 10/09/2012  Discharge Diagnoses:  Active Problems: Mild CHF, resolved with dialysis New left thigh graft placement ESRD Anemia of chronic disease Secondary hyperparathyroidism  Consultations:  [name], MD of [facility]  Discharged Condition: good  Hospital Course:  Andrew [name] is an 77 year old patient with ESRD on dialysis on MWF in [place] who presented to the hospital on Sunday night 11/10 with shortness of breath and orthopnea since midday.  Although his dialysis dry weight is 76 kg, his weight was 77.1 kg after his last dialysis on Friday 11/8, when only 0.9 L was removed.  After emergent dialysis on Monday morning 11/11 with net ultrafiltration of 2.6 L and post-dialysis weight of 75.6 kg, he felt much better and had no further complaints.  However, he had a previously scheduled procedure for left thigh graft placement by Dr. Edilia Bo of [facility] on Tuesday morning 11/12 and remained in the hospital for the surgery.  He had his Wednesday dialysis this morning 11/13 without any problems with a post-dialysis weight of 76.1 kg and is stable for discharge.   1. Mild CHF - See above.  The patient did not reach his dry weight of 76 kg during dialysis on Friday and by Sunday developed symptoms of fluid overload, which resolved with dialysis in the hospital on Monday morning. 2. New access placement - The patient has a history of failed grafts in both upper extremities, as well as an infected catheter, which was replaced on 9/26.  He had previously scheduled placement of a left thigh graft by Dr. Edilia Bo on 11/11 and is stable. 3. ESRD - The patient tolerated dialysis on 11/11 and on 11/13 and will have his regularly scheduled outpatient dialysis in [place] on Friday 11/15. 4. Anemia - The patient's hemoglobin fell to 7.8 today, likely secondary to  surgery yesterday, but received Aranesp 200 mcg today with dialysis.  His outpatient Epogen will be increased per protocol as necessary, and he will remain on Venofer 50 mg on Wednesdays. 5. Secondary hyperparathyroidism - His calcium and phosphorus remained stable with no changes to his outpatient medications.  Treatments: dialysis: Hemodialysis on 11/11 and 11/13 Blood pressure 121/60, pulse 101, temperature 97 F (36.1 C), temperature source Oral, resp. rate 20, height 5\' 8"  (1.727 m), weight 76.1 kg (167 lb 12.3 oz), SpO2 96.00%.  Disposition: 01-Home or Self Care    Medication List     As of 10/09/2012 10:03 AM    ASK your doctor about these medications         acetaminophen 500 MG tablet   Commonly known as: TYLENOL   Take 1,000 mg by mouth every 6 (six) hours as needed. For pain/headache      allopurinol 100 MG tablet   Commonly known as: ZYLOPRIM   Take 100 mg by mouth at bedtime.      aspirin EC 81 MG tablet   Take 81 mg by mouth at bedtime.      atorvastatin 10 MG tablet   Commonly known as: LIPITOR   Take 10 mg by mouth every morning.      calcium carbonate 500 MG chewable tablet   Commonly known as: TUMS - dosed in mg elemental calcium   Chew 1 tablet by mouth daily as needed. For upset stomach      cinacalcet 30 MG tablet   Commonly known as: SENSIPAR   Take 30  mg by mouth daily with breakfast.      COLCRYS 0.6 MG tablet   Generic drug: colchicine   Take 0.6 mg by mouth 2 (two) times daily as needed. for gout      DIALYVITE TABLET Tabs   Take 1 tablet by mouth at bedtime.      gabapentin 100 MG capsule   Commonly known as: NEURONTIN   Take 100 mg by mouth 2 (two) times daily.      midodrine 10 MG tablet   Commonly known as: PROAMATINE   Take 1 tablet (10 mg total) by mouth every Monday, Wednesday, and Friday with hemodialysis.      MIRALAX powder   Generic drug: polyethylene glycol powder   Take 17 g by mouth at bedtime.      omeprazole 40 MG  capsule   Commonly known as: PRILOSEC   Take 40 mg by mouth at bedtime. For reflux      sevelamer 800 MG tablet   Commonly known as: RENVELA   Take 1,600-3,200 mg by mouth 5 (five) times daily. 4 tabs 3 x daily and 2 with snack           Follow-up Information    Follow up with [facility] [place]. (As needed)    Contact information:   [address] [place] [address]          Signed: Gerome Apley 10/09/2012, 10:03 AM

## 2012-10-09 NOTE — Progress Notes (Addendum)
VASCULAR PROGRESS NOTE  SUBJECTIVE: Pain adequately controlled.  PHYSICAL EXAM: Filed Vitals:   10/09/12 0512 10/09/12 0517 10/09/12 0600 10/09/12 0630  BP: 127/60 125/64 107/45 113/25  Pulse: 95 97 96 111  Temp:      TempSrc:      Resp: 16 22 26 21   Height:      Weight:      SpO2:       Good thrill in left thigh AVG Left foot warm  LABS: Lab Results  Component Value Date   WBC 3.5* 10/09/2012   HGB 7.8* 10/09/2012   HCT 24.0* 10/09/2012   MCV 108.1* 10/09/2012   PLT 120* 10/09/2012   ASSESSMENT/PLAN: 1. 1 Day Post-Op s/p: New Right thigh AVG. Very diseased artery.  2. Will be available if needed.  Cari Caraway Beeper: [phone] 10/09/2012

## 2012-10-09 NOTE — Progress Notes (Signed)
Subjective:   Seen on HD, comfortable, but left leg pain secondary to surgery yesterday.  Objective: Vital signs in last 24 hours: Temp:  [97.1 F (36.2 C)-98.3 F (36.8 C)] 98 F (36.7 C) (11/13 0500) Pulse Rate:  [82-100] 96  (11/13 0600) Resp:  [16-26] 26  (11/13 0600) BP: (107-139)/(45-70) 107/45 mmHg (11/13 0600) SpO2:  [93 %-99 %] 95 % (11/13 0500) Weight:  [77.5 kg (170 lb 13.7 oz)-77.6 kg (171 lb 1.2 oz)] 77.5 kg (170 lb 13.7 oz) (11/13 0500) Weight change: 2.6 kg (5 lb 11.7 oz)  Intake/Output from previous day: 11/12 0701 - 11/13 0700 In: 810 [P.O.:360; I.V.:450] Out: 20 [Blood:20]   EXAM: General appearance:  Alert, in no apparent distress Resp:  CTA without rales, rhonchi, or wheezes Cardio:  RRR with Gr II/VI systolic murmur GI: + BS, soft and nontender Extremities:  No edema Access:  Left IJ catheter with BFR 400 cc/min, new left thigh AVG with + bruit  Lab Results:  Basename 10/09/12 0520 10/08/12 0530  WBC 3.5* 3.1*  HGB 7.8* 8.4*  HCT 24.0* 26.2*  PLT 120* 134*   BMET:  Basename 10/09/12 0520 10/08/12 0530  NA 136 138  K 5.6* 4.7  CL 94* 97  CO2 28 30  GLUCOSE 101* 85  BUN 61* 44*  CREATININE 9.23* 7.04*  CALCIUM 9.2 9.4  ALBUMIN 3.1* --   No results found for this basename: PTH:2 in the last 72 hours Iron Studies: No results found for this basename: IRON,TIBC,TRANSFERRIN,FERRITIN in the last 72 hours  Outpatient Dialysis Orders: MWF @ [place]; 4 hrs; EDW 76 kg; left IJ catheter; BFR 400/DFR 800; 2K/2.25Ca bath; Heparin 3000 U; Epogen 19,800 U; Zemplar 8 mcg; Venofer 50 mg on Wed.  Assessment/Plan: 1. Dyspnea - secondary to fluid overload, but post-HD wt on 11/8 was 77.1 kg (EDW 76) after only 0.9 L removed; now 77.5 kg with UF goal of 1.5 L. 2. New access placement - Hx failed grafts in both upper extremities, also Hx infected catheter (replaced 9/26); left thigh AVG placement yesterday per Dr. Edilia Bo. 3. ESRD HD on MWF @ [place]; K 5.6  pre-HD today.  4. Hypotension/Volume - BP 107/45 on Midodrine 10 mg pre-HD; 1.5 over EDW, unable to challenge secondary to low BPs.. 5. Anemia - Hgb down to 7.8 post-surgery, on outpatient Epogen 19,800 U and weekly Venofer (Wed). Aranesp 200 mcg and IV Fe on Wed. 6. Secondary hyperparathyroidism - Ca 9.2 (9.9 corrected), P 5.8, last iPTH 154; on Zemplar 8 mcg, Sensipar 30 mg qd, and Renvela (3 with meals, 2 with snacks). 7. Nutrition - Alb 3.1. 8. Hx gout - on Allopurinol.  LOS: 3 days   Naida Escalante 10/09/2012,6:37 AM

## 2012-10-09 NOTE — Procedures (Signed)
[facility]  On HD via L IJ TDC BP--117/43 Goal--2.3 L Hgb--7.8  (was 9.9 prior to surgery.  Fe/TIBC at outpt HD ctr was 43%          and ferritin 685 (23 Oct).  Was on 19,800 EPO PTA.          Will increase outpatient EPO to 28,000 per week K+--5.6  He complains of some pain in L foot.  L foot is warm, no atheroembolic changes on toes.  OK for d/c.  F/U in [facility].

## 2012-10-11 ENCOUNTER — Ambulatory Visit (INDEPENDENT_AMBULATORY_CARE_PROVIDER_SITE_OTHER): Payer: Medicare Other | Admitting: Urology

## 2012-10-11 DIAGNOSIS — Z8551 Personal history of malignant neoplasm of bladder: Secondary | ICD-10-CM

## 2012-10-11 NOTE — Progress Notes (Signed)
I saw and examined Andrew [name].  I agree with plans as outlined above.

## 2012-10-19 ENCOUNTER — Inpatient Hospital Stay (HOSPITAL_COMMUNITY)
Admission: EM | Admit: 2012-10-19 | Discharge: 2012-10-21 | DRG: 811 | Disposition: A | Discharge status: Home / Self Care | Payer: Medicare Other | Attending: Internal Medicine | Admitting: Internal Medicine

## 2012-10-19 ENCOUNTER — Emergency Department (HOSPITAL_COMMUNITY): Payer: Medicare Other

## 2012-10-19 ENCOUNTER — Encounter (HOSPITAL_COMMUNITY): Payer: Self-pay

## 2012-10-19 DIAGNOSIS — I2510 Atherosclerotic heart disease of native coronary artery without angina pectoris: Secondary | ICD-10-CM

## 2012-10-19 DIAGNOSIS — Z79899 Other long term (current) drug therapy: Secondary | ICD-10-CM

## 2012-10-19 DIAGNOSIS — D649 Anemia, unspecified: Secondary | ICD-10-CM

## 2012-10-19 DIAGNOSIS — K7689 Other specified diseases of liver: Secondary | ICD-10-CM | POA: Diagnosis present

## 2012-10-19 DIAGNOSIS — N186 End stage renal disease: Secondary | ICD-10-CM

## 2012-10-19 DIAGNOSIS — Z7982 Long term (current) use of aspirin: Secondary | ICD-10-CM

## 2012-10-19 DIAGNOSIS — R531 Weakness: Secondary | ICD-10-CM

## 2012-10-19 DIAGNOSIS — I679 Cerebrovascular disease, unspecified: Secondary | ICD-10-CM

## 2012-10-19 DIAGNOSIS — C649 Malignant neoplasm of unspecified kidney, except renal pelvis: Secondary | ICD-10-CM

## 2012-10-19 DIAGNOSIS — K219 Gastro-esophageal reflux disease without esophagitis: Secondary | ICD-10-CM | POA: Diagnosis present

## 2012-10-19 DIAGNOSIS — D631 Anemia in chronic kidney disease: Principal | ICD-10-CM | POA: Diagnosis present

## 2012-10-19 DIAGNOSIS — D509 Iron deficiency anemia, unspecified: Secondary | ICD-10-CM | POA: Diagnosis present

## 2012-10-19 DIAGNOSIS — Z85528 Personal history of other malignant neoplasm of kidney: Secondary | ICD-10-CM

## 2012-10-19 DIAGNOSIS — Z951 Presence of aortocoronary bypass graft: Secondary | ICD-10-CM

## 2012-10-19 DIAGNOSIS — E785 Hyperlipidemia, unspecified: Secondary | ICD-10-CM

## 2012-10-19 DIAGNOSIS — R404 Transient alteration of awareness: Secondary | ICD-10-CM | POA: Diagnosis present

## 2012-10-19 DIAGNOSIS — Z9181 History of falling: Secondary | ICD-10-CM

## 2012-10-19 DIAGNOSIS — D696 Thrombocytopenia, unspecified: Secondary | ICD-10-CM

## 2012-10-19 DIAGNOSIS — K8020 Calculus of gallbladder without cholecystitis without obstruction: Secondary | ICD-10-CM | POA: Diagnosis present

## 2012-10-19 DIAGNOSIS — I82890 Acute embolism and thrombosis of other specified veins: Secondary | ICD-10-CM

## 2012-10-19 DIAGNOSIS — M1990 Unspecified osteoarthritis, unspecified site: Secondary | ICD-10-CM | POA: Diagnosis present

## 2012-10-19 DIAGNOSIS — R410 Disorientation, unspecified: Secondary | ICD-10-CM

## 2012-10-19 DIAGNOSIS — Z8551 Personal history of malignant neoplasm of bladder: Secondary | ICD-10-CM

## 2012-10-19 DIAGNOSIS — D630 Anemia in neoplastic disease: Secondary | ICD-10-CM | POA: Diagnosis present

## 2012-10-19 DIAGNOSIS — R630 Anorexia: Secondary | ICD-10-CM

## 2012-10-19 DIAGNOSIS — D61818 Other pancytopenia: Secondary | ICD-10-CM

## 2012-10-19 DIAGNOSIS — G629 Polyneuropathy, unspecified: Secondary | ICD-10-CM

## 2012-10-19 DIAGNOSIS — M109 Gout, unspecified: Secondary | ICD-10-CM | POA: Diagnosis present

## 2012-10-19 DIAGNOSIS — Z8673 Personal history of transient ischemic attack (TIA), and cerebral infarction without residual deficits: Secondary | ICD-10-CM

## 2012-10-19 DIAGNOSIS — I10 Essential (primary) hypertension: Secondary | ICD-10-CM

## 2012-10-19 DIAGNOSIS — K5900 Constipation, unspecified: Secondary | ICD-10-CM

## 2012-10-19 DIAGNOSIS — I120 Hypertensive chronic kidney disease with stage 5 chronic kidney disease or end stage renal disease: Secondary | ICD-10-CM | POA: Diagnosis present

## 2012-10-19 DIAGNOSIS — Z87891 Personal history of nicotine dependence: Secondary | ICD-10-CM

## 2012-10-19 DIAGNOSIS — I739 Peripheral vascular disease, unspecified: Secondary | ICD-10-CM

## 2012-10-19 DIAGNOSIS — E875 Hyperkalemia: Secondary | ICD-10-CM

## 2012-10-19 DIAGNOSIS — W19XXXA Unspecified fall, initial encounter: Secondary | ICD-10-CM | POA: Diagnosis present

## 2012-10-19 DIAGNOSIS — N2581 Secondary hyperparathyroidism of renal origin: Secondary | ICD-10-CM

## 2012-10-19 DIAGNOSIS — Z905 Acquired absence of kidney: Secondary | ICD-10-CM

## 2012-10-19 DIAGNOSIS — Z992 Dependence on renal dialysis: Secondary | ICD-10-CM

## 2012-10-19 DIAGNOSIS — D7589 Other specified diseases of blood and blood-forming organs: Secondary | ICD-10-CM

## 2012-10-19 HISTORY — DX: Thrombocytopenia, unspecified: D69.6

## 2012-10-19 HISTORY — DX: Other specified diseases of blood and blood-forming organs: D75.89

## 2012-10-19 LAB — COMPREHENSIVE METABOLIC PANEL
Alkaline Phosphatase: 160 U/L — ABNORMAL HIGH (ref 39–117)
BUN: 40 mg/dL — ABNORMAL HIGH (ref 6–23)
CO2: 28 mEq/L (ref 19–32)
Chloride: 94 mEq/L — ABNORMAL LOW (ref 96–112)
Creatinine, Ser: 6.63 mg/dL — ABNORMAL HIGH (ref 0.50–1.35)
GFR calc non Af Amer: 7 mL/min — ABNORMAL LOW (ref >90)
Total Bilirubin: 0.3 mg/dL (ref 0.3–1.2)

## 2012-10-19 LAB — CBC WITH DIFFERENTIAL/PLATELET
HCT: 22.7 % — ABNORMAL LOW (ref 39.0–52.0)
Hemoglobin: 7.3 g/dL — ABNORMAL LOW (ref 13.0–17.0)
Lymphocytes Relative: 13 % (ref 12–46)
Lymphs Abs: 0.6 10*3/uL — ABNORMAL LOW (ref 0.7–4.0)
MCHC: 32.2 g/dL (ref 30.0–36.0)
Monocytes Absolute: 0.4 10*3/uL (ref 0.1–1.0)
Monocytes Relative: 9 % (ref 3–12)
Neutro Abs: 3.6 10*3/uL (ref 1.7–7.7)
RBC: 2.05 MIL/uL — ABNORMAL LOW (ref 4.22–5.81)
WBC: 4.7 10*3/uL (ref 4.0–10.5)

## 2012-10-19 LAB — URINALYSIS, ROUTINE W REFLEX MICROSCOPIC
Glucose, UA: NEGATIVE mg/dL
Ketones, ur: NEGATIVE mg/dL
Leukocytes, UA: NEGATIVE
pH: 8.5 — ABNORMAL HIGH (ref 5.0–8.0)

## 2012-10-19 LAB — BLOOD GAS, VENOUS
Bicarbonate: 29.7 mEq/L — ABNORMAL HIGH (ref 20.0–24.0)
FIO2: 21 %
Patient temperature: 37
pH, Ven: 7.484 — ABNORMAL HIGH (ref 7.250–7.300)
pO2, Ven: 40.3 mmHg (ref 30.0–45.0)

## 2012-10-19 LAB — URINE MICROSCOPIC-ADD ON

## 2012-10-19 LAB — CK: Total CK: 58 U/L (ref 7–232)

## 2012-10-19 LAB — PREPARE RBC (CROSSMATCH)

## 2012-10-19 MED ORDER — SODIUM CHLORIDE 0.9 % IJ SOLN
3.0000 mL | Freq: Two times a day (BID) | INTRAMUSCULAR | Status: DC
  Administered 2012-10-20: 3 mL via INTRAVENOUS

## 2012-10-19 MED ORDER — NEPHRO-VITE 0.8 MG PO TABS
1.0000 | ORAL_TABLET | Freq: Every day | ORAL | Status: DC
  Administered 2012-10-20: 1 via ORAL
  Filled 2012-10-19 (×3): qty 1

## 2012-10-19 MED ORDER — ATORVASTATIN CALCIUM 10 MG PO TABS
10.0000 mg | ORAL_TABLET | Freq: Every morning | ORAL | Status: DC
  Administered 2012-10-20 – 2012-10-21 (×2): 10 mg via ORAL
  Filled 2012-10-19 (×3): qty 1

## 2012-10-19 MED ORDER — DOCUSATE SODIUM 100 MG PO CAPS
100.0000 mg | ORAL_CAPSULE | Freq: Two times a day (BID) | ORAL | Status: DC
  Administered 2012-10-20 – 2012-10-21 (×4): 100 mg via ORAL
  Filled 2012-10-19 (×4): qty 1

## 2012-10-19 MED ORDER — SODIUM CHLORIDE 0.9 % IV SOLN: 250.0000 mL | INTRAVENOUS | Status: DC | PRN

## 2012-10-19 MED ORDER — ALLOPURINOL 100 MG PO TABS
100.0000 mg | ORAL_TABLET | Freq: Every day | ORAL | Status: DC
  Administered 2012-10-20 (×2): 100 mg via ORAL
  Filled 2012-10-19 (×2): qty 1

## 2012-10-19 MED ORDER — ASPIRIN EC 81 MG PO TBEC
81.0000 mg | DELAYED_RELEASE_TABLET | Freq: Every day | ORAL | Status: DC
  Administered 2012-10-20 (×2): 81 mg via ORAL
  Filled 2012-10-19 (×2): qty 1

## 2012-10-19 MED ORDER — PANTOPRAZOLE SODIUM 40 MG PO TBEC
40.0000 mg | DELAYED_RELEASE_TABLET | Freq: Every day | ORAL | Status: DC
  Administered 2012-10-20 – 2012-10-21 (×2): 40 mg via ORAL
  Filled 2012-10-19 (×2): qty 1

## 2012-10-19 MED ORDER — HYDROCODONE-ACETAMINOPHEN 5-325 MG PO TABS: 1.0000 | ORAL_TABLET | ORAL | Status: DC | PRN

## 2012-10-19 MED ORDER — COLCHICINE 0.6 MG PO TABS
0.6000 mg | ORAL_TABLET | Freq: Two times a day (BID) | ORAL | Status: DC | PRN
Start: 2012-10-19 — End: 2012-10-21

## 2012-10-19 MED ORDER — POLYETHYLENE GLYCOL 3350 17 GM/SCOOP PO POWD
17.0000 g | Freq: Every day | ORAL | Status: DC
  Filled 2012-10-19: qty 255

## 2012-10-19 MED ORDER — ALUM & MAG HYDROXIDE-SIMETH 200-200-20 MG/5ML PO SUSP
30.0000 mL | Freq: Four times a day (QID) | ORAL | Status: DC | PRN
  Administered 2012-10-21: 30 mL via ORAL
  Filled 2012-10-19: qty 30

## 2012-10-19 MED ORDER — LACTATED RINGERS IV BOLUS (SEPSIS)
250.0000 mL | Freq: Once | INTRAVENOUS | Status: AC
  Administered 2012-10-19: 19:00:00 via INTRAVENOUS

## 2012-10-19 MED ORDER — MIDODRINE HCL 5 MG PO TABS
10.0000 mg | ORAL_TABLET | ORAL | Status: DC
  Administered 2012-10-21: 10 mg via ORAL
  Filled 2012-10-19 (×2): qty 2

## 2012-10-19 MED ORDER — BISACODYL 10 MG RE SUPP: 10.0000 mg | Freq: Every day | RECTAL | Status: DC | PRN

## 2012-10-19 MED ORDER — SALINE SPRAY 0.65 % NA SOLN
1.0000 | NASAL | Status: DC | PRN
  Filled 2012-10-19: qty 44

## 2012-10-19 MED ORDER — ALBUTEROL SULFATE (5 MG/ML) 0.5% IN NEBU
2.5000 mg | INHALATION_SOLUTION | RESPIRATORY_TRACT | Status: DC | PRN
  Administered 2012-10-19 – 2012-10-20 (×2): 2.5 mg via RESPIRATORY_TRACT
  Filled 2012-10-19 (×2): qty 0.5

## 2012-10-19 MED ORDER — SEVELAMER CARBONATE 800 MG PO TABS
3200.0000 mg | ORAL_TABLET | Freq: Three times a day (TID) | ORAL | Status: DC
  Administered 2012-10-20 – 2012-10-21 (×4): 3200 mg via ORAL
  Filled 2012-10-19 (×4): qty 4

## 2012-10-19 MED ORDER — ACETAMINOPHEN 500 MG PO TABS
1000.0000 mg | ORAL_TABLET | Freq: Four times a day (QID) | ORAL | Status: DC | PRN
  Administered 2012-10-20 (×2): 1000 mg via ORAL
  Filled 2012-10-19: qty 1
  Filled 2012-10-19: qty 2
  Filled 2012-10-19: qty 1

## 2012-10-19 MED ORDER — CINACALCET HCL 30 MG PO TABS
30.0000 mg | ORAL_TABLET | Freq: Every day | ORAL | Status: DC
  Administered 2012-10-20 – 2012-10-21 (×2): 30 mg via ORAL
  Filled 2012-10-19 (×4): qty 1

## 2012-10-19 MED ORDER — SEVELAMER CARBONATE 800 MG PO TABS
1600.0000 mg | ORAL_TABLET | Freq: Two times a day (BID) | ORAL | Status: DC
  Administered 2012-10-21: 1600 mg via ORAL
  Filled 2012-10-19: qty 2

## 2012-10-19 MED ORDER — SODIUM CHLORIDE 0.9 % IJ SOLN: 3.0000 mL | INTRAMUSCULAR | Status: DC | PRN

## 2012-10-19 MED ORDER — GABAPENTIN 100 MG PO CAPS
100.0000 mg | ORAL_CAPSULE | Freq: Two times a day (BID) | ORAL | Status: DC
  Administered 2012-10-20 – 2012-10-21 (×4): 100 mg via ORAL
  Filled 2012-10-19 (×4): qty 1

## 2012-10-19 NOTE — ED Notes (Signed)
Patient requesting something to eat - daughter has food for him when allowed to eat.  Patient states he has not eaten anything since breakfast this am

## 2012-10-19 NOTE — ED Notes (Addendum)
CRITICAL VALUE ALERT  Critical value received:  Venous ABG PH 7.484 PCO2 39.9 CO2 40.3 HCO3 29.7 Sat 75.7  Date of notification:  10/19/2012  Time of notification: 1835  Critical value read back: yes  Nurse who received alert: Garrison Columbus, RN  MD notified (1st page):  Dr. Rulon Abide  Time of first page:  1836  MD notified (2nd page):  Time of second page:  Responding MD: EMD  Time MD responded:  [phone]

## 2012-10-19 NOTE — ED Notes (Signed)
MD reports hemocult slide as Negative

## 2012-10-19 NOTE — ED Notes (Signed)
Report received from Eligah East, RN

## 2012-10-19 NOTE — ED Notes (Signed)
Pt sent here bu Home Health Nurse for eval of fever. Pt was given two tylenol by daughter around 1700. Pt was also found on floor by daughter around 1700. Pt states he sled off his bed and could not get back up. States he is suppose to use a cane but rarely does

## 2012-10-19 NOTE — ED Provider Notes (Signed)
History     CSN: [id number]  Arrival date & time 10/19/12  1801   First MD Initiated Contact with Patient 10/19/12 1806      Chief Complaint  Patient presents with  . Fever    (Consider location/radiation/quality/duration/timing/severity/associated sxs/prior treatment) HPI Andrew [name] is a 78 y.o. male with a history of peripheral vascular disease, ESRD since 2003. Patient had a CABG in 1998. Does also have a history of chronic anemia. Patient is a Monday Wednesday Friday dialysis patient, and is up-to-date on treatments. Over a week ago had a new dialysis graft placed in the left lower extremity in the groin, there is no pain or draining from that site. He is accompanied by his daughter who is concerned for possible intermittent fevers over the last couple days as well as intermittent confusion. Patient also fell tonight and was unable to call her because he could not figure out how to use his phone tonight this is very unlike him. Patient may have been down for about 2 hours.  Is unable to get back up typically ambulates with a cane or nothing at all. He currently has no complaints besides generalized weakness. He says this is been steady since his last discharge from the hospital, it is severe, it has not been associated with Chest pain, shortness of breath. He is uncertain if he is been having subjective fevers but does feel cold occasionally   Past Medical History  Diagnosis Date  . Secondary hyperparathyroidism   . Peripheral vascular disease   . Hyperlipidemia   . Duodenal ulcer     remote  . Gout STABLE  . ESRD (end stage renal disease)     Hemodialysis since 2003; Nephrologist-Dr. Caryn Section  . Arteriosclerotic cardiovascular disease (ASCVD) 1998    CABG-1998; h/o CHF; Atherosclerosis of great vessels, aorta and coronaries on CT in 02/2012  . Cerebrovascular disease   . Neuropathy of foot   . Degenerative joint disease   . Gastroesophageal reflux disease   . History of bladder  cancer   . Impaired hearing     Bilateral; hearing aids relatively ineffective  . Anemia     Prior blood transfusion  . Renal cell carcinoma 2001    s/p right nephrectomy-2001; subsequent ESRD  . Hypertension   . Cholelithiasis 03/2011    Diagnosed incidentally on abdominal ultrasound in 03/2011  . Hepatic steatosis     Past Surgical History  Procedure Date  . Right nephrectomy 2001    Renal cell carcinoma  . Colonoscopy 01/24/2011    prominent vascular pattern, suboptimal prep but doable  . Esophagogastroduodenoscopy 01/24/2011    mild erosive reflux esophagitis, bulbar/antral erosions, bx from antrum benign  . Av fistula repair 2008    revision of anastomosis of Right AVF  . Arteriovenous graft placement 2006    Thrombectomy and interposition jump graft revision to higher axillary vein of LUA AVG  . Coronary artery bypass graft 1998    X4 VESSELS  . Multiple cysto/ resection tumor's with bx's LAST ONE 05-09-2011  . Multiple surg's / interventions for avgg/ fistula right upper arm LAST REVISION 06-29-2011    (JUNE 2010 STENT CEPHALIC VEIN/ 03-23-2011 DILATATION ANGIOPLASTY)  . Left ptc left renal artery   . Cardiac catheterization 2004  . Cataract extraction w/ intraocular lens  implant, bilateral   . Cystoscopy w/ retrogrades 12/28/2011    Procedure: CYSTOSCOPY WITH RETROGRADE PYELOGRAM;  Surgeon: [name], MD;  Location: [facility];  Service:  Urology;  Laterality: Left;  C-ARM   . Transurethral resection of bladder tumor 12/28/2011    Procedure: TRANSURETHRAL RESECTION OF BLADDER TUMOR (TURBT);  Surgeon: [name], MD;  Location: [facility];  Service: Urology;  Laterality: N/A;  . Av fistula placement 02/29/2012    Procedure: INSERTION OF ARTERIOVENOUS (AV) GORE-TEX GRAFT THIGH;  Surgeon: [name], MD;  Location: [facility] OR;  Service: Vascular;  Laterality: Right;  Insertion right femoral arteriovenous gortex graft  . Ligation of right  braciocephalic av fistula 04-04-2012  . Insertion of dialysis catheter 06/05/2012    Procedure: INSERTION OF DIALYSIS CATHETER;  Surgeon: Pryor Ochoa, MD;  Location: [facility] OR;  Service: Vascular;  Laterality: Left;  Insertion diatek catheter left IJ  . Insertion of dialysis catheter 08/22/2012    Procedure: INSERTION OF DIALYSIS CATHETER;  Surgeon: Nada Libman, MD;  Location: [facility];  Service: Vascular;  Laterality: Left;  insertion of dialysis catheter left  internal jugular 27cm  . Av fistula placement 10/08/2012    Procedure: INSERTION OF ARTERIOVENOUS (AV) GORE-TEX GRAFT THIGH;  Surgeon: Chuck Hint, MD;  Location: [facility] OR;  Service: Vascular;  Laterality: Left;    Family History  Problem Relation Age of Onset  . Colon cancer Neg Hx   . Liver disease Neg Hx   . Anesthesia problems Neg Hx   . Heart disease Father     History  Substance Use Topics  . Smoking status: Former Smoker -- 1.0 packs/day for 25 years    Types: Cigarettes    Quit date: 02/26/1984  . Smokeless tobacco: Never Used     Comment: quit 1985  . Alcohol Use: No      Review of Systems At least 10pt or greater review of systems completed and are negative except where specified in the HPI.  Allergies  Review of patient's allergies indicates no known allergies.  Home Medications   Current Outpatient Rx  Name  Route  Sig  Dispense  Refill  . ACETAMINOPHEN 500 MG PO TABS   Oral   Take 1,000 mg by mouth every 6 (six) hours as needed. For pain/headache         . ALLOPURINOL 100 MG PO TABS   Oral   Take 100 mg by mouth at bedtime.          . ASPIRIN EC 81 MG PO TBEC   Oral   Take 81 mg by mouth at bedtime.          . ATORVASTATIN CALCIUM 10 MG PO TABS   Oral   Take 10 mg by mouth every morning.          Marland Kitchen DIALYVITE PO TABS   Oral   Take 1 tablet by mouth at bedtime.          Marland Kitchen CALCIUM CARBONATE ANTACID 500 MG PO CHEW   Oral   Chew 1 tablet by mouth daily as needed. For  upset stomach         . CINACALCET HCL 30 MG PO TABS   Oral   Take 30 mg by mouth daily with breakfast.          . COLCHICINE 0.6 MG PO TABS   Oral   Take 0.6 mg by mouth 2 (two) times daily as needed. for gout         . GABAPENTIN 100 MG PO CAPS   Oral   Take 100 mg by mouth 2 (two)  times daily.          Marland Kitchen MIDODRINE HCL 10 MG PO TABS   Oral   Take 1 tablet (10 mg total) by mouth every Monday, Wednesday, and Friday with hemodialysis.   30 tablet   3   . OMEPRAZOLE 40 MG PO CPDR   Oral   Take 40 mg by mouth at bedtime. For reflux         . POLYETHYLENE GLYCOL 3350 PO POWD   Oral   Take 17 g by mouth at bedtime.          Marland Kitchen SEVELAMER CARBONATE 800 MG PO TABS   Oral   Take 1,600-3,200 mg by mouth 5 (five) times daily. 4 tabs 3 x daily and 2 with snack           BP 114/40  Pulse 102  Temp 99 F (37.2 C) (Oral)  Resp 20  Wt 167 lb (75.751 kg)  SpO2 96%  Physical Exam  Nursing notes reviewed.  Electronic medical record reviewed. VITAL SIGNS:   Filed Vitals:   10/19/12 1804 10/19/12 2012  BP: 114/40   Pulse: 102   Temp: 99 F (37.2 C) 99.6 F (37.6 C)  TempSrc: Oral Rectal  Resp: 20   Weight: 167 lb (75.751 kg)   SpO2: 96%    CONSTITUTIONAL: Awake, oriented x4, appears non-toxic, somnolent HENT: Atraumatic, normocephalic, oral mucosa pink and moist, airway patent. Nares patent without drainage. External ears normal. EYES: Conjunctiva clear, EOMI, PERRLA. Right eye ptosis, chronic NECK: Trachea midline, non-tender, supple CARDIOVASCULAR: Normal heart rate, Normal rhythm, No murmurs, rubs, gallops. Well-healed median sternotomy scar. PULMONARY/CHEST: Clear to auscultation, no rhonchi, wheezes, or rales. Symmetrical breath sounds. Non-tender. ABDOMINAL: Non-distended, soft, non-tender - no rebound or guarding.  BS normal. NEUROLOGIC: Non-focal, moving all four extremities, no gross sensory or motor deficits. EXTREMITIES: No clubbing, cyanosis, or  edema. Old fistulas right upper and left upper extremities. Failed fistula right lower extremity. New fistula placed left lower extremity and thigh, surgical incision is clean dry and intact with good pulsations. SKIN: Warm, Dry, No erythema, No rash  ED Course  Procedures (including critical care time)  Labs Reviewed  URINALYSIS, ROUTINE W REFLEX MICROSCOPIC - Abnormal; Notable for the following:    pH 8.5 (*)     Hgb urine dipstick TRACE (*)     Protein, ur 30 (*)     All other components within normal limits  CBC WITH DIFFERENTIAL - Abnormal; Notable for the following:    RBC 2.05 (*)     Hemoglobin 7.3 (*)     HCT 22.7 (*)     MCV 110.7 (*)     MCH 35.6 (*)     RDW 21.1 (*)     Platelets 145 (*)     Lymphs Abs 0.6 (*)     All other components within normal limits  COMPREHENSIVE METABOLIC PANEL - Abnormal; Notable for the following:    Chloride 94 (*)     Glucose, Bld 112 (*)     BUN 40 (*)     Creatinine, Ser 6.63 (*)     Albumin 3.0 (*)     Alkaline Phosphatase 160 (*)     GFR calc non Af Amer 7 (*)     GFR calc Af Amer 8 (*)     All other components within normal limits  BLOOD GAS, VENOUS - Abnormal; Notable for the following:    pH, Ven 7.484 (*)  pCO2, Ven 39.9 (*)     Bicarbonate 29.7 (*)     Acid-Base Excess 6.1 (*)     All other components within normal limits  LACTIC ACID, PLASMA  CULTURE, BLOOD (ROUTINE X 2)  CULTURE, BLOOD (ROUTINE X 2)  CK  TYPE AND SCREEN  URINE MICROSCOPIC-ADD ON   Dg Chest 2 View  10/19/2012  *RADIOLOGY REPORT*  Clinical Data: 78 year old male weakness, dialysis patient.  Fever.  CHEST - 2 VIEW  Comparison: 10/07/2012 and earlier.  Findings: Upright AP and lateral views of the chest.  Stable left chest tunneled dual lumen dialysis type catheter.  Stable sequelae of CABG.  Slightly lower lung volumes.  Stable cardiac size and mediastinal contours.  No pneumothorax, pulmonary edema, pleural effusion or acute pulmonary opacity.  Right  subclavian stent. Postoperative changes to the left axilla.  IMPRESSION: No acute cardiopulmonary abnormality.   Original Report Authenticated By: Erskine Speed, M.D.      No diagnosis found.    MDM  Andrew [name] is a 78 y.o. 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 Recent fistula placement in the left lower extremity. To have procedure done about a week ago could be having some persistent delirium from anesthesia, hospitalization etc. Patient is weak, lives by himself did fall this evening. Laboratory studies do show the patient has worsening anemia with a hemoglobin of 7.3, patient has no elevated white count. Is not acidotic and has a normal lactate. Drawn blood cultures but not started antibiotics at this time. Patient's occult blood from rectal exam was negative. Patient was given 250cc fluid bolus.  Anemia may be secondary to worsening chronic kidney disease, delirium and weakness may be secondary to anemia and also recent procedure and hemodialysis.  Will admit patient to the hospital for observation. Type and screen have been performed for potential blood transfusion as needed determined by hospital team.  Discussed with Dr. Phillips Odor for admission     Jones Skene, MD 10/19/12 2243

## 2012-10-19 NOTE — ED Notes (Addendum)
Daughter reports that the graft in right arm no longer functions and has not been used for more than 7 months.  Will use this extremity to monitor BP since no other extremity is available for various reasons. (has had procedures performed on lower extremeties. Has graft in left arm - not used and also is not functioning.

## 2012-10-20 ENCOUNTER — Encounter (HOSPITAL_COMMUNITY): Payer: Self-pay | Admitting: Internal Medicine

## 2012-10-20 DIAGNOSIS — E875 Hyperkalemia: Secondary | ICD-10-CM | POA: Diagnosis not present

## 2012-10-20 DIAGNOSIS — D649 Anemia, unspecified: Secondary | ICD-10-CM

## 2012-10-20 DIAGNOSIS — R404 Transient alteration of awareness: Secondary | ICD-10-CM

## 2012-10-20 DIAGNOSIS — D61818 Other pancytopenia: Secondary | ICD-10-CM | POA: Diagnosis present

## 2012-10-20 DIAGNOSIS — R5383 Other fatigue: Secondary | ICD-10-CM

## 2012-10-20 DIAGNOSIS — D7589 Other specified diseases of blood and blood-forming organs: Secondary | ICD-10-CM

## 2012-10-20 DIAGNOSIS — D696 Thrombocytopenia, unspecified: Secondary | ICD-10-CM

## 2012-10-20 HISTORY — DX: Other specified diseases of blood and blood-forming organs: D75.89

## 2012-10-20 HISTORY — DX: Thrombocytopenia, unspecified: D69.6

## 2012-10-20 LAB — BASIC METABOLIC PANEL
CO2: 30 mEq/L (ref 19–32)
Calcium: 9.7 mg/dL (ref 8.4–10.5)
Chloride: 92 mEq/L — ABNORMAL LOW (ref 96–112)
Creatinine, Ser: 7.88 mg/dL — ABNORMAL HIGH (ref 0.50–1.35)
GFR calc Af Amer: 6 mL/min — ABNORMAL LOW (ref >90)
Sodium: 134 mEq/L — ABNORMAL LOW (ref 135–145)

## 2012-10-20 LAB — PREPARE RBC (CROSSMATCH)

## 2012-10-20 LAB — CBC
MCH: 35.6 pg — ABNORMAL HIGH (ref 26.0–34.0)
MCV: 110.7 fL — ABNORMAL HIGH (ref 78.0–100.0)
Platelets: 129 10*3/uL — ABNORMAL LOW (ref 150–400)
RBC: 2.05 MIL/uL — ABNORMAL LOW (ref 4.22–5.81)
RDW: 21.7 % — ABNORMAL HIGH (ref 11.5–15.5)
WBC: 4 10*3/uL (ref 4.0–10.5)

## 2012-10-20 LAB — IRON AND TIBC
Iron: 40 ug/dL — ABNORMAL LOW (ref 42–135)
UIBC: 207 ug/dL (ref 125–400)

## 2012-10-20 LAB — T4, FREE: Free T4: 0.89 ng/dL (ref 0.80–1.80)

## 2012-10-20 LAB — TSH: TSH: 1.213 u[IU]/mL (ref 0.350–4.500)

## 2012-10-20 LAB — RETICULOCYTES
RBC.: 2.05 MIL/uL — ABNORMAL LOW (ref 4.22–5.81)
Retic Count, Absolute: 41 10*3/uL (ref 19.0–186.0)

## 2012-10-20 LAB — FERRITIN: Ferritin: 942 ng/mL — ABNORMAL HIGH (ref 22–322)

## 2012-10-20 LAB — ABO/RH: ABO/RH(D): O POS

## 2012-10-20 MED ORDER — SODIUM POLYSTYRENE SULFONATE 15 GM/60ML PO SUSP
30.0000 g | Freq: Once | ORAL | Status: AC
  Administered 2012-10-20: 30 g via ORAL
  Filled 2012-10-20: qty 60

## 2012-10-20 MED ORDER — POLYETHYLENE GLYCOL 3350 17 G PO PACK
17.0000 g | PACK | Freq: Every day | ORAL | Status: DC
  Administered 2012-10-20: 17 g via ORAL
  Filled 2012-10-20: qty 1

## 2012-10-20 NOTE — Progress Notes (Signed)
Subjective: The patient is asleep as I enter room, but he becomes alert and oriented upon waking. He has no complaints of confusion, chest pain, or shortness of breath, but he does acknowledge generalized weakness.  Objective: Vital signs in last 24 hours: Filed Vitals:   10/19/12 2359 10/20/12 0536 10/20/12 0610 10/20/12 0700  BP:  123/83    Pulse:  101    Temp:  102.4 F (39.1 C)  98.6 F (37 C)  TempSrc:  Oral  Oral  Resp:  18    Height:      Weight:  77 kg (169 lb 12.1 oz)    SpO2: 96% 93% 92%     Intake/Output Summary (Last 24 hours) at 10/20/12 1043 Last data filed at 10/20/12 0800  Gross per 24 hour  Intake    543 ml  Output      0 ml  Net    543 ml    Weight change:   Physical exam: General: Pleasant 78 year old Caucasian man, in no acute distress. Lungs: Rare crackles in the bases, otherwise clear. Heart: S1, S2, with a 2-3/6 systolic murmur. Abdomen: Positive bowel sounds, soft, nontender, nondistended. Extremities/musculoskeletal: No pedal edema. No acute hot red joints. He moves his extremities x4 spontaneously and without difficulty. Neurologic: He becomes alert. He is oriented to date, month, year, and Economist. Cranial nerves II through XII are grossly intact.  Lab Results: Basic Metabolic Panel:  Basename 10/20/12 0516 10/19/12 1827  NA 134* 135  K 5.9* 4.9  CL 92* 94*  CO2 30 28  GLUCOSE 94 112*  BUN 49* 40*  CREATININE 7.88* 6.63*  CALCIUM 9.7 9.3  MG -- --  PHOS -- --   Liver Function Tests:  Basename 10/19/12 1827  AST 25  ALT 16  ALKPHOS 160*  BILITOT 0.3  PROT 6.3  ALBUMIN 3.0*   No results found for this basename: LIPASE:2,AMYLASE:2 in the last 72 hours No results found for this basename: AMMONIA:2 in the last 72 hours CBC:  Basename 10/20/12 0516 10/19/12 1827  WBC 4.0 4.7  NEUTROABS -- 3.6  HGB 7.3* 7.3*  HCT 22.7* 22.7*  MCV 110.7* 110.7*  PLT 129* 145*   Cardiac Enzymes:  Basename 10/19/12 1827  CKTOTAL 58  CKMB  --  CKMBINDEX --  TROPONINI --   BNP: No results found for this basename: PROBNP:3 in the last 72 hours D-Dimer: No results found for this basename: DDIMER:2 in the last 72 hours CBG: No results found for this basename: GLUCAP:6 in the last 72 hours Hemoglobin A1C: No results found for this basename: HGBA1C in the last 72 hours Fasting Lipid Panel: No results found for this basename: CHOL,HDL,LDLCALC,TRIG,CHOLHDL,LDLDIRECT in the last 72 hours Thyroid Function Tests: No results found for this basename: TSH,T4TOTAL,FREET4,T3FREE,THYROIDAB in the last 72 hours Anemia Panel:  Basename 10/20/12 0516  VITAMINB12 --  FOLATE --  FERRITIN --  TIBC --  IRON --  RETICCTPCT 2.0   Coagulation: No results found for this basename: LABPROT:2,INR:2 in the last 72 hours Urine Drug Screen: Drugs of Abuse  No results found for this basename: labopia, cocainscrnur, labbenz, amphetmu, thcu, labbarb    Alcohol Level: No results found for this basename: ETH:2 in the last 72 hours Urinalysis:  Basename 10/19/12 2010  COLORURINE YELLOW  LABSPEC 1.015  PHURINE 8.5*  GLUCOSEU NEGATIVE  HGBUR TRACE*  BILIRUBINUR NEGATIVE  KETONESUR NEGATIVE  PROTEINUR 30*  UROBILINOGEN 0.2  NITRITE NEGATIVE  LEUKOCYTESUR NEGATIVE   Misc. Labs:  Micro: Recent Results (from the past 240 hour(s))  CULTURE, BLOOD (ROUTINE X 2)     Status: Normal (Preliminary result)   Collection Time   10/19/12  6:17 PM      Component Value Range Status Comment   Specimen Description BLOOD LEFT ARM   Final    Special Requests BOTTLES DRAWN AEROBIC AND ANAEROBIC 6CC   Final    Culture NO GROWTH 1 DAY   Final    Report Status PENDING   Incomplete   CULTURE, BLOOD (ROUTINE X 2)     Status: Normal (Preliminary result)   Collection Time   10/19/12  6:29 PM      Component Value Range Status Comment   Specimen Description BLOOD LEFT ARM   Final    Special Requests BOTTLES DRAWN AEROBIC AND ANAEROBIC 6CC   Final     Culture NO GROWTH 1 DAY   Final    Report Status PENDING   Incomplete     Studies/Results: Dg Chest 2 View  10/19/2012  *RADIOLOGY REPORT*  Clinical Data: 78 year old male weakness, dialysis patient.  Fever.  CHEST - 2 VIEW  Comparison: 10/07/2012 and earlier.  Findings: Upright AP and lateral views of the chest.  Stable left chest tunneled dual lumen dialysis type catheter.  Stable sequelae of CABG.  Slightly lower lung volumes.  Stable cardiac size and mediastinal contours.  No pneumothorax, pulmonary edema, pleural effusion or acute pulmonary opacity.  Right subclavian stent. Postoperative changes to the left axilla.  IMPRESSION: No acute cardiopulmonary abnormality.   Original Report Authenticated By: Erskine Speed, M.D.     Medications:  Scheduled:   . allopurinol  100 mg Oral QHS  . aspirin EC  81 mg Oral QHS  . atorvastatin  10 mg Oral q morning - 10a  . b complex-vitamin c-folic acid  1 tablet Oral QHS  . cinacalcet  30 mg Oral Q breakfast  . docusate sodium  100 mg Oral BID  . gabapentin  100 mg Oral BID  . [COMPLETED] lactated ringers  250 mL Intravenous Once  . midodrine  10 mg Oral Q M,W,F-HD  . pantoprazole  40 mg Oral Daily  . polyethylene glycol  17 g Oral QHS  . sevelamer  1,600 mg Oral BID BM  . sevelamer  3,200 mg Oral TID WC  . [COMPLETED] sodium polystyrene  30 g Oral Once  . [DISCONTINUED] polyethylene glycol powder  17 g Oral QHS  . [DISCONTINUED] sodium chloride  3 mL Intravenous Q12H   Continuous:  ZOX:[id number], albuterol, alum & mag hydroxide-simeth, bisacodyl, colchicine, HYDROcodone-acetaminophen, sodium chloride, [DISCONTINUED] sodium chloride, [DISCONTINUED] sodium chloride  Assessment: Principal Problem:  *ANEMIA Active Problems:  Weakness generalized  ESRD (end stage renal disease) on dialysis  Hyperkalemia  Hypertension  Hyperlipidemia  ESRD (end stage renal disease)  Peripheral vascular disease  CAD (coronary artery disease)  Delirium   Fall  Constipation  Anorexia  Macrocytosis  Thrombocytopenia    1. Generalized weakness, secondary to symptomatic anemia. Apparently his baseline hemoglobin is approximately 9 g. It was 7.3 on admission. One stool was Hemoccult negative. It is likely he has anemia of renal disease, but a GI source cannot be completely ruled out. Per colonoscopy in 2012, he had a prominent vascular pattern and per EGD in 2012, there was evidence of erosive esophagitis and antral erosions. He will be transfused during dialysis. We'll continue proton pump inhibitor therapy.  Reported anorexia. The patient said that he wanted to eat breakfast  before dialysis. No complaints of abdominal pain.  Reported delirium. Apparently this is resolving or has resolved. He is alert and oriented. His speech is appropriate.  Macrocytosis. Anemia panel has been ordered. Results pending. End-stage renal disease. Dr. Kristian Covey has been consulted. The patient will undergo dialysis today.  Hyperkalemia secondary to end-stage renal disease. This will be corrected with dialysis. However, because of his recent history of constipation, Kayexalate was ordered.  CAD/hypertension/hyperlipidemia. All appear to be stable. Continue chronic medications.  Constipation. Continue MiraLax as ordered.  Plan:  1. Hemodialysis today. 2 units of packed red blood cell transfusions with dialysis today. 2. One dose of Kayexalate. 3. Continue supportive treatment. 4. PT consultation in the morning. 5. Check the results of anemia panel pending. We'll check a TSH.    LOS: 1 day   Toan Mort 10/20/2012, 10:43 AM

## 2012-10-20 NOTE — Consult Note (Signed)
Reason for Consult: End-stage renal disease Referring Physician: Dr. [name] is an 78 y.o. male.  HPI: He is a patient with  past medical history of coronary artery disease, hypertension and end-stage renal disease on maintenance hemodialysis presently admitted to the hospital because of her anemia and the weakness. Presently patient denies any difficulty breathing. He denies also any chest pain. His last dialysis was on Friday.   Past Medical History  Diagnosis Date  . Secondary hyperparathyroidism   . Peripheral vascular disease   . Hyperlipidemia   . Duodenal ulcer     remote  . Gout STABLE  . ESRD (end stage renal disease)     Hemodialysis since 2003; Nephrologist-Dr. Caryn [name]  . Arteriosclerotic cardiovascular disease (ASCVD) 1998    CABG-1998; h/o CHF; Atherosclerosis of great vessels, aorta and coronaries on CT in 02/2012  . Cerebrovascular disease   . Neuropathy of foot   . Degenerative joint disease   . Gastroesophageal reflux disease   . History of bladder cancer   . Impaired hearing     Bilateral; hearing aids relatively ineffective  . Anemia     Prior blood transfusion  . Renal cell carcinoma 2001    s/p right nephrectomy-2001; subsequent ESRD  . Hypertension   . Cholelithiasis 03/2011    Diagnosed incidentally on abdominal ultrasound in 03/2011  . Hepatic steatosis   . ESRD (end stage renal disease) on dialysis 08/20/2012  . Macrocytosis 10/20/2012  . Thrombocytopenia 10/20/2012    Past Surgical History  Procedure Date  . Right nephrectomy 2001    Renal cell carcinoma  . Colonoscopy 01/24/2011    prominent vascular pattern, suboptimal prep but doable  . Esophagogastroduodenoscopy 01/24/2011    mild erosive reflux esophagitis, bulbar/antral erosions, bx from antrum benign  . Av fistula repair 2008    revision of anastomosis of Right AVF  . Arteriovenous graft placement 2006    Thrombectomy and interposition jump graft revision to higher axillary vein  of LUA AVG  . Coronary artery bypass graft 1998    X4 VESSELS  . Multiple cysto/ resection tumor's with bx's LAST ONE 05-09-2011  . Multiple surg's / interventions for avgg/ fistula right upper arm LAST REVISION 06-29-2011    (JUNE 2010 STENT CEPHALIC VEIN/ 03-23-2011 DILATATION ANGIOPLASTY)  . Left ptc left renal artery   . Cardiac catheterization 2004  . Cataract extraction w/ intraocular lens  implant, bilateral   . Cystoscopy w/ retrogrades 12/28/2011    Procedure: CYSTOSCOPY WITH RETROGRADE PYELOGRAM;  Surgeon: [name], MD;  Location: [facility];  Service: Urology;  Laterality: Left;  C-ARM   . Transurethral resection of bladder tumor 12/28/2011    Procedure: TRANSURETHRAL RESECTION OF BLADDER TUMOR (TURBT);  Surgeon: [name], MD;  Location: [facility];  Service: Urology;  Laterality: N/A;  . Av fistula placement 02/29/2012    Procedure: INSERTION OF ARTERIOVENOUS (AV) GORE-TEX GRAFT THIGH;  Surgeon: [name], MD;  Location: [facility] OR;  Service: Vascular;  Laterality: Right;  Insertion right femoral arteriovenous gortex graft  . Ligation of right braciocephalic av fistula 04-04-2012  . Insertion of dialysis catheter 06/05/2012    Procedure: INSERTION OF DIALYSIS CATHETER;  Surgeon: Pryor Ochoa, MD;  Location: [facility] OR;  Service: Vascular;  Laterality: Left;  Insertion diatek catheter left IJ  . Insertion of dialysis catheter 08/22/2012    Procedure: INSERTION OF DIALYSIS CATHETER;  Surgeon: Nada Libman, MD;  Location: [facility];  Service: Vascular;  Laterality: Left;  insertion of dialysis catheter left  internal jugular 27cm  . Av fistula placement 10/08/2012    Procedure: INSERTION OF ARTERIOVENOUS (AV) GORE-TEX GRAFT THIGH;  Surgeon: Chuck Hint, MD;  Location: [facility] OR;  Service: Vascular;  Laterality: Left;    Family History  Problem Relation Age of Onset  . Colon cancer Neg Hx   . Liver disease Neg Hx   . Anesthesia problems  Neg Hx   . Heart disease Father     Social History:  reports that he quit smoking about 28 years ago. His smoking use included Cigarettes. He has a 25 pack-year smoking history. He has quit using smokeless tobacco. He reports that he does not drink alcohol or use illicit drugs.  Allergies: No Known Allergies  Medications: I have reviewed the patient's current medications.  Results for orders placed during the hospital encounter of 10/19/12 (from the past 48 hour(s))  LACTIC ACID, PLASMA     Status: Normal   Collection Time   10/19/12  6:12 PM      Component Value Range Comment   Lactic Acid, Venous 1.8  0.5 - 2.2 mmol/L   CULTURE, BLOOD (ROUTINE X 2)     Status: Normal (Preliminary result)   Collection Time   10/19/12  6:17 PM      Component Value Range Comment   Specimen Description BLOOD LEFT ARM      Special Requests BOTTLES DRAWN AEROBIC AND ANAEROBIC 6CC      Culture PENDING      Report Status PENDING     BLOOD GAS, VENOUS     Status: Abnormal   Collection Time   10/19/12  6:20 PM      Component Value Range Comment   FIO2 21.00      pH, Ven 7.484 (*) 7.250 - 7.300    pCO2, Ven 39.9 (*) 45.0 - 50.0 mmHg    pO2, Ven 40.3  30.0 - 45.0 mmHg    Bicarbonate 29.7 (*) 20.0 - 24.0 mEq/L    TCO2 28.3  0 - 100 mmol/L    Acid-Base Excess 6.1 (*) 0.0 - 2.0 mmol/L    O2 Saturation 75.7      Patient temperature 37.0      Collection site VEIN      Drawn by     DRAWN BY LAB   Value: CRITICAL RESULT CALLED TO, READ BACK BY AND VERIFIED WITH:   Sample type VENOUS     CBC WITH DIFFERENTIAL     Status: Abnormal   Collection Time   10/19/12  6:27 PM      Component Value Range Comment   WBC 4.7  4.0 - 10.5 K/uL    RBC 2.05 (*) 4.22 - 5.81 MIL/uL    Hemoglobin 7.3 (*) 13.0 - 17.0 g/dL    HCT 16.1 (*) 09.6 - 52.0 %    MCV 110.7 (*) 78.0 - 100.0 fL    MCH 35.6 (*) 26.0 - 34.0 pg    MCHC 32.2  30.0 - 36.0 g/dL    RDW 04.5 (*) 40.9 - 15.5 %    Platelets 145 (*) 150 - 400 K/uL     Neutrophils Relative 76  43 - 77 %    Neutro Abs 3.6  1.7 - 7.7 K/uL    Lymphocytes Relative 13  12 - 46 %    Lymphs Abs 0.6 (*) 0.7 - 4.0 K/uL    Monocytes Relative 9  3 - 12 %    Monocytes Absolute 0.4  0.1 - 1.0 K/uL    Eosinophils Relative 2  0 - 5 %    Eosinophils Absolute 0.1  0.0 - 0.7 K/uL    Basophils Relative 0  0 - 1 %    Basophils Absolute 0.0  0.0 - 0.1 K/uL   COMPREHENSIVE METABOLIC PANEL     Status: Abnormal   Collection Time   10/19/12  6:27 PM      Component Value Range Comment   Sodium 135  135 - 145 mEq/L    Potassium 4.9  3.5 - 5.1 mEq/L    Chloride 94 (*) 96 - 112 mEq/L    CO2 28  19 - 32 mEq/L    Glucose, Bld 112 (*) 70 - 99 mg/dL    BUN 40 (*) 6 - 23 mg/dL    Creatinine, Ser 6.64 (*) 0.50 - 1.35 mg/dL    Calcium 9.3  8.4 - 40.3 mg/dL    Total Protein 6.3  6.0 - 8.3 g/dL    Albumin 3.0 (*) 3.5 - 5.2 g/dL    AST 25  0 - 37 U/L    ALT 16  0 - 53 U/L    Alkaline Phosphatase 160 (*) 39 - 117 U/L    Total Bilirubin 0.3  0.3 - 1.2 mg/dL    GFR calc non Af Amer 7 (*) >90 mL/min    GFR calc Af Amer 8 (*) >90 mL/min   CK     Status: Normal   Collection Time   10/19/12  6:27 PM      Component Value Range Comment   Total CK 58  7 - 232 U/L   CULTURE, BLOOD (ROUTINE X 2)     Status: Normal (Preliminary result)   Collection Time   10/19/12  6:29 PM      Component Value Range Comment   Specimen Description BLOOD LEFT ARM      Special Requests BOTTLES DRAWN AEROBIC AND ANAEROBIC 6CC      Culture PENDING      Report Status PENDING     URINALYSIS, ROUTINE W REFLEX MICROSCOPIC     Status: Abnormal   Collection Time   10/19/12  8:10 PM      Component Value Range Comment   Color, Urine YELLOW  YELLOW    APPearance CLEAR  CLEAR    Specific Gravity, Urine 1.015  1.005 - 1.030    pH 8.5 (*) 5.0 - 8.0    Glucose, UA NEGATIVE  NEGATIVE mg/dL    Hgb urine dipstick TRACE (*) NEGATIVE    Bilirubin Urine NEGATIVE  NEGATIVE    Ketones, ur NEGATIVE  NEGATIVE mg/dL     Protein, ur 30 (*) NEGATIVE mg/dL    Urobilinogen, UA 0.2  0.0 - 1.0 mg/dL    Nitrite NEGATIVE  NEGATIVE    Leukocytes, UA NEGATIVE  NEGATIVE   URINE MICROSCOPIC-ADD ON     Status: Normal   Collection Time   10/19/12  8:10 PM      Component Value Range Comment   RBC / HPF 3-6  <3 RBC/hpf   TYPE AND SCREEN     Status: Normal (Preliminary result)   Collection Time   10/19/12  8:15 PM      Component Value Range Comment   ABO/RH(D) O POS      Antibody Screen NEG      Sample Expiration 10/22/2012      Unit  Number [id number]      Blood Component Type RED CELLS,LR      Unit division 00      Status of Unit ALLOCATED      Transfusion Status OK TO TRANSFUSE      Crossmatch Result Compatible     PREPARE RBC (CROSSMATCH)     Status: Normal   Collection Time   10/19/12  8:15 PM      Component Value Range Comment   Order Confirmation ORDER PROCESSED BY BLOOD BANK     ABO/RH     Status: Normal   Collection Time   10/19/12  8:15 PM      Component Value Range Comment   ABO/RH(D) O POS     CBC     Status: Abnormal   Collection Time   10/20/12  5:16 AM      Component Value Range Comment   WBC 4.0  4.0 - 10.5 K/uL    RBC 2.05 (*) 4.22 - 5.81 MIL/uL    Hemoglobin 7.3 (*) 13.0 - 17.0 g/dL    HCT 11.9 (*) 14.7 - 52.0 %    MCV 110.7 (*) 78.0 - 100.0 fL    MCH 35.6 (*) 26.0 - 34.0 pg    MCHC 32.2  30.0 - 36.0 g/dL    RDW 82.9 (*) 56.2 - 15.5 %    Platelets 129 (*) 150 - 400 K/uL   BASIC METABOLIC PANEL     Status: Abnormal   Collection Time   10/20/12  5:16 AM      Component Value Range Comment   Sodium 134 (*) 135 - 145 mEq/L    Potassium 5.9 (*) 3.5 - 5.1 mEq/L DELTA CHECK NOTED   Chloride 92 (*) 96 - 112 mEq/L    CO2 30  19 - 32 mEq/L    Glucose, Bld 94  70 - 99 mg/dL    BUN 49 (*) 6 - 23 mg/dL    Creatinine, Ser 1.30 (*) 0.50 - 1.35 mg/dL    Calcium 9.7  8.4 - 86.5 mg/dL    GFR calc non Af Amer 6 (*) >90 mL/min    GFR calc Af Amer 6 (*) >90 mL/min   RETICULOCYTES     Status:  Abnormal   Collection Time   10/20/12  5:16 AM      Component Value Range Comment   Retic Ct Pct 2.0  0.4 - 3.1 %    RBC. 2.05 (*) 4.22 - 5.81 MIL/uL    Retic Count, Manual 41.0  19.0 - 186.0 K/uL     Dg Chest 2 View  10/19/2012  *RADIOLOGY REPORT*  Clinical Data: 78 year old male weakness, dialysis patient.  Fever.  CHEST - 2 VIEW  Comparison: 10/07/2012 and earlier.  Findings: Upright AP and lateral views of the chest.  Stable left chest tunneled dual lumen dialysis type catheter.  Stable sequelae of CABG.  Slightly lower lung volumes.  Stable cardiac size and mediastinal contours.  No pneumothorax, pulmonary edema, pleural effusion or acute pulmonary opacity.  Right subclavian stent. Postoperative changes to the left axilla.  IMPRESSION: No acute cardiopulmonary abnormality.   Original Report Authenticated By: Erskine Speed, M.D.     Review of Systems  Constitutional: Positive for chills. Negative for fever.  Respiratory: Negative for cough and shortness of breath.   Cardiovascular: Negative for chest pain, orthopnea and leg swelling.  Gastrointestinal: Positive for nausea and constipation. Negative for heartburn, vomiting and abdominal pain.  Neurological: Positive for  weakness.   Blood pressure 123/83, pulse 101, temperature 98.6 F (37 C), temperature source Oral, resp. rate 18, height 5\' 8"  (1.727 m), weight 77 kg (169 lb 12.1 oz), SpO2 92.00%. Physical Exam  Eyes: No scleral icterus.  Neck: Neck supple. No JVD present.  Cardiovascular: Normal rate and regular rhythm.  Exam reveals no friction rub.   No murmur heard. Respiratory: He is in respiratory distress. He has no wheezes. He has no rales.       Patient with left IJ catheter.  GI: Bowel sounds are normal. He exhibits no distension. There is no tenderness. There is no rebound.  Musculoskeletal: He exhibits no edema.       Patient has a left femoral graft placed. At this moment there is no tenderness, erythema or drainage. It  has a good bruit.    Assessment/Plan:  problem #1 end-stage renal disease he status post hemodialysis on Friday his pending creatinine was in acceptable range. Patient has some nausea but no vomiting. Problem #2 hyperkalemia potassium 5.9 Problem #3 anemia his hemoglobin and hematocrit is low. Presently patient denies any history of bleeding. Problem #4 history of coronary artery disease is status post CABG he is a symptomatic.   problem #5 hypertension his blood pressure seems to be controlled very well Problem #6 history of CVA Problem #7 history of renal CA status post right nephrectomy. Plan: We'll try to do dialysis today and use 2K 2.5 calcium bath. We'll transfuse patient during dialysis. We'll check his basic metabolic panel, CBC and phosphorus in the morning.   Andrew [name] S 10/20/2012, 8:11 AM

## 2012-10-20 NOTE — H&P (Signed)
[facility] History and Physical  Andrew [name] AOZ:[id number] DOB: 1926/04/03 DOA: 10/19/2012  Referring physician: Ferne Coe PCP: [name]., MD  Specialists: Nephrology  Chief Complaint: Severe Weakness  HPI: Andrew [name] is a 78 y.o. male with HTN, CABG, bladder cancer and renal cell carcinoma s/p R nephrectomy in 2000 and ESRD on HD since 2003. He presents to teh ED c/o of severe wekness, he was at home and slipped off the side of his bed on to the floor but did not have the strength to get up off the floor, he is usually highly functional, drives himself to hemodialysis, and lives independently. He has 3 daughters who check on him frequently and assist him with his care. His daughter at the bedside reports that he has been more confused lately, worse in the evenings for the past few days. He has intermittent confusion and delirium during hospitalizations and after anesthesia which he had a a left thigh graft revision on 11/13. He has HD MWF and has not been feeling well following HD for the past few months.  ED work-up revealed a Hb of 7.3 which is significantly down from his baseline of 9 per the records. He denies and melena or hematchezia. No CP, has chronic dyspnea which is slightly worse, lethargy, reports that he has not had a bowel movement since Wednesday but also has had a poor appetite. His daughter reports some kind of "blood infection" but I have no records that indicate he had or was being treated for bacteremia. He has also had some new upper respiratory symptoms and sinus congestion over the past few days.  Review of Systems: Review of Systems  Constitutional: Positive for fever, chills and malaise/fatigue. Negative for weight loss and diaphoresis.  HENT: Positive for congestion and sore throat.   Respiratory: Positive for shortness of breath.   Cardiovascular: Negative.   Gastrointestinal: Positive for constipation. Negative for heartburn, nausea, vomiting,  abdominal pain and diarrhea.  Genitourinary: Negative for dysuria.  Musculoskeletal: Positive for back pain and falls.  Skin: Negative.   Neurological: Positive for dizziness and weakness. Negative for focal weakness.  Endo/Heme/Allergies: Negative.   Psychiatric/Behavioral: Negative for depression. The patient is not nervous/anxious.   All other systems reviewed and are negative.     Past Medical History  Diagnosis Date  . Secondary hyperparathyroidism   . Peripheral vascular disease   . Hyperlipidemia   . Duodenal ulcer     remote  . Gout STABLE  . ESRD (end stage renal disease)     Hemodialysis since 2003; Nephrologist-Dr. Caryn Section  . Arteriosclerotic cardiovascular disease (ASCVD) 1998    CABG-1998; h/o CHF; Atherosclerosis of great vessels, aorta and coronaries on CT in 02/2012  . Cerebrovascular disease   . Neuropathy of foot   . Degenerative joint disease   . Gastroesophageal reflux disease   . History of bladder cancer   . Impaired hearing     Bilateral; hearing aids relatively ineffective  . Anemia     Prior blood transfusion  . Renal cell carcinoma 2001    s/p right nephrectomy-2001; subsequent ESRD  . Hypertension   . Cholelithiasis 03/2011    Diagnosed incidentally on abdominal ultrasound in 03/2011  . Hepatic steatosis   . ESRD (end stage renal disease) on dialysis 08/20/2012   Past Surgical History  Procedure Date  . Right nephrectomy 2001    Renal cell carcinoma  . Colonoscopy 01/24/2011    prominent vascular pattern, suboptimal prep but doable  .  Esophagogastroduodenoscopy 01/24/2011    mild erosive reflux esophagitis, bulbar/antral erosions, bx from antrum benign  . Av fistula repair 2008    revision of anastomosis of Right AVF  . Arteriovenous graft placement 2006    Thrombectomy and interposition jump graft revision to higher axillary vein of LUA AVG  . Coronary artery bypass graft 1998    X4 VESSELS  . Multiple cysto/ resection tumor's with bx's LAST  ONE 05-09-2011  . Multiple surg's / interventions for avgg/ fistula right upper arm LAST REVISION 06-29-2011    (JUNE 2010 STENT CEPHALIC VEIN/ 03-23-2011 DILATATION ANGIOPLASTY)  . Left ptc left renal artery   . Cardiac catheterization 2004  . Cataract extraction w/ intraocular lens  implant, bilateral   . Cystoscopy w/ retrogrades 12/28/2011    Procedure: CYSTOSCOPY WITH RETROGRADE PYELOGRAM;  Surgeon: [name], MD;  Location: [facility];  Service: Urology;  Laterality: Left;  C-ARM   . Transurethral resection of bladder tumor 12/28/2011    Procedure: TRANSURETHRAL RESECTION OF BLADDER TUMOR (TURBT);  Surgeon: [name], MD;  Location: [facility];  Service: Urology;  Laterality: N/A;  . Av fistula placement 02/29/2012    Procedure: INSERTION OF ARTERIOVENOUS (AV) GORE-TEX GRAFT THIGH;  Surgeon: [name], MD;  Location: [facility] OR;  Service: Vascular;  Laterality: Right;  Insertion right femoral arteriovenous gortex graft  . Ligation of right braciocephalic av fistula 04-04-2012  . Insertion of dialysis catheter 06/05/2012    Procedure: INSERTION OF DIALYSIS CATHETER;  Surgeon: Pryor Ochoa, MD;  Location: [facility] OR;  Service: Vascular;  Laterality: Left;  Insertion diatek catheter left IJ  . Insertion of dialysis catheter 08/22/2012    Procedure: INSERTION OF DIALYSIS CATHETER;  Surgeon: Nada Libman, MD;  Location: [facility];  Service: Vascular;  Laterality: Left;  insertion of dialysis catheter left  internal jugular 27cm  . Av fistula placement 10/08/2012    Procedure: INSERTION OF ARTERIOVENOUS (AV) GORE-TEX GRAFT THIGH;  Surgeon: Chuck Hint, MD;  Location: [facility] OR;  Service: Vascular;  Laterality: Left;   Social History:  reports that he quit smoking about 28 years ago. His smoking use included Cigarettes. He has a 25 pack-year smoking history. He has quit using smokeless tobacco. He reports that he does not drink alcohol or use illicit  drugs. No Known Allergies Lives at home independently with help of his three daughters.   Family History  Problem Relation Age of Onset  . Colon cancer Neg Hx   . Liver disease Neg Hx   . Anesthesia problems Neg Hx   . Heart disease Father      Prior to Admission medications   Medication Sig Start Date End Date Taking? Authorizing Provider  acetaminophen (TYLENOL) 500 MG tablet Take 1,000 mg by mouth every 6 (six) hours as needed. For pain/headache   Yes Historical Provider, MD  allopurinol (ZYLOPRIM) 100 MG tablet Take 100 mg by mouth at bedtime.  09/19/11  Yes Rhetta Mura, MD  aspirin EC 81 MG tablet Take 81 mg by mouth at bedtime.    Yes Historical Provider, MD  atorvastatin (LIPITOR) 10 MG tablet Take 10 mg by mouth every morning.    Yes Historical Provider, MD  B Complex-C-Folic Acid (DIALYVITE TABLET) TABS Take 1 tablet by mouth at bedtime.  05/24/12  Yes Historical Provider, MD  calcium carbonate (TUMS - DOSED IN MG ELEMENTAL CALCIUM) 500 MG chewable tablet Chew 1 tablet by mouth daily as needed.  For upset stomach   Yes Historical Provider, MD  cinacalcet (SENSIPAR) 30 MG tablet Take 30 mg by mouth daily with breakfast.    Yes Historical Provider, MD  colchicine (COLCRYS) 0.6 MG tablet Take 0.6 mg by mouth 2 (two) times daily as needed. for gout   Yes Historical Provider, MD  gabapentin (NEURONTIN) 100 MG capsule Take 100 mg by mouth 2 (two) times daily.  01/09/12 01/08/13 Yes Kathlen Brunswick, MD  midodrine (PROAMATINE) 10 MG tablet Take 1 tablet (10 mg total) by mouth every Monday, Wednesday, and Friday with hemodialysis. 08/26/12  Yes Ripudeep Jenna Luo, MD  omeprazole (PRILOSEC) 40 MG capsule Take 40 mg by mouth at bedtime. For reflux   Yes Historical Provider, MD  polyethylene glycol powder (MIRALAX) powder Take 17 g by mouth at bedtime.    Yes Historical Provider, MD  sevelamer (RENVELA) 800 MG tablet Take 1,600-3,200 mg by mouth 5 (five) times daily. 4 tabs 3 x daily and 2  with snack   Yes Historical Provider, MD   Physical Exam: Filed Vitals:   10/19/12 2012 10/19/12 2251 10/19/12 2342 10/19/12 2359  BP:  120/51 127/60   Pulse:  85 102   Temp: 99.6 F (37.6 C)  99 F (37.2 C)   TempSrc: Rectal  Oral   Resp:   20   Height:   5\' 8"  (1.727 m)   Weight:   77.2 kg (170 lb 3.1 oz)   SpO2:  95% 96% 96%     General:  Pale, relatively healthy looking given his medical history  Eyes: normal  ENT: normal  Neck: normal  Cardiovascular: RRR, no mrg  Respiratory:  CTAB  Abdomen: Soft, NT, hypoactive BS  Skin: no rashes  Musculoskeletal: normal tone, left thigh graft maturing, UE with nonfunctioning grafts  Psychiatric: normal, AOX3  Neurologic: non-focal  Labs on Admission:  Basic Metabolic Panel:  Lab 10/19/12 1610  NA 135  K 4.9  CL 94*  CO2 28  GLUCOSE 112*  BUN 40*  CREATININE 6.63*  CALCIUM 9.3  MG --  PHOS --   Liver Function Tests:  Lab 10/19/12 1827  AST 25  ALT 16  ALKPHOS 160*  BILITOT 0.3  PROT 6.3  ALBUMIN 3.0*   CBC:  Lab 10/19/12 1827  WBC 4.7  NEUTROABS 3.6  HGB 7.3*  HCT 22.7*  MCV 110.7*  PLT 145*   Cardiac Enzymes:  Lab 10/19/12 1827  CKTOTAL 58  CKMB --  CKMBINDEX --  TROPONINI --    BNP (last 3 results)  Basename 04/21/12 1620  PROBNP 25486.0*    Radiological Exams on Admission: Dg Chest 2 View  10/19/2012  *RADIOLOGY REPORT*  Clinical Data: 78 year old male weakness, dialysis patient.  Fever.  CHEST - 2 VIEW  Comparison: 10/07/2012 and earlier.  Findings: Upright AP and lateral views of the chest.  Stable left chest tunneled dual lumen dialysis type catheter.  Stable sequelae of CABG.  Slightly lower lung volumes.  Stable cardiac size and mediastinal contours.  No pneumothorax, pulmonary edema, pleural effusion or acute pulmonary opacity.  Right subclavian stent. Postoperative changes to the left axilla.  IMPRESSION: No acute cardiopulmonary abnormality.   Original Report Authenticated  By: Erskine Speed, M.D.     Assessment/Plan Principal Problem:  *ANEMIA Active Problems:  Hypertension  Hyperlipidemia  ESRD (end stage renal disease)  Arteriosclerotic cardiovascular disease (ASCVD)  Peripheral vascular disease  Delirium  Weakness generalized  Fall  Constipation  Anorexia   Andrew [name] looks remarkably well for having been on HD for almost 13 years in addition to his other co-morbidities. Tonight he and his daughter are most worried about his severe weakness and mild confusion which can best be attributed to his Anemia of 7.3 which is much lower than his reported baseline. There is no evidence of infection on CXR or blood work and he has been afebrile. He has not had BM in several days   Admit for blood transfusion, will probably need to be given with HD, consult placed to nephrology  Check FOBT  Anemia panel ordered, macrocytosis.  Aggressive bowel regimen  Blood Cultures  Early mobility  Code Status: I had very extensive discussion with patient and his daughter regarding code status and goals of care-they were very appropriate and we explored the details of a limited code status- they clearly want aggressive care for reversible problems, but if th patient has a cardiac arrest he would like to be allowed to be peacefully dead with no heroics. His daughter requested information on HCPOA documentation and paper work do SW order placed to assist wit that.  Family Communication: Discussed care plan with patient and his daughter at bedside.  Disposition Plan:  Home when medically stable, 2-3 days to monitor response to blood transfusion.  Time spent: 70 minutes  [facility] Pager [phone]  If 7PM-7AM, please contact night-coverage [email or web address] Password [facility] 10/20/2012, 4:28 AM

## 2012-10-21 ENCOUNTER — Inpatient Hospital Stay (HOSPITAL_COMMUNITY): Payer: Medicare Other

## 2012-10-21 ENCOUNTER — Encounter (HOSPITAL_COMMUNITY): Payer: Self-pay | Admitting: Internal Medicine

## 2012-10-21 DIAGNOSIS — K5900 Constipation, unspecified: Secondary | ICD-10-CM

## 2012-10-21 DIAGNOSIS — D696 Thrombocytopenia, unspecified: Secondary | ICD-10-CM

## 2012-10-21 LAB — BASIC METABOLIC PANEL
Calcium: 9.3 mg/dL (ref 8.4–10.5)
Creatinine, Ser: 5.69 mg/dL — ABNORMAL HIGH (ref 0.50–1.35)
GFR calc non Af Amer: 8 mL/min — ABNORMAL LOW (ref >90)
Glucose, Bld: 86 mg/dL (ref 70–99)
Sodium: 136 mEq/L (ref 135–145)

## 2012-10-21 LAB — CBC
Hemoglobin: 8.9 g/dL — ABNORMAL LOW (ref 13.0–17.0)
MCH: 34 pg (ref 26.0–34.0)
MCHC: 32.5 g/dL (ref 30.0–36.0)
MCV: 104.6 fL — ABNORMAL HIGH (ref 78.0–100.0)

## 2012-10-21 MED ORDER — BISACODYL 10 MG RE SUPP: 10.0000 mg | Freq: Once | RECTAL | Status: DC

## 2012-10-21 MED ORDER — EPOETIN ALFA 10000 UNIT/ML IJ SOLN
10000.0000 [IU] | INTRAMUSCULAR | Status: DC
  Filled 2012-10-21 (×2): qty 1

## 2012-10-21 NOTE — Progress Notes (Signed)
HH RN added to dc order. [facility] notified.

## 2012-10-21 NOTE — Care Management Note (Signed)
    Page 1 of 1   10/21/2012     2:59:16 PM   CARE MANAGEMENT NOTE 10/21/2012  Patient:  Andrew [name], Andrew [name]   Account Number:  [id number]  Date Initiated:  10/21/2012  Documentation initiated by:  Rosemary Holms  Subjective/Objective Assessment:   Pt admitted with anemia. Has ESRD. Lives alone but daughter live nearby and see him everyday. Previously had HH PT by [facility]. Would be agreeable to this again.     Action/Plan:   Anticipated DC Date:  10/21/2012   Anticipated DC Plan:  HOME W HOME HEALTH SERVICES      DC Planning Services  CM consult      Choice offered to / List presented to:          [facility] arranged  HH-2 PT      [facility] agency  [facility]   Status of service:  Completed, signed off Medicare Important Message given?   (If response is "NO", the following Medicare IM given date fields will be blank) Date Medicare IM given:   Date Additional Medicare IM given:    Discharge Disposition:  HOME W HOME HEALTH SERVICES  Per UR Regulation:    If discussed at Long Length of Stay Meetings, dates discussed:    Comments:  10/21/12 Rosemary Holms RN BSN CM

## 2012-10-21 NOTE — Progress Notes (Signed)
UR Chart Review Completed  

## 2012-10-21 NOTE — Clinical Social Work Note (Signed)
CSW received referral for advance directives. Packet provided to pt and explained. He is interested in completing living will and HCPOA. After looking over packet, pt decided he would like to wait until his daughter is present so they can discuss some things. Pt will notify CSW when ready.  Derenda Fennel, [place] [phone]

## 2012-10-21 NOTE — Discharge Summary (Signed)
Physician Discharge Summary  BOSS KUBIK [name] DOB: 12/05/1925 DOA: 10/19/2012  PCP: [name]., MD  Admit date: 10/19/2012 Discharge date: 10/21/2012  Time spent: Greater than 30 minutes  Recommendations for Outpatient Follow-up:  1. The patient will followup with his primary care physician as scheduled on 11/04/2012. 2. Consider outpatient referral to hematology. 3. Consider outpatient referral for followup with gastroenterology.  Discharge Diagnoses:  1. Severe anemia, likely secondary to end-stage renal disease, with mild iron deficiency. The patient's hemoglobin was 7.3 on admission and 8.9, status post 2 units of packed red blood cell transfusions. Anemia panel revealed a total iron of 40, TIBC of 247, percent saturation of 16, ferritin of 942, and vitamin B12 of 1076. 2. Per colonoscopy in 2012, he had a prominent vascular pattern and the EGD in 2012 revealed erosive esophagitis and antral erosions. 3. Generalized weakness secondary to severe anemia. 4. Chronic macrocytosis and thrombocytopenia. Platelet count for the past year has ranged from 88,000-144,000. 5. End-stage renal disease, on dialysis. 6. Hyperkalemia secondary to end-stage renal disease. 7. Coronary artery disease and peripheral vascular disease. 8. Hypertension. 9. Nontraumatic fall at home. 10. Delirium, secondary to anemia. Resolved. 11. Transient abdominal pain. 12. Chronic constipation. 13. Radiographic evidence of gallstones.  Discharge Condition: Stable and improved.  Diet recommendation: Renal/heart healthy.  Filed Weights   10/20/12 0536 10/20/12 1100 10/21/12 1521  Weight: 77 kg (169 lb 12.1 oz) 77 kg (169 lb 12.1 oz) 79 kg (174 lb 2.6 oz)    History of present illness:  The patient is an 78 year old man with multiple comorbid conditions including end-stage renal disease, bladder cancer, renal cell carcinoma, and coronary artery disease, who presented to the emergency department  on 10/19/2012 with a chief complaint of severe weakness. His family also reported some intermittent confusion and upper respiratory type symptoms. During the evaluation in the emergency department, the patient was noted to be afebrile and hemodynamically stable. His lab data were significant for a hemoglobin of 7.3, MCV of 110.7, platelet count of 145, BUN of 40, and creatinine of 6.63. His pro BNP was 25,486. His chest x-ray revealed recent subclavian stent, but no acute cardiopulmonary abnormality. He was admitted for further evaluation and management. Please see the dictated history and physical for further details.  Hospital Course:  Patient was typed and crossed for packed red blood cells. Nephrologist, Dr. Kristian Covey was consulted for hemodialysis. The transfusions would be given during hemodialysis. Prior to transfusions, an anemia panel and additional studies were ordered. The results of the anemia panel were dictated above. The results were not very impressive in that they revealed only mild iron deficiency. His TSH was within normal limits at 1.2 and his free T4 was within normal limits at 0.89. Epogen was started by Dr. Kristian Covey. The patient was also noted to have thrombocytopenia. In looking back over the past year, he has a history of chronic thrombocytopenia with a platelet count ranging from 88,000-144,000. The etiology of the thrombocytopenia was unknown. His TSH was within normal limits and  his vitamin B12 level was actually elevated. Hematology was not consulted during this hospitalization, as the patient's platelet count appeared to not be significantly lower than his baseline. An outpatient evaluation by hematology is a consideration, but this would be deferred to his primary care physician.  Following the blood transfusions, the patient's hemoglobin improved appropriately. There was no evidence of bright red blood per rectum or black tarry stools. He was maintained on proton pump inhibitor  therapy  for treatment of his history of erosive esophagitis and antral erosions per EGD in 2012. It was likely that his progressive anemia was from chronic kidney disease/renal failure, however, a follow up evaluation by gastroenterology can be considered in the outpatient setting.  He complained of constipation throughout the hospitalization. He was continued on MiraLax and was given a suppository. He had a small bowel movement during the hospital course. He was noted to have gallstones on the KUB, but he was not tender in his right upper quadrant. His liver transaminases were essentially normal.  The patient's strength improved. His mild confusion resolved. He was evaluated by the physical therapist, who recommended home health physical therapy. This was ordered. He remained hemodynamically stable, except, during hemodialysis, he became transiently hypotensive and tachycardic. Therefore hemodialysis and following hemodialysis, he was generally hemodynamically stable.  Procedures:  Hemodialysis  Consultations:  Nephrologist,  Dr. Kristian Covey  Discharge Exam: Filed Vitals:   10/21/12 1550 10/21/12 1620 10/21/12 1650 10/21/12 1720  BP: 142/81 136/75 165/72 101/81  Pulse: 94 83 83 125  Temp:      TempSrc:      Resp: 18 16 16 18   Height:      Weight:      SpO2:       (Exam performed earlier in the day) General: pleasant elderly 78 year old man laying in bed in no acute distress. Cardiovascular: S1, S2, with a 2/6 systolic murmur. Respiratory: Clear to auscultation bilaterally. Abdomen: Positive bowel sounds, soft, very minimally tender in the epigastrium, no rebound, no guarding, no masses palpated.  Discharge Instructions  Discharge Orders    Future Orders Please Complete By Expires   Diet - low sodium heart healthy      Increase activity slowly      Discharge instructions      Comments:   Report any black tarry stools or bright red blood in your stools to your doctor.         Medication List     As of 10/21/2012  5:30 PM    TAKE these medications         acetaminophen 500 MG tablet   Commonly known as: TYLENOL   Take 1,000 mg by mouth every 6 (six) hours as needed. For pain/headache      allopurinol 100 MG tablet   Commonly known as: ZYLOPRIM   Take 100 mg by mouth at bedtime.      aspirin EC 81 MG tablet   Take 81 mg by mouth at bedtime.      atorvastatin 10 MG tablet   Commonly known as: LIPITOR   Take 10 mg by mouth every morning.      calcium carbonate 500 MG chewable tablet   Commonly known as: TUMS - dosed in mg elemental calcium   Chew 1 tablet by mouth daily as needed. For upset stomach      cinacalcet 30 MG tablet   Commonly known as: SENSIPAR   Take 30 mg by mouth daily with breakfast.      COLCRYS 0.6 MG tablet   Generic drug: colchicine   Take 0.6 mg by mouth 2 (two) times daily as needed. for gout      DIALYVITE TABLET Tabs   Take 1 tablet by mouth at bedtime.      gabapentin 100 MG capsule   Commonly known as: NEURONTIN   Take 100 mg by mouth 2 (two) times daily.      midodrine 10 MG tablet   Commonly  known as: PROAMATINE   Take 1 tablet (10 mg total) by mouth every Monday, Wednesday, and Friday with hemodialysis.      MIRALAX powder   Generic drug: polyethylene glycol powder   Take 17 g by mouth at bedtime.      omeprazole 40 MG capsule   Commonly known as: PRILOSEC   Take 40 mg by mouth at bedtime. For reflux      sevelamer 800 MG tablet   Commonly known as: RENVELA   Take 1,600-3,200 mg by mouth 5 (five) times daily. 4 tabs 3 x daily and 2 with snack           Follow-up Information    Follow up with [name]., MD. On 11/04/2012. (FOLLOW UP WITH MD 12/9 AT 3:15)    Contact information:   [address] [place] [address] [phone]           The results of significant diagnostics from this hospitalization (including imaging, microbiology, ancillary and laboratory) are  listed below for reference.    Significant Diagnostic Studies: Dg Chest 2 View  10/19/2012  *RADIOLOGY REPORT*  Clinical Data: 78 year old male weakness, dialysis patient.  Fever.  CHEST - 2 VIEW  Comparison: 10/07/2012 and earlier.  Findings: Upright AP and lateral views of the chest.  Stable left chest tunneled dual lumen dialysis type catheter.  Stable sequelae of CABG.  Slightly lower lung volumes.  Stable cardiac size and mediastinal contours.  No pneumothorax, pulmonary edema, pleural effusion or acute pulmonary opacity.  Right subclavian stent. Postoperative changes to the left axilla.  IMPRESSION: No acute cardiopulmonary abnormality.   Original Report Authenticated By: Erskine Speed, M.D.    Dg Chest 2 View  10/06/2012  *RADIOLOGY REPORT*  Clinical Data: Shortness of breath  CHEST - 2 VIEW  Comparison: 08/22/2012  Findings: Left chest wall dialysis catheters noted with tips in the distal SVC and cavoatrial junction.  Previous median sternotomy CABG procedure.  Small bilateral pleural effusions and mild interstitial edema is noted.  No airspace consolidation.  IMPRESSION:  1.  Mild CHF.   Original Report Authenticated By: Signa Kell, M.D.    Dg Abd 1 View  10/21/2012  *RADIOLOGY REPORT*  Clinical Data: Abdominal pain with nausea and constipation.  ABDOMEN - 1 VIEW  Comparison: None.  Findings: There are numerous calcified gallstones.  No dilated loops of large or small bowel.  Multiple surgical clips in the abdomen and pelvis.  Left renal artery stent in place.  Slight degenerative changes in the lumbar spine.  Extensive calcification in the abdominal aorta and iliac arteries.  Also calcifications in the left mid abdomen which may be stones in the left kidney.  No fecal impaction or extensive stool in the colon.  IMPRESSION: No acute abnormalities.  Cholelithiasis.  Possible stones in the left kidney.   Original Report Authenticated By: Francene Boyers, M.D.    Dg Chest Port 1  View  10/07/2012  *RADIOLOGY REPORT*  Clinical Data: Weakness.  PORTABLE CHEST - 1 VIEW  Comparison: 10/06/2012  Findings: Left dialysis catheter remains in place, unchanged. Prior CABG.  Heart is upper limits normal in size.  No confluent airspace opacities, effusions or edema.  No acute bony abnormality.  IMPRESSION: Improved CHF pattern.   Original Report Authenticated By: Charlett Nose, M.D.     Microbiology: Recent Results (from the past 240 hour(s))  CULTURE, BLOOD (ROUTINE X 2)     Status: Normal (Preliminary result)   Collection  Time   10/19/12  6:17 PM      Component Value Range Status Comment   Specimen Description BLOOD LEFT ARM   Final    Special Requests BOTTLES DRAWN AEROBIC AND ANAEROBIC 6CC   Final    Culture NO GROWTH 2 DAYS   Final    Report Status PENDING   Incomplete   CULTURE, BLOOD (ROUTINE X 2)     Status: Normal (Preliminary result)   Collection Time   10/19/12  6:29 PM      Component Value Range Status Comment   Specimen Description BLOOD LEFT ARM   Final    Special Requests BOTTLES DRAWN AEROBIC AND ANAEROBIC 6CC   Final    Culture NO GROWTH 2 DAYS   Final    Report Status PENDING   Incomplete      Labs: Basic Metabolic Panel:  Lab 10/21/12 1610 10/20/12 0516 10/19/12 1827  NA 136 134* 135  K 4.4 5.9* 4.9  CL 96 92* 94*  CO2 29 30 28   GLUCOSE 86 94 112*  BUN 32* 49* 40*  CREATININE 5.69* 7.88* 6.63*  CALCIUM 9.3 9.7 9.3  MG -- -- --  PHOS 4.7* -- --   Liver Function Tests:  Lab 10/19/12 1827  AST 25  ALT 16  ALKPHOS 160*  BILITOT 0.3  PROT 6.3  ALBUMIN 3.0*   No results found for this basename: LIPASE:5,AMYLASE:5 in the last 168 hours No results found for this basename: AMMONIA:5 in the last 168 hours CBC:  Lab 10/21/12 0535 10/20/12 0516 10/19/12 1827  WBC 3.0* 4.0 4.7  NEUTROABS -- -- 3.6  HGB 8.9* 7.3* 7.3*  HCT 27.4* 22.7* 22.7*  MCV 104.6* 110.7* 110.7*  PLT 99* 129* 145*   Cardiac Enzymes:  Lab 10/19/12 1827  CKTOTAL 58   CKMB --  CKMBINDEX --  TROPONINI --   BNP: BNP (last 3 results)  Basename 04/21/12 1620  PROBNP 25486.0*   CBG: No results found for this basename: GLUCAP:5 in the last 168 hours     Signed:  Florine Sprenkle  [facility] 10/21/2012, 5:30 PM   Ad

## 2012-10-21 NOTE — Progress Notes (Signed)
Subjective: Interval History: has no complaint of nausea or vomiting. Presently patient offers no complaints.  Objective: Vital signs in last 24 hours: Temp:  [97.5 F (36.4 C)-98.5 F (36.9 C)] 97.9 F (36.6 C) (11/25 1610) Pulse Rate:  [68-110] 100  (11/25 0614) Resp:  [16-20] 20  (11/25 0614) BP: (79-139)/(39-72) 133/61 mmHg (11/25 0614) SpO2:  [98 %-99 %] 98 % (11/25 0614) Weight:  [77 kg (169 lb 12.1 oz)] 77 kg (169 lb 12.1 oz) (11/24 1100) Weight change: 1.249 kg (2 lb 12.1 oz)  Intake/Output from previous day: 11/24 0701 - 11/25 0700 In: 1690 [P.O.:840; I.V.:50; Blood:700] Out: 1421  Intake/Output this shift:    General appearance: alert, cooperative and no distress Resp: clear to auscultation bilaterally Heart exam revealed regular rate and rhythm no murmur S3 GI: soft, non-tender; bowel sounds normal; no masses,  no organomegaly positive bowel sound nontender and no rebound tenderness. Extremities: extremities normal, atraumatic, no cyanosis or edema   Lab Results:  [facility] 10/21/12 0535 10/20/12 0516  WBC 3.0* 4.0  HGB 8.9* 7.3*  HCT 27.4* 22.7*  PLT 99* 129*   BMET:  Basename 10/21/12 0535 10/20/12 0516  NA 136 134*  K 4.4 5.9*  CL 96 92*  CO2 29 30  GLUCOSE 86 94  BUN 32* 49*  CREATININE 5.69* 7.88*  CALCIUM 9.3 9.7   No results found for this basename: PTH:2 in the last 72 hours Iron Studies:  Basename 10/20/12 0516  IRON 40*  TIBC 247  TRANSFERRIN --  FERRITIN 942*    Studies/Results: Dg Chest 2 View  10/19/2012  *RADIOLOGY REPORT*  Clinical Data: 78 year old male weakness, dialysis patient.  Fever.  CHEST - 2 VIEW  Comparison: 10/07/2012 and earlier.  Findings: Upright AP and lateral views of the chest.  Stable left chest tunneled dual lumen dialysis type catheter.  Stable sequelae of CABG.  Slightly lower lung volumes.  Stable cardiac size and mediastinal contours.  No pneumothorax, pulmonary edema, pleural effusion or acute pulmonary  opacity.  Right subclavian stent. Postoperative changes to the left axilla.  IMPRESSION: No acute cardiopulmonary abnormality.   Original Report Authenticated By: Erskine Speed, M.D.     I have reviewed the patient's current medications.  Assessment/Plan: Problem #1 end-stage renal  he status post hemodialysis on yesterday his BUN and creatinine is was in acceptable range. Problem #2 hyperkalemia potassium is 4.4  Problem #3 anemia H&H has increased pain and he is status post blood transfusion yesterday. Problem #4 hypertension Problem #5 coronary artery disease Problem #6 history of peripheral vascular disease Problem #7 metabolic disease phosphorus is 4.7 seems to be improving with normal calcium. Plan: We'll make arrangements for patient to get dialysis tomorrow it is no going to be discharged today. Otherwise we'll call dialysis unit and see if he can have dialysis as outpatient.   LOS: 2 days   Lakyla Biswas S 10/21/2012,7:34 AM

## 2012-10-21 NOTE — Progress Notes (Signed)
D/c instructions reviewed with patient and daughter.  Verbalized understanding.  Pt dc'd to home with daughter.  Schonewitz, Candelaria Stagers 10/21/2012

## 2012-10-21 NOTE — Progress Notes (Signed)
Dialysis initiated via left IJ permacath without difficulty. \ Outpatient plan of care requested from [facility]

## 2012-10-21 NOTE — Evaluation (Signed)
Physical Therapy Evaluation Patient Details Name: Andrew [name] MRN: [id number] DOB: [birth year] Today's Date: 10/21/2012 Time: 4098-1191 PT Time Calculation (min): 22 min  PT Assessment / Plan / Recommendation Clinical Impression  Pt is found to be only mildly deconditioned as compared to prior functional level.  Gait is stable with a cane but he tires more quickly than normal.  Recommend  HHPT at d/c for home safety eval.    PT Assessment  All further PT needs can be met in the next venue of care    Follow Up Recommendations  Home health PT    Does the patient have the potential to tolerate intense rehabilitation      Barriers to Discharge None      Equipment Recommendations  None recommended by PT    Recommendations for Other Services     Frequency      Precautions / Restrictions Precautions Precautions: None Restrictions Weight Bearing Restrictions: No   Pertinent Vitals/Pain       Mobility  Bed Mobility Bed Mobility: Supine to Sit;Sit to Supine Supine to Sit: 6: Modified independent (Device/Increase time);HOB elevated Sit to Supine: 6: Modified independent (Device/Increase time);HOB elevated Transfers Transfers: Sit to Stand;Stand to Sit Sit to Stand: 6: Modified independent (Device/Increase time);From chair/3-in-1 Stand to Sit: 6: Modified independent (Device/Increase time);To bed Ambulation/Gait Ambulation/Gait Assistance: 6: Modified independent (Device/Increase time) Ambulation Distance (Feet): 100 Feet Assistive device: Straight cane Gait Pattern: Within Functional Limits Gait velocity: WNL General Gait Details: gait is stable but he tires more quickly than normal Stairs: No    Shoulder Instructions     Exercises General Exercises - Lower Extremity Straight Leg Raises: AROM;Both;10 reps;Supine Other Exercises Other Exercises: bridging x 10 reps   PT Diagnosis:    PT Problem List: Other (comment) (home safety concerns) PT Treatment  Interventions:     PT Goals    Visit Information  Last PT Received On: 10/21/12    Subjective Data  Subjective: no c/o Patient Stated Goal: none stated   Prior Functioning  Home Living Lives With: Alone Available Help at Discharge: Family;Available PRN/intermittently Type of Home: House Home Access: Level entry Home Layout: One level Bathroom Shower/Tub: Engineer, manufacturing systems: Standard Home Adaptive Equipment: Walker - rolling;Straight cane;Wheelchair - manual Additional Comments: recommended that pt have grab bar installed in tub surround Prior Function Level of Independence: Independent Able to Take Stairs?: Yes Driving: Yes Vocation: Retired Musician: Clinical cytogeneticist  Overall Cognitive Status: Appears within functional limits for tasks assessed/performed Arousal/Alertness: Awake/alert Orientation Level: Appears intact for tasks assessed Behavior During Session: [facility] for tasks performed    Extremity/Trunk Assessment Right Lower Extremity Assessment RLE ROM/Strength/Tone: WFL for tasks assessed RLE Sensation: WFL - Light Touch RLE Coordination: WFL - gross motor Left Lower Extremity Assessment LLE ROM/Strength/Tone: WFL for tasks assessed LLE Sensation: WFL - Light Touch LLE Coordination: WFL - gross motor Trunk Assessment Trunk Assessment: Normal   Balance Balance Balance Assessed: Yes High Level Balance High Level Balance Activites: Backward walking;Direction changes;Turns;Head turns;Sudden stops High Level Balance Comments: no LOB with above  End of Session PT - End of Session Equipment Utilized During Treatment: Gait belt Activity Tolerance: Patient tolerated treatment well Patient left: in bed;with call bell/phone within reach;with bed alarm set  GP     Myrlene Broker L 10/21/2012, 9:30 AM

## 2012-10-23 LAB — TYPE AND SCREEN
Unit division: 0
Unit division: 0

## 2012-10-24 LAB — CULTURE, BLOOD (ROUTINE X 2)

## 2012-12-03 ENCOUNTER — Ambulatory Visit (HOSPITAL_COMMUNITY): Payer: Medicare Other | Admitting: Oncology

## 2012-12-04 ENCOUNTER — Other Ambulatory Visit: Payer: Self-pay | Admitting: *Deleted

## 2012-12-10 ENCOUNTER — Ambulatory Visit (HOSPITAL_COMMUNITY)
Admission: RE | Admit: 2012-12-10 | Discharge: 2012-12-10 | Disposition: A | Discharge status: Home / Self Care | Payer: Medicare Other | Source: Ambulatory Visit | Attending: Vascular Surgery | Admitting: Vascular Surgery

## 2012-12-10 DIAGNOSIS — N186 End stage renal disease: Secondary | ICD-10-CM | POA: Insufficient documentation

## 2012-12-10 DIAGNOSIS — Z452 Encounter for adjustment and management of vascular access device: Secondary | ICD-10-CM | POA: Insufficient documentation

## 2012-12-10 NOTE — H&P (Signed)
[facility] SHORT STAY H&P  CC: ESRD   HPI: Andrew [name] is a 79 y.o. male who has been on HD through  functioning Hemodialysis access in the left lower extremity. They are here for HD catheter removal. Pt. denies signs of steal syndrome.  Past Medical History  Diagnosis Date  . Secondary hyperparathyroidism   . Peripheral vascular disease   . Hyperlipidemia   . Duodenal ulcer     remote  . Gout STABLE  . ESRD (end stage renal disease)     Hemodialysis since 2003; Nephrologist-Dr. Caryn Section  . Arteriosclerotic cardiovascular disease (ASCVD) 1998    CABG-1998; h/o CHF; Atherosclerosis of great vessels, aorta and coronaries on CT in 02/2012  . Cerebrovascular disease   . Neuropathy of foot   . Degenerative joint disease   . Gastroesophageal reflux disease   . History of bladder cancer   . Impaired hearing     Bilateral; hearing aids relatively ineffective  . Anemia     Prior blood transfusion  . Renal cell carcinoma 2001    s/p right nephrectomy-2001; subsequent ESRD  . Hypertension   . Cholelithiasis 03/2011    Diagnosed incidentally on abdominal ultrasound in 03/2011  . Hepatic steatosis   . ESRD (end stage renal disease) on dialysis 08/20/2012  . Macrocytosis 10/20/2012  . Thrombocytopenia 10/20/2012  . Gallstones     FH:  Non-Contributory  Social HX History  Substance Use Topics  . Smoking status: Former Smoker -- 1.0 packs/day for 25 years    Types: Cigarettes    Quit date: 02/26/1984  . Smokeless tobacco: Former Neurosurgeon     Comment: quit 1985  . Alcohol Use: No    Allergies No Known Allergies  Medications Current Outpatient Prescriptions  Medication Sig Dispense Refill  . acetaminophen (TYLENOL) 500 MG tablet Take 1,000 mg by mouth every 6 (six) hours as needed. For pain/headache      . allopurinol (ZYLOPRIM) 100 MG tablet Take 100 mg by mouth at bedtime.       Marland Kitchen aspirin EC 81 MG tablet Take 81 mg by mouth at bedtime.       Marland Kitchen atorvastatin  (LIPITOR) 10 MG tablet Take 10 mg by mouth every morning.       . B Complex-C-Folic Acid (DIALYVITE TABLET) TABS Take 1 tablet by mouth at bedtime.       . calcium carbonate (TUMS - DOSED IN MG ELEMENTAL CALCIUM) 500 MG chewable tablet Chew 1 tablet by mouth daily as needed. For upset stomach      . cinacalcet (SENSIPAR) 30 MG tablet Take 30 mg by mouth daily with breakfast.       . colchicine (COLCRYS) 0.6 MG tablet Take 0.6 mg by mouth 2 (two) times daily as needed. for gout      . gabapentin (NEURONTIN) 100 MG capsule Take 100 mg by mouth 2 (two) times daily.       . midodrine (PROAMATINE) 10 MG tablet Take 1 tablet (10 mg total) by mouth every Monday, Wednesday, and Friday with hemodialysis.  30 tablet  3  . omeprazole (PRILOSEC) 40 MG capsule Take 40 mg by mouth at bedtime. For reflux      . polyethylene glycol powder (MIRALAX) powder Take 17 g by mouth at bedtime.       . sevelamer (RENVELA) 800 MG tablet Take 1,600-3,200 mg by mouth 5 (five) times daily. 4 tabs 3 x daily and 2 with snack  No current facility-administered medications for this encounter.   Facility-Administered Medications Ordered in Other Encounters  Medication Dose Route Frequency Provider Last Rate Last Dose  . albuterol (PROVENTIL) (5 MG/ML) 0.5% nebulizer solution 2.5 mg  2.5 mg Nebulization Q2H PRN Maree Krabbe, MD      . methylPREDNISolone sodium succinate (SOLU-MEDROL) 125 mg/2 mL injection 60 mg  60 mg Intravenous Once Maree Krabbe, MD        Labs COAG Lab Results  Component Value Date   INR 1.10 10/08/2012   INR 1.07 04/04/2012   INR 1.17 03/10/2012   No results found for this basename: PTT    PHYSICAL EXAM  Filed Vitals:   12/10/12 1330  BP: 103/57  Pulse: 88  Temp: 98.2 F (36.8 C)  Resp: 20    General:  WDWN in NAD HENT: WNL Eyes: Pupils equal Pulmonary: normal non-labored breathing  Cardiac: RRR, Skin: normal, no cyanosis, jaundice, pallor or bruising Vascular Exam/LLE warm  with good sensation and motion Extremities without ischemic changes, no Gangrene , no cellulitis; no open wounds;   There is a good thrill and good bruit in the left femoral AVGG.   Impression: This is a 79 y.o. male who has a functioning HD access.  Plan: Removal of Left IJ HD catheter Torrion Witter J 12/10/2012  1:56 PM

## 2012-12-10 NOTE — H&P (Signed)
Agree with above.  Cari Caraway Beeper [phone] 12/10/2012

## 2012-12-10 NOTE — Progress Notes (Signed)
[facility] Catheter Removal Procedure Note  Diagnosis: ESRD with Functioning AVF/AVGG  Plan:  Remove left diatek catheter  Consent signed:  yes Time out completed:  yes Coumadin:  no PT/INR (if applicable):   Other labs:   Procedure: 1.  Sterile prepping and draping over catheter area 2. 3 ml 2% lidocaine plain instilled at removal site. 3.  left catheter removed in its entirety with cuff in tact. 4.  Complications: none  5. Tip of catheter sent for culture:  no   Patient tolerated procedure well:  yes Pressure held, no bleeding noted, dressing applied Instructions given to the pt regarding wound care and bleeding.  Other:  Andrew [name] 12/10/2012 1:57 PM

## 2012-12-11 ENCOUNTER — Ambulatory Visit (HOSPITAL_COMMUNITY): Payer: Medicare Other | Admitting: Oncology

## 2012-12-11 LAB — POCT I-STAT, CHEM 8
BUN: 32 mg/dL — ABNORMAL HIGH (ref 6–23)
Calcium, Ion: 1.19 mmol/L (ref 1.13–1.30)
HCT: 31 % — ABNORMAL LOW (ref 39.0–52.0)
TCO2: 32 mmol/L (ref 0–100)

## 2012-12-24 ENCOUNTER — Encounter (HOSPITAL_COMMUNITY): Payer: Self-pay | Admitting: Oncology

## 2012-12-24 ENCOUNTER — Encounter (HOSPITAL_COMMUNITY): Payer: Medicare Other | Attending: Oncology | Admitting: Oncology

## 2012-12-24 VITALS — BP 129/59 | HR 91 | Temp 97.4°F | Resp 20 | Ht 68.0 in | Wt 169.5 lb

## 2012-12-24 DIAGNOSIS — G609 Hereditary and idiopathic neuropathy, unspecified: Secondary | ICD-10-CM | POA: Insufficient documentation

## 2012-12-24 DIAGNOSIS — D649 Anemia, unspecified: Secondary | ICD-10-CM

## 2012-12-24 DIAGNOSIS — D539 Nutritional anemia, unspecified: Secondary | ICD-10-CM | POA: Insufficient documentation

## 2012-12-24 DIAGNOSIS — D696 Thrombocytopenia, unspecified: Secondary | ICD-10-CM | POA: Insufficient documentation

## 2012-12-24 LAB — CBC WITH DIFFERENTIAL/PLATELET
Basophils Absolute: 0 10*3/uL (ref 0.0–0.1)
Eosinophils Absolute: 0.2 10*3/uL (ref 0.0–0.7)
Lymphs Abs: 1.4 10*3/uL (ref 0.7–4.0)
MCV: 116.7 fL — ABNORMAL HIGH (ref 78.0–100.0)
Monocytes Absolute: 0.3 10*3/uL (ref 0.1–1.0)
Monocytes Relative: 7 % (ref 3–12)
Platelets: 105 10*3/uL — ABNORMAL LOW (ref 150–400)
RDW: 19.5 % — ABNORMAL HIGH (ref 11.5–15.5)
WBC: 4.1 10*3/uL (ref 4.0–10.5)

## 2012-12-24 LAB — RETICULOCYTES: Retic Count, Absolute: 63.1 10*3/uL (ref 19.0–186.0)

## 2012-12-24 NOTE — Progress Notes (Signed)
Problem number 1 macrocytic anemia, unclear etiology Problem #2 thrombocytopenia intermittently times several years usually in the 100,000-150,000 range. Problem #3 renal failure on dialysis since 2003 Problem #4 ureteral cancer status post nephrectomy by Dr. Vonita Moss 2002 Problem #5 polycystic kidney disease Problem #6 history smoking a pack of cigarettes a day for 30 years quitting many years ago Problem #7 difficulty hearing with a hearing aid in his left ear, the right hearing aid dysfunctional he states Problem #8 peripheral neuropathy on gabapentin with only mild relief presently Problem #9 CABG in the past by Dr. Tyrone Sage in 1998   Very very pleasant 79 year old gentleman with a macrocytic anemia and thrombocytopenia. He did require transfusional therapy when his hemoglobin dropped to less than 7.5 in the fall 2013. His laboratory work was checked on January 22 his hemoglobin was 9.3 g, MCV is 116, platelet count is 102,000. He does not have bleeding issues. He denies night sweats, fevers, chills,. He did have a catheter infection most likely with a temperature of 104 last fall and the catheter from his left chest was removed with disappearance of symptoms.  His appetite is good weight he states is stable but his energy level is diminished especially in the last 6-12 months. Oncology review of systems is noncontributory otherwise. He is accompanied by one of his 3 daughters. He is a widower. He is not aware of any lumps or bumps anywhere. He does have several shunts that clotted over the years.  BP 129/59  Pulse 91  Temp 97.4 F (36.3 C) (Oral)  Resp 20  Ht 5\' 8"  (1.727 m)  Wt 169 lb 8 oz (76.885 kg)  BMI 25.77 kg/m2  SpO2 98%  Very pleasant gentleman hard of hearing but in no acute distress. Facial symmetry is intact. Eyes are pinpoint pupils. EOMs appear intact. Throat is clear. He has no lymphadenopathy in the cervical, subclavicular, infraclavicular, axillary or inguinal areas.  Heart shows grade 2/6 holosystolic murmur in the left upper sternal border. There is no S3 gallop. He has no gynecomastia. Chest incision is well-healed. Old shunt areas on the arms are nonfunctional. Legs are without edema. And showed degenerative arthritic changes only. Nail beds are pale. Abdomen is soft and nontender without obvious hepatosplenomegaly. Bowel sounds are very active. Mouth is very dry however. Lungs are clear. Pulse is 100 both sitting and standing with occasional irregular beats.  This gentleman had a normal B12 level and folic acid level in January of this year. Iron studies were unremarkable and his ferritin was 760. I am worried that he may have this macrocytic anemia and thrombocytopenia from a possible MDS picture. We will check a few other studies and see him back to discuss a possible bone marrow biopsy after our discussion at that time.

## 2012-12-24 NOTE — Patient Instructions (Addendum)
[facility] Discharge Instructions  RECOMMENDATIONS MADE BY THE CONSULTANT AND ANY TEST RESULTS WILL BE SENT TO YOUR REFERRING PHYSICIAN. Lab work today. We will call you with any abnormal test results. Return to clinic to see MD as scheduled.  Thank you for choosing [facility] to provide your oncology and hematology care.  To afford each patient quality time with our providers, please arrive at least 15 minutes before your scheduled appointment time.  With your help, our goal is to use those 15 minutes to complete the necessary work-up to ensure our physicians have the information they need to help with your evaluation and healthcare recommendations.    Effective January 1st, 2014, we ask that you re-schedule your appointment with our physicians should you arrive 10 or more minutes late for your appointment.  We strive to give you quality time with our providers, and arriving late affects you and other patients whose appointments are after yours.    Again, thank you for choosing [facility].  Our hope is that these requests will decrease the amount of time that you wait before being seen by our physicians.       _____________________________________________________________  Should you have questions after your visit to [facility], please contact our office at [phone] between the hours of 8:30 a.m. and 5:00 p.m.  Voicemails left after 4:30 p.m. will not be returned until the following business day.  For prescription refill requests, have your pharmacy contact our office with your prescription refill request.

## 2012-12-25 LAB — FERRITIN: Ferritin: 708 ng/mL — ABNORMAL HIGH (ref 22–322)

## 2012-12-25 LAB — IRON AND TIBC
Iron: 68 ug/dL (ref 42–135)
Saturation Ratios: 25 % (ref 20–55)
TIBC: 276 ug/dL (ref 215–435)
UIBC: 208 ug/dL (ref 125–400)

## 2013-01-11 ENCOUNTER — Other Ambulatory Visit: Payer: Self-pay

## 2013-01-14 ENCOUNTER — Encounter (HOSPITAL_COMMUNITY): Payer: Medicare Other | Attending: Oncology | Admitting: Oncology

## 2013-01-14 VITALS — BP 122/56 | HR 94 | Temp 97.3°F | Resp 18 | Wt 168.3 lb

## 2013-01-14 DIAGNOSIS — G609 Hereditary and idiopathic neuropathy, unspecified: Secondary | ICD-10-CM

## 2013-01-14 DIAGNOSIS — D696 Thrombocytopenia, unspecified: Secondary | ICD-10-CM

## 2013-01-14 DIAGNOSIS — D539 Nutritional anemia, unspecified: Secondary | ICD-10-CM

## 2013-01-14 DIAGNOSIS — D61818 Other pancytopenia: Secondary | ICD-10-CM

## 2013-01-14 NOTE — Progress Notes (Signed)
#  1 macrocytic anemia, suspicious for MDS #2 thrombocytopenia, suspicious for MDS #3 renal failure on dialysis since 2003 #4 ureteral cancer status post nephrectomy by Dr. Larey Dresser 2002 #5 history of smoking cigarettes, one pack a day for 30 years quitting many years ago #6 peripheral neuropathy on gabapentin with minimal improvement #7 CABG by Dr. Tyrone Sage 1998 #8 polycystic kidney disease  Thus far his laboratory work is consistent with a probable MDS picture. I told him that the only clear way to sort this out is to do a bone marrow aspirate and biopsy with cytogenetics. He would like to be observed for now with out the above bone marrow biopsy.  We therefore we'll get blood counts in 6 weeks in 12 weeks and I will see him back. We did discuss the possibility of chemotherapy should he have MDS as well as the possibility of the 5 q. minus syndrome which can be treated with a pill.  We therefore we'll see him back after the blood work in 12 weeks sooner if need be. He knows that if he or his daughter has questions that they can call sooner than his visit.

## 2013-01-14 NOTE — Patient Instructions (Addendum)
.  [facility] Discharge Instructions  RECOMMENDATIONS MADE BY THE CONSULTANT AND ANY TEST RESULTS WILL BE SENT TO YOUR REFERRING PHYSICIAN.  EXAM FINDINGS BY THE PHYSICIAN TODAY AND SIGNS OR SYMPTOMS TO REPORT TO CLINIC OR PRIMARY PHYSICIAN:  Dr. Mariel Sleet thinks this is an MDS process. Only way to know for sure is to do bone marrow biopsy You have chosen for [place] just to observe you for now   INSTRUCTIONS GIVEN AND DISCUSSED: Labs in 6 weeks and 12 weeks then to see Dr. Mariel Sleet   Thank you for choosing [facility] to provide your oncology and hematology care.  To afford each patient quality time with our providers, please arrive at least 15 minutes before your scheduled appointment time.  With your help, our goal is to use those 15 minutes to complete the necessary work-up to ensure our physicians have the information they need to help with your evaluation and healthcare recommendations.    Effective January 1st, 2014, we ask that you re-schedule your appointment with our physicians should you arrive 10 or more minutes late for your appointment.  We strive to give you quality time with our providers, and arriving late affects you and other patients whose appointments are after yours.    Again, thank you for choosing [facility].  Our hope is that these requests will decrease the amount of time that you wait before being seen by our physicians.       _____________________________________________________________  Should you have questions after your visit to [facility], please contact our office at [phone] between the hours of 8:30 a.m. and 5:00 p.m.  Voicemails left after 4:30 p.m. will not be returned until the following business day.  For prescription refill requests, have your pharmacy contact our office with your prescription refill request.

## 2013-02-25 ENCOUNTER — Encounter (HOSPITAL_COMMUNITY): Payer: Medicare Other | Attending: Oncology

## 2013-02-25 DIAGNOSIS — D61818 Other pancytopenia: Secondary | ICD-10-CM | POA: Insufficient documentation

## 2013-02-25 LAB — CBC WITH DIFFERENTIAL/PLATELET
Basophils Absolute: 0 10*3/uL (ref 0.0–0.1)
Basophils Relative: 1 % (ref 0–1)
Eosinophils Absolute: 0.2 10*3/uL (ref 0.0–0.7)
MCHC: 31.2 g/dL (ref 30.0–36.0)
Neutro Abs: 1.6 10*3/uL — ABNORMAL LOW (ref 1.7–7.7)
Neutrophils Relative %: 45 % (ref 43–77)
Platelets: 135 10*3/uL — ABNORMAL LOW (ref 150–400)
RDW: 17.7 % — ABNORMAL HIGH (ref 11.5–15.5)

## 2013-02-25 NOTE — Progress Notes (Signed)
Labs drawn today for cbc/diff 

## 2013-03-12 ENCOUNTER — Encounter: Payer: Self-pay | Admitting: Oncology

## 2013-03-25 ENCOUNTER — Ambulatory Visit (HOSPITAL_COMMUNITY)
Admission: RE | Admit: 2013-03-25 | Discharge: 2013-03-25 | Disposition: A | Discharge status: Home / Self Care | Payer: Medicare Other | Source: Ambulatory Visit | Attending: Nephrology | Admitting: Nephrology

## 2013-03-25 ENCOUNTER — Other Ambulatory Visit (HOSPITAL_COMMUNITY): Payer: Self-pay | Admitting: Nephrology

## 2013-03-25 DIAGNOSIS — I517 Cardiomegaly: Secondary | ICD-10-CM | POA: Insufficient documentation

## 2013-03-25 DIAGNOSIS — I700 Atherosclerosis of aorta: Secondary | ICD-10-CM | POA: Insufficient documentation

## 2013-03-25 DIAGNOSIS — R0602 Shortness of breath: Secondary | ICD-10-CM | POA: Insufficient documentation

## 2013-04-08 ENCOUNTER — Encounter (HOSPITAL_COMMUNITY): Payer: Self-pay | Admitting: Oncology

## 2013-04-08 ENCOUNTER — Encounter (HOSPITAL_COMMUNITY): Payer: Medicare Other | Attending: Oncology

## 2013-04-08 ENCOUNTER — Encounter (HOSPITAL_BASED_OUTPATIENT_CLINIC_OR_DEPARTMENT_OTHER): Payer: Medicare Other | Admitting: Oncology

## 2013-04-08 VITALS — BP 113/50 | HR 111 | Temp 98.2°F | Resp 18 | Wt 168.9 lb

## 2013-04-08 DIAGNOSIS — Z8559 Personal history of malignant neoplasm of other urinary tract organ: Secondary | ICD-10-CM

## 2013-04-08 DIAGNOSIS — N19 Unspecified kidney failure: Secondary | ICD-10-CM

## 2013-04-08 DIAGNOSIS — D61818 Other pancytopenia: Secondary | ICD-10-CM

## 2013-04-08 DIAGNOSIS — G609 Hereditary and idiopathic neuropathy, unspecified: Secondary | ICD-10-CM

## 2013-04-08 LAB — CBC WITH DIFFERENTIAL/PLATELET
Basophils Relative: 1 % (ref 0–1)
Hemoglobin: 9.6 g/dL — ABNORMAL LOW (ref 13.0–17.0)
Lymphs Abs: 1.2 10*3/uL (ref 0.7–4.0)
Monocytes Relative: 10 % (ref 3–12)
Neutro Abs: 1 10*3/uL — ABNORMAL LOW (ref 1.7–7.7)
Neutrophils Relative %: 39 % — ABNORMAL LOW (ref 43–77)
Platelets: 123 10*3/uL — ABNORMAL LOW (ref 150–400)
RBC: 2.61 MIL/uL — ABNORMAL LOW (ref 4.22–5.81)

## 2013-04-08 NOTE — Patient Instructions (Addendum)
.  [facility] Discharge Instructions  RECOMMENDATIONS MADE BY THE CONSULTANT AND ANY TEST RESULTS WILL BE SENT TO YOUR REFERRING PHYSICIAN.  EXAM FINDINGS BY THE PHYSICIAN TODAY AND SIGNS OR SYMPTOMS TO REPORT TO CLINIC OR PRIMARY PHYSICIAN:  Labs are stable  SPECIAL INSTRUCTIONS/FOLLOW-UP: 3 months  Thank you for choosing [facility] to provide your oncology and hematology care.  To afford each patient quality time with our providers, please arrive at least 15 minutes before your scheduled appointment time.  With your help, our goal is to use those 15 minutes to complete the necessary work-up to ensure our physicians have the information they need to help with your evaluation and healthcare recommendations.    Effective January 1st, 2014, we ask that you re-schedule your appointment with our physicians should you arrive 10 or more minutes late for your appointment.  We strive to give you quality time with our providers, and arriving late affects you and other patients whose appointments are after yours.    Again, thank you for choosing [facility].  Our hope is that these requests will decrease the amount of time that you wait before being seen by our physicians.       _____________________________________________________________  Should you have questions after your visit to [facility], please contact our office at [phone] between the hours of 8:30 a.m. and 5:00 p.m.  Voicemails left after 4:30 p.m. will not be returned until the following business day.  For prescription refill requests, have your pharmacy contact our office with your prescription refill request.

## 2013-04-08 NOTE — Progress Notes (Signed)
Labs drawn today for cbc/diff 

## 2013-04-08 NOTE — Progress Notes (Signed)
#  1 pancytopenia intermittently suspicious for MDS however he typically has macrocytic anemia, occasional thrombocytopenia, and leukopenia. For example his lab work the other day shows a hemoglobin of 10.4 g but normal platelet count of 168,000 but his white count was low at 3100 with a decrease in neutrophils.  He has no symptoms that are new or different.  #2 renal failure on dialysis since 2003 #3 ureteral cancer status post nephrectomy 2002 #4 Long-standing smoking history pack a day for 30 years quitting many years ago #5 peripheral neuropathy on gabapentin with minimal improvement #6 CABG Dr. Tyrone Sage 1998 #7 polycystic kidney disease accounting for his renal failure  His laboratory is essentially stable and once again he declines a bone marrow aspirate and biopsy. His review of systems is unchanged. Since he has been stable now every 6 weeks we'll change it every 12 weeks and see him back in August. He is happy with this plan but knows that if things change were happy to see him sooner.

## 2013-04-11 ENCOUNTER — Ambulatory Visit (INDEPENDENT_AMBULATORY_CARE_PROVIDER_SITE_OTHER): Payer: Medicare Other | Admitting: Urology

## 2013-04-11 DIAGNOSIS — Z8551 Personal history of malignant neoplasm of bladder: Secondary | ICD-10-CM

## 2013-04-25 ENCOUNTER — Other Ambulatory Visit (HOSPITAL_COMMUNITY): Payer: Self-pay | Admitting: Nephrology

## 2013-04-25 ENCOUNTER — Ambulatory Visit (HOSPITAL_COMMUNITY)
Admission: RE | Admit: 2013-04-25 | Discharge: 2013-04-25 | Disposition: A | Discharge status: Home / Self Care | Payer: Medicare Other | Source: Ambulatory Visit | Attending: Nephrology | Admitting: Nephrology

## 2013-04-25 DIAGNOSIS — R05 Cough: Secondary | ICD-10-CM

## 2013-04-25 DIAGNOSIS — R0989 Other specified symptoms and signs involving the circulatory and respiratory systems: Secondary | ICD-10-CM | POA: Insufficient documentation

## 2013-04-25 DIAGNOSIS — R0602 Shortness of breath: Secondary | ICD-10-CM | POA: Insufficient documentation

## 2013-04-25 DIAGNOSIS — R059 Cough, unspecified: Secondary | ICD-10-CM | POA: Insufficient documentation

## 2013-06-09 ENCOUNTER — Ambulatory Visit (HOSPITAL_COMMUNITY)
Admission: RE | Admit: 2013-06-09 | Discharge: 2013-06-09 | Disposition: A | Discharge status: Home / Self Care | Payer: Medicare Other | Source: Ambulatory Visit | Attending: Nephrology | Admitting: Nephrology

## 2013-06-09 DIAGNOSIS — D539 Nutritional anemia, unspecified: Secondary | ICD-10-CM | POA: Insufficient documentation

## 2013-06-09 DIAGNOSIS — D61818 Other pancytopenia: Secondary | ICD-10-CM | POA: Insufficient documentation

## 2013-06-09 LAB — HEMOGLOBIN AND HEMATOCRIT, BLOOD
HCT: 20.2 % — ABNORMAL LOW (ref 39.0–52.0)
Hemoglobin: 6.9 g/dL — CL (ref 13.0–17.0)

## 2013-06-10 ENCOUNTER — Ambulatory Visit (HOSPITAL_COMMUNITY)
Admission: RE | Admit: 2013-06-10 | Discharge: 2013-06-10 | Disposition: A | Discharge status: Home / Self Care | Payer: Medicare Other | Source: Ambulatory Visit | Attending: Nephrology | Admitting: Nephrology

## 2013-06-10 DIAGNOSIS — D649 Anemia, unspecified: Secondary | ICD-10-CM | POA: Insufficient documentation

## 2013-06-10 LAB — PREPARE RBC (CROSSMATCH)

## 2013-06-10 NOTE — Progress Notes (Signed)
Lunch give. Ate well.

## 2013-06-10 NOTE — Progress Notes (Signed)
Blood infusing at 50 ml/hr without difficulty via pump.

## 2013-06-10 NOTE — Progress Notes (Signed)
#  1 unit PRBC's completed. Iv line flushed with saline. Tolerated well.

## 2013-06-10 NOTE — Progress Notes (Signed)
Here for blood transfusion. Daughter at side. Pt and daughter stated okay to infuse blood in right arm.

## 2013-06-10 NOTE — Progress Notes (Signed)
Daughter here. D/C to home in good condition.

## 2013-06-11 LAB — TYPE AND SCREEN
Antibody Screen: NEGATIVE
Unit division: 0

## 2013-06-11 NOTE — Progress Notes (Signed)
Late entry: tx for anemia due to chronic kidney dx, dialysis. Dr. Lowell Guitar

## 2013-06-14 ENCOUNTER — Encounter (HOSPITAL_COMMUNITY): Payer: Self-pay | Admitting: *Deleted

## 2013-06-14 ENCOUNTER — Emergency Department (HOSPITAL_COMMUNITY): Payer: Medicare Other

## 2013-06-14 ENCOUNTER — Emergency Department (HOSPITAL_COMMUNITY)
Admission: EM | Admit: 2013-06-14 | Discharge: 2013-06-15 | Disposition: A | Discharge status: Home / Self Care | Payer: Medicare Other | Attending: Emergency Medicine | Admitting: Emergency Medicine

## 2013-06-14 DIAGNOSIS — E871 Hypo-osmolality and hyponatremia: Secondary | ICD-10-CM

## 2013-06-14 DIAGNOSIS — E785 Hyperlipidemia, unspecified: Secondary | ICD-10-CM | POA: Insufficient documentation

## 2013-06-14 DIAGNOSIS — R05 Cough: Secondary | ICD-10-CM | POA: Insufficient documentation

## 2013-06-14 DIAGNOSIS — Z992 Dependence on renal dialysis: Secondary | ICD-10-CM | POA: Insufficient documentation

## 2013-06-14 DIAGNOSIS — K8020 Calculus of gallbladder without cholecystitis without obstruction: Secondary | ICD-10-CM | POA: Insufficient documentation

## 2013-06-14 DIAGNOSIS — I120 Hypertensive chronic kidney disease with stage 5 chronic kidney disease or end stage renal disease: Secondary | ICD-10-CM | POA: Insufficient documentation

## 2013-06-14 DIAGNOSIS — R0609 Other forms of dyspnea: Secondary | ICD-10-CM | POA: Insufficient documentation

## 2013-06-14 DIAGNOSIS — R0989 Other specified symptoms and signs involving the circulatory and respiratory systems: Secondary | ICD-10-CM | POA: Insufficient documentation

## 2013-06-14 DIAGNOSIS — I739 Peripheral vascular disease, unspecified: Secondary | ICD-10-CM | POA: Insufficient documentation

## 2013-06-14 DIAGNOSIS — N186 End stage renal disease: Secondary | ICD-10-CM

## 2013-06-14 DIAGNOSIS — M1990 Unspecified osteoarthritis, unspecified site: Secondary | ICD-10-CM | POA: Insufficient documentation

## 2013-06-14 DIAGNOSIS — J189 Pneumonia, unspecified organism: Secondary | ICD-10-CM

## 2013-06-14 DIAGNOSIS — Z8669 Personal history of other diseases of the nervous system and sense organs: Secondary | ICD-10-CM | POA: Insufficient documentation

## 2013-06-14 DIAGNOSIS — Z951 Presence of aortocoronary bypass graft: Secondary | ICD-10-CM | POA: Insufficient documentation

## 2013-06-14 DIAGNOSIS — R0789 Other chest pain: Secondary | ICD-10-CM | POA: Insufficient documentation

## 2013-06-14 DIAGNOSIS — R509 Fever, unspecified: Secondary | ICD-10-CM | POA: Insufficient documentation

## 2013-06-14 DIAGNOSIS — H9190 Unspecified hearing loss, unspecified ear: Secondary | ICD-10-CM | POA: Insufficient documentation

## 2013-06-14 DIAGNOSIS — Z8719 Personal history of other diseases of the digestive system: Secondary | ICD-10-CM | POA: Insufficient documentation

## 2013-06-14 DIAGNOSIS — Z862 Personal history of diseases of the blood and blood-forming organs and certain disorders involving the immune mechanism: Secondary | ICD-10-CM | POA: Insufficient documentation

## 2013-06-14 DIAGNOSIS — Z87448 Personal history of other diseases of urinary system: Secondary | ICD-10-CM | POA: Insufficient documentation

## 2013-06-14 DIAGNOSIS — Z8711 Personal history of peptic ulcer disease: Secondary | ICD-10-CM | POA: Insufficient documentation

## 2013-06-14 DIAGNOSIS — D649 Anemia, unspecified: Secondary | ICD-10-CM

## 2013-06-14 DIAGNOSIS — M109 Gout, unspecified: Secondary | ICD-10-CM | POA: Insufficient documentation

## 2013-06-14 DIAGNOSIS — Z8551 Personal history of malignant neoplasm of bladder: Secondary | ICD-10-CM | POA: Insufficient documentation

## 2013-06-14 DIAGNOSIS — R059 Cough, unspecified: Secondary | ICD-10-CM | POA: Insufficient documentation

## 2013-06-14 DIAGNOSIS — Z87891 Personal history of nicotine dependence: Secondary | ICD-10-CM | POA: Insufficient documentation

## 2013-06-14 DIAGNOSIS — J159 Unspecified bacterial pneumonia: Secondary | ICD-10-CM | POA: Insufficient documentation

## 2013-06-14 DIAGNOSIS — Z7982 Long term (current) use of aspirin: Secondary | ICD-10-CM | POA: Insufficient documentation

## 2013-06-14 DIAGNOSIS — K219 Gastro-esophageal reflux disease without esophagitis: Secondary | ICD-10-CM | POA: Insufficient documentation

## 2013-06-14 DIAGNOSIS — Z85528 Personal history of other malignant neoplasm of kidney: Secondary | ICD-10-CM | POA: Insufficient documentation

## 2013-06-14 DIAGNOSIS — Z79899 Other long term (current) drug therapy: Secondary | ICD-10-CM | POA: Insufficient documentation

## 2013-06-14 DIAGNOSIS — Z9889 Other specified postprocedural states: Secondary | ICD-10-CM | POA: Insufficient documentation

## 2013-06-14 DIAGNOSIS — Z8679 Personal history of other diseases of the circulatory system: Secondary | ICD-10-CM | POA: Insufficient documentation

## 2013-06-14 LAB — BASIC METABOLIC PANEL
BUN: 47 mg/dL — ABNORMAL HIGH (ref 6–23)
CO2: 34 mEq/L — ABNORMAL HIGH (ref 19–32)
Calcium: 9.3 mg/dL (ref 8.4–10.5)
Chloride: 87 mEq/L — ABNORMAL LOW (ref 96–112)
Creatinine, Ser: 6.25 mg/dL — ABNORMAL HIGH (ref 0.50–1.35)
GFR calc Af Amer: 8 mL/min — ABNORMAL LOW (ref >90)
GFR calc non Af Amer: 7 mL/min — ABNORMAL LOW (ref >90)
Glucose, Bld: 107 mg/dL — ABNORMAL HIGH (ref 70–99)
Potassium: 4.7 mEq/L (ref 3.5–5.1)
Sodium: 134 mEq/L — ABNORMAL LOW (ref 135–145)

## 2013-06-14 LAB — CBC
HCT: 27.1 % — ABNORMAL LOW (ref 39.0–52.0)
Hemoglobin: 9 g/dL — ABNORMAL LOW (ref 13.0–17.0)
MCH: 38.5 pg — ABNORMAL HIGH (ref 26.0–34.0)
MCHC: 33.2 g/dL (ref 30.0–36.0)
MCV: 115.8 fL — ABNORMAL HIGH (ref 78.0–100.0)
Platelets: 168 10*3/uL (ref 150–400)
RBC: 2.34 MIL/uL — ABNORMAL LOW (ref 4.22–5.81)
RDW: 22 % — ABNORMAL HIGH (ref 11.5–15.5)
WBC: 6.2 10*3/uL (ref 4.0–10.5)

## 2013-06-14 LAB — TROPONIN I: Troponin I: <0.3 ng/mL (ref <0.30)

## 2013-06-14 MED ORDER — DEXTROSE 5 % IV SOLN
500.0000 mg | Freq: Once | INTRAVENOUS | Status: AC
  Administered 2013-06-15: 500 mg via INTRAVENOUS
  Filled 2013-06-14: qty 500

## 2013-06-14 MED ORDER — CEFTRIAXONE SODIUM 1 G IJ SOLR
1.0000 g | Freq: Once | INTRAMUSCULAR | Status: AC
  Administered 2013-06-14: 1 g via INTRAVENOUS
  Filled 2013-06-14: qty 10

## 2013-06-14 NOTE — ED Notes (Signed)
Per family, pt has been having increasing SOB for several weeks.  Family states "He usually gets short of breath on Sundays before dialysis."  Pt has dialysis MWF.  Pt does have some dyspena with exertion.  C/o pain in center of chest.  No nausea at this time.

## 2013-06-14 NOTE — ED Notes (Signed)
Pt c/o sob and chest pain since mid day. Pt describes chest pain as centralized, constant chest pain that radiates to both arms. Pt has not taken anything at home for this.

## 2013-06-14 NOTE — ED Provider Notes (Signed)
History    CSN: [id number] Arrival date & time 06/14/13  2125  First MD Initiated Contact with Patient 06/14/13 2208     Chief Complaint  Patient presents with  . Shortness of Breath  . Chest Pain   (Consider location/radiation/quality/duration/timing/severity/associated sxs/prior Treatment) Patient is a 79 y.o. male presenting with shortness of breath and chest pain. The history is provided by the patient.  Shortness of Breath Associated symptoms: chest pain   Chest Pain Associated symptoms: shortness of breath   He has been having dyspnea for about the last week which is getting worse. He got significantly worse today. Today, he developed some pain across his upper chest. He describes it as a dull pain and he rated it at 8/10 at its worst it is currently 7/10. Pain is not affected by anything he does. Dyspnea is worse with exertion and with lying flat. There has been a cough which has been nonproductive. He has had chills and subjective fever but no sweats. There's been no nausea or vomiting. He has not done anything to try and treat the symptoms. Past Medical History  Diagnosis Date  . Secondary hyperparathyroidism   . Peripheral vascular disease   . Hyperlipidemia   . Duodenal ulcer     remote  . Gout STABLE  . Arteriosclerotic cardiovascular disease (ASCVD) 1998    CABG-1998; h/o CHF; Atherosclerosis of great vessels, aorta and coronaries on CT in 02/2012  . Cerebrovascular disease   . Neuropathy of foot   . Degenerative joint disease   . Gastroesophageal reflux disease   . History of bladder cancer   . Impaired hearing     Bilateral; hearing aids relatively ineffective  . Anemia     Prior blood transfusion  . Hypertension   . Cholelithiasis 03/2011    Diagnosed incidentally on abdominal ultrasound in 03/2011  . Hepatic steatosis   . Macrocytosis 10/20/2012  . Thrombocytopenia 10/20/2012  . Gallstones   . ESRD (end stage renal disease)     Hemodialysis since 2003;  Nephrologist-Dr. Caryn Section  . Renal cell carcinoma 2001    s/p right nephrectomy-2001; subsequent ESRD  . ESRD (end stage renal disease) on dialysis 08/20/2012   Past Surgical History  Procedure Laterality Date  . Right nephrectomy  2001    Renal cell carcinoma  . Colonoscopy  01/24/2011    prominent vascular pattern, suboptimal prep but doable  . Esophagogastroduodenoscopy  01/24/2011    mild erosive reflux esophagitis, bulbar/antral erosions, bx from antrum benign  . Av fistula repair  2008    revision of anastomosis of Right AVF  . Arteriovenous graft placement  2006    Thrombectomy and interposition jump graft revision to higher axillary vein of LUA AVG  . Coronary artery bypass graft  1998    X4 VESSELS  . Multiple cysto/ resection tumor's with bx's  LAST ONE 05-09-2011  . Multiple surg's / interventions for avgg/ fistula right upper arm  LAST REVISION 06-29-2011    (JUNE 2010 STENT CEPHALIC VEIN/ 03-23-2011 DILATATION ANGIOPLASTY)  . Left ptc left renal artery    . Cardiac catheterization  2004  . Cataract extraction w/ intraocular lens  implant, bilateral    . Cystoscopy w/ retrogrades  12/28/2011    Procedure: CYSTOSCOPY WITH RETROGRADE PYELOGRAM;  Surgeon: [name], MD;  Location: [facility];  Service: Urology;  Laterality: Left;  C-ARM   . Transurethral resection of bladder tumor  12/28/2011    Procedure: TRANSURETHRAL RESECTION OF  BLADDER TUMOR (TURBT);  Surgeon: [name], MD;  Location: [facility];  Service: Urology;  Laterality: N/A;  . Av fistula placement  02/29/2012    Procedure: INSERTION OF ARTERIOVENOUS (AV) GORE-TEX GRAFT THIGH;  Surgeon: [name], MD;  Location: [facility] OR;  Service: Vascular;  Laterality: Right;  Insertion right femoral arteriovenous gortex graft  . Ligation of right braciocephalic av fistula  04-04-2012  . Insertion of dialysis catheter  06/05/2012    Procedure: INSERTION OF DIALYSIS CATHETER;  Surgeon: Pryor Ochoa,  MD;  Location: [facility] OR;  Service: Vascular;  Laterality: Left;  Insertion diatek catheter left IJ  . Insertion of dialysis catheter  08/22/2012    Procedure: INSERTION OF DIALYSIS CATHETER;  Surgeon: Nada Libman, MD;  Location: [facility];  Service: Vascular;  Laterality: Left;  insertion of dialysis catheter left  internal jugular 27cm  . Av fistula placement  10/08/2012    Procedure: INSERTION OF ARTERIOVENOUS (AV) GORE-TEX GRAFT THIGH;  Surgeon: Chuck Hint, MD;  Location: [facility] OR;  Service: Vascular;  Laterality: Left;   Family History  Problem Relation Age of Onset  . Colon cancer Neg Hx   . Liver disease Neg Hx   . Anesthesia problems Neg Hx   . Heart disease Father    History  Substance Use Topics  . Smoking status: Former Smoker -- 1.00 packs/day for 25 years    Types: Cigarettes    Quit date: 02/26/1984  . Smokeless tobacco: Former Neurosurgeon     Comment: quit 1985  . Alcohol Use: No    Review of Systems  Respiratory: Positive for shortness of breath.   Cardiovascular: Positive for chest pain.  All other systems reviewed and are negative.    Allergies  Review of patient's allergies indicates no known allergies.  Home Medications   Current Outpatient Rx  Name  Route  Sig  Dispense  Refill  . albuterol (PROVENTIL HFA;VENTOLIN HFA) 108 (90 BASE) MCG/ACT inhaler   Inhalation   Inhale 2 puffs into the lungs every 6 (six) hours as needed for wheezing or shortness of breath.         . allopurinol (ZYLOPRIM) 100 MG tablet   Oral   Take 100 mg by mouth at bedtime.          Marland Kitchen aspirin EC 81 MG tablet   Oral   Take 81 mg by mouth at bedtime.          Marland Kitchen atorvastatin (LIPITOR) 10 MG tablet   Oral   Take 10 mg by mouth every morning.          . B Complex-C-Folic Acid (DIALYVITE TABLET) TABS   Oral   Take 1 tablet by mouth at bedtime.          . cinacalcet (SENSIPAR) 30 MG tablet   Oral   Take 30 mg by mouth daily with breakfast.          .  colchicine (COLCRYS) 0.6 MG tablet   Oral   Take 0.6 mg by mouth 2 (two) times daily as needed. for gout         . gabapentin (NEURONTIN) 100 MG capsule   Oral   Take 100 mg by mouth 2 (two) times daily.         . midodrine (PROAMATINE) 10 MG tablet   Oral   Take 1 tablet (10 mg total) by mouth every Monday, Wednesday, and Friday with hemodialysis.  30 tablet   3   . omeprazole (PRILOSEC) 40 MG capsule   Oral   Take 40 mg by mouth at bedtime. For reflux         . polyethylene glycol powder (MIRALAX) powder   Oral   Take 17 g by mouth at bedtime.          . sevelamer (RENVELA) 800 MG tablet   Oral   Take 1,600-3,200 mg by mouth 5 (five) times daily. 4 tabs 3 x daily and 2 with snack         . acetaminophen (TYLENOL) 500 MG tablet   Oral   Take 1,000 mg by mouth every 6 (six) hours as needed. For pain/headache         . calcium carbonate (TUMS - DOSED IN MG ELEMENTAL CALCIUM) 500 MG chewable tablet   Oral   Chew 1 tablet by mouth daily as needed. For upset stomach         . Vitamin D, Ergocalciferol, (DRISDOL) 50000 UNITS CAPS      50,000 Units every 7 (seven) days.           BP 132/93  Pulse 103  Temp(Src) 100.1 F (37.8 C) (Oral)  Resp 22  SpO2 100% Physical Exam  Nursing note and vitals reviewed.  79 year old male, resting comfortably and in no acute distress. Vital signs are significant for mild hypertension with blood pressure 132/93, and tachycardia with heart rate 103, and tachypnea with respiratory rate of 22. He typically does not have a fever but temperature is 100.1. Oxygen saturation is 100%, which is normal. Head is normocephalic and atraumatic. PERRLA, EOMI. Oropharynx is clear. Neck is nontender and supple without adenopathy or JVD. Back is nontender and there is no CVA tenderness. Lungs have bibasilar rales which are louder on the right. There no wheezes or rhonchi. Chest is nontender. Heart has regular rate and rhythm with 3/6  harsh systolic ejection murmur best heard at the cardiac apex. Abdomen is soft, flat, nontender without masses or hepatosplenomegaly and peristalsis is normoactive. Extremities have no cyanosis or edema, full range of motion is present. Skin is warm and dry without rash. Neurologic: Mental status is normal, cranial nerves are intact, there are no motor or sensory deficits.  ED Course  Procedures (including critical care time) Labs Reviewed  CBC - Abnormal; Notable for the following:    RBC 2.34 (*)    Hemoglobin 9.0 (*)    HCT 27.1 (*)    MCV 115.8 (*)    MCH 38.5 (*)    RDW 22.0 (*)    All other components within normal limits  BASIC METABOLIC PANEL - Abnormal; Notable for the following:    Sodium 134 (*)    Chloride 87 (*)    CO2 34 (*)    Glucose, Bld 107 (*)    BUN 47 (*)    Creatinine, Ser 6.25 (*)    GFR calc non Af Amer 7 (*)    GFR calc Af Amer 8 (*)    All other components within normal limits  TROPONIN I   Dg Chest Port 1 View  06/14/2013   *RADIOLOGY REPORT*  Clinical Data: Short of breath.  Chest discomfort.  PORTABLE CHEST - 1 VIEW  Comparison: 04/25/2013  Findings: There is some mild patchy airspace and coarse reticular opacity in the right lung base.  This could reflect an infiltrate. It may reflect asymmetric edema.  The lungs otherwise clear.  No  pleural effusion or pneumothorax.  The cardiac silhouette is normal in size and configuration. Changes from CABG surgery are stable.  No mediastinal or hilar masses.  IMPRESSION: Mild opacity at the right lung base suggest a small area of infiltrate or asymmetric edema.  It is new from the prior study.   Original Report Authenticated By: Amie Portland, M.D.   Image viewed by me.   Date: 06/14/2013  Rate: 117  Rhythm: sinus tachycardia, premature atrial contractions (PAC) and premature ventricular contractions (PVC)  QRS Axis: normal  Intervals: normal  ST/T Wave abnormalities: ST depression in the inferior and  anterolateral leads, T wave inversion in the lateral leads  Conduction Disutrbances:none  Narrative Interpretation: Sinus tachycardia, PACs, PVCs, ST and T changes with indicating possible ischemia. When compared with ECG of 10/06/2012, heart rate has increased, PVCs and PACs are now present, ST and T changes are now present.  Old EKG Reviewed: changes noted   1. Community acquired pneumonia   2. End stage renal disease on dialysis   3. Hyponatremia   4. Anemia     MDM  Dyspnea and chest pain which could be a cardiac but could be infectious. ECG is worrisome for ischemia, but troponin is normal. BNP level will be checked. Chest x-ray shows a suggestion of right basilar infiltrate and he'll be started on antibiotics for presumptive community-acquired pneumonia.  Lab work is unremarkable. History of pancytopenia but has inadequate WBC today. He does not appear toxic and is maintaining good oxygen saturations. He is in no distress whatsoever. I feel he can be safely managed as an outpatient. He is sent home with prescription for levofloxacin and he is to go for his dialysis as scheduled. Levofloxacin dose is adjusted for dialysis.  Dione Booze, MD 06/15/13 [phone]

## 2013-06-15 LAB — PRO B NATRIURETIC PEPTIDE: Pro B Natriuretic peptide (BNP): 25066 pg/mL — ABNORMAL HIGH (ref 0–450)

## 2013-06-15 MED ORDER — LEVOFLOXACIN 250 MG PO TABS: 250.0000 mg | ORAL_TABLET | ORAL | Status: DC

## 2013-06-15 MED ORDER — LEVOFLOXACIN 500 MG PO TABS
500.0000 mg | ORAL_TABLET | Freq: Once | ORAL | Status: AC
  Administered 2013-06-15: 500 mg via ORAL
  Filled 2013-06-15: qty 1

## 2013-06-19 ENCOUNTER — Other Ambulatory Visit (HOSPITAL_COMMUNITY): Payer: Self-pay | Admitting: Family Medicine

## 2013-06-19 ENCOUNTER — Ambulatory Visit (HOSPITAL_COMMUNITY)
Admission: RE | Admit: 2013-06-19 | Discharge: 2013-06-19 | Disposition: A | Discharge status: Home / Self Care | Payer: Medicare Other | Source: Ambulatory Visit | Attending: Family Medicine | Admitting: Family Medicine

## 2013-06-19 DIAGNOSIS — R05 Cough: Secondary | ICD-10-CM | POA: Insufficient documentation

## 2013-06-19 DIAGNOSIS — Z87891 Personal history of nicotine dependence: Secondary | ICD-10-CM | POA: Insufficient documentation

## 2013-06-19 DIAGNOSIS — R0602 Shortness of breath: Secondary | ICD-10-CM | POA: Insufficient documentation

## 2013-06-19 DIAGNOSIS — R059 Cough, unspecified: Secondary | ICD-10-CM | POA: Insufficient documentation

## 2013-06-19 DIAGNOSIS — J158 Pneumonia due to other specified bacteria: Secondary | ICD-10-CM

## 2013-06-23 ENCOUNTER — Ambulatory Visit (INDEPENDENT_AMBULATORY_CARE_PROVIDER_SITE_OTHER): Payer: Medicare Other | Admitting: Adult Health

## 2013-06-23 ENCOUNTER — Inpatient Hospital Stay (HOSPITAL_COMMUNITY): Admission: RE | Admit: 2013-06-23 | Payer: Medicare Other | Source: Ambulatory Visit

## 2013-06-23 ENCOUNTER — Encounter: Payer: Self-pay | Admitting: Adult Health

## 2013-06-23 ENCOUNTER — Encounter (HOSPITAL_COMMUNITY)
Admission: RE | Admit: 2013-06-23 | Discharge: 2013-06-23 | Disposition: A | Discharge status: Home / Self Care | Payer: Medicare Other | Source: Ambulatory Visit | Attending: Nephrology | Admitting: Nephrology

## 2013-06-23 ENCOUNTER — Other Ambulatory Visit (HOSPITAL_COMMUNITY): Payer: Medicare Other

## 2013-06-23 ENCOUNTER — Encounter (HOSPITAL_COMMUNITY): Payer: Medicare Other

## 2013-06-23 VITALS — BP 98/49 | HR 73 | Ht 68.0 in | Wt 174.2 lb

## 2013-06-23 DIAGNOSIS — J9600 Acute respiratory failure, unspecified whether with hypoxia or hypercapnia: Secondary | ICD-10-CM | POA: Diagnosis present

## 2013-06-23 DIAGNOSIS — Z992 Dependence on renal dialysis: Secondary | ICD-10-CM | POA: Diagnosis present

## 2013-06-23 DIAGNOSIS — Z951 Presence of aortocoronary bypass graft: Secondary | ICD-10-CM | POA: Diagnosis present

## 2013-06-23 DIAGNOSIS — I2510 Atherosclerotic heart disease of native coronary artery without angina pectoris: Secondary | ICD-10-CM | POA: Diagnosis present

## 2013-06-23 DIAGNOSIS — J189 Pneumonia, unspecified organism: Secondary | ICD-10-CM | POA: Diagnosis present

## 2013-06-23 DIAGNOSIS — I739 Peripheral vascular disease, unspecified: Secondary | ICD-10-CM | POA: Diagnosis present

## 2013-06-23 DIAGNOSIS — I5043 Acute on chronic combined systolic (congestive) and diastolic (congestive) heart failure: Secondary | ICD-10-CM | POA: Diagnosis present

## 2013-06-23 DIAGNOSIS — I10 Essential (primary) hypertension: Secondary | ICD-10-CM

## 2013-06-23 DIAGNOSIS — J4489 Other specified chronic obstructive pulmonary disease: Secondary | ICD-10-CM | POA: Diagnosis present

## 2013-06-23 DIAGNOSIS — G609 Hereditary and idiopathic neuropathy, unspecified: Secondary | ICD-10-CM | POA: Diagnosis present

## 2013-06-23 DIAGNOSIS — D61818 Other pancytopenia: Secondary | ICD-10-CM | POA: Diagnosis present

## 2013-06-23 DIAGNOSIS — M109 Gout, unspecified: Secondary | ICD-10-CM | POA: Diagnosis present

## 2013-06-23 DIAGNOSIS — H9190 Unspecified hearing loss, unspecified ear: Secondary | ICD-10-CM | POA: Diagnosis present

## 2013-06-23 DIAGNOSIS — Z79899 Other long term (current) drug therapy: Secondary | ICD-10-CM

## 2013-06-23 DIAGNOSIS — N186 End stage renal disease: Secondary | ICD-10-CM

## 2013-06-23 DIAGNOSIS — Z87891 Personal history of nicotine dependence: Secondary | ICD-10-CM

## 2013-06-23 DIAGNOSIS — J449 Chronic obstructive pulmonary disease, unspecified: Secondary | ICD-10-CM | POA: Diagnosis present

## 2013-06-23 DIAGNOSIS — I2789 Other specified pulmonary heart diseases: Secondary | ICD-10-CM | POA: Diagnosis present

## 2013-06-23 DIAGNOSIS — A419 Sepsis, unspecified organism: Principal | ICD-10-CM | POA: Diagnosis present

## 2013-06-23 DIAGNOSIS — Z7982 Long term (current) use of aspirin: Secondary | ICD-10-CM

## 2013-06-23 DIAGNOSIS — Z8551 Personal history of malignant neoplasm of bladder: Secondary | ICD-10-CM | POA: Diagnosis present

## 2013-06-23 DIAGNOSIS — Z905 Acquired absence of kidney: Secondary | ICD-10-CM

## 2013-06-23 DIAGNOSIS — N2581 Secondary hyperparathyroidism of renal origin: Secondary | ICD-10-CM | POA: Diagnosis present

## 2013-06-23 DIAGNOSIS — D649 Anemia, unspecified: Secondary | ICD-10-CM

## 2013-06-23 DIAGNOSIS — I509 Heart failure, unspecified: Secondary | ICD-10-CM | POA: Diagnosis present

## 2013-06-23 DIAGNOSIS — I120 Hypertensive chronic kidney disease with stage 5 chronic kidney disease or end stage renal disease: Secondary | ICD-10-CM | POA: Diagnosis present

## 2013-06-23 DIAGNOSIS — Z85528 Personal history of other malignant neoplasm of kidney: Secondary | ICD-10-CM | POA: Diagnosis present

## 2013-06-23 DIAGNOSIS — E785 Hyperlipidemia, unspecified: Secondary | ICD-10-CM | POA: Diagnosis present

## 2013-06-23 LAB — HEMOGLOBIN AND HEMATOCRIT, BLOOD: Hemoglobin: 7.3 g/dL — ABNORMAL LOW (ref 13.0–17.0)

## 2013-06-23 LAB — PREPARE RBC (CROSSMATCH)

## 2013-06-23 MED ORDER — ALBUTEROL SULFATE HFA 108 (90 BASE) MCG/ACT IN AERS: 2.0000 | INHALATION_SPRAY | Freq: Four times a day (QID) | RESPIRATORY_TRACT | Status: AC | PRN

## 2013-06-23 NOTE — Assessment & Plan Note (Signed)
Concerns that anemia may cause arrhythmia, Afib vs ventricular irregularity. Will need to be watched for this. EKG NSR with PAC's. No complaints of chest pain. Will follow. No changes in medications. No planned testing.

## 2013-06-23 NOTE — Progress Notes (Signed)
HPI: [name] is a 79 y/o patient of Dr. Dietrich Pates we are following with known history of IJ thrombus. He was last seen in the office in October of 2013 prior to AV fistula access. He was seen in the ER for SOB and chest pain on 06/14/2013 He was found to be febrile with temp of 101. He was diagnosed with community acquired pneumonia. Most recent echocardiogram in 2013 demonstrated There was mild concentric hypertrophy. Probable akinesis of the basal inferolateral myocardium. Other segments were hyperdynamic. EF exceeded 65%.   He comes today still having shortness of breath. He was found to be anemic in ER with Hgb of 9.0. He states he will be receiving a blood transfusion tomorrow. He has a history of hypotension and is taking midodrine before dialysis. He is due for dialysis today.    No Known Allergies  Current Outpatient Prescriptions  Medication Sig Dispense Refill  . acetaminophen (TYLENOL) 500 MG tablet Take 1,000 mg by mouth every 6 (six) hours as needed. For pain/headache      . albuterol (PROVENTIL HFA;VENTOLIN HFA) 108 (90 BASE) MCG/ACT inhaler Inhale 2 puffs into the lungs every 6 (six) hours as needed for wheezing or shortness of breath.  1 Inhaler  0  . allopurinol (ZYLOPRIM) 100 MG tablet Take 100 mg by mouth at bedtime.       Marland Kitchen aspirin EC 81 MG tablet Take 81 mg by mouth at bedtime.       Marland Kitchen atorvastatin (LIPITOR) 10 MG tablet Take 10 mg by mouth every morning.       . B Complex-C-Folic Acid (DIALYVITE TABLET) TABS Take 1 tablet by mouth at bedtime.       . calcium carbonate (TUMS - DOSED IN MG ELEMENTAL CALCIUM) 500 MG chewable tablet Chew 1 tablet by mouth daily as needed. For upset stomach      . cinacalcet (SENSIPAR) 30 MG tablet Take 30 mg by mouth daily with breakfast.       . colchicine (COLCRYS) 0.6 MG tablet Take 0.6 mg by mouth 2 (two) times daily as needed. for gout      . gabapentin (NEURONTIN) 100 MG capsule Take 100 mg by mouth 2 (two) times daily.      . midodrine  (PROAMATINE) 10 MG tablet Take 1 tablet (10 mg total) by mouth every Monday, Wednesday, and Friday with hemodialysis.  30 tablet  3  . omeprazole (PRILOSEC) 40 MG capsule Take 40 mg by mouth at bedtime. For reflux      . polyethylene glycol powder (MIRALAX) powder Take 17 g by mouth at bedtime.       . sevelamer (RENVELA) 800 MG tablet Take 1,600-3,200 mg by mouth 5 (five) times daily. 4 tabs 3 x daily and 2 with snack      . Vitamin D, Ergocalciferol, (DRISDOL) 50000 UNITS CAPS 50,000 Units every 7 (seven) days.        No current facility-administered medications for this visit.   Facility-Administered Medications Ordered in Other Visits  Medication Dose Route Frequency Provider Last Rate Last Dose  . albuterol (PROVENTIL) (5 MG/ML) 0.5% nebulizer solution 2.5 mg  2.5 mg Nebulization Q2H PRN Maree Krabbe, MD      . methylPREDNISolone sodium succinate (SOLU-MEDROL) 125 mg/2 mL injection 60 mg  60 mg Intravenous Once Maree Krabbe, MD        Past Medical History  Diagnosis Date  . Secondary hyperparathyroidism   . Peripheral vascular disease   .  Hyperlipidemia   . Duodenal ulcer     remote  . Gout STABLE  . Arteriosclerotic cardiovascular disease (ASCVD) 1998    CABG-1998; h/o CHF; Atherosclerosis of great vessels, aorta and coronaries on CT in 02/2012  . Cerebrovascular disease   . Neuropathy of foot   . Degenerative joint disease   . Gastroesophageal reflux disease   . History of bladder cancer   . Impaired hearing     Bilateral; hearing aids relatively ineffective  . Anemia     Prior blood transfusion  . Hypertension   . Cholelithiasis 03/2011    Diagnosed incidentally on abdominal ultrasound in 03/2011  . Hepatic steatosis   . Macrocytosis 10/20/2012  . Thrombocytopenia 10/20/2012  . Gallstones   . ESRD (end stage renal disease)     Hemodialysis since 2003; Nephrologist-Dr. Caryn Section  . Renal cell carcinoma 2001    s/p right nephrectomy-2001; subsequent ESRD  . ESRD (end  stage renal disease) on dialysis 08/20/2012    Past Surgical History  Procedure Laterality Date  . Right nephrectomy  2001    Renal cell carcinoma  . Colonoscopy  01/24/2011    prominent vascular pattern, suboptimal prep but doable  . Esophagogastroduodenoscopy  01/24/2011    mild erosive reflux esophagitis, bulbar/antral erosions, bx from antrum benign  . Av fistula repair  2008    revision of anastomosis of Right AVF  . Arteriovenous graft placement  2006    Thrombectomy and interposition jump graft revision to higher axillary vein of LUA AVG  . Coronary artery bypass graft  1998    X4 VESSELS  . Multiple cysto/ resection tumor's with bx's  LAST ONE 05-09-2011  . Multiple surg's / interventions for avgg/ fistula right upper arm  LAST REVISION 06-29-2011    (JUNE 2010 STENT CEPHALIC VEIN/ 03-23-2011 DILATATION ANGIOPLASTY)  . Left ptc left renal artery    . Cardiac catheterization  2004  . Cataract extraction w/ intraocular lens  implant, bilateral    . Cystoscopy w/ retrogrades  12/28/2011    Procedure: CYSTOSCOPY WITH RETROGRADE PYELOGRAM;  Surgeon: [name], MD;  Location: [facility];  Service: Urology;  Laterality: Left;  C-ARM   . Transurethral resection of bladder tumor  12/28/2011    Procedure: TRANSURETHRAL RESECTION OF BLADDER TUMOR (TURBT);  Surgeon: [name], MD;  [facility];  Service: Urology;  Laterality: N/A;  . Av fistula placement  02/29/2012    Procedure: INSERTION OF ARTERIOVENOUS (AV) GORE-TEX GRAFT THIGH;  Surgeon: [name], MD;  Location: [facility] OR;  Service: Vascular;  Laterality: Right;  Insertion right femoral arteriovenous gortex graft  . Ligation of right braciocephalic av fistula  04-04-2012  . Insertion of dialysis catheter  06/05/2012    Procedure: INSERTION OF DIALYSIS CATHETER;  Surgeon: Pryor Ochoa, MD;  Location: [facility] OR;  Service: Vascular;  Laterality: Left;  Insertion diatek catheter left IJ  . Insertion  of dialysis catheter  08/22/2012    Procedure: INSERTION OF DIALYSIS CATHETER;  Surgeon: Nada Libman, MD;  Location: [facility];  Service: Vascular;  Laterality: Left;  insertion of dialysis catheter left  internal jugular 27cm  . Av fistula placement  10/08/2012    Procedure: INSERTION OF ARTERIOVENOUS (AV) GORE-TEX GRAFT THIGH;  Surgeon: Chuck Hint, MD;  Location: [facility] OR;  Service: Vascular;  Laterality: Left;    AVW:UJWJXB of systems complete and found to be negative unless listed above  PHYSICAL EXAM BP  98/49  Pulse 73  Ht 5\' 8"  (1.727 m)  Wt 174 lb 4 oz (79.039 kg)  BMI 26.5 kg/m2  General: Well developed, well nourished, in no acute distress Head: Eyes PERRLA, No xanthomas.   Normal cephalic and atramatic  Lungs: Clear bilaterally to auscultation and percussion. Heart: HRRR S1 S2,1/6 systolic murmur. Pulses are 2+ & equal.            No carotid bruit. No JVD. Good thrill to fistula in the left leg.  Abdomen: Bowel sounds are positive, abdomen soft and non-tender without masses or                  Hernia's noted. Msk:  Back normal, normal gait. Normal strength and tone for age. Extremities: No clubbing, cyanosis or edema.  DP +1 Neuro: Alert and oriented X 3. Psych:  Good affect, responds appropriately  EKG: NSR with PAC's. Nonspecific lateral T-wave abnormalities.  ASSESSMENT AND PLAN

## 2013-06-23 NOTE — Assessment & Plan Note (Signed)
Hypotensive today. Not on antihypertensives at this time. Actually on midodrine to be taken prior to dialysis. Anemia may be playing into this recording.

## 2013-06-23 NOTE — Progress Notes (Addendum)
Results for ANTARIO, YASUDA (MRN [id number]) as of 06/23/2013 15:29 per Labs guidelines H/H prior to infusion  Ref. Range 06/23/2013 11:00  Hemoglobin Latest Range: 13.0-17.0 g/dL 7.3 (L)  HCT Latest Range: 39.0-52.0 % 22.0 (L)  Faxed to Drs. Fusco and [employer]

## 2013-06-23 NOTE — Assessment & Plan Note (Signed)
This is most likely related to his dyspnea, as well as continued recovery from community acquired pneumonia. He is due for transfusion tomorrow. Will defer further labs to nephrology as anemia related to CKD.

## 2013-06-23 NOTE — Progress Notes (Signed)
Labs drawn for Transfusion in am. Tolerated well. H/H done per lab protocol.

## 2013-06-23 NOTE — Assessment & Plan Note (Signed)
Dialysis is planned today.

## 2013-06-23 NOTE — Progress Notes (Deleted)
Name: Andrew [name]    DOB: January 13, 1926  Age: 79 y.o.  MR#: [id number]       PCP:  [name]., MD      Insurance: Payor: MEDICARE / Plan: MEDICARE PART A AND B / Product Type: *No Product type* /   CC:   No chief complaint on file.   VS Filed Vitals:   06/23/13 1126  BP: 98/49  Pulse: 73  Height: 5\' 8"  (1.727 m)  Weight: 174 lb 4 oz (79.039 kg)    Weights Current Weight  06/23/13 174 lb 4 oz (79.039 kg)  04/08/13 168 lb 14.4 oz (76.613 kg)  01/14/13 168 lb 4.8 oz (76.34 kg)    Blood Pressure  BP Readings from Last 3 Encounters:  06/23/13 98/49  06/15/13 132/93  06/10/13 126/72     Admit date:  (Not on file) Last encounter with RMR:  Visit date not found   Allergy Review of patient's allergies indicates no known allergies.  Current Outpatient Prescriptions  Medication Sig Dispense Refill  . acetaminophen (TYLENOL) 500 MG tablet Take 1,000 mg by mouth every 6 (six) hours as needed. For pain/headache      . albuterol (PROVENTIL HFA;VENTOLIN HFA) 108 (90 BASE) MCG/ACT inhaler Inhale 2 puffs into the lungs every 6 (six) hours as needed for wheezing or shortness of breath.      . allopurinol (ZYLOPRIM) 100 MG tablet Take 100 mg by mouth at bedtime.       Marland Kitchen aspirin EC 81 MG tablet Take 81 mg by mouth at bedtime.       Marland Kitchen atorvastatin (LIPITOR) 10 MG tablet Take 10 mg by mouth every morning.       . B Complex-C-Folic Acid (DIALYVITE TABLET) TABS Take 1 tablet by mouth at bedtime.       . calcium carbonate (TUMS - DOSED IN MG ELEMENTAL CALCIUM) 500 MG chewable tablet Chew 1 tablet by mouth daily as needed. For upset stomach      . cinacalcet (SENSIPAR) 30 MG tablet Take 30 mg by mouth daily with breakfast.       . colchicine (COLCRYS) 0.6 MG tablet Take 0.6 mg by mouth 2 (two) times daily as needed. for gout      . gabapentin (NEURONTIN) 100 MG capsule Take 100 mg by mouth 2 (two) times daily.      . midodrine (PROAMATINE) 10 MG tablet Take 1 tablet (10 mg total) by mouth  every Monday, Wednesday, and Friday with hemodialysis.  30 tablet  3  . omeprazole (PRILOSEC) 40 MG capsule Take 40 mg by mouth at bedtime. For reflux      . polyethylene glycol powder (MIRALAX) powder Take 17 g by mouth at bedtime.       . sevelamer (RENVELA) 800 MG tablet Take 1,600-3,200 mg by mouth 5 (five) times daily. 4 tabs 3 x daily and 2 with snack      . Vitamin D, Ergocalciferol, (DRISDOL) 50000 UNITS CAPS 50,000 Units every 7 (seven) days.        No current facility-administered medications for this visit.   Facility-Administered Medications Ordered in Other Visits  Medication Dose Route Frequency Provider Last Rate Last Dose  . albuterol (PROVENTIL) (5 MG/ML) 0.5% nebulizer solution 2.5 mg  2.5 mg Nebulization Q2H PRN Maree Krabbe, MD      . methylPREDNISolone sodium succinate (SOLU-MEDROL) 125 mg/2 mL injection 60 mg  60 mg Intravenous Once Maree Krabbe, MD  Discontinued Meds:    Medications Discontinued During This Encounter  Medication Reason  . levofloxacin (LEVAQUIN) 250 MG tablet Error    Patient Active Problem List   Diagnosis Date Noted  . Macrocytosis 10/20/2012  . Thrombocytopenia 10/20/2012  . Hyperkalemia 10/20/2012  . Delirium 10/19/2012  . Weakness generalized 10/19/2012  . Fall 10/19/2012  . Constipation 10/19/2012  . Anorexia 10/19/2012  . Pancytopenia 08/20/2012  . ESRD (end stage renal disease) on dialysis 08/20/2012  . CAD (coronary artery disease) 08/20/2012  . Peripheral vascular disease 08/06/2012  . Cerebrovascular disease 08/06/2012  . Jugular vein thrombosis, right 08/06/2012  . Peripheral neuropathy 01/09/2012  . Hypertension   . Hyperlipidemia   . ESRD (end stage renal disease)   . Renal cell carcinoma   . Arteriosclerotic cardiovascular disease (ASCVD) 09/17/2011  . ANEMIA 01/10/2011  . SECONDARY HYPERPARATHYROIDISM 05/26/2009    LABS    Component Value Date/Time   NA 134* 06/14/2013 2140   NA 138 12/10/2012 1330    NA 136 10/21/2012 0535   K 4.7 06/14/2013 2140   K 4.4 12/10/2012 1330   K 4.4 10/21/2012 0535   K 6.0 03/23/2011 0912   CL 87* 06/14/2013 2140   CL 98 12/10/2012 1330   CL 96 10/21/2012 0535   CO2 34* 06/14/2013 2140   CO2 29 10/21/2012 0535   CO2 30 10/20/2012 0516   GLUCOSE 107* 06/14/2013 2140   GLUCOSE 106* 12/10/2012 1330   GLUCOSE 86 10/21/2012 0535   BUN 47* 06/14/2013 2140   BUN 32* 12/10/2012 1330   BUN 32* 10/21/2012 0535   CREATININE 6.25* 06/14/2013 2140   CREATININE 6.00* 12/10/2012 1330   CREATININE 5.69* 10/21/2012 0535   CALCIUM 9.3 06/14/2013 2140   CALCIUM 9.3 10/21/2012 0535   CALCIUM 9.7 10/20/2012 0516   CALCIUM 7.8* 03/13/2012 0527   GFRNONAA 7* 06/14/2013 2140   GFRNONAA 8* 10/21/2012 0535   GFRNONAA 6* 10/20/2012 0516   GFRAA 8* 06/14/2013 2140   GFRAA 9* 10/21/2012 0535   GFRAA 6* 10/20/2012 0516   CMP     Component Value Date/Time   NA 134* 06/14/2013 2140   K 4.7 06/14/2013 2140   K 6.0 03/23/2011 0912   CL 87* 06/14/2013 2140   CO2 34* 06/14/2013 2140   GLUCOSE 107* 06/14/2013 2140   BUN 47* 06/14/2013 2140   CREATININE 6.25* 06/14/2013 2140   CALCIUM 9.3 06/14/2013 2140   CALCIUM 7.8* 03/13/2012 0527   PROT 6.3 10/19/2012 1827   ALBUMIN 3.0* 10/19/2012 1827   AST 25 10/19/2012 1827   ALT 16 10/19/2012 1827   ALKPHOS 160* 10/19/2012 1827   ALKPHOS 103 03/23/2011 0912   BILITOT 0.3 10/19/2012 1827   GFRNONAA 7* 06/14/2013 2140   GFRAA 8* 06/14/2013 2140       Component Value Date/Time   WBC 6.2 06/14/2013 2140   WBC 2.6* 04/08/2013 0945   WBC 3.6* 02/25/2013 1000   HGB 9.0* 06/14/2013 2140   HGB 6.9* 06/09/2013 1143   HGB 9.6* 04/08/2013 0945   HCT 27.1* 06/14/2013 2140   HCT 20.2* 06/09/2013 1143   HCT 30.0* 04/08/2013 0945   HCT 35 09/14/2011 1038   HCT 36 03/23/2011 0909   HCT 34 03/21/2011 0826   MCV 115.8* 06/14/2013 2140   MCV 114.9* 04/08/2013 0945   MCV 113.9* 02/25/2013 1000   MCV 98.0 09/14/2011 1038   MCV 102 03/23/2011 0909   MCV 102.0 03/21/2011  0826    Lipid Panel  Component Value Date/Time   CHOL 103 03/11/2012 0530   TRIG 117 03/11/2012 0530   HDL 39* 03/11/2012 0530   CHOLHDL 2.6 03/11/2012 0530   VLDL 23 03/11/2012 0530   LDLCALC 41 03/11/2012 0530    ABG    Component Value Date/Time   HCO3 29.7* 10/19/2012 1820   TCO2 32 12/10/2012 1330   O2SAT 75.7 10/19/2012 1820     Lab Results  Component Value Date   TSH 3.649 12/24/2012   BNP (last 3 results)  Recent Labs  06/14/13 2309  PROBNP 25066.0*   Cardiac Panel (last 3 results) No results found for this basename: CKTOTAL, CKMB, TROPONINI, RELINDX,  in the last 72 hours  Iron/TIBC/Ferritin    Component Value Date/Time   IRON 68 12/24/2012 1710   TIBC 276 12/24/2012 1710   FERRITIN 708* 12/24/2012 1710   FERRITIN 256.0 09/14/2011 1041     EKG Orders placed in visit on 06/23/13  . EKG 12-LEAD     Prior Assessment and Plan Problem List as of 06/23/2013   SECONDARY HYPERPARATHYROIDISM   ANEMIA   Last Assessment & Plan   01/09/2012 Office Visit Written 01/09/2012  5:25 PM by Kathlen Brunswick, MD     Anemia has virtually resolved.  The patient reports treatment with medication for anemia, but it is unclear whether this represents intravenous iron or an ESA.    Hypertension   Last Assessment & Plan   04/25/2012 Office Visit Written 04/25/2012  3:07 PM by Jodelle Gross, NP     Currently well controlled on medications prescribed. No changes at this time.    Hyperlipidemia   Last Assessment & Plan   01/09/2012 Office Visit Written 01/09/2012  5:26 PM by Kathlen Brunswick, MD     Laboratory study results requested from Dr. Lowell Guitar and the [facility].    ESRD (end stage renal disease)   Last Assessment & Plan   08/01/2012 Office Visit Written 08/01/2012 12:32 PM by Kathlen Brunswick, MD     Hypotension during dialysis has been somewhat problematic but not apparently insurmountable.  He is treated with ProAmatine but not with any medications that are likely to  cause reduced blood pressure.  Based upon his recent stress test, coronary artery disease should not be severe enough to be causing this problem.    Arteriosclerotic cardiovascular disease (ASCVD)   Last Assessment & Plan   08/01/2012 Office Visit Written 08/01/2012 12:30 PM by Kathlen Brunswick, MD     Mr. Swoyer continues to do generally well 15 years following CABG surgery.  Stress nuclear study 4 months ago was reassuring demonstrating fairly limited inferolateral infarction without significant superimposed ischemia and with mild impairment in ejection fraction.  This suggests an acceptable cardiovascular risk for noncardiac surgery, particularly the fairly modest vascular surgery procedure that has been proposed.    Renal cell carcinoma   Peripheral neuropathy   Last Assessment & Plan   08/01/2012 Office Visit Written 08/01/2012 12:33 PM by Kathlen Brunswick, MD     Occurrence of lower extremity pain in conjunction with dialysis and Trendelenburg positioning suggests a possible vascular etiology.  ABIs will be obtained.    Peripheral vascular disease   Cerebrovascular disease   Jugular vein thrombosis, right   Last Assessment & Plan   08/15/2012 Office Visit Edited 08/27/2012  7:48 PM by Kathlen Brunswick, MD     Discussed with Dr. Arrie Aran, who advises that the thrombus is likely chronic and organized  and related to central venous access previously placed in the right internal jugular.  Anticoagulation is not indicated, and significant morbidity is highly unlikely.  The patient is advised to contact vascular surgery to schedule procedure to create vascular access in the left lower extremity.  An appointment to return to see me as already scheduled for 6 months hence.    Pancytopenia   ESRD (end stage renal disease) on dialysis   CAD (coronary artery disease)   Delirium   Weakness generalized   Fall   Constipation   Anorexia   Macrocytosis   Thrombocytopenia   Hyperkalemia        Imaging: Dg Chest 2 View  06/19/2013   *RADIOLOGY REPORT*  Clinical Data: Shortness of breath.  Coughing.  Ex-smoker.  CHEST - 2 VIEW  Comparison: 03/24/2013.  06/14/2013.  Findings: Cardiac silhouette is borderline enlarged. The patient has undergone previous median sternotomy and coronary artery bypass grafting. Ectasia and nonaneurysmal calcification of the thoracic aorta are seen.  Mediastinal and hilar contours appear stable. Slight prominence of left hilum is felt to be vascular and unchanged.  No pulmonary infiltrative densities or masses are seen. There appears to be a right subclavian stent.  Surgical clips are seen in the left axillary region.  There is slight flattening of the diaphragm on lateral image which may reflect minimal hyperinflation.  There is minimal central peribronchial thickening. There is minimal degenerative spondylosis.  IMPRESSION: Borderline cardiac silhouette enlargement.  Slight flattening of diaphragm on lateral image may reflect minimal hyperinflation. There is minimal central peribronchial thickening.  No consolidation or pleural effusion.   Original Report Authenticated By: Onalee Hua Call   Dg Chest Port 1 View  06/14/2013   *RADIOLOGY REPORT*  Clinical Data: Short of breath.  Chest discomfort.  PORTABLE CHEST - 1 VIEW  Comparison: 04/25/2013  Findings: There is some mild patchy airspace and coarse reticular opacity in the right lung base.  This could reflect an infiltrate. It may reflect asymmetric edema.  The lungs otherwise clear.  No pleural effusion or pneumothorax.  The cardiac silhouette is normal in size and configuration. Changes from CABG surgery are stable.  No mediastinal or hilar masses.  IMPRESSION: Mild opacity at the right lung base suggest a small area of infiltrate or asymmetric edema.  It is new from the prior study.   Original Report Authenticated By: Amie Portland, M.D.

## 2013-06-23 NOTE — Patient Instructions (Addendum)
Your physician recommends that you schedule a follow-up appointment in: 6 MONTHS You will receive a reminder letter in the mail in about 4 months reminding you to call and schedule your appointment. If you don't receive this letter, please contact our office.  Your physician recommends that you continue on your current medications as directed. Please refer to the Current Medication list given to you today.  PLEASE BRING MEDICATION BOTTLES AT YOUR NEXT VISIT.

## 2013-06-24 ENCOUNTER — Encounter (HOSPITAL_COMMUNITY)
Admission: RE | Admit: 2013-06-24 | Discharge: 2013-06-24 | Disposition: A | Discharge status: Home / Self Care | Payer: Medicare Other | Source: Ambulatory Visit | Attending: Nephrology | Admitting: Nephrology

## 2013-06-24 NOTE — Progress Notes (Signed)
Transfusion completed @1215  tolerated well. Lunch provided. Patient states "feels stronger". D/C instructions reviewed with daughter and pt.

## 2013-06-25 LAB — TYPE AND SCREEN
ABO/RH(D): O POS
Unit division: 0

## 2013-06-26 ENCOUNTER — Emergency Department (HOSPITAL_COMMUNITY): Payer: Medicare Other

## 2013-06-26 ENCOUNTER — Encounter (HOSPITAL_COMMUNITY): Payer: Self-pay | Admitting: Emergency Medicine

## 2013-06-26 ENCOUNTER — Inpatient Hospital Stay (HOSPITAL_COMMUNITY)
Admission: EM | Admit: 2013-06-26 | Discharge: 2013-07-02 | DRG: 871 | Disposition: A | Discharge status: Home Health | Payer: Medicare Other | Attending: Internal Medicine | Admitting: Internal Medicine

## 2013-06-26 DIAGNOSIS — A419 Sepsis, unspecified organism: Principal | ICD-10-CM

## 2013-06-26 DIAGNOSIS — R509 Fever, unspecified: Secondary | ICD-10-CM

## 2013-06-26 DIAGNOSIS — I509 Heart failure, unspecified: Secondary | ICD-10-CM | POA: Diagnosis present

## 2013-06-26 DIAGNOSIS — D709 Neutropenia, unspecified: Secondary | ICD-10-CM

## 2013-06-26 DIAGNOSIS — D649 Anemia, unspecified: Secondary | ICD-10-CM

## 2013-06-26 DIAGNOSIS — I5021 Acute systolic (congestive) heart failure: Secondary | ICD-10-CM | POA: Diagnosis present

## 2013-06-26 DIAGNOSIS — R0902 Hypoxemia: Secondary | ICD-10-CM

## 2013-06-26 DIAGNOSIS — G629 Polyneuropathy, unspecified: Secondary | ICD-10-CM

## 2013-06-26 DIAGNOSIS — D61818 Other pancytopenia: Secondary | ICD-10-CM | POA: Diagnosis present

## 2013-06-26 DIAGNOSIS — I2510 Atherosclerotic heart disease of native coronary artery without angina pectoris: Secondary | ICD-10-CM | POA: Diagnosis present

## 2013-06-26 DIAGNOSIS — N2581 Secondary hyperparathyroidism of renal origin: Secondary | ICD-10-CM | POA: Diagnosis present

## 2013-06-26 DIAGNOSIS — J189 Pneumonia, unspecified organism: Secondary | ICD-10-CM

## 2013-06-26 DIAGNOSIS — N186 End stage renal disease: Secondary | ICD-10-CM

## 2013-06-26 DIAGNOSIS — R531 Weakness: Secondary | ICD-10-CM

## 2013-06-26 DIAGNOSIS — D630 Anemia in neoplastic disease: Secondary | ICD-10-CM | POA: Diagnosis present

## 2013-06-26 DIAGNOSIS — R6510 Systemic inflammatory response syndrome (SIRS) of non-infectious origin without acute organ dysfunction: Secondary | ICD-10-CM

## 2013-06-26 DIAGNOSIS — I10 Essential (primary) hypertension: Secondary | ICD-10-CM | POA: Diagnosis present

## 2013-06-26 HISTORY — DX: Dependence on renal dialysis: Z99.2

## 2013-06-26 HISTORY — DX: Nonrheumatic aortic (valve) stenosis: I35.0

## 2013-06-26 HISTORY — DX: Gout, unspecified: M10.9

## 2013-06-26 HISTORY — DX: Chronic combined systolic (congestive) and diastolic (congestive) heart failure: I50.42

## 2013-06-26 HISTORY — DX: Dependence on renal dialysis: N18.6

## 2013-06-26 HISTORY — DX: Pulmonary hypertension, unspecified: I27.20

## 2013-06-26 LAB — COMPREHENSIVE METABOLIC PANEL
ALT: 20 U/L (ref 0–53)
Calcium: 8.6 mg/dL (ref 8.4–10.5)
GFR calc Af Amer: 9 mL/min — ABNORMAL LOW (ref >90)
Glucose, Bld: 106 mg/dL — ABNORMAL HIGH (ref 70–99)
Sodium: 134 mEq/L — ABNORMAL LOW (ref 135–145)
Total Protein: 6.9 g/dL (ref 6.0–8.3)

## 2013-06-26 LAB — CBC WITH DIFFERENTIAL/PLATELET
Basophils Absolute: 0 K/uL (ref 0.0–0.1)
Basophils Relative: 1 % (ref 0–1)
Eosinophils Absolute: 0 10*3/uL (ref 0.0–0.7)
Eosinophils Relative: 2 % (ref 0–5)
HCT: 27.5 % — ABNORMAL LOW (ref 39.0–52.0)
Hemoglobin: 9.2 g/dL — ABNORMAL LOW (ref 13.0–17.0)
Lymphocytes Relative: 37 % (ref 12–46)
Lymphs Abs: 0.6 K/uL — ABNORMAL LOW (ref 0.7–4.0)
MCH: 36.7 pg — ABNORMAL HIGH (ref 26.0–34.0)
MCHC: 33.5 g/dL (ref 30.0–36.0)
MCV: 109.6 fL — ABNORMAL HIGH (ref 78.0–100.0)
Monocytes Absolute: 0.2 10*3/uL (ref 0.1–1.0)
Monocytes Relative: 12 % (ref 3–12)
Neutro Abs: 0.9 K/uL — ABNORMAL LOW (ref 1.7–7.7)
Neutrophils Relative %: 48 % (ref 43–77)
Platelets: 87 10*3/uL — ABNORMAL LOW (ref 150–400)
RBC: 2.51 MIL/uL — ABNORMAL LOW (ref 4.22–5.81)
RDW: 23.4 % — ABNORMAL HIGH (ref 11.5–15.5)
WBC: 1.7 K/uL — ABNORMAL LOW (ref 4.0–10.5)

## 2013-06-26 LAB — COMPREHENSIVE METABOLIC PANEL WITH GFR
AST: 39 U/L — ABNORMAL HIGH (ref 0–37)
Albumin: 3.5 g/dL (ref 3.5–5.2)
Alkaline Phosphatase: 136 U/L — ABNORMAL HIGH (ref 39–117)
BUN: 51 mg/dL — ABNORMAL HIGH (ref 6–23)
CO2: 32 meq/L (ref 19–32)
Chloride: 90 meq/L — ABNORMAL LOW (ref 96–112)
Creatinine, Ser: 6.17 mg/dL — ABNORMAL HIGH (ref 0.50–1.35)
GFR calc non Af Amer: 7 mL/min — ABNORMAL LOW (ref >90)
Potassium: 5.2 meq/L — ABNORMAL HIGH (ref 3.5–5.1)
Total Bilirubin: 0.7 mg/dL (ref 0.3–1.2)

## 2013-06-26 LAB — CG4 I-STAT (LACTIC ACID): Lactic Acid, Venous: 2.31 mmol/L — ABNORMAL HIGH (ref 0.5–2.2)

## 2013-06-26 MED ORDER — VANCOMYCIN HCL IN DEXTROSE 1-5 GM/200ML-% IV SOLN
1000.0000 mg | Freq: Once | INTRAVENOUS | Status: DC
  Administered 2013-06-27: 1000 mg via INTRAVENOUS

## 2013-06-26 MED ORDER — SODIUM CHLORIDE 0.9 % IV BOLUS (SEPSIS)
500.0000 mL | Freq: Once | INTRAVENOUS | Status: AC
  Administered 2013-06-26: 500 mL via INTRAVENOUS

## 2013-06-26 MED ORDER — PIPERACILLIN-TAZOBACTAM 3.375 G IVPB 30 MIN
3.3750 g | Freq: Once | INTRAVENOUS | Status: AC
  Administered 2013-06-27: 3.375 g via INTRAVENOUS
  Filled 2013-06-26: qty 50

## 2013-06-26 MED ORDER — SODIUM CHLORIDE 0.9 % IV SOLN: 1000.0000 mL | INTRAVENOUS | Status: DC

## 2013-06-26 MED ORDER — VANCOMYCIN HCL IN DEXTROSE 1-5 GM/200ML-% IV SOLN
1000.0000 mg | INTRAVENOUS | Status: DC
  Administered 2013-06-27 – 2013-06-30 (×2): 1000 mg via INTRAVENOUS
  Filled 2013-06-26 (×5): qty 200

## 2013-06-26 MED ORDER — ACETAMINOPHEN 325 MG PO TABS
650.0000 mg | ORAL_TABLET | Freq: Once | ORAL | Status: AC
  Administered 2013-06-26: 650 mg via ORAL
  Filled 2013-06-26: qty 2

## 2013-06-26 MED ORDER — VANCOMYCIN HCL 10 G IV SOLR
1750.0000 mg | INTRAVENOUS | Status: AC
  Administered 2013-06-27: 1750 mg via INTRAVENOUS
  Filled 2013-06-26: qty 1750

## 2013-06-26 MED ORDER — SODIUM CHLORIDE 0.9 % IV SOLN: 1000.0000 mL | Freq: Once | INTRAVENOUS | Status: DC

## 2013-06-26 MED ORDER — PIPERACILLIN-TAZOBACTAM IN DEX 2-0.25 GM/50ML IV SOLN
2.2500 g | Freq: Three times a day (TID) | INTRAVENOUS | Status: DC
  Administered 2013-06-27: 2.25 g via INTRAVENOUS
  Filled 2013-06-26 (×3): qty 50

## 2013-06-26 MED ORDER — SODIUM CHLORIDE 0.9 % IV BOLUS (SEPSIS)
250.0000 mL | Freq: Once | INTRAVENOUS | Status: AC
  Administered 2013-06-27: 250 mL via INTRAVENOUS

## 2013-06-26 NOTE — ED Provider Notes (Addendum)
CSN: [id number]     Arrival date & time 06/26/13  2025 History     First MD Initiated Contact with Patient 06/26/13 2112     Chief Complaint  Patient presents with  . Fatigue   (Consider location/radiation/quality/duration/timing/severity/associated sxs/prior Treatment) HPI Comments: Pt is a dialysis patient, had full dialysis yesterday, has had poor appetite, worse in the past 2-3 days.  No N/V/D.  Was treated for pnuemonia a couple of weeks ago, stopped abx several days ago (about 6).  At dialysis yesterday didn't have a fever, likely because he has been taking tylenol rather regularly.  Last dose was at 3 PM.  Confusion has been mild, intermittent per daughter who has been staying with pt and she thinks associated with fever.  Pt makes nearly no urine.  Patient is a 79 y.o. male presenting with weakness and fever. The history is provided by the patient, a relative and medical records.  Weakness This is a new problem. The current episode started more than 1 week ago. The problem occurs constantly. The problem has been gradually worsening. Associated symptoms include shortness of breath. Pertinent negatives include no chest pain, no abdominal pain and no headaches. The symptoms are aggravated by exertion. Nothing relieves the symptoms. He has tried acetaminophen for the symptoms.  Fever Max temp prior to arrival:  102 Temp source:  Oral Severity:  Moderate Duration:  10 days Progression:  Waxing and waning Relieved by:  Acetaminophen Worsened by:  Exertion Associated symptoms: chills, confusion and myalgias   Associated symptoms: no chest pain, no cough, no diarrhea, no headaches, no nausea, no rash, no sore throat and no vomiting     Past Medical History  Diagnosis Date  . Secondary hyperparathyroidism   . Peripheral vascular disease   . Hyperlipidemia   . Duodenal ulcer     remote  . Gout STABLE  . Arteriosclerotic cardiovascular disease (ASCVD) 1998    CABG-1998; h/o CHF;  Atherosclerosis of great vessels, aorta and coronaries on CT in 02/2012  . Cerebrovascular disease   . Neuropathy of foot   . Degenerative joint disease   . Gastroesophageal reflux disease   . History of bladder cancer   . Impaired hearing     Bilateral; hearing aids relatively ineffective  . Anemia     Prior blood transfusion  . Hypertension   . Cholelithiasis 03/2011    Diagnosed incidentally on abdominal ultrasound in 03/2011  . Hepatic steatosis   . Macrocytosis 10/20/2012  . Thrombocytopenia 10/20/2012  . Gallstones   . ESRD (end stage renal disease)     Hemodialysis since 2003; Nephrologist-Dr. Caryn Section  . Renal cell carcinoma 2001    s/p right nephrectomy-2001; subsequent ESRD  . ESRD (end stage renal disease) on dialysis 08/20/2012   Past Surgical History  Procedure Laterality Date  . Right nephrectomy  2001    Renal cell carcinoma  . Colonoscopy  01/24/2011    prominent vascular pattern, suboptimal prep but doable  . Esophagogastroduodenoscopy  01/24/2011    mild erosive reflux esophagitis, bulbar/antral erosions, bx from antrum benign  . Av fistula repair  2008    revision of anastomosis of Right AVF  . Arteriovenous graft placement  2006    Thrombectomy and interposition jump graft revision to higher axillary vein of LUA AVG  . Coronary artery bypass graft  1998    X4 VESSELS  . Multiple cysto/ resection tumor's with bx's  LAST ONE 05-09-2011  . Multiple surg's / interventions for avgg/  fistula right upper arm  LAST REVISION 06-29-2011    (JUNE 2010 STENT CEPHALIC VEIN/ 03-23-2011 DILATATION ANGIOPLASTY)  . Left ptc left renal artery    . Cardiac catheterization  2004  . Cataract extraction w/ intraocular lens  implant, bilateral    . Cystoscopy w/ retrogrades  12/28/2011    Procedure: CYSTOSCOPY WITH RETROGRADE PYELOGRAM;  Surgeon: [name], MD;  Location: [facility];  Service: Urology;  Laterality: Left;  C-ARM   . Transurethral resection of bladder  tumor  12/28/2011    Procedure: TRANSURETHRAL RESECTION OF BLADDER TUMOR (TURBT);  Surgeon: [name], MD;  Location: [facility];  Service: Urology;  Laterality: N/A;  . Av fistula placement  02/29/2012    Procedure: INSERTION OF ARTERIOVENOUS (AV) GORE-TEX GRAFT THIGH;  Surgeon: [name], MD;  Location: [facility] OR;  Service: Vascular;  Laterality: Right;  Insertion right femoral arteriovenous gortex graft  . Ligation of right braciocephalic av fistula  04-04-2012  . Insertion of dialysis catheter  06/05/2012    Procedure: INSERTION OF DIALYSIS CATHETER;  Surgeon: Pryor Ochoa, MD;  Location: [facility] OR;  Service: Vascular;  Laterality: Left;  Insertion diatek catheter left IJ  . Insertion of dialysis catheter  08/22/2012    Procedure: INSERTION OF DIALYSIS CATHETER;  Surgeon: Nada Libman, MD;  Location: [facility];  Service: Vascular;  Laterality: Left;  insertion of dialysis catheter left  internal jugular 27cm  . Av fistula placement  10/08/2012    Procedure: INSERTION OF ARTERIOVENOUS (AV) GORE-TEX GRAFT THIGH;  Surgeon: Chuck Hint, MD;  Location: [facility] OR;  Service: Vascular;  Laterality: Left;   Family History  Problem Relation Age of Onset  . Colon cancer Neg Hx   . Liver disease Neg Hx   . Anesthesia problems Neg Hx   . Heart disease Father    History  Substance Use Topics  . Smoking status: Former Smoker -- 1.00 packs/day for 25 years    Types: Cigarettes    Quit date: 02/26/1984  . Smokeless tobacco: Former Neurosurgeon     Comment: quit 1985  . Alcohol Use: No    Review of Systems  Constitutional: Positive for fever, chills and fatigue.  HENT: Negative for sore throat.   Respiratory: Positive for shortness of breath. Negative for cough and chest tightness.   Cardiovascular: Negative for chest pain.  Gastrointestinal: Negative for nausea, vomiting, abdominal pain and diarrhea.  Musculoskeletal: Positive for myalgias.  Skin: Negative for rash.   Neurological: Positive for weakness. Negative for headaches.  Psychiatric/Behavioral: Positive for confusion.  All other systems reviewed and are negative.    Allergies  Review of patient's allergies indicates no known allergies.  Home Medications   Current Outpatient Rx  Name  Route  Sig  Dispense  Refill  . acetaminophen (TYLENOL) 500 MG tablet   Oral   Take 500 mg by mouth every 6 (six) hours as needed. For pain/headache         . albuterol (PROVENTIL HFA;VENTOLIN HFA) 108 (90 BASE) MCG/ACT inhaler   Inhalation   Inhale 2 puffs into the lungs every 6 (six) hours as needed for wheezing or shortness of breath.   1 Inhaler   0   . allopurinol (ZYLOPRIM) 100 MG tablet   Oral   Take 100 mg by mouth at bedtime.          Marland Kitchen aspirin EC 81 MG tablet   Oral   Take 81  mg by mouth at bedtime.          Marland Kitchen atorvastatin (LIPITOR) 10 MG tablet   Oral   Take 10 mg by mouth every morning.          . B Complex-C-Folic Acid (DIALYVITE TABLET) TABS   Oral   Take 1 tablet by mouth at bedtime.          . calcium carbonate (TUMS - DOSED IN MG ELEMENTAL CALCIUM) 500 MG chewable tablet   Oral   Chew 1 tablet by mouth daily as needed for heartburn. For heartburn         . cinacalcet (SENSIPAR) 30 MG tablet   Oral   Take 30 mg by mouth daily with breakfast.          . colchicine (COLCRYS) 0.6 MG tablet   Oral   Take 0.6 mg by mouth 2 (two) times daily as needed. for gout         . gabapentin (NEURONTIN) 100 MG capsule   Oral   Take 100 mg by mouth 2 (two) times daily.         . midodrine (PROAMATINE) 10 MG tablet   Oral   Take 10 mg by mouth See admin instructions. Take on Mon, Wed & Fri with dialysis. Then take 1 cap nightly prior to dialysis to increases blood pressure.         Marland Kitchen omeprazole (PRILOSEC) 40 MG capsule   Oral   Take 40 mg by mouth at bedtime. For reflux         . polyethylene glycol powder (MIRALAX) powder   Oral   Take 17 g by mouth at  bedtime.          . sevelamer (RENVELA) 800 MG tablet   Oral   Take 1,600-3,200 mg by mouth 5 (five) times daily. 4 tabs 3 x daily and 2 with snack          BP 134/66  Pulse 96  Temp(Src) 99.8 F (37.7 C) (Oral)  Resp 31  Ht 5\' 8"  (1.727 m)  Wt 174 lb 2.6 oz (79 kg)  BMI 26.49 kg/m2  SpO2 93% Physical Exam  Nursing note and vitals reviewed. Constitutional: He is oriented to person, place, and time. He appears well-developed and well-nourished. No distress.  HENT:  Head: Normocephalic and atraumatic.  Mouth/Throat: Mucous membranes are dry.  Eyes: EOM are normal.  Neck: Normal range of motion. Neck supple.  Cardiovascular: Normal rate, regular rhythm and intact distal pulses.   No murmur heard. Pulmonary/Chest: Effort normal. No stridor. He has no wheezes. He has no rales.  Abdominal: Soft. He exhibits no distension. There is no tenderness. There is no rebound.  Musculoskeletal: He exhibits no edema and no tenderness.  Palpable thrill, no erythema, tenderness to left thigh access  Neurological: He is alert and oriented to person, place, and time. He exhibits normal muscle tone. Coordination normal.  Skin: Skin is dry. No rash noted. He is not diaphoretic.    ED Course   Procedures (including critical care time)   CRITICAL CARE Performed by: Lear Ng. Total critical care time: 30 min Critical care time was exclusive of separately billable procedures and treating other patients. Critical care was necessary to treat or prevent imminent or life-threatening deterioration. Critical care was time spent personally by me on the following activities: development of treatment plan with patient and/or surrogate as well as nursing, discussions with consultants, evaluation of patient's response to treatment,  examination of patient, obtaining history from patient or surrogate, ordering and performing treatments and interventions, ordering and review of laboratory studies,  ordering and review of radiographic studies, pulse oximetry and re-evaluation of patient's condition.   Labs Reviewed  CBC WITH DIFFERENTIAL - Abnormal; Notable for the following:    WBC 1.7 (*)    RBC 2.51 (*)    Hemoglobin 9.2 (*)    HCT 27.5 (*)    MCV 109.6 (*)    MCH 36.7 (*)    RDW 23.4 (*)    Platelets 87 (*)    Neutro Abs 0.9 (*)    Lymphs Abs 0.6 (*)    All other components within normal limits  COMPREHENSIVE METABOLIC PANEL - Abnormal; Notable for the following:    Sodium 134 (*)    Potassium 5.2 (*)    Chloride 90 (*)    Glucose, Bld 106 (*)    BUN 51 (*)    Creatinine, Ser 6.17 (*)    AST 39 (*)    Alkaline Phosphatase 136 (*)    GFR calc non Af Amer 7 (*)    GFR calc Af Amer 9 (*)    All other components within normal limits  CG4 I-STAT (LACTIC ACID) - Abnormal; Notable for the following:    Lactic Acid, Venous 2.31 (*)    All other components within normal limits  CULTURE, BLOOD (ROUTINE X 2)  CULTURE, BLOOD (ROUTINE X 2)   Dg Chest 2 View  06/26/2013   *RADIOLOGY REPORT*  Clinical Data: Fever and fatigue.  History of end-stage renal disease and bladder carcinoma.  CHEST - 2 VIEW  Comparison: 06/19/2013  Findings: Chronic pulmonary venous hypertensive changes are present which appear slightly more prominent compared to the prior chest x- ray.  No overt airspace edema or pleural fluid is identified.  The heart size is stable size post prior CABG.  Mild degenerative changes are present in the thoracic spine.  IMPRESSION: Chronic pulmonary venous hypertensive changes which may be more slightly more prominent.  However, no overt airspace edema is identified.   Original Report Authenticated By: Irish Lack, M.D.   1. Fever   2. Weakness   3. End-stage renal disease (ESRD)   4. Neutropenia   5. Hypoxia     RA sat is 91% and I interpret to be abnormal, low.    ECG at time 21:56 shows sinus tachycardia, no peaked T waves, normal axis, occasional PAC, borderline  repol abn with non specific ST segments laterally.  Abn ECG.  No sig chagne from ECG on 06/23/13.   O2 applied with improvement in saturation.  Fever to 101, rectal, tylenol ordered.  Cultures obtained.    11:42 PM Pt still feels weak, remains tachycardic, BP is marginally lower, concerning for psosibly worsening sepsis.  Unknown source, will start empiric abx.  CXR is ok.  Makes no urine.  Doesn't appear meningismic.  I have ordered vanco and zosyn for early sepsis without obvious source.      11:46 PM WBC came back at 1.7, differential is still pending.  Concern now for possibly neutropenia.  Will consult nephrology to see pt and also hospitalist to see pt for admission.   Discussed with [facility] hospitalist who agrees with admission.  MDM  Pt with fever, possibly septic with intermittent confusion, although A&O times 3 now.  Sats low, no CP or cough, but pneumonia could be on differential.  No N/V/D.  Poor appetite, appears mildly dry, will give  small IVF bolus since he is on dialysis.  K+ is mildly up at 5.1.  Will check ECG.  Likely will need admission.    Gavin Pound. Oletta Lamas, MD 06/27/13 1610  Gavin Pound. Oletta Lamas, MD 06/27/13 [phone]

## 2013-06-26 NOTE — ED Notes (Signed)
Attempted to get urine.  Pt unable to void at this time.

## 2013-06-26 NOTE — ED Notes (Signed)
Phlebotomy at bedside.

## 2013-06-26 NOTE — ED Notes (Signed)
Patient requesting water, ok per Dr. Oletta Lamas. Provided water. Tolerated well.

## 2013-06-26 NOTE — ED Notes (Signed)
PT. REPORTED FEELING " TIRED" GENERALIZED WEAKNESS WITH BODY ACHES ONSET TODAY , HEMODIALYSIS MON/WED/FRI, RESPIRATIONS UNLABORED . ALERT AND ORIENTED.

## 2013-06-26 NOTE — Progress Notes (Addendum)
ANTIBIOTIC CONSULT NOTE - INITIAL  Pharmacy Consult for Vancocin. Maxipime, and Zosyn Indication: rule out sepsis  No Known Allergies  Patient Measurements: Height: 5\' 8"  (172.7 cm) Weight: 174 lb 2.6 oz (79 kg) IBW/kg (Calculated) : 68.4  Vital Signs: Temp: 99.8 F (37.7 C) (07/31 2332) Temp src: Oral (07/31 2332) BP: 94/81 mmHg (07/31 2332) Pulse Rate: 116 (07/31 2332)  Labs:  Recent Labs  06/26/13 2154  WBC 1.7*  HGB 9.2*  PLT 87*  CREATININE 6.17*   Estimated Creatinine Clearance: 8.5 ml/min (by C-G formula based on Cr of 6.17).   Microbiology: No results found for this or any previous visit (from the past 720 hour(s)).  Medical History: Past Medical History  Diagnosis Date  . Secondary hyperparathyroidism   . Peripheral vascular disease   . Hyperlipidemia   . Duodenal ulcer     remote  . Gout STABLE  . Arteriosclerotic cardiovascular disease (ASCVD) 1998    CABG-1998; h/o CHF; Atherosclerosis of great vessels, aorta and coronaries on CT in 02/2012  . Cerebrovascular disease   . Neuropathy of foot   . Degenerative joint disease   . Gastroesophageal reflux disease   . History of bladder cancer   . Impaired hearing     Bilateral; hearing aids relatively ineffective  . Anemia     Prior blood transfusion  . Hypertension   . Cholelithiasis 03/2011    Diagnosed incidentally on abdominal ultrasound in 03/2011  . Hepatic steatosis   . Macrocytosis 10/20/2012  . Thrombocytopenia 10/20/2012  . Gallstones   . ESRD (end stage renal disease)     Hemodialysis since 2003; Nephrologist-Dr. Caryn Section  . Renal cell carcinoma 2001    s/p right nephrectomy-2001; subsequent ESRD  . ESRD (end stage renal disease) on dialysis 08/20/2012     Assessment: 79yo male c/o fatigue and generalized weakness w/ body aches starting today, had been tx'd for PNA recently, stopped ABX a few days ago, no fever initially in ED though pt admits to taking APAP regularly, temp rose to 101,  remains tachycardiac, to begin IV ABX for possible early sepsis.  Goal of Therapy:  Pre-HD vanc level 15-25  Plan:  Zosyn 3.375g IV ordered in ED; will give vancomycin 1750mg  IV x1 now followed by 1g after each HD as well as Zosyn 2.25g IV Q8H and Maxipime 2g now and on HD days and monitor CBC, Cx, levels prn.  Vernard Gambles, PharmD, BCPS  06/26/2013,11:50 PM

## 2013-06-27 ENCOUNTER — Encounter (HOSPITAL_COMMUNITY): Payer: Self-pay | Admitting: *Deleted

## 2013-06-27 DIAGNOSIS — D61818 Other pancytopenia: Secondary | ICD-10-CM

## 2013-06-27 DIAGNOSIS — R509 Fever, unspecified: Secondary | ICD-10-CM

## 2013-06-27 DIAGNOSIS — A419 Sepsis, unspecified organism: Secondary | ICD-10-CM | POA: Insufficient documentation

## 2013-06-27 DIAGNOSIS — J189 Pneumonia, unspecified organism: Secondary | ICD-10-CM | POA: Diagnosis present

## 2013-06-27 DIAGNOSIS — N186 End stage renal disease: Secondary | ICD-10-CM

## 2013-06-27 DIAGNOSIS — G609 Hereditary and idiopathic neuropathy, unspecified: Secondary | ICD-10-CM

## 2013-06-27 LAB — DIC (DISSEMINATED INTRAVASCULAR COAGULATION)PANEL
D-Dimer, Quant: 4.7 ug/mL-FEU — ABNORMAL HIGH (ref 0.00–0.48)
Fibrinogen: 418 mg/dL (ref 204–475)
INR: 1.25 (ref 0.00–1.49)
Platelets: 76 10*3/uL — ABNORMAL LOW (ref 150–400)
Prothrombin Time: 15.4 seconds — ABNORMAL HIGH (ref 11.6–15.2)
aPTT: 32 seconds (ref 24–37)

## 2013-06-27 LAB — BLOOD GAS, ARTERIAL
Acid-Base Excess: 5.8 mmol/L — ABNORMAL HIGH (ref 0.0–2.0)
Bicarbonate: 29.6 mEq/L — ABNORMAL HIGH (ref 20.0–24.0)
Inspiratory PAP: 10
O2 Saturation: 99.7 %
TCO2: 30.9 mmol/L (ref 0–100)
pCO2 arterial: 41.8 mmHg (ref 35.0–45.0)
pO2, Arterial: 143 mmHg — ABNORMAL HIGH (ref 80.0–100.0)

## 2013-06-27 LAB — GLUCOSE, CAPILLARY: Glucose-Capillary: 115 mg/dL — ABNORMAL HIGH (ref 70–99)

## 2013-06-27 LAB — COMPREHENSIVE METABOLIC PANEL
AST: 31 U/L (ref 0–37)
Albumin: 2.8 g/dL — ABNORMAL LOW (ref 3.5–5.2)
BUN: 60 mg/dL — ABNORMAL HIGH (ref 6–23)
CO2: 30 mEq/L (ref 19–32)
Calcium: 7.9 mg/dL — ABNORMAL LOW (ref 8.4–10.5)
Chloride: 96 mEq/L (ref 96–112)
Creatinine, Ser: 7.23 mg/dL — ABNORMAL HIGH (ref 0.50–1.35)
GFR calc non Af Amer: 6 mL/min — ABNORMAL LOW (ref >90)
Total Bilirubin: 0.9 mg/dL (ref 0.3–1.2)

## 2013-06-27 LAB — IRON AND TIBC
Saturation Ratios: 85 % — ABNORMAL HIGH (ref 20–55)
TIBC: 205 ug/dL — ABNORMAL LOW (ref 215–435)

## 2013-06-27 LAB — CBC WITH DIFFERENTIAL/PLATELET
Basophils Absolute: 0 10*3/uL (ref 0.0–0.1)
Basophils Relative: 1 % (ref 0–1)
Eosinophils Absolute: 0 10*3/uL (ref 0.0–0.7)
HCT: 21.5 % — ABNORMAL LOW (ref 39.0–52.0)
Hemoglobin: 7 g/dL — ABNORMAL LOW (ref 13.0–17.0)
Lymphocytes Relative: 31 % (ref 12–46)
Lymphs Abs: 0.4 10*3/uL — ABNORMAL LOW (ref 0.7–4.0)
MCHC: 32.6 g/dL (ref 30.0–36.0)
MCV: 109.1 fL — ABNORMAL HIGH (ref 78.0–100.0)
Neutro Abs: 0.7 10*3/uL — ABNORMAL LOW (ref 1.7–7.7)
RDW: 23.6 % — ABNORMAL HIGH (ref 11.5–15.5)

## 2013-06-27 LAB — MRSA PCR SCREENING: MRSA by PCR: NEGATIVE

## 2013-06-27 MED ORDER — ALBUTEROL SULFATE HFA 108 (90 BASE) MCG/ACT IN AERS
2.0000 | INHALATION_SPRAY | Freq: Four times a day (QID) | RESPIRATORY_TRACT | Status: DC | PRN
  Administered 2013-06-30: 2 via RESPIRATORY_TRACT
  Filled 2013-06-27: qty 6.7

## 2013-06-27 MED ORDER — DOXERCALCIFEROL 4 MCG/2ML IV SOLN
1.0000 ug | INTRAVENOUS | Status: DC
  Administered 2013-06-30: 14:00:00 via INTRAVENOUS
  Administered 2013-07-02: 1 ug via INTRAVENOUS
  Filled 2013-06-27 (×3): qty 2

## 2013-06-27 MED ORDER — CINACALCET HCL 30 MG PO TABS
30.0000 mg | ORAL_TABLET | Freq: Every day | ORAL | Status: DC
  Administered 2013-06-27 – 2013-07-01 (×5): 30 mg via ORAL
  Filled 2013-06-27 (×7): qty 1

## 2013-06-27 MED ORDER — ONDANSETRON HCL 4 MG PO TABS: 4.0000 mg | ORAL_TABLET | Freq: Four times a day (QID) | ORAL | Status: DC | PRN

## 2013-06-27 MED ORDER — ASPIRIN EC 81 MG PO TBEC
81.0000 mg | DELAYED_RELEASE_TABLET | Freq: Every day | ORAL | Status: DC
  Administered 2013-06-27 – 2013-07-01 (×6): 81 mg via ORAL
  Filled 2013-06-27 (×7): qty 1

## 2013-06-27 MED ORDER — SEVELAMER CARBONATE 800 MG PO TABS
3200.0000 mg | ORAL_TABLET | Freq: Three times a day (TID) | ORAL | Status: DC
  Administered 2013-06-27 – 2013-07-02 (×15): 3200 mg via ORAL
  Filled 2013-06-27 (×19): qty 4

## 2013-06-27 MED ORDER — ACETAMINOPHEN 650 MG RE SUPP: 650.0000 mg | Freq: Four times a day (QID) | RECTAL | Status: DC | PRN

## 2013-06-27 MED ORDER — SODIUM CHLORIDE 0.9 % IJ SOLN
3.0000 mL | Freq: Two times a day (BID) | INTRAMUSCULAR | Status: DC
  Administered 2013-06-27 – 2013-07-01 (×9): 3 mL via INTRAVENOUS

## 2013-06-27 MED ORDER — HEPARIN SODIUM (PORCINE) 5000 UNIT/ML IJ SOLN
5000.0000 [IU] | Freq: Three times a day (TID) | INTRAMUSCULAR | Status: DC
  Filled 2013-06-27: qty 1

## 2013-06-27 MED ORDER — ONDANSETRON HCL 4 MG/2ML IJ SOLN: 4.0000 mg | Freq: Four times a day (QID) | INTRAMUSCULAR | Status: DC | PRN

## 2013-06-27 MED ORDER — SEVELAMER CARBONATE 800 MG PO TABS: 1600.0000 mg | ORAL_TABLET | Freq: Every day | ORAL | Status: DC

## 2013-06-27 MED ORDER — ALLOPURINOL 100 MG PO TABS
100.0000 mg | ORAL_TABLET | Freq: Every day | ORAL | Status: DC
  Administered 2013-06-27 – 2013-07-01 (×6): 100 mg via ORAL
  Filled 2013-06-27 (×7): qty 1

## 2013-06-27 MED ORDER — DEXTROSE 5 % IV SOLN
2.0000 g | INTRAVENOUS | Status: DC
  Administered 2013-06-27 – 2013-06-30 (×3): 2 g via INTRAVENOUS
  Filled 2013-06-27 (×5): qty 2

## 2013-06-27 MED ORDER — ALBUTEROL SULFATE (5 MG/ML) 0.5% IN NEBU
2.5000 mg | INHALATION_SOLUTION | Freq: Four times a day (QID) | RESPIRATORY_TRACT | Status: DC
  Administered 2013-06-27 – 2013-06-28 (×7): 2.5 mg via RESPIRATORY_TRACT
  Filled 2013-06-27 (×7): qty 0.5

## 2013-06-27 MED ORDER — MIDODRINE HCL 5 MG PO TABS
10.0000 mg | ORAL_TABLET | Freq: Three times a day (TID) | ORAL | Status: DC
  Administered 2013-06-27 – 2013-07-02 (×10): 10 mg via ORAL
  Filled 2013-06-27 (×18): qty 2

## 2013-06-27 MED ORDER — ACETAMINOPHEN 325 MG PO TABS: 650.0000 mg | ORAL_TABLET | Freq: Four times a day (QID) | ORAL | Status: DC | PRN

## 2013-06-27 MED ORDER — DARBEPOETIN ALFA-POLYSORBATE 150 MCG/0.3ML IJ SOLN
INTRAMUSCULAR | Status: AC
  Filled 2013-06-27: qty 0.3

## 2013-06-27 MED ORDER — DOXERCALCIFEROL 4 MCG/2ML IV SOLN
INTRAVENOUS | Status: AC
  Filled 2013-06-27: qty 2

## 2013-06-27 MED ORDER — DEXTROSE 5 % IV SOLN
2.0000 g | Freq: Once | INTRAVENOUS | Status: AC
  Administered 2013-06-27: 2 g via INTRAVENOUS
  Filled 2013-06-27: qty 2

## 2013-06-27 MED ORDER — DOCUSATE SODIUM 100 MG PO CAPS
100.0000 mg | ORAL_CAPSULE | Freq: Two times a day (BID) | ORAL | Status: DC | PRN
  Filled 2013-06-27: qty 1

## 2013-06-27 MED ORDER — SEVELAMER CARBONATE 800 MG PO TABS
1600.0000 mg | ORAL_TABLET | Freq: Two times a day (BID) | ORAL | Status: DC
  Administered 2013-06-27 – 2013-06-28 (×3): 1600 mg via ORAL
  Filled 2013-06-27 (×4): qty 2

## 2013-06-27 MED ORDER — IPRATROPIUM BROMIDE 0.02 % IN SOLN
0.5000 mg | Freq: Four times a day (QID) | RESPIRATORY_TRACT | Status: DC
  Administered 2013-06-27 – 2013-06-28 (×7): 0.5 mg via RESPIRATORY_TRACT
  Filled 2013-06-27 (×7): qty 2.5

## 2013-06-27 MED ORDER — NEPRO/CARBSTEADY PO LIQD: 237.0000 mL | Freq: Every day | ORAL | Status: DC

## 2013-06-27 MED ORDER — DARBEPOETIN ALFA-POLYSORBATE 150 MCG/0.3ML IJ SOLN
150.0000 ug | INTRAMUSCULAR | Status: DC
  Filled 2013-06-27: qty 0.3

## 2013-06-27 MED ORDER — ATORVASTATIN CALCIUM 10 MG PO TABS
10.0000 mg | ORAL_TABLET | Freq: Every morning | ORAL | Status: DC
  Administered 2013-06-27 – 2013-07-01 (×5): 10 mg via ORAL
  Filled 2013-06-27 (×6): qty 1

## 2013-06-27 MED ORDER — ALBUMIN HUMAN 5 % IV SOLN
25.0000 g | Freq: Once | INTRAVENOUS | Status: AC
  Administered 2013-06-27: 25 g via INTRAVENOUS
  Filled 2013-06-27: qty 500

## 2013-06-27 MED ORDER — PANTOPRAZOLE SODIUM 40 MG PO TBEC
40.0000 mg | DELAYED_RELEASE_TABLET | Freq: Every day | ORAL | Status: DC
  Administered 2013-06-27 – 2013-07-02 (×6): 40 mg via ORAL
  Filled 2013-06-27 (×5): qty 1

## 2013-06-27 NOTE — Progress Notes (Signed)
RT NOTE: Pt placed on BIPAP for increased WOB, Initial RR 36-40 on 2l . BIPAP started 10/5 40% FiO2. VT 580-650, RR 26-30. Neb TX given, ABG done. Ph 7.65/ CO2 41/ O2 143/ HCO3 29. Pt appears comfortable at this time. Rt will continue to monitor.

## 2013-06-27 NOTE — Progress Notes (Addendum)
[facility] Progress Note [place] TEAM 1 - Stepdown/ICU TEAM   MELIK BLANCETT AVW:[id number] DOB: 03-Sep-1926 DOA: 06/26/2013 PCP: [name]., MD  Brief narrative: This is an 79 year old male with a past medical history of coronary artery disease status post CABG, chronic heart failure, end-stage renal disease on hemodialysis, hypertension, chronic anemia and chronic thrombocytopenia. He was seen in the ER on 7/19 with a complaint of chest pain and shortness of breath. He was diagnosed with community-acquired pneumonia and placed on oral Levaquin which he apparently completed about 6 days prior to admit. On 7/31, patient was brought to the ER by his daughter with a complaint of shortness of lethargy, fevers and intermittent confusion  Assessment/Plan: Principal Problem: SIRS- Temp 102 - follow up on blood cultures - if it is pneumonia, is should be covered as HCAP rather than CAP (note outpt Levaquin was inadequate coverage) -He is currently receiving vancomycin, Zosyn and Cefepime- will d/c Zosyn- Vanc and Cefepime are adequate coverage.   Active Problems:  End-stage renal disease on hemodialysis/renal cell cancer -Nephrology has been consulted  Pulmonary edema - will be dialyzed today   Chronic pancytopenia - sees hematology as oupt- Dr Mariel Sleet  - Counts are lower than usual today -Follow carefully -Received blood transfusion on 7/28 when hemoglobin was 7.3 bringing it up to 9.2  Peripheral neuropathy On neurontin as outpt- will resume once lethargy resolves   Gout -on allopurinol   Hypotension -takes Midodrine on dialysis days  CAD/ CABG  Code Status: full Family Communication: none Disposition Plan: follow in sdu  Consultants: nephro  Procedures: none  Antibiotics: Vanc/ Zosyn and Cefepime 7/31>>  DVT prophylaxis: SCDs  HPI/Subjective: Pt unresponsive -    Objective: Blood pressure 97/48, pulse 76, temperature 98.5 F (36.9 C),  temperature source Oral, resp. rate 20, height 5\' 9"  (1.753 m), weight 78.019 kg (172 lb), SpO2 96.00%.  Intake/Output Summary (Last 24 hours) at 06/27/13 1318 Last data filed at 06/27/13 1000  Gross per 24 hour  Intake    340 ml  Output      0 ml  Net    340 ml     Exam: General: lethargic No acute respiratory distress Lungs: difficult to hear- decreased breath sounds Cardiovascular: Regular rate and rhythm without murmur gallop or rub normal S1 and S2 Abdomen: Nontender, nondistended, soft, bowel sounds positive, no rebound, no ascites, no appreciable mass Extremities: No significant cyanosis, clubbing, or edema bilateral lower extremities  Data Reviewed: Basic Metabolic Panel:  Recent Labs Lab 06/26/13 2154 06/27/13 0540  NA 134* 135  K 5.2* 5.1  CL 90* 96  CO2 32 30  GLUCOSE 106* 100*  BUN 51* 60*  CREATININE 6.17* 7.23*  CALCIUM 8.6 7.9*   Liver Function Tests:  Recent Labs Lab 06/26/13 2154 06/27/13 0540  AST 39* 31  ALT 20 18  ALKPHOS 136* 97  BILITOT 0.7 0.9  PROT 6.9 5.5*  ALBUMIN 3.5 2.8*   No results found for this basename: LIPASE, AMYLASE,  in the last 168 hours No results found for this basename: AMMONIA,  in the last 168 hours CBC:  Recent Labs Lab 06/23/13 1100 06/26/13 2154 06/27/13 0238 06/27/13 0540  WBC  --  1.7*  --  1.3*  NEUTROABS  --  0.9*  --  0.7*  HGB 7.3* 9.2*  --  7.0*  HCT 22.0* 27.5*  --  21.5*  MCV  --  109.6*  --  109.1*  PLT  --  87*  76* 68*   Cardiac Enzymes: No results found for this basename: CKTOTAL, CKMB, CKMBINDEX, TROPONINI,  in the last 168 hours BNP (last 3 results)  Recent Labs  06/14/13 2309  PROBNP 25066.0*   CBG:  Recent Labs Lab 06/27/13 0722  GLUCAP 115*    Recent Results (from the past 240 hour(s))  MRSA PCR SCREENING     Status: None   Collection Time    06/27/13  1:41 AM      Result Value Range Status   MRSA by PCR NEGATIVE  NEGATIVE Final   Comment:            The GeneXpert  MRSA Assay (FDA     approved for NASAL specimens     only), is one component of a     comprehensive MRSA colonization     surveillance program. It is not     intended to diagnose MRSA     infection nor to guide or     monitor treatment for     MRSA infections.     Studies:  Recent x-ray studies have been reviewed in detail by the Attending Physician  Scheduled Meds:  Scheduled Meds: . albuterol  2.5 mg Nebulization Q6H  . allopurinol  100 mg Oral QHS  . aspirin EC  81 mg Oral QHS  . atorvastatin  10 mg Oral q morning - 10a  . ceFEPime (MAXIPIME) IV  2 g Intravenous Q M,W,F-2000  . cinacalcet  30 mg Oral Q breakfast  . darbepoetin  150 mcg Subcutaneous Q Fri-HD  . doxercalciferol  1 mcg Intravenous Q M,W,F-HD  . ipratropium  0.5 mg Nebulization Q6H  . midodrine  10 mg Oral TID WC  . pantoprazole  40 mg Oral Daily  . piperacillin-tazobactam (ZOSYN)  IV  2.25 g Intravenous Q8H  . sevelamer carbonate  1,600 mg Oral BID BM  . sevelamer carbonate  3,200 mg Oral TID WC  . sodium chloride  3 mL Intravenous Q12H  . vancomycin  1,000 mg Intravenous Q M,W,F-HD   Continuous Infusions:   Time spent on care of this patient: 35 min   Kaydon Creedon, MD  [facility] Office  [phone] Pager - Text Page per Loretha Stapler as per below:  On-Call/Text Page:      [email or web address]      password [email or web address]  If 7PM-7AM, please contact night-coverage [email or web address] Password [email or web address] 06/27/2013, 1:18 PM   LOS: 1 day

## 2013-06-27 NOTE — Progress Notes (Addendum)
INITIAL NUTRITION ASSESSMENT  DOCUMENTATION CODES Per approved criteria  -Not Applicable   INTERVENTION:  Recommend Renal 80/90-12-29-1198 ml diet RD to follow for nutrition care plan, add supplements when able  NUTRITION DIAGNOSIS: Increased nutrient needs related to ESRD on HD as evidenced by estimated nutrition needs   Goal: Patient to meet >/= 90% of their estimated nutrition needs  Monitor:  PO & supplemental intake, weight, labs, I/O's  Reason for Assessment: Malnutrition Screening Tool Report  79 y.o. male  Admitting Dx: Sepsis  ASSESSMENT: Patient with PMH of CABG, CHF, ESRD on hemodialysis, hypertension, chronic anemia and thrombocytopenia; presented to ED with his daughter with the complaint of worsening shortness of breath and lethargy.    RD unable to obtain nutrition hx; patient confused with mittens on; PO intake variable at 25-50% per flowsheet records; per weight readings, weight has been stable; would benefit from addition of nutrition supplements when able.  Height: Ht Readings from Last 1 Encounters:  06/27/13 5\' 9"  (1.753 m)    Weight: Wt Readings from Last 1 Encounters:  06/27/13 172 lb (78.019 kg)    Ideal Body Weight: 160 lb  % Ideal Body Weight: 107%  Wt Readings from Last 10 Encounters:  06/27/13 172 lb (78.019 kg)  06/23/13 174 lb 4 oz (79.039 kg)  04/08/13 168 lb 14.4 oz (76.613 kg)  01/14/13 168 lb 4.8 oz (76.34 kg)  12/24/12 169 lb 8 oz (76.885 kg)  12/10/12 150 lb (68.04 kg)  10/21/12 171 lb 15.3 oz (78 kg)  10/09/12 167 lb 12.3 oz (76.1 kg)  10/09/12 167 lb 12.3 oz (76.1 kg)  09/24/12 172 lb 9.6 oz (78.291 kg)    Usual Body Weight: 168 lb  % Usual Body Weight: 102%  BMI:  Body mass index is 25.39 kg/(m^2).  Estimated Nutritional Needs: Kcal: 2000-2200 Protein: 100-110 gm Fluid: 1200 ml  Skin: Intact  Diet Order: NPO  EDUCATION NEEDS: -No education needs identified at this time   Intake/Output Summary (Last 24  hours) at 06/27/13 1258 Last data filed at 06/27/13 1000  Gross per 24 hour  Intake    340 ml  Output      0 ml  Net    340 ml    Labs:   Recent Labs Lab 06/26/13 2154 06/27/13 0540  NA 134* 135  K 5.2* 5.1  CL 90* 96  CO2 32 30  BUN 51* 60*  CREATININE 6.17* 7.23*  CALCIUM 8.6 7.9*  GLUCOSE 106* 100*    CBG (last 3)   Recent Labs  06/27/13 0722  GLUCAP 115*    Scheduled Meds: . albuterol  2.5 mg Nebulization Q6H  . allopurinol  100 mg Oral QHS  . aspirin EC  81 mg Oral QHS  . atorvastatin  10 mg Oral q morning - 10a  . ceFEPime (MAXIPIME) IV  2 g Intravenous Q M,W,F-2000  . cinacalcet  30 mg Oral Q breakfast  . darbepoetin  150 mcg Subcutaneous Q Fri-HD  . doxercalciferol  1 mcg Intravenous Q M,W,F-HD  . ipratropium  0.5 mg Nebulization Q6H  . midodrine  10 mg Oral TID WC  . pantoprazole  40 mg Oral Daily  . piperacillin-tazobactam (ZOSYN)  IV  2.25 g Intravenous Q8H  . sevelamer carbonate  1,600 mg Oral BID BM  . sevelamer carbonate  3,200 mg Oral TID WC  . sodium chloride  3 mL Intravenous Q12H  . vancomycin  1,000 mg Intravenous Q M,W,F-HD    Continuous Infusions:  Past Medical History  Diagnosis Date  . ESRD on hemodialysis     Hemodialysis since 2003; Occluded access in both upper extremities and the right lower extremity, current access as of Aug 2014 is L thigh AVG. Gets HD MWF at [facility].     . Duodenal ulcer     remote  . Gout STABLE  . Arteriosclerotic cardiovascular disease (ASCVD) 1998    CABG-1998; h/o CHF; Atherosclerosis of great vessels, aorta and coronaries on CT in 02/2012  . Cerebrovascular disease   . Neuropathy of foot   . Degenerative joint disease   . Gastroesophageal reflux disease   . History of bladder cancer   . Impaired hearing     Bilateral; hearing aids relatively ineffective  . Anemia     Prior blood transfusion  . Hypertension   . Cholelithiasis 03/2011    Diagnosed incidentally on abdominal  ultrasound in 03/2011  . Hepatic steatosis   . Macrocytosis 10/20/2012  . Thrombocytopenia 10/20/2012  . Gallstones   . Renal cell carcinoma 2001    s/p right nephrectomy-2001; subsequent ESRD  . Hyperlipidemia   . Secondary hyperparathyroidism   . Peripheral vascular disease     Past Surgical History  Procedure Laterality Date  . Right nephrectomy  2001    Renal cell carcinoma  . Colonoscopy  01/24/2011    prominent vascular pattern, suboptimal prep but doable  . Esophagogastroduodenoscopy  01/24/2011    mild erosive reflux esophagitis, bulbar/antral erosions, bx from antrum benign  . Av fistula repair  2008    revision of anastomosis of Right AVF  . Arteriovenous graft placement  2006    Thrombectomy and interposition jump graft revision to higher axillary vein of LUA AVG  . Coronary artery bypass graft  1998    X4 VESSELS  . Multiple cysto/ resection tumor's with bx's  LAST ONE 05-09-2011  . Multiple surg's / interventions for avgg/ fistula right upper arm  LAST REVISION 06-29-2011    (JUNE 2010 STENT CEPHALIC VEIN/ 03-23-2011 DILATATION ANGIOPLASTY)  . Left ptc left renal artery    . Cardiac catheterization  2004  . Cataract extraction w/ intraocular lens  implant, bilateral    . Cystoscopy w/ retrogrades  12/28/2011    Procedure: CYSTOSCOPY WITH RETROGRADE PYELOGRAM;  Surgeon: [name], MD;  Location: [facility];  Service: Urology;  Laterality: Left;  C-ARM   . Transurethral resection of bladder tumor  12/28/2011    Procedure: TRANSURETHRAL RESECTION OF BLADDER TUMOR (TURBT);  Surgeon: [name], MD;  Location: [facility];  Service: Urology;  Laterality: N/A;  . Av fistula placement  02/29/2012    Procedure: INSERTION OF ARTERIOVENOUS (AV) GORE-TEX GRAFT THIGH;  Surgeon: [name], MD;  Location: [facility] OR;  Service: Vascular;  Laterality: Right;  Insertion right femoral arteriovenous gortex graft  . Ligation of right braciocephalic av  fistula  04-04-2012  . Insertion of dialysis catheter  06/05/2012    Procedure: INSERTION OF DIALYSIS CATHETER;  Surgeon: Pryor Ochoa, MD;  Location: [facility] OR;  Service: Vascular;  Laterality: Left;  Insertion diatek catheter left IJ  . Insertion of dialysis catheter  08/22/2012    Procedure: INSERTION OF DIALYSIS CATHETER;  Surgeon: Nada Libman, MD;  Location: [facility];  Service: Vascular;  Laterality: Left;  insertion of dialysis catheter left  internal jugular 27cm  . Av fistula placement  10/08/2012    Procedure: INSERTION OF ARTERIOVENOUS (AV) GORE-TEX GRAFT THIGH;  Surgeon: Chuck Hint, MD;  Location: [facility] OR;  Service: Vascular;  Laterality: Left;    Maureen Chatters, RD, LDN Pager #: [phone] After-Hours Pager #: [phone]

## 2013-06-27 NOTE — Consult Note (Signed)
Andrew [name] 06/27/2013 [name] Requesting Physician: Dr Butler Denmark  Reason for Consult:  ESRD pt with AMS and fever HPI: The patient is a 79 y.o. year-old WF with hx of PVD, HTN, ESRD, renal cell CA, bladder Ca, HOH, gout and thrombocytopenia presented after being found by family on the floor, confused, at home.  He had had a temp fo 102 F.  He was seen in ED within last week and dx'd with PNA and given levaquin to take at home.  He finished the abx a few days ago.  In ED eval showed pancytopenia, low BP in 80's-90's and pt was confused. CXR showed inc'd vasc congestion c/t previous.  K slightly high, does not make much urine.    ROS  not available due to ams  Past Medical History  Past Medical History  Diagnosis Date  . Secondary hyperparathyroidism   . Peripheral vascular disease   . Hyperlipidemia   . Duodenal ulcer     remote  . Gout STABLE  . Arteriosclerotic cardiovascular disease (ASCVD) 1998    CABG-1998; h/o CHF; Atherosclerosis of great vessels, aorta and coronaries on CT in 02/2012  . Cerebrovascular disease   . Neuropathy of foot   . Degenerative joint disease   . Gastroesophageal reflux disease   . History of bladder cancer   . Impaired hearing     Bilateral; hearing aids relatively ineffective  . Anemia     Prior blood transfusion  . Hypertension   . Cholelithiasis 03/2011    Diagnosed incidentally on abdominal ultrasound in 03/2011  . Hepatic steatosis   . Macrocytosis 10/20/2012  . Thrombocytopenia 10/20/2012  . Gallstones   . ESRD (end stage renal disease)     Hemodialysis since 2003; Nephrologist-Dr. Caryn Section  . Renal cell carcinoma 2001    s/p right nephrectomy-2001; subsequent ESRD  . ESRD (end stage renal disease) on dialysis 08/20/2012   Past Surgical History  Past Surgical History  Procedure Laterality Date  . Right nephrectomy  2001    Renal cell carcinoma  . Colonoscopy  01/24/2011    prominent vascular pattern, suboptimal prep but doable  .  Esophagogastroduodenoscopy  01/24/2011    mild erosive reflux esophagitis, bulbar/antral erosions, bx from antrum benign  . Av fistula repair  2008    revision of anastomosis of Right AVF  . Arteriovenous graft placement  2006    Thrombectomy and interposition jump graft revision to higher axillary vein of LUA AVG  . Coronary artery bypass graft  1998    X4 VESSELS  . Multiple cysto/ resection tumor's with bx's  LAST ONE 05-09-2011  . Multiple surg's / interventions for avgg/ fistula right upper arm  LAST REVISION 06-29-2011    (JUNE 2010 STENT CEPHALIC VEIN/ 03-23-2011 DILATATION ANGIOPLASTY)  . Left ptc left renal artery    . Cardiac catheterization  2004  . Cataract extraction w/ intraocular lens  implant, bilateral    . Cystoscopy w/ retrogrades  12/28/2011    Procedure: CYSTOSCOPY WITH RETROGRADE PYELOGRAM;  Surgeon: [name], MD;  Location: [facility];  Service: Urology;  Laterality: Left;  C-ARM   . Transurethral resection of bladder tumor  12/28/2011    Procedure: TRANSURETHRAL RESECTION OF BLADDER TUMOR (TURBT);  Surgeon: [name], MD;  Location: [facility];  Service: Urology;  Laterality: N/A;  . Av fistula placement  02/29/2012    Procedure: INSERTION OF ARTERIOVENOUS (AV) GORE-TEX GRAFT THIGH;  Surgeon: [name], MD;  Location: [facility] OR;  Service: Vascular;  Laterality: Right;  Insertion right femoral arteriovenous gortex graft  . Ligation of right braciocephalic av fistula  04-04-2012  . Insertion of dialysis catheter  06/05/2012    Procedure: INSERTION OF DIALYSIS CATHETER;  Surgeon: Pryor Ochoa, MD;  Location: [facility] OR;  Service: Vascular;  Laterality: Left;  Insertion diatek catheter left IJ  . Insertion of dialysis catheter  08/22/2012    Procedure: INSERTION OF DIALYSIS CATHETER;  Surgeon: Nada Libman, MD;  Location: [facility];  Service: Vascular;  Laterality: Left;  insertion of dialysis catheter left  internal jugular 27cm  . Av  fistula placement  10/08/2012    Procedure: INSERTION OF ARTERIOVENOUS (AV) GORE-TEX GRAFT THIGH;  Surgeon: Chuck Hint, MD;  Location: [facility] OR;  Service: Vascular;  Laterality: Left;   Family History  Family History  Problem Relation Age of Onset  . Colon cancer Neg Hx   . Liver disease Neg Hx   . Anesthesia problems Neg Hx   . Heart disease Father    Social History  reports that he quit smoking about 29 years ago. His smoking use included Cigarettes. He has a 25 pack-year smoking history. He has quit using smokeless tobacco. He reports that he does not drink alcohol or use illicit drugs. Allergies No Known Allergies Home medications Prior to Admission medications   Medication Sig Start Date End Date Taking? Authorizing Provider  acetaminophen (TYLENOL) 500 MG tablet Take 500 mg by mouth every 6 (six) hours as needed. For pain/headache   Yes Historical Provider, MD  albuterol (PROVENTIL HFA;VENTOLIN HFA) 108 (90 BASE) MCG/ACT inhaler Inhale 2 puffs into the lungs every 6 (six) hours as needed for wheezing or shortness of breath. 06/23/13  Yes Jodelle Gross, NP  allopurinol (ZYLOPRIM) 100 MG tablet Take 100 mg by mouth at bedtime.  09/19/11  Yes Rhetta Mura, MD  aspirin EC 81 MG tablet Take 81 mg by mouth at bedtime.    Yes Historical Provider, MD  atorvastatin (LIPITOR) 10 MG tablet Take 10 mg by mouth every morning.    Yes Historical Provider, MD  B Complex-C-Folic Acid (DIALYVITE TABLET) TABS Take 1 tablet by mouth at bedtime.  05/24/12  Yes Historical Provider, MD  calcium carbonate (TUMS - DOSED IN MG ELEMENTAL CALCIUM) 500 MG chewable tablet Chew 1 tablet by mouth daily as needed for heartburn. For heartburn   Yes Historical Provider, MD  cinacalcet (SENSIPAR) 30 MG tablet Take 30 mg by mouth daily with breakfast.    Yes Historical Provider, MD  colchicine (COLCRYS) 0.6 MG tablet Take 0.6 mg by mouth 2 (two) times daily as needed. for gout   Yes Historical Provider,  MD  gabapentin (NEURONTIN) 100 MG capsule Take 100 mg by mouth 2 (two) times daily.   Yes Historical Provider, MD  midodrine (PROAMATINE) 10 MG tablet Take 10 mg by mouth See admin instructions. Take on Mon, Wed & Fri with dialysis. Then take 1 cap nightly prior to dialysis to increases blood pressure. 08/26/12  Yes Ripudeep Jenna Luo, MD  omeprazole (PRILOSEC) 40 MG capsule Take 40 mg by mouth at bedtime. For reflux   Yes Historical Provider, MD  polyethylene glycol powder (MIRALAX) powder Take 17 g by mouth at bedtime.    Yes Historical Provider, MD  sevelamer (RENVELA) 800 MG tablet Take 1,600-3,200 mg by mouth 5 (five) times daily. 4 tabs 3 x daily and 2 with snack   Yes Historical Provider, MD  Liver Function Tests  Recent Labs Lab 06/26/13 2154 06/27/13 0540  AST 39* 31  ALT 20 18  ALKPHOS 136* 97  BILITOT 0.7 0.9  PROT 6.9 5.5*  ALBUMIN 3.5 2.8*   No results found for this basename: LIPASE, AMYLASE,  in the last 168 hours CBC  Recent Labs Lab 06/23/13 1100 06/26/13 2154 06/27/13 0238 06/27/13 0540  WBC  --  1.7*  --  1.3*  NEUTROABS  --  0.9*  --  0.7*  HGB 7.3* 9.2*  --  7.0*  HCT 22.0* 27.5*  --  21.5*  MCV  --  109.6*  --  109.1*  PLT  --  87* 76* 68*   Basic Metabolic Panel  Recent Labs Lab 06/26/13 2154 06/27/13 0540  NA 134* 135  K 5.2* 5.1  CL 90* 96  CO2 32 30  GLUCOSE 106* 100*  BUN 51* 60*  CREATININE 6.17* 7.23*  CALCIUM 8.6 7.9*   Physical Exam:  Blood pressure 93/52, pulse 80, temperature 98.7 F (37.1 C), temperature source Oral, resp. rate 16, height 5\' 9"  (1.753 m), weight 172.4 kg (380 lb 1.2 oz), SpO2 100.00%. Gen: poorly responsive, opens eyes briefly to loud voice, breathing comfortably on nasal cannula, snoring off and on Skin: no rash, cyanosis HEENT:  EOMI, sclera anicteric, throat moist Neck: JVP 10cm, loud bilat carotid bruits Chest: decreased L base 1/3 up, R clear CV: irregular, 2/6 SEM at base Abdomen: soft, nontender, +BS,  liver down 3 cm, no bruits Ext: no LE or UE edema, no joint effusion, no gangrene/ulcers, atrophic skin chgs in feet, absent pop/pedal pulses, bilat fem bruits Neuro: as above, moving all ext but stuporous to obtunded Access: L thigh AVG +bruit, old accesses R thigh and both arms  Outpatient HD ( MWF) 4hrs  F160   Dry wt 73.5kg   Bath 2K, 2.25Ca    Prof 2    Heparin 3000   L thigh AVG Hect 1ug    EPO 20000    Venofer  none Pth 262, phos 3.9, ca 8.8, tsat 87% (july)  CXR- vasc congestion inc'd from last film  Impression 1. Fever / AMS- w/u per primary, on empiric abx; recent "PNA" treated with levaquin as op 2. Pancytopenia 3. Hx of thrombocytopenia 4. ESRD, usual hd MWF 5. Hypotension/volume- up 4-5 kg, on chronic midodrine, ?early chf on cxr 6. Anemia- low Hb, on max epo at center 7. Sec HPT- labs at goal, cont vit D, renvela and sensipar 8. CABG '98 9. Hx RCC and bladder Ca 10. PVD 11. Hard of hearing 12. Hx gout  Plan- HD today, UF as tolerated, cont midodrine, vit d, epo, check Harvel Quale  MD Pager [phone]    Cell  [phone] 06/27/2013, 9:18 AM

## 2013-06-27 NOTE — Progress Notes (Signed)
**Note De-Identified  Obfuscation** RT note: patient removed from BIPAP and placed on 2L [place], SAT 100%, BBS clear/diminished in bases. Patient tolerated transition. RT to continue to monitor.

## 2013-06-27 NOTE — Progress Notes (Signed)
CRITICAL VALUE ALERT  Critical value received: WBC 1.3  Date of notification:  06/27/13  Time of notification:0715  Critical value read back:yes  Nurse who received alert:  Arman Bogus RN  MD notified (1st page): Text Paged Dr Butler Denmark  Time of first page:  0730

## 2013-06-27 NOTE — Progress Notes (Signed)
Utilization review completed. Shantaya Bluestone, RN, BSN. 

## 2013-06-27 NOTE — H&P (Addendum)
[facility] History and Physical  DEKKER VERGA  ZOX:[id number]  DOB: 17-Aug-1926  DOA: 06/26/2013  Referring physician: Dr. Oletta Lamas PCP: [name]., MD  Specialists: Nephrology  Chief Complaint: Increasing lethargy and shortness of breath  HPI: Andrew [name] is a 79 y.o. male with Past medical history of coronary artery disease status post CABG, CHF, ESRD on hemodialysis, hypertension, chronic anemia and thrombocytopenia. He presented to the ED with his daughter with the complaint of worsening shortness of breath and lethargy. The patient has been seen in the ED on 06/15/2013 with the complaint of chest pain and shortness of breath and cough he was diagnosed at that time with pneumonia and was sent home with Levaquin he finished 3 dose with hemodialysis treatment for pneumonia. Since lost to Tuesdays he is getting PRBC along with hemodialysis. Yesterday when he was sent home off hemodialysis the dialysis technician noted some confusion and helped the patient to reach home.  At home the daughter found that his initial temperature was 45 Fahrenheit she gave him some Tylenol and the patient slept. Around 3:00 when the daughter wake up she found her father lying on the floor and confused. The patient denied any trauma or fall and a daughter gave him another Tylenol. The patient awoke he still had fever and started having wheezing and therefore the daughter decided to bring him to the hospital. The patient denies any complaint of nausea, vomiting, chest pain, dizziness, diarrhea, constipation, active bleeding, leg swelling  Review of Systems: as mentioned in the history of present illness.  A Comprehensive review of the other systems is negative.  Past Medical History  Diagnosis Date  . Secondary hyperparathyroidism   . Peripheral vascular disease   . Hyperlipidemia   . Duodenal ulcer     remote  . Gout STABLE  . Arteriosclerotic cardiovascular disease (ASCVD) 1998     CABG-1998; h/o CHF; Atherosclerosis of great vessels, aorta and coronaries on CT in 02/2012  . Cerebrovascular disease   . Neuropathy of foot   . Degenerative joint disease   . Gastroesophageal reflux disease   . History of bladder cancer   . Impaired hearing     Bilateral; hearing aids relatively ineffective  . Anemia     Prior blood transfusion  . Hypertension   . Cholelithiasis 03/2011    Diagnosed incidentally on abdominal ultrasound in 03/2011  . Hepatic steatosis   . Macrocytosis 10/20/2012  . Thrombocytopenia 10/20/2012  . Gallstones   . ESRD (end stage renal disease)     Hemodialysis since 2003; Nephrologist-Dr. Caryn Section  . Renal cell carcinoma 2001    s/p right nephrectomy-2001; subsequent ESRD  . ESRD (end stage renal disease) on dialysis 08/20/2012   Past Surgical History  Procedure Laterality Date  . Right nephrectomy  2001    Renal cell carcinoma  . Colonoscopy  01/24/2011    prominent vascular pattern, suboptimal prep but doable  . Esophagogastroduodenoscopy  01/24/2011    mild erosive reflux esophagitis, bulbar/antral erosions, bx from antrum benign  . Av fistula repair  2008    revision of anastomosis of Right AVF  . Arteriovenous graft placement  2006    Thrombectomy and interposition jump graft revision to higher axillary vein of LUA AVG  . Coronary artery bypass graft  1998    X4 VESSELS  . Multiple cysto/ resection tumor's with bx's  LAST ONE 05-09-2011  . Multiple surg's / interventions for avgg/ fistula right upper arm  LAST REVISION 06-29-2011    (  JUNE 2010 STENT CEPHALIC VEIN/ 03-23-2011 DILATATION ANGIOPLASTY)  . Left ptc left renal artery    . Cardiac catheterization  2004  . Cataract extraction w/ intraocular lens  implant, bilateral    . Cystoscopy w/ retrogrades  12/28/2011    Procedure: CYSTOSCOPY WITH RETROGRADE PYELOGRAM;  Surgeon: [name], MD;  Location: [facility];  Service: Urology;  Laterality: Left;  C-ARM   . Transurethral  resection of bladder tumor  12/28/2011    Procedure: TRANSURETHRAL RESECTION OF BLADDER TUMOR (TURBT);  Surgeon: [name], MD;  Location: [facility];  Service: Urology;  Laterality: N/A;  . Av fistula placement  02/29/2012    Procedure: INSERTION OF ARTERIOVENOUS (AV) GORE-TEX GRAFT THIGH;  Surgeon: [name], MD;  Location: [facility] OR;  Service: Vascular;  Laterality: Right;  Insertion right femoral arteriovenous gortex graft  . Ligation of right braciocephalic av fistula  04-04-2012  . Insertion of dialysis catheter  06/05/2012    Procedure: INSERTION OF DIALYSIS CATHETER;  Surgeon: Pryor Ochoa, MD;  Location: [facility] OR;  Service: Vascular;  Laterality: Left;  Insertion diatek catheter left IJ  . Insertion of dialysis catheter  08/22/2012    Procedure: INSERTION OF DIALYSIS CATHETER;  Surgeon: Nada Libman, MD;  Location: [facility];  Service: Vascular;  Laterality: Left;  insertion of dialysis catheter left  internal jugular 27cm  . Av fistula placement  10/08/2012    Procedure: INSERTION OF ARTERIOVENOUS (AV) GORE-TEX GRAFT THIGH;  Surgeon: Chuck Hint, MD;  Location: [facility];  Service: Vascular;  Laterality: Left;   Social History:  reports that he quit smoking about 29 years ago. His smoking use included Cigarettes. He has a 25 pack-year smoking history. He has quit using smokeless tobacco. He reports that he does not drink alcohol or use illicit drugs. Patient is coming from home. Patient can participate in ADLs.  No Known Allergies  Family History  Problem Relation Age of Onset  . Colon cancer Neg Hx   . Liver disease Neg Hx   . Anesthesia problems Neg Hx   . Heart disease Father     Prior to Admission medications   Medication Sig Start Date End Date Taking? Authorizing Provider  acetaminophen (TYLENOL) 500 MG tablet Take 500 mg by mouth every 6 (six) hours as needed. For pain/headache   Yes Historical Provider, MD  albuterol (PROVENTIL HFA;VENTOLIN HFA)  108 (90 BASE) MCG/ACT inhaler Inhale 2 puffs into the lungs every 6 (six) hours as needed for wheezing or shortness of breath. 06/23/13  Yes Jodelle Gross, NP  allopurinol (ZYLOPRIM) 100 MG tablet Take 100 mg by mouth at bedtime.  09/19/11  Yes Rhetta Mura, MD  aspirin EC 81 MG tablet Take 81 mg by mouth at bedtime.    Yes Historical Provider, MD  atorvastatin (LIPITOR) 10 MG tablet Take 10 mg by mouth every morning.    Yes Historical Provider, MD  B Complex-C-Folic Acid (DIALYVITE TABLET) TABS Take 1 tablet by mouth at bedtime.  05/24/12  Yes Historical Provider, MD  calcium carbonate (TUMS - DOSED IN MG ELEMENTAL CALCIUM) 500 MG chewable tablet Chew 1 tablet by mouth daily as needed for heartburn. For heartburn   Yes Historical Provider, MD  cinacalcet (SENSIPAR) 30 MG tablet Take 30 mg by mouth daily with breakfast.    Yes Historical Provider, MD  colchicine (COLCRYS) 0.6 MG tablet Take 0.6 mg by mouth 2 (two) times daily as needed. for  gout   Yes Historical Provider, MD  gabapentin (NEURONTIN) 100 MG capsule Take 100 mg by mouth 2 (two) times daily.   Yes Historical Provider, MD  midodrine (PROAMATINE) 10 MG tablet Take 10 mg by mouth See admin instructions. Take on Mon, Wed & Fri with dialysis. Then take 1 cap nightly prior to dialysis to increases blood pressure. 08/26/12  Yes Ripudeep Jenna Luo, MD  omeprazole (PRILOSEC) 40 MG capsule Take 40 mg by mouth at bedtime. For reflux   Yes Historical Provider, MD  polyethylene glycol powder (MIRALAX) powder Take 17 g by mouth at bedtime.    Yes Historical Provider, MD  sevelamer (RENVELA) 800 MG tablet Take 1,600-3,200 mg by mouth 5 (five) times daily. 4 tabs 3 x daily and 2 with snack   Yes Historical Provider, MD    Physical Exam: Filed Vitals:   06/27/13 0100 06/27/13 0102 06/27/13 0142 06/27/13 0200  BP: 120/75  150/90 138/68  Pulse: 112 110 123 117  Temp:  101.7 F (38.7 C) 98.7 F (37.1 C)   TempSrc:  Oral Oral   Resp: 22 28  32   Height:   5\' 9"  (1.753 m)   Weight:   172.4 kg (380 lb 1.2 oz)   SpO2: 100% 100% 98% 98%    General: Alert, Awake and Oriented to Person. Appear in severe distress Eyes: PERRL ENT: Oral Mucosa clear dry. Neck: minimal JVD, no Carotid Bruits, no Stiffness Cardiovascular: S1 and S2 Present, aortic systolic Murmur, Peripheral Pulses Present Respiratory: Bilateral Air entry equal and Decreased, bilateral Crackles, bilateral expiratory wheezes Abdomen: Bowel Sound Present, Soft and Non tender, Skin: No Rash Extremities: Trace Pedal edema, no calf tenderness Neurologic: Grossly Unremarkable.  Labs on Admission:  Basic Metabolic Panel:  Recent Labs Lab 06/26/13 2154  NA 134*  K 5.2*  CL 90*  CO2 32  GLUCOSE 106*  BUN 51*  CREATININE 6.17*  CALCIUM 8.6   Liver Function Tests:  Recent Labs Lab 06/26/13 2154  AST 39*  ALT 20  ALKPHOS 136*  BILITOT 0.7  PROT 6.9  ALBUMIN 3.5   No results found for this basename: LIPASE, AMYLASE,  in the last 168 hours No results found for this basename: AMMONIA,  in the last 168 hours CBC:  Recent Labs Lab 06/23/13 1100 06/26/13 2154 06/27/13 0238  WBC  --  1.7*  --   NEUTROABS  --  0.9*  --   HGB 7.3* 9.2*  --   HCT 22.0* 27.5*  --   MCV  --  109.6*  --   PLT  --  87* 76*   Cardiac Enzymes: No results found for this basename: CKTOTAL, CKMB, CKMBINDEX, TROPONINI,  in the last 168 hours  BNP (last 3 results)  Recent Labs  06/14/13 2309  PROBNP 25066.0*   CBG: No results found for this basename: GLUCAP,  in the last 168 hours  Radiological Exams on Admission: Dg Chest 2 View  06/26/2013   *RADIOLOGY REPORT*  Clinical Data: Fever and fatigue.  History of end-stage renal disease and bladder carcinoma.  CHEST - 2 VIEW  Comparison: 06/19/2013  Findings: Chronic pulmonary venous hypertensive changes are present which appear slightly more prominent compared to the prior chest x- ray.  No overt airspace edema or pleural fluid  is identified.  The heart size is stable size post prior CABG.  Mild degenerative changes are present in the thoracic spine.  IMPRESSION: Chronic pulmonary venous hypertensive changes which may be more slightly  more prominent.  However, no overt airspace edema is identified.   Original Report Authenticated By: Irish Lack, M.D.    EKG: Independently reviewed. Sinus tachycardia  Assessment/Plan Principal Problem:   Sepsis Active Problems:   SECONDARY HYPERPARATHYROIDISM   ANEMIA   Hypertension   ESRD (end stage renal disease)   CAD (coronary artery disease)   Thrombocytopenia   HCAP (healthcare-associated pneumonia)   1. acute hypoxic respiratory failure, secondary to sepsis and has had associated pneumonia. The patient does have significant diffuse changes in her chest x-ray is compared to prior chest x-ray one week ago. He had increased work of breathing with tachypnea and tachycardia. Extensive bilateral rhonchi and wheezing were heard. Possible etiology also include volume overload since the patient has received multiple blood transfusion over last few days. Patient has also developed severe pancytopenia most likely due to infection. At present the patient will be admitted in the step down unit. He will be given broad-spectrum antibiotic including IV vancomycin IV cefepime and IV Zosyn. Considering the possibility of volume overload albumin would be used for volume resuscitation. Dual nebs will be provided.  Patient does not have any documented evidence of COPD, but if the current treatment does not improve his symptom that he may benefit from steroids. BiPAP as needed.  2. pancytopenia likely secondary to severe infection We will monitor his H&H and transfuse as needed for hemoglobin less than 7. Considering his thrombocytopenia sequential compression devices will be used for DVT prophylaxis. A DIC panel has been requested. Lab has reported that there are no schistocytes on  the peripheral smear.  3. ESRD Nephrology will be consulted for continuation of hemodialysis. Dialysis might also help removing volume overload. We will continue his home Sensipar and renvela.  DVT Prophylaxis: subcutaneous Heparin Nutrition: Renal  Code Status: Full  Family Communication: Daughter was at the bedside, she was explained about plan and severity of the condition .  Author: Lynden Oxford, MD [facility] Pager: [phone] 06/27/2013 3:12 AM    If 7PM-7AM, please contact night-coverage [email or web address] Password [email or web address]

## 2013-06-28 LAB — GLUCOSE, CAPILLARY: Glucose-Capillary: 100 mg/dL — ABNORMAL HIGH (ref 70–99)

## 2013-06-28 LAB — CBC
HCT: 22.7 % — ABNORMAL LOW (ref 39.0–52.0)
Hemoglobin: 7.6 g/dL — ABNORMAL LOW (ref 13.0–17.0)
MCH: 36.7 pg — ABNORMAL HIGH (ref 26.0–34.0)
MCHC: 33.5 g/dL (ref 30.0–36.0)
MCV: 109.7 fL — ABNORMAL HIGH (ref 78.0–100.0)

## 2013-06-28 MED ORDER — IPRATROPIUM BROMIDE 0.02 % IN SOLN
0.5000 mg | Freq: Two times a day (BID) | RESPIRATORY_TRACT | Status: DC
  Administered 2013-06-29 – 2013-07-01 (×6): 0.5 mg via RESPIRATORY_TRACT
  Filled 2013-06-28 (×6): qty 2.5

## 2013-06-28 MED ORDER — HEPARIN SODIUM (PORCINE) 1000 UNIT/ML DIALYSIS
3000.0000 [IU] | Freq: Once | INTRAMUSCULAR | Status: AC
  Administered 2013-06-30: 1500 [IU] via INTRAVENOUS_CENTRAL
  Filled 2013-06-28: qty 3

## 2013-06-28 MED ORDER — POLYETHYLENE GLYCOL 3350 17 G PO PACK
17.0000 g | PACK | Freq: Every day | ORAL | Status: DC
  Administered 2013-06-28 – 2013-07-02 (×4): 17 g via ORAL
  Filled 2013-06-28 (×5): qty 1

## 2013-06-28 MED ORDER — LIDOCAINE HCL (PF) 1 % IJ SOLN: 5.0000 mL | INTRAMUSCULAR | Status: DC | PRN

## 2013-06-28 MED ORDER — PENTAFLUOROPROP-TETRAFLUOROETH EX AERO: 1.0000 "application " | INHALATION_SPRAY | CUTANEOUS | Status: DC | PRN

## 2013-06-28 MED ORDER — HEPARIN SODIUM (PORCINE) 1000 UNIT/ML DIALYSIS
1000.0000 [IU] | INTRAMUSCULAR | Status: DC | PRN
  Filled 2013-06-28: qty 1

## 2013-06-28 MED ORDER — GABAPENTIN 100 MG PO CAPS
100.0000 mg | ORAL_CAPSULE | Freq: Two times a day (BID) | ORAL | Status: DC
  Administered 2013-06-28 – 2013-07-01 (×7): 100 mg via ORAL
  Filled 2013-06-28 (×9): qty 1

## 2013-06-28 MED ORDER — NEPRO/CARBSTEADY PO LIQD: 237.0000 mL | ORAL | Status: DC | PRN

## 2013-06-28 MED ORDER — ALTEPLASE 2 MG IJ SOLR
2.0000 mg | Freq: Once | INTRAMUSCULAR | Status: AC | PRN
  Filled 2013-06-28: qty 2

## 2013-06-28 MED ORDER — LIDOCAINE-PRILOCAINE 2.5-2.5 % EX CREA: 1.0000 "application " | TOPICAL_CREAM | CUTANEOUS | Status: DC | PRN

## 2013-06-28 MED ORDER — SODIUM CHLORIDE 0.9 % IV SOLN: 100.0000 mL | INTRAVENOUS | Status: DC | PRN

## 2013-06-28 MED ORDER — ALBUTEROL SULFATE (5 MG/ML) 0.5% IN NEBU
2.5000 mg | INHALATION_SOLUTION | Freq: Two times a day (BID) | RESPIRATORY_TRACT | Status: DC
  Administered 2013-06-29 – 2013-07-01 (×6): 2.5 mg via RESPIRATORY_TRACT
  Filled 2013-06-28 (×6): qty 0.5

## 2013-06-28 NOTE — Progress Notes (Signed)
Daphane Shepherd, NP notified concerning patient's daughter request to have Miralax home medication continued.

## 2013-06-28 NOTE — Progress Notes (Addendum)
Pt confused, pulling telemetry off, stating he does not need it and he will go against doctors orders. Forgetful, frequent redirection and reorientation required. Attempting to get OOB unassisted, bed alarm ON. Spoke with daughter who will arrive at 1500. Dr. Butler Denmark notified and orders given for a sitter at the bedside. Paula Compton, AC, notified.

## 2013-06-28 NOTE — Progress Notes (Addendum)
[facility] Progress Note [facility] TEAM 1 - Stepdown/ICU TEAM   Andrew [name] NWG:[id number] DOB: May 01, 1926 DOA: 06/26/2013 PCP: [name]., MD  Brief narrative: This is an 79 year old male with a past medical history of coronary artery disease status post CABG, chronic heart failure, end-stage renal disease on hemodialysis, hypertension, chronic anemia and chronic thrombocytopenia. He was seen in the ER on 7/19 with a complaint of chest pain and shortness of breath. He was diagnosed with community-acquired pneumonia and placed on oral Levaquin which he apparently completed about 6 days prior to admit. On 7/31, patient was brought to the ER by his daughter with a complaint of shortness of lethargy, fevers and intermittent confusion  Assessment/Plan: Principal Problem: SIRS- Temp 102 - follow up on blood cultures - pt does not make urine- no UA - if it is pneumonia, is should be covered as HCAP rather than CAP (note outpt Levaquin was inadequate coverage) -He is currently receiving vancomycin, Zosyn and Cefepime-  d/c'd Zosyn on 8/1- Vanc and Cefepime are adequate coverage.  - repeat CXR after dialysis tomorrow- will need to be ordered  Active Problems:  End-stage renal disease on hemodialysis/renal cell cancer -Nephrology has been consulted  Pulmonary edema - resolved after dialysis- no longer requiring O2  Chronic pancytopenia - sees hematology as oupt- Dr Mariel Sleet  - Counts are lower than usual -Follow carefully -Received blood transfusion on 7/28 when hemoglobin was 7.3 bringing it up to 9.2  Peripheral neuropathy On neurontin as outpt- will resume   Gout -on allopurinol   Hypotension -takes Midodrine on dialysis days  CAD/ CABG  Code Status: full Family Communication: none Disposition Plan: follow in sdu  Consultants: nephro  Procedures: none  Antibiotics: Vanc/ Zosyn and Cefepime 7/31>>  DVT prophylaxis: SCDs  HPI/Subjective: Pt  alert and oriented today- quite restless - RN asking for sitter as pt pulling on lines   Objective: Blood pressure 96/54, pulse 105, temperature 97.6 F (36.4 C), temperature source Axillary, resp. rate 25, height 5\' 9"  (1.753 m), weight 77.7 kg (171 lb 4.8 oz), SpO2 94.00%.  Intake/Output Summary (Last 24 hours) at 06/28/13 1444 Last data filed at 06/28/13 1300  Gross per 24 hour  Intake    650 ml  Output   3000 ml  Net  -2350 ml     Exam: General: alert oreinted x 3 No acute respiratory distress Lungs: CTA b/l  Cardiovascular: Regular rate and rhythm without murmur gallop or rub normal S1 and S2 Abdomen: Nontender, nondistended, soft, bowel sounds positive, no rebound, no ascites, no appreciable mass Extremities: No significant cyanosis, clubbing, or edema bilateral lower extremities  Data Reviewed: Basic Metabolic Panel:  Recent Labs Lab 06/26/13 2154 06/27/13 0540  NA 134* 135  K 5.2* 5.1  CL 90* 96  CO2 32 30  GLUCOSE 106* 100*  BUN 51* 60*  CREATININE 6.17* 7.23*  CALCIUM 8.6 7.9*   Liver Function Tests:  Recent Labs Lab 06/26/13 2154 06/27/13 0540  AST 39* 31  ALT 20 18  ALKPHOS 136* 97  BILITOT 0.7 0.9  PROT 6.9 5.5*  ALBUMIN 3.5 2.8*   No results found for this basename: LIPASE, AMYLASE,  in the last 168 hours No results found for this basename: AMMONIA,  in the last 168 hours CBC:  Recent Labs Lab 06/23/13 1100 06/26/13 2154 06/27/13 0238 06/27/13 0540 06/28/13 0851  WBC  --  1.7*  --  1.3* 1.5*  NEUTROABS  --  0.9*  --  0.7*  --  HGB 7.3* 9.2*  --  7.0* 7.6*  HCT 22.0* 27.5*  --  21.5* 22.7*  MCV  --  109.6*  --  109.1* 109.7*  PLT  --  87* 76* 68* 67*   Cardiac Enzymes: No results found for this basename: CKTOTAL, CKMB, CKMBINDEX, TROPONINI,  in the last 168 hours BNP (last 3 results)  Recent Labs  06/14/13 2309  PROBNP 25066.0*   CBG:  Recent Labs Lab 06/27/13 0722 06/27/13 1130 06/27/13 2211 06/28/13 0820  06/28/13 1215  GLUCAP 115* 105* 109* 97 87    Recent Results (from the past 240 hour(s))  CULTURE, BLOOD (ROUTINE X 2)     Status: None   Collection Time    06/26/13  9:45 PM      Result Value Range Status   Specimen Description BLOOD RIGHT ARM   Final   Special Requests BOTTLES DRAWN AEROBIC AND ANAEROBIC 10CC   Final   Culture  Setup Time 06/27/2013 06:26   Final   Culture     Final   Value:        BLOOD CULTURE RECEIVED NO GROWTH TO DATE CULTURE WILL BE HELD FOR 5 DAYS BEFORE ISSUING A FINAL NEGATIVE REPORT   Report Status PENDING   Incomplete  CULTURE, BLOOD (ROUTINE X 2)     Status: None   Collection Time    06/26/13  9:56 PM      Result Value Range Status   Specimen Description BLOOD RIGHT HAND   Final   Special Requests BOTTLES DRAWN AEROBIC ONLY 5CC   Final   Culture  Setup Time 06/27/2013 06:26   Final   Culture     Final   Value:        BLOOD CULTURE RECEIVED NO GROWTH TO DATE CULTURE WILL BE HELD FOR 5 DAYS BEFORE ISSUING A FINAL NEGATIVE REPORT   Report Status PENDING   Incomplete  MRSA PCR SCREENING     Status: None   Collection Time    06/27/13  1:41 AM      Result Value Range Status   MRSA by PCR NEGATIVE  NEGATIVE Final   Comment:            The GeneXpert MRSA Assay (FDA     approved for NASAL specimens     only), is one component of a     comprehensive MRSA colonization     surveillance program. It is not     intended to diagnose MRSA     infection nor to guide or     monitor treatment for     MRSA infections.     Studies:  Recent x-ray studies have been reviewed in detail by the Attending Physician  Scheduled Meds:  Scheduled Meds: . albuterol  2.5 mg Nebulization Q6H  . allopurinol  100 mg Oral QHS  . aspirin EC  81 mg Oral QHS  . atorvastatin  10 mg Oral q morning - 10a  . ceFEPime (MAXIPIME) IV  2 g Intravenous Q M,W,F-2000  . cinacalcet  30 mg Oral Q breakfast  . darbepoetin  150 mcg Subcutaneous Q Fri-HD  . doxercalciferol  1 mcg  Intravenous Q M,W,F-HD  . ipratropium  0.5 mg Nebulization Q6H  . midodrine  10 mg Oral TID WC  . pantoprazole  40 mg Oral Daily  . sevelamer carbonate  1,600 mg Oral BID BM  . sevelamer carbonate  3,200 mg Oral TID WC  . sodium chloride  3 mL  Intravenous Q12H  . vancomycin  1,000 mg Intravenous Q M,W,F-HD   Continuous Infusions:   Time spent on care of this patient: 35 min   Calvert Cantor, MD  [facility] Office  [phone] Pager - Text Page per Loretha Stapler as per below:  On-Call/Text Page:      [email or web address]      password [email or web address]  If 7PM-7AM, please contact night-coverage [email or web address] Password [email or web address] 06/28/2013, 2:44 PM   LOS: 2 days

## 2013-06-28 NOTE — Progress Notes (Signed)
Throughout night, this nurse has noticed increase in pt's confusion. Pulling monitor, BP cuff, SPO2 off continously. Easily redirected but action to reoccurr 47mins-1h later. Mitts placed, bed alarm on. Pt continues to remove equipment even while nurse in room. Nurse in room ~2hrs. Awaiting to place NT in room for safety. Virgia Land, NP made aware of pt's condition and this nurse intervention. NP in agreement. Will continue to monitor.

## 2013-06-28 NOTE — Progress Notes (Signed)
Subjective:  Noted  Am confusion, now alert oriented , "feel rough" no sob, tolerated HD yesterday Objective Vital signs in last 24 hours: Filed Vitals:   06/28/13 0900 06/28/13 1000 06/28/13 1100 06/28/13 1200  BP:    96/54  Pulse: 107   105  Temp:    97.6 F (36.4 C)  TempSrc:    Axillary  Resp: 34 22 21 25   Height:      Weight:      SpO2: 100%   94%   Weight change: 0.2 kg (7.1 oz)  Intake/Output Summary (Last 24 hours) at 06/28/13 1305 Last data filed at 06/28/13 0900  Gross per 24 hour  Intake    410 ml  Output   3000 ml  Net  -2590 ml   Labs: Basic Metabolic Panel:  Recent Labs Lab 06/26/13 2154 06/27/13 0540  NA 134* 135  K 5.2* 5.1  CL 90* 96  CO2 32 30  GLUCOSE 106* 100*  BUN 51* 60*  CREATININE 6.17* 7.23*  CALCIUM 8.6 7.9*   Liver Function Tests:  Recent Labs Lab 06/26/13 2154 06/27/13 0540  AST 39* 31  ALT 20 18  ALKPHOS 136* 97  BILITOT 0.7 0.9  PROT 6.9 5.5*  ALBUMIN 3.5 2.8*   No results found for this basename: LIPASE, AMYLASE,  in the last 168 hours No results found for this basename: AMMONIA,  in the last 168 hours CBC:  Recent Labs Lab 06/26/13 2154 06/27/13 0238 06/27/13 0540 06/28/13 0851  WBC 1.7*  --  1.3* 1.5*  NEUTROABS 0.9*  --  0.7*  --   HGB 9.2*  --  7.0* 7.6*  HCT 27.5*  --  21.5* 22.7*  MCV 109.6*  --  109.1* 109.7*  PLT 87* 76* 68* 67*   Cardiac Enzymes: No results found for this basename: CKTOTAL, CKMB, CKMBINDEX, TROPONINI,  in the last 168 hours CBG:  Recent Labs Lab 06/27/13 0722 06/27/13 1130 06/27/13 2211 06/28/13 0820 06/28/13 1215  GLUCAP 115* 105* 109* 97 87    Iron Studies:  Recent Labs  06/27/13 1844  IRON 175*  TIBC 205*   Studies/Results: Dg Chest 2 View  06/26/2013   *RADIOLOGY REPORT*  Clinical Data: Fever and fatigue.  History of end-stage renal disease and bladder carcinoma.  CHEST - 2 VIEW  Comparison: 06/19/2013  Findings: Chronic pulmonary venous hypertensive changes are  present which appear slightly more prominent compared to the prior chest x- ray.  No overt airspace edema or pleural fluid is identified.  The heart size is stable size post prior CABG.  Mild degenerative changes are present in the thoracic spine.  IMPRESSION: Chronic pulmonary venous hypertensive changes which may be more slightly more prominent.  However, no overt airspace edema is identified.   Original Report Authenticated By: Irish Lack, M.D.   Medications:   . albuterol  2.5 mg Nebulization Q6H  . allopurinol  100 mg Oral QHS  . aspirin EC  81 mg Oral QHS  . atorvastatin  10 mg Oral q morning - 10a  . ceFEPime (MAXIPIME) IV  2 g Intravenous Q M,W,F-2000  . cinacalcet  30 mg Oral Q breakfast  . darbepoetin  150 mcg Subcutaneous Q Fri-HD  . doxercalciferol  1 mcg Intravenous Q M,W,F-HD  . ipratropium  0.5 mg Nebulization Q6H  . midodrine  10 mg Oral TID WC  . pantoprazole  40 mg Oral Daily  . sevelamer carbonate  1,600 mg Oral BID BM  . sevelamer  carbonate  3,200 mg Oral TID WC  . sodium chloride  3 mL Intravenous Q12H  . vancomycin  1,000 mg Intravenous Q M,W,F-HD     Physical Exam: General: Alert , Nad, chronically ill WM Heart: RRR, 1/6 sem lsb Lungs: Bilat wheezing, better than on admission Abdomen: bs pos. Soft ,nontender Extremities: Dialysis Access:  No  Pedal edema, pos. Bruit  L Fem . AVGG  Outpatient HD ([facility] MWF)  4hrs F160 Dry wt 73.5kg Bath 2K, 2.25Ca Prof 2 Heparin 3000 L thigh AVG  Hect 1ug EPO 20000 Venofer none  Pth 262, phos 3.9, ca 8.8, tsat 87% (july)     Impression  1. COPD flare / poss PNA- on vanc and maxipime, improved today 2. AMS- better 3. Pancytopenia 4. Hx of thrombocytopenia with CA  5. ESRD, usual hd MWFAzucena Kuba) tolerated yesterday 6. Hypotension/volume- chronic midodrine, ?early chf on cxr yesterday. 7. Anemia- low Hb7.0 > 7.6, on max epo at center/ fu am  hgb 8. Sec HPT- labs at goal, cont vit D, renvela and sensipar 9. Hx RCC  and bladder Ca 10. Hard of hearing   Andrew [name] Pastel, [facility] Beeper [phone] 06/28/2013,1:05 PM  LOS: 2 days   Patient seen and examined.  I agree with assessment and plan as above with additions as indicated.  Looks better today, more alert, oriented and reading thepaper.  Wheezing improved. On abx and copd meds.  Vinson Moselle  MD Pager [phone]    Cell  [phone] 06/28/2013, 1:20 PM

## 2013-06-29 ENCOUNTER — Inpatient Hospital Stay (HOSPITAL_COMMUNITY): Payer: Medicare Other

## 2013-06-29 LAB — GLUCOSE, CAPILLARY
Glucose-Capillary: 94 mg/dL (ref 70–99)
Glucose-Capillary: 87 mg/dL (ref 70–99)
Glucose-Capillary: 96 mg/dL (ref 70–99)
Glucose-Capillary: 103 mg/dL — ABNORMAL HIGH (ref 70–99)

## 2013-06-29 LAB — RENAL FUNCTION PANEL
Albumin: 2.7 g/dL — ABNORMAL LOW (ref 3.5–5.2)
BUN: 60 mg/dL — ABNORMAL HIGH (ref 6–23)
Chloride: 96 mEq/L (ref 96–112)
Creatinine, Ser: 6.66 mg/dL — ABNORMAL HIGH (ref 0.50–1.35)
Phosphorus: 3.1 mg/dL (ref 2.3–4.6)

## 2013-06-29 LAB — CBC
HCT: 20.1 % — ABNORMAL LOW (ref 39.0–52.0)
MCHC: 33.3 g/dL (ref 30.0–36.0)
MCV: 106.3 fL — ABNORMAL HIGH (ref 78.0–100.0)
RDW: 22.5 % — ABNORMAL HIGH (ref 11.5–15.5)

## 2013-06-29 LAB — PREPARE RBC (CROSSMATCH)

## 2013-06-29 LAB — TROPONIN I: Troponin I: <0.3 ng/mL (ref <0.30)

## 2013-06-29 LAB — OCCULT BLOOD X 1 CARD TO LAB, STOOL: Fecal Occult Bld: NEGATIVE

## 2013-06-29 MED ORDER — DARBEPOETIN ALFA-POLYSORBATE 200 MCG/0.4ML IJ SOLN
200.0000 ug | INTRAMUSCULAR | Status: DC
  Administered 2013-06-30: 200 ug via INTRAVENOUS
  Filled 2013-06-29 (×2): qty 0.4

## 2013-06-29 NOTE — Progress Notes (Signed)
CRITICAL VALUE ALERT  Critical value received:  Hgb 6.7  Date of notification:  06/29/13  Time of notification:  05:30  Critical value read back: yes  Nurse who received alert:  Sharen Heck  MD notified (1st page):  M. Burnadette Peter, NP  Time of first page:  05:33  MD notified (2nd page):  Time of second page:  Responding MD:   Time MD responded:

## 2013-06-29 NOTE — Progress Notes (Addendum)
Subjective:  Now on 6 east,still feeling rough,weak,ate most breakfast  Objective Vital signs in last 24 hours: Filed Vitals:   06/28/13 1800 06/28/13 2018 06/28/13 2052 06/29/13 0543  BP:  103/49  104/52  Pulse:  88  94  Temp:  97.8 F (36.6 C)  97.8 F (36.6 C)  TempSrc:  Oral  Oral  Resp: 21 20  20   Height:  5\' 9"  (1.753 m)    Weight:  76.6 kg (168 lb 14 oz)    SpO2:  98% 97% 95%   Weight change: -2.6 kg (-5 lb 11.7 oz)  Intake/Output Summary (Last 24 hours) at 06/29/13 0836 Last data filed at 06/29/13 0500  Gross per 24 hour  Intake    840 ml  Output      0 ml  Net    840 ml   Labs: Basic Metabolic Panel:  Recent Labs Lab 06/26/13 2154 06/27/13 0540 06/29/13 0415  NA 134* 135 132*  K 5.2* 5.1 4.2  CL 90* 96 96  CO2 32 30 26  GLUCOSE 106* 100* 117*  BUN 51* 60* 60*  CREATININE 6.17* 7.23* 6.66*  CALCIUM 8.6 7.9* 8.0*  PHOS  --   --  3.1   Liver Function Tests:  Recent Labs Lab 06/26/13 2154 06/27/13 0540 06/29/13 0415  AST 39* 31  --   ALT 20 18  --   ALKPHOS 136* 97  --   BILITOT 0.7 0.9  --   PROT 6.9 5.5*  --   ALBUMIN 3.5 2.8* 2.7*    CBG:  Recent Labs Lab 06/28/13 0820 06/28/13 1215 06/28/13 1726 06/28/13 2021 06/29/13 0739  GLUCAP 97 87 98 100* 94    Iron Studies:  Recent Labs  06/27/13 1844  IRON 175*  TIBC 205*  . Physical Exam:  General: Alert , Nad, chronically ill WM  Heart: RRR, 1/6 sem lsb  Lungs:faint bilat. Wheezing  Improved from yesterday ( just had breathing tx this AM)  Abdomen: bs pos. Soft ,nontender  Extremities: Dialysis Access: No Pedal edema, pos. Bruit L Fem . AVGG   Outpatient HD ([facility] MWF)  4hrs F160 Dry wt 73.5kg Bath 2K, 2.25Ca Prof 2 Heparin 3000 L thigh AVG  Hect 1ug EPO 20000 Venofer none  Pth 262, phos 3.9, ca 8.8, tsat 87% (july)   Impression  1. COPD flare / poss PNA- on vanc and maxipime,  cxr fu just done 2. AMS- Resolved 3. Pancytopenia with ho RCC/Bladder CA 4. ESRD, usual hd  MWF( [facility]) tolerated yesterday/ uf 3 to 3.5 l in am with hd 5. Hypotension/volume- chronic midodrine, ?early chf on cxr 8/01 repeat cxr this am.Still 3kg over dry wt, max UF HD in am 6. Anemia- low Hb7.0 > 7.6> 7.1 on max epo at center ( did not get this fri will give Aranesp with Monday HD)/ fu am hgb/ for transfusion this am/ reck transf. sat 7. Sec HPT- labs at goal, cont vit D, renvela and sensipar 8. Hx RCC and bladder Ca 9. Hard of hearing  Lenny Pastel, PA-C [facility] Beeper [phone] 06/29/2013,8:36 AM  LOS: 3 days   Patient seen and examined.  I agree with assessment and plan as above with additions as indicated. Vinson Moselle  MD Pager [phone]    Cell  [phone] 06/29/2013, 2:00 PM

## 2013-06-29 NOTE — Consult Note (Signed)
Referring MD:   PCP:  [name]., MD   Reason for Referral: Pancytopenia   Chief Complaint  Patient presents with  . Fatigue    HPI:  Hematology consultation for further evaluation of chronic pancytopenia in this 79 year old man with multiple medical problems. He has a solitary kidney status post nephrectomy for hypernephroma in 2001 with subsequent development of dialysis-dependent renal failure in his remaining kidney. He has coronary artery disease with previous bypass surgery. Peripheral vascular disease. He is status post TURB  January 2013 for a superficial noninvasive bladder cancer. He was initially evaluated for pancytopenia by one of my partners at time of a 08/19/2012 admission for sepsis. Hemoglobin 8.9, hematocrit 27.6, white count 2400, platelets 107,000. Further fall in  counts during that admission with white count down to 1900, platelets 88,000. Counts did improve somewhat by time of discharge and on 08/26/2012 white count 2800, hemoglobin 8.9, platelets 83,000. He was readmitted again in November 2013 with generalized weakness and was found to have hemoglobin of 7.3, hematocrit 22%, MCV 111, initial white count 4700 and platelets 145,000 with repeat values white count 3000 and platelets  99,000. B12 and folic acid levels were normal. Free T4 normal. Hepatitis B surface antigen negative. Outpatient hematology followup was arranged in our [place] office. He saw Dr. Glenford Peers on 12/24/2012. At that time hemoglobin 10.7, hematocrit 33.5, MCV 117, white count 4100 with 52% neutrophils, 34 lymphocytes, 7 monocytes, 6 eosinophils, platelet count 105,000. Serum iron 40, percent saturation 16, ferritin 708. A myelodysplastic syndrome was suspected but the patient repeatedly declined a bone marrow examination at time of subsequent visits in February and in May of this year.  He is now readmitted on 06/26/2013 and he was found on the floor by his family and was confused. He  again appeared to be septic with fever of 102. Blood counts on admission with hemoglobin 9.2, hematocrit 27.5, MCV 110, white count 1700, 48 neutrophils, 37 lymphocytes, 12 monocytes, platelets 87,000. Post hydration his hemoglobin fell to 6.7. Platelets have also fallen to 60,000.  He has a chronic distal neuropathy. He is getting Epogen injections at time of dialysis procedures. (Therefore, checking a erythropoietin level will not be helpful).  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 He was in [place] -and although he transported agent orange, he never had direct exposure to it. [place] farm and had some exposure to insecticides. He denies any other toxic exposures. He has never received any therapeutic radiation.  There is no family history of a blood disorder in any of his 9 siblings.   Past Medical History  Diagnosis Date  . ESRD on hemodialysis     Hemodialysis since 2003; Occluded access in both upper extremities and the right lower extremity, current access as of Aug 2014 is L thigh AVG. Gets HD MWF at [facility].     . Duodenal ulcer     remote  . Gout STABLE  . Arteriosclerotic cardiovascular disease (ASCVD) 1998    CABG-1998; h/o CHF; Atherosclerosis of great vessels, aorta and coronaries on CT in 02/2012  . Cerebrovascular disease   . Neuropathy of foot   . Degenerative joint disease   . Gastroesophageal reflux disease   . History of bladder cancer   . Impaired hearing     Bilateral; hearing aids relatively ineffective  . Anemia     Prior blood transfusion  . Hypertension   . Cholelithiasis 03/2011    Diagnosed incidentally on abdominal ultrasound  in 03/2011  . Hepatic steatosis   . Macrocytosis 10/20/2012  . Thrombocytopenia 10/20/2012  . Gallstones   . Renal cell carcinoma 2001    s/p right nephrectomy-2001; subsequent ESRD  . Hyperlipidemia   . Secondary hyperparathyroidism   . Peripheral vascular disease   :  Past Surgical History  Procedure  Laterality Date  . Right nephrectomy  2001    Renal cell carcinoma  . Colonoscopy  01/24/2011    prominent vascular pattern, suboptimal prep but doable  . Esophagogastroduodenoscopy  01/24/2011    mild erosive reflux esophagitis, bulbar/antral erosions, bx from antrum benign  . Av fistula repair  2008    revision of anastomosis of Right AVF  . Arteriovenous graft placement  2006    Thrombectomy and interposition jump graft revision to higher axillary vein of LUA AVG  . Coronary artery bypass graft  1998    X4 VESSELS  . Multiple cysto/ resection tumor's with bx's  LAST ONE 05-09-2011  . Multiple surg's / interventions for avgg/ fistula right upper arm  LAST REVISION 06-29-2011    (JUNE 2010 STENT CEPHALIC VEIN/ 03-23-2011 DILATATION ANGIOPLASTY)  . Left ptc left renal artery    . Cardiac catheterization  2004  . Cataract extraction w/ intraocular lens  implant, bilateral    . Cystoscopy w/ retrogrades  12/28/2011    Procedure: CYSTOSCOPY WITH RETROGRADE PYELOGRAM;  Surgeon: [name], MD;  Location: [facility];  Service: Urology;  Laterality: Left;  C-ARM   . Transurethral resection of bladder tumor  12/28/2011    Procedure: TRANSURETHRAL RESECTION OF BLADDER TUMOR (TURBT);  Surgeon: [name], MD;  Location: [facility];  Service: Urology;  Laterality: N/A;  . Av fistula placement  02/29/2012    Procedure: INSERTION OF ARTERIOVENOUS (AV) GORE-TEX GRAFT THIGH;  Surgeon: [name], MD;  Location: [facility] OR;  Service: Vascular;  Laterality: Right;  Insertion right femoral arteriovenous gortex graft  . Ligation of right braciocephalic av fistula  04-04-2012  . Insertion of dialysis catheter  06/05/2012    Procedure: INSERTION OF DIALYSIS CATHETER;  Surgeon: Pryor Ochoa, MD;  Location: [facility];  Service: Vascular;  Laterality: Left;  Insertion diatek catheter left IJ  . Insertion of dialysis catheter  08/22/2012    Procedure: INSERTION OF DIALYSIS CATHETER;   Surgeon: Nada Libman, MD;  Location: [facility];  Service: Vascular;  Laterality: Left;  insertion of dialysis catheter left  internal jugular 27cm  . Av fistula placement  10/08/2012    Procedure: INSERTION OF ARTERIOVENOUS (AV) GORE-TEX GRAFT THIGH;  Surgeon: Chuck Hint, MD;  Location: [facility] OR;  Service: Vascular;  Laterality: Left;  :  . albuterol  2.5 mg Nebulization BID  . allopurinol  100 mg Oral QHS  . aspirin EC  81 mg Oral QHS  . atorvastatin  10 mg Oral q morning - 10a  . ceFEPime (MAXIPIME) IV  2 g Intravenous Q M,W,F-2000  . cinacalcet  30 mg Oral Q breakfast  . [START ON 06/30/2013] darbepoetin (ARANESP) injection - DIALYSIS  200 mcg Intravenous Q Mon-HD  . doxercalciferol  1 mcg Intravenous Q M,W,F-HD  . gabapentin  100 mg Oral BID  . heparin  3,000 Units Dialysis Once in dialysis  . ipratropium  0.5 mg Nebulization BID  . midodrine  10 mg Oral TID WC  . pantoprazole  40 mg Oral Daily  . polyethylene glycol  17 g Oral Daily  . sevelamer  carbonate  3,200 mg Oral TID WC  . sodium chloride  3 mL Intravenous Q12H  . vancomycin  1,000 mg Intravenous Q M,W,F-HD  :  No Known Allergies:  Family History  Problem Relation Age of Onset  . Colon cancer Neg Hx   . Liver disease Neg Hx   . Anesthesia problems Neg Hx   . Heart disease Father   :  History   Social History  . Marital Status: Widowed    Spouse Name: N/A    Number of Children: 3  . Years of Education: N/A   Occupational History  . Retired    Social History Main Topics  . Smoking status: Former Smoker -- 1.00 packs/day for 25 years    Types: Cigarettes    Quit date: 02/26/1984  . Smokeless tobacco: Former Neurosurgeon     Comment: quit 1985  . Alcohol Use: No  . Drug Use: No  . Sexually Active: Not Currently   Other Topics Concern  . Not on file   Social History Narrative  . No narrative on file  :  ROS: Negative except as recorded in the history of present illness.   Vitals: Filed  Vitals:   06/29/13 1400  BP: 117/88  Pulse: 88  Temp: 97.5 F (36.4 C)  Resp: 17    PHYSICAL EXAM: General appearance: A well-nourished Caucasian man HEENT: Bilateral hearing aids. Pharynx no erythema or exudate. Neck full range of motion. Tiny subcutaneous nodule right supraclavicular region Lymph Nodes: No cervical, supraclavicular, or axillary adenopathy Resp: Bibasilar rales resonant to percussion Cardio: Regular rhythm. 2/6 systolic murmur at the left sternal border  Vascular: No cyanosis Breasts: GI: Abdomen soft, nontender, no mass, no organomegaly GU: Extremities: No edema, no calf tenderness Neurologic: Sensory neural hearing deficit. Peripheral neuropathy by history. Motor strength is 5 over 5. Reflexes 1+ symmetric Skin: No rash or ecchymosis  Labs:   Recent Labs  06/28/13 0851 06/29/13 0415  WBC 1.5* 1.5*  HGB 7.6* 6.7*  HCT 22.7* 20.1*  PLT 67* 60*    Recent Labs  06/27/13 0540 06/29/13 0415  NA 135 132*  K 5.1 4.2  CL 96 96  CO2 30 26  GLUCOSE 100* 117*  BUN 60* 60*  CREATININE 7.23* 6.66*  CALCIUM 7.9* 8.0*    Blood smear review: Dimorphic population of red cells macrocytic mixed with microcytic. No polychromasia. Decreased white cells. Predominant cell is a mature neutrophil with normal granulation and nuclear lobation. Platelets decreased in number normal in morphology.  Images Studies/Results:  Dg Chest Port 1 View  06/29/2013   *RADIOLOGY REPORT*  Clinical Data: Fever.  Shortness of breath.  PORTABLE CHEST - 1 VIEW  Comparison: Two-view chest x-ray 06/26/2013, 06/19/2013, 04/25/2013.  Findings: Prior sternotomy for CABG.  Cardiac silhouette mildly enlarged but stable.  Mild pulmonary venous hypertension and new mild diffuse interstitial pulmonary edema as evidenced by scattered Kerley B lines.  No confluent airspace consolidation.  No visible pleural effusions.  IMPRESSION: Mild CHF and/or fluid overload, with new mild diffuse interstitial  pulmonary edema.   Original Report Authenticated By: Hulan Saas, M.D.     Assessment: Active Problems:   SECONDARY HYPERPARATHYROIDISM   ANEMIA   Hypertension   ESRD (end stage renal disease)   CAD (coronary artery disease)   Other pancytopenia  Impression: Pancytopenia with macrocytic red cell indices in an elderly man with normal B12 and folic acid levels and an elevated ferritin.  I agree with previous evaluators that he  most likely has a myelodysplastic syndrome. Over the last year he has become transfusion-dependent for red cells despite Epogen given at time of dialysis treatments. He has had a number of admissions for infection likely related to his leukopenia.  Recommendation: I discussed his blood disorder with him and his 3 daughters. If we can establish a diagnosis of myelodysplasia by bone marrow biopsy, he might be a candidate for Revlimid. This immunomodulatory can result in trilineage bone marrow response in 30-50% of patients. The drug is cleared by the kidneys but it is possible to still use it in patients on dialysis. Before I would put somebody on this medication, I would have to secure the diagnosis. Patient and family understand this and he is willing to proceed with an elective bone marrow aspiration and biopsy which I will do while he is still in the hospital within the next few days. I will follow him in my office until we establish a diagnosis and outline a treatment plan and then refer him back to the [place] office for maintenance treatment.   Floyce Bujak M 06/29/2013, 2:27 PM

## 2013-06-29 NOTE — Evaluation (Signed)
Physical Therapy Evaluation Patient Details Name: Andrew [name] MRN: [id number] DOB: [birth year] Today's Date: 06/29/2013 Time: 4098-1191 PT Time Calculation (min): 19 min  PT Assessment / Plan / Recommendation History of Present Illness  This is an 79 year old male with a past medical history of coronary artery disease status post CABG, chronic heart failure, end-stage renal disease on hemodialysis, hypertension, chronic anemia and chronic thrombocytopenia. Pt with possible PNA and SIRS.  Clinical Impression  Pt admitted with above. Pt currently with functional limitations due to the deficits listed below (see PT Problem List).  Pt will benefit from skilled PT to increase their independence and safety with mobility to allow discharge to the venue listed below.       PT Assessment  Patient needs continued PT services    Follow Up Recommendations  Home health PT;Supervision - Intermittent (depending on progress)    Does the patient have the potential to tolerate intense rehabilitation      Barriers to Discharge        Equipment Recommendations  None recommended by PT    Recommendations for Other Services     Frequency Min 3X/week    Precautions / Restrictions Precautions Precautions: Fall   Pertinent Vitals/Pain Dyspnea 3/4 with activity on RA. SaO2 94% with activity on RA.      Mobility  Transfers Transfers: Sit to Stand;Stand to Sit Sit to Stand: 4: Min guard;With upper extremity assist;With armrests;From chair/3-in-1;From toilet Stand to Sit: 4: Min guard;With upper extremity assist;With armrests;To chair/3-in-1 Details for Transfer Assistance: Assist for safety. Ambulation/Gait Ambulation/Gait Assistance: 4: Min guard Ambulation Distance (Feet): 110 Feet Assistive device: Rolling walker Ambulation/Gait Assistance Details: Verbal cues to stay closer to walker. Gait Pattern: Step-through pattern;Decreased stride length Gait velocity: decr    Exercises     PT  Diagnosis: Difficulty walking;Generalized weakness  PT Problem List: Decreased knowledge of use of DME;Decreased strength;Decreased activity tolerance;Decreased balance;Decreased mobility PT Treatment Interventions: DME instruction;Gait training;Patient/family education;Functional mobility training;Therapeutic activities;Therapeutic exercise;Balance training     PT Goals(Current goals can be found in the care plan section) Acute Rehab PT Goals Patient Stated Goal: Return home PT Goal Formulation: With patient Time For Goal Achievement: 07/06/13 Potential to Achieve Goals: Good  Visit Information  Last PT Received On: 06/29/13 Assistance Needed: +1 History of Present Illness: This is an 79 year old male with a past medical history of coronary artery disease status post CABG, chronic heart failure, end-stage renal disease on hemodialysis, hypertension, chronic anemia and chronic thrombocytopenia.       Prior Functioning  Home Living Family/patient expects to be discharged to:: Private residence Living Arrangements: Alone Available Help at Discharge: Family;Available PRN/intermittently Type of Home: House Home Access: Level entry Home Layout: One level Home Equipment: Walker - 2 wheels;Cane - single point;Wheelchair - manual Prior Function Level of Independence: Independent with assistive device(s) Comments: Typically uses cane but has used RW recently. Communication Communication: HOH    Cognition  Cognition Arousal/Alertness: Awake/alert Behavior During Therapy: WFL for tasks assessed/performed Overall Cognitive Status: Within Functional Limits for tasks assessed    Extremity/Trunk Assessment Lower Extremity Assessment Lower Extremity Assessment: Generalized weakness   Balance Balance Balance Assessed: Yes Static Standing Balance Static Standing - Balance Support: No upper extremity supported;During functional activity Static Standing - Level of Assistance: 5: Stand by  assistance  End of Session PT - End of Session Activity Tolerance: Patient limited by fatigue Patient left: in chair;with call bell/phone within reach;with family/visitor present Nurse Communication: Mobility status  GP  Jelesa Mangini 06/29/2013, 4:39 PM  [facility] PT [phone]

## 2013-06-29 NOTE — Progress Notes (Signed)
ANTIBIOTIC CONSULT NOTE - FOLLOW UP  Pharmacy Consult for vancomycin, cefepime Indication: rule out pneumonia  No Known Allergies  Patient Measurements: Height: 5\' 9"  (175.3 cm) Weight: 168 lb 14 oz (76.6 kg) IBW/kg (Calculated) : 70.7   Vital Signs: Temp: 97.9 F (36.6 C) (08/03 1004) Temp src: Oral (08/03 1004) BP: 101/56 mmHg (08/03 1004) Pulse Rate: 93 (08/03 1004) Intake/Output from previous day: 08/02 0701 - 08/03 0700 In: 840 [P.O.:840] Out: -  Intake/Output from this shift: Total I/O In: 3 [I.V.:3] Out: -   Labs:  Recent Labs  06/26/13 2154  06/27/13 0540 06/28/13 0851 06/29/13 0415  WBC 1.7*  --  1.3* 1.5* 1.5*  HGB 9.2*  --  7.0* 7.6* 6.7*  PLT 87*  < > 68* 67* 60*  CREATININE 6.17*  --  7.23*  --  6.66*  < > = values in this interval not displayed. Estimated Creatinine Clearance: 8.1 ml/min (by C-G formula based on Cr of 6.66).   Microbiology: Recent Results (from the past 720 hour(s))  CULTURE, BLOOD (ROUTINE X 2)     Status: None   Collection Time    06/26/13  9:45 PM      Result Value Range Status   Specimen Description BLOOD RIGHT ARM   Final   Special Requests BOTTLES DRAWN AEROBIC AND ANAEROBIC 10CC   Final   Culture  Setup Time 06/27/2013 06:26   Final   Culture     Final   Value:        BLOOD CULTURE RECEIVED NO GROWTH TO DATE CULTURE WILL BE HELD FOR 5 DAYS BEFORE ISSUING A FINAL NEGATIVE REPORT   Report Status PENDING   Incomplete  CULTURE, BLOOD (ROUTINE X 2)     Status: None   Collection Time    06/26/13  9:56 PM      Result Value Range Status   Specimen Description BLOOD RIGHT HAND   Final   Special Requests BOTTLES DRAWN AEROBIC ONLY 5CC   Final   Culture  Setup Time 06/27/2013 06:26   Final   Culture     Final   Value:        BLOOD CULTURE RECEIVED NO GROWTH TO DATE CULTURE WILL BE HELD FOR 5 DAYS BEFORE ISSUING A FINAL NEGATIVE REPORT   Report Status PENDING   Incomplete  MRSA PCR SCREENING     Status: None   Collection  Time    06/27/13  1:41 AM      Result Value Range Status   MRSA by PCR NEGATIVE  NEGATIVE Final   Comment:            The GeneXpert MRSA Assay (FDA     approved for NASAL specimens     only), is one component of a     comprehensive MRSA colonization     surveillance program. It is not     intended to diagnose MRSA     infection nor to guide or     monitor treatment for     MRSA infections.    Anti-infectives   Start     Dose/Rate Route Frequency Ordered Stop   06/27/13 2000  ceFEPIme (MAXIPIME) 2 g in dextrose 5 % 50 mL IVPB     2 g 100 mL/hr over 30 Minutes Intravenous Every M-W-F (2000) 06/27/13 0038     06/27/13 1200  vancomycin (VANCOCIN) IVPB 1000 mg/200 mL premix     1,000 mg 200 mL/hr over 60 Minutes Intravenous Every  M-W-F (Hemodialysis) 06/26/13 2350     06/27/13 0800  piperacillin-tazobactam (ZOSYN) IVPB 2.25 g  Status:  Discontinued     2.25 g 100 mL/hr over 30 Minutes Intravenous Every 8 hours 06/26/13 2350 06/27/13 1405   06/27/13 0045  ceFEPIme (MAXIPIME) 2 g in dextrose 5 % 50 mL IVPB     2 g 100 mL/hr over 30 Minutes Intravenous  Once 06/27/13 0038 06/27/13 0143   06/27/13 0000  vancomycin (VANCOCIN) 1,750 mg in sodium chloride 0.9 % 500 mL IVPB     1,750 mg 250 mL/hr over 120 Minutes Intravenous STAT 06/26/13 2348 06/27/13 0218   06/26/13 2345  piperacillin-tazobactam (ZOSYN) IVPB 3.375 g     3.375 g 100 mL/hr over 30 Minutes Intravenous  Once 06/26/13 2343 06/27/13 0112   06/26/13 2345  vancomycin (VANCOCIN) IVPB 1000 mg/200 mL premix  Status:  Discontinued     1,000 mg 200 mL/hr over 60 Minutes Intravenous  Once 06/26/13 2343 06/27/13 0330      Assessment: 87 YOM with ESRD on HD MWF admitted with fatigue, generalized weakness and body aches, had been treated outpt with Levaquin for PNA and this was stopped a few days before admission. Now is on cefepime and vancomycin for possible HCAP. 8/3 CXR shows edema, plans are to repeat after dialysis tomorrow. ON  8/1, patient received vancomycin 1750mg  IV x1 at 0018, and vancomycin 1000mg  IV x1 at 0230. He had an HD session 8/1 as well (full 4 hours) and received vanc 1000mg  IV x1 after that as well. Patient did receive cefepime after that HD session as well.  Goal of Therapy:  pre-HD vanc level 15-63mcg/mL  Plan:  1. Pre-HD level before 8/4 HD 2. If level ok, continue vancomycin 1000mg  IV MWF after HD 3. Continue cefepime 2g IV MWF after HD 4. Follow up repeat CXR, LOT with antibiotics, cultures/sensitivities, and HD schedule  Honora Searson D. Eaton Folmar, PharmD Clinical Pharmacist Pager: [phone] 06/29/2013 10:41 AM

## 2013-06-29 NOTE — Progress Notes (Addendum)
[facility] PROGRESS NOTE  Andrew [name] [name] DOB: [birth year] DOA: 06/26/2013 PCP: [name]., MD  Assessment/Plan: Active Problems:   SECONDARY HYPERPARATHYROIDISM   ANEMIA   Hypertension   ESRD (end stage renal disease)   CAD (coronary artery disease)   Other pancytopenia    Brief narrative:  This is an 79 year old male with a past medical history of coronary artery disease status post CABG, chronic heart failure, end-stage renal disease on hemodialysis, hypertension, chronic anemia and chronic thrombocytopenia.  He was seen in the ER on 7/19 with a complaint of chest pain and shortness of breath. He was diagnosed with community-acquired pneumonia and placed on oral Levaquin which he apparently completed about 6 days prior to admit.  On 7/31, patient was brought to the ER by his daughter with a complaint of shortness of lethargy, fevers and intermittent confusion  Assessment/Plan:  Principal Problem:  SIRS- Temp 102/COPD flare-improving  - follow up on blood cultures  - pt does not make urine- no UA  - if it is pneumonia, is should be covered as HCAP rather than CAP (note outpt Levaquin was inadequate coverage)  -He is currently receiving vancomycin, Zosyn and Cefepime- d/c'd Zosyn on 8/1- Vanc and Cefepime are adequate coverage.  - repeat CXR after dialysis   Active Problems:  End-stage renal disease on hemodialysis/renal cell cancer  -Nephrology has been consulted  Monday Wednesday Friday in [place]   Pulmonary edema  - resolved after dialysis- no longer requiring O2   Chronic pancytopenia  - sees hematology as oupt- Dr Mariel Sleet  - Counts are lower than usual  ho RCC/Bladder CA -Follow carefully  -Received blood transfusion on 7/28 when hemoglobin was 7.3 bringing it up to 9.2  Getting another unit transfused on 8/3 Hematology consult for pancytopenia  Suspected to have MDS  Has declined BMBX in the past   Peripheral neuropathy  On  neurontin as outpt- will resume  Gout  -on allopurinol  Hypotension  -takes Midodrine on dialysis days    CAD/ CABG    Code Status: full  Family Communication: none  Disposition Plan: Continue on telemetry Consultants:  nephro  Procedures:  none  Antibiotics:  Vanc/ Zosyn and Cefepime 7/31>>  DVT prophylaxis:  SCDs  HPI/Subjective:  Pt alert and oriented today- quite restless - RN asking for sitter as pt pulling on lines     Objective: Filed Vitals:   06/28/13 1800 06/28/13 2018 06/28/13 2052 06/29/13 0543  BP:  103/49  104/52  Pulse:  88  94  Temp:  97.8 F (36.6 C)  97.8 F (36.6 C)  TempSrc:  Oral  Oral  Resp: 21 20  20   Height:  5\' 9"  (1.753 m)    Weight:  76.6 kg (168 lb 14 oz)    SpO2:  98% 97% 95%    Intake/Output Summary (Last 24 hours) at 06/29/13 0838 Last data filed at 06/29/13 0500  Gross per 24 hour  Intake    840 ml  Output      0 ml  Net    840 ml    Exam:  General: Alert , Nad, chronically ill WM  Heart: RRR, 1/6 sem lsb  Lungs:faint bilat. Wheezing Improved from yesterday ( just had breathing tx this AM)  Abdomen: bs pos. Soft ,nontender  Extremities: Dialysis Access: No Pedal edema, pos. Bruit L Fem . AVGG     Data Reviewed: Basic Metabolic Panel:  Recent Labs Lab 06/26/13 2154 06/27/13 0540 06/29/13 0415  NA 134* 135 132*  K 5.2* 5.1 4.2  CL 90* 96 96  CO2 32 30 26  GLUCOSE 106* 100* 117*  BUN 51* 60* 60*  CREATININE 6.17* 7.23* 6.66*  CALCIUM 8.6 7.9* 8.0*  PHOS  --   --  3.1    Liver Function Tests:  Recent Labs Lab 06/26/13 2154 06/27/13 0540 06/29/13 0415  AST 39* 31  --   ALT 20 18  --   ALKPHOS 136* 97  --   BILITOT 0.7 0.9  --   PROT 6.9 5.5*  --   ALBUMIN 3.5 2.8* 2.7*   No results found for this basename: LIPASE, AMYLASE,  in the last 168 hours No results found for this basename: AMMONIA,  in the last 168 hours  CBC:  Recent Labs Lab 06/23/13 1100 06/26/13 2154 06/27/13 0238 06/27/13 0540  06/28/13 0851 06/29/13 0415  WBC  --  1.7*  --  1.3* 1.5* 1.5*  NEUTROABS  --  0.9*  --  0.7*  --   --   HGB 7.3* 9.2*  --  7.0* 7.6* 6.7*  HCT 22.0* 27.5*  --  21.5* 22.7* 20.1*  MCV  --  109.6*  --  109.1* 109.7* 106.3*  PLT  --  87* 76* 68* 67* 60*    Cardiac Enzymes: No results found for this basename: CKTOTAL, CKMB, CKMBINDEX, TROPONINI,  in the last 168 hours BNP (last 3 results)  Recent Labs  06/14/13 2309  PROBNP 25066.0*     CBG:  Recent Labs Lab 06/28/13 0820 06/28/13 1215 06/28/13 1726 06/28/13 2021 06/29/13 0739  GLUCAP 97 87 98 100* 94    Recent Results (from the past 240 hour(s))  CULTURE, BLOOD (ROUTINE X 2)     Status: None   Collection Time    06/26/13  9:45 PM      Result Value Range Status   Specimen Description BLOOD RIGHT ARM   Final   Special Requests BOTTLES DRAWN AEROBIC AND ANAEROBIC 10CC   Final   Culture  Setup Time 06/27/2013 06:26   Final   Culture     Final   Value:        BLOOD CULTURE RECEIVED NO GROWTH TO DATE CULTURE WILL BE HELD FOR 5 DAYS BEFORE ISSUING A FINAL NEGATIVE REPORT   Report Status PENDING   Incomplete  CULTURE, BLOOD (ROUTINE X 2)     Status: None   Collection Time    06/26/13  9:56 PM      Result Value Range Status   Specimen Description BLOOD RIGHT HAND   Final   Special Requests BOTTLES DRAWN AEROBIC ONLY 5CC   Final   Culture  Setup Time 06/27/2013 06:26   Final   Culture     Final   Value:        BLOOD CULTURE RECEIVED NO GROWTH TO DATE CULTURE WILL BE HELD FOR 5 DAYS BEFORE ISSUING A FINAL NEGATIVE REPORT   Report Status PENDING   Incomplete  MRSA PCR SCREENING     Status: None   Collection Time    06/27/13  1:41 AM      Result Value Range Status   MRSA by PCR NEGATIVE  NEGATIVE Final   Comment:            The GeneXpert MRSA Assay (FDA     approved for NASAL specimens     only), is one component of a     comprehensive MRSA colonization  surveillance program. It is not     intended to diagnose  MRSA     infection nor to guide or     monitor treatment for     MRSA infections.     Studies: Dg Chest 2 View  06/26/2013   *RADIOLOGY REPORT*  Clinical Data: Fever and fatigue.  History of end-stage renal disease and bladder carcinoma.  CHEST - 2 VIEW  Comparison: 06/19/2013  Findings: Chronic pulmonary venous hypertensive changes are present which appear slightly more prominent compared to the prior chest x- ray.  No overt airspace edema or pleural fluid is identified.  The heart size is stable size post prior CABG.  Mild degenerative changes are present in the thoracic spine.  IMPRESSION: Chronic pulmonary venous hypertensive changes which may be more slightly more prominent.  However, no overt airspace edema is identified.   Original Report Authenticated By: Irish Lack, M.D.   Dg Chest 2 View  06/19/2013   *RADIOLOGY REPORT*  Clinical Data: Shortness of breath.  Coughing.  Ex-smoker.  CHEST - 2 VIEW  Comparison: 03/24/2013.  06/14/2013.  Findings: Cardiac silhouette is borderline enlarged. The patient has undergone previous median sternotomy and coronary artery bypass grafting. Ectasia and nonaneurysmal calcification of the thoracic aorta are seen.  Mediastinal and hilar contours appear stable. Slight prominence of left hilum is felt to be vascular and unchanged.  No pulmonary infiltrative densities or masses are seen. There appears to be a right subclavian stent.  Surgical clips are seen in the left axillary region.  There is slight flattening of the diaphragm on lateral image which may reflect minimal hyperinflation.  There is minimal central peribronchial thickening. There is minimal degenerative spondylosis.  IMPRESSION: Borderline cardiac silhouette enlargement.  Slight flattening of diaphragm on lateral image may reflect minimal hyperinflation. There is minimal central peribronchial thickening.  No consolidation or pleural effusion.   Original Report Authenticated By: Onalee Hua Call   Dg  Chest Port 1 View  06/14/2013   *RADIOLOGY REPORT*  Clinical Data: Short of breath.  Chest discomfort.  PORTABLE CHEST - 1 VIEW  Comparison: 04/25/2013  Findings: There is some mild patchy airspace and coarse reticular opacity in the right lung base.  This could reflect an infiltrate. It may reflect asymmetric edema.  The lungs otherwise clear.  No pleural effusion or pneumothorax.  The cardiac silhouette is normal in size and configuration. Changes from CABG surgery are stable.  No mediastinal or hilar masses.  IMPRESSION: Mild opacity at the right lung base suggest a small area of infiltrate or asymmetric edema.  It is new from the prior study.   Original Report Authenticated By: Amie Portland, M.D.    Scheduled Meds: . albuterol  2.5 mg Nebulization BID  . allopurinol  100 mg Oral QHS  . aspirin EC  81 mg Oral QHS  . atorvastatin  10 mg Oral q morning - 10a  . ceFEPime (MAXIPIME) IV  2 g Intravenous Q M,W,F-2000  . cinacalcet  30 mg Oral Q breakfast  . darbepoetin  150 mcg Subcutaneous Q Fri-HD  . doxercalciferol  1 mcg Intravenous Q M,W,F-HD  . gabapentin  100 mg Oral BID  . heparin  3,000 Units Dialysis Once in dialysis  . ipratropium  0.5 mg Nebulization BID  . midodrine  10 mg Oral TID WC  . pantoprazole  40 mg Oral Daily  . polyethylene glycol  17 g Oral Daily  . sevelamer carbonate  3,200 mg Oral TID WC  . sodium  chloride  3 mL Intravenous Q12H  . vancomycin  1,000 mg Intravenous Q M,W,F-HD   Continuous Infusions:   Active Problems:   SECONDARY HYPERPARATHYROIDISM   ANEMIA   Hypertension   ESRD (end stage renal disease)   CAD (coronary artery disease)   Other pancytopenia    Time spent: 40 minutes   [facility]  [facility] Pager [phone]. If 8PM-8AM, please contact night-coverage at [email or web address], password [facility] 06/29/2013, 8:38 AM  LOS: 3 days

## 2013-06-30 DIAGNOSIS — J189 Pneumonia, unspecified organism: Secondary | ICD-10-CM

## 2013-06-30 DIAGNOSIS — D649 Anemia, unspecified: Secondary | ICD-10-CM

## 2013-06-30 DIAGNOSIS — I359 Nonrheumatic aortic valve disorder, unspecified: Secondary | ICD-10-CM

## 2013-06-30 LAB — CBC WITH DIFFERENTIAL/PLATELET
Basophils Relative: 0 % (ref 0–1)
Eosinophils Absolute: 0.1 10*3/uL (ref 0.0–0.7)
MCH: 36 pg — ABNORMAL HIGH (ref 26.0–34.0)
MCHC: 35.4 g/dL (ref 30.0–36.0)
Monocytes Absolute: 0.3 10*3/uL (ref 0.1–1.0)
Neutrophils Relative %: 59 % (ref 43–77)
Platelets: 86 10*3/uL — ABNORMAL LOW (ref 150–400)

## 2013-06-30 LAB — RENAL FUNCTION PANEL
CO2: 20 mEq/L (ref 19–32)
Calcium: 6.8 mg/dL — ABNORMAL LOW (ref 8.4–10.5)
Creatinine, Ser: 7.41 mg/dL — ABNORMAL HIGH (ref 0.50–1.35)
GFR calc non Af Amer: 6 mL/min — ABNORMAL LOW (ref >90)

## 2013-06-30 LAB — GLUCOSE, CAPILLARY
Glucose-Capillary: 87 mg/dL (ref 70–99)
Glucose-Capillary: 149 mg/dL — ABNORMAL HIGH (ref 70–99)
Glucose-Capillary: 109 mg/dL — ABNORMAL HIGH (ref 70–99)

## 2013-06-30 MED ORDER — IPRATROPIUM BROMIDE 0.02 % IN SOLN
0.5000 mg | RESPIRATORY_TRACT | Status: DC | PRN
  Administered 2013-06-30: 0.5 mg via RESPIRATORY_TRACT

## 2013-06-30 MED ORDER — VANCOMYCIN HCL IN DEXTROSE 750-5 MG/150ML-% IV SOLN
750.0000 mg | INTRAVENOUS | Status: DC
  Administered 2013-07-02: 750 mg via INTRAVENOUS
  Filled 2013-06-30 (×2): qty 150

## 2013-06-30 MED ORDER — ALBUTEROL SULFATE (5 MG/ML) 0.5% IN NEBU
2.5000 mg | INHALATION_SOLUTION | RESPIRATORY_TRACT | Status: DC | PRN
  Administered 2013-06-30: 2.5 mg via RESPIRATORY_TRACT

## 2013-06-30 NOTE — Progress Notes (Signed)
[facility] PROGRESS NOTE  Andrew [name] [name] DOB: [birth year] DOA: 06/26/2013 PCP: Andrew Smiles., MD  Brief narrative:  79 year old male with history of coronary artery disease status post CABG, chronic heart failure, end-stage Andrew [name] disease on hemodialysis, hypertension, chronic anemia and chronic thrombocytopenia presented to the ER on 7/19 with a complaint of chest pain and shortness of breath. He was diagnosed with community-acquired pneumonia and placed on oral Levaquin which he apparently completed about 6 days prior to admit. On 7/31, patient was brought to the ER by his daughter with a complaint of shortness of breath, fevers, lethargy ,  intermittent confusion with findings of SIRS.  Assessment/Plan:   Principal Problem:   SIRS Likely secondary to healthcare associated pneumonia Patient currently on IV vancomycin and cefepime - follow up on blood cultures    Active Problems:  End-stage Andrew [name] disease on hemodialysis/Andrew [name] cell cancer  -Andrew [name] following Continue with scheduled dialysis  Pulmonary edema  - resolved after dialysis  Chronic pancytopenia  - Followed with Andrew [name] as outpatient. Suspected myelodysplastic syndrome however patient was declining bone marrow bx repeatedly  as outpatient. Seen by Andrew [name] and patient of the basilar bone marrow biopsy this admission. If Myelodysplasia seen on bone marrow he suggests patient might be a candidate for revlimid. Received 2 Unit PRBC since admission  ho RCC/Bladder CA  S/p nephrectomy in 2001   Peripheral neuropathy  On neurontin   Gout  -on allopurinol   Hypotension  -takes Midodrine on dialysis days   CAD/ CABG  Continue aspirin and Lipitor   DVT prophylaxis:  SCDs    Code Status: full  Family Communication: none  Disposition Plan: Home once improved  Consultants:  Andrew [name] Hematology  Procedures:  none   Antibiotics:  Vanc/  and Cefepime 7/31>>    HPI/Subjective:   Patient seen and examined this morning. Hard-hitting. Daughter at bedside. Denies any symptoms. Overnight had short runs of wide QRS. Asymptomatic.   Objective: Filed Vitals:   06/30/13 1140 06/30/13 1200 06/30/13 1230 06/30/13 1330  BP: 106/57 145/46 117/70 94/48  Pulse: 92 74 82 71  Temp:      TempSrc:      Resp: 17 16 18 17   Height:      Weight:      SpO2:        Intake/Output Summary (Last 24 hours) at 06/30/13 1401 Last data filed at 06/30/13 9562  Gross per 24 hour  Intake   1130 ml  Output      0 ml  Net   1130 ml   Filed Weights   06/28/13 2018 06/29/13 2027 06/30/13 1130  Weight: 76.6 kg (168 lb 14 oz) 76.601 kg (168 lb 14 oz) 77.1 kg (169 lb 15.6 oz)    Exam:   General: Elderly male in no acute distress, hard of hearing  HEENT: No pallor, moist oral mucosa  Chest: Clear to auscultation bilaterally, no added sounds  CVS: Normal S1-S2, no murmurs rub or gallop  Abdomen: Soft, nontender, nondistended, bowel sounds present  Extremities: Warm, no edema  CNS: AAO x3   Data Reviewed: Basic Metabolic Panel:  Recent Labs Lab 06/26/13 2154 06/27/13 0540 06/29/13 0415 06/30/13 1138  NA 134* 135 132* 132*  K 5.2* 5.1 4.2 5.0  CL 90* 96 96 99  CO2 32 30 26 20   GLUCOSE 106* 100* 117* 105*  BUN 51* 60* 60* 76*  CREATININE 6.17* 7.23* 6.66* 7.41*  CALCIUM 8.6 7.9* 8.0* 6.8*  PHOS  --   --  3.1 3.4   Liver Function Tests:  Recent Labs Lab 06/26/13 2154 06/27/13 0540 06/29/13 0415 06/30/13 1138  AST 39* 31  --   --   ALT 20 18  --   --   ALKPHOS 136* 97  --   --   BILITOT 0.7 0.9  --   --   PROT 6.9 5.5*  --   --   ALBUMIN 3.5 2.8* 2.7* 2.2*   No results found for this basename: LIPASE, AMYLASE,  in the last 168 hours No results found for this basename: AMMONIA,  in the last 168 hours CBC:  Recent Labs Lab 06/26/13 2154 06/27/13 0238 06/27/13 0540 06/28/13 0851 06/29/13 0415 06/30/13 0859  WBC 1.7*  --  1.3* 1.5* 1.5* 3.0*   NEUTROABS 0.9*  --  0.7*  --   --  1.8  HGB 9.2*  --  7.0* 7.6* 6.7* 9.0*  HCT 27.5*  --  21.5* 22.7* 20.1* 25.4*  MCV 109.6*  --  109.1* 109.7* 106.3* 101.6*  PLT 87* 76* 68* 67* 60* 86*   Cardiac Enzymes:  Recent Labs Lab 06/29/13 1803 06/29/13 2330  TROPONINI <0.30 <0.30   BNP (last 3 results)  Recent Labs  06/14/13 2309  PROBNP 25066.0*   CBG:  Recent Labs Lab 06/29/13 1221 06/29/13 1641 06/29/13 2030 06/30/13 0736 06/30/13 1114  GLUCAP 87 96 103* 87 149*    Recent Results (from the past 240 hour(s))  CULTURE, BLOOD (ROUTINE X 2)     Status: None   Collection Time    06/26/13  9:45 PM      Result Value Range Status   Specimen Description BLOOD RIGHT ARM   Final   Special Requests BOTTLES DRAWN AEROBIC AND ANAEROBIC 10CC   Final   Culture  Setup Time 06/27/2013 06:26   Final   Culture     Final   Value:        BLOOD CULTURE RECEIVED NO GROWTH TO DATE CULTURE WILL BE HELD FOR 5 DAYS BEFORE ISSUING A FINAL NEGATIVE REPORT   Report Status PENDING   Incomplete  CULTURE, BLOOD (ROUTINE X 2)     Status: None   Collection Time    06/26/13  9:56 PM      Result Value Range Status   Specimen Description BLOOD RIGHT HAND   Final   Special Requests BOTTLES DRAWN AEROBIC ONLY 5CC   Final   Culture  Setup Time 06/27/2013 06:26   Final   Culture     Final   Value:        BLOOD CULTURE RECEIVED NO GROWTH TO DATE CULTURE WILL BE HELD FOR 5 DAYS BEFORE ISSUING A FINAL NEGATIVE REPORT   Report Status PENDING   Incomplete  MRSA PCR SCREENING     Status: None   Collection Time    06/27/13  1:41 AM      Result Value Range Status   MRSA by PCR NEGATIVE  NEGATIVE Final   Comment:            The GeneXpert MRSA Assay (FDA     approved for NASAL specimens     only), is one component of a     comprehensive MRSA colonization     surveillance program. It is not     intended to diagnose MRSA     infection nor to guide or     monitor treatment for     MRSA infections.      Studies:  Dg Chest Port 1 View  06/29/2013   *RADIOLOGY REPORT*  Clinical Data: Fever.  Shortness of breath.  PORTABLE CHEST - 1 VIEW  Comparison: Two-view chest x-ray 06/26/2013, 06/19/2013, 04/25/2013.  Findings: Prior sternotomy for CABG.  Cardiac silhouette mildly enlarged but stable.  Mild pulmonary venous hypertension and new mild diffuse interstitial pulmonary edema as evidenced by scattered Kerley B lines.  No confluent airspace consolidation.  No visible pleural effusions.  IMPRESSION: Mild CHF and/or fluid overload, with new mild diffuse interstitial pulmonary edema.   Original Report Authenticated By: Hulan Saas, M.D.    Scheduled Meds: . albuterol  2.5 mg Nebulization BID  . allopurinol  100 mg Oral QHS  . aspirin EC  81 mg Oral QHS  . atorvastatin  10 mg Oral q morning - 10a  . ceFEPime (MAXIPIME) IV  2 g Intravenous Q M,W,F-2000  . cinacalcet  30 mg Oral Q breakfast  . darbepoetin (ARANESP) injection - DIALYSIS  200 mcg Intravenous Q Mon-HD  . doxercalciferol  1 mcg Intravenous Q M,W,F-HD  . gabapentin  100 mg Oral BID  . ipratropium  0.5 mg Nebulization BID  . midodrine  10 mg Oral TID WC  . pantoprazole  40 mg Oral Daily  . polyethylene glycol  17 g Oral Daily  . sevelamer carbonate  3,200 mg Oral TID WC  . sodium chloride  3 mL Intravenous Q12H  . vancomycin  1,000 mg Intravenous Q M,W,F-HD   Continuous Infusions:      Time spent:25 minutes    Yamilette Garretson  [facility] Pager [phone] If 7PM-7AM, please contact night-coverage at [email or web address], password [facility] 06/30/2013, 2:01 PM  LOS: 4 days

## 2013-06-30 NOTE — Procedures (Signed)
Patient seen on Hemodialysis. QB 400, UF goal 3.5L Treatment adjusted as needed.  Zetta Bills MD [facility]. Office # [phone] Pager # [phone] 11:42 AM

## 2013-06-30 NOTE — Progress Notes (Signed)
Echo Lab  2D Echocardiogram completed.  Nikolay Demetriou L Tamkia Temples, RDCS 06/30/2013 8:35 AM

## 2013-06-30 NOTE — Progress Notes (Signed)
I have personally seen and examined this patient and agree with the assessment/plan as outlined above by [name] PA. Micharl Helmes K.,MD 06/30/2013 11:41 AM

## 2013-06-30 NOTE — Progress Notes (Signed)
Pt is requesting permission to shower. MD notified. Will continue to monitor.

## 2013-06-30 NOTE — Progress Notes (Signed)
Subjective:  Comfortable in chair, alert, oriented, but feels weak, mild dyspnea on exertion  Objective: Vital signs in last 24 hours: Temp:  [97.1 F (36.2 C)-98.1 F (36.7 C)] 98.1 F (36.7 C) (08/04 0623) Pulse Rate:  [69-97] 75 (08/04 0623) Resp:  [17-20] 18 (08/04 0623) BP: (101-126)/(49-88) 111/52 mmHg (08/04 0623) SpO2:  [94 %-99 %] 99 % (08/04 0623) Weight:  [76.601 kg (168 lb 14 oz)] 76.601 kg (168 lb 14 oz) (08/03 2027) Weight change: 0.001 kg (0 oz)  Intake/Output from previous day: 08/03 0701 - 08/04 0700 In: 1373 [P.O.:600; I.V.:53; Blood:360] Out: -  Intake/Output this shift: Total I/O In: 360 [P.O.:360] Out: -   Lab Results:  Recent Labs  06/28/13 0851 06/29/13 0415  WBC 1.5* 1.5*  HGB 7.6* 6.7*  HCT 22.7* 20.1*  PLT 67* 60*   BMET:  Recent Labs  06/29/13 0415  NA 132*  K 4.2  CL 96  CO2 26  GLUCOSE 117*  BUN 60*  CREATININE 6.66*  CALCIUM 8.0*  ALBUMIN 2.7*   No results found for this basename: PTH,  in the last 72 hours Iron Studies:  Recent Labs  06/27/13 1844  IRON 175*  TIBC 205*   EXAM: General appearance:  Alert, in no apparent distress Resp: Faint wheezes bilaterally, but mostly clear  Cardio:  RRR with Gr II/VI systolic murmur, no rub GI:  + BS, soft and nontender Extremities:  No edema Access:  L thigh AVG with + bruit  Outpatient HD ([place] MWF)  4hrs F160 Dry wt 73.5kg Bath 2K, 2.25Ca Prof 2 Heparin 3000 L thigh AVG  Hect 1ug EPO 20000 Venofer none   Assessment/Plan: 1. Dyspnea - secondary to COPD (nebs), anemia (s/p transfusions), pulmonary edema (per CXR 8/3), ? PNA (recently on PO Levaquin) fever resolved, on Vancomycin & Cefepime.  2. Pancytopenia - chronic, Hx RCC & bladder cancer; evaluated by Hematology, likely myelodysplastic syndrome, bone marrow biopsy this week.  3. AMS - resolved. 4. ESRD - HD on MWF @ [facility]; K 4.2.  HD pending. 5. Hypotension/ Volume - BP 111/52 on Midodrine 10 mg tid; CXR shows CHF, 3  L over EDW.   6. Anemia - Hgb 6.6 yesterday, max outpatient Epogen, s/p transfusions 7/29 & 8/3, Aranesp 200 mcg today. 7. Sec HPT - Ca 8 (9 corrected), P 3.1; Hectorol 1 mcg, Sensipar 30 mg qd, Renvela 4 with meals.   8. Hx RCC & bladder cancer - s/p nephrectomy 2001.   LOS: 4 days   Andrew [name] 06/30/2013,6:37 AM

## 2013-06-30 NOTE — Progress Notes (Signed)
CRITICAL VALUE ALERT  Critical value received:  Troponin 0.31  Date of notification:  06/30/13  Time of notification:  12:14  Critical value read back: yes  Nurse who received alert:  Sharen Heck, RN   MD notified (1st page):  R. Reidler, PA  Time of first page:  12:21  MD notified (2nd page):  Time of second page:  Responding MD:    Time MD responded:

## 2013-06-30 NOTE — Progress Notes (Signed)
ANTIBIOTIC CONSULT NOTE - FOLLOW UP  Pharmacy Consult for Vancomycin and Cefepime Indication: rule out pneumonia  No Known Allergies  Patient Measurements: Height: 5\' 9"  (175.3 cm) Weight: 163 lb 2.3 oz (74 kg) IBW/kg (Calculated) : 70.7   Vital Signs: Temp: 97.8 F (36.6 C) (08/04 1544) Temp src: Oral (08/04 1544) BP: 100/53 mmHg (08/04 1545) Pulse Rate: 92 (08/04 1545) Intake/Output from previous day: 08/03 0701 - 08/04 0700 In: 1373 [P.O.:600; I.V.:53; Blood:360] Out: -  Intake/Output from this shift: Total I/O In: -  Out: 3000 [Other:3000]  Labs:  Recent Labs  06/28/13 0851 06/29/13 0415 06/30/13 0859 06/30/13 1138  WBC 1.5* 1.5* 3.0*  --   HGB 7.6* 6.7* 9.0*  --   PLT 67* 60* 86*  --   CREATININE  --  6.66*  --  7.41*   Estimated Creatinine Clearance: 7.3 ml/min (by C-G formula based on Cr of 7.41).   Antibiotics and Microbiology:  Vanco 8/1 >> Cefepime 8/1>> Zosyn 8/1>>8/1 (received 3.375 x1 and 2.25x1)  7/31 Blood x 2-NGTD MRSA Neg   Assessment: 79 YO M with ESRD on HD MWF admitted with fatigue, generalized weakness and body aches, had been treated outpt with Levaquin for PNA and this was stopped a few days before admission. Now is on cefepime and vancomycin for possible HCAP. On 8/1, patient received vancomycin 1750mg  IV x1 at 0018, and vancomycin 1000mg  IV x1 at 0230. He had an HD session 8/1 as well (full 4 hours) and received vanc 1000mg  IV x1 after that as well. There was initial concern regarding excessive Vancomycin dosing so Vancomycin level pre-HD was checked today.  Vanc level = 22 (goal 20-25).  Anticipate some accumulation of Vancomycin with continued dosing as patient is not yet at steady state.  Also noted weight change with repeated fluid removal with HD.  Current weight = 74 kg.   Goal of Therapy:  pre-HD vanc level 15-78mcg/mL  Plan:  1. Change Vancomycin to 750 mg IV qHD (next dose due 8/6).   2. Continue cefepime 2g IV MWF after  HD 3. Follow up repeat CXR, LOT with antibiotics, cultures/sensitivities, and HD schedule  [employer], Pharm.D., BCPS Clinical Pharmacist Pager [phone] 06/30/2013 4:05 PM

## 2013-07-01 ENCOUNTER — Encounter (HOSPITAL_COMMUNITY): Payer: Self-pay | Admitting: Nurse Practitioner

## 2013-07-01 DIAGNOSIS — I2510 Atherosclerotic heart disease of native coronary artery without angina pectoris: Secondary | ICD-10-CM

## 2013-07-01 DIAGNOSIS — I5021 Acute systolic (congestive) heart failure: Secondary | ICD-10-CM

## 2013-07-01 DIAGNOSIS — N049 Nephrotic syndrome with unspecified morphologic changes: Secondary | ICD-10-CM

## 2013-07-01 DIAGNOSIS — I509 Heart failure, unspecified: Secondary | ICD-10-CM | POA: Diagnosis present

## 2013-07-01 DIAGNOSIS — I5043 Acute on chronic combined systolic (congestive) and diastolic (congestive) heart failure: Secondary | ICD-10-CM

## 2013-07-01 DIAGNOSIS — I279 Pulmonary heart disease, unspecified: Secondary | ICD-10-CM

## 2013-07-01 LAB — CBC WITH DIFFERENTIAL/PLATELET
Band Neutrophils: 0 % (ref 0–10)
Blasts: 0 %
HCT: 23.1 % — ABNORMAL LOW (ref 39.0–52.0)
Lymphocytes Relative: 34 % (ref 12–46)
Lymphs Abs: 0.9 10*3/uL (ref 0.7–4.0)
Monocytes Absolute: 0.2 10*3/uL (ref 0.1–1.0)
Monocytes Relative: 9 % (ref 3–12)
Neutro Abs: 1.5 10*3/uL — ABNORMAL LOW (ref 1.7–7.7)
Neutrophils Relative %: 55 % (ref 43–77)
Platelets: 103 10*3/uL — ABNORMAL LOW (ref 150–400)
RDW: 22.4 % — ABNORMAL HIGH (ref 11.5–15.5)
WBC: 2.7 10*3/uL — ABNORMAL LOW (ref 4.0–10.5)
nRBC: 0 /100 WBC

## 2013-07-01 LAB — GLUCOSE, CAPILLARY
Glucose-Capillary: 115 mg/dL — ABNORMAL HIGH (ref 70–99)
Glucose-Capillary: 99 mg/dL (ref 70–99)
Glucose-Capillary: 119 mg/dL — ABNORMAL HIGH (ref 70–99)

## 2013-07-01 LAB — IRON AND TIBC

## 2013-07-01 NOTE — Progress Notes (Signed)
Subjective:  Breathing better, feeling better, states that bone marrow biopsy will be today  Objective: Vital signs in last 24 hours: Temp:  [97.5 F (36.4 C)-98.3 F (36.8 C)] 98.2 F (36.8 C) (08/05 0544) Pulse Rate:  [71-92] 89 (08/05 0544) Resp:  [15-18] 16 (08/05 0544) BP: (89-145)/(38-93) 109/58 mmHg (08/05 0544) SpO2:  [93 %-99 %] 95 % (08/05 0544) Weight:  [74 kg (163 lb 2.3 oz)-77.1 kg (169 lb 15.6 oz)] 74.503 kg (164 lb 4 oz) (08/04 2111) Weight change: 0.499 kg (1 lb 1.6 oz)  Intake/Output from previous day: 08/04 0701 - 08/05 0700 In: 240 [P.O.:240] Out: 3000  Intake/Output this shift: Total I/O In: 240 [P.O.:240] Out: -   Lab Results:  Recent Labs  06/29/13 0415 06/30/13 0859  WBC 1.5* 3.0*  HGB 6.7* 9.0*  HCT 20.1* 25.4*  PLT 60* 86*   BMET:  Recent Labs  06/29/13 0415 06/30/13 1138  NA 132* 132*  K 4.2 5.0  CL 96 99  CO2 26 20  GLUCOSE 117* 105*  BUN 60* 76*  CREATININE 6.66* 7.41*  CALCIUM 8.0* 6.8*  ALBUMIN 2.7* 2.2*   No results found for this basename: PTH,  in the last 72 hours Iron Studies:  Recent Labs  06/30/13 1200  IRON 186*  TIBC NOT CALC  FERRITIN 1863*   EXAM: General appearance:  Alert, in no apparent distress Resp:  CTA without rales, rhonchi, or wheezes Cardio:  RRR with Gr II/VI systolic murmur, no rub GI:  + BS, soft and nontender Extremities:  No edema Access:  L thigh AVG with + bruit  Outpatient HD ([facility] MWF)  4hrs F160 Dry wt 73.5kg Bath 2K, 2.25Ca Prof 2 Heparin 3000 L thigh AVG  Hect 1ug EPO 20000 Venofer none   Assessment/Plan: 1. Dyspnea - improved; secondary to COPD (nebs), anemia (s/p transfusions), pulmonary edema (per CXR 8/3), ? PNA (recently on PO Levaquin) fever resolved, on Vancomycin & Cefepime.  2. Pancytopenia - chronic, Hx RCC & bladder cancer; evaluated by Hematology, likely myelodysplastic syndrome, bone marrow biopsy this week.  3. AMS - resolved.  4. ESRD - HD on MWF @ [facility]; K 5.  HD tomorrow.  5. Hypotension/ Volume - BP 109/58 on Midodrine 10 mg tid; CXR 8.3 showed CHF; wt 74.5 kg s/p net UF 3 L yesterday (EDW 73.5).  6. Anemia - Hgb up to 9 yesterday s/p transfusions 7/29 & 8/3, Aranesp 200 mcg yesterday.  7. Sec HPT - Ca 6.8 (8.2 corrected), P 3.4; Hectorol 1 mcg, Sensipar 30 mg qd, Renvela 4 with meals.  8. Hx RCC & bladder cancer - s/p nephrectomy 2001.    LOS: 5 days   Mialynn Shelvin 07/01/2013,6:47 AM

## 2013-07-01 NOTE — Consult Note (Signed)
CARDIOLOGY CONSULT NOTE  Patient ID: Andrew [name] MRN: [id number], DOB/AGE: [birth year]   Admit date: 06/26/2013 Date of Consult: 07/01/2013  Primary Physician: [name]., MD Primary Cardiologist: previously - R. Dietrich Pates, MD   Pt. Profile  79 y/o male with h/o CAD s/p CABG x 4 in 1998 and ESRD who was admitted on 7/31 with progressive multifactorial dyspnea, that we've been asked to eval.  Problem List  Past Medical History  Diagnosis Date  . ESRD on hemodialysis     Hemodialysis since 2003; Occluded access in both upper extremities and the right lower extremity, current access as of Aug 2014 is L thigh AVG. Gets HD MWF at [facility].     . Duodenal ulcer     remote  . Chronic combined systolic and diastolic CHF (congestive heart failure)     a. 02/2012 Echo: EF 65%, basal infolat AK, mild conc LVH;  b. 06/2013 Echo: EF 40-45%, mild LVH, Gr2 DD, basal inf HK->AK, Mod AS, Mild MR, PASP 73  . Arteriosclerotic cardiovascular disease (ASCVD) 1998    a. [id number]; b. 06/2003 Cath: LM 70d, LAD 70ost, 80p, LCX 70p/174m, RCA 80p/1105m, LIMA->LAD patent, VG->OM2->OM3 patent, VG->dRCA patent, EF 55%;  c. 02/2012 Lexi MV: inf/inflat scarring w/ ? minimal peri-infarct ischemia.  . Cerebrovascular disease   . Neuropathy of foot   . Degenerative joint disease   . Gastroesophageal reflux disease   . History of bladder cancer   . Impaired hearing     Bilateral; hearing aids relatively ineffective  . Anemia     Prior blood transfusion  . Hypertension   . Cholelithiasis 03/2011    Diagnosed incidentally on abdominal ultrasound in 03/2011  . Hepatic steatosis   . Macrocytosis 10/20/2012  . Thrombocytopenia 10/20/2012  . Gallstones   . Renal cell carcinoma 2001    s/p right nephrectomy-2001; subsequent ESRD  . Hyperlipidemia   . Secondary hyperparathyroidism   . Peripheral vascular disease   . Gout     Past Surgical History  Procedure Laterality Date  . Right nephrectomy   2001    Renal cell carcinoma  . Colonoscopy  01/24/2011    prominent vascular pattern, suboptimal prep but doable  . Esophagogastroduodenoscopy  01/24/2011    mild erosive reflux esophagitis, bulbar/antral erosions, bx from antrum benign  . Av fistula repair  2008    revision of anastomosis of Right AVF  . Arteriovenous graft placement  2006    Thrombectomy and interposition jump graft revision to higher axillary vein of LUA AVG  . Coronary artery bypass graft  1998    X4 VESSELS  . Multiple cysto/ resection tumor's with bx's  LAST ONE 05-09-2011  . Multiple surg's / interventions for avgg/ fistula right upper arm  LAST REVISION 06-29-2011    (JUNE 2010 STENT CEPHALIC VEIN/ 03-23-2011 DILATATION ANGIOPLASTY)  . Left ptc left renal artery    . Cardiac catheterization  2004  . Cataract extraction w/ intraocular lens  implant, bilateral    . Cystoscopy w/ retrogrades  12/28/2011    Procedure: CYSTOSCOPY WITH RETROGRADE PYELOGRAM;  Surgeon: [name], MD;  Location[facility];  Service: Urology;  Laterality: Left;  C-ARM   . Transurethral resection of bladder tumor  12/28/2011    Procedure: TRANSURETHRAL RESECTION OF BLADDER TUMOR (TURBT);  Surgeon: [name], MD;  Location: [facility];  Service: Urology;  Laterality: N/A;  . Av fistula placement  02/29/2012    Procedure: INSERTION  OF ARTERIOVENOUS (AV) GORE-TEX GRAFT THIGH;  Surgeon: [name], MD;  Location: [facility] OR;  Service: Vascular;  Laterality: Right;  Insertion right femoral arteriovenous gortex graft  . Ligation of right braciocephalic av fistula  04-04-2012  . Insertion of dialysis catheter  06/05/2012    Procedure: INSERTION OF DIALYSIS CATHETER;  Surgeon: Pryor Ochoa, MD;  Location: [facility] OR;  Service: Vascular;  Laterality: Left;  Insertion diatek catheter left IJ  . Insertion of dialysis catheter  08/22/2012    Procedure: INSERTION OF DIALYSIS CATHETER;  Surgeon: Nada Libman, MD;  Location:  [facility];  Service: Vascular;  Laterality: Left;  insertion of dialysis catheter left  internal jugular 27cm  . Av fistula placement  10/08/2012    Procedure: INSERTION OF ARTERIOVENOUS (AV) GORE-TEX GRAFT THIGH;  Surgeon: Chuck Hint, MD;  Location: [facility] OR;  Service: Vascular;  Laterality: Left;    Allergies  No Known Allergies  HPI   79 y/o male with the above complex problem list.  He is s/p CABG x 4 in 1998 and underwent repeat cath in 06/2003 revealing 4/4 patent grafts.  His last stress test was in April 2013 and was felt to be low-risk.  Medical management has been limited to asa and statin therapy secondary to chronic hypotension requiring midodrine therapy.  He further has chronic pancytopenia despite epogen therapy as an outpatient and has required frequent blood transfusions.  In late July, pt developed dyspnea, fever, wkns, and pleuritic chest pain.  He was seen in the ED on 7/20 and dx with CAP and placed on levaquin therapy.  Patient and family initially noted some improvement however over a course of about a week, he became more dyspneic and confused.  On 8/1, he was found to be more confused and febrile and he was taken to the ED for evaluation.  He was admitted for treatment of acute respiratory failure with concern for worsening pna.  He also had chf on cxr and renal was consulted for continued dialysis care.   Over the course of admission, he has had slow respiratory improvement.  His mental status has cleared.  Volume removal during HD has been somewhat limited by relative hypotension and his current weight is still roughly 1 KG above prior EDW.  He has been seen by hematology for pancytopenia and 2/2 concern for myelodysplastic d/o, a bone marrow biopsy was performed this AM.  On Sunday, 8/3, troponin's were sent off and an echocardiogram was ordered.  The patient does not recall having any acute symptoms and says that although he's noted some mild, diffuse, abdominal  discomfort, he has not had any chest pain.  Troponin's have been normal.  Echo was performed yesterday and showed an EF of 40-45% (previously  65% 02/2012), with mod AS, mild MR, and severe PAH with PASP of (previously 02/2012).  We have since been asked to eval.  Overall, patient feels that he is improving though he continues to note wheezing and some degree of DOE.  He has not had LEE, pnd, orthopnea, dizziness, syncope, or early satiety.  Inpatient Medications  . albuterol  2.5 mg Nebulization BID  . allopurinol  100 mg Oral QHS  . aspirin EC  81 mg Oral QHS  . atorvastatin  10 mg Oral q morning - 10a  . ceFEPime (MAXIPIME) IV  2 g Intravenous Q M,W,F-2000  . cinacalcet  30 mg Oral Q breakfast  . darbepoetin (ARANESP) injection - DIALYSIS  200 mcg Intravenous Q Mon-HD  . doxercalciferol  1 mcg Intravenous Q M,W,F-HD  . gabapentin  100 mg Oral BID  . ipratropium  0.5 mg Nebulization BID  . midodrine  10 mg Oral TID WC  . pantoprazole  40 mg Oral Daily  . polyethylene glycol  17 g Oral Daily  . sevelamer carbonate  3,200 mg Oral TID WC  . sodium chloride  3 mL Intravenous Q12H  . [START ON 07/02/2013] vancomycin  750 mg Intravenous Q M,W,F-HD   Family History Family History  Problem Relation Age of Onset  . Colon cancer Neg Hx   . Liver disease Neg Hx   . Anesthesia problems Neg Hx   . Heart disease Father   . Other Mother     died @ 80 with respiratory issues following hip fx  . Diabetes Mother     Social History History   Social History  . Marital Status: Widowed    Spouse Name: N/A    Number of Children: 3  . Years of Education: N/A   Occupational History  . Retired    Social History Main Topics  . Smoking status: Former Smoker -- 1.00 packs/day for 25 years    Types: Cigarettes    Quit date: 02/26/1984  . Smokeless tobacco: Former Neurosurgeon     Comment: quit 1985  . Alcohol Use: No  . Drug Use: No  . Sexually Active: Not Currently   Other Topics Concern   . Not on file   Social History Narrative   Lives in [place] by himself.  Family near by.    Review of Systems  General:  No chills, +++ fever, no night sweats +++ weight changes.  Cardiovascular:  +++ pleuritic chest pain in setting of pna, +++ dyspnea on exertion, no edema, orthopnea, palpitations, paroxysmal nocturnal dyspnea. Dermatological: No rash, lesions/masses Respiratory: No cough, +++ dyspnea Urologic: No hematuria, dysuria Abdominal:   Intermittent, diffuse, abd tenderness.  No nausea, vomiting, diarrhea, bright red blood per rectum, melena, or hematemesis Neurologic:  No visual changes, +++ wkns - now improving.  No changes in mental status. All other systems reviewed and are otherwise negative except as noted above.  Physical Exam  Blood pressure 114/69, pulse 86, temperature 98.6 F (37 C), temperature source Oral, resp. rate 18, height 5\' 9"  (1.753 m), weight 164 lb 4 oz (74.503 kg), SpO2 96.00%.  General: Pleasant, NAD Psych: Normal affect. Neuro: Alert and oriented X 3. Moves all extremities spontaneously. HEENT: Normal  Neck: Supple without bruits.  Mod elevated neck veins. Lungs:  Resp regular and unlabored, exp wheezing noted throughout bilat - post/ant. Heart: RRR no s3, s4, 3/6 sem rusb->apex Abdomen: Soft, non-tender, non-distended, BS + x 4.  Extremities: No clubbing, cyanosis or edema. DP/PT/Radials 2+ and equal bilaterally.  Labs  Recent Labs  06/29/13 1803 06/29/13 2330  TROPONINI <0.30 <0.30   Lab Results  Component Value Date   WBC 2.7* 07/01/2013   HGB 7.9* 07/01/2013   HCT 23.1* 07/01/2013   MCV 103.1* 07/01/2013   PLT 103* 07/01/2013    Recent Labs Lab 06/27/13 0540  06/30/13 1138  NA 135  < > 132*  K 5.1  < > 5.0  CL 96  < > 99  CO2 30  < > 20  BUN 60*  < > 76*  CREATININE 7.23*  < > 7.41*  CALCIUM 7.9*  < > 6.8*  PROT 5.5*  --   --   BILITOT 0.9  --   --  ALKPHOS 97  --   --   ALT 18  --   --   AST 31  --   --   GLUCOSE 100*   < > 105*  < > = values in this interval not displayed. Lab Results  Component Value Date   CHOL 103 03/11/2012   HDL 39* 03/11/2012   LDLCALC 41 03/11/2012   TRIG 117 03/11/2012   Lab Results  Component Value Date   DDIMER 4.70* 06/27/2013   Radiology/Studies  Dg Chest Port 1 View  06/29/2013   *RADIOLOGY REPORT*  Clinical Data: Fever.  Shortness of breath.  PORTABLE CHEST - 1 VIEW  Comparison: Two-view chest x-ray 06/26/2013, 06/19/2013, 04/25/2013.  Findings: Prior sternotomy for CABG.  Cardiac silhouette mildly enlarged but stable.  Mild pulmonary venous hypertension and new mild diffuse interstitial pulmonary edema as evidenced by scattered Kerley B lines.  No confluent airspace consolidation.  No visible pleural effusions.  IMPRESSION: Mild CHF and/or fluid overload, with new mild diffuse interstitial pulmonary edema.   Original Report Authenticated By: Hulan Saas, M.D.   ECG  Rsr, 85, pvc's, mild anterolateral st dep with twi. - no acute changes.  ASSESSMENT AND PLAN  1.  Acute respiratory failure:  Multifactorial in setting of recent pna, COPD flare, pancytopenia, and volume overload/CHF.  Slowly improving, though he continues to have expiratory wheezing.  Inhaler and abx therapy per IM.  Volume mgmt per renal.  He is s/p BM Bx with further w/u planned by Heme/Onc.  2.  Acute on chronic combined CHF:  As above, he has had volume overload with evidence of CHF on cxr.  Echo done yesterday shows new reduction in EF to 40-45%, down from 65% 16 months ago.  He is 1 KG above his dry weight and this is being managed by renal.  Due to h/o hypotension, he is not a candidate for bb/acei/arb therapy.  He denies any recent h/o chest pain and thus we prefer to hold off on ischemic testing at this time and instead recommend continued treatment for underlying respiratory illness with plan for outpatient cardiology f/u after recovery.  At that time we can reassess his symptoms and consider further  ischemic evaluation.  We discussed this with the patient and his daughter and they are in full agreement with this approach.  3.  CAD:  See above.  No chest pain.  Cont asa/statin.  4.  ESRD:  Per nephrology.  5.  Pancytopenia:  S/p BM Bx with plan for outpt heme/onc f/u.  6.  Pulmonary Hypertension:  PA pressures significantly more elevated on echo this year as opposed to last.  LV filling pressures also higher however.  PAH may certainly be playing a role in his overall condition.  7.  Mod AS:  Follow with annual echo.  Signed, Nicolasa Ducking, NP 07/01/2013, 12:43 PM  Patient seen, examined. Available data reviewed. Agree with findings, assessment, and plan as outlined by Ward Givens, NP. Exam reveals an alert, oriented, pleasant elderly male in NAD. Carotids 2+ with bilateral bruits, lungs with diffuse rhonchi, and CV regular with 3/6 harsh systolic murmur at the RUSB. There is no peripheral edema. I have personally reviewed the patient's echo images from 2013 and compared to his study this admission. LV function is now mildly depressed, was previously normal. Cardiac enzymes are negative. Volume/CHF management limited to fluid removal with hemodialysis. Unable to push cardiac meds further because of hypotension requiring midodrine. Will arrange outpatient follow-up in [place] for  consideration of further CV testing. I would not be inclined to pursue stress testing unless ischemic symptoms occur. Plan discussed with patient and daughter who agree with this approach.  Tonny Bollman, M.D. 07/01/2013 2:04 PM

## 2013-07-01 NOTE — Progress Notes (Signed)
I have personally seen and examined this patient and agree with the assessment/plan as outlined above by [name] PA. Plan for routine hemodialysis tomorrow without heparin to limit bleeding from bone marrow biopsy site. Is being done to evaluate pancytopenia. Appreciate input from hematology. Chastidy Ranker K.,MD 07/01/2013 9:20 AM

## 2013-07-01 NOTE — Progress Notes (Signed)
Bone Marrow Biopsy and Aspiration Procedure Note   Informed consent was obtained and potential risks including bleeding, infection and pain were reviewed with the patient.  Red Rule procedure followed  Posterior iliac crest(s) prepped with Betadine.  Lidocaine 2% local anesthesia infiltrated into the subcutaneous tissue.  Premedication: none  Left bone marrow biopsy X 2 was obtained. Inadequate aspirate.  The procedure was tolerated well and there were no complications.  Specimens sent for routine histopathologic stains and sectioning,  and cytogenetics  Indication: Pancytopenia R/O MDS  Physician: Levert Feinstein

## 2013-07-01 NOTE — Progress Notes (Signed)
[facility] PROGRESS NOTE  DAGON BUDAI NFA:[id number] DOB: 09/24/1926 DOA: 06/26/2013 PCP: [name]., MD  Brief narrative:  79 year old male with history of coronary artery disease status post CABG, chronic heart failure, end-stage renal disease on hemodialysis, hypertension, chronic anemia and chronic thrombocytopenia presented to the ER on 7/19 with a complaint of chest pain and shortness of breath. He was diagnosed with community-acquired pneumonia and placed on oral Levaquin which he apparently completed about 6 days prior to admit. On 7/31, patient was brought to the ER by his daughter with a complaint of shortness of breath, fevers, lethargy , intermittent confusion with findings of SIRS.   Assessment/Plan:  Principal Problem:  SIRS  Likely secondary to healthcare associated pneumonia  Patient currently on IV vancomycin and cefepime . Will complete 5 day course tomorrow with HD -  blood cultures negative to date   Active Problems:  End-stage renal disease on hemodialysis/renal cell cancer  -Renal following  Continue with scheduled dialysis   Pulmonary edema  - resolved after dialysis    CHF Noted for suppressed EF of 40% with grade 2 diastolic dysfunction  on 2 D echo. Seen by cardiology. Recommend to hold further cardiac meds given concern for low BP. Recommend to continue current meds with plan for outpatient eval for stress test.   Chronic pancytopenia  - Followed with Dr Mariel Sleet as outpatient. Suspected myelodysplastic syndrome however patient was declining bone marrow bx repeatedly as outpatient.  Seen by Dr. Cyndie Chime and patient had bone marrow biopsy today.  If Myelodysplasia seen on bone marrow he suggests patient might be a candidate for revlimid.  Received 2 Unit [facility] since admission   ho RCC/Bladder CA  S/p nephrectomy in 2001   Peripheral neuropathy  On neurontin   Gout  -on allopurinol   Hypotension  -takes Midodrine on dialysis days    CAD/ CABG  Continue aspirin and Lipitor   DVT prophylaxis:  SCDs   Code Status: full   Family Communication: daughter at bedside Disposition Plan: Home tomorrow after HD  Consultants:  Renal  Hematology  [facility] cardiology  Procedures:  none   Antibiotics:  Vanc/ and Cefepime 7/31>>   HPI/Subjective:  Patient seen and examined this morning. No overnight issues. Had BM bx this morning   Objective: Filed Vitals:   07/01/13 1400  BP: 115/57  Pulse: 82  Temp: 98.2 F (36.8 C)  Resp: 18    Intake/Output Summary (Last 24 hours) at 07/01/13 1511 Last data filed at 07/01/13 1300  Gross per 24 hour  Intake    600 ml  Output   3000 ml  Net  -2400 ml   Filed Weights   06/30/13 1130 06/30/13 1544 06/30/13 2111  Weight: 77.1 kg (169 lb 15.6 oz) 74 kg (163 lb 2.3 oz) 74.503 kg (164 lb 4 oz)    Exam:  General: Elderly male in no acute distress, hard of hearing  HEENT: No pallor, moist oral mucosa  Chest: Clear to auscultation bilaterally, no added sounds  CVS: Normal S1-S2, no murmurs rub or gallop  Abdomen: Soft, nontender, nondistended, bowel sounds present  Extremities: Warm, no edema  CNS: AAO x3   Data Reviewed: Basic Metabolic Panel:  Recent Labs Lab 06/26/13 2154 06/27/13 0540 06/29/13 0415 06/30/13 1138  NA 134* 135 132* 132*  K 5.2* 5.1 4.2 5.0  CL 90* 96 96 99  CO2 32 30 26 20   GLUCOSE 106* 100* 117* 105*  BUN 51* 60* 60* 76*  CREATININE  6.17* 7.23* 6.66* 7.41*  CALCIUM 8.6 7.9* 8.0* 6.8*  PHOS  --   --  3.1 3.4   Liver Function Tests:  Recent Labs Lab 06/26/13 2154 06/27/13 0540 06/29/13 0415 06/30/13 1138  AST 39* 31  --   --   ALT 20 18  --   --   ALKPHOS 136* 97  --   --   BILITOT 0.7 0.9  --   --   PROT 6.9 5.5*  --   --   ALBUMIN 3.5 2.8* 2.7* 2.2*   No results found for this basename: LIPASE, AMYLASE,  in the last 168 hours No results found for this basename: AMMONIA,  in the last 168 hours CBC:  Recent Labs Lab  06/26/13 2154  06/27/13 0540 06/28/13 0851 06/29/13 0415 06/30/13 0859 07/01/13 0500  WBC 1.7*  --  1.3* 1.5* 1.5* 3.0* 2.7*  NEUTROABS 0.9*  --  0.7*  --   --  1.8 1.5*  HGB 9.2*  --  7.0* 7.6* 6.7* 9.0* 7.9*  HCT 27.5*  --  21.5* 22.7* 20.1* 25.4* 23.1*  MCV 109.6*  --  109.1* 109.7* 106.3* 101.6* 103.1*  PLT 87*  < > 68* 67* 60* 86* 103*  < > = values in this interval not displayed. Cardiac Enzymes:  Recent Labs Lab 06/29/13 1803 06/29/13 2330  TROPONINI <0.30 <0.30   BNP (last 3 results)  Recent Labs  06/14/13 2309  PROBNP 25066.0*   CBG:  Recent Labs Lab 06/30/13 1114 06/30/13 1635 06/30/13 2114 07/01/13 0721 07/01/13 1201  GLUCAP 149* 109* 131* 85 115*    Recent Results (from the past 240 hour(s))  CULTURE, BLOOD (ROUTINE X 2)     Status: None   Collection Time    06/26/13  9:45 PM      Result Value Range Status   Specimen Description BLOOD RIGHT ARM   Final   Special Requests BOTTLES DRAWN AEROBIC AND ANAEROBIC 10CC   Final   Culture  Setup Time 06/27/2013 06:26   Final   Culture     Final   Value:        BLOOD CULTURE RECEIVED NO GROWTH TO DATE CULTURE WILL BE HELD FOR 5 DAYS BEFORE ISSUING A FINAL NEGATIVE REPORT   Report Status PENDING   Incomplete  CULTURE, BLOOD (ROUTINE X 2)     Status: None   Collection Time    06/26/13  9:56 PM      Result Value Range Status   Specimen Description BLOOD RIGHT HAND   Final   Special Requests BOTTLES DRAWN AEROBIC ONLY 5CC   Final   Culture  Setup Time 06/27/2013 06:26   Final   Culture     Final   Value:        BLOOD CULTURE RECEIVED NO GROWTH TO DATE CULTURE WILL BE HELD FOR 5 DAYS BEFORE ISSUING A FINAL NEGATIVE REPORT   Report Status PENDING   Incomplete  MRSA PCR SCREENING     Status: None   Collection Time    06/27/13  1:41 AM      Result Value Range Status   MRSA by PCR NEGATIVE  NEGATIVE Final   Comment:            The GeneXpert MRSA Assay (FDA     approved for NASAL specimens     only), is  one component of a     comprehensive MRSA colonization     surveillance program. It is not  intended to diagnose MRSA     infection nor to guide or     monitor treatment for     MRSA infections.     Studies: No results found.  Scheduled Meds: . albuterol  2.5 mg Nebulization BID  . allopurinol  100 mg Oral QHS  . aspirin EC  81 mg Oral QHS  . atorvastatin  10 mg Oral q morning - 10a  . ceFEPime (MAXIPIME) IV  2 g Intravenous Q M,W,F-2000  . cinacalcet  30 mg Oral Q breakfast  . darbepoetin (ARANESP) injection - DIALYSIS  200 mcg Intravenous Q Mon-HD  . doxercalciferol  1 mcg Intravenous Q M,W,F-HD  . gabapentin  100 mg Oral BID  . ipratropium  0.5 mg Nebulization BID  . midodrine  10 mg Oral TID WC  . pantoprazole  40 mg Oral Daily  . polyethylene glycol  17 g Oral Daily  . sevelamer carbonate  3,200 mg Oral TID WC  . sodium chloride  3 mL Intravenous Q12H  . [START ON 07/02/2013] vancomycin  750 mg Intravenous Q M,W,F-HD   Continuous Infusions:     Time spent: 25 minutes    Aubria Vanecek  [facility] Pager [phone] If 7PM-7AM, please contact night-coverage at [email or web address], password [facility] 07/01/2013, 3:11 PM  LOS: 5 days

## 2013-07-01 NOTE — Progress Notes (Signed)
Physical Therapy Treatment Patient Details Name: Andrew [name] MRN: [id number] DOB: Oct 12, 1926 Today's Date: 07/01/2013 Time: 4098-1191 PT Time Calculation (min): 15 min  PT Assessment / Plan / Recommendation  History of Present Illness This is an 79 year old male with a past medical history of coronary artery disease status post CABG, chronic heart failure, end-stage renal disease on hemodialysis, hypertension, chronic anemia and chronic thrombocytopenia.   PT Comments   Pt awaiting Bone Marrow Biopsy this am.    Follow Up Recommendations  Home health PT;Supervision - Intermittent     Does the patient have the potential to tolerate intense rehabilitation     Barriers to Discharge        Equipment Recommendations  None recommended by PT    Recommendations for Other Services    Frequency Min 3X/week   Progress towards PT Goals Progress towards PT goals: Progressing toward goals  Plan Current plan remains appropriate    Precautions / Restrictions Precautions Precautions: Fall   Pertinent Vitals/Pain Denies pain.  O2 sats 91% on RA after ambulating.    Mobility  Bed Mobility Bed Mobility: Supine to Sit;Sit to Supine;Sitting - Scoot to Edge of Bed Supine to Sit: 6: Modified independent (Device/Increase time);HOB elevated Sitting - Scoot to Edge of Bed: 6: Modified independent (Device/Increase time) Sit to Supine: 6: Modified independent (Device/Increase time) Transfers Transfers: Sit to Stand;Stand to Sit Sit to Stand: 5: Supervision;With upper extremity assist;From bed Stand to Sit: 5: Supervision;With upper extremity assist;To bed Ambulation/Gait Ambulation/Gait Assistance: 4: Min guard Ambulation Distance (Feet): 190 Feet Assistive device: Rolling walker Ambulation/Gait Assistance Details: cues to stay within RW, upright posture Gait Pattern: Step-through pattern;Decreased stride length;Trunk flexed Stairs: No Wheelchair Mobility Wheelchair Mobility: No     Exercises     PT Diagnosis:    PT Problem List:   PT Treatment Interventions:     PT Goals (current goals can now be found in the care plan section) Acute Rehab PT Goals Time For Goal Achievement: 07/06/13 Potential to Achieve Goals: Good  Visit Information  Last PT Received On: 07/01/13 Assistance Needed: +1 History of Present Illness: This is an 79 year old male with a past medical history of coronary artery disease status post CABG, chronic heart failure, end-stage renal disease on hemodialysis, hypertension, chronic anemia and chronic thrombocytopenia.    Subjective Data  Subjective: "Sure!"   Cognition  Cognition Arousal/Alertness: Awake/alert Behavior During Therapy: WFL for tasks assessed/performed Overall Cognitive Status: Within Functional Limits for tasks assessed    Balance  Balance Balance Assessed: Yes Static Standing Balance Static Standing - Balance Support: No upper extremity supported;During functional activity Static Standing - Level of Assistance: 5: Stand by assistance  End of Session PT - End of Session Equipment Utilized During Treatment: Gait belt Activity Tolerance: Patient limited by fatigue Patient left: in bed;with call bell/phone within reach;with bed alarm set Nurse Communication: Mobility status   GP     Andrew [name], [place] [phone] 07/01/2013, 8:32 AM

## 2013-07-01 NOTE — Progress Notes (Signed)
Bone marrow biopsy done.  2 biopsy samples obtained. I had difficulty getting an adequate aspirate. He can be discharged when  otherwise medically stable. When results are available, we'll contact the patient and arrange office followup. I discussed this with his daughter.

## 2013-07-02 ENCOUNTER — Other Ambulatory Visit: Payer: Self-pay

## 2013-07-02 DIAGNOSIS — R6510 Systemic inflammatory response syndrome (SIRS) of non-infectious origin without acute organ dysfunction: Secondary | ICD-10-CM | POA: Diagnosis present

## 2013-07-02 LAB — CBC
HCT: 21.6 % — ABNORMAL LOW (ref 39.0–52.0)
MCHC: 33.3 g/dL (ref 30.0–36.0)
MCV: 104.9 fL — ABNORMAL HIGH (ref 78.0–100.0)
RDW: 22 % — ABNORMAL HIGH (ref 11.5–15.5)

## 2013-07-02 LAB — RENAL FUNCTION PANEL
Albumin: 2.5 g/dL — ABNORMAL LOW (ref 3.5–5.2)
BUN: 60 mg/dL — ABNORMAL HIGH (ref 6–23)
Calcium: 7.5 mg/dL — ABNORMAL LOW (ref 8.4–10.5)
Creatinine, Ser: 6.72 mg/dL — ABNORMAL HIGH (ref 0.50–1.35)
Phosphorus: 2.9 mg/dL (ref 2.3–4.6)

## 2013-07-02 LAB — GLUCOSE, CAPILLARY: Glucose-Capillary: 85 mg/dL (ref 70–99)

## 2013-07-02 NOTE — Discharge Summary (Addendum)
Physician Discharge Summary  Andrew [name] JYN:[id number] DOB: [birth year] DOA: 06/26/2013  PCP: [name]., MD  Admit date: 06/26/2013 Discharge date: 07/02/2013  Time spent: 40 minutes  Recommendations for Outpatient Follow-up:  -Home with [facility] and PT -Follow up with PCP in 1 week. Needs cbc checked -Follow up with your scheduled HD -Follow up with Dr Cyndie Chime in 2 weeks for [facility] bx results and further plan of treatment -patient will be scheduled for follow up by [facility] in [place].   Discharge Diagnoses:  Principal Problem:   SIRS (systemic inflammatory response syndrome)  Active Problems:   Other pancytopenia   HCAP (healthcare-associated pneumonia)   ESRD (end stage renal disease)   ANEMIA   Hypertension   CAD (coronary artery disease)   CHF (congestive heart failure)   Discharge Condition: fair  Diet recommendation: cardiac  Filed Weights   06/30/13 2111 07/01/13 2157 07/02/13 0640  Weight: 74.503 kg (164 lb 4 oz) 80.151 kg (176 lb 11.2 oz) 77.3 kg (170 lb 6.7 oz)    History of present illness:  Please refer to admission H&P for details, but in brief, 79 year old male with history of coronary artery disease status post CABG, chronic heart failure, end-stage renal disease on hemodialysis, hypertension, chronic anemia and chronic thrombocytopenia presented to the ER on 7/19 with a complaint of chest pain and shortness of breath. He was diagnosed with community-acquired pneumonia and placed on oral Levaquin which he apparently completed about 6 days prior to admit. On 7/31, patient was brought to the ER by his daughter with a complaint of shortness of breath, fevers, lethargy , intermittent confusion with findings of SIRS.    Hospital Course:   SIRS  Likely secondary to healthcare associated pneumonia  Patient currently on IV vancomycin and cefepime . Will complete 7 day course today with HD  Remains afebrile. - blood cultures negative to date    Active Problems:  End-stage renal disease on hemodialysis/renal cell cancer  -Renal following  Continue with scheduled dialysis   Pulmonary edema  - resolved after dialysis   CHF  Noted for suppressed EF of 40% with grade 2 diastolic dysfunction on 2 D echo. Seen by cardiology. Recommend to hold further cardiac meds given concern for low BP and patient receiving midodrine for the same. Recommend to continue current meds with plan for outpatient eval for stress test.   Chronic pancytopenia  - Followed with Dr Mariel Sleet as outpatient. Suspected myelodysplastic syndrome however patient was declining bone marrow bx repeatedly as outpatient.  Seen by Dr. Cyndie Chime and patient had bone marrow biopsy on 8/6 . If Myelodysplasia seen on bone marrow he suggests patient might be a candidate for revlimid.  Received 3 Unit PRBC this  Admission. received 1 unit with HD today.  ho RCC/Bladder CA  S/p nephrectomy in 2001   Peripheral neuropathy  On neurontin   Gout  -on allopurinol   Hypotension  -takes Midodrine on dialysis days   CAD/ CABG  Continue aspirin and Lipitor   Code Status: full  Family Communication: discussed with daughter  Disposition Plan: Home  after HD   Consultants:  Renal  Hematology  [facility]   Procedures:  none   Antibiotics:  Vanc/ and Cefepime 7/31>> 8/7     Discharge Exam: Filed Vitals:   07/02/13 0930  BP: 112/49  Pulse: 79  Temp: 98 F (36.7 C)  Resp: 19    General: Elderly male in no acute distress, hard of hearing  HEENT: No  pallor, moist oral mucosa  Chest: Clear to auscultation bilaterally, no added sounds  CVS: Normal S1-S2, no murmurs rub or gallop  Abdomen: Soft, nontender, nondistended, bowel sounds present  Extremities: Warm, no edema  CNS: AAO x3   Discharge Instructions   Future Appointments Provider Department Dept Phone   07/04/2013 9:50 AM [facility] [phone]   07/10/2013 10:00  AM Claudia [facility] [phone]       Medication List         acetaminophen 500 MG tablet  Commonly known as:  TYLENOL  Take 500 mg by mouth every 6 (six) hours as needed. For pain/headache     albuterol 108 (90 BASE) MCG/ACT inhaler  Commonly known as:  PROVENTIL HFA;VENTOLIN HFA  Inhale 2 puffs into the lungs every 6 (six) hours as needed for wheezing or shortness of breath.     allopurinol 100 MG tablet  Commonly known as:  ZYLOPRIM  Take 100 mg by mouth at bedtime.     aspirin EC 81 MG tablet  Take 81 mg by mouth at bedtime.     atorvastatin 10 MG tablet  Commonly known as:  LIPITOR  Take 10 mg by mouth every morning.     calcium carbonate 500 MG chewable tablet  Commonly known as:  TUMS - dosed in mg elemental calcium  Chew 1 tablet by mouth daily as needed for heartburn. For heartburn     cinacalcet 30 MG tablet  Commonly known as:  SENSIPAR  Take 30 mg by mouth daily with breakfast.     COLCRYS 0.6 MG tablet  Generic drug:  colchicine  Take 0.6 mg by mouth 2 (two) times daily as needed. for gout     DIALYVITE TABLET Tabs  Take 1 tablet by mouth at bedtime.     gabapentin 100 MG capsule  Commonly known as:  NEURONTIN  Take 100 mg by mouth 2 (two) times daily.     midodrine 10 MG tablet  Commonly known as:  PROAMATINE  Take 10 mg by mouth See admin instructions. Take on Mon, Wed & Fri with dialysis. Then take 1 cap nightly prior to dialysis to increases blood pressure.     MIRALAX powder  Generic drug:  polyethylene glycol powder  Take 17 g by mouth at bedtime.     omeprazole 40 MG capsule  Commonly known as:  PRILOSEC  Take 40 mg by mouth at bedtime. For reflux     sevelamer carbonate 800 MG tablet  Commonly known as:  RENVELA  Take 1,600-3,200 mg by mouth 5 (five) times daily. 4 tabs 3 x daily and 2 with snack       No Known Allergies     Follow-up Information   Follow up with [name]., MD In 1 week.  (follow up CBC)    Contact information:   [address] [place] [address] [phone]       Follow up with Levert Feinstein, MD. Schedule an appointment as soon as possible for a visit in 2 weeks.   Contact information:   [address] [place] [address] [phone]        The results of significant diagnostics from this hospitalization (including imaging, microbiology, ancillary and laboratory) are listed below for reference.    Significant Diagnostic Studies: Dg Chest 2 View  06/26/2013   *RADIOLOGY REPORT*  Clinical Data: Fever and fatigue.  History of end-stage  renal disease and bladder carcinoma.  CHEST - 2 VIEW  Comparison: 06/19/2013  Findings: Chronic pulmonary venous hypertensive changes are present which appear slightly more prominent compared to the prior chest x- ray.  No overt airspace edema or pleural fluid is identified.  The heart size is stable size post prior CABG.  Mild degenerative changes are present in the thoracic spine.  IMPRESSION: Chronic pulmonary venous hypertensive changes which may be more slightly more prominent.  However, no overt airspace edema is identified.   Original Report Authenticated By: Irish Lack, M.D.   Dg Chest 2 View  06/19/2013   *RADIOLOGY REPORT*  Clinical Data: Shortness of breath.  Coughing.  Ex-smoker.  CHEST - 2 VIEW  Comparison: 03/24/2013.  06/14/2013.  Findings: Cardiac silhouette is borderline enlarged. The patient has undergone previous median sternotomy and coronary artery bypass grafting. Ectasia and nonaneurysmal calcification of the thoracic aorta are seen.  Mediastinal and hilar contours appear stable. Slight prominence of left hilum is felt to be vascular and unchanged.  No pulmonary infiltrative densities or masses are seen. There appears to be a right subclavian stent.  Surgical clips are seen in the left axillary region.  There is slight flattening of the diaphragm on lateral image which may  reflect minimal hyperinflation.  There is minimal central peribronchial thickening. There is minimal degenerative spondylosis.  IMPRESSION: Borderline cardiac silhouette enlargement.  Slight flattening of diaphragm on lateral image may reflect minimal hyperinflation. There is minimal central peribronchial thickening.  No consolidation or pleural effusion.   Original Report Authenticated By: Onalee Hua Call   Dg Chest Port 1 View  06/29/2013   *RADIOLOGY REPORT*  Clinical Data: Fever.  Shortness of breath.  PORTABLE CHEST - 1 VIEW  Comparison: Two-view chest x-ray 06/26/2013, 06/19/2013, 04/25/2013.  Findings: Prior sternotomy for CABG.  Cardiac silhouette mildly enlarged but stable.  Mild pulmonary venous hypertension and new mild diffuse interstitial pulmonary edema as evidenced by scattered Kerley B lines.  No confluent airspace consolidation.  No visible pleural effusions.  IMPRESSION: Mild CHF and/or fluid overload, with new mild diffuse interstitial pulmonary edema.   Original Report Authenticated By: Hulan Saas, M.D.   Dg Chest Port 1 View  06/14/2013   *RADIOLOGY REPORT*  Clinical Data: Short of breath.  Chest discomfort.  PORTABLE CHEST - 1 VIEW  Comparison: 04/25/2013  Findings: There is some mild patchy airspace and coarse reticular opacity in the right lung base.  This could reflect an infiltrate. It may reflect asymmetric edema.  The lungs otherwise clear.  No pleural effusion or pneumothorax.  The cardiac silhouette is normal in size and configuration. Changes from CABG surgery are stable.  No mediastinal or hilar masses.  IMPRESSION: Mild opacity at the right lung base suggest a small area of infiltrate or asymmetric edema.  It is new from the prior study.   Original Report Authenticated By: Amie Portland, M.D.    Microbiology: Recent Results (from the past 240 hour(s))  CULTURE, BLOOD (ROUTINE X 2)     Status: None   Collection Time    06/26/13  9:45 PM      Result Value Range Status    Specimen Description BLOOD RIGHT ARM   Final   Special Requests BOTTLES DRAWN AEROBIC AND ANAEROBIC 10CC   Final   Culture  Setup Time 06/27/2013 06:26   Final   Culture     Final   Value:        BLOOD CULTURE RECEIVED NO GROWTH TO DATE CULTURE  WILL BE HELD FOR 5 DAYS BEFORE ISSUING A FINAL NEGATIVE REPORT   Report Status PENDING   Incomplete  CULTURE, BLOOD (ROUTINE X 2)     Status: None   Collection Time    06/26/13  9:56 PM      Result Value Range Status   Specimen Description BLOOD RIGHT HAND   Final   Special Requests BOTTLES DRAWN AEROBIC ONLY 5CC   Final   Culture  Setup Time 06/27/2013 06:26   Final   Culture     Final   Value:        BLOOD CULTURE RECEIVED NO GROWTH TO DATE CULTURE WILL BE HELD FOR 5 DAYS BEFORE ISSUING A FINAL NEGATIVE REPORT   Report Status PENDING   Incomplete  MRSA PCR SCREENING     Status: None   Collection Time    06/27/13  1:41 AM      Result Value Range Status   MRSA by PCR NEGATIVE  NEGATIVE Final   Comment:            The GeneXpert MRSA Assay (FDA     approved for NASAL specimens     only), is one component of a     comprehensive MRSA colonization     surveillance program. It is not     intended to diagnose MRSA     infection nor to guide or     monitor treatment for     MRSA infections.     Labs: Basic Metabolic Panel:  Recent Labs Lab 06/26/13 2154 06/27/13 0540 06/29/13 0415 06/30/13 1138 07/02/13 0646  NA 134* 135 132* 132* 138  K 5.2* 5.1 4.2 5.0 5.0  CL 90* 96 96 99 103  CO2 32 30 26 20 24   GLUCOSE 106* 100* 117* 105* 88  BUN 51* 60* 60* 76* 60*  CREATININE 6.17* 7.23* 6.66* 7.41* 6.72*  CALCIUM 8.6 7.9* 8.0* 6.8* 7.5*  PHOS  --   --  3.1 3.4 2.9   Liver Function Tests:  Recent Labs Lab 06/26/13 2154 06/27/13 0540 06/29/13 0415 06/30/13 1138 07/02/13 0646  AST 39* 31  --   --   --   ALT 20 18  --   --   --   ALKPHOS 136* 97  --   --   --   BILITOT 0.7 0.9  --   --   --   PROT 6.9 5.5*  --   --   --   ALBUMIN  3.5 2.8* 2.7* 2.2* 2.5*   No results found for this basename: LIPASE, AMYLASE,  in the last 168 hours No results found for this basename: AMMONIA,  in the last 168 hours CBC:  Recent Labs Lab 06/26/13 2154  06/27/13 0540 06/28/13 0851 06/29/13 0415 06/30/13 0859 07/01/13 0500 07/02/13 0646  WBC 1.7*  --  1.3* 1.5* 1.5* 3.0* 2.7* 2.8*  NEUTROABS 0.9*  --  0.7*  --   --  1.8 1.5*  --   HGB 9.2*  --  7.0* 7.6* 6.7* 9.0* 7.9* 7.2*  HCT 27.5*  --  21.5* 22.7* 20.1* 25.4* 23.1* 21.6*  MCV 109.6*  --  109.1* 109.7* 106.3* 101.6* 103.1* 104.9*  PLT 87*  < > 68* 67* 60* 86* 103* 108*  < > = values in this interval not displayed. Cardiac Enzymes:  Recent Labs Lab 06/29/13 1803 06/29/13 2330  TROPONINI <0.30 <0.30   BNP: BNP (last 3 results)  Recent Labs  06/14/13 2309  PROBNP  25066.0*   CBG:  Recent Labs Lab 06/30/13 2114 07/01/13 0721 07/01/13 1201 07/01/13 1653 07/01/13 2152  GLUCAP 131* 85 115* 99 119*       Signed:  Rosha Cocker  [facility] 07/02/2013, 9:44 AM

## 2013-07-02 NOTE — Procedures (Signed)
Patient seen on Hemodialysis. QB 400, UF goal 4L. Getting 2 units PRBCs at HD today Treatment adjusted as needed.  Zetta Bills MD [facility]. Office # [phone] Pager # [phone] 8:51 AM

## 2013-07-02 NOTE — Progress Notes (Signed)
CM referral:  home health RN, PT                      DME: rolling walker with seat  [facility] called with home health referral [facility] called with DME referral   met with patient and daughter to advise of the home health and DME arrangements.

## 2013-07-02 NOTE — Progress Notes (Signed)
Subjective:  Seen on dialysis, no complaints, but appears very weak.  Objective: Vital signs in last 24 hours: Temp:  [97.7 F (36.5 C)-98.6 F (37 C)] 98.3 F (36.8 C) (08/06 0640) Pulse Rate:  [82-89] 89 (08/06 0700) Resp:  [18-21] 20 (08/06 0700) BP: (112-146)/(57-74) 133/66 mmHg (08/06 0700) SpO2:  [93 %-96 %] 93 % (08/06 0640) Weight:  [77.3 kg (170 lb 6.7 oz)-80.151 kg (176 lb 11.2 oz)] 77.3 kg (170 lb 6.7 oz) (08/06 0640) Weight change: 3.051 kg (6 lb 11.6 oz)  Intake/Output from previous day: 08/05 0701 - 08/06 0700 In: 486 [P.O.:480; I.V.:6] Out: -    Lab Results:  Recent Labs  07/01/13 0500 07/02/13 0646  WBC 2.7* 2.8*  HGB 7.9* 7.2*  HCT 23.1* 21.6*  PLT 103* 108*   BMET:  Recent Labs  06/30/13 1138 07/02/13 0646  NA 132* 138  K 5.0 5.0  CL 99 103  CO2 20 24  GLUCOSE 105* 88  BUN 76* 60*  CREATININE 7.41* 6.72*  CALCIUM 6.8* 7.5*  ALBUMIN 2.2* 2.5*   No results found for this basename: PTH,  in the last 72 hours Iron Studies:  Recent Labs  06/30/13 1200  IRON 186*  TIBC NOT CALC  FERRITIN 1863*   EXAM: General appearance:  Alert, in no apparent distress Resp:  CTA without rales, rhonchi, or wheezes Cardio:  RRR with Gr II/VI systolic murmur, no rub GI:  + BS, soft and nontender Extremities:  No edema Access:  L thigh AVG with BFR 400 cc/min  Outpatient HD ([place] MWF)  4hrs F160 Dry wt 73.5kg Bath 2K, 2.25Ca Prof 2 Heparin 3000 L thigh AVG  Hect 1ug EPO 20000 Venofer none   Assessment/Plan: 1. Dyspnea - improved; secondary to COPD (nebs), anemia (s/p transfusions), pulmonary edema (per CXR 8/3), ? PNA (recently on PO Levaquin) fever resolved, on Vancomycin & Cefepime.  2. Pancytopenia - chronic, Hx RCC & bladder cancer; evaluated by Hematology, likely myelodysplastic syndrome, L bone marrow biopsy per Dr. Cyndie Chime yesterday.  3. AMS - resolved.  4. ESRD - HD on MWF @ [facility]; K 5. HD today.  5. Hypotension/ Volume - BP 118/58 on  Midodrine 10 mg tid; CXR 8/3 showed CHF; wt 77.3 kg pre-HD (EDW 73.5).  Increase UF goal to 4 L. 6. Anemia - Hgb up to 9 yesterday s/p transfusions 7/29 & 8/3, now down to 7.2; s/p Aranesp 200 mcg on Mon.  Transfuse 2 U today. 7. Sec HPT - Ca 7.5 (8.7 corrected), P 2.9; Hectorol 1 mcg, Sensipar 30 mg qd, Renvela 4 with meals.  8. Hx RCC & bladder cancer - s/p nephrectomy 2001.      LOS: 6 days   Andrew [name] 07/02/2013,8:09 AM

## 2013-07-02 NOTE — Progress Notes (Signed)
I have personally seen and examined this patient and agree with the assessment/plan as outlined above by [name] PA. Gradually improving dyspnea and AMS. S/p BM biopsy yesterday for pancytopenia evaluation Kashana Breach K.,MD 07/02/2013 8:50 AM

## 2013-07-02 NOTE — Progress Notes (Signed)
Pt. Got discharge paper and follow up appointment.IV was d/C,Tele was d/C.pt. Ready to go home.

## 2013-07-03 LAB — CULTURE, BLOOD (ROUTINE X 2)
Culture: NO GROWTH
Culture: NO GROWTH

## 2013-07-03 LAB — TYPE AND SCREEN
ABO/RH(D): O POS
Antibody Screen: NEGATIVE
Unit division: 0

## 2013-07-04 ENCOUNTER — Encounter (HOSPITAL_COMMUNITY): Payer: Medicare Other | Attending: Oncology

## 2013-07-04 DIAGNOSIS — D61818 Other pancytopenia: Secondary | ICD-10-CM | POA: Insufficient documentation

## 2013-07-04 LAB — CBC WITH DIFFERENTIAL/PLATELET
Basophils Relative: 0 % (ref 0–1)
Hemoglobin: 10.1 g/dL — ABNORMAL LOW (ref 13.0–17.0)
MCHC: 32.8 g/dL (ref 30.0–36.0)
Monocytes Relative: 7 % (ref 3–12)
Neutro Abs: 1.5 10*3/uL — ABNORMAL LOW (ref 1.7–7.7)
Neutrophils Relative %: 43 % (ref 43–77)
Platelets: 165 10*3/uL (ref 150–400)
RBC: 3.12 MIL/uL — ABNORMAL LOW (ref 4.22–5.81)

## 2013-07-04 NOTE — Progress Notes (Signed)
Labs drawn today for cbc/diff 

## 2013-07-10 ENCOUNTER — Encounter (HOSPITAL_COMMUNITY): Payer: Self-pay | Admitting: Oncology

## 2013-07-10 ENCOUNTER — Encounter (HOSPITAL_BASED_OUTPATIENT_CLINIC_OR_DEPARTMENT_OTHER): Payer: Medicare Other | Admitting: Oncology

## 2013-07-10 VITALS — BP 77/38 | HR 72 | Temp 96.9°F | Resp 15 | Wt 165.9 lb

## 2013-07-10 DIAGNOSIS — D46C Myelodysplastic syndrome with isolated del(5q) chromosomal abnormality: Secondary | ICD-10-CM

## 2013-07-10 HISTORY — DX: Myelodysplastic syndrome with isolated del(5q) chromosomal abnormality: D46.C

## 2013-07-10 NOTE — Progress Notes (Signed)
[name]., MD [address] [place] [address]  MDS (myelodysplastic syndrome) with 5q deletion  CURRENT THERAPY: To be addressed with Dr. Cyndie Chime in near future.  INTERVAL HISTORY: Andrew [name] 79 y.o. male returns for  regular  visit for followup of MDS with 5q- Syndrome.   The patient was admitted to the [facility] from 06/26/2013- 07/02/2013 with SIRS.  While in the hospital, Dr. Cyndie Chime was consulted for pancytopenia.  He performed a bone marrow aspiration and biopsy.  The pathology from that follows below, but it appears as though the patient likely has MDS with 5q - Syndrome.   I personally reviewed and went over pathology results with the patient.  We reviewed the pathology from his bone marrow test. I briefly discussed treatment options including Revlimid.  I will defer development of treatment plan and discussion of diagnosis in detail to Dr. Cyndie Chime.    I answered all of the patient questions in addition to his daughter's questions.  They were satisfied with my answers.  Since he is planning on seeing Dr. Cyndie Chime in the very near future, we will not reschedule him an appointment at the [facility], but are available for local support.  The patient mentioned that he may want to transfer his care to [facility], but he wishes to see Dr. Cyndie Chime as scheduled.  Either way, we are available to the patient.   This AM, he denies any complaints and hematologic ROS questioning is negative.   Past Medical History  Diagnosis Date  . ESRD on hemodialysis     Hemodialysis since 2003; Occluded access in both upper extremities and the right lower extremity, current access as of Aug 2014 is L thigh AVG. Gets HD MWF at [facility].     . Duodenal ulcer     remote  . Chronic combined systolic and diastolic CHF (congestive heart failure)     a. 02/2012 Echo: EF 65%, basal infolat AK, mild conc LVH;  b.  06/2013 Echo: EF 40-45%, mild LVH, Gr2 DD, basal inf HK->AK, Mod AS, Mild MR, PASP 73  . Arteriosclerotic cardiovascular disease (ASCVD) 1998    a. [id number]; b. 06/2003 Cath: LM 70d, LAD 70ost, 80p, LCX 70p/146m, RCA 80p/160m, LIMA->LAD patent, VG->OM2->OM3 patent, VG->dRCA patent, EF 55%;  c. 02/2012 Lexi MV: inf/inflat scarring w/ ? minimal peri-infarct ischemia.  . Cerebrovascular disease   . Neuropathy of foot   . Degenerative joint disease   . Gastroesophageal reflux disease   . History of bladder cancer   . Impaired hearing     Bilateral; hearing aids relatively ineffective  . Anemia     Prior blood transfusion  . Hypertension   . Cholelithiasis 03/2011    Diagnosed incidentally on abdominal ultrasound in 03/2011  . Hepatic steatosis   . Macrocytosis 10/20/2012  . Thrombocytopenia 10/20/2012  . Gallstones   . Renal cell carcinoma 2001    s/p right nephrectomy-2001; subsequent ESRD  . Hyperlipidemia   . Secondary hyperparathyroidism   . Peripheral vascular disease   . Gout   . Moderate aortic stenosis     a. 06/2013 Echo: Mod AS, mean grad: 25, peak grad: 57.  . Pulmonary hypertension     a. 06/2013 Echo: PASP .  . MDS (myelodysplastic syndrome) with 5q deletion 07/10/2013    has SECONDARY HYPERPARATHYROIDISM; ANEMIA; Hypertension; ESRD (end stage renal disease); Renal cell carcinoma; Peripheral neuropathy; Peripheral vascular disease; Cerebrovascular disease; CAD (  coronary artery disease); Other pancytopenia; Sepsis; HCAP (healthcare-associated pneumonia); Acute systolic heart failure; CHF (congestive heart failure); SIRS (systemic inflammatory response syndrome); and MDS (myelodysplastic syndrome) with 5q deletion on his problem list.     has No Known Allergies.  Andrew [name] does not currently have medications on file.  Past Surgical History  Procedure Laterality Date  . Right nephrectomy  2001    Renal cell carcinoma  . Colonoscopy  01/24/2011    prominent vascular  pattern, suboptimal prep but doable  . Esophagogastroduodenoscopy  01/24/2011    mild erosive reflux esophagitis, bulbar/antral erosions, bx from antrum benign  . Av fistula repair  2008    revision of anastomosis of Right AVF  . Arteriovenous graft placement  2006    Thrombectomy and interposition jump graft revision to higher axillary vein of LUA AVG  . Coronary artery bypass graft  1998    X4 VESSELS  . Multiple cysto/ resection tumor's with bx's  LAST ONE 05-09-2011  . Multiple surg's / interventions for avgg/ fistula right upper arm  LAST REVISION 06-29-2011    (JUNE 2010 STENT CEPHALIC VEIN/ 03-23-2011 DILATATION ANGIOPLASTY)  . Left ptc left renal artery    . Cardiac catheterization  2004  . Cataract extraction w/ intraocular lens  implant, bilateral    . Cystoscopy w/ retrogrades  12/28/2011    Procedure: CYSTOSCOPY WITH RETROGRADE PYELOGRAM;  Surgeon: [name], MD;  Location: [facility];  Service: Urology;  Laterality: Left;  C-ARM   . Transurethral resection of bladder tumor  12/28/2011    Procedure: TRANSURETHRAL RESECTION OF BLADDER TUMOR (TURBT);  Surgeon: [name], MD;  Location: [facility];  Service: Urology;  Laterality: N/A;  . Av fistula placement  02/29/2012    Procedure: INSERTION OF ARTERIOVENOUS (AV) GORE-TEX GRAFT THIGH;  Surgeon: [name], MD;  Location: [facility] OR;  Service: Vascular;  Laterality: Right;  Insertion right femoral arteriovenous gortex graft  . Ligation of right braciocephalic av fistula  04-04-2012  . Insertion of dialysis catheter  06/05/2012    Procedure: INSERTION OF DIALYSIS CATHETER;  Surgeon: Pryor Ochoa, MD;  Location: [facility] OR;  Service: Vascular;  Laterality: Left;  Insertion diatek catheter left IJ  . Insertion of dialysis catheter  08/22/2012    Procedure: INSERTION OF DIALYSIS CATHETER;  Surgeon: Nada Libman, MD;  Location: [facility];  Service: Vascular;  Laterality: Left;  insertion of dialysis  catheter left  internal jugular 27cm  . Av fistula placement  10/08/2012    Procedure: INSERTION OF ARTERIOVENOUS (AV) GORE-TEX GRAFT THIGH;  Surgeon: Chuck Hint, MD;  Location: [facility] OR;  Service: Vascular;  Laterality: Left;    Denies any headaches, dizziness, double vision, fevers, chills, night sweats, nausea, vomiting, diarrhea, constipation, chest pain, heart palpitations, shortness of breath, blood in stool, black tarry stool, urinary pain, urinary burning, urinary frequency, hematuria.   PHYSICAL EXAMINATION  ECOG PERFORMANCE STATUS: 2 - Symptomatic, <50% confined to bed  Filed Vitals:   07/10/13 1000  BP: 77/38  Pulse: 72  Temp: 96.9 F (36.1 C)  Resp: 15    GENERAL:alert, no distress, well nourished, well developed, comfortable, cooperative and smiling SKIN: skin color, texture, turgor are normal, no rashes or significant lesions HEAD: Normocephalic, No masses, lesions, tenderness or abnormalities EYES: normal, PERRLA, EOMI, Conjunctiva are pink and non-injected EARS: External ears normal OROPHARYNX:mucous membranes are moist  NECK: supple, trachea midline LYMPH:  not examined BREAST:not examined LUNGS:  not examined HEART: not examined ABDOMEN:not examined BACK: not examined EXTREMITIES:no skin discoloration, no cyanosis  NEURO: alert & oriented x 3 with fluent speech, no focal motor/sensory deficits    PATHOLOGY:  07/01/2013  Diagnosis Bone Marrow, Aspirate and Biopsy, left - NORMOCELLULAR MARROW WITH MILD DYSMEGAKARYOPOIESIS. - ERYTHROID HYPOPLASIA. - SEE COMMENT. PERIPHERAL BLOOD: - PANCYTOPENIA. Diagnosis Note In summary, the bone marrow is normocellular, but exhibits a marked erythroid hypoplasia and dysplastic megakaryocytes. These findings can be seen in myelodysplastic syndrome, particularly in myelodysplastic syndrome with isolated del(5q). Non-clonal causes of dysplasia must be excluded before the diagnosis of myelodysplasia is  established. These include, but are not limited to, drug and toxin exposure, growth factor therapy, viral infections, immunologic disorders, and nutritional deficiencies (e.g., B12, copper, etc). Correlation with cytogenetic studies is recommended. Valinda Hoar MD Pathologist, Electronic Signature (Case signed 07/03/2013)     ASSESSMENT:  1. MDS, with 5q- Syndrome.   2. Pancytopenia  Patient Active Problem List   Diagnosis Date Noted  . 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 SIRS (systemic inflammatory response syndrome) 07/02/2013  . Acute systolic heart failure 07/01/2013  . CHF (congestive heart failure) 07/01/2013  . Sepsis 06/27/2013  . HCAP (healthcare-associated pneumonia) 06/27/2013  . Other pancytopenia 10/20/2012  . CAD (coronary artery disease) 08/20/2012  . Peripheral vascular disease 08/06/2012  . Cerebrovascular disease 08/06/2012  . Peripheral neuropathy 01/09/2012  . Hypertension   . ESRD (end stage renal disease)   . Renal cell carcinoma   . ANEMIA 01/10/2011  . SECONDARY HYPERPARATHYROIDISM 05/26/2009     PLAN:  1. I personally reviewed and went over laboratory results with the patient. 2. I personally reviewed and went over radiographic studies with the patient. 3. I personally reviewed and went over pathology results with the patient. 4. Patient wishes to see Dr. Cyndie Chime as scheduled.  Will therefore defer development of treatment plan to him.  Would be glad to be the patient's local support if necessary. 5. Patient education regarding diagnosis and possible treatment options.  6. Will not schedule the patient for a return appointment at this time as patient is considering options regarding treatment facilities ([facility] versus [facility])    THERAPY PLAN:  The patient wishes to follow-up with Dr. Cyndie Chime in [place] at this time.  I placed a call to him this AM, but today is his day off.  The patient will see him as scheduled and  I have deferred discussion regarding treatment options to Dr. Cyndie Chime.  I suspect his is a good candidate for Revlimid therapy.  All questions were answered. The patient knows to call the clinic with any problems, questions or concerns. We can certainly see the patient much sooner if necessary.  Patient and plan discussed with Dr. Benita Gutter and he is in agreement with the aforementioned.   Deonte Otting

## 2013-07-10 NOTE — Patient Instructions (Addendum)
[facility] Discharge Instructions  RECOMMENDATIONS MADE BY THE CONSULTANT AND ANY TEST RESULTS WILL BE SENT TO YOUR REFERRING PHYSICIAN.  EXAM FINDINGS BY THE PHYSICIAN TODAY AND SIGNS OR SYMPTOMS TO REPORT TO CLINIC OR PRIMARY PHYSICIAN: Discussion by Dellis Anes, PA.  MEDICATIONS PRESCRIBED:  none     SPECIAL INSTRUCTIONS/FOLLOW-UP: No follow-up here. To be seen by Dr. Cyndie Chime at [facility].  Thank you for choosing [facility] to provide your oncology and hematology care.  To afford each patient quality time with our providers, please arrive at least 15 minutes before your scheduled appointment time.  With your help, our goal is to use those 15 minutes to complete the necessary work-up to ensure our physicians have the information they need to help with your evaluation and healthcare recommendations.    Effective January 1st, 2014, we ask that you re-schedule your appointment with our physicians should you arrive 10 or more minutes late for your appointment.  We strive to give you quality time with our providers, and arriving late affects you and other patients whose appointments are after yours.    Again, thank you for choosing [facility].  Our hope is that these requests will decrease the amount of time that you wait before being seen by our physicians.       _____________________________________________________________  Should you have questions after your visit to [facility], please contact our office at [phone] between the hours of 8:30 a.m. and 5:00 p.m.  Voicemails left after 4:30 p.m. will not be returned until the following business day.  For prescription refill requests, have your pharmacy contact our office with your prescription refill request.

## 2013-07-11 ENCOUNTER — Ambulatory Visit (INDEPENDENT_AMBULATORY_CARE_PROVIDER_SITE_OTHER): Payer: Medicare Other | Admitting: Urology

## 2013-07-11 DIAGNOSIS — C670 Malignant neoplasm of trigone of bladder: Secondary | ICD-10-CM

## 2013-07-14 ENCOUNTER — Telehealth: Payer: Self-pay | Admitting: Oncology

## 2013-07-14 NOTE — Telephone Encounter (Signed)
C/D 07/14/13 for appt. 07/15/13

## 2013-07-15 ENCOUNTER — Telehealth: Payer: Self-pay | Admitting: Oncology

## 2013-07-15 ENCOUNTER — Ambulatory Visit: Payer: Medicare Other

## 2013-07-15 ENCOUNTER — Encounter: Payer: Self-pay | Admitting: Oncology

## 2013-07-15 ENCOUNTER — Ambulatory Visit (HOSPITAL_BASED_OUTPATIENT_CLINIC_OR_DEPARTMENT_OTHER): Payer: Medicare Other | Admitting: Oncology

## 2013-07-15 VITALS — BP 111/48 | HR 81 | Temp 98.1°F | Resp 20 | Ht 68.0 in | Wt 167.4 lb

## 2013-07-15 DIAGNOSIS — D46C Myelodysplastic syndrome with isolated del(5q) chromosomal abnormality: Secondary | ICD-10-CM

## 2013-07-15 DIAGNOSIS — N186 End stage renal disease: Secondary | ICD-10-CM

## 2013-07-15 NOTE — Progress Notes (Signed)
Checked in new pt with no financial concerns. °

## 2013-07-15 NOTE — Telephone Encounter (Signed)
gv and printed appt sched and avs for pt  °

## 2013-07-15 NOTE — Telephone Encounter (Signed)
C/D 07/15/13 for appt. 07/15/13

## 2013-07-16 NOTE — Progress Notes (Signed)
Hematology and Oncology Follow Up Visit  Andrew [name] [id number] 10/25/1926 79 y.o. 07/16/2013 7:05 PM   Principle Diagnosis: Encounter Diagnosis  Name Primary?  . MDS (myelodysplastic syndrome) with 5q deletion Yes     Interim History:   First post hospital visit for this 79 year old man I recently saw in consultation at [facility]. Please see my consultation note dated 06/26/2013 for complete details of his complicated medical history. He has been under evaluation for pancytopenia for the last year. He was seen in consultation by one of my partners in [place] and subsequently followed closer to his home in our [place]  office. He has significant macrocytic red cell indices. Hemoglobin was recorded as low as 7.3 g. White blood counts in the 2400-4700 range, and platelet counts in the 60-100,000 range. Differential white count only sample done 06/26/2013 with total white count 1700 showed 48% neutrophils, 37% lymphocytes, 12 monocytes. Myeloma screen was negative in the past. He was told that he likely had a myelodysplastic syndrome. He is also likely getting weekly Epogen injections since he is on dialysis for end-stage renal disease. He was advised to have a bone marrow aspiration and biopsy but declined until now. He is here today with his family to discuss results of the bone marrow biopsy that I did on 07/01/2013. I fully expected to see a 5Q minus deletion. However there were no deletions of chromosomes 5 or 7. This being said, morphologic findings on the bone marrow were very similar to those findings that we see in association with a 5Q minus syndrome. Marrow was normocellular for age. There was dysmegakaryopoiesis, and marked erythroid hypoplasia. No excess blasts. Cell counts done on touch preps since I was unable to obtain a good aspirate.  Medications: reviewed  Allergies: No Known Allergies  Review of Systems: He was treated for heart failure during recent  admission for recurrent infection. He feels much better. Breathing is better. Is not having a chest pain. No palpitations. Remaining ROS negative.  Physical Exam: Blood pressure 111/48, pulse 81, temperature 98.1 F (36.7 C), temperature source Oral, resp. rate 20, height 5\' 8"  (1.727 m), weight 167 lb 6.4 oz (75.932 kg). Wt Readings from Last 3 Encounters:  07/15/13 167 lb 6.4 oz (75.932 kg)  07/10/13 165 lb 14.4 oz (75.252 kg)  07/02/13 160 lb 15 oz (73 kg)     General appearance: Well-nourished Caucasian man, hard of hearing HENNT: Pharynx no erythema or exudate, bilateral hearing aids Lymph nodes: No lymphadenopathy Breasts: Lungs: Overall clear to auscultation and resonant to percussion Heart: Regular rhythm, 2/6 systolic murmur at the left sternal border, Abdomen: Soft, nontender, no mass, no organomegaly Extremities: No edema, no calf tenderness Musculoskeletal: No joint deformities GU: Vascular: No cyanosis, no carotid bruits Neurologic: hard of hearing.  pupils pinpoint. Ptosis of the right eyelid. Motor strength 5 over 5. Reflexes absent symmetric. Mild decreased vibration sensation over the fingertips. Upper body coordination normal. Gait is unsteady. Skin: scattered small ecchymoses  Lab Results: Lab Results  Component Value Date   WBC 3.4* 07/04/2013   HGB 10.1* 07/04/2013   HCT 30.8* 07/04/2013   MCV 98.7 07/04/2013   PLT 165 07/04/2013     Chemistry      Component Value Date/Time   NA 138 07/02/2013 0646   K 5.0 07/02/2013 0646   K 6.0 03/23/2011 0912   CL 103 07/02/2013 0646   CO2 24 07/02/2013 0646   BUN 60* 07/02/2013 0646   CREATININE 6.72*  07/02/2013 0646      Component Value Date/Time   CALCIUM 7.5* 07/02/2013 0646   CALCIUM 7.8* 03/13/2012 0527   ALKPHOS 97 06/27/2013 0540   ALKPHOS 103 03/23/2011 0912   AST 31 06/27/2013 0540   ALT 18 06/27/2013 0540   BILITOT 0.9 06/27/2013 0540    White count differential: 43% neutrophils, 43% lymphocytes, 7 monocytes, ANC  1.5.    Impression: Low risk myelodysplastic syndrome in the revised international prognostic scoring system.  good cytogenetics, 1.5. Points;  hemoglobin less than 8, 1.5 points;  0.5 points for  platelet count between 50-100,000 for a total of 3 points.    recommendation: We had a lengthy discussion about the risks versus benefits of a trial of Revlimid, an immunomodulatory agent, which has an approximate 30% response rate in myelodysplastic syndrome and up to 50% response rate in the 5Q minus syndrome. This drug is cleared by the kidney. He is on dialysis. However I have a number of multiple myeloma patients on dialysis who also take Revlimid in low dose following each dialysis session and they are tolerating the drug well. He is agreeable to a trial of this drug. We reviewed all potential side effects and he signed a consent document. . I will start with 5 mg 3 times weekly after dialysis. It will likely take 2 months to see her response. I would continue his Aranesp injections.  Other than dialysis, he stated to me, in front of his family, that he would not want to have his life prolonged by mechanical means.   CC:.  Dr. Casimiro Needle; Dr. Neva Seat; Satira Sark, [place]    Levert Feinstein, MD 8/20/20147:05 PM

## 2013-07-17 ENCOUNTER — Other Ambulatory Visit: Payer: Self-pay | Admitting: *Deleted

## 2013-07-18 MED ORDER — LENALIDOMIDE 5 MG PO CAPS: ORAL_CAPSULE | ORAL | Status: DC

## 2013-07-21 ENCOUNTER — Other Ambulatory Visit: Payer: Self-pay | Admitting: Urology

## 2013-07-24 ENCOUNTER — Other Ambulatory Visit (HOSPITAL_BASED_OUTPATIENT_CLINIC_OR_DEPARTMENT_OTHER): Payer: Medicare Other

## 2013-07-24 ENCOUNTER — Encounter: Payer: Self-pay | Admitting: Oncology

## 2013-07-24 DIAGNOSIS — D46C Myelodysplastic syndrome with isolated del(5q) chromosomal abnormality: Secondary | ICD-10-CM

## 2013-07-24 DIAGNOSIS — D61818 Other pancytopenia: Secondary | ICD-10-CM

## 2013-07-24 LAB — CBC WITH DIFFERENTIAL/PLATELET
Basophils Absolute: 0 10*3/uL (ref 0.0–0.1)
Eosinophils Absolute: 0.2 10*3/uL (ref 0.0–0.5)
HGB: 8.2 g/dL — ABNORMAL LOW (ref 13.0–17.1)
MCV: 104.9 fL — ABNORMAL HIGH (ref 79.3–98.0)
MONO#: 0.2 10*3/uL (ref 0.1–0.9)
NEUT#: 1.2 10*3/uL — ABNORMAL LOW (ref 1.5–6.5)
RBC: 2.47 10*6/uL — ABNORMAL LOW (ref 4.20–5.82)
RDW: 26.9 % — ABNORMAL HIGH (ref 11.0–14.6)
WBC: 2.9 10*3/uL — ABNORMAL LOW (ref 4.0–10.3)
lymph#: 1.3 10*3/uL (ref 0.9–3.3)
nRBC: 0 % (ref 0–0)

## 2013-07-24 NOTE — Progress Notes (Signed)
07/17/13-Faxed revlimid prescription to [facility] @ [phone].

## 2013-07-24 NOTE — Progress Notes (Signed)
[employer] [id number] approved revlimid from 07/18/13-07/22/14.

## 2013-07-29 ENCOUNTER — Other Ambulatory Visit (HOSPITAL_COMMUNITY): Payer: Self-pay | Admitting: Oncology

## 2013-07-29 ENCOUNTER — Telehealth (HOSPITAL_COMMUNITY): Payer: Self-pay | Admitting: Oncology

## 2013-07-29 ENCOUNTER — Encounter (HOSPITAL_COMMUNITY): Payer: Medicare Other | Attending: Oncology

## 2013-07-29 DIAGNOSIS — D46C Myelodysplastic syndrome with isolated del(5q) chromosomal abnormality: Secondary | ICD-10-CM

## 2013-07-29 LAB — CBC
HCT: 23.4 % — ABNORMAL LOW (ref 39.0–52.0)
Hemoglobin: 7.6 g/dL — ABNORMAL LOW (ref 13.0–17.0)
MCV: 107.8 fL — ABNORMAL HIGH (ref 78.0–100.0)
RBC: 2.17 MIL/uL — ABNORMAL LOW (ref 4.22–5.81)
WBC: 2.6 10*3/uL — ABNORMAL LOW (ref 4.0–10.5)

## 2013-07-29 NOTE — Progress Notes (Unsigned)
Andrew [name] presented for [employer]. Labs per MD order drawn via Peripheral Line 23 gauge needle inserted in right forearm on second attempt.  Good blood return present. Procedure without incident.  Needle removed intact. Patient tolerated procedure well.

## 2013-07-29 NOTE — Telephone Encounter (Signed)
Andrew [name] contacted and advised that he needs to come in for T&S - states he will be here after 11am, when his physical therapist leaves.  Lab orders placed for T&S and CBC.

## 2013-07-30 ENCOUNTER — Encounter (HOSPITAL_COMMUNITY): Payer: Medicare Other

## 2013-07-31 ENCOUNTER — Encounter (HOSPITAL_BASED_OUTPATIENT_CLINIC_OR_DEPARTMENT_OTHER): Payer: Medicare Other

## 2013-07-31 ENCOUNTER — Telehealth: Payer: Self-pay | Admitting: *Deleted

## 2013-07-31 VITALS — BP 116/54 | HR 71 | Temp 97.2°F | Resp 16

## 2013-07-31 DIAGNOSIS — D46C Myelodysplastic syndrome with isolated del(5q) chromosomal abnormality: Secondary | ICD-10-CM

## 2013-07-31 MED ORDER — SODIUM CHLORIDE 0.9 % IV SOLN
250.0000 mL | Freq: Once | INTRAVENOUS | Status: AC
  Administered 2013-07-31: 250 mL via INTRAVENOUS

## 2013-07-31 MED ORDER — FUROSEMIDE 10 MG/ML IJ SOLN
INTRAMUSCULAR | Status: AC
  Filled 2013-07-31: qty 2

## 2013-07-31 MED ORDER — FUROSEMIDE 10 MG/ML IJ SOLN
20.0000 mg | Freq: Once | INTRAMUSCULAR | Status: DC
Start: 2013-07-31 — End: 2013-07-31

## 2013-07-31 MED ORDER — DIPHENHYDRAMINE HCL 25 MG PO CAPS
ORAL_CAPSULE | ORAL | Status: AC
  Filled 2013-07-31: qty 1

## 2013-07-31 MED ORDER — SODIUM CHLORIDE 0.9 % IJ SOLN
10.0000 mL | INTRAMUSCULAR | Status: AC | PRN
  Administered 2013-07-31: 10 mL
  Filled 2013-07-31: qty 10

## 2013-07-31 MED ORDER — ACETAMINOPHEN 325 MG PO TABS
650.0000 mg | ORAL_TABLET | Freq: Once | ORAL | Status: AC
  Administered 2013-07-31: 650 mg via ORAL

## 2013-07-31 MED ORDER — DIPHENHYDRAMINE HCL 25 MG PO CAPS
25.0000 mg | ORAL_CAPSULE | Freq: Once | ORAL | Status: AC
  Administered 2013-07-31: 25 mg via ORAL

## 2013-07-31 MED ORDER — ACETAMINOPHEN 325 MG PO TABS
ORAL_TABLET | ORAL | Status: AC
Start: 2013-07-31 — End: 2013-07-31
  Filled 2013-07-31: qty 2

## 2013-07-31 NOTE — Telephone Encounter (Signed)
Received call from Brenton Grills from [facility] stating pt is due pneumovax & flu vax & prevnar 13 (new pneumonia vax that covers 13 strains) & wants to know if OK from Dr. Patsy Lager perspective to give.  Informed that Dr Cyndie Chime is out of the office & Rosanne Ashing expresses that it is OK to wait for his return next week & asked that we fax something to [phone], attention charge RN.  Call back # is [phone].

## 2013-08-01 LAB — TYPE AND SCREEN
Antibody Screen: NEGATIVE
Unit division: 0

## 2013-08-04 ENCOUNTER — Encounter (HOSPITAL_COMMUNITY): Payer: Self-pay | Admitting: *Deleted

## 2013-08-04 ENCOUNTER — Telehealth: Payer: Self-pay | Admitting: *Deleted

## 2013-08-04 NOTE — Telephone Encounter (Signed)
Notified [facility] that Dr Cyndie Chime is OK for pt to get vaccines mentioned last week.

## 2013-08-05 ENCOUNTER — Encounter (HOSPITAL_COMMUNITY): Payer: Self-pay | Admitting: *Deleted

## 2013-08-05 NOTE — H&P (Signed)
1. Andrew [name] 600.00 2. History of  Bladder Cancer V10.51 3. Chronic Renal Failure 585.9 4. History of  Nephrectomy With Total Ureterectomy Right  History of Present Illness  Andrew [name] returns today in f/u.  He has a history of bladder cancer and had a TURBT of a 7-55mm low grade non-invasive UCCa from the left bladder wall on 12/28/11. He had a normal left retrograde pyelogram at that time.  he has had a prior right nephroureterectomy and prior BCG therapy. He had a cystoscopy in May that showed some mininal erythema.  He is doing well without hematuria.  He is on dialysis and has oliguria.  He is voiding well but only once a week.   He has been diagnosed with myelodysplasia and is going to begin treatment with Dr. Marlena Clipper.   He was found to be anemic and has required transfusions.   Past Medical History 1. History of  Arthritis V13.4 2. History of  Asthma 493.90 3. History of  Bladder Cancer V10.51 4. History of  Cardiac Failure 428.9 5. History of  CT Abdominal Kidney Cysts 6. History of  Functional Murmur 785.2 7. History of  Gout 274.9 8. History of  Heart Disease 429.9 9. History of  Heartburn 787.1 10. History of  Hypercholesterolemia 272.0 11. History of  Hypertension 401.9 12. History of  Kidney Cancer V10.52 13. History of  Malignant Neoplasm Of The Lateral Wall Of The Bladder V10.51 14. History of  Myelodysplasia 742.59 15. History of  Renal Dialysis Status V45.11 16. History of  Renal Failure 586 17. History of  Transient Ischemic Attack 435.9  Surgical History 1. History of  Arteriovenous Surgery Creation Of A-V Fistula 2. History of  Cystoscopy With Biopsy 3. History of  Cystoscopy With Biopsy 4. History of  Cystoscopy With Biopsy 5. History of  Cystoscopy With Biopsy 6. History of  Cystoscopy With Fulguration Small Lesion (5-12mm) 7. History of  Cystoscopy With Fulguration Small Lesion (5-83mm) 8. History of  Heart Surgery 9. History of   Nephrectomy Right 10. History of  Nephrectomy With Total Ureterectomy Right 11. History of  Shunting Procedures 12. History of  Transurethral Fulguration For Post-op Bleeding  Current Meds 1. Albuterol Sulfate (2.5 MG/3ML) 0.083% Inhalation Nebulization Solution; Therapy:  (Recorded:16May2014) to 2. Allopurinol 100 MG Oral Tablet; Therapy: (Recorded:29Jan2008) to 3. Colchicine 0.6 MG TABS; Therapy: (Recorded:11Aug2009) to 4. Dialyvite 800/Zinc TABS; Therapy: (Recorded:11Aug2009) to 5. Ecotrin Low Strength 81 MG Oral Tablet Delayed Release; Therapy: (Recorded:29Jan2008) to 6. Gabapentin 100 MG Oral Capsule; TAKE 1 CAPSULE TWICE DAILY; Therapy: 21Jan2012 to 7. Lansoprazole 30 MG Oral Capsule Delayed Release; Therapy: 25Apr2012 to 8. Lipitor TABS; Therapy: (Recorded:21Jun2012) to 9. MiraLax Oral Powder; Therapy: (Recorded:11Aug2009) to 10. Renagel 800 MG Oral Tablet; Therapy: (Recorded:11Aug2009) to 11. Sensipar 30 MG Oral Tablet; Therapy: (Recorded:11Aug2009) to  Allergies 1. No Known Drug Allergies  Family History 1. Paternal history of  Death In The Family Father 2. Maternal history of  Death In The Family Mother 3. Family history of  Family Health Status Number Of Children 4. Fraternal history of  Renal Failure 5. Paternal history of  Reported Previous Cardiac Problems  Social History 1. Marital History - Widowed 2. Retired From Work 3. History of  Tobacco Use V15.82 Quit 35 years ago; smoked 1 pack per day for 20 years Denied  4. Alcohol Use 5. History of  Caffeine Use   Past and social history reviewed and updated.   Review of Systems  Genitourinary: no hematuria.  Gastrointestinal: no flank pain.  Constitutional: feeling tired (fatigue) (and weak), but no recent weight loss.  Cardiovascular: no chest pain.  Respiratory: shortness of breath (with recent pneumonia. ).    Vitals Vital Signs [Data Includes: Last 1 Day]  15Aug2014 09:20AM  Blood Pressure: 116 /  60 Temperature: 97.9 F Heart Rate: 74  Physical Exam Constitutional: Well nourished and well developed . No acute distress.  Pulmonary: No respiratory distress and normal respiratory rhythm and effort.  Cardiovascular: Heart rate and rhythm are normal . No peripheral edema. The rhythm was irregularly irregular. A grade 3/6 systolic murmur was heard.    Results/Data  Old records or history reviewed: outside records from his recent bone marrow biopsy reviewed.  The following clinical lab reports were reviewed:  His recent CBC shows anemia and mild thrombocytopenia.    Procedure  Procedure: Cystoscopy   Indication: History of Urothelial Carcinoma.  Prep: The patient was prepped with betadine.  Procedure Note:  Urethral meatus:. No abnormalities.  Anterior urethra: No abnormalities.  Prostatic urethra:. Estimated length was 3 cm. There was visual obstruction of the prostatic urethra. The lateral prostatic lobes were enlarged. No intravesical median lobe was visualized.  Bladder: The right ureteral orifice was absent. The left ureteral orifice was in the normal anatomic position. A systematic survey of the bladder demonstrated no bladder tumors or stones. Examination of the bladder demonstrated no trabeculation erythematous mucosa located on the right side, at the base of the bladder measuring approximately 1 cm that could be inflammatory or CIS. A saline bladder washing was obtained and sent for cytologic analysis. The patient tolerated the procedure well.  Complications: None.    Assessment 1. History of  Bladder Cancer V10.51 2. History of  Nephrectomy With Total Ureterectomy Right 3. 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   Plan Andrew [name] (600.00)  1. UA With REFLEX  Requested for: 05Aug2014 Malignant Neoplasm Of The Trigone Of The Bladder (188.0)  2. URINE  CYTOLOGY W/REFLEX FISH  Requested for: 15Aug2014 3. Follow-up Schedule Surgery Office  Follow-up  Requested for: 15Aug2014      Urine cytology with reflex today  I will get him set up for outpatient cystoscopy with biopsy and fulguration and reviewed the risks of bleeding, infection and bladder injury along with thrombotic events and anesthetic complications.  I will have him cleared by cardiology and check a CBC preop.    Discussion/Summary  CC: Dr. Casimiro Needle, Dr. Jerilee Field and Dr. Elfredia Nevins.

## 2013-08-05 NOTE — Progress Notes (Signed)
OV with EKG cardiology-  Harriet Pho NP  8/14 ON CHART-    NOTE HIGHLITED AND FLAGGED FOR ANESTHESIA RECOMMENDATIONS.   Daughter states both AV FISTULA GRAFTS in ARMS ARE NONFUNCTIONING.  Received Dialysis M-W-F.  States is feeling much better following blood transfusion 07/31/13

## 2013-08-07 ENCOUNTER — Encounter (HOSPITAL_COMMUNITY): Payer: Self-pay | Admitting: *Deleted

## 2013-08-07 ENCOUNTER — Ambulatory Visit (HOSPITAL_COMMUNITY): Payer: Medicare Other

## 2013-08-07 ENCOUNTER — Ambulatory Visit (HOSPITAL_COMMUNITY): Payer: Medicare Other | Admitting: *Deleted

## 2013-08-07 ENCOUNTER — Encounter (HOSPITAL_COMMUNITY)
Admission: RE | Disposition: A | Discharge status: Home / Self Care | Payer: Self-pay | Source: Ambulatory Visit | Attending: Urology

## 2013-08-07 ENCOUNTER — Ambulatory Visit (HOSPITAL_COMMUNITY)
Admission: RE | Admit: 2013-08-07 | Discharge: 2013-08-07 | Disposition: A | Discharge status: Home / Self Care | Payer: Medicare Other | Source: Ambulatory Visit | Attending: Urology | Admitting: Urology

## 2013-08-07 DIAGNOSIS — N329 Bladder disorder, unspecified: Secondary | ICD-10-CM | POA: Insufficient documentation

## 2013-08-07 DIAGNOSIS — Z8551 Personal history of malignant neoplasm of bladder: Secondary | ICD-10-CM | POA: Insufficient documentation

## 2013-08-07 DIAGNOSIS — Z8673 Personal history of transient ischemic attack (TIA), and cerebral infarction without residual deficits: Secondary | ICD-10-CM | POA: Insufficient documentation

## 2013-08-07 DIAGNOSIS — N189 Chronic kidney disease, unspecified: Secondary | ICD-10-CM | POA: Insufficient documentation

## 2013-08-07 DIAGNOSIS — E7800 Pure hypercholesterolemia, unspecified: Secondary | ICD-10-CM | POA: Insufficient documentation

## 2013-08-07 DIAGNOSIS — I129 Hypertensive chronic kidney disease with stage 1 through stage 4 chronic kidney disease, or unspecified chronic kidney disease: Secondary | ICD-10-CM | POA: Insufficient documentation

## 2013-08-07 DIAGNOSIS — N400 Enlarged prostate without lower urinary tract symptoms: Secondary | ICD-10-CM | POA: Insufficient documentation

## 2013-08-07 DIAGNOSIS — Z79899 Other long term (current) drug therapy: Secondary | ICD-10-CM | POA: Insufficient documentation

## 2013-08-07 DIAGNOSIS — Z85528 Personal history of other malignant neoplasm of kidney: Secondary | ICD-10-CM | POA: Insufficient documentation

## 2013-08-07 DIAGNOSIS — M109 Gout, unspecified: Secondary | ICD-10-CM | POA: Insufficient documentation

## 2013-08-07 DIAGNOSIS — Z906 Acquired absence of other parts of urinary tract: Secondary | ICD-10-CM | POA: Insufficient documentation

## 2013-08-07 DIAGNOSIS — Z905 Acquired absence of kidney: Secondary | ICD-10-CM | POA: Insufficient documentation

## 2013-08-07 HISTORY — PX: FULGURATION OF BLADDER TUMOR: SHX6261

## 2013-08-07 HISTORY — PX: CYSTOSCOPY WITH BIOPSY: SHX5122

## 2013-08-07 LAB — BASIC METABOLIC PANEL
CO2: 32 mEq/L (ref 19–32)
Chloride: 91 mEq/L — ABNORMAL LOW (ref 96–112)
Glucose, Bld: 92 mg/dL (ref 70–99)
Potassium: 4.3 mEq/L (ref 3.5–5.1)
Sodium: 135 mEq/L (ref 135–145)

## 2013-08-07 LAB — CBC
HCT: 29.2 % — ABNORMAL LOW (ref 39.0–52.0)
Hemoglobin: 9.4 g/dL — ABNORMAL LOW (ref 13.0–17.0)
MCH: 33.5 pg (ref 26.0–34.0)
MCV: 103.9 fL — ABNORMAL HIGH (ref 78.0–100.0)
RBC: 2.81 MIL/uL — ABNORMAL LOW (ref 4.22–5.81)

## 2013-08-07 SURGERY — CYSTOSCOPY, WITH BIOPSY
Anesthesia: Monitor Anesthesia Care | Wound class: Clean Contaminated

## 2013-08-07 MED ORDER — SODIUM CHLORIDE 0.9 % IV SOLN
INTRAVENOUS | Status: DC
  Administered 2013-08-07: 11:00:00 via INTRAVENOUS

## 2013-08-07 MED ORDER — STERILE WATER FOR IRRIGATION IR SOLN
Status: DC | PRN
  Administered 2013-08-07: 3000 mL via INTRAVESICAL

## 2013-08-07 MED ORDER — FENTANYL CITRATE 0.05 MG/ML IJ SOLN: 25.0000 ug | INTRAMUSCULAR | Status: DC | PRN

## 2013-08-07 MED ORDER — MIDODRINE HCL 5 MG PO TABS
20.0000 mg | ORAL_TABLET | Freq: Three times a day (TID) | ORAL | Status: AC
  Administered 2013-08-07: 20 mg via ORAL
  Filled 2013-08-07: qty 4

## 2013-08-07 MED ORDER — LIDOCAINE HCL (CARDIAC) 20 MG/ML IV SOLN
INTRAVENOUS | Status: DC | PRN
  Administered 2013-08-07: 40 mg via INTRAVENOUS

## 2013-08-07 MED ORDER — LIDOCAINE HCL 2 % EX GEL
CUTANEOUS | Status: AC
  Filled 2013-08-07: qty 10

## 2013-08-07 MED ORDER — LIDOCAINE HCL 2 % EX GEL
CUTANEOUS | Status: DC | PRN
  Administered 2013-08-07: 1 via URETHRAL

## 2013-08-07 MED ORDER — METHYLPREDNISOLONE SODIUM SUCC 125 MG IJ SOLR
INTRAMUSCULAR | Status: AC
  Filled 2013-08-07: qty 2

## 2013-08-07 MED ORDER — CIPROFLOXACIN IN D5W 400 MG/200ML IV SOLN: 400.0000 mg | INTRAVENOUS | Status: DC

## 2013-08-07 MED ORDER — PROPOFOL INFUSION 10 MG/ML OPTIME
INTRAVENOUS | Status: DC | PRN
  Administered 2013-08-07: 160 ug/kg/min via INTRAVENOUS

## 2013-08-07 MED ORDER — BELLADONNA ALKALOIDS-OPIUM 16.2-60 MG RE SUPP
RECTAL | Status: AC
  Filled 2013-08-07: qty 1

## 2013-08-07 MED ORDER — CIPROFLOXACIN IN D5W 200 MG/100ML IV SOLN
200.0000 mg | Freq: Two times a day (BID) | INTRAVENOUS | Status: DC
  Administered 2013-08-07: 200 mg via INTRAVENOUS
  Filled 2013-08-07: qty 100

## 2013-08-07 SURGICAL SUPPLY — 8 items
BAG URO CATCHER STRL LF (DRAPE) ×2 IMPLANT
CLOTH BEACON ORANGE TIMEOUT ST (SAFETY) ×2 IMPLANT
DRAPE CAMERA CLOSED 9X96 (DRAPES) ×2 IMPLANT
GLOVE SURG SS PI 8.0 STRL IVOR (GLOVE) ×2 IMPLANT
GOWN PREVENTION PLUS XLARGE (GOWN DISPOSABLE) ×2 IMPLANT
GOWN STRL REIN XL XLG (GOWN DISPOSABLE) ×2 IMPLANT
MANIFOLD NEPTUNE II (INSTRUMENTS) ×2 IMPLANT
TUBING CONNECTING 10 (TUBING) ×2 IMPLANT

## 2013-08-07 NOTE — Interval H&P Note (Signed)
History and Physical Interval Note:   I have reviewed his labs and discussed his recent history.   His platelet count is low at 57K.   08/07/2013 11:14 AM  Andrew [name]  has presented today for surgery, with the diagnosis of BLADDER LESIONS  The various methods of treatment have been discussed with the patient and family. After consideration of risks, benefits and other options for treatment, the patient has consented to  Procedure(s): CYSTOSCOPY WITH BLADDER BIOPSY (N/A) FULGURATION OF BLADDER TUMOR (N/A) as a surgical intervention .  The patient's history has been reviewed, patient examined, no change in status, stable for surgery.  I have reviewed the patient's chart and labs.  Questions were answered to the patient's satisfaction.     Arista Kettlewell J

## 2013-08-07 NOTE — Brief Op Note (Signed)
08/07/2013  12:19 PM  PATIENT:  Andrew [name]  79 y.o. male  PRE-OPERATIVE DIAGNOSIS:  BLADDER LESIONS  POST-OPERATIVE DIAGNOSIS:  bladder lesions  PROCEDURE:  Procedure(s): CYSTOSCOPY WITH BLADDER BIOPSY (N/A) FULGURATION OF BLADDER TUMOR (N/A)  SURGEON:  Surgeon(s) and Role:    * [name], MD - Primary  PHYSICIAN ASSISTANT:   ASSISTANTS: none   ANESTHESIA:   local and MAC  EBL:     BLOOD ADMINISTERED:none  DRAINS: none   LOCAL MEDICATIONS USED:  LIDOCAINE   SPECIMEN:  Source of Specimen:  bladder dome  DISPOSITION OF SPECIMEN:  PATHOLOGY  COUNTS:  YES  TOURNIQUET:  * No tourniquets in log *  DICTATION: .Other Dictation: Dictation Number [id number]    PLAN OF CARE: Discharge to home after PACU  PATIENT DISPOSITION:  PACU - hemodynamically stable.   Delay start of Pharmacological VTE agent (>24hrs) due to surgical blood loss or risk of bleeding: yes

## 2013-08-07 NOTE — Transfer of Care (Signed)
Immediate Anesthesia Transfer of Care Note  Patient: Andrew [name]  Procedure(s) Performed: Procedure(s): CYSTOSCOPY WITH BLADDER BIOPSY (N/A) FULGURATION OF BLADDER TUMOR (N/A)  Patient Location: PACU  Anesthesia Type:MAC  Level of Consciousness: oriented and patient cooperative  Airway & Oxygen Therapy: Patient Spontanous Breathing and Patient connected to face mask oxygen  Post-op Assessment: Report given to PACU RN, Post -op Vital signs reviewed and stable and Patient moving all extremities  Post vital signs: Reviewed and stable  Complications: No apparent anesthesia complications

## 2013-08-07 NOTE — Op Note (Signed)
NAME:  Andrew [name], MATTON NO.:  [id number]  MEDICAL RECORD NO.:  [id number]  LOCATION:  [facility]                         FACILITY:  [facility]  PHYSICIAN:  Excell Seltzer. Annabell Howells, M.D.    DATE OF BIRTH:  May 06, 1926  DATE OF PROCEDURE:  08/07/2013 DATE OF DISCHARGE:  08/07/2013                              OPERATIVE REPORT   PROCEDURE:  Cystoscopy with bladder biopsy and fulguration.  PREOPERATIVE DIAGNOSIS:  Bladder wall lesion.  POSTOPERATIVE DIAGNOSIS:  Bladder wall lesion.  SURGEON:  Excell Seltzer. Annabell Howells, M.D.  ANESTHESIA:  MAC and local.  SPECIMENS:  Bladder biopsy from the dome.  DRAINS:  None.  COMPLICATIONS:  None.  INDICATIONS:  Mr. Zeiner is an 79 year old white male with a history of bladder cancer who was found to have an erythematous lesion on the dome of the bladder that was felt to require biopsy and fulguration.  He has end-stage renal disease also mild dysplasia.  He is currently managed for that.  His preoperative platelet count was 57.  FINDINGS AND PROCEDURE:  He was given 200 mg of Cipro IV.  He was taken to the operating room where he was placed on the table in the lithotomy position.  He was given sedation as needed.  His perineum and genitalia were prepped with Betadine solution and his urethra was instilled with 10 mL of 2% lidocaine jelly and penile clamp was applied.  He was then draped in usual sterile fashion.  Cystoscopy was performed using a 22-French scope and 12 and 70 degree lenses.  Examination revealed a normal urethra.  The external sphincter was intact.  Prostatic urethra was about 2 cm in length with some bilobar hyperplasia with mild obstruction.  Examination of the bladder revealed mild trabeculation.  Ureteral orifices were in the normal anatomic position.  There was an erythematous area on the dome of the bladder that was possibly inflammatory or possible carcinoma in situ.  A cup biopsy forceps was used to take a single biopsy.  The  biopsy site surrounding erythema were then generously fulgurated with a Bugbee electrode.  Once hemostasis was fully achieved, the bladder was drained, the scope was removed.  The patient was taken down from lithotomy position and moved to recovery room in stable condition after sedation resolved. There were no complications.     Excell Seltzer. Annabell Howells, M.D.     JJW/MEDQ  D:  08/07/2013  T:  08/07/2013  Job:  [id number]

## 2013-08-07 NOTE — Anesthesia Postprocedure Evaluation (Signed)
  Anesthesia Post-op Note  Patient: Andrew [name]  Procedure(s) Performed: Procedure(s) (LRB): CYSTOSCOPY WITH BLADDER BIOPSY (N/A) FULGURATION OF BLADDER TUMOR (N/A)  Patient Location: PACU  Anesthesia Type: MAC  Level of Consciousness: awake and alert   Airway and Oxygen Therapy: Patient Spontanous Breathing  Post-op Pain: mild  Post-op Assessment: Post-op Vital signs reviewed, Patient's Cardiovascular Status Stable, Respiratory Function Stable, Patent Airway and No signs of Nausea or vomiting  Last Vitals:  Filed Vitals:   08/07/13 1315  BP: 102/27  Pulse: 70  Temp: 36.6 C  Resp: 16    Post-op Vital Signs: stable   Complications: No apparent anesthesia complications

## 2013-08-07 NOTE — Anesthesia Preprocedure Evaluation (Addendum)
Anesthesia Evaluation  Patient identified by MRN, date of birth, ID band Patient awake    Reviewed: Allergy & Precautions, H&P , NPO status , Patient's Chart, lab work & pertinent test results, reviewed documented beta blocker date and time   History of Anesthesia Complications Negative for: history of anesthetic complications  Airway Mallampati: II TM Distance: >3 FB     Dental  (+) Teeth Intact, Dental Advisory Given and Caps   Pulmonary shortness of breath and with exertion, former smoker,  breath sounds clear to auscultation        Cardiovascular hypertension, Pt. on medications + CAD and + CABG (1998) Rhythm:Regular Rate:Normal  02/2012 Echo: EF 65%, basal infolat AK, mild conc LVH;  b. 06/2013 Echo: EF 40-45%, mild LVH, Gr2 DD, basal inf HK->AK, Mod AS, Mild MR, PASP 73   Neuro/Psych  Neuromuscular disease    GI/Hepatic Neg liver ROS, PUD, GERD-  Medicated and Controlled,  Endo/Other  negative endocrine ROS  Renal/GU ESRF and DialysisRenal disease     Musculoskeletal   Abdominal   Peds  Hematology negative hematology ROS (+) Blood dyscrasia, anemia , PLT 57k   Anesthesia Other Findings Caps and bridges  Reproductive/Obstetrics                          Anesthesia Physical  Anesthesia Plan  ASA: IV  Anesthesia Plan: MAC   Post-op Pain Management:    Induction:   Airway Management Planned: Simple Face Mask  Additional Equipment:   Intra-op Plan:   Post-operative Plan:   Informed Consent: I have reviewed the patients History and Physical, chart, labs and discussed the procedure including the risks, benefits and alternatives for the proposed anesthesia with the patient or authorized representative who has indicated his/her understanding and acceptance.   Dental advisory given  Plan Discussed with: CRNA, Anesthesiologist and Surgeon  Anesthesia Plan Comments: (Long discussion with  pt, daughter and Dr. Annabell Howells. Will do under MAC with local. Risk of bleeding and low plt makes for high risk)       Anesthesia Quick Evaluation

## 2013-08-08 ENCOUNTER — Encounter (HOSPITAL_COMMUNITY): Payer: Self-pay | Admitting: Urology

## 2013-08-13 ENCOUNTER — Encounter (HOSPITAL_COMMUNITY): Payer: Self-pay | Admitting: *Deleted

## 2013-08-13 ENCOUNTER — Inpatient Hospital Stay (HOSPITAL_COMMUNITY)
Admission: EM | Admit: 2013-08-13 | Discharge: 2013-08-14 | DRG: 811 | Disposition: A | Discharge status: Home / Self Care | Payer: Medicare Other | Attending: Internal Medicine | Admitting: Internal Medicine

## 2013-08-13 ENCOUNTER — Emergency Department (HOSPITAL_COMMUNITY): Payer: Medicare Other

## 2013-08-13 DIAGNOSIS — Z833 Family history of diabetes mellitus: Secondary | ICD-10-CM

## 2013-08-13 DIAGNOSIS — K219 Gastro-esophageal reflux disease without esophagitis: Secondary | ICD-10-CM | POA: Diagnosis present

## 2013-08-13 DIAGNOSIS — I509 Heart failure, unspecified: Secondary | ICD-10-CM | POA: Diagnosis present

## 2013-08-13 DIAGNOSIS — I359 Nonrheumatic aortic valve disorder, unspecified: Secondary | ICD-10-CM | POA: Diagnosis present

## 2013-08-13 DIAGNOSIS — N186 End stage renal disease: Secondary | ICD-10-CM | POA: Diagnosis present

## 2013-08-13 DIAGNOSIS — H9190 Unspecified hearing loss, unspecified ear: Secondary | ICD-10-CM | POA: Diagnosis present

## 2013-08-13 DIAGNOSIS — Z951 Presence of aortocoronary bypass graft: Secondary | ICD-10-CM

## 2013-08-13 DIAGNOSIS — R079 Chest pain, unspecified: Secondary | ICD-10-CM

## 2013-08-13 DIAGNOSIS — Z9861 Coronary angioplasty status: Secondary | ICD-10-CM

## 2013-08-13 DIAGNOSIS — Z23 Encounter for immunization: Secondary | ICD-10-CM

## 2013-08-13 DIAGNOSIS — Z79899 Other long term (current) drug therapy: Secondary | ICD-10-CM

## 2013-08-13 DIAGNOSIS — I10 Essential (primary) hypertension: Secondary | ICD-10-CM

## 2013-08-13 DIAGNOSIS — G5790 Unspecified mononeuropathy of unspecified lower limb: Secondary | ICD-10-CM | POA: Diagnosis present

## 2013-08-13 DIAGNOSIS — I959 Hypotension, unspecified: Secondary | ICD-10-CM | POA: Diagnosis present

## 2013-08-13 DIAGNOSIS — Z961 Presence of intraocular lens: Secondary | ICD-10-CM

## 2013-08-13 DIAGNOSIS — I679 Cerebrovascular disease, unspecified: Secondary | ICD-10-CM | POA: Diagnosis present

## 2013-08-13 DIAGNOSIS — M109 Gout, unspecified: Secondary | ICD-10-CM | POA: Diagnosis present

## 2013-08-13 DIAGNOSIS — K7689 Other specified diseases of liver: Secondary | ICD-10-CM | POA: Diagnosis present

## 2013-08-13 DIAGNOSIS — Z992 Dependence on renal dialysis: Secondary | ICD-10-CM

## 2013-08-13 DIAGNOSIS — N2581 Secondary hyperparathyroidism of renal origin: Secondary | ICD-10-CM | POA: Diagnosis present

## 2013-08-13 DIAGNOSIS — Z8249 Family history of ischemic heart disease and other diseases of the circulatory system: Secondary | ICD-10-CM

## 2013-08-13 DIAGNOSIS — E785 Hyperlipidemia, unspecified: Secondary | ICD-10-CM | POA: Diagnosis present

## 2013-08-13 DIAGNOSIS — Z9849 Cataract extraction status, unspecified eye: Secondary | ICD-10-CM

## 2013-08-13 DIAGNOSIS — I5042 Chronic combined systolic (congestive) and diastolic (congestive) heart failure: Secondary | ICD-10-CM | POA: Diagnosis present

## 2013-08-13 DIAGNOSIS — R6510 Systemic inflammatory response syndrome (SIRS) of non-infectious origin without acute organ dysfunction: Secondary | ICD-10-CM

## 2013-08-13 DIAGNOSIS — Z8551 Personal history of malignant neoplasm of bladder: Secondary | ICD-10-CM

## 2013-08-13 DIAGNOSIS — A419 Sepsis, unspecified organism: Secondary | ICD-10-CM

## 2013-08-13 DIAGNOSIS — Z905 Acquired absence of kidney: Secondary | ICD-10-CM

## 2013-08-13 DIAGNOSIS — I5021 Acute systolic (congestive) heart failure: Secondary | ICD-10-CM

## 2013-08-13 DIAGNOSIS — I739 Peripheral vascular disease, unspecified: Secondary | ICD-10-CM | POA: Diagnosis present

## 2013-08-13 DIAGNOSIS — J189 Pneumonia, unspecified organism: Secondary | ICD-10-CM

## 2013-08-13 DIAGNOSIS — D649 Anemia, unspecified: Secondary | ICD-10-CM

## 2013-08-13 DIAGNOSIS — Z7982 Long term (current) use of aspirin: Secondary | ICD-10-CM

## 2013-08-13 DIAGNOSIS — D61818 Other pancytopenia: Secondary | ICD-10-CM

## 2013-08-13 DIAGNOSIS — I120 Hypertensive chronic kidney disease with stage 5 chronic kidney disease or end stage renal disease: Secondary | ICD-10-CM | POA: Diagnosis present

## 2013-08-13 DIAGNOSIS — D46C Myelodysplastic syndrome with isolated del(5q) chromosomal abnormality: Secondary | ICD-10-CM | POA: Diagnosis present

## 2013-08-13 DIAGNOSIS — Z87891 Personal history of nicotine dependence: Secondary | ICD-10-CM

## 2013-08-13 DIAGNOSIS — D631 Anemia in chronic kidney disease: Secondary | ICD-10-CM | POA: Diagnosis present

## 2013-08-13 DIAGNOSIS — I2510 Atherosclerotic heart disease of native coronary artery without angina pectoris: Secondary | ICD-10-CM | POA: Diagnosis present

## 2013-08-13 DIAGNOSIS — I2789 Other specified pulmonary heart diseases: Secondary | ICD-10-CM | POA: Diagnosis present

## 2013-08-13 DIAGNOSIS — G629 Polyneuropathy, unspecified: Secondary | ICD-10-CM

## 2013-08-13 DIAGNOSIS — Z85528 Personal history of other malignant neoplasm of kidney: Secondary | ICD-10-CM

## 2013-08-13 HISTORY — DX: Hypotension, unspecified: I95.9

## 2013-08-13 HISTORY — DX: Other pancytopenia: D61.818

## 2013-08-13 LAB — CBC WITH DIFFERENTIAL/PLATELET
Basophils Relative: 1 % (ref 0–1)
Eosinophils Relative: 3 % (ref 0–5)
Hemoglobin: 7 g/dL — ABNORMAL LOW (ref 13.0–17.0)
Lymphs Abs: 0.7 10*3/uL (ref 0.7–4.0)
MCH: 34.1 pg — ABNORMAL HIGH (ref 26.0–34.0)
MCV: 102 fL — ABNORMAL HIGH (ref 78.0–100.0)
Monocytes Absolute: 0.1 10*3/uL (ref 0.1–1.0)
Neutro Abs: 0.2 10*3/uL — ABNORMAL LOW (ref 1.7–7.7)
Platelets: 33 10*3/uL — ABNORMAL LOW (ref 150–400)
RBC: 2.05 MIL/uL — ABNORMAL LOW (ref 4.22–5.81)

## 2013-08-13 LAB — BASIC METABOLIC PANEL
CO2: 37 mEq/L — ABNORMAL HIGH (ref 19–32)
Calcium: 8 mg/dL — ABNORMAL LOW (ref 8.4–10.5)
Creatinine, Ser: 1.74 mg/dL — ABNORMAL HIGH (ref 0.50–1.35)
Glucose, Bld: 90 mg/dL (ref 70–99)

## 2013-08-13 LAB — PREPARE RBC (CROSSMATCH)

## 2013-08-13 MED ORDER — GABAPENTIN 100 MG PO CAPS
100.0000 mg | ORAL_CAPSULE | Freq: Every day | ORAL | Status: DC
  Administered 2013-08-14: 100 mg via ORAL
  Filled 2013-08-13: qty 1

## 2013-08-13 MED ORDER — SODIUM CHLORIDE 0.9 % IV BOLUS (SEPSIS)
500.0000 mL | Freq: Once | INTRAVENOUS | Status: AC
  Administered 2013-08-13: 500 mL via INTRAVENOUS

## 2013-08-13 MED ORDER — POLYETHYLENE GLYCOL 3350 17 GM/SCOOP PO POWD
17.0000 g | Freq: Every day | ORAL | Status: DC
  Filled 2013-08-13: qty 255

## 2013-08-13 MED ORDER — ASPIRIN EC 81 MG PO TBEC
81.0000 mg | DELAYED_RELEASE_TABLET | Freq: Every day | ORAL | Status: DC
  Filled 2013-08-13 (×2): qty 1

## 2013-08-13 MED ORDER — ALBUTEROL SULFATE HFA 108 (90 BASE) MCG/ACT IN AERS
2.0000 | INHALATION_SPRAY | Freq: Four times a day (QID) | RESPIRATORY_TRACT | Status: DC | PRN
  Filled 2013-08-13: qty 6.7

## 2013-08-13 MED ORDER — DOCUSATE SODIUM 100 MG PO CAPS
100.0000 mg | ORAL_CAPSULE | Freq: Every day | ORAL | Status: DC
  Administered 2013-08-14: 100 mg via ORAL
  Filled 2013-08-13: qty 1

## 2013-08-13 MED ORDER — SEVELAMER CARBONATE 800 MG PO TABS
3200.0000 mg | ORAL_TABLET | Freq: Three times a day (TID) | ORAL | Status: DC
  Administered 2013-08-14: 1600 mg via ORAL
  Administered 2013-08-14: 3200 mg via ORAL
  Filled 2013-08-13 (×4): qty 4

## 2013-08-13 MED ORDER — ACETAMINOPHEN 325 MG PO TABS: 650.0000 mg | ORAL_TABLET | Freq: Four times a day (QID) | ORAL | Status: DC | PRN

## 2013-08-13 MED ORDER — ACETAMINOPHEN 650 MG RE SUPP: 650.0000 mg | Freq: Four times a day (QID) | RECTAL | Status: DC | PRN

## 2013-08-13 MED ORDER — SODIUM CHLORIDE 0.9 % IJ SOLN
3.0000 mL | Freq: Two times a day (BID) | INTRAMUSCULAR | Status: DC
  Administered 2013-08-13 – 2013-08-14 (×2): 3 mL via INTRAVENOUS

## 2013-08-13 MED ORDER — CINACALCET HCL 30 MG PO TABS
30.0000 mg | ORAL_TABLET | Freq: Every day | ORAL | Status: DC
  Administered 2013-08-14: 30 mg via ORAL
  Filled 2013-08-13 (×2): qty 1

## 2013-08-13 MED ORDER — GABAPENTIN 100 MG PO CAPS
200.0000 mg | ORAL_CAPSULE | Freq: Every day | ORAL | Status: DC
  Administered 2013-08-13: 200 mg via ORAL
  Filled 2013-08-13 (×2): qty 2

## 2013-08-13 MED ORDER — MIDODRINE HCL 5 MG PO TABS
20.0000 mg | ORAL_TABLET | Freq: Two times a day (BID) | ORAL | Status: DC
  Administered 2013-08-13 – 2013-08-14 (×2): 20 mg via ORAL
  Filled 2013-08-13 (×3): qty 4

## 2013-08-13 MED ORDER — INFLUENZA VAC SPLIT QUAD 0.5 ML IM SUSP
0.5000 mL | INTRAMUSCULAR | Status: AC
  Administered 2013-08-14: 0.5 mL via INTRAMUSCULAR
  Filled 2013-08-13: qty 0.5

## 2013-08-13 MED ORDER — DIALYVITE PO TABS: 1.0000 | ORAL_TABLET | Freq: Every day | ORAL | Status: DC

## 2013-08-13 MED ORDER — COLCHICINE 0.6 MG PO TABS
0.6000 mg | ORAL_TABLET | Freq: Two times a day (BID) | ORAL | Status: DC | PRN
  Filled 2013-08-13: qty 1

## 2013-08-13 MED ORDER — PANTOPRAZOLE SODIUM 40 MG PO TBEC
40.0000 mg | DELAYED_RELEASE_TABLET | Freq: Every day | ORAL | Status: DC
  Administered 2013-08-14: 40 mg via ORAL
  Filled 2013-08-13: qty 1

## 2013-08-13 MED ORDER — POLYETHYLENE GLYCOL 3350 17 G PO PACK
17.0000 g | PACK | Freq: Every day | ORAL | Status: DC
  Administered 2013-08-13: 17 g via ORAL
  Filled 2013-08-13 (×2): qty 1

## 2013-08-13 MED ORDER — RENA-VITE PO TABS
1.0000 | ORAL_TABLET | Freq: Every day | ORAL | Status: DC
  Administered 2013-08-13: 1 via ORAL
  Filled 2013-08-13 (×2): qty 1

## 2013-08-13 MED ORDER — CALCIUM CARBONATE ANTACID 500 MG PO CHEW
1.0000 | CHEWABLE_TABLET | Freq: Every day | ORAL | Status: DC | PRN
  Filled 2013-08-13: qty 1

## 2013-08-13 MED ORDER — ATORVASTATIN CALCIUM 10 MG PO TABS
10.0000 mg | ORAL_TABLET | Freq: Every morning | ORAL | Status: DC
  Administered 2013-08-14: 10 mg via ORAL
  Filled 2013-08-13: qty 1

## 2013-08-13 MED ORDER — ONDANSETRON HCL 4 MG/2ML IJ SOLN: 4.0000 mg | Freq: Four times a day (QID) | INTRAMUSCULAR | Status: DC | PRN

## 2013-08-13 MED ORDER — ASPIRIN 325 MG PO TABS
325.0000 mg | ORAL_TABLET | Freq: Once | ORAL | Status: AC
  Administered 2013-08-13: 325 mg via ORAL
  Filled 2013-08-13: qty 1

## 2013-08-13 MED ORDER — ONDANSETRON HCL 4 MG/2ML IJ SOLN: 4.0000 mg | Freq: Three times a day (TID) | INTRAMUSCULAR | Status: AC | PRN

## 2013-08-13 MED ORDER — ALLOPURINOL 100 MG PO TABS
200.0000 mg | ORAL_TABLET | Freq: Every day | ORAL | Status: DC
  Administered 2013-08-13: 200 mg via ORAL
  Filled 2013-08-13 (×2): qty 2

## 2013-08-13 MED ORDER — SODIUM CHLORIDE 0.9 % IJ SOLN: 3.0000 mL | Freq: Two times a day (BID) | INTRAMUSCULAR | Status: DC

## 2013-08-13 MED ORDER — ONDANSETRON HCL 4 MG PO TABS: 4.0000 mg | ORAL_TABLET | Freq: Four times a day (QID) | ORAL | Status: DC | PRN

## 2013-08-13 NOTE — ED Notes (Signed)
Informed Consent obtained and signed.

## 2013-08-13 NOTE — ED Notes (Signed)
Pt states his feet are very cold. Socks placed on pt's feet.

## 2013-08-13 NOTE — ED Notes (Signed)
Admitting MD at bedside.

## 2013-08-13 NOTE — H&P (Addendum)
[facility] History and Physical  Andrew [name] ZOX:[id number] DOB: 12-27-1925 DOA: 08/13/2013  Referring physician: ER physician. PCP: [name]., MD  Specialists: Dr. Cyndie Chime. Hematologist.                      Dr. Lowell Guitar. Nephrologist.  Chief Complaint: Shortness of breath and chest discomfort. Low hemoglobin.  HPI: Andrew [name] is a 79 y.o. male with history of ESRD on hemodialysis, mild dysplastic syndrome recently started on Revlimid, CAD status post CABG, systolic heart failure was referred to the ER patient was found to have low hemoglobin. Patient usually gets dialyzed on Monday Wednesday and Friday but over the last 3 days patient has been getting dialyzed every day. Today patient's nephrologist noticed that patient's hemoglobin was lower than usual and was referred to the ER. Chest x-ray EKG and cardiac markers were unremarkable. Patient's hemoglobin was found to be around 7 with known history of MDS patient at this time has been ordered to transfusion and admitted. Patient states that over the last 10 days patient has been getting easily short of breath with minimal exertion and his chest pain happens at rest also but presently chest pain-free and it is usually retrosternal nonradiating sometimes a burning sensation sometimes pressure-like. Patient denies any fever chills.  Review of Systems: As presented in the history of presenting illness, rest negative.  Past Medical History  Diagnosis Date  . ESRD on hemodialysis     Hemodialysis since 2003; Occluded access in both upper extremities and the right lower extremity, current access as of Aug 2014 is L thigh AVG. Gets HD MWF at [facility].     . Duodenal ulcer     remote  . Chronic combined systolic and diastolic CHF (congestive heart failure)     a. 02/2012 Echo: EF 65%, basal infolat AK, mild conc LVH;  b. 06/2013 Echo: EF 40-45%, mild LVH, Gr2 DD, basal inf HK->AK, Mod AS, Mild MR, PASP 73  .  Arteriosclerotic cardiovascular disease (ASCVD) 1998    a. [id number]; b. 06/2003 Cath: LM 70d, LAD 70ost, 80p, LCX 70p/110m, RCA 80p/111m, LIMA->LAD patent, VG->OM2->OM3 patent, VG->dRCA patent, EF 55%;  c. 02/2012 Lexi MV: inf/inflat scarring w/ ? minimal peri-infarct ischemia.  . Cerebrovascular disease   . Neuropathy of foot   . Degenerative joint disease   . Gastroesophageal reflux disease   . Impaired hearing     Bilateral; hearing aids relatively ineffective  . Hypertension   . Cholelithiasis 03/2011    Diagnosed incidentally on abdominal ultrasound in 03/2011  . Hepatic steatosis   . Macrocytosis 10/20/2012  . Thrombocytopenia 10/20/2012  . Gallstones   . Hyperlipidemia   . Secondary hyperparathyroidism   . Peripheral vascular disease   . Gout   . Moderate aortic stenosis     a. 06/2013 Echo: Mod AS, mean grad: 25, peak grad: 57.  . Pulmonary hypertension     a. 06/2013 Echo: PASP .  Marland Kitchen Anemia     Prior blood transfusion  . History of blood transfusion     last infusion 07/31/13  . Complication of anesthesia     daughter reports long episodes confusion after amnesia meds  . History of bladder cancer   . Renal cell carcinoma 2001    s/p right nephrectomy-2001; subsequent ESRD  . 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 Hypotension   . Pancytopenia    Past Surgical History  Procedure Laterality Date  . Right nephrectomy  2001    Renal cell carcinoma  . Colonoscopy  01/24/2011    prominent vascular pattern, suboptimal prep but doable  . Esophagogastroduodenoscopy  01/24/2011    mild erosive reflux esophagitis, bulbar/antral erosions, bx from antrum benign  . Av fistula repair  2008    revision of anastomosis of Right AVF  . Arteriovenous graft placement  2006    Thrombectomy and interposition jump graft revision to higher axillary vein of LUA AVG  . Coronary artery bypass graft  1998    X4 VESSELS  . Multiple cysto/ resection tumor's with bx's   LAST ONE 05-09-2011  . Multiple surg's / interventions for avgg/ fistula right upper arm  LAST REVISION 06-29-2011    (JUNE 2010 STENT CEPHALIC VEIN/ 03-23-2011 DILATATION ANGIOPLASTY)  . Left ptc left renal artery    . Cardiac catheterization  2004  . Cataract extraction w/ intraocular lens  implant, bilateral    . Cystoscopy w/ retrogrades  12/28/2011    Procedure: CYSTOSCOPY WITH RETROGRADE PYELOGRAM;  Surgeon: [name], MD;  Location: [facility];  Service: Urology;  Laterality: Left;  C-ARM   . Transurethral resection of bladder tumor  12/28/2011    Procedure: TRANSURETHRAL RESECTION OF BLADDER TUMOR (TURBT);  Surgeon: [name], MD;  Location: [facility];  Service: Urology;  Laterality: N/A;  . Av fistula placement  02/29/2012    Procedure: INSERTION OF ARTERIOVENOUS (AV) GORE-TEX GRAFT THIGH;  Surgeon: [name], MD;  Location: [facility] OR;  Service: Vascular;  Laterality: Right;  Insertion right femoral arteriovenous gortex graft  . Ligation of right braciocephalic av fistula  04-04-2012  . Insertion of dialysis catheter  06/05/2012    Procedure: INSERTION OF DIALYSIS CATHETER;  Surgeon: Pryor Ochoa, MD;  Location: [facility] OR;  Service: Vascular;  Laterality: Left;  Insertion diatek catheter left IJ  . Insertion of dialysis catheter  08/22/2012    Procedure: INSERTION OF DIALYSIS CATHETER;  Surgeon: Nada Libman, MD;  Location: [facility];  Service: Vascular;  Laterality: Left;  insertion of dialysis catheter left  internal jugular 27cm  . Av fistula placement  10/08/2012    Procedure: INSERTION OF ARTERIOVENOUS (AV) GORE-TEX GRAFT THIGH;  Surgeon: Chuck Hint, MD;  Location: [facility] OR;  Service: Vascular;  Laterality: Left;  . Cystoscopy with biopsy N/A 08/07/2013    Procedure: CYSTOSCOPY WITH BLADDER BIOPSY;  Surgeon: [name], MD;  Location: [facility];  Service: Urology;  Laterality: N/A;  . Fulguration of bladder tumor N/A 08/07/2013     Procedure: FULGURATION OF BLADDER TUMOR;  Surgeon: [name], MD;  Location: [facility];  Service: Urology;  Laterality: N/A;   Social History:  reports that he quit smoking about 29 years ago. His smoking use included Cigarettes. He has a 25 pack-year smoking history. He has quit using smokeless tobacco. He reports that he does not drink alcohol or use illicit drugs. Home. where does patient live-- Can do ADLs. Can patient participate in ADLs?  No Known Allergies  Family History  Problem Relation Age of Onset  . Colon cancer Neg Hx   . Liver disease Neg Hx   . Anesthesia problems Neg Hx   . Heart disease Father   . Other Mother     died @ 57 with respiratory issues following hip fx  . Diabetes Mother       Prior to Admission medications   Medication Sig Start Date End Date Taking? Authorizing  Provider  acetaminophen (TYLENOL) 500 MG tablet Take 500 mg by mouth every 6 (six) hours as needed. For pain/headache   Yes Historical Provider, MD  albuterol (PROVENTIL HFA;VENTOLIN HFA) 108 (90 BASE) MCG/ACT inhaler Inhale 2 puffs into the lungs every 6 (six) hours as needed for wheezing or shortness of breath. 06/23/13  Yes Jodelle Gross, NP  allopurinol (ZYLOPRIM) 100 MG tablet Take 200 mg by mouth at bedtime.  09/19/11  Yes Rhetta Mura, MD  aspirin EC 81 MG tablet Take 81 mg by mouth at bedtime.    Yes Historical Provider, MD  atorvastatin (LIPITOR) 10 MG tablet Take 10 mg by mouth every morning.    Yes Historical Provider, MD  B Complex-C-Folic Acid (DIALYVITE TABLET) TABS Take 1 tablet by mouth at bedtime.  05/24/12  Yes Historical Provider, MD  calcium carbonate (TUMS - DOSED IN MG ELEMENTAL CALCIUM) 500 MG chewable tablet Chew 1 tablet by mouth daily as needed for heartburn. For heartburn   Yes Historical Provider, MD  cinacalcet (SENSIPAR) 30 MG tablet Take 30 mg by mouth daily with breakfast.    Yes Historical Provider, MD  colchicine (COLCRYS) 0.6 MG tablet Take 0.6 mg by  mouth 2 (two) times daily as needed. for gout   Yes Historical Provider, MD  docusate sodium (COLACE) 100 MG capsule Take 100 mg by mouth daily.   Yes Historical Provider, MD  gabapentin (NEURONTIN) 100 MG capsule Take 100 mg by mouth 2 (two) times daily. Takes 1 in the morning and 2 at night   Yes Historical Provider, MD  lenalidomide (REVLIMID) 5 MG capsule One po after each dialysis Mon-Wed-Fri 07/17/13  Yes Levert Feinstein, MD  midodrine (PROAMATINE) 10 MG tablet Take 20 mg by mouth 2 (two) times daily.  08/26/12  Yes Ripudeep Jenna Luo, MD  omeprazole (PRILOSEC) 40 MG capsule Take 40 mg by mouth 2 (two) times daily. For reflux   Yes Historical Provider, MD  polyethylene glycol powder (MIRALAX) powder Take 17 g by mouth at bedtime.    Yes Historical Provider, MD  sevelamer (RENVELA) 800 MG tablet Take 2,400 mg by mouth 3 (three) times daily with meals. 4 tabs 3 x daily and 2 with snack   Yes Historical Provider, MD   Physical Exam: Filed Vitals:   08/13/13 2100 08/13/13 2109 08/13/13 2130 08/13/13 2139  BP: 93/44 99/44 118/53 118/53  Pulse: 87 88 84 86  Temp:  97.6 F (36.4 C)  98 F (36.7 C)  TempSrc:  Oral  Oral  Resp: 31 27 23 20   SpO2: 96% 95% 94% 97%     General:  Well-developed and nourished.  Eyes: Anicteric no pallor.  ENT: No discharge from ears eyes nose and mouth.  Neck: No mass felt.  Cardiovascular: S1-S2 heard.  Respiratory: No rhonchi or crepitations.  Abdomen: Soft nontender bowel sounds present.  Skin: No rash.  Musculoskeletal: No edema.  Psychiatric: Appears normal.  Neurologic: Alert awake oriented to time place and person. Moves all extremities.  Labs on Admission:  Basic Metabolic Panel:  Recent Labs Lab 08/07/13 0937 08/13/13 1915  NA 135 140  K 4.3 3.4*  CL 91* 97  CO2 32 37*  GLUCOSE 92 90  BUN 30* 9  CREATININE 5.45* 1.74*  CALCIUM 8.2* 8.0*   Liver Function Tests: No results found for this basename: AST, ALT, ALKPHOS, BILITOT,  PROT, ALBUMIN,  in the last 168 hours No results found for this basename: LIPASE, AMYLASE,  in the last  168 hours No results found for this basename: AMMONIA,  in the last 168 hours CBC:  Recent Labs Lab 08/07/13 0937 08/13/13 1915  WBC 2.0* 1.0*  NEUTROABS  --  0.2*  HGB 9.4* 7.0*  HCT 29.2* 20.9*  MCV 103.9* 102.0*  PLT 57* 33*   Cardiac Enzymes:  Recent Labs Lab 08/13/13 1915  TROPONINI <0.30    BNP (last 3 results)  Recent Labs  06/14/13 2309  PROBNP 25066.0*   CBG: No results found for this basename: GLUCAP,  in the last 168 hours  Radiological Exams on Admission: Dg Chest 2 View  08/13/2013   CLINICAL DATA:  Chest pain, shortness of breast.  EXAM: CHEST  2 VIEW  COMPARISON:  08/07/2013  FINDINGS: Previous CABG. Surgical clips in the right upper abdomen. Metallic stent in the right subclavian region as before. Lungs clear. Heart size upper limits normal. No effusion. Atheromatous aorta.  IMPRESSION: No acute cardiopulmonary disease.  Stable postoperative changes as above.   Electronically Signed   By: Oley Balm M.D.   On: 08/13/2013 19:48    EKG: Independently reviewed. Normal sinus rhythm with PACs and PVCs.  Assessment/Plan Principal Problem:   Anemia Active Problems:   ESRD (end stage renal disease)   CAD (coronary artery disease)   MDS (myelodysplastic syndrome) with 5q deletion   1. Symptomatic anemia with history of MDS - at this time packed red blood cell transfusion has been ordered. Recheck hemoglobin in a.m. I think patient's symptoms of shortness of breath and chest discomfort are from severe anemia. However since patient has history of CABG we will cycle cardiac markers. 2. MDS with pancytopenia - patient's nephrologist Dr. Lowell Guitar as advised to hold Revlimid today. Patient also has severe neutropenia but afebrile. Closely observe CBC. May discuss with patient's oncologist Dr. Cyndie Chime in a.m. 3. CAD status post CABG - see #1. 4. ESRD on  hemodialysis - consult nephrology for dialysis. Patient has been having dialysis every day for last 3 days. Usually gets dialyzed on Monday Wednesday and Friday. Patient presently is not acutely short of breath. 5. Systolic heart failure - patient's present symptoms are probably from anemia. Recent 2-D echo done last month showed EF of 40-45%. But given patient's history of CABG we will cycle cardiac markers. 6. History of bladder cancer has had recent cystoscopy with biopsy done last week.    Code Status: Full code.  Family Communication: Patient's daughter at the bedside.  Disposition Plan: Admit to inpatient.    Laderrick Wilk N. [facility] Pager [phone].  If 7PM-7AM, please contact night-coverage [email or web address] Password [facility] 08/13/2013, 10:04 PM

## 2013-08-13 NOTE — ED Notes (Signed)
Pt c/o indigestion. Pt states he thinks it is from the [place] sandwich.

## 2013-08-13 NOTE — ED Notes (Signed)
MD at bedside. 

## 2013-08-13 NOTE — ED Provider Notes (Signed)
CSN: [id number]     Arrival date & time 08/13/13  1730 History   First MD Initiated Contact with Patient 08/13/13 1749     Chief Complaint  Patient presents with  . Shortness of Breath   (Consider location/radiation/quality/duration/timing/severity/associated sxs/prior Treatment) Patient is a 79 y.o. male presenting with general illness. The history is provided by the patient. No language interpreter was used.  Illness Location:  Generalized fatigue, SOB with exertion, and intermittent mid sternal CP for about 1 week. Severity:  Moderate Onset quality:  Gradual Duration:  1 week Timing:  Constant Progression:  Worsening Chronicity:  Recurrent Context:  Spontaneously Relieved by:  Rest Worsened by:  Exerction Ineffective treatments:  None tried Associated symptoms: chest pain, cough, fatigue and shortness of breath   Associated symptoms: no abdominal pain, no congestion, no diarrhea, no fever, no headaches, no loss of consciousness, no myalgias, no nausea, no rash, no rhinorrhea, no sore throat and no vomiting   Chest pain:    Chest pain quality: tightness.   Severity:  Mild   Onset quality:  Unable to specify   Duration:  1 week   Timing:  Intermittent   Progression:  Waxing and waning   Chronicity:  Recurrent Cough:    Cough characteristics:  Non-productive   Sputum characteristics:  Nondescript   Severity:  Moderate   Onset quality:  Unable to specify   Duration:  1 week   Timing:  Constant   Progression:  Unchanged   Chronicity:  New Fatigue:    Severity:  Moderate   Duration:  1 week   Timing:  Constant   Progression:  Worsening Shortness of breath:    Severity:  Moderate   Onset quality:  Gradual   Duration:  1 week   Timing:  Intermittent   Progression:  Worsening Risk factors:  MDS with anemia, ESRD   Past Medical History  Diagnosis Date  . ESRD on hemodialysis     Hemodialysis since 2003; Occluded access in both upper extremities and the right lower  extremity, current access as of Aug 2014 is L thigh AVG. Gets HD MWF at [facility].     . Duodenal ulcer     remote  . Chronic combined systolic and diastolic CHF (congestive heart failure)     a. 02/2012 Echo: EF 65%, basal infolat AK, mild conc LVH;  b. 06/2013 Echo: EF 40-45%, mild LVH, Gr2 DD, basal inf HK->AK, Mod AS, Mild MR, PASP 73  . Arteriosclerotic cardiovascular disease (ASCVD) 1998    a. [id number]; b. 06/2003 Cath: LM 70d, LAD 70ost, 80p, LCX 70p/17m, RCA 80p/125m, LIMA->LAD patent, VG->OM2->OM3 patent, VG->dRCA patent, EF 55%;  c. 02/2012 Lexi MV: inf/inflat scarring w/ ? minimal peri-infarct ischemia.  . Cerebrovascular disease   . Neuropathy of foot   . Degenerative joint disease   . Gastroesophageal reflux disease   . Impaired hearing     Bilateral; hearing aids relatively ineffective  . Hypertension   . Cholelithiasis 03/2011    Diagnosed incidentally on abdominal ultrasound in 03/2011  . Hepatic steatosis   . Macrocytosis 10/20/2012  . Thrombocytopenia 10/20/2012  . Gallstones   . Hyperlipidemia   . Secondary hyperparathyroidism   . Peripheral vascular disease   . Gout   . Moderate aortic stenosis     a. 06/2013 Echo: Mod AS, mean grad: 25, peak grad: 57.  . Pulmonary hypertension     a. 06/2013 Echo: PASP .  Marland Kitchen Anemia     Prior  blood transfusion  . History of blood transfusion     last infusion 07/31/13  . Complication of anesthesia     daughter reports long episodes confusion after amnesia meds  . History of bladder cancer   . Renal cell carcinoma 2001    s/p right nephrectomy-2001; subsequent ESRD  . 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 Hypotension   . Pancytopenia    Past Surgical History  Procedure Laterality Date  . Right nephrectomy  2001    Renal cell carcinoma  . Colonoscopy  01/24/2011    prominent vascular pattern, suboptimal prep but doable  . Esophagogastroduodenoscopy  01/24/2011    mild erosive reflux  esophagitis, bulbar/antral erosions, bx from antrum benign  . Av fistula repair  2008    revision of anastomosis of Right AVF  . Arteriovenous graft placement  2006    Thrombectomy and interposition jump graft revision to higher axillary vein of LUA AVG  . Coronary artery bypass graft  1998    X4 VESSELS  . Multiple cysto/ resection tumor's with bx's  LAST ONE 05-09-2011  . Multiple surg's / interventions for avgg/ fistula right upper arm  LAST REVISION 06-29-2011    (JUNE 2010 STENT CEPHALIC VEIN/ 03-23-2011 DILATATION ANGIOPLASTY)  . Left ptc left renal artery    . Cardiac catheterization  2004  . Cataract extraction w/ intraocular lens  implant, bilateral    . Cystoscopy w/ retrogrades  12/28/2011    Procedure: CYSTOSCOPY WITH RETROGRADE PYELOGRAM;  Surgeon: [name], MD;  Location: [facility];  Service: Urology;  Laterality: Left;  C-ARM   . Transurethral resection of bladder tumor  12/28/2011    Procedure: TRANSURETHRAL RESECTION OF BLADDER TUMOR (TURBT);  Surgeon: [name], MD;  Location: [facility];  Service: Urology;  Laterality: N/A;  . Av fistula placement  02/29/2012    Procedure: INSERTION OF ARTERIOVENOUS (AV) GORE-TEX GRAFT THIGH;  Surgeon: [name], MD;  Location: [facility] OR;  Service: Vascular;  Laterality: Right;  Insertion right femoral arteriovenous gortex graft  . Ligation of right braciocephalic av fistula  04-04-2012  . Insertion of dialysis catheter  06/05/2012    Procedure: INSERTION OF DIALYSIS CATHETER;  Surgeon: Pryor Ochoa, MD;  Location: [facility] OR;  Service: Vascular;  Laterality: Left;  Insertion diatek catheter left IJ  . Insertion of dialysis catheter  08/22/2012    Procedure: INSERTION OF DIALYSIS CATHETER;  Surgeon: Nada Libman, MD;  Location: [facility];  Service: Vascular;  Laterality: Left;  insertion of dialysis catheter left  internal jugular 27cm  . Av fistula placement  10/08/2012    Procedure: INSERTION OF  ARTERIOVENOUS (AV) GORE-TEX GRAFT THIGH;  Surgeon: Chuck Hint, MD;  Location[facility] OR;  Service: Vascular;  Laterality: Left;  . Cystoscopy with biopsy N/A 08/07/2013    Procedure: CYSTOSCOPY WITH BLADDER BIOPSY;  Surgeon: [name], MD;  Location: [facility];  Service: Urology;  Laterality: N/A;  . Fulguration of bladder tumor N/A 08/07/2013    Procedure: FULGURATION OF BLADDER TUMOR;  Surgeon: [name], MD;  Location: [facility];  Service: Urology;  Laterality: N/A;   Family History  Problem Relation Age of Onset  . Colon cancer Neg Hx   . Liver disease Neg Hx   . Anesthesia problems Neg Hx   . Heart disease Father   . Other Mother     died @ 79 with respiratory issues following hip fx  .  Diabetes Mother    History  Substance Use Topics  . Smoking status: Former Smoker -- 1.00 packs/day for 25 years    Types: Cigarettes    Quit date: 02/26/1984  . Smokeless tobacco: Former Neurosurgeon     Comment: quit 1985  . Alcohol Use: No    Review of Systems  Constitutional: Positive for fatigue. Negative for fever, activity change and appetite change.  HENT: Negative for congestion, sore throat, facial swelling, rhinorrhea and trouble swallowing.   Eyes: Negative for photophobia and pain.  Respiratory: Positive for cough and shortness of breath. Negative for chest tightness.   Cardiovascular: Positive for chest pain. Negative for leg swelling.  Gastrointestinal: Negative for nausea, vomiting, abdominal pain, diarrhea and constipation.  Endocrine: Negative for polydipsia and polyuria.  Genitourinary: Negative for dysuria, urgency, decreased urine volume and difficulty urinating.  Musculoskeletal: Negative for myalgias, back pain and gait problem.  Skin: Positive for pallor. Negative for color change, rash and wound.  Allergic/Immunologic: Negative for immunocompromised state.  Neurological: Negative for dizziness, loss of consciousness, facial asymmetry, speech difficulty, weakness,  numbness and headaches.  Psychiatric/Behavioral: Negative for confusion, decreased concentration and agitation.    Allergies  Review of patient's allergies indicates no known allergies.  Home Medications   Current Outpatient Rx  Name  Route  Sig  Dispense  Refill  . acetaminophen (TYLENOL) 500 MG tablet   Oral   Take 500 mg by mouth every 6 (six) hours as needed. For pain/headache         . albuterol (PROVENTIL HFA;VENTOLIN HFA) 108 (90 BASE) MCG/ACT inhaler   Inhalation   Inhale 2 puffs into the lungs every 6 (six) hours as needed for wheezing or shortness of breath.   1 Inhaler   0   . allopurinol (ZYLOPRIM) 100 MG tablet   Oral   Take 200 mg by mouth at bedtime.          Marland Kitchen aspirin EC 81 MG tablet   Oral   Take 81 mg by mouth at bedtime.          Marland Kitchen atorvastatin (LIPITOR) 10 MG tablet   Oral   Take 10 mg by mouth every morning.          . B Complex-C-Folic Acid (DIALYVITE TABLET) TABS   Oral   Take 1 tablet by mouth at bedtime.          . calcium carbonate (TUMS - DOSED IN MG ELEMENTAL CALCIUM) 500 MG chewable tablet   Oral   Chew 1 tablet by mouth daily as needed for heartburn. For heartburn         . cinacalcet (SENSIPAR) 30 MG tablet   Oral   Take 30 mg by mouth daily with breakfast.          . colchicine (COLCRYS) 0.6 MG tablet   Oral   Take 0.6 mg by mouth 2 (two) times daily as needed. for gout         . docusate sodium (COLACE) 100 MG capsule   Oral   Take 100 mg by mouth daily.         Marland Kitchen gabapentin (NEURONTIN) 100 MG capsule   Oral   Take 100 mg by mouth 2 (two) times daily. Takes 1 in the morning and 2 at night         . lenalidomide (REVLIMID) 5 MG capsule      One po after each dialysis Mon-Wed-Fri   12 capsule   0  Authorization # [id number]. Adult male.  Script to Eb ...   . midodrine (PROAMATINE) 10 MG tablet   Oral   Take 20 mg by mouth 2 (two) times daily.          Marland Kitchen omeprazole (PRILOSEC) 40 MG capsule    Oral   Take 40 mg by mouth 2 (two) times daily. For reflux         . polyethylene glycol powder (MIRALAX) powder   Oral   Take 17 g by mouth at bedtime.          . sevelamer (RENVELA) 800 MG tablet   Oral   Take 2,400 mg by mouth 3 (three) times daily with meals. 4 tabs 3 x daily and 2 with snack          BP 118/53  Pulse 86  Temp(Src) 98 F (36.7 C) (Oral)  Resp 20  SpO2 97% Physical Exam  Constitutional: He is oriented to person, place, and time. He appears well-developed and well-nourished. No distress.  HENT:  Head: Normocephalic and atraumatic.  Mouth/Throat: No oropharyngeal exudate.  Eyes: Pupils are equal, round, and reactive to light.  Neck: Normal range of motion. Neck supple.  Cardiovascular: Normal rate.  An irregularly irregular rhythm present. Exam reveals no gallop and no friction rub.   Murmur heard. Pulmonary/Chest: Effort normal and breath sounds normal. No respiratory distress. He has no wheezes. He has no rales.  Abdominal: Soft. Bowel sounds are normal. He exhibits no distension and no mass. There is no tenderness. There is no rebound and no guarding.  Musculoskeletal: Normal range of motion. He exhibits no edema and no tenderness.  Neurological: He is alert and oriented to person, place, and time.  Skin: Skin is warm and dry. There is pallor.  Psychiatric: He has a normal mood and affect.    ED Course  Procedures (including critical care time) Labs Review Labs Reviewed  CBC WITH DIFFERENTIAL - Abnormal; Notable for the following:    WBC 1.0 (*)    RBC 2.05 (*)    Hemoglobin 7.0 (*)    HCT 20.9 (*)    MCV 102.0 (*)    MCH 34.1 (*)    Platelets 33 (*)    Neutrophils Relative % 18 (*)    Lymphocytes Relative 68 (*)    Neutro Abs 0.2 (*)    All other components within normal limits  BASIC METABOLIC PANEL - Abnormal; Notable for the following:    Potassium 3.4 (*)    CO2 37 (*)    Creatinine, Ser 1.74 (*)    Calcium 8.0 (*)    GFR calc  non Af Amer 34 (*)    GFR calc Af Amer 39 (*)    All other components within normal limits  TROPONIN I  TYPE AND SCREEN  PREPARE RBC (CROSSMATCH)   Imaging Review Dg Chest 2 View  08/13/2013   CLINICAL DATA:  Chest pain, shortness of breast.  EXAM: CHEST  2 VIEW  COMPARISON:  08/07/2013  FINDINGS: Previous CABG. Surgical clips in the right upper abdomen. Metallic stent in the right subclavian region as before. Lungs clear. Heart size upper limits normal. No effusion. Atheromatous aorta.  IMPRESSION: No acute cardiopulmonary disease.  Stable postoperative changes as above.   Electronically Signed   By: Oley Balm M.D.   On: 08/13/2013 19:48     Date: 08/13/2013  Rate: 64  Rhythm: Sinus with PVCs & PACs  QRS Axis: normal  Intervals: normal  ST/T Wave abnormalities: nonspecific T wave changes  Conduction Disutrbances:borderline QT prolongation  Narrative Interpretation:   Old EKG Reviewed: changes noted resolved TWI in lead III seen on prior.    MDM   1. Chest pain   2. Anemia    Pt is a 79 y.o. male with Pmhx as above who presents with several days worsening fatigue, SOB, and intermittent mid sternal chest tightness c/w prior episodes of anemia. Pt states Hb at HD today 7.1 and was sent after treatment.  Denies CP currently, has chronic systolic murmur, lungs clear, minimal LE edema. Pale. W/U showed EKG as above, negative trop, WBC 1, Hb 7, plts 33.  CXR unremarkable.  Symptoms may be due to anemia, but given age, hx of CABG, I believe pt needs admission for ACS r/o along with transfusion of 1U PRBs.  1. Chest pain   2. Anemia         Shanna Cisco, MD 08/13/13 2142

## 2013-08-13 NOTE — ED Notes (Signed)
Pt denies any itching. No signs of infection at this time.

## 2013-08-13 NOTE — ED Notes (Signed)
Pt to ED after dialysis today.  Pt c/o shortness of breath on exertion.  Also st's he was told his hgb was low.  Pt speaking in full sentences family at bedside.  Pt alert and oriented x's 3, skin warm and dry, color appropriate.

## 2013-08-13 NOTE — ED Notes (Signed)
Pt given a turkey sandwich

## 2013-08-13 NOTE — ED Notes (Signed)
Pt in xray

## 2013-08-13 NOTE — ED Notes (Signed)
The pt was sent from dialysis today.  He has anemia and he was sent here for that.  He is also c/o his legs and feet numbness.  Dialyzed today.  Very hard of hearing

## 2013-08-13 NOTE — ED Notes (Addendum)
Family states pt has a hx of pantocytopenia. Pt is currently taking a new medication to help stimulate RBC and WBC production, pt was told it would take two months to find out if the medication would work, pt has been taking the medication for one month. Pt dialyzes on MWF. Pt has someone come and help him at home M-F and someone who drives him to and from dialysis.

## 2013-08-14 DIAGNOSIS — D61818 Other pancytopenia: Secondary | ICD-10-CM

## 2013-08-14 DIAGNOSIS — D46C Myelodysplastic syndrome with isolated del(5q) chromosomal abnormality: Principal | ICD-10-CM

## 2013-08-14 DIAGNOSIS — R079 Chest pain, unspecified: Secondary | ICD-10-CM

## 2013-08-14 LAB — BASIC METABOLIC PANEL
BUN: 21 mg/dL (ref 6–23)
GFR calc non Af Amer: 18 mL/min — ABNORMAL LOW (ref >90)
Glucose, Bld: 91 mg/dL (ref 70–99)
Potassium: 3.9 mEq/L (ref 3.5–5.1)

## 2013-08-14 LAB — CBC
HCT: 28.2 % — ABNORMAL LOW (ref 39.0–52.0)
Hemoglobin: 9.7 g/dL — ABNORMAL LOW (ref 13.0–17.0)
MCH: 32.9 pg (ref 26.0–34.0)
MCHC: 34.4 g/dL (ref 30.0–36.0)
MCV: 95.6 fL (ref 78.0–100.0)

## 2013-08-14 NOTE — Plan of Care (Signed)
Problem: Phase I Progression Outcomes Goal: Hemodynamically stable Outcome: Progressing Pt received 2 units PRBC Goal: Aspirin unless contraindicated Outcome: Completed/Met Date Met:  08/14/13 Pt given 324 mg of ASA in ED

## 2013-08-14 NOTE — Progress Notes (Signed)
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 Please see my recent hospital consultation note dated July 31 and office notes dated August 19 for additional details. He was recently reevaluated by our group and I did a bone marrow biopsy on August 5. Findings compatible with underlying myelodysplastic syndrome. Despite hematologic features of the 5Q minus syndrome, cytogenetic studies did not support this. Since an approximate 30% response rate can be seen even in the absence of the chromosome 5 deletion with Revlimid, he was started on a trial of this immunomodulatory drug the last week of August at a dose of 5 mg 3 times weekly after dialysis. He continues on Aranesp injections as an additional therapy to stimulate bone marrow production.  He has required intermittent transfusion support for over year. He presented yesterday with increasing dyspnea with findings of a fall in his hemoglobin down to 7 g. In addition to anemia, he is chronically pancytopenic due to the myelodysplastic syndrome. It now appears that his platelet count is falling from intermittent normal values at the beginning of this month to current value of 34,000. White count yesterday 1,000 with 18% neutrophils.  He had an episode of chest discomfort yesterday which he interpreted as heartburn relieved by TUMS. He was transfused on admission and is asymptomatic this morning.  Exam: Blood pressure 118/53, pulse 86 regular, respirations 20, he is afebrile, oxygen saturation 97-100% Lungs are clear Regular cardiac rhythm with 2/6 aortic systolic murmur also heard at the left sternal border Abdomen soft and nontender Extremities no edema  Impression: Pancytopenia secondary to underlying myelodysplastic syndrome complicated by the anemia of end-stage  renal disease.  Unfortunately, a brief trial of Revlimid has resulted in a deterioration instead of an improvement in his blood counts. It is always hard to know whether the deterioration in his white count and platelet count would have occurred anyway even if this drug was not started.  Recommendation: I think that we should abandon the Revlimid. Continue transfusion support as needed. He has a followup visit in my office on September 30.

## 2013-08-14 NOTE — Progress Notes (Signed)
Utilization review completed. Zyshawn Bohnenkamp, RN, BSN. 

## 2013-08-15 LAB — TYPE AND SCREEN: Unit division: 0

## 2013-08-16 NOTE — Discharge Summary (Signed)
Physician Discharge Summary  Andrew [name] MWU:[id number] DOB: 05/23/1926 DOA: 08/13/2013  PCP: [name]., MD  Admit date: 08/13/2013 Discharge date: 08/14/2013  Time spent: 35 minutes  Recommendations for Outpatient Follow-up:  1. Labs with Dr. Cyndie Chime  Discharge Diagnoses:  Principal Problem:   Anemia Active Problems:   ESRD (end stage renal disease)   CAD (coronary artery disease)   MDS (myelodysplastic syndrome) with 5q deletion   Discharge Condition: improved  Diet recommendation: cardiac  Filed Weights   08/13/13 2234  Weight: 74.9 kg (165 lb 2 oz)    History of present illness:  Andrew [name] is a 79 y.o. male with history of ESRD on hemodialysis, mild dysplastic syndrome recently started on Revlimid, CAD status post CABG, systolic heart failure was referred to the ER patient was found to have low hemoglobin. Patient usually gets dialyzed on Monday Wednesday and Friday but over the last 3 days patient has been getting dialyzed every day. Today patient's nephrologist noticed that patient's hemoglobin was lower than usual and was referred to the ER. Chest x-ray EKG and cardiac markers were unremarkable. Patient's hemoglobin was found to be around 7 with known history of MDS patient at this time has been ordered to transfusion and admitted. Patient states that over the last 10 days patient has been getting easily short of breath with minimal exertion and his chest pain happens at rest also but presently chest pain-free and it is usually retrosternal nonradiating sometimes a burning sensation sometimes pressure-like. Patient denies any fever chills   Hospital Course:  Anemia: -abandon the Revlimid.  - transfusion support as needed.  -- followup visit with Dr. Cyndie Chime on September 30.   Procedures:  2 units PRBC  Consultations:  Heme onc  Discharge Exam: Filed Vitals:   08/14/13 0900  BP: 110/40  Pulse: 63  Temp:   Resp: 16    General: hard of  hearing Cardiovascular: rrr Respiratory: clear anterior  Discharge Instructions      Discharge Orders   Future Appointments Provider Department Dept Phone   08/26/2013 11:15 AM Dava Najjar Idelle [facility] [phone]   08/26/2013 11:45 AM Rana Snare, NP [facility] [phone]   Future Orders Complete By Expires   Discharge instructions  As directed    Comments:     Will get labs at next appointment with Dr. Frankey Poot -if any ginigval bleeding/nose bleeding, return to ER Renal diet   Discharge instructions  As directed    Comments:     HD M/W/F as before   Increase activity slowly  As directed        Medication List    STOP taking these medications       lenalidomide 5 MG capsule  Commonly known as:  REVLIMID      TAKE these medications       acetaminophen 500 MG tablet  Commonly known as:  TYLENOL  Take 500 mg by mouth every 6 (six) hours as needed. For pain/headache     albuterol 108 (90 BASE) MCG/ACT inhaler  Commonly known as:  PROVENTIL HFA;VENTOLIN HFA  Inhale 2 puffs into the lungs every 6 (six) hours as needed for wheezing or shortness of breath.     allopurinol 100 MG tablet  Commonly known as:  ZYLOPRIM  Take 200 mg by mouth at bedtime.     aspirin EC 81 MG tablet  Take 81 mg by mouth at bedtime.  atorvastatin 10 MG tablet  Commonly known as:  LIPITOR  Take 10 mg by mouth every morning.     calcium carbonate 500 MG chewable tablet  Commonly known as:  TUMS - dosed in mg elemental calcium  Chew 1 tablet by mouth daily as needed for heartburn. For heartburn     cinacalcet 30 MG tablet  Commonly known as:  SENSIPAR  Take 30 mg by mouth daily with breakfast.     COLCRYS 0.6 MG tablet  Generic drug:  colchicine  Take 0.6 mg by mouth 2 (two) times daily as needed. for gout     DIALYVITE TABLET Tabs  Take 1 tablet by mouth at bedtime.     docusate sodium 100 MG capsule   Commonly known as:  COLACE  Take 100 mg by mouth daily.     gabapentin 100 MG capsule  Commonly known as:  NEURONTIN  Take 100 mg by mouth 2 (two) times daily. Takes 1 in the morning and 2 at night     midodrine 10 MG tablet  Commonly known as:  PROAMATINE  Take 20 mg by mouth 2 (two) times daily.     MIRALAX powder  Generic drug:  polyethylene glycol powder  Take 17 g by mouth at bedtime.     omeprazole 40 MG capsule  Commonly known as:  PRILOSEC  Take 40 mg by mouth 2 (two) times daily. For reflux     sevelamer carbonate 800 MG tablet  Commonly known as:  RENVELA  Take 2,400 mg by mouth 3 (three) times daily with meals. 4 tabs 3 x daily and 2 with snack       No Known Allergies    The results of significant diagnostics from this hospitalization (including imaging, microbiology, ancillary and laboratory) are listed below for reference.    Significant Diagnostic Studies: Dg Chest 2 View  08/13/2013   CLINICAL DATA:  Chest pain, shortness of breast.  EXAM: CHEST  2 VIEW  COMPARISON:  08/07/2013  FINDINGS: Previous CABG. Surgical clips in the right upper abdomen. Metallic stent in the right subclavian region as before. Lungs clear. Heart size upper limits normal. No effusion. Atheromatous aorta.  IMPRESSION: No acute cardiopulmonary disease.  Stable postoperative changes as above.   Electronically Signed   By: Oley Balm M.D.   On: 08/13/2013 19:48   Dg Chest 2 View  08/07/2013   *RADIOLOGY REPORT*  Clinical Data: Hypertension  CHEST - 2 VIEW  Comparison: June 29, 2013.  Findings: Status post coronary artery bypass graft. Cardiomediastinal silhouette appears normal.  Atherosclerotic calcifications of thoracic aorta are noted.  No pleural effusion or pneumothorax is noted.  No acute pulmonary disease is noted. Surgical clips are noted in left axillary region.  Vascular stent is seen in the right axillary region.  IMPRESSION: No acute cardiopulmonary abnormality seen.    Original Report Authenticated By: Lupita Raider.,  M.D.    Microbiology: No results found for this or any previous visit (from the past 240 hour(s)).   Labs: Basic Metabolic Panel:  Recent Labs Lab 08/13/13 1915 08/14/13 0640  NA 140 135  K 3.4* 3.9  CL 97 95*  CO2 37* 31  GLUCOSE 90 91  BUN 9 21  CREATININE 1.74* 3.00*  CALCIUM 8.0* 8.0*   Liver Function Tests: No results found for this basename: AST, ALT, ALKPHOS, BILITOT, PROT, ALBUMIN,  in the last 168 hours No results found for this basename: LIPASE, AMYLASE,  in the last  168 hours No results found for this basename: AMMONIA,  in the last 168 hours CBC:  Recent Labs Lab 08/13/13 1915 08/14/13 0640  WBC 1.0* 1.5*  NEUTROABS 0.2*  --   HGB 7.0* 9.7*  HCT 20.9* 28.2*  MCV 102.0* 95.6  PLT 33* 34*   Cardiac Enzymes:  Recent Labs Lab 08/13/13 1915 08/14/13 0640 08/14/13 1100  TROPONINI <0.30 <0.30 <0.30   BNP: BNP (last 3 results)  Recent Labs  06/14/13 2309  PROBNP 25066.0*   CBG: No results found for this basename: GLUCAP,  in the last 168 hours     Signed:  Marlin Canary  [facility] 08/16/2013, 11:13 AM

## 2013-08-22 ENCOUNTER — Ambulatory Visit (INDEPENDENT_AMBULATORY_CARE_PROVIDER_SITE_OTHER): Payer: Medicare Other | Admitting: Urology

## 2013-08-22 DIAGNOSIS — Z8551 Personal history of malignant neoplasm of bladder: Secondary | ICD-10-CM

## 2013-08-26 ENCOUNTER — Telehealth: Payer: Self-pay | Admitting: Oncology

## 2013-08-26 ENCOUNTER — Other Ambulatory Visit (HOSPITAL_BASED_OUTPATIENT_CLINIC_OR_DEPARTMENT_OTHER): Payer: Medicare Other | Admitting: Lab

## 2013-08-26 ENCOUNTER — Ambulatory Visit (HOSPITAL_COMMUNITY)
Admission: RE | Admit: 2013-08-26 | Discharge: 2013-08-26 | Disposition: A | Discharge status: Home / Self Care | Payer: Medicare Other | Source: Ambulatory Visit | Attending: Oncology | Admitting: Oncology

## 2013-08-26 ENCOUNTER — Ambulatory Visit: Payer: Medicare Other | Admitting: Lab

## 2013-08-26 ENCOUNTER — Ambulatory Visit (HOSPITAL_BASED_OUTPATIENT_CLINIC_OR_DEPARTMENT_OTHER): Payer: Medicare Other | Admitting: Nurse Practitioner

## 2013-08-26 ENCOUNTER — Ambulatory Visit (HOSPITAL_BASED_OUTPATIENT_CLINIC_OR_DEPARTMENT_OTHER): Payer: Medicare Other

## 2013-08-26 VITALS — BP 126/48 | HR 67 | Temp 97.5°F | Resp 18 | Ht 68.0 in | Wt 165.6 lb

## 2013-08-26 VITALS — BP 135/59 | HR 77 | Temp 96.9°F | Resp 20

## 2013-08-26 DIAGNOSIS — D46C Myelodysplastic syndrome with isolated del(5q) chromosomal abnormality: Secondary | ICD-10-CM

## 2013-08-26 DIAGNOSIS — N186 End stage renal disease: Secondary | ICD-10-CM

## 2013-08-26 DIAGNOSIS — I509 Heart failure, unspecified: Secondary | ICD-10-CM

## 2013-08-26 DIAGNOSIS — D631 Anemia in chronic kidney disease: Secondary | ICD-10-CM

## 2013-08-26 DIAGNOSIS — D649 Anemia, unspecified: Secondary | ICD-10-CM

## 2013-08-26 LAB — CBC & DIFF AND RETIC
BASO%: 0.8 % (ref 0.0–2.0)
EOS%: 3.8 % (ref 0.0–7.0)
HCT: 24 % — ABNORMAL LOW (ref 38.4–49.9)
LYMPH%: 57.1 % — ABNORMAL HIGH (ref 14.0–49.0)
MCH: 31.8 pg (ref 27.2–33.4)
MCHC: 31.7 g/dL — ABNORMAL LOW (ref 32.0–36.0)
NEUT%: 29.1 % — ABNORMAL LOW (ref 39.0–75.0)
RBC: 2.39 10*6/uL — ABNORMAL LOW (ref 4.20–5.82)
Retic Ct Abs: 45.89 10*3/uL (ref 34.80–93.90)
lymph#: 1.5 10*3/uL (ref 0.9–3.3)
nRBC: 0 % (ref 0–0)

## 2013-08-26 LAB — COMPREHENSIVE METABOLIC PANEL (CC13)
BUN: 26.8 mg/dL — ABNORMAL HIGH (ref 7.0–26.0)
CO2: 33 mEq/L — ABNORMAL HIGH (ref 22–29)
Calcium: 8.1 mg/dL — ABNORMAL LOW (ref 8.4–10.4)
Chloride: 97 mEq/L — ABNORMAL LOW (ref 98–109)
Creatinine: 5.3 mg/dL (ref 0.7–1.3)

## 2013-08-26 LAB — PREPARE RBC (CROSSMATCH)

## 2013-08-26 MED ORDER — SODIUM CHLORIDE 0.9 % IV SOLN
250.0000 mL | Freq: Once | INTRAVENOUS | Status: AC
  Administered 2013-08-26: 250 mL via INTRAVENOUS

## 2013-08-26 NOTE — Progress Notes (Addendum)
OFFICE PROGRESS NOTE  Interval history:  Mr. Fauver is an 79 year old man with multiple medical problems including underlying coronary disease, end-stage renal disease on dialysis, multifactorial anemia related to renal disease and an underlying myelodysplastic syndrome. He was started on a trial of Revlimid in late August at a dose of 5 mg 3 times weekly after dialysis.   He was hospitalized on 08/13/2013 with symptomatic anemia. Labs at that time showed a hemoglobin of 7, total white count 1.0 with 18% neutrophils and a platelet count of 33,000. Due to the deterioration in the white count and platelets Dr. Cyndie Chime recommended discontinuing Revlimid and continuing with transfusion support as needed.  He returns today for scheduled followup. He noted initial improvement in his overall condition following the blood transfusion on 08/13/2013. Over the past week he has noted recurrent shortness of breath. He denies chest pain. He has intermittent "indigestion" which is relieved with TUMS. He denies any bleeding. He notes that he bruises easily. No fever. He has an occasional cough. He has pain at the left knee which he relates to arthritis. He has an appointment with orthopedics next week. His appetite is poor. His weight is stable. No rashes. Bowels moving regularly.   Objective: Blood pressure 126/48, pulse 67, temperature 97.5 F (36.4 C), temperature source Oral, resp. rate 18, height 5\' 8"  (1.727 m), weight 165 lb 9.6 oz (75.116 kg).  Elderly gentleman in no acute distress. He is hard of hearing. Oropharynx is without thrush or ulceration. No palpable cervical or supraclavicular lymph nodes. Lungs are clear. Regular cardiac rhythm with 2/6 systolic murmur. Abdomen is soft and nontender. No organomegaly. Legs are without edema. Ecchymoses over the dorsal aspects of both hands and forearms. No skin rash. Alert and oriented. He ambulates with a cane.  Lab Results: Lab Results  Component Value  Date   WBC 2.6* 08/26/2013   HGB 7.6* 08/26/2013   HCT 24.0* 08/26/2013   MCV 100.4* 08/26/2013   PLT 53* 08/26/2013    Chemistry:    Chemistry      Component Value Date/Time   NA 135 08/14/2013 0640   K 3.9 08/14/2013 0640   K 6.0 03/23/2011 0912   CL 95* 08/14/2013 0640   CO2 31 08/14/2013 0640   BUN 21 08/14/2013 0640   CREATININE 3.00* 08/14/2013 0640      Component Value Date/Time   CALCIUM 8.0* 08/14/2013 0640   CALCIUM 7.8* 03/13/2012 0527   ALKPHOS 97 06/27/2013 0540   ALKPHOS 103 03/23/2011 0912   AST 31 06/27/2013 0540   ALT 18 06/27/2013 0540   BILITOT 0.9 06/27/2013 0540       Studies/Results: Dg Chest 2 View  08/13/2013   CLINICAL DATA:  Chest pain, shortness of breast.  EXAM: CHEST  2 VIEW  COMPARISON:  08/07/2013  FINDINGS: Previous CABG. Surgical clips in the right upper abdomen. Metallic stent in the right subclavian region as before. Lungs clear. Heart size upper limits normal. No effusion. Atheromatous aorta.  IMPRESSION: No acute cardiopulmonary disease.  Stable postoperative changes as above.   Electronically Signed   By: Oley Balm M.D.   On: 08/13/2013 19:48   Dg Chest 2 View  08/07/2013   *RADIOLOGY REPORT*  Clinical Data: Hypertension  CHEST - 2 VIEW  Comparison: June 29, 2013.  Findings: Status post coronary artery bypass graft. Cardiomediastinal silhouette appears normal.  Atherosclerotic calcifications of thoracic aorta are noted.  No pleural effusion or pneumothorax is noted.  No acute pulmonary disease  is noted. Surgical clips are noted in left axillary region.  Vascular stent is seen in the right axillary region.  IMPRESSION: No acute cardiopulmonary abnormality seen.   Original Report Authenticated By: Lupita Raider.,  M.D.    Medications: I have reviewed the patient's current medications.  Assessment/Plan:  1. Pancytopenia secondary to underlying MDS, complicated by anemia of end stage renal disease. Brief trial of Revlimid initiated August 2014 with  subsequent discontinuation due to further deterioration in the blood counts. 2. End-stage renal disease on dialysis. 3. Coronary artery disease. 4. Hypertension. 5. Hearing deficit.  Disposition-Mr. Grzywacz was on a brief trial of Revlimid beginning August 2014 and discontinued September 2014 due to deterioration in the blood counts. The white count and platelets are better off of Revlimid. The hemoglobin has drifted back down since the last blood transfusion 12-14 days ago.  Dr. Cyndie Chime recommends weekly blood counts with red cell transfusion support as needed for a hemoglobin of less than 8. We are arranging for one unit of blood to be transfused today in our office. We will confirm with the dialysis center in [place] that he is receiving Aranesp injections.  We will see him in followup in the next 4-6 weeks. He and his daughter understand to contact the office between visits with any problems, questions or concerns.  We will contact Dr. Roanna Banning office to see if the weekly blood counts and red cell transfusions can be done in [place] as he is no longer driving himself. In the meantime he will be scheduled in our office.   Patient seen with Dr. Cyndie Chime.  Lonna Cobb ANP/GNP-BC   Hematology oncology attending: I personally interviewed this patient and spent approximately 20 minutes in direct face-to-face counseling with the patient and daughter. Elderly man with multiple complex medical problems, coronary artery disease, end-stage renal disease on dialysis, recently evaluated for progressive pancytopenia. 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 I felt that he had an approximate 30-50% chance of having an improvement in his blood counts on Revlimid and started a low dose of 5 mg to be taken after each dialysis treatment. Unfortunately, after only 3 weeks of treatment, there was a  significant deterioration in his blood counts rather than improvement. He had a brief hospitalization for symptomatic anemia and required transfusion. I saw him while he was in the hospital. Hemoglobin down to 7 g, white count 1000 with 18% neutrophils, platelet count 33,000. I recommended permanent discontinuation of the Revlimid. Counts have improved since discharge. Hemoglobin up to 9.7 after transfusion in the hospital but has now drifted down to 7.6. Total white count up to 2600 with 29% neutrophils, platelets up to 53,000.  Impression: Multifactorial anemia due to chronic renal insufficiency, chronic disease, and underlying myelodysplastic syndrome  Recommendation: We will get in touch with his nephrologist to make sure that he is still getting erythropoietin stimulating agents at time of dialysis treatments. He should have a blood count done on a weekly basis. I would transfuse him when hemoglobin falls below 8 g in view of his age and cardiac status. It would be optimal to do these transfusions at the time of a dialysis session to avoid transfusion associated cardiac overload (TACO)   Cephas Darby, MD, FACP  Hematology-Oncology

## 2013-08-26 NOTE — Patient Instructions (Addendum)
Blood Transfusion Information WHAT IS A BLOOD TRANSFUSION? A transfusion is the replacement of blood or some of its parts. Blood is made up of multiple cells which provide different functions.  Red blood cells carry oxygen and are used for blood loss replacement.  White blood cells fight against infection.  Platelets control bleeding.  Plasma helps clot blood.  Other blood products are available for specialized needs, such as hemophilia or other clotting disorders. BEFORE THE TRANSFUSION  Who gives blood for transfusions?   You may be able to donate blood to be used at a later date on yourself (autologous donation).  Relatives can be asked to donate blood. This is generally not any safer than if you have received blood from a stranger. The same precautions are taken to ensure safety when a relative's blood is donated.  Healthy volunteers who are fully evaluated to make sure their blood is safe. This is blood bank blood. Transfusion therapy is the safest it has ever been in the practice of medicine. Before blood is taken from a donor, a complete history is taken to make sure that person has no history of diseases nor engages in risky social behavior (examples are intravenous drug use or sexual activity with multiple partners). The donor's travel history is screened to minimize risk of transmitting infections, such as malaria. The donated blood is tested for signs of infectious diseases, such as HIV and hepatitis. The blood is then tested to be sure it is compatible with you in order to minimize the chance of a transfusion reaction. If you or a relative donates blood, this is often done in anticipation of surgery and is not appropriate for emergency situations. It takes many days to process the donated blood. RISKS AND COMPLICATIONS Although transfusion therapy is very safe and saves many lives, the main dangers of transfusion include:   Getting an infectious disease.  Developing a  transfusion reaction. This is an allergic reaction to something in the blood you were given. Every precaution is taken to prevent this. The decision to have a blood transfusion has been considered carefully by your caregiver before blood is given. Blood is not given unless the benefits outweigh the risks. AFTER THE TRANSFUSION  Right after receiving a blood transfusion, you will usually feel much better and more energetic. This is especially true if your red blood cells have gotten low (anemic). The transfusion raises the level of the red blood cells which carry oxygen, and this usually causes an energy increase.  The nurse administering the transfusion will monitor you carefully for complications. HOME CARE INSTRUCTIONS  No special instructions are needed after a transfusion. You may find your energy is better. Speak with your caregiver about any limitations on activity for underlying diseases you may have. SEEK MEDICAL CARE IF:   Your condition is not improving after your transfusion.  You develop redness or irritation at the intravenous (IV) site. SEEK IMMEDIATE MEDICAL CARE IF:  Any of the following symptoms occur over the next 12 hours:  Shaking chills.  You have a temperature by mouth above 102 F (38.9 C), not controlled by medicine.  Chest, back, or muscle pain.  People around you feel you are not acting correctly or are confused.  Shortness of breath or difficulty breathing.  Dizziness and fainting.  You get a rash or develop hives.  You have a decrease in urine output.  Your urine turns a dark color or changes to pink, red, or brown. Any of the following   symptoms occur over the next 10 days:  You have a temperature by mouth above 102 F (38.9 C), not controlled by medicine.  Shortness of breath.  Weakness after normal activity.  The white part of the eye turns yellow (jaundice).  You have a decrease in the amount of urine or are urinating less often.  Your  urine turns a dark color or changes to pink, red, or brown. Document Released: 11/10/2000 Document Revised: 02/05/2012 Document Reviewed: 06/29/2008 Exit[employer] Patient Information 2014 [employer].  

## 2013-08-26 NOTE — Telephone Encounter (Signed)
Gave pt appt for lab and ML on November, lab every week until November

## 2013-08-27 LAB — TYPE AND SCREEN
ABO/RH(D): O POS
Unit division: 0

## 2013-09-02 ENCOUNTER — Other Ambulatory Visit (HOSPITAL_BASED_OUTPATIENT_CLINIC_OR_DEPARTMENT_OTHER): Payer: Medicare Other | Admitting: Lab

## 2013-09-02 ENCOUNTER — Telehealth: Payer: Self-pay | Admitting: *Deleted

## 2013-09-02 DIAGNOSIS — D649 Anemia, unspecified: Secondary | ICD-10-CM

## 2013-09-02 DIAGNOSIS — I509 Heart failure, unspecified: Secondary | ICD-10-CM

## 2013-09-02 DIAGNOSIS — D46C Myelodysplastic syndrome with isolated del(5q) chromosomal abnormality: Secondary | ICD-10-CM

## 2013-09-02 DIAGNOSIS — N186 End stage renal disease: Secondary | ICD-10-CM

## 2013-09-02 LAB — CBC WITH DIFFERENTIAL/PLATELET
Basophils Absolute: 0 10*3/uL (ref 0.0–0.1)
EOS%: 4.6 % (ref 0.0–7.0)
Eosinophils Absolute: 0.1 10*3/uL (ref 0.0–0.5)
HCT: 29.7 % — ABNORMAL LOW (ref 38.4–49.9)
HGB: 9.8 g/dL — ABNORMAL LOW (ref 13.0–17.1)
LYMPH%: 46.1 % (ref 14.0–49.0)
MCH: 33.9 pg — ABNORMAL HIGH (ref 27.2–33.4)
MCV: 102.7 fL — ABNORMAL HIGH (ref 79.3–98.0)
MONO%: 11.5 % (ref 0.0–14.0)
NEUT%: 36.9 % — ABNORMAL LOW (ref 39.0–75.0)
Platelets: 66 10*3/uL — ABNORMAL LOW (ref 140–400)

## 2013-09-02 LAB — HOLD TUBE, BLOOD BANK

## 2013-09-02 NOTE — Telephone Encounter (Signed)
Spoke to pt in lobby; Hgb 9.8. Informed pt no transfusion needed and copy of labs given to pt.  Pt verbalized understanding.

## 2013-09-09 ENCOUNTER — Other Ambulatory Visit (HOSPITAL_BASED_OUTPATIENT_CLINIC_OR_DEPARTMENT_OTHER): Payer: Medicare Other | Admitting: Lab

## 2013-09-09 DIAGNOSIS — D46C Myelodysplastic syndrome with isolated del(5q) chromosomal abnormality: Secondary | ICD-10-CM

## 2013-09-09 DIAGNOSIS — N186 End stage renal disease: Secondary | ICD-10-CM

## 2013-09-09 DIAGNOSIS — D649 Anemia, unspecified: Secondary | ICD-10-CM

## 2013-09-09 DIAGNOSIS — I509 Heart failure, unspecified: Secondary | ICD-10-CM

## 2013-09-09 LAB — CBC WITH DIFFERENTIAL/PLATELET
BASO%: 0.8 % (ref 0.0–2.0)
Basophils Absolute: 0 10*3/uL (ref 0.0–0.1)
EOS%: 5.1 % (ref 0.0–7.0)
Eosinophils Absolute: 0.2 10*3/uL (ref 0.0–0.5)
HCT: 31.4 % — ABNORMAL LOW (ref 38.4–49.9)
HGB: 10.3 g/dL — ABNORMAL LOW (ref 13.0–17.1)
LYMPH%: 44.5 % (ref 14.0–49.0)
MCH: 35.4 pg — ABNORMAL HIGH (ref 27.2–33.4)
MCHC: 32.9 g/dL (ref 32.0–36.0)
MCV: 107.4 fL — ABNORMAL HIGH (ref 79.3–98.0)
MONO#: 0.4 10*3/uL (ref 0.1–0.9)
MONO%: 9.9 % (ref 0.0–14.0)
NEUT#: 1.4 10*3/uL — ABNORMAL LOW (ref 1.5–6.5)
NEUT%: 39.7 % (ref 39.0–75.0)
Platelets: 87 10*3/uL — ABNORMAL LOW (ref 140–400)
RBC: 2.92 10*6/uL — ABNORMAL LOW (ref 4.20–5.82)
RDW: 31.1 % — ABNORMAL HIGH (ref 11.0–14.6)
WBC: 3.5 10*3/uL — ABNORMAL LOW (ref 4.0–10.3)
lymph#: 1.6 10*3/uL (ref 0.9–3.3)

## 2013-09-09 LAB — HOLD TUBE, BLOOD BANK

## 2013-09-16 ENCOUNTER — Other Ambulatory Visit (HOSPITAL_BASED_OUTPATIENT_CLINIC_OR_DEPARTMENT_OTHER): Payer: Medicare Other

## 2013-09-16 DIAGNOSIS — I509 Heart failure, unspecified: Secondary | ICD-10-CM

## 2013-09-16 DIAGNOSIS — N186 End stage renal disease: Secondary | ICD-10-CM

## 2013-09-16 DIAGNOSIS — D46C Myelodysplastic syndrome with isolated del(5q) chromosomal abnormality: Secondary | ICD-10-CM

## 2013-09-16 DIAGNOSIS — D649 Anemia, unspecified: Secondary | ICD-10-CM

## 2013-09-16 LAB — CBC WITH DIFFERENTIAL/PLATELET
BASO%: 0.8 % (ref 0.0–2.0)
Basophils Absolute: 0 10*3/uL (ref 0.0–0.1)
EOS%: 4.9 % (ref 0.0–7.0)
Eosinophils Absolute: 0.2 10*3/uL (ref 0.0–0.5)
HCT: 35.8 % — ABNORMAL LOW (ref 38.4–49.9)
HGB: 11.6 g/dL — ABNORMAL LOW (ref 13.0–17.1)
MCH: 36.1 pg — ABNORMAL HIGH (ref 27.2–33.4)
MCHC: 32.4 g/dL (ref 32.0–36.0)
MCV: 111.4 fL — ABNORMAL HIGH (ref 79.3–98.0)
MONO%: 11.4 % (ref 0.0–14.0)
NEUT%: 39.3 % (ref 39.0–75.0)
WBC: 3.6 10*3/uL — ABNORMAL LOW (ref 4.0–10.3)
lymph#: 1.6 10*3/uL (ref 0.9–3.3)

## 2013-09-16 LAB — HOLD TUBE, BLOOD BANK

## 2013-09-23 ENCOUNTER — Other Ambulatory Visit (HOSPITAL_BASED_OUTPATIENT_CLINIC_OR_DEPARTMENT_OTHER): Payer: Medicare Other | Admitting: Lab

## 2013-09-23 DIAGNOSIS — I509 Heart failure, unspecified: Secondary | ICD-10-CM

## 2013-09-23 DIAGNOSIS — N186 End stage renal disease: Secondary | ICD-10-CM

## 2013-09-23 DIAGNOSIS — D649 Anemia, unspecified: Secondary | ICD-10-CM

## 2013-09-23 DIAGNOSIS — D46C Myelodysplastic syndrome with isolated del(5q) chromosomal abnormality: Secondary | ICD-10-CM

## 2013-09-23 LAB — HOLD TUBE, BLOOD BANK

## 2013-09-23 LAB — CBC WITH DIFFERENTIAL/PLATELET
BASO%: 1.2 % (ref 0.0–2.0)
EOS%: 4.4 % (ref 0.0–7.0)
Eosinophils Absolute: 0.2 10*3/uL (ref 0.0–0.5)
LYMPH%: 34.8 % (ref 14.0–49.0)
MCH: 36 pg — ABNORMAL HIGH (ref 27.2–33.4)
MCHC: 32.3 g/dL (ref 32.0–36.0)
MCV: 111.3 fL — ABNORMAL HIGH (ref 79.3–98.0)
MONO%: 13.1 % (ref 0.0–14.0)
NEUT#: 2.1 10*3/uL (ref 1.5–6.5)
Platelets: 126 10*3/uL — ABNORMAL LOW (ref 140–400)
RBC: 3.52 10*6/uL — ABNORMAL LOW (ref 4.20–5.82)
RDW: 24.8 % — ABNORMAL HIGH (ref 11.0–14.6)

## 2013-09-30 ENCOUNTER — Other Ambulatory Visit (HOSPITAL_BASED_OUTPATIENT_CLINIC_OR_DEPARTMENT_OTHER): Payer: Medicare Other | Admitting: Lab

## 2013-09-30 ENCOUNTER — Telehealth: Payer: Self-pay | Admitting: Oncology

## 2013-09-30 ENCOUNTER — Ambulatory Visit (HOSPITAL_BASED_OUTPATIENT_CLINIC_OR_DEPARTMENT_OTHER): Payer: Medicare Other | Admitting: Nurse Practitioner

## 2013-09-30 VITALS — Ht 68.0 in | Wt 168.9 lb

## 2013-09-30 DIAGNOSIS — I509 Heart failure, unspecified: Secondary | ICD-10-CM

## 2013-09-30 DIAGNOSIS — D46C Myelodysplastic syndrome with isolated del(5q) chromosomal abnormality: Secondary | ICD-10-CM

## 2013-09-30 DIAGNOSIS — D649 Anemia, unspecified: Secondary | ICD-10-CM

## 2013-09-30 DIAGNOSIS — N186 End stage renal disease: Secondary | ICD-10-CM

## 2013-09-30 LAB — CBC WITH DIFFERENTIAL/PLATELET
BASO%: 1.1 % (ref 0.0–2.0)
EOS%: 4.9 % (ref 0.0–7.0)
HCT: 39.2 % (ref 38.4–49.9)
MCH: 35.8 pg — ABNORMAL HIGH (ref 27.2–33.4)
MCHC: 32.2 g/dL (ref 32.0–36.0)
MONO#: 0.7 10*3/uL (ref 0.1–0.9)
NEUT%: 48.6 % (ref 39.0–75.0)
RBC: 3.53 10*6/uL — ABNORMAL LOW (ref 4.20–5.82)
RDW: 22.1 % — ABNORMAL HIGH (ref 11.0–14.6)
WBC: 5.9 10*3/uL (ref 4.0–10.3)
lymph#: 2 10*3/uL (ref 0.9–3.3)

## 2013-09-30 LAB — HOLD TUBE, BLOOD BANK

## 2013-09-30 NOTE — Progress Notes (Signed)
OFFICE PROGRESS NOTE  Interval history:  Andrew [name] returns as scheduled. To review, he is an 79 year old man with multiple medical problems including underlying coronary disease, end-stage renal disease on dialysis, multifactorial anemia related to renal disease and an underlying myelodysplastic syndrome. He was started on a trial of Revlimid in late August at a dose of 5 mg 3 times weekly after dialysis. Labs prior to beginning Revlimid done on 07/02/2013 showed a hemoglobin of 7.2, white count 2.8 and platelet count 108,000. He was hospitalized on 08/13/2013 with symptomatic anemia. Labs at that time showed a hemoglobin of 7, total white count 1.0 with 18% neutrophils and a platelet count of 33,000. Due to the deterioration in the white count and platelets Dr. Cyndie Chime recommended discontinuing Revlimid and continuing with transfusion support as needed.  We have been following labs on a weekly schedule. There has been a consistent trend for improvement in the hemoglobin, white count and platelets.  He is feeling much better. His daughter notes that his appetite and energy level are significantly improved. He is much more active. Shortness of breath is less. He denies chest pain. No bleeding.    Objective: Height 5\' 8"  (1.727 m), weight 168 lb 14.4 oz (76.613 kg).  Oropharynx is without thrush or ulceration. No palpable cervical or supraclavicular lymph nodes. Lungs are clear. No wheezes or rales. Regular cardiac rhythm. Abdomen soft and nontender. No organomegaly. No leg edema. Motor strength 5 over 5. He is hard of hearing. He is alert.  Lab Results: Lab Results  Component Value Date   WBC 5.9 09/30/2013   HGB 12.6* 09/30/2013   HCT 39.2 09/30/2013   MCV 111.0* 09/30/2013   PLT 140 09/30/2013    Chemistry:    Chemistry      Component Value Date/Time   NA 142 08/26/2013 1119   NA 135 08/14/2013 0640   K 4.1 08/26/2013 1119   K 3.9 08/14/2013 0640   K 6.0 03/23/2011 0912   CL 95*  08/14/2013 0640   CO2 33* 08/26/2013 1119   CO2 31 08/14/2013 0640   BUN 26.8* 08/26/2013 1119   BUN 21 08/14/2013 0640   CREATININE 5.3* 08/26/2013 1119   CREATININE 3.00* 08/14/2013 0640      Component Value Date/Time   CALCIUM 8.1* 08/26/2013 1119   CALCIUM 8.0* 08/14/2013 0640   CALCIUM 7.8* 03/13/2012 0527   ALKPHOS 130 08/26/2013 1119   ALKPHOS 97 06/27/2013 0540   ALKPHOS 103 03/23/2011 0912   AST 29 08/26/2013 1119   AST 31 06/27/2013 0540   ALT 23 08/26/2013 1119   ALT 18 06/27/2013 0540   BILITOT 0.63 08/26/2013 1119   BILITOT 0.9 06/27/2013 0540       Studies/Results: No results found.  Medications: I have reviewed the patient's current medications.  Assessment/Plan:  1. Pancytopenia secondary to underlying MDS, complicated by anemia of end stage renal disease. Brief trial of Revlimid initiated August 2014 with subsequent discontinuation due to further deterioration in the blood counts. Consistent trend for improvement in the hemoglobin, white count and platelets over the past 5 weeks. 2. End-stage renal disease on dialysis. 3. Coronary artery disease. 4. Hypertension. 5. Hearing deficit.  Disposition-there has been significant improvement in all blood counts. Question if he is responding to the brief trial of Revlimid. We will continue to follow with routine blood counts adjusting to an every 2 week schedule. He will return for a followup visit in 8 weeks.  Plan reviewed with Dr. Cyndie Chime.  Ned Card ANP/GNP-BC

## 2013-09-30 NOTE — Telephone Encounter (Signed)
gv and printed appt sched and avs for pt for NOV and DEC °

## 2013-10-07 ENCOUNTER — Other Ambulatory Visit: Payer: Medicare Other | Admitting: Lab

## 2013-10-14 ENCOUNTER — Other Ambulatory Visit (HOSPITAL_BASED_OUTPATIENT_CLINIC_OR_DEPARTMENT_OTHER): Payer: Medicare Other

## 2013-10-14 DIAGNOSIS — D61818 Other pancytopenia: Secondary | ICD-10-CM

## 2013-10-14 DIAGNOSIS — D46C Myelodysplastic syndrome with isolated del(5q) chromosomal abnormality: Secondary | ICD-10-CM

## 2013-10-14 LAB — CBC WITH DIFFERENTIAL/PLATELET
EOS%: 6.4 % (ref 0.0–7.0)
Eosinophils Absolute: 0.3 10*3/uL (ref 0.0–0.5)
HGB: 12.7 g/dL — ABNORMAL LOW (ref 13.0–17.1)
MCV: 111.4 fL — ABNORMAL HIGH (ref 79.3–98.0)
MONO#: 0.5 10*3/uL (ref 0.1–0.9)
MONO%: 8.9 % (ref 0.0–14.0)
NEUT#: 2.5 10*3/uL (ref 1.5–6.5)
NEUT%: 46.7 % (ref 39.0–75.0)
Platelets: 91 10*3/uL — ABNORMAL LOW (ref 140–400)
RBC: 3.6 10*6/uL — ABNORMAL LOW (ref 4.20–5.82)
RDW: 17.4 % — ABNORMAL HIGH (ref 11.0–14.6)
lymph#: 2 10*3/uL (ref 0.9–3.3)
nRBC: 0 % (ref 0–0)

## 2013-10-21 ENCOUNTER — Encounter (HOSPITAL_COMMUNITY): Payer: Self-pay

## 2013-10-28 ENCOUNTER — Other Ambulatory Visit (HOSPITAL_BASED_OUTPATIENT_CLINIC_OR_DEPARTMENT_OTHER): Payer: Medicare Other | Admitting: Lab

## 2013-10-28 DIAGNOSIS — D46C Myelodysplastic syndrome with isolated del(5q) chromosomal abnormality: Secondary | ICD-10-CM

## 2013-10-28 LAB — CBC WITH DIFFERENTIAL/PLATELET
BASO%: 0.8 % (ref 0.0–2.0)
EOS%: 3.6 % (ref 0.0–7.0)
Eosinophils Absolute: 0.2 10*3/uL (ref 0.0–0.5)
LYMPH%: 31.2 % (ref 14.0–49.0)
MCHC: 32.5 g/dL (ref 32.0–36.0)
MONO#: 0.7 10*3/uL (ref 0.1–0.9)
NEUT#: 3.3 10*3/uL (ref 1.5–6.5)
RBC: 3.4 10*6/uL — ABNORMAL LOW (ref 4.20–5.82)
WBC: 6.3 10*3/uL (ref 4.0–10.3)
lymph#: 2 10*3/uL (ref 0.9–3.3)

## 2013-11-07 ENCOUNTER — Ambulatory Visit (INDEPENDENT_AMBULATORY_CARE_PROVIDER_SITE_OTHER): Payer: Medicare Other | Admitting: Urology

## 2013-11-07 DIAGNOSIS — C670 Malignant neoplasm of trigone of bladder: Secondary | ICD-10-CM

## 2013-11-07 DIAGNOSIS — Z8551 Personal history of malignant neoplasm of bladder: Secondary | ICD-10-CM

## 2013-11-11 ENCOUNTER — Other Ambulatory Visit (HOSPITAL_BASED_OUTPATIENT_CLINIC_OR_DEPARTMENT_OTHER): Payer: Medicare Other

## 2013-11-11 DIAGNOSIS — D46C Myelodysplastic syndrome with isolated del(5q) chromosomal abnormality: Secondary | ICD-10-CM

## 2013-11-11 LAB — CBC WITH DIFFERENTIAL/PLATELET
BASO%: 0.6 % (ref 0.0–2.0)
Basophils Absolute: 0 10*3/uL (ref 0.0–0.1)
EOS%: 3.7 % (ref 0.0–7.0)
Eosinophils Absolute: 0.2 10*3/uL (ref 0.0–0.5)
HCT: 37.1 % — ABNORMAL LOW (ref 38.4–49.9)
HGB: 12 g/dL — ABNORMAL LOW (ref 13.0–17.1)
MCHC: 32.2 g/dL (ref 32.0–36.0)
MONO#: 0.6 10*3/uL (ref 0.1–0.9)
NEUT#: 2.5 10*3/uL (ref 1.5–6.5)
NEUT%: 50.8 % (ref 39.0–75.0)
RDW: 16.8 % — ABNORMAL HIGH (ref 11.0–14.6)
WBC: 4.9 10*3/uL (ref 4.0–10.3)
lymph#: 1.6 10*3/uL (ref 0.9–3.3)

## 2013-11-25 ENCOUNTER — Telehealth: Payer: Self-pay | Admitting: *Deleted

## 2013-11-25 ENCOUNTER — Ambulatory Visit (HOSPITAL_BASED_OUTPATIENT_CLINIC_OR_DEPARTMENT_OTHER): Payer: Medicare Other | Admitting: Oncology

## 2013-11-25 ENCOUNTER — Other Ambulatory Visit (HOSPITAL_BASED_OUTPATIENT_CLINIC_OR_DEPARTMENT_OTHER): Payer: Medicare Other

## 2013-11-25 VITALS — BP 151/71 | HR 66 | Temp 97.0°F | Resp 17 | Ht 68.0 in | Wt 173.5 lb

## 2013-11-25 DIAGNOSIS — D46C Myelodysplastic syndrome with isolated del(5q) chromosomal abnormality: Secondary | ICD-10-CM

## 2013-11-25 DIAGNOSIS — D649 Anemia, unspecified: Secondary | ICD-10-CM

## 2013-11-25 DIAGNOSIS — N186 End stage renal disease: Secondary | ICD-10-CM

## 2013-11-25 DIAGNOSIS — Z992 Dependence on renal dialysis: Secondary | ICD-10-CM

## 2013-11-25 LAB — CBC WITH DIFFERENTIAL/PLATELET
Basophils Absolute: 0 10*3/uL (ref 0.0–0.1)
EOS%: 4.1 % (ref 0.0–7.0)
Eosinophils Absolute: 0.2 10*3/uL (ref 0.0–0.5)
HCT: 39.9 % (ref 38.4–49.9)
HGB: 13 g/dL (ref 13.0–17.1)
LYMPH%: 26.5 % (ref 14.0–49.0)
MCH: 35.5 pg — ABNORMAL HIGH (ref 27.2–33.4)
MCV: 108.6 fL — ABNORMAL HIGH (ref 79.3–98.0)
MONO%: 9.6 % (ref 0.0–14.0)
NEUT%: 59 % (ref 39.0–75.0)
Platelets: 104 10*3/uL — ABNORMAL LOW (ref 140–400)
WBC: 5.8 10*3/uL (ref 4.0–10.3)
lymph#: 1.5 10*3/uL (ref 0.9–3.3)

## 2013-11-25 NOTE — Progress Notes (Signed)
Hematology and Oncology Follow Up Visit  Andrew [name] [id number] 1926/08/03 79 y.o. 11/25/2013 6:11 PM   Principle Diagnosis: Encounter Diagnoses  Name Primary?  . ANEMIA   . 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 Hematologic profile suggested 5Q minus syndrome. He agreed to a bone marrow biopsy which I did on August 5. Findings were consistent with a myelodysplastic syndrome but the 5Q minus deletion was not seen on FISH studies. I still do would be reasonable to give him a trial of Revlimid. I started at a low dose of 5 mg 3 times weekly after each dialysis procedure. He was only on the drug for about 2 weeks when he was readmitted to the hospital on September 11 with profound pancytopenia: Hemoglobin 27 g, white count 1000, platelets 33,000. Revlimid was stopped. I thought that he was a treatment failure. However, a very unusual thing has occurred. His blood counts have steadily improved. He has not required any additional transfusions since the September hospitalization and his hemoglobin has steadily come up to today's value of 13 g. His white blood count has normalized. His platelet count has recovered to over 100,000. I must conclude that he has had a response to the Revlimid despite the short duration of treatment. This is quite remarkable. I have no other explanation for the study improvement in his blood counts. Along with stabilization of his blood counts, his overall performance status has also improved according to his daughter who  accompanies him today.   Medications: reviewed  Allergies: No Known Allergies  Review of Systems: Hematology:  See above ENT ROS:  Breast ROS:  Respiratory ROS: No cough or dyspnea Cardiovascular ROS:   No chest pain or palpitations Gastrointestinal ROS:  No abdominal pain no change Genito-Urinary ROS: Not questioned Musculoskeletal ROS: Chronic arthritis pain Neurological ROS: No headache or change in vision. Chronic decreased hearing. Dermatological ROS: No rash Remaining ROS negative.  Physical Exam: Blood pressure 151/71, pulse 66, temperature 97 F (36.1 C), temperature source Oral, resp. rate 17, height 5\' 8"  (1.727 m), weight 173 lb 8 oz (78.699 kg), SpO2 99.00%. Wt Readings from Last 3 Encounters:  11/25/13 173 lb 8 oz (78.699 kg)  09/30/13 168 lb 14.4 oz (76.613 kg)  08/26/13 165 lb 9.6 oz (75.116 kg)     General appearance: Well-nourished Caucasian man HENNT: Bilateral hearing aids. Pharynx no erythema, exudate, mass, or ulcer. No thyromegaly or thyroid nodules Lymph nodes: No cervical, supraclavicular, or axillary lymphadenopathy Breasts: No abnormal skin changes, no dominant mass in either breast Lungs: Clear to auscultation, resonant to percussion throughout Heart: Regular rhythm, no murmur, no gallop, no rub, no click, no edema Abdomen: Soft, nontender, normal bowel sounds, no mass, no organomegaly Extremities: No edema, no calf tenderness Musculoskeletal: no joint deformities GU: Vascular: Carotid pulses 2+, no bruits,  Neurologic: Alert, oriented, PERRLA,   cranial nerves grossly normal except for bilateral hearing deficits, motor strength 5 over 5, reflexes 1+ symmetric, upper body coordination normal, gait normal, Skin: Scattered ecchymosis on his hands  Lab Results: CBC W/Diff    Component Value Date/Time  WBC 5.8 11/25/2013 1452   WBC 1.5* 08/14/2013 0640   RBC 3.67* 11/25/2013 1452   RBC 2.95* 08/14/2013 0640   RBC 2.87* 12/24/2012 1710   HGB  13.0 11/25/2013 1452   HGB 9.7* 08/14/2013 0640   HCT 39.9 11/25/2013 1452   HCT 28.2* 08/14/2013 0640   PLT 104* 11/25/2013 1452   PLT 34* 08/14/2013 0640   MCV 108.6* 11/25/2013 1452   MCV 95.6 08/14/2013 0640   MCV 102 03/23/2011 0909   MCH 35.5* 11/25/2013 1452   MCH 32.9 08/14/2013 0640   MCHC 32.7 11/25/2013 1452   MCHC 34.4 08/14/2013 0640   RDW 16.6* 11/25/2013 1452   RDW 25.1* 08/14/2013 0640   LYMPHSABS 1.5 11/25/2013 1452   LYMPHSABS 0.7 08/13/2013 1915   MONOABS 0.6 11/25/2013 1452   MONOABS 0.1 08/13/2013 1915   EOSABS 0.2 11/25/2013 1452   EOSABS 0.0 08/13/2013 1915   BASOSABS 0.0 11/25/2013 1452   BASOSABS 0.0 08/13/2013 1915     Chemistry      Component Value Date/Time   NA 142 08/26/2013 1119   NA 135 08/14/2013 0640   K 4.1 08/26/2013 1119   K 3.9 08/14/2013 0640   K 6.0 03/23/2011 0912   CL 95* 08/14/2013 0640   CO2 33* 08/26/2013 1119   CO2 31 08/14/2013 0640   BUN 26.8* 08/26/2013 1119   BUN 21 08/14/2013 0640   CREATININE 5.3* 08/26/2013 1119   CREATININE 3.00* 08/14/2013 0640      Component Value Date/Time   CALCIUM 8.1* 08/26/2013 1119   CALCIUM 8.0* 08/14/2013 0640   CALCIUM 7.8* 03/13/2012 0527   ALKPHOS 130 08/26/2013 1119   ALKPHOS 97 06/27/2013 0540   ALKPHOS 103 03/23/2011 0912   AST 29 08/26/2013 1119   AST 31 06/27/2013 0540   ALT 23 08/26/2013 1119   ALT 18 06/27/2013 0540   BILITOT 0.63 08/26/2013 1119   BILITOT 0.9 06/27/2013 0540       Impression:  #1. Low-risk myelodysplastic syndrome See discussion above. I believe he actually had a response to a brief trial of Revlimid. Since blood counts continue to improve, I will continue observation alone. Decrease frequency of blood counts to every month. Consider resuming Revlimid in the future.  #2. Multifactorial anemia including MDS and end-stage renal disease.  #3. End stage renal disease on dialysis  #4. Essential hypertension.  #5. Coronary artery disease  #6. Sensorineural hearing deficit and a former  pilot  #7. Peripheral vascular disease  #8. Status post nephrectomy for kidney cancer 2001.  #9. Status post TURB January 2013 or superficial noninvasive bladder cancer  #10. Gout  #11. GERD/remote duodenal ulcer     CC: Patient Care Team: Elfredia Nevins, MD as PCP - General (Internal Medicine) Corbin Ade, MD (Gastroenterology) Kathlen Brunswick, MD (Cardiology) Zada Girt, MD (Nephrology) Levert Feinstein, MD as Consulting Physician (Oncology) Lauris Poag, MD as Consulting Physician (Nephrology) Kathlen Brunswick, MD as Consulting Physician (Cardiology) Ellouise Newer, PA-C as Physician Assistant (Physician Assistant)   Levert Feinstein, MD 12/30/20146:11 PM

## 2013-11-25 NOTE — Telephone Encounter (Signed)
appts made and printed...td 

## 2013-12-03 ENCOUNTER — Other Ambulatory Visit (HOSPITAL_COMMUNITY): Payer: Self-pay | Admitting: *Deleted

## 2013-12-03 DIAGNOSIS — M1712 Unilateral primary osteoarthritis, left knee: Secondary | ICD-10-CM

## 2013-12-04 ENCOUNTER — Ambulatory Visit (HOSPITAL_COMMUNITY)
Admission: RE | Admit: 2013-12-04 | Discharge: 2013-12-04 | Disposition: A | Discharge status: Home / Self Care | Payer: Medicare Other | Source: Ambulatory Visit | Attending: Cardiovascular Disease | Admitting: Cardiovascular Disease

## 2013-12-04 DIAGNOSIS — R29898 Other symptoms and signs involving the musculoskeletal system: Secondary | ICD-10-CM

## 2013-12-04 DIAGNOSIS — M7989 Other specified soft tissue disorders: Secondary | ICD-10-CM | POA: Insufficient documentation

## 2013-12-04 DIAGNOSIS — M1712 Unilateral primary osteoarthritis, left knee: Secondary | ICD-10-CM

## 2013-12-04 DIAGNOSIS — M79609 Pain in unspecified limb: Secondary | ICD-10-CM

## 2013-12-04 NOTE — Progress Notes (Signed)
Left Lower Ext. Venous Completed. No evidence of DVT.  Andrew [name], BS, RDMS, RVT

## 2014-01-20 ENCOUNTER — Telehealth: Payer: Self-pay | Admitting: Oncology

## 2014-01-20 ENCOUNTER — Ambulatory Visit (HOSPITAL_BASED_OUTPATIENT_CLINIC_OR_DEPARTMENT_OTHER): Payer: Medicare Other | Admitting: Oncology

## 2014-01-20 ENCOUNTER — Other Ambulatory Visit (HOSPITAL_BASED_OUTPATIENT_CLINIC_OR_DEPARTMENT_OTHER): Payer: Medicare Other

## 2014-01-20 VITALS — BP 133/61 | HR 73 | Temp 97.3°F | Resp 18 | Ht 68.0 in | Wt 176.3 lb

## 2014-01-20 DIAGNOSIS — I10 Essential (primary) hypertension: Secondary | ICD-10-CM

## 2014-01-20 DIAGNOSIS — I2510 Atherosclerotic heart disease of native coronary artery without angina pectoris: Secondary | ICD-10-CM

## 2014-01-20 DIAGNOSIS — D649 Anemia, unspecified: Secondary | ICD-10-CM

## 2014-01-20 DIAGNOSIS — N186 End stage renal disease: Secondary | ICD-10-CM

## 2014-01-20 DIAGNOSIS — D46C Myelodysplastic syndrome with isolated del(5q) chromosomal abnormality: Secondary | ICD-10-CM

## 2014-01-20 DIAGNOSIS — Z992 Dependence on renal dialysis: Secondary | ICD-10-CM

## 2014-01-20 LAB — CBC WITH DIFFERENTIAL/PLATELET
BASO%: 0.8 % (ref 0.0–2.0)
Basophils Absolute: 0 10*3/uL (ref 0.0–0.1)
EOS ABS: 0.2 10*3/uL (ref 0.0–0.5)
EOS%: 4.4 % (ref 0.0–7.0)
HEMATOCRIT: 34.6 % — AB (ref 38.4–49.9)
HGB: 11.3 g/dL — ABNORMAL LOW (ref 13.0–17.1)
LYMPH%: 36.5 % (ref 14.0–49.0)
MCH: 35.3 pg — ABNORMAL HIGH (ref 27.2–33.4)
MCHC: 32.7 g/dL (ref 32.0–36.0)
MCV: 108.1 fL — ABNORMAL HIGH (ref 79.3–98.0)
MONO#: 0.7 10*3/uL (ref 0.1–0.9)
MONO%: 12.7 % (ref 0.0–14.0)
NEUT%: 45.6 % (ref 39.0–75.0)
NEUTROS ABS: 2.4 10*3/uL (ref 1.5–6.5)
PLATELETS: 101 10*3/uL — AB (ref 140–400)
RBC: 3.2 10*6/uL — AB (ref 4.20–5.82)
RDW: 16.8 % — ABNORMAL HIGH (ref 11.0–14.6)
WBC: 5.2 10*3/uL (ref 4.0–10.3)
lymph#: 1.9 10*3/uL (ref 0.9–3.3)

## 2014-01-20 LAB — MORPHOLOGY: PLT EST: DECREASED

## 2014-01-20 NOTE — Progress Notes (Signed)
Hematology and Oncology Follow Up Visit  Andrew [name] [id number] 1926-01-12 80 y.o. 01/20/2014 5:33 PM   Principle Diagnosis: Encounter Diagnoses  Name Primary?  . MDS (myelodysplastic syndrome) with 5q deletion Yes  . Anemia   . 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 Hematologic profile suggested 5Q minus syndrome. He agreed to a bone marrow biopsy which I did on August 5. Findings were consistent with a myelodysplastic syndrome but the 5Q minus deletion was not seen on FISH studies. I still thought it would be reasonable to give him a trial of Revlimid. I started at a low dose of 5 mg 3 times weekly after each dialysis procedure. He was only on the drug for about 2 weeks when he was readmitted to the hospital on September 11 with profound pancytopenia: Hemoglobin 7 g, white count 1000, platelets 33,000. Revlimid was stopped. I thought that he was a treatment failure. However, a very unusual thing has occurred. His blood counts have steadily improved. He has not required any additional transfusions since the September hospitalization and his hemoglobin has steadily come up to a peak  value of 13 g recorded on 11/25/2013.Marland Kitchen His white blood count has normalized. His platelet count has recovered to over 100,000. I must conclude that he  had a response to the Revlimid despite the short duration of treatment. This is quite remarkable. I have no other explanation for the steady improvement in his blood counts.  Along with stabilization of his blood counts, his overall performance status has  also improved and he has not required hospitalization since September.  Clinically he continues to do well with no interim medical problems. No recent infections. However his hemoglobin has dropped to 11.3 g today. White count remains normal at 5200 with 46% neutrophils, 37% lymphocytes, 13% monocytes and 4% eosinophils. Platelets are low at 101,000 compared to 104,000 and December.    Medications: reviewed  Allergies: No Known Allergies  Review of Systems: Hematology:  No bleeding or bruising ENT ROS:  Breast ROS:  Respiratory ROS: No cough or dyspnea Cardiovascular ROS:  No chest pain or palpitations Gastrointestinal ROS:   No abdominal pain Genito-Urinary ROS: Not questioned Musculoskeletal ROS: No new bone pain Neurological ROS: No headache or change in vision Dermatological ROS: No rash or ecchymosis Remaining ROS negative:   Physical Exam: Blood pressure 133/61, pulse 73, temperature 97.3 F (36.3 C), temperature source Oral, resp. rate 18, height $RemoveBe'5\' 8"'pBXcwKuLw$  (1.727 m), weight 176 lb 4.8 oz (79.969 kg), SpO2 98.00%. Wt Readings from Last 3 Encounters:  01/20/14 176 lb 4.8 oz (79.969 kg)  11/25/13 173 lb 8 oz (78.699 kg)  09/30/13 168 lb 14.4 oz (76.613 kg)     General appearance: Well-nourished Caucasian man HENNT: Pharynx no erythema, exudate, mass, or ulcer. No thyromegaly or thyroid nodules Lymph nodes: No cervical, supraclavicular, or axillary lymphadenopathy Breasts:  Lungs: Clear to auscultation, resonant to percussion throughout Heart: Regular rhythm, 2/6 aortic systolic murmur, no gallop, no rub, no click, no edema Abdomen: Soft, nontender, normal bowel sounds, no mass, no organomegaly Extremities: No edema, no calf tenderness Musculoskeletal: no joint deformities GU:  Vascular: Carotid pulses 2+, no bruits,  Neurologic: Alert, oriented, bilateral hearing aids PERRLA, cranial nerves grossly normal except for decreased hearing, motor strength 5 over 5, reflexes 1+  symmetric, upper body coordination normal, gait normal, Skin: No rash or ecchymosis  Lab Results: CBC W/Diff    Component Value Date/Time   WBC 5.2 01/20/2014 1440   WBC 1.5* 08/14/2013 0640   RBC 3.20* 01/20/2014 1440   RBC 2.95* 08/14/2013 0640   RBC 2.87* 12/24/2012 1710   HGB 11.3* 01/20/2014 1440   HGB 9.7* 08/14/2013 0640   HCT 34.6* 01/20/2014 1440   HCT 28.2* 08/14/2013 0640   PLT 101* 01/20/2014 1440   PLT 34* 08/14/2013 0640   MCV 108.1* 01/20/2014 1440   MCV 95.6 08/14/2013 0640   MCV 102 03/23/2011 0909   MCH 35.3* 01/20/2014 1440   MCH 32.9 08/14/2013 0640   MCHC 32.7 01/20/2014 1440   MCHC 34.4 08/14/2013 0640   RDW 16.8* 01/20/2014 1440   RDW 25.1* 08/14/2013 0640   LYMPHSABS 1.9 01/20/2014 1440   LYMPHSABS 0.7 08/13/2013 1915   MONOABS 0.7 01/20/2014 1440   MONOABS 0.1 08/13/2013 1915   EOSABS 0.2 01/20/2014 1440   EOSABS 0.0 08/13/2013 1915   BASOSABS 0.0 01/20/2014 1440   BASOSABS 0.0 08/13/2013 1915     Chemistry      Component Value Date/Time   NA 142 08/26/2013 1119   NA 135 08/14/2013 0640   K 4.1 08/26/2013 1119   K 3.9 08/14/2013 0640   K 6.0 03/23/2011 0912   CL 95* 08/14/2013 0640   CO2 33* 08/26/2013 1119   CO2 31 08/14/2013 0640   BUN 26.8* 08/26/2013 1119   BUN 21 08/14/2013 0640   CREATININE 5.3* 08/26/2013 1119   CREATININE 3.00* 08/14/2013 0640      Component Value Date/Time   CALCIUM 8.1* 08/26/2013 1119   CALCIUM 8.0* 08/14/2013 0640   CALCIUM 7.8* 03/13/2012 0527   ALKPHOS 130 08/26/2013 1119   ALKPHOS 97 06/27/2013 0540   ALKPHOS 103 03/23/2011 0912   AST 29 08/26/2013 1119   AST 31 06/27/2013 0540   ALT 23 08/26/2013 1119   ALT 18 06/27/2013 0540   BILITOT 0.63 08/26/2013 1119   BILITOT 0.9 06/27/2013 0540      Impression:  #1. Low-intermediate risk myelodysplastic syndrome but initially transfusion-dependent for red cells See discussion above. I believe he actually had a response to a brief trial of Revlimid.  There has been a fall in his hemoglobin compared with  the December of 2014 value, however, white count and platelets remained stable. I will continue observation alone for now. Continue blood counts  every month. Consider resuming Revlimid in the future.   #2. Multifactorial anemia including MDS and end-stage renal disease.  I assume he is receiving erythropoietin stimulating agent weekly with dialysis.  #3. End stage renal disease on dialysis   #4. Essential hypertension.   #5. Coronary artery disease   #6. 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 Peripheral vascular disease   #8. Status post nephrectomy for kidney cancer 2001.   #9. Status post TURB January 2013 or superficial noninvasive bladder cancer   #10. Gout   #11. GERD/remote duodenal ulcer  I will transition his care to Dr. Alvy Bimler until we have a full-time physician in [place] which is closer to his home.    CC: Patient Care Team: Redmond School, MD as PCP - General (Internal Medicine) Daneil Dolin, MD (Gastroenterology) Cristopher Estimable  Lattie Haw, MD (Cardiology) Geoffry Paradise, MD (Nephrology) Annia Belt, MD as Consulting Physician (Oncology) Estanislado Emms, MD as Consulting Physician (Nephrology) Yehuda Savannah, MD as Consulting Physician (Cardiology) Baird Cancer, PA-C as Physician Assistant (Physician Assistant)   Annia Belt, MD 2/24/20155:33 PM

## 2014-01-20 NOTE — Telephone Encounter (Signed)
gv adn printed aptp sched and avs forpt for March and April

## 2014-02-17 ENCOUNTER — Other Ambulatory Visit (HOSPITAL_BASED_OUTPATIENT_CLINIC_OR_DEPARTMENT_OTHER): Payer: Medicare Other

## 2014-02-17 DIAGNOSIS — D649 Anemia, unspecified: Secondary | ICD-10-CM

## 2014-02-17 DIAGNOSIS — D46C Myelodysplastic syndrome with isolated del(5q) chromosomal abnormality: Secondary | ICD-10-CM

## 2014-02-17 LAB — CBC WITH DIFFERENTIAL/PLATELET
BASO%: 0.6 % (ref 0.0–2.0)
Basophils Absolute: 0 10*3/uL (ref 0.0–0.1)
EOS ABS: 0.2 10*3/uL (ref 0.0–0.5)
EOS%: 4.8 % (ref 0.0–7.0)
HCT: 37 % — ABNORMAL LOW (ref 38.4–49.9)
HGB: 12.1 g/dL — ABNORMAL LOW (ref 13.0–17.1)
LYMPH#: 1.7 10*3/uL (ref 0.9–3.3)
LYMPH%: 35.4 % (ref 14.0–49.0)
MCH: 35.3 pg — ABNORMAL HIGH (ref 27.2–33.4)
MCHC: 32.9 g/dL (ref 32.0–36.0)
MCV: 107.5 fL — ABNORMAL HIGH (ref 79.3–98.0)
MONO#: 0.6 10*3/uL (ref 0.1–0.9)
MONO%: 12.1 % (ref 0.0–14.0)
NEUT%: 47.1 % (ref 39.0–75.0)
NEUTROS ABS: 2.3 10*3/uL (ref 1.5–6.5)
PLATELETS: 105 10*3/uL — AB (ref 140–400)
RBC: 3.44 10*6/uL — ABNORMAL LOW (ref 4.20–5.82)
RDW: 16.3 % — AB (ref 11.0–14.6)
WBC: 4.8 10*3/uL (ref 4.0–10.3)

## 2014-02-17 LAB — MORPHOLOGY: PLT EST: DECREASED

## 2014-02-18 ENCOUNTER — Telehealth: Payer: Self-pay | Admitting: *Deleted

## 2014-02-18 NOTE — Telephone Encounter (Signed)
Message copied by Domenic Schwab on Wed Feb 18, 2014 10:06 AM ------      Message from: Annia Belt      Created: Wed Feb 18, 2014  9:44 AM       Call pt lab good  Have lab change attending to [facility] ------

## 2014-02-18 NOTE — Telephone Encounter (Signed)
Per Dr. Beryle Beams; notified pt that labs are good and confirmed appt with Dr. Alvy Bimler 03/17/14.  Pt verbalized understanding.

## 2014-03-16 ENCOUNTER — Other Ambulatory Visit: Payer: Self-pay | Admitting: Hematology and Oncology

## 2014-03-16 DIAGNOSIS — D46C Myelodysplastic syndrome with isolated del(5q) chromosomal abnormality: Secondary | ICD-10-CM

## 2014-03-17 ENCOUNTER — Telehealth: Payer: Self-pay | Admitting: Hematology and Oncology

## 2014-03-17 ENCOUNTER — Ambulatory Visit (HOSPITAL_BASED_OUTPATIENT_CLINIC_OR_DEPARTMENT_OTHER): Payer: Medicare Other | Admitting: Hematology and Oncology

## 2014-03-17 ENCOUNTER — Other Ambulatory Visit (HOSPITAL_BASED_OUTPATIENT_CLINIC_OR_DEPARTMENT_OTHER): Payer: Medicare Other

## 2014-03-17 ENCOUNTER — Encounter: Payer: Self-pay | Admitting: Hematology and Oncology

## 2014-03-17 VITALS — BP 114/61 | HR 89 | Temp 97.3°F | Resp 18 | Ht 68.0 in | Wt 175.6 lb

## 2014-03-17 DIAGNOSIS — Z85528 Personal history of other malignant neoplasm of kidney: Secondary | ICD-10-CM

## 2014-03-17 DIAGNOSIS — D649 Anemia, unspecified: Secondary | ICD-10-CM

## 2014-03-17 DIAGNOSIS — D46C Myelodysplastic syndrome with isolated del(5q) chromosomal abnormality: Secondary | ICD-10-CM

## 2014-03-17 LAB — HOLD TUBE, BLOOD BANK

## 2014-03-17 LAB — CBC & DIFF AND RETIC
BASO%: 0.3 % (ref 0.0–2.0)
BASOS ABS: 0 10*3/uL (ref 0.0–0.1)
EOS%: 3.9 % (ref 0.0–7.0)
Eosinophils Absolute: 0.1 10*3/uL (ref 0.0–0.5)
HEMATOCRIT: 33.2 % — AB (ref 38.4–49.9)
HEMOGLOBIN: 10.7 g/dL — AB (ref 13.0–17.1)
Immature Retic Fract: 10.9 % — ABNORMAL HIGH (ref 3.00–10.60)
LYMPH%: 40.9 % (ref 14.0–49.0)
MCH: 35.3 pg — AB (ref 27.2–33.4)
MCHC: 32.2 g/dL (ref 32.0–36.0)
MCV: 109.6 fL — ABNORMAL HIGH (ref 79.3–98.0)
MONO#: 0.4 10*3/uL (ref 0.1–0.9)
MONO%: 10.5 % (ref 0.0–14.0)
NEUT#: 1.6 10*3/uL (ref 1.5–6.5)
NEUT%: 44.4 % (ref 39.0–75.0)
PLATELETS: 89 10*3/uL — AB (ref 140–400)
RBC: 3.03 10*6/uL — ABNORMAL LOW (ref 4.20–5.82)
RDW: 15.5 % — ABNORMAL HIGH (ref 11.0–14.6)
Retic %: 1.57 % (ref 0.80–1.80)
Retic Ct Abs: 47.57 10*3/uL (ref 34.80–93.90)
WBC: 3.6 10*3/uL — ABNORMAL LOW (ref 4.0–10.3)
lymph#: 1.5 10*3/uL (ref 0.9–3.3)

## 2014-03-17 LAB — MORPHOLOGY: PLT EST: DECREASED

## 2014-03-17 NOTE — Progress Notes (Signed)
[facility] FOLLOW-UP progress notes  Patient Care Team: Redmond School, MD as PCP - General (Internal Medicine) Daneil Dolin, MD (Gastroenterology) Geoffry Paradise, MD (Nephrology) Estanislado Emms, MD as Consulting Physician (Nephrology) Baird Cancer, PA-C as Physician Assistant (Physician Assistant) Heath Lark, MD as Consulting Physician (Hematology and Oncology)  CHIEF COMPLAINTS/PURPOSE OF VISIT:  Myelodysplastic syndrome with 5Q deletion, anemia due to end-stage renal disease  HISTORY OF PRESENTING ILLNESS:  Andrew [name] 80 y.o. male was transferred to my care after his prior physician has left.  I reviewed the patient's records extensive and collaborated the history with the patient. 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 Hematologic profile suggested 5Q minus syndrome. He agreed to a bone marrow biopsy on August 5. Findings were consistent with a myelodysplastic syndrome but the 5Q minus deletion was not seen on FISH studies. He was started on a trial of Revlimid at a low dose of 5 mg 3 times weekly after each dialysis procedure. He was only on the drug for about 2 weeks when he was readmitted to the hospital on September 11 with profound pancytopenia: Hemoglobin 7 g, white count 1000, platelets 33,000. Revlimid was stopped. His blood counts have steadily improved. He has not required any additional transfusions since the September hospitalization and his hemoglobin has steadily come up to a peak  value of 13 g recorded on 11/25/2013.Marland Kitchen His white blood count has normalized. His platelet count has recovered to over  100,000.  Along with stabilization of his blood counts, his overall performance status has also improved and he has not required hospitalization since September.  He continue on dialysis 3 times a week on Mondays, Wednesdays and Fridays. He has persistent peripheral neuropathy in his feet. His energy level is fair. He bruises easily. The patient denies any recent signs or symptoms of bleeding such as spontaneous epistaxis, hematuria or hematochezia. Denies any recent infection.  MEDICAL HISTORY:  Past Medical History  Diagnosis Date  . ESRD on hemodialysis     Hemodialysis since 2003; Occluded access in both upper extremities and the right lower extremity, current access as of Aug 2014 is L thigh AVG. Gets HD MWF at [facility].     . Duodenal ulcer     remote  . Chronic combined systolic and diastolic CHF (congestive heart failure)     a. 02/2012 Echo: EF 65%, basal infolat AK, mild conc LVH;  b. 06/2013 Echo: EF 40-45%, mild LVH, Gr2 DD, basal inf HK->AK, Mod AS, Mild MR, PASP 73  . Arteriosclerotic cardiovascular disease (ASCVD) 1998    a. [id number]; b. 06/2003 Cath: LM 70d, LAD 70ost, 80p, LCX 70p/176m, RCA 80p/141m, LIMA->LAD patent, VG->OM2->OM3 patent, VG->dRCA patent, EF 55%;  c. 02/2012 Lexi MV: inf/inflat scarring w/ ? minimal peri-infarct ischemia.  . Cerebrovascular disease   . Neuropathy of foot   . Degenerative joint disease   . Gastroesophageal reflux disease   . Impaired hearing     Bilateral; hearing aids relatively ineffective  . Hypertension   . Cholelithiasis 03/2011    Diagnosed incidentally on abdominal ultrasound in 03/2011  . Hepatic steatosis   . Macrocytosis 10/20/2012  . Thrombocytopenia 10/20/2012  . Gallstones   .  Hyperlipidemia   . Secondary hyperparathyroidism   . Peripheral vascular disease   . Gout   . Moderate aortic stenosis     a. 06/2013 Echo: Mod AS, mean grad: 25, peak grad: 57.  . Pulmonary hypertension     a. 06/2013 Echo: PASP  31mmHg.  Marland Kitchen Anemia     Prior blood transfusion  . History of blood transfusion     last infusion 07/31/13  . Complication of anesthesia     daughter reports long episodes confusion after amnesia meds  . History of bladder cancer   . Renal cell carcinoma 2001    s/p right nephrectomy-2001; subsequent ESRD  . 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 Hypotension   . Pancytopenia     SURGICAL HISTORY: Past Surgical History  Procedure Laterality Date  . Right nephrectomy  2001    Renal cell carcinoma  . Colonoscopy  01/24/2011    prominent vascular pattern, suboptimal prep but doable  . Esophagogastroduodenoscopy  01/24/2011    mild erosive reflux esophagitis, bulbar/antral erosions, bx from antrum benign  . Av fistula repair  2008    revision of anastomosis of Right AVF  . Arteriovenous graft placement  2006    Thrombectomy and interposition jump graft revision to higher axillary vein of LUA AVG  . Coronary artery bypass graft  1998    X4 VESSELS  . Multiple cysto/ resection tumor's with bx's  LAST ONE 05-09-2011  . Multiple surg's / interventions for avgg/ fistula right upper arm  LAST REVISION 06-29-2011    (JUNE 3299 [address] VEIN/ [id number] DILATATION ANGIOPLASTY)  . Left ptc left renal artery    . Cardiac catheterization  2004  . Cataract extraction w/ intraocular lens  implant, bilateral    . Cystoscopy w/ retrogrades  12/28/2011    Procedure: CYSTOSCOPY WITH RETROGRADE PYELOGRAM;  Surgeon: Malka So, MD;  [facility];  Service: Urology;  Laterality: Left;  C-ARM   . Transurethral resection of bladder tumor  12/28/2011    Procedure: TRANSURETHRAL RESECTION OF BLADDER TUMOR (TURBT);  Surgeon: Malka So, MD;  Location: [facility];  Service: Urology;  Laterality: N/A;  . Av fistula placement  02/29/2012    Procedure: INSERTION OF ARTERIOVENOUS (AV) GORE-TEX GRAFT THIGH;  Surgeon: Rosetta Posner, MD;  Location: [facility];  Service: Vascular;  Laterality: Right;  Insertion right femoral arteriovenous gortex graft  . Ligation of right braciocephalic av fistula  [id number]  . Insertion of dialysis catheter  06/05/2012    Procedure: INSERTION OF DIALYSIS CATHETER;  Surgeon: Mal Misty, MD;  Location: [facility];  Service: Vascular;  Laterality: Left;  Insertion diatek catheter left IJ  . Insertion of dialysis catheter  08/22/2012    Procedure: INSERTION OF DIALYSIS CATHETER;  Surgeon: Serafina Mitchell, MD;  Location: [facility];  Service: Vascular;  Laterality: Left;  insertion of dialysis catheter left  internal jugular 27cm  . Av fistula placement  10/08/2012    Procedure: INSERTION OF ARTERIOVENOUS (AV) GORE-TEX GRAFT THIGH;  Surgeon: Angelia Mould, MD;  Location: [facility];  Service: Vascular;  Laterality: Left;  . Cystoscopy with biopsy N/A 08/07/2013    Procedure: CYSTOSCOPY WITH BLADDER BIOPSY;  Surgeon: Malka So, MD;  Location: [facility];  Service: Urology;  Laterality: N/A;  . Fulguration of bladder tumor N/A 08/07/2013    Procedure: FULGURATION OF BLADDER TUMOR;  Surgeon: Malka So, MD;  Location: [facility];  Service: Urology;  Laterality: N/A;    SOCIAL HISTORY: History   Social History  . Marital Status: Widowed    Spouse Name: N/A    Number of Children: 3  . Years of Education: N/A   Occupational History  . Retired    Social History Main Topics  . Smoking status: Former Smoker -- 1.00 packs/day for 25 years    Types: Cigarettes    Quit date: 02/26/1984  . Smokeless tobacco: Former Systems developer     Comment: quit 1985  . Alcohol Use: No  . Drug Use: No  . Sexual Activity: Not Currently   Other Topics Concern  . Not on file   Social History Narrative   Lives in [place] by himself.  Family near by.    FAMILY HISTORY: Family History  Problem Relation Age of Onset  . Colon cancer Neg Hx   . Liver disease Neg Hx   . Anesthesia problems Neg Hx   . Heart disease Father   . Other  Mother     died @ 68 with respiratory issues following hip fx  . Diabetes Mother     ALLERGIES:  has No Known Allergies.  MEDICATIONS:  Current Outpatient Prescriptions  Medication Sig Dispense Refill  . acetaminophen (TYLENOL) 500 MG tablet Take 500 mg by mouth every 6 (six) hours as needed. For pain/headache      . albuterol (PROVENTIL HFA;VENTOLIN HFA) 108 (90 BASE) MCG/ACT inhaler Inhale 2 puffs into the lungs every 6 (six) hours as needed for wheezing or shortness of breath.  1 Inhaler  0  . allopurinol (ZYLOPRIM) 100 MG tablet Take 200 mg by mouth at bedtime.       Marland Kitchen aspirin EC 81 MG tablet Take 81 mg by mouth at bedtime.       Marland Kitchen atorvastatin (LIPITOR) 10 MG tablet Take 10 mg by mouth every morning.       . B Complex-C-Folic Acid (DIALYVITE TABLET) TABS Take 1 tablet by mouth at bedtime.       . calcium carbonate (TUMS - DOSED IN MG ELEMENTAL CALCIUM) 500 MG chewable tablet Chew 1 tablet by mouth daily as needed for heartburn. For heartburn      . cinacalcet (SENSIPAR) 30 MG tablet Take 30 mg by mouth daily with breakfast.       . colchicine (COLCRYS) 0.6 MG tablet Take 0.6 mg by mouth 2 (two) times daily as needed. for gout      . docusate sodium (COLACE) 100 MG capsule Take 100 mg by mouth daily.      Marland Kitchen gabapentin (NEURONTIN) 100 MG capsule Take 100 mg by mouth 2 (two) times daily. Takes 1 in the morning and 2 at night      . midodrine (PROAMATINE) 10 MG tablet Take 20 mg by mouth 3 (three) times daily.       Marland Kitchen omeprazole (PRILOSEC) 40 MG capsule Take 40 mg by mouth 2 (two) times daily. For reflux      . polyethylene glycol powder (MIRALAX) powder Take 17 g by mouth at bedtime.       . sevelamer (RENVELA) 800 MG tablet Take 2,400 mg by mouth 3 (three) times daily with meals. 4 tabs 3 x daily and 2 with snack       No current facility-administered medications for this visit.   Facility-Administered Medications Ordered in Other Visits  Medication Dose Route Frequency Provider Last  Rate Last Dose  . methylPREDNISolone sodium succinate (SOLU-MEDROL)  125 mg/2 mL injection 60 mg  60 mg Intravenous Once Sol Blazing, MD        REVIEW OF SYSTEMS:   Constitutional: Denies fevers, chills or abnormal night sweats Eyes: Denies blurriness of vision, double vision or watery eyes Ears, nose, mouth, throat, and face: Denies mucositis or sore throat Respiratory: Denies cough, dyspnea or wheezes Cardiovascular: Denies palpitation, chest discomfort or lower extremity swelling Gastrointestinal:  Denies nausea, heartburn or change in bowel habits Skin: Denies abnormal skin rashes Lymphatics: Denies new lymphadenopathy Behavioral/Psych: Mood is stable, no new changes  All other systems were reviewed with the patient and are negative.  PHYSICAL EXAMINATION: ECOG PERFORMANCE STATUS: 1 - Symptomatic but completely ambulatory  Filed Vitals:   03/17/14 1512  BP: 114/61  Pulse: 89  Temp: 97.3 F (36.3 C)  Resp: 18   Filed Weights   03/17/14 1512  Weight: 175 lb 9.6 oz (79.652 kg)    GENERAL:alert, no distress and comfortable SKIN: Extensive bruising. No petechiae rash EYES: normal, conjunctiva are pink and non-injected, sclera clear OROPHARYNX:no exudate, normal lips, buccal mucosa, and tongue  NECK: supple, thyroid normal size, non-tender, without nodularity LYMPH:  no palpable lymphadenopathy in the cervical, axillary or inguinal LUNGS: clear to auscultation and percussion with normal breathing effort HEART: regular rate & rhythm and no murmurs without lower extremity edema ABDOMEN:abdomen soft, non-tender and normal bowel sounds Musculoskeletal:no cyanosis of digits and no clubbing  PSYCH: alert & oriented x 3 with fluent speech NEURO: no focal motor/sensory deficits  LABORATORY DATA:  I have reviewed the data as listed Lab Results  Component Value Date   WBC 3.6* 03/17/2014   HGB 10.7* 03/17/2014   HCT 33.2* 03/17/2014   MCV 109.6* 03/17/2014   PLT 89* 03/17/2014     Recent Labs  06/26/13 2154 06/27/13 0540  06/30/13 1138 07/02/13 0646 08/07/13 0937 08/13/13 1915 08/14/13 0640 08/26/13 1119  NA 134* 135  < > 132* 138 135 140 135 142  K 5.2* 5.1  < > 5.0 5.0 4.3 3.4* 3.9 4.1  CL 90* 96  < > 99 103 91* 97 95*  --   CO2 32 30  < > 20 24 32 37* 31 33*  GLUCOSE 106* 100*  < > 105* 88 92 90 91 104  BUN 51* 60*  < > 76* 60* 30* 9 21 26.8*  CREATININE 6.17* 7.23*  < > 7.41* 6.72* 5.45* 1.74* 3.00* 5.3*  CALCIUM 8.6 7.9*  < > 6.8* 7.5* 8.2* 8.0* 8.0* 8.1*  GFRNONAA 7* 6*  < > 6* 7* 9* 34* 18*  --   GFRAA 9* 7*  < > 7* 8* 10* 39* 20*  --   PROT 6.9 5.5*  --   --   --   --   --   --  6.4  ALBUMIN 3.5 2.8*  < > 2.2* 2.5*  --   --   --  3.1*  AST 39* 31  --   --   --   --   --   --  29  ALT 20 18  --   --   --   --   --   --  23  ALKPHOS 136* 97  --   --   --   --   --   --  130  BILITOT 0.7 0.9  --   --   --   --   --   --  0.63  < > =  values in this interval not displayed.   ASSESSMENT & PLAN:  #1 myelodysplastic syndrome He is a little bit more pancytopenic. This could be related to progression of his disease versus mild hemodilution. I recommend he has his blood count rechecked next week and have the results faxed to me. If indeed, the patient is getting more pancytopenic, I would consider resuming Revlimid #2 symptomatic anemia He would continue to receive erythropoietin stimulating agents weekly with dialysis #3 remote history of kidney cancer Nothing to suspect clinically that he has recurrence #4 history of superficial noninvasive bladder cancer He will continue to followup here urology with surveillance cystoscopy as indicated.  Orders Placed This Encounter  Procedures  . CBC & Diff and Retic    Standing Status: Future     Number of Occurrences:      Standing Expiration Date: 03/17/2015  . Hold Tube, Blood Bank    Standing Status: Future     Number of Occurrences:      Standing Expiration Date: 03/17/2015    All questions  were answered. The patient knows to call the clinic with any problems, questions or concerns. I spent 40 minutes counseling the patient face to face. The total time spent in the appointment was 55 minutes and more than 50% was on counseling.     Heath Lark, MD 03/17/2014 4:28 PM

## 2014-03-17 NOTE — Telephone Encounter (Signed)
gv adn printed appt sched and avs for pt for June °

## 2014-03-24 ENCOUNTER — Telehealth: Payer: Self-pay | Admitting: *Deleted

## 2014-03-24 NOTE — Telephone Encounter (Signed)
Call from [facility]. They need a "[facility] lab requisition form" to take blood to [facility] for labs tomorrow.  The Rx sent to them will not work.  Called [facility] lab to fax [place] a blank form.  Filled out form and faxed back 2 copies to [facility] at fax [phone] for CBC w/ diff on 4/29 and 5/06.

## 2014-03-24 NOTE — Telephone Encounter (Signed)
Dau left VM states [facility] requires written order for CBC to be drawn with dialysis appt on Wed 4/29. Rx for CBC to be drawn on 4/29 and 5/06 sent to them at fax [phone].  Left VM for dau that rx was faxed.

## 2014-03-26 ENCOUNTER — Encounter: Payer: Self-pay | Admitting: *Deleted

## 2014-03-26 NOTE — Progress Notes (Signed)
CBC received from [facility].  Drawn on 4/29 at [place].  WBC 4.1, Hgb 10.0, Platelets 111.   Forwarded to Dr. Alvy Bimler for review.

## 2014-04-03 ENCOUNTER — Telehealth: Payer: Self-pay | Admitting: *Deleted

## 2014-04-03 NOTE — Telephone Encounter (Signed)
Calling patient daughter, Divonte Senger, per Dr Alvy Bimler, recent labs from 04/01/2014 are looking good, no need to resume treatment of Revlimid at this time. No need to check labs here anymore until next visit in June. Due to no name verification on voicemail, did not leave message with details. Instructed to call us back to discuss.

## 2014-04-06 ENCOUNTER — Telehealth: Payer: Self-pay | Admitting: *Deleted

## 2014-04-06 NOTE — Telephone Encounter (Signed)
Dau left VM this morning returning nurse's call from last week.  Called dau back again and left her a VM informing labs were good last week and no need to resume treatment at this time and no need to check again until pt's next appt here in June.  Please call back again if any questions.

## 2014-04-10 ENCOUNTER — Telehealth: Payer: Self-pay | Admitting: *Deleted

## 2014-04-10 NOTE — Telephone Encounter (Signed)
May 26th at 1045, would that work?

## 2014-04-10 NOTE — Telephone Encounter (Signed)
Dau reports pt's Hgb this week was checked in Dialysis and is 9.7.  His WBC 4.74.  She is concerned it is dropping.  She states pt also not feeling well and is steadily feeling weaker.  She asks if his labwork should be checked again before his next appt w/ Dr. Alvy Bimler on 6/23?

## 2014-04-13 ENCOUNTER — Telehealth: Payer: Self-pay | Admitting: *Deleted

## 2014-04-13 NOTE — Telephone Encounter (Signed)
Informed daughter of appt on 5/26 at 10:15 am for lab and 10:45 am to see Dr. Alvy Bimler.  She verbalized understanding and was appreciative of Dr. Alvy Bimler able to see pt sooner.  POF sent.

## 2014-04-13 NOTE — Telephone Encounter (Signed)
Left VM for dau to return nurse's call regarding appt message she left on Friday about pt's hgb dropping and pt getting weaker.  Dr. Alvy Bimler can see pt on 5/26 if he can make it that day.

## 2014-04-14 ENCOUNTER — Telehealth: Payer: Self-pay | Admitting: Hematology and Oncology

## 2014-04-14 NOTE — Telephone Encounter (Signed)
s.w. pt and advised on May appt....pt ok adn aware °

## 2014-04-21 ENCOUNTER — Other Ambulatory Visit (HOSPITAL_BASED_OUTPATIENT_CLINIC_OR_DEPARTMENT_OTHER): Payer: Medicare Other

## 2014-04-21 ENCOUNTER — Telehealth: Payer: Self-pay | Admitting: *Deleted

## 2014-04-21 ENCOUNTER — Encounter: Payer: Self-pay | Admitting: Hematology and Oncology

## 2014-04-21 ENCOUNTER — Ambulatory Visit (HOSPITAL_COMMUNITY)
Admission: RE | Admit: 2014-04-21 | Discharge: 2014-04-21 | Disposition: A | Discharge status: Home / Self Care | Payer: Medicare Other | Source: Ambulatory Visit | Attending: Hematology and Oncology | Admitting: Hematology and Oncology

## 2014-04-21 ENCOUNTER — Telehealth: Payer: Self-pay | Admitting: Hematology and Oncology

## 2014-04-21 ENCOUNTER — Ambulatory Visit (HOSPITAL_BASED_OUTPATIENT_CLINIC_OR_DEPARTMENT_OTHER): Payer: Medicare Other | Admitting: Hematology and Oncology

## 2014-04-21 VITALS — BP 130/47 | HR 87 | Temp 97.8°F | Resp 18 | Ht 68.0 in | Wt 178.7 lb

## 2014-04-21 DIAGNOSIS — D61818 Other pancytopenia: Secondary | ICD-10-CM

## 2014-04-21 DIAGNOSIS — N186 End stage renal disease: Secondary | ICD-10-CM

## 2014-04-21 DIAGNOSIS — D649 Anemia, unspecified: Secondary | ICD-10-CM

## 2014-04-21 DIAGNOSIS — D46C Myelodysplastic syndrome with isolated del(5q) chromosomal abnormality: Secondary | ICD-10-CM

## 2014-04-21 DIAGNOSIS — D469 Myelodysplastic syndrome, unspecified: Secondary | ICD-10-CM

## 2014-04-21 DIAGNOSIS — R0600 Dyspnea, unspecified: Secondary | ICD-10-CM

## 2014-04-21 DIAGNOSIS — Z85528 Personal history of other malignant neoplasm of kidney: Secondary | ICD-10-CM

## 2014-04-21 DIAGNOSIS — R0602 Shortness of breath: Secondary | ICD-10-CM | POA: Insufficient documentation

## 2014-04-21 DIAGNOSIS — Z8551 Personal history of malignant neoplasm of bladder: Secondary | ICD-10-CM

## 2014-04-21 LAB — CBC & DIFF AND RETIC
BASO%: 0.3 % (ref 0.0–2.0)
BASOS ABS: 0 10*3/uL (ref 0.0–0.1)
EOS ABS: 0.1 10*3/uL (ref 0.0–0.5)
EOS%: 4.2 % (ref 0.0–7.0)
HEMATOCRIT: 27.7 % — AB (ref 38.4–49.9)
HEMOGLOBIN: 9 g/dL — AB (ref 13.0–17.1)
IMMATURE RETIC FRACT: 7.7 % (ref 3.00–10.60)
LYMPH%: 48.5 % (ref 14.0–49.0)
MCH: 36.1 pg — ABNORMAL HIGH (ref 27.2–33.4)
MCHC: 32.5 g/dL (ref 32.0–36.0)
MCV: 111.2 fL — AB (ref 79.3–98.0)
MONO#: 0.3 10*3/uL (ref 0.1–0.9)
MONO%: 7.8 % (ref 0.0–14.0)
NEUT#: 1.3 10*3/uL — ABNORMAL LOW (ref 1.5–6.5)
NEUT%: 39.2 % (ref 39.0–75.0)
Platelets: 97 10*3/uL — ABNORMAL LOW (ref 140–400)
RBC: 2.49 10*6/uL — ABNORMAL LOW (ref 4.20–5.82)
RDW: 15.7 % — AB (ref 11.0–14.6)
RETIC CT ABS: 6.97 10*3/uL — AB (ref 34.80–93.90)
Retic %: <0.38 % — ABNORMAL LOW (ref 0.5–1.6)
WBC: 3.3 10*3/uL — ABNORMAL LOW (ref 4.0–10.3)
lymph#: 1.6 10*3/uL (ref 0.9–3.3)

## 2014-04-21 LAB — HOLD TUBE, BLOOD BANK

## 2014-04-21 NOTE — Telephone Encounter (Signed)
No note

## 2014-04-21 NOTE — Telephone Encounter (Signed)
Spoke with patient, let him know that his cxr was normal. Sob is d/t anemia, will do BM next week.

## 2014-04-21 NOTE — Telephone Encounter (Signed)
gv adn printed appt sched and avs for pt for June °

## 2014-04-21 NOTE — Progress Notes (Signed)
[facility] OFFICE PROGRESS NOTE  Patient Care Team: Elfredia Nevins, MD as PCP - General (Internal Medicine) Corbin Ade, MD (Gastroenterology) Zada Girt, MD (Nephrology) Lauris Poag, MD as Consulting Physician (Nephrology) Ellouise Newer, PA-C as Physician Assistant (Physician Assistant) Artis Delay, MD as Consulting Physician (Hematology and Oncology)  DIAGNOSIS: Myelodysplastic syndrome, anemia chronic disease, end stage renal disease on hemodialysis  SUMMARY OF ONCOLOGIC HISTORY: He has multiple advanced medical problems including underlying coronary disease, end-stage renal disease on dialysis, multifactorial anemia related to renal disease and an underlying myelodysplastic syndrome  During a July 2014 hospitalization, he was transfusion dependent for blood despite Aranesp support. Hematologic profile suggested 5Q minus syndrome. He agreed to a bone marrow biopsy on August 5. Findings were consistent with a myelodysplastic syndrome but the 5Q minus deletion was not seen on FISH studies. He was started on a trial of Revlimid at a low dose of 5 mg 3 times weekly after each dialysis procedure. He was only on the drug for about 2 weeks when he was readmitted to the hospital on September 11 with profound pancytopenia: Hemoglobin 7 g, white count 1000, platelets 33,000. Revlimid was stopped. His blood counts have steadily improved. He has not required any additional transfusions since the September hospitalization and his hemoglobin has steadily come up to a peak  value of 13 g recorded on 11/25/2013.Marland Kitchen His white blood count has normalized. His platelet count has recovered to over 100,000.  He continue on dialysis 3 times a week on Mondays, Wednesdays and Fridays.  INTERVAL HISTORY: Since he was last seen here, he has progression of fatigue. He is short of breath on minimal exertion. The patient denies any recent signs or symptoms of bleeding such as spontaneous  epistaxis, hematuria or hematochezia. He has poor appetite. I have reviewed the past medical history, past surgical history, social history and family history with the patient and they are unchanged from previous note.  ALLERGIES:  has No Known Allergies.  MEDICATIONS:  Current Outpatient Prescriptions  Medication Sig Dispense Refill  . acetaminophen (TYLENOL) 500 MG tablet Take 500 mg by mouth every 6 (six) hours as needed. For pain/headache      . albuterol (PROVENTIL HFA;VENTOLIN HFA) 108 (90 BASE) MCG/ACT inhaler Inhale 2 puffs into the lungs every 6 (six) hours as needed for wheezing or shortness of breath.  1 Inhaler  0  . allopurinol (ZYLOPRIM) 100 MG tablet Take 200 mg by mouth at bedtime.       Marland Kitchen aspirin EC 81 MG tablet Take 81 mg by mouth at bedtime.       Marland Kitchen atorvastatin (LIPITOR) 10 MG tablet Take 10 mg by mouth every morning.       . B Complex-C-Folic Acid (DIALYVITE TABLET) TABS Take 1 tablet by mouth at bedtime.       . calcium carbonate (TUMS - DOSED IN MG ELEMENTAL CALCIUM) 500 MG chewable tablet Chew 1 tablet by mouth daily as needed for heartburn. For heartburn      . cinacalcet (SENSIPAR) 30 MG tablet Take 30 mg by mouth daily with breakfast.       . colchicine (COLCRYS) 0.6 MG tablet Take 0.6 mg by mouth 2 (two) times daily as needed. for gout      . docusate sodium (COLACE) 100 MG capsule Take 100 mg by mouth daily.      Marland Kitchen gabapentin (NEURONTIN) 100 MG capsule Take 100 mg by mouth 2 (two) times daily. Takes  1 in the morning and 2 at night      . midodrine (PROAMATINE) 10 MG tablet Take 20 mg by mouth 3 (three) times daily.       Marland Kitchen omeprazole (PRILOSEC) 40 MG capsule Take 40 mg by mouth 2 (two) times daily. For reflux      . polyethylene glycol powder (MIRALAX) powder Take 17 g by mouth at bedtime.       . sevelamer (RENVELA) 800 MG tablet Take 2,400 mg by mouth 3 (three) times daily with meals. 4 tabs 3 x daily and 2 with snack       No current facility-administered  medications for this visit.   Facility-Administered Medications Ordered in Other Visits  Medication Dose Route Frequency Provider Last Rate Last Dose  . methylPREDNISolone sodium succinate (SOLU-MEDROL) 125 mg/2 mL injection 60 mg  60 mg Intravenous Once Sol Blazing, MD        REVIEW OF SYSTEMS:   Constitutional: Denies fevers, chills or abnormal weight loss Eyes: Denies blurriness of vision Ears, nose, mouth, throat, and face: Denies mucositis or sore throat Gastrointestinal:  Denies nausea, heartburn or change in bowel habits Skin: Denies abnormal skin rashes. He has chronic skin bruising Lymphatics: Denies new lymphadenopathy  Neurological:Denies numbness, tingling or new weaknesses Behavioral/Psych: Mood is stable, no new changes  All other systems were reviewed with the patient and are negative.  PHYSICAL EXAMINATION: ECOG PERFORMANCE STATUS: 2 - Symptomatic, <50% confined to bed  Filed Vitals:   04/21/14 1113  BP: 130/47  Pulse: 87  Temp: 97.8 F (36.6 C)  Resp: 18   Filed Weights   04/21/14 1113  Weight: 178 lb 11.2 oz (81.058 kg)    GENERAL:alert, no distress and comfortable. he looks elderly with no distress SKIN: skin color, texture, turgor are normal, no rashes or significant lesions noted extensive bruising in his hands EYES: normal, Conjunctiva are pale and non-injected, sclera clear OROPHARYNX:no exudate, no erythema and lips, buccal mucosa, and tongue normal  NECK: supple, thyroid normal size, non-tender, without nodularity LYMPH:  no palpable lymphadenopathy in the cervical, axillary or inguinal LUNGS: clear to auscultation and percussion with increased breathing effort HEART: regular rate & rhythm and no murmurs and no lower extremity edema ABDOMEN:abdomen soft, non-tender and normal bowel sounds Musculoskeletal:no cyanosis of digits and no clubbing  NEURO: alert & oriented x 3 with fluent speech, no focal motor/sensory deficits  LABORATORY DATA:   I have reviewed the data as listed    Component Value Date/Time   NA 142 08/26/2013 1119   NA 135 08/14/2013 0640   K 4.1 08/26/2013 1119   K 3.9 08/14/2013 0640   K 6.0 03/23/2011 0912   CL 95* 08/14/2013 0640   CO2 33* 08/26/2013 1119   CO2 31 08/14/2013 0640   GLUCOSE 104 08/26/2013 1119   GLUCOSE 91 08/14/2013 0640   BUN 26.8* 08/26/2013 1119   BUN 21 08/14/2013 0640   CREATININE 5.3* 08/26/2013 1119   CREATININE 3.00* 08/14/2013 0640   CALCIUM 8.1* 08/26/2013 1119   CALCIUM 8.0* 08/14/2013 0640   CALCIUM 7.8* 03/13/2012 0527   PROT 6.4 08/26/2013 1119   PROT 5.5* 06/27/2013 0540   ALBUMIN 3.1* 08/26/2013 1119   ALBUMIN 2.5* 07/02/2013 0646   AST 29 08/26/2013 1119   AST 31 06/27/2013 0540   ALT 23 08/26/2013 1119   ALT 18 06/27/2013 0540   ALKPHOS 130 08/26/2013 1119   ALKPHOS 97 06/27/2013 0540   ALKPHOS 103 03/23/2011 0912  BILITOT 0.63 08/26/2013 1119   BILITOT 0.9 06/27/2013 0540   GFRNONAA 18* 08/14/2013 0640   GFRAA 20* 08/14/2013 0640    No results found for this basename: SPEP, UPEP,  kappa and lambda light chains    Lab Results  Component Value Date   WBC 3.3* 04/21/2014   NEUTROABS 1.3* 04/21/2014   HGB 9.0* 04/21/2014   HCT 27.7* 04/21/2014   MCV 111.2* 04/21/2014   PLT 97* 04/21/2014      Chemistry      Component Value Date/Time   NA 142 08/26/2013 1119   NA 135 08/14/2013 0640   K 4.1 08/26/2013 1119   K 3.9 08/14/2013 0640   K 6.0 03/23/2011 0912   CL 95* 08/14/2013 0640   CO2 33* 08/26/2013 1119   CO2 31 08/14/2013 0640   BUN 26.8* 08/26/2013 1119   BUN 21 08/14/2013 0640   CREATININE 5.3* 08/26/2013 1119   CREATININE 3.00* 08/14/2013 0640      Component Value Date/Time   CALCIUM 8.1* 08/26/2013 1119   CALCIUM 8.0* 08/14/2013 0640   CALCIUM 7.8* 03/13/2012 0527   ALKPHOS 130 08/26/2013 1119   ALKPHOS 97 06/27/2013 0540   ALKPHOS 103 03/23/2011 0912   AST 29 08/26/2013 1119   AST 31 06/27/2013 0540   ALT 23 08/26/2013 1119   ALT 18 06/27/2013 0540   BILITOT 0.63 08/26/2013 1119    BILITOT 0.9 06/27/2013 0540       RADIOGRAPHIC STUDIES: I have personally reviewed the radiological images as listed and agreed with the findings in the report. Dg Chest 2 View  04/21/2014   CLINICAL DATA:  80 year old male with shortness of Breath chest pain cough and congestion. Initial encounter. Myelo dysplastic syndrome.  EXAM: CHEST  2 VIEW  COMPARISON:  08/13/2013 and earlier.  FINDINGS: Stable cardiac size and mediastinal contours. Sequelae of CABG. Calcified atherosclerosis of the aorta. Chronic right subclavian vascular stent. Stable surgical clips left axilla. No pneumothorax, pulmonary edema, pleural effusion or acute pulmonary opacity. Stable visualized osseous structures.  IMPRESSION: No acute cardiopulmonary abnormality.   Electronically Signed   By: Lars Pinks M.D.   On: 04/21/2014 12:41     ASSESSMENT & PLAN:  #1 myelodysplastic syndrome He is a little bit more pancytopenic. This could be related to progression of his disease. I recommend repeat bone marrow aspirate and biopsy. He agrees to proceed. #2 symptomatic anemia He would continue to receive erythropoietin stimulating agents weekly with dialysis. He does not require transfusion. 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 #5 shortness of breath I will order chest x-ray. Result available at the time of dictation which is clear. The cause of his dyspnea is due to anemia. Orders Placed This Encounter  Procedures  . DG Chest 2 View    Standing Status: Future     Number of Occurrences: 1     Standing Expiration Date: 04/21/2015    Order Specific Question:  Reason for exam:    Answer:  dyspnea    Order Specific Question:  Preferred imaging location?    Answer:  [facility]  . CBC with Differential    Standing Status: Future      Number of Occurrences:      Standing Expiration Date: 04/21/2015   All questions were answered. The patient knows to call the clinic with  any problems, questions or concerns. No barriers to learning was detected.   Heath Lark, MD 04/21/2014 8:46 PM

## 2014-04-22 ENCOUNTER — Telehealth: Payer: Self-pay | Admitting: Hematology and Oncology

## 2014-04-22 NOTE — Telephone Encounter (Signed)
cld & spoke w/pt to give pt time &date for appt-pt understood

## 2014-04-22 NOTE — Telephone Encounter (Signed)
per pof to sch appt @11 :30-sent email to [facility] to override-will call pt with time & date for app

## 2014-04-28 ENCOUNTER — Telehealth: Payer: Self-pay | Admitting: *Deleted

## 2014-04-28 ENCOUNTER — Other Ambulatory Visit (HOSPITAL_BASED_OUTPATIENT_CLINIC_OR_DEPARTMENT_OTHER): Payer: Medicare Other

## 2014-04-28 ENCOUNTER — Ambulatory Visit (HOSPITAL_BASED_OUTPATIENT_CLINIC_OR_DEPARTMENT_OTHER): Payer: Medicare Other | Admitting: Hematology and Oncology

## 2014-04-28 ENCOUNTER — Other Ambulatory Visit (HOSPITAL_COMMUNITY)
Admission: RE | Admit: 2014-04-28 | Discharge: 2014-04-28 | Disposition: A | Discharge status: Home / Self Care | Payer: Medicare Other | Source: Ambulatory Visit | Attending: Hematology and Oncology | Admitting: Hematology and Oncology

## 2014-04-28 VITALS — BP 156/65 | HR 88 | Temp 97.0°F | Resp 20

## 2014-04-28 DIAGNOSIS — D46C Myelodysplastic syndrome with isolated del(5q) chromosomal abnormality: Secondary | ICD-10-CM

## 2014-04-28 DIAGNOSIS — D469 Myelodysplastic syndrome, unspecified: Secondary | ICD-10-CM

## 2014-04-28 DIAGNOSIS — R0600 Dyspnea, unspecified: Secondary | ICD-10-CM

## 2014-04-28 DIAGNOSIS — D649 Anemia, unspecified: Secondary | ICD-10-CM

## 2014-04-28 DIAGNOSIS — D61818 Other pancytopenia: Secondary | ICD-10-CM | POA: Insufficient documentation

## 2014-04-28 LAB — CBC WITH DIFFERENTIAL/PLATELET
BASO%: 0.3 % (ref 0.0–2.0)
Basophils Absolute: 0 10*3/uL (ref 0.0–0.1)
EOS%: 6.5 % (ref 0.0–7.0)
Eosinophils Absolute: 0.2 10*3/uL (ref 0.0–0.5)
HCT: 26.5 % — ABNORMAL LOW (ref 38.4–49.9)
HGB: 8.5 g/dL — ABNORMAL LOW (ref 13.0–17.1)
LYMPH%: 44.4 % (ref 14.0–49.0)
MCH: 36 pg — AB (ref 27.2–33.4)
MCHC: 32.1 g/dL (ref 32.0–36.0)
MCV: 112.3 fL — ABNORMAL HIGH (ref 79.3–98.0)
MONO#: 0.2 10*3/uL (ref 0.1–0.9)
MONO%: 8.2 % (ref 0.0–14.0)
NEUT#: 1.2 10*3/uL — ABNORMAL LOW (ref 1.5–6.5)
NEUT%: 40.6 % (ref 39.0–75.0)
PLATELETS: 101 10*3/uL — AB (ref 140–400)
RBC: 2.36 10*6/uL — ABNORMAL LOW (ref 4.20–5.82)
RDW: 16.2 % — ABNORMAL HIGH (ref 11.0–14.6)
WBC: 2.9 10*3/uL — ABNORMAL LOW (ref 4.0–10.3)
lymph#: 1.3 10*3/uL (ref 0.9–3.3)

## 2014-04-28 LAB — BONE MARROW EXAM

## 2014-04-28 NOTE — Progress Notes (Signed)
1225-VS stable at time of discharge.  Pressure dressing intact to mid sacrum with dime size amt of bloody drainage noted, area marked.  BM bx discharge instructions reviewed with patient.  Pt has no questions at this time.

## 2014-04-28 NOTE — Patient Instructions (Signed)
Bone Marrow Aspiration, Bone Marrow Biopsy Care After Read the instructions outlined below and refer to this sheet in the next few weeks. These discharge instructions provide you with general information on caring for yourself after you leave the hospital. Your caregiver may also give you specific instructions. While your treatment has been planned according to the most current medical practices available, unavoidable complications occasionally occur. If you have any problems or questions after discharge, call your caregiver. FINDING OUT THE RESULTS OF YOUR TEST Not all test results are available during your visit. If your test results are not back during the visit, make an appointment with your caregiver to find out the results. Do not assume everything is normal if you have not heard from your caregiver or the medical facility. It is important for you to follow up on all of your test results.  HOME CARE INSTRUCTIONS  You have had sedation and may be sleepy or dizzy. Your thinking may not be as clear as usual. For the next 24 hours:  Only take over-the-counter or prescription medicines for pain, discomfort, and or fever as directed by your caregiver.  Do not drink alcohol.  Do not smoke.  Do not drive.  Do not make important legal decisions.  Do not operate heavy machinery.  Do not care for small children by yourself.  Keep your dressing clean and dry. You may replace dressing with a bandage after 24 hours.  You may take a bath or shower after 24 hours.  Use an ice pack for 20 minutes every 2 hours while awake for pain as needed. SEEK MEDICAL CARE IF:   There is redness, swelling, or increasing pain at the biopsy site.  There is pus coming from the biopsy site.  There is drainage from a biopsy site lasting longer than one day.  An unexplained oral temperature above 102 F (38.9 C) develops. SEEK IMMEDIATE MEDICAL CARE IF:   You develop a rash.  You have difficulty  breathing.  You develop any reaction or side effects to medications given. Document Released: 06/02/2005 Document Revised: 02/05/2012 Document Reviewed: 11/10/2008 [facility] Patient Information 2014 [place]. Bone Marrow Aspiration and Bone Biopsy Examination of the bone marrow is a valuable test to diagnose blood disorders. A bone marrow biopsy takes a sample of bone and a small amount of fluid and cells from inside the bone. A bone marrow aspiration removes only the marrow. Bone marrow aspiration and bone biopsies are used to stage different disorders of the blood, such as leukemia. Staging will help your caregiver understand how far the disease has progressed.  The tests are also useful in diagnosing:  Fever of unknown origin (FUO).  Bacterial infections and other widespread fungal infections.  Cancers that have spread (metastasized) to the bone marrow.  Diseases that are characterized by a deficiency of an enzyme (storage diseases). This includes:  Niemann-Pick disease.  Gaucher disease. PROCEDURE  Sites used to get samples include:   Back of your hip bone (posterior iliac crest).  Both aspiration and biopsy.  Front of your hip bone (anterior iliac crest).  Both aspiration and biopsy.  Breastbone (sternum).  Aspiration from your breastbone (done only in adults). This method is rarely used. When you get a hip bone aspiration:  You are placed lying on your side with the upper knee brought up and flexed with the lower leg straight.  The site is prepared, cleaned with an antiseptic scrub, and draped. This keeps the biopsy area clean.  The  skin and the area down to the lining of the bone (periosteum) are made numb with a local anesthetic.  The bone marrow aspiration needle is inserted. You will feel pressure on your bone.  Once inside the marrow cavity, a sample of bone marrow is sucked out (aspirated) for pathology slides.  The material collected for bone marrow  slides is processed immediately by a technologist.  The technician selects the marrow particles to make the slides for pathology.  The marrow aspiration needle is removed. Then pressure is applied to the site with gauze until bleeding has stopped. Following an aspiration, a bone marrow biopsy may be performed as well. The technique for this is very similar. A dressing is then applied.  RISKS AND COMPLICATIONS  The main complications of a bone marrow aspiration and biopsy include infection and bleeding.  Complications are uncommon. The procedure may not be performed in patients with bleeding tendencies.  A very rare complication from the procedure is injury to the heart during a breastbone (sternal) marrow aspiration. Only bone marrow aspirations are performed in this area.  Long-lasting pain at the site of the bone marrow aspiration and biopsy is uncommon. Your caregiver will let you know when you are to get your results and will discuss them with you. You may make an appointment with your caregiver to find out the results. Do not assume everything is normal if you have not heard from your caregiver or the medical facility. It is important for you to follow up on all of your test results. Document Released: 11/16/2004 Document Revised: 02/05/2012 Document Reviewed: 11/10/2008 [employer] [employer] Patient Information 2014 [place].

## 2014-04-28 NOTE — Telephone Encounter (Signed)
Received call from daughter Amara Manalang wanting to inform Dr. Alvy Bimler re:  Pt developed fever last night with temp  99.5 ;  Pt took Tylenol. This am , temp was  98.0 .  Diane also stated pt had reaction to any meds that have amnesia effect.  Diane just want to let  Dr. Alvy Bimler know before bone marrow biopsy today. Diane's  Phone     [phone].

## 2014-04-28 NOTE — Progress Notes (Signed)
Pt reports fever of 99 last night at home. Patient took tylenol and fever resolved. Denies shaking or chills. No other abnormal complaints today. Dr. Alvy Bimler notified. MD also aware of lab results today prior to BMBX. Bone marrow bx/a performed without difficulty. Patient tolerated well, no complaints; remaining alert and talkative throughout. Pressure dressing applied. Patient being monitored per policy. Visitor at bedside for monitoring period.

## 2014-04-28 NOTE — Progress Notes (Signed)
The patient is here for bone marrow biopsy.

## 2014-04-28 NOTE — Procedures (Signed)
Brief examination was performed. ENT: adequate airway clearance Heart: regular rate and rhythm.No Murmurs Lungs: clear to auscultation, no wheezes, normal respiratory effort Bone Marrow Biopsy and Aspiration Procedure Note   Informed consent was obtained and potential risks including bleeding, infection and pain were reviewed with the patient.  The patient's name, date of birth, identification, consent and allergies were verified prior to the start of procedure and time out was performed. The right posterior iliac crest was chosen as the site of biopsy.  The skin was prepped with Betadine solution.   8 cc of 1% lidocaine was used to provide local anaesthesia.   10 cc of bone marrow aspirate was obtained followed by 1 inch biopsy.   The procedure was tolerated well and there were no complications.  The patient was stable at the end of the procedure.  Specimens sent for flow cytometry, cytogenetics and additional studies.

## 2014-04-29 ENCOUNTER — Encounter (HOSPITAL_COMMUNITY)
Admission: RE | Admit: 2014-04-29 | Discharge: 2014-04-29 | Disposition: A | Discharge status: Home / Self Care | Payer: Medicare Other | Source: Ambulatory Visit | Attending: Nephrology | Admitting: Nephrology

## 2014-04-29 DIAGNOSIS — D631 Anemia in chronic kidney disease: Secondary | ICD-10-CM | POA: Insufficient documentation

## 2014-04-29 DIAGNOSIS — N186 End stage renal disease: Secondary | ICD-10-CM | POA: Insufficient documentation

## 2014-04-29 DIAGNOSIS — N039 Chronic nephritic syndrome with unspecified morphologic changes: Principal | ICD-10-CM

## 2014-04-29 LAB — HEMOGLOBIN AND HEMATOCRIT, BLOOD
HCT: 25.2 % — ABNORMAL LOW (ref 39.0–52.0)
Hemoglobin: 8.5 g/dL — ABNORMAL LOW (ref 13.0–17.0)

## 2014-04-29 LAB — PREPARE RBC (CROSSMATCH)

## 2014-04-30 ENCOUNTER — Encounter (HOSPITAL_COMMUNITY)
Admission: RE | Admit: 2014-04-30 | Discharge: 2014-04-30 | Disposition: A | Discharge status: Home / Self Care | Payer: Medicare Other | Source: Ambulatory Visit | Attending: Nephrology | Admitting: Nephrology

## 2014-04-30 LAB — TYPE AND SCREEN
ABO/RH(D): O POS
Antibody Screen: NEGATIVE
UNIT DIVISION: 0
Unit division: 0

## 2014-04-30 LAB — PREPARE RBC (CROSSMATCH)

## 2014-04-30 MED ORDER — SODIUM CHLORIDE 0.9 % IV SOLN
INTRAVENOUS | Status: DC
  Administered 2014-04-30: 1000 mL via INTRAVENOUS

## 2014-05-01 LAB — TYPE AND SCREEN
ABO/RH(D): O POS
ANTIBODY SCREEN: NEGATIVE
UNIT DIVISION: 0
UNIT DIVISION: 0

## 2014-05-04 LAB — TISSUE HYBRIDIZATION (BONE MARROW)-NCBH

## 2014-05-04 LAB — CHROMOSOME ANALYSIS, BONE MARROW

## 2014-05-07 ENCOUNTER — Encounter: Payer: Self-pay | Admitting: Hematology and Oncology

## 2014-05-07 ENCOUNTER — Ambulatory Visit (HOSPITAL_BASED_OUTPATIENT_CLINIC_OR_DEPARTMENT_OTHER): Payer: Medicare Other

## 2014-05-07 ENCOUNTER — Ambulatory Visit (HOSPITAL_BASED_OUTPATIENT_CLINIC_OR_DEPARTMENT_OTHER): Payer: Medicare Other | Admitting: Hematology and Oncology

## 2014-05-07 ENCOUNTER — Telehealth: Payer: Self-pay | Admitting: Hematology and Oncology

## 2014-05-07 VITALS — BP 98/34 | HR 85 | Temp 97.5°F | Resp 20 | Ht 68.0 in | Wt 173.5 lb

## 2014-05-07 DIAGNOSIS — D649 Anemia, unspecified: Secondary | ICD-10-CM

## 2014-05-07 DIAGNOSIS — I509 Heart failure, unspecified: Secondary | ICD-10-CM

## 2014-05-07 DIAGNOSIS — D46C Myelodysplastic syndrome with isolated del(5q) chromosomal abnormality: Secondary | ICD-10-CM

## 2014-05-07 DIAGNOSIS — R0602 Shortness of breath: Secondary | ICD-10-CM

## 2014-05-07 DIAGNOSIS — R5383 Other fatigue: Secondary | ICD-10-CM

## 2014-05-07 DIAGNOSIS — D696 Thrombocytopenia, unspecified: Secondary | ICD-10-CM

## 2014-05-07 DIAGNOSIS — R5381 Other malaise: Secondary | ICD-10-CM

## 2014-05-07 DIAGNOSIS — Z992 Dependence on renal dialysis: Secondary | ICD-10-CM

## 2014-05-07 DIAGNOSIS — N186 End stage renal disease: Secondary | ICD-10-CM

## 2014-05-07 DIAGNOSIS — D539 Nutritional anemia, unspecified: Secondary | ICD-10-CM

## 2014-05-07 LAB — CBC & DIFF AND RETIC
BASO%: 0.3 % (ref 0.0–2.0)
Basophils Absolute: 0 10*3/uL (ref 0.0–0.1)
EOS ABS: 0.2 10*3/uL (ref 0.0–0.5)
EOS%: 5.9 % (ref 0.0–7.0)
HCT: 30.6 % — ABNORMAL LOW (ref 38.4–49.9)
HGB: 9.8 g/dL — ABNORMAL LOW (ref 13.0–17.1)
Immature Retic Fract: 11.8 % — ABNORMAL HIGH (ref 3.00–10.60)
LYMPH%: 43.4 % (ref 14.0–49.0)
MCH: 34.3 pg — ABNORMAL HIGH (ref 27.2–33.4)
MCHC: 32 g/dL (ref 32.0–36.0)
MCV: 107 fL — AB (ref 79.3–98.0)
MONO#: 0.2 10*3/uL (ref 0.1–0.9)
MONO%: 5.9 % (ref 0.0–14.0)
NEUT#: 1.3 10*3/uL — ABNORMAL LOW (ref 1.5–6.5)
NEUT%: 44.5 % (ref 39.0–75.0)
Platelets: 96 10*3/uL — ABNORMAL LOW (ref 140–400)
RBC: 2.86 10*6/uL — AB (ref 4.20–5.82)
RDW: 19.2 % — ABNORMAL HIGH (ref 11.0–14.6)
RETIC %: 1.05 % (ref 0.80–1.80)
RETIC CT ABS: 30.03 10*3/uL — AB (ref 34.80–93.90)
WBC: 2.9 10*3/uL — ABNORMAL LOW (ref 4.0–10.3)
lymph#: 1.3 10*3/uL (ref 0.9–3.3)

## 2014-05-07 LAB — IRON AND TIBC CHCC
%SAT: 96 % — ABNORMAL HIGH (ref 20–55)
Iron: 223 ug/dL — ABNORMAL HIGH (ref 42–163)
TIBC: 231 ug/dL (ref 202–409)
UIBC: 8 ug/dL — ABNORMAL LOW (ref 117–376)

## 2014-05-07 LAB — FERRITIN CHCC

## 2014-05-07 LAB — HOLD TUBE, BLOOD BANK

## 2014-05-07 LAB — VITAMIN B12: VITAMIN B 12: 941 pg/mL — AB (ref 211–911)

## 2014-05-07 MED ORDER — LENALIDOMIDE 5 MG PO CAPS: 5.0000 mg | ORAL_CAPSULE | Freq: Every day | ORAL | Status: DC

## 2014-05-07 NOTE — Progress Notes (Signed)
Faxed revlimid prescription to [facility] °

## 2014-05-07 NOTE — Progress Notes (Signed)
Rx for Revlimid given to Andrew [name] in managed care dept for prior auth.

## 2014-05-07 NOTE — Progress Notes (Signed)
Andrew [name]  Patient Care Team: Redmond School, MD as PCP - General (Internal Medicine) Daneil Dolin, MD (Gastroenterology) Geoffry Paradise, MD (Nephrology) Estanislado Emms, MD as Consulting Physician (Nephrology) Baird Cancer, PA-C as Physician Assistant (Physician Assistant) Heath Lark, MD as Consulting Physician (Hematology and Oncology)   SUMMARY OF ONCOLOGIC HISTORY: He has multiple advanced medical problems including underlying coronary disease, end-stage renal disease on dialysis, multifactorial anemia related to renal disease and an underlying myelodysplastic syndrome  During a July 2014 hospitalization, he was transfusion dependent for blood despite Aranesp support. Hematologic profile suggested 5Q minus syndrome. He agreed to a bone marrow biopsy on August 5. Findings were consistent with a myelodysplastic syndrome but the 5Q minus deletion was not seen on FISH studies. He was started on a trial of Revlimid at a low dose of 5 mg 3 times weekly after each dialysis procedure. He was only on the drug for about 2 weeks when he was readmitted to the hospital on September 11 with profound pancytopenia: Hemoglobin 7 g, white count 1000, platelets 33,000. Revlimid was stopped. His blood counts have steadily improved. He has not required any additional transfusions since the September hospitalization and his hemoglobin has steadily come up to a peak  value of 13 g recorded on 11/25/2013.Marland Kitchen His white blood count has normalized. His platelet count has recovered to over 100,000.  He continue on dialysis 3 times a week on Mondays, Wednesdays and Fridays.  INTERVAL HISTORY: Please see below for problem oriented charting. He complains of feeling fatigued and weak with occasional shortness of breath. The patient denies any recent signs or symptoms of bleeding such as spontaneous epistaxis, hematuria or hematochezia.   REVIEW OF SYSTEMS:   Constitutional: Denies  fevers, chills  Eyes: Denies blurriness of vision Ears, nose, mouth, throat, and face: Denies mucositis or sore throat Gastrointestinal:  Denies nausea, heartburn or change in bowel habits Skin: Denies abnormal skin rashes Lymphatics: Denies new lymphadenopathy or easy bruising Behavioral/Psych: Mood is stable, no new changes  All other systems were reviewed with the patient and are negative.  I have reviewed the past medical history, past surgical history, social history and family history with the patient and they are unchanged from previous [name].  ALLERGIES:  has No Known Allergies.  MEDICATIONS:  Current Outpatient Prescriptions  Medication Sig Dispense Refill  . acetaminophen (TYLENOL) 500 MG tablet Take 500 mg by mouth every 6 (six) hours as needed. For pain/headache      . albuterol (PROVENTIL HFA;VENTOLIN HFA) 108 (90 BASE) MCG/ACT inhaler Inhale 2 puffs into the lungs every 6 (six) hours as needed for wheezing or shortness of breath.  1 Inhaler  0  . allopurinol (ZYLOPRIM) 100 MG tablet Take 200 mg by mouth at bedtime.       Marland Kitchen aspirin EC 81 MG tablet Take 81 mg by mouth at bedtime.       Marland Kitchen atorvastatin (LIPITOR) 10 MG tablet Take 10 mg by mouth every morning.       . Azithromycin (ZITHROMAX Z-PAK PO) Take by mouth.      . B Complex-C-Folic Acid (DIALYVITE TABLET) TABS Take 1 tablet by mouth at bedtime.       . calcium carbonate (TUMS - DOSED IN MG ELEMENTAL CALCIUM) 500 MG chewable tablet Chew 1 tablet by mouth daily as needed for heartburn. For heartburn      . cinacalcet (SENSIPAR) 30 MG tablet Take 30 mg by mouth  daily with breakfast.       . colchicine (COLCRYS) 0.6 MG tablet Take 0.6 mg by mouth 2 (two) times daily as needed. for gout      . docusate sodium (COLACE) 100 MG capsule Take 100 mg by mouth daily.      Marland Kitchen gabapentin (NEURONTIN) 100 MG capsule Take 100 mg by mouth 2 (two) times daily. Takes 1 in the morning and 2 at night      . midodrine (PROAMATINE) 10 MG tablet  Take 20 mg by mouth 3 (three) times daily.       Marland Kitchen omeprazole (PRILOSEC) 40 MG capsule Take 40 mg by mouth 2 (two) times daily. For reflux      . polyethylene glycol powder (MIRALAX) powder Take 17 g by mouth at bedtime.       . sevelamer (RENVELA) 800 MG tablet Take 2,400 mg by mouth 3 (three) times daily with meals. 4 tabs 3 x daily and 2 with snack      . lenalidomide (REVLIMID) 5 MG capsule Take 1 capsule (5 mg total) by mouth daily.  28 capsule  0   No current facility-administered medications for this visit.   Facility-Administered Medications Ordered in Other Visits  Medication Dose Route Frequency Provider Last Rate Last Dose  . methylPREDNISolone sodium succinate (SOLU-MEDROL) 125 mg/2 mL injection 60 mg  60 mg Intravenous Once Sol Blazing, MD        PHYSICAL EXAMINATION: ECOG PERFORMANCE STATUS: 2 - Symptomatic, <50% confined to bed  Filed Vitals:   05/07/14 1126  BP: 98/34  Pulse: 85  Temp: 97.5 F (36.4 C)  Resp: 20   Filed Weights   05/07/14 1126  Weight: 173 lb 8 oz (78.699 kg)    GENERAL:alert, no distress and comfortable. He looks elderly and debilitated SKIN: skin color, texture, turgor are normal, no rashes or significant lesions EYES: normal, Conjunctiva are pale and non-injected, sclera clear Musculoskeletal:no cyanosis of digits and no clubbing  NEURO: alert & oriented x 3 with fluent speech, no focal motor/sensory deficits  LABORATORY DATA:  I have reviewed the data as listed    Component Value Date/Time   NA 142 08/26/2013 1119   NA 135 08/14/2013 0640   K 4.1 08/26/2013 1119   K 3.9 08/14/2013 0640   K 6.0 03/23/2011 0912   CL 95* 08/14/2013 0640   CO2 33* 08/26/2013 1119   CO2 31 08/14/2013 0640   GLUCOSE 104 08/26/2013 1119   GLUCOSE 91 08/14/2013 0640   BUN 26.8* 08/26/2013 1119   BUN 21 08/14/2013 0640   CREATININE 5.3* 08/26/2013 1119   CREATININE 3.00* 08/14/2013 0640   CALCIUM 8.1* 08/26/2013 1119   CALCIUM 8.0* 08/14/2013 0640   CALCIUM 7.8*  03/13/2012 0527   PROT 6.4 08/26/2013 1119   PROT 5.5* 06/27/2013 0540   ALBUMIN 3.1* 08/26/2013 1119   ALBUMIN 2.5* 07/02/2013 0646   AST 29 08/26/2013 1119   AST 31 06/27/2013 0540   ALT 23 08/26/2013 1119   ALT 18 06/27/2013 0540   ALKPHOS 130 08/26/2013 1119   ALKPHOS 97 06/27/2013 0540   ALKPHOS 103 03/23/2011 0912   BILITOT 0.63 08/26/2013 1119   BILITOT 0.9 06/27/2013 0540   GFRNONAA 18* 08/14/2013 0640   GFRAA 20* 08/14/2013 0640    No results found for this basename: SPEP, UPEP,  kappa and lambda light chains    Lab Results  Component Value Date   WBC 2.9* 05/07/2014   NEUTROABS 1.3* 05/07/2014  HGB 9.8* 05/07/2014   HCT 30.6* 05/07/2014   MCV 107.0* 05/07/2014   PLT 96* 05/07/2014      Chemistry      Component Value Date/Time   NA 142 08/26/2013 1119   NA 135 08/14/2013 0640   K 4.1 08/26/2013 1119   K 3.9 08/14/2013 0640   K 6.0 03/23/2011 0912   CL 95* 08/14/2013 0640   CO2 33* 08/26/2013 1119   CO2 31 08/14/2013 0640   BUN 26.8* 08/26/2013 1119   BUN 21 08/14/2013 0640   CREATININE 5.3* 08/26/2013 1119   CREATININE 3.00* 08/14/2013 0640      Component Value Date/Time   CALCIUM 8.1* 08/26/2013 1119   CALCIUM 8.0* 08/14/2013 0640   CALCIUM 7.8* 03/13/2012 0527   ALKPHOS 130 08/26/2013 1119   ALKPHOS 97 06/27/2013 0540   ALKPHOS 103 03/23/2011 0912   AST 29 08/26/2013 1119   AST 31 06/27/2013 0540   ALT 23 08/26/2013 1119   ALT 18 06/27/2013 0540   BILITOT 0.63 08/26/2013 1119   BILITOT 0.9 06/27/2013 0540      ASSESSMENT & PLAN:  MDS (myelodysplastic syndrome) with 5q deletion Previously, he had excellent response to Revlimid however it was discontinued due to profound pancytopenia and significant toxicity. I plan to start him on only Revlimid 5 mg once a week for a month before we increase his dose slowly. Thankfully, bone marrow aspirate biopsy showed no evidence of progression to leukemia. We discussed the role of chemotherapy. The intent is for palliative.  We discussed some of the  risks, benefits, side-effects of Revlimid.   Some of the short term side-effects included, though not limited to, risk of fatigue, weight loss, pancytopenia, life-threatening infections, need for transfusions of blood products, nausea, vomiting, change in bowel habits, blood clots, admission to hospital for various reasons, and risks of death.   Long term side-effects are also discussed including risks of infertility, permanent damage to nerve function, chronic fatigue, and rare secondary malignancy including bone marrow disorders such as acute leukemia.   The patient is aware that the response rates discussed earlier is not guaranteed.    After a long discussion, patient made an informed decision to proceed with the prescribed plan of care and went ahead to sign the consent form today.   Patient education material was dispensed While on Revlimid, the patient will continue on aspirin for DVT prophylaxis.   ANEMIA This is likely anemia of chronic disease. The patient denies recent history of bleeding such as epistaxis, hematuria or hematochezia. He is asymptomatic from the anemia. We will observe for now.  He does not require transfusion now.  He will continue to receive Procrit during dialysis.  ESRD (end stage renal disease) He will continue dialysis on Mondays, Wednesdays and Fridays. I have instructed the patient to get blood work done on Wednesdays every week and to have the results faxed to [place] so that we can monitor his blood counts carefully.  Thrombocytopenia This is due to underlying bone marrow disorder. It is mild and slightly worse compared from previous platelet count. The patient denies recent history of bleeding such as epistaxis, hematuria or hematochezia. He is asymptomatic from the thrombocytopenia. I will observe for now.  he does not require transfusion now.     CHF (congestive heart failure) Clinically, he appears euvolemic although he complained of shortness of  breath and weakness which I suspect is due to anemia and diastolic heart dysfunction. Continue to monitor carefully.  Orders Placed This Encounter  Procedures  . CBC & Diff and Retic    Standing Status: Future     Number of Occurrences: 1     Standing Expiration Date: 05/07/2015  . Iron and TIBC    Standing Status: Future     Number of Occurrences: 1     Standing Expiration Date: 05/07/2015  . Ferritin    Standing Status: Future     Number of Occurrences: 1     Standing Expiration Date: 05/07/2015  . Vitamin B12    Standing Status: Future     Number of Occurrences: 1     Standing Expiration Date: 05/07/2015  . Hold Tube, Blood Bank    Standing Status: Future     Number of Occurrences: 1     Standing Expiration Date: 05/07/2015   All questions were answered. The patient knows to call the clinic with any problems, questions or concerns. No barriers to learning was detected.   [facility], Lucilla Petrenko, MD 05/07/2014 9:31 PM

## 2014-05-07 NOTE — Assessment & Plan Note (Signed)
Previously, he had excellent response to Revlimid however it was discontinued due to profound pancytopenia and significant toxicity. I plan to start him on only Revlimid 5 mg once a week for a month before we increase his dose slowly. Thankfully, bone marrow aspirate biopsy showed no evidence of progression to leukemia. We discussed the role of chemotherapy. The intent is for palliative.  We discussed some of the risks, benefits, side-effects of Revlimid.   Some of the short term side-effects included, though not limited to, risk of fatigue, weight loss, pancytopenia, life-threatening infections, need for transfusions of blood products, nausea, vomiting, change in bowel habits, blood clots, admission to hospital for various reasons, and risks of death.   Long term side-effects are also discussed including risks of infertility, permanent damage to nerve function, chronic fatigue, and rare secondary malignancy including bone marrow disorders such as acute leukemia.   The patient is aware that the response rates discussed earlier is not guaranteed.    After a long discussion, patient made an informed decision to proceed with the prescribed plan of care and went ahead to sign the consent form today.   Patient education material was dispensed While on Revlimid, the patient will continue on aspirin for DVT prophylaxis.

## 2014-05-07 NOTE — Progress Notes (Signed)
Per [employer] this patient has to use [facility] for his specialty pharmacy; faxed revlimid prescription to [phone]

## 2014-05-07 NOTE — Telephone Encounter (Signed)
gv and printed appt sched and avs for ptf or July....sent pt to lab

## 2014-05-07 NOTE — Assessment & Plan Note (Signed)
He will continue dialysis on Mondays, Wednesdays and Fridays. I have instructed the patient to get blood work done on Wednesdays every week and to have the results faxed to [place] so that we can monitor his blood counts carefully.

## 2014-05-07 NOTE — Assessment & Plan Note (Signed)
This is likely anemia of chronic disease. The patient denies recent history of bleeding such as epistaxis, hematuria or hematochezia. He is asymptomatic from the anemia. We will observe for now.  He does not require transfusion now.  He will continue to receive Procrit during dialysis.

## 2014-05-07 NOTE — Assessment & Plan Note (Signed)
Clinically, he appears euvolemic although he complained of shortness of breath and weakness which I suspect is due to anemia and diastolic heart dysfunction. Continue to monitor carefully.

## 2014-05-07 NOTE — Assessment & Plan Note (Signed)
This is due to underlying bone marrow disorder. It is mild and slightly worse compared from previous platelet count. The patient denies recent history of bleeding such as epistaxis, hematuria or hematochezia. He is asymptomatic from the thrombocytopenia. I will observe for now.  he does not require transfusion now.

## 2014-05-08 ENCOUNTER — Encounter (HOSPITAL_COMMUNITY): Payer: Self-pay

## 2014-05-11 ENCOUNTER — Encounter: Payer: Self-pay | Admitting: *Deleted

## 2014-05-11 NOTE — Progress Notes (Signed)
Labs received from [place].  Forwarded to dr. Calton Dach desk for her review.

## 2014-05-12 ENCOUNTER — Other Ambulatory Visit: Payer: Self-pay

## 2014-05-12 NOTE — Progress Notes (Signed)
Sent in stat requisition to [facility] for patient to have CBC with Diff to be done weekly with Dialysis per Dr.Gorsuch. Per the nurse from [facility], these results will be faxed over from the hospital weekly.

## 2014-05-15 ENCOUNTER — Ambulatory Visit (INDEPENDENT_AMBULATORY_CARE_PROVIDER_SITE_OTHER): Payer: Medicare Other | Admitting: Urology

## 2014-05-15 DIAGNOSIS — Z8551 Personal history of malignant neoplasm of bladder: Secondary | ICD-10-CM

## 2014-05-19 ENCOUNTER — Ambulatory Visit: Payer: Medicare Other | Admitting: Hematology and Oncology

## 2014-05-19 ENCOUNTER — Other Ambulatory Visit: Payer: Medicare Other

## 2014-05-21 ENCOUNTER — Encounter: Payer: Self-pay | Admitting: Hematology and Oncology

## 2014-05-22 ENCOUNTER — Telehealth: Payer: Self-pay | Admitting: *Deleted

## 2014-05-22 ENCOUNTER — Other Ambulatory Visit: Payer: Self-pay | Admitting: Hematology and Oncology

## 2014-05-22 ENCOUNTER — Telehealth: Payer: Self-pay | Admitting: Hematology and Oncology

## 2014-05-22 DIAGNOSIS — D46C Myelodysplastic syndrome with isolated del(5q) chromosomal abnormality: Secondary | ICD-10-CM

## 2014-05-22 NOTE — Telephone Encounter (Signed)
s.w. pt and advised on 6.30 appt.....pt ok and aware °

## 2014-05-22 NOTE — Telephone Encounter (Signed)
Called daughter Almus Woodham and informed her re: Dr. Alvy Bimler reviewed her email from [place] message.  However, pt will need to come today for lab rechecked and possibly blood transfusion if needed.  Diane stated pt could not come today due to pt will not finish dialysis until 530pm.  Diane requested for lab and possible blood transfusion to be scheduled for Tues 6/30 - pt has dialysis  MWF.  Diane stated if pt becomes symptomatic over weekend, she will take pt to ER for further management.  Dr. Alvy Bimler notified. Spoke with Diane again and informed her that a scheduler will contact pt with appt time for Tues. 05/26/14.  Diane voiced understanding. Sharon and gave her appt time for pt on 05/26/14.

## 2014-05-26 ENCOUNTER — Ambulatory Visit (HOSPITAL_COMMUNITY)
Admission: RE | Admit: 2014-05-26 | Discharge: 2014-05-26 | Disposition: A | Discharge status: Home / Self Care | Payer: Medicare Other | Source: Ambulatory Visit | Attending: Hematology and Oncology | Admitting: Hematology and Oncology

## 2014-05-26 ENCOUNTER — Ambulatory Visit (HOSPITAL_BASED_OUTPATIENT_CLINIC_OR_DEPARTMENT_OTHER): Payer: Medicare Other

## 2014-05-26 ENCOUNTER — Other Ambulatory Visit (HOSPITAL_BASED_OUTPATIENT_CLINIC_OR_DEPARTMENT_OTHER): Payer: Medicare Other

## 2014-05-26 ENCOUNTER — Other Ambulatory Visit: Payer: Self-pay | Admitting: *Deleted

## 2014-05-26 ENCOUNTER — Telehealth: Payer: Self-pay | Admitting: *Deleted

## 2014-05-26 VITALS — BP 130/52 | HR 80 | Temp 97.6°F | Resp 18

## 2014-05-26 DIAGNOSIS — D649 Anemia, unspecified: Secondary | ICD-10-CM

## 2014-05-26 DIAGNOSIS — D46C Myelodysplastic syndrome with isolated del(5q) chromosomal abnormality: Secondary | ICD-10-CM

## 2014-05-26 DIAGNOSIS — D469 Myelodysplastic syndrome, unspecified: Secondary | ICD-10-CM

## 2014-05-26 LAB — CBC & DIFF AND RETIC
BASO%: 0 % (ref 0.0–2.0)
Basophils Absolute: 0 10*3/uL (ref 0.0–0.1)
EOS%: 5.2 % (ref 0.0–7.0)
Eosinophils Absolute: 0.1 10*3/uL (ref 0.0–0.5)
HCT: 24.2 % — ABNORMAL LOW (ref 38.4–49.9)
HGB: 7.8 g/dL — ABNORMAL LOW (ref 13.0–17.1)
Immature Retic Fract: 16.6 % — ABNORMAL HIGH (ref 3.00–10.60)
LYMPH%: 58.6 % — AB (ref 14.0–49.0)
MCH: 36.1 pg — AB (ref 27.2–33.4)
MCHC: 32.2 g/dL (ref 32.0–36.0)
MCV: 112 fL — AB (ref 79.3–98.0)
MONO#: 0.1 10*3/uL (ref 0.1–0.9)
MONO%: 6.3 % (ref 0.0–14.0)
NEUT%: 29.9 % — ABNORMAL LOW (ref 39.0–75.0)
NEUTROS ABS: 0.5 10*3/uL — AB (ref 1.5–6.5)
Platelets: 76 10*3/uL — ABNORMAL LOW (ref 140–400)
RBC: 2.16 10*6/uL — AB (ref 4.20–5.82)
RDW: 21.3 % — ABNORMAL HIGH (ref 11.0–14.6)
RETIC %: 1.61 % (ref 0.80–1.80)
Retic Ct Abs: 34.78 10*3/uL — ABNORMAL LOW (ref 34.80–93.90)
WBC: 1.7 10*3/uL — ABNORMAL LOW (ref 4.0–10.3)
lymph#: 1 10*3/uL (ref 0.9–3.3)

## 2014-05-26 LAB — HOLD TUBE, BLOOD BANK

## 2014-05-26 LAB — PREPARE RBC (CROSSMATCH)

## 2014-05-26 MED ORDER — ACETAMINOPHEN 325 MG PO TABS
650.0000 mg | ORAL_TABLET | Freq: Once | ORAL | Status: AC
  Administered 2014-05-26: 650 mg via ORAL

## 2014-05-26 MED ORDER — SODIUM CHLORIDE 0.9 % IV SOLN
Freq: Once | INTRAVENOUS | Status: AC
  Administered 2014-05-26: 13:00:00 via INTRAVENOUS

## 2014-05-26 MED ORDER — DIPHENHYDRAMINE HCL 25 MG PO CAPS
ORAL_CAPSULE | ORAL | Status: AC
  Filled 2014-05-26: qty 1

## 2014-05-26 MED ORDER — DIPHENHYDRAMINE HCL 25 MG PO CAPS
25.0000 mg | ORAL_CAPSULE | Freq: Once | ORAL | Status: AC
  Administered 2014-05-26: 25 mg via ORAL

## 2014-05-26 MED ORDER — ACETAMINOPHEN 325 MG PO TABS
ORAL_TABLET | ORAL | Status: AC
  Filled 2014-05-26: qty 2

## 2014-05-26 NOTE — Patient Instructions (Signed)

## 2014-05-26 NOTE — Telephone Encounter (Signed)
Per desk RN voicemail I have scheduled lab appt

## 2014-05-26 NOTE — Progress Notes (Signed)
Instructed pt to Hold Revlimid per Dr. Calton Dach instructions.   Do not take Revlimid until he has lab and sees Dr. Alvy Bimler again on 7/14.  He verbalized understanding.

## 2014-05-27 ENCOUNTER — Telehealth: Payer: Self-pay | Admitting: Hematology and Oncology

## 2014-05-27 LAB — TYPE AND SCREEN
ABO/RH(D): O POS
Antibody Screen: NEGATIVE
Unit division: 0

## 2014-05-27 NOTE — Telephone Encounter (Signed)
labs already schedule per 06/30 POF

## 2014-06-04 ENCOUNTER — Other Ambulatory Visit: Payer: Self-pay | Admitting: *Deleted

## 2014-06-04 ENCOUNTER — Telehealth: Payer: Self-pay | Admitting: *Deleted

## 2014-06-04 NOTE — Telephone Encounter (Signed)
Dr. Alvy Bimler reviewed CBC results from 7/08 received from [facility].  She instructs for pt to continue to Hold Revlimid and keep appts next week for lab/MD visit as scheduled.  Pt verbalized understanding to hold revlimid and keep appt next week.

## 2014-06-04 NOTE — Telephone Encounter (Signed)
THIS REFILL REQUEST FOR REVLIMID WAS PLACED ON DR.GORSUCH'S DESK. 

## 2014-06-05 ENCOUNTER — Other Ambulatory Visit: Payer: Self-pay | Admitting: *Deleted

## 2014-06-05 ENCOUNTER — Telehealth: Payer: Self-pay | Admitting: *Deleted

## 2014-06-05 DIAGNOSIS — D539 Nutritional anemia, unspecified: Secondary | ICD-10-CM

## 2014-06-05 DIAGNOSIS — D46C Myelodysplastic syndrome with isolated del(5q) chromosomal abnormality: Secondary | ICD-10-CM

## 2014-06-05 MED ORDER — LENALIDOMIDE 2.5 MG PO CAPS: 2.5000 mg | ORAL_CAPSULE | ORAL | Status: DC

## 2014-06-05 NOTE — Telephone Encounter (Signed)
Pharmacy wants to confirm w/ Dr. Alvy Bimler that new dose and frequency of Revlidmid is 2.5 mg weekly.

## 2014-06-06 NOTE — Telephone Encounter (Signed)
yes

## 2014-06-08 ENCOUNTER — Telehealth: Payer: Self-pay | Admitting: *Deleted

## 2014-06-08 NOTE — Telephone Encounter (Signed)
[facility] inform [facility] that dose of Revlimid 2.5 mg once a week confirmed w/ Dr. Alvy Bimler and this is correct.  Left VM for Revlimid team informing of above.

## 2014-06-09 ENCOUNTER — Other Ambulatory Visit (HOSPITAL_BASED_OUTPATIENT_CLINIC_OR_DEPARTMENT_OTHER): Payer: Medicare Other

## 2014-06-09 ENCOUNTER — Encounter: Payer: Self-pay | Admitting: Hematology and Oncology

## 2014-06-09 ENCOUNTER — Other Ambulatory Visit: Payer: Self-pay | Admitting: Hematology and Oncology

## 2014-06-09 ENCOUNTER — Telehealth: Payer: Self-pay | Admitting: Hematology and Oncology

## 2014-06-09 ENCOUNTER — Ambulatory Visit (HOSPITAL_BASED_OUTPATIENT_CLINIC_OR_DEPARTMENT_OTHER): Payer: Medicare Other | Admitting: Hematology and Oncology

## 2014-06-09 VITALS — BP 128/47 | HR 71 | Temp 97.8°F | Resp 18 | Ht 68.0 in | Wt 176.0 lb

## 2014-06-09 DIAGNOSIS — D696 Thrombocytopenia, unspecified: Secondary | ICD-10-CM

## 2014-06-09 DIAGNOSIS — D46C Myelodysplastic syndrome with isolated del(5q) chromosomal abnormality: Secondary | ICD-10-CM

## 2014-06-09 DIAGNOSIS — D649 Anemia, unspecified: Secondary | ICD-10-CM

## 2014-06-09 DIAGNOSIS — D72819 Decreased white blood cell count, unspecified: Secondary | ICD-10-CM

## 2014-06-09 DIAGNOSIS — T451X5A Adverse effect of antineoplastic and immunosuppressive drugs, initial encounter: Secondary | ICD-10-CM

## 2014-06-09 DIAGNOSIS — D630 Anemia in neoplastic disease: Secondary | ICD-10-CM

## 2014-06-09 DIAGNOSIS — D701 Agranulocytosis secondary to cancer chemotherapy: Secondary | ICD-10-CM

## 2014-06-09 DIAGNOSIS — N186 End stage renal disease: Secondary | ICD-10-CM

## 2014-06-09 DIAGNOSIS — Z992 Dependence on renal dialysis: Secondary | ICD-10-CM

## 2014-06-09 LAB — CBC WITH DIFFERENTIAL/PLATELET
BASO%: 0.9 % (ref 0.0–2.0)
BASOS ABS: 0 10*3/uL (ref 0.0–0.1)
EOS%: 6.5 % (ref 0.0–7.0)
Eosinophils Absolute: 0.1 10*3/uL (ref 0.0–0.5)
HCT: 27.2 % — ABNORMAL LOW (ref 38.4–49.9)
HGB: 8.7 g/dL — ABNORMAL LOW (ref 13.0–17.1)
LYMPH%: 57.4 % — ABNORMAL HIGH (ref 14.0–49.0)
MCH: 35.8 pg — AB (ref 27.2–33.4)
MCHC: 32 g/dL (ref 32.0–36.0)
MCV: 111.9 fL — ABNORMAL HIGH (ref 79.3–98.0)
MONO#: 0.2 10*3/uL (ref 0.1–0.9)
MONO%: 8.8 % (ref 0.0–14.0)
NEUT#: 0.6 10*3/uL — ABNORMAL LOW (ref 1.5–6.5)
NEUT%: 26.4 % — ABNORMAL LOW (ref 39.0–75.0)
Platelets: 76 10*3/uL — ABNORMAL LOW (ref 140–400)
RBC: 2.43 10*6/uL — ABNORMAL LOW (ref 4.20–5.82)
RDW: 21.8 % — AB (ref 11.0–14.6)
WBC: 2.2 10*3/uL — ABNORMAL LOW (ref 4.0–10.3)
lymph#: 1.2 10*3/uL (ref 0.9–3.3)

## 2014-06-09 LAB — HOLD TUBE, BLOOD BANK

## 2014-06-09 NOTE — Assessment & Plan Note (Signed)
This is due to underlying bone marrow disorder and side effects of Revlimid. It is mild and slightly worse compared from previous platelet count. The patient denies recent history of bleeding such as epistaxis, hematuria or hematochezia. He is asymptomatic from the thrombocytopenia. I will observe for now.  he does not require transfusion now.

## 2014-06-09 NOTE — Telephone Encounter (Signed)
gv pt appt schedule for july/aug °

## 2014-06-09 NOTE — Assessment & Plan Note (Signed)
Previously, he had excellent response to Revlimid however it was discontinued due to profound pancytopenia and significant toxicity. I started him on only Revlimid 5 mg once a week but his treatment has to be put on hold due to severe pancytopenia. I recommend continue to hold treatment until [place] greater than 1000. The patient will return here on a weekly basis to get his blood work be checked. Once his [place] is greater than 1000, we will resume Revlimid at 2.5 mg once a week on Fridays after his dialysis. I will see him back in 6 weeks.

## 2014-06-09 NOTE — Assessment & Plan Note (Signed)
This is likely due to recent treatment. I will observe for now. I do not recommended use of G-CSF. We will continue to hold chemotherapy until [place] greater than 1000.

## 2014-06-09 NOTE — Assessment & Plan Note (Signed)
This is likely anemia of chronic disease. The patient denies recent history of bleeding such as epistaxis, hematuria or hematochezia. He is asymptomatic from the anemia. We will observe for now.  He does not require transfusion now.  We discussed establishing transfusion threshold to give him 1 unit of blood each time if his hemoglobin dropped to less than 8 g.

## 2014-06-09 NOTE — Progress Notes (Signed)
[place] OFFICE PROGRESS NOTE  Patient Care Team: Redmond School, MD as PCP - General (Internal Medicine) Daneil Dolin, MD (Gastroenterology) Geoffry Paradise, MD (Nephrology) Estanislado Emms, MD as Consulting Physician (Nephrology) Baird Cancer, PA-C as Physician Assistant (Physician Assistant) Heath Lark, MD as Consulting Physician (Hematology and Oncology)  SUMMARY OF ONCOLOGIC HISTORY: He has multiple advanced medical problems including underlying coronary disease, end-stage renal disease on dialysis, multifactorial anemia related to renal disease and an underlying myelodysplastic syndrome  During a July 2014 hospitalization, he was transfusion dependent for blood despite Aranesp support. Hematologic profile suggested 5Q minus syndrome. He agreed to a bone marrow biopsy on August 5. Findings were consistent with a myelodysplastic syndrome but the 5Q minus deletion was not seen on FISH studies. He was started on a trial of Revlimid at a low dose of 5 mg 3 times weekly after each dialysis procedure. He was only on the drug for about 2 weeks when he was readmitted to the hospital on September 11 with profound pancytopenia: Hemoglobin 7 g, white count 1000, platelets 33,000. Revlimid was stopped. His blood counts have steadily improved. He has not required any additional transfusions since the September hospitalization and his hemoglobin has steadily come up to a peak  value of 13 g recorded on 11/25/2013.Marland Kitchen His white blood count has normalized. His platelet count has recovered to over 100,000. He continue on dialysis 3 times a week on Mondays, Wednesdays and Fridays. On 04/28/2014, repeat bone marrow aspirate and biopsy confirmed persistent MDS. On 05/08/2014, he was started on Revlimid 5 mg weekly. On 05/16/6014, Revlimid was placed on hold due to severe pancytopenia  INTERVAL HISTORY: Please see below for problem oriented charting. He complained of feeling weak and tired. He  had some dental infection, requiring dental extraction. The patient denies any recent signs or symptoms of bleeding such as spontaneous epistaxis, hematuria or hematochezia.  REVIEW OF SYSTEMS:   Constitutional: Denies fevers, chills or abnormal weight loss Eyes: Denies blurriness of vision Ears, nose, mouth, throat, and face: Denies mucositis or sore throat Respiratory: Denies cough, dyspnea or wheezes Cardiovascular: Denies palpitation, chest discomfort or lower extremity swelling Gastrointestinal:  Denies nausea, heartburn or change in bowel habits Skin: Denies abnormal skin rashes Lymphatics: Denies new lymphadenopathy or easy bruising Neurological:Denies numbness, tingling or new weaknesses Behavioral/Psych: Mood is stable, no new changes  All other systems were reviewed with the patient and are negative.  I have reviewed the past medical history, past surgical history, social history and family history with the patient and they are unchanged from previous note.  ALLERGIES:  has No Known Allergies.  MEDICATIONS:  Current Outpatient Prescriptions  Medication Sig Dispense Refill  . acetaminophen (TYLENOL) 500 MG tablet Take 500 mg by mouth every 6 (six) hours as needed. For pain/headache      . allopurinol (ZYLOPRIM) 100 MG tablet Take 200 mg by mouth at bedtime.       Marland Kitchen aspirin EC 81 MG tablet Take 81 mg by mouth at bedtime.       Marland Kitchen atorvastatin (LIPITOR) 10 MG tablet Take 10 mg by mouth every morning.       . B Complex-C-Folic Acid (DIALYVITE TABLET) TABS Take 1 tablet by mouth at bedtime.       . calcium carbonate (TUMS - DOSED IN MG ELEMENTAL CALCIUM) 500 MG chewable tablet Chew 1 tablet by mouth daily as needed for heartburn. For heartburn      . cinacalcet (  SENSIPAR) 30 MG tablet Take 30 mg by mouth daily with breakfast.       . colchicine (COLCRYS) 0.6 MG tablet Take 0.6 mg by mouth 2 (two) times daily as needed. for gout      . docusate sodium (COLACE) 100 MG capsule Take  100 mg by mouth daily.      Marland Kitchen gabapentin (NEURONTIN) 100 MG capsule Take 100 mg by mouth 2 (two) times daily. Takes 1 in the morning and 2 at night      . midodrine (PROAMATINE) 10 MG tablet Take 20 mg by mouth 3 (three) times daily.       Marland Kitchen omeprazole (PRILOSEC) 40 MG capsule Take 40 mg by mouth 2 (two) times daily. For reflux      . polyethylene glycol powder (MIRALAX) powder Take 17 g by mouth at bedtime.       . sevelamer (RENVELA) 800 MG tablet Take 2,400 mg by mouth 3 (three) times daily with meals. 4 tabs 3 x daily and 2 with snack      . albuterol (PROVENTIL HFA;VENTOLIN HFA) 108 (90 BASE) MCG/ACT inhaler Inhale 2 puffs into the lungs every 6 (six) hours as needed for wheezing or shortness of breath.  1 Inhaler  0   No current facility-administered medications for this visit.   Facility-Administered Medications Ordered in Other Visits  Medication Dose Route Frequency Provider Last Rate Last Dose  . methylPREDNISolone sodium succinate (SOLU-MEDROL) 125 mg/2 mL injection 60 mg  60 mg Intravenous Once Sol Blazing, MD        PHYSICAL EXAMINATION: ECOG PERFORMANCE STATUS: 1 - Symptomatic but completely ambulatory  Filed Vitals:   06/09/14 1102  BP: 128/47  Pulse: 71  Temp: 97.8 F (36.6 C)  Resp: 18   Filed Weights   06/09/14 1102  Weight: 176 lb (79.833 kg)    GENERAL:alert, no distress and comfortable SKIN: skin color, texture, turgor are normal, no rashes or significant lesions. Noticed thin skin EYES: normal, Conjunctiva are pale and non-injected, sclera clear OROPHARYNX:no exudate, no erythema and lips, buccal mucosa, and tongue normal  NECK: supple, thyroid normal size, non-tender, without nodularity LYMPH:  no palpable lymphadenopathy in the cervical, axillary or inguinal LUNGS: clear to auscultation and percussion with normal breathing effort HEART: regular rate & rhythm and no murmurs and no lower extremity edema ABDOMEN:abdomen soft, non-tender and normal  bowel sounds Musculoskeletal:no cyanosis of digits and no clubbing  NEURO: alert & oriented x 3 with fluent speech, no focal motor/sensory deficits. Very poor hearing  LABORATORY DATA:  I have reviewed the data as listed    Component Value Date/Time   NA 142 08/26/2013 1119   NA 135 08/14/2013 0640   K 4.1 08/26/2013 1119   K 3.9 08/14/2013 0640   K 6.0 03/23/2011 0912   CL 95* 08/14/2013 0640   CO2 33* 08/26/2013 1119   CO2 31 08/14/2013 0640   GLUCOSE 104 08/26/2013 1119   GLUCOSE 91 08/14/2013 0640   BUN 26.8* 08/26/2013 1119   BUN 21 08/14/2013 0640   CREATININE 5.3* 08/26/2013 1119   CREATININE 3.00* 08/14/2013 0640   CALCIUM 8.1* 08/26/2013 1119   CALCIUM 8.0* 08/14/2013 0640   CALCIUM 7.8* 03/13/2012 0527   PROT 6.4 08/26/2013 1119   PROT 5.5* 06/27/2013 0540   ALBUMIN 3.1* 08/26/2013 1119   ALBUMIN 2.5* 07/02/2013 0646   AST 29 08/26/2013 1119   AST 31 06/27/2013 0540   ALT 23 08/26/2013 1119   ALT  18 06/27/2013 0540   ALKPHOS 130 08/26/2013 1119   ALKPHOS 97 06/27/2013 0540   ALKPHOS 103 03/23/2011 0912   BILITOT 0.63 08/26/2013 1119   BILITOT 0.9 06/27/2013 0540   GFRNONAA 18* 08/14/2013 0640   GFRAA 20* 08/14/2013 0640    No results found for this basename: SPEP,  UPEP,   kappa and lambda light chains    Lab Results  Component Value Date   WBC 2.2* 06/09/2014   NEUTROABS 0.6* 06/09/2014   HGB 8.7* 06/09/2014   HCT 27.2* 06/09/2014   MCV 111.9* 06/09/2014   PLT 76* 06/09/2014      Chemistry      Component Value Date/Time   NA 142 08/26/2013 1119   NA 135 08/14/2013 0640   K 4.1 08/26/2013 1119   K 3.9 08/14/2013 0640   K 6.0 03/23/2011 0912   CL 95* 08/14/2013 0640   CO2 33* 08/26/2013 1119   CO2 31 08/14/2013 0640   BUN 26.8* 08/26/2013 1119   BUN 21 08/14/2013 0640   CREATININE 5.3* 08/26/2013 1119   CREATININE 3.00* 08/14/2013 0640      Component Value Date/Time   CALCIUM 8.1* 08/26/2013 1119   CALCIUM 8.0* 08/14/2013 0640   CALCIUM 7.8* 03/13/2012 0527   ALKPHOS 130 08/26/2013 1119    ALKPHOS 97 06/27/2013 0540   ALKPHOS 103 03/23/2011 0912   AST 29 08/26/2013 1119   AST 31 06/27/2013 0540   ALT 23 08/26/2013 1119   ALT 18 06/27/2013 0540   BILITOT 0.63 08/26/2013 1119   BILITOT 0.9 06/27/2013 0540     ASSESSMENT & PLAN:  MDS (myelodysplastic syndrome) with 5q deletion Previously, he had excellent response to Revlimid however it was discontinued due to profound pancytopenia and significant toxicity. I started him on only Revlimid 5 mg once a week but his treatment has to be put on hold due to severe pancytopenia. I recommend continue to hold treatment until [place] greater than 1000. The patient will return here on a weekly basis to get his blood work be checked. Once his [place] is greater than 1000, we will resume Revlimid at 2.5 mg once a week on Fridays after his dialysis. I will see him back in 6 weeks.  Thrombocytopenia This is due to underlying bone marrow disorder and side effects of Revlimid. It is mild and slightly worse compared from previous platelet count. The patient denies recent history of bleeding such as epistaxis, hematuria or hematochezia. He is asymptomatic from the thrombocytopenia. I will observe for now.  he does not require transfusion now.       Anemia in neoplastic disease This is likely anemia of chronic disease. The patient denies recent history of bleeding such as epistaxis, hematuria or hematochezia. He is asymptomatic from the anemia. We will observe for now.  He does not require transfusion now.  We discussed establishing transfusion threshold to give him 1 unit of blood each time if his hemoglobin dropped to less than 8 g.   ESRD (end stage renal disease) He will continue dialysis on Mondays, Wednesdays and Fridays.  Leukopenia due to antineoplastic chemotherapy This is likely due to recent treatment. I will observe for now. I do not recommended use of G-CSF. We will continue to hold chemotherapy until [place] greater than 1000.     No orders of  the defined types were placed in this encounter.   All questions were answered. The patient knows to call the clinic with any problems, questions or concerns. No  barriers to learning was detected. I spent 40 minutes counseling the patient face to face. The total time spent in the appointment was 55 minutes and more than 50% was on counseling and review of test results     [facility], [place], MD 06/09/2014 2:36 PM

## 2014-06-09 NOTE — Assessment & Plan Note (Signed)
He will continue dialysis on Mondays, Wednesdays and Fridays.

## 2014-06-16 ENCOUNTER — Other Ambulatory Visit (HOSPITAL_BASED_OUTPATIENT_CLINIC_OR_DEPARTMENT_OTHER): Payer: Medicare Other

## 2014-06-16 DIAGNOSIS — D46C Myelodysplastic syndrome with isolated del(5q) chromosomal abnormality: Secondary | ICD-10-CM

## 2014-06-16 DIAGNOSIS — D649 Anemia, unspecified: Secondary | ICD-10-CM

## 2014-06-16 LAB — HOLD TUBE, BLOOD BANK

## 2014-06-16 LAB — CBC WITH DIFFERENTIAL/PLATELET
BASO%: 1.4 % (ref 0.0–2.0)
Basophils Absolute: 0 10*3/uL (ref 0.0–0.1)
EOS%: 6.3 % (ref 0.0–7.0)
Eosinophils Absolute: 0.2 10*3/uL (ref 0.0–0.5)
HCT: 26.8 % — ABNORMAL LOW (ref 38.4–49.9)
HGB: 8.8 g/dL — ABNORMAL LOW (ref 13.0–17.1)
LYMPH%: 53.3 % — AB (ref 14.0–49.0)
MCH: 37.4 pg — ABNORMAL HIGH (ref 27.2–33.4)
MCHC: 32.7 g/dL (ref 32.0–36.0)
MCV: 114.3 fL — AB (ref 79.3–98.0)
MONO#: 0.4 10*3/uL (ref 0.1–0.9)
MONO%: 14.2 % — ABNORMAL HIGH (ref 0.0–14.0)
NEUT#: 0.7 10*3/uL — ABNORMAL LOW (ref 1.5–6.5)
NEUT%: 24.8 % — ABNORMAL LOW (ref 39.0–75.0)
Platelets: 104 10*3/uL — ABNORMAL LOW (ref 140–400)
RBC: 2.34 10*6/uL — ABNORMAL LOW (ref 4.20–5.82)
RDW: 24.5 % — ABNORMAL HIGH (ref 11.0–14.6)
WBC: 2.8 10*3/uL — AB (ref 4.0–10.3)
lymph#: 1.5 10*3/uL (ref 0.9–3.3)

## 2014-06-23 ENCOUNTER — Encounter: Payer: Self-pay | Admitting: Hematology and Oncology

## 2014-06-23 ENCOUNTER — Telehealth: Payer: Self-pay | Admitting: *Deleted

## 2014-06-23 ENCOUNTER — Other Ambulatory Visit (HOSPITAL_BASED_OUTPATIENT_CLINIC_OR_DEPARTMENT_OTHER): Payer: Medicare Other

## 2014-06-23 DIAGNOSIS — D649 Anemia, unspecified: Secondary | ICD-10-CM

## 2014-06-23 DIAGNOSIS — D46C Myelodysplastic syndrome with isolated del(5q) chromosomal abnormality: Secondary | ICD-10-CM

## 2014-06-23 LAB — HOLD TUBE, BLOOD BANK

## 2014-06-23 LAB — CBC WITH DIFFERENTIAL/PLATELET
BASO%: 0.8 % (ref 0.0–2.0)
Basophils Absolute: 0 10*3/uL (ref 0.0–0.1)
EOS%: 7.9 % — AB (ref 0.0–7.0)
Eosinophils Absolute: 0.2 10*3/uL (ref 0.0–0.5)
HCT: 28.1 % — ABNORMAL LOW (ref 38.4–49.9)
HGB: 9.3 g/dL — ABNORMAL LOW (ref 13.0–17.1)
LYMPH%: 49.4 % — ABNORMAL HIGH (ref 14.0–49.0)
MCH: 38.5 pg — AB (ref 27.2–33.4)
MCHC: 33 g/dL (ref 32.0–36.0)
MCV: 116.5 fL — ABNORMAL HIGH (ref 79.3–98.0)
MONO#: 0.4 10*3/uL (ref 0.1–0.9)
MONO%: 12.4 % (ref 0.0–14.0)
NEUT#: 0.9 10*3/uL — ABNORMAL LOW (ref 1.5–6.5)
NEUT%: 29.5 % — ABNORMAL LOW (ref 39.0–75.0)
Platelets: 100 10*3/uL — ABNORMAL LOW (ref 140–400)
RBC: 2.41 10*6/uL — AB (ref 4.20–5.82)
RDW: 23.8 % — AB (ref 11.0–14.6)
WBC: 2.9 10*3/uL — ABNORMAL LOW (ref 4.0–10.3)
lymph#: 1.4 10*3/uL (ref 0.9–3.3)

## 2014-06-23 NOTE — Telephone Encounter (Signed)
Message copied by Patton Salles on Tue Jun 23, 2014 12:03 PM ------      Message from: [facility], [place]      Created: Tue Jun 23, 2014 11:36 AM      Regarding: cbc       pls let daughter know ANC almost 1.0      Continue to hold Revlimid      ----- Message -----         From: [facility]         Sent: 06/23/2014  11:18 AM           To: Heath Lark, MD                   ------

## 2014-06-23 NOTE — Telephone Encounter (Signed)
Patient and daughter notified of results and to hold revlimid

## 2014-06-30 ENCOUNTER — Telehealth: Payer: Self-pay | Admitting: *Deleted

## 2014-06-30 ENCOUNTER — Other Ambulatory Visit (HOSPITAL_BASED_OUTPATIENT_CLINIC_OR_DEPARTMENT_OTHER): Payer: Medicare Other

## 2014-06-30 DIAGNOSIS — D649 Anemia, unspecified: Secondary | ICD-10-CM

## 2014-06-30 LAB — CBC WITH DIFFERENTIAL/PLATELET
BASO%: 2 % (ref 0.0–2.0)
Basophils Absolute: 0 10*3/uL (ref 0.0–0.1)
EOS ABS: 0.1 10*3/uL (ref 0.0–0.5)
EOS%: 5.6 % (ref 0.0–7.0)
HCT: 28.4 % — ABNORMAL LOW (ref 38.4–49.9)
HGB: 9.2 g/dL — ABNORMAL LOW (ref 13.0–17.1)
LYMPH%: 46.8 % (ref 14.0–49.0)
MCH: 38.2 pg — ABNORMAL HIGH (ref 27.2–33.4)
MCHC: 32.5 g/dL (ref 32.0–36.0)
MCV: 117.8 fL — AB (ref 79.3–98.0)
MONO#: 0.2 10*3/uL (ref 0.1–0.9)
MONO%: 7.6 % (ref 0.0–14.0)
NEUT#: 0.9 10*3/uL — ABNORMAL LOW (ref 1.5–6.5)
NEUT%: 38 % — ABNORMAL LOW (ref 39.0–75.0)
Platelets: 78 10*3/uL — ABNORMAL LOW (ref 140–400)
RBC: 2.41 10*6/uL — AB (ref 4.20–5.82)
RDW: 21.8 % — AB (ref 11.0–14.6)
WBC: 2.3 10*3/uL — ABNORMAL LOW (ref 4.0–10.3)
lymph#: 1.1 10*3/uL (ref 0.9–3.3)

## 2014-06-30 NOTE — Telephone Encounter (Signed)
Dr. Alvy Bimler reveiwed pt's CBC and instructed for pt to continue to hold his Revlimid this week.  Return in one week as scheduled for lab.  Instructions and copy of cbc results given to pt and daughter in lobby.  They verbalized understanding.

## 2014-07-01 LAB — HOLD TUBE, BLOOD BANK

## 2014-07-02 ENCOUNTER — Telehealth: Payer: Self-pay | Admitting: *Deleted

## 2014-07-02 NOTE — Telephone Encounter (Signed)
CBC from 8/05 received from [place] [facility] [place].  Reveiwed by Dr. Alvy Bimler and she instructs for pt to resume Revlmid 2.5 mg once weekly on Fridays after Dialysis.  Continue weeky labs.  Left VM for pt to return nurse's call.

## 2014-07-03 ENCOUNTER — Telehealth: Payer: Self-pay | Admitting: *Deleted

## 2014-07-03 NOTE — Telephone Encounter (Signed)
Instructed pt to resume taking Revlmid 2.5 mg tablet today after dialysis, once weekly on Fridays.  Continue weekly lab work.  He verbalized understanding.

## 2014-07-03 NOTE — Telephone Encounter (Signed)
Informed pt's daughter of Dr. Calton Dach instructions for pt to resume Revlmid 2.5 mg once weekly on Fridays after dialysis.  Pt was already in informed.  Continue weekly lab for CBC.  She verbalized understanding.

## 2014-07-07 ENCOUNTER — Other Ambulatory Visit (HOSPITAL_BASED_OUTPATIENT_CLINIC_OR_DEPARTMENT_OTHER): Payer: Medicare Other

## 2014-07-07 ENCOUNTER — Telehealth: Payer: Self-pay | Admitting: *Deleted

## 2014-07-07 DIAGNOSIS — D649 Anemia, unspecified: Secondary | ICD-10-CM

## 2014-07-07 DIAGNOSIS — D46C Myelodysplastic syndrome with isolated del(5q) chromosomal abnormality: Secondary | ICD-10-CM

## 2014-07-07 LAB — CBC WITH DIFFERENTIAL/PLATELET
BASO%: 0.6 % (ref 0.0–2.0)
Basophils Absolute: 0 10*3/uL (ref 0.0–0.1)
EOS%: 6.6 % (ref 0.0–7.0)
Eosinophils Absolute: 0.2 10*3/uL (ref 0.0–0.5)
HCT: 29.3 % — ABNORMAL LOW (ref 38.4–49.9)
HGB: 9.5 g/dL — ABNORMAL LOW (ref 13.0–17.1)
LYMPH#: 1.1 10*3/uL (ref 0.9–3.3)
LYMPH%: 42.5 % (ref 14.0–49.0)
MCH: 38.4 pg — ABNORMAL HIGH (ref 27.2–33.4)
MCHC: 32.4 g/dL (ref 32.0–36.0)
MCV: 118.4 fL — ABNORMAL HIGH (ref 79.3–98.0)
MONO#: 0.3 10*3/uL (ref 0.1–0.9)
MONO%: 11.6 % (ref 0.0–14.0)
NEUT#: 1 10*3/uL — ABNORMAL LOW (ref 1.5–6.5)
NEUT%: 38.7 % — AB (ref 39.0–75.0)
Platelets: 89 10*3/uL — ABNORMAL LOW (ref 140–400)
RBC: 2.48 10*6/uL — ABNORMAL LOW (ref 4.20–5.82)
RDW: 20.4 % — ABNORMAL HIGH (ref 11.0–14.6)
WBC: 2.5 10*3/uL — AB (ref 4.0–10.3)

## 2014-07-07 LAB — HOLD TUBE, BLOOD BANK

## 2014-07-07 NOTE — Telephone Encounter (Signed)
Left message with instructions below.

## 2014-07-07 NOTE — Telephone Encounter (Signed)
Message copied by Patton Salles on Tue Jul 07, 2014 12:07 PM ------      Message from: [facility], [place]: Tue Jul 07, 2014 11:20 AM      Regarding: cbc       Labs stable.      Continue revlimid 2.5 mg once a week      ----- Message -----         From: [facility]         Sent: 07/07/2014  11:09 AM           To: Heath Lark, MD                   ------

## 2014-07-14 ENCOUNTER — Other Ambulatory Visit (HOSPITAL_BASED_OUTPATIENT_CLINIC_OR_DEPARTMENT_OTHER): Payer: Medicare Other

## 2014-07-14 ENCOUNTER — Telehealth: Payer: Self-pay | Admitting: *Deleted

## 2014-07-14 DIAGNOSIS — D649 Anemia, unspecified: Secondary | ICD-10-CM

## 2014-07-14 DIAGNOSIS — D46C Myelodysplastic syndrome with isolated del(5q) chromosomal abnormality: Secondary | ICD-10-CM

## 2014-07-14 LAB — CBC WITH DIFFERENTIAL/PLATELET
BASO%: 0.6 % (ref 0.0–2.0)
BASOS ABS: 0 10*3/uL (ref 0.0–0.1)
EOS ABS: 0.2 10*3/uL (ref 0.0–0.5)
EOS%: 6.8 % (ref 0.0–7.0)
HCT: 29.8 % — ABNORMAL LOW (ref 38.4–49.9)
HEMOGLOBIN: 9.7 g/dL — AB (ref 13.0–17.1)
LYMPH#: 1 10*3/uL (ref 0.9–3.3)
LYMPH%: 44.7 % (ref 14.0–49.0)
MCH: 38.4 pg — ABNORMAL HIGH (ref 27.2–33.4)
MCHC: 32.4 g/dL (ref 32.0–36.0)
MCV: 118.5 fL — AB (ref 79.3–98.0)
MONO#: 0.4 10*3/uL (ref 0.1–0.9)
MONO%: 15.8 % — ABNORMAL HIGH (ref 0.0–14.0)
NEUT%: 32.1 % — ABNORMAL LOW (ref 39.0–75.0)
NEUTROS ABS: 0.8 10*3/uL — AB (ref 1.5–6.5)
Platelets: 97 10*3/uL — ABNORMAL LOW (ref 140–400)
RBC: 2.52 10*6/uL — ABNORMAL LOW (ref 4.20–5.82)
RDW: 18.7 % — AB (ref 11.0–14.6)
WBC: 2.3 10*3/uL — ABNORMAL LOW (ref 4.0–10.3)

## 2014-07-14 LAB — HOLD TUBE, BLOOD BANK

## 2014-07-14 NOTE — Telephone Encounter (Signed)
Instructed daughter for pt to hold Revlimid this week until his next appt in one week for lab and Dr. Alvy Bimler.  She verbalized understanding.  She will not be able to come on office visit w/ pt next week but she asks Dr. Alvy Bimler to consider adding prophylactic antibiotic so pt might be able to continue revlimid next week.  She says pt seems to feel a lot better with improved hgb on Revlimid.

## 2014-07-17 ENCOUNTER — Encounter: Payer: Self-pay | Admitting: Hematology and Oncology

## 2014-07-21 ENCOUNTER — Ambulatory Visit (HOSPITAL_BASED_OUTPATIENT_CLINIC_OR_DEPARTMENT_OTHER): Payer: Medicare Other | Admitting: Hematology and Oncology

## 2014-07-21 ENCOUNTER — Telehealth: Payer: Self-pay | Admitting: Hematology and Oncology

## 2014-07-21 ENCOUNTER — Encounter: Payer: Self-pay | Admitting: Hematology and Oncology

## 2014-07-21 ENCOUNTER — Other Ambulatory Visit (HOSPITAL_BASED_OUTPATIENT_CLINIC_OR_DEPARTMENT_OTHER): Payer: Medicare Other

## 2014-07-21 VITALS — BP 149/54 | HR 72 | Temp 97.4°F | Resp 17 | Ht 68.0 in | Wt 174.8 lb

## 2014-07-21 DIAGNOSIS — D696 Thrombocytopenia, unspecified: Secondary | ICD-10-CM

## 2014-07-21 DIAGNOSIS — D6959 Other secondary thrombocytopenia: Secondary | ICD-10-CM

## 2014-07-21 DIAGNOSIS — Z992 Dependence on renal dialysis: Secondary | ICD-10-CM

## 2014-07-21 DIAGNOSIS — D630 Anemia in neoplastic disease: Secondary | ICD-10-CM

## 2014-07-21 DIAGNOSIS — T451X5A Adverse effect of antineoplastic and immunosuppressive drugs, initial encounter: Secondary | ICD-10-CM

## 2014-07-21 DIAGNOSIS — N186 End stage renal disease: Secondary | ICD-10-CM

## 2014-07-21 DIAGNOSIS — D46C Myelodysplastic syndrome with isolated del(5q) chromosomal abnormality: Secondary | ICD-10-CM

## 2014-07-21 DIAGNOSIS — D701 Agranulocytosis secondary to cancer chemotherapy: Secondary | ICD-10-CM

## 2014-07-21 DIAGNOSIS — D631 Anemia in chronic kidney disease: Secondary | ICD-10-CM

## 2014-07-21 DIAGNOSIS — D649 Anemia, unspecified: Secondary | ICD-10-CM

## 2014-07-21 DIAGNOSIS — N039 Chronic nephritic syndrome with unspecified morphologic changes: Secondary | ICD-10-CM

## 2014-07-21 DIAGNOSIS — D72819 Decreased white blood cell count, unspecified: Secondary | ICD-10-CM

## 2014-07-21 LAB — CBC WITH DIFFERENTIAL/PLATELET
BASO%: 1.3 % (ref 0.0–2.0)
Basophils Absolute: 0 10*3/uL (ref 0.0–0.1)
EOS ABS: 0.1 10*3/uL (ref 0.0–0.5)
EOS%: 5.9 % (ref 0.0–7.0)
HEMATOCRIT: 31.4 % — AB (ref 38.4–49.9)
HGB: 10.2 g/dL — ABNORMAL LOW (ref 13.0–17.1)
LYMPH%: 46.1 % (ref 14.0–49.0)
MCH: 38.4 pg — ABNORMAL HIGH (ref 27.2–33.4)
MCHC: 32.5 g/dL (ref 32.0–36.0)
MCV: 118.2 fL — ABNORMAL HIGH (ref 79.3–98.0)
MONO#: 0.4 10*3/uL (ref 0.1–0.9)
MONO%: 18.7 % — ABNORMAL HIGH (ref 0.0–14.0)
NEUT#: 0.6 10*3/uL — ABNORMAL LOW (ref 1.5–6.5)
NEUT%: 28 % — ABNORMAL LOW (ref 39.0–75.0)
PLATELETS: 72 10*3/uL — AB (ref 140–400)
RBC: 2.66 10*6/uL — ABNORMAL LOW (ref 4.20–5.82)
RDW: 17.3 % — ABNORMAL HIGH (ref 11.0–14.6)
WBC: 2.1 10*3/uL — AB (ref 4.0–10.3)
lymph#: 1 10*3/uL (ref 0.9–3.3)

## 2014-07-21 NOTE — Telephone Encounter (Signed)
gv and printed appt sched and avs for pt for Sept thru [facility]

## 2014-07-21 NOTE — Telephone Encounter (Signed)
No proxy Access noted for MyChart.  Release of information noted.

## 2014-07-21 NOTE — Telephone Encounter (Signed)
gv and printed appt sched and avs fo rpt for Sept thru NOV.Andrew KitchenMarland [name]

## 2014-07-21 NOTE — Progress Notes (Signed)
[place] OFFICE PROGRESS NOTE  Patient Care Team: Redmond School, MD as PCP - General (Internal Medicine) Daneil Dolin, MD (Gastroenterology) Geoffry Paradise, MD (Nephrology) Estanislado Emms, MD as Consulting Physician (Nephrology) Baird Cancer, PA-C as Physician Assistant (Physician Assistant) Heath Lark, MD as Consulting Physician (Hematology and Oncology)  SUMMARY OF ONCOLOGIC HISTORY: He has multiple advanced medical problems including underlying coronary disease, end-stage renal disease on dialysis, multifactorial anemia related to renal disease and an underlying myelodysplastic syndrome  During a July 2014 hospitalization, he was transfusion dependent for blood despite Aranesp support. Hematologic profile suggested 5Q minus syndrome. He agreed to a bone marrow biopsy on August 5. Findings were consistent with a myelodysplastic syndrome but the 5Q minus deletion was not seen on FISH studies. He was started on a trial of Revlimid at a low dose of 5 mg 3 times weekly after each dialysis procedure. He was only on the drug for about 2 weeks when he was readmitted to the hospital on September 11 with profound pancytopenia: Hemoglobin 7 g, white count 1000, platelets 33,000. Revlimid was stopped. His blood counts have steadily improved. He has not required any additional transfusions since the September hospitalization and his hemoglobin has steadily come up to a peak  value of 13 g recorded on 11/25/2013.Marland Kitchen His white blood count has normalized. His platelet count has recovered to over 100,000. He continue on dialysis 3 times a week on Mondays, Wednesdays and Fridays. On 04/28/2014, repeat bone marrow aspirate and biopsy confirmed persistent MDS. On 05/08/2014, he was started on Revlimid 5 mg weekly. On 05/16/6014, Revlimid was placed on hold due to severe pancytopenia On 07/03/14: Revlimid was restarted at 2.5 mg weekly PO On 07/14/14: Revlimid was placed on hold again due to severe  pancytopenia  INTERVAL HISTORY: Please see below for problem oriented charting. He feels well without any side effects from treatment. Denies recent infection. He bruises easily. The patient denies any recent signs or symptoms of bleeding such as spontaneous epistaxis, hematuria or hematochezia.   REVIEW OF SYSTEMS:   Constitutional: Denies fevers, chills or abnormal weight loss Eyes: Denies blurriness of vision Ears, nose, mouth, throat, and face: Denies mucositis or sore throat Respiratory: Denies cough, dyspnea or wheezes Cardiovascular: Denies palpitation, chest discomfort or lower extremity swelling Gastrointestinal:  Denies nausea, heartburn or change in bowel habits Skin: Denies abnormal skin rashes Lymphatics: Denies new lymphadenopathy  Neurological:Denies numbness, tingling or new weaknesses Behavioral/Psych: Mood is stable, no new changes  All other systems were reviewed with the patient and are negative.  I have reviewed the past medical history, past surgical history, social history and family history with the patient and they are unchanged from previous note.  ALLERGIES:  has No Known Allergies.  MEDICATIONS:  Current Outpatient Prescriptions  Medication Sig Dispense Refill  . acetaminophen (TYLENOL) 500 MG tablet Take 500 mg by mouth every 6 (six) hours as needed. For pain/headache      . albuterol (PROVENTIL HFA;VENTOLIN HFA) 108 (90 BASE) MCG/ACT inhaler Inhale 2 puffs into the lungs every 6 (six) hours as needed for wheezing or shortness of breath.  1 Inhaler  0  . allopurinol (ZYLOPRIM) 100 MG tablet Take 200 mg by mouth at bedtime.       Marland Kitchen aspirin EC 81 MG tablet Take 81 mg by mouth at bedtime.       Marland Kitchen atorvastatin (LIPITOR) 10 MG tablet Take 10 mg by mouth every morning.       Marland Kitchen  B Complex-C-Folic Acid (DIALYVITE TABLET) TABS Take 1 tablet by mouth at bedtime.       . calcium carbonate (TUMS - DOSED IN MG ELEMENTAL CALCIUM) 500 MG chewable tablet Chew 1 tablet  by mouth daily as needed for heartburn. For heartburn      . cinacalcet (SENSIPAR) 30 MG tablet Take 30 mg by mouth daily with breakfast.       . colchicine (COLCRYS) 0.6 MG tablet Take 0.6 mg by mouth 2 (two) times daily as needed. for gout      . docusate sodium (COLACE) 100 MG capsule Take 100 mg by mouth daily.      Marland Kitchen gabapentin (NEURONTIN) 100 MG capsule Take 100 mg by mouth 2 (two) times daily. Takes 1 in the morning and 2 at night      . midodrine (PROAMATINE) 10 MG tablet Take 20 mg by mouth 3 (three) times daily.       Marland Kitchen omeprazole (PRILOSEC) 40 MG capsule Take 40 mg by mouth 2 (two) times daily. For reflux      . polyethylene glycol powder (MIRALAX) powder Take 17 g by mouth at bedtime.       . sevelamer (RENVELA) 800 MG tablet Take 2,400 mg by mouth 3 (three) times daily with meals. 4 tabs 3 x daily and 2 with snack       No current facility-administered medications for this visit.   Facility-Administered Medications Ordered in Other Visits  Medication Dose Route Frequency Provider Last Rate Last Dose  . methylPREDNISolone sodium succinate (SOLU-MEDROL) 125 mg/2 mL injection 60 mg  60 mg Intravenous Once Sol Blazing, MD        PHYSICAL EXAMINATION: ECOG PERFORMANCE STATUS: 0 - Asymptomatic  Filed Vitals:   07/21/14 1055  BP: 149/54  Pulse: 72  Temp: 97.4 F (36.3 C)  Resp: 17   Filed Weights   07/21/14 1055  Weight: 174 lb 12.8 oz (79.289 kg)    GENERAL:alert, no distress and comfortable SKIN: skin color, texture, turgor are normal, no rashes or significant lesions EYES: normal, Conjunctiva are pink and non-injected, sclera clear Musculoskeletal:no cyanosis of digits and no clubbing  NEURO: alert & oriented x 3 with fluent speech, no focal motor/sensory deficits  LABORATORY DATA:  I have reviewed the data as listed    Component Value Date/Time   NA 142 08/26/2013 1119   NA 135 08/14/2013 0640   K 4.1 08/26/2013 1119   K 3.9 08/14/2013 0640   K 6.0 03/23/2011  0912   CL 95* 08/14/2013 0640   CO2 33* 08/26/2013 1119   CO2 31 08/14/2013 0640   GLUCOSE 104 08/26/2013 1119   GLUCOSE 91 08/14/2013 0640   BUN 26.8* 08/26/2013 1119   BUN 21 08/14/2013 0640   CREATININE 5.3* 08/26/2013 1119   CREATININE 3.00* 08/14/2013 0640   CALCIUM 8.1* 08/26/2013 1119   CALCIUM 8.0* 08/14/2013 0640   CALCIUM 7.8* 03/13/2012 0527   PROT 6.4 08/26/2013 1119   PROT 5.5* 06/27/2013 0540   ALBUMIN 3.1* 08/26/2013 1119   ALBUMIN 2.5* 07/02/2013 0646   AST 29 08/26/2013 1119   AST 31 06/27/2013 0540   ALT 23 08/26/2013 1119   ALT 18 06/27/2013 0540   ALKPHOS 130 08/26/2013 1119   ALKPHOS 97 06/27/2013 0540   ALKPHOS 103 03/23/2011 0912   BILITOT 0.63 08/26/2013 1119   BILITOT 0.9 06/27/2013 0540   GFRNONAA 18* 08/14/2013 0640   GFRAA 20* 08/14/2013 0640    No results  found for this basename: SPEP,  UPEP,   kappa and lambda light chains    Lab Results  Component Value Date   WBC 2.1* 07/21/2014   NEUTROABS 0.6* 07/21/2014   HGB 10.2* 07/21/2014   HCT 31.4* 07/21/2014   MCV 118.2* 07/21/2014   PLT 72* 07/21/2014      Chemistry      Component Value Date/Time   NA 142 08/26/2013 1119   NA 135 08/14/2013 0640   K 4.1 08/26/2013 1119   K 3.9 08/14/2013 0640   K 6.0 03/23/2011 0912   CL 95* 08/14/2013 0640   CO2 33* 08/26/2013 1119   CO2 31 08/14/2013 0640   BUN 26.8* 08/26/2013 1119   BUN 21 08/14/2013 0640   CREATININE 5.3* 08/26/2013 1119   CREATININE 3.00* 08/14/2013 0640      Component Value Date/Time   CALCIUM 8.1* 08/26/2013 1119   CALCIUM 8.0* 08/14/2013 0640   CALCIUM 7.8* 03/13/2012 0527   ALKPHOS 130 08/26/2013 1119   ALKPHOS 97 06/27/2013 0540   ALKPHOS 103 03/23/2011 0912   AST 29 08/26/2013 1119   AST 31 06/27/2013 0540   ALT 23 08/26/2013 1119   ALT 18 06/27/2013 0540   BILITOT 0.63 08/26/2013 1119   BILITOT 0.9 06/27/2013 0540     ASSESSMENT & PLAN:  MDS (myelodysplastic syndrome) with 5q deletion Previously, he had excellent response to Revlimid however it was discontinued due to  profound pancytopenia and significant toxicity. I started him on only Revlimid 5 mg once a week but his treatment has to be put on hold due to severe pancytopenia. Subsequently, the dose of Revlimid was reduced at 2.5 mg once a week and he only got 2 doses before he got significantly pancytopenic again. The patient will return here on a weekly basis to get his blood work be checked. Once his [place] is greater than 1000, we will resume Revlimid at 2.5 mg once a week on Fridays after his dialysis. I have discussed with the patient and his daughter are significantly about adding G-CSF to see whether in combination with erythropoietin stimulating agent, would improve his anemia. This would also prevent from life-threatening infection and may allow the addition of Revlimid in the future. The daughter will call me and give me an answer by the end of the week and I will try to obtain insurance preauthorization to give him G-CSF weekly when he gets blood work checked here.    Thrombocytopenia This is due to underlying bone marrow disorder and side effects of Revlimid. It is mild and slightly worse compared from previous platelet count. The patient denies recent history of bleeding such as epistaxis, hematuria or hematochezia. He is asymptomatic from the thrombocytopenia. I will observe for now.  he does not require transfusion now.         Leukopenia due to antineoplastic chemotherapy This is likely due to recent treatment. I will observe for now.  We will continue to hold chemotherapy until [place] greater than 1000. The risk, benefits, side effects of G-CSF was discussed extensively with her daughter.     Anemia in neoplastic disease This is likely anemia of chronic disease. The patient denies recent history of bleeding such as epistaxis, hematuria or hematochezia. He is asymptomatic from the anemia. We will observe for now.  He does not require transfusion now.  We discussed establishing transfusion  threshold to give him 1 unit of blood each time if his hemoglobin dropped to less than 8 g.  ESRD (end stage renal disease) He will continue dialysis on Mondays, Wednesdays and Fridays.      No orders of the defined types were placed in this encounter.   All questions were answered. The patient knows to call the clinic with any problems, questions or concerns. No barriers to learning was detected. I spent 30 minutes counseling the patient face to face. The total time spent in the appointment was 40 minutes and more than 50% was on counseling and review of test results     [facility], [place], MD 07/21/2014 8:17 PM

## 2014-07-21 NOTE — Assessment & Plan Note (Signed)
This is due to underlying bone marrow disorder and side effects of Revlimid. It is mild and slightly worse compared from previous platelet count. The patient denies recent history of bleeding such as epistaxis, hematuria or hematochezia. He is asymptomatic from the thrombocytopenia. I will observe for now.  he does not require transfusion now.

## 2014-07-21 NOTE — Assessment & Plan Note (Signed)
Previously, he had excellent response to Revlimid however it was discontinued due to profound pancytopenia and significant toxicity. I started him on only Revlimid 5 mg once a week but his treatment has to be put on hold due to severe pancytopenia. Subsequently, the dose of Revlimid was reduced at 2.5 mg once a week and he only got 2 doses before he got significantly pancytopenic again. The patient will return here on a weekly basis to get his blood work be checked. Once his [place] is greater than 1000, we will resume Revlimid at 2.5 mg once a week on Fridays after his dialysis. I have discussed with the patient and his daughter are significantly about adding G-CSF to see whether in combination with erythropoietin stimulating agent, would improve his anemia. This would also prevent from life-threatening infection and may allow the addition of Revlimid in the future. The daughter will call me and give me an answer by the end of the week and I will try to obtain insurance preauthorization to give him G-CSF weekly when he gets blood work checked here.

## 2014-07-21 NOTE — Assessment & Plan Note (Signed)
He will continue dialysis on Mondays, Wednesdays and Fridays.

## 2014-07-21 NOTE — Assessment & Plan Note (Signed)
This is likely due to recent treatment. I will observe for now.  We will continue to hold chemotherapy until [place] greater than 1000. The risk, benefits, side effects of G-CSF was discussed extensively with her daughter.

## 2014-07-21 NOTE — Assessment & Plan Note (Signed)
This is likely anemia of chronic disease. The patient denies recent history of bleeding such as epistaxis, hematuria or hematochezia. He is asymptomatic from the anemia. We will observe for now.  He does not require transfusion now.  We discussed establishing transfusion threshold to give him 1 unit of blood each time if his hemoglobin dropped to less than 8 g.

## 2014-07-24 ENCOUNTER — Other Ambulatory Visit: Payer: Self-pay | Admitting: Hematology and Oncology

## 2014-07-24 ENCOUNTER — Telehealth: Payer: Self-pay | Admitting: *Deleted

## 2014-07-24 NOTE — Telephone Encounter (Signed)
Daughter left VM states pt would like to proceed w/ shot for WBC as suggested by Dr. Alvy Bimler.  She asks if pt can get injection when he comes in next week on 9/01 for lab?

## 2014-07-24 NOTE — Telephone Encounter (Signed)
I asked for preauth and created supportive therapy plan. He gets Granix daily X 2 days whenever his ANC <1500

## 2014-07-27 ENCOUNTER — Telehealth: Payer: Self-pay | Admitting: Hematology and Oncology

## 2014-07-27 ENCOUNTER — Other Ambulatory Visit: Payer: Self-pay | Admitting: *Deleted

## 2014-07-27 NOTE — Telephone Encounter (Signed)
lvm for pt regarding to all appts. °

## 2014-07-28 ENCOUNTER — Ambulatory Visit (HOSPITAL_BASED_OUTPATIENT_CLINIC_OR_DEPARTMENT_OTHER): Payer: Medicare Other

## 2014-07-28 ENCOUNTER — Other Ambulatory Visit (HOSPITAL_BASED_OUTPATIENT_CLINIC_OR_DEPARTMENT_OTHER): Payer: Medicare Other

## 2014-07-28 DIAGNOSIS — D46C Myelodysplastic syndrome with isolated del(5q) chromosomal abnormality: Secondary | ICD-10-CM

## 2014-07-28 DIAGNOSIS — D649 Anemia, unspecified: Secondary | ICD-10-CM

## 2014-07-28 DIAGNOSIS — T451X5A Adverse effect of antineoplastic and immunosuppressive drugs, initial encounter: Secondary | ICD-10-CM

## 2014-07-28 DIAGNOSIS — D701 Agranulocytosis secondary to cancer chemotherapy: Secondary | ICD-10-CM

## 2014-07-28 DIAGNOSIS — Z23 Encounter for immunization: Secondary | ICD-10-CM

## 2014-07-28 LAB — CBC WITH DIFFERENTIAL/PLATELET
BASO%: 1.2 % (ref 0.0–2.0)
BASOS ABS: 0 10*3/uL (ref 0.0–0.1)
EOS%: 5.9 % (ref 0.0–7.0)
Eosinophils Absolute: 0.1 10*3/uL (ref 0.0–0.5)
HEMATOCRIT: 35.6 % — AB (ref 38.4–49.9)
HEMOGLOBIN: 11.4 g/dL — AB (ref 13.0–17.1)
LYMPH%: 46.6 % (ref 14.0–49.0)
MCH: 37.5 pg — ABNORMAL HIGH (ref 27.2–33.4)
MCHC: 31.9 g/dL — AB (ref 32.0–36.0)
MCV: 117.6 fL — ABNORMAL HIGH (ref 79.3–98.0)
MONO#: 0.4 10*3/uL (ref 0.1–0.9)
MONO%: 14.6 % — ABNORMAL HIGH (ref 0.0–14.0)
NEUT#: 0.8 10*3/uL — ABNORMAL LOW (ref 1.5–6.5)
NEUT%: 31.7 % — AB (ref 39.0–75.0)
Platelets: 101 10*3/uL — ABNORMAL LOW (ref 140–400)
RBC: 3.03 10*6/uL — ABNORMAL LOW (ref 4.20–5.82)
RDW: 16.5 % — ABNORMAL HIGH (ref 11.0–14.6)
WBC: 2.5 10*3/uL — AB (ref 4.0–10.3)
lymph#: 1.2 10*3/uL (ref 0.9–3.3)

## 2014-07-28 LAB — HOLD TUBE, BLOOD BANK

## 2014-07-28 MED ORDER — FILGRASTIM 300 MCG/0.5ML IJ SOLN: 300.0000 ug | Freq: Every day | INTRAMUSCULAR | Status: DC

## 2014-07-28 MED ORDER — TBO-FILGRASTIM 300 MCG/0.5ML ~~LOC~~ SOSY
300.0000 ug | PREFILLED_SYRINGE | Freq: Once | SUBCUTANEOUS | Status: AC
  Administered 2014-07-28: 300 ug via SUBCUTANEOUS
  Filled 2014-07-28: qty 0.5

## 2014-07-28 MED ORDER — INFLUENZA VAC SPLIT QUAD 0.5 ML IM SUSY
0.5000 mL | PREFILLED_SYRINGE | Freq: Once | INTRAMUSCULAR | Status: AC
  Administered 2014-07-28: 0.5 mL via INTRAMUSCULAR
  Filled 2014-07-28: qty 0.5

## 2014-07-28 NOTE — Patient Instructions (Signed)
Influenza Virus Vaccine (Flucelvax) What is this medicine? INFLUENZA VIRUS VACCINE (in floo EN zuh VAHY ruhs vak SEEN) helps to reduce the risk of getting influenza also known as the flu. The vaccine only helps protect you against some strains of the flu. This medicine may be used for other purposes; ask your health care provider or pharmacist if you have questions. COMMON BRAND NAME(S): FLUCELVAX What should I tell my health care provider before I take this medicine? They need to know if you have any of these conditions: -bleeding disorder like hemophilia -fever or infection -Guillain-Barre syndrome or other neurological problems -immune system problems -infection with the human immunodeficiency virus (HIV) or AIDS -low blood platelet counts -multiple sclerosis -an unusual or allergic reaction to influenza virus vaccine, other medicines, foods, dyes or preservatives -pregnant or trying to get pregnant -breast-feeding How should I use this medicine? This vaccine is for injection into a muscle. It is given by a health care professional. A copy of Vaccine Information Statements will be given before each vaccination. Read this sheet carefully each time. The sheet may change frequently. Talk to your pediatrician regarding the use of this medicine in children. Special care may be needed. Overdosage: If you think you've taken too much of this medicine contact a poison control center or emergency room at once. Overdosage: If you think you have taken too much of this medicine contact a poison control center or emergency room at once. NOTE: This medicine is only for you. Do not share this medicine with others. What if I miss a dose? This does not apply. What may interact with this medicine? -chemotherapy or radiation therapy -medicines that lower your immune system like etanercept, anakinra, infliximab, and adalimumab -medicines that treat or prevent blood clots like  warfarin -phenytoin -steroid medicines like prednisone or cortisone -theophylline -vaccines This list may not describe all possible interactions. Give your health care provider a list of all the medicines, herbs, non-prescription drugs, or dietary supplements you use. Also tell them if you smoke, drink alcohol, or use illegal drugs. Some items may interact with your medicine. What should I watch for while using this medicine? Report any side effects that do not go away within 3 days to your doctor or health care professional. Call your health care provider if any unusual symptoms occur within 6 weeks of receiving this vaccine. You may still catch the flu, but the illness is not usually as bad. You cannot get the flu from the vaccine. The vaccine will not protect against colds or other illnesses that may cause fever. The vaccine is needed every year. What side effects may I notice from receiving this medicine? Side effects that you should report to your doctor or health care professional as soon as possible: -allergic reactions like skin rash, itching or hives, swelling of the face, lips, or tongue Side effects that usually do not require medical attention (Report these to your doctor or health care professional if they continue or are bothersome.): -fever -headache -muscle aches and pains -pain, tenderness, redness, or swelling at the injection site -tiredness This list may not describe all possible side effects. Call your doctor for medical advice about side effects. You may report side effects to FDA at 1-800-FDA-1088. Where should I keep my medicine? The vaccine will be given by a health care professional in a clinic, pharmacy, doctor's office, or other health care setting. You will not be given vaccine doses to store at home. NOTE: This sheet is a summary.   It may not cover all possible information. If you have questions about this medicine, talk to your doctor, pharmacist, or health care  provider.  2015, Elsevier/Gold Standard. (2011-10-25 14:06:47) Tbo-Filgrastim injection What is this medicine? TBO-FILGRASTIM (T B O fil GRA stim) is a granulocyte colony-stimulating factor that stimulates the growth of neutrophils, a type of white blood cell important in the body's fight against infection. It is used to reduce the incidence of fever and infection in patients with certain types of cancer who are receiving chemotherapy that affects the bone marrow. This medicine may be used for other purposes; ask your health care provider or pharmacist if you have questions. COMMON BRAND NAME(S): Granix What should I tell my health care provider before I take this medicine? They need to know if you have any of these conditions: -ongoing radiation therapy -sickle cell anemia -an unusual or allergic reaction to tbo-filgrastim, filgrastim, pegfilgrastim, other medicines, foods, dyes, or preservatives -pregnant or trying to get pregnant -breast-feeding How should I use this medicine? This medicine is for injection under the skin. If you get this medicine at home, you will be taught how to prepare and give this medicine. Refer to the Instructions for Use that come with your medication packaging. Use exactly as directed. Take your medicine at regular intervals. Do not take your medicine more often than directed. It is important that you put your used needles and syringes in a special sharps container. Do not put them in a trash can. If you do not have a sharps container, call your pharmacist or healthcare provider to get one. Talk to your pediatrician regarding the use of this medicine in children. Special care may be needed. Overdosage: If you think you've taken too much of this medicine contact a poison control center or emergency room at once. Overdosage: If you think you have taken too much of this medicine contact a poison control center or emergency room at once. NOTE: This medicine is only for  you. Do not share this medicine with others. What if I miss a dose? It is important not to miss your dose. Call your doctor or health care professional if you miss a dose. What may interact with this medicine? This medicine may interact with the following medications: -medicines that may cause a release of neutrophils, such as lithium This list may not describe all possible interactions. Give your health care provider a list of all the medicines, herbs, non-prescription drugs, or dietary supplements you use. Also tell them if you smoke, drink alcohol, or use illegal drugs. Some items may interact with your medicine. What should I watch for while using this medicine? You may need blood work done while you are taking this medicine. What side effects may I notice from receiving this medicine? Side effects that you should report to your doctor or health care professional as soon as possible: -allergic reactions like skin rash, itching or hives, swelling of the face, lips, or tongue -shortness of breath or breathing problems -fever -pain, redness, or irritation at site where injected -pinpoint red spots on the skin -stomach or side pain, or pain at the shoulder -swelling -tiredness -trouble passing urine Side effects that usually do not require medical attention (Report these to your doctor or health care professional if they continue or are bothersome.): -bone pain -muscle pain This list may not describe all possible side effects. Call your doctor for medical advice about side effects. You may report side effects to FDA at 1-800-FDA-1088. Where should  I keep my medicine? Keep out of the reach of children. Store in a refrigerator between 2 and 8 degrees C (36 and 46 degrees F). Keep in carton to protect from light. Throw away this medicine if it is left out of the refrigerator for more than 5 consecutive days. Throw away any unused medicine after the expiration date. NOTE: This sheet is a  summary. It may not cover all possible information. If you have questions about this medicine, talk to your doctor, pharmacist, or health care provider.  2015, Elsevier/Gold Standard. (2014-03-05 11:52:29)

## 2014-08-04 ENCOUNTER — Ambulatory Visit (HOSPITAL_BASED_OUTPATIENT_CLINIC_OR_DEPARTMENT_OTHER): Payer: Medicare Other

## 2014-08-04 ENCOUNTER — Other Ambulatory Visit: Payer: Self-pay | Admitting: Hematology and Oncology

## 2014-08-04 ENCOUNTER — Other Ambulatory Visit (HOSPITAL_BASED_OUTPATIENT_CLINIC_OR_DEPARTMENT_OTHER): Payer: Medicare Other

## 2014-08-04 VITALS — BP 150/63 | HR 74 | Temp 97.8°F

## 2014-08-04 DIAGNOSIS — D630 Anemia in neoplastic disease: Secondary | ICD-10-CM

## 2014-08-04 DIAGNOSIS — D46C Myelodysplastic syndrome with isolated del(5q) chromosomal abnormality: Secondary | ICD-10-CM

## 2014-08-04 DIAGNOSIS — T451X5A Adverse effect of antineoplastic and immunosuppressive drugs, initial encounter: Principal | ICD-10-CM

## 2014-08-04 DIAGNOSIS — D696 Thrombocytopenia, unspecified: Secondary | ICD-10-CM

## 2014-08-04 DIAGNOSIS — D72819 Decreased white blood cell count, unspecified: Secondary | ICD-10-CM

## 2014-08-04 DIAGNOSIS — D649 Anemia, unspecified: Secondary | ICD-10-CM

## 2014-08-04 DIAGNOSIS — D701 Agranulocytosis secondary to cancer chemotherapy: Secondary | ICD-10-CM

## 2014-08-04 LAB — CBC WITH DIFFERENTIAL/PLATELET
BASO%: 1.1 % (ref 0.0–2.0)
BASOS ABS: 0 10*3/uL (ref 0.0–0.1)
EOS ABS: 0.1 10*3/uL (ref 0.0–0.5)
EOS%: 6 % (ref 0.0–7.0)
HCT: 34.6 % — ABNORMAL LOW (ref 38.4–49.9)
HEMOGLOBIN: 11.2 g/dL — AB (ref 13.0–17.1)
LYMPH%: 46.4 % (ref 14.0–49.0)
MCH: 37.7 pg — AB (ref 27.2–33.4)
MCHC: 32.5 g/dL (ref 32.0–36.0)
MCV: 116.2 fL — AB (ref 79.3–98.0)
MONO#: 0.3 10*3/uL (ref 0.1–0.9)
MONO%: 14.3 % — AB (ref 0.0–14.0)
NEUT#: 0.8 10*3/uL — ABNORMAL LOW (ref 1.5–6.5)
NEUT%: 32.2 % — AB (ref 39.0–75.0)
PLATELETS: 100 10*3/uL — AB (ref 140–400)
RBC: 2.98 10*6/uL — ABNORMAL LOW (ref 4.20–5.82)
RDW: 16.2 % — AB (ref 11.0–14.6)
WBC: 2.4 10*3/uL — ABNORMAL LOW (ref 4.0–10.3)
lymph#: 1.1 10*3/uL (ref 0.9–3.3)

## 2014-08-04 LAB — HOLD TUBE, BLOOD BANK

## 2014-08-04 MED ORDER — TBO-FILGRASTIM 300 MCG/0.5ML ~~LOC~~ SOSY
300.0000 ug | PREFILLED_SYRINGE | Freq: Once | SUBCUTANEOUS | Status: AC
  Administered 2014-08-04: 300 ug via SUBCUTANEOUS
  Filled 2014-08-04: qty 0.5

## 2014-08-04 NOTE — Patient Instructions (Signed)

## 2014-08-11 ENCOUNTER — Ambulatory Visit (HOSPITAL_BASED_OUTPATIENT_CLINIC_OR_DEPARTMENT_OTHER): Payer: Medicare Other

## 2014-08-11 ENCOUNTER — Other Ambulatory Visit (HOSPITAL_BASED_OUTPATIENT_CLINIC_OR_DEPARTMENT_OTHER): Payer: Medicare Other

## 2014-08-11 VITALS — BP 103/62 | HR 92 | Temp 97.4°F

## 2014-08-11 DIAGNOSIS — D72819 Decreased white blood cell count, unspecified: Secondary | ICD-10-CM

## 2014-08-11 DIAGNOSIS — D46C Myelodysplastic syndrome with isolated del(5q) chromosomal abnormality: Secondary | ICD-10-CM

## 2014-08-11 DIAGNOSIS — D701 Agranulocytosis secondary to cancer chemotherapy: Secondary | ICD-10-CM

## 2014-08-11 DIAGNOSIS — T451X5A Adverse effect of antineoplastic and immunosuppressive drugs, initial encounter: Principal | ICD-10-CM

## 2014-08-11 DIAGNOSIS — D649 Anemia, unspecified: Secondary | ICD-10-CM

## 2014-08-11 LAB — CBC WITH DIFFERENTIAL/PLATELET
BASO%: 1.3 % (ref 0.0–2.0)
Basophils Absolute: 0 10*3/uL (ref 0.0–0.1)
EOS%: 8.8 % — ABNORMAL HIGH (ref 0.0–7.0)
Eosinophils Absolute: 0.3 10*3/uL (ref 0.0–0.5)
HEMATOCRIT: 37.3 % — AB (ref 38.4–49.9)
HGB: 11.9 g/dL — ABNORMAL LOW (ref 13.0–17.1)
LYMPH%: 44.9 % (ref 14.0–49.0)
MCH: 37.1 pg — AB (ref 27.2–33.4)
MCHC: 31.9 g/dL — AB (ref 32.0–36.0)
MCV: 116.4 fL — AB (ref 79.3–98.0)
MONO#: 0.4 10*3/uL (ref 0.1–0.9)
MONO%: 13.7 % (ref 0.0–14.0)
NEUT#: 0.9 10*3/uL — ABNORMAL LOW (ref 1.5–6.5)
NEUT%: 31.3 % — ABNORMAL LOW (ref 39.0–75.0)
Platelets: 115 10*3/uL — ABNORMAL LOW (ref 140–400)
RBC: 3.21 10*6/uL — AB (ref 4.20–5.82)
RDW: 16.5 % — ABNORMAL HIGH (ref 11.0–14.6)
WBC: 2.8 10*3/uL — AB (ref 4.0–10.3)
lymph#: 1.3 10*3/uL (ref 0.9–3.3)

## 2014-08-11 LAB — HOLD TUBE, BLOOD BANK

## 2014-08-11 MED ORDER — TBO-FILGRASTIM 300 MCG/0.5ML ~~LOC~~ SOSY
300.0000 ug | PREFILLED_SYRINGE | Freq: Once | SUBCUTANEOUS | Status: AC
  Administered 2014-08-11: 300 ug via SUBCUTANEOUS
  Filled 2014-08-11: qty 0.5

## 2014-08-11 NOTE — Patient Instructions (Signed)

## 2014-08-18 ENCOUNTER — Other Ambulatory Visit: Payer: Self-pay | Admitting: Hematology and Oncology

## 2014-08-18 ENCOUNTER — Other Ambulatory Visit (HOSPITAL_BASED_OUTPATIENT_CLINIC_OR_DEPARTMENT_OTHER): Payer: Medicare Other

## 2014-08-18 ENCOUNTER — Ambulatory Visit (HOSPITAL_BASED_OUTPATIENT_CLINIC_OR_DEPARTMENT_OTHER): Payer: Medicare Other

## 2014-08-18 VITALS — BP 147/52 | HR 82 | Temp 98.0°F

## 2014-08-18 DIAGNOSIS — D701 Agranulocytosis secondary to cancer chemotherapy: Secondary | ICD-10-CM

## 2014-08-18 DIAGNOSIS — D649 Anemia, unspecified: Secondary | ICD-10-CM

## 2014-08-18 DIAGNOSIS — T451X5A Adverse effect of antineoplastic and immunosuppressive drugs, initial encounter: Secondary | ICD-10-CM

## 2014-08-18 DIAGNOSIS — D72819 Decreased white blood cell count, unspecified: Secondary | ICD-10-CM

## 2014-08-18 DIAGNOSIS — D46C Myelodysplastic syndrome with isolated del(5q) chromosomal abnormality: Secondary | ICD-10-CM

## 2014-08-18 LAB — CBC WITH DIFFERENTIAL/PLATELET
BASO%: 0.8 % (ref 0.0–2.0)
Basophils Absolute: 0 10*3/uL (ref 0.0–0.1)
EOS%: 7.1 % — ABNORMAL HIGH (ref 0.0–7.0)
Eosinophils Absolute: 0.2 10*3/uL (ref 0.0–0.5)
HCT: 36.8 % — ABNORMAL LOW (ref 38.4–49.9)
HEMOGLOBIN: 11.7 g/dL — AB (ref 13.0–17.1)
LYMPH#: 1.2 10*3/uL (ref 0.9–3.3)
LYMPH%: 48.5 % (ref 14.0–49.0)
MCH: 36.4 pg — AB (ref 27.2–33.4)
MCHC: 31.7 g/dL — AB (ref 32.0–36.0)
MCV: 114.8 fL — ABNORMAL HIGH (ref 79.3–98.0)
MONO#: 0.4 10*3/uL (ref 0.1–0.9)
MONO%: 16 % — ABNORMAL HIGH (ref 0.0–14.0)
NEUT#: 0.7 10*3/uL — ABNORMAL LOW (ref 1.5–6.5)
NEUT%: 27.6 % — AB (ref 39.0–75.0)
Platelets: 88 10*3/uL — ABNORMAL LOW (ref 140–400)
RBC: 3.21 10*6/uL — ABNORMAL LOW (ref 4.20–5.82)
RDW: 16.3 % — AB (ref 11.0–14.6)
WBC: 2.5 10*3/uL — ABNORMAL LOW (ref 4.0–10.3)

## 2014-08-18 LAB — HOLD TUBE, BLOOD BANK

## 2014-08-18 MED ORDER — TBO-FILGRASTIM 300 MCG/0.5ML ~~LOC~~ SOSY: 300.0000 ug | PREFILLED_SYRINGE | Freq: Once | SUBCUTANEOUS | Status: DC

## 2014-08-18 MED ORDER — TBO-FILGRASTIM 480 MCG/0.8ML ~~LOC~~ SOSY
480.0000 ug | PREFILLED_SYRINGE | Freq: Once | SUBCUTANEOUS | Status: AC
  Administered 2014-08-18: 480 ug via SUBCUTANEOUS
  Filled 2014-08-18: qty 0.8

## 2014-08-25 ENCOUNTER — Ambulatory Visit (HOSPITAL_BASED_OUTPATIENT_CLINIC_OR_DEPARTMENT_OTHER): Payer: Medicare Other

## 2014-08-25 ENCOUNTER — Other Ambulatory Visit (HOSPITAL_BASED_OUTPATIENT_CLINIC_OR_DEPARTMENT_OTHER): Payer: Medicare Other

## 2014-08-25 VITALS — BP 120/76 | HR 103 | Temp 97.4°F

## 2014-08-25 DIAGNOSIS — D72819 Decreased white blood cell count, unspecified: Secondary | ICD-10-CM

## 2014-08-25 DIAGNOSIS — D46C Myelodysplastic syndrome with isolated del(5q) chromosomal abnormality: Secondary | ICD-10-CM

## 2014-08-25 DIAGNOSIS — T451X5A Adverse effect of antineoplastic and immunosuppressive drugs, initial encounter: Principal | ICD-10-CM

## 2014-08-25 DIAGNOSIS — D649 Anemia, unspecified: Secondary | ICD-10-CM

## 2014-08-25 DIAGNOSIS — D701 Agranulocytosis secondary to cancer chemotherapy: Secondary | ICD-10-CM

## 2014-08-25 LAB — CBC WITH DIFFERENTIAL/PLATELET
BASO%: 1.2 % (ref 0.0–2.0)
BASOS ABS: 0 10*3/uL (ref 0.0–0.1)
EOS%: 7.5 % — AB (ref 0.0–7.0)
Eosinophils Absolute: 0.2 10*3/uL (ref 0.0–0.5)
HCT: 37.6 % — ABNORMAL LOW (ref 38.4–49.9)
HGB: 12 g/dL — ABNORMAL LOW (ref 13.0–17.1)
LYMPH%: 43.7 % (ref 14.0–49.0)
MCH: 36.6 pg — AB (ref 27.2–33.4)
MCHC: 32 g/dL (ref 32.0–36.0)
MCV: 114.5 fL — ABNORMAL HIGH (ref 79.3–98.0)
MONO#: 0.4 10*3/uL (ref 0.1–0.9)
MONO%: 14.2 % — ABNORMAL HIGH (ref 0.0–14.0)
NEUT#: 1 10*3/uL — ABNORMAL LOW (ref 1.5–6.5)
NEUT%: 33.4 % — ABNORMAL LOW (ref 39.0–75.0)
Platelets: 112 10*3/uL — ABNORMAL LOW (ref 140–400)
RBC: 3.28 10*6/uL — ABNORMAL LOW (ref 4.20–5.82)
RDW: 16.4 % — ABNORMAL HIGH (ref 11.0–14.6)
WBC: 3.1 10*3/uL — ABNORMAL LOW (ref 4.0–10.3)
lymph#: 1.3 10*3/uL (ref 0.9–3.3)

## 2014-08-25 MED ORDER — TBO-FILGRASTIM 480 MCG/0.8ML ~~LOC~~ SOSY
480.0000 ug | PREFILLED_SYRINGE | Freq: Once | SUBCUTANEOUS | Status: AC
  Administered 2014-08-25: 480 ug via SUBCUTANEOUS
  Filled 2014-08-25: qty 0.8

## 2014-08-25 NOTE — Patient Instructions (Signed)

## 2014-08-26 ENCOUNTER — Encounter: Payer: Self-pay | Admitting: Vascular Surgery

## 2014-08-27 ENCOUNTER — Encounter: Payer: Self-pay | Admitting: Vascular Surgery

## 2014-08-27 ENCOUNTER — Other Ambulatory Visit: Payer: Self-pay

## 2014-08-27 ENCOUNTER — Encounter (HOSPITAL_COMMUNITY): Payer: Self-pay | Admitting: *Deleted

## 2014-08-27 ENCOUNTER — Encounter (HOSPITAL_COMMUNITY): Payer: Medicare Other | Admitting: Anesthesiology

## 2014-08-27 ENCOUNTER — Ambulatory Visit (HOSPITAL_COMMUNITY): Payer: Medicare Other | Admitting: Anesthesiology

## 2014-08-27 ENCOUNTER — Ambulatory Visit (HOSPITAL_COMMUNITY)
Admission: AD | Admit: 2014-08-27 | Discharge: 2014-08-27 | Disposition: A | Discharge status: Home / Self Care | Payer: Medicare Other | Source: Ambulatory Visit | Attending: Vascular Surgery | Admitting: Vascular Surgery

## 2014-08-27 ENCOUNTER — Encounter (HOSPITAL_COMMUNITY)
Admission: AD | Disposition: A | Discharge status: Home / Self Care | Payer: Self-pay | Source: Ambulatory Visit | Attending: Vascular Surgery

## 2014-08-27 ENCOUNTER — Ambulatory Visit (INDEPENDENT_AMBULATORY_CARE_PROVIDER_SITE_OTHER): Payer: Medicare Other | Admitting: Vascular Surgery

## 2014-08-27 VITALS — BP 117/51 | HR 95 | Temp 97.8°F | Resp 18 | Ht 68.0 in | Wt 174.0 lb

## 2014-08-27 DIAGNOSIS — N186 End stage renal disease: Secondary | ICD-10-CM | POA: Diagnosis not present

## 2014-08-27 DIAGNOSIS — K219 Gastro-esophageal reflux disease without esophagitis: Secondary | ICD-10-CM | POA: Diagnosis not present

## 2014-08-27 DIAGNOSIS — Z9861 Coronary angioplasty status: Secondary | ICD-10-CM | POA: Diagnosis not present

## 2014-08-27 DIAGNOSIS — Y838 Other surgical procedures as the cause of abnormal reaction of the patient, or of later complication, without mention of misadventure at the time of the procedure: Secondary | ICD-10-CM | POA: Insufficient documentation

## 2014-08-27 DIAGNOSIS — I25810 Atherosclerosis of coronary artery bypass graft(s) without angina pectoris: Secondary | ICD-10-CM | POA: Insufficient documentation

## 2014-08-27 DIAGNOSIS — Y92239 Unspecified place in hospital as the place of occurrence of the external cause: Secondary | ICD-10-CM | POA: Insufficient documentation

## 2014-08-27 DIAGNOSIS — T82898A Other specified complication of vascular prosthetic devices, implants and grafts, initial encounter: Secondary | ICD-10-CM | POA: Diagnosis not present

## 2014-08-27 DIAGNOSIS — I509 Heart failure, unspecified: Secondary | ICD-10-CM | POA: Diagnosis not present

## 2014-08-27 DIAGNOSIS — T82511D Breakdown (mechanical) of surgically created arteriovenous shunt, subsequent encounter: Secondary | ICD-10-CM

## 2014-08-27 DIAGNOSIS — I120 Hypertensive chronic kidney disease with stage 5 chronic kidney disease or end stage renal disease: Secondary | ICD-10-CM | POA: Diagnosis not present

## 2014-08-27 DIAGNOSIS — T82868A Thrombosis of vascular prosthetic devices, implants and grafts, initial encounter: Secondary | ICD-10-CM

## 2014-08-27 DIAGNOSIS — Z992 Dependence on renal dialysis: Secondary | ICD-10-CM | POA: Insufficient documentation

## 2014-08-27 DIAGNOSIS — T82511A Breakdown (mechanical) of surgically created arteriovenous shunt, initial encounter: Secondary | ICD-10-CM | POA: Insufficient documentation

## 2014-08-27 HISTORY — PX: REVISION OF ARTERIOVENOUS GORETEX GRAFT: SHX6073

## 2014-08-27 LAB — POCT I-STAT 4, (NA,K, GLUC, HGB,HCT)
GLUCOSE: 94 mg/dL (ref 70–99)
HCT: 39 % (ref 39.0–52.0)
Hemoglobin: 13.3 g/dL (ref 13.0–17.0)
POTASSIUM: 5 meq/L (ref 3.7–5.3)
Sodium: 136 mEq/L — ABNORMAL LOW (ref 137–147)

## 2014-08-27 SURGERY — REVISION OF ARTERIOVENOUS GORETEX GRAFT
Anesthesia: General | Site: Thigh | Laterality: Left

## 2014-08-27 MED ORDER — 0.9 % SODIUM CHLORIDE (POUR BTL) OPTIME
TOPICAL | Status: DC | PRN
  Administered 2014-08-27: 1000 mL

## 2014-08-27 MED ORDER — OXYCODONE HCL 5 MG/5ML PO SOLN: 5.0000 mg | Freq: Once | ORAL | Status: AC | PRN

## 2014-08-27 MED ORDER — OXYCODONE HCL 5 MG PO TABS
5.0000 mg | ORAL_TABLET | Freq: Once | ORAL | Status: AC | PRN
  Administered 2014-08-27: 5 mg via ORAL

## 2014-08-27 MED ORDER — DEXTROSE 5 % IV SOLN
1.5000 g | INTRAVENOUS | Status: AC
  Administered 2014-08-27: 1.5 g via INTRAVENOUS

## 2014-08-27 MED ORDER — THROMBIN 20000 UNITS EX SOLR
CUTANEOUS | Status: AC
  Filled 2014-08-27: qty 20000

## 2014-08-27 MED ORDER — HEPARIN SODIUM (PORCINE) 1000 UNIT/ML IJ SOLN
INTRAMUSCULAR | Status: DC | PRN
  Administered 2014-08-27: 7000 [IU] via INTRAVENOUS

## 2014-08-27 MED ORDER — FENTANYL CITRATE 0.05 MG/ML IJ SOLN: 25.0000 ug | INTRAMUSCULAR | Status: DC | PRN

## 2014-08-27 MED ORDER — PROTAMINE SULFATE 10 MG/ML IV SOLN
INTRAVENOUS | Status: DC | PRN
  Administered 2014-08-27: 15 mg via INTRAVENOUS
  Administered 2014-08-27: 10 mg via INTRAVENOUS
  Administered 2014-08-27: 15 mg via INTRAVENOUS

## 2014-08-27 MED ORDER — FENTANYL CITRATE 0.05 MG/ML IJ SOLN
INTRAMUSCULAR | Status: DC | PRN
  Administered 2014-08-27 (×3): 25 ug via INTRAVENOUS

## 2014-08-27 MED ORDER — LIDOCAINE HCL (CARDIAC) 20 MG/ML IV SOLN
INTRAVENOUS | Status: DC | PRN
  Administered 2014-08-27: 80 mg via INTRAVENOUS

## 2014-08-27 MED ORDER — PROPOFOL 10 MG/ML IV BOLUS
INTRAVENOUS | Status: DC | PRN
  Administered 2014-08-27: 140 mg via INTRAVENOUS

## 2014-08-27 MED ORDER — SODIUM CHLORIDE 0.9 % IV SOLN
INTRAVENOUS | Status: DC
  Administered 2014-08-27: 12:00:00 via INTRAVENOUS

## 2014-08-27 MED ORDER — FENTANYL CITRATE 0.05 MG/ML IJ SOLN
INTRAMUSCULAR | Status: AC
  Filled 2014-08-27: qty 5

## 2014-08-27 MED ORDER — LIDOCAINE-EPINEPHRINE (PF) 1 %-1:200000 IJ SOLN
INTRAMUSCULAR | Status: AC
  Filled 2014-08-27: qty 10

## 2014-08-27 MED ORDER — SODIUM CHLORIDE 0.9 % IR SOLN
Status: DC | PRN
  Administered 2014-08-27: 15:00:00

## 2014-08-27 MED ORDER — DEXTROSE 5 % IV SOLN
INTRAVENOUS | Status: AC
  Filled 2014-08-27: qty 1.5

## 2014-08-27 MED ORDER — PHENYLEPHRINE HCL 10 MG/ML IJ SOLN
INTRAMUSCULAR | Status: DC | PRN
  Administered 2014-08-27: 80 ug via INTRAVENOUS
  Administered 2014-08-27: 120 ug via INTRAVENOUS
  Administered 2014-08-27: 80 ug via INTRAVENOUS

## 2014-08-27 MED ORDER — PHENYLEPHRINE HCL 10 MG/ML IJ SOLN
10.0000 mg | INTRAVENOUS | Status: DC | PRN
  Administered 2014-08-27: 15 ug/min via INTRAVENOUS

## 2014-08-27 MED ORDER — OXYCODONE HCL 5 MG PO TABS
ORAL_TABLET | ORAL | Status: AC
  Filled 2014-08-27: qty 1

## 2014-08-27 MED ORDER — OXYCODONE-ACETAMINOPHEN 5-325 MG PO TABS: 1.0000 | ORAL_TABLET | Freq: Four times a day (QID) | ORAL | Status: DC | PRN

## 2014-08-27 MED ORDER — CHLORHEXIDINE GLUCONATE CLOTH 2 % EX PADS: 6.0000 | MEDICATED_PAD | Freq: Once | CUTANEOUS | Status: DC

## 2014-08-27 SURGICAL SUPPLY — 45 items
ADH SKN CLS APL DERMABOND .7 (GAUZE/BANDAGES/DRESSINGS) ×2
CANISTER SUCTION 2500CC (MISCELLANEOUS) ×3 IMPLANT
CATH EMB 4FR 80CM (CATHETERS) ×2 IMPLANT
CLIP TI MEDIUM 6 (CLIP) ×3 IMPLANT
CLIP TI WIDE RED SMALL 6 (CLIP) ×3 IMPLANT
COVER SURGICAL LIGHT HANDLE (MISCELLANEOUS) ×3 IMPLANT
DECANTER SPIKE VIAL GLASS SM (MISCELLANEOUS) ×3 IMPLANT
DERMABOND ADVANCED (GAUZE/BANDAGES/DRESSINGS) ×4
DERMABOND ADVANCED .7 DNX12 (GAUZE/BANDAGES/DRESSINGS) ×1 IMPLANT
ELECT REM PT RETURN 9FT ADLT (ELECTROSURGICAL) ×3
ELECTRODE REM PT RTRN 9FT ADLT (ELECTROSURGICAL) ×1 IMPLANT
GLOVE BIO SURGEON STRL SZ7.5 (GLOVE) ×3 IMPLANT
GLOVE BIOGEL PI IND STRL 6.5 (GLOVE) IMPLANT
GLOVE BIOGEL PI IND STRL 7.5 (GLOVE) IMPLANT
GLOVE BIOGEL PI IND STRL 8 (GLOVE) ×1 IMPLANT
GLOVE BIOGEL PI INDICATOR 6.5 (GLOVE) ×2
GLOVE BIOGEL PI INDICATOR 7.5 (GLOVE) ×4
GLOVE BIOGEL PI INDICATOR 8 (GLOVE) ×4
GLOVE SS BIOGEL STRL SZ 7 (GLOVE) IMPLANT
GLOVE SUPERSENSE BIOGEL SZ 7 (GLOVE) ×2
GLOVE SURG SS PI 7.0 STRL IVOR (GLOVE) ×2 IMPLANT
GOWN STRL REUS W/ TWL LRG LVL3 (GOWN DISPOSABLE) ×3 IMPLANT
GOWN STRL REUS W/ TWL XL LVL3 (GOWN DISPOSABLE) IMPLANT
GOWN STRL REUS W/TWL LRG LVL3 (GOWN DISPOSABLE) ×9
GOWN STRL REUS W/TWL XL LVL3 (GOWN DISPOSABLE) ×3
GRAFT GORETEX STND 4X7 (Vascular Products) ×3 IMPLANT
GRAFT GORETEXSTD 4X7 (Vascular Products) IMPLANT
KIT BASIN OR (CUSTOM PROCEDURE TRAY) ×3 IMPLANT
KIT ROOM TURNOVER OR (KITS) ×3 IMPLANT
NDL HYPO 25GX1X1/2 BEV (NEEDLE) ×1 IMPLANT
NEEDLE HYPO 25GX1X1/2 BEV (NEEDLE) ×3 IMPLANT
NS IRRIG 1000ML POUR BTL (IV SOLUTION) ×3 IMPLANT
PACK CV ACCESS (CUSTOM PROCEDURE TRAY) ×3 IMPLANT
PAD ARMBOARD 7.5X6 YLW CONV (MISCELLANEOUS) ×6 IMPLANT
SPONGE GAUZE 4X4 12PLY STER LF (GAUZE/BANDAGES/DRESSINGS) ×2 IMPLANT
SPONGE SURGIFOAM ABS GEL 100 (HEMOSTASIS) IMPLANT
SUT PROLENE 6 0 BV (SUTURE) ×6 IMPLANT
SUT VIC AB 3-0 SH 27 (SUTURE) ×6
SUT VIC AB 3-0 SH 27X BRD (SUTURE) ×2 IMPLANT
SUT VICRYL 4-0 PS2 18IN ABS (SUTURE) ×6 IMPLANT
TAPE CLOTH SURG 4X10 WHT LF (GAUZE/BANDAGES/DRESSINGS) ×2 IMPLANT
TOWEL OR 17X24 6PK STRL BLUE (TOWEL DISPOSABLE) ×3 IMPLANT
TOWEL OR 17X26 10 PK STRL BLUE (TOWEL DISPOSABLE) ×3 IMPLANT
UNDERPAD 30X30 INCONTINENT (UNDERPADS AND DIAPERS) ×3 IMPLANT
WATER STERILE IRR 1000ML POUR (IV SOLUTION) ×3 IMPLANT

## 2014-08-27 NOTE — Progress Notes (Signed)
[facility] HISTORY AND PHYSICAL   History of Present Illness:  Patient is a 80 y.o. year old male who presents for evaluation of ulceration of his left thigh graft. He dialyzes on Monday Wednesday and Friday and rocking him. His primary nephrologist is Dr. Florene Glen. He has had 3 small bleeding episodes from the ulceration recently. He has been keeping a Band-Aid over this.  Other medical problems include impaired hearing and aortic stenosis. He also has a history of pulmonary hypertension. All of these are currently stable. He also has had a history of pancytopenia. I reviewed his CBC from September 29. This showed slight decrease of his white blood cells; however his hemoglobin was reasonable. His platelet count was 112.  Past Medical History  Diagnosis Date  . ESRD on hemodialysis     Hemodialysis since 2003; Occluded access in both upper extremities and the right lower extremity, current access as of Aug 2014 is L thigh AVG. Gets HD MWF at [facility].     . Duodenal ulcer     remote  . Chronic combined systolic and diastolic CHF (congestive heart failure)     a. 02/2012 Echo: EF 65%, basal infolat AK, mild conc LVH;  b. 06/2013 Echo: EF 40-45%, mild LVH, Gr2 DD, basal inf HK->AK, Mod AS, Mild MR, PASP 73  . Arteriosclerotic cardiovascular disease (ASCVD) 1998    a. [id number]; b. 06/2003 Cath: LM 70d, LAD 70ost, 80p, LCX 70p/159m, RCA 80p/154m, LIMA->LAD patent, VG->OM2->OM3 patent, VG->dRCA patent, EF 55%;  c. 02/2012 Lexi MV: inf/inflat scarring w/ ? minimal peri-infarct ischemia.  . Cerebrovascular disease   . Neuropathy of foot   . Degenerative joint disease   . Gastroesophageal reflux disease   . Impaired hearing     Bilateral; hearing aids relatively ineffective  . Hypertension   . Cholelithiasis 03/2011    Diagnosed incidentally on abdominal ultrasound in 03/2011  . Hepatic steatosis   . Macrocytosis 10/20/2012  . Thrombocytopenia 10/20/2012  .  Gallstones   . Hyperlipidemia   . Secondary hyperparathyroidism   . Peripheral vascular disease   . Gout   . Moderate aortic stenosis     a. 06/2013 Echo: Mod AS, mean grad: 25, peak grad: 57.  . Pulmonary hypertension     a. 06/2013 Echo: PASP 45mmHg.  Marland Kitchen Anemia     Prior blood transfusion  . History of blood transfusion     last infusion 07/31/13  . Complication of anesthesia     daughter reports long episodes confusion after amnesia meds  . History of bladder cancer   . Renal cell carcinoma 2001    s/p right nephrectomy-2001; subsequent ESRD  . 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 Hypotension   . Pancytopenia     Past Surgical History  Procedure Laterality Date  . Right nephrectomy  2001    Renal cell carcinoma  . Colonoscopy  01/24/2011    prominent vascular pattern, suboptimal prep but doable  . Esophagogastroduodenoscopy  01/24/2011    mild erosive reflux esophagitis, bulbar/antral erosions, bx from antrum benign  . Av fistula repair  2008    revision of anastomosis of Right AVF  . Arteriovenous graft placement  2006    Thrombectomy and interposition jump graft revision to higher axillary vein of LUA AVG  . Coronary artery bypass graft  1998    X4 VESSELS  . Multiple cysto/ resection tumor's with bx's  LAST ONE 05-09-2011  . Multiple surg's /  interventions for avgg/ fistula right upper arm  LAST REVISION 06-29-2011    (JUNE [id number] VEIN/ [id number] DILATATION ANGIOPLASTY)  . Left ptc left renal artery    . Cardiac catheterization  2004  . Cataract extraction w/ intraocular lens  implant, bilateral    . Cystoscopy w/ retrogrades  12/28/2011    Procedure: CYSTOSCOPY WITH RETROGRADE PYELOGRAM;  Surgeon: Malka So, MD;  Location: [facility];  Service: Urology;  Laterality: Left;  C-ARM   . Transurethral resection of bladder tumor  12/28/2011    Procedure: TRANSURETHRAL RESECTION OF BLADDER TUMOR (TURBT);  Surgeon: Malka So, MD;  [facility];  Service: Urology;  Laterality: N/A;  . Av fistula placement  02/29/2012    Procedure: INSERTION OF ARTERIOVENOUS (AV) GORE-TEX GRAFT THIGH;  Surgeon: Rosetta Posner, MD;  Location: [facility];  Service: Vascular;  Laterality: Right;  Insertion right femoral arteriovenous gortex graft  . Ligation of right braciocephalic av fistula  [id number]  . Insertion of dialysis catheter  06/05/2012    Procedure: INSERTION OF DIALYSIS CATHETER;  Surgeon: Mal Misty, MD;  Location: [place];  Service: Vascular;  Laterality: Left;  Insertion diatek catheter left IJ  . Insertion of dialysis catheter  08/22/2012    Procedure: INSERTION OF DIALYSIS CATHETER;  Surgeon: Serafina [facility], MD;  Location: [facility];  Service: Vascular;  Laterality: Left;  insertion of dialysis catheter left  internal jugular 27cm  . Av fistula placement  10/08/2012    Procedure: INSERTION OF ARTERIOVENOUS (AV) GORE-TEX GRAFT THIGH;  Surgeon: Angelia Mould, MD;  Location: [facility];  Service: Vascular;  Laterality: Left;  . Cystoscopy with biopsy N/A 08/07/2013    Procedure: CYSTOSCOPY WITH BLADDER BIOPSY;  Surgeon: Malka So, MD;  Location: [facility];  Service: Urology;  Laterality: N/A;  . Fulguration of bladder tumor N/A 08/07/2013    Procedure: FULGURATION OF BLADDER TUMOR;  Surgeon: Malka So, MD;  Location: [facility];  Service: Urology;  Laterality: N/A;    Social History History  Substance Use Topics  . Smoking status: Former Smoker -- 1.00 packs/day for 25 years    Types: Cigarettes    Quit date: 02/26/1984  . Smokeless tobacco: Former Systems developer     Comment: quit 1985  . Alcohol Use: No    Family History Family History  Problem Relation Age of Onset  . Colon cancer Neg Hx   . Liver disease Neg Hx   . Anesthesia problems Neg Hx   . Heart disease Father   . Other Mother     died @ 49 with respiratory issues following hip fx  . Diabetes Mother     Allergies  No Known  Allergies   Current Outpatient Prescriptions  Medication Sig Dispense Refill  . acetaminophen (TYLENOL) 500 MG tablet Take 500 mg by mouth every 6 (six) hours as needed. For pain/headache      . albuterol (PROVENTIL HFA;VENTOLIN HFA) 108 (90 BASE) MCG/ACT inhaler Inhale 2 puffs into the lungs every 6 (six) hours as needed for wheezing or shortness of breath.  1 Inhaler  0  . allopurinol (ZYLOPRIM) 100 MG tablet Take 200 mg by mouth at bedtime.       Marland Kitchen aspirin EC 81 MG tablet Take 81 mg by mouth at bedtime.       Marland Kitchen atorvastatin (LIPITOR) 10 MG tablet Take 10 mg by mouth every morning.       Marland Kitchen  B Complex-C-Folic Acid (DIALYVITE TABLET) TABS Take 1 tablet by mouth at bedtime.       . calcium carbonate (TUMS - DOSED IN MG ELEMENTAL CALCIUM) 500 MG chewable tablet Chew 1 tablet by mouth daily as needed for heartburn. For heartburn      . cinacalcet (SENSIPAR) 30 MG tablet Take 30 mg by mouth daily with breakfast.       . colchicine (COLCRYS) 0.6 MG tablet Take 0.6 mg by mouth 2 (two) times daily as needed. for gout      . docusate sodium (COLACE) 100 MG capsule Take 100 mg by mouth daily.      Marland Kitchen gabapentin (NEURONTIN) 100 MG capsule Take 100 mg by mouth 2 (two) times daily. Takes 1 in the morning and 2 at night      . midodrine (PROAMATINE) 10 MG tablet Take 20 mg by mouth 3 (three) times daily.       Marland Kitchen omeprazole (PRILOSEC) 40 MG capsule Take 40 mg by mouth 2 (two) times daily. For reflux      . polyethylene glycol powder (MIRALAX) powder Take 17 g by mouth at bedtime.       . sevelamer (RENVELA) 800 MG tablet Take 2,400 mg by mouth 3 (three) times daily with meals. 4 tabs 3 x daily and 2 with snack       No current facility-administered medications for this visit.   Facility-Administered Medications Ordered in Other Visits  Medication Dose Route Frequency Provider Last Rate Last Dose  . methylPREDNISolone sodium succinate (SOLU-MEDROL) 125 mg/2 mL injection 60 mg  60 mg Intravenous Once Maree Krabbe, MD        ROS:   General:  No weight loss, Fever, chills  HEENT: No recent headaches, no nasal bleeding, no visual changes, no sore throat  Neurologic: No dizziness, blackouts, seizures. No recent symptoms of stroke or mini- stroke. No recent episodes of slurred speech, or temporary blindness.  Cardiac: No recent episodes of chest pain/pressure, no shortness of breath at rest.  No shortness of breath with exertion.  Denies history of atrial fibrillation or irregular heartbeat  Vascular: No history of rest pain in feet.  No history of claudication.  No history of non-healing ulcer, No history of DVT   Pulmonary: No home oxygen, no productive cough, no hemoptysis,  No asthma or wheezing  Musculoskeletal:  [ ]  Arthritis, [ ]  Low back pain,  [ ]  Joint pain  Hematologic:No history of hypercoagulable state.  No history of easy bleeding.  No history of anemia  Gastrointestinal: No hematochezia or melena,  No gastroesophageal reflux, no trouble swallowing  Urinary: [ ]  chronic Kidney disease, [ x] on HD - [x ] MWF or [ ]  TTHS, [ ]  Burning with urination, [ ]  Frequent urination, [ ]  Difficulty urinating;   Skin: No rashes  Psychological: No history of anxiety,  No history of depression   Physical Examination  Filed Vitals:   08/27/14 0955  BP: 117/51  Pulse: 95  Temp: 97.8 F (36.6 C)  TempSrc: Oral  Resp: 18  Height: 5\' 8"  (1.727 m)  Weight: 174 lb (78.926 kg)  SpO2: 99%    Body mass index is 26.46 kg/(m^2).  General:  Alert and oriented, no acute distress HEENT: Normal Neck: No bruit or JVD Pulmonary: Clear to auscultation bilaterally Cardiac: Regular Rate and Rhythm Abdomen: Soft, non-tender, non-distended Skin: No rash Extremity:  Left thigh graft with audible bruit 1.5 cm ulceration with protruding eschar  5 oclock position Musculoskeletal: No deformity or edema  Neurologic: Upper and lower extremity motor 5/5 and symmetric  ASSESSMENT:  Ulceration left  thigh graft at high risk for bleeding  PLAN:  Plan for revision left thigh graft by Dr. Scot Dock later today. The patient had a glass of milk at [place], MD [facility] Office: [phone] Pager: [phone]

## 2014-08-27 NOTE — Transfer of Care (Signed)
Immediate Anesthesia Transfer of Care Note  Patient: Andrew [name]  Procedure(s) Performed: Procedure(s): THROMBECTOMY & REVISION OF LEFT ARM  ARTERIOVENOUS GORETEX GRAFT (Left)  Patient Location: PACU  Anesthesia Type:General  Level of Consciousness: sedated and responds to stimulation  Airway & Oxygen Therapy: Patient Spontanous Breathing and Patient connected to nasal cannula oxygen  Post-op Assessment: Report given to PACU RN, Post -op Vital signs reviewed and stable and Patient moving all extremities  Post vital signs: Reviewed and stable  Complications: No apparent anesthesia complications

## 2014-08-27 NOTE — Anesthesia Procedure Notes (Signed)
Procedure Name: LMA Insertion Date/Time: 08/27/2014 2:00 PM Performed by: Julian Reil Pre-anesthesia Checklist: Patient identified, Emergency Drugs available, Suction available and Patient being monitored Patient Re-evaluated:Patient Re-evaluated prior to inductionOxygen Delivery Method: Circle system utilized Preoxygenation: Pre-oxygenation with 100% oxygen Intubation Type: IV induction LMA: LMA inserted LMA Size: 4.0 Tube type: Oral Number of attempts: 1 Placement Confirmation: positive ETCO2 and breath sounds checked- equal and bilateral Tube secured with: Tape Dental Injury: Teeth and Oropharynx as per pre-operative assessment

## 2014-08-27 NOTE — Progress Notes (Signed)
Bilat hearing aids and glasses ret to pt by D. Walker RN 

## 2014-08-27 NOTE — Progress Notes (Signed)
HD Access Record faxed to [facility]. Pt sched for HD tomorrow at noon.

## 2014-08-27 NOTE — Anesthesia Preprocedure Evaluation (Signed)
Anesthesia Evaluation  Patient identified by MRN, date of birth, ID band Patient awake    Reviewed: Allergy & Precautions, H&P , NPO status , Patient's Chart, lab work & pertinent test results, reviewed documented beta blocker date and time   History of Anesthesia Complications Negative for: history of anesthetic complications  Airway Mallampati: II TM Distance: >3 FB     Dental  (+) Teeth Intact, Dental Advisory Given, Caps   Pulmonary shortness of breath and with exertion, former smoker,  breath sounds clear to auscultation        Cardiovascular hypertension, Pt. on medications + CAD, + CABG (1998), + Peripheral Vascular Disease and +CHF Rhythm:Regular Rate:Normal  02/2012 Echo: EF 65%, basal infolat AK, mild conc LVH;  b. 06/2013 Echo: EF 40-45%, mild LVH, Gr2 DD, basal inf HK->AK, Mod AS, Mild MR, PASP 73   Neuro/Psych  Neuromuscular disease    GI/Hepatic Neg liver ROS, PUD, GERD-  Medicated and Controlled,  Endo/Other  negative endocrine ROS  Renal/GU ESRF and DialysisRenal disease     Musculoskeletal   Abdominal   Peds  Hematology negative hematology ROS (+) Blood dyscrasia, anemia , PLT 57k   Anesthesia Other Findings Caps and bridges  Reproductive/Obstetrics                           Anesthesia Physical Anesthesia Plan  ASA: III  Anesthesia Plan: General   Post-op Pain Management:    Induction: Intravenous  Airway Management Planned: LMA and Oral ETT  Additional Equipment: None  Intra-op Plan:   Post-operative Plan: Extubation in OR  Informed Consent: I have reviewed the patients History and Physical, chart, labs and discussed the procedure including the risks, benefits and alternatives for the proposed anesthesia with the patient or authorized representative who has indicated his/her understanding and acceptance.   Dental advisory given  Plan Discussed with: CRNA and  Surgeon  Anesthesia Plan Comments:         Anesthesia Quick Evaluation

## 2014-08-27 NOTE — Interval H&P Note (Signed)
History and Physical Interval Note:  08/27/2014 11:42 AM  Andrew [name]  has presented today for surgery, with the diagnosis of Pseudoaneurysm of arteriovenous graft, initial encounter  The various methods of treatment have been discussed with the patient and family. After consideration of risks, benefits and other options for treatment, the patient has consented to  Procedure(s): REVISION OF ARTERIOVENOUS GORETEX GRAFT (Left) as a surgical intervention .  The patient's history has been reviewed, patient examined, no change in status, stable for surgery.  I have reviewed the patient's chart and labs.  Questions were answered to the patient's satisfaction.     DICKSON,CHRISTOPHER S

## 2014-08-27 NOTE — Op Note (Signed)
    NAME: [name]   MRN: [id number] DOB: [birth year]    DATE OF OPERATION: 08/27/2014  PREOP DIAGNOSIS: eschar overlying left thigh AV graft  POSTOP DIAGNOSIS: same  PROCEDURE:  1. Thrombectomy of left thigh AV graft. 2. Revision of left thigh AV graft with interposition 7 mm PTFE graft replacing arterial half of graft  SURGEON: Judeth Cornfield. Scot Dock, MD, FACS  ASSIST: Gerri Lins PA  ANESTHESIA: Gen.   EBL: minimal  INDICATIONS: Andrew [name] is a 80 y.o. male who was seen in the office today with an eschar overlying the arterial half of the graft. He was sent to the operating room for revision of the graft given the risk of bleeding.  FINDINGS: there was no signs of infection. I replaced the venous half of the graft.  TECHNIQUE: The patient was taken to the urine and received a general anesthetic. The left thigh was prepped and draped in the usual sterile fashion. I elected to replace the venous half of the graft. At the distal loop an oblique incision was made in the graft in this area was dissected free. Along the lateral aspect of the thigh closer to the groin an oblique incision was made over the graft where the graft was dissected free. A tunnel was created between the 2 incisions lateral to the old graft. A 7 mm graft was tunneled between the 2 incisions and the patient was heparinized. The graft and clamped at both directions and the graft divided at both ends. I passed a #4 Fogarty catheter through the arterial limb of the graft and no clot was retrieved. There was some evidence of diffuse intimal hyperplasia within the graft. I then passed the catheter through the venous half of the graft again there was some diffuse intimal hyperplasia. A small amount of clot was retrieved. The graft was flushed with heparinized saline clamp. At the arterial end of the graft, the new segment of graft was sewn end-to-end to the old graft using continuous 6-0 Prolene suture. The graft and  pulled length for anastomosis at the distal loop. The new segment of graft the appropriate length and sewn end-to-end to the old graft using continuous 6-0 Prolene suture. At completion was a good thrill in the graft. Hemostasis was obtained in the wound and the heparin was partially reversed with protamine. The wounds closed the deep layer of 3-0 Vicryl the skin closed with 4-0 Vicryl. Dermabond was applied. The area where the small eschar was was removed and the hole was only approximately 3 mm. There was no signs of infection. There was no purulence or erythema. I simply close this with a 3-0 nylon suture. Sterile dressing was applied. The patient tolerated the procedure well transferred to the recovery room in stable condition. All needle and sponge counts were correct.  Deitra Mayo, MD, FACS [facility]  DATE OF DICTATION:   08/27/2014

## 2014-08-27 NOTE — H&P (View-Only) (Signed)
[facility] [place] HISTORY AND PHYSICAL   History of Present Illness:  Patient is a 80 y.o. year old male who presents for evaluation of ulceration of his left thigh graft. He dialyzes on Monday Wednesday and Friday and rocking him. His primary nephrologist is Dr. Florene Glen. He has had 3 small bleeding episodes from the ulceration recently. He has been keeping a Band-Aid over this.  Other medical problems include impaired hearing and aortic stenosis. He also has a history of pulmonary hypertension. All of these are currently stable. He also has had a history of pancytopenia. I reviewed his CBC from September 29. This showed slight decrease of his white blood cells; however his hemoglobin was reasonable. His platelet count was 112.  Past Medical History  Diagnosis Date  . ESRD on hemodialysis     Hemodialysis since 2003; Occluded access in both upper extremities and the right lower extremity, current access as of Aug 2014 is L thigh AVG. Gets HD MWF at [facility].     . Duodenal ulcer     remote  . Chronic combined systolic and diastolic CHF (congestive heart failure)     a. 02/2012 Echo: EF 65%, basal infolat AK, mild conc LVH;  b. 06/2013 Echo: EF 40-45%, mild LVH, Gr2 DD, basal inf HK->AK, Mod AS, Mild MR, PASP 73  . Arteriosclerotic cardiovascular disease (ASCVD) 1998    a. [id number]; b. 06/2003 Cath: LM 70d, LAD 70ost, 80p, LCX 70p/13m, RCA 80p/12m, LIMA->LAD patent, VG->OM2->OM3 patent, VG->dRCA patent, EF 55%;  c. 02/2012 Lexi MV: inf/inflat scarring w/ ? minimal peri-infarct ischemia.  . Cerebrovascular disease   . Neuropathy of foot   . Degenerative joint disease   . Gastroesophageal reflux disease   . Impaired hearing     Bilateral; hearing aids relatively ineffective  . Hypertension   . Cholelithiasis 03/2011    Diagnosed incidentally on abdominal ultrasound in 03/2011  . Hepatic steatosis   . Macrocytosis 10/20/2012  . Thrombocytopenia 10/20/2012  .  Gallstones   . Hyperlipidemia   . Secondary hyperparathyroidism   . Peripheral vascular disease   . Gout   . Moderate aortic stenosis     a. 06/2013 Echo: Mod AS, mean grad: 25, peak grad: 57.  . Pulmonary hypertension     a. 06/2013 Echo: PASP 78mmHg.  Marland Kitchen Anemia     Prior blood transfusion  . History of blood transfusion     last infusion 07/31/13  . Complication of anesthesia     daughter reports long episodes confusion after amnesia meds  . History of bladder cancer   . Renal cell carcinoma 2001    s/p right nephrectomy-2001; subsequent ESRD  . 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 Hypotension   . Pancytopenia     Past Surgical History  Procedure Laterality Date  . Right nephrectomy  2001    Renal cell carcinoma  . Colonoscopy  01/24/2011    prominent vascular pattern, suboptimal prep but doable  . Esophagogastroduodenoscopy  01/24/2011    mild erosive reflux esophagitis, bulbar/antral erosions, bx from antrum benign  . Av fistula repair  2008    revision of anastomosis of Right AVF  . Arteriovenous graft placement  2006    Thrombectomy and interposition jump graft revision to higher axillary vein of LUA AVG  . Coronary artery bypass graft  1998    X4 VESSELS  . Multiple cysto/ resection tumor's with bx's  LAST ONE 05-09-2011  . Multiple surg's /  interventions for avgg/ fistula right upper arm  LAST REVISION 06-29-2011    (JUNE [address] [place] VEIN/ [id number] DILATATION ANGIOPLASTY)  . Left ptc left renal artery    . Cardiac catheterization  2004  . Cataract extraction w/ intraocular lens  implant, bilateral    . Cystoscopy w/ retrogrades  12/28/2011    Procedure: CYSTOSCOPY WITH RETROGRADE PYELOGRAM;  Surgeon: Malka So, MD;  Location: [facility];  Service: Urology;  Laterality: Left;  C-ARM   . Transurethral resection of bladder tumor  12/28/2011    Procedure: TRANSURETHRAL RESECTION OF BLADDER TUMOR (TURBT);  Surgeon: Malka So, MD;  Location: [facility];  Service: Urology;  Laterality: N/A;  . Av fistula placement  02/29/2012    Procedure: INSERTION OF ARTERIOVENOUS (AV) GORE-TEX GRAFT THIGH;  Surgeon: Rosetta Posner, MD;  Location: [place];  Service: Vascular;  Laterality: Right;  Insertion right femoral arteriovenous gortex graft  . Ligation of right braciocephalic av fistula  [id number]  . Insertion of dialysis catheter  06/05/2012    Procedure: INSERTION OF DIALYSIS CATHETER;  Surgeon: Mal Misty, MD;  Location: [place];  Service: Vascular;  Laterality: Left;  Insertion diatek catheter left IJ  . Insertion of dialysis catheter  08/22/2012    Procedure: INSERTION OF DIALYSIS CATHETER;  Surgeon: Serafina Mitchell, MD;  Location: [facility];  Service: Vascular;  Laterality: Left;  insertion of dialysis catheter left  internal jugular 27cm  . Av fistula placement  10/08/2012    Procedure: INSERTION OF ARTERIOVENOUS (AV) GORE-TEX GRAFT THIGH;  Surgeon: Angelia Mould, MD;  Location: [place];  Service: Vascular;  Laterality: Left;  . Cystoscopy with biopsy N/A 08/07/2013    Procedure: CYSTOSCOPY WITH BLADDER BIOPSY;  Surgeon: Malka So, MD;  Location: [facility];  Service: Urology;  Laterality: N/A;  . Fulguration of bladder tumor N/A 08/07/2013    Procedure: FULGURATION OF BLADDER TUMOR;  Surgeon: Malka So, MD;  Location: [facility];  Service: Urology;  Laterality: N/A;    Social History History  Substance Use Topics  . Smoking status: Former Smoker -- 1.00 packs/day for 25 years    Types: Cigarettes    Quit date: 02/26/1984  . Smokeless tobacco: Former Systems developer     Comment: quit 1985  . Alcohol Use: No    Family History Family History  Problem Relation Age of Onset  . Colon cancer Neg Hx   . Liver disease Neg Hx   . Anesthesia problems Neg Hx   . Heart disease Father   . Other Mother     died @ 6 with respiratory issues following hip fx  . Diabetes Mother     Allergies  No Known  Allergies   Current Outpatient Prescriptions  Medication Sig Dispense Refill  . acetaminophen (TYLENOL) 500 MG tablet Take 500 mg by mouth every 6 (six) hours as needed. For pain/headache      . albuterol (PROVENTIL HFA;VENTOLIN HFA) 108 (90 BASE) MCG/ACT inhaler Inhale 2 puffs into the lungs every 6 (six) hours as needed for wheezing or shortness of breath.  1 Inhaler  0  . allopurinol (ZYLOPRIM) 100 MG tablet Take 200 mg by mouth at bedtime.       Marland Kitchen aspirin EC 81 MG tablet Take 81 mg by mouth at bedtime.       Marland Kitchen atorvastatin (LIPITOR) 10 MG tablet Take 10 mg by mouth every morning.       Marland Kitchen  B Complex-C-Folic Acid (DIALYVITE TABLET) TABS Take 1 tablet by mouth at bedtime.       . calcium carbonate (TUMS - DOSED IN MG ELEMENTAL CALCIUM) 500 MG chewable tablet Chew 1 tablet by mouth daily as needed for heartburn. For heartburn      . cinacalcet (SENSIPAR) 30 MG tablet Take 30 mg by mouth daily with breakfast.       . colchicine (COLCRYS) 0.6 MG tablet Take 0.6 mg by mouth 2 (two) times daily as needed. for gout      . docusate sodium (COLACE) 100 MG capsule Take 100 mg by mouth daily.      Marland Kitchen gabapentin (NEURONTIN) 100 MG capsule Take 100 mg by mouth 2 (two) times daily. Takes 1 in the morning and 2 at night      . midodrine (PROAMATINE) 10 MG tablet Take 20 mg by mouth 3 (three) times daily.       Marland Kitchen omeprazole (PRILOSEC) 40 MG capsule Take 40 mg by mouth 2 (two) times daily. For reflux      . polyethylene glycol powder (MIRALAX) powder Take 17 g by mouth at bedtime.       . sevelamer (RENVELA) 800 MG tablet Take 2,400 mg by mouth 3 (three) times daily with meals. 4 tabs 3 x daily and 2 with snack       No current facility-administered medications for this visit.   Facility-Administered Medications Ordered in Other Visits  Medication Dose Route Frequency Provider Last Rate Last Dose  . methylPREDNISolone sodium succinate (SOLU-MEDROL) 125 mg/2 mL injection 60 mg  60 mg Intravenous Once Sol Blazing, MD        ROS:   General:  No weight loss, Fever, chills  HEENT: No recent headaches, no nasal bleeding, no visual changes, no sore throat  Neurologic: No dizziness, blackouts, seizures. No recent symptoms of stroke or mini- stroke. No recent episodes of slurred speech, or temporary blindness.  Cardiac: No recent episodes of chest pain/pressure, no shortness of breath at rest.  No shortness of breath with exertion.  Denies history of atrial fibrillation or irregular heartbeat  Vascular: No history of rest pain in feet.  No history of claudication.  No history of non-healing ulcer, No history of DVT   Pulmonary: No home oxygen, no productive cough, no hemoptysis,  No asthma or wheezing  Musculoskeletal:  [ ]  Arthritis, [ ]  Low back pain,  [ ]  Joint pain  Hematologic:No history of hypercoagulable state.  No history of easy bleeding.  No history of anemia  Gastrointestinal: No hematochezia or melena,  No gastroesophageal reflux, no trouble swallowing  Urinary: [ ]  chronic Kidney disease, [ x] on HD - [x ] MWF or [ ]  TTHS, [ ]  Burning with urination, [ ]  Frequent urination, [ ]  Difficulty urinating;   Skin: No rashes  Psychological: No history of anxiety,  No history of depression   Physical Examination  Filed Vitals:   08/27/14 0955  BP: 117/51  Pulse: 95  Temp: 97.8 F (36.6 C)  TempSrc: Oral  Resp: 18  Height: 5\' 8"  (1.727 m)  Weight: 174 lb (78.926 kg)  SpO2: 99%    Body mass index is 26.46 kg/(m^2).  General:  Alert and oriented, no acute distress HEENT: Normal Neck: No bruit or JVD Pulmonary: Clear to auscultation bilaterally Cardiac: Regular Rate and Rhythm Abdomen: Soft, non-tender, non-distended Skin: No rash Extremity:  Left thigh graft with audible bruit 1.5 cm ulceration with protruding eschar  5 oclock position Musculoskeletal: No deformity or edema  Neurologic: Upper and lower extremity motor 5/5 and symmetric  ASSESSMENT:  Ulceration left  thigh graft at high risk for bleeding  PLAN:  Plan for revision left thigh graft by Dr. Scot Dock later today. The patient had a glass of milk at [place], MD [facility] [place] Office: [phone] Pager: [phone]

## 2014-08-27 NOTE — Discharge Instructions (Signed)

## 2014-08-28 NOTE — Anesthesia Postprocedure Evaluation (Signed)
  Anesthesia Post-op Note  Patient: Andrew [name]  Procedure(s) Performed: Procedure(s): THROMBECTOMY & REVISION OF LEFT ARM  ARTERIOVENOUS GORETEX GRAFT (Left)  Patient Location: PACU  Anesthesia Type:General  Level of Consciousness: awake and alert   Airway and Oxygen Therapy: Patient Spontanous Breathing  Post-op Pain: mild  Post-op Assessment: Post-op Vital signs reviewed, Patient's Cardiovascular Status Stable, Respiratory Function Stable and Patent Airway  Post-op Vital Signs: Reviewed and stable  Last Vitals:  Filed Vitals:   08/27/14 1630  BP:   Pulse:   Temp: 36.1 C  Resp:     Complications: No apparent anesthesia complications

## 2014-08-31 ENCOUNTER — Encounter (HOSPITAL_COMMUNITY): Payer: Self-pay | Admitting: Vascular Surgery

## 2014-09-01 ENCOUNTER — Other Ambulatory Visit (HOSPITAL_BASED_OUTPATIENT_CLINIC_OR_DEPARTMENT_OTHER): Payer: Medicare Other

## 2014-09-01 ENCOUNTER — Ambulatory Visit (HOSPITAL_BASED_OUTPATIENT_CLINIC_OR_DEPARTMENT_OTHER): Payer: Medicare Other

## 2014-09-01 VITALS — BP 120/53 | HR 84 | Temp 97.9°F

## 2014-09-01 DIAGNOSIS — D46C Myelodysplastic syndrome with isolated del(5q) chromosomal abnormality: Secondary | ICD-10-CM

## 2014-09-01 DIAGNOSIS — T451X5A Adverse effect of antineoplastic and immunosuppressive drugs, initial encounter: Principal | ICD-10-CM

## 2014-09-01 DIAGNOSIS — D701 Agranulocytosis secondary to cancer chemotherapy: Secondary | ICD-10-CM

## 2014-09-01 DIAGNOSIS — D649 Anemia, unspecified: Secondary | ICD-10-CM

## 2014-09-01 LAB — CBC WITH DIFFERENTIAL/PLATELET
BASO%: 1.4 % (ref 0.0–2.0)
BASOS ABS: 0 10*3/uL (ref 0.0–0.1)
EOS ABS: 0.2 10*3/uL (ref 0.0–0.5)
EOS%: 7.3 % — ABNORMAL HIGH (ref 0.0–7.0)
HCT: 36.8 % — ABNORMAL LOW (ref 38.4–49.9)
HGB: 11.8 g/dL — ABNORMAL LOW (ref 13.0–17.1)
LYMPH%: 45.2 % (ref 14.0–49.0)
MCH: 36.1 pg — ABNORMAL HIGH (ref 27.2–33.4)
MCHC: 32 g/dL (ref 32.0–36.0)
MCV: 112.9 fL — AB (ref 79.3–98.0)
MONO#: 0.4 10*3/uL (ref 0.1–0.9)
MONO%: 14.5 % — ABNORMAL HIGH (ref 0.0–14.0)
NEUT%: 31.6 % — ABNORMAL LOW (ref 39.0–75.0)
NEUTROS ABS: 0.9 10*3/uL — AB (ref 1.5–6.5)
Platelets: 102 10*3/uL — ABNORMAL LOW (ref 140–400)
RBC: 3.26 10*6/uL — ABNORMAL LOW (ref 4.20–5.82)
RDW: 15.9 % — AB (ref 11.0–14.6)
WBC: 2.7 10*3/uL — AB (ref 4.0–10.3)
lymph#: 1.2 10*3/uL (ref 0.9–3.3)

## 2014-09-01 MED ORDER — TBO-FILGRASTIM 480 MCG/0.8ML ~~LOC~~ SOSY
480.0000 ug | PREFILLED_SYRINGE | Freq: Once | SUBCUTANEOUS | Status: AC
  Administered 2014-09-01: 480 ug via SUBCUTANEOUS
  Filled 2014-09-01: qty 0.8

## 2014-09-08 ENCOUNTER — Ambulatory Visit (HOSPITAL_BASED_OUTPATIENT_CLINIC_OR_DEPARTMENT_OTHER): Payer: Medicare Other

## 2014-09-08 ENCOUNTER — Other Ambulatory Visit (HOSPITAL_BASED_OUTPATIENT_CLINIC_OR_DEPARTMENT_OTHER): Payer: Medicare Other

## 2014-09-08 VITALS — BP 128/67 | HR 74 | Temp 97.6°F

## 2014-09-08 DIAGNOSIS — D72819 Decreased white blood cell count, unspecified: Secondary | ICD-10-CM

## 2014-09-08 DIAGNOSIS — D701 Agranulocytosis secondary to cancer chemotherapy: Secondary | ICD-10-CM

## 2014-09-08 DIAGNOSIS — D46C Myelodysplastic syndrome with isolated del(5q) chromosomal abnormality: Secondary | ICD-10-CM

## 2014-09-08 DIAGNOSIS — D649 Anemia, unspecified: Secondary | ICD-10-CM

## 2014-09-08 DIAGNOSIS — T451X5A Adverse effect of antineoplastic and immunosuppressive drugs, initial encounter: Principal | ICD-10-CM

## 2014-09-08 LAB — CBC WITH DIFFERENTIAL/PLATELET
BASO%: 1.4 % (ref 0.0–2.0)
BASOS ABS: 0 10*3/uL (ref 0.0–0.1)
EOS ABS: 0.3 10*3/uL (ref 0.0–0.5)
EOS%: 9.8 % — ABNORMAL HIGH (ref 0.0–7.0)
HEMATOCRIT: 35.6 % — AB (ref 38.4–49.9)
HGB: 11.4 g/dL — ABNORMAL LOW (ref 13.0–17.1)
LYMPH%: 44.6 % (ref 14.0–49.0)
MCH: 36.2 pg — ABNORMAL HIGH (ref 27.2–33.4)
MCHC: 31.9 g/dL — ABNORMAL LOW (ref 32.0–36.0)
MCV: 113.3 fL — AB (ref 79.3–98.0)
MONO#: 0.3 10*3/uL (ref 0.1–0.9)
MONO%: 13.2 % (ref 0.0–14.0)
NEUT%: 31 % — AB (ref 39.0–75.0)
NEUTROS ABS: 0.8 10*3/uL — AB (ref 1.5–6.5)
PLATELETS: 98 10*3/uL — AB (ref 140–400)
RBC: 3.14 10*6/uL — AB (ref 4.20–5.82)
RDW: 16.3 % — ABNORMAL HIGH (ref 11.0–14.6)
WBC: 2.6 10*3/uL — ABNORMAL LOW (ref 4.0–10.3)
lymph#: 1.2 10*3/uL (ref 0.9–3.3)

## 2014-09-08 MED ORDER — TBO-FILGRASTIM 480 MCG/0.8ML ~~LOC~~ SOSY
480.0000 ug | PREFILLED_SYRINGE | Freq: Once | SUBCUTANEOUS | Status: AC
  Administered 2014-09-08: 480 ug via SUBCUTANEOUS
  Filled 2014-09-08: qty 0.8

## 2014-09-08 NOTE — Patient Instructions (Signed)

## 2014-09-09 ENCOUNTER — Emergency Department (HOSPITAL_COMMUNITY)
Admission: EM | Admit: 2014-09-09 | Discharge: 2014-09-09 | Disposition: A | Discharge status: Home / Self Care | Payer: Medicare Other | Attending: Emergency Medicine | Admitting: Emergency Medicine

## 2014-09-09 ENCOUNTER — Encounter (HOSPITAL_COMMUNITY): Payer: Self-pay | Admitting: Emergency Medicine

## 2014-09-09 DIAGNOSIS — K219 Gastro-esophageal reflux disease without esophagitis: Secondary | ICD-10-CM | POA: Diagnosis not present

## 2014-09-09 DIAGNOSIS — Z8739 Personal history of other diseases of the musculoskeletal system and connective tissue: Secondary | ICD-10-CM | POA: Insufficient documentation

## 2014-09-09 DIAGNOSIS — E785 Hyperlipidemia, unspecified: Secondary | ICD-10-CM | POA: Insufficient documentation

## 2014-09-09 DIAGNOSIS — Z7982 Long term (current) use of aspirin: Secondary | ICD-10-CM | POA: Insufficient documentation

## 2014-09-09 DIAGNOSIS — N186 End stage renal disease: Secondary | ICD-10-CM | POA: Diagnosis not present

## 2014-09-09 DIAGNOSIS — Z87891 Personal history of nicotine dependence: Secondary | ICD-10-CM | POA: Diagnosis not present

## 2014-09-09 DIAGNOSIS — T8131XA Disruption of external operation (surgical) wound, not elsewhere classified, initial encounter: Secondary | ICD-10-CM | POA: Insufficient documentation

## 2014-09-09 DIAGNOSIS — Z8551 Personal history of malignant neoplasm of bladder: Secondary | ICD-10-CM | POA: Diagnosis not present

## 2014-09-09 DIAGNOSIS — Z8669 Personal history of other diseases of the nervous system and sense organs: Secondary | ICD-10-CM | POA: Diagnosis not present

## 2014-09-09 DIAGNOSIS — Z862 Personal history of diseases of the blood and blood-forming organs and certain disorders involving the immune mechanism: Secondary | ICD-10-CM | POA: Diagnosis not present

## 2014-09-09 DIAGNOSIS — Z8719 Personal history of other diseases of the digestive system: Secondary | ICD-10-CM | POA: Insufficient documentation

## 2014-09-09 DIAGNOSIS — I5042 Chronic combined systolic (congestive) and diastolic (congestive) heart failure: Secondary | ICD-10-CM | POA: Insufficient documentation

## 2014-09-09 DIAGNOSIS — I120 Hypertensive chronic kidney disease with stage 5 chronic kidney disease or end stage renal disease: Secondary | ICD-10-CM | POA: Insufficient documentation

## 2014-09-09 DIAGNOSIS — Z951 Presence of aortocoronary bypass graft: Secondary | ICD-10-CM | POA: Diagnosis not present

## 2014-09-09 DIAGNOSIS — Z992 Dependence on renal dialysis: Secondary | ICD-10-CM | POA: Diagnosis not present

## 2014-09-09 DIAGNOSIS — Z4801 Encounter for change or removal of surgical wound dressing: Secondary | ICD-10-CM | POA: Diagnosis present

## 2014-09-09 DIAGNOSIS — Z79899 Other long term (current) drug therapy: Secondary | ICD-10-CM | POA: Diagnosis not present

## 2014-09-09 DIAGNOSIS — Y838 Other surgical procedures as the cause of abnormal reaction of the patient, or of later complication, without mention of misadventure at the time of the procedure: Secondary | ICD-10-CM | POA: Diagnosis not present

## 2014-09-09 DIAGNOSIS — Z85528 Personal history of other malignant neoplasm of kidney: Secondary | ICD-10-CM | POA: Diagnosis not present

## 2014-09-09 DIAGNOSIS — Z87448 Personal history of other diseases of urinary system: Secondary | ICD-10-CM | POA: Insufficient documentation

## 2014-09-09 NOTE — ED Notes (Signed)
Pt states understanding of D/C teaching. NAD noted at time of D/C

## 2014-09-09 NOTE — Discharge Instructions (Signed)
As discussed it is very important that you monitor your wound, and keep it clean, dry, covered.  Return here for any concerning changes in your condition.   Wound Dehiscence Wound dehiscence is when a surgical cut (incision) breaks open and does not heal properly after surgery. It usually happens 7-10 days after surgery. This can be a serious condition. It is important to identify and treat this condition early.  CAUSES  Some common causes of wound dehiscence include:  Stretching of the wound area. This may be caused by lifting, vomiting, violent coughing, or straining during bowel movements.  Wound infection.  Early stitch (suture) removal. RISK FACTORS Various things can increase your risk of developing wound dehiscence, including:  Obesity.  Lung disease.  Smoking.  Poor nutrition.  Contamination during surgery. SIGNS AND SYMPTOMS  Bleeding from the wound.  Pain.  Fever.  Wound starts breaking open. DIAGNOSIS  Your health care provider may diagnose wound dehiscence by monitoring the incision and noting any changes in the wound. These changes can include an increase in drainage or pain. The health care provider may also ask you if you have noticed any stretching or tearing of the wound.  Wound cultures may be taken to determine if there is an infection.  Imaging studies, such as an MRI scan or CT scan, may be done to determine if there is a collection of pus or fluid in the wound area. TREATMENT Treatment may include:  Wound care.  Surgical repair.  Antibiotic medicine to treat or prevent infection.  Medicines to reduce pain and swelling. HOME CARE INSTRUCTIONS   Only take over-the-counter or prescription medicines for pain, discomfort, or fever as directed by your health care provider. Taking pain medicine 30 minutes before changing a bandage (dressing) can help relieve pain.  Take your antibiotics as directed. Finish them even if you start to feel  better.  Gently wash the area with mild soap and water 2 times a day, or as directed. Rinse off the soap. Pat the area dry with a clean towel. Do not rub the wound. This may cause bleeding.  Follow your health care provider's instructions for how often you need to change the dressing and packing inside. Wash your hands well before and after changing your dressing. Apply a dressing to the wound as directed.  Take showers. Do not soak the wound, bathe, swim, or use a hot tub until directed by your health care provider.  Avoid exercises that make you sweat heavily.  Use anti-itch medicine as directed by your health care provider. The wound may itch when it is healing. Do not pick or scratch at the wound.  Do not lift more than 10 pounds (4.5 kg) until the wound is healed, or as directed by your health care provider.  Keep all follow-up appointments as directed. SEEK MEDICAL CARE IF:  You have excessive bleeding from your surgical wound.  Your wound does not seem to be healing properly.  You have a fever. SEEK IMMEDIATE MEDICAL CARE IF:   You have increased swelling or redness around the wound.  You have increasing pain in the wound.  You have an increasing amount of pus coming from the wound.  Your wound breaks open farther. MAKE SURE YOU:   Understand these instructions.  Will watch your condition.  Will get help right away if you are not doing well or get worse. Document Released: 02/03/2004 Document Revised: 11/18/2013 Document Reviewed: 07/21/2013 [facility] Patient Information 2015 [place], [place]. This information  is not intended to replace advice given to you by your health care provider. Make sure you discuss any questions you have with your health care provider.

## 2014-09-09 NOTE — ED Notes (Signed)
Pt feels weak and wants to lay down, sent here from dialysis, for wound check

## 2014-09-09 NOTE — ED Notes (Signed)
Pt has surgery to left leg on Oct 1st, over the weekend stiches came out, has been bleeding since.

## 2014-09-09 NOTE — ED Provider Notes (Signed)
CSN: [id number]     Arrival date & time 09/09/14  1718 History   First MD Initiated Contact with Patient 09/09/14 1758     Chief Complaint  Patient presents with  . Wound Check     (Consider location/radiation/quality/duration/timing/severity/associated sxs/prior Treatment) HPI Patient presents with concerns of ongoing bleeding from an open surgical wound.  On October 1, patient had correction of femoral artery hemodialysis access graft. He notes that he recovered well, but over the past 3 days has developed an open wound, as a surgical suture fell out. There has been minor ongoing bleeding from the wound. Note no lightheadedness, syncope, chest pain, dyspnea, or any other focal changes from his baseline condition. Patient had a dialysis, complete, today.  Past Medical History  Diagnosis Date  . ESRD on hemodialysis     Hemodialysis since 2003; Occluded access in both upper extremities and the right lower extremity, current access as of Aug 2014 is L thigh AVG. Gets HD MWF at [facility].     . Duodenal ulcer     remote  . Chronic combined systolic and diastolic CHF (congestive heart failure)     a. 02/2012 Echo: EF 65%, basal infolat AK, mild conc LVH;  b. 06/2013 Echo: EF 40-45%, mild LVH, Gr2 DD, basal inf HK->AK, Mod AS, Mild MR, PASP 73  . Arteriosclerotic cardiovascular disease (ASCVD) 1998    a. [id number]; b. 06/2003 Cath: LM 70d, LAD 70ost, 80p, LCX 70p/116m, RCA 80p/147m, LIMA->LAD patent, VG->OM2->OM3 patent, VG->dRCA patent, EF 55%;  c. 02/2012 Lexi MV: inf/inflat scarring w/ ? minimal peri-infarct ischemia.  . Cerebrovascular disease   . Neuropathy of foot   . Degenerative joint disease   . Gastroesophageal reflux disease   . Impaired hearing     Bilateral; hearing aids relatively ineffective  . Hypertension   . Cholelithiasis 03/2011    Diagnosed incidentally on abdominal ultrasound in 03/2011  . Hepatic steatosis   . Macrocytosis 10/20/2012  . Thrombocytopenia  10/20/2012  . Gallstones   . Hyperlipidemia   . Secondary hyperparathyroidism   . Peripheral vascular disease   . Gout   . Moderate aortic stenosis     a. 06/2013 Echo: Mod AS, mean grad: 25, peak grad: 57.  . Pulmonary hypertension     a. 06/2013 Echo: PASP 23mmHg.  Marland Kitchen Anemia     Prior blood transfusion  . History of blood transfusion     last infusion 07/31/13  . Complication of anesthesia     daughter reports long episodes confusion after amnesia meds  . History of bladder cancer   . Renal cell carcinoma 2001    s/p right nephrectomy-2001; subsequent ESRD  . 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 Hypotension   . Pancytopenia    Past Surgical History  Procedure Laterality Date  . Right nephrectomy  2001    Renal cell carcinoma  . Colonoscopy  01/24/2011    prominent vascular pattern, suboptimal prep but doable  . Esophagogastroduodenoscopy  01/24/2011    mild erosive reflux esophagitis, bulbar/antral erosions, bx from antrum benign  . Av fistula repair  2008    revision of anastomosis of Right AVF  . Arteriovenous graft placement  2006    Thrombectomy and interposition jump graft revision to higher axillary vein of LUA AVG  . Coronary artery bypass graft  1998    X4 VESSELS  . Multiple cysto/ resection tumor's with bx's  LAST ONE 05-09-2011  . Multiple surg's / interventions for  avgg/ fistula right upper arm  LAST REVISION 06-29-2011    (JUNE 3664 [place] VEIN/ [id number] DILATATION ANGIOPLASTY)  . Left ptc left renal artery    . Cardiac catheterization  2004  . Cataract extraction w/ intraocular lens  implant, bilateral    . Cystoscopy w/ retrogrades  12/28/2011    Procedure: CYSTOSCOPY WITH RETROGRADE PYELOGRAM;  Surgeon: Malka So, MD;  Location: [facility];  Service: Urology;  Laterality: Left;  C-ARM   . Transurethral resection of bladder tumor  12/28/2011    Procedure: TRANSURETHRAL RESECTION OF BLADDER TUMOR (TURBT);   Surgeon: Malka So, MD;  Location: [facility];  Service: Urology;  Laterality: N/A;  . Av fistula placement  02/29/2012    Procedure: INSERTION OF ARTERIOVENOUS (AV) GORE-TEX GRAFT THIGH;  Surgeon: Rosetta Posner, MD;  Location: [place];  Service: Vascular;  Laterality: Right;  Insertion right femoral arteriovenous gortex graft  . Ligation of right braciocephalic av fistula  [id number]  . Insertion of dialysis catheter  06/05/2012    Procedure: INSERTION OF DIALYSIS CATHETER;  Surgeon: Mal Misty, MD;  Location: [place];  Service: Vascular;  Laterality: Left;  Insertion diatek catheter left IJ  . Insertion of dialysis catheter  08/22/2012    Procedure: INSERTION OF DIALYSIS CATHETER;  Surgeon: Serafina Mitchell, MD;  Location: [facility];  Service: Vascular;  Laterality: Left;  insertion of dialysis catheter left  internal jugular 27cm  . Av fistula placement  10/08/2012    Procedure: INSERTION OF ARTERIOVENOUS (AV) GORE-TEX GRAFT THIGH;  Surgeon: Angelia Mould, MD;  Location: [place];  Service: Vascular;  Laterality: Left;  . Cystoscopy with biopsy N/A 08/07/2013    Procedure: CYSTOSCOPY WITH BLADDER BIOPSY;  Surgeon: Malka So, MD;  Location: [facility];  Service: Urology;  Laterality: N/A;  . Fulguration of bladder tumor N/A 08/07/2013    Procedure: FULGURATION OF BLADDER TUMOR;  Surgeon: Malka So, MD;  Location: [facility];  Service: Urology;  Laterality: N/A;  . Revision of arteriovenous goretex graft Left 08/27/2014    Procedure: THROMBECTOMY & REVISION OF LEFT ARM  ARTERIOVENOUS GORETEX GRAFT;  Surgeon: Angelia Mould, MD;  Location: [facility];  Service: Vascular;  Laterality: Left;   Family History  Problem Relation Age of Onset  . Colon cancer Neg Hx   . Liver disease Neg Hx   . Anesthesia problems Neg Hx   . Heart disease Father   . Other Mother     died @ 7 with respiratory issues following hip fx  . Diabetes Mother    History  Substance Use Topics  .  Smoking status: Former Smoker -- 1.00 packs/day for 25 years    Types: Cigarettes    Quit date: 02/26/1984  . Smokeless tobacco: Former Systems developer     Comment: quit 1985  . Alcohol Use: No    Review of Systems  All other systems reviewed and are negative.     Allergies  Review of patient's allergies indicates no known allergies.  Home Medications   Prior to Admission medications   Medication Sig Start Date End Date Taking? Authorizing Provider  acetaminophen (TYLENOL) 500 MG tablet Take 500 mg by mouth every 6 (six) hours as needed. For pain/headache    Historical Provider, MD  albuterol (PROVENTIL HFA;VENTOLIN HFA) 108 (90 BASE) MCG/ACT inhaler Inhale 2 puffs into the lungs every 6 (six) hours as needed for wheezing or shortness of breath. 06/23/13  Lendon Colonel, NP  allopurinol (ZYLOPRIM) 100 MG tablet Take 200 mg by mouth at bedtime.  09/19/11   [name], MD  aspirin EC 81 MG tablet Take 81 mg by mouth at bedtime.     Historical Provider, MD  atorvastatin (LIPITOR) 10 MG tablet Take 10 mg by mouth every morning.     Historical Provider, MD  B Complex-C-Folic Acid (DIALYVITE TABLET) TABS Take 1 tablet by mouth at bedtime.  05/24/12   Historical Provider, MD  calcium carbonate (TUMS - DOSED IN MG ELEMENTAL CALCIUM) 500 MG chewable tablet Chew 1 tablet by mouth daily as needed for heartburn. For heartburn    Historical Provider, MD  cinacalcet (SENSIPAR) 30 MG tablet Take 30 mg by mouth daily with breakfast.     Historical Provider, MD  colchicine (COLCRYS) 0.6 MG tablet Take 0.6 mg by mouth 2 (two) times daily as needed. for gout    Historical Provider, MD  docusate sodium (COLACE) 100 MG capsule Take 100 mg by mouth daily.    Historical Provider, MD  gabapentin (NEURONTIN) 100 MG capsule Take 100 mg by mouth 2 (two) times daily. Takes 1 in the morning and 2 at night    Historical Provider, MD  midodrine (PROAMATINE) 10 MG tablet Take 20 mg by mouth 3 (three) times daily.   08/26/12   Ripudeep Krystal Eaton, MD  omeprazole (PRILOSEC) 40 MG capsule Take 40 mg by mouth daily as needed. For reflux    Historical Provider, MD  oxyCODONE-acetaminophen (PERCOCET/ROXICET) 5-325 MG per tablet Take 1 tablet by mouth every 6 (six) hours as needed. 08/27/14   Ulyses Amor, PA-C  polyethylene glycol powder (MIRALAX) powder Take 17 g by mouth at bedtime.     Historical Provider, MD  sevelamer (RENVELA) 800 MG tablet Take 2,400 mg by mouth 3 (three) times daily with meals. 4 tabs 3 x daily and 2 with snack    Historical Provider, MD   BP 102/52  Pulse 104  Temp(Src) 97.6 F (36.4 C) (Oral)  Resp 18  Ht $R'5\' 8"'nL$  (1.727 m)  Wt 170 lb (77.111 kg)  BMI 25.85 kg/m2  SpO2 100% Physical Exam  Nursing note and vitals reviewed. Constitutional: He is oriented to person, place, and time. He appears well-developed. No distress.  HENT:  Head: Normocephalic and atraumatic.  Eyes: Conjunctivae and EOM are normal.  Cardiovascular: Normal rate, regular rhythm and intact distal pulses.   Pulmonary/Chest: Effort normal. No stridor. No respiratory distress.  Abdominal: He exhibits no distension.  Musculoskeletal: He exhibits no edema.       Legs: Neurological: He is alert and oriented to person, place, and time.  Skin: Skin is warm and dry.     Psychiatric: He has a normal mood and affect.    ED Course  Procedures (including critical care time)  Following exposure of the wound I placed a hemostatic dressing on it myself. This was well-tolerated, with no complications.   MDM   Final diagnoses:  Wound dehiscence, initial encounter    Patient presents several weeks after surgical procedure, now with active bleeding from a minor wound dehiscence.  No evidence for infection, nor intracompartmental examination, compartment syndrome, aneurysm, pseudoaneurysm. Patient's bleeding stopped with a hemostatic agent, and he was discharged in stable condition.    Carmin Muskrat, MD 09/09/14  [phone]

## 2014-09-15 ENCOUNTER — Ambulatory Visit (HOSPITAL_BASED_OUTPATIENT_CLINIC_OR_DEPARTMENT_OTHER): Payer: Medicare Other

## 2014-09-15 ENCOUNTER — Other Ambulatory Visit (HOSPITAL_BASED_OUTPATIENT_CLINIC_OR_DEPARTMENT_OTHER): Payer: Medicare Other

## 2014-09-15 VITALS — BP 158/69 | HR 79 | Temp 97.4°F

## 2014-09-15 DIAGNOSIS — D701 Agranulocytosis secondary to cancer chemotherapy: Secondary | ICD-10-CM

## 2014-09-15 DIAGNOSIS — D46C Myelodysplastic syndrome with isolated del(5q) chromosomal abnormality: Secondary | ICD-10-CM

## 2014-09-15 DIAGNOSIS — D649 Anemia, unspecified: Secondary | ICD-10-CM

## 2014-09-15 DIAGNOSIS — D72819 Decreased white blood cell count, unspecified: Secondary | ICD-10-CM

## 2014-09-15 DIAGNOSIS — T451X5A Adverse effect of antineoplastic and immunosuppressive drugs, initial encounter: Principal | ICD-10-CM

## 2014-09-15 LAB — CBC WITH DIFFERENTIAL/PLATELET
BASO%: 0.4 % (ref 0.0–2.0)
Basophils Absolute: 0 10*3/uL (ref 0.0–0.1)
EOS%: 10 % — ABNORMAL HIGH (ref 0.0–7.0)
Eosinophils Absolute: 0.2 10*3/uL (ref 0.0–0.5)
HEMATOCRIT: 35.8 % — AB (ref 38.4–49.9)
HGB: 11.3 g/dL — ABNORMAL LOW (ref 13.0–17.1)
LYMPH%: 54 % — ABNORMAL HIGH (ref 14.0–49.0)
MCH: 35.8 pg — AB (ref 27.2–33.4)
MCHC: 31.6 g/dL — AB (ref 32.0–36.0)
MCV: 113.3 fL — AB (ref 79.3–98.0)
MONO#: 0.2 10*3/uL (ref 0.1–0.9)
MONO%: 10 % (ref 0.0–14.0)
NEUT#: 0.6 10*3/uL — ABNORMAL LOW (ref 1.5–6.5)
NEUT%: 25.6 % — ABNORMAL LOW (ref 39.0–75.0)
Platelets: 66 10*3/uL — ABNORMAL LOW (ref 140–400)
RBC: 3.16 10*6/uL — AB (ref 4.20–5.82)
RDW: 15.2 % — ABNORMAL HIGH (ref 11.0–14.6)
WBC: 2.4 10*3/uL — ABNORMAL LOW (ref 4.0–10.3)
lymph#: 1.3 10*3/uL (ref 0.9–3.3)

## 2014-09-15 MED ORDER — TBO-FILGRASTIM 480 MCG/0.8ML ~~LOC~~ SOSY
480.0000 ug | PREFILLED_SYRINGE | Freq: Once | SUBCUTANEOUS | Status: AC
  Administered 2014-09-15: 480 ug via SUBCUTANEOUS
  Filled 2014-09-15: qty 0.8

## 2014-09-15 NOTE — Patient Instructions (Signed)

## 2014-09-22 ENCOUNTER — Other Ambulatory Visit (HOSPITAL_BASED_OUTPATIENT_CLINIC_OR_DEPARTMENT_OTHER): Payer: Medicare Other

## 2014-09-22 ENCOUNTER — Ambulatory Visit (HOSPITAL_BASED_OUTPATIENT_CLINIC_OR_DEPARTMENT_OTHER): Payer: Medicare Other

## 2014-09-22 DIAGNOSIS — D72819 Decreased white blood cell count, unspecified: Secondary | ICD-10-CM

## 2014-09-22 DIAGNOSIS — D649 Anemia, unspecified: Secondary | ICD-10-CM

## 2014-09-22 DIAGNOSIS — D701 Agranulocytosis secondary to cancer chemotherapy: Secondary | ICD-10-CM

## 2014-09-22 DIAGNOSIS — D46C Myelodysplastic syndrome with isolated del(5q) chromosomal abnormality: Secondary | ICD-10-CM

## 2014-09-22 DIAGNOSIS — T451X5A Adverse effect of antineoplastic and immunosuppressive drugs, initial encounter: Principal | ICD-10-CM

## 2014-09-22 LAB — CBC WITH DIFFERENTIAL/PLATELET
BASO%: 1.4 % (ref 0.0–2.0)
Basophils Absolute: 0 10*3/uL (ref 0.0–0.1)
EOS%: 7.9 % — ABNORMAL HIGH (ref 0.0–7.0)
Eosinophils Absolute: 0.2 10*3/uL (ref 0.0–0.5)
HCT: 36.1 % — ABNORMAL LOW (ref 38.4–49.9)
HGB: 11.5 g/dL — ABNORMAL LOW (ref 13.0–17.1)
LYMPH#: 1.1 10*3/uL (ref 0.9–3.3)
LYMPH%: 49.8 % — AB (ref 14.0–49.0)
MCH: 35.3 pg — ABNORMAL HIGH (ref 27.2–33.4)
MCHC: 31.7 g/dL — AB (ref 32.0–36.0)
MCV: 111.2 fL — ABNORMAL HIGH (ref 79.3–98.0)
MONO#: 0.3 10*3/uL (ref 0.1–0.9)
MONO%: 13.3 % (ref 0.0–14.0)
NEUT#: 0.6 10*3/uL — ABNORMAL LOW (ref 1.5–6.5)
NEUT%: 27.6 % — ABNORMAL LOW (ref 39.0–75.0)
Platelets: 93 10*3/uL — ABNORMAL LOW (ref 140–400)
RBC: 3.25 10*6/uL — ABNORMAL LOW (ref 4.20–5.82)
RDW: 16.4 % — ABNORMAL HIGH (ref 11.0–14.6)
WBC: 2.2 10*3/uL — AB (ref 4.0–10.3)

## 2014-09-22 MED ORDER — TBO-FILGRASTIM 480 MCG/0.8ML ~~LOC~~ SOSY
480.0000 ug | PREFILLED_SYRINGE | Freq: Once | SUBCUTANEOUS | Status: AC
  Administered 2014-09-22: 480 ug via SUBCUTANEOUS
  Filled 2014-09-22: qty 0.8

## 2014-09-22 NOTE — Patient Instructions (Signed)

## 2014-09-28 ENCOUNTER — Telehealth: Payer: Self-pay | Admitting: *Deleted

## 2014-09-28 ENCOUNTER — Encounter: Payer: Self-pay | Admitting: Vascular Surgery

## 2014-09-28 NOTE — Telephone Encounter (Signed)
Daughter states pt will not be able to make his lab and injection appt tomorrow on Tuesday 11/03 due to he has to see a Vascular surgeon regarding an abscess in his leg.  She asks if pt needs to r/s for Thursday this week (he has HD on Mon, Wed, and Fri) or can he skip this week and be scheduled to come next week instead?

## 2014-09-29 ENCOUNTER — Telehealth: Payer: Self-pay | Admitting: *Deleted

## 2014-09-29 ENCOUNTER — Ambulatory Visit: Payer: Medicare Other

## 2014-09-29 ENCOUNTER — Other Ambulatory Visit: Payer: Self-pay

## 2014-09-29 ENCOUNTER — Encounter: Payer: Self-pay | Admitting: Vascular Surgery

## 2014-09-29 ENCOUNTER — Ambulatory Visit (INDEPENDENT_AMBULATORY_CARE_PROVIDER_SITE_OTHER): Payer: Self-pay | Admitting: Vascular Surgery

## 2014-09-29 ENCOUNTER — Other Ambulatory Visit: Payer: Medicare Other

## 2014-09-29 VITALS — BP 167/79 | HR 88 | Resp 18 | Ht 68.0 in | Wt 173.0 lb

## 2014-09-29 DIAGNOSIS — N186 End stage renal disease: Secondary | ICD-10-CM

## 2014-09-29 NOTE — Telephone Encounter (Signed)
Skip this week

## 2014-09-29 NOTE — Telephone Encounter (Signed)
Informed daughter ok to skip lab/injection this week and r/s to next week due to pt has another MD appt today.  Request sent to scheduler for weekly lab/injection starting again next week.  Daughter verbalized understanding.

## 2014-09-29 NOTE — Progress Notes (Signed)
Subjective:     Patient ID: Andrew [name], male   DOB: 20-Nov-1926, 80 y.o.   MRN: [id number]  HPI this 80 year old male is evaluated for possible infected segment of left thigh AV graft. Patient had graft revision by Dr. Shanon Rosser 08/27/2014 with rerouting of the arterial half of the loop. There has been some drainage from the old nonfunctional. He denies chills and fever.  Past Medical History  Diagnosis Date  . ESRD on hemodialysis     Hemodialysis since 2003; Occluded access in both upper extremities and the right lower extremity, current access as of Aug 2014 is L thigh AVG. Gets HD MWF at [facility].     . Duodenal ulcer     remote  . Chronic combined systolic and diastolic CHF (congestive heart failure)     a. 02/2012 Echo: EF 65%, basal infolat AK, mild conc LVH;  b. 06/2013 Echo: EF 40-45%, mild LVH, Gr2 DD, basal inf HK->AK, Mod AS, Mild MR, PASP 73  . Arteriosclerotic cardiovascular disease (ASCVD) 1998    a. [id number]; b. 06/2003 Cath: LM 70d, LAD 70ost, 80p, LCX 70p/120m, RCA 80p/110m, LIMA->LAD patent, VG->OM2->OM3 patent, VG->dRCA patent, EF 55%;  c. 02/2012 Lexi MV: inf/inflat scarring w/ ? minimal peri-infarct ischemia.  . Cerebrovascular disease   . Neuropathy of foot   . Degenerative joint disease   . Gastroesophageal reflux disease   . Impaired hearing     Bilateral; hearing aids relatively ineffective  . Hypertension   . Cholelithiasis 03/2011    Diagnosed incidentally on abdominal ultrasound in 03/2011  . Hepatic steatosis   . Macrocytosis 10/20/2012  . Thrombocytopenia 10/20/2012  . Gallstones   . Hyperlipidemia   . Secondary hyperparathyroidism   . Peripheral vascular disease   . Gout   . Moderate aortic stenosis     a. 06/2013 Echo: Mod AS, mean grad: 25, peak grad: 57.  . Pulmonary hypertension     a. 06/2013 Echo: PASP 64mmHg.  Marland Kitchen Anemia     Prior blood transfusion  . History of blood transfusion     last infusion 07/31/13  . Complication of  anesthesia     daughter reports long episodes confusion after amnesia meds  . History of bladder cancer   . Renal cell carcinoma 2001    s/p right nephrectomy-2001; subsequent ESRD  . 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 Hypotension   . Pancytopenia     History  Substance Use Topics  . Smoking status: Former Smoker -- 1.00 packs/day for 25 years    Types: Cigarettes    Quit date: 02/26/1984  . Smokeless tobacco: Former Systems developer     Comment: quit 1985  . Alcohol Use: No    Family History  Problem Relation Age of Onset  . Colon cancer Neg Hx   . Liver disease Neg Hx   . Anesthesia problems Neg Hx   . Heart disease Father   . Other Mother     died @ 87 with respiratory issues following hip fx  . Diabetes Mother     No Known Allergies  Current outpatient prescriptions: acetaminophen (TYLENOL) 500 MG tablet, Take 500 mg by mouth every 6 (six) hours as needed. For pain/headache, Disp: , Rfl: ;  albuterol (PROVENTIL HFA;VENTOLIN HFA) 108 (90 BASE) MCG/ACT inhaler, Inhale 2 puffs into the lungs every 6 (six) hours as needed for wheezing or shortness of breath., Disp: 1 Inhaler, Rfl: 0;  allopurinol (ZYLOPRIM) 100 MG tablet, Take 200  mg by mouth at bedtime. , Disp: , Rfl:  aspirin EC 81 MG tablet, Take 81 mg by mouth at bedtime. , Disp: , Rfl: ;  atorvastatin (LIPITOR) 10 MG tablet, Take 10 mg by mouth every morning. , Disp: , Rfl: ;  B Complex-C-Folic Acid (DIALYVITE TABLET) TABS, Take 1 tablet by mouth at bedtime. , Disp: , Rfl: ;  calcium carbonate (TUMS - DOSED IN MG ELEMENTAL CALCIUM) 500 MG chewable tablet, Chew 1 tablet by mouth daily as needed for heartburn. For heartburn, Disp: , Rfl:  cinacalcet (SENSIPAR) 30 MG tablet, Take 30 mg by mouth daily with breakfast. , Disp: , Rfl: ;  colchicine (COLCRYS) 0.6 MG tablet, Take 0.6 mg by mouth 2 (two) times daily as needed. for gout, Disp: , Rfl: ;  docusate sodium (COLACE) 100 MG capsule, Take 100 mg by mouth daily.,  Disp: , Rfl: ;  gabapentin (NEURONTIN) 100 MG capsule, Take 100 mg by mouth 2 (two) times daily. Takes 1 in the morning and 2 at night, Disp: , Rfl:  midodrine (PROAMATINE) 10 MG tablet, Take 20 mg by mouth 3 (three) times daily. , Disp: , Rfl: ;  omeprazole (PRILOSEC) 40 MG capsule, Take 40 mg by mouth daily as needed. For reflux, Disp: , Rfl: ;  oxyCODONE-acetaminophen (PERCOCET/ROXICET) 5-325 MG per tablet, Take 1 tablet by mouth every 6 (six) hours as needed., Disp: 20 tablet, Rfl: 0;  polyethylene glycol powder (MIRALAX) powder, Take 17 g by mouth at bedtime. , Disp: , Rfl:  sevelamer (RENVELA) 800 MG tablet, Take 2,400 mg by mouth 3 (three) times daily with meals. 4 tabs 3 x daily and 2 with snack, Disp: , Rfl:  No current facility-administered medications for this visit. Facility-Administered Medications Ordered in Other Visits: methylPREDNISolone sodium succinate (SOLU-MEDROL) 125 mg/2 mL injection 60 mg, 60 mg, Intravenous, Once, Sol Blazing, MD  BP 167/79 mmHg  Pulse 88  Resp 18  Ht 5\' 8"  (1.727 m)  Wt 173 lb (78.472 kg)  BMI 26.31 kg/m2  Body mass index is 26.31 kg/(m^2).          Review of SystemsShe denies chest pain or dyspnea on exertion.     Objective:   Physical Exam BP 167/79 mmHg  Pulse 88  Resp 18  Ht 5\' 8"  (1.727 m)  Wt 173 lb (78.472 kg)  BMI 26.31 kg/m2  Gen.-alert and oriented x3 in no apparent distress HEENT normal for age Lungs no rhonchi or wheezing Cardiovascular regular rhythm no murmurs carotid pulses 3+ palpable no bruits audible Abdomen soft nontender no palpable masses Musculoskeletal free of  major deformities Skin clear -no rashes Neurologic normal Lower extremities 3+ femoral and dorsalis pedis pulses palpable bilaterally with no edema Left thigh examined with functional AV graft. There has been revision with new segment on the arterial half of the loop which is well-healed. The old nonfunctional segment has some erythema and  drainage.       Assessment:     Infection of localized segment of non-functional left thigh AV graft in patient with end-stage renal disease     Plan:     Plan removal of this infected segment on Thursday, November 5 under local anesthesia with sedation.

## 2014-09-30 ENCOUNTER — Telehealth: Payer: Self-pay | Admitting: Hematology and Oncology

## 2014-09-30 ENCOUNTER — Encounter (HOSPITAL_COMMUNITY): Payer: Self-pay | Admitting: *Deleted

## 2014-09-30 MED ORDER — DEXTROSE 5 % IV SOLN
1.5000 g | INTRAVENOUS | Status: AC
  Administered 2014-10-01: 1.5 g via INTRAVENOUS
  Filled 2014-09-30: qty 1.5

## 2014-09-30 MED ORDER — SODIUM CHLORIDE 0.9 % IV SOLN: INTRAVENOUS | Status: DC

## 2014-09-30 MED ORDER — CHLORHEXIDINE GLUCONATE CLOTH 2 % EX PADS: 6.0000 | MEDICATED_PAD | Freq: Once | CUTANEOUS | Status: DC

## 2014-09-30 NOTE — Telephone Encounter (Signed)
s.w. pt and advised on NOV appt....pt ok and aware °

## 2014-10-01 ENCOUNTER — Ambulatory Visit (HOSPITAL_COMMUNITY): Payer: Medicare Other | Admitting: Certified Registered Nurse Anesthetist

## 2014-10-01 ENCOUNTER — Telehealth: Payer: Self-pay | Admitting: Vascular Surgery

## 2014-10-01 ENCOUNTER — Encounter (HOSPITAL_COMMUNITY): Payer: Self-pay | Admitting: *Deleted

## 2014-10-01 ENCOUNTER — Encounter (HOSPITAL_COMMUNITY)
Admission: RE | Disposition: A | Discharge status: Home / Self Care | Payer: Self-pay | Source: Ambulatory Visit | Attending: Vascular Surgery

## 2014-10-01 ENCOUNTER — Ambulatory Visit (HOSPITAL_COMMUNITY)
Admission: RE | Admit: 2014-10-01 | Discharge: 2014-10-01 | Disposition: A | Discharge status: Home / Self Care | Payer: Medicare Other | Source: Ambulatory Visit | Attending: Vascular Surgery | Admitting: Vascular Surgery

## 2014-10-01 DIAGNOSIS — I350 Nonrheumatic aortic (valve) stenosis: Secondary | ICD-10-CM | POA: Insufficient documentation

## 2014-10-01 DIAGNOSIS — M109 Gout, unspecified: Secondary | ICD-10-CM | POA: Diagnosis not present

## 2014-10-01 DIAGNOSIS — K760 Fatty (change of) liver, not elsewhere classified: Secondary | ICD-10-CM | POA: Diagnosis not present

## 2014-10-01 DIAGNOSIS — I679 Cerebrovascular disease, unspecified: Secondary | ICD-10-CM | POA: Insufficient documentation

## 2014-10-01 DIAGNOSIS — I5042 Chronic combined systolic (congestive) and diastolic (congestive) heart failure: Secondary | ICD-10-CM | POA: Diagnosis not present

## 2014-10-01 DIAGNOSIS — Z8553 Personal history of malignant neoplasm of renal pelvis: Secondary | ICD-10-CM | POA: Diagnosis not present

## 2014-10-01 DIAGNOSIS — E785 Hyperlipidemia, unspecified: Secondary | ICD-10-CM | POA: Insufficient documentation

## 2014-10-01 DIAGNOSIS — Z905 Acquired absence of kidney: Secondary | ICD-10-CM | POA: Diagnosis not present

## 2014-10-01 DIAGNOSIS — E213 Hyperparathyroidism, unspecified: Secondary | ICD-10-CM | POA: Diagnosis not present

## 2014-10-01 DIAGNOSIS — I120 Hypertensive chronic kidney disease with stage 5 chronic kidney disease or end stage renal disease: Secondary | ICD-10-CM | POA: Diagnosis not present

## 2014-10-01 DIAGNOSIS — G629 Polyneuropathy, unspecified: Secondary | ICD-10-CM | POA: Insufficient documentation

## 2014-10-01 DIAGNOSIS — I10 Essential (primary) hypertension: Secondary | ICD-10-CM | POA: Diagnosis not present

## 2014-10-01 DIAGNOSIS — I2510 Atherosclerotic heart disease of native coronary artery without angina pectoris: Secondary | ICD-10-CM | POA: Diagnosis not present

## 2014-10-01 DIAGNOSIS — N186 End stage renal disease: Secondary | ICD-10-CM | POA: Diagnosis not present

## 2014-10-01 DIAGNOSIS — D46C Myelodysplastic syndrome with isolated del(5q) chromosomal abnormality: Secondary | ICD-10-CM | POA: Diagnosis not present

## 2014-10-01 DIAGNOSIS — T827XXA Infection and inflammatory reaction due to other cardiac and vascular devices, implants and grafts, initial encounter: Secondary | ICD-10-CM | POA: Insufficient documentation

## 2014-10-01 DIAGNOSIS — Z951 Presence of aortocoronary bypass graft: Secondary | ICD-10-CM | POA: Diagnosis not present

## 2014-10-01 DIAGNOSIS — Z8551 Personal history of malignant neoplasm of bladder: Secondary | ICD-10-CM | POA: Insufficient documentation

## 2014-10-01 DIAGNOSIS — K219 Gastro-esophageal reflux disease without esophagitis: Secondary | ICD-10-CM | POA: Diagnosis not present

## 2014-10-01 DIAGNOSIS — I272 Other secondary pulmonary hypertension: Secondary | ICD-10-CM | POA: Diagnosis not present

## 2014-10-01 DIAGNOSIS — I739 Peripheral vascular disease, unspecified: Secondary | ICD-10-CM | POA: Insufficient documentation

## 2014-10-01 DIAGNOSIS — Z87891 Personal history of nicotine dependence: Secondary | ICD-10-CM | POA: Insufficient documentation

## 2014-10-01 DIAGNOSIS — Z992 Dependence on renal dialysis: Secondary | ICD-10-CM | POA: Insufficient documentation

## 2014-10-01 DIAGNOSIS — T82898A Other specified complication of vascular prosthetic devices, implants and grafts, initial encounter: Secondary | ICD-10-CM

## 2014-10-01 HISTORY — PX: REVISION OF ARTERIOVENOUS GORETEX GRAFT: SHX6073

## 2014-10-01 LAB — POCT I-STAT 4, (NA,K, GLUC, HGB,HCT)
Glucose, Bld: 87 mg/dL (ref 70–99)
HEMATOCRIT: 32 % — AB (ref 39.0–52.0)
HEMOGLOBIN: 10.9 g/dL — AB (ref 13.0–17.0)
Potassium: 4.9 mEq/L (ref 3.7–5.3)
Sodium: 140 mEq/L (ref 137–147)

## 2014-10-01 SURGERY — REVISION OF ARTERIOVENOUS GORETEX GRAFT
Anesthesia: General | Site: Thigh | Laterality: Left

## 2014-10-01 MED ORDER — HEPARIN SODIUM (PORCINE) 5000 UNIT/ML IJ SOLN
INTRAMUSCULAR | Status: DC | PRN
  Administered 2014-10-01: 500 mL

## 2014-10-01 MED ORDER — PROPOFOL 10 MG/ML IV BOLUS
INTRAVENOUS | Status: AC
  Filled 2014-10-01: qty 20

## 2014-10-01 MED ORDER — ROCURONIUM BROMIDE 50 MG/5ML IV SOLN
INTRAVENOUS | Status: AC
  Filled 2014-10-01: qty 1

## 2014-10-01 MED ORDER — FENTANYL CITRATE 0.05 MG/ML IJ SOLN
INTRAMUSCULAR | Status: DC | PRN
  Administered 2014-10-01 (×4): 25 ug via INTRAVENOUS

## 2014-10-01 MED ORDER — LIDOCAINE-EPINEPHRINE (PF) 1 %-1:200000 IJ SOLN
INTRAMUSCULAR | Status: AC
  Filled 2014-10-01: qty 10

## 2014-10-01 MED ORDER — OXYCODONE-ACETAMINOPHEN 5-325 MG PO TABS: 1.0000 | ORAL_TABLET | Freq: Four times a day (QID) | ORAL | Status: DC | PRN

## 2014-10-01 MED ORDER — PROPOFOL INFUSION 10 MG/ML OPTIME
INTRAVENOUS | Status: DC | PRN
  Administered 2014-10-01: 25 ug/kg/min via INTRAVENOUS

## 2014-10-01 MED ORDER — SODIUM CHLORIDE 0.9 % IV SOLN
INTRAVENOUS | Status: DC | PRN
  Administered 2014-10-01: 07:00:00 via INTRAVENOUS

## 2014-10-01 MED ORDER — FENTANYL CITRATE 0.05 MG/ML IJ SOLN
INTRAMUSCULAR | Status: AC
  Filled 2014-10-01: qty 5

## 2014-10-01 MED ORDER — SUCCINYLCHOLINE CHLORIDE 20 MG/ML IJ SOLN
INTRAMUSCULAR | Status: AC
  Filled 2014-10-01: qty 1

## 2014-10-01 MED ORDER — LIDOCAINE-EPINEPHRINE (PF) 1 %-1:200000 IJ SOLN
INTRAMUSCULAR | Status: DC | PRN
  Administered 2014-10-01: 30 mL

## 2014-10-01 MED ORDER — 0.9 % SODIUM CHLORIDE (POUR BTL) OPTIME
TOPICAL | Status: DC | PRN
  Administered 2014-10-01: 1000 mL

## 2014-10-01 SURGICAL SUPPLY — 39 items
ADH SKN CLS APL DERMABOND .7 (GAUZE/BANDAGES/DRESSINGS) ×1
CANISTER SUCTION 2500CC (MISCELLANEOUS) ×3 IMPLANT
CLIP TI MEDIUM 6 (CLIP) ×3 IMPLANT
CLIP TI WIDE RED SMALL 6 (CLIP) ×3 IMPLANT
COVER SURGICAL LIGHT HANDLE (MISCELLANEOUS) ×3 IMPLANT
DECANTER SPIKE VIAL GLASS SM (MISCELLANEOUS) ×3 IMPLANT
DERMABOND ADVANCED (GAUZE/BANDAGES/DRESSINGS) ×2
DERMABOND ADVANCED .7 DNX12 (GAUZE/BANDAGES/DRESSINGS) ×1 IMPLANT
ELECT REM PT RETURN 9FT ADLT (ELECTROSURGICAL) ×3
ELECTRODE REM PT RTRN 9FT ADLT (ELECTROSURGICAL) ×1 IMPLANT
GEL ULTRASOUND 20GR AQUASONIC (MISCELLANEOUS) IMPLANT
GLOVE BIO SURGEON STRL SZ 6.5 (GLOVE) ×2 IMPLANT
GLOVE BIO SURGEONS STRL SZ 6.5 (GLOVE) ×2
GLOVE BIOGEL PI IND STRL 6.5 (GLOVE) IMPLANT
GLOVE BIOGEL PI INDICATOR 6.5 (GLOVE) ×2
GLOVE SKINSENSE NS SZ7.0 (GLOVE) ×6
GLOVE SKINSENSE STRL SZ7.0 (GLOVE) IMPLANT
GLOVE SS BIOGEL STRL SZ 7 (GLOVE) ×1 IMPLANT
GLOVE SUPERSENSE BIOGEL SZ 7 (GLOVE) ×2
GOWN BRE IMP SLV AUR XL STRL (GOWN DISPOSABLE) ×6 IMPLANT
GOWN STRL REUS W/ TWL LRG LVL3 (GOWN DISPOSABLE) ×3 IMPLANT
GOWN STRL REUS W/TWL LRG LVL3 (GOWN DISPOSABLE) ×9
KIT BASIN OR (CUSTOM PROCEDURE TRAY) ×3 IMPLANT
KIT ROOM TURNOVER OR (KITS) ×3 IMPLANT
NDL HYPO 25GX1X1/2 BEV (NEEDLE) ×1 IMPLANT
NEEDLE HYPO 25GX1X1/2 BEV (NEEDLE) ×3 IMPLANT
NS IRRIG 1000ML POUR BTL (IV SOLUTION) ×3 IMPLANT
PACK CV ACCESS (CUSTOM PROCEDURE TRAY) ×3 IMPLANT
PAD ARMBOARD 7.5X6 YLW CONV (MISCELLANEOUS) ×6 IMPLANT
SPONGE GAUZE 4X4 12PLY STER LF (GAUZE/BANDAGES/DRESSINGS) ×2 IMPLANT
SUT ETHILON 3 0 PS 1 (SUTURE) ×2 IMPLANT
SUT PROLENE 6 0 BV (SUTURE) ×6 IMPLANT
SUT VIC AB 3-0 SH 27 (SUTURE) ×6
SUT VIC AB 3-0 SH 27X BRD (SUTURE) ×2 IMPLANT
TAPE CLOTH SURG 4X10 WHT LF (GAUZE/BANDAGES/DRESSINGS) ×2 IMPLANT
TOWEL OR 17X24 6PK STRL BLUE (TOWEL DISPOSABLE) ×3 IMPLANT
TOWEL OR 17X26 10 PK STRL BLUE (TOWEL DISPOSABLE) ×3 IMPLANT
UNDERPAD 30X30 INCONTINENT (UNDERPADS AND DIAPERS) ×3 IMPLANT
WATER STERILE IRR 1000ML POUR (IV SOLUTION) ×3 IMPLANT

## 2014-10-01 NOTE — Interval H&P Note (Signed)
History and Physical Interval Note:  10/01/2014 7:27 AM  Andrew [name]  has presented today for surgery, with the diagnosis of End Stage Renal Disease N18.6  The various methods of treatment have been discussed with the patient and family. After consideration of risks, benefits and other options for treatment, the patient has consented to  Procedure(s): REMOVE SEGMENT OF Left THIGH ARTERIOVENOUS GORETEX GRAFT (Left) as a surgical intervention .  The patient's history has been reviewed, patient examined, no change in status, stable for surgery.  I have reviewed the patient's chart and labs.  Questions were answered to the patient's satisfaction.     [name]

## 2014-10-01 NOTE — Op Note (Signed)
OPERATIVE REPORT  Date of Surgery: 10/01/2014  Surgeon: [name], MD  Assistant: Dorise Bullion  Pre-op Diagnosis: end-stage renal disease with infected segment of nonfunctional AV graft left thigh  Post-op Diagnosis:same  Procedure: Procedure(s): Removal of short segment of infected nonfunctional left thigh graft Anesthesia: Mac  EBL: Minimal  Complications: None  Procedure Details: Patient was taken to the operating room placed in supine position at which time the left thigh area was prepped with Betadine scrub and solution draped in a routine sterile manner. After infiltration with 1% Xylocaine with epinephrine longitudinal incision was made over the short infected segment this was at the 3:00 position on the nonfunctional portion of the thigh graft. A new graft had been told peripheral to this about one month earlier this was well-healed. Incision was carried down through the scan which had a few openings down to the graft. The graft was not frankly purulent but there was some slight drainage. Graft was densely adherent to the adjacent tissues. It was carved away with a 15 blade and the graft excised from this area. Following this the skin edges were excised slightly with a 15 blade back to more healthy-appearing tissue after thorough irrigation wound was closed with 4 interrupted 3-0 nylon sutures in vertical mattress fashion. Sterile dressing applied patient taken to recovery room in stable condition   [name], MD 10/01/2014 8:10 AM

## 2014-10-01 NOTE — H&P (View-Only) (Signed)
Subjective:     Patient ID: Andrew [name], male   DOB: 14-Jan-1926, 80 y.o.   MRN: [id number]  HPI this 80 year old male is evaluated for possible infected segment of left thigh AV graft. Patient had graft revision by Dr. Shanon Rosser 08/27/2014 with rerouting of the arterial half of the loop. There has been some drainage from the old nonfunctional. He denies chills and fever.  Past Medical History  Diagnosis Date  . ESRD on hemodialysis     Hemodialysis since 2003; Occluded access in both upper extremities and the right lower extremity, current access as of Aug 2014 is L thigh AVG. Gets HD MWF at [facility].     . Duodenal ulcer     remote  . Chronic combined systolic and diastolic CHF (congestive heart failure)     a. 02/2012 Echo: EF 65%, basal infolat AK, mild conc LVH;  b. 06/2013 Echo: EF 40-45%, mild LVH, Gr2 DD, basal inf HK->AK, Mod AS, Mild MR, PASP 73  . Arteriosclerotic cardiovascular disease (ASCVD) 1998    a. [id number]; b. 06/2003 Cath: LM 70d, LAD 70ost, 80p, LCX 70p/174m, RCA 80p/145m, LIMA->LAD patent, VG->OM2->OM3 patent, VG->dRCA patent, EF 55%;  c. 02/2012 Lexi MV: inf/inflat scarring w/ ? minimal peri-infarct ischemia.  . Cerebrovascular disease   . Neuropathy of foot   . Degenerative joint disease   . Gastroesophageal reflux disease   . Impaired hearing     Bilateral; hearing aids relatively ineffective  . Hypertension   . Cholelithiasis 03/2011    Diagnosed incidentally on abdominal ultrasound in 03/2011  . Hepatic steatosis   . Macrocytosis 10/20/2012  . Thrombocytopenia 10/20/2012  . Gallstones   . Hyperlipidemia   . Secondary hyperparathyroidism   . Peripheral vascular disease   . Gout   . Moderate aortic stenosis     a. 06/2013 Echo: Mod AS, mean grad: 25, peak grad: 57.  . Pulmonary hypertension     a. 06/2013 Echo: PASP 48mmHg.  Marland Kitchen Anemia     Prior blood transfusion  . History of blood transfusion     last infusion 07/31/13  . Complication of  anesthesia     daughter reports long episodes confusion after amnesia meds  . History of bladder cancer   . Renal cell carcinoma 2001    s/p right nephrectomy-2001; subsequent ESRD  . 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 Hypotension   . Pancytopenia     History  Substance Use Topics  . Smoking status: Former Smoker -- 1.00 packs/day for 25 years    Types: Cigarettes    Quit date: 02/26/1984  . Smokeless tobacco: Former Systems developer     Comment: quit 1985  . Alcohol Use: No    Family History  Problem Relation Age of Onset  . Colon cancer Neg Hx   . Liver disease Neg Hx   . Anesthesia problems Neg Hx   . Heart disease Father   . Other Mother     died @ 29 with respiratory issues following hip fx  . Diabetes Mother     No Known Allergies  Current outpatient prescriptions: acetaminophen (TYLENOL) 500 MG tablet, Take 500 mg by mouth every 6 (six) hours as needed. For pain/headache, Disp: , Rfl: ;  albuterol (PROVENTIL HFA;VENTOLIN HFA) 108 (90 BASE) MCG/ACT inhaler, Inhale 2 puffs into the lungs every 6 (six) hours as needed for wheezing or shortness of breath., Disp: 1 Inhaler, Rfl: 0;  allopurinol (ZYLOPRIM) 100 MG tablet, Take 200  mg by mouth at bedtime. , Disp: , Rfl:  aspirin EC 81 MG tablet, Take 81 mg by mouth at bedtime. , Disp: , Rfl: ;  atorvastatin (LIPITOR) 10 MG tablet, Take 10 mg by mouth every morning. , Disp: , Rfl: ;  B Complex-C-Folic Acid (DIALYVITE TABLET) TABS, Take 1 tablet by mouth at bedtime. , Disp: , Rfl: ;  calcium carbonate (TUMS - DOSED IN MG ELEMENTAL CALCIUM) 500 MG chewable tablet, Chew 1 tablet by mouth daily as needed for heartburn. For heartburn, Disp: , Rfl:  cinacalcet (SENSIPAR) 30 MG tablet, Take 30 mg by mouth daily with breakfast. , Disp: , Rfl: ;  colchicine (COLCRYS) 0.6 MG tablet, Take 0.6 mg by mouth 2 (two) times daily as needed. for gout, Disp: , Rfl: ;  docusate sodium (COLACE) 100 MG capsule, Take 100 mg by mouth daily.,  Disp: , Rfl: ;  gabapentin (NEURONTIN) 100 MG capsule, Take 100 mg by mouth 2 (two) times daily. Takes 1 in the morning and 2 at night, Disp: , Rfl:  midodrine (PROAMATINE) 10 MG tablet, Take 20 mg by mouth 3 (three) times daily. , Disp: , Rfl: ;  omeprazole (PRILOSEC) 40 MG capsule, Take 40 mg by mouth daily as needed. For reflux, Disp: , Rfl: ;  oxyCODONE-acetaminophen (PERCOCET/ROXICET) 5-325 MG per tablet, Take 1 tablet by mouth every 6 (six) hours as needed., Disp: 20 tablet, Rfl: 0;  polyethylene glycol powder (MIRALAX) powder, Take 17 g by mouth at bedtime. , Disp: , Rfl:  sevelamer (RENVELA) 800 MG tablet, Take 2,400 mg by mouth 3 (three) times daily with meals. 4 tabs 3 x daily and 2 with snack, Disp: , Rfl:  No current facility-administered medications for this visit. Facility-Administered Medications Ordered in Other Visits: methylPREDNISolone sodium succinate (SOLU-MEDROL) 125 mg/2 mL injection 60 mg, 60 mg, Intravenous, Once, Sol Blazing, MD  BP 167/79 mmHg  Pulse 88  Resp 18  Ht 5\' 8"  (1.727 m)  Wt 173 lb (78.472 kg)  BMI 26.31 kg/m2  Body mass index is 26.31 kg/(m^2).          Review of SystemsShe denies chest pain or dyspnea on exertion.     Objective:   Physical Exam BP 167/79 mmHg  Pulse 88  Resp 18  Ht 5\' 8"  (1.727 m)  Wt 173 lb (78.472 kg)  BMI 26.31 kg/m2  Gen.-alert and oriented x3 in no apparent distress HEENT normal for age Lungs no rhonchi or wheezing Cardiovascular regular rhythm no murmurs carotid pulses 3+ palpable no bruits audible Abdomen soft nontender no palpable masses Musculoskeletal free of  major deformities Skin clear -no rashes Neurologic normal Lower extremities 3+ femoral and dorsalis pedis pulses palpable bilaterally with no edema Left thigh examined with functional AV graft. There has been revision with new segment on the arterial half of the loop which is well-healed. The old nonfunctional segment has some erythema and  drainage.       Assessment:     Infection of localized segment of non-functional left thigh AV graft in patient with end-stage renal disease     Plan:     Plan removal of this infected segment on Thursday, November 5 under local anesthesia with sedation.

## 2014-10-01 NOTE — Telephone Encounter (Addendum)
-----   Message from Mena Goes, RN sent at 10/01/2014  9:28 AM EST ----- Regarding: Schedule   ----- Message -----    From: Alvia Grove, PA-C    Sent: 10/01/2014   8:15 AM      To: [facility] Charge Pool  S/p removal of old segment of left thigh graft 10/01/14  F/u with Vinnie Level next Thursday 10/08/14 for wound check. Patient will then need to follow up with Dr Kellie Simmering two weeks later if stable, or earlier as determined by Vinnie Level.   Thanks, Kim  10/01/14: spoke with patient to schedule appts, dpm

## 2014-10-01 NOTE — Anesthesia Preprocedure Evaluation (Signed)
Anesthesia Evaluation  Patient identified by MRN, date of birth, ID band  Reviewed: Allergy & Precautions, H&P , NPO status , Patient's Chart, lab work & pertinent test results  Airway Mallampati: II  TM Distance: >3 FB Neck ROM: Full    Dental   Pulmonary shortness of breath, former smoker,  breath sounds clear to auscultation        Cardiovascular hypertension, + Peripheral Vascular Disease and +CHF + Valvular Problems/Murmurs AS Rhythm:Regular Rate:Normal     Neuro/Psych    GI/Hepatic Neg liver ROS, PUD, GERD-  ,  Endo/Other    Renal/GU Renal disease     Musculoskeletal   Abdominal   Peds  Hematology   Anesthesia Other Findings   Reproductive/Obstetrics                             Anesthesia Physical Anesthesia Plan  ASA: IV  Anesthesia Plan: General   Post-op Pain Management:    Induction: Intravenous  Airway Management Planned: LMA  Additional Equipment:   Intra-op Plan:   Post-operative Plan: Extubation in OR  Informed Consent: I have reviewed the patients History and Physical, chart, labs and discussed the procedure including the risks, benefits and alternatives for the proposed anesthesia with the patient or authorized representative who has indicated his/her understanding and acceptance.   Dental advisory given  Plan Discussed with: CRNA and Anesthesiologist  Anesthesia Plan Comments:         Anesthesia Quick Evaluation

## 2014-10-01 NOTE — Transfer of Care (Signed)
Immediate Anesthesia Transfer of Care Note  Patient: Andrew [name]  Procedure(s) Performed: Procedure(s): SEGMENT OF Left THIGH ARTERIOVENOUS GORETEX GRAFT Removed (Left)  Patient Location: PACU  Anesthesia Type:MAC  Level of Consciousness: awake, alert  and oriented  Airway & Oxygen Therapy: Patient Spontanous Breathing and Patient connected to face mask oxygen  Post-op Assessment: Report given to PACU RN and Post -op Vital signs reviewed and stable  Post vital signs: Reviewed and stable  Complications: No apparent anesthesia complications

## 2014-10-01 NOTE — Anesthesia Postprocedure Evaluation (Signed)
  Anesthesia Post-op Note  Patient: Andrew [name]  Procedure(s) Performed: Procedure(s): SEGMENT OF Left THIGH ARTERIOVENOUS GORETEX GRAFT Removed (Left)  Patient Location: PACU  Anesthesia TypeMac  Level of Consciousness: awake  Airway and Oxygen Therapy: Patient Spontanous Breathing  Post-op Pain: mild  Post-op Assessment: Post-op Vital signs reviewed  Post-op Vital Signs: Reviewed  Last Vitals:  Filed Vitals:   10/01/14 0915  BP: 159/65  Pulse: 80  Temp:   Resp: 18    Complications: No apparent anesthesia complications

## 2014-10-02 ENCOUNTER — Encounter (HOSPITAL_COMMUNITY): Payer: Self-pay | Admitting: Vascular Surgery

## 2014-10-06 ENCOUNTER — Other Ambulatory Visit (HOSPITAL_BASED_OUTPATIENT_CLINIC_OR_DEPARTMENT_OTHER): Payer: Medicare Other

## 2014-10-06 ENCOUNTER — Ambulatory Visit (HOSPITAL_BASED_OUTPATIENT_CLINIC_OR_DEPARTMENT_OTHER): Payer: Medicare Other

## 2014-10-06 ENCOUNTER — Other Ambulatory Visit: Payer: Medicare Other

## 2014-10-06 DIAGNOSIS — T451X5A Adverse effect of antineoplastic and immunosuppressive drugs, initial encounter: Principal | ICD-10-CM

## 2014-10-06 DIAGNOSIS — D46C Myelodysplastic syndrome with isolated del(5q) chromosomal abnormality: Secondary | ICD-10-CM

## 2014-10-06 DIAGNOSIS — D72819 Decreased white blood cell count, unspecified: Secondary | ICD-10-CM

## 2014-10-06 DIAGNOSIS — D649 Anemia, unspecified: Secondary | ICD-10-CM

## 2014-10-06 DIAGNOSIS — D701 Agranulocytosis secondary to cancer chemotherapy: Secondary | ICD-10-CM

## 2014-10-06 LAB — CBC WITH DIFFERENTIAL/PLATELET
BASO%: 1.2 % (ref 0.0–2.0)
BASOS ABS: 0 10*3/uL (ref 0.0–0.1)
EOS%: 2 % (ref 0.0–7.0)
Eosinophils Absolute: 0 10*3/uL (ref 0.0–0.5)
HCT: 28.9 % — ABNORMAL LOW (ref 38.4–49.9)
HEMOGLOBIN: 9.1 g/dL — AB (ref 13.0–17.1)
LYMPH#: 0.5 10*3/uL — AB (ref 0.9–3.3)
LYMPH%: 23.9 % (ref 14.0–49.0)
MCH: 35.1 pg — ABNORMAL HIGH (ref 27.2–33.4)
MCHC: 31.5 g/dL — AB (ref 32.0–36.0)
MCV: 111.5 fL — ABNORMAL HIGH (ref 79.3–98.0)
MONO#: 0.3 10*3/uL (ref 0.1–0.9)
MONO%: 12.1 % (ref 0.0–14.0)
NEUT#: 1.4 10*3/uL — ABNORMAL LOW (ref 1.5–6.5)
NEUT%: 60.8 % (ref 39.0–75.0)
Platelets: 62 10*3/uL — ABNORMAL LOW (ref 140–400)
RBC: 2.59 10*6/uL — AB (ref 4.20–5.82)
RDW: 16.6 % — AB (ref 11.0–14.6)
WBC: 2.2 10*3/uL — ABNORMAL LOW (ref 4.0–10.3)

## 2014-10-06 MED ORDER — TBO-FILGRASTIM 480 MCG/0.8ML ~~LOC~~ SOSY
480.0000 ug | PREFILLED_SYRINGE | Freq: Once | SUBCUTANEOUS | Status: AC
  Administered 2014-10-06: 480 ug via SUBCUTANEOUS
  Filled 2014-10-06: qty 0.8

## 2014-10-06 NOTE — Progress Notes (Signed)
Pt states he fell this morning (pt lives alone), states he was on the floor for a couple hours and then his caregiver/ride came and helped him up, pt refused ED at that time. States he is a little sore but didn't feel like he needed to be brought to ED. Pt states he just slipped while trying to get out of bed this morning. Denied any dizziness, blurred vision or any other concerns. Pt BP 98/44, states he has low BP and didn't take his medication this morning, pt chart indicates pt has HTN, pt denies by hydrated this morning d/t his fall. Informed pt to drink plenty of fluids and recheck his BP in a couple hrs, informed pt if he felt any worse/ or felt necessary to go to the ED. Pt verbalized understanding.

## 2014-10-06 NOTE — Patient Instructions (Signed)

## 2014-10-07 ENCOUNTER — Encounter: Payer: Self-pay | Admitting: Family

## 2014-10-07 ENCOUNTER — Telehealth: Payer: Self-pay | Admitting: *Deleted

## 2014-10-07 NOTE — Telephone Encounter (Signed)
Yes, one dose and no more until next recheck

## 2014-10-07 NOTE — Telephone Encounter (Signed)
Instructed daughter to take one dose Revlimid and keep lab appt next week as scheduled.  She verbalized understanding.

## 2014-10-07 NOTE — Telephone Encounter (Signed)
Daughter asks if pt should resume his Revlimid?  He had labs done yesterday.

## 2014-10-08 ENCOUNTER — Ambulatory Visit (INDEPENDENT_AMBULATORY_CARE_PROVIDER_SITE_OTHER): Payer: Medicare Other | Admitting: Family

## 2014-10-08 ENCOUNTER — Encounter: Payer: Self-pay | Admitting: Family

## 2014-10-08 VITALS — BP 90/54 | HR 14 | Resp 16 | Ht 68.0 in | Wt 170.0 lb

## 2014-10-08 DIAGNOSIS — Z992 Dependence on renal dialysis: Secondary | ICD-10-CM

## 2014-10-08 NOTE — Progress Notes (Signed)
    Postoperative Access Visit   History of Present Illness  Andrew [name] is a 80 y.o. year old male patient of Dr. Kellie Simmering who returns today for wound check s/p removal of infected portion of left thigh AV graft on 10/01/14. Patient had graft revision by Dr. Shanon Rosser on 08/27/2014 with rerouting of the arterial half of the loop. The patient's wounds are well healed, he denies fever or chills.   The patient is able to complete their activities of daily living.  The patient's current symptoms are: he has felt weak for the last few weeks, since he slid off the bed. He dialyzes M-W-F, HD center is using his left thigh access for HD. He has been on dialysis for 14 years.  For VQI Use Only  PRE-ADM LIVING: Home  AMB STATUS: Ambulatory with walker  Physical Examination Filed Vitals:   10/08/14 1003  BP: 90/54  Pulse: 14  Resp: 16  Height: 5\' 8"  (1.727 m)  Weight: 170 lb (77.111 kg)  SpO2: 100%   Body mass index is 25.85 kg/(m^2).  Left thigh: Incision is well healed, no erythema, no swelling, palpable thrill, sensation is intact in lower legs.  Medical Decision Making  Andrew [name] is a 80 y.o. year old male who presents s/p removal of infected portion of left thigh AV graft on 10/01/14.  Based on today's exam and HPI, and after discussing with Dr. Oneida Alar, pt will return as scheduled on 10/20/14 to be evaluated by Dr. Kellie Simmering.  He was advised to call sooner should he have concerns or problems.   Yulitza Shorts, Sharmon Leyden, RN, MSN, FNP-C [facility] Office: [phone]  10/08/2014, 10:08 AM  Clinic MD: Oneida Alar

## 2014-10-08 NOTE — Patient Instructions (Signed)
Vascular Access for Hemodialysis A vascular access is a connection between two blood vessels that allows blood to be easily removed from the body and returned to the body during hemodialysis. Hemodialysis is a procedure in which a machine outside of the body filters the blood. There are three types of vascular accesses:   Arteriovenous fistula. This is a connection between an artery and a vein (usually in the arm) that is made by sewing them together. Blood in the artery flows directly into the vein, causing it to get larger over time. This makes it easier for the vein to be used for hemodialysis. An arteriovenous fistula takes 1-6 months to develop after surgery.   Arteriovenous graft. This is a connection between an artery and a vein in the arm that is made with a tube. An arteriovenous graft can be used within 2-3 weeks of surgery.   Venous catheter. This is a thin, flexible tube that is placed in a large vein (usually in the neck, chest, or groin). A venous catheter for hemodialysis contains two tubes that come out of the skin. A venous catheter can be used right away. It is usually used as a temporary access if you need hemodialysis before a fistula or graft has developed. It may also be used as a permanent access if a fistula or graft cannot be created. WHICH TYPE OF ACCESS IS BEST FOR ME? The type of access that is best for you depends on the size and strength of your veins.  A fistula is usually the preferred type of access. It can last several years and is less likely than the other types of accesses to become infected or to cause blood clots within a blood vessel (thrombosis). However, a fistula is not an option for everyone. If your veins are not the right size, a graft may be used instead. Grafts require you to have strong veins. If your veins are not strong enough for a graft, a catheter may be used. Catheters are more likely than fistulas and grafts to become infected or to have thrombosis.   Sometimes, only one type of access is an option. Your health care provider will help you determine which type of access is best for you.  HOW IS A VASCULAR ACCESS USED? The way the access is used depends on the type of access:   If the access is a fistula or graft, two needles are inserted through the skin into the access before each hemodialysis session. Blood leaves the body through one of the needles and travels through a tube to the hemodialysis machine (dialyzer). It then flows through another tube and returns to the body through the second needle.   If the access is a catheter, one tube is connected directly to the tube that leads to the dialyzer and the other is connected to a tube that leads away from the dialyzer. Blood leaves the body through one tube and returns to the body through the other.  WHAT KIND OF PROBLEMS CAN OCCUR WITH VASCULAR ACCESSES?  Blood clots within a blood vessel (thrombosis). Thrombosis can lead to a narrowing of a blood vessel or tube (stenosis). If thrombosis occurs frequently, another access site may be created as a backup.   Infection.  These problems are most likely to occur with a venous catheter and least likely to occur with an arteriovenous fistula.  HOW DO I CARE FOR MY VASCULAR ACCESS? Wear a medical alert bracelet. This tells health care providers that you are   a dialysis patient in the case of an emergency and allows them to care for your veins appropriately. If you have a graft or fistula:   A "bruit" is a noise that is heard with a stethoscope and a "thrill" is a vibration felt over the graft or fistula. The presence of the bruit and thrill indicates that the access is working. You will be taught to feel for the thrill each day. If this is not felt, the access may be clotted. Call your health care provider.   You may use the arm where your vascular access is located freely after the site heals. Keep the following in mind:   Avoid pressure on  the arm.   Avoid lifting heavy objects with the arm.   Avoid sleeping on the arm.   Avoid wearing tight-sleeved shirts or jewelry around the graft or fistula.   Do not allow blood pressure monitoring or needle punctures on the side where the graft or fistula is located.   With permission from your health care provider, you may do exercises to help with blood flow through a fistula. These exercises involve squeezing a rubber ball or other soft objects as instructed. SEEK MEDICAL CARE IF:   Chills develop.   You have an oral temperature above 102 F (38.9 C).  Swelling around the graft or fistula gets worse.   New pain develops.   Pus or other fluid (drainage) is seen at the vascular access site.   Skin redness or red streaking is seen on the skin around, above, or below the vascular access. SEEK IMMEDIATE MEDICAL CARE IF:   Pain, numbness, or an unusual pale skin color develops in the hand on the side of your fistula.   Dizziness or weakness develops that you have not had before.   The vascular access has bleeding that cannot be easily controlled. Document Released: 02/03/2003 Document Revised: 03/30/2014 Document Reviewed: 04/01/2013 [employer] Patient Information 2015 [employer]. This information is not intended to replace advice given to you by your health care provider. Make sure you discuss any questions you have with your health care provider.  

## 2014-10-13 ENCOUNTER — Ambulatory Visit (HOSPITAL_BASED_OUTPATIENT_CLINIC_OR_DEPARTMENT_OTHER): Payer: Medicare Other

## 2014-10-13 ENCOUNTER — Other Ambulatory Visit (HOSPITAL_BASED_OUTPATIENT_CLINIC_OR_DEPARTMENT_OTHER): Payer: Medicare Other

## 2014-10-13 DIAGNOSIS — T451X5A Adverse effect of antineoplastic and immunosuppressive drugs, initial encounter: Principal | ICD-10-CM

## 2014-10-13 DIAGNOSIS — D701 Agranulocytosis secondary to cancer chemotherapy: Secondary | ICD-10-CM

## 2014-10-13 DIAGNOSIS — D46C Myelodysplastic syndrome with isolated del(5q) chromosomal abnormality: Secondary | ICD-10-CM

## 2014-10-13 DIAGNOSIS — D649 Anemia, unspecified: Secondary | ICD-10-CM

## 2014-10-13 LAB — CBC WITH DIFFERENTIAL/PLATELET
BASO%: 0 % (ref 0.0–2.0)
Basophils Absolute: 0 10*3/uL (ref 0.0–0.1)
EOS ABS: 0.2 10*3/uL (ref 0.0–0.5)
EOS%: 9.8 % — AB (ref 0.0–7.0)
HCT: 26 % — ABNORMAL LOW (ref 38.4–49.9)
HEMOGLOBIN: 8.3 g/dL — AB (ref 13.0–17.1)
LYMPH#: 0.6 10*3/uL — AB (ref 0.9–3.3)
LYMPH%: 41.8 % (ref 14.0–49.0)
MCH: 35.8 pg — ABNORMAL HIGH (ref 27.2–33.4)
MCHC: 31.9 g/dL — ABNORMAL LOW (ref 32.0–36.0)
MCV: 112.1 fL — ABNORMAL HIGH (ref 79.3–98.0)
MONO#: 0.2 10*3/uL (ref 0.1–0.9)
MONO%: 12.4 % (ref 0.0–14.0)
NEUT%: 36 % — ABNORMAL LOW (ref 39.0–75.0)
NEUTROS ABS: 0.6 10*3/uL — AB (ref 1.5–6.5)
Platelets: 66 10*3/uL — ABNORMAL LOW (ref 140–400)
RBC: 2.32 10*6/uL — ABNORMAL LOW (ref 4.20–5.82)
RDW: 16.3 % — AB (ref 11.0–14.6)
WBC: 1.5 10*3/uL — ABNORMAL LOW (ref 4.0–10.3)

## 2014-10-13 MED ORDER — TBO-FILGRASTIM 480 MCG/0.8ML ~~LOC~~ SOSY
480.0000 ug | PREFILLED_SYRINGE | Freq: Once | SUBCUTANEOUS | Status: AC
  Administered 2014-10-13: 480 ug via SUBCUTANEOUS
  Filled 2014-10-13: qty 0.8

## 2014-10-13 NOTE — Patient Instructions (Signed)

## 2014-10-19 ENCOUNTER — Encounter: Payer: Self-pay | Admitting: Vascular Surgery

## 2014-10-20 ENCOUNTER — Ambulatory Visit (HOSPITAL_BASED_OUTPATIENT_CLINIC_OR_DEPARTMENT_OTHER): Payer: Medicare Other

## 2014-10-20 ENCOUNTER — Other Ambulatory Visit (HOSPITAL_BASED_OUTPATIENT_CLINIC_OR_DEPARTMENT_OTHER): Payer: Medicare Other

## 2014-10-20 ENCOUNTER — Ambulatory Visit (HOSPITAL_BASED_OUTPATIENT_CLINIC_OR_DEPARTMENT_OTHER): Payer: Medicare Other | Admitting: Hematology and Oncology

## 2014-10-20 ENCOUNTER — Encounter: Payer: Self-pay | Admitting: Vascular Surgery

## 2014-10-20 ENCOUNTER — Telehealth: Payer: Self-pay | Admitting: Hematology and Oncology

## 2014-10-20 ENCOUNTER — Ambulatory Visit (INDEPENDENT_AMBULATORY_CARE_PROVIDER_SITE_OTHER): Payer: Self-pay | Admitting: Vascular Surgery

## 2014-10-20 ENCOUNTER — Encounter: Payer: Self-pay | Admitting: Hematology and Oncology

## 2014-10-20 VITALS — BP 199/81 | HR 88 | Ht 68.0 in | Wt 176.0 lb

## 2014-10-20 VITALS — BP 148/61 | HR 81 | Temp 97.5°F | Resp 17 | Ht 68.0 in | Wt 176.7 lb

## 2014-10-20 DIAGNOSIS — D46C Myelodysplastic syndrome with isolated del(5q) chromosomal abnormality: Secondary | ICD-10-CM

## 2014-10-20 DIAGNOSIS — D701 Agranulocytosis secondary to cancer chemotherapy: Secondary | ICD-10-CM

## 2014-10-20 DIAGNOSIS — N186 End stage renal disease: Secondary | ICD-10-CM

## 2014-10-20 DIAGNOSIS — D72819 Decreased white blood cell count, unspecified: Secondary | ICD-10-CM

## 2014-10-20 DIAGNOSIS — T451X5A Adverse effect of antineoplastic and immunosuppressive drugs, initial encounter: Secondary | ICD-10-CM

## 2014-10-20 DIAGNOSIS — D630 Anemia in neoplastic disease: Secondary | ICD-10-CM

## 2014-10-20 LAB — CBC WITH DIFFERENTIAL/PLATELET
BASO%: 1.7 % (ref 0.0–2.0)
Basophils Absolute: 0 10*3/uL (ref 0.0–0.1)
EOS ABS: 0.1 10*3/uL (ref 0.0–0.5)
EOS%: 7.5 % — ABNORMAL HIGH (ref 0.0–7.0)
HCT: 25.8 % — ABNORMAL LOW (ref 38.4–49.9)
HEMOGLOBIN: 8.2 g/dL — AB (ref 13.0–17.1)
LYMPH%: 50.8 % — ABNORMAL HIGH (ref 14.0–49.0)
MCH: 35.9 pg — ABNORMAL HIGH (ref 27.2–33.4)
MCHC: 31.8 g/dL — ABNORMAL LOW (ref 32.0–36.0)
MCV: 112.9 fL — ABNORMAL HIGH (ref 79.3–98.0)
MONO#: 0.3 10*3/uL (ref 0.1–0.9)
MONO%: 15.8 % — ABNORMAL HIGH (ref 0.0–14.0)
NEUT%: 24.2 % — ABNORMAL LOW (ref 39.0–75.0)
NEUTROS ABS: 0.4 10*3/uL — AB (ref 1.5–6.5)
Platelets: 82 10*3/uL — ABNORMAL LOW (ref 140–400)
RBC: 2.28 10*6/uL — AB (ref 4.20–5.82)
RDW: 17.4 % — AB (ref 11.0–14.6)
WBC: 1.7 10*3/uL — ABNORMAL LOW (ref 4.0–10.3)
lymph#: 0.8 10*3/uL — ABNORMAL LOW (ref 0.9–3.3)

## 2014-10-20 MED ORDER — TBO-FILGRASTIM 480 MCG/0.8ML ~~LOC~~ SOSY
480.0000 ug | PREFILLED_SYRINGE | Freq: Once | SUBCUTANEOUS | Status: AC
  Administered 2014-10-20: 480 ug via SUBCUTANEOUS
  Filled 2014-10-20: qty 0.8

## 2014-10-20 NOTE — Assessment & Plan Note (Signed)
This is likely anemia of chronic disease. The patient denies recent history of bleeding such as epistaxis, hematuria or hematochezia. He is asymptomatic from the anemia. We will observe for now.  He does not require transfusion now.  We discussed establishing transfusion threshold to give him 1 unit of blood each time if his hemoglobin dropped to less than 8 g. He responded well 2 months ago to the addition of Granix. Due to recent MRSA infection, his blood count has dropped since. He will continue to hold Revlimid

## 2014-10-20 NOTE — Patient Instructions (Signed)

## 2014-10-20 NOTE — Progress Notes (Signed)
Subjective:     Patient ID: Andrew [name], male   DOB: March 09, 1926, 79 y.o.   MRN: [id number]  HPI this 80 year old male returns for continued follow-up regarding the recent surgery on November 5 for removal of one short segment of exposed graft in the left thigh. There had been a rerouting procedure done prior to that. Short segment of the graft was removed. There has been some drainage from the area but the family states that it is looking better. He has had no chills and fever. He is being dialyzed through the medial half of the loop. Review of Systems     Objective:   Physical Exam BP 199/81 mmHg  Pulse 88  Ht 5\' 8"  (1.727 m)  Wt 176 lb (79.833 kg)  BMI 26.77 kg/m2  SpO2 100%  Left thigh graft examined. Good pulse and palpable thrill. The short incision where the surgical site was located is healing nicely other than one small area at the proximal portion but there is no purulent drainage. There is some granulation tissue. This was expressed with no pus being expressed. The sutures were removed      Assessment:     Healing incision where short segment of infected or exposed graft was removed which was nonfunctional    Plan:     Continue daily cleansing and dressing of the wound by the family and continue hemodialysis. Remainder of the graft can be utilized when dialysis feels comfortable with this Return to see me on when necessary basis Family understands that it is possible that the remainder of the nonfunctional portion of the thigh graft could become infected but it does not appear to be the case at the present time

## 2014-10-20 NOTE — Progress Notes (Signed)
[facility] OFFICE PROGRESS NOTE  Patient Care Team: Redmond School, MD as PCP - General (Internal Medicine) Daneil Dolin, MD (Gastroenterology) Geoffry Paradise, MD (Nephrology) Estanislado Emms, MD as Consulting Physician (Nephrology) Baird Cancer, PA-C as Physician Assistant (Physician Assistant) Heath Lark, MD as Consulting Physician (Hematology and Oncology)  SUMMARY OF ONCOLOGIC HISTORY:  He has multiple advanced medical problems including underlying coronary disease, end-stage renal disease on dialysis, multifactorial anemia related to renal disease and an underlying myelodysplastic syndrome  During a July 2014 hospitalization, he was transfusion dependent for blood despite Aranesp support. Hematologic profile suggested 5Q minus syndrome. He agreed to a bone marrow biopsy on August 5. Findings were consistent with a myelodysplastic syndrome but the 5Q minus deletion was not seen on FISH studies. He was started on a trial of Revlimid at a low dose of 5 mg 3 times weekly after each dialysis procedure. He was only on the drug for about 2 weeks when he was readmitted to the hospital on September 11 with profound pancytopenia: Hemoglobin 7 g, white count 1000, platelets 33,000. Revlimid was stopped. His blood counts have steadily improved. He has not required any additional transfusions since the September hospitalization and his hemoglobin has steadily come up to a peak  value of 13 g recorded on 11/25/2013.Marland Kitchen His white blood count has normalized. His platelet count has recovered to over 100,000. He continue on dialysis 3 times a week on Mondays, Wednesdays and Fridays. On 04/28/2014, repeat bone marrow aspirate and biopsy confirmed persistent MDS. On 05/08/2014, he was started on Revlimid 5 mg weekly. On 05/16/6014, Revlimid was placed on hold due to severe pancytopenia On 07/03/14: Revlimid was restarted at 2.5 mg weekly PO On 07/14/14: Revlimid was placed on hold again due to  severe pancytopenia On 07/28/14: He is started on weekly GCSF  INTERVAL HISTORY: Please see below for problem oriented charting. He had recent infection with MRSA and is receiving IV antibiotics through his nephrologist office. He is getting better. The patient denies any recent signs or symptoms of bleeding such as spontaneous epistaxis, hematuria or hematochezia.  REVIEW OF SYSTEMS:   Constitutional: Denies fevers, chills or abnormal weight loss Eyes: Denies blurriness of vision Ears, nose, mouth, throat, and face: Denies mucositis or sore throat Respiratory: Denies cough, dyspnea or wheezes Cardiovascular: Denies palpitation, chest discomfort or lower extremity swelling Gastrointestinal:  Denies nausea, heartburn or change in bowel habits Skin: Denies abnormal skin rashes Lymphatics: Denies new lymphadenopathy or easy bruising Neurological:Denies numbness, tingling or new weaknesses Behavioral/Psych: Mood is stable, no new changes  All other systems were reviewed with the patient and are negative.  I have reviewed the past medical history, past surgical history, social history and family history with the patient and they are unchanged from previous note.  ALLERGIES:  has No Known Allergies.  MEDICATIONS:  Current Outpatient Prescriptions  Medication Sig Dispense Refill  . acetaminophen (TYLENOL) 500 MG tablet Take 500 mg by mouth every 6 (six) hours as needed. For pain/headache    . albuterol (PROVENTIL HFA;VENTOLIN HFA) 108 (90 BASE) MCG/ACT inhaler Inhale 2 puffs into the lungs every 6 (six) hours as needed for wheezing or shortness of breath. 1 Inhaler 0  . allopurinol (ZYLOPRIM) 100 MG tablet Take 100 mg by mouth 2 (two) times daily.     Marland Kitchen aspirin EC 81 MG tablet Take 81 mg by mouth at bedtime.     Marland Kitchen atorvastatin (LIPITOR) 10 MG tablet Take 10 mg  by mouth every morning.     . B Complex-C-Folic Acid (DIALYVITE TABLET) TABS Take 1 tablet by mouth at bedtime.     . calcium  carbonate (TUMS - DOSED IN MG ELEMENTAL CALCIUM) 500 MG chewable tablet Chew 1 tablet by mouth daily as needed for heartburn. For heartburn    . cinacalcet (SENSIPAR) 30 MG tablet Take 30 mg by mouth daily with breakfast.     . colchicine 0.6 MG tablet Take 0.6 mg by mouth 2 (two) times daily as needed (for gout).    Marland Kitchen docusate sodium (COLACE) 100 MG capsule Take 100 mg by mouth daily as needed for mild constipation.     . gabapentin (NEURONTIN) 100 MG capsule Take 100-200 mg by mouth 2 (two) times daily. Takes 1 in the morning and 2 at night    . midodrine (PROAMATINE) 10 MG tablet Take 10-20 mg by mouth See admin instructions. On Dialysis days (M,W,F), patient takes 2 tabs in the morning and 1 in the evening and on the other days patient takes 1 tablet in the morning and 2 in teh evening    . omeprazole (PRILOSEC) 40 MG capsule Take 40 mg by mouth daily as needed. For reflux    . oxyCODONE-acetaminophen (PERCOCET/ROXICET) 5-325 MG per tablet Take 1 tablet by mouth every 6 (six) hours as needed. 20 tablet 0  . polyethylene glycol powder (MIRALAX) powder Take 17 g by mouth at bedtime.     . sevelamer (RENVELA) 800 MG tablet Take 1,600-2,400 mg by mouth See admin instructions. Take 3 tabs with each meals and 2 tabs with snacks     No current facility-administered medications for this visit.   Facility-Administered Medications Ordered in Other Visits  Medication Dose Route Frequency Provider Last Rate Last Dose  . methylPREDNISolone sodium succinate (SOLU-MEDROL) 125 mg/2 mL injection 60 mg  60 mg Intravenous Once Sol Blazing, MD        PHYSICAL EXAMINATION: ECOG PERFORMANCE STATUS: 1 - Symptomatic but completely ambulatory  Filed Vitals:   10/20/14 1105  BP: 148/61  Pulse: 81  Temp: 97.5 F (36.4 C)  Resp: 17   Filed Weights   10/20/14 1105  Weight: 176 lb 11.2 oz (80.151 kg)    GENERAL:alert, no distress and comfortable SKIN: skin color, texture, turgor are normal, no rashes  or significant lesions EYES: normal, Conjunctiva are pale and non-injected, sclera clear OROPHARYNX:no exudate, no erythema and lips, buccal mucosa, and tongue normal  NEURO: alert & oriented x 3 with fluent speech, no focal motor/sensory deficits  LABORATORY DATA:  I have reviewed the data as listed    Component Value Date/Time   NA 140 10/01/2014 0650   NA 142 08/26/2013 1119   K 4.9 10/01/2014 0650   K 4.1 08/26/2013 1119   K 6.0 03/23/2011 0912   CL 95* 08/14/2013 0640   CO2 33* 08/26/2013 1119   CO2 31 08/14/2013 0640   GLUCOSE 87 10/01/2014 0650   GLUCOSE 104 08/26/2013 1119   BUN 26.8* 08/26/2013 1119   BUN 21 08/14/2013 0640   CREATININE 5.3* 08/26/2013 1119   CREATININE 3.00* 08/14/2013 0640   CALCIUM 8.1* 08/26/2013 1119   CALCIUM 8.0* 08/14/2013 0640   CALCIUM 7.8* 03/13/2012 0527   PROT 6.4 08/26/2013 1119   PROT 5.5* 06/27/2013 0540   ALBUMIN 3.1* 08/26/2013 1119   ALBUMIN 2.5* 07/02/2013 0646   AST 29 08/26/2013 1119   AST 31 06/27/2013 0540   ALT 23 08/26/2013 1119  ALT 18 06/27/2013 0540   ALKPHOS 130 08/26/2013 1119   ALKPHOS 97 06/27/2013 0540   ALKPHOS 103 03/23/2011 0912   BILITOT 0.63 08/26/2013 1119   BILITOT 0.9 06/27/2013 0540   GFRNONAA 18* 08/14/2013 0640   GFRAA 20* 08/14/2013 0640    No results found for: SPEP, UPEP  Lab Results  Component Value Date   WBC 1.7* 10/20/2014   NEUTROABS 0.4* 10/20/2014   HGB 8.2* 10/20/2014   HCT 25.8* 10/20/2014   MCV 112.9* 10/20/2014   PLT 82* 10/20/2014      Chemistry      Component Value Date/Time   NA 140 10/01/2014 0650   NA 142 08/26/2013 1119   K 4.9 10/01/2014 0650   K 4.1 08/26/2013 1119   K 6.0 03/23/2011 0912   CL 95* 08/14/2013 0640   CO2 33* 08/26/2013 1119   CO2 31 08/14/2013 0640   BUN 26.8* 08/26/2013 1119   BUN 21 08/14/2013 0640   CREATININE 5.3* 08/26/2013 1119   CREATININE 3.00* 08/14/2013 0640      Component Value Date/Time   CALCIUM 8.1* 08/26/2013 1119    CALCIUM 8.0* 08/14/2013 0640   CALCIUM 7.8* 03/13/2012 0527   ALKPHOS 130 08/26/2013 1119   ALKPHOS 97 06/27/2013 0540   ALKPHOS 103 03/23/2011 0912   AST 29 08/26/2013 1119   AST 31 06/27/2013 0540   ALT 23 08/26/2013 1119   ALT 18 06/27/2013 0540   BILITOT 0.63 08/26/2013 1119   BILITOT 0.9 06/27/2013 0540     ASSESSMENT & PLAN:  Anemia in neoplastic disease This is likely anemia of chronic disease. The patient denies recent history of bleeding such as epistaxis, hematuria or hematochezia. He is asymptomatic from the anemia. We will observe for now.  He does not require transfusion now.  We discussed establishing transfusion threshold to give him 1 unit of blood each time if his hemoglobin dropped to less than 8 g. He responded well 2 months ago to the addition of Granix. Due to recent MRSA infection, his blood count has dropped since. He will continue to hold Revlimid       End stage renal disease He will continue dialysis on Mondays, Wednesdays and Fridays. He is getting ESA through his nephrologist office   Leukopenia due to antineoplastic chemotherapy This is likely due to recent treatment. I will observe for now.  We will continue to hold chemotherapy until [place] greater than 1000. The risk, benefits, side effects of G-CSF was discussed extensively with her daughter.    MDS (myelodysplastic syndrome) with 5q deletion Previously, he had excellent response to Revlimid however it was discontinued due to profound pancytopenia and significant toxicity. I started him on only Revlimid 5 mg once a week but his treatment has to be put on hold due to severe pancytopenia. Subsequently, the dose of Revlimid was reduced at 2.5 mg once a week and he only got 2 doses before he got significantly pancytopenic again. The patient will return here on a weekly basis to get his blood work be checked. Once his [place] is greater than 1000, we will resume Revlimid at 2.5 mg once a week on Fridays  after his dialysis.   No orders of the defined types were placed in this encounter.   All questions were answered. The patient knows to call the clinic with any problems, questions or concerns. No barriers to learning was detected. I spent 25 minutes counseling the patient face to face. The total time spent in  the appointment was 30 minutes and more than 50% was on counseling and review of test results     [facility] [place], [place], MD 10/20/2014 9:24 PM

## 2014-10-20 NOTE — Assessment & Plan Note (Signed)
Previously, he had excellent response to Revlimid however it was discontinued due to profound pancytopenia and significant toxicity. I started him on only Revlimid 5 mg once a week but his treatment has to be put on hold due to severe pancytopenia. Subsequently, the dose of Revlimid was reduced at 2.5 mg once a week and he only got 2 doses before he got significantly pancytopenic again. The patient will return here on a weekly basis to get his blood work be checked. Once his [place] is greater than 1000, we will resume Revlimid at 2.5 mg once a week on Fridays after his dialysis.

## 2014-10-20 NOTE — Assessment & Plan Note (Signed)
He will continue dialysis on Mondays, Wednesdays and Fridays. He is getting ESA through his nephrologist office

## 2014-10-20 NOTE — Telephone Encounter (Signed)
Gave avs & cal for Dec thru Feb 2016.

## 2014-10-20 NOTE — Assessment & Plan Note (Signed)
This is likely due to recent treatment. I will observe for now.  We will continue to hold chemotherapy until [place] greater than 1000. The risk, benefits, side effects of G-CSF was discussed extensively with her daughter.

## 2014-10-23 ENCOUNTER — Other Ambulatory Visit: Payer: Self-pay

## 2014-10-23 ENCOUNTER — Inpatient Hospital Stay (HOSPITAL_COMMUNITY)
Admission: EM | Admit: 2014-10-23 | Discharge: 2014-11-04 | DRG: 286 | Disposition: A | Discharge status: Skilled Nursing Facility | Payer: Medicare Other | Attending: Internal Medicine | Admitting: Internal Medicine

## 2014-10-23 ENCOUNTER — Emergency Department (HOSPITAL_COMMUNITY): Payer: Medicare Other

## 2014-10-23 ENCOUNTER — Encounter (HOSPITAL_COMMUNITY): Payer: Self-pay | Admitting: *Deleted

## 2014-10-23 DIAGNOSIS — D61818 Other pancytopenia: Secondary | ICD-10-CM | POA: Diagnosis not present

## 2014-10-23 DIAGNOSIS — D469 Myelodysplastic syndrome, unspecified: Secondary | ICD-10-CM | POA: Diagnosis present

## 2014-10-23 DIAGNOSIS — I350 Nonrheumatic aortic (valve) stenosis: Secondary | ICD-10-CM | POA: Diagnosis present

## 2014-10-23 DIAGNOSIS — I5042 Chronic combined systolic (congestive) and diastolic (congestive) heart failure: Secondary | ICD-10-CM | POA: Diagnosis present

## 2014-10-23 DIAGNOSIS — I272 Other secondary pulmonary hypertension: Secondary | ICD-10-CM | POA: Diagnosis present

## 2014-10-23 DIAGNOSIS — G629 Polyneuropathy, unspecified: Secondary | ICD-10-CM | POA: Diagnosis present

## 2014-10-23 DIAGNOSIS — K219 Gastro-esophageal reflux disease without esophagitis: Secondary | ICD-10-CM | POA: Diagnosis present

## 2014-10-23 DIAGNOSIS — L0390 Cellulitis, unspecified: Secondary | ICD-10-CM

## 2014-10-23 DIAGNOSIS — H9190 Unspecified hearing loss, unspecified ear: Secondary | ICD-10-CM | POA: Diagnosis present

## 2014-10-23 DIAGNOSIS — I25110 Atherosclerotic heart disease of native coronary artery with unstable angina pectoris: Principal | ICD-10-CM | POA: Diagnosis present

## 2014-10-23 DIAGNOSIS — I200 Unstable angina: Secondary | ICD-10-CM | POA: Diagnosis present

## 2014-10-23 DIAGNOSIS — I120 Hypertensive chronic kidney disease with stage 5 chronic kidney disease or end stage renal disease: Secondary | ICD-10-CM | POA: Diagnosis present

## 2014-10-23 DIAGNOSIS — E785 Hyperlipidemia, unspecified: Secondary | ICD-10-CM | POA: Diagnosis present

## 2014-10-23 DIAGNOSIS — Z8673 Personal history of transient ischemic attack (TIA), and cerebral infarction without residual deficits: Secondary | ICD-10-CM

## 2014-10-23 DIAGNOSIS — Z8551 Personal history of malignant neoplasm of bladder: Secondary | ICD-10-CM | POA: Diagnosis not present

## 2014-10-23 DIAGNOSIS — Z87891 Personal history of nicotine dependence: Secondary | ICD-10-CM

## 2014-10-23 DIAGNOSIS — J449 Chronic obstructive pulmonary disease, unspecified: Secondary | ICD-10-CM | POA: Diagnosis present

## 2014-10-23 DIAGNOSIS — Z9889 Other specified postprocedural states: Secondary | ICD-10-CM

## 2014-10-23 DIAGNOSIS — Z85528 Personal history of other malignant neoplasm of kidney: Secondary | ICD-10-CM | POA: Diagnosis not present

## 2014-10-23 DIAGNOSIS — I9589 Other hypotension: Secondary | ICD-10-CM | POA: Diagnosis present

## 2014-10-23 DIAGNOSIS — D631 Anemia in chronic kidney disease: Secondary | ICD-10-CM | POA: Diagnosis present

## 2014-10-23 DIAGNOSIS — Z905 Acquired absence of kidney: Secondary | ICD-10-CM | POA: Diagnosis present

## 2014-10-23 DIAGNOSIS — Y95 Nosocomial condition: Secondary | ICD-10-CM | POA: Diagnosis not present

## 2014-10-23 DIAGNOSIS — E875 Hyperkalemia: Secondary | ICD-10-CM | POA: Diagnosis not present

## 2014-10-23 DIAGNOSIS — Z7982 Long term (current) use of aspirin: Secondary | ICD-10-CM

## 2014-10-23 DIAGNOSIS — Z951 Presence of aortocoronary bypass graft: Secondary | ICD-10-CM | POA: Diagnosis not present

## 2014-10-23 DIAGNOSIS — K760 Fatty (change of) liver, not elsewhere classified: Secondary | ICD-10-CM | POA: Diagnosis present

## 2014-10-23 DIAGNOSIS — T827XXA Infection and inflammatory reaction due to other cardiac and vascular devices, implants and grafts, initial encounter: Secondary | ICD-10-CM | POA: Diagnosis present

## 2014-10-23 DIAGNOSIS — B9562 Methicillin resistant Staphylococcus aureus infection as the cause of diseases classified elsewhere: Secondary | ICD-10-CM | POA: Diagnosis present

## 2014-10-23 DIAGNOSIS — N186 End stage renal disease: Secondary | ICD-10-CM | POA: Diagnosis present

## 2014-10-23 DIAGNOSIS — I493 Ventricular premature depolarization: Secondary | ICD-10-CM | POA: Diagnosis present

## 2014-10-23 DIAGNOSIS — M109 Gout, unspecified: Secondary | ICD-10-CM | POA: Diagnosis present

## 2014-10-23 DIAGNOSIS — Z992 Dependence on renal dialysis: Secondary | ICD-10-CM | POA: Diagnosis not present

## 2014-10-23 DIAGNOSIS — R0602 Shortness of breath: Secondary | ICD-10-CM | POA: Insufficient documentation

## 2014-10-23 DIAGNOSIS — Z8249 Family history of ischemic heart disease and other diseases of the circulatory system: Secondary | ICD-10-CM | POA: Diagnosis not present

## 2014-10-23 DIAGNOSIS — I739 Peripheral vascular disease, unspecified: Secondary | ICD-10-CM | POA: Diagnosis present

## 2014-10-23 DIAGNOSIS — I2584 Coronary atherosclerosis due to calcified coronary lesion: Secondary | ICD-10-CM

## 2014-10-23 DIAGNOSIS — R079 Chest pain, unspecified: Secondary | ICD-10-CM | POA: Diagnosis present

## 2014-10-23 DIAGNOSIS — R509 Fever, unspecified: Secondary | ICD-10-CM

## 2014-10-23 DIAGNOSIS — I2510 Atherosclerotic heart disease of native coronary artery without angina pectoris: Secondary | ICD-10-CM | POA: Diagnosis present

## 2014-10-23 DIAGNOSIS — J189 Pneumonia, unspecified organism: Secondary | ICD-10-CM | POA: Diagnosis not present

## 2014-10-23 DIAGNOSIS — Z79899 Other long term (current) drug therapy: Secondary | ICD-10-CM | POA: Diagnosis not present

## 2014-10-23 DIAGNOSIS — L0291 Cutaneous abscess, unspecified: Secondary | ICD-10-CM

## 2014-10-23 LAB — BASIC METABOLIC PANEL
Anion gap: 18 — ABNORMAL HIGH (ref 5–15)
BUN: 62 mg/dL — ABNORMAL HIGH (ref 6–23)
CALCIUM: 8.7 mg/dL (ref 8.4–10.5)
CO2: 23 mEq/L (ref 19–32)
Chloride: 97 mEq/L (ref 96–112)
Creatinine, Ser: 8.3 mg/dL — ABNORMAL HIGH (ref 0.50–1.35)
GFR, EST AFRICAN AMERICAN: 6 mL/min — AB (ref >90)
GFR, EST NON AFRICAN AMERICAN: 5 mL/min — AB (ref >90)
Glucose, Bld: 96 mg/dL (ref 70–99)
POTASSIUM: 6.3 meq/L — AB (ref 3.7–5.3)
Sodium: 138 mEq/L (ref 137–147)

## 2014-10-23 LAB — CBC
HCT: 25.5 % — ABNORMAL LOW (ref 39.0–52.0)
HEMOGLOBIN: 8 g/dL — AB (ref 13.0–17.0)
MCH: 35.6 pg — ABNORMAL HIGH (ref 26.0–34.0)
MCHC: 31.4 g/dL (ref 30.0–36.0)
MCV: 113.3 fL — AB (ref 78.0–100.0)
Platelets: 107 10*3/uL — ABNORMAL LOW (ref 150–400)
RBC: 2.25 MIL/uL — AB (ref 4.22–5.81)
RDW: 16.8 % — ABNORMAL HIGH (ref 11.5–15.5)
WBC: 5.5 10*3/uL (ref 4.0–10.5)

## 2014-10-23 LAB — I-STAT TROPONIN, ED: Troponin i, poc: 0.07 ng/mL (ref 0.00–0.08)

## 2014-10-23 LAB — PREPARE RBC (CROSSMATCH)

## 2014-10-23 LAB — PRO B NATRIURETIC PEPTIDE: PRO B NATRI PEPTIDE: 30772 pg/mL — AB (ref 0–450)

## 2014-10-23 LAB — TROPONIN I: Troponin I: <0.3 ng/mL (ref <0.30)

## 2014-10-23 LAB — HEPATITIS B SURFACE ANTIGEN: Hepatitis B Surface Ag: NEGATIVE

## 2014-10-23 MED ORDER — VANCOMYCIN HCL IN DEXTROSE 1-5 GM/200ML-% IV SOLN
1000.0000 mg | INTRAVENOUS | Status: DC
  Administered 2014-10-23: 1000 mg via INTRAVENOUS
  Filled 2014-10-23 (×2): qty 200

## 2014-10-23 MED ORDER — LIDOCAINE-PRILOCAINE 2.5-2.5 % EX CREA
1.0000 "application " | TOPICAL_CREAM | CUTANEOUS | Status: DC | PRN
  Filled 2014-10-23: qty 5

## 2014-10-23 MED ORDER — DOXERCALCIFEROL 4 MCG/2ML IV SOLN
3.0000 ug | INTRAVENOUS | Status: DC
  Administered 2014-10-23 – 2014-11-02 (×5): 3 ug via INTRAVENOUS
  Filled 2014-10-23 (×6): qty 2

## 2014-10-23 MED ORDER — POLYETHYLENE GLYCOL 3350 17 G PO PACK
17.0000 g | PACK | Freq: Every day | ORAL | Status: DC
  Administered 2014-10-24 – 2014-11-02 (×9): 17 g via ORAL
  Filled 2014-10-23 (×13): qty 1

## 2014-10-23 MED ORDER — ALTEPLASE 2 MG IJ SOLR
2.0000 mg | Freq: Once | INTRAMUSCULAR | Status: AC | PRN
  Filled 2014-10-23: qty 2

## 2014-10-23 MED ORDER — SODIUM CHLORIDE 0.9 % IV SOLN
1750.0000 mg | INTRAVENOUS | Status: DC
  Filled 2014-10-23: qty 1750

## 2014-10-23 MED ORDER — CINACALCET HCL 30 MG PO TABS
30.0000 mg | ORAL_TABLET | Freq: Every day | ORAL | Status: DC
  Administered 2014-10-24 – 2014-11-04 (×10): 30 mg via ORAL
  Filled 2014-10-23 (×14): qty 1

## 2014-10-23 MED ORDER — POLYETHYLENE GLYCOL 3350 17 GM/SCOOP PO POWD
17.0000 g | Freq: Every day | ORAL | Status: DC
  Filled 2014-10-23: qty 255

## 2014-10-23 MED ORDER — GABAPENTIN 100 MG PO CAPS
200.0000 mg | ORAL_CAPSULE | Freq: Every day | ORAL | Status: DC
  Administered 2014-10-24 – 2014-11-03 (×11): 200 mg via ORAL
  Filled 2014-10-23 (×13): qty 2

## 2014-10-23 MED ORDER — HEPARIN SODIUM (PORCINE) 1000 UNIT/ML DIALYSIS
20.0000 [IU]/kg | INTRAMUSCULAR | Status: DC | PRN
  Filled 2014-10-23: qty 2

## 2014-10-23 MED ORDER — HEPARIN BOLUS VIA INFUSION
4000.0000 [IU] | Freq: Once | INTRAVENOUS | Status: DC
  Filled 2014-10-23: qty 4000

## 2014-10-23 MED ORDER — SEVELAMER CARBONATE 800 MG PO TABS
2400.0000 mg | ORAL_TABLET | Freq: Three times a day (TID) | ORAL | Status: DC
  Administered 2014-10-24 – 2014-11-04 (×27): 2400 mg via ORAL
  Filled 2014-10-23 (×37): qty 3

## 2014-10-23 MED ORDER — SODIUM CHLORIDE 0.9 % IV SOLN
100.0000 mL | INTRAVENOUS | Status: DC | PRN
  Administered 2014-11-01: 07:00:00 via INTRAVENOUS

## 2014-10-23 MED ORDER — ALBUTEROL SULFATE (2.5 MG/3ML) 0.083% IN NEBU
3.0000 mL | INHALATION_SOLUTION | Freq: Four times a day (QID) | RESPIRATORY_TRACT | Status: DC | PRN
  Administered 2014-10-24 – 2014-11-01 (×5): 3 mL via RESPIRATORY_TRACT
  Filled 2014-10-23 (×5): qty 3

## 2014-10-23 MED ORDER — SEVELAMER CARBONATE 800 MG PO TABS
1600.0000 mg | ORAL_TABLET | ORAL | Status: DC | PRN
  Filled 2014-10-23: qty 2

## 2014-10-23 MED ORDER — PENTAFLUOROPROP-TETRAFLUOROETH EX AERO
1.0000 "application " | INHALATION_SPRAY | CUTANEOUS | Status: DC | PRN
  Filled 2014-10-23: qty 30

## 2014-10-23 MED ORDER — ATORVASTATIN CALCIUM 10 MG PO TABS
10.0000 mg | ORAL_TABLET | Freq: Every day | ORAL | Status: DC
  Administered 2014-10-24 – 2014-11-03 (×11): 10 mg via ORAL
  Filled 2014-10-23 (×13): qty 1

## 2014-10-23 MED ORDER — DEXTROSE 5 % IV SOLN
2.0000 g | INTRAVENOUS | Status: DC
  Filled 2014-10-23 (×2): qty 2

## 2014-10-23 MED ORDER — VANCOMYCIN HCL 10 G IV SOLR
1750.0000 mg | INTRAVENOUS | Status: DC
  Filled 2014-10-23: qty 1750

## 2014-10-23 MED ORDER — GABAPENTIN 100 MG PO CAPS
100.0000 mg | ORAL_CAPSULE | Freq: Every day | ORAL | Status: DC
  Administered 2014-10-24 – 2014-11-04 (×11): 100 mg via ORAL
  Filled 2014-10-23 (×12): qty 1

## 2014-10-23 MED ORDER — ASPIRIN 81 MG PO CHEW
162.0000 mg | CHEWABLE_TABLET | Freq: Once | ORAL | Status: AC
  Administered 2014-10-23: 162 mg via ORAL
  Filled 2014-10-23: qty 2

## 2014-10-23 MED ORDER — SODIUM CHLORIDE 0.9 % IV SOLN
Freq: Once | INTRAVENOUS | Status: AC
  Administered 2014-10-24: via INTRAVENOUS

## 2014-10-23 MED ORDER — MIDODRINE HCL 5 MG PO TABS
ORAL_TABLET | ORAL | Status: AC
  Administered 2014-10-23: 10 mg via ORAL
  Filled 2014-10-23: qty 2

## 2014-10-23 MED ORDER — NEPRO/CARBSTEADY PO LIQD
237.0000 mL | ORAL | Status: DC | PRN
  Filled 2014-10-23: qty 237

## 2014-10-23 MED ORDER — NEPRO/CARBSTEADY PO LIQD
237.0000 mL | Freq: Two times a day (BID) | ORAL | Status: DC
  Administered 2014-10-24 – 2014-11-04 (×11): 237 mL via ORAL
  Filled 2014-10-23 (×27): qty 237

## 2014-10-23 MED ORDER — ASPIRIN EC 81 MG PO TBEC
81.0000 mg | DELAYED_RELEASE_TABLET | Freq: Every day | ORAL | Status: DC
  Administered 2014-10-24 – 2014-11-03 (×12): 81 mg via ORAL
  Filled 2014-10-23 (×15): qty 1

## 2014-10-23 MED ORDER — HEPARIN (PORCINE) IN NACL 100-0.45 UNIT/ML-% IJ SOLN
1200.0000 [IU]/h | INTRAMUSCULAR | Status: DC
  Administered 2014-10-24: 950 [IU]/h via INTRAVENOUS
  Filled 2014-10-23 (×3): qty 250

## 2014-10-23 MED ORDER — ALLOPURINOL 100 MG PO TABS
100.0000 mg | ORAL_TABLET | Freq: Two times a day (BID) | ORAL | Status: DC
Start: 2014-10-23 — End: 2014-11-04
  Administered 2014-10-24 – 2014-11-04 (×22): 100 mg via ORAL
  Filled 2014-10-23 (×26): qty 1

## 2014-10-23 MED ORDER — DOCUSATE SODIUM 100 MG PO CAPS: 100.0000 mg | ORAL_CAPSULE | Freq: Every day | ORAL | Status: DC | PRN

## 2014-10-23 MED ORDER — DOXERCALCIFEROL 4 MCG/2ML IV SOLN
INTRAVENOUS | Status: AC
  Administered 2014-10-23: 3 ug via INTRAVENOUS
  Filled 2014-10-23: qty 2

## 2014-10-23 MED ORDER — LIDOCAINE HCL (PF) 1 % IJ SOLN: 5.0000 mL | INTRAMUSCULAR | Status: DC | PRN

## 2014-10-23 MED ORDER — MIDODRINE HCL 5 MG PO TABS
10.0000 mg | ORAL_TABLET | Freq: Three times a day (TID) | ORAL | Status: DC
  Administered 2014-10-23 – 2014-11-04 (×29): 10 mg via ORAL
  Filled 2014-10-23 (×37): qty 2

## 2014-10-23 MED ORDER — DARBEPOETIN ALFA 200 MCG/0.4ML IJ SOSY
200.0000 ug | PREFILLED_SYRINGE | INTRAMUSCULAR | Status: DC
  Administered 2014-10-26 – 2014-11-02 (×2): 200 ug via INTRAVENOUS
  Filled 2014-10-23 (×2): qty 0.4

## 2014-10-23 MED ORDER — SODIUM CHLORIDE 0.9 % IV SOLN: 100.0000 mL | INTRAVENOUS | Status: DC | PRN

## 2014-10-23 MED ORDER — HEPARIN SODIUM (PORCINE) 1000 UNIT/ML DIALYSIS
1000.0000 [IU] | INTRAMUSCULAR | Status: DC | PRN
  Filled 2014-10-23: qty 1

## 2014-10-23 NOTE — ED Notes (Signed)
Pt in c/o central chest pain, denies any at rest and but reports pain with exertion, states if he walks around at all he c/o shortness of breath and then the pain will start, pt was due for dialysis treatment today, no distress at this time

## 2014-10-23 NOTE — Progress Notes (Signed)
Pt transferred from [facility] via charge RN on 2W in stable condition.

## 2014-10-23 NOTE — Procedures (Addendum)
I have personally attended this patient's dialysis session.   2K bath K of 6.3 Pt to get his midodrine 10 mg pre-Rx Hb is 8.0 so will transfuse 1 unit  Patient's left thigh AVG was revised on 10/1 - re-routed the arterial half of the loop, and on 11/5 a short segment of infected/exposed/non-functional AVG material was removed.  It was examined by Dr. Kellie Simmering on 11/24 and at time there was no expressible pus  There is now grossly purulent material coming from the non-functional portion of the thigh AVG.  Have cultured the pus and will empirically start vancomycin and fortaz. Consult to [facility].   Will cannulate the medial aspect of the functional graft - does not appear that the lateral side has been cannulated since the AVG was revised on 10/1.  Jamal Maes, MD [facility] [phone] Pager 10/23/2014, 2:57 PM

## 2014-10-23 NOTE — H&P (Signed)
Date: 10/23/2014               Patient Name:  Andrew [name] MRN: [id number]  DOB: 1926/06/11 Age / Sex: 80 y.o., male   PCP: Redmond School, MD         Medical Service: Internal Medicine Teaching Service         Attending Physician: Dr. Orlie Dakin, MD    First Contact: Dr. Charlott Rakes Pager: [phone]  Second Contact: Dr. Bing Neighbors Pager: [phone]       After Hours (After 5p/  First Contact Pager: [phone]  weekends / holidays): Second Contact Pager: [phone]   Chief Complaint: chest pain  History of Present Illness: Andrew [name] is an 80 year old with ESRD (M/W/F), CAD s/p CABG 1998, myelodysplastic syndrome, combined systolic and diastolic CHF (EF 93-57%, Aug 2014) who presents with chest pain. History was collected from him while he was in dialysis.   For the past 3 days, he reports having a chest pain associated with exertion which is similar to the one he had back in 1998 that warranted his CABG. Pain is accompanied by shortness of breath and only improves with rest. His pain has worsened to the point that he could not take more than five steps which is what prompted him to come to the ED. He takes ASA $RemoveB'81mg'slojijTC$  every day but denies any associated diaphoresis, cough, fever, nausea, vomiting, abdominal pain, sick contacts. He has a 25-pack year smoking history though denies any alcohol use.   He gets dialysis M/W/F and reports blood coming out of his L thigh AV fistula that he has to "put on his gloves and clean" every other day but denies any pain at the site. Per chart review, he has a history of recurrent graft site infections though I am unable to find the culture data itself. Per Renal, most recent cultures (11/13) at [facility]/E. Faecalis, Bacteroides, and Veillonella.   In the ED, Nephrology was consulted and removed some purulent material from the non-functional portion of the thigh AV graft and started him on vancomycin and Fortaz. Cardiology was consulted and recommended heparin  until ACS r/o.    Meds: Current Facility-Administered Medications  Medication Dose Route Frequency Provider Last Rate Last Dose  . 0.9 %  sodium chloride infusion  100 mL Intravenous PRN Myriam Jacobson, PA-C      . 0.9 %  sodium chloride infusion  100 mL Intravenous PRN Myriam Jacobson, PA-C      . 0.9 %  sodium chloride infusion   Intravenous Once Myriam Jacobson, PA-C      . alteplase (CATHFLO ACTIVASE) injection 2 mg  2 mg Intracatheter Once PRN Myriam Jacobson, PA-C      . Derrill Memo ON 10/26/2014] cefTAZidime (FORTAZ) 2 g in dextrose 5 % 50 mL IVPB  2 g Intravenous Q M,W,F-HD Jamal Maes, MD      . Derrill Memo ON 10/26/2014] Darbepoetin Alfa (ARANESP) injection 200 mcg  200 mcg Intravenous Q Mon-HD Myriam Jacobson, PA-C      . Derrill Memo ON 10/26/2014] doxercalciferol (HECTOROL) injection 3 mcg  3 mcg Intravenous Q M,W,F-HD Myriam Jacobson, PA-C      . feeding supplement (NEPRO CARB STEADY) liquid 237 mL  237 mL Oral PRN Myriam Jacobson, PA-C      . heparin injection 1,000 Units  1,000 Units Dialysis PRN Myriam Jacobson, PA-C      . heparin injection 1,600 Units  20 Units/kg Dialysis  PRN Myriam Jacobson, PA-C      . lidocaine (PF) (XYLOCAINE) 1 % injection 5 mL  5 mL Intradermal PRN Myriam Jacobson, PA-C      . lidocaine-prilocaine (EMLA) cream 1 application  1 application Topical PRN Myriam Jacobson, PA-C      . midodrine (PROAMATINE) tablet 10 mg  10 mg Oral TID WC Jamal Maes, MD   10 mg at 10/23/14 1436  . pentafluoroprop-tetrafluoroeth (GEBAUERS) aerosol 1 application  1 application Topical PRN Myriam Jacobson, PA-C       Current Outpatient Prescriptions  Medication Sig Dispense Refill  . acetaminophen (TYLENOL) 500 MG tablet Take 500 mg by mouth every 6 (six) hours as needed. For pain/headache    . albuterol (PROVENTIL HFA;VENTOLIN HFA) 108 (90 BASE) MCG/ACT inhaler Inhale 2 puffs into the lungs every 6 (six) hours as needed for wheezing or shortness of breath. 1  Inhaler 0  . allopurinol (ZYLOPRIM) 100 MG tablet Take 100 mg by mouth 2 (two) times daily.     Marland Kitchen aspirin EC 81 MG tablet Take 81 mg by mouth at bedtime.     Marland Kitchen atorvastatin (LIPITOR) 10 MG tablet Take 10 mg by mouth every morning.     . B Complex-C-Folic Acid (DIALYVITE TABLET) TABS Take 1 tablet by mouth at bedtime.     . calcium carbonate (TUMS - DOSED IN MG ELEMENTAL CALCIUM) 500 MG chewable tablet Chew 1 tablet by mouth daily as needed for heartburn. For heartburn    . cinacalcet (SENSIPAR) 30 MG tablet Take 30 mg by mouth daily with breakfast.     . colchicine 0.6 MG tablet Take 0.6 mg by mouth 2 (two) times daily as needed (for gout).    Marland Kitchen docusate sodium (COLACE) 100 MG capsule Take 100 mg by mouth daily as needed for mild constipation.     Marland Kitchen econazole nitrate 1 % cream Apply 1 application topically daily as needed (Athletes foot).    . Emollient (LUBRIDERM SERIOUSLY SENSITIVE) LOTN Apply 1 application topically daily.    Marland Kitchen gabapentin (NEURONTIN) 100 MG capsule Take 100-200 mg by mouth 2 (two) times daily. Takes 1 in the morning and 2 at night    . lenalidomide (REVLIMID) 5 MG capsule Take 5 mg by mouth See admin instructions. When neutrophils are above 1. Takes once weekly prn    . midodrine (PROAMATINE) 10 MG tablet Take 10-20 mg by mouth See admin instructions. On Dialysis days (M,W,F), patient takes 2 tabs in the morning and 1 in the evening and on the other days patient takes 1 tablet in the morning and 2 in teh evening    . oxyCODONE-acetaminophen (PERCOCET/ROXICET) 5-325 MG per tablet Take 1 tablet by mouth every 6 (six) hours as needed. 20 tablet 0  . polyethylene glycol powder (MIRALAX) powder Take 17 g by mouth at bedtime.     . Pramoxine-Menthol (GOLD BOND MAXIMUM RELIEF EX) Apply 1 application topically daily as needed (Itching).    . sevelamer (RENVELA) 800 MG tablet Take 1,600-2,400 mg by mouth See admin instructions. Take 3 tabs with each meals and 2 tabs with snacks    .  Tbo-Filgrastim (GRANIX) 480 MCG/0.8ML SOSY injection Inject 480 mcg into the skin once a week. Tuesdays     Facility-Administered Medications Ordered in Other Encounters  Medication Dose Route Frequency Provider Last Rate Last Dose  . methylPREDNISolone sodium succinate (SOLU-MEDROL) 125 mg/2 mL injection 60 mg  60 mg Intravenous Once Sol Blazing,  MD        Allergies: Allergies as of 10/23/2014  . (No Known Allergies)   Past Medical History  Diagnosis Date  . ESRD on hemodialysis     Hemodialysis since 2003; Occluded access in both upper extremities and the right lower extremity, current access as of Aug 2014 is L thigh AVG. Gets HD MWF at [facility].     . Duodenal ulcer     remote  . Chronic combined systolic and diastolic CHF (congestive heart failure)     a. 02/2012 Echo: EF 65%, basal infolat AK, mild conc LVH;  b. 06/2013 Echo: EF 40-45%, mild LVH, Gr2 DD, basal inf HK->AK, Mod AS, Mild MR, PASP 73  . Arteriosclerotic cardiovascular disease (ASCVD) 1998    a. [id number]; b. 06/2003 Cath: LM 70d, LAD 70ost, 80p, LCX 70p/183m, RCA 80p/119m, LIMA->LAD patent, VG->OM2->OM3 patent, VG->dRCA patent, EF 55%;  c. 02/2012 [facility] MV: inf/inflat scarring w/ ? minimal peri-infarct ischemia.  . Cerebrovascular disease   . Neuropathy of foot   . Degenerative joint disease   . Gastroesophageal reflux disease   . Impaired hearing     Bilateral; hearing aids relatively ineffective  . Hypertension   . Cholelithiasis 03/2011    Diagnosed incidentally on abdominal ultrasound in 03/2011  . Hepatic steatosis   . Macrocytosis 10/20/2012  . Thrombocytopenia 10/20/2012  . Hyperlipidemia   . Secondary hyperparathyroidism   . Peripheral vascular disease   . Gout   . Moderate aortic stenosis     a. 06/2013 Echo: Mod AS, mean grad: 25, peak grad: 57.  . Pulmonary hypertension     a. 06/2013 Echo: PASP 84mmHg.  Marland Kitchen Anemia     Prior blood transfusion  . Complication of anesthesia     daughter  reports long episodes confusion after amnesia meds  . Renal cell carcinoma 2001    s/p right nephrectomy-2001; subsequent ESRD  . 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 Hypotension   . Pancytopenia   . MRSA infection    Past Surgical History  Procedure Laterality Date  . Right nephrectomy  2001    Renal cell carcinoma  . Colonoscopy  01/24/2011    prominent vascular pattern, suboptimal prep but doable  . Esophagogastroduodenoscopy  01/24/2011    mild erosive reflux esophagitis, bulbar/antral erosions, bx from antrum benign  . Av fistula repair  2008    revision of anastomosis of Right AVF  . Arteriovenous graft placement  2006    Thrombectomy and interposition jump graft revision to higher axillary vein of LUA AVG  . Coronary artery bypass graft  1998    X4 VESSELS  . Multiple cysto/ resection tumor's with bx's  LAST ONE 05-09-2011  . Multiple surg's / interventions for avgg/ fistula right upper arm  LAST REVISION 06-29-2011    ([id number] VEIN/ [id number] DILATATION ANGIOPLASTY)  . Left ptc left renal artery    . Cardiac catheterization  2004  . Cataract extraction w/ intraocular lens  implant, bilateral    . Cystoscopy w/ retrogrades  12/28/2011    Procedure: CYSTOSCOPY WITH RETROGRADE PYELOGRAM;  Surgeon: Malka So, MD;  Location: [facility];  Service: Urology;  Laterality: Left;  C-ARM   . Transurethral resection of bladder tumor  12/28/2011    Procedure: TRANSURETHRAL RESECTION OF BLADDER TUMOR (TURBT);  Surgeon: Malka So, MD;  Location: [facility];  Service: Urology;  Laterality: N/A;  . Av fistula placement  02/29/2012    Procedure: INSERTION OF ARTERIOVENOUS (AV) GORE-TEX GRAFT THIGH;  Surgeon: Rosetta Posner, MD;  Location: [place];  Service: Vascular;  Laterality: Right;  Insertion right femoral arteriovenous gortex graft  . Ligation of right braciocephalic av fistula  [id number]  . Insertion of dialysis  catheter  06/05/2012    Procedure: INSERTION OF DIALYSIS CATHETER;  Surgeon: Mal Misty, MD;  Location: [place];  Service: Vascular;  Laterality: Left;  Insertion diatek catheter left IJ  . Insertion of dialysis catheter  08/22/2012    Procedure: INSERTION OF DIALYSIS CATHETER;  Surgeon: Serafina Mitchell, MD;  Location: [facility];  Service: Vascular;  Laterality: Left;  insertion of dialysis catheter left  internal jugular 27cm  . Av fistula placement  10/08/2012    Procedure: INSERTION OF ARTERIOVENOUS (AV) GORE-TEX GRAFT THIGH;  Surgeon: Angelia Mould, MD;  Location: [place];  Service: Vascular;  Laterality: Left;  . Cystoscopy with biopsy N/A 08/07/2013    Procedure: CYSTOSCOPY WITH BLADDER BIOPSY;  Surgeon: Malka So, MD;  Location: [facility];  Service: Urology;  Laterality: N/A;  . Fulguration of bladder tumor N/A 08/07/2013    Procedure: FULGURATION OF BLADDER TUMOR;  Surgeon: Malka So, MD;  Location: [facility];  Service: Urology;  Laterality: N/A;  . Revision of arteriovenous goretex graft Left 08/27/2014    Procedure: THROMBECTOMY & REVISION OF LEFT ARM  ARTERIOVENOUS GORETEX GRAFT;  Surgeon: Angelia Mould, MD;  Location: [place];  Service: Vascular;  Laterality: Left;  . Revision of arteriovenous goretex graft Left 10/01/2014    Procedure: SEGMENT OF Left THIGH ARTERIOVENOUS GORETEX GRAFT Removed;  Surgeon: Mal Misty, MD;  Location: [facility];  Service: Vascular;  Laterality: Left;   Family History  Problem Relation Age of Onset  . Colon cancer Neg Hx   . Liver disease Neg Hx   . Anesthesia problems Neg Hx   . Heart disease Father   . Other Mother     died @ 78 with respiratory issues following hip fx  . Diabetes Mother    History   Social History  . Marital Status: Widowed    Spouse Name: N/A    Number of Children: 3  . Years of Education: N/A   Occupational History  . Retired    Social History Main Topics  . Smoking status: Former Smoker -- 1.00 packs/day  for 25 years    Types: Cigarettes    Quit date: 02/26/1984  . Smokeless tobacco: Former Systems developer     Comment: quit 1985  . Alcohol Use: No  . Drug Use: No  . Sexual Activity: Not Currently   Other Topics Concern  . Not on file   Social History Narrative   Lives in [place] by himself.  Family near by.    Review of Systems: Review of Systems  Constitutional: Negative for fever and diaphoresis.  Respiratory: Positive for shortness of breath. Negative for cough.   Cardiovascular: Positive for chest pain.  Gastrointestinal: Negative for nausea, vomiting, abdominal pain and diarrhea.    Physical Exam: Blood pressure 138/68, pulse 80, temperature 97.7 F (36.5 C), temperature source Oral, resp. rate 22, weight 0 lb (0 kg), SpO2 95 %.  General: resting in bed, NAD, hard of hearing HEENT: constricted pupils at baseline and difficult to assess pupillary response, EOMI, no scleral icterus, oropharynx clear Cardiac: RRR, no rubs, murmurs or gallops, scar at incision site along sternum consistent with prior CABG procedure Pulm:  clear to auscultation bilaterally, no wheezes, rales, or rhonchi Abd: soft, nontender, nondistended, BS present Ext: feet cool to touch with difficult to palpate DP/PT pulses though capillary refill present without appearing cyanotic or mottled, trace 1+ pitting edema, left thigh with graft though covered in bandage in the more lateral aspect Neuro: responds to questions appropriately; moving all extremities freely   Lab results: Basic Metabolic Panel:  Recent Labs  10/23/14 1116  NA 138  K 6.3*  CL 97  CO2 23  GLUCOSE 96  BUN 62*  CREATININE 8.30*  CALCIUM 8.7   CBC:  Recent Labs  10/23/14 1116  WBC 5.5  HGB 8.0*  HCT 25.5*  MCV 113.3*  PLT 107*   Cardiac Enzymes:  Recent Labs  10/23/14 1120  TROPONINI <0.30   BNP:  Recent Labs  10/23/14 1117  PROBNP 30772.0*   Imaging results:  Dg Chest Port 1 View  10/23/2014   CLINICAL  DATA:  80 year old with shortness of breath and cough  EXAM: PORTABLE CHEST - 1 VIEW  COMPARISON:  04/21/2014  FINDINGS: Median sternotomy wires, surgical clips and CABG markers are present. Metallic clips project in the left axilla. A vascular stent projects over the right shoulder.  The cardiac silhouette is enlarged. The mediastinal contours are within normal limits. Atherosclerotic aortic calcifications are present.  Interstitial prominence is present in the lung bases, right slightly greater than left. There is no pleural effusion on the right. The left costophrenic angle is excluded from the field of view, limiting evaluation. There is no pneumothorax.  No acute osseous abnormality is identified.  IMPRESSION: 1. Mild cardiomegaly with probable mild interstitial pulmonary edema. Interstitial pneumonitis could have a similar appearance. Clinical correlation is recommended. 2. Prior CABG.   Electronically Signed   By: Rosemarie Ax   On: 10/23/2014 12:09    Other results: EKG: Reviewed and compared with 08/27/14 Normal axis T-wave flattening in V4-6   Assessment & Plan by Problem:  Mr. Phillips is an 80 year old with multiple medical co-morbidities who presents with chest pain found to have graft site infection  Chest pain: Symptoms concerning for ACS given the similarity of them as compared to his prior episode. TIMI score 4/7. Plan for cath on Monday.  -Follow-up echo -Trend troponins x 3 -Continue ASA 81mg  & Lipitor 10mg  -Continue heparin per Cardiology -Cardiology following, appreciate recs  Recurrent graft site infections: Pus drainage is new per him though he is without fever or pain at the site. Surgical exploration pending resolution of his cardiac symptoms per Vascular Surgery.  -Continue Tressie Ellis & vancomycin (Day 1) -Continue assessing for signs of infection  ESRD: Getting dialysis today. -Continue Sevelamer, Hectorol, mitodrine -Continue Nepro BID between meals -Nephrology  following, appreciate recs  MDS: Follows with Dr. Alvy Bimler. Revlimid deferred until ANC>1000. Hb goal 8.  -Transfuse pRBC x 1 per Renal as he was symptomatic with chest pain  Combined systolic and diastolic CHF: EF 09-60% by echo in Aug 2014. BNP [id number], up from 25066 (July 2014).   Gout: Continue allopurinol 100mg .  HLD: Continue statin as noted above.  Peripheral neuropathy: Continue home gabapentin.  Presumed COPD: No PFTs on file though will continue home albuterol.  #FEN:  -Diet: Renal  #DVT prophylaxis: heparin 5000 units subcutaneous  #CODE STATUS: FULL CODE -Will need to be confirmed with him at bedside as it was inappropriate to discuss in dialysis unit with other patients at the   Dispo: Disposition is deferred at this time, awaiting improvement  of current medical problems.   The patient does have a current PCP Redmond School, MD) and does not need an [facility] hospital follow-up appointment after discharge.  The patient does have transportation limitations that hinder transportation to clinic appointments.  Signed: Charlott Rakes, MD 10/23/2014, 9:04 PM

## 2014-10-23 NOTE — Consult Note (Signed)
Consult Note  Patient ID: Andrew [name] MRN: [id number], DOB: 1926/01/31 Date of Encounter: 10/23/2014, 2:05 PM Primary Physician: [name]., MD Primary Cardiologist: previously [place]  Reason for Admission: chest pain  HPI:  Andrew [name] is a 80 y.o. male with a past medical history significant for CAD (s/p CABG 1998), ESRD on HD, myelodysplastic syndrome (followed by heme-onc), anemia, combined systolic and diastolic heart failure (EF 40-45% by echo 2014), moderate AS.  He was last seen by cardiology 05/2013 at which time he was without recurrent chest pain.  He states that since Tuesday, he has had predictable chest pain with exertion that resolves after resting for 5-10 minutes.  The pain is midsternal and described as pressure.  It is the same type of pain he was having prior to bypass surgery in 1998.  He has chronic dyspnea on exertion which he and his family attribute to being related to anemia.   He denies recent fevers, chills, sweats.  He has not had palpitations or frank syncope.  He has had recent new HD graft placed in LLE.  He is having drainage from prior graft that is bloody but he reports no pus.    Lab work in the ER is notable for negative troponin, Hgb 8, K 6.3, BNP 30,772, WBC 5.5 (baseline around 2).   He is being admitted by the IM service, we have been asked to consult for chest pain.   Past Medical History  Diagnosis Date  . ESRD on hemodialysis     Hemodialysis since 2003; Occluded access in both upper extremities and the right lower extremity, current access as of Aug 2014 is L thigh AVG. Gets HD MWF at [facility].     . Duodenal ulcer     remote  . Chronic combined systolic and diastolic CHF (congestive heart failure)     a. 02/2012 Echo: EF 65%, basal infolat AK, mild conc LVH;  b. 06/2013 Echo: EF 40-45%, mild LVH, Gr2 DD, basal inf HK->AK, Mod AS, Mild MR, PASP 73  . Arteriosclerotic cardiovascular disease (ASCVD) 1998    a. [id number];  b. 06/2003 Cath: LM 70d, LAD 70ost, 80p, LCX 70p/186m RCA 80p/1029mLIMA->LAD patent, VG->OM2->OM3 patent, VG->dRCA patent, EF 55%;  c. 02/2012 Lexi MV: inf/inflat scarring w/ ? minimal peri-infarct ischemia.  . Cerebrovascular disease   . Neuropathy of foot   . Degenerative joint disease   . Gastroesophageal reflux disease   . Impaired hearing     Bilateral; hearing aids relatively ineffective  . Hypertension   . Cholelithiasis 03/2011    Diagnosed incidentally on abdominal ultrasound in 03/2011  . Hepatic steatosis   . Macrocytosis 10/20/2012  . Thrombocytopenia 10/20/2012  . Hyperlipidemia   . Secondary hyperparathyroidism   . Peripheral vascular disease   . Gout   . Moderate aortic stenosis     a. 06/2013 Echo: Mod AS, mean grad: 25, peak grad: 57.  . Pulmonary hypertension     a. 06/2013 Echo: PASP 7348m.  . AMarland Kitchenemia     Prior blood transfusion  . Complication of anesthesia     daughter reports long episodes confusion after amnesia meds  . Renal cell carcinoma 2001    s/p right nephrectomy-2001; subsequent ESRD  . 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 Hypotension   . Pancytopenia   . MRSA infection      Most Recent Cardiac Studies: Echo 06-2013 - EF 40-45%, hypokinesis of anterolateral wall and basal to mid posterior  wall; basal inferior wall severely hypokinetic to akinetic; grade 2 diastolic dysfunction, moderate AS, mild MR, LA mildly dilated Myoview 02-2012 - borderline transient ischemic dilatation and non-transmural inferior and inferolateral scarring,perhaps with minimal superimposed ischemia   Surgical History:  Past Surgical History  Procedure Laterality Date  . Right nephrectomy  2001    Renal cell carcinoma  . Colonoscopy  01/24/2011    prominent vascular pattern, suboptimal prep but doable  . Esophagogastroduodenoscopy  01/24/2011    mild erosive reflux esophagitis, bulbar/antral erosions, bx from antrum benign  . Av fistula repair  2008     revision of anastomosis of Right AVF  . Arteriovenous graft placement  2006    Thrombectomy and interposition jump graft revision to higher axillary vein of LUA AVG  . Coronary artery bypass graft  1998    X4 VESSELS  . Multiple cysto/ resection tumor's with bx's  LAST ONE 05-09-2011  . Multiple surg's / interventions for avgg/ fistula right upper arm  LAST REVISION 06-29-2011    (JUNE 4782 [place] VEIN/ [id number] DILATATION ANGIOPLASTY)  . Left ptc left renal artery    . Cardiac catheterization  2004  . Cataract extraction w/ intraocular lens  implant, bilateral    . Cystoscopy w/ retrogrades  12/28/2011    Procedure: CYSTOSCOPY WITH RETROGRADE PYELOGRAM;  Surgeon: Malka So, MD;  Location: [facility];  Service: Urology;  Laterality: Left;  C-ARM   . Transurethral resection of bladder tumor  12/28/2011    Procedure: TRANSURETHRAL RESECTION OF BLADDER TUMOR (TURBT);  Surgeon: Malka So, MD;  Location: [facility];  Service: Urology;  Laterality: N/A;  . Av fistula placement  02/29/2012    Procedure: INSERTION OF ARTERIOVENOUS (AV) GORE-TEX GRAFT THIGH;  Surgeon: Rosetta Posner, MD;  Location: [place];  Service: Vascular;  Laterality: Right;  Insertion right femoral arteriovenous gortex graft  . Ligation of right braciocephalic av fistula  [id number]  . Insertion of dialysis catheter  06/05/2012    Procedure: INSERTION OF DIALYSIS CATHETER;  Surgeon: Mal Misty, MD;  Location: [place];  Service: Vascular;  Laterality: Left;  Insertion diatek catheter left IJ  . Insertion of dialysis catheter  08/22/2012    Procedure: INSERTION OF DIALYSIS CATHETER;  Surgeon: Serafina Mitchell, MD;  Location: [facility];  Service: Vascular;  Laterality: Left;  insertion of dialysis catheter left  internal jugular 27cm  . Av fistula placement  10/08/2012    Procedure: INSERTION OF ARTERIOVENOUS (AV) GORE-TEX GRAFT THIGH;  Surgeon: Angelia Mould, MD;  Location: [place];   Service: Vascular;  Laterality: Left;  . Cystoscopy with biopsy N/A 08/07/2013    Procedure: CYSTOSCOPY WITH BLADDER BIOPSY;  Surgeon: Malka So, MD;  Location: [facility];  Service: Urology;  Laterality: N/A;  . Fulguration of bladder tumor N/A 08/07/2013    Procedure: FULGURATION OF BLADDER TUMOR;  Surgeon: Malka So, MD;  Location: [facility];  Service: Urology;  Laterality: N/A;  . Revision of arteriovenous goretex graft Left 08/27/2014    Procedure: THROMBECTOMY & REVISION OF LEFT ARM  ARTERIOVENOUS GORETEX GRAFT;  Surgeon: Angelia Mould, MD;  Location: [place];  Service: Vascular;  Laterality: Left;  . Revision of arteriovenous goretex graft Left 10/01/2014    Procedure: SEGMENT OF Left THIGH ARTERIOVENOUS GORETEX GRAFT Removed;  Surgeon: Mal Misty, MD;  Location: [facility];  Service: Vascular;  Laterality: Left;     Home Meds: Prior to Admission  medications   Medication Sig Start Date End Date Taking? Authorizing Provider  acetaminophen (TYLENOL) 500 MG tablet Take 500 mg by mouth every 6 (six) hours as needed. For pain/headache   Yes Historical Provider, MD  albuterol (PROVENTIL HFA;VENTOLIN HFA) 108 (90 BASE) MCG/ACT inhaler Inhale 2 puffs into the lungs every 6 (six) hours as needed for wheezing or shortness of breath. 06/23/13  Yes Lendon Colonel, NP  allopurinol (ZYLOPRIM) 100 MG tablet Take 100 mg by mouth 2 (two) times daily.  09/19/11  Yes [name], MD  aspirin EC 81 MG tablet Take 81 mg by mouth at bedtime.    Yes Historical Provider, MD  atorvastatin (LIPITOR) 10 MG tablet Take 10 mg by mouth every morning.    Yes Historical Provider, MD  B Complex-C-Folic Acid (DIALYVITE TABLET) TABS Take 1 tablet by mouth at bedtime.  05/24/12  Yes Historical Provider, MD  calcium carbonate (TUMS - DOSED IN MG ELEMENTAL CALCIUM) 500 MG chewable tablet Chew 1 tablet by mouth daily as needed for heartburn. For heartburn   Yes Historical Provider, MD  cinacalcet (SENSIPAR) 30 MG  tablet Take 30 mg by mouth daily with breakfast.    Yes Historical Provider, MD  colchicine 0.6 MG tablet Take 0.6 mg by mouth 2 (two) times daily as needed (for gout).   Yes Historical Provider, MD  docusate sodium (COLACE) 100 MG capsule Take 100 mg by mouth daily as needed for mild constipation.    Yes Historical Provider, MD  econazole nitrate 1 % cream Apply 1 application topically daily as needed (Athletes foot).   Yes Historical Provider, MD  Emollient (LUBRIDERM SERIOUSLY SENSITIVE) LOTN Apply 1 application topically daily.   Yes Historical Provider, MD  gabapentin (NEURONTIN) 100 MG capsule Take 100-200 mg by mouth 2 (two) times daily. Takes 1 in the morning and 2 at night   Yes Historical Provider, MD  lenalidomide (REVLIMID) 5 MG capsule Take 5 mg by mouth See admin instructions. When neutrophils are above 1. Takes once weekly prn   Yes Historical Provider, MD  midodrine (PROAMATINE) 10 MG tablet Take 10-20 mg by mouth See admin instructions. On Dialysis days (M,W,F), patient takes 2 tabs in the morning and 1 in the evening and on the other days patient takes 1 tablet in the morning and 2 in teh evening   Yes Historical Provider, MD  oxyCODONE-acetaminophen (PERCOCET/ROXICET) 5-325 MG per tablet Take 1 tablet by mouth every 6 (six) hours as needed. 10/01/14  Yes Alvia Grove, PA-C  polyethylene glycol powder (MIRALAX) powder Take 17 g by mouth at bedtime.    Yes Historical Provider, MD  Pramoxine-Menthol (GOLD BOND MAXIMUM RELIEF EX) Apply 1 application topically daily as needed (Itching).   Yes Historical Provider, MD  sevelamer (RENVELA) 800 MG tablet Take 1,600-2,400 mg by mouth See admin instructions. Take 3 tabs with each meals and 2 tabs with snacks   Yes Historical Provider, MD  Tbo-Filgrastim (GRANIX) 480 MCG/0.8ML SOSY injection Inject 480 mcg into the skin once a week. Tuesdays   Yes Historical Provider, MD    Allergies: No Known Allergies  History   Social History  .  Marital Status: Widowed    Spouse Name: N/A    Number of Children: 3  . Years of Education: N/A   Occupational History  . Retired    Social History Main Topics  . Smoking status: Former Smoker -- 1.00 packs/day for 25 years    Types: Cigarettes  Quit date: 02/26/1984  . Smokeless tobacco: Former Systems developer     Comment: quit 1985  . Alcohol Use: No  . Drug Use: No  . Sexual Activity: Not Currently   Other Topics Concern  . Not on file   Social History Narrative   Lives in [place] by himself.  Family near by.     Family History  Problem Relation Age of Onset  . Colon cancer Neg Hx   . Liver disease Neg Hx   . Anesthesia problems Neg Hx   . Heart disease Father   . Other Mother     died @ 71 with respiratory issues following hip fx  . Diabetes Mother     Review of Systems: General: negative for chills, fever, night sweats or weight changes.  Cardiovascular: negative for edema, orthopnea, palpitations, paroxysmal nocturnal dyspnea Dermatological: negative for rash Respiratory: non productive cough and intermittent wheeze with exertion Urologic: negative for hematuria Abdominal: negative for nausea, vomiting, diarrhea, bright red blood per rectum, melena, or hematemesis Neurologic: negative for visual changes, syncope All other systems reviewed and are otherwise negative except as noted above.  Labs:   Lab Results  Component Value Date   WBC 5.5 10/23/2014   HGB 8.0* 10/23/2014   HCT 25.5* 10/23/2014   MCV 113.3* 10/23/2014   PLT 107* 10/23/2014     Recent Labs Lab 10/23/14 1116  NA 138  K 6.3*  CL 97  CO2 23  BUN 62*  CREATININE 8.30*  CALCIUM 8.7  GLUCOSE 96    Recent Labs  10/23/14 1120  TROPONINI <0.30    Radiology/Studies:  Dg Chest Port 1 View 10/23/2014   CLINICAL DATA:  80 year old with shortness of breath and cough  EXAM: PORTABLE CHEST - 1 VIEW  COMPARISON:  04/21/2014  FINDINGS: Median sternotomy wires, surgical clips and CABG markers  are present. Metallic clips project in the left axilla. A vascular stent projects over the right shoulder.  The cardiac silhouette is enlarged. The mediastinal contours are within normal limits. Atherosclerotic aortic calcifications are present.  Interstitial prominence is present in the lung bases, right slightly greater than left. There is no pleural effusion on the right. The left costophrenic angle is excluded from the field of view, limiting evaluation. There is no pneumothorax.  No acute osseous abnormality is identified.  IMPRESSION: 1. Mild cardiomegaly with probable mild interstitial pulmonary edema. Interstitial pneumonitis could have a similar appearance. Clinical correlation is recommended. 2. Prior CABG.   Electronically Signed   By: Rosemarie Ax   On: 10/23/2014 12:09    EKG: sinus rhythm, rate 86, non specific ST-T changes laterally - slightly more prominent than previously  Physical Exam: Blood pressure 123/66, pulse 79, temperature 97.5 F (36.4 C), temperature source Oral, resp. rate 24, SpO2 95 %. General: Chronically ill appearing, well nourished WM, in no acute distress. Head: Normocephalic, atraumatic, sclera non-icteric, no xanthomas, nares are without discharge.  Neck: + bilateral carotid bruits. + JVD to jaw Lungs: Decreased breath sounds in the bases, slightly tachypneic  Heart: RRR with S1 S2. 2/6 SEM S2 only slightly reduced Abdomen: Soft, diffuse tenderness, non-distended with normoactive bowel sounds. No hepatomegaly. No rebound/guarding. No obvious abdominal masses. Msk:  Age appropriate muscle atrophy Extremities: No clubbing or cyanosis. 2+ edema.  DP/PT present but decreased.  Left upper thigh fistula with bruit Neuro: Alert and oriented X 3. No focal deficit. No facial asymmetry. Moves all extremities spontaneously. Psych:  Responds to questions appropriately with a normal  affect.    ASSESSMENT AND PLAN:  1.  Unstable angina Pt with known CAD and is s/p CABG  1998 Repeat echo Continue to cycle enzymes Continue ASA/statin Beta blocker has been discontinued 2/2 hypotension Will heparinize until he completes r/o MI  2.  Aortic stenosis Moderate by echo 2014 Repeat echo  3.  ESRD On HD Management per nephrology  4. Anemia/Myelodysplastic syndrome with 5q depletion - followed by heme/onc  Management per primary team  Signed, Andrew Marshall, NP-S 10/23/2014 2:24 PM  Patient seen and examined with Andrew Marshall, NP-s We discussed all aspects of the encounter. I agree with the assessment and plan as stated above.   CP very concerning for [place]. However this may be exacerbated by anemia and significant volume overload as well as AS. He has been struggling with hypotension on HD.  We have discussed with him and Dr. Lorrene Reid and feel best plan is to proceed with cardiac cath on Monday once he has been dialyzed and transfused. Will also need abx for L groin infection.  Will check echo to further evaluate - suspect moderate by exam. Would heparinize at least until he completes rule out MI. Treat ASA, heparin, statin. No b-blocker due to low BP.   Daniel Bensimhon,MD 3:22 PM

## 2014-10-23 NOTE — Progress Notes (Signed)
Report called to 2W

## 2014-10-23 NOTE — Consult Note (Signed)
[name] Renal Consultation Note    Indication for Consultation:  Management of ESRD/hemodialysis; anemia, hypertension/volume and secondary hyperparathyroidism PCP: Dr. Gerarda Fraction  HPI: Andrew [name] is a 80 y.o. male with ESRD(MWF [place]) secondary to PCKD with hx chronic hypotension on midodrine, mod AS, PVD, hx CVA, recurrent Ca of the bladder, CAD hx CABG, myelodysplastic syndrome who presented to the ED with chest pain.  The patient hasn't felt well since his last dialysis Tuesday. He didn't eat well yesterday and asked to come to the hospital, which is totally out of character per his daughter. He had midsternal CP with exertion and DOE which is worse when his Hgb drops.  He's had no N, V, D, fever or chills.   He has had multiple vascular access issues over the years and had revision of the arterial side of this thigh graft 10/1.  Wound culture 10/13 grew enterobacter/MRSA which was treated with Vanc and Fortaz through 10/30.  Subsequent cultures 10/30 and 11/13 were + for MRSA, enterococcus faecalis, bacteroides fragilis and Veillonella. He received Vancomycin 10/30 - 11/4 and then 11/16 through 11/23. He denied significant pain in left thigh.     Past Medical History  Diagnosis Date  . ESRD on hemodialysis     Hemodialysis since 2003; Occluded access in both upper extremities and the right lower extremity, current access as of Aug 2014 is L thigh AVG. Gets HD MWF at [facility].     . Duodenal ulcer     remote  . Chronic combined systolic and diastolic CHF (congestive heart failure)     a. 02/2012 Echo: EF 65%, basal infolat AK, mild conc LVH;  b. 06/2013 Echo: EF 40-45%, mild LVH, Gr2 DD, basal inf HK->AK, Mod AS, Mild MR, PASP 73  . Arteriosclerotic cardiovascular disease (ASCVD) 1998    a. [id number]; b. 06/2003 Cath: LM 70d, LAD 70ost, 80p, LCX 70p/126m, RCA 80p/154m, LIMA->LAD patent, VG->OM2->OM3 patent, VG->dRCA patent, EF 55%;  c. 02/2012 Lexi MV: inf/inflat  scarring w/ ? minimal peri-infarct ischemia.  . Cerebrovascular disease   . Neuropathy of foot   . Degenerative joint disease   . Gastroesophageal reflux disease   . Impaired hearing     Bilateral; hearing aids relatively ineffective  . Hypertension   . Cholelithiasis 03/2011    Diagnosed incidentally on abdominal ultrasound in 03/2011  . Hepatic steatosis   . Macrocytosis 10/20/2012  . Thrombocytopenia 10/20/2012  . Hyperlipidemia   . Secondary hyperparathyroidism   . Peripheral vascular disease   . Gout   . Moderate aortic stenosis     a. 06/2013 Echo: Mod AS, mean grad: 25, peak grad: 57.  . Pulmonary hypertension     a. 06/2013 Echo: PASP 53mmHg.  Marland Kitchen Anemia     Prior blood transfusion  . Complication of anesthesia     daughter reports long episodes confusion after amnesia meds  . Renal cell carcinoma 2001    s/p right nephrectomy-2001; subsequent ESRD  . 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 Hypotension   . Pancytopenia   . MRSA infection    Past Surgical History  Procedure Laterality Date  . Right nephrectomy  2001    Renal cell carcinoma  . Colonoscopy  01/24/2011    prominent vascular pattern, suboptimal prep but doable  . Esophagogastroduodenoscopy  01/24/2011    mild erosive reflux esophagitis, bulbar/antral erosions, bx from antrum benign  . Av fistula repair  2008    revision of anastomosis of  Right AVF  . Arteriovenous graft placement  2006    Thrombectomy and interposition jump graft revision to higher axillary vein of LUA AVG  . Coronary artery bypass graft  1998    X4 VESSELS  . Multiple cysto/ resection tumor's with bx's  LAST ONE 05-09-2011  . Multiple surg's / interventions for avgg/ fistula right upper arm  LAST REVISION 06-29-2011    (JUNE [address] VEIN/ [id number] DILATATION ANGIOPLASTY)  . Left ptc left renal artery    . Cardiac catheterization  2004  . Cataract extraction w/ intraocular lens  implant, bilateral    .  Cystoscopy w/ retrogrades  12/28/2011    Procedure: CYSTOSCOPY WITH RETROGRADE PYELOGRAM;  Surgeon: Malka So, MD;  Location: [facility];  Service: Urology;  Laterality: Left;  C-ARM   . Transurethral resection of bladder tumor  12/28/2011    Procedure: TRANSURETHRAL RESECTION OF BLADDER TUMOR (TURBT);  Surgeon: Malka So, MD;  Location[facility];  Service: Urology;  Laterality: N/A;  . Av fistula placement  02/29/2012    Procedure: INSERTION OF ARTERIOVENOUS (AV) GORE-TEX GRAFT THIGH;  Surgeon: Rosetta Posner, MD;  Location: [place];  Service: Vascular;  Laterality: Right;  Insertion right femoral arteriovenous gortex graft  . Ligation of right braciocephalic av fistula  [id number]  . Insertion of dialysis catheter  06/05/2012    Procedure: INSERTION OF DIALYSIS CATHETER;  Surgeon: Mal Misty, MD;  Location: [place];  Service: Vascular;  Laterality: Left;  Insertion diatek catheter left IJ  . Insertion of dialysis catheter  08/22/2012    Procedure: INSERTION OF DIALYSIS CATHETER;  Surgeon: Serafina Mitchell, MD;  Location: [facility];  Service: Vascular;  Laterality: Left;  insertion of dialysis catheter left  internal jugular 27cm  . Av fistula placement  10/08/2012    Procedure: INSERTION OF ARTERIOVENOUS (AV) GORE-TEX GRAFT THIGH;  Surgeon: Angelia Mould, MD;  Location: [place];  Service: Vascular;  Laterality: Left;  . Cystoscopy with biopsy N/A 08/07/2013    Procedure: CYSTOSCOPY WITH BLADDER BIOPSY;  Surgeon: Malka So, MD;  Location: [facility];  Service: Urology;  Laterality: N/A;  . Fulguration of bladder tumor N/A 08/07/2013    Procedure: FULGURATION OF BLADDER TUMOR;  Surgeon: Malka So, MD;  Location: [facility];  Service: Urology;  Laterality: N/A;  . Revision of arteriovenous goretex graft Left 08/27/2014    Procedure: THROMBECTOMY & REVISION OF LEFT ARM  ARTERIOVENOUS GORETEX GRAFT;  Surgeon: Angelia Mould, MD;  Location: [place];  Service:  Vascular;  Laterality: Left;  . Revision of arteriovenous goretex graft Left 10/01/2014    Procedure: SEGMENT OF Left THIGH ARTERIOVENOUS GORETEX GRAFT Removed;  Surgeon: Mal Misty, MD;  Location[facility];  Service: Vascular;  Laterality: Left;   Family History  Problem Relation Age of Onset  . Colon cancer Neg Hx   . Liver disease Neg Hx   . Anesthesia problems Neg Hx   . Heart disease Father   . Other Mother     died @ 68 with respiratory issues following hip fx  . Diabetes Mother    Social History:  reports that he quit smoking about 30 years ago. His smoking use included Cigarettes. He has a 25 pack-year smoking history. He has quit using smokeless tobacco. He reports that he does not drink alcohol or use illicit drugs. No Known Allergies Prior to Admission medications   Medication Sig Start Date  End Date Taking? Authorizing Provider  acetaminophen (TYLENOL) 500 MG tablet Take 500 mg by mouth every 6 (six) hours as needed. For pain/headache   Yes Historical Provider, MD  albuterol (PROVENTIL HFA;VENTOLIN HFA) 108 (90 BASE) MCG/ACT inhaler Inhale 2 puffs into the lungs every 6 (six) hours as needed for wheezing or shortness of breath. 06/23/13  Yes Lendon Colonel, NP  allopurinol (ZYLOPRIM) 100 MG tablet Take 100 mg by mouth 2 (two) times daily.  09/19/11  Yes [name], MD  aspirin EC 81 MG tablet Take 81 mg by mouth at bedtime.    Yes Historical Provider, MD  atorvastatin (LIPITOR) 10 MG tablet Take 10 mg by mouth every morning.    Yes Historical Provider, MD  B Complex-C-Folic Acid (DIALYVITE TABLET) TABS Take 1 tablet by mouth at bedtime.  05/24/12  Yes Historical Provider, MD  calcium carbonate (TUMS - DOSED IN MG ELEMENTAL CALCIUM) 500 MG chewable tablet Chew 1 tablet by mouth daily as needed for heartburn. For heartburn   Yes Historical Provider, MD  cinacalcet (SENSIPAR) 30 MG tablet Take 30 mg by mouth daily with breakfast.    Yes Historical Provider, MD   colchicine 0.6 MG tablet Take 0.6 mg by mouth 2 (two) times daily as needed (for gout).   Yes Historical Provider, MD  docusate sodium (COLACE) 100 MG capsule Take 100 mg by mouth daily as needed for mild constipation.    Yes Historical Provider, MD  econazole nitrate 1 % cream Apply 1 application topically daily as needed (Athletes foot).   Yes Historical Provider, MD  Emollient (LUBRIDERM SERIOUSLY SENSITIVE) LOTN Apply 1 application topically daily.   Yes Historical Provider, MD  gabapentin (NEURONTIN) 100 MG capsule Take 100-200 mg by mouth 2 (two) times daily. Takes 1 in the morning and 2 at night   Yes Historical Provider, MD  lenalidomide (REVLIMID) 5 MG capsule Take 5 mg by mouth See admin instructions. When neutrophils are above 1. Takes once weekly prn   Yes Historical Provider, MD  midodrine (PROAMATINE) 10 MG tablet Take 10-20 mg by mouth See admin instructions. On Dialysis days (M,W,F), patient takes 2 tabs in the morning and 1 in the evening and on the other days patient takes 1 tablet in the morning and 2 in teh evening   Yes Historical Provider, MD  oxyCODONE-acetaminophen (PERCOCET/ROXICET) 5-325 MG per tablet Take 1 tablet by mouth every 6 (six) hours as needed. 10/01/14  Yes Alvia Grove, PA-C  polyethylene glycol powder (MIRALAX) powder Take 17 g by mouth at bedtime.    Yes Historical Provider, MD  Pramoxine-Menthol (GOLD BOND MAXIMUM RELIEF EX) Apply 1 application topically daily as needed (Itching).   Yes Historical Provider, MD  sevelamer (RENVELA) 800 MG tablet Take 1,600-2,400 mg by mouth See admin instructions. Take 3 tabs with each meals and 2 tabs with snacks   Yes Historical Provider, MD  Tbo-Filgrastim (GRANIX) 480 MCG/0.8ML SOSY injection Inject 480 mcg into the skin once a week. Tuesdays   Yes Historical Provider, MD   Current Facility-Administered Medications  Medication Dose Route Frequency Provider Last Rate Last Dose  . 0.9 %  sodium chloride infusion  100 mL  Intravenous PRN Myriam Jacobson, PA-C      . 0.9 %  sodium chloride infusion  100 mL Intravenous PRN Myriam Jacobson, PA-C      . 0.9 %  sodium chloride infusion   Intravenous Once Myriam Jacobson, PA-C      .  alteplase (CATHFLO ACTIVASE) injection 2 mg  2 mg Intracatheter Once PRN Myriam Jacobson, PA-C      . Derrill Memo ON 10/26/2014] cefTAZidime (FORTAZ) 2 g in dextrose 5 % 50 mL IVPB  2 g Intravenous Q M,W,F-HD Jamal Maes, MD      . Derrill Memo ON 10/26/2014] Darbepoetin Alfa (ARANESP) injection 200 mcg  200 mcg Intravenous Q Mon-HD Myriam Jacobson, PA-C      . Derrill Memo ON 10/26/2014] doxercalciferol (HECTOROL) injection 3 mcg  3 mcg Intravenous Q M,W,F-HD Myriam Jacobson, PA-C      . feeding supplement (NEPRO CARB STEADY) liquid 237 mL  237 mL Oral PRN Myriam Jacobson, PA-C      . heparin injection 1,000 Units  1,000 Units Dialysis PRN Myriam Jacobson, PA-C      . heparin injection 1,600 Units  20 Units/kg Dialysis PRN Myriam Jacobson, PA-C      . lidocaine (PF) (XYLOCAINE) 1 % injection 5 mL  5 mL Intradermal PRN Myriam Jacobson, PA-C      . lidocaine-prilocaine (EMLA) cream 1 application  1 application Topical PRN Myriam Jacobson, PA-C      . midodrine (PROAMATINE) tablet 10 mg  10 mg Oral TID WC Jamal Maes, MD   10 mg at 10/23/14 1436  . pentafluoroprop-tetrafluoroeth (GEBAUERS) aerosol 1 application  1 application Topical PRN Myriam Jacobson, PA-C       Current Outpatient Prescriptions  Medication Sig Dispense Refill  . acetaminophen (TYLENOL) 500 MG tablet Take 500 mg by mouth every 6 (six) hours as needed. For pain/headache    . albuterol (PROVENTIL HFA;VENTOLIN HFA) 108 (90 BASE) MCG/ACT inhaler Inhale 2 puffs into the lungs every 6 (six) hours as needed for wheezing or shortness of breath. 1 Inhaler 0  . allopurinol (ZYLOPRIM) 100 MG tablet Take 100 mg by mouth 2 (two) times daily.     Marland Kitchen aspirin EC 81 MG tablet Take 81 mg by mouth at bedtime.     Marland Kitchen atorvastatin  (LIPITOR) 10 MG tablet Take 10 mg by mouth every morning.     . B Complex-C-Folic Acid (DIALYVITE TABLET) TABS Take 1 tablet by mouth at bedtime.     . calcium carbonate (TUMS - DOSED IN MG ELEMENTAL CALCIUM) 500 MG chewable tablet Chew 1 tablet by mouth daily as needed for heartburn. For heartburn    . cinacalcet (SENSIPAR) 30 MG tablet Take 30 mg by mouth daily with breakfast.     . colchicine 0.6 MG tablet Take 0.6 mg by mouth 2 (two) times daily as needed (for gout).    Marland Kitchen docusate sodium (COLACE) 100 MG capsule Take 100 mg by mouth daily as needed for mild constipation.     Marland Kitchen econazole nitrate 1 % cream Apply 1 application topically daily as needed (Athletes foot).    . Emollient (LUBRIDERM SERIOUSLY SENSITIVE) LOTN Apply 1 application topically daily.    Marland Kitchen gabapentin (NEURONTIN) 100 MG capsule Take 100-200 mg by mouth 2 (two) times daily. Takes 1 in the morning and 2 at night    . lenalidomide (REVLIMID) 5 MG capsule Take 5 mg by mouth See admin instructions. When neutrophils are above 1. Takes once weekly prn    . midodrine (PROAMATINE) 10 MG tablet Take 10-20 mg by mouth See admin instructions. On Dialysis days (M,W,F), patient takes 2 tabs in the morning and 1 in the evening and on the other days patient takes 1 tablet in the morning and  2 in teh evening    . oxyCODONE-acetaminophen (PERCOCET/ROXICET) 5-325 MG per tablet Take 1 tablet by mouth every 6 (six) hours as needed. 20 tablet 0  . polyethylene glycol powder (MIRALAX) powder Take 17 g by mouth at bedtime.     . Pramoxine-Menthol (GOLD BOND MAXIMUM RELIEF EX) Apply 1 application topically daily as needed (Itching).    . sevelamer (RENVELA) 800 MG tablet Take 1,600-2,400 mg by mouth See admin instructions. Take 3 tabs with each meals and 2 tabs with snacks    . Tbo-Filgrastim (GRANIX) 480 MCG/0.8ML SOSY injection Inject 480 mcg into the skin once a week. Tuesdays     Facility-Administered Medications Ordered in Other Encounters   Medication Dose Route Frequency Provider Last Rate Last Dose  . methylPREDNISolone sodium succinate (SOLU-MEDROL) 125 mg/2 mL injection 60 mg  60 mg Intravenous Once Sol Blazing, MD       Labs: Basic Metabolic Panel:  Recent Labs Lab 10/23/14 1116  NA 138  K 6.3*  CL 97  CO2 23  GLUCOSE 96  BUN 62*  CREATININE 8.30*  CALCIUM 8.7   CBC:  Recent Labs Lab 10/20/14 1052 10/23/14 1116  WBC 1.7* 5.5  NEUTROABS 0.4*  --   HGB 8.2* 8.0*  HCT 25.8* 25.5*  MCV 112.9* 113.3*  PLT 82* 107*   Cardiac Enzymes:  Recent Labs Lab 10/23/14 1120  TROPONINI <0.30   Studies/Results: Dg Chest Port 1 View  10/23/2014   CLINICAL DATA:  79 year old with shortness of breath and cough  EXAM: PORTABLE CHEST - 1 VIEW  COMPARISON:  04/21/2014  FINDINGS: Median sternotomy wires, surgical clips and CABG markers are present. Metallic clips project in the left axilla. A vascular stent projects over the right shoulder.  The cardiac silhouette is enlarged. The mediastinal contours are within normal limits. Atherosclerotic aortic calcifications are present.  Interstitial prominence is present in the lung bases, right slightly greater than left. There is no pleural effusion on the right. The left costophrenic angle is excluded from the field of view, limiting evaluation. There is no pneumothorax.  No acute osseous abnormality is identified.  IMPRESSION: 1. Mild cardiomegaly with probable mild interstitial pulmonary edema. Interstitial pneumonitis could have a similar appearance. Clinical correlation is recommended. 2. Prior CABG.   Electronically Signed   By: Rosemarie Ax   On: 10/23/2014 12:09    ROS: As per HPI otherwise negative.  Physical Exam: Filed Vitals:   10/23/14 1300 10/23/14 1330 10/23/14 1345 10/23/14 1445  BP: 146/54 156/71 123/66 138/68  Pulse: 77 75 79 80  Temp:    97.7 F (36.5 C)  TempSrc:    Oral  Resp: $Remo'18 22 24 22  'cvtji$ SpO2: 99% 96% 95% 95%     General:  Chronically ill  appearing WM, but NAD  Head: Normocephalic, atraumatic, sclera non-icteric, mucus membranes are moist Neck: Supple.  +JVD Lungs:  Dim BS Heart: RRR 2/6 murmur. Abdomen: Soft, non-tender, non-distended with normoactive bowel sounds. M-S:  Strength and tone appear normal for age. Lower extremities:  Tr LE edema or ischemic changes, no open wounds, fungal nails Neuro: Alert and oriented X 3. Moves all extremities spontaneously.HOH Psych:  Responds to questions appropriately with a normal affect. Dialysis Access: left thigh graft patent lateral to old graft - wound medial to left lateral side of graft with two holes draining purulent material that can be expressed by proximal milking of wound  Dialysis Orders: Center: [facility]  160 2 K 3.5 Ca EDW  77.5 left thigh graft Hectorol 3 Aranesp 200 ^ from 160 to start 12/2 no Fe, heparin 2000 Recent labs: Hgb 7.9 11/24, 8.1 11/18, 9.1 11/11 9.9 11/4, 10.5 10/28 and 10.9 10/21---tsat 38% 11/18 iPTH 308 10/21 - Ca and P wnl  Assessment/Plan: 1. Chest pain/tightness hx CAD/CABG/mod AS - CXR showed pul edema;- last Echo 2014- cards planning cath on Monday, cycle enzymes, repeat Echo/continue medical treatment 2. 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  Pt had left thigh graft revised 10/1 and reroutened the arterial half of the loop and on 11/5 a short segment of the infected exposed nonfunctional AVGG materal was removed.  When seen by Dr. Kellie Simmering 11/24, there was no drainage.  Today, the wound is draining purulent material from two holes. Dr. Bridgett Larsson consulted to see. Pharmacy to dose Vancomycin and Tressie Ellis will also be given  3. ESRD -  MWF -  with hyperkalemia K 6.3 - last HD was 11/24 and only ran 3 hr and 19 min but delivered kt/v 1.55 4. Chronic hypotension /volume  - on midodrine 10 tid for BP support - always given pre HD 5. Anemia  - Hgb has been trending down since October - could be related to chronic infected graft, lower Aranesp dose, meyelodysplasitc syndrome; Aranesp increased from 120 to 160 11/18 and 200 for 12/2 - received 160 this week; last tsat 38% 11/18- followed by Dr. Alvy Bimler with guidelines to transfuse when Hgb < 8; plan transfuse 1 unit PRBC today due to chest pain/sob more DOE and feeling bad overall- may be contributory 6. Metabolic bone disease - has been on 3.5 Ca bath, sensipar, renvela, Hectorol with last iPTH of 308 in October Ca/P controlled - not clear why on added Ca bath - plan 2.5 Ca bath today and follow while in hospital 7. Nutrition - no appetite the past few days - renal diet + supplements/vit 8. MDS - profound pancytopenia with Revlimid- on hold now; on weekly Granix; Dr. Alvy Bimler following 9. Hx CVA 10. Hx recurrent bladder cancer  Myriam Jacobson, PA-C [facility] Beeper [phone] 10/23/2014, 3:06 PM   I have seen and examined this patient and agree with plan and assessment in the above note.  Dialysis, vol removal, transfuse 1 unit, correct K, cultures w/vanco and fortaz for grossly purulent material draining from old non-fx graft revision site, VVS consult, heart cath on Monday after the above issues tuned up a bit. Naika Noto B,MD 10/23/2014 6:35 PM

## 2014-10-23 NOTE — ED Provider Notes (Signed)
CSN: [id number]     Arrival date & time 10/23/14  1046 History   First MD Initiated Contact with Patient 10/23/14 1123     Chief Complaint  Patient presents with  . Chest Pain     (Consider location/radiation/quality/duration/timing/severity/associated sxs/prior Treatment) HPI Complains of anterior nonradiating chest pain onset 3 days ago accompanied by shortness of breath. Pain is brought on by walking resolves with rest he is presently pain-free he's had multiple episodes for the past 3 days feels like "heart pain" he's had in the past. He does presently admits to dyspnea he denies nausea or vomiting no other associated symptoms. He treated himself with aspirin 162 mg by mouth at 2 AM today. Past Medical History  Diagnosis Date  . ESRD on hemodialysis     Hemodialysis since 2003; Occluded access in both upper extremities and the right lower extremity, current access as of Aug 2014 is L thigh AVG. Gets HD MWF at [facility].     . Duodenal ulcer     remote  . Chronic combined systolic and diastolic CHF (congestive heart failure)     a. 02/2012 Echo: EF 65%, basal infolat AK, mild conc LVH;  b. 06/2013 Echo: EF 40-45%, mild LVH, Gr2 DD, basal inf HK->AK, Mod AS, Mild MR, PASP 73  . Arteriosclerotic cardiovascular disease (ASCVD) 1998    a. [id number]; b. 06/2003 Cath: LM 70d, LAD 70ost, 80p, LCX 70p/173m, RCA 80p/119m, LIMA->LAD patent, VG->OM2->OM3 patent, VG->dRCA patent, EF 55%;  c. 02/2012 Lexi MV: inf/inflat scarring w/ ? minimal peri-infarct ischemia.  . Cerebrovascular disease   . Neuropathy of foot   . Degenerative joint disease   . Gastroesophageal reflux disease   . Impaired hearing     Bilateral; hearing aids relatively ineffective  . Hypertension   . Cholelithiasis 03/2011    Diagnosed incidentally on abdominal ultrasound in 03/2011  . Hepatic steatosis   . Macrocytosis 10/20/2012  . Thrombocytopenia 10/20/2012  . Gallstones   . Hyperlipidemia   . Secondary  hyperparathyroidism   . Peripheral vascular disease   . Gout   . Moderate aortic stenosis     a. 06/2013 Echo: Mod AS, mean grad: 25, peak grad: 57.  . Pulmonary hypertension     a. 06/2013 Echo: PASP 16mmHg.  Marland Kitchen Anemia     Prior blood transfusion  . History of blood transfusion     last infusion 07/31/13  . Complication of anesthesia     daughter reports long episodes confusion after amnesia meds  . History of bladder cancer   . Renal cell carcinoma 2001    s/p right nephrectomy-2001; subsequent ESRD  . 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 Hypotension   . Pancytopenia   . Shortness of breath dyspnea     with exertion  . MRSA infection    Past Surgical History  Procedure Laterality Date  . Right nephrectomy  2001    Renal cell carcinoma  . Colonoscopy  01/24/2011    prominent vascular pattern, suboptimal prep but doable  . Esophagogastroduodenoscopy  01/24/2011    mild erosive reflux esophagitis, bulbar/antral erosions, bx from antrum benign  . Av fistula repair  2008    revision of anastomosis of Right AVF  . Arteriovenous graft placement  2006    Thrombectomy and interposition jump graft revision to higher axillary vein of LUA AVG  . Coronary artery bypass graft  1998    X4 VESSELS  . Multiple cysto/ resection tumor's with bx's  LAST ONE 05-09-2011  . Multiple surg's / interventions for avgg/ fistula right upper arm  LAST REVISION 06-29-2011    (JUNE 1950 [place] VEIN/ [id number] DILATATION ANGIOPLASTY)  . Left ptc left renal artery    . Cardiac catheterization  2004  . Cataract extraction w/ intraocular lens  implant, bilateral    . Cystoscopy w/ retrogrades  12/28/2011    Procedure: CYSTOSCOPY WITH RETROGRADE PYELOGRAM;  Surgeon: Malka So, MD;  Location: [facility];  Service: Urology;  Laterality: Left;  C-ARM   . Transurethral resection of bladder tumor  12/28/2011    Procedure: TRANSURETHRAL RESECTION OF BLADDER TUMOR  (TURBT);  Surgeon: Malka So, MD;  Location[facility];  Service: Urology;  Laterality: N/A;  . Av fistula placement  02/29/2012    Procedure: INSERTION OF ARTERIOVENOUS (AV) GORE-TEX GRAFT THIGH;  Surgeon: Rosetta Posner, MD;  Location: [place];  Service: Vascular;  Laterality: Right;  Insertion right femoral arteriovenous gortex graft  . Ligation of right braciocephalic av fistula  [id number]  . Insertion of dialysis catheter  06/05/2012    Procedure: INSERTION OF DIALYSIS CATHETER;  Surgeon: Mal Misty, MD;  Location: [place];  Service: Vascular;  Laterality: Left;  Insertion diatek catheter left IJ  . Insertion of dialysis catheter  08/22/2012    Procedure: INSERTION OF DIALYSIS CATHETER;  Surgeon: Serafina Mitchell, MD;  Location: [facility];  Service: Vascular;  Laterality: Left;  insertion of dialysis catheter left  internal jugular 27cm  . Av fistula placement  10/08/2012    Procedure: INSERTION OF ARTERIOVENOUS (AV) GORE-TEX GRAFT THIGH;  Surgeon: Angelia Mould, MD;  Location: [place];  Service: Vascular;  Laterality: Left;  . Cystoscopy with biopsy N/A 08/07/2013    Procedure: CYSTOSCOPY WITH BLADDER BIOPSY;  Surgeon: Malka So, MD;  Location: [facility];  Service: Urology;  Laterality: N/A;  . Fulguration of bladder tumor N/A 08/07/2013    Procedure: FULGURATION OF BLADDER TUMOR;  Surgeon: Malka So, MD;  Location: [facility];  Service: Urology;  Laterality: N/A;  . Revision of arteriovenous goretex graft Left 08/27/2014    Procedure: THROMBECTOMY & REVISION OF LEFT ARM  ARTERIOVENOUS GORETEX GRAFT;  Surgeon: Angelia Mould, MD;  Location: [place];  Service: Vascular;  Laterality: Left;  . Revision of arteriovenous goretex graft Left 10/01/2014    Procedure: SEGMENT OF Left THIGH ARTERIOVENOUS GORETEX GRAFT Removed;  Surgeon: Mal Misty, MD;  Location: [facility];  Service: Vascular;  Laterality: Left;   Family History  Problem Relation Age of Onset  . Colon cancer  Neg Hx   . Liver disease Neg Hx   . Anesthesia problems Neg Hx   . Heart disease Father   . Other Mother     died @ 34 with respiratory issues following hip fx  . Diabetes Mother    History  Substance Use Topics  . Smoking status: Former Smoker -- 1.00 packs/day for 25 years    Types: Cigarettes    Quit date: 02/26/1984  . Smokeless tobacco: Former Systems developer     Comment: quit 1985  . Alcohol Use: No    Review of Systems  Respiratory: Positive for shortness of breath.   Cardiovascular: Positive for chest pain.  Genitourinary:       Makes very small amounts of urine. Last hemodialysis was 3 days ago. He is due for hemodialysis today      Allergies  Review of patient's  allergies indicates no known allergies.  Home Medications   Prior to Admission medications   Medication Sig Start Date End Date Taking? Authorizing Provider  acetaminophen (TYLENOL) 500 MG tablet Take 500 mg by mouth every 6 (six) hours as needed. For pain/headache    Historical Provider, MD  albuterol (PROVENTIL HFA;VENTOLIN HFA) 108 (90 BASE) MCG/ACT inhaler Inhale 2 puffs into the lungs every 6 (six) hours as needed for wheezing or shortness of breath. 06/23/13   Lendon Colonel, NP  allopurinol (ZYLOPRIM) 100 MG tablet Take 100 mg by mouth 2 (two) times daily.  09/19/11   [name], MD  aspirin EC 81 MG tablet Take 81 mg by mouth at bedtime.     Historical Provider, MD  atorvastatin (LIPITOR) 10 MG tablet Take 10 mg by mouth every morning.     Historical Provider, MD  B Complex-C-Folic Acid (DIALYVITE TABLET) TABS Take 1 tablet by mouth at bedtime.  05/24/12   Historical Provider, MD  calcium carbonate (TUMS - DOSED IN MG ELEMENTAL CALCIUM) 500 MG chewable tablet Chew 1 tablet by mouth daily as needed for heartburn. For heartburn    Historical Provider, MD  cinacalcet (SENSIPAR) 30 MG tablet Take 30 mg by mouth daily with breakfast.     Historical Provider, MD  colchicine 0.6 MG tablet Take 0.6 mg by  mouth 2 (two) times daily as needed (for gout).    Historical Provider, MD  docusate sodium (COLACE) 100 MG capsule Take 100 mg by mouth daily as needed for mild constipation.     Historical Provider, MD  gabapentin (NEURONTIN) 100 MG capsule Take 100-200 mg by mouth 2 (two) times daily. Takes 1 in the morning and 2 at night    Historical Provider, MD  midodrine (PROAMATINE) 10 MG tablet Take 10-20 mg by mouth See admin instructions. On Dialysis days (M,W,F), patient takes 2 tabs in the morning and 1 in the evening and on the other days patient takes 1 tablet in the morning and 2 in teh evening    Historical Provider, MD  omeprazole (PRILOSEC) 40 MG capsule Take 40 mg by mouth daily as needed. For reflux    Historical Provider, MD  oxyCODONE-acetaminophen (PERCOCET/ROXICET) 5-325 MG per tablet Take 1 tablet by mouth every 6 (six) hours as needed. 10/01/14   Alvia Grove, PA-C  polyethylene glycol powder (MIRALAX) powder Take 17 g by mouth at bedtime.     Historical Provider, MD  sevelamer (RENVELA) 800 MG tablet Take 1,600-2,400 mg by mouth See admin instructions. Take 3 tabs with each meals and 2 tabs with snacks    Historical Provider, MD   BP 128/49 mmHg  Pulse 88  Temp(Src) 97.5 F (36.4 C) (Oral)  Resp 24  SpO2 98% Physical Exam  Constitutional: He appears well-developed and well-nourished.  HENT:  Head: Normocephalic and atraumatic.  Eyes: Conjunctivae are normal. Pupils are equal, round, and reactive to light.  Neck: Neck supple. No tracheal deviation present. No thyromegaly present.  Cardiovascular: Normal rate and regular rhythm.   No murmur heard. Pulmonary/Chest: Effort normal and breath sounds normal.  Abdominal: Soft. Bowel sounds are normal. He exhibits no distension. There is no tenderness.  Musculoskeletal: Normal range of motion. He exhibits no edema or tenderness.  Dialysis graft at left thigh, with good thrill  Neurological: He is alert. Coordination normal.  Skin:  Skin is warm and dry. No rash noted.  Psychiatric: He has a normal mood and affect.  Nursing note and vitals  reviewed.   ED Course  Procedures (including critical care time) Labs Review Labs Reviewed  [place], ED    Imaging Review No results found.   EKG Interpretation None      Date: 10/23/2014  Rate: 90  Rhythm: normal sinus rhythm  QRS Axis: normal  Intervals: normal  ST/T Wave abnormalities: nonspecific T wave changes  Conduction Disutrbances:none  Narrative Interpretation:   Old EKG Reviewed: Nonspecific T wave changes in apical leads new over 08/27/2014 interpreted by me Chest x-ray viewed by me Results for orders placed or performed during the hospital encounter of 10/23/14  CBC  Result Value Ref Range   WBC 5.5 4.0 - 10.5 K/uL   RBC 2.25 (L) 4.22 - 5.81 MIL/uL   Hemoglobin 8.0 (L) 13.0 - 17.0 g/dL   HCT 25.5 (L) 39.0 - 52.0 %   MCV 113.3 (H) 78.0 - 100.0 fL   MCH 35.6 (H) 26.0 - 34.0 pg   MCHC 31.4 30.0 - 36.0 g/dL   RDW 16.8 (H) 11.5 - 15.5 %   Platelets 107 (L) 150 - 400 K/uL  Basic metabolic panel  Result Value Ref Range   Sodium 138 137 - 147 mEq/L   Potassium 6.3 (H) 3.7 - 5.3 mEq/L   Chloride 97 96 - 112 mEq/L   CO2 23 19 - 32 mEq/L   Glucose, Bld 96 70 - 99 mg/dL   BUN 62 (H) 6 - 23 mg/dL   Creatinine, Ser 8.30 (H) 0.50 - 1.35 mg/dL   Calcium 8.7 8.4 - 10.5 mg/dL   GFR calc non Af Amer 5 (L) >90 mL/min   GFR calc Af Amer 6 (L) >90 mL/min   Anion gap 18 (H) 5 - 15  BNP (order ONLY if patient complains of dyspnea/SOB AND you have documented it for THIS visit)  Result Value Ref Range   Pro B Natriuretic peptide (BNP) 30772.0 (H) 0 - 450 pg/mL  Troponin I  Result Value Ref Range   Troponin I <0.30 <0.30 ng/mL  I-stat troponin, ED (not at [facility])  Result Value Ref Range   Troponin i, poc 0.07 0.00 - 0.08 ng/mL   Comment 3           Dg Chest Port 1 View  10/23/2014   CLINICAL  DATA:  80 year old with shortness of breath and cough  EXAM: PORTABLE CHEST - 1 VIEW  COMPARISON:  04/21/2014  FINDINGS: Median sternotomy wires, surgical clips and CABG markers are present. Metallic clips project in the left axilla. A vascular stent projects over the right shoulder.  The cardiac silhouette is enlarged. The mediastinal contours are within normal limits. Atherosclerotic aortic calcifications are present.  Interstitial prominence is present in the lung bases, right slightly greater than left. There is no pleural effusion on the right. The left costophrenic angle is excluded from the field of view, limiting evaluation. There is no pneumothorax.  No acute osseous abnormality is identified.  IMPRESSION: 1. Mild cardiomegaly with probable mild interstitial pulmonary edema. Interstitial pneumonitis could have a similar appearance. Clinical correlation is recommended. 2. Prior CABG.   Electronically Signed   By: Rosemarie Ax   On: 10/23/2014 12:09    MDM  Patient having symptoms typical of angina. He will also require hemodialysis today Final diagnoses:  None   Patient requires midodrine prior to getting hemodialysis as his daughter reports his blood pressure drops without dosage of midodrine. Not  That hemoglobin has dropped approximately 3 grams in past 3 weeks. Platelet count remains stable I consulted at [facility] heart care cardiology service who will evaluate patient for unstable angina Nephrology service consult did to arrange for hemodialysis Internal medicine service will arrange for inpatient stay Diagnosis #1 unstable angina #2 congestive heart failure #3 hyperkalemia #4 anemia #5 thrombocytopenia   Orlie Dakin, MD 10/23/14 1413

## 2014-10-23 NOTE — Progress Notes (Signed)
   Daily Progress Note  Assessment/Planning: POD #22 s/p L thigh AVG segment exc complicated with possible wound infection, unstable angina   No need for emergent exploration would let cardiac work-up be completed before contemplating exploring L thigh  Dr. Kellie Simmering noted on 10/20/14 continued drainage and incomplete heals so I suspect residual infection in the prior surgical wound  At some point, will need exploration of wound in OR with possible graft excision if found to be involved with the infectious focus  We check on pt tomorrow  Subjective    No fever or chills, reported purulent drainage from surgical wound  Objective Filed Vitals:   10/23/14 1345 10/23/14 1445 10/23/14 1510 10/23/14 1529  BP: 123/66 138/68 166/82 153/71  Pulse: 79 80 79 78  Temp:  97.7 F (36.5 C)    TempSrc:  Oral    Resp: 24 22    SpO2: 95% 95%     No intake or output data in the 24 hours ending 10/23/14 1532  VASC  Two punctate lesion in prior surgical incision with serosang drainage, did not probe as pt on HD run currently  Laboratory CBC    Component Value Date/Time   WBC 5.5 10/23/2014 1116   WBC 1.7* 10/20/2014 1052   HGB 8.0* 10/23/2014 1116   HGB 8.2* 10/20/2014 1052   HCT 25.5* 10/23/2014 1116   HCT 25.8* 10/20/2014 1052   PLT 107* 10/23/2014 1116   PLT 82* 10/20/2014 1052    BMET    Component Value Date/Time   NA 138 10/23/2014 1116   NA 142 08/26/2013 1119   K 6.3* 10/23/2014 1116   K 4.1 08/26/2013 1119   K 6.0 03/23/2011 0912   CL 97 10/23/2014 1116   CO2 23 10/23/2014 1116   CO2 33* 08/26/2013 1119   GLUCOSE 96 10/23/2014 1116   GLUCOSE 104 08/26/2013 1119   BUN 62* 10/23/2014 1116   BUN 26.8* 08/26/2013 1119   CREATININE 8.30* 10/23/2014 1116   CREATININE 5.3* 08/26/2013 1119   CALCIUM 8.7 10/23/2014 1116   CALCIUM 8.1* 08/26/2013 1119   CALCIUM 7.8* 03/13/2012 0527   GFRNONAA 5* 10/23/2014 1116   GFRAA 6* 10/23/2014 [place], MD [facility]: [phone] Pager: [phone]  10/23/2014, 3:32 PM

## 2014-10-23 NOTE — Progress Notes (Signed)
Hemodialysis completed without issue. Awaiting placement.

## 2014-10-23 NOTE — Progress Notes (Signed)
ANTIBIOTIC/ANTICOAGULATION CONSULT NOTE - INITIAL  Pharmacy Consult for Vancomycin and Heparin Indication: purulent drainage from non-functioning portion of thigh graft in HD patient and r/o ACS  No Known Allergies  Patient Measurements: Ht: 68 in   Wt: ~80kg Weight:  (uta pt on stretcher)   Vital Signs: Temp: 97.7 F (36.5 C) (11/27 1445) Temp Source: Oral (11/27 1445) BP: 153/71 mmHg (11/27 1529) Pulse Rate: 78 (11/27 1529) Intake/Output from previous day:   Intake/Output from this shift:    Antibiotic Labs:  Recent Labs  10/23/14 1116  WBC 5.5  HGB 8.0*  PLT 107*  CREATININE 8.30*   Estimated Creatinine Clearance: 6.2 mL/min (by C-G formula based on Cr of 8.3). No results for input(s): VANCOTROUGH, VANCOPEAK, VANCORANDOM, GENTTROUGH, GENTPEAK, GENTRANDOM, TOBRATROUGH, TOBRAPEAK, TOBRARND, AMIKACINPEAK, AMIKACINTROU, AMIKACIN in the last 72 hours.   Microbiology: No results found for this or any previous visit (from the past 720 hour(s)).  Anticoagulation Labs:  Recent Labs  10/23/14 1116 10/23/14 1120  HGB 8.0*  --   HCT 25.5*  --   PLT 107*  --   CREATININE 8.30*  --   TROPONINI  --  <0.30    Estimated Creatinine Clearance: 6.2 mL/min (by C-G formula based on Cr of 8.3).   Medical History: Past Medical History  Diagnosis Date  . ESRD on hemodialysis     Hemodialysis since 2003; Occluded access in both upper extremities and the right lower extremity, current access as of Aug 2014 is L thigh AVG. Gets HD MWF at [facility].     . Duodenal ulcer     remote  . Chronic combined systolic and diastolic CHF (congestive heart failure)     a. 02/2012 Echo: EF 65%, basal infolat AK, mild conc LVH;  b. 06/2013 Echo: EF 40-45%, mild LVH, Gr2 DD, basal inf HK->AK, Mod AS, Mild MR, PASP 73  . Arteriosclerotic cardiovascular disease (ASCVD) 1998    a. [id number]; b. 06/2003 Cath: LM 70d, LAD 70ost, 80p, LCX 70p/145m, RCA 80p/195m, LIMA->LAD patent,  VG->OM2->OM3 patent, VG->dRCA patent, EF 55%;  c. 02/2012 Lexi MV: inf/inflat scarring w/ ? minimal peri-infarct ischemia.  . Cerebrovascular disease   . Neuropathy of foot   . Degenerative joint disease   . Gastroesophageal reflux disease   . Impaired hearing     Bilateral; hearing aids relatively ineffective  . Hypertension   . Cholelithiasis 03/2011    Diagnosed incidentally on abdominal ultrasound in 03/2011  . Hepatic steatosis   . Macrocytosis 10/20/2012  . Thrombocytopenia 10/20/2012  . Hyperlipidemia   . Secondary hyperparathyroidism   . Peripheral vascular disease   . Gout   . Moderate aortic stenosis     a. 06/2013 Echo: Mod AS, mean grad: 25, peak grad: 57.  . Pulmonary hypertension     a. 06/2013 Echo: PASP 8mmHg.  Marland Kitchen Anemia     Prior blood transfusion  . Complication of anesthesia     daughter reports long episodes confusion after amnesia meds  . Renal cell carcinoma 2001    s/p right nephrectomy-2001; subsequent ESRD  . 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 Hypotension   . Pancytopenia   . MRSA infection     Medications:  See electronic med rec  Assessment:  ID: 80 y.o. male presents with purulent drainage from the non-functional portion of the thigh AVG. ESRD - HD (M/W/F as o/p). He has had multiple vascular access issues over the years and had revision of the arterial side  of this thigh graft 10/1. Wound culture 10/13 grew enterobacter/MRSA which was treated with Vanc and Fortaz through 10/30. Subsequent cultures 10/30 and 11/13 were + for MRSA, enterococcus faecalis, bacteroides fragilis and Veillonella. He received Vancomycin 10/30 - 11/4 and then 11/16 through now. Spoke with [place] [facility] and last dose of Vancomycin 1gm was given post HD on Tues 11/24. To continue Vancomycin and add Tressie Ellis for broad spectrum coverage of thigh graft infection. Pt receiving HD right now.   AC: Pt also with CP - cardiology consulted. Plan to begin  heparin for r/o ACS and plans for cath on Monday. Noted pt with h/o anemia/myelodysplastic syndrome followed by hem/onc. Hgb 8 upon admission to receive PRBC x 1 with HD today. Plt 107 (pt with chronic thrombocytopenia 66-93 over past month).  Goal of Therapy:  Heparin level 0.3-0.7 units/ml; vancomycin pre-HD level 15-25 mcg/ml Monitor platelets by anticoagulation protocol: Yes   Plan:  1. Vancomycin 1gm IV M/W/F with HD 2. Will f/u pre-HD level on Monday (pt has been receiving as o/p so he is at [facility]) 3. Heparin 4000 unit IV bolus 4. Heparin gtt at 950 units/hr 5. Will f/u 8 hour heparin level 6. F/u heparin level and CBC daily  Sherlon Handing, PharmD, BCPS Clinical pharmacist, pager [phone] 10/23/2014,3:31 PM

## 2014-10-24 ENCOUNTER — Encounter (HOSPITAL_COMMUNITY): Payer: Self-pay | Admitting: Internal Medicine

## 2014-10-24 ENCOUNTER — Inpatient Hospital Stay (HOSPITAL_COMMUNITY): Payer: Medicare Other

## 2014-10-24 DIAGNOSIS — I739 Peripheral vascular disease, unspecified: Secondary | ICD-10-CM

## 2014-10-24 DIAGNOSIS — I359 Nonrheumatic aortic valve disorder, unspecified: Secondary | ICD-10-CM

## 2014-10-24 DIAGNOSIS — N186 End stage renal disease: Secondary | ICD-10-CM | POA: Insufficient documentation

## 2014-10-24 DIAGNOSIS — E875 Hyperkalemia: Secondary | ICD-10-CM | POA: Insufficient documentation

## 2014-10-24 DIAGNOSIS — Z992 Dependence on renal dialysis: Secondary | ICD-10-CM

## 2014-10-24 DIAGNOSIS — T827XXA Infection and inflammatory reaction due to other cardiac and vascular devices, implants and grafts, initial encounter: Secondary | ICD-10-CM | POA: Diagnosis present

## 2014-10-24 DIAGNOSIS — R0602 Shortness of breath: Secondary | ICD-10-CM | POA: Insufficient documentation

## 2014-10-24 DIAGNOSIS — R072 Precordial pain: Secondary | ICD-10-CM

## 2014-10-24 DIAGNOSIS — I5043 Acute on chronic combined systolic (congestive) and diastolic (congestive) heart failure: Secondary | ICD-10-CM

## 2014-10-24 DIAGNOSIS — R0789 Other chest pain: Secondary | ICD-10-CM

## 2014-10-24 LAB — RENAL FUNCTION PANEL
Albumin: 3.1 g/dL — ABNORMAL LOW (ref 3.5–5.2)
Anion gap: 14 (ref 5–15)
BUN: 29 mg/dL — ABNORMAL HIGH (ref 6–23)
CO2: 25 mEq/L (ref 19–32)
CREATININE: 4.49 mg/dL — AB (ref 0.50–1.35)
Calcium: 8.3 mg/dL — ABNORMAL LOW (ref 8.4–10.5)
Chloride: 97 mEq/L (ref 96–112)
GFR calc non Af Amer: 11 mL/min — ABNORMAL LOW (ref >90)
GFR, EST AFRICAN AMERICAN: 12 mL/min — AB (ref >90)
GLUCOSE: 81 mg/dL (ref 70–99)
PHOSPHORUS: 2.6 mg/dL (ref 2.3–4.6)
Potassium: 5.1 mEq/L (ref 3.7–5.3)
SODIUM: 136 meq/L — AB (ref 137–147)

## 2014-10-24 LAB — CBC
HCT: 26.7 % — ABNORMAL LOW (ref 39.0–52.0)
Hemoglobin: 8.7 g/dL — ABNORMAL LOW (ref 13.0–17.0)
MCH: 35.5 pg — ABNORMAL HIGH (ref 26.0–34.0)
MCHC: 32.6 g/dL (ref 30.0–36.0)
MCV: 109 fL — ABNORMAL HIGH (ref 78.0–100.0)
Platelets: 84 10*3/uL — ABNORMAL LOW (ref 150–400)
RBC: 2.45 MIL/uL — ABNORMAL LOW (ref 4.22–5.81)
RDW: 20.2 % — ABNORMAL HIGH (ref 11.5–15.5)
WBC: 2.8 10*3/uL — ABNORMAL LOW (ref 4.0–10.5)

## 2014-10-24 LAB — TROPONIN I
Troponin I: <0.3 ng/mL (ref <0.30)
Troponin I: <0.3 ng/mL (ref <0.30)

## 2014-10-24 LAB — HEPARIN LEVEL (UNFRACTIONATED)
Heparin Unfractionated: <0.1 IU/mL — ABNORMAL LOW (ref 0.30–0.70)
Heparin Unfractionated: 0.16 IU/mL — ABNORMAL LOW (ref 0.30–0.70)

## 2014-10-24 MED ORDER — HEPARIN (PORCINE) IN NACL 100-0.45 UNIT/ML-% IJ SOLN
1550.0000 [IU]/h | INTRAMUSCULAR | Status: DC
  Administered 2014-10-24 (×2): 1350 [IU]/h via INTRAVENOUS
  Administered 2014-10-25: 1550 [IU]/h via INTRAVENOUS
  Filled 2014-10-24 (×4): qty 250

## 2014-10-24 MED ORDER — DEXTROSE 5 % IV SOLN
2.0000 g | Freq: Once | INTRAVENOUS | Status: AC
  Administered 2014-10-24: 2 g via INTRAVENOUS
  Filled 2014-10-24 (×2): qty 2

## 2014-10-24 MED ORDER — HEPARIN BOLUS VIA INFUSION
2000.0000 [IU] | Freq: Once | INTRAVENOUS | Status: AC
  Administered 2014-10-24: 2000 [IU] via INTRAVENOUS
  Filled 2014-10-24: qty 2000

## 2014-10-24 NOTE — Progress Notes (Signed)
Pharmacy Re: Andrew [name]  It appears Andrew [name] 2g was not given after HD yesterday as ordered.  Will re-dose today.  Uvaldo Rising, BCPS  Clinical Pharmacist Pager [phone]  10/24/2014 10:53 AM

## 2014-10-24 NOTE — Progress Notes (Addendum)
ANTICOAGULATION CONSULT NOTE - Follow Up Consult  Pharmacy Consult for Heparin Indication: r/o ACS  No Known Allergies  Patient Measurements: Height: 5\' 8"  (172.7 cm) Weight: 164 lb 4.8 oz (74.526 kg) IBW/kg (Calculated) : 68.4 Heparin Dosing Weight: 75 kg  Vital Signs: Temp: 98.5 F (36.9 C) (11/28 0509) Temp Source: Oral (11/28 0509) BP: 133/57 mmHg (11/28 0509) Pulse Rate: 87 (11/28 0509)  Labs:  Recent Labs  10/23/14 1116 10/23/14 1120 10/23/14 2226 10/24/14 0300 10/24/14 0640  HGB 8.0*  --   --  8.7*  --   HCT 25.5*  --   --  26.7*  --   PLT 107*  --   --  84*  --   HEPARINUNFRC  --   --   --   --  <0.10*  CREATININE 8.30*  --   --  4.49*  --   TROPONINI  --  <0.30 <0.30 <0.30  --     Estimated Creatinine Clearance: 11.4 mL/min (by C-G formula based on Cr of 4.49).   Assessment: 80yo male with ESRD who presented with CP, on heparin for r/o ACS, cardiology consulted, plans for cath Monday AM.  Noted pt with h/o anemia/myelodysplastic syndrome followed by hem/onc. Hgb 8 upon admission to receive PRBC x 1 with HD 11/27. Plt 107>>84 (pt with chronic thrombocytopenia 66-93 over past month).  Initial heparin level came back low this AM <0.1. Note, pt did not receive heparin bolus that was ordered yesterday.  Goal of Therapy:  Heparin level 0.3-0.7 units/ml Monitor platelets by anticoagulation protocol: Yes   Plan:  Heparin 2000 unit IV bolus Increase heparin gtt to 1200 units/hr Check 8hr heparin level at 1630 Monitor CBC, PLT, and s/sx of bleeding Monitor heparin level daily  Drucie Opitz, PharmD Clinical Pharmacy Resident Pager: [phone] 10/24/2014 8:29 AM

## 2014-10-24 NOTE — Progress Notes (Signed)
Echo Lab  2D Echocardiogram completed.  [place], RDCS 10/24/2014 10:07 AM

## 2014-10-24 NOTE — Progress Notes (Addendum)
Subjective: This AM, he was eating breakfast at the time of interview. He reports no complaints and has had no chest pain since being admitted. Telemetry showed PVCs overnight with some ventricular bigeminy.   Objective: Vital signs in last 24 hours: Filed Vitals:   10/23/14 1852 10/23/14 1903 10/23/14 2132 10/24/14 0509  BP: 107/48 114/70 137/69 133/57  Pulse: 89 88 93 87  Temp:  97.6 F (36.4 C) 99.2 F (37.3 C) 98.5 F (36.9 C)  TempSrc:  Oral Oral Oral  Resp:  16 20 18   Height:   5\' 8"  (1.727 m)   Weight:   169 lb 8.5 oz (76.9 kg) 164 lb 4.8 oz (74.526 kg)  SpO2:  100% 99% 96%   Weight change:   Intake/Output Summary (Last 24 hours) at 10/24/14 1344 Last data filed at 10/23/14 2100  Gross per 24 hour  Intake    435 ml  Output   2495 ml  Net  -2060 ml   General: resting in bed, NAD, hard of hearing HEENT: PERRL, EOMI, no scleral icterus, oropharynx clear Cardiac: RRR, no rubs, murmurs or gallops Pulm: clear to auscultation bilaterally, no wheezes, rales, or rhonchi Abd: soft, nontender, nondistended, BS present Ext: warm and well perfused, no pedal edema Neuro: responds to questions appropriately; moving all extremities freely   Lab Results: Basic Metabolic Panel:  Recent Labs Lab 10/23/14 1116 10/24/14 0300  NA 138 136*  K 6.3* 5.1  CL 97 97  CO2 23 25  GLUCOSE 96 81  BUN 62* 29*  CREATININE 8.30* 4.49*  CALCIUM 8.7 8.3*  PHOS  --  2.6   Liver Function Tests:  Recent Labs Lab 10/24/14 0300  ALBUMIN 3.1*   CBC:  Recent Labs Lab 10/20/14 1052 10/23/14 1116 10/24/14 0300  WBC 1.7* 5.5 2.8*  NEUTROABS 0.4*  --   --   HGB 8.2* 8.0* 8.7*  HCT 25.8* 25.5* 26.7*  MCV 112.9* 113.3* 109.0*  PLT 82* 107* 84*    Micro Results: Recent Results (from the past 240 hour(s))  Wound culture     Status: None (Preliminary result)   Collection Time: 10/23/14  3:10 PM  Result Value Ref Range Status   Specimen Description WOUND LEFT THIGH  Final   Special Requests PT RECEIVED VANC ON 11/23  Final   Gram Stain   Final    FEW WBC PRESENT,BOTH PMN AND MONONUCLEAR NO SQUAMOUS EPITHELIAL CELLS SEEN NO ORGANISMS SEEN Performed at [facility]    Culture   Final    NO GROWTH 1 DAY Performed at [facility]    Report Status PENDING  Incomplete   Studies/Results: Dg Chest Port 1 View  10/23/2014   CLINICAL DATA:  80 year old with shortness of breath and cough  EXAM: PORTABLE CHEST - 1 VIEW  COMPARISON:  04/21/2014  FINDINGS: Median sternotomy wires, surgical clips and CABG markers are present. Metallic clips project in the left axilla. A vascular stent projects over the right shoulder.  The cardiac silhouette is enlarged. The mediastinal contours are within normal limits. Atherosclerotic aortic calcifications are present.  Interstitial prominence is present in the lung bases, right slightly greater than left. There is no pleural effusion on the right. The left costophrenic angle is excluded from the field of view, limiting evaluation. There is no pneumothorax.  No acute osseous abnormality is identified.  IMPRESSION: 1. Mild cardiomegaly with probable mild interstitial pulmonary edema. Interstitial pneumonitis could have a similar appearance. Clinical correlation is recommended. 2.  Prior CABG.   Electronically Signed   By: Andrew [name]   On: 10/23/2014 12:09   EKG: Reviewed and compared with yesterday -Irregular rhythm -Normal axis -QTc 473: up from 448  -aVF with limited voltage -PVC in II & III -Stable T-wave inversions in aVL   Medications: I have reviewed the patient's current medications. Scheduled Meds: . allopurinol  100 mg Oral BID  . aspirin EC  81 mg Oral QHS  . atorvastatin  10 mg Oral q1800  . cefTAZidime (FORTAZ)  IV  2 g Intravenous Q M,W,F-HD  . cinacalcet  30 mg Oral Q breakfast  . [START ON 10/26/2014] darbepoetin (ARANESP) injection - DIALYSIS  200 mcg Intravenous Q Mon-HD  . doxercalciferol  3  mcg Intravenous Q M,W,F-HD  . feeding supplement (NEPRO CARB STEADY)  237 mL Oral BID BM  . gabapentin  100 mg Oral Daily  . gabapentin  200 mg Oral QHS  . heparin  4,000 Units Intravenous Once  . midodrine  10 mg Oral TID WC  . polyethylene glycol  17 g Oral QHS  . sevelamer carbonate  2,400 mg Oral TID WC  . vancomycin  1,000 mg Intravenous Q M,W,F-HD   Continuous Infusions: . heparin 1,200 Units/hr (10/24/14 0857)   PRN Meds:.sodium chloride, sodium chloride, albuterol, docusate sodium, feeding supplement (NEPRO CARB STEADY), heparin, heparin, lidocaine (PF), lidocaine-prilocaine, pentafluoroprop-tetrafluoroeth, sevelamer carbonate Assessment/Plan: Active Problems:   Chest pain   Vascular device, implant, or graft infection or inflammation   SOB (shortness of breath)   ESRD on dialysis   Hyperkalemia  Andrew [name] is an 80 year old with multiple medical co-morbidities hospitalized for chest pain found to have graft site infection.  Chest pain: Troponins negative x 3. EKG findings otherwise stable. Scheduled for cath on Monday. -Continue ASA 81mg  & Lipitor 10mg  -Continue heparin per Cardiology -Cardiology following, appreciate recs  Recurrent graft site infections: Afebrile overnight. Vascular assessed it to be a superficial abscess with subsequent I&D.  -Continue Andrew [name] & vancomycin (Day 2) -Cultures negative x 1 day -Continue wet-to-dry dressing BID per Vascular -Continue assessing for signs of infection  ESRD: Nephrology following, appreciate recs -Continue home Sevelamer, Hectorol, mitodrine, Nepro  MDS: Pancytopenia related to this condition. Hb 9 today. -s/p pRBC x 1  Combined systolic and diastolic CHF: EF 26%, grade 2 diastolic dysfunction. Wt 164, down from 169 on admission. Net -2L on admission.  -Follow-up echo  Gout: Continue allopurinol 100mg .  HLD: Continue statin as noted above.  Peripheral neuropathy: Continue home gabapentin.  Presumed COPD: No PFTs  on file though will continue home albuterol.  #FEN:  -Diet: Renal  #DVT prophylaxis: heparin 5000 units subcutaneous  #CODE STATUS: FULL CODE -Confirmed with him at bedside on admission   Dispo: Disposition is deferred at this time, awaiting improvement of current medical problems.    The patient does have a current PCP Redmond School, MD) and does not need an [facility] hospital follow-up appointment after discharge.  The patient does have transportation limitations that hinder transportation to clinic appointments.  .Services Needed at time of discharge: Y = Yes, Blank = No PT:   OT:   RN:   Equipment:   Other:     LOS: 1 day   Charlott Rakes, MD 10/24/2014, 1:44 PM

## 2014-10-24 NOTE — Progress Notes (Signed)
Patient ID: Andrew [name], male   DOB: 05/17/1926, 80 y.o.   MRN: [id number]  See complete consult note by Dr.Bensimhon yesterday. It appears that echo was ordered yesterday in the late afternoon. It is not yet been done  [name], MD

## 2014-10-24 NOTE — Progress Notes (Signed)
ANTICOAGULATION CONSULT NOTE - Follow Up Consult  Pharmacy Consult for Heparin Indication: r/o ACS  No Known Allergies  Patient Measurements: Height: 5\' 8"  (172.7 cm) Weight: 164 lb 4.8 oz (74.526 kg) IBW/kg (Calculated) : 68.4 Heparin Dosing Weight: 75 kg  Vital Signs: Temp: 98.7 F (37.1 C) (11/28 1447) Temp Source: Oral (11/28 1447) BP: 123/62 mmHg (11/28 1300) Pulse Rate: 87 (11/28 1447)  Labs:  Recent Labs  10/23/14 1116  10/23/14 2226 10/24/14 0300 10/24/14 0640 10/24/14 0947 10/24/14 1644  HGB 8.0*  --   --  8.7*  --   --   --   HCT 25.5*  --   --  26.7*  --   --   --   PLT 107*  --   --  84*  --   --   --   HEPARINUNFRC  --   --   --   --  <0.10*  --  0.16*  CREATININE 8.30*  --   --  4.49*  --   --   --   TROPONINI  --   < > <0.30 <0.30  --  <0.30  --   < > = values in this interval not displayed.  Estimated Creatinine Clearance: 11.4 mL/min (by C-G formula based on Cr of 4.49).   Assessment: 80yo male with ESRD who presented with CP, on heparin for r/o ACS, cardiology consulted, plans for cath Monday AM.  Noted pt with h/o anemia/myelodysplastic syndrome followed by hem/onc. Hgb 8 upon admission to receive PRBC x 1 with HD 11/27. Plt 107>>84 (pt with chronic thrombocytopenia 66-93 over past month).  Heparin level still low despite earlier increase in heparin drip rate.  No bleeding or complications noted.  Goal of Therapy:  Heparin level 0.3-0.7 units/ml Monitor platelets by anticoagulation protocol: Yes   Plan:  Increase heparin gtt to 1350 units/hr Check 8hr heparin level tomorrow at 0200. Monitor CBC, PLT, and s/sx of bleeding Monitor heparin level daily  Uvaldo Rising, BCPS  Clinical Pharmacist Pager [phone]  10/24/2014 5:56 PM

## 2014-10-24 NOTE — Progress Notes (Addendum)
Progress Note    10/24/2014 9:15 AM Hospital Day 1  Subjective:  C/o shortness of breath  Tm 99.2 now afebrile VSS 96% RA  Filed Vitals:   10/24/14 0509  BP: 133/57  Pulse: 87  Temp: 98.5 F (36.9 C)  Resp: 18    Physical Exam: Extremities:  Left thigh wound without purulent drainage.  I&D with cotton tip applicator-appears this has already drained as there is no drainage from wound.  CBC    Component Value Date/Time   WBC 2.8* 10/24/2014 0300   WBC 1.7* 10/20/2014 1052   RBC 2.45* 10/24/2014 0300   RBC 2.28* 10/20/2014 1052   RBC 2.87* 12/24/2012 1710   HGB 8.7* 10/24/2014 0300   HGB 8.2* 10/20/2014 1052   HCT 26.7* 10/24/2014 0300   HCT 25.8* 10/20/2014 1052   PLT 84* 10/24/2014 0300   PLT 82* 10/20/2014 1052   MCV 109.0* 10/24/2014 0300   MCV 112.9* 10/20/2014 1052   MCV 102 03/23/2011 0909   MCH 35.5* 10/24/2014 0300   MCH 35.9* 10/20/2014 1052   MCHC 32.6 10/24/2014 0300   MCHC 31.8* 10/20/2014 1052   RDW 20.2* 10/24/2014 0300   RDW 17.4* 10/20/2014 1052   LYMPHSABS 0.8* 10/20/2014 1052   LYMPHSABS 0.7 08/13/2013 1915   MONOABS 0.3 10/20/2014 1052   MONOABS 0.1 08/13/2013 1915   EOSABS 0.1 10/20/2014 1052   EOSABS 0.0 08/13/2013 1915   BASOSABS 0.0 10/20/2014 1052   BASOSABS 0.0 08/13/2013 1915    BMET    Component Value Date/Time   NA 136* 10/24/2014 0300   NA 142 08/26/2013 1119   K 5.1 10/24/2014 0300   K 4.1 08/26/2013 1119   K 6.0 03/23/2011 0912   CL 97 10/24/2014 0300   CO2 25 10/24/2014 0300   CO2 33* 08/26/2013 1119   GLUCOSE 81 10/24/2014 0300   GLUCOSE 104 08/26/2013 1119   BUN 29* 10/24/2014 0300   BUN 26.8* 08/26/2013 1119   CREATININE 4.49* 10/24/2014 0300   CREATININE 5.3* 08/26/2013 1119   CALCIUM 8.3* 10/24/2014 0300   CALCIUM 8.1* 08/26/2013 1119   CALCIUM 7.8* 03/13/2012 0527   GFRNONAA 11* 10/24/2014 0300   GFRAA 12* 10/24/2014 0300    INR    Component Value Date/Time   INR 1.25 06/27/2013 0238      Intake/Output Summary (Last 24 hours) at 10/24/14 0915 Last data filed at 10/23/14 2100  Gross per 24 hour  Intake    435 ml  Output   2495 ml  Net  -2060 ml   Wound Culture left thigh 10/23/14: Gram Stain: FEW WBC PRESENT,BOTH PMN AND MONONUCLEAR  NO SQUAMOUS EPITHELIAL CELLS SEEN  NO ORGANISMS SEEN    Assessment/Plan:  80 y.o. male who is s/p left thigh AVG segment excision complicated with possible wound infection with unstable angina Hospital Day 1  -pt left thigh wound I&D'd by Dr. Bridgett Larsson at bedside this am.  There is no purulent material present in the wound and it appears clean.  Given the pt has unstable angina and is going for cardiac catheterization on Monday, we feel this will be the extent of what he needs at this time.  There are not any organisms present on gram stain.   -will order bid wet to dry dressing changes to left thigh wound with 2x2 and saline.   Leontine Locket, PA-C [facility] [phone] 10/24/2014 9:15 AM  Addendum  I have independently interviewed and examined the patient, and I agree with  the physician assistant's findings.  This patient essentially had a superficial abscess in the surgical site.  It was shallow and I bluntly unroofed this cavity with a CTA.  No further exploration needed.  Would continue wet-to-dry dressing to this cavity BID  Adele Barthel, MD [facility] [place] Office: [phone] Pager: [phone]  10/24/2014, 9:36 AM

## 2014-10-24 NOTE — Plan of Care (Signed)
Problem: Phase I Progression Outcomes Goal: Hemodynamically stable Outcome: Progressing  Problem: Phase II Progression Outcomes Goal: Anginal pain relieved Outcome: Completed/Met Date Met:  10/24/14

## 2014-10-24 NOTE — Progress Notes (Signed)
[facility] Rounding Note Subjective:  Getting his ECHO right now Still feeling short of breath although his sats are good He thinks a little O2 might help Some low grade temp to 99's yest afebrila this AM On Vanco and fortaz Had HD yesterday and received a unit of blood but says he is "no stronger"  Objective Vital signs in last 24 hours: Filed Vitals:   10/23/14 1852 10/23/14 1903 10/23/14 2132 10/24/14 0509  BP: 107/48 114/70 137/69 133/57  Pulse: 89 88 93 87  Temp:  97.6 F (36.4 C) 99.2 F (37.3 C) 98.5 F (36.9 C)  TempSrc:  Oral Oral Oral  Resp:  16 20 18   Height:   5\' 8"  (1.727 m)   Weight:   76.9 kg (169 lb 8.5 oz) 74.526 kg (164 lb 4.8 oz)  SpO2:  100% 99% 96%   Weight change:   Intake/Output Summary (Last 24 hours) at 10/24/14 0941 Last data filed at 10/23/14 2100  Gross per 24 hour  Intake    435 ml  Output   2495 ml  Net  -2060 ml     Physical Exam:  Blood pressure 133/57, pulse 87, temperature 98.5 F (36.9 C), temperature source Oral, resp. rate 18, height 5\' 8"  (1.727 m), weight 74.526 kg (164 lb 4.8 oz), SpO2 96 %. General: Chronically ill appearing WM, but NAD  Neck: Supple. +JVD Lungs: Anteriorly fairly clear Heart: Regular S1S2 no S3  2/6 murmur aortic area. Abdomen: Soft, non-tender, non-distended with normoactive bowel sounds. M-S: Strength and tone appear normal for age. Lower extremities: No sig LE edema. Left thigh AVG patent. Wound medial to the thigh AVG is packed - there is some sanguinous drainage bu no pus noted (VVS looked at earlier)  Neuro: Alert and oriented X 3. Hard of hearing Psych: Responds to questions appropriately with a normal affect. Dialysis Access: left thigh graft patent. Wound medial to the functional AVG as noted above  Labs: Basic Metabolic Panel:  Recent Labs Lab 10/23/14 1116 10/24/14 0300  NA 138 136*  K 6.3* 5.1  CL 97 97  CO2 23 25  GLUCOSE 96 81  BUN 62* 29*  CREATININE 8.30*  4.49*  CALCIUM 8.7 8.3*  PHOS  --  2.6    Recent Labs Lab 10/24/14 0300  ALBUMIN 3.1*   Recent Labs Lab 10/20/14 1052 10/23/14 1116 10/24/14 0300  WBC 1.7* 5.5 2.8*  NEUTROABS 0.4*  --   --   HGB 8.2* 8.0* 8.7*  HCT 25.8* 25.5* 26.7*  MCV 112.9* 113.3* 109.0*  PLT 82* 107* 84*     Recent Labs Lab 10/23/14 1120 10/23/14 2226 10/24/14 0300  TROPONINI <0.30 <0.30 <0.30   Studies/Results: Dg Chest Port 1 View  10/23/2014   CLINICAL DATA:  80 year old with shortness of breath and cough  EXAM: PORTABLE CHEST - 1 VIEW  COMPARISON:  04/21/2014  FINDINGS: Median sternotomy wires, surgical clips and CABG markers are present. Metallic clips project in the left axilla. A vascular stent projects over the right shoulder.  The cardiac silhouette is enlarged. The mediastinal contours are within normal limits. Atherosclerotic aortic calcifications are present.  Interstitial prominence is present in the lung bases, right slightly greater than left. There is no pleural effusion on the right. The left costophrenic angle is excluded from the field of view, limiting evaluation. There is no pneumothorax.  No acute osseous abnormality is identified.  IMPRESSION: 1. Mild cardiomegaly with probable mild interstitial pulmonary edema. Interstitial pneumonitis could  have a similar appearance. Clinical correlation is recommended. 2. Prior CABG.   Electronically Signed   By: Rosemarie Ax   On: 10/23/2014 12:09   Medications: . heparin 1,200 Units/hr (10/24/14 0857)   . allopurinol  100 mg Oral BID  . aspirin EC  81 mg Oral QHS  . atorvastatin  10 mg Oral q1800  . cefTAZidime (FORTAZ)  IV  2 g Intravenous Q M,W,F-HD  . cinacalcet  30 mg Oral Q breakfast  . [START ON 10/26/2014] darbepoetin (ARANESP) injection - DIALYSIS  200 mcg Intravenous Q Mon-HD  . doxercalciferol  3 mcg Intravenous Q M,W,F-HD  . feeding supplement (NEPRO CARB STEADY)  237 mL Oral BID BM  . gabapentin  100 mg Oral Daily  .  gabapentin  200 mg Oral QHS  . heparin  4,000 Units Intravenous Once  . midodrine  10 mg Oral TID WC  . polyethylene glycol  17 g Oral QHS  . sevelamer carbonate  2,400 mg Oral TID WC  . vancomycin  1,000 mg Intravenous Q M,W,F-HD    Dialysis Orders: Center: [facility] 160 2 K 3.5 Ca EDW 77.5 left thigh graft Hectorol 3 Aranesp 200 ^ from 160 to start 12/2 no Fe, heparin 2000 Recent labs: Hgb 7.9 11/24, 8.1 11/18, 9.1 11/11 9.9 11/4, 10.5 10/28 and 10.9 10/21---tsat 38% 11/18 iPTH 308 10/21 - Ca and P wnl  Assessment/Plan: 1. Chest pain/tightness hx CAD/CABG/mod AS - CXR on admission showed pul edema. 2.5  liters removed. Lungs sound fairly clear but still reports SOB. Troponins are flat. Sats are 100%. Below EDW of 77.5 (although weights difficult to interpret because lower today than post HD yesterday, both are under his EDW) CAD/AS may be contributors. Followup CXR. Cards following with plan for cath on Monday. ECHO being done now 2. CAD - as above 3. AS - reassessing as above  4. 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 Pt had left thigh graft revised 10/1 and reroutened the arterial half of the loop and on 11/5 a short segment of the infected exposed nonfunctional AVGG materal was removed. When seen by Dr. Kellie Simmering 11/24, there was no drainage. On admission the wound was draining purulent material from two holes.  Pharmacy to dose Vancomycin and Tressie Ellis will also be given. Gram stain negative. Dr. Bridgett Larsson now says that plans only wet:dry dressings after bedside I and D this AM. Will await culture results/ 5. ESRD - MWF - with hyperkalemia K 6.3 - last HD was 11/24 and only  ran 3 hr and 19 min but delivered kt/v 1.55. K today is still up some at 5.1 Recheck in AM.  Will prob need HD earlier rather than later on Monday to facilitate cath. 6. Chronic hypotension /volume - on midodrine 10 tid for BP support - always given pre HD 7. Anemia - Hgb trending down since October - could be related to chronic infected graft, lower Aranesp dose, meyelodysplasitc syndrome; Aranesp increased from 120 to 160 11/18 and 200 for 12/2 - received 160 this week; last tsat 38% 11/18- followed by Dr. Alvy Bimler with guidelines to transfuse when Hgb < 8; transfused 1 unit with HD yesterday - only increased to 8.7. I  have low threshold for transfusion in this pt until we see what his cardiac situation is. Tentatively plan for another unit on Monday with HD 8. Metabolic bone disease - has been on 3.5 Ca bath, sensipar, renvela, Hectorol with last iPTH of 308 in October Ca/P controlled - not clear why on added Ca bath - Had 2.5 Ca bath w/HD 11/27 and follow while in hospital 9. Nutrition - no appetite the past few days - renal diet + supplements/vit 10. MDS - profound pancytopenia with Revlimid- on hold now; on weekly Granix; Dr. Alvy Bimler following 11. Hx CVA 12. Hx recurrent bladder cancer   Jamal Maes, MD [facility] [phone] pager 10/24/2014, 9:41 AM

## 2014-10-25 LAB — RENAL FUNCTION PANEL
ANION GAP: 13 (ref 5–15)
Albumin: 2.9 g/dL — ABNORMAL LOW (ref 3.5–5.2)
BUN: 51 mg/dL — AB (ref 6–23)
CHLORIDE: 96 meq/L (ref 96–112)
CO2: 26 meq/L (ref 19–32)
Calcium: 7.6 mg/dL — ABNORMAL LOW (ref 8.4–10.5)
Creatinine, Ser: 6.72 mg/dL — ABNORMAL HIGH (ref 0.50–1.35)
GFR calc Af Amer: 8 mL/min — ABNORMAL LOW (ref >90)
GFR calc non Af Amer: 7 mL/min — ABNORMAL LOW (ref >90)
Glucose, Bld: 91 mg/dL (ref 70–99)
POTASSIUM: 5.6 meq/L — AB (ref 3.7–5.3)
Phosphorus: 2.9 mg/dL (ref 2.3–4.6)
Sodium: 135 mEq/L — ABNORMAL LOW (ref 137–147)

## 2014-10-25 LAB — HEPARIN LEVEL (UNFRACTIONATED)
HEPARIN UNFRACTIONATED: 0.19 [IU]/mL — AB (ref 0.30–0.70)
HEPARIN UNFRACTIONATED: 0.43 [IU]/mL (ref 0.30–0.70)
HEPARIN UNFRACTIONATED: 0.24 [IU]/mL — AB (ref 0.30–0.70)

## 2014-10-25 LAB — CBC
HCT: 25.2 % — ABNORMAL LOW (ref 39.0–52.0)
Hemoglobin: 8 g/dL — ABNORMAL LOW (ref 13.0–17.0)
MCH: 34.3 pg — ABNORMAL HIGH (ref 26.0–34.0)
MCHC: 31.7 g/dL (ref 30.0–36.0)
MCV: 108.2 fL — ABNORMAL HIGH (ref 78.0–100.0)
PLATELETS: 76 10*3/uL — AB (ref 150–400)
RBC: 2.33 MIL/uL — AB (ref 4.22–5.81)
RDW: 19.2 % — ABNORMAL HIGH (ref 11.5–15.5)
WBC: 2.2 10*3/uL — ABNORMAL LOW (ref 4.0–10.5)

## 2014-10-25 MED ORDER — SODIUM POLYSTYRENE SULFONATE 15 GM/60ML PO SUSP
30.0000 g | Freq: Once | ORAL | Status: AC
  Administered 2014-10-25: 30 g via ORAL
  Filled 2014-10-25: qty 120

## 2014-10-25 MED ORDER — HEPARIN BOLUS VIA INFUSION
2000.0000 [IU] | Freq: Once | INTRAVENOUS | Status: AC
  Administered 2014-10-25: 2000 [IU] via INTRAVENOUS
  Filled 2014-10-25: qty 2000

## 2014-10-25 MED ORDER — HEPARIN (PORCINE) IN NACL 100-0.45 UNIT/ML-% IJ SOLN
1800.0000 [IU]/h | INTRAMUSCULAR | Status: DC
  Administered 2014-10-26 – 2014-10-27 (×2): 1700 [IU]/h via INTRAVENOUS
  Filled 2014-10-25 (×4): qty 250

## 2014-10-25 MED ORDER — SODIUM CHLORIDE 0.9 % IV SOLN: INTRAVENOUS | Status: DC

## 2014-10-25 MED ORDER — ASPIRIN 81 MG PO CHEW
81.0000 mg | CHEWABLE_TABLET | ORAL | Status: AC
  Administered 2014-10-26: 81 mg via ORAL
  Filled 2014-10-25: qty 1

## 2014-10-25 MED ORDER — SODIUM CHLORIDE 0.9 % IJ SOLN
3.0000 mL | Freq: Two times a day (BID) | INTRAMUSCULAR | Status: DC
  Administered 2014-10-26: 3 mL via INTRAVENOUS

## 2014-10-25 NOTE — Progress Notes (Addendum)
Patient ID: Andrew [name], male   DOB: May 15, 1926, 80 y.o.   MRN: [id number]    SUBJECTIVE: Patient is stable and resting at this time. His two-dimensional echo shows a peak aortic valve gradient of 34 mmHg with mean of 19 mmHg. There is motion seen of the aortic leaflets. This data is consistent with moderate aortic stenosis. I think this will hold true independent of the fact that he needs Midodrine to maintain his blood pressure.   odrineFiled Vitals:   10/24/14 1300 10/24/14 1447 10/24/14 2211 10/25/14 0457  BP: 123/62  151/63 140/55  Pulse:  87 80 85  Temp:  98.7 F (37.1 C) 98.6 F (37 C) 98.4 F (36.9 C)  TempSrc:  Oral Oral Oral  Resp:  18 18 18   Height:      Weight:      SpO2:  97% 100% 96%     Intake/Output Summary (Last 24 hours) at 10/25/14 0949 Last data filed at 10/24/14 2102  Gross per 24 hour  Intake    360 ml  Output      0 ml  Net    360 ml    LABS: Basic Metabolic Panel:  Recent Labs  10/24/14 0300 10/25/14 0224  NA 136* 135*  K 5.1 5.6*  CL 97 96  CO2 25 26  GLUCOSE 81 91  BUN 29* 51*  CREATININE 4.49* 6.72*  CALCIUM 8.3* 7.6*  PHOS 2.6 2.9   Liver Function Tests:  Recent Labs  10/24/14 0300 10/25/14 0224  ALBUMIN 3.1* 2.9*   No results for input(s): LIPASE, AMYLASE in the last 72 hours. CBC:  Recent Labs  10/24/14 0300 10/25/14 0224  WBC 2.8* 2.2*  HGB 8.7* 8.0*  HCT 26.7* 25.2*  MCV 109.0* 108.2*  PLT 84* 76*   Cardiac Enzymes:  Recent Labs  10/23/14 2226 10/24/14 0300 10/24/14 0947  TROPONINI <0.30 <0.30 <0.30   BNP: Invalid input(s): POCBNP D-Dimer: No results for input(s): DDIMER in the last 72 hours. Hemoglobin A1C: No results for input(s): HGBA1C in the last 72 hours. Fasting Lipid Panel: No results for input(s): CHOL, HDL, LDLCALC, TRIG, CHOLHDL, LDLDIRECT in the last 72 hours. Thyroid Function Tests: No results for input(s): TSH, T4TOTAL, T3FREE, THYROIDAB in the last 72 hours.  Invalid input(s):  FREET3  RADIOLOGY: Dg Chest Port 1 View  10/24/2014   CLINICAL DATA:  Shortness of breath  EXAM: PORTABLE CHEST - 1 VIEW  COMPARISON:  10/23/2014  FINDINGS: Mild patchy opacity in the right upper and lower lobes, worrisome for multifocal pneumonia, less likely asymmetric interstitial edema. No pleural effusion or pneumothorax.  Cardiomegaly.  Postsurgical changes related to prior CABG.  Stable right axillary stent.  IMPRESSION: Mild patchy opacity in the right upper and lower lobes, worrisome for multifocal pneumonia, less likely asymmetric interstitial edema.   Electronically Signed   By: Julian Hy M.D.   On: 10/24/2014 13:50   Dg Chest Port 1 View  10/23/2014   CLINICAL DATA:  80 year old with shortness of breath and cough  EXAM: PORTABLE CHEST - 1 VIEW  COMPARISON:  04/21/2014  FINDINGS: Median sternotomy wires, surgical clips and CABG markers are present. Metallic clips project in the left axilla. A vascular stent projects over the right shoulder.  The cardiac silhouette is enlarged. The mediastinal contours are within normal limits. Atherosclerotic aortic calcifications are present.  Interstitial prominence is present in the lung bases, right slightly greater than left. There is no pleural effusion on the right. The left  costophrenic angle is excluded from the field of view, limiting evaluation. There is no pneumothorax.  No acute osseous abnormality is identified.  IMPRESSION: 1. Mild cardiomegaly with probable mild interstitial pulmonary edema. Interstitial pneumonitis could have a similar appearance. Clinical correlation is recommended. 2. Prior CABG.   Electronically Signed   By: Rosemarie Ax   On: 10/23/2014 12:09    PHYSICAL EXAM  HE IS RESTING THIS MORNING. CARDIAC EXAM REVEALS S1 AND S2. THERE IS A MURMUR OF AORTIC STENOSIS. THERE IS NO SIGNIFICANT PERIPHERAL EDEMA.    TELEMETRY: I have reviewed telemetry today October 25, 2014. There is normal sinus rhythm. The rate is 75.     ASSESSMENT AND PLAN:    CAD (coronary artery disease)   Unstable angina   Aortic stenosis      Cardiac catheterization is being planned for tomorrow. I will try to check on a preliminary time and communicate this to the patient and his family. Will also try to be sure that all orders are been written (I think they have been written). I spoke with family in the room.  There is question as to whether or not the fact that he needs Midodrine to support his blood pressure affects the computation of his aortic stenosis. I do not think that this is the case. Echo data is consistent with moderate aortic stenosis.    Vascular device, implant, or graft infection or inflammation      ESRD on dialysis   Hyperkalemia   Andrew [name] 10/25/2014 9:49 AM

## 2014-10-25 NOTE — Progress Notes (Signed)
ANTICOAGULATION CONSULT NOTE  Pharmacy Consult for Heparin Indication: r/o ACS  No Known Allergies  Patient Measurements: Height: 5\' 8"  (172.7 cm) Weight: 164 lb 4.8 oz (74.526 kg) IBW/kg (Calculated) : 68.4 Heparin Dosing Weight: 75 kg  Vital Signs: Temp: 98.6 F (37 C) (11/28 2211) Temp Source: Oral (11/28 2211) BP: 151/63 mmHg (11/28 2211) Pulse Rate: 80 (11/28 2211)  Labs:  Recent Labs  10/23/14 1116  10/23/14 2226 10/24/14 0300 10/24/14 0640 10/24/14 0947 10/24/14 1644 10/25/14 0224 10/25/14 0227  HGB 8.0*  --   --  8.7*  --   --   --  8.0*  --   HCT 25.5*  --   --  26.7*  --   --   --  25.2*  --   PLT 107*  --   --  84*  --   --   --  76*  --   HEPARINUNFRC  --   --   --   --  <0.10*  --  0.16*  --  0.19*  CREATININE 8.30*  --   --  4.49*  --   --   --   --   --   TROPONINI  --   < > <0.30 <0.30  --  <0.30  --   --   --   < > = values in this interval not displayed.  Estimated Creatinine Clearance: 11.4 mL/min (by C-G formula based on Cr of 4.49).   Assessment: 80 yo male with chest pain for heparin  Goal of Therapy:  Heparin level 0.3-0.7 units/ml Monitor platelets by anticoagulation protocol: Yes   Plan:  Heparin 2000 units IV bolus, then increase heparin 1550 units/hr Check heparin level in 8 hours.  Phillis Knack, PharmD, BCPS   10/25/2014 2:57 AM

## 2014-10-25 NOTE — Progress Notes (Signed)
[facility] Rounding Note Subjective:  Afebrile Says not short of breath  Very hard of hearing  Objective Vital signs in last 24 hours: Filed Vitals:   10/24/14 1300 10/24/14 1447 10/24/14 2211 10/25/14 0457  BP: 123/62  151/63 140/55  Pulse:  87 80 85  Temp:  98.7 F (37.1 C) 98.6 F (37 C) 98.4 F (36.9 C)  TempSrc:  Oral Oral Oral  Resp:  18 18 18   Height:      Weight:      SpO2:  97% 100% 96%   Weight change:   Intake/Output Summary (Last 24 hours) at 10/25/14 0800 Last data filed at 10/24/14 2102  Gross per 24 hour  Intake    480 ml  Output      0 ml  Net    480 ml     Physical Exam:  Blood pressure 133/57, pulse 87, temperature 98.5 F (36.9 C), temperature source Oral, resp. rate 18, height 5\' 8"  (1.727 m), weight 74.526 kg (164 lb 4.8 oz), SpO2 96 %. No weight today General: Chronically ill appearing WM, but NAD  Neck: Supple. +JVD Lungs: Anteriorly fairly clear but with soft exp wheeze Heart: Regular S1S2 no S3  2/6 murmur aortic area. Abdomen: Soft, non-tender, non-distended with normoactive bowel sounds. M-S: Strength and tone appear normal for age. Lower extremities: Trace LE edema. Left thigh AVG patent. Wound medial to the thigh AVG is packed beneath a dry dressing. Neuro: Alert and oriented X 3. Hard of hearing Psych: Responds to questions appropriately with a normal affect. Dialysis Access: left thigh graft patent. Wound medial to the functional AVG as noted above  Labs: Basic Metabolic Panel:  Recent Labs Lab 10/23/14 1116 10/24/14 0300 10/25/14 0224  NA 138 136* 135*  K 6.3* 5.1 5.6*  CL 97 97 96  CO2 23 25 26   GLUCOSE 96 81 91  BUN 62* 29* 51*  CREATININE 8.30* 4.49* 6.72*  CALCIUM 8.7 8.3* 7.6*  PHOS  --  2.6 2.9    Recent Labs Lab 10/24/14 0300 10/25/14 0224  ALBUMIN 3.1* 2.9*    Recent Labs Lab 10/20/14 1052 10/23/14 1116 10/24/14 0300 10/25/14 0224  WBC 1.7* 5.5 2.8* 2.2*  NEUTROABS 0.4*  --   --    --   HGB 8.2* 8.0* 8.7* 8.0*  HCT 25.8* 25.5* 26.7* 25.2*  MCV 112.9* 113.3* 109.0* 108.2*  PLT 82* 107* 84* 76*   Wound Culture (11/27) FEW STAPHYLOCOCCUS AUREUS  Note: RIFAMPIN AND GENTAMICIN SHOULD NOT BE USED AS SINGLE DRUGS FOR TREATMENT OF STAPH INFECTIONS.  Performed at [place] [facility] 10/23/14 1120 10/23/14 2226 10/24/14 0300 10/24/14 0947  TROPONINI <0.30 <0.30 <0.30 <0.30   Studies/Results: Dg Chest Port 1 View  10/24/2014   CLINICAL DATA:  Shortness of breath  EXAM: PORTABLE CHEST - 1 VIEW  COMPARISON:  10/23/2014  FINDINGS: Mild patchy opacity in the right upper and lower lobes, worrisome for multifocal pneumonia, less likely asymmetric interstitial edema. No pleural effusion or pneumothorax.  Cardiomegaly.  Postsurgical changes related to prior CABG.  Stable right axillary stent.  IMPRESSION: Mild patchy opacity in the right upper and lower lobes, worrisome for multifocal pneumonia, less likely asymmetric interstitial edema.   Electronically Signed   By: Julian Hy M.D.   On: 10/24/2014 13:50   Dg Chest Port 1 View  10/23/2014   CLINICAL DATA:  80 year old with  shortness of breath and cough  EXAM: PORTABLE CHEST - 1 VIEW  COMPARISON:  04/21/2014  FINDINGS: Median sternotomy wires, surgical clips and CABG markers are present. Metallic clips project in the left axilla. A vascular stent projects over the right shoulder.  The cardiac silhouette is enlarged. The mediastinal contours are within normal limits. Atherosclerotic aortic calcifications are present.  Interstitial prominence is present in the lung bases, right slightly greater than left. There is no pleural effusion on the right. The left costophrenic angle is excluded from the field of view, limiting evaluation. There is no pneumothorax.  No acute osseous abnormality is identified.  IMPRESSION: 1. Mild cardiomegaly with probable mild interstitial pulmonary  edema. Interstitial pneumonitis could have a similar appearance. Clinical correlation is recommended. 2. Prior CABG.   Electronically Signed   By: Rosemarie Ax   On: 10/23/2014 12:09   11/28 2D ECHO EF 45% "moderate" AS Gradient 19 mm   Medications: . heparin 1,550 Units/hr (10/25/14 0341)   . allopurinol  100 mg Oral BID  . aspirin EC  81 mg Oral QHS  . atorvastatin  10 mg Oral q1800  . cefTAZidime (FORTAZ)  IV  2 g Intravenous Q M,W,F-HD  . cinacalcet  30 mg Oral Q breakfast  . [START ON 10/26/2014] darbepoetin (ARANESP) injection - DIALYSIS  200 mcg Intravenous Q Mon-HD  . doxercalciferol  3 mcg Intravenous Q M,W,F-HD  . feeding supplement (NEPRO CARB STEADY)  237 mL Oral BID BM  . gabapentin  100 mg Oral Daily  . gabapentin  200 mg Oral QHS  . heparin  4,000 Units Intravenous Once  . midodrine  10 mg Oral TID WC  . polyethylene glycol  17 g Oral QHS  . sevelamer carbonate  2,400 mg Oral TID WC  . vancomycin  1,000 mg Intravenous Q M,W,F-HD    Dialysis Orders: Center: [place] 160 2 K 3.5 Ca EDW 77.5 left thigh graft Hectorol 3 Aranesp 200 ^ from 160 to start 12/2 no Fe, heparin 2000 Recent labs from dialysis prior to admissioin: Hgb 7.9 11/24, 8.1 11/18, 9.1 11/11 9.9 11/4, 10.5 10/28 and 10.9 10/21---tsat 38% 11/18 iPTH 308 10/21 - Ca and P wnl  Assessment/Plan: 1. Chest pain/tightness hx CAD/CABG - CXR on admission showed pul edema. 2.5  liters removed. Repeat CXR 11/28 with patchy densities questioning infiltrate vs asymmetric edema. Troponins are flat. Sats are 100%. Last weight (11/28) below EDW of 77.5 (although weights difficult to interpret because 1 day post HD than immediately post HD, both are under his EDW) Cards following with plan for cath on Monday. Will need dialysis prior to going to cath (K and volume) 2. CAD/prior CABG - as above. LVEF 45% by ECHO. Monday cath 3. AS - ECHO "moderate". Mean gradient 19/peak gradient 34 (but in pt with EF who requires  midodrine to maintain BP is this true "functional" gradient? Cards will need to determine/opine). 4. 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 Pt had left thigh graft revised 10/1 and reroutened the arterial half of the loop and on 11/5 a short segment of the infected exposed nonfunctional AVGG materal was removed. When seen by Dr. Kellie Simmering 11/24, there  was no drainage. On admission the wound was draining purulent material from two holes.  Pharmacy to dose Vancomycin and Tressie Ellis will also be given. Gram stain negative. Culture 11/27 with few staph. Sensitivity pending. Dr. Bridgett Larsson now says that plans only wet:dry dressings after bedside I and D 11/28. Continue ATB's for now pending final cultures 5. ESRD - MWF - K up to 5.8 today. Treat with Kayaxelate today. First round HD tomorrow prior to cath for K and volume 6. Chronic hypotension /volume - on midodrine 10 tid for BP support - always given pre HD 7. Anemia - Hgb trending down since October - could be related to chronic infected graft, lower Aranesp dose, meyelodysplasitc syndrome; Aranesp increased from 120 to 160 11/18 and 200 for 12/2 - received 160 this week; last tsat 38% 11/18- followed by Dr. Alvy Bimler with guidelines to transfuse when Hgb < 8; transfused 1 unit with HD yesterday - only increased to 8.7. I have low threshold for transfusion in this pt until we see what his cardiac situation is. Tentatively plan for another 2 units on Monday with HD as Hb right back down to 8 today despite 1 unit on 11/27. Continue Aranesp 200/week  8. Metabolic bone disease - has been on 3.5 Ca bath, sensipar, renvela,  Hectorol with last iPTH of 308 in October Ca/P controlled - not clear why on added Ca bath - Had 2.5 Ca bath w/HD 11/27 and follow while in hospital 9. Nutrition - no appetite the past few days - renal diet + supplements/vit 10. MDS - profound pancytopenia with Revlimid- on hold now; on weekly Granix; Dr. Alvy Bimler following 11. Hx CVA 12. Hx recurrent bladder cancer   Jamal Maes, MD [facility] [phone] pager 10/25/2014, 8:00 AM

## 2014-10-25 NOTE — Progress Notes (Signed)
ANTICOAGULATION CONSULT NOTE - Follow Up Consult  Pharmacy Consult for Heparin Indication: r/o ACS  No Known Allergies  Patient Measurements: Height: 5\' 8"  (172.7 cm) Weight: 164 lb 4.8 oz (74.526 kg) IBW/kg (Calculated) : 68.4 Heparin Dosing Weight: 75 kg  Vital Signs: Temp: 98.6 F (37 C) (11/29 1359) Temp Source: Oral (11/29 1359) BP: 135/60 mmHg (11/29 1359) Pulse Rate: 80 (11/29 1359)  Labs:  Recent Labs  10/23/14 1116  10/23/14 2226 10/24/14 0300  10/24/14 0947  10/25/14 0224 10/25/14 0227 10/25/14 0916 10/25/14 1845  HGB 8.0*  --   --  8.7*  --   --   --  8.0*  --   --   --   HCT 25.5*  --   --  26.7*  --   --   --  25.2*  --   --   --   PLT 107*  --   --  84*  --   --   --  76*  --   --   --   HEPARINUNFRC  --   --   --   --   < >  --   < >  --  0.19* 0.43 0.24*  CREATININE 8.30*  --   --  4.49*  --   --   --  6.72*  --   --   --   TROPONINI  --   < > <0.30 <0.30  --  <0.30  --   --   --   --   --   < > = values in this interval not displayed.  Estimated Creatinine Clearance: 7.6 mL/min (by C-G formula based on Cr of 6.72).   Assessment: 80yo male with ESRD who presented with CP, on heparin for r/o ACS, cardiology consulted, plans for cath Monday AM.  Noted pt with h/o anemia/myelodysplastic syndrome followed by hem/onc. Hgb 8 upon admission to receive PRBC x 1 with HD 11/27. Plt 107>>84 (pt with chronic thrombocytopenia 66-93 over past month).  Heparin level still low despite earlier increase in heparin drip rate.  No bleeding or complications noted.  Goal of Therapy:  Heparin level 0.3-0.7 units/ml Monitor platelets by anticoagulation protocol: Yes   Plan:  Increase heparin gtt to 1700 units/hr Check 8hr heparin level tomorrow with AM labs. Monitor CBC, PLT, and s/sx of bleeding Monitor heparin level daily  Uvaldo Rising, BCPS  Clinical Pharmacist Pager [phone]  10/25/2014 7:41 PM

## 2014-10-25 NOTE — Progress Notes (Signed)
Subjective: Pt sleeping initially- 10 am, daughter at bedside.  Went back, patient says he is not doing fine. Has cough- 3 days, productive of milky sputum. Also with SOB. Pt says he gets tired with minimal exertion. Denies chest pain at the time. No dysuria, or abdominal pain.   Objective: Vital signs in last 24 hours: Filed Vitals:   10/24/14 1300 10/24/14 1447 10/24/14 2211 10/25/14 0457  BP: 123/62  151/63 140/55  Pulse:  87 80 85  Temp:  98.7 F (37.1 C) 98.6 F (37 C) 98.4 F (36.9 C)  TempSrc:  Oral Oral Oral  Resp:  18 18 18   Height:      Weight:      SpO2:  97% 100% 96%   Weight change:   Intake/Output Summary (Last 24 hours) at 10/25/14 1018 Last data filed at 10/24/14 2102  Gross per 24 hour  Intake    360 ml  Output      0 ml  Net    360 ml   Physical Exam-  General: Sitting in recliner, comfortable, NAD, hard of hearing HEENT: No facial assym, AT, [place] Cardiac: RRR, no added sounds Pulm: Normal work of breathing on exam, no wheezes, rales, or rhonchi Abd: soft, nondistended, BS present Ext: warm and well perfused, no pedal edema Neuro: Alert and oriented  Lab Results: Basic Metabolic Panel:  Recent Labs Lab 10/24/14 0300 10/25/14 0224  NA 136* 135*  K 5.1 5.6*  CL 97 96  CO2 25 26  GLUCOSE 81 91  BUN 29* 51*  CREATININE 4.49* 6.72*  CALCIUM 8.3* 7.6*  PHOS 2.6 2.9   Liver Function Tests:  Recent Labs Lab 10/24/14 0300 10/25/14 0224  ALBUMIN 3.1* 2.9*   CBC:  Recent Labs Lab 10/20/14 1052  10/24/14 0300 10/25/14 0224  WBC 1.7*  < > 2.8* 2.2*  NEUTROABS 0.4*  --   --   --   HGB 8.2*  < > 8.7* 8.0*  HCT 25.8*  < > 26.7* 25.2*  MCV 112.9*  < > 109.0* 108.2*  PLT 82*  < > 84* 76*  < > = values in this interval not displayed.  Micro Results: Recent Results (from the past 240 hour(s))  Culture, blood (routine x 2)     Status: None (Preliminary result)   Collection Time: 10/23/14  1:20 PM  Result Value Ref Range Status   Specimen Description BLOOD DRAWN BY DIALYSIS  Final   Special Requests BOTTLES DRAWN AEROBIC AND ANAEROBIC 10CC  Final   Culture  Setup Time   Final    10/23/2014 22:43 Performed at [facility]    Culture   Final           BLOOD CULTURE RECEIVED NO GROWTH TO DATE CULTURE WILL BE HELD FOR 5 DAYS BEFORE ISSUING A FINAL NEGATIVE REPORT Performed at [facility]    Report Status PENDING  Incomplete  Wound culture     Status: None (Preliminary result)   Collection Time: 10/23/14  3:10 PM  Result Value Ref Range Status   Specimen Description WOUND LEFT THIGH  Final   Special Requests PT RECEIVED VANC ON 11/23  Final   Gram Stain   Final    FEW WBC PRESENT,BOTH PMN AND MONONUCLEAR NO SQUAMOUS EPITHELIAL CELLS SEEN NO ORGANISMS SEEN Performed at [facility]    Culture   Final    FEW STAPHYLOCOCCUS AUREUS Note: RIFAMPIN AND GENTAMICIN SHOULD NOT BE USED AS SINGLE DRUGS  FOR TREATMENT OF STAPH INFECTIONS. Performed at [facility]    Report Status PENDING  Incomplete  Culture, blood (routine x 2)     Status: None (Preliminary result)   Collection Time: 10/23/14  3:10 PM  Result Value Ref Range Status   Specimen Description BLOOD  Final   Special Requests BOTTLES DRAWN AEROBIC AND ANAEROBIC 10CC AVG  Final   Culture  Setup Time   Final    10/23/2014 22:42 Performed at [facility]    Culture   Final           BLOOD CULTURE RECEIVED NO GROWTH TO DATE CULTURE WILL BE HELD FOR 5 DAYS BEFORE ISSUING A FINAL NEGATIVE REPORT Performed at [facility]    Report Status PENDING  Incomplete   Studies/Results: Dg Chest Port 1 View  10/24/2014   CLINICAL DATA:  Shortness of breath  EXAM: PORTABLE CHEST - 1 VIEW  COMPARISON:  10/23/2014  FINDINGS: Mild patchy opacity in the right upper and lower lobes, worrisome for multifocal pneumonia, less likely asymmetric interstitial edema. No pleural effusion or pneumothorax.  Cardiomegaly.  Postsurgical  changes related to prior CABG.  Stable right axillary stent.  IMPRESSION: Mild patchy opacity in the right upper and lower lobes, worrisome for multifocal pneumonia, less likely asymmetric interstitial edema.   Electronically Signed   By: Julian Hy M.D.   On: 10/24/2014 13:50   Dg Chest Port 1 View  10/23/2014   CLINICAL DATA:  80 year old with shortness of breath and cough  EXAM: PORTABLE CHEST - 1 VIEW  COMPARISON:  04/21/2014  FINDINGS: Median sternotomy wires, surgical clips and CABG markers are present. Metallic clips project in the left axilla. A vascular stent projects over the right shoulder.  The cardiac silhouette is enlarged. The mediastinal contours are within normal limits. Atherosclerotic aortic calcifications are present.  Interstitial prominence is present in the lung bases, right slightly greater than left. There is no pleural effusion on the right. The left costophrenic angle is excluded from the field of view, limiting evaluation. There is no pneumothorax.  No acute osseous abnormality is identified.  IMPRESSION: 1. Mild cardiomegaly with probable mild interstitial pulmonary edema. Interstitial pneumonitis could have a similar appearance. Clinical correlation is recommended. 2. Prior CABG.   Electronically Signed   By: Rosemarie Ax   On: 10/23/2014 12:09    Medications: I have reviewed the patient's current medications. Scheduled Meds: . allopurinol  100 mg Oral BID  . aspirin EC  81 mg Oral QHS  . atorvastatin  10 mg Oral q1800  . cefTAZidime (FORTAZ)  IV  2 g Intravenous Q M,W,F-HD  . cinacalcet  30 mg Oral Q breakfast  . [START ON 10/26/2014] darbepoetin (ARANESP) injection - DIALYSIS  200 mcg Intravenous Q Mon-HD  . doxercalciferol  3 mcg Intravenous Q M,W,F-HD  . feeding supplement (NEPRO CARB STEADY)  237 mL Oral BID BM  . gabapentin  100 mg Oral Daily  . gabapentin  200 mg Oral QHS  . heparin  4,000 Units Intravenous Once  . midodrine  10 mg Oral TID WC  .  polyethylene glycol  17 g Oral QHS  . sevelamer carbonate  2,400 mg Oral TID WC  . sodium polystyrene  30 g Oral Once  . vancomycin  1,000 mg Intravenous Q M,W,F-HD   Continuous Infusions: . heparin 1,550 Units/hr (10/25/14 0341)   PRN Meds:.sodium chloride, sodium chloride, albuterol, docusate sodium, feeding supplement (NEPRO CARB STEADY), heparin, heparin, lidocaine (  PF), lidocaine-prilocaine, pentafluoroprop-tetrafluoroeth, sevelamer carbonate Assessment/Plan:  Mr. Vanatta is an 80 year old with multiple medical co-morbidities hospitalized for chest pain found to have graft site infection.  Chest pain: Troponins negative x 3. EKG findings otherwise stable. Scheduled for cath on Monday. -Continue ASA 81mg  & Lipitor 10mg  -Continue heparin per Cardiology, monitor Platelets -Cardiology following, appreciate recs  Recurrent graft site infections: Afebrile overnight. Vascular assessed it to be a superficial abscess with subsequent I&D. Recs- Wet to ry dressings.  -Continue Tressie Ellis & vancomycin (Day 3) -Cultures negative x 1 day- Prelim- Stap A. Await more results, consider d/c ceftaz.  -Continue wet-to-dry dressing BID per Vascular -Continue assessing for signs of infection  ESRD: Nephrology following, appreciate recs -Continue home Sevelamer, Hectorol, mitodrine, Nepro  MDS: Pancytopenia related to this condition, and treatment side effects of Renvelid. WBC low- 2.2 today, but about pt baseline. WBC 5.5- 11/27 likely an error.  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 Wt 164, down from 169 on admission. Net -2L on admission. Echo- 11/28- 45%, akinesis of the basal-midinferolateral and inferior myocardium - Daily weights - Cath tomorrow.  Infiltrates on chest xray- Impression- ?xtifocal PNA, WBC- at baseline, temp max- 99.2 2 days ago. Pt on VAnc and Fortaz- day 3. Immunocompromised. Pt with productive cough and SOB. Pt  meets criteria for HCAP as he is on HD. Will cont Vanc and fortax for now. - Sputum cultures to r/o Pseudomonas as pt is immunocompromised - Consider covering atypicals with Azithro if no improvement.   Gout: Continue allopurinol 100mg .  HLD: Continue statin as noted above.  Peripheral neuropathy: Continue home gabapentin.  Presumed COPD: No PFTs on file though will continue home albuterol.  #FEN:  -Diet: Renal  #DVT prophylaxis: heparin 5000 units subcutaneous  #CODE STATUS: FULL CODE -Confirmed with him at bedside on admission   Dispo: Disposition is deferred at this time, awaiting improvement of current medical problems.    The patient does have a current PCP Redmond School, MD) and does not need an [facility] hospital follow-up appointment after discharge.  The patient does have transportation limitations that hinder transportation to clinic appointments.  .Services Needed at time of discharge: Y = Yes, Blank = No PT:   OT:   RN:   Equipment:   Other:     LOS: 2 days   Bethena Roys, MD 10/25/2014, 10:18 AM

## 2014-10-25 NOTE — Plan of Care (Signed)
Problem: Phase I Progression Outcomes Goal: Hemodynamically stable Outcome: Progressing  Problem: Phase II Progression Outcomes Goal: Hemodynamically stable Outcome: Progressing

## 2014-10-25 NOTE — Progress Notes (Signed)
Left anterior thigh wound dressing changed. Scant amount SS drainage noted on old dressing. Tissue surrounding wound is firm. Scant amount of purulent drainage expressed from superior aspect of wound. Wound bed is beefy red with active bleeding when old dressing removed. Redressed with moist gauze.

## 2014-10-25 NOTE — Progress Notes (Signed)
   Daily Progress Note  Assessment/Planning: S/p L thigh AVG segment excision   Cavity is already trying to seal  Continue wet-to-dry dressing BID  Available as needed  Pt should follow up with Dr. Kellie Simmering in 2 weeks  Subjective    No c/o  Objective Filed Vitals:   10/24/14 1300 10/24/14 1447 10/24/14 2211 10/25/14 0457  BP: 123/62  151/63 140/55  Pulse:  87 80 85  Temp:  98.7 F (37.1 C) 98.6 F (37 C) 98.4 F (36.9 C)  TempSrc:  Oral Oral Oral  Resp:  18 18 18   Height:      Weight:      SpO2:  97% 100% 96%    Intake/Output Summary (Last 24 hours) at 10/25/14 0853 Last data filed at 10/24/14 2102  Gross per 24 hour  Intake    480 ml  Output      0 ml  Net    480 ml   VASC  L thigh: abscess cavity clean without any pus, cavity trying to close already, serosang drainage on dressing  Laboratory CBC    Component Value Date/Time   WBC 2.2* 10/25/2014 0224   WBC 1.7* 10/20/2014 1052   HGB 8.0* 10/25/2014 0224   HGB 8.2* 10/20/2014 1052   HCT 25.2* 10/25/2014 0224   HCT 25.8* 10/20/2014 1052   PLT 76* 10/25/2014 0224   PLT 82* 10/20/2014 1052    BMET    Component Value Date/Time   NA 135* 10/25/2014 0224   NA 142 08/26/2013 1119   K 5.6* 10/25/2014 0224   K 4.1 08/26/2013 1119   K 6.0 03/23/2011 0912   CL 96 10/25/2014 0224   CO2 26 10/25/2014 0224   CO2 33* 08/26/2013 1119   GLUCOSE 91 10/25/2014 0224   GLUCOSE 104 08/26/2013 1119   BUN 51* 10/25/2014 0224   BUN 26.8* 08/26/2013 1119   CREATININE 6.72* 10/25/2014 0224   CREATININE 5.3* 08/26/2013 1119   CALCIUM 7.6* 10/25/2014 0224   CALCIUM 8.1* 08/26/2013 1119   CALCIUM 7.8* 03/13/2012 0527   GFRNONAA 7* 10/25/2014 0224   GFRAA 8* 10/25/2014 0224    Adele Barthel, MD [facility]: [phone] Pager: [phone]  10/25/2014, 8:53 AM

## 2014-10-26 ENCOUNTER — Telehealth: Payer: Self-pay | Admitting: *Deleted

## 2014-10-26 DIAGNOSIS — E785 Hyperlipidemia, unspecified: Secondary | ICD-10-CM

## 2014-10-26 DIAGNOSIS — M109 Gout, unspecified: Secondary | ICD-10-CM

## 2014-10-26 DIAGNOSIS — E875 Hyperkalemia: Secondary | ICD-10-CM

## 2014-10-26 DIAGNOSIS — D469 Myelodysplastic syndrome, unspecified: Secondary | ICD-10-CM

## 2014-10-26 DIAGNOSIS — N186 End stage renal disease: Secondary | ICD-10-CM

## 2014-10-26 DIAGNOSIS — I25119 Atherosclerotic heart disease of native coronary artery with unspecified angina pectoris: Secondary | ICD-10-CM

## 2014-10-26 DIAGNOSIS — D61818 Other pancytopenia: Secondary | ICD-10-CM

## 2014-10-26 DIAGNOSIS — I5040 Unspecified combined systolic (congestive) and diastolic (congestive) heart failure: Secondary | ICD-10-CM

## 2014-10-26 DIAGNOSIS — G629 Polyneuropathy, unspecified: Secondary | ICD-10-CM

## 2014-10-26 DIAGNOSIS — A4902 Methicillin resistant Staphylococcus aureus infection, unspecified site: Secondary | ICD-10-CM

## 2014-10-26 DIAGNOSIS — J449 Chronic obstructive pulmonary disease, unspecified: Secondary | ICD-10-CM

## 2014-10-26 DIAGNOSIS — J189 Pneumonia, unspecified organism: Secondary | ICD-10-CM

## 2014-10-26 DIAGNOSIS — R509 Fever, unspecified: Secondary | ICD-10-CM

## 2014-10-26 LAB — PROTIME-INR
INR: 1.22 (ref 0.00–1.49)
PROTHROMBIN TIME: 15.6 s — AB (ref 11.6–15.2)

## 2014-10-26 LAB — RENAL FUNCTION PANEL
ALBUMIN: 2.8 g/dL — AB (ref 3.5–5.2)
ANION GAP: 17 — AB (ref 5–15)
Albumin: 2.8 g/dL — ABNORMAL LOW (ref 3.5–5.2)
Anion gap: 17 — ABNORMAL HIGH (ref 5–15)
BUN: 64 mg/dL — ABNORMAL HIGH (ref 6–23)
BUN: 67 mg/dL — ABNORMAL HIGH (ref 6–23)
CHLORIDE: 95 meq/L — AB (ref 96–112)
CHLORIDE: 95 meq/L — AB (ref 96–112)
CO2: 25 mEq/L (ref 19–32)
CO2: 24 meq/L (ref 19–32)
Calcium: 7.5 mg/dL — ABNORMAL LOW (ref 8.4–10.5)
Calcium: 7.5 mg/dL — ABNORMAL LOW (ref 8.4–10.5)
Creatinine, Ser: 8.7 mg/dL — ABNORMAL HIGH (ref 0.50–1.35)
Creatinine, Ser: 9.15 mg/dL — ABNORMAL HIGH (ref 0.50–1.35)
GFR calc Af Amer: 6 mL/min — ABNORMAL LOW (ref >90)
GFR calc non Af Amer: 5 mL/min — ABNORMAL LOW (ref >90)
GFR, EST AFRICAN AMERICAN: 5 mL/min — AB (ref >90)
GFR, EST NON AFRICAN AMERICAN: 5 mL/min — AB (ref >90)
Glucose, Bld: 79 mg/dL (ref 70–99)
Glucose, Bld: 114 mg/dL — ABNORMAL HIGH (ref 70–99)
POTASSIUM: 5.7 meq/L — AB (ref 3.7–5.3)
Phosphorus: 3.5 mg/dL (ref 2.3–4.6)
Phosphorus: 3.6 mg/dL (ref 2.3–4.6)
Potassium: 5.3 mEq/L (ref 3.7–5.3)
SODIUM: 137 meq/L (ref 137–147)
SODIUM: 136 meq/L — AB (ref 137–147)

## 2014-10-26 LAB — CBC
HCT: 24.3 % — ABNORMAL LOW (ref 39.0–52.0)
HCT: 24.1 % — ABNORMAL LOW (ref 39.0–52.0)
HEMOGLOBIN: 7.8 g/dL — AB (ref 13.0–17.0)
Hemoglobin: 7.7 g/dL — ABNORMAL LOW (ref 13.0–17.0)
MCH: 34.7 pg — ABNORMAL HIGH (ref 26.0–34.0)
MCH: 34.5 pg — ABNORMAL HIGH (ref 26.0–34.0)
MCHC: 32.1 g/dL (ref 30.0–36.0)
MCHC: 32 g/dL (ref 30.0–36.0)
MCV: 108 fL — ABNORMAL HIGH (ref 78.0–100.0)
MCV: 108.1 fL — ABNORMAL HIGH (ref 78.0–100.0)
PLATELETS: 76 10*3/uL — AB (ref 150–400)
Platelets: 80 10*3/uL — ABNORMAL LOW (ref 150–400)
RBC: 2.25 MIL/uL — ABNORMAL LOW (ref 4.22–5.81)
RBC: 2.23 MIL/uL — ABNORMAL LOW (ref 4.22–5.81)
RDW: 18.7 % — ABNORMAL HIGH (ref 11.5–15.5)
RDW: 18.7 % — AB (ref 11.5–15.5)
WBC: 1.8 10*3/uL — AB (ref 4.0–10.5)
WBC: 1.7 10*3/uL — AB (ref 4.0–10.5)

## 2014-10-26 LAB — HEPARIN LEVEL (UNFRACTIONATED): HEPARIN UNFRACTIONATED: 0.34 [IU]/mL (ref 0.30–0.70)

## 2014-10-26 LAB — WOUND CULTURE

## 2014-10-26 LAB — VANCOMYCIN, RANDOM
VANCOMYCIN RM: 15.3 ug/mL
Vancomycin Rm: 14.9 ug/mL

## 2014-10-26 LAB — PREPARE RBC (CROSSMATCH)

## 2014-10-26 LAB — HEPATITIS B SURFACE ANTIGEN: HEP B S AG: NEGATIVE

## 2014-10-26 MED ORDER — MIDODRINE HCL 5 MG PO TABS
ORAL_TABLET | ORAL | Status: AC
  Administered 2014-10-26: 10 mg via ORAL
  Filled 2014-10-26: qty 2

## 2014-10-26 MED ORDER — DARBEPOETIN ALFA 200 MCG/0.4ML IJ SOSY
PREFILLED_SYRINGE | INTRAMUSCULAR | Status: AC
  Administered 2014-10-26: 200 ug via INTRAVENOUS
  Filled 2014-10-26: qty 0.4

## 2014-10-26 MED ORDER — SODIUM CHLORIDE 0.9 % IV SOLN
Freq: Once | INTRAVENOUS | Status: AC
  Administered 2014-10-27: 08:00:00 via INTRAVENOUS

## 2014-10-26 MED ORDER — VANCOMYCIN HCL 1000 MG IV SOLR
1250.0000 mg | INTRAVENOUS | Status: DC
  Administered 2014-10-27: 1250 mg via INTRAVENOUS
  Filled 2014-10-26 (×3): qty 1250

## 2014-10-26 MED ORDER — DOXERCALCIFEROL 4 MCG/2ML IV SOLN
INTRAVENOUS | Status: AC
  Administered 2014-10-26: 3 ug via INTRAVENOUS
  Filled 2014-10-26: qty 2

## 2014-10-26 MED ORDER — DEXTROSE 5 % IV SOLN
2.0000 g | INTRAVENOUS | Status: DC
  Administered 2014-10-26: 2 g via INTRAVENOUS
  Filled 2014-10-26: qty 2

## 2014-10-26 NOTE — Progress Notes (Signed)
Pt seen and examined with Dr. Posey Pronto. I agree with findings and documentation as outlined in his note. Pt feels well today. Denies chest pain  CP: - Pt for cath later today or in AM (was scheduled for today but needed to be dialyzed and transfused prior to procedure) - c/w asa, statin, heparin gtt per cardiology  MRSA graft site infection: - c/w vancomycin for now - c/w dressings per vascular  Pneumonia: - c/w vancomycin. Would consider d/c fortaz and start ceftriaxone  ESRD: - c/w HD per renal  MDS: - pt to be transfused 2 units PRBC at HD today - Revlamid on hold  Dr. Jillene Bucks to assume care in AM

## 2014-10-26 NOTE — Progress Notes (Signed)
Medicare Important Message given? YES  (If response is "NO", the following Medicare IM given date fields will be blank)  Date Medicare IM given: 10/26/14 Medicare IM given by:  Dahlia Client [employer]

## 2014-10-26 NOTE — Progress Notes (Signed)
Subjective: Deneis CP  NO SOB   Objective: Filed Vitals:   10/25/14 1359 10/25/14 2100 10/26/14 0419 10/26/14 0430  BP: 135/60 132/48 150/55   Pulse: 80 80 85   Temp: 98.6 F (37 C) 98 F (36.7 C) 98.4 F (36.9 C)   TempSrc: Oral Oral Oral   Resp: 18 18 20    Height:    5\' 8"  (1.727 m)  Weight:    172 lb 6.4 oz (78.2 kg)  SpO2: 100% 96% 95%    Weight change:   Intake/Output Summary (Last 24 hours) at 10/26/14 3570 Last data filed at 10/25/14 1300  Gross per 24 hour  Intake    240 ml  Output      0 ml  Net    240 ml    General: Sleeping initially then awake, oriented x3, in no acute distress Neck:  JVP is normal Heart: Regular rate and rhythm II/VI systolic murmur LSB to base  , rubs, gallops.  Lungs: Clear to auscultation.  No rales or wheezes. Exemities:  No edema.   Neuro: Grossly intact, nonfocal.   Lab Results: Results for orders placed or performed during the hospital encounter of 10/23/14 (from the past 24 hour(s))  Heparin level (unfractionated)     Status: None   Collection Time: 10/25/14  9:16 AM  Result Value Ref Range   Heparin Unfractionated 0.43 0.30 - 0.70 IU/mL  Heparin level (unfractionated)     Status: Abnormal   Collection Time: 10/25/14  6:45 PM  Result Value Ref Range   Heparin Unfractionated 0.24 (L) 0.30 - 0.70 IU/mL  CBC     Status: Abnormal   Collection Time: 10/26/14  3:55 AM  Result Value Ref Range   WBC 1.8 (L) 4.0 - 10.5 K/uL   RBC 2.25 (L) 4.22 - 5.81 MIL/uL   Hemoglobin 7.8 (L) 13.0 - 17.0 g/dL   HCT 24.3 (L) 39.0 - 52.0 %   MCV 108.0 (H) 78.0 - 100.0 fL   MCH 34.7 (H) 26.0 - 34.0 pg   MCHC 32.1 30.0 - 36.0 g/dL   RDW 18.7 (H) 11.5 - 15.5 %   Platelets 80 (L) 150 - 400 K/uL  Heparin level (unfractionated)     Status: None   Collection Time: 10/26/14  3:55 AM  Result Value Ref Range   Heparin Unfractionated 0.34 0.30 - 0.70 IU/mL  Vancomycin, random     Status: None   Collection Time: 10/26/14  3:55 AM  Result Value Ref  Range   Vancomycin Rm 15.3 ug/mL  Renal function panel     Status: Abnormal   Collection Time: 10/26/14  3:55 AM  Result Value Ref Range   Sodium 137 137 - 147 mEq/L   Potassium 5.7 (H) 3.7 - 5.3 mEq/L   Chloride 95 (L) 96 - 112 mEq/L   CO2 25 19 - 32 mEq/L   Glucose, Bld 79 70 - 99 mg/dL   BUN 64 (H) 6 - 23 mg/dL   Creatinine, Ser 8.70 (H) 0.50 - 1.35 mg/dL   Calcium 7.5 (L) 8.4 - 10.5 mg/dL   Phosphorus 3.5 2.3 - 4.6 mg/dL   Albumin 2.8 (L) 3.5 - 5.2 g/dL   GFR calc non Af Amer 5 (L) >90 mL/min   GFR calc Af Amer 6 (L) >90 mL/min   Anion gap 17 (H) 5 - 15  Protime-INR     Status: Abnormal   Collection Time: 10/26/14  3:55 AM  Result Value Ref Range  Prothrombin Time 15.6 (H) 11.6 - 15.2 seconds   INR 1.22 0.00 - 1.49    Studies/Results: No results found.  Medications: Reviewed   @PROBHOSP @  1.  CAD  Patient with [place] with kn own  Severe CAD as noted in [place]    Echo with LVEF of 45% witn akinesis of base/mid inferior/inferolateral walls.  Plan for cath today but K high and hgb low.  Will have dialysis first.  Reassess for later this pm or AM for cath.    2.  Aortic stensois.  Echo on 11/28 AS appears moderate by echo    3.  MDS.  Patient with anemia that initally responded to Revlimid  Was seen last week in clinic  Plan to restart once ANC improved.  4.  Neutropenia  5.  ESRD.  Dialysis this am    LOS: 3 days   Dorris Carnes 10/26/2014, 8:12 AM

## 2014-10-26 NOTE — Progress Notes (Signed)
OT Cancellation Note  Patient Details Name: JOANDRY SLAGTER MRN: [id number] DOB: [birth year]   Cancelled Treatment:    Reason Eval/Treat Not Completed: Medical issues which prohibited therapy - pt on strict bedrest.  Will initiate OT once activity increased.  Darlina Rumpf [place], OTR/L [phone]  10/26/2014, 10:51 AM

## 2014-10-26 NOTE — Progress Notes (Signed)
PT Cancellation Note  Patient Details Name: Andrew [name] MRN: [id number] DOB: 09-24-1926   Cancelled Treatment:    Reason Eval/Treat Not Completed: Patient at procedure or test/unavailable (pt leaving for HD will attempt next date)   [name] 10/26/2014, 11:23 AM [name], [place]

## 2014-10-26 NOTE — Progress Notes (Signed)
PT Cancellation Note  Patient Details Name: Andrew [name] MRN: [id number] DOB: 1926/03/16   Cancelled Treatment:    Reason Eval/Treat Not Completed: Medical issues which prohibited therapy (pt currently on strict bedrest, with elevated potassium. Await increased activity order. )   [name] 10/26/2014, 7:29 AM [name], [place]

## 2014-10-26 NOTE — Progress Notes (Signed)
[facility] Progress Note   Subjective: No complaints  Filed Vitals:   10/25/14 1359 10/25/14 2100 10/26/14 0419 10/26/14 0430  BP: 135/60 132/48 150/55   Pulse: 80 80 85   Temp: 98.6 F (37 C) 98 F (36.7 C) 98.4 F (36.9 C)   TempSrc: Oral Oral Oral   Resp: 18 18 20    Height:    5\' 8"  (1.727 m)  Weight:    78.2 kg (172 lb 6.4 oz)  SpO2: 100% 96% 95%    Exam: Alert, elderly, chronically ill-appearing, no distress No jvd Chest clear bilat RRR 2/6 SEM, no RG Abd soft, NTND, +BS No LE edema, L thigh AVG +bruit medial L thigh Small wound lateral L ant thigh is packed, no erythema or drainage Neuro is nf, ox 3  HD: MWF [place] F160   77.5kg   2/3.5 Bath   L thigh AVG   Heparin 2000 Aranesp 200 / wk start 12/2 (was 160), Hectorol 3 ug, no Fe PTH 308, Ca/P wnl       Assessment: 1. CP / hx CABG - for cath later today or tomorrow. Trop's flat 2. Recurrent L thigh AVG infection, s/p revision 10/1 - had bedside I&D 11/28, on IV vanc/fortaz pending cx results, f/b Dr Bridgett Larsson; this is infection of old nonfunctional AVG material 3. ESRD on HD 4. HTN/ volume - chronic hypotension on midodrine, up 1kg, stable, no vol excess 5. Anemia of CKD - prob ESA resistance due to chronic infection, Hb 7.8, to get 2u prbc with HD today, cont max ESA 6. MBD - cont vit D, sensipar, renvela.  PTH down to 308 in Oct, Ca/P ok.  3.5 Ca bath dec'd to 2.5 by Dr Lorrene Reid here 7. MDS - profound pancytopenia from Revlimid, on hold now. Weekly Granix, Dr Alvy Bimler following 8. Hx CVA 9. Hx recurrent bladder cancer   Plan- HD today, give 2u prbc's, heart cath today or tomorrow per cardiology    Kelly Splinter MD  pager [phone]    cell [phone]  10/26/2014, 10:03 AM     Recent Labs Lab 10/24/14 0300 10/25/14 0224 10/26/14 0355  NA 136* 135* 137  K 5.1 5.6* 5.7*  CL 97 96 95*  CO2 25 26 25   GLUCOSE 81 91 79  BUN 29* 51* 64*  CREATININE 4.49* 6.72* 8.70*  CALCIUM 8.3* 7.6* 7.5*   PHOS 2.6 2.9 3.5    Recent Labs Lab 10/24/14 0300 10/25/14 0224 10/26/14 0355  ALBUMIN 3.1* 2.9* 2.8*    Recent Labs Lab 10/20/14 1052  10/24/14 0300 10/25/14 0224 10/26/14 0355  WBC 1.7*  < > 2.8* 2.2* 1.8*  NEUTROABS 0.4*  --   --   --   --   HGB 8.2*  < > 8.7* 8.0* 7.8*  HCT 25.8*  < > 26.7* 25.2* 24.3*  MCV 112.9*  < > 109.0* 108.2* 108.0*  PLT 82*  < > 84* 76* 80*  < > = values in this interval not displayed. Marland Kitchen allopurinol  100 mg Oral BID  . aspirin EC  81 mg Oral QHS  . atorvastatin  10 mg Oral q1800  . cefTAZidime (FORTAZ)  IV  2 g Intravenous Q M,W,F-HD  . cinacalcet  30 mg Oral Q breakfast  . darbepoetin (ARANESP) injection - DIALYSIS  200 mcg Intravenous Q Mon-HD  . doxercalciferol  3 mcg Intravenous Q M,W,F-HD  . feeding supplement (NEPRO CARB STEADY)  237 mL Oral BID BM  . gabapentin  100  mg Oral Daily  . gabapentin  200 mg Oral QHS  . heparin  4,000 Units Intravenous Once  . midodrine  10 mg Oral TID WC  . polyethylene glycol  17 g Oral QHS  . sevelamer carbonate  2,400 mg Oral TID WC  . sodium chloride  3 mL Intravenous Q12H  . vancomycin  1,250 mg Intravenous Q M,W,F-HD   . sodium chloride    . heparin 1,700 Units/hr (10/26/14 0740)   sodium chloride, sodium chloride, albuterol, docusate sodium, feeding supplement (NEPRO CARB STEADY), heparin, heparin, lidocaine (PF), lidocaine-prilocaine, pentafluoroprop-tetrafluoroeth, sevelamer carbonate

## 2014-10-26 NOTE — Progress Notes (Addendum)
ANTICOAGULATION / Antibiotic CONSULT NOTE - Follow Up Consult  Pharmacy Consult for Heparin / Vancomycin / Tressie Ellis Indication: r/o ACS / thigh graft infection --> as an outpt  No Known Allergies  Patient Measurements: Height: 5\' 8"  (172.7 cm) Weight: 172 lb 6.4 oz (78.2 kg) IBW/kg (Calculated) : 68.4 Heparin Dosing Weight: 75 kg  Vital Signs: Temp: 98.4 F (36.9 C) (11/30 0419) Temp Source: Oral (11/30 0419) BP: 150/55 mmHg (11/30 0419) Pulse Rate: 85 (11/30 0419)  Labs:  Recent Labs  10/23/14 2226 10/24/14 0300  10/24/14 0947  10/25/14 0224  10/25/14 0916 10/25/14 1845 10/26/14 0355  HGB  --  8.7*  --   --   --  8.0*  --   --   --  7.8*  HCT  --  26.7*  --   --   --  25.2*  --   --   --  24.3*  PLT  --  84*  --   --   --  76*  --   --   --  80*  LABPROT  --   --   --   --   --   --   --   --   --  15.6*  INR  --   --   --   --   --   --   --   --   --  1.22  HEPARINUNFRC  --   --   < >  --   < >  --   < > 0.43 0.24* 0.34  CREATININE  --  4.49*  --   --   --  6.72*  --   --   --  8.70*  TROPONINI <0.30 <0.30  --  <0.30  --   --   --   --   --   --   < > = values in this interval not displayed.  Estimated Creatinine Clearance: 5.9 mL/min (by C-G formula based on Cr of 8.7).   Assessment: 80yo male with ESRD who presented with CP, on heparin for r/o ACS, cardiology consulted, plans for cath Monday AM.    Heparin level now therapeutic  Continues on Vancomycin and Fortaz for thigh graft infection, Vancomycin pre-HD level = 15.3 (low)  Goal of Therapy:  Heparin level 0.3-0.7 units/ml Monitor platelets by anticoagulation protocol: Yes   Plan:  Continue heparin at 1700 units / hr Follow up plans for cath today Increase Vancomycin to 1250 mg iv Q HD Continue Fortaz 2 grams iv Q HD Continue to follow  Thank you. Anette Guarneri, PharmD [phone]   10/26/2014

## 2014-10-26 NOTE — Progress Notes (Addendum)
Subjective: This AM, he is resting in bed with daughter at bedside. He denies any chest pain, dyspnea, drainage from his graft but wonders when he can eat. We explained that he is scheduled for his cardiac cath today which is pending dialysis. K 5.7 this AM after Kayexelate; wound cultures remarkable for MRSA.  Objective: Vital signs in last 24 hours: Filed Vitals:   10/25/14 1359 10/25/14 2100 10/26/14 0419 10/26/14 0430  BP: 135/60 132/48 150/55   Pulse: 80 80 85   Temp: 98.6 F (37 C) 98 F (36.7 C) 98.4 F (36.9 C)   TempSrc: Oral Oral Oral   Resp: 18 18 20    Height:    5\' 8"  (1.727 m)  Weight:    172 lb 6.4 oz (78.2 kg)  SpO2: 100% 96% 95%    Weight change:   Intake/Output Summary (Last 24 hours) at 10/26/14 1117 Last data filed at 10/25/14 1300  Gross per 24 hour  Intake    120 ml  Output      0 ml  Net    120 ml   Physical Exam  General: resting in bed, comfortable, NAD, hard of hearing HEENT: No facial assym, AT, [place] Cardiac: RRR, no added sounds Pulm: Normal work of breathing on exam, no wheezes, rales, or rhonchi Abd: soft, nondistended, BS present Ext: warm and well perfused, no pedal edema Neuro: Alert and oriented  Lab Results: Basic Metabolic Panel:  Recent Labs Lab 10/25/14 0224 10/26/14 0355  NA 135* 137  K 5.6* 5.7*  CL 96 95*  CO2 26 25  GLUCOSE 91 79  BUN 51* 64*  CREATININE 6.72* 8.70*  CALCIUM 7.6* 7.5*  PHOS 2.9 3.5   Liver Function Tests:  Recent Labs Lab 10/25/14 0224 10/26/14 0355  ALBUMIN 2.9* 2.8*   CBC:  Recent Labs Lab 10/20/14 1052  10/25/14 0224 10/26/14 0355  WBC 1.7*  < > 2.2* 1.8*  NEUTROABS 0.4*  --   --   --   HGB 8.2*  < > 8.0* 7.8*  HCT 25.8*  < > 25.2* 24.3*  MCV 112.9*  < > 108.2* 108.0*  PLT 82*  < > 76* 80*  < > = values in this interval not displayed.  Micro Results: Recent Results (from the past 240 hour(s))  Culture, blood (routine x 2)     Status: None (Preliminary result)   Collection  Time: 10/23/14  1:20 PM  Result Value Ref Range Status   Specimen Description BLOOD DRAWN BY DIALYSIS  Final   Special Requests BOTTLES DRAWN AEROBIC AND ANAEROBIC 10CC  Final   Culture  Setup Time   Final    10/23/2014 22:43 Performed at [facility]    Culture   Final           BLOOD CULTURE RECEIVED NO GROWTH TO DATE CULTURE WILL BE HELD FOR 5 DAYS BEFORE ISSUING A FINAL NEGATIVE REPORT Performed at [facility]    Report Status PENDING  Incomplete  Wound culture     Status: None   Collection Time: 10/23/14  3:10 PM  Result Value Ref Range Status   Specimen Description WOUND LEFT THIGH  Final   Special Requests PT RECEIVED VANC ON 11/23  Final   Gram Stain   Final    FEW WBC PRESENT,BOTH PMN AND MONONUCLEAR NO SQUAMOUS EPITHELIAL CELLS SEEN NO ORGANISMS SEEN Performed at [facility]    Culture   Final    FEW METHICILLIN RESISTANT  STAPHYLOCOCCUS AUREUS Note: RIFAMPIN AND GENTAMICIN SHOULD NOT BE USED AS SINGLE DRUGS FOR TREATMENT OF STAPH INFECTIONS. This organism DOES NOT demonstrate inducible Clindamycin resistance in vitro. CRITICAL RESULT CALLED TO, READ BACK BY AND VERIFIED WITH: Andrew [name] 11/30  @945  BY REAMM Performed at [facility]    Report Status 10/26/2014 FINAL  Final   Organism ID, Bacteria METHICILLIN RESISTANT STAPHYLOCOCCUS AUREUS  Final      Susceptibility   Methicillin resistant staphylococcus aureus - MIC*    CLINDAMYCIN <=0.25 SENSITIVE Sensitive     ERYTHROMYCIN >=8 RESISTANT Resistant     GENTAMICIN <=0.5 SENSITIVE Sensitive     LEVOFLOXACIN >=8 RESISTANT Resistant     OXACILLIN >=4 RESISTANT Resistant     PENICILLIN >=0.5 RESISTANT Resistant     RIFAMPIN <=0.5 SENSITIVE Sensitive     TRIMETH/SULFA <=10 SENSITIVE Sensitive     VANCOMYCIN 1 SENSITIVE Sensitive     TETRACYCLINE <=1 SENSITIVE Sensitive     * FEW METHICILLIN RESISTANT STAPHYLOCOCCUS AUREUS  Culture, blood (routine x 2)     Status: None (Preliminary  result)   Collection Time: 10/23/14  3:10 PM  Result Value Ref Range Status   Specimen Description BLOOD  Final   Special Requests BOTTLES DRAWN AEROBIC AND ANAEROBIC 10CC AVG  Final   Culture  Setup Time   Final    10/23/2014 22:42 Performed at [facility]    Culture   Final           BLOOD CULTURE RECEIVED NO GROWTH TO DATE CULTURE WILL BE HELD FOR 5 DAYS BEFORE ISSUING A FINAL NEGATIVE REPORT Performed at [facility]    Report Status PENDING  Incomplete   Studies/Results: Dg Chest Port 1 View  10/24/2014   CLINICAL DATA:  Shortness of breath  EXAM: PORTABLE CHEST - 1 VIEW  COMPARISON:  10/23/2014  FINDINGS: Mild patchy opacity in the right upper and lower lobes, worrisome for multifocal pneumonia, less likely asymmetric interstitial edema. No pleural effusion or pneumothorax.  Cardiomegaly.  Postsurgical changes related to prior CABG.  Stable right axillary stent.  IMPRESSION: Mild patchy opacity in the right upper and lower lobes, worrisome for multifocal pneumonia, less likely asymmetric interstitial edema.   Electronically Signed   By: Julian Hy M.[name].   On: 10/24/2014 13:50    Medications: I have reviewed the patient's current medications. Scheduled Meds: . allopurinol  100 mg Oral BID  . aspirin EC  81 mg Oral QHS  . atorvastatin  10 mg Oral q1800  . cefTAZidime (FORTAZ)  IV  2 g Intravenous Q M,W,F-HD  . cinacalcet  30 mg Oral Q breakfast  . darbepoetin (ARANESP) injection - DIALYSIS  200 mcg Intravenous Q Mon-HD  . doxercalciferol  3 mcg Intravenous Q M,W,F-HD  . feeding supplement (NEPRO CARB STEADY)  237 mL Oral BID BM  . gabapentin  100 mg Oral Daily  . gabapentin  200 mg Oral QHS  . heparin  4,000 Units Intravenous Once  . midodrine  10 mg Oral TID WC  . polyethylene glycol  17 g Oral QHS  . sevelamer carbonate  2,400 mg Oral TID WC  . sodium chloride  3 mL Intravenous Q12H  . vancomycin  1,250 mg Intravenous Q M,W,F-HD   Continuous  Infusions: . sodium chloride    . heparin 1,700 Units/hr (10/26/14 0740)   PRN Meds:.sodium chloride, sodium chloride, albuterol, docusate sodium, feeding supplement (NEPRO CARB STEADY), heparin, heparin, lidocaine (PF), lidocaine-prilocaine, pentafluoroprop-tetrafluoroeth, sevelamer carbonate  Assessment/Plan:  Andrew [name] is an 80 year old with multiple medical co-morbidities hospitalized for chest pain found to have MRSA graft site infection and possibly HCAP.  Chest pain: Scheduled for cath today pending dialysis. -Continue ASA 81mg  & Lipitor 10mg  -Continue heparin per Cardiology, monitor Platelets -Cardiology following, appreciate recs  Possible HCAP: Asymptomatic today, afebrile overnight though productive cough yesterday with questionable CXR infiltrates.  -Continue vancomycin & Fortaz (Day 4)  MRSA graft site infection: Afebrile overnight. Wound cultures remarkable for MRSA this AM. -Continue abx as possible -Continue wet-to-dry dressing BID per Vascular -Continue assessing for signs of infection  ESRD: Nephrology following, appreciate recs. K remains mildly elevated at 5.7 this AM but should resolve with planned dialysis today -Continue home Sevelamer, Hectorol, mitodrine, Nepro  MDS: Pancytopenia related to this condition, and treatment side effects of Renvelid.  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 Wt 172, up from 169 on admission. Net -1.3L on admission.  -Daily weights & strict I&Os as noted above -Cath today.  Gout: Continue allopurinol 100mg .  HLD: Continue statin as noted above.  Peripheral neuropathy: Continue home gabapentin.  Presumed COPD: No PFTs on file though will continue home albuterol.  #FEN:  -Diet: NPO   #DVT prophylaxis: heparin per Nephrology  #CODE STATUS:  FULL CODE -Confirmed with him at bedside on admission   Dispo: Disposition is deferred at this time, awaiting improvement of current medical problems.    The patient does have a current PCP Andrew School, MD) and does not need an [facility] hospital follow-up appointment after discharge.  The patient does have transportation limitations that hinder transportation to clinic appointments.  .Services Needed at time of discharge: Y = Yes, Blank = No PT:   OT:   RN:   Equipment:   Other:     LOS: 3 days   Charlott Rakes, MD 10/26/2014, 11:17 AM

## 2014-10-26 NOTE — Plan of Care (Signed)
Problem: Phase I Progression Outcomes Goal: Hemodynamically stable Outcome: Completed/Met Date Met:  10/26/14 Goal: Anginal pain relieved Outcome: Completed/Met Date Met:  10/26/14 Goal: Aspirin unless contraindicated Outcome: Completed/Met Date Met:  10/26/14 Goal: MD aware of Cardiac Marker results Outcome: Completed/Met Date Met:  10/26/14 Goal: Voiding-avoid urinary catheter unless indicated Outcome: Completed/Met Date Met:  10/26/14  Problem: Phase II Progression Outcomes Goal: Hemodynamically stable Outcome: Completed/Met Date Met:  10/26/14 Goal: Stress Test if indicated Outcome: Not Applicable Date Met:  27/12/92 Goal: Cath/PCI Day Path if indicated Outcome: Completed/Met Date Met:  10/26/14 Goal: CV Risk Factors identified Outcome: Completed/Met Date Met:  10/26/14 Goal: Cardiac Rehab if ordered Outcome: Not Applicable Date Met:  90/90/30 Goal: If positive for MI, change to MI Path Outcome: Not Applicable Date Met:  14/99/69  Problem: Consults Goal: Cardiac Cath Patient Education (See Patient Education module for education specifics.)  Outcome: Completed/Met Date Met:  10/26/14

## 2014-10-26 NOTE — Telephone Encounter (Signed)
Daughter left VM informing pt is in hospital at [facility] and needs to cancel his appt for lab/injection tomorrow.   Appts canceled for tomorrow.

## 2014-10-26 NOTE — Progress Notes (Signed)
OT Cancellation Note  Patient Details Name: Andrew [name] MRN: [id number] DOB: 10-30-1926   Cancelled Treatment:    Reason Eval/Treat Not Completed: Patient at procedure or test/ unavailable - Pt at HD.  Will reattempt  Darlina Rumpf [place], OTR/L [phone]  10/26/2014, 12:40 PM

## 2014-10-27 ENCOUNTER — Other Ambulatory Visit: Payer: Medicare Other

## 2014-10-27 ENCOUNTER — Encounter (HOSPITAL_COMMUNITY)
Admission: EM | Disposition: A | Discharge status: Skilled Nursing Facility | Payer: Self-pay | Source: Home / Self Care | Attending: Internal Medicine

## 2014-10-27 ENCOUNTER — Ambulatory Visit: Payer: Medicare Other

## 2014-10-27 DIAGNOSIS — D709 Neutropenia, unspecified: Secondary | ICD-10-CM

## 2014-10-27 DIAGNOSIS — I25110 Atherosclerotic heart disease of native coronary artery with unstable angina pectoris: Principal | ICD-10-CM

## 2014-10-27 HISTORY — PX: LEFT HEART CATHETERIZATION WITH CORONARY/GRAFT ANGIOGRAM: SHX5450

## 2014-10-27 LAB — BASIC METABOLIC PANEL
Anion gap: 15 (ref 5–15)
BUN: 28 mg/dL — ABNORMAL HIGH (ref 6–23)
CO2: 25 meq/L (ref 19–32)
CREATININE: 4.94 mg/dL — AB (ref 0.50–1.35)
Calcium: 7.7 mg/dL — ABNORMAL LOW (ref 8.4–10.5)
Chloride: 95 mEq/L — ABNORMAL LOW (ref 96–112)
GFR calc Af Amer: 11 mL/min — ABNORMAL LOW (ref >90)
GFR calc non Af Amer: 10 mL/min — ABNORMAL LOW (ref >90)
GLUCOSE: 82 mg/dL (ref 70–99)
Potassium: 4.8 mEq/L (ref 3.7–5.3)
SODIUM: 135 meq/L — AB (ref 137–147)

## 2014-10-27 LAB — TYPE AND SCREEN
ABO/RH(D): O POS
Antibody Screen: NEGATIVE
Unit division: 0
Unit division: 0
Unit division: 0

## 2014-10-27 LAB — HEPARIN LEVEL (UNFRACTIONATED): HEPARIN UNFRACTIONATED: 0.17 [IU]/mL — AB (ref 0.30–0.70)

## 2014-10-27 LAB — DIFFERENTIAL
BASOS PCT: 1 % (ref 0–1)
Basophils Absolute: 0 10*3/uL (ref 0.0–0.1)
EOS ABS: 0.1 10*3/uL (ref 0.0–0.7)
EOS PCT: 9 % — AB (ref 0–5)
LYMPHS ABS: 0.8 10*3/uL (ref 0.7–4.0)
Lymphocytes Relative: 44 % (ref 12–46)
Monocytes Absolute: 0.1 10*3/uL (ref 0.1–1.0)
Monocytes Relative: 9 % (ref 3–12)
Neutro Abs: 0.6 10*3/uL — ABNORMAL LOW (ref 1.7–7.7)
Neutrophils Relative %: 37 % — ABNORMAL LOW (ref 43–77)
Smear Review: DECREASED

## 2014-10-27 LAB — CBC
HEMATOCRIT: 33.6 % — AB (ref 39.0–52.0)
HEMOGLOBIN: 11.3 g/dL — AB (ref 13.0–17.0)
MCH: 34.7 pg — AB (ref 26.0–34.0)
MCHC: 33.6 g/dL (ref 30.0–36.0)
MCV: 103.1 fL — ABNORMAL HIGH (ref 78.0–100.0)
Platelets: 78 10*3/uL — ABNORMAL LOW (ref 150–400)
RBC: 3.26 MIL/uL — ABNORMAL LOW (ref 4.22–5.81)
RDW: 21.7 % — ABNORMAL HIGH (ref 11.5–15.5)
WBC: 1.6 10*3/uL — ABNORMAL LOW (ref 4.0–10.5)

## 2014-10-27 LAB — POCT ACTIVATED CLOTTING TIME: ACTIVATED CLOTTING TIME: 147 s

## 2014-10-27 SURGERY — LEFT HEART CATHETERIZATION WITH CORONARY/GRAFT ANGIOGRAM
Anesthesia: LOCAL

## 2014-10-27 MED ORDER — HEPARIN (PORCINE) IN NACL 2-0.9 UNIT/ML-% IJ SOLN
INTRAMUSCULAR | Status: AC
  Filled 2014-10-27: qty 1500

## 2014-10-27 MED ORDER — SODIUM CHLORIDE 0.9 % IJ SOLN
3.0000 mL | Freq: Two times a day (BID) | INTRAMUSCULAR | Status: DC
  Administered 2014-10-27 – 2014-11-04 (×10): 3 mL via INTRAVENOUS

## 2014-10-27 MED ORDER — LIDOCAINE HCL (PF) 1 % IJ SOLN
INTRAMUSCULAR | Status: AC
  Filled 2014-10-27: qty 30

## 2014-10-27 MED ORDER — MIDAZOLAM HCL 2 MG/2ML IJ SOLN
INTRAMUSCULAR | Status: AC
  Filled 2014-10-27: qty 2

## 2014-10-27 MED ORDER — SODIUM CHLORIDE 0.9 % IJ SOLN: 3.0000 mL | INTRAMUSCULAR | Status: DC | PRN

## 2014-10-27 MED ORDER — LABETALOL HCL 5 MG/ML IV SOLN
INTRAVENOUS | Status: AC
  Filled 2014-10-27: qty 4

## 2014-10-27 MED ORDER — FENTANYL CITRATE 0.05 MG/ML IJ SOLN
INTRAMUSCULAR | Status: AC
  Filled 2014-10-27: qty 2

## 2014-10-27 MED ORDER — HEPARIN SODIUM (PORCINE) 5000 UNIT/ML IJ SOLN
5000.0000 [IU] | Freq: Three times a day (TID) | INTRAMUSCULAR | Status: DC
  Administered 2014-10-27 – 2014-11-04 (×22): 5000 [IU] via SUBCUTANEOUS
  Filled 2014-10-27 (×25): qty 1

## 2014-10-27 MED ORDER — NITROGLYCERIN 1 MG/10 ML FOR IR/CATH LAB
INTRA_ARTERIAL | Status: AC
  Filled 2014-10-27: qty 10

## 2014-10-27 MED ORDER — SODIUM CHLORIDE 0.9 % IV SOLN: 250.0000 mL | INTRAVENOUS | Status: DC | PRN

## 2014-10-27 NOTE — Progress Notes (Signed)
OT Cancellation Note  Patient Details Name: Andrew [name] MRN: [id number] DOB: [birth year]   Cancelled Treatment:    Reason Eval/Treat Not Completed: Patient at procedure or test/ unavailable - pt going to cath lab. Will reattempt.   Darlina Rumpf [place], OTR/L [phone]  10/27/2014, 11:43 AM

## 2014-10-27 NOTE — Progress Notes (Signed)
UR Completed.  Tyashia Morrisette Jane [phone] 12/17/2013  

## 2014-10-27 NOTE — Progress Notes (Signed)
Subjective:  Patient reports doing well overnight. She denies any current chest pain, but says that he does have bilateral chest pain on exertion. Patient has not yet received his cardiac catheterization. Patient states that he did well at dialysis yesterday. Patient reports minimal cough with minimal sputum production. Patient denies any fevers, chills or drainage from his left thigh dialysis graft site.  Objective: Vital signs in last 24 hours: Filed Vitals:   10/26/14 1515 10/26/14 1530 10/26/14 2138 10/27/14 0506  BP: 123/61 127/55 179/65 140/70  Pulse: 86 86 91 88  Temp: 98.2 F (36.8 C) 98.2 F (36.8 C) 99.8 F (37.7 C) 97.9 F (36.6 C)  TempSrc: Oral Oral Oral Oral  Resp: 25 23 18 18   Height:      Weight:    170 lb 11.2 oz (77.429 kg)  SpO2:   99% 95%   Weight change: 4 lb 13.6 oz (2.2 kg)  Intake/Output Summary (Last 24 hours) at 10/27/14 0809 Last data filed at 10/26/14 1530  Gross per 24 hour  Intake    670 ml  Output   1600 ml  Net   -930 ml   Physical Exam  General: resting in bed, comfortable, NAD HEENT: Moist mucous membranes, hard of hearing Cardiac: RRR, no murmurs, rubs, or gallops Pulm: Clear to auscultation bilaterally, no wheezes rales or rhonchi Abd: soft, nondistended, BS present Ext: Left thigh graft site with dressing clean dry and intact, graft with warmth and palpable thrill, no edema Neuro: Alert and oriented  Lab Results: Basic Metabolic Panel:  Recent Labs Lab 10/26/14 0355 10/26/14 1205 10/27/14 0339  NA 137 136* 135*  K 5.7* 5.3 4.8  CL 95* 95* 95*  CO2 25 24 25   GLUCOSE 79 114* 82  BUN 64* 67* 28*  CREATININE 8.70* 9.15* 4.94*  CALCIUM 7.5* 7.5* 7.7*  PHOS 3.5 3.6  --    Liver Function Tests:  Recent Labs Lab 10/26/14 0355 10/26/14 1205  ALBUMIN 2.8* 2.8*   CBC:  Recent Labs Lab 10/20/14 1052  10/26/14 1205 10/27/14 0243  WBC 1.7*  < > 1.7* 1.6*  NEUTROABS 0.4*  --   --   --   HGB 8.2*  < > 7.7* 11.3*  HCT  25.8*  < > 24.1* 33.6*  MCV 112.9*  < > 108.1* 103.1*  PLT 82*  < > 76* 78*  < > = values in this interval not displayed.  Micro Results: Recent Results (from the past 240 hour(s))  Culture, blood (routine x 2)     Status: None (Preliminary result)   Collection Time: 10/23/14  1:20 PM  Result Value Ref Range Status   Specimen Description BLOOD DRAWN BY DIALYSIS  Final   Special Requests BOTTLES DRAWN AEROBIC AND ANAEROBIC 10CC  Final   Culture  Setup Time   Final    10/23/2014 22:43 Performed at [facility]    Culture   Final           BLOOD CULTURE RECEIVED NO GROWTH TO DATE CULTURE WILL BE HELD FOR 5 DAYS BEFORE ISSUING A FINAL NEGATIVE REPORT Performed at [facility]    Report Status PENDING  Incomplete  Wound culture     Status: None   Collection Time: 10/23/14  3:10 PM  Result Value Ref Range Status   Specimen Description WOUND LEFT THIGH  Final   Special Requests PT RECEIVED VANC ON 11/23  Final   Gram Stain   Final  FEW WBC PRESENT,BOTH PMN AND MONONUCLEAR NO SQUAMOUS EPITHELIAL CELLS SEEN NO ORGANISMS SEEN Performed at [facility]    Culture   Final    FEW METHICILLIN RESISTANT STAPHYLOCOCCUS AUREUS Note: RIFAMPIN AND GENTAMICIN SHOULD NOT BE USED AS SINGLE DRUGS FOR TREATMENT OF STAPH INFECTIONS. This organism DOES NOT demonstrate inducible Clindamycin resistance in vitro. CRITICAL RESULT CALLED TO, READ BACK BY AND VERIFIED WITH: BRANDY D 11/30  @945  BY REAMM Performed at [facility]    Report Status 10/26/2014 FINAL  Final   Organism ID, Bacteria METHICILLIN RESISTANT STAPHYLOCOCCUS AUREUS  Final      Susceptibility   Methicillin resistant staphylococcus aureus - MIC*    CLINDAMYCIN <=0.25 SENSITIVE Sensitive     ERYTHROMYCIN >=8 RESISTANT Resistant     GENTAMICIN <=0.5 SENSITIVE Sensitive     LEVOFLOXACIN >=8 RESISTANT Resistant     OXACILLIN >=4 RESISTANT Resistant     PENICILLIN >=0.5 RESISTANT Resistant     RIFAMPIN  <=0.5 SENSITIVE Sensitive     TRIMETH/SULFA <=10 SENSITIVE Sensitive     VANCOMYCIN 1 SENSITIVE Sensitive     TETRACYCLINE <=1 SENSITIVE Sensitive     * FEW METHICILLIN RESISTANT STAPHYLOCOCCUS AUREUS  Culture, blood (routine x 2)     Status: None (Preliminary result)   Collection Time: 10/23/14  3:10 PM  Result Value Ref Range Status   Specimen Description BLOOD  Final   Special Requests BOTTLES DRAWN AEROBIC AND ANAEROBIC 10CC AVG  Final   Culture  Setup Time   Final    10/23/2014 22:42 Performed at [facility]    Culture   Final           BLOOD CULTURE RECEIVED NO GROWTH TO DATE CULTURE WILL BE HELD FOR 5 DAYS BEFORE ISSUING A FINAL NEGATIVE REPORT Performed at [facility]    Report Status PENDING  Incomplete   Studies/Results: No results found.  Medications: I have reviewed the patient's current medications. Scheduled Meds: . allopurinol  100 mg Oral BID  . aspirin EC  81 mg Oral QHS  . atorvastatin  10 mg Oral q1800  . cefTAZidime (FORTAZ)  IV  2 g Intravenous Q M,W,F-HD  . cinacalcet  30 mg Oral Q breakfast  . darbepoetin (ARANESP) injection - DIALYSIS  200 mcg Intravenous Q Mon-HD  . doxercalciferol  3 mcg Intravenous Q M,W,F-HD  . feeding supplement (NEPRO CARB STEADY)  237 mL Oral BID BM  . gabapentin  100 mg Oral Daily  . gabapentin  200 mg Oral QHS  . heparin  4,000 Units Intravenous Once  . midodrine  10 mg Oral TID WC  . polyethylene glycol  17 g Oral QHS  . sevelamer carbonate  2,400 mg Oral TID WC  . sodium chloride  3 mL Intravenous Q12H  . vancomycin  1,250 mg Intravenous Q M,W,F-HD   Continuous Infusions: . sodium chloride    . heparin 1,800 Units/hr (10/27/14 0421)   PRN Meds:.sodium chloride, sodium chloride, albuterol, docusate sodium, feeding supplement (NEPRO CARB STEADY), heparin, heparin, lidocaine (PF), lidocaine-prilocaine, pentafluoroprop-tetrafluoroeth, sevelamer carbonate Assessment/Plan:  Mr. 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  Unstable angina: Scheduled for cath today, initially delayed because of hyperkalemia and anemia. Hemoglobin now up to 11.3, up from 7.7.  Potassium now down to 4.8 from 5.3. -Continue ASA 81mg  & Lipitor 10mg  -Continue heparin per Cardiology. -Probable cardiac catheterization today. -Cardiology following, appreciate recs  MRSA graft site infection: Patient continues to be afebrile. Wound cultures remarkable for MRSA this AM. -Continue vancomycin -Ceftazidime discontinued because of low suspicion for HCAP and 4 day course of treatment.  -Continue wet-to-dry dressing BID per Vascular -Continue assessing for signs of infection  Neutropenia: ANC of 600.   - Neutropenic precautions.  ESRD: Resolution of hyperkalemia with dialysis yesterday. -Continue home Sevelamer, Hectorol, mitodrine, Nepro  MDS: Pancytopenia related to this condition, and treatment side effects of Renvelid. Hemoglobin now 11.3 status post transfusion.  Combined systolic and diastolic CHF: EF 22%, grade 2 diastolic dysfunction, akinesis of the basal-midinferolateral and inferior myocardium per echo (11/28). Wt 170, up from 169 on admission. Net -2.2 L since admission.  -Daily weights & strict I&Os as noted above -Cath today.  Gout: Continue allopurinol 100mg .  HLD: Continue statin as noted above.  Peripheral neuropathy: Continue home gabapentin.  Presumed COPD: No PFTs on file though will continue home albuterol.  FEN:  -Diet: NPO   DVT prophylaxis: heparin per Nephrology  Resolved issues:  - Possible HCAP  CODE STATUS: FULL CODE -Confirmed with him at bedside on admission    Dispo: Disposition is deferred at this time, awaiting improvement of current medical problems.    The patient does have a current PCP Redmond School, MD) and does not need an [facility] hospital follow-up appointment after discharge.  The patient does have transportation limitations that hinder transportation to clinic appointments.  .Services Needed at time of discharge: Y = Yes, Blank = No PT:   OT:   RN:   Equipment:   Other:     LOS: 4 days   Luan Moore, MD 10/27/2014, 8:09 AM

## 2014-10-27 NOTE — Progress Notes (Signed)
[facility] Progress Note   Subjective: No complaints  Filed Vitals:   10/26/14 1530 10/26/14 2138 10/27/14 0506 10/27/14 0950  BP: 127/55 179/65 140/70 154/62  Pulse: 86 91 88 93  Temp: 98.2 F (36.8 C) 99.8 F (37.7 C) 97.9 F (36.6 C)   TempSrc: Oral Oral Oral   Resp: 23 18 18    Height:      Weight:   77.429 kg (170 lb 11.2 oz)   SpO2:  99% 95% 95%   Exam: Alert, elderly, chronically ill-appearing, no distress No jvd Chest some rales L base, some exp wheezing RRR 2/6 SEM, no RG Abd soft, NTND, +BS No LE edema, L thigh AVG patent Small wound lateral L ant thigh is packed, no erythema or drainage.  Neuro is nf, ox 3  HD: MWF [place] F160   77.5kg   2/3.5 Bath   L thigh AVG   Heparin 2000 Aranesp 200 / wk start 12/2 (was 160), Hectorol 3 ug, no Fe PTH 308, Ca/P wnl       Assessment: 1. CP / hx CABG - for cath today 2. Recurrent L thigh AVG infection, s/p revision 10/1 - had bedside I&D 11/28, on IV vanc/fortaz pending cx results, f/b Dr Bridgett Larsson; this is infection of old nonfunctional AVG material. The revised AVG curves lateral to the wound/infected old AVG material by 1-2 cm, we are avoiding using the lateral limb because of the proximity to the infection 3. ESRD on HD 4. HTN/ volume - chronic hypotension on midodrine, at dry wt, some rales and BP up 5. Anemia of CKD - prob ESA resistance due to chronic infection, Hb 7.8, to get 2u prbc with HD today, cont max ESA 6. MBD - cont vit D, sensipar, renvela.  PTH down to 308 in Oct, Ca/P ok.  3.5 Ca bath dec'd to 2.5 by Dr Lorrene Reid here 7. MDS - pancytopenia from Revlimid, on hold. Weekly Granix, Dr Alvy Bimler following 8. Hx CVA 9. Hx recurrent bladder cancer   Plan - for cath today, HD tomorrow.  May dry to lower dry wt some tomorrow.     Kelly Splinter MD  pager [phone]    cell [phone]  10/27/2014, 10:02 AM     Recent Labs Lab 10/25/14 0224 10/26/14 0355 10/26/14 1205 10/27/14 0339  NA 135* 137  136* 135*  K 5.6* 5.7* 5.3 4.8  CL 96 95* 95* 95*  CO2 26 25 24 25   GLUCOSE 91 79 114* 82  BUN 51* 64* 67* 28*  CREATININE 6.72* 8.70* 9.15* 4.94*  CALCIUM 7.6* 7.5* 7.5* 7.7*  PHOS 2.9 3.5 3.6  --     Recent Labs Lab 10/25/14 0224 10/26/14 0355 10/26/14 1205  ALBUMIN 2.9* 2.8* 2.8*    Recent Labs Lab 10/20/14 1052  10/26/14 0355 10/26/14 1205 10/27/14 0243  WBC 1.7*  < > 1.8* 1.7* 1.6*  NEUTROABS 0.4*  --   --   --  0.6*  HGB 8.2*  < > 7.8* 7.7* 11.3*  HCT 25.8*  < > 24.3* 24.1* 33.6*  MCV 112.9*  < > 108.0* 108.1* 103.1*  PLT 82*  < > 80* 76* 78*  < > = values in this interval not displayed. Marland Kitchen allopurinol  100 mg Oral BID  . aspirin EC  81 mg Oral QHS  . atorvastatin  10 mg Oral q1800  . cefTAZidime (FORTAZ)  IV  2 g Intravenous Q M,W,F-HD  . cinacalcet  30 mg Oral Q breakfast  .  darbepoetin (ARANESP) injection - DIALYSIS  200 mcg Intravenous Q Mon-HD  . doxercalciferol  3 mcg Intravenous Q M,W,F-HD  . feeding supplement (NEPRO CARB STEADY)  237 mL Oral BID BM  . gabapentin  100 mg Oral Daily  . gabapentin  200 mg Oral QHS  . heparin  4,000 Units Intravenous Once  . midodrine  10 mg Oral TID WC  . polyethylene glycol  17 g Oral QHS  . sevelamer carbonate  2,400 mg Oral TID WC  . sodium chloride  3 mL Intravenous Q12H  . vancomycin  1,250 mg Intravenous Q M,W,F-HD   . sodium chloride    . heparin 1,800 Units/hr (10/27/14 0421)   sodium chloride, sodium chloride, albuterol, docusate sodium, feeding supplement (NEPRO CARB STEADY), heparin, heparin, lidocaine (PF), lidocaine-prilocaine, pentafluoroprop-tetrafluoroeth, sevelamer carbonate

## 2014-10-27 NOTE — Plan of Care (Signed)
Problem: Phase III Progression Outcomes Goal: Hemodynamically stable Outcome: Completed/Met Date Met:  10/27/14 Goal: No anginal pain Outcome: Completed/Met Date Met:  10/27/14

## 2014-10-27 NOTE — Progress Notes (Signed)
Subjective: No CP or SOB at rest   Objective: Filed Vitals:   10/26/14 1530 10/26/14 2138 10/27/14 0506 10/27/14 0950  BP: 127/55 179/65 140/70 154/62  Pulse: 86 91 88 93  Temp: 98.2 F (36.8 C) 99.8 F (37.7 C) 97.9 F (36.6 C)   TempSrc: Oral Oral Oral   Resp: 23 18 18    Height:      Weight:   170 lb 11.2 oz (77.429 kg)   SpO2:  99% 95% 95%   Weight change: 4 lb 13.6 oz (2.2 kg)  Intake/Output Summary (Last 24 hours) at 10/27/14 0956 Last data filed at 10/27/14 0730  Gross per 24 hour  Intake    670 ml  Output   1600 ml  Net   -930 ml    General: Alert, awake, oriented x3, in no acute distress Neck:  JVP is normal Heart: Regular rate and rhythm, without murmurs, rubs, gallops.  Lungs: Clear to auscultation.  No rales or wheezes. Exemities:  Tr edema.   Neuro: Grossly intact, nonfocal.  Tele  SR   Lab Results: Results for orders placed or performed during the hospital encounter of 10/23/14 (from the past 24 hour(s))  CBC     Status: Abnormal   Collection Time: 10/26/14 12:05 PM  Result Value Ref Range   WBC 1.7 (L) 4.0 - 10.5 K/uL   RBC 2.23 (L) 4.22 - 5.81 MIL/uL   Hemoglobin 7.7 (L) 13.0 - 17.0 g/dL   HCT 24.1 (L) 39.0 - 52.0 %   MCV 108.1 (H) 78.0 - 100.0 fL   MCH 34.5 (H) 26.0 - 34.0 pg   MCHC 32.0 30.0 - 36.0 g/dL   RDW 18.7 (H) 11.5 - 15.5 %   Platelets 76 (L) 150 - 400 K/uL  Renal function panel     Status: Abnormal   Collection Time: 10/26/14 12:05 PM  Result Value Ref Range   Sodium 136 (L) 137 - 147 mEq/L   Potassium 5.3 3.7 - 5.3 mEq/L   Chloride 95 (L) 96 - 112 mEq/L   CO2 24 19 - 32 mEq/L   Glucose, Bld 114 (H) 70 - 99 mg/dL   BUN 67 (H) 6 - 23 mg/dL   Creatinine, Ser 9.15 (H) 0.50 - 1.35 mg/dL   Calcium 7.5 (L) 8.4 - 10.5 mg/dL   Phosphorus 3.6 2.3 - 4.6 mg/dL   Albumin 2.8 (L) 3.5 - 5.2 g/dL   GFR calc non Af Amer 5 (L) >90 mL/min   GFR calc Af Amer 5 (L) >90 mL/min   Anion gap 17 (H) 5 - 15  Vancomycin, random     Status: None   Collection Time: 10/26/14 12:06 PM  Result Value Ref Range   Vancomycin Rm 14.9 ug/mL  Hepatitis B surface antigen     Status: None   Collection Time: 10/26/14 12:15 PM  Result Value Ref Range   Hepatitis B Surface Ag NEGATIVE NEGATIVE  Prepare RBC     Status: None   Collection Time: 10/26/14 12:28 PM  Result Value Ref Range   Order Confirmation ORDER PROCESSED BY BLOOD BANK   CBC     Status: Abnormal   Collection Time: 10/27/14  2:43 AM  Result Value Ref Range   WBC 1.6 (L) 4.0 - 10.5 K/uL   RBC 3.26 (L) 4.22 - 5.81 MIL/uL   Hemoglobin 11.3 (L) 13.0 - 17.0 g/dL   HCT 33.6 (L) 39.0 - 52.0 %   MCV 103.1 (H) 78.0 - 100.0  fL   MCH 34.7 (H) 26.0 - 34.0 pg   MCHC 33.6 30.0 - 36.0 g/dL   RDW 21.7 (H) 11.5 - 15.5 %   Platelets 78 (L) 150 - 400 K/uL  Heparin level (unfractionated)     Status: Abnormal   Collection Time: 10/27/14  2:43 AM  Result Value Ref Range   Heparin Unfractionated 0.17 (L) 0.30 - 0.70 IU/mL  Differential     Status: Abnormal   Collection Time: 10/27/14  2:43 AM  Result Value Ref Range   Neutrophils Relative % 37 (L) 43 - 77 %   Lymphocytes Relative 44 12 - 46 %   Monocytes Relative 9 3 - 12 %   Eosinophils Relative 9 (H) 0 - 5 %   Basophils Relative 1 0 - 1 %   Neutro Abs 0.6 (L) 1.7 - 7.7 K/uL   Lymphs Abs 0.8 0.7 - 4.0 K/uL   Monocytes Absolute 0.1 0.1 - 1.0 K/uL   Eosinophils Absolute 0.1 0.0 - 0.7 K/uL   Basophils Absolute 0.0 0.0 - 0.1 K/uL   WBC Morphology ATYPICAL LYMPHOCYTES    Smear Review PLATELETS APPEAR DECREASED   Basic metabolic panel     Status: Abnormal   Collection Time: 10/27/14  3:39 AM  Result Value Ref Range   Sodium 135 (L) 137 - 147 mEq/L   Potassium 4.8 3.7 - 5.3 mEq/L   Chloride 95 (L) 96 - 112 mEq/L   CO2 25 19 - 32 mEq/L   Glucose, Bld 82 70 - 99 mg/dL   BUN 28 (H) 6 - 23 mg/dL   Creatinine, Ser 4.94 (H) 0.50 - 1.35 mg/dL   Calcium 7.7 (L) 8.4 - 10.5 mg/dL   GFR calc non Af Amer 10 (L) >90 mL/min   GFR calc Af Amer 11 (L)  >90 mL/min   Anion gap 15 5 - 15    Studies/Results: No results found.  Medications: REviewed   @PROBHOSP @  1  CAD  Hx of severe Cad  Plan for cath today    2.  Aortic stenosis  Moderate by echo    3.  Myelodysplastic syndrome    Patinet received Tx yesterday  Hgb improved.  WBC low  Plts 78K  Stable    4. Neutropenia  As above   5  ESRD  Plan for dialysis tomorrow  K ok today.    LOS: 4 days   Dorris Carnes 10/27/2014, 9:56 AM

## 2014-10-27 NOTE — Interval H&P Note (Signed)
History and Physical Interval Note:  10/27/2014 12:26 PM  Andrew [name]  has presented today for surgery, with the diagnosis of Unstable Angina. The various methods of treatment have been discussed with the patient and family. After consideration of risks, benefits and other options for treatment, the patient has consented to  Procedure(s): LEFT HEART CATHETERIZATION WITH CORONARY/GRAFT ANGIOGRAM (N/A) +/- PCI as a surgical intervention .  The patient's history has been reviewed, patient examined, no change in status, stable for surgery.  I have reviewed the patient's chart and labs.  Questions were answered to the patient's satisfaction.    Cath Lab Visit (complete for each Cath Lab visit)  Clinical Evaluation Leading to the Procedure:   ACS: Yes.    Non-ACS:    Anginal Classification: CCS IV  Anti-ischemic medical therapy: Minimal Therapy (1 class of medications)  Non-Invasive Test Results: No non-invasive testing performed  Prior CABG: Previous CABG  AUC - NR  HARDING,DAVID W

## 2014-10-27 NOTE — CV Procedure (Signed)
CARDIAC CATHETERIZATION  REPORT  NAME:  Andrew [name]   MRN: [id number] DOB:  01/30/1926   ADMIT DATE: 10/23/2014 Procedure Date: 10/27/2014  INTERVENTIONAL CARDIOLOGIST: Leonie Man, M.D., MS PRIMARY CARE PROVIDER: [name]., MD PRIMARY CARDIOLOGIST: Dorris Carnes, M.D.  PATIENT:  Andrew [name] is a 80 y.o. male with history of CABG in 1998, widely patent grafts in 2004 by catheterization. He has end-stage renal disease on dialysis. He also has combined systolic and diastolic heart failure with EF of 40-45%. His energy [facility] with signs symptoms concerning of unstable angina in the setting of also having MRSA infection. He is now referred for invasive evaluation with cardiac catheterization.Marland Kitchen  PRE-OPERATIVE DIAGNOSIS:    Unstable angina  Known CAD  PROCEDURES PERFORMED:    Left Heart Catheterization with Native Coronary and Graft Angiography  via Right Common Femoral Artery   Left Ventriculography  PROCEDURE: The patient was brought to the 2nd [facility] in the fasting state and prepped and draped in the usual sterile fashion for right common femoral artery access. Sterile technique was used including antiseptics, cap, gloves, gown, hand hygiene, mask and sheet. Skin prep: Chlorhexidine.   Consent: Risks of procedure as well as the alternatives and risks of each were explained to the (patient/caregiver). Consent for procedure obtained.   Time Out: Verified patient identification, verified procedure, site/side was marked, verified correct patient position, special equipment/implants available, medications/allergies/relevent history reviewed, required imaging and test results available. Performed.  Access:   Right Common Femoral Artery: 5 Fr Sheath -  fluoroscopically guided modified Seldinger Technique; very difficult access due to clotting of the needle with arteriotomy. Versicore wire was used for successful wiring.  Left  Heart Catheterization: 5 Fr Catheters advanced or exchanged over a initially using a Versicore wire then over a long exchange J-wire; JR 4 catheter advanced first.  Left Coronary Artery Cineangiography: JL4  Catheter  Right Coronary Artery, SVG-RCA & SVG-OM Cineangiography: JR 4 Catheter  LIMA-LAD Cineangiography: JR 4 Catheter redirected into Left Subclavian Artery  LV Hemodynamics (LV Gram): AL-1 catheter used to cross the valve with straight wire. Exchanged for Angled Pigtail  Sheath removed in the cardiac catheterization lab with manual pressure for hemostasis.   FINDINGS:  Hemodynamics:   Central Aortic Pressure / Mean: Opening pressure is 157/63/69; final pressures were 102/39/62 after 20 mg IV labetalol mmHg  Left Ventricular Pressure / LVEDP: After labetalol 115/1/7 mmHg - mean gradient roughly 10 mmHg  Left Ventriculography:  EF: 40 - 45 %  Wall Motion: Basal to mid inferolateral hypokinesis/akinesis as well as distal anterior apical hypokinesis.  Coronary Anatomy:  Dominance: Right  Left Main: Moderate large-caliber vessel with 99% distal occlusion prior to the bifurcation of the LAD and Circumflex.  The beginnings of both the LAD and circumflex arise from the same common trunk beyond that stenosis.  LAD: The residual LAD is a small moderate caliber vessel that gives rise to a small proximal diagonal branch and septal perforator before terminating. First diagonal branches small caliber and diffusely diseased. Bifurcates proximally.   Left Circumflex: The only portion of the native circumflex fills is the proximal and AV groove portion. The stumps of the marginal branches are not noted.   RCA: 100% proximal occlusion  Graft Anatomy:  LIMA-LAD: Widely patent graft to the mid LAD. Retrograde filling fills a proximal diagonal branch. Low reveals the LAD with mild diffuse luminal irregularities distally. It wraps the apex briefly. Minimal luminal  irregularities downstream  in the native LAD.  SVG-OM 2-OM 3: Widely patent, large graft that has a proximal anastomosis to a large caliber proximal OM1 branch. There is in a widely patent continuous leg to a smaller distal OM branch.  OM 2 is a large caliber vessel that reaches almost out of the inferoapex. Angiographically normal.  OM 3 is a small caliber diffusely diseased vessel but none greater than 20-30%.  SVG-RPL: Widely patent graft to the proximal right posterior AV groove branch with antegrade flow filling a posterior lateral system with 2 posterior lateral branches. Retrograde flow fills back the native RCA with a RV marginal. There is also been flow down the PDA which reaches down to the apex. Diffuse luminal irregularities throughout the RCA system.  After reviewing the initial angiography, no obvious lesion was noted to explain the patient's unstable angina. This is likely related to hypertension.  MEDICATIONS:  Anesthesia:  Local Lidocaine 14 ml  Sedation:  1 mg IV Versed, 50 mcg IV fentanyl ;   Omnipaque Contrast: 100 ml  IV Labetalol 20 mg 1  PATIENT DISPOSITION:    The patient was transferred to the PACU holding area in a hemodynamicaly stable, chest pain free condition.  The patient tolerated the procedure well, and there were no complications.  EBL:   < 20 ml  The patient was stable before, during, and after the procedure.  POST-OPERATIVE DIAGNOSIS:    Severe native vessel CAD as previously identified. Potential culprits for angina with hypertension would be the small circumflex and diagonal branches perfused through the 90% left main area distal is not a very good PCI target  Widely patent grafts to relatively normal distal target vessels.  Moderately reduced LVEF as previously described.  Significant improvement in blood pressure and LVEDP following blood pressure lowering labetalol.  PLAN OF CARE:  Standard post femoral cath care.  Monitor closely for hypertension rebound post  sheath removal.  Aggressive risk factor modification with blood pressure control.  Treat anemia  Results discussed with Dr. Harrington Challenger, rounding cardiologist  Leonie Man, M.D., M.S. Interventional Cardiologist   Pager # [phone]

## 2014-10-27 NOTE — Clinical Social Work Note (Signed)
Received consult for social work did not have time to meet with patient and complete assessment.  Will meet with patient on Wednesday.  Jones Broom. [place], MSW, [place] 10/27/2014 5:16 PM

## 2014-10-27 NOTE — Evaluation (Signed)
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 Presents with severe retrosternal chest pain and dyspnea with minimal exertion occurring over the last 3 days as well as graft infection of left groin    Clinical Impression  Patient was agreeable to therapy but was very fatigued after all mobility, wheezing even during myotome testing.  He understands that at this point he will need SNF care and is amenable to that.  Pt would benefit from skilled therapy to address weakness, poor endurance, and deficits in functional mobility in order to decrease burden of care and return to PLOF.      Follow Up Recommendations SNF;Supervision/Assistance - 24 hour    Equipment Recommendations  3in1 (PT)    Recommendations for Other Services       Precautions / Restrictions Precautions Precautions: Fall Restrictions Weight Bearing Restrictions: No      Mobility  Bed Mobility Overal bed mobility: Needs Assistance Bed Mobility: Rolling;Sidelying to Sit Rolling: Min assist Sidelying to sit: Min assist       General bed mobility comments: cues and assist to complete rotation of pelvis and elevation of trunk  Transfers Overall transfer level: Needs assistance   Transfers: Sit to/from Stand Sit to Stand: Mod assist         General transfer comment: cues for hand placement, sequence, anterior translation with assist to elevate from surface after 3 trials before successful stand  Ambulation/Gait Ambulation/Gait assistance: Min assist Ambulation Distance (Feet): 20 Feet Assistive device: 4-wheeled walker Gait Pattern/deviations: Step-through pattern;Decreased stride length;Trunk flexed;Shuffle;Narrow base of support   Gait velocity interpretation: Below normal speed for 80/gender General Gait Details: cues for posture, position close to rollator, assist to steer and direct rollator, limited by fatigue, SOB with limited distance although sats remained 95% on RA  Stairs            Wheelchair Mobility    Modified Rankin (Stroke Patients Only)       Balance Overall balance assessment: Needs assistance;History of Falls   Sitting balance-Leahy Scale: Fair       Standing balance-Leahy Scale: Poor                               Pertinent Vitals/Pain Pain Assessment: No/denies pain  sats 95% on RA HR 93    Home Living Family/patient expects to be discharged to:: Private residence Living Arrangements: Alone Available Help at Discharge: Family;Available PRN/intermittently Type of Home: House Home Access: Stairs to enter Entrance Stairs-Rails: Left Entrance Stairs-Number of Steps: 1 Home Layout: One level Home Equipment: Walker - 4 wheels;Walker - 2 wheels;Wheelchair - manual;Cane - single point;Shower seat Additional Comments: friend drives to HD, niece comes by to assist once a day. pt states he wasn't bathing because they wouldn't let him    Prior Function Level of Independence: Independent with assistive device(s)         Comments: with rollator     Hand Dominance        Extremity/Trunk Assessment   Upper Extremity Assessment: Generalized weakness           Lower Extremity Assessment: Generalized weakness;RLE deficits/detail;LLE deficits/detail RLE Deficits / Details: 4+/5 knee extension, 4/5 knee flexion, 4/5 dorsiflexion, 4-/5 hip flexion LLE Deficits / Details: 4+/5 knee extension, 4+/5 knee flexion,  4/5 dorsiflexion, 4-/5 hip flexion  Cervical / Trunk Assessment: Kyphotic  Communication   Communication: HOH  Cognition Arousal/Alertness: Awake/alert Behavior During Therapy: WFL for tasks assessed/performed Overall Cognitive Status: Impaired/Different from baseline Area of Impairment:  Safety/judgement         Safety/Judgement: Decreased awareness of safety;Decreased awareness of deficits          General Comments      Exercises        Assessment/Plan    PT Assessment Patient needs continued PT services  PT Diagnosis Difficulty walking;Abnormality of gait;Generalized weakness   PT Problem List Decreased strength;Decreased activity tolerance;Decreased balance;Decreased mobility;Decreased knowledge of use of DME;Decreased safety awareness;Cardiopulmonary status limiting activity  PT Treatment Interventions Gait training;DME instruction;Functional mobility training;Therapeutic activities;Therapeutic exercise;Patient/family education;Balance training   PT Goals (Current goals can be found in the Care Plan section) Acute Rehab PT Goals Patient Stated Goal: get up and walk PT Goal Formulation: With patient Time For Goal Achievement: 11/10/14 Potential to Achieve Goals: Fair    Frequency Min 3X/week   Barriers to discharge Decreased caregiver support      Co-evaluation               End of Session Equipment Utilized During Treatment: Gait belt Activity Tolerance: Patient limited by fatigue Patient left: in chair Nurse Communication: Mobility status;Precautions         Time: 0786-7544 PT Time Calculation (min) (ACUTE ONLY): 23 min   Charges:   PT Evaluation $Initial PT Evaluation Tier I: 1 Procedure PT Treatments $Therapeutic Activity: 8-22 mins   PT G CodesMelford Aase 10/27/2014, 10:11 AM [name], [place]

## 2014-10-27 NOTE — H&P (View-Only) (Signed)
Subjective: No CP or SOB at rest   Objective: Filed Vitals:   10/26/14 1530 10/26/14 2138 10/27/14 0506 10/27/14 0950  BP: 127/55 179/65 140/70 154/62  Pulse: 86 91 88 93  Temp: 98.2 F (36.8 C) 99.8 F (37.7 C) 97.9 F (36.6 C)   TempSrc: Oral Oral Oral   Resp: 23 18 18    Height:      Weight:   170 lb 11.2 oz (77.429 kg)   SpO2:  99% 95% 95%   Weight change: 4 lb 13.6 oz (2.2 kg)  Intake/Output Summary (Last 24 hours) at 10/27/14 0956 Last data filed at 10/27/14 0730  Gross per 24 hour  Intake    670 ml  Output   1600 ml  Net   -930 ml    General: Alert, awake, oriented x3, in no acute distress Neck:  JVP is normal Heart: Regular rate and rhythm, without murmurs, rubs, gallops.  Lungs: Clear to auscultation.  No rales or wheezes. Exemities:  Tr edema.   Neuro: Grossly intact, nonfocal.  Tele  SR   Lab Results: Results for orders placed or performed during the hospital encounter of 10/23/14 (from the past 24 hour(s))  CBC     Status: Abnormal   Collection Time: 10/26/14 12:05 PM  Result Value Ref Range   WBC 1.7 (L) 4.0 - 10.5 K/uL   RBC 2.23 (L) 4.22 - 5.81 MIL/uL   Hemoglobin 7.7 (L) 13.0 - 17.0 g/dL   HCT 24.1 (L) 39.0 - 52.0 %   MCV 108.1 (H) 78.0 - 100.0 fL   MCH 34.5 (H) 26.0 - 34.0 pg   MCHC 32.0 30.0 - 36.0 g/dL   RDW 18.7 (H) 11.5 - 15.5 %   Platelets 76 (L) 150 - 400 K/uL  Renal function panel     Status: Abnormal   Collection Time: 10/26/14 12:05 PM  Result Value Ref Range   Sodium 136 (L) 137 - 147 mEq/L   Potassium 5.3 3.7 - 5.3 mEq/L   Chloride 95 (L) 96 - 112 mEq/L   CO2 24 19 - 32 mEq/L   Glucose, Bld 114 (H) 70 - 99 mg/dL   BUN 67 (H) 6 - 23 mg/dL   Creatinine, Ser 9.15 (H) 0.50 - 1.35 mg/dL   Calcium 7.5 (L) 8.4 - 10.5 mg/dL   Phosphorus 3.6 2.3 - 4.6 mg/dL   Albumin 2.8 (L) 3.5 - 5.2 g/dL   GFR calc non Af Amer 5 (L) >90 mL/min   GFR calc Af Amer 5 (L) >90 mL/min   Anion gap 17 (H) 5 - 15  Vancomycin, random     Status: None   Collection Time: 10/26/14 12:06 PM  Result Value Ref Range   Vancomycin Rm 14.9 ug/mL  Hepatitis B surface antigen     Status: None   Collection Time: 10/26/14 12:15 PM  Result Value Ref Range   Hepatitis B Surface Ag NEGATIVE NEGATIVE  Prepare RBC     Status: None   Collection Time: 10/26/14 12:28 PM  Result Value Ref Range   Order Confirmation ORDER PROCESSED BY BLOOD BANK   CBC     Status: Abnormal   Collection Time: 10/27/14  2:43 AM  Result Value Ref Range   WBC 1.6 (L) 4.0 - 10.5 K/uL   RBC 3.26 (L) 4.22 - 5.81 MIL/uL   Hemoglobin 11.3 (L) 13.0 - 17.0 g/dL   HCT 33.6 (L) 39.0 - 52.0 %   MCV 103.1 (H) 78.0 - 100.0  fL   MCH 34.7 (H) 26.0 - 34.0 pg   MCHC 33.6 30.0 - 36.0 g/dL   RDW 21.7 (H) 11.5 - 15.5 %   Platelets 78 (L) 150 - 400 K/uL  Heparin level (unfractionated)     Status: Abnormal   Collection Time: 10/27/14  2:43 AM  Result Value Ref Range   Heparin Unfractionated 0.17 (L) 0.30 - 0.70 IU/mL  Differential     Status: Abnormal   Collection Time: 10/27/14  2:43 AM  Result Value Ref Range   Neutrophils Relative % 37 (L) 43 - 77 %   Lymphocytes Relative 44 12 - 46 %   Monocytes Relative 9 3 - 12 %   Eosinophils Relative 9 (H) 0 - 5 %   Basophils Relative 1 0 - 1 %   Neutro Abs 0.6 (L) 1.7 - 7.7 K/uL   Lymphs Abs 0.8 0.7 - 4.0 K/uL   Monocytes Absolute 0.1 0.1 - 1.0 K/uL   Eosinophils Absolute 0.1 0.0 - 0.7 K/uL   Basophils Absolute 0.0 0.0 - 0.1 K/uL   WBC Morphology ATYPICAL LYMPHOCYTES    Smear Review PLATELETS APPEAR DECREASED   Basic metabolic panel     Status: Abnormal   Collection Time: 10/27/14  3:39 AM  Result Value Ref Range   Sodium 135 (L) 137 - 147 mEq/L   Potassium 4.8 3.7 - 5.3 mEq/L   Chloride 95 (L) 96 - 112 mEq/L   CO2 25 19 - 32 mEq/L   Glucose, Bld 82 70 - 99 mg/dL   BUN 28 (H) 6 - 23 mg/dL   Creatinine, Ser 4.94 (H) 0.50 - 1.35 mg/dL   Calcium 7.7 (L) 8.4 - 10.5 mg/dL   GFR calc non Af Amer 10 (L) >90 mL/min   GFR calc Af Amer 11 (L)  >90 mL/min   Anion gap 15 5 - 15    Studies/Results: No results found.  Medications: REviewed   @PROBHOSP @  1  CAD  Hx of severe Cad  Plan for cath today    2.  Aortic stenosis  Moderate by echo    3.  Myelodysplastic syndrome    Patinet received Tx yesterday  Hgb improved.  WBC low  Plts 78K  Stable    4. Neutropenia  As above   5  ESRD  Plan for dialysis tomorrow  K ok today.    LOS: 4 days   Dorris Carnes 10/27/2014, 9:56 AM

## 2014-10-27 NOTE — Progress Notes (Signed)
ANTICOAGULATION CONSULT NOTE - Follow Up Consult  Pharmacy Consult for Heparin  Indication: chest pain/ACS  No Known Allergies  Patient Measurements: Height: 5\' 8"  (172.7 cm) Weight: 177 lb 4 oz (80.4 kg) IBW/kg (Calculated) : 68.4  Vital Signs: Temp: 99.8 F (37.7 C) (11/30 2138) Temp Source: Oral (11/30 2138) BP: 179/65 mmHg (11/30 2138) Pulse Rate: 91 (11/30 2138)  Labs:  Recent Labs  10/24/14 0947  10/25/14 0224  10/25/14 1845 10/26/14 0355 10/26/14 1205 10/27/14 0243  HGB  --   < > 8.0*  --   --  7.8* 7.7* 11.3*  HCT  --   < > 25.2*  --   --  24.3* 24.1* 33.6*  PLT  --   < > 76*  --   --  80* 76* 78*  LABPROT  --   --   --   --   --  15.6*  --   --   INR  --   --   --   --   --  1.22  --   --   HEPARINUNFRC  --   < >  --   < > 0.24* 0.34  --  0.17*  CREATININE  --   --  6.72*  --   --  8.70* 9.15*  --   TROPONINI <0.30  --   --   --   --   --   --   --   < > = values in this interval not displayed.  Estimated Creatinine Clearance: 5.6 mL/min (by C-G formula based on Cr of 9.15).   Assessment: Sub-therapeutic heparin level, other labs as above. Of note, heparin was off for ~ 20 minutes about 3 hours ago (will still increase rate some, but will be more conservative).   Goal of Therapy:  Heparin level 0.3-0.7 units/ml Monitor platelets by anticoagulation protocol: Yes   Plan:  -Increase heparin to 1800 units/hr -1200 HL -Daily CBC/HL -Monitor for bleeding  Narda Bonds 10/27/2014,4:16 AM

## 2014-10-28 DIAGNOSIS — R0602 Shortness of breath: Secondary | ICD-10-CM

## 2014-10-28 DIAGNOSIS — B9562 Methicillin resistant Staphylococcus aureus infection as the cause of diseases classified elsewhere: Secondary | ICD-10-CM

## 2014-10-28 LAB — CBC WITH DIFFERENTIAL/PLATELET
Basophils Absolute: 0 10*3/uL (ref 0.0–0.1)
Basophils Relative: 0 % (ref 0–1)
Eosinophils Absolute: 0.1 10*3/uL (ref 0.0–0.7)
Eosinophils Relative: 4 % (ref 0–5)
HEMATOCRIT: 31.4 % — AB (ref 39.0–52.0)
Hemoglobin: 10.2 g/dL — ABNORMAL LOW (ref 13.0–17.0)
LYMPHS ABS: 0.7 10*3/uL (ref 0.7–4.0)
Lymphocytes Relative: 35 % (ref 12–46)
MCH: 33 pg (ref 26.0–34.0)
MCHC: 32.5 g/dL (ref 30.0–36.0)
MCV: 101.6 fL — AB (ref 78.0–100.0)
Monocytes Absolute: 0.2 10*3/uL (ref 0.1–1.0)
Monocytes Relative: 10 % (ref 3–12)
NEUTROS PCT: 51 % (ref 43–77)
Neutro Abs: 1.1 10*3/uL — ABNORMAL LOW (ref 1.7–7.7)
Platelets: 71 10*3/uL — ABNORMAL LOW (ref 150–400)
RBC: 3.09 MIL/uL — AB (ref 4.22–5.81)
RDW: 20.4 % — ABNORMAL HIGH (ref 11.5–15.5)
WBC: 2.1 10*3/uL — ABNORMAL LOW (ref 4.0–10.5)

## 2014-10-28 LAB — BASIC METABOLIC PANEL
Anion gap: 15 (ref 5–15)
BUN: 47 mg/dL — ABNORMAL HIGH (ref 6–23)
CALCIUM: 7.3 mg/dL — AB (ref 8.4–10.5)
CO2: 25 mEq/L (ref 19–32)
Chloride: 95 mEq/L — ABNORMAL LOW (ref 96–112)
Creatinine, Ser: 7.17 mg/dL — ABNORMAL HIGH (ref 0.50–1.35)
GFR calc Af Amer: 7 mL/min — ABNORMAL LOW (ref >90)
GFR calc non Af Amer: 6 mL/min — ABNORMAL LOW (ref >90)
GLUCOSE: 88 mg/dL (ref 70–99)
Potassium: 5.6 mEq/L — ABNORMAL HIGH (ref 3.7–5.3)
Sodium: 135 mEq/L — ABNORMAL LOW (ref 137–147)

## 2014-10-28 LAB — TROPONIN I: Troponin I: <0.3 ng/mL (ref <0.30)

## 2014-10-28 LAB — PATHOLOGIST SMEAR REVIEW

## 2014-10-28 MED ORDER — ACETAMINOPHEN 325 MG PO TABS
650.0000 mg | ORAL_TABLET | Freq: Once | ORAL | Status: AC
  Administered 2014-10-28: 650 mg via ORAL

## 2014-10-28 MED ORDER — SODIUM CHLORIDE 0.9 % IV SOLN
1250.0000 mg | Freq: Once | INTRAVENOUS | Status: AC
  Administered 2014-10-28: 1250 mg via INTRAVENOUS
  Filled 2014-10-28: qty 1250

## 2014-10-28 MED ORDER — DOXYCYCLINE HYCLATE 100 MG PO TABS
100.0000 mg | ORAL_TABLET | Freq: Two times a day (BID) | ORAL | Status: DC
  Administered 2014-10-28: 100 mg via ORAL
  Filled 2014-10-28 (×3): qty 1

## 2014-10-28 MED ORDER — DOXERCALCIFEROL 4 MCG/2ML IV SOLN
INTRAVENOUS | Status: AC
  Filled 2014-10-28: qty 2

## 2014-10-28 MED ORDER — VANCOMYCIN HCL 1000 MG IV SOLR
1250.0000 mg | INTRAVENOUS | Status: DC
  Administered 2014-10-30 – 2014-11-02 (×2): 1250 mg via INTRAVENOUS
  Filled 2014-10-28 (×4): qty 1250

## 2014-10-28 MED ORDER — MIDODRINE HCL 5 MG PO TABS
ORAL_TABLET | ORAL | Status: AC
  Administered 2014-10-28: 16:00:00
  Filled 2014-10-28: qty 2

## 2014-10-28 NOTE — Clinical Social Work Note (Addendum)
Contacted patient's daughter to discuss SNF bed offers, left a message on patient's daughter voice mail to call back CSW.  Attempted to see patient to complete assessment, patient is in dialysis, will try later today or tomorrow.  Jones Broom. [place], MSW, [place] 10/28/2014 2:36 PM

## 2014-10-28 NOTE — Procedures (Signed)
I was present at this dialysis session, have reviewed the session itself and made  appropriate changes  Kelly Splinter MD (pgr) [phone]    ([phone] 10/28/2014, 2:13 PM

## 2014-10-28 NOTE — Evaluation (Signed)
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 Presents with severe retrosternal chest pain and dyspnea with minimal exertion occurring over the last 3 days as well as graft infection of left groin. Pt had cardiac cath 12/2   Clinical Impression   Pt admitted with chest pain. Pt currently with functional limitations due to the deficits listed below (see OT Problem List).  Pt will benefit from skilled OT to increase their safety and independence with ADL and functional mobility for ADL to facilitate discharge to venue listed below.      Follow Up Recommendations  SNF          Precautions / Restrictions Restrictions Weight Bearing Restrictions: No      Mobility Bed Mobility Overal bed mobility: Needs Assistance Bed Mobility: Rolling Rolling: Max assist         General bed mobility comments: max- total A to reposition in bed  Transfers                 General transfer comment: did not perform    Balance                                            ADL Overall ADL's : Needs assistance/impaired     Grooming: Wash/dry face;Bed level;Total assistance                                 General ADL Comments: Pt very lethargic . Pt did open eyes during grooming task-but with limited participation. Spoke with RN regarding his lethargy. She is aware               Pertinent Vitals/Pain Pain Assessment: No/denies pain     Hand Dominance     Extremity/Trunk Assessment Upper Extremity Assessment Upper Extremity Assessment: Difficult to assess due to impaired cognition (PROM WFL/ Did observe pt lift arms briefly against gravity)           Communication Communication Communication: HOH   Cognition Arousal/Alertness:  Lethargic   Overall Cognitive Status: Impaired/Different from baseline                 General Comments: pt very sleepy   General Comments   limited eval/  Pt very fatigued.  Attempted grooming and repositioning in bed as pt leaning to left            Home Living Family/patient expects to be discharged to:: Private residence Living Arrangements: Alone Available Help at Discharge: Family;Available PRN/intermittently Type of Home: House Home Access: Stairs to enter [employer] of Steps: 1 Entrance Stairs-Rails: Left Home Layout: One level     Bathroom Shower/Tub: Tub/shower unit         Home Equipment: Environmental consultant - 4 wheels;Walker - 2 wheels;Wheelchair - manual;Cane - single point;Shower seat   Additional Comments: friend drives to HD, niece comes by to assist once a day. pt states he wasn't bathing because they wouldn't let him      Prior Functioning/Environment Level of Independence: Independent with assistive device(s)        Comments: with rollator    OT Diagnosis: Generalized weakness;Altered mental status   OT  Problem List: Decreased activity tolerance;Decreased range of motion;Decreased strength   OT Treatment/Interventions: Self-care/ADL training;Patient/family education    OT Goals(Current goals can be found in the care plan section) Acute Rehab OT Goals Patient Stated Goal: did not state  OT Frequency: Min 2X/week   Barriers to D/C: Decreased caregiver support             End of Session Nurse Communication: Mobility status  Activity Tolerance: Patient limited by fatigue Patient left: in bed   Time: 0949-1010 OT Time Calculation (min): 21 min Charges:  OT General Charges $OT Visit: 1 Procedure OT Evaluation $Initial OT Evaluation Tier I: 1 Procedure OT Treatments $Self Care/Home Management : 8-22 mins G-Codes:    Payton Mccallum D November 21, 2014, 10:37 AM

## 2014-10-28 NOTE — Progress Notes (Signed)
[facility] Progress Note   Subjective: No complaints  Filed Vitals:   10/28/14 1200 10/28/14 1230 10/28/14 1300 10/28/14 1330  BP: 135/67 138/59 118/57 117/63  Pulse: 84 90 84 92  Temp: 97.6 F (36.4 C)     TempSrc: Oral     Resp: 20 16 15 25   Height:      Weight: 80.2 kg (176 lb 12.9 oz)     SpO2: 96%      Exam: Alert, elderly, chronically ill-appearing, no distress No jvd Chest rales L base, o/w clear RRR 2/6 SEM, no RG Abd soft, NTND, +BS No LE edema, L thigh AVG patent Small wound lateral L ant thigh is packed, no erythema or drainage.  Neuro is nf, ox 3  HD: MWF [place] F160   77.5kg   2/3.5 Bath   L thigh AVG   Heparin 2000 Aranesp 200 / wk start 12/2 (was 160), Hectorol 3 ug, no Fe PTH 308, Ca/P wnl       Assessment: 1. CP / hx CABG - for cath today 2. Recurrent L thigh AVG infection, s/p revision 10/1 - had bedside I&D 11/28, on IV vanc/fortaz pending cx results, f/b Dr Bridgett Larsson; this is infection of old nonfunctional AVG material. The revised AVG curves lateral to the packed wound (infected old AVG material) by 1-2 cm, and as such we are avoiding using the lateral limb because of the proximity to the infection 3. ESRD on HD 4. HTN/ volume - chronic hypotension on midodrine, +3kg today 5. Anemia of CKD - prob ESA resistance due to chronic infection, s/p 2u prbc, cont max ESA 6. MBD - cont vit D, sensipar, renvela.  PTH down to 308 in Oct, Ca/P ok.  3.5 Ca bath dec'd to 2.5 by Dr Lorrene Reid here 7. MDS - pancytopenia from Revlimid, on hold. Weekly Granix, Dr Alvy Bimler following 8. Hx CVA 9. Hx recurrent bladder cancer 10. Dispo - per primary, stable from renal standpoint   Plan - HD today    Kelly Splinter MD  pager [phone]    cell [phone]  10/28/2014, 2:10 PM     Recent Labs Lab 10/25/14 0224 10/26/14 0355 10/26/14 1205 10/27/14 0339 10/28/14 0514  NA 135* 137 136* 135* 135*  K 5.6* 5.7* 5.3 4.8 5.6*  CL 96 95* 95* 95* 95*  CO2 26 25  24 25 25   GLUCOSE 91 79 114* 82 88  BUN 51* 64* 67* 28* 47*  CREATININE 6.72* 8.70* 9.15* 4.94* 7.17*  CALCIUM 7.6* 7.5* 7.5* 7.7* 7.3*  PHOS 2.9 3.5 3.6  --   --     Recent Labs Lab 10/25/14 0224 10/26/14 0355 10/26/14 1205  ALBUMIN 2.9* 2.8* 2.8*    Recent Labs Lab 10/26/14 1205 10/27/14 0243 10/28/14 0948  WBC 1.7* 1.6* 2.1*  NEUTROABS  --  0.6* 1.1*  HGB 7.7* 11.3* 10.2*  HCT 24.1* 33.6* 31.4*  MCV 108.1* 103.1* 101.6*  PLT 76* 78* 71*   . allopurinol  100 mg Oral BID  . aspirin EC  81 mg Oral QHS  . atorvastatin  10 mg Oral q1800  . cinacalcet  30 mg Oral Q breakfast  . darbepoetin (ARANESP) injection - DIALYSIS  200 mcg Intravenous Q Mon-HD  . doxercalciferol  3 mcg Intravenous Q M,W,F-HD  . doxycycline  100 mg Oral Q12H  . feeding supplement (NEPRO CARB STEADY)  237 mL Oral BID BM  . gabapentin  100 mg Oral Daily  . gabapentin  200 mg  Oral QHS  . heparin  4,000 Units Intravenous Once  . heparin  5,000 Units Subcutaneous 3 times per day  . midodrine  10 mg Oral TID WC  . polyethylene glycol  17 g Oral QHS  . sevelamer carbonate  2,400 mg Oral TID WC  . sodium chloride  3 mL Intravenous Q12H     sodium chloride, sodium chloride, sodium chloride, albuterol, docusate sodium, feeding supplement (NEPRO CARB STEADY), heparin, heparin, lidocaine (PF), lidocaine-prilocaine, pentafluoroprop-tetrafluoroeth, sevelamer carbonate, sodium chloride

## 2014-10-28 NOTE — Progress Notes (Signed)
ANTIBIOTIC CONSULT NOTE - INITIAL  Pharmacy Consult for Vancomycin Indication: wound infection  No Known Allergies  Patient Measurements: Height: 5\' 8"  (172.7 cm) Weight: 170 lb 6.7 oz (77.3 kg) IBW/kg (Calculated) : 68.4  Vital Signs: Temp: 101.3 F (38.5 C) (12/02 2023) Temp Source: Oral (12/02 2023) BP: 119/44 mmHg (12/02 2023) Pulse Rate: 97 (12/02 2023) Intake/Output from previous day:   Intake/Output from this shift:    Labs:  Recent Labs  10/26/14 1205 10/27/14 0243 10/27/14 0339 10/28/14 0514 10/28/14 0948  WBC 1.7* 1.6*  --   --  2.1*  HGB 7.7* 11.3*  --   --  10.2*  PLT 76* 78*  --   --  71*  CREATININE 9.15*  --  4.94* 7.17*  --    Estimated Creatinine Clearance: 7.2 mL/min (by C-G formula based on Cr of 7.17).  Recent Labs  10/26/14 0355 10/26/14 1206  VANCORANDOM 15.3 14.9     Microbiology: Recent Results (from the past 720 hour(s))  Culture, blood (routine x 2)     Status: None (Preliminary result)   Collection Time: 10/23/14  1:20 PM  Result Value Ref Range Status   Specimen Description BLOOD DRAWN BY DIALYSIS  Final   Special Requests BOTTLES DRAWN AEROBIC AND ANAEROBIC 10CC  Final   Culture  Setup Time   Final    10/23/2014 22:43 Performed at [facility]    Culture   Final           BLOOD CULTURE RECEIVED NO GROWTH TO DATE CULTURE WILL BE HELD FOR 5 DAYS BEFORE ISSUING A FINAL NEGATIVE REPORT Performed at [facility]    Report Status PENDING  Incomplete  Wound culture     Status: None   Collection Time: 10/23/14  3:10 PM  Result Value Ref Range Status   Specimen Description WOUND LEFT THIGH  Final   Special Requests PT RECEIVED VANC ON 11/23  Final   Gram Stain   Final    FEW WBC PRESENT,BOTH PMN AND MONONUCLEAR NO SQUAMOUS EPITHELIAL CELLS SEEN NO ORGANISMS SEEN Performed at [facility]    Culture   Final    FEW METHICILLIN RESISTANT STAPHYLOCOCCUS AUREUS Note: RIFAMPIN AND GENTAMICIN SHOULD NOT BE  USED AS SINGLE DRUGS FOR TREATMENT OF STAPH INFECTIONS. This organism DOES NOT demonstrate inducible Clindamycin resistance in vitro. CRITICAL RESULT CALLED TO, READ BACK BY AND VERIFIED WITH: BRANDY D 11/30  @945  BY REAMM Performed at [facility]    Report Status 10/26/2014 FINAL  Final   Organism ID, Bacteria METHICILLIN RESISTANT STAPHYLOCOCCUS AUREUS  Final      Susceptibility   Methicillin resistant staphylococcus aureus - MIC*    CLINDAMYCIN <=0.25 SENSITIVE Sensitive     ERYTHROMYCIN >=8 RESISTANT Resistant     GENTAMICIN <=0.5 SENSITIVE Sensitive     LEVOFLOXACIN >=8 RESISTANT Resistant     OXACILLIN >=4 RESISTANT Resistant     PENICILLIN >=0.5 RESISTANT Resistant     RIFAMPIN <=0.5 SENSITIVE Sensitive     TRIMETH/SULFA <=10 SENSITIVE Sensitive     VANCOMYCIN 1 SENSITIVE Sensitive     TETRACYCLINE <=1 SENSITIVE Sensitive     * FEW METHICILLIN RESISTANT STAPHYLOCOCCUS AUREUS  Culture, blood (routine x 2)     Status: None (Preliminary result)   Collection Time: 10/23/14  3:10 PM  Result Value Ref Range Status   Specimen Description BLOOD  Final   Special Requests BOTTLES DRAWN AEROBIC AND ANAEROBIC 10CC AVG  Final  Culture  Setup Time   Final    10/23/2014 22:42 Performed at [facility]    Culture   Final           BLOOD CULTURE RECEIVED NO GROWTH TO DATE CULTURE WILL BE HELD FOR 5 DAYS BEFORE ISSUING A FINAL NEGATIVE REPORT Performed at [facility]    Report Status PENDING  Incomplete    Medical History: Past Medical History  Diagnosis Date  . ESRD on hemodialysis     Hemodialysis since 2003; Occluded access in both upper extremities and the right lower extremity, current access as of Aug 2014 is L thigh AVG. Gets HD MWF at [facility].     . Duodenal ulcer     remote  . Chronic combined systolic and diastolic CHF (congestive heart failure)     a. 02/2012 Echo: EF 65%, basal infolat AK, mild conc LVH;  b. 06/2013 Echo: EF 40-45%,  mild LVH, Gr2 DD, basal inf HK->AK, Mod AS, Mild MR, PASP 73  . Arteriosclerotic cardiovascular disease (ASCVD) 1998    a. [id number]; b. 06/2003 Cath: LM 70d, LAD 70ost, 80p, LCX 70p/149m, RCA 80p/141m, LIMA->LAD patent, VG->OM2->OM3 patent, VG->dRCA patent, EF 55%;  c. 02/2012 Lexi MV: inf/inflat scarring w/ ? minimal peri-infarct ischemia.  . Cerebrovascular disease   . Neuropathy of foot   . Degenerative joint disease   . Gastroesophageal reflux disease   . Impaired hearing     Bilateral; hearing aids relatively ineffective  . Hypertension   . Cholelithiasis 03/2011    Diagnosed incidentally on abdominal ultrasound in 03/2011  . Hepatic steatosis   . Macrocytosis 10/20/2012  . Thrombocytopenia 10/20/2012  . Hyperlipidemia   . Secondary hyperparathyroidism   . Peripheral vascular disease   . Gout   . Moderate aortic stenosis     a. 06/2013 Echo: Mod AS, mean grad: 25, peak grad: 57.  . Pulmonary hypertension     a. 06/2013 Echo: PASP 95mmHg.  Marland Kitchen Anemia     Prior blood transfusion  . Complication of anesthesia     daughter reports long episodes confusion after amnesia meds  . Renal cell carcinoma 2001    s/p right nephrectomy-2001; subsequent ESRD  . 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 Hypotension   . Pancytopenia   . MRSA infection     Medications:  Prescriptions prior to admission  Medication Sig Dispense Refill Last Dose  . acetaminophen (TYLENOL) 500 MG tablet Take 500 mg by mouth every 6 (six) hours as needed. For pain/headache   Past Week at Unknown time  . albuterol (PROVENTIL HFA;VENTOLIN HFA) 108 (90 BASE) MCG/ACT inhaler Inhale 2 puffs into the lungs every 6 (six) hours as needed for wheezing or shortness of breath. 1 Inhaler 0 10/23/2014 at Unknown time  . allopurinol (ZYLOPRIM) 100 MG tablet Take 100 mg by mouth 2 (two) times daily.    10/22/2014 at Unknown time  . aspirin EC 81 MG tablet Take 81 mg by mouth at bedtime.    10/23/2014 at  Unknown time  . atorvastatin (LIPITOR) 10 MG tablet Take 10 mg by mouth every morning.    Past Month at Unknown time  . B Complex-C-Folic Acid (DIALYVITE TABLET) TABS Take 1 tablet by mouth at bedtime.    10/22/2014 at Unknown time  . calcium carbonate (TUMS - DOSED IN MG ELEMENTAL CALCIUM) 500 MG chewable tablet Chew 1 tablet by mouth daily as needed for heartburn. For heartburn   Past Week  at Unknown time  . cinacalcet (SENSIPAR) 30 MG tablet Take 30 mg by mouth daily with breakfast.    10/22/2014 at Unknown time  . colchicine 0.6 MG tablet Take 0.6 mg by mouth 2 (two) times daily as needed (for gout).   10/22/2014 at Unknown time  . docusate sodium (COLACE) 100 MG capsule Take 100 mg by mouth daily as needed for mild constipation.    Past Month at Unknown time  . econazole nitrate 1 % cream Apply 1 application topically daily as needed (Athletes foot).   Past Week at Unknown time  . Emollient (LUBRIDERM SERIOUSLY SENSITIVE) LOTN Apply 1 application topically daily.   10/22/2014 at Unknown time  . gabapentin (NEURONTIN) 100 MG capsule Take 100-200 mg by mouth 2 (two) times daily. Takes 1 in the morning and 2 at night   10/22/2014 at Unknown time  . lenalidomide (REVLIMID) 5 MG capsule Take 5 mg by mouth See admin instructions. When neutrophils are above 1. Takes once weekly prn   Past Month at Unknown time  . midodrine (PROAMATINE) 10 MG tablet Take 10-20 mg by mouth See admin instructions. On Dialysis days (M,W,F), patient takes 2 tabs in the morning and 1 in the evening and on the other days patient takes 1 tablet in the morning and 2 in teh evening   10/22/2014 at Unknown time  . oxyCODONE-acetaminophen (PERCOCET/ROXICET) 5-325 MG per tablet Take 1 tablet by mouth every 6 (six) hours as needed. 20 tablet 0 10/20/2014  . polyethylene glycol powder (MIRALAX) powder Take 17 g by mouth at bedtime.    Past Week at Unknown time  . Pramoxine-Menthol (GOLD BOND MAXIMUM RELIEF EX) Apply 1 application  topically daily as needed (Itching).   10/22/2014 at Unknown time  . sevelamer (RENVELA) 800 MG tablet Take 1,600-2,400 mg by mouth See admin instructions. Take 3 tabs with each meals and 2 tabs with snacks   10/22/2014 at Unknown time  . Tbo-Filgrastim (GRANIX) 480 MCG/0.8ML SOSY injection Inject 480 mcg into the skin once a week. Tuesdays   Past Week at Unknown time   Scheduled:  . acetaminophen  650 mg Oral Once  . allopurinol  100 mg Oral BID  . aspirin EC  81 mg Oral QHS  . atorvastatin  10 mg Oral q1800  . cinacalcet  30 mg Oral Q breakfast  . darbepoetin (ARANESP) injection - DIALYSIS  200 mcg Intravenous Q Mon-HD  . doxercalciferol      . doxercalciferol  3 mcg Intravenous Q M,W,F-HD  . feeding supplement (NEPRO CARB STEADY)  237 mL Oral BID BM  . gabapentin  100 mg Oral Daily  . gabapentin  200 mg Oral QHS  . heparin  4,000 Units Intravenous Once  . heparin  5,000 Units Subcutaneous 3 times per day  . midodrine  10 mg Oral TID WC  . polyethylene glycol  17 g Oral QHS  . sevelamer carbonate  2,400 mg Oral TID WC  . sodium chloride  3 mL Intravenous Q12H    Assessment: 80yo male with ESRD was on vancomycin for MRSA thigh graft infection. Patient had just been increased to vancomycin 1250 mg IV MWF with HD based on low pre-HD level and then was transitioned to doxycycline on 12/2. Pharmacy is consulted to dose vancomycin for wound infection as patient continues to fever through doxycycline. Pt is febrile to 101.3, WBC 2.1, sCr 7.17 but on HD MWF.  Previously, pt had received vancomycin from 10/30 through 11/4 and  then 11/16 through 12/1.  Pt went to dialysis today for 4 hours.  Goal of Therapy:  Vancomycin pre-HD level 15-25 mcg/mL  Plan:  Redose vancomycin 1250mg  IV x 1 tonight and then MWF qHD Measure antibiotic drug levels at steady state Follow up culture results  Andrey Cota. Diona Foley, PharmD Clinical Pharmacist Pager [phone] 10/28/2014,9:02 PM

## 2014-10-28 NOTE — Progress Notes (Signed)
Subjective:  Patient reports doing well since his catheterization. He denies any current symptoms of chest pain with exertion. Patient continues to report only a minimal cough with minimal sputum production. Patient otherwise denies any fevers, chills, or drainage from his left thigh dialysis graft site.  Objective: Vital signs in last 24 hours: Filed Vitals:   10/28/14 1500 10/28/14 1530 10/28/14 1608 10/28/14 1658  BP: 125/82 103/50 111/51 84/72  Pulse: 112 92 87 101  Temp:   97.7 F (36.5 C) 99.4 F (37.4 C)  TempSrc:   Oral Axillary  Resp: 34 27 21   Height:      Weight:   170 lb 6.7 oz (77.3 kg)   SpO2:    92%   Weight change: -1 lb 5.2 oz (-0.6 kg)  Intake/Output Summary (Last 24 hours) at 10/28/14 1720 Last data filed at 10/28/14 1608  Gross per 24 hour  Intake      0 ml  Output   3005 ml  Net  -3005 ml   Physical Exam  General: resting in bed, comfortable, NAD HEENT: Moist mucous membranes, hard of hearing Cardiac: RRR, no murmurs, rubs, or gallops Pulm: Clear to auscultation bilaterally, no wheezes rales or rhonchi Abd: soft, nondistended, BS present Ext: Left thigh graft site with dressing clean dry and intact, graft with warmth and palpable thrill, no edema Neuro: Alert and oriented  Lab Results: Basic Metabolic Panel:  Recent Labs Lab 10/26/14 0355 10/26/14 1205 10/27/14 0339 10/28/14 0514  NA 137 136* 135* 135*  K 5.7* 5.3 4.8 5.6*  CL 95* 95* 95* 95*  CO2 25 24 25 25   GLUCOSE 79 114* 82 88  BUN 64* 67* 28* 47*  CREATININE 8.70* 9.15* 4.94* 7.17*  CALCIUM 7.5* 7.5* 7.7* 7.3*  PHOS 3.5 3.6  --   --    Liver Function Tests:  Recent Labs Lab 10/26/14 0355 10/26/14 1205  ALBUMIN 2.8* 2.8*   CBC:  Recent Labs Lab 10/27/14 0243 10/28/14 0948  WBC 1.6* 2.1*  NEUTROABS 0.6* 1.1*  HGB 11.3* 10.2*  HCT 33.6* 31.4*  MCV 103.1* 101.6*  PLT 78* 71*    Micro Results: Recent Results (from the past 240 hour(s))  Culture, blood (routine  x 2)     Status: None (Preliminary result)   Collection Time: 10/23/14  1:20 PM  Result Value Ref Range Status   Specimen Description BLOOD DRAWN BY DIALYSIS  Final   Special Requests BOTTLES DRAWN AEROBIC AND ANAEROBIC 10CC  Final   Culture  Setup Time   Final    10/23/2014 22:43 Performed at [facility]    Culture   Final           BLOOD CULTURE RECEIVED NO GROWTH TO DATE CULTURE WILL BE HELD FOR 5 DAYS BEFORE ISSUING A FINAL NEGATIVE REPORT Performed at [facility]    Report Status PENDING  Incomplete  Wound culture     Status: None   Collection Time: 10/23/14  3:10 PM  Result Value Ref Range Status   Specimen Description WOUND LEFT THIGH  Final   Special Requests PT RECEIVED VANC ON 11/23  Final   Gram Stain   Final    FEW WBC PRESENT,BOTH PMN AND MONONUCLEAR NO SQUAMOUS EPITHELIAL CELLS SEEN NO ORGANISMS SEEN Performed at [facility]    Culture   Final    FEW METHICILLIN RESISTANT STAPHYLOCOCCUS AUREUS Note: RIFAMPIN AND GENTAMICIN SHOULD NOT BE USED AS SINGLE DRUGS FOR TREATMENT OF  STAPH INFECTIONS. This organism DOES NOT demonstrate inducible Clindamycin resistance in vitro. CRITICAL RESULT CALLED TO, READ BACK BY AND VERIFIED WITH: BRANDY D 11/30  @945  BY REAMM Performed at [facility]    Report Status 10/26/2014 FINAL  Final   Organism ID, Bacteria METHICILLIN RESISTANT STAPHYLOCOCCUS AUREUS  Final      Susceptibility   Methicillin resistant staphylococcus aureus - MIC*    CLINDAMYCIN <=0.25 SENSITIVE Sensitive     ERYTHROMYCIN >=8 RESISTANT Resistant     GENTAMICIN <=0.5 SENSITIVE Sensitive     LEVOFLOXACIN >=8 RESISTANT Resistant     OXACILLIN >=4 RESISTANT Resistant     PENICILLIN >=0.5 RESISTANT Resistant     RIFAMPIN <=0.5 SENSITIVE Sensitive     TRIMETH/SULFA <=10 SENSITIVE Sensitive     VANCOMYCIN 1 SENSITIVE Sensitive     TETRACYCLINE <=1 SENSITIVE Sensitive     * FEW METHICILLIN RESISTANT STAPHYLOCOCCUS AUREUS    Culture, blood (routine x 2)     Status: None (Preliminary result)   Collection Time: 10/23/14  3:10 PM  Result Value Ref Range Status   Specimen Description BLOOD  Final   Special Requests BOTTLES DRAWN AEROBIC AND ANAEROBIC 10CC AVG  Final   Culture  Setup Time   Final    10/23/2014 22:42 Performed at [facility]    Culture   Final           BLOOD CULTURE RECEIVED NO GROWTH TO DATE CULTURE WILL BE HELD FOR 5 DAYS BEFORE ISSUING A FINAL NEGATIVE REPORT Performed at [facility]    Report Status PENDING  Incomplete   Studies/Results: No results found.  Medications: I have reviewed the patient's current medications. Scheduled Meds: . allopurinol  100 mg Oral BID  . aspirin EC  81 mg Oral QHS  . atorvastatin  10 mg Oral q1800  . cinacalcet  30 mg Oral Q breakfast  . darbepoetin (ARANESP) injection - DIALYSIS  200 mcg Intravenous Q Mon-HD  . doxercalciferol      . doxercalciferol  3 mcg Intravenous Q M,W,F-HD  . doxycycline  100 mg Oral Q12H  . feeding supplement (NEPRO CARB STEADY)  237 mL Oral BID BM  . gabapentin  100 mg Oral Daily  . gabapentin  200 mg Oral QHS  . heparin  4,000 Units Intravenous Once  . heparin  5,000 Units Subcutaneous 3 times per day  . midodrine      . midodrine  10 mg Oral TID WC  . polyethylene glycol  17 g Oral QHS  . sevelamer carbonate  2,400 mg Oral TID WC  . sodium chloride  3 mL Intravenous Q12H   Continuous Infusions:   PRN Meds:.sodium chloride, sodium chloride, sodium chloride, albuterol, docusate sodium, feeding supplement (NEPRO CARB STEADY), heparin, heparin, lidocaine (PF), lidocaine-prilocaine, pentafluoroprop-tetrafluoroeth, sevelamer carbonate, sodium chloride Assessment/Plan:  Andrew [name] is an 80 year old with end-stage renal disease on hemodialysis, myelodysplastic syndrome, combined systolic and diastolic congestive heart failure, out, hyperlipidemia, and peripheral neuropathy hospitalized for unstable angina  status post cardiac catheterization without intervention and 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  Unstable angina: Patient status post cardiac catheterization with suspected small circumflex and diagonal branches as the etiology for symptoms. However, intervention was not able to be performed. Will continue with risk profile modification.   - Continue with aspirin 81 mg and Lipitor 10 mg  - Cardiology following, appreciate recommendations.  MRSA graft site infection: Patient continues to be afebrile. Wound cultures remarkable for MRSA.  -  Transition from vancomycin to doxycycline. -Continue wet-to-dry dressing BID per Vascular -Continue assessing for signs of infection  Neutropenia: ANC up to 1100 from 600.  - Neutropenic precautions.  - Continue to monitor  ESRD: Patient due for dialysis today. -Continue home Sevelamer, Hectorol, mitodrine, Nepro  MDS: Pancytopenia related to this condition, and treatment side effects of Renvelid. Hemoglobin now 11.3 status post transfusion.   - Continue to follow as an outpatient as this could also be an exacerbating factor for the patient's dyspnea.  Combined systolic and diastolic CHF: EF 38%, grade 2 diastolic dysfunction, akinesis of the basal-midinferolateral and inferior myocardium per echo (11/28). Wt 170, up from 169 on admission. Net -5.2 L since admission.  -Daily weights & strict I&Os as noted above  Gout: Continue allopurinol 100mg .  HLD: Continue statin as noted above.  Peripheral neuropathy: Continue home gabapentin.  Presumed COPD: No PFTs on file though will continue home albuterol.  - Consider outpatient PFT  FEN:  -Diet: Heart healthy diet  DVT prophylaxis: heparin per Nephrology  Resolved issues:  - Possible HCAP  CODE STATUS: FULL CODE -Confirmed with him at bedside on admission   Dispo: Disposition is deferred at this time, awaiting improvement of current medical problems.    The patient does have a  current PCP Redmond School, MD) and does not need an [facility] hospital follow-up appointment after discharge.  The patient does have transportation limitations that hinder transportation to clinic appointments.  .Services Needed at time of discharge: Y = Yes, Blank = No PT:   OT:   RN:   Equipment:   Other:     LOS: 5 days   Andrew Moore, MD 10/28/2014, 5:20 PM

## 2014-10-28 NOTE — Plan of Care (Signed)
Problem: Phase I Progression Outcomes Goal: Voiding-avoid urinary catheter unless indicated Outcome: Not Applicable Date Met:  89/79/15

## 2014-10-28 NOTE — Progress Notes (Signed)
   Subjective: Paitent sleeping or at dialysis  Missed evaluation Objective: Filed Vitals:   10/27/14 1547 10/27/14 1617 10/27/14 1952 10/28/14 0517  BP: 122/48 121/49 117/49 159/50  Pulse: 73 75 72 88  Temp:   99.2 F (37.3 C) 99.5 F (37.5 C)  TempSrc:   Oral Oral  Resp: 18  18 18   Height:      Weight:    175 lb 14.8 oz (79.8 kg)  SpO2: 96%  95% 94%   Weight change: -1 lb 5.2 oz (-0.6 kg) No intake or output data in the 24 hours ending 10/28/14 0801  Patient at dialysis  Lab Results: Results for orders placed or performed during the hospital encounter of 10/23/14 (from the past 24 hour(s))  POCT Activated clotting time     Status: None   Collection Time: 10/27/14  1:33 PM  Result Value Ref Range   Activated Clotting Time 147 seconds  Basic metabolic panel     Status: Abnormal   Collection Time: 10/28/14  5:14 AM  Result Value Ref Range   Sodium 135 (L) 137 - 147 mEq/L   Potassium 5.6 (H) 3.7 - 5.3 mEq/L   Chloride 95 (L) 96 - 112 mEq/L   CO2 25 19 - 32 mEq/L   Glucose, Bld 88 70 - 99 mg/dL   BUN 47 (H) 6 - 23 mg/dL   Creatinine, Ser 7.17 (H) 0.50 - 1.35 mg/dL   Calcium 7.3 (L) 8.4 - 10.5 mg/dL   GFR calc non Af Amer 6 (L) >90 mL/min   GFR calc Af Amer 7 (L) >90 mL/min   Anion gap 15 5 - 15    Studies/Results: No results found.  Medications: Reviewed   @PROBHOSP @  1.  CAD  REsults of cath yesterday showed severe native CAD but patent grafts.  Plan for continued medical Rx  COntrol BP  2.  Aortic stensosi.  Mild by cath    WIll see in AM  LOS: 5 days   Dorris Carnes 10/28/2014, 8:01 AM

## 2014-10-28 NOTE — Plan of Care (Signed)
Problem: Phase I Progression Outcomes Goal: Pain controlled with appropriate interventions Outcome: Completed/Met Date Met:  10/28/14     

## 2014-10-29 ENCOUNTER — Inpatient Hospital Stay (HOSPITAL_COMMUNITY): Payer: Medicare Other

## 2014-10-29 LAB — CBC WITH DIFFERENTIAL/PLATELET
Basophils Absolute: 0 10*3/uL (ref 0.0–0.1)
Basophils Relative: 0 % (ref 0–1)
EOS ABS: 0.1 10*3/uL (ref 0.0–0.7)
Eosinophils Relative: 5 % (ref 0–5)
HEMATOCRIT: 32.1 % — AB (ref 39.0–52.0)
Hemoglobin: 10.1 g/dL — ABNORMAL LOW (ref 13.0–17.0)
LYMPHS ABS: 0.7 10*3/uL (ref 0.7–4.0)
LYMPHS PCT: 30 % (ref 12–46)
MCH: 32.6 pg (ref 26.0–34.0)
MCHC: 31.5 g/dL (ref 30.0–36.0)
MCV: 103.5 fL — AB (ref 78.0–100.0)
MONO ABS: 0.4 10*3/uL (ref 0.1–1.0)
Monocytes Relative: 17 % — ABNORMAL HIGH (ref 3–12)
Neutro Abs: 1.1 10*3/uL — ABNORMAL LOW (ref 1.7–7.7)
Neutrophils Relative %: 48 % (ref 43–77)
Platelets: 75 10*3/uL — ABNORMAL LOW (ref 150–400)
RBC: 3.1 MIL/uL — ABNORMAL LOW (ref 4.22–5.81)
RDW: 20.3 % — AB (ref 11.5–15.5)
WBC: 2.4 10*3/uL — AB (ref 4.0–10.5)

## 2014-10-29 LAB — BASIC METABOLIC PANEL
Anion gap: 15 (ref 5–15)
BUN: 29 mg/dL — ABNORMAL HIGH (ref 6–23)
CALCIUM: 7.9 mg/dL — AB (ref 8.4–10.5)
CO2: 27 meq/L (ref 19–32)
Chloride: 96 mEq/L (ref 96–112)
Creatinine, Ser: 4.8 mg/dL — ABNORMAL HIGH (ref 0.50–1.35)
GFR calc Af Amer: 11 mL/min — ABNORMAL LOW (ref >90)
GFR calc non Af Amer: 10 mL/min — ABNORMAL LOW (ref >90)
GLUCOSE: 98 mg/dL (ref 70–99)
Potassium: 4.9 mEq/L (ref 3.7–5.3)
Sodium: 138 mEq/L (ref 137–147)

## 2014-10-29 LAB — GLUCOSE, CAPILLARY: GLUCOSE-CAPILLARY: 98 mg/dL (ref 70–99)

## 2014-10-29 LAB — CULTURE, BLOOD (ROUTINE X 2)
CULTURE: NO GROWTH
Culture: NO GROWTH

## 2014-10-29 MED ORDER — ACETAMINOPHEN 325 MG PO TABS
650.0000 mg | ORAL_TABLET | Freq: Four times a day (QID) | ORAL | Status: DC | PRN
  Administered 2014-10-29 – 2014-10-31 (×3): 650 mg via ORAL
  Filled 2014-10-29 (×3): qty 2

## 2014-10-29 NOTE — Progress Notes (Signed)
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 Presents with severe retrosternal chest pain and dyspnea with minimal exertion occurring over the last 3 days as well as graft infection of left groin. Pt had cardiac cath 12/2    PT Comments    Pt progressing with all mobility with continued need for assist with transfers and gait with report of dyspnea 2/4 but sats maintaining 97-100% on RA throughout. Pt and dgtr educated throughout for sequence of transfer, recommendation for increased activity with nursing assist and encouraged to perform bil LE HEP throughout day as well. Will continue to follow to progress mobility.  Follow Up Recommendations  SNF;Supervision/Assistance - 24 hour     Equipment Recommendations       Recommendations for Other Services       Precautions / Restrictions Precautions Precautions: Fall    Mobility  Bed Mobility               General bed mobility comments: pt standing on arrival and in chair end of session  Transfers Overall transfer level: Needs assistance   Transfers: Sit to/from Stand Sit to Stand: Mod assist         General transfer comment: max cues for hand placement, anterior translation, foot position and pushing with all extremities to stand with 3 trials to maximize stand and control descent with improved transfer function each repeated trial  Ambulation/Gait Ambulation/Gait assistance: Min assist Ambulation Distance (Feet): 55 Feet (10', 24' with seated rest) Assistive device: Rolling walker (2 wheeled);4-wheeled walker Gait Pattern/deviations: Shuffle;Trunk flexed;Narrow base of support;Step-through pattern;Decreased stride length   Gait velocity interpretation: Below normal speed for age/gender General Gait  Details: cues for posture, position close to rollator and RW with assist to maneuver DME correctly. Pt walked 10' with rollator out of bathroom to chair then switched to RW with increased posture and balance with use of RW vs rollator   Stairs            Wheelchair Mobility    Modified Rankin (Stroke Patients Only)       Balance Overall balance assessment: Needs assistance   Sitting balance-Leahy Scale: Fair       Standing balance-Leahy Scale: Poor                      Cognition Arousal/Alertness: Awake/alert Behavior During Therapy: WFL for tasks assessed/performed Overall Cognitive Status: Impaired/Different from baseline Area of Impairment: Safety/judgement         Safety/Judgement: Decreased awareness of safety;Decreased awareness of deficits          Exercises      General Comments        Pertinent Vitals/Pain Pain Assessment: No/denies pain  HR 99    Home Living                      Prior Function            PT Goals (current goals can now be found in the care plan section) Progress towards PT goals: Progressing toward goals    Frequency       PT Plan Current plan remains appropriate    Co-evaluation             End of Session Equipment Utilized During Treatment: Gait belt Activity Tolerance: Patient limited  by fatigue Patient left: in chair;with call bell/phone within [name];with family/visitor present     Time: 1020-1047 PT Time Calculation (min) (ACUTE ONLY): 27 min  Charges:  $Gait Training: 8-22 mins $Therapeutic Activity: 8-22 mins                    G Codes:      Andrew [name] 11/02/2014, 10:58 AM Andrew [name], Andrew [name]

## 2014-10-29 NOTE — Clinical Social Work Note (Signed)
Spoke to patient's daughter Shauna Hugh and discussed bed offers.  Daughter stated she will look at bed offers, do some research, and discuss with her other sisters where they would like him to go to.  Patient's daughter was informed that patient may leave tomorrow or Friday depending on when he is medically ready and discharge orders are given by physician.  Jones Broom. [place], MSW, [place] 10/29/2014 10:23 AM

## 2014-10-29 NOTE — Progress Notes (Signed)
  PROGRESS NOTE MEDICINE TEACHING ATTENDING   Day 6 of stay Patient name: Andrew [name]   Medical record number: [id number] Date of birth: [birth year]   Subjective Met with patient and his daughter at rounds with the IM team. Patient appears better though reports feeling worse. His daughter thinks he looks much better than presentation. Overnight event: Fever 101F, no chills. Patient has no memory of last night.   Brief Exam Blood pressure 121/45, pulse 97, temperature 99 F (37.2 C), temperature source Axillary, resp. rate 19, height _0  (1.727 m), weight 170 lb 6.7 oz (77.3 kg), SpO2 100 %. Wound (left thigh) looks healing, indurated, no active purulence, no erythema, non-tender, mild warmth. Picture taken by Dr Raelene Bott today will be found in his note. Other than that, no change in exam.   Pertinent Labs  CBC trend  Lab 10/27/14 0243 10/28/14 0948 10/29/14 0304  HGB 11.3* 10.2* 10.1*  HCT 33.6* 31.4* 32.1*  WBC 1.6* 2.1* 2.4*  PLT 78* 71* 75*   Chemistry Trend  Lab 10/24/14 0300 10/25/14 0224 10/26/14 0355 10/26/14 1205 10/27/14 0339 10/28/14 0514 10/29/14 0304  NA 136* 135* 137 136* 135* 135* 138  K 5.1 5.6* 5.7* 5.3 4.8 5.6* 4.9  CL 97 96 95* 95* 95* 95* 96  CO2 _1 GLUCOSE 81 91 79 114* 82 88 98  BUN 29* 51* 64* 67* 28* 47* 29*  CREATININE 4.49* 6.72* 8.70* 9.15* 4.94* 7.17* 4.80*  CALCIUM 8.3* 7.6* 7.5* 7.5* 7.7* 7.3* 7.9*  PHOS 2.6 2.9 3.5 3.6  --   --   --     Brief Assessment and Plan  We will resume Vancomycin with HD until he is 24-48 hours without fever  Blood cultures resent.   USG wound area to see if any abscess that might need surgical intervention.    I have discussed the care of this patient with my IM team residents. Please see the resident note for details.  [place], [place] 10/29/2014, 11:26 AM.

## 2014-10-29 NOTE — Progress Notes (Signed)
[name] Progress Note   Subjective: No complaints  Filed Vitals:   10/28/14 2347 10/29/14 0423 10/29/14 0530 10/29/14 0738  BP:   121/45   Pulse:   85   Temp: 100 F (37.8 C) 98.2 F (36.8 C) 99.2 F (37.3 C) 99 F (37.2 C)  TempSrc: Oral Oral Oral Axillary  Resp:   19   Height:      Weight:      SpO2:   95%    Exam: Alert, elderly, chronically ill-appearing, no distress No jvd Chest rales L base, o/w clear RRR 2/6 SEM, no RG Abd soft, NTND, +BS No LE edema, L thigh AVG patent Small wound lateral L ant thigh is packed, no erythema or drainage.  Neuro is nf, ox 3  HD: MWF Andrew [name] F160   77.5kg   2/3.5 Bath   L thigh AVG   Heparin 2000 Aranesp 200 / wk start 12/2 (was 160), Hectorol 3 ug, no Fe PTH 308, Ca/P wnl       Assessment: 1. CP / hx CABG - cath showed patent grafts 2. Recurrent L thigh AVG infection - had bedside I&D 11/28, on IV vanc/fortaz pending cx results; this is infection of old nonfunctional AVG material. The new AVG curves lateral to the old infected AVG by 1-2 cm, and as such we are avoiding using the new limb because of the proximity to the infection 3. ESRD on HD 4. HTN/ volume - chronic hypotension on midodrine, down to dry wt, CXR 12/3 resolved edema 5. Anemia of CKD - prob ESA resistance due to chronic infection, s/p 2u prbc, cont max ESA 6. MBD - cont vit D, sensipar, renvela.  PTH down to 308 in Oct, Ca/P ok.  3.5 Ca bath dec'd to 2.5 by Dr Lorrene Reid here 7. MDS - pancytopenia from Revlimid, on hold. Weekly Granix, Dr Alvy Bimler following 8. Hx CVA 9. Hx recurrent bladder cancer 10. Dispo - per primary   Plan - cont HD MWF schedule, challenge dry wt next HD    Kelly Splinter MD  pager [phone]    cell [phone]  10/29/2014, 10:33 AM     Recent Labs Lab 10/25/14 0224 10/26/14 0355 10/26/14 1205 10/27/14 0339 10/28/14 0514 10/29/14 0304  NA 135* 137 136* 135* 135* 138  K 5.6* 5.7* 5.3 4.8 5.6* 4.9  CL 96 95* 95*  95* 95* 96  CO2 26 25 24 25 25 27   GLUCOSE 91 79 114* 82 88 98  BUN 51* 64* 67* 28* 47* 29*  CREATININE 6.72* 8.70* 9.15* 4.94* 7.17* 4.80*  CALCIUM 7.6* 7.5* 7.5* 7.7* 7.3* 7.9*  PHOS 2.9 3.5 3.6  --   --   --     Recent Labs Lab 10/25/14 0224 10/26/14 0355 10/26/14 1205  ALBUMIN 2.9* 2.8* 2.8*    Recent Labs Lab 10/27/14 0243 10/28/14 0948 10/29/14 0304  WBC 1.6* 2.1* 2.4*  NEUTROABS 0.6* 1.1* 1.1*  HGB 11.3* 10.2* 10.1*  HCT 33.6* 31.4* 32.1*  MCV 103.1* 101.6* 103.5*  PLT 78* 71* 75*   . allopurinol  100 mg Oral BID  . aspirin EC  81 mg Oral QHS  . atorvastatin  10 mg Oral q1800  . cinacalcet  30 mg Oral Q breakfast  . darbepoetin (ARANESP) injection - DIALYSIS  200 mcg Intravenous Q Mon-HD  . doxercalciferol  3 mcg Intravenous Q M,W,F-HD  . feeding supplement (NEPRO CARB STEADY)  237 mL Oral BID BM  . gabapentin  100 mg  Oral Daily  . gabapentin  200 mg Oral QHS  . heparin  4,000 Units Intravenous Once  . heparin  5,000 Units Subcutaneous 3 times per day  . midodrine  10 mg Oral TID WC  . polyethylene glycol  17 g Oral QHS  . sevelamer carbonate  2,400 mg Oral TID WC  . sodium chloride  3 mL Intravenous Q12H  . [START ON 10/30/2014] vancomycin  1,250 mg Intravenous Q M,W,F-HD     sodium chloride, sodium chloride, sodium chloride, albuterol, docusate sodium, feeding supplement (NEPRO CARB STEADY), heparin, heparin, lidocaine (PF), lidocaine-prilocaine, pentafluoroprop-tetrafluoroeth, sevelamer carbonate, sodium chloride

## 2014-10-29 NOTE — Clinical Social Work Placement (Signed)
Clinical Social Work Department CLINICAL SOCIAL WORK PLACEMENT NOTE 10/29/2014  Patient:  Andrew [name], Andrew [name]  Account Number:  [id number] Admit date:  10/23/2014  Clinical Social Worker:  Kenderick Kobler, LCSWA  Date/time:  10/29/2014 05:08 PM  Clinical Social Work is seeking post-discharge placement for this patient at the following level of care:   SKILLED NURSING   (*CSW will update this form in Epic as items are completed)   10/29/2014  Patient/family provided with [place] Department of Clinical Social Work's list of facilities offering this level of care within the geographic area requested by the patient (or if unable, by the patient's family).  10/29/2014  Patient/family informed of their freedom to choose among providers that offer the needed level of care, that participate in Medicare, Medicaid or managed care program needed by the patient, have an available bed and are willing to accept the patient.  10/29/2014  Patient/family informed of [facility]' ownership interest in [facility], as well as of the fact that they are under no obligation to receive care at this facility.  PASARR submitted to EDS on 10/29/2014 PASARR number received on 10/29/2014  FL2 transmitted to all facilities in geographic area requested by pt/family on  10/29/2014 FL2 transmitted to all facilities within larger geographic area on 10/29/2014  Patient informed that his/her managed care company has contracts with or will negotiate with  certain facilities, including the following:     Patient/family informed of bed offers received:  10/29/2014 Patient chooses bed at [facility] Physician recommends and patient chooses bed at    Patient to be transferred to  on   Patient to be transferred to facility by  Patient and family notified of transfer on  Name of family member notified:    The following physician request were entered in Epic: Physician Request  Please sign  FL2.    Additional Comments:   Jones Broom. [place], MSW, [place] 10/29/2014 5:10 PM

## 2014-10-29 NOTE — Clinical Social Work Psychosocial (Signed)
Clinical Social Work Department BRIEF PSYCHOSOCIAL ASSESSMENT 10/29/2014  Patient:  Andrew [name], Andrew [name]     Account Number:  [id number]     Admit date:  10/23/2014  Clinical Social Worker:  Dian Queen  Date/Time:  10/29/2014 05:02 PM  Referred by:  Physician  Date Referred:  10/29/2014 Referred for  SNF Placement   Other Referral:   Interview type:  Patient Other interview type:   family    PSYCHOSOCIAL DATA Living Status:  ALONE Admitted from facility:   Level of care:   Primary support name:  Madelyn Flavors Primary support relationship to patient:  FAMILY Degree of support available:   Patient has good support network with her daughters.    CURRENT CONCERNS Current Concerns  Post-Acute Placement   Other Concerns:    SOCIAL WORK ASSESSMENT / PLAN Patient is a 80 year old male who lives by himself. Patient was alert and oriented x2 did not talk very much, but responded when asked questions.  Patient's daughter was at bedside and discussed with patient going to a SNF for some short term rehab to get his strength up in order to return back home.  Patient and daughter are in agreement to going to a SNF for rehab.  Patient's daughters looked at different facilities and researched them as well and decided they would like to go to [facility] for rehab. Patient will be discharged to SNF once medically ready and discharge orders are given.   Assessment/plan status:   Other assessment/ plan:   Information/referral to community resources:    PATIENT'S/FAMILY'S RESPONSE TO PLAN OF CARE: Patient and daughters in agreement to plan for short term rehab at [facility].    Jones Broom. [place], MSW, [place] 10/29/2014 5:08 PM

## 2014-10-29 NOTE — Clinical Social Work Note (Signed)
Received a phone call from patient's daughter Andrew [name] who said her and her sister looked at [facility] and decided they would like patient to go there for SNF.  Spoke to patient and informed him that [facility] [name] has a bed in a private room for him to go to for short term rehab once he is medically ready and discharge orders have been received.  Patient may be discharged tomorrow, depending on if physician gives orders.  Patient has dialysis Mon Wed and Fri spoke to RN to find out if patient can get dialysis in morning tomorrow so he can be discharged in the afternoon if he is medically ready.  Nurse said she will see if he can get dialysis in morning.  Andrew [name]. [place], MSW, [place] 10/29/2014 4:59 PM

## 2014-10-29 NOTE — Progress Notes (Signed)
Subjective:  Patient spiked a fever last night (101.3) and was transitioned back to vancomycin from doxycycline. Since then, patient reports that he is feeling better and is comfortably eating breakfast this morning. Daughter reports some concern regarding the waxing and waning nature of the patient's symptoms. She states that she is afraid the patient is quite fatigued when he misses meals because of procedures and dialysis. However, she reports that he seems to be doing much better this morning, probably the best he has been during this hospitalization. however, she does report that there has been purulent drainage proximal to his dialysis access site yesterday and overnight.  Objective: Vital signs in last 24 hours: Filed Vitals:   10/28/14 2023 10/28/14 2347 10/29/14 0423 10/29/14 0530  BP: 119/44   121/45  Pulse: 97   85  Temp: 101.3 F (38.5 C) 100 F (37.8 C) 98.2 F (36.8 C) 99.2 F (37.3 C)  TempSrc: Oral Oral Oral Oral  Resp: 20   19  Height:      Weight:      SpO2: 97%   95%   Weight change: 14.1 oz (0.4 kg)  Intake/Output Summary (Last 24 hours) at 10/29/14 0659 Last data filed at 10/28/14 2200  Gross per 24 hour  Intake    123 ml  Output   3005 ml  Net  -2882 ml   Physical Exam  General: resting in bed, comfortable, NAD HEENT: Moist mucous membranes, hard of hearing Cardiac: RRR, no murmurs, rubs, or gallops Pulm: rales in the left lung base mild upper airway wheezes Abd: soft, nondistended, BS present Ext: Left thigh graft site with dressing clean dry and intact, graft with warmth and palpable thrill, no edema (see below) Neuro: Alert and oriented      Lab Results: Basic Metabolic Panel:  Recent Labs Lab 10/26/14 0355 10/26/14 1205  10/28/14 0514 10/29/14 0304  NA 137 136*  < > 135* 138  K 5.7* 5.3  < > 5.6* 4.9  CL 95* 95*  < > 95* 96  CO2 25 24  < > 25 27  GLUCOSE 79 114*  < > 88 98  BUN 64* 67*  < > 47* 29*  CREATININE 8.70* 9.15*  < >  7.17* 4.80*  CALCIUM 7.5* 7.5*  < > 7.3* 7.9*  PHOS 3.5 3.6  --   --   --   < > = values in this interval not displayed. Liver Function Tests:  Recent Labs Lab 10/26/14 0355 10/26/14 1205  ALBUMIN 2.8* 2.8*   CBC:  Recent Labs Lab 10/28/14 0948 10/29/14 0304  WBC 2.1* 2.4*  NEUTROABS 1.1* 1.1*  HGB 10.2* 10.1*  HCT 31.4* 32.1*  MCV 101.6* 103.5*  PLT 71* 75*    Micro Results: Recent Results (from the past 240 hour(s))  Culture, blood (routine x 2)     Status: None (Preliminary result)   Collection Time: 10/23/14  1:20 PM  Result Value Ref Range Status   Specimen Description BLOOD DRAWN BY DIALYSIS  Final   Special Requests BOTTLES DRAWN AEROBIC AND ANAEROBIC 10CC  Final   Culture  Setup Time   Final    10/23/2014 22:43 Performed at [facility]    Culture   Final           BLOOD CULTURE RECEIVED NO GROWTH TO DATE CULTURE WILL BE HELD FOR 5 DAYS BEFORE ISSUING A FINAL NEGATIVE REPORT Performed at [facility]    Report Status PENDING  Incomplete  Wound culture     Status: None   Collection Time: 10/23/14  3:10 PM  Result Value Ref Range Status   Specimen Description WOUND LEFT THIGH  Final   Special Requests PT RECEIVED VANC ON 11/23  Final   Gram Stain   Final    FEW WBC PRESENT,BOTH PMN AND MONONUCLEAR NO SQUAMOUS EPITHELIAL CELLS SEEN NO ORGANISMS SEEN Performed at [facility]    Culture   Final    FEW METHICILLIN RESISTANT STAPHYLOCOCCUS AUREUS Note: RIFAMPIN AND GENTAMICIN SHOULD NOT BE USED AS SINGLE DRUGS FOR TREATMENT OF STAPH INFECTIONS. This organism DOES NOT demonstrate inducible Clindamycin resistance in vitro. CRITICAL RESULT CALLED TO, READ BACK BY AND VERIFIED WITH: BRANDY D 11/30  @945  BY REAMM Performed at [facility]    Report Status 10/26/2014 FINAL  Final   Organism ID, Bacteria METHICILLIN RESISTANT STAPHYLOCOCCUS AUREUS  Final      Susceptibility   Methicillin resistant staphylococcus aureus - MIC*     CLINDAMYCIN <=0.25 SENSITIVE Sensitive     ERYTHROMYCIN >=8 RESISTANT Resistant     GENTAMICIN <=0.5 SENSITIVE Sensitive     LEVOFLOXACIN >=8 RESISTANT Resistant     OXACILLIN >=4 RESISTANT Resistant     PENICILLIN >=0.5 RESISTANT Resistant     RIFAMPIN <=0.5 SENSITIVE Sensitive     TRIMETH/SULFA <=10 SENSITIVE Sensitive     VANCOMYCIN 1 SENSITIVE Sensitive     TETRACYCLINE <=1 SENSITIVE Sensitive     * FEW METHICILLIN RESISTANT STAPHYLOCOCCUS AUREUS  Culture, blood (routine x 2)     Status: None (Preliminary result)   Collection Time: 10/23/14  3:10 PM  Result Value Ref Range Status   Specimen Description BLOOD  Final   Special Requests BOTTLES DRAWN AEROBIC AND ANAEROBIC 10CC AVG  Final   Culture  Setup Time   Final    10/23/2014 22:42 Performed at [facility]    Culture   Final           BLOOD CULTURE RECEIVED NO GROWTH TO DATE CULTURE WILL BE HELD FOR 5 DAYS BEFORE ISSUING A FINAL NEGATIVE REPORT Performed at [facility]    Report Status PENDING  Incomplete   Studies/Results: No results found.  Medications: I have reviewed the patient's current medications. Scheduled Meds: . allopurinol  100 mg Oral BID  . aspirin EC  81 mg Oral QHS  . atorvastatin  10 mg Oral q1800  . cinacalcet  30 mg Oral Q breakfast  . darbepoetin (ARANESP) injection - DIALYSIS  200 mcg Intravenous Q Mon-HD  . doxercalciferol  3 mcg Intravenous Q M,W,F-HD  . feeding supplement (NEPRO CARB STEADY)  237 mL Oral BID BM  . gabapentin  100 mg Oral Daily  . gabapentin  200 mg Oral QHS  . heparin  4,000 Units Intravenous Once  . heparin  5,000 Units Subcutaneous 3 times per day  . midodrine  10 mg Oral TID WC  . polyethylene glycol  17 g Oral QHS  . sevelamer carbonate  2,400 mg Oral TID WC  . sodium chloride  3 mL Intravenous Q12H  . [START ON 10/30/2014] vancomycin  1,250 mg Intravenous Q M,W,F-HD   Continuous Infusions:   PRN Meds:.sodium chloride, sodium chloride, sodium  chloride, albuterol, docusate sodium, feeding supplement (NEPRO CARB STEADY), heparin, heparin, lidocaine (PF), lidocaine-prilocaine, pentafluoroprop-tetrafluoroeth, sevelamer carbonate, sodium chloride Assessment/Plan:  Patient is an 80 year old with end-stage renal disease on hemodialysis, myelodysplastic syndrome, combined systolic and diastolic congestive heart failure,  hyperlipidemia, and peripheral neuropathy hospitalized for unstable angina status post cardiac catheterization without intervention and 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  Fever: Fever of 101.3 last night and restarted on vancomycin. Patient had a suspected healthcare associated pneumonia during this admission but had not been exhibiting clinical symptoms of pneumonia. Repeat chest x-ray showing resolution of opacities, overall clear. Etiology likely to be MRSA graft site infection as stated below. Patient otherwise hemodynamically stable and satting well on room air. Clinically, patient seems to be much improved upon exam.  -blood cultures drawn -transitioned back to vancomycin from doxycycline -ultrasound of graft site to evaluate for abscess  MRSA graft site infection: patient spiked a fever last night of 101.3. Wound cultures remarkable for MRSA. Slightly increased white count compared to baseline. -Repeat chest x-ray -Transition from vancomy -Continue wet-to-dry dressing BID per Vascular -Continue assessing for signs of infection -ultrasound of graft site to evaluate for abscess  Unstable angina: Patient status post cardiac catheterization with suspected small circumflex and diagonal branches as the etiology for symptoms. However, intervention was not able to be performed. Will continue with risk profile modification.   - Continue with aspirin 81 mg and Lipitor 10 mg  - Cardiology following, appreciate recommendations.  Neutropenia: ANC has been stable at 1100 over the last 2 days.  - Neutropenic  precautions.  - Continue to monitor  ESRD: patient received dialysis yesterday. -Continue home Sevelamer, Hectorol, mitodrine, Nepro  MDS: Pancytopenia related to this condition, and treatment side effects of Renvelid. Hemoglobin has been stable at 10.2 over the last 2 days.  - Continue to follow as an outpatient as this could also be an exacerbating factor for the patient's dyspnea.  Combined systolic and diastolic CHF: EF 31%, grade 2 diastolic dysfunction, akinesis of the basal-midinferolateral and inferior myocardium per echo (11/28). Wt 170, up from 169 on admission. Net -5.1 L since admission.  -Daily weights & strict I&Os as noted above -dialysis as above  Gout: Continue allopurinol 100mg .  HLD: Continue statin as noted above.  Peripheral neuropathy: Continue home gabapentin.  Presumed COPD: No PFTs on file though will continue home albuterol.  - Consider outpatient PFT  FEN:   -Diet: Heart healthy diet  DVT prophylaxis: heparin subq  Resolved issues:  - Possible HCAP: was on a course of ceftazidime in addition to vancomycin  CODE STATUS: FULL CODE  Dispo: Disposition is deferred at this time, awaiting improvement of current medical problems.    The patient does have a current PCP Redmond School, MD) and does not need an [facility] hospital follow-up appointment after discharge.  The patient does have transportation limitations that hinder transportation to clinic appointments.  .Services Needed at time of discharge: Y = Yes, Blank = No PT:   OT:   RN:   Equipment:   Other:     LOS: 6 days   Luan Moore, MD 10/29/2014, 6:59 AM

## 2014-10-30 ENCOUNTER — Inpatient Hospital Stay (HOSPITAL_COMMUNITY): Payer: Medicare Other

## 2014-10-30 ENCOUNTER — Encounter (HOSPITAL_COMMUNITY): Payer: Self-pay | Admitting: Radiology

## 2014-10-30 LAB — CBC WITH DIFFERENTIAL/PLATELET
BASOS ABS: 0 10*3/uL (ref 0.0–0.1)
BASOS PCT: 0 % (ref 0–1)
EOS ABS: 0.1 10*3/uL (ref 0.0–0.7)
Eosinophils Relative: 3 % (ref 0–5)
HCT: 32.6 % — ABNORMAL LOW (ref 39.0–52.0)
Hemoglobin: 10.5 g/dL — ABNORMAL LOW (ref 13.0–17.0)
Lymphocytes Relative: 36 % (ref 12–46)
Lymphs Abs: 1 10*3/uL (ref 0.7–4.0)
MCH: 33.9 pg (ref 26.0–34.0)
MCHC: 32.2 g/dL (ref 30.0–36.0)
MCV: 105.2 fL — ABNORMAL HIGH (ref 78.0–100.0)
MONOS PCT: 11 % (ref 3–12)
Monocytes Absolute: 0.3 10*3/uL (ref 0.1–1.0)
NEUTROS PCT: 50 % (ref 43–77)
Neutro Abs: 1.3 10*3/uL — ABNORMAL LOW (ref 1.7–7.7)
PLATELETS: 83 10*3/uL — AB (ref 150–400)
RBC: 3.1 MIL/uL — ABNORMAL LOW (ref 4.22–5.81)
RDW: 19.7 % — AB (ref 11.5–15.5)
WBC: 2.7 10*3/uL — ABNORMAL LOW (ref 4.0–10.5)

## 2014-10-30 LAB — BASIC METABOLIC PANEL
ANION GAP: 18 — AB (ref 5–15)
BUN: 48 mg/dL — AB (ref 6–23)
CO2: 24 mEq/L (ref 19–32)
Calcium: 7.8 mg/dL — ABNORMAL LOW (ref 8.4–10.5)
Chloride: 91 mEq/L — ABNORMAL LOW (ref 96–112)
Creatinine, Ser: 6.96 mg/dL — ABNORMAL HIGH (ref 0.50–1.35)
GFR calc Af Amer: 7 mL/min — ABNORMAL LOW (ref >90)
GFR, EST NON AFRICAN AMERICAN: 6 mL/min — AB (ref >90)
Glucose, Bld: 89 mg/dL (ref 70–99)
POTASSIUM: 5.6 meq/L — AB (ref 3.7–5.3)
SODIUM: 133 meq/L — AB (ref 137–147)

## 2014-10-30 MED ORDER — IOHEXOL 300 MG/ML  SOLN
100.0000 mL | Freq: Once | INTRAMUSCULAR | Status: AC | PRN
  Administered 2014-10-30: 100 mL via INTRAVENOUS

## 2014-10-30 MED ORDER — DOXERCALCIFEROL 4 MCG/2ML IV SOLN
INTRAVENOUS | Status: AC
  Filled 2014-10-30: qty 2

## 2014-10-30 MED ORDER — PIPERACILLIN-TAZOBACTAM IN DEX 2-0.25 GM/50ML IV SOLN
2.2500 g | Freq: Three times a day (TID) | INTRAVENOUS | Status: DC
  Administered 2014-10-30 – 2014-11-03 (×11): 2.25 g via INTRAVENOUS
  Filled 2014-10-30 (×14): qty 50

## 2014-10-30 NOTE — Progress Notes (Signed)
[facility] Progress Note   Subjective: No complaints  Filed Vitals:   10/29/14 1053 10/29/14 1943 10/29/14 2052 10/30/14 0415  BP:   131/61 132/60  Pulse: 97  84 90  Temp:  101 F (38.3 C) 98.2 F (36.8 C) 99.3 F (37.4 C)  TempSrc:  Axillary Axillary Oral  Resp:   20 20  Height:      Weight:      SpO2: 100%  97% 95%   Exam: Alert, elderly, chronically ill-appearing, no distress No jvd Chest clear bilat RRR 2/6 SEM, no RG Abd soft, NTND, +BS Trace bilat pretib edema, L thigh AVG patent Small wound lateral L ant thigh packed, no erythema or drainage.  Neuro is nf, ox 3  HD: MWF [facility] F160   77.5kg   2/3.5 Bath   L thigh AVG   Heparin 2000 Aranesp 200 / wk start 12/2 (was 160), Hectorol 3 ug, no Fe PTH 308, Ca/P wnl       Assessment: 1. CP / hx CABG - cath showed patent grafts 2. Recurrent L thigh AVG infection / wound cx +MRSA - had bedside I&D 11/28, on IV vanc.  This is infection of old AVG material. The new AVG curves lateral to the old infected AVG by 1-2 cm, and as such we are avoiding using the new limb because of the proximity to the infection 3. ESRD on HD 4. HTN/ volume - chronic hypotension on midodrine, down to dry wt, CXR 12/3 resolved edema 5. Anemia of CKD - prob ESA resistance due to chronic infection, s/p 2u prbc, cont max ESA 6. MBD - cont vit D, sensipar, renvela.  PTH down to 308 in Oct, Ca/P ok.  3.5 Ca bath dec'd to 2.5 by Dr Lorrene Reid here 7. MDS - pancytopenia from Revlimid, on hold. Weekly Granix, Dr Alvy Bimler following 8. Hx CVA 9. Hx recurrent bladder cancer 10. Dispo - per primary   Plan - HD today, challenge dry wt a bit    Kelly Splinter MD  pager [phone]    cell [phone]  10/30/2014, 9:35 AM     Recent Labs Lab 10/25/14 0224 10/26/14 0355 10/26/14 1205  10/28/14 0514 10/29/14 0304 10/30/14 0320  NA 135* 137 136*  < > 135* 138 133*  K 5.6* 5.7* 5.3  < > 5.6* 4.9 5.6*  CL 96 95* 95*  < > 95* 96 91*  CO2 26  25 24   < > 25 27 24   GLUCOSE 91 79 114*  < > 88 98 89  BUN 51* 64* 67*  < > 47* 29* 48*  CREATININE 6.72* 8.70* 9.15*  < > 7.17* 4.80* 6.96*  CALCIUM 7.6* 7.5* 7.5*  < > 7.3* 7.9* 7.8*  PHOS 2.9 3.5 3.6  --   --   --   --   < > = values in this interval not displayed.  Recent Labs Lab 10/25/14 0224 10/26/14 0355 10/26/14 1205  ALBUMIN 2.9* 2.8* 2.8*    Recent Labs Lab 10/28/14 0948 10/29/14 0304 10/30/14 0320  WBC 2.1* 2.4* 2.7*  NEUTROABS 1.1* 1.1* 1.3*  HGB 10.2* 10.1* 10.5*  HCT 31.4* 32.1* 32.6*  MCV 101.6* 103.5* 105.2*  PLT 71* 75* 83*   . allopurinol  100 mg Oral BID  . aspirin EC  81 mg Oral QHS  . atorvastatin  10 mg Oral q1800  . cinacalcet  30 mg Oral Q breakfast  . darbepoetin (ARANESP) injection - DIALYSIS  200 mcg Intravenous Q  Mon-HD  . doxercalciferol  3 mcg Intravenous Q M,W,F-HD  . feeding supplement (NEPRO CARB STEADY)  237 mL Oral BID BM  . gabapentin  100 mg Oral Daily  . gabapentin  200 mg Oral QHS  . heparin  4,000 Units Intravenous Once  . heparin  5,000 Units Subcutaneous 3 times per day  . midodrine  10 mg Oral TID WC  . polyethylene glycol  17 g Oral QHS  . sevelamer carbonate  2,400 mg Oral TID WC  . sodium chloride  3 mL Intravenous Q12H  . vancomycin  1,250 mg Intravenous Q M,W,F-HD     sodium chloride, sodium chloride, sodium chloride, acetaminophen, albuterol, docusate sodium, feeding supplement (NEPRO CARB STEADY), heparin, heparin, lidocaine (PF), lidocaine-prilocaine, pentafluoroprop-tetrafluoroeth, sevelamer carbonate, sodium chloride

## 2014-10-30 NOTE — Progress Notes (Addendum)
ANTIBIOTIC CONSULT NOTE  Pharmacy Consult for Vancomycin / Zosyn Indication: wound infection  No Known Allergies  Labs:  Recent Labs  10/28/14 0514 10/28/14 0948 10/29/14 0304 10/30/14 0320  WBC  --  2.1* 2.4* 2.7*  HGB  --  10.2* 10.1* 10.5*  PLT  --  71* 75* 83*  CREATININE 7.17*  --  4.80* 6.96*   Estimated Creatinine Clearance: 7.4 mL/min (by C-G formula based on Cr of 6.96). No results for input(s): VANCOTROUGH, VANCOPEAK, VANCORANDOM, GENTTROUGH, GENTPEAK, GENTRANDOM, TOBRATROUGH, TOBRAPEAK, TOBRARND, AMIKACINPEAK, AMIKACINTROU, AMIKACIN in the last 72 hours.   Microbiology: Recent Results (from the past 720 hour(s))  Culture, blood (routine x 2)     Status: None   Collection Time: 10/23/14  1:20 PM  Result Value Ref Range Status   Specimen Description BLOOD DRAWN BY DIALYSIS  Final   Special Requests BOTTLES DRAWN AEROBIC AND ANAEROBIC 10CC  Final   Culture  Setup Time   Final    10/23/2014 22:43 Performed at [facility]    Culture   Final    NO GROWTH 5 DAYS Performed at [facility]    Report Status 10/29/2014 FINAL  Final  Wound culture     Status: None   Collection Time: 10/23/14  3:10 PM  Result Value Ref Range Status   Specimen Description WOUND LEFT THIGH  Final   Special Requests PT RECEIVED VANC ON 11/23  Final   Gram Stain   Final    FEW WBC PRESENT,BOTH PMN AND MONONUCLEAR NO SQUAMOUS EPITHELIAL CELLS SEEN NO ORGANISMS SEEN Performed at [facility]    Culture   Final    FEW METHICILLIN RESISTANT STAPHYLOCOCCUS AUREUS Note: RIFAMPIN AND GENTAMICIN SHOULD NOT BE USED AS SINGLE DRUGS FOR TREATMENT OF STAPH INFECTIONS. This organism DOES NOT demonstrate inducible Clindamycin resistance in vitro. CRITICAL RESULT CALLED TO, READ BACK BY AND VERIFIED WITH: BRANDY D 11/30  @945  BY REAMM Performed at [facility]    Report Status 10/26/2014 FINAL  Final   Organism ID, Bacteria METHICILLIN RESISTANT STAPHYLOCOCCUS  AUREUS  Final      Susceptibility   Methicillin resistant staphylococcus aureus - MIC*    CLINDAMYCIN <=0.25 SENSITIVE Sensitive     ERYTHROMYCIN >=8 RESISTANT Resistant     GENTAMICIN <=0.5 SENSITIVE Sensitive     LEVOFLOXACIN >=8 RESISTANT Resistant     OXACILLIN >=4 RESISTANT Resistant     PENICILLIN >=0.5 RESISTANT Resistant     RIFAMPIN <=0.5 SENSITIVE Sensitive     TRIMETH/SULFA <=10 SENSITIVE Sensitive     VANCOMYCIN 1 SENSITIVE Sensitive     TETRACYCLINE <=1 SENSITIVE Sensitive     * FEW METHICILLIN RESISTANT STAPHYLOCOCCUS AUREUS  Culture, blood (routine x 2)     Status: None   Collection Time: 10/23/14  3:10 PM  Result Value Ref Range Status   Specimen Description BLOOD  Final   Special Requests BOTTLES DRAWN AEROBIC AND ANAEROBIC 10CC AVG  Final   Culture  Setup Time   Final    10/23/2014 22:42 Performed at [facility]    Culture   Final    NO GROWTH 5 DAYS Performed at [facility]    Report Status 10/29/2014 FINAL  Final  Culture, blood (routine x 2)     Status: None (Preliminary result)   Collection Time: 10/28/14 10:40 PM  Result Value Ref Range Status   Specimen Description BLOOD LEFT FOREARM  Final   Special Requests BOTTLES DRAWN AEROBIC AND  ANAEROBIC 10CC  Final   Culture  Setup Time   Final    10/29/2014 04:29 Performed at [facility]    Culture   Final           BLOOD CULTURE RECEIVED NO GROWTH TO DATE CULTURE WILL BE HELD FOR 5 DAYS BEFORE ISSUING A FINAL NEGATIVE REPORT Performed at [facility]    Report Status PENDING  Incomplete  Culture, blood (routine x 2)     Status: None (Preliminary result)   Collection Time: 10/28/14 10:45 PM  Result Value Ref Range Status   Specimen Description BLOOD LEFT WRIST  Final   Special Requests BOTTLES DRAWN AEROBIC AND ANAEROBIC 5CC  Final   Culture  Setup Time   Final    10/29/2014 04:29 Performed at [facility]    Culture   Final           BLOOD CULTURE RECEIVED  NO GROWTH TO DATE CULTURE WILL BE HELD FOR 5 DAYS BEFORE ISSUING A FINAL NEGATIVE REPORT Performed at [facility]    Report Status PENDING  Incomplete    Assessment: 80yo male with ESRD was on vancomycin for MRSA thigh graft infection. Patient had just been increased to vancomycin 1250 mg IV MWF with HD based on low pre-HD level and then was transitioned to doxycycline on 12/2. Pharmacy is consulted to dose vancomycin for wound infection as patient continues to fever through doxycycline.   Previously, pt had received vancomycin from 10/30 through 11/4 and then 11/16 through 12/1. Vancomycin 12/2> Left thigh wound culture positive for MRSA  HD on schedule, currently afebrile, WBC = 2.7  Adding Zosyn back 12/4  Goal of Therapy:  Vancomycin pre-HD level 15-25 mcg/mL  Plan:  Continue Vancomycin 1250 mg IV Q HD Zosyn 2.25 grams iv Q 8 hours Continue to follow  Thank you. Anette Guarneri, PharmD [phone]  10/30/2014,11:36 AM

## 2014-10-30 NOTE — Progress Notes (Signed)
Subjective:  Patient had another fever of 101. Patient experiencing some mild chills and dialysis this morning on interview and exam. Patient has no other complaints.   Objective: Vital signs in last 24 hours: Filed Vitals:   10/29/14 1053 10/29/14 1943 10/29/14 2052 10/30/14 0415  BP:   131/61 132/60  Pulse: 97  84 90  Temp:  101 F (38.3 C) 98.2 F (36.8 C) 99.3 F (37.4 C)  TempSrc:  Axillary Axillary Oral  Resp:   20 20  Height:      Weight:      SpO2: 100%  97% 95%   Weight change:   Intake/Output Summary (Last 24 hours) at 10/30/14 1151 Last data filed at 10/29/14 2240  Gross per 24 hour  Intake      3 ml  Output      0 ml  Net      3 ml   Physical Exam  General: resting in bed, comfortable, NAD HEENT: Moist mucous membranes, hard of hearing Cardiac: RRR, no murmurs, rubs, or gallops Pulm: clear to auscultation bileterally Abd: soft, nondistended, BS present Ext: Left thigh graft wound with serosanguinous drainage, no significant purulence, graft with warmth and palpable thrill, no edema Neuro: Alert and oriented  Lab Results: Basic Metabolic Panel:  Recent Labs Lab 10/26/14 0355 10/26/14 1205  10/29/14 0304 10/30/14 0320  NA 137 136*  < > 138 133*  K 5.7* 5.3  < > 4.9 5.6*  CL 95* 95*  < > 96 91*  CO2 25 24  < > 27 24  GLUCOSE 79 114*  < > 98 89  BUN 64* 67*  < > 29* 48*  CREATININE 8.70* 9.15*  < > 4.80* 6.96*  CALCIUM 7.5* 7.5*  < > 7.9* 7.8*  PHOS 3.5 3.6  --   --   --   < > = values in this interval not displayed. Liver Function Tests:  Recent Labs Lab 10/26/14 0355 10/26/14 1205  ALBUMIN 2.8* 2.8*   CBC:  Recent Labs Lab 10/29/14 0304 10/30/14 0320  WBC 2.4* 2.7*  NEUTROABS 1.1* 1.3*  HGB 10.1* 10.5*  HCT 32.1* 32.6*  MCV 103.5* 105.2*  PLT 75* 83*    Micro Results: Recent Results (from the past 240 hour(s))  Culture, blood (routine x 2)     Status: None   Collection Time: 10/23/14  1:20 PM  Result Value Ref Range  Status   Specimen Description BLOOD DRAWN BY DIALYSIS  Final   Special Requests BOTTLES DRAWN AEROBIC AND ANAEROBIC 10CC  Final   Culture  Setup Time   Final    10/23/2014 22:43 Performed at [facility]    Culture   Final    NO GROWTH 5 DAYS Performed at [facility]    Report Status 10/29/2014 FINAL  Final  Wound culture     Status: None   Collection Time: 10/23/14  3:10 PM  Result Value Ref Range Status   Specimen Description WOUND LEFT THIGH  Final   Special Requests PT RECEIVED VANC ON 11/23  Final   Gram Stain   Final    FEW WBC PRESENT,BOTH PMN AND MONONUCLEAR NO SQUAMOUS EPITHELIAL CELLS SEEN NO ORGANISMS SEEN Performed at [facility]    Culture   Final    FEW METHICILLIN RESISTANT STAPHYLOCOCCUS AUREUS Note: RIFAMPIN AND GENTAMICIN SHOULD NOT BE USED AS SINGLE DRUGS FOR TREATMENT OF STAPH INFECTIONS. This organism DOES NOT demonstrate inducible Clindamycin resistance in vitro.  CRITICAL RESULT CALLED TO, READ BACK BY AND VERIFIED WITH: BRANDY D 11/30  @945  BY REAMM Performed at [facility]    Report Status 10/26/2014 FINAL  Final   Organism ID, Bacteria METHICILLIN RESISTANT STAPHYLOCOCCUS AUREUS  Final      Susceptibility   Methicillin resistant staphylococcus aureus - MIC*    CLINDAMYCIN <=0.25 SENSITIVE Sensitive     ERYTHROMYCIN >=8 RESISTANT Resistant     GENTAMICIN <=0.5 SENSITIVE Sensitive     LEVOFLOXACIN >=8 RESISTANT Resistant     OXACILLIN >=4 RESISTANT Resistant     PENICILLIN >=0.5 RESISTANT Resistant     RIFAMPIN <=0.5 SENSITIVE Sensitive     TRIMETH/SULFA <=10 SENSITIVE Sensitive     VANCOMYCIN 1 SENSITIVE Sensitive     TETRACYCLINE <=1 SENSITIVE Sensitive     * FEW METHICILLIN RESISTANT STAPHYLOCOCCUS AUREUS  Culture, blood (routine x 2)     Status: None   Collection Time: 10/23/14  3:10 PM  Result Value Ref Range Status   Specimen Description BLOOD  Final   Special Requests BOTTLES DRAWN AEROBIC AND ANAEROBIC  10CC AVG  Final   Culture  Setup Time   Final    10/23/2014 22:42 Performed at [facility]    Culture   Final    NO GROWTH 5 DAYS Performed at [facility]    Report Status 10/29/2014 FINAL  Final  Culture, blood (routine x 2)     Status: None (Preliminary result)   Collection Time: 10/28/14 10:40 PM  Result Value Ref Range Status   Specimen Description BLOOD LEFT FOREARM  Final   Special Requests BOTTLES DRAWN AEROBIC AND ANAEROBIC 10CC  Final   Culture  Setup Time   Final    10/29/2014 04:29 Performed at [facility]    Culture   Final           BLOOD CULTURE RECEIVED NO GROWTH TO DATE CULTURE WILL BE HELD FOR 5 DAYS BEFORE ISSUING A FINAL NEGATIVE REPORT Performed at [facility]    Report Status PENDING  Incomplete  Culture, blood (routine x 2)     Status: None (Preliminary result)   Collection Time: 10/28/14 10:45 PM  Result Value Ref Range Status   Specimen Description BLOOD LEFT WRIST  Final   Special Requests BOTTLES DRAWN AEROBIC AND ANAEROBIC 5CC  Final   Culture  Setup Time   Final    10/29/2014 04:29 Performed at [facility]    Culture   Final           BLOOD CULTURE RECEIVED NO GROWTH TO DATE CULTURE WILL BE HELD FOR 5 DAYS BEFORE ISSUING A FINAL NEGATIVE REPORT Performed at [facility]    Report Status PENDING  Incomplete   Studies/Results: Dg Chest 2 View  10/29/2014   CLINICAL DATA:  Fever 10/24/2014  EXAM: CHEST  2 VIEW  COMPARISON:  None.  FINDINGS: Interval improvement in aeration bilaterally. Resolution of previously noted infiltrate or edema on the right. Vascularity has improved and is now normal. Lungs are clear and there is no effusion. Right axillary stent with kinking unchanged.  IMPRESSION: Improved aeration bilaterally which may be due to clearing edema or pneumonia. Lungs are now clear.   Electronically Signed   By: Franchot Gallo M.D.   On: 10/29/2014 09:04   [place] Extrem Low Left Ltd  10/29/2014    CLINICAL DATA:  Patient with history of revised left thigh dialysis graft, now with focal area of  erythema. Evaluate for abscess.  EXAM: ULTRASOUND LEFT LOWER EXTREMITY LIMITED  TECHNIQUE: Ultrasound examination of the lower extremity soft tissues was performed in the area of clinical concern.  COMPARISON:  None  FINDINGS: Multiple grayscale and color flow images were provided of the patient's left upper thigh at the area of concern. Additionally, real-time sonographic evaluation was performed by the dictating radiologist.  Acquired cinegraphic images demonstrate that the focal area of erythema within the patient's upper medial thigh corresponds with focal dermal erosion superficial to the abandoned medial thigh dialysis graft. There is no associated organized fluid collection or abscess. This focal area of erythema is remote from the patient's functioning left thigh dialysis graft.  The lateral aspect of the patient's functioning left upper thigh dialysis graft appears widely patent where imaged.  IMPRESSION: 1. Focal area of erythema involving the left thigh corresponds to a focal area of dermal erosion superficial to the patient's abandoned left thigh dialysis graft. No associated organized fluid collection or abscess. 2. The lateral aspect of the patient's functioning left thigh graft appears widely patent were imaged.   Electronically Signed   By: Sandi Mariscal M.D.   On: 10/29/2014 16:25    Medications: I have reviewed the patient's current medications. Scheduled Meds: . allopurinol  100 mg Oral BID  . aspirin EC  81 mg Oral QHS  . atorvastatin  10 mg Oral q1800  . cinacalcet  30 mg Oral Q breakfast  . darbepoetin (ARANESP) injection - DIALYSIS  200 mcg Intravenous Q Mon-HD  . doxercalciferol  3 mcg Intravenous Q M,W,F-HD  . feeding supplement (NEPRO CARB STEADY)  237 mL Oral BID BM  . gabapentin  100 mg Oral Daily  . gabapentin  200 mg Oral QHS  . heparin  4,000 Units Intravenous Once  .  heparin  5,000 Units Subcutaneous 3 times per day  . midodrine  10 mg Oral TID WC  . polyethylene glycol  17 g Oral QHS  . sevelamer carbonate  2,400 mg Oral TID WC  . sodium chloride  3 mL Intravenous Q12H  . vancomycin  1,250 mg Intravenous Q M,W,F-HD   Continuous Infusions:   PRN Meds:.sodium chloride, sodium chloride, sodium chloride, acetaminophen, albuterol, docusate sodium, feeding supplement (NEPRO CARB STEADY), heparin, heparin, lidocaine (PF), lidocaine-prilocaine, pentafluoroprop-tetrafluoroeth, sevelamer carbonate, sodium chloride Assessment/Plan:  Patient is an 80 year old with end-stage renal disease on hemodialysis, myelodysplastic syndrome, combined systolic and diastolic congestive heart failure, hyperlipidemia, and peripheral neuropathy hospitalized for unstable angina status post cardiac catheterization without intervention and 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  MRSA graft site infection/fever: patient spiked fever on the night of 10/28/14 and was transitioned from doxycycline to vancomycin. Patient then spiked another fever on the evening of 10/29/14 of 101 F. Wound cultures remarkable for MRSA. Slightly increased white count compared to baseline. Ultrasound at the graft site was negative for abscess. Patient otherwise hemodynamically stable and satting well on room air. Patient continues to look well clinically.  -UA -reconsult vascular -Continue vancomycin  -Continue wet-to-dry dressing BID   Unstable angina: Patient status post cardiac catheterization with suspected small circumflex and diagonal branches as the etiology for symptoms. However, intervention was not able to be performed. Will continue with risk profile modification.   - Continue with aspirin 81 mg and Lipitor 10 mg  Neutropenia: ANC has been trending up gradually, 1300 today. Question relation to ongoing infection.   - Neutropenic precautions.  - Continue to monitor  ESRD: Patient  receiving  dialysis today. -Continue home Sevelamer, Hectorol, mitodrine, Nepro  MDS: Pancytopenia related to this condition, and treatment side effects of Renvelid. Hemoglobin has been stable at 10.2 over the last 2 days.  - Continue to follow as an outpatient as this could also be an exacerbating factor for the patient's dyspnea.  Combined systolic and diastolic CHF: EF 25%, grade 2 diastolic dysfunction, akinesis of the basal-midinferolateral and inferior myocardium per echo (11/28). Wt 170, up from 169 on admission. Net -5.1 L since admission.  -Daily weights & strict I&Os as noted above -dialysis as above  Gout: Continue allopurinol 100mg .  HLD: Continue statin as noted above.  Peripheral neuropathy: Continue home gabapentin.  Presumed COPD: No PFTs on file though will continue home albuterol.  - Consider outpatient PFT  FEN:   -Diet: Heart healthy diet  DVT prophylaxis: heparin per Nephrology  Resolved issues:  - Possible HCAP: was on a course of ceftazidime in addition to vancomycin  CODE STATUS: FULL CODE -Confirmed with him at bedside on admission   Dispo: Disposition is deferred at this time, awaiting improvement of current medical problems.      The patient does have a current PCP Redmond School, MD) and does not need an [facility] hospital follow-up appointment after discharge.  The patient does have transportation limitations that hinder transportation to clinic appointments.  .Services Needed at time of discharge: Y = Yes, Blank = No PT:   OT:   RN:   Equipment:   Other:     LOS: 7 days   Luan Moore, MD 10/30/2014, 11:51 AM

## 2014-10-30 NOTE — Progress Notes (Signed)
Patient ID: Andrew [name], male   DOB: 1926/06/22, 80 y.o.   MRN: [id number] Patient was admitted with chest pain and had cardiac cath. No intervention indicated. He had short segment of nonfunctional left thigh graft removed by me on 10/01/2014. Left thigh graft is his last available access site. Dr. Doren Custard had rerouted the arterial half of the loop prior to that time which has never been utilized. The area where the short segment of nonfunctional graft has had some drainage which was positive for MRSA. Up till now patient has been afebrile. Apparently had one temp to 1013-axillary last p.m.  On exam today the area where the segment of graft was removed is not inflamed and no evidence of proximal or distal cellulitis. However was able to compress the proximal segment of nonfunctional graft and had some purulent drainage.  Difficult problem because do not want to expose the nonfunctional graft up to where the revision was performed to insert the new functional loop as this will lead to infection of his last available site.  May need exploration in OR first of week to extend the incision proximally and distally and removed some more of the nonfunctional graft although this could lead to infection of the functional graft.  Dr. Trula Slade will follow progress over the weekend

## 2014-10-30 NOTE — Progress Notes (Signed)
[facility] Progress Note  10/30/2014 1:01 PM  HPI: Andrew [name] is an 80 year old male who is s/p removal of short segment of infected nonfunctional left thigh graft by Dr. Kellie Simmering on 10/01/14. We have been consulted regarding a possible infection of his nonfunctional thigh graft. He had a revision of his functioning left thigh graft (arterial half was replaced) by Dr. Scot Dock on 08/27/14. He is currently dialyzing through the venous half of his graft. He was admitted on 10/23/14 due to substernal chest pain. His cardiac catheterization revealed severe native vessel CAD but with widely patent grafts. He was seen over the weekend by Dr. Bridgett Larsson who performed a bedside I&D for a superficial abscess. It was shallow with no further exploration needed at that time. Wet to dry dressings were ordered.    Filed Vitals:   10/30/14 0415  BP: 132/60  Pulse: 90  Temp: 99.3 F (37.4 C)  Resp: 20    Physical Exam: General: seen in HD, in NAD Incisions:  Unable to express any purulence or drainage over surgical site. Functional thigh graft dialyzing.  Pulmonary: nonlabored  CBC    Component Value Date/Time   WBC 2.7* 10/30/2014 0320   WBC 1.7* 10/20/2014 1052   RBC 3.10* 10/30/2014 0320   RBC 2.28* 10/20/2014 1052   RBC 2.87* 12/24/2012 1710   HGB 10.5* 10/30/2014 0320   HGB 8.2* 10/20/2014 1052   HCT 32.6* 10/30/2014 0320   HCT 25.8* 10/20/2014 1052   PLT 83* 10/30/2014 0320   PLT 82* 10/20/2014 1052   MCV 105.2* 10/30/2014 0320   MCV 112.9* 10/20/2014 1052   MCV 102 03/23/2011 0909   MCH 33.9 10/30/2014 0320   MCH 35.9* 10/20/2014 1052   MCHC 32.2 10/30/2014 0320   MCHC 31.8* 10/20/2014 1052   RDW 19.7* 10/30/2014 0320   RDW 17.4* 10/20/2014 1052   LYMPHSABS 1.0 10/30/2014 0320   LYMPHSABS 0.8* 10/20/2014 1052   MONOABS 0.3 10/30/2014 0320   MONOABS 0.3 10/20/2014 1052   EOSABS 0.1 10/30/2014 0320   EOSABS 0.1 10/20/2014 1052   BASOSABS 0.0 10/30/2014 0320   BASOSABS 0.0 10/20/2014 1052    BMET    Component Value Date/Time   NA 133* 10/30/2014 0320   NA 142 08/26/2013 1119   K 5.6* 10/30/2014 0320   K 4.1 08/26/2013 1119   K 6.0 03/23/2011 0912   CL 91* 10/30/2014 0320   CO2 24 10/30/2014 0320   CO2 33* 08/26/2013 1119   GLUCOSE 89 10/30/2014 0320   GLUCOSE 104 08/26/2013 1119   BUN 48* 10/30/2014 0320   BUN 26.8* 08/26/2013 1119   CREATININE 6.96* 10/30/2014 0320   CREATININE 5.3* 08/26/2013 1119   CALCIUM 7.8* 10/30/2014 0320   CALCIUM 8.1* 08/26/2013 1119   CALCIUM 7.8* 03/13/2012 0527   GFRNONAA 6* 10/30/2014 0320   GFRAA 7* 10/30/2014 0320    INR    Component Value Date/Time   INR 1.22 10/26/2014 0355     Intake/Output Summary (Last 24 hours) at 10/30/14 1301 Last data filed at 10/29/14 2240  Gross per 24 hour  Intake      3 ml  Output      0 ml  Net      3 ml     Assessment:  80 y.o. male s/p removal of short segment of infected nonfunctional left thigh graft by Dr. Kellie Simmering on 10/01/14.   Plan: Concern for possible infection of old thigh graft. No purulence expressed. Unfortunately further  removal of the nonfunctional thigh graft is risky due to close proximity to current thigh graft. May need further exploration, but no abscess seen on ultrasound per primary team. Attending to see patient.    Virgina Jock, PA-C [facility] Office: [phone] Pager: [phone] 10/30/2014 1:01 PM

## 2014-10-30 NOTE — Progress Notes (Signed)
PT Cancellation Note  Patient Details Name: Andrew [name] MRN: [id number] DOB: [birth year]   Cancelled Treatment:    Reason Eval/Treat Not Completed: Patient at procedure or test/unavailable (pt in HD and unavailable)   [name] 10/30/2014, 9:52 AM [name], [place]

## 2014-10-30 NOTE — Clinical Social Work Note (Signed)
Spoke to patient's daughter Shauna Hugh to discuss patient discharging today if he is medically ready and discharge orders are given.  Informed patient's daughter that if patient is not medically ready, the physician may keep him another day and he can be discharged over the weekend.  Gave patient weekend social worker phone numbers to contact if she has any questions.  Informed patient's daughter that CSW will keep her and Helene Kelp updated as things progress.  Jones Broom. [place], MSW, [place] 10/30/2014 9:13 AM

## 2014-10-30 NOTE — Progress Notes (Signed)
Medicare Important Message given? YES  (If response is "NO", the following Medicare IM given date fields will be blank)  Date Medicare IM given: 10/30/14 Medicare IM given by:  Milcah Dulany  

## 2014-10-30 NOTE — Procedures (Signed)
I was present at this dialysis session, have reviewed the session itself and made  appropriate changes  Kelly Splinter MD (pgr) [phone]    ([phone] 10/30/2014, 9:38 AM

## 2014-10-30 NOTE — Progress Notes (Signed)
I missed patient yesterday  At tests. I have no new recommendations   Cath on 12/1 with LVEF 40 to 45%  Severe native vessel dz  ONe small Diag and small LCx with disease not amenable to revascularization.  Patent grafts with good targets  Elevated pressures. Mild AS Will be available as needed Otherwise will make sure patient has f/u  Dorris Carnes

## 2014-10-31 LAB — CBC WITH DIFFERENTIAL/PLATELET
BASOS PCT: 0 % (ref 0–1)
Basophils Absolute: 0 10*3/uL (ref 0.0–0.1)
EOS ABS: 0.1 10*3/uL (ref 0.0–0.7)
Eosinophils Relative: 4 % (ref 0–5)
HCT: 30.4 % — ABNORMAL LOW (ref 39.0–52.0)
Hemoglobin: 9.6 g/dL — ABNORMAL LOW (ref 13.0–17.0)
Lymphocytes Relative: 39 % (ref 12–46)
Lymphs Abs: 0.9 10*3/uL (ref 0.7–4.0)
MCH: 33.1 pg (ref 26.0–34.0)
MCHC: 31.6 g/dL (ref 30.0–36.0)
MCV: 104.8 fL — ABNORMAL HIGH (ref 78.0–100.0)
Monocytes Absolute: 0.3 10*3/uL (ref 0.1–1.0)
Monocytes Relative: 14 % — ABNORMAL HIGH (ref 3–12)
NEUTROS ABS: 1 10*3/uL — AB (ref 1.7–7.7)
Neutrophils Relative %: 43 % (ref 43–77)
Platelets: 83 10*3/uL — ABNORMAL LOW (ref 150–400)
RBC: 2.9 MIL/uL — AB (ref 4.22–5.81)
RDW: 19.7 % — ABNORMAL HIGH (ref 11.5–15.5)
WBC: 2.3 10*3/uL — ABNORMAL LOW (ref 4.0–10.5)

## 2014-10-31 LAB — BASIC METABOLIC PANEL
Anion gap: 14 (ref 5–15)
BUN: 35 mg/dL — ABNORMAL HIGH (ref 6–23)
CO2: 26 mEq/L (ref 19–32)
Calcium: 7.9 mg/dL — ABNORMAL LOW (ref 8.4–10.5)
Chloride: 94 mEq/L — ABNORMAL LOW (ref 96–112)
Creatinine, Ser: 4.94 mg/dL — ABNORMAL HIGH (ref 0.50–1.35)
GFR, EST AFRICAN AMERICAN: 11 mL/min — AB (ref >90)
GFR, EST NON AFRICAN AMERICAN: 10 mL/min — AB (ref >90)
Glucose, Bld: 86 mg/dL (ref 70–99)
POTASSIUM: 5.1 meq/L (ref 3.7–5.3)
Sodium: 134 mEq/L — ABNORMAL LOW (ref 137–147)

## 2014-10-31 NOTE — Progress Notes (Signed)
[facility] Progress Note   Subjective: per dtr gets confused when temp goes up  Filed Vitals:   10/30/14 0415 10/30/14 2209 10/31/14 0522 10/31/14 1026  BP: 132/60 147/49 124/47   Pulse: 90 84 83   Temp: 99.3 F (37.4 C) 100.7 F (38.2 C) 98.9 F (37.2 C) 101.5 F (38.6 C)  TempSrc: Oral Oral Oral Oral  Resp: 20 21 18    Height:      Weight:      SpO2: 95% 99% 94%    Exam: Alert, elderly, chronically ill-appearing, no distress No jvd Chest clear bilat RRR 2/6 SEM, no RG Abd soft, NTND, +BS Trace bilat pretib edema, L thigh AVG patent Small wound lateral L ant thigh packed Neuro is nf, ox 3  HD: MWF [place] F160   77.5kg   2/3.5 Bath   L thigh AVG   Heparin 2000 Aranesp 200 / wk start 12/2 (was 160), Hectorol 3 ug, no Fe PTH 308, Ca/P wnl       Assessment: 1. CP / hx CABG - cath showed patent grafts 2. Recurrent fevers - felt to be due to L thigh AVG infection (wound cx +MRSA, s/p bedside I&D 11/28, on IV vanc) > to OR Sunday for AVG removal per [facility] 3. ESRD on HD 4. HTN/ volume - chronic hypotension on midodrine, down to dry wt 5. Anemia of CKD - prob ESA resistance due to chronic infection, s/p 2u prbc, cont max ESA 6. MBD - cont vit D, sensipar, renvela.  PTH down to 308 in Oct, Ca/P ok.  3.5 Ca bath dec'd to 2.5 by Dr Lorrene Reid here 7. MDS - pancytopenia from Revlimid, on hold. Weekly Granix, Dr Alvy Bimler following 8. Hx CVA 9. Hx recurrent bladder cancer   Plan - HD Monday    Kelly Splinter MD  pager [phone]    cell [phone]  10/31/2014, 1:29 PM     Recent Labs Lab 10/25/14 0224 10/26/14 0355 10/26/14 1205  10/29/14 0304 10/30/14 0320 10/31/14 0404  NA 135* 137 136*  < > 138 133* 134*  K 5.6* 5.7* 5.3  < > 4.9 5.6* 5.1  CL 96 95* 95*  < > 96 91* 94*  CO2 26 25 24   < > 27 24 26   GLUCOSE 91 79 114*  < > 98 89 86  BUN 51* 64* 67*  < > 29* 48* 35*  CREATININE 6.72* 8.70* 9.15*  < > 4.80* 6.96* 4.94*  CALCIUM 7.6* 7.5* 7.5*  < > 7.9*  7.8* 7.9*  PHOS 2.9 3.5 3.6  --   --   --   --   < > = values in this interval not displayed.  Recent Labs Lab 10/25/14 0224 10/26/14 0355 10/26/14 1205  ALBUMIN 2.9* 2.8* 2.8*    Recent Labs Lab 10/29/14 0304 10/30/14 0320 10/31/14 0404  WBC 2.4* 2.7* 2.3*  NEUTROABS 1.1* 1.3* 1.0*  HGB 10.1* 10.5* 9.6*  HCT 32.1* 32.6* 30.4*  MCV 103.5* 105.2* 104.8*  PLT 75* 83* 83*   . allopurinol  100 mg Oral BID  . aspirin EC  81 mg Oral QHS  . atorvastatin  10 mg Oral q1800  . cinacalcet  30 mg Oral Q breakfast  . darbepoetin (ARANESP) injection - DIALYSIS  200 mcg Intravenous Q Mon-HD  . doxercalciferol  3 mcg Intravenous Q M,W,F-HD  . feeding supplement (NEPRO CARB STEADY)  237 mL Oral BID BM  . gabapentin  100 mg Oral Daily  . gabapentin  200 mg Oral QHS  . heparin  4,000 Units Intravenous Once  . heparin  5,000 Units Subcutaneous 3 times per day  . midodrine  10 mg Oral TID WC  . piperacillin-tazobactam (ZOSYN)  IV  2.25 g Intravenous Q8H  . polyethylene glycol  17 g Oral QHS  . sevelamer carbonate  2,400 mg Oral TID WC  . sodium chloride  3 mL Intravenous Q12H  . vancomycin  1,250 mg Intravenous Q M,W,F-HD     sodium chloride, sodium chloride, sodium chloride, acetaminophen, albuterol, docusate sodium, feeding supplement (NEPRO CARB STEADY), heparin, heparin, lidocaine (PF), lidocaine-prilocaine, pentafluoroprop-tetrafluoroeth, sevelamer carbonate, sodium chloride

## 2014-10-31 NOTE — Progress Notes (Signed)
Subjective:  Patient has continued to have fevers every night for the last 3 nights. Patient feels like his doing overall well with a mild cough. Patient denies coughing up anything significant. Patient does not have any other complaints.  Objective: Vital signs in last 24 hours: Filed Vitals:   10/30/14 0415 10/30/14 2209 10/31/14 0522 10/31/14 1026  BP: 132/60 147/49 124/47   Pulse: 90 84 83   Temp: 99.3 F (37.4 C) 100.7 F (38.2 C) 98.9 F (37.2 C) 101.5 F (38.6 C)  TempSrc: Oral Oral Oral Oral  Resp: 20 21 18    Height:      Weight:      SpO2: 95% 99% 94%    Weight change:   Intake/Output Summary (Last 24 hours) at 10/31/14 1042 Last data filed at 10/30/14 1325  Gross per 24 hour  Intake    120 ml  Output      0 ml  Net    120 ml   Physical Exam  General: resting in bed, comfortable, NAD HEENT: Moist mucous membranes, hard of hearing Cardiac: RRR, no murmurs, rubs, or gallops Pulm: clear to auscultation Bilaterally Abd: soft, nondistended, BS present Ext: Left thigh graft wound no drainage, no significant purulence, graft with warmth and palpable thrill, no edema, no significant tenderness Neuro: Alert and oriented  Lab Results: Basic Metabolic Panel:  Recent Labs Lab 10/26/14 0355 10/26/14 1205  10/30/14 0320 10/31/14 0404  NA 137 136*  < > 133* 134*  K 5.7* 5.3  < > 5.6* 5.1  CL 95* 95*  < > 91* 94*  CO2 25 24  < > 24 26  GLUCOSE 79 114*  < > 89 86  BUN 64* 67*  < > 48* 35*  CREATININE 8.70* 9.15*  < > 6.96* 4.94*  CALCIUM 7.5* 7.5*  < > 7.8* 7.9*  PHOS 3.5 3.6  --   --   --   < > = values in this interval not displayed. Liver Function Tests:  Recent Labs Lab 10/26/14 0355 10/26/14 1205  ALBUMIN 2.8* 2.8*   CBC:  Recent Labs Lab 10/30/14 0320 10/31/14 0404  WBC 2.7* 2.3*  NEUTROABS 1.3* 1.0*  HGB 10.5* 9.6*  HCT 32.6* 30.4*  MCV 105.2* 104.8*  PLT 83* 83*    Micro Results: Recent Results (from the past 240 hour(s))  Culture,  blood (routine x 2)     Status: None   Collection Time: 10/23/14  1:20 PM  Result Value Ref Range Status   Specimen Description BLOOD DRAWN BY DIALYSIS  Final   Special Requests BOTTLES DRAWN AEROBIC AND ANAEROBIC 10CC  Final   Culture  Setup Time   Final    10/23/2014 22:43 Performed at [facility]    Culture   Final    NO GROWTH 5 DAYS Performed at [facility]    Report Status 10/29/2014 FINAL  Final  Wound culture     Status: None   Collection Time: 10/23/14  3:10 PM  Result Value Ref Range Status   Specimen Description WOUND LEFT THIGH  Final   Special Requests PT RECEIVED VANC ON 11/23  Final   Gram Stain   Final    FEW WBC PRESENT,BOTH PMN AND MONONUCLEAR NO SQUAMOUS EPITHELIAL CELLS SEEN NO ORGANISMS SEEN Performed at [facility]    Culture   Final    FEW METHICILLIN RESISTANT STAPHYLOCOCCUS AUREUS Note: RIFAMPIN AND GENTAMICIN SHOULD NOT BE USED AS SINGLE DRUGS FOR TREATMENT  OF STAPH INFECTIONS. This organism DOES NOT demonstrate inducible Clindamycin resistance in vitro. CRITICAL RESULT CALLED TO, READ BACK BY AND VERIFIED WITH: BRANDY D 11/30  @945  BY REAMM Performed at [facility]    Report Status 10/26/2014 FINAL  Final   Organism ID, Bacteria METHICILLIN RESISTANT STAPHYLOCOCCUS AUREUS  Final      Susceptibility   Methicillin resistant staphylococcus aureus - MIC*    CLINDAMYCIN <=0.25 SENSITIVE Sensitive     ERYTHROMYCIN >=8 RESISTANT Resistant     GENTAMICIN <=0.5 SENSITIVE Sensitive     LEVOFLOXACIN >=8 RESISTANT Resistant     OXACILLIN >=4 RESISTANT Resistant     PENICILLIN >=0.5 RESISTANT Resistant     RIFAMPIN <=0.5 SENSITIVE Sensitive     TRIMETH/SULFA <=10 SENSITIVE Sensitive     VANCOMYCIN 1 SENSITIVE Sensitive     TETRACYCLINE <=1 SENSITIVE Sensitive     * FEW METHICILLIN RESISTANT STAPHYLOCOCCUS AUREUS  Culture, blood (routine x 2)     Status: None   Collection Time: 10/23/14  3:10 PM  Result Value Ref Range  Status   Specimen Description BLOOD  Final   Special Requests BOTTLES DRAWN AEROBIC AND ANAEROBIC 10CC AVG  Final   Culture  Setup Time   Final    10/23/2014 22:42 Performed at [facility]    Culture   Final    NO GROWTH 5 DAYS Performed at [facility]    Report Status 10/29/2014 FINAL  Final  Culture, blood (routine x 2)     Status: None (Preliminary result)   Collection Time: 10/28/14 10:40 PM  Result Value Ref Range Status   Specimen Description BLOOD LEFT FOREARM  Final   Special Requests BOTTLES DRAWN AEROBIC AND ANAEROBIC 10CC  Final   Culture  Setup Time   Final    10/29/2014 04:29 Performed at [facility]    Culture   Final           BLOOD CULTURE RECEIVED NO GROWTH TO DATE CULTURE WILL BE HELD FOR 5 DAYS BEFORE ISSUING A FINAL NEGATIVE REPORT Performed at [facility]    Report Status PENDING  Incomplete  Culture, blood (routine x 2)     Status: None (Preliminary result)   Collection Time: 10/28/14 10:45 PM  Result Value Ref Range Status   Specimen Description BLOOD LEFT WRIST  Final   Special Requests BOTTLES DRAWN AEROBIC AND ANAEROBIC 5CC  Final   Culture  Setup Time   Final    10/29/2014 04:29 Performed at [facility]    Culture   Final           BLOOD CULTURE RECEIVED NO GROWTH TO DATE CULTURE WILL BE HELD FOR 5 DAYS BEFORE ISSUING A FINAL NEGATIVE REPORT Performed at [facility]    Report Status PENDING  Incomplete   Studies/Results: [place] Extrem [place]  10/29/2014   CLINICAL DATA:  Patient with history of revised left thigh dialysis graft, now with focal area of erythema. Evaluate for abscess.  EXAM: ULTRASOUND LEFT LOWER EXTREMITY LIMITED  TECHNIQUE: Ultrasound examination of the lower extremity soft tissues was performed in the area of clinical concern.  COMPARISON:  None  FINDINGS: Multiple grayscale and color flow images were provided of the patient's left upper thigh at the area of concern.  Additionally, real-time sonographic evaluation was performed by the dictating radiologist.  Acquired cinegraphic images demonstrate that the focal area of erythema within the patient's upper medial thigh corresponds with focal  dermal erosion superficial to the abandoned medial thigh dialysis graft. There is no associated organized fluid collection or abscess. This focal area of erythema is remote from the patient's functioning left thigh dialysis graft.  The lateral aspect of the patient's functioning left upper thigh dialysis graft appears widely patent where imaged.  IMPRESSION: 1. Focal area of erythema involving the left thigh corresponds to a focal area of dermal erosion superficial to the patient's abandoned left thigh dialysis graft. No associated organized fluid collection or abscess. 2. The lateral aspect of the patient's functioning left thigh graft appears widely patent were imaged.   Electronically Signed   By: Sandi Mariscal M.D.   On: 10/29/2014 16:25   Ct Extrem Lower W Cm Bil  10/30/2014   CLINICAL DATA:  He had short segment of nonfunctional left thigh graft removed on 10/01/2014. The area where the short segment of nonfunctional graft has had some drainage which was positive for MRSA. Up till now patient has been afebrile. Apparently had one temp to 101.3-axillary last p.m.On exam today the area where the segment of graft was removed is not inflamed and no evidence of proximal or distal cellulitis. However was able to compress the proximal segment of nonfunctional graft and had some purulent drainage.  EXAM: CT OF THE LOWER BILATERAL EXTREMITY WITH CONTRAST  TECHNIQUE: Multidetector CT imaging of the was performed according to the standard protocol following intravenous contrast administration.  COMPARISON:  None.  CONTRAST:  168mL OMNIPAQUE IOHEXOL 300 MG/ML  SOLN  FINDINGS: No acute fracture or dislocation. No lytic or blastic osseous lesion. No bone destruction. Tricompartmental osteoarthritis  of the knees bilaterally. Large left knee joint effusion.  There is diverticulosis without evidence of diverticulitis. There is atherosclerotic disease involving bilateral common iliac arteries and external iliac arteries.  There is a left femoral dialysis graft which is patent. There is hyperdense fragments of a abandoned fistula graft in the low left anterior thigh adjacent to the current non occluded graft. There is a small 11 mm hypodensity associated with the midportion of the abandoned left thigh graft just deep to the cutaneous surface which may represent a very small abscess versus low-density granulation material within the lumen. There is edema within the anterolateral aspect of the thigh within the subcutaneous fat. There is no focal drainable fluid collection.  There is an occluded right thigh AV fistula graft. There is peripheral vascular atherosclerotic disease.  IMPRESSION: 1. Patent left femoral AV fistula graft. Adjacently there is hyperdense fragments from an abandoned left anterior thigh AV fistula with a small 11 mm hypodensity associated with the midportion of the left thigh graft just deep to the dermis which may reflect a small abscess versus low-density material within the lumen. There is no focal fluid collection around be functioning left femoral AV fistula graft. There is no large drainable fluid collection. There is soft tissue edema in the anterolateral aspect of the left thigh which may be reactive versus secondary to mild cellulitis. 2. Tricompartmental osteoarthritis of bilateral knees. Large left knee joint effusion.   Electronically Signed   By: Kathreen Devoid   On: 10/30/2014 17:01    Medications: I have reviewed the patient's current medications. Scheduled Meds: . allopurinol  100 mg Oral BID  . aspirin EC  81 mg Oral QHS  . atorvastatin  10 mg Oral q1800  . cinacalcet  30 mg Oral Q breakfast  . darbepoetin (ARANESP) injection - DIALYSIS  200 mcg Intravenous Q Mon-HD  .  doxercalciferol  3 mcg Intravenous Q M,W,F-HD  . feeding supplement (NEPRO CARB STEADY)  237 mL Oral BID BM  . gabapentin  100 mg Oral Daily  . gabapentin  200 mg Oral QHS  . heparin  4,000 Units Intravenous Once  . heparin  5,000 Units Subcutaneous 3 times per day  . midodrine  10 mg Oral TID WC  . piperacillin-tazobactam (ZOSYN)  IV  2.25 g Intravenous Q8H  . polyethylene glycol  17 g Oral QHS  . sevelamer carbonate  2,400 mg Oral TID WC  . sodium chloride  3 mL Intravenous Q12H  . vancomycin  1,250 mg Intravenous Q M,W,F-HD   Continuous Infusions:   PRN Meds:.sodium chloride, sodium chloride, sodium chloride, acetaminophen, albuterol, docusate sodium, feeding supplement (NEPRO CARB STEADY), heparin, heparin, lidocaine (PF), lidocaine-prilocaine, pentafluoroprop-tetrafluoroeth, sevelamer carbonate, sodium chloride Assessment/Plan:  Patient is an 80 year old with end-stage renal disease on hemodialysis, myelodysplastic syndrome, combined systolic and diastolic congestive heart failure, hyperlipidemia, and peripheral neuropathy hospitalized for unstable angina status post cardiac catheterization without intervention and 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  MRSA graft site infection/fever: Patient has spiked a fever for the last 3 nights. The ultrasound graft site was negative for abscess, CT showing a 11 mm suspected small abscess associated with the midportion of the left thigh graft just deep to the dermis. Patient otherwise hemodynamically stable and satting well on room air. Patient continues to look well clinically.  -Vascular planning for operation on the morning of 11/01/2014 -Nothing by mouth at midnight -UA not able to be collected since patient is anuric -Continue vancomycin  -Added zosyn yesterday -Continue wet-to-dry dressing BID   Unstable angina: Patient status post cardiac catheterization with suspected small circumflex and diagonal branches as the etiology  for symptoms. However, intervention was not able to be performed. Will continue with risk profile modification.   - Continue with aspirin 81 mg and Lipitor 10 mg  Neutropenia: ANC has been overall stable, 1000 today, stable from last two days.   - Neutropenic precautions.  - Continue to monitor  ESRD: Patient receiving dialysis today. -Continue home Sevelamer, Hectorol, mitodrine, Nepro  MDS: Pancytopenia related to this condition, and treatment side effects of Renvelid. Hemoglobin is overall stable at 9.6.  - Continue to follow as an outpatient as this could also be an exacerbating factor for the patient's dyspnea.  Combined systolic and diastolic CHF: EF 31%, grade 2 diastolic dysfunction, akinesis of the basal-midinferolateral and inferior myocardium per echo (11/28). Wt 170, up from 169 on admission. Net -5.9 L since admission.  -Daily weights & strict I&Os as noted above -dialysis as above  Gout: Continue allopurinol 100mg .  HLD: Continue statin as noted above.  Peripheral neuropathy: Continue home gabapentin.  Presumed COPD: No PFTs on file though will continue home albuterol.  - Consider outpatient PFT  FEN:   -Diet: Heart healthy diet  DVT prophylaxis: Subcutaneous heparin  Resolved issues:  - Possible HCAP: was on a course of ceftazidime in addition to vancomycin  CODE STATUS: FULL CODE  Dispo: Disposition is deferred at this time, awaiting improvement of current medical problems.    CODE STATUS: FULL CODE -Confirmed with him at bedside on admission   Dispo: Disposition is deferred at this time, awaiting improvement of current medical problems.      The patient does have a current PCP Redmond School, MD) and does not need an [facility] hospital follow-up appointment after discharge.  The patient does have transportation limitations that  hinder transportation to clinic appointments.   LOS: 8 days   Luan Moore, MD 10/31/2014, 10:42 AM

## 2014-10-31 NOTE — Plan of Care (Signed)
Problem: Phase I Progression Outcomes Goal: Other Phase I Outcomes/Goals Outcome: Progressing     

## 2014-10-31 NOTE — Progress Notes (Addendum)
[facility]  Subjective  - No changes.  No new complaints.   Objective 124/47 83 98.9 F (37.2 C) (Oral) 18 94%  Intake/Output Summary (Last 24 hours) at 10/31/14 4982 Last data filed at 10/30/14 1325  Gross per 24 hour  Intake    120 ml  Output      0 ml  Net    120 ml    Left thigh graft with expressible drainage  No erythema   Assessment/Plann: Infected left thigh graft Cont. Fever And purulent drainage Plan OR for removal of infected graft 11/01/2014 by Dr. Fae Pippin, EMMA [facility] 10/31/2014 9:27 AM --  Laboratory Lab Results:  Recent Labs  10/30/14 0320 10/31/14 0404  WBC 2.7* 2.3*  HGB 10.5* 9.6*  HCT 32.6* 30.4*  PLT 83* 83*   BMET  Recent Labs  10/30/14 0320 10/31/14 0404  NA 133* 134*  K 5.6* 5.1  CL 91* 94*  CO2 24 26  GLUCOSE 89 86  BUN 48* 35*  CREATININE 6.96* 4.94*  CALCIUM 7.8* 7.9*    COAG Lab Results  Component Value Date   INR 1.22 10/26/2014   INR 1.25 06/27/2013   INR 1.10 10/08/2012   No results found for: PTT    I agree with the above.  Patient still having fevers.  I was able to express pus from his graft.  I will plan on removal of infected graft tomorrow, as the patient has eaten breakfast.  Risks and benefits discussed with patient and daughter.  Annamarie Major

## 2014-11-01 ENCOUNTER — Inpatient Hospital Stay (HOSPITAL_COMMUNITY): Payer: Medicare Other | Admitting: Anesthesiology

## 2014-11-01 ENCOUNTER — Encounter (HOSPITAL_COMMUNITY)
Admission: EM | Disposition: A | Discharge status: Skilled Nursing Facility | Payer: Self-pay | Source: Home / Self Care | Attending: Internal Medicine

## 2014-11-01 DIAGNOSIS — T82898A Other specified complication of vascular prosthetic devices, implants and grafts, initial encounter: Secondary | ICD-10-CM

## 2014-11-01 HISTORY — PX: REMOVAL OF GRAFT: SHX6361

## 2014-11-01 LAB — CBC WITH DIFFERENTIAL/PLATELET
Basophils Absolute: 0 10*3/uL (ref 0.0–0.1)
Basophils Relative: 1 % (ref 0–1)
EOS PCT: 5 % (ref 0–5)
Eosinophils Absolute: 0.1 10*3/uL (ref 0.0–0.7)
HCT: 28.7 % — ABNORMAL LOW (ref 39.0–52.0)
Hemoglobin: 9.4 g/dL — ABNORMAL LOW (ref 13.0–17.0)
Lymphocytes Relative: 40 % (ref 12–46)
Lymphs Abs: 0.9 10*3/uL (ref 0.7–4.0)
MCH: 34.3 pg — ABNORMAL HIGH (ref 26.0–34.0)
MCHC: 32.8 g/dL (ref 30.0–36.0)
MCV: 104.7 fL — ABNORMAL HIGH (ref 78.0–100.0)
MONOS PCT: 13 % — AB (ref 3–12)
Monocytes Absolute: 0.3 10*3/uL (ref 0.1–1.0)
NEUTROS PCT: 41 % — AB (ref 43–77)
Neutro Abs: 1 10*3/uL — ABNORMAL LOW (ref 1.7–7.7)
PLATELETS: 85 10*3/uL — AB (ref 150–400)
RBC: 2.74 MIL/uL — AB (ref 4.22–5.81)
RDW: 19.3 % — ABNORMAL HIGH (ref 11.5–15.5)
WBC: 2.3 10*3/uL — AB (ref 4.0–10.5)

## 2014-11-01 LAB — BLOOD GAS, ARTERIAL
Acid-Base Excess: 0.5 mmol/L (ref 0.0–2.0)
Bicarbonate: 25.3 mEq/L — ABNORMAL HIGH (ref 20.0–24.0)
DRAWN BY: 10006
O2 SAT: 99.2 %
PATIENT TEMPERATURE: 102.2
PH ART: 7.331 — AB (ref 7.350–7.450)
TCO2: 26.7 mmol/L (ref 0–100)
pCO2 arterial: 50.6 mmHg — ABNORMAL HIGH (ref 35.0–45.0)
pO2, Arterial: 246 mmHg — ABNORMAL HIGH (ref 80.0–100.0)

## 2014-11-01 LAB — BASIC METABOLIC PANEL
Anion gap: 16 — ABNORMAL HIGH (ref 5–15)
BUN: 54 mg/dL — AB (ref 6–23)
CO2: 24 mEq/L (ref 19–32)
Calcium: 7.5 mg/dL — ABNORMAL LOW (ref 8.4–10.5)
Chloride: 95 mEq/L — ABNORMAL LOW (ref 96–112)
Creatinine, Ser: 6.89 mg/dL — ABNORMAL HIGH (ref 0.50–1.35)
GFR calc Af Amer: 7 mL/min — ABNORMAL LOW (ref >90)
GFR, EST NON AFRICAN AMERICAN: 6 mL/min — AB (ref >90)
Glucose, Bld: 74 mg/dL (ref 70–99)
POTASSIUM: 6.1 meq/L — AB (ref 3.7–5.3)
SODIUM: 135 meq/L — AB (ref 137–147)

## 2014-11-01 LAB — PROTIME-INR
INR: 1.1 (ref 0.00–1.49)
Prothrombin Time: 14.3 seconds (ref 11.6–15.2)

## 2014-11-01 LAB — POCT I-STAT 4, (NA,K, GLUC, HGB,HCT)
Glucose, Bld: 80 mg/dL (ref 70–99)
HEMATOCRIT: 28 % — AB (ref 39.0–52.0)
Hemoglobin: 9.5 g/dL — ABNORMAL LOW (ref 13.0–17.0)
Potassium: 5.9 mEq/L — ABNORMAL HIGH (ref 3.7–5.3)
SODIUM: 132 meq/L — AB (ref 137–147)

## 2014-11-01 LAB — GLUCOSE, CAPILLARY: Glucose-Capillary: 104 mg/dL — ABNORMAL HIGH (ref 70–99)

## 2014-11-01 SURGERY — REMOVAL, GRAFT
Anesthesia: General | Site: Leg Upper | Laterality: Left

## 2014-11-01 MED ORDER — METHYLPREDNISOLONE SODIUM SUCC 125 MG IJ SOLR
80.0000 mg | Freq: Once | INTRAMUSCULAR | Status: AC
  Administered 2014-11-01: 80 mg via INTRAVENOUS
  Filled 2014-11-01: qty 1.28

## 2014-11-01 MED ORDER — OXYCODONE HCL 5 MG PO TABS
5.0000 mg | ORAL_TABLET | Freq: Once | ORAL | Status: AC | PRN
  Administered 2014-11-01: 5 mg via ORAL
  Filled 2014-11-01: qty 1

## 2014-11-01 MED ORDER — EPHEDRINE SULFATE 50 MG/ML IJ SOLN
INTRAMUSCULAR | Status: AC
  Filled 2014-11-01: qty 1

## 2014-11-01 MED ORDER — ACETAMINOPHEN 650 MG RE SUPP
RECTAL | Status: AC
  Filled 2014-11-01: qty 1

## 2014-11-01 MED ORDER — NALOXONE HCL 0.4 MG/ML IJ SOLN
INTRAMUSCULAR | Status: AC
  Administered 2014-11-01: 0.4 mg
  Filled 2014-11-01: qty 1

## 2014-11-01 MED ORDER — SUCCINYLCHOLINE CHLORIDE 20 MG/ML IJ SOLN
INTRAMUSCULAR | Status: AC
  Filled 2014-11-01: qty 1

## 2014-11-01 MED ORDER — HYDROMORPHONE HCL 1 MG/ML IJ SOLN
0.2500 mg | INTRAMUSCULAR | Status: DC | PRN
  Administered 2014-11-01 – 2014-11-03 (×2): 0.5 mg via INTRAVENOUS
  Filled 2014-11-01 (×2): qty 1

## 2014-11-01 MED ORDER — RACEPINEPHRINE HCL 2.25 % IN NEBU
0.5000 mL | INHALATION_SOLUTION | Freq: Once | RESPIRATORY_TRACT | Status: AC
  Administered 2014-11-01: 0.5 mL via RESPIRATORY_TRACT
  Filled 2014-11-01: qty 0.5

## 2014-11-01 MED ORDER — PROMETHAZINE HCL 25 MG/ML IJ SOLN: 6.2500 mg | INTRAMUSCULAR | Status: DC | PRN

## 2014-11-01 MED ORDER — PROPOFOL 10 MG/ML IV BOLUS
INTRAVENOUS | Status: DC | PRN
  Administered 2014-11-01: 100 mg via INTRAVENOUS

## 2014-11-01 MED ORDER — LIDOCAINE HCL (CARDIAC) 20 MG/ML IV SOLN
INTRAVENOUS | Status: AC
  Filled 2014-11-01: qty 5

## 2014-11-01 MED ORDER — SODIUM CHLORIDE 0.9 % IJ SOLN
INTRAMUSCULAR | Status: AC
  Filled 2014-11-01: qty 10

## 2014-11-01 MED ORDER — LIDOCAINE HCL (CARDIAC) 20 MG/ML IV SOLN
INTRAVENOUS | Status: DC | PRN
  Administered 2014-11-01: 50 mg via INTRAVENOUS

## 2014-11-01 MED ORDER — PROPOFOL 10 MG/ML IV BOLUS
INTRAVENOUS | Status: AC
  Filled 2014-11-01: qty 20

## 2014-11-01 MED ORDER — OXYCODONE HCL 5 MG/5ML PO SOLN: 5.0000 mg | Freq: Once | ORAL | Status: AC | PRN

## 2014-11-01 MED ORDER — DIPHENHYDRAMINE HCL 50 MG/ML IJ SOLN
25.0000 mg | Freq: Once | INTRAMUSCULAR | Status: AC
  Administered 2014-11-01: 25 mg via INTRAVENOUS
  Filled 2014-11-01: qty 1

## 2014-11-01 MED ORDER — ACETAMINOPHEN 650 MG RE SUPP
650.0000 mg | RECTAL | Status: DC | PRN
  Administered 2014-11-01: 20:00:00 via RECTAL
  Filled 2014-11-01: qty 1

## 2014-11-01 MED ORDER — ROCURONIUM BROMIDE 50 MG/5ML IV SOLN
INTRAVENOUS | Status: AC
  Filled 2014-11-01: qty 1

## 2014-11-01 MED ORDER — FENTANYL CITRATE 0.05 MG/ML IJ SOLN
INTRAMUSCULAR | Status: AC
  Filled 2014-11-01: qty 5

## 2014-11-01 MED ORDER — SODIUM CHLORIDE 0.9 % IV SOLN
10.0000 mg | INTRAVENOUS | Status: DC | PRN
  Administered 2014-11-01: 50 ug/min via INTRAVENOUS

## 2014-11-01 MED ORDER — PHENYLEPHRINE HCL 10 MG/ML IJ SOLN
INTRAMUSCULAR | Status: DC | PRN
  Administered 2014-11-01: 80 ug via INTRAVENOUS
  Administered 2014-11-01: 160 ug via INTRAVENOUS

## 2014-11-01 MED ORDER — FENTANYL CITRATE 0.05 MG/ML IJ SOLN
INTRAMUSCULAR | Status: DC | PRN
  Administered 2014-11-01: 50 ug via INTRAVENOUS

## 2014-11-01 MED ORDER — 0.9 % SODIUM CHLORIDE (POUR BTL) OPTIME
TOPICAL | Status: DC | PRN
  Administered 2014-11-01: 1000 mL

## 2014-11-01 SURGICAL SUPPLY — 25 items
CLIP TI MEDIUM 24 (CLIP) ×2 IMPLANT
CLIP TI WIDE RED SMALL 24 (CLIP) ×2 IMPLANT
COVER LIGHT HANDLE  DEROYL (MISCELLANEOUS) ×2 IMPLANT
ELECT REM PT RETURN 9FT ADLT (ELECTROSURGICAL) ×3
ELECTRODE REM PT RTRN 9FT ADLT (ELECTROSURGICAL) IMPLANT
GAUZE SPONGE 4X4 12PLY STRL (GAUZE/BANDAGES/DRESSINGS) ×2 IMPLANT
GLOVE BIO SURGEON STRL SZ7.5 (GLOVE) ×6 IMPLANT
GLOVE BIOGEL M 6.5 STRL (GLOVE) ×2 IMPLANT
GLOVE BIOGEL M 7.0 STRL (GLOVE) ×2 IMPLANT
GLOVE BIOGEL PI IND STRL 7.5 (GLOVE) IMPLANT
GLOVE BIOGEL PI INDICATOR 7.5 (GLOVE) ×8
IV SOD CHL 0.9% 1000ML (IV SOLUTION) ×2 IMPLANT
KIT BASIN OR (CUSTOM PROCEDURE TRAY) ×2 IMPLANT
KIT CLEAN ENDO COMPLIANCE (KITS) ×2 IMPLANT
PACK CV ACCESS (CUSTOM PROCEDURE TRAY) ×2 IMPLANT
SPONGE GAUZE 4X4 12PLY STER LF (GAUZE/BANDAGES/DRESSINGS) ×2 IMPLANT
SUT ETHILON 3 0 PS 1 (SUTURE) ×2 IMPLANT
SUT PROLENE 6 0 BV (SUTURE) ×4 IMPLANT
SUT VIC AB 3-0 SH 27 (SUTURE) ×3
SUT VIC AB 3-0 SH 27X BRD (SUTURE) IMPLANT
SUT VICRYL 4-0 PS2 18IN ABS (SUTURE) ×2 IMPLANT
SWAB CULTURE LIQUID MINI MALE (MISCELLANEOUS) ×2 IMPLANT
TAPE CLOTH SURG 4X10 WHT LF (GAUZE/BANDAGES/DRESSINGS) ×2 IMPLANT
TUBE ANAEROBIC SPECIMEN COL (MISCELLANEOUS) ×2 IMPLANT
WATER STERILE IRR 1000ML POUR (IV SOLUTION) ×2 IMPLANT

## 2014-11-01 NOTE — Progress Notes (Addendum)
Pt had 4 second pause.  Telemetry saved strip and MD was notified. Pt remained asymptomatic, asleep and is currently NSR at 96 bpm. MD instructed to page her again if pt had another pause.  Will continue to monitor.

## 2014-11-01 NOTE — Transfer of Care (Signed)
Immediate Anesthesia Transfer of Care Note  Patient: Andrew [name]  Procedure(s) Performed: Procedure(s) with comments: REMOVAL OF INFECTED LEFT THIGH GRAFT (Left) - removal of infected left dialysis graft.  Patient Location: PACU  Anesthesia Type:General  Level of Consciousness: sedated  Airway & Oxygen Therapy: Patient Spontanous Breathing and Patient connected to nasal cannula oxygen  Post-op Assessment: Report given to PACU RN, Post -op Vital signs reviewed and stable and Patient moving all extremities X 4  Post vital signs: Reviewed and stable  Complications: No apparent anesthesia complications

## 2014-11-01 NOTE — Plan of Care (Signed)
Problem: Phase II Progression Outcomes Goal: Other Phase II Outcomes/Goals Outcome: Completed/Met Date Met:  2014-02-06

## 2014-11-01 NOTE — Op Note (Signed)
    Patient name: Andrew [name] MRN: [id number] DOB: June 28, 1926 Sex: male  10/23/2014 - 11/01/2014 Pre-operative Diagnosis: infected left thigh dialysis graft Post-operative diagnosis:  Same Surgeon:  Eldridge Abrahams Assistants:  none Procedure:   Removal of left thigh infected dialysis graft Anesthesia:  Gen. Blood Loss:  See anesthesia record Specimens:  Graft was sent for culture.  Anaerobic and aerobic cultures were also sent  Findings:  Defunctionalized graft was removed in its entirety.  The tissue appeared healthy.  There was a small portion of the functioning dialysis graft that was exposed near the apex.  All the tissue appeared healthy without significant contamination  Indications:  The patient has had persistent fevers and purulent drainage from a defunctionalized limb of his left thigh graft which had been partially removed.  He is here today for full removal  Procedure:  The patient was identified in the holding area and taken to [facility]  The patient was then placed supine on the table. general anesthesia was administered.  The patient was prepped and draped in the usual sterile fashion.  A time out was called and antibiotics were administered.  A #10 blade was used to open the skin anterior to the defunctionalized limb of his dialysis graft.  Anaerobic and aerobic cultures were sent.  I dissected the graft back proximally.  It was well incorporated where it had been  Transected.  The graft itself was sent for culture.  In dissecting out the distal portion of the graft near the apex, I did visualize the existing functioning dialysis graft.  The tissue around this area was healthy.  There was no purulent drainage.  The graft at this level (functioning graft) appeared to be incorporated.  The tissue was then copiously irrigated.  I placed 2 vertical mattress sutures to close the tissue over the functioning graft and then packed the open wound with Betadine soaked gauze.  There  were no immediate complications.   Disposition:  To PACU in stable condition.   Theotis Burrow, M.D. [facility] Office: [phone] Pager:  [phone]

## 2014-11-01 NOTE — Anesthesia Procedure Notes (Signed)
Procedure Name: LMA Insertion Date/Time: 11/01/2014 7:43 AM Performed by: Neldon Newport Pre-anesthesia Checklist: Patient identified, Emergency Drugs available, Suction available, Patient being monitored and Timeout performed Patient Re-evaluated:Patient Re-evaluated prior to inductionOxygen Delivery Method: Circle system utilized Preoxygenation: Pre-oxygenation with 100% oxygen Intubation Type: IV induction LMA: LMA inserted LMA Size: 4.0 Tube type: Oral Placement Confirmation: positive ETCO2,  ETT inserted through vocal cords under direct vision and breath sounds checked- equal and bilateral Tube secured with: Tape Dental Injury: Teeth and Oropharynx as per pre-operative assessment

## 2014-11-01 NOTE — Progress Notes (Addendum)
[facility] Progress Note   Subjective: no change  Filed Vitals:   11/01/14 0915 11/01/14 0930 11/01/14 0945 11/01/14 1011  BP: 138/51 134/49 123/47 105/51  Pulse: 84 85 79 80  Temp:  98.3 F (36.8 C)  98 F (36.7 C)  TempSrc:    Axillary  Resp: 18 19 16    Height:      Weight:      SpO2: 100% 100% 100%    Exam: Alert, elderly, chronically ill-appearing, no distress No jvd Chest clear bilat RRR 2/6 SEM, no RG Abd soft, NTND, +BS Trace bilat pretib edema, L thigh AVG patent Small wound lateral L ant thigh packed Neuro is nf, ox 3  HD: MWF [place] F160   77.5kg   2/3.5 Bath   L thigh AVG   Heparin 2000 Aranesp 200 / wk start 12/2 (was 160), Hectorol 3 ug, no Fe PTH 308, Ca/P wnl       Assessment: 1. CP / hx CABG - cath showed patent grafts 2. Fevers / AVG drainage / +MRSA - to OR for AVG removal today 3. Hyperkalemia - persistent, be sure on renal diet after procedure today, low K bath w HD tomorrow 4. ESRD on HD - using medial limb of thigh AVG for dialysis now. Friday HD notes not in EPIC as system was down but pt did have HD Friday.  5. HTN/ volume - chronic hypotension on midodrine, down to dry wt 6. Anemia of CKD - prob ESA resistance due to chronic infection, s/p 2u prbc, cont max ESA 7. MBD - cont vit D, sensipar, renvela.  PTH down to 308 in Oct, Ca/P ok.  3.5 Ca bath dec'd to 2.5 by Dr Lorrene Reid here 8. MDS - pancytopenia from Revlimid, on hold. Weekly Granix, Dr Alvy Bimler following 9. Hx CVA 10. Hx recurrent bladder cancer   Plan - HD Monday. In OR now    Kelly Splinter MD  pager [phone]    cell [phone]  11/01/2014, 10:30 AM     Recent Labs Lab 10/26/14 0355 10/26/14 1205  10/30/14 0320 10/31/14 0404 11/01/14 0500 11/01/14 0725  NA 137 136*  < > 133* 134* 135* 132*  K 5.7* 5.3  < > 5.6* 5.1 6.1* 5.9*  CL 95* 95*  < > 91* 94* 95*  --   CO2 25 24  < > 24 26 24   --   GLUCOSE 79 114*  < > 89 86 74 80  BUN 64* 67*  < > 48* 35* 54*   --   CREATININE 8.70* 9.15*  < > 6.96* 4.94* 6.89*  --   CALCIUM 7.5* 7.5*  < > 7.8* 7.9* 7.5*  --   PHOS 3.5 3.6  --   --   --   --   --   < > = values in this interval not displayed.  Recent Labs Lab 10/26/14 0355 10/26/14 1205  ALBUMIN 2.8* 2.8*    Recent Labs Lab 10/30/14 0320 10/31/14 0404 11/01/14 0500 11/01/14 0725  WBC 2.7* 2.3* 2.3*  --   NEUTROABS 1.3* 1.0* 1.0*  --   HGB 10.5* 9.6* 9.4* 9.5*  HCT 32.6* 30.4* 28.7* 28.0*  MCV 105.2* 104.8* 104.7*  --   PLT 83* 83* 85*  --    . allopurinol  100 mg Oral BID  . aspirin EC  81 mg Oral QHS  . atorvastatin  10 mg Oral q1800  . cinacalcet  30 mg Oral Q breakfast  . darbepoetin (  ARANESP) injection - DIALYSIS  200 mcg Intravenous Q Mon-HD  . doxercalciferol  3 mcg Intravenous Q M,W,F-HD  . feeding supplement (NEPRO CARB STEADY)  237 mL Oral BID BM  . gabapentin  100 mg Oral Daily  . gabapentin  200 mg Oral QHS  . heparin  4,000 Units Intravenous Once  . heparin  5,000 Units Subcutaneous 3 times per day  . midodrine  10 mg Oral TID WC  . piperacillin-tazobactam (ZOSYN)  IV  2.25 g Intravenous Q8H  . polyethylene glycol  17 g Oral QHS  . sevelamer carbonate  2,400 mg Oral TID WC  . sodium chloride  3 mL Intravenous Q12H  . vancomycin  1,250 mg Intravenous Q M,W,F-HD     sodium chloride, sodium chloride, sodium chloride, acetaminophen, albuterol, docusate sodium, feeding supplement (NEPRO CARB STEADY), heparin, heparin, HYDROmorphone (DILAUDID) injection, lidocaine (PF), lidocaine-prilocaine, oxyCODONE **OR** oxyCODONE, pentafluoroprop-tetrafluoroeth, promethazine, sevelamer carbonate, sodium chloride

## 2014-11-01 NOTE — Plan of Care (Signed)
Problem: Phase I Progression Outcomes Goal: Initial discharge plan identified Outcome: Progressing     

## 2014-11-01 NOTE — Plan of Care (Signed)
Problem: Phase III Progression Outcomes Goal: Tolerating diet Outcome: Completed/Met Date Met:  11/01/14

## 2014-11-01 NOTE — Anesthesia Preprocedure Evaluation (Addendum)
Anesthesia Evaluation  Patient identified by MRN, date of birth, ID band  Reviewed: Allergy & Precautions, H&P , NPO status , Patient's Chart, lab work & pertinent test results  Airway Mallampati: II  TM Distance: >3 FB     Dental  (+) Dental Advidsory Given, Teeth Intact, Caps   Pulmonary shortness of breath and with exertion, asthma , former smoker,  breath sounds clear to auscultation        Cardiovascular hypertension, + angina with exertion + CAD, + Peripheral Vascular Disease and +CHF Rhythm:Regular Rate:Normal     Neuro/Psych  Neuromuscular disease    GI/Hepatic PUD, GERD-  Controlled,  Endo/Other    Renal/GU ESRFRenal disease     Musculoskeletal   Abdominal   Peds  Hematology  (+) anemia ,   Anesthesia Other Findings Study Conclusions  - Left ventricle: The cavity size was at the upper limits of normal. Wall thickness was normal. The estimated ejection fraction was 45%. There is akinesis of the basal-midinferolateral and inferior myocardium. Features are consistent with a pseudonormal left ventricular filling pattern, with concomitant abnormal relaxation and increased filling pressure (grade 2 diastolic dysfunction). - Aortic valve: Cusp separation was moderately reduced. There was moderate stenosis. Valve area (VTI): 1.15 cm^2. Valve area (Vmax): 1.1 cm^2. Valve area (Vmean): 1.14 cm^2. - Left atrium: The atrium was mildly dilated. - Right atrium: The atrium was mildly dilated.  Transthoracic echocardiography. M-mode, complete 2D, spectral Doppler, and color Doppler. Birthdate: Patient birthdate: 25-Apr-1926. Age: Patient is 80 yr old. Sex: Gender: male. BMI: 25 kg/m^2. Blood pressure:   133/57 Patient status: Inpatient. Study date: Study date: 10/24/2014. Study time: 09:24 AM. Location: Bedside.    Reproductive/Obstetrics                           Anesthesia Physical Anesthesia Plan  ASA: III  Anesthesia Plan: General   Post-op Pain Management:    Induction: Intravenous  Airway Management Planned: LMA and Oral ETT  Additional Equipment:   Intra-op Plan:   Post-operative Plan: Extubation in OR  Informed Consent: I have reviewed the patients History and Physical, chart, labs and discussed the procedure including the risks, benefits and alternatives for the proposed anesthesia with the patient or authorized representative who has indicated his/her understanding and acceptance.   Dental Advisory Given  Plan Discussed with: Anesthesiologist, CRNA and Surgeon  Anesthesia Plan Comments:        Anesthesia Quick Evaluation

## 2014-11-01 NOTE — Progress Notes (Signed)
Patient taken to OR. Daughter by the side.

## 2014-11-01 NOTE — Progress Notes (Signed)
Subjective  -   No acute changes   Physical Exam:  Pus from graft ion left thigh       Assessment/Plan:   For graft removal  Andrew [name], Andrew [name] 11/01/2014 6:58 AM --  Filed Vitals:   11/01/14 0419  BP: 132/50  Pulse: 85  Temp: 98.2 F (36.8 C)  Resp: 18    Intake/Output Summary (Last 24 hours) at 11/01/14 0658 Last data filed at 10/31/14 1836  Gross per 24 hour  Intake    120 ml  Output      0 ml  Net    120 ml     Laboratory CBC    Component Value Date/Time   WBC 2.3* 11/01/2014 0500   WBC 1.7* 10/20/2014 1052   HGB 9.4* 11/01/2014 0500   HGB 8.2* 10/20/2014 1052   HCT 28.7* 11/01/2014 0500   HCT 25.8* 10/20/2014 1052   PLT 85* 11/01/2014 0500   PLT 82* 10/20/2014 1052    BMET    Component Value Date/Time   NA 135* 11/01/2014 0500   NA 142 08/26/2013 1119   K 6.1* 11/01/2014 0500   K 4.1 08/26/2013 1119   K 6.0 03/23/2011 0912   CL 95* 11/01/2014 0500   CO2 24 11/01/2014 0500   CO2 33* 08/26/2013 1119   GLUCOSE 74 11/01/2014 0500   GLUCOSE 104 08/26/2013 1119   BUN 54* 11/01/2014 0500   BUN 26.8* 08/26/2013 1119   CREATININE 6.89* 11/01/2014 0500   CREATININE 5.3* 08/26/2013 1119   CALCIUM 7.5* 11/01/2014 0500   CALCIUM 8.1* 08/26/2013 1119   CALCIUM 7.8* 03/13/2012 0527   GFRNONAA 6* 11/01/2014 0500   GFRAA 7* 11/01/2014 0500    COAG Lab Results  Component Value Date   INR 1.10 11/01/2014   INR 1.22 10/26/2014   INR 1.25 06/27/2013   No results found for: PTT  Antibiotics Anti-infectives    Start     Dose/Rate Route Frequency Ordered Stop   10/30/14 1600  piperacillin-tazobactam (ZOSYN) IVPB 2.25 g     2.25 g100 mL/hr over 30 Minutes Intravenous Every 8 hours 10/30/14 1514     10/30/14 1200  vancomycin (VANCOCIN) 1,250 mg in sodium chloride 0.9 % 250 mL IVPB     1,250 mg250 mL/hr over 60 Minutes Intravenous Every M-W-F (Hemodialysis) 10/28/14 2119     10/28/14 2145  vancomycin (VANCOCIN) 1,250 mg in sodium  chloride 0.9 % 250 mL IVPB     1,250 mg166.7 mL/hr over 90 Minutes Intravenous  Once 10/28/14 2119 10/29/14 0102   10/28/14 1300  doxycycline (VIBRA-TABS) tablet 100 mg  Status:  Discontinued     100 mg Oral Every 12 hours 10/28/14 1152 10/28/14 2102   10/26/14 1600  cefTAZidime (FORTAZ) 2 g in dextrose 5 % 50 mL IVPB  Status:  Discontinued     2 g100 mL/hr over 30 Minutes Intravenous Every M-W-F (Hemodialysis) 10/26/14 1326 10/27/14 1143   10/26/14 1200  vancomycin (VANCOCIN) 1,250 mg in sodium chloride 0.9 % 250 mL IVPB  Status:  Discontinued     1,250 mg250 mL/hr over 60 Minutes Intravenous Every M-W-F (Hemodialysis) 10/26/14 0855 10/28/14 1150   10/24/14 1100  cefTAZidime (FORTAZ) 2 g in dextrose 5 % 50 mL IVPB     2 g100 mL/hr over 30 Minutes Intravenous  Once 10/24/14 1047 10/24/14 1226   10/23/14 1630  cefTAZidime (FORTAZ) 2 g in dextrose 5 % 50 mL IVPB  Status:  Discontinued  2 g100 mL/hr over 30 Minutes Intravenous Every M-W-F (Hemodialysis) 10/23/14 1505 10/26/14 1124   10/23/14 1600  vancomycin (VANCOCIN) IVPB 1000 mg/200 mL premix  Status:  Discontinued     1,000 mg200 mL/hr over 60 Minutes Intravenous Every M-W-F (Hemodialysis) 10/23/14 1559 10/26/14 0855   10/23/14 1545  vancomycin (VANCOCIN) 1,750 mg in sodium chloride 0.9 % 250 mL IVPB  Status:  Discontinued     1,750 mg250 mL/hr over 60 Minutes Intravenous To Hemodialysis 10/23/14 1530 10/23/14 1540   10/23/14 1530  vancomycin (VANCOCIN) 1,750 mg in sodium chloride 0.9 % 500 mL IVPB  Status:  Discontinued     1,750 mg250 mL/hr over 120 Minutes Intravenous To Hemodialysis 10/23/14 1528 10/23/14 1530       V. Leia Alf, M.D. [facility] Office: [phone] Pager:  [phone]

## 2014-11-01 NOTE — Progress Notes (Signed)
Subjective:  Patient is status post old graft removal in his left thigh, after spiking fevers for the last 3 days. Patient is reporting no complaints this morning. Patient states that he has an appetite.  Objective: Vital signs in last 24 hours: Filed Vitals:   11/01/14 1045 11/01/14 1100 11/01/14 1115 11/01/14 1130  BP: 99/42 101/46 106/47 104/40  Pulse: 78 80 79 82  Temp:    97.5 F (36.4 C)  TempSrc:    Axillary  Resp:      Height:      Weight:      SpO2: 94% 100% 96% 94%   Weight change:   Intake/Output Summary (Last 24 hours) at 11/01/14 1317 Last data filed at 11/01/14 0900  Gross per 24 hour  Intake    370 ml  Output      0 ml  Net    370 ml   Physical Exam  General: resting in bed, comfortable, NAD HEENT: Moist mucous membranes, hard of hearing Cardiac: 2/6 systolic murmur, no rubs or gallops Pulm: Bibasilar Rales Abd: soft, nondistended, BS present Ext: Left thigh status post surgery with dressing clean and intact Neuro: Alert and oriented  Lab Results: Basic Metabolic Panel:  Recent Labs Lab 10/26/14 0355 10/26/14 1205  10/31/14 0404 11/01/14 0500 11/01/14 0725  NA 137 136*  < > 134* 135* 132*  K 5.7* 5.3  < > 5.1 6.1* 5.9*  CL 95* 95*  < > 94* 95*  --   CO2 25 24  < > 26 24  --   GLUCOSE 79 114*  < > 86 74 80  BUN 64* 67*  < > 35* 54*  --   CREATININE 8.70* 9.15*  < > 4.94* 6.89*  --   CALCIUM 7.5* 7.5*  < > 7.9* 7.5*  --   PHOS 3.5 3.6  --   --   --   --   < > = values in this interval not displayed. Liver Function Tests:  Recent Labs Lab 10/26/14 0355 10/26/14 1205  ALBUMIN 2.8* 2.8*   CBC:  Recent Labs Lab 10/31/14 0404 11/01/14 0500 11/01/14 0725  WBC 2.3* 2.3*  --   NEUTROABS 1.0* 1.0*  --   HGB 9.6* 9.4* 9.5*  HCT 30.4* 28.7* 28.0*  MCV 104.8* 104.7*  --   PLT 83* 85*  --     Micro Results: Recent Results (from the past 240 hour(s))  Culture, blood (routine x 2)     Status: None   Collection Time: 10/23/14  1:20 PM   Result Value Ref Range Status   Specimen Description BLOOD DRAWN BY DIALYSIS  Final   Special Requests BOTTLES DRAWN AEROBIC AND ANAEROBIC 10CC  Final   Culture  Setup Time   Final    10/23/2014 22:43 Performed at [place]   Final    NO GROWTH 5 DAYS Performed at [facility]    Report Status 10/29/2014 FINAL  Final  Wound culture     Status: None   Collection Time: 10/23/14  3:10 PM  Result Value Ref Range Status   Specimen Description WOUND LEFT THIGH  Final   Special Requests PT RECEIVED VANC ON 11/23  Final   Gram Stain   Final    FEW WBC PRESENT,BOTH PMN AND MONONUCLEAR NO SQUAMOUS EPITHELIAL CELLS SEEN NO ORGANISMS SEEN Performed at [facility]    Culture   Final    FEW METHICILLIN RESISTANT  STAPHYLOCOCCUS AUREUS Note: RIFAMPIN AND GENTAMICIN SHOULD NOT BE USED AS SINGLE DRUGS FOR TREATMENT OF STAPH INFECTIONS. This organism DOES NOT demonstrate inducible Clindamycin resistance in vitro. CRITICAL RESULT CALLED TO, READ BACK BY AND VERIFIED WITH: BRANDY D 11/30  @945  BY REAMM Performed at [facility]    Report Status 10/26/2014 FINAL  Final   Organism ID, Bacteria METHICILLIN RESISTANT STAPHYLOCOCCUS AUREUS  Final      Susceptibility   Methicillin resistant staphylococcus aureus - MIC*    CLINDAMYCIN <=0.25 SENSITIVE Sensitive     ERYTHROMYCIN >=8 RESISTANT Resistant     GENTAMICIN <=0.5 SENSITIVE Sensitive     LEVOFLOXACIN >=8 RESISTANT Resistant     OXACILLIN >=4 RESISTANT Resistant     PENICILLIN >=0.5 RESISTANT Resistant     RIFAMPIN <=0.5 SENSITIVE Sensitive     TRIMETH/SULFA <=10 SENSITIVE Sensitive     VANCOMYCIN 1 SENSITIVE Sensitive     TETRACYCLINE <=1 SENSITIVE Sensitive     * FEW METHICILLIN RESISTANT STAPHYLOCOCCUS AUREUS  Culture, blood (routine x 2)     Status: None   Collection Time: 10/23/14  3:10 PM  Result Value Ref Range Status   Specimen Description BLOOD  Final   Special Requests BOTTLES  DRAWN AEROBIC AND ANAEROBIC 10CC AVG  Final   Culture  Setup Time   Final    10/23/2014 22:42 Performed at [facility]    Culture   Final    NO GROWTH 5 DAYS Performed at [facility]    Report Status 10/29/2014 FINAL  Final  Culture, blood (routine x 2)     Status: None (Preliminary result)   Collection Time: 10/28/14 10:40 PM  Result Value Ref Range Status   Specimen Description BLOOD LEFT FOREARM  Final   Special Requests BOTTLES DRAWN AEROBIC AND ANAEROBIC 10CC  Final   Culture  Setup Time   Final    10/29/2014 04:29 Performed at [facility]    Culture   Final           BLOOD CULTURE RECEIVED NO GROWTH TO DATE CULTURE WILL BE HELD FOR 5 DAYS BEFORE ISSUING A FINAL NEGATIVE REPORT Performed at [facility]    Report Status PENDING  Incomplete  Culture, blood (routine x 2)     Status: None (Preliminary result)   Collection Time: 10/28/14 10:45 PM  Result Value Ref Range Status   Specimen Description BLOOD LEFT WRIST  Final   Special Requests BOTTLES DRAWN AEROBIC AND ANAEROBIC 5CC  Final   Culture  Setup Time   Final    10/29/2014 04:29 Performed at [facility]    Culture   Final           BLOOD CULTURE RECEIVED NO GROWTH TO DATE CULTURE WILL BE HELD FOR 5 DAYS BEFORE ISSUING A FINAL NEGATIVE REPORT Performed at [facility]    Report Status PENDING  Incomplete   Studies/Results: Ct Extrem Lower W Cm Bil  10/30/2014   CLINICAL DATA:  He had short segment of nonfunctional left thigh graft removed on 10/01/2014. The area where the short segment of nonfunctional graft has had some drainage which was positive for MRSA. Up till now patient has been afebrile. Apparently had one temp to 101.3-axillary last p.m.On exam today the area where the segment of graft was removed is not inflamed and no evidence of proximal or distal cellulitis. However was able to compress the proximal segment of nonfunctional graft and had some purulent  drainage.  EXAM: CT OF THE LOWER BILATERAL EXTREMITY WITH CONTRAST  TECHNIQUE: Multidetector CT imaging of the was performed according to the standard protocol following intravenous contrast administration.  COMPARISON:  None.  CONTRAST:  113mL OMNIPAQUE IOHEXOL 300 MG/ML  SOLN  FINDINGS: No acute fracture or dislocation. No lytic or blastic osseous lesion. No bone destruction. Tricompartmental osteoarthritis of the knees bilaterally. Large left knee joint effusion.  There is diverticulosis without evidence of diverticulitis. There is atherosclerotic disease involving bilateral common iliac arteries and external iliac arteries.  There is a left femoral dialysis graft which is patent. There is hyperdense fragments of a abandoned fistula graft in the low left anterior thigh adjacent to the current non occluded graft. There is a small 11 mm hypodensity associated with the midportion of the abandoned left thigh graft just deep to the cutaneous surface which may represent a very small abscess versus low-density granulation material within the lumen. There is edema within the anterolateral aspect of the thigh within the subcutaneous fat. There is no focal drainable fluid collection.  There is an occluded right thigh AV fistula graft. There is peripheral vascular atherosclerotic disease.  IMPRESSION: 1. Patent left femoral AV fistula graft. Adjacently there is hyperdense fragments from an abandoned left anterior thigh AV fistula with a small 11 mm hypodensity associated with the midportion of the left thigh graft just deep to the dermis which may reflect a small abscess versus low-density material within the lumen. There is no focal fluid collection around be functioning left femoral AV fistula graft. There is no large drainable fluid collection. There is soft tissue edema in the anterolateral aspect of the left thigh which may be reactive versus secondary to mild cellulitis. 2. Tricompartmental osteoarthritis of bilateral  knees. Large left knee joint effusion.   Electronically Signed   By: Kathreen Devoid   On: 10/30/2014 17:01    Medications: I have reviewed the patient's current medications. Scheduled Meds: . allopurinol  100 mg Oral BID  . aspirin EC  81 mg Oral QHS  . atorvastatin  10 mg Oral q1800  . cinacalcet  30 mg Oral Q breakfast  . darbepoetin (ARANESP) injection - DIALYSIS  200 mcg Intravenous Q Mon-HD  . doxercalciferol  3 mcg Intravenous Q M,W,F-HD  . feeding supplement (NEPRO CARB STEADY)  237 mL Oral BID BM  . gabapentin  100 mg Oral Daily  . gabapentin  200 mg Oral QHS  . heparin  4,000 Units Intravenous Once  . heparin  5,000 Units Subcutaneous 3 times per day  . midodrine  10 mg Oral TID WC  . piperacillin-tazobactam (ZOSYN)  IV  2.25 g Intravenous Q8H  . polyethylene glycol  17 g Oral QHS  . sevelamer carbonate  2,400 mg Oral TID WC  . sodium chloride  3 mL Intravenous Q12H  . vancomycin  1,250 mg Intravenous Q M,W,F-HD   Continuous Infusions:   PRN Meds:.sodium chloride, sodium chloride, sodium chloride, acetaminophen, albuterol, docusate sodium, feeding supplement (NEPRO CARB STEADY), heparin, heparin, HYDROmorphone (DILAUDID) injection, lidocaine (PF), lidocaine-prilocaine, pentafluoroprop-tetrafluoroeth, promethazine, sevelamer carbonate, sodium chloride Assessment/Plan:  Patient is an 80 year old with end-stage renal disease on hemodialysis, myelodysplastic syndrome, combined systolic and diastolic congestive heart failure, hyperlipidemia, and peripheral neuropathy hospitalized for unstable angina status post cardiac catheterization without intervention and 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  MRSA graft site infection/fever: After spiking fevers for the last 3 days, patient was found to have a 11 mm suspected small abscess imaging  and is now status post excision of left thigh graft by vascular surgery. Patient has since been afebrile and hemodynamically  stable. -Resume diet. -Consider transitioning off zosyn tomorrow if given stability. -Continue vancomycin. -Continue wet-to-dry dressing BID   Unstable angina: Patient status post cardiac catheterization with suspected small circumflex and diagonal branches as the etiology for symptoms. However, intervention was not able to be performed. Will continue with risk profile modification.   - Continue with aspirin 81 mg and Lipitor 10 mg  Neutropenia: ANC has been overall stable, 1000 today, stable from last three days.   - Neutropenic precautions.  - Continue to monitor  ESRD: Patient receiving dialysis today. -Continue home Sevelamer, Hectorol, mitodrine, Nepro  MDS: Pancytopenia related to this condition, and treatment side effects of Renvelid. Hemoglobin is overall stable at 9.6.  - Continue to follow as an outpatient as this could also be an exacerbating factor for the patient's dyspnea.  Combined systolic and diastolic CHF: EF 96%, grade 2 diastolic dysfunction, akinesis of the basal-midinferolateral and inferior myocardium per echo (11/28). Wt 170, up from 169 on admission. Net -4.5 L since admission.  -Daily weights & strict I&Os as noted above -dialysis as above  Gout: Continue allopurinol 100mg .  HLD: Continue statin as noted above.  Peripheral neuropathy: Continue home gabapentin.  Presumed COPD: No PFTs on file though will continue home albuterol.  - Consider outpatient PFT  FEN:   -Diet: Heart healthy diet  DVT prophylaxis: Subcutaneous heparin  Resolved issues:  - Possible HCAP: was on a course of ceftazidime in addition to vancomycin  CODE STATUS: FULL CODE  Dispo: Disposition is deferred at this time, awaiting improvement of current medical problems.      The patient does have a current PCP Redmond School, MD) and does not need an [facility] hospital follow-up appointment after discharge.  The patient does have transportation limitations that hinder transportation to  clinic appointments.   LOS: 9 days   Luan Moore, MD 11/01/2014, 1:17 PM

## 2014-11-01 NOTE — Progress Notes (Signed)
INTERNAL MEDICINE TEACHING SERVICE Night Float Progress Note   Subjective:    We were called overnight by the RN for evaluation of acute change of clinical status with increased shortness of breath fevers associated with rigors. Rapid response was called to evaluate the patient. At time of evaluation, pt on nonrebreather appearing to be in respiratory distress with stridor. Earlier today, the patient had been under general anesthesia for removal of a left thigh infected dialysis graft.    Objective:    BP 140/55 mmHg  Pulse 98  Temp(Src) 97.9 F (36.6 C) (Oral)  Resp 18  Ht 5\' 8"  (1.727 m)  Wt 170 lb 6.7 oz (77.3 kg)  BMI 25.92 kg/m2  SpO2 97%   Labs: Basic Metabolic Panel:    Component Value Date/Time   NA 132* 11/01/2014 0725   NA 142 08/26/2013 1119   K 5.9* 11/01/2014 0725   K 4.1 08/26/2013 1119   K 6.0 03/23/2011 0912   CL 95* 11/01/2014 0500   CO2 24 11/01/2014 0500   CO2 33* 08/26/2013 1119   BUN 54* 11/01/2014 0500   BUN 26.8* 08/26/2013 1119   CREATININE 6.89* 11/01/2014 0500   CREATININE 5.3* 08/26/2013 1119   GLUCOSE 80 11/01/2014 0725   GLUCOSE 104 08/26/2013 1119   CALCIUM 7.5* 11/01/2014 0500   CALCIUM 8.1* 08/26/2013 1119   CALCIUM 7.8* 03/13/2012 0527    CBC:    Component Value Date/Time   WBC 2.3* 11/01/2014 0500   WBC 1.7* 10/20/2014 1052   HGB 9.5* 11/01/2014 0725   HGB 8.2* 10/20/2014 1052   HCT 28.0* 11/01/2014 0725   HCT 25.8* 10/20/2014 1052   PLT 85* 11/01/2014 0500   PLT 82* 10/20/2014 1052   MCV 104.7* 11/01/2014 0500   MCV 112.9* 10/20/2014 1052   MCV 102 03/23/2011 0909   NEUTROABS 1.0* 11/01/2014 0500   NEUTROABS 0.4* 10/20/2014 1052   LYMPHSABS 0.9 11/01/2014 0500   LYMPHSABS 0.8* 10/20/2014 1052   MONOABS 0.3 11/01/2014 0500   MONOABS 0.3 10/20/2014 1052   EOSABS 0.1 11/01/2014 0500   EOSABS 0.1 10/20/2014 1052   BASOSABS 0.0 11/01/2014 0500   BASOSABS 0.0 10/20/2014 1052    Cardiac Enzymes: Lab Results  Component  Value Date   CKTOTAL 58 10/19/2012   CKMB 3.3 04/22/2012   TROPONINI <0.30 10/28/2014    Physical Exam: General: Vital signs reviewed and noted. Elderly gentleman in increased respiratory distress. He is not able to speak but responds to questions nonverbally. Is on nonrebreather. Rapid response nurse at bedside. 3 daughters present. Has rigors.   Lungs:  Normal respiratory effort. Clear to auscultation BL without crackles or wheezes. Marked stridor otherwise, oropharynx is patent.   Heart: RRR. S1 and S2 normal without gallop, murmur, or rubs.  Abdomen:  BS normoactive. Soft, Nondistended, non-tender.  No masses or organomegaly.  Extremities: No pretibial edema. Surgical site on left thigh is clean without active bleeding.      Assessment/ Plan:    Upper airway obstruction: Most likely secondary to recent intubation with LMA leading to possible laryngeal edema. Stridor is present but patient does not have wheezing or rales on chest exam. Plan - IV Solu-Medrol 80 mg stat. - IV Benadryl 25 mg stat - Nebulized racepinephrine with albuterol X1 - Supplemental oxygen - patient significantly improved after the above treatment and he was transitioned from nonrebreather to nasal cannula and finally to room air. - will continue to clinically monitor the patient -Discussed with the family about  plan of care, and all questions answered.  Fevers: Temp at 101.9. Patient has rigors. Blood cultures have been negative. Had removal of the left thigh infected dialysis graft this morning.  Plan  Recommended turning down the temperature in the room and body exposure. Gave rectal Tylenol. Ordered blood cultures given his recent surgery with graft removal that can potentially lead to bacterial seeding.  Will continue with the current regimen of Zosyn and vancomycin.   Jessee Avers, MD  11/01/2014, 9:42 PM

## 2014-11-01 NOTE — Progress Notes (Signed)
This evening the pt's daughter called out to say the pt "did not seem like himself".  I assessed the pt and found him to be rigorous, diaphoretic, red in the face, and had respiratory stridor.  Pt was also not responding in the typical way he was at baseline. I initiated rapid response and notified the MD.  After MD orders were carried out pt is now resting comfortably with no visible signs of distress.  Daughter is at the bedside. Pt remaining 100% on room air.

## 2014-11-01 NOTE — Anesthesia Postprocedure Evaluation (Signed)
  Anesthesia Post-op Note  Patient: Andrew [name]  Procedure(s) Performed: Procedure(s) with comments: REMOVAL OF INFECTED LEFT THIGH GRAFT (Left) - removal of infected left dialysis graft.  Patient Location: PACU  Anesthesia Type:General  Level of Consciousness: awake and sedated  Airway and Oxygen Therapy: Patient Spontanous Breathing  Post-op Pain: none  Post-op Assessment: Post-op Vital signs reviewed and Patient's Cardiovascular Status Stable  Post-op Vital Signs: stable  Last Vitals:  Filed Vitals:   11/01/14 0851  BP: 127/51  Pulse: 83  Temp: 37.1 C  Resp: 15    Complications: No apparent anesthesia complications

## 2014-11-02 ENCOUNTER — Encounter (HOSPITAL_COMMUNITY): Payer: Self-pay | Admitting: Surgery

## 2014-11-02 LAB — DIFFERENTIAL
BASOS PCT: 0 % (ref 0–1)
Basophils Absolute: 0 10*3/uL (ref 0.0–0.1)
EOS ABS: 0 10*3/uL (ref 0.0–0.7)
Eosinophils Relative: 0 % (ref 0–5)
Lymphocytes Relative: 19 % (ref 12–46)
Lymphs Abs: 0.2 10*3/uL — ABNORMAL LOW (ref 0.7–4.0)
Monocytes Absolute: 0 10*3/uL — ABNORMAL LOW (ref 0.1–1.0)
Monocytes Relative: 3 % (ref 3–12)
NEUTROS PCT: 78 % — AB (ref 43–77)
Neutro Abs: 1 10*3/uL — ABNORMAL LOW (ref 1.7–7.7)

## 2014-11-02 LAB — BASIC METABOLIC PANEL
ANION GAP: 14 (ref 5–15)
Anion gap: 21 — ABNORMAL HIGH (ref 5–15)
BUN: 76 mg/dL — AB (ref 6–23)
BUN: 38 mg/dL — ABNORMAL HIGH (ref 6–23)
CALCIUM: 7.6 mg/dL — AB (ref 8.4–10.5)
CALCIUM: 8.1 mg/dL — AB (ref 8.4–10.5)
CHLORIDE: 91 meq/L — AB (ref 96–112)
CO2: 22 mEq/L (ref 19–32)
CO2: 28 meq/L (ref 19–32)
CREATININE: 8.98 mg/dL — AB (ref 0.50–1.35)
Chloride: 97 mEq/L (ref 96–112)
Creatinine, Ser: 4.61 mg/dL — ABNORMAL HIGH (ref 0.50–1.35)
GFR calc Af Amer: 12 mL/min — ABNORMAL LOW (ref >90)
GFR calc non Af Amer: 5 mL/min — ABNORMAL LOW (ref >90)
GFR calc non Af Amer: 10 mL/min — ABNORMAL LOW (ref >90)
GFR, EST AFRICAN AMERICAN: 5 mL/min — AB (ref >90)
Glucose, Bld: 169 mg/dL — ABNORMAL HIGH (ref 70–99)
Glucose, Bld: 119 mg/dL — ABNORMAL HIGH (ref 70–99)
Potassium: >7.7 mEq/L (ref 3.7–5.3)
Potassium: 4.7 mEq/L (ref 3.7–5.3)
Sodium: 134 mEq/L — ABNORMAL LOW (ref 137–147)
Sodium: 139 mEq/L (ref 137–147)

## 2014-11-02 LAB — CBC
HEMATOCRIT: 32.5 % — AB (ref 39.0–52.0)
Hemoglobin: 10.4 g/dL — ABNORMAL LOW (ref 13.0–17.0)
MCH: 33.2 pg (ref 26.0–34.0)
MCHC: 32 g/dL (ref 30.0–36.0)
MCV: 103.8 fL — ABNORMAL HIGH (ref 78.0–100.0)
Platelets: 87 10*3/uL — ABNORMAL LOW (ref 150–400)
RBC: 3.13 MIL/uL — ABNORMAL LOW (ref 4.22–5.81)
RDW: 18.8 % — AB (ref 11.5–15.5)
WBC: 1.2 10*3/uL — CL (ref 4.0–10.5)

## 2014-11-02 LAB — GLUCOSE, RANDOM: GLUCOSE: 158 mg/dL — AB (ref 70–99)

## 2014-11-02 LAB — GLUCOSE, CAPILLARY
GLUCOSE-CAPILLARY: 188 mg/dL — AB (ref 70–99)
Glucose-Capillary: 113 mg/dL — ABNORMAL HIGH (ref 70–99)

## 2014-11-02 MED ORDER — LIDOCAINE HCL (PF) 1 % IJ SOLN: 5.0000 mL | INTRAMUSCULAR | Status: DC | PRN

## 2014-11-02 MED ORDER — LIDOCAINE-PRILOCAINE 2.5-2.5 % EX CREA
1.0000 "application " | TOPICAL_CREAM | CUTANEOUS | Status: DC | PRN
  Filled 2014-11-02: qty 5

## 2014-11-02 MED ORDER — PENTAFLUOROPROP-TETRAFLUOROETH EX AERO: 1.0000 "application " | INHALATION_SPRAY | CUTANEOUS | Status: DC | PRN

## 2014-11-02 MED ORDER — SODIUM CHLORIDE 0.9 % IV SOLN: 100.0000 mL | INTRAVENOUS | Status: DC | PRN

## 2014-11-02 MED ORDER — HEPARIN SODIUM (PORCINE) 1000 UNIT/ML DIALYSIS
1000.0000 [IU] | INTRAMUSCULAR | Status: DC | PRN
  Filled 2014-11-02: qty 1

## 2014-11-02 MED ORDER — ALBUTEROL SULFATE (2.5 MG/3ML) 0.083% IN NEBU: 10.0000 mg | INHALATION_SOLUTION | Freq: Once | RESPIRATORY_TRACT | Status: DC

## 2014-11-02 MED ORDER — NEPRO/CARBSTEADY PO LIQD
237.0000 mL | ORAL | Status: DC | PRN
  Filled 2014-11-02: qty 237

## 2014-11-02 MED ORDER — NEPRO/CARBSTEADY PO LIQD: 237.0000 mL | ORAL | Status: DC | PRN

## 2014-11-02 MED ORDER — LIDOCAINE-PRILOCAINE 2.5-2.5 % EX CREA: 1.0000 "application " | TOPICAL_CREAM | CUTANEOUS | Status: DC | PRN

## 2014-11-02 MED ORDER — HEPARIN SODIUM (PORCINE) 1000 UNIT/ML DIALYSIS: 1000.0000 [IU] | INTRAMUSCULAR | Status: DC | PRN

## 2014-11-02 MED ORDER — DEXTROSE 10 % IV SOLN: Freq: Once | INTRAVENOUS | Status: DC

## 2014-11-02 MED ORDER — SODIUM CHLORIDE 0.9 % IV SOLN
1.0000 g | Freq: Once | INTRAVENOUS | Status: AC
  Administered 2014-11-02: 1 g via INTRAVENOUS
  Filled 2014-11-02 (×2): qty 10

## 2014-11-02 MED ORDER — HEPARIN SODIUM (PORCINE) 1000 UNIT/ML DIALYSIS
2000.0000 [IU] | Freq: Once | INTRAMUSCULAR | Status: DC
  Filled 2014-11-02: qty 2

## 2014-11-02 MED ORDER — ALTEPLASE 2 MG IJ SOLR
2.0000 mg | Freq: Once | INTRAMUSCULAR | Status: DC | PRN
  Filled 2014-11-02: qty 2

## 2014-11-02 MED ORDER — HEPARIN SODIUM (PORCINE) 1000 UNIT/ML DIALYSIS: 2000.0000 [IU] | Freq: Once | INTRAMUSCULAR | Status: DC

## 2014-11-02 MED ORDER — PENTAFLUOROPROP-TETRAFLUOROETH EX AERO
1.0000 | INHALATION_SPRAY | CUTANEOUS | Status: DC | PRN
Start: 2014-11-02 — End: 2014-11-02

## 2014-11-02 MED ORDER — ALTEPLASE 2 MG IJ SOLR: 2.0000 mg | Freq: Once | INTRAMUSCULAR | Status: DC | PRN

## 2014-11-02 MED ORDER — INSULIN ASPART 100 UNIT/ML IV SOLN: 10.0000 [IU] | Freq: Once | INTRAVENOUS | Status: DC

## 2014-11-02 MED ORDER — DARBEPOETIN ALFA 200 MCG/0.4ML IJ SOSY
PREFILLED_SYRINGE | INTRAMUSCULAR | Status: AC
  Administered 2014-11-02: 200 ug via INTRAVENOUS
  Filled 2014-11-02: qty 0.4

## 2014-11-02 MED ORDER — MIDODRINE HCL 5 MG PO TABS
ORAL_TABLET | ORAL | Status: AC
  Filled 2014-11-02: qty 2

## 2014-11-02 MED ORDER — DEXTROSE 50 % IV SOLN
1.0000 | Freq: Once | INTRAVENOUS | Status: DC
  Filled 2014-11-02: qty 50

## 2014-11-02 MED ORDER — DOXERCALCIFEROL 4 MCG/2ML IV SOLN
INTRAVENOUS | Status: AC
  Administered 2014-11-02: 3 ug via INTRAVENOUS
  Filled 2014-11-02: qty 2

## 2014-11-02 NOTE — Progress Notes (Addendum)
Medicare Important Message given? YES  Date Medicare IM given:  11/02/2014  Medicare IM given by: Lenox Ponds RN CCM Case Mgmt phone [phone]

## 2014-11-02 NOTE — Progress Notes (Signed)
ANTIBIOTIC CONSULT NOTE - FOLLOW UP  Pharmacy Consult for Vancomycin and Zosyn Indication: wound/graft infections  No Known Allergies  Patient Measurements: Height: 5\' 8"  (172.7 cm) Weight: 172 lb 2.9 oz (78.1 kg) IBW/kg (Calculated) : 68.4 Adjusted Body Weight:   Vital Signs: Temp: 97.4 F (36.3 C) (12/07 1248) Temp Source: Axillary (12/07 1248) BP: 116/51 mmHg (12/07 1248) Pulse Rate: 81 (12/07 1248) Intake/Output from previous day: 12/06 0701 - 12/07 0700 In: 370 [P.O.:120; I.V.:250] Out: -  Intake/Output from this shift: Total I/O In: -  Out: 250 [Other:250]  Labs:  Recent Labs  10/31/14 0404 11/01/14 0500 11/01/14 0725 11/02/14 0624  WBC 2.3* 2.3*  --  1.2*  HGB 9.6* 9.4* 9.5* 10.4*  PLT 83* 85*  --  87*  CREATININE 4.94* 6.89*  --  8.98*   Estimated Creatinine Clearance: 5.7 mL/min (by C-G formula based on Cr of 8.98). No results for input(s): VANCOTROUGH, VANCOPEAK, VANCORANDOM, GENTTROUGH, GENTPEAK, GENTRANDOM, TOBRATROUGH, TOBRAPEAK, TOBRARND, AMIKACINPEAK, AMIKACINTROU, AMIKACIN in the last 72 hours.   Microbiology: Recent Results (from the past 720 hour(s))  Culture, blood (routine x 2)     Status: None   Collection Time: 10/23/14  1:20 PM  Result Value Ref Range Status   Specimen Description BLOOD DRAWN BY DIALYSIS  Final   Special Requests BOTTLES DRAWN AEROBIC AND ANAEROBIC 10CC  Final   Culture  Setup Time   Final    10/23/2014 22:43 Performed at [facility]    Culture   Final    NO GROWTH 5 DAYS Performed at [facility]    Report Status 10/29/2014 FINAL  Final  Wound culture     Status: None   Collection Time: 10/23/14  3:10 PM  Result Value Ref Range Status   Specimen Description WOUND LEFT THIGH  Final   Special Requests PT RECEIVED VANC ON 11/23  Final   Gram Stain   Final    FEW WBC PRESENT,BOTH PMN AND MONONUCLEAR NO SQUAMOUS EPITHELIAL CELLS SEEN NO ORGANISMS SEEN Performed at [facility]    Culture   Final    FEW METHICILLIN RESISTANT STAPHYLOCOCCUS AUREUS Note: RIFAMPIN AND GENTAMICIN SHOULD NOT BE USED AS SINGLE DRUGS FOR TREATMENT OF STAPH INFECTIONS. This organism DOES NOT demonstrate inducible Clindamycin resistance in vitro. CRITICAL RESULT CALLED TO, READ BACK BY AND VERIFIED WITH: BRANDY D 11/30  @945  BY REAMM Performed at [facility]    Report Status 10/26/2014 FINAL  Final   Organism ID, Bacteria METHICILLIN RESISTANT STAPHYLOCOCCUS AUREUS  Final      Susceptibility   Methicillin resistant staphylococcus aureus - MIC*    CLINDAMYCIN <=0.25 SENSITIVE Sensitive     ERYTHROMYCIN >=8 RESISTANT Resistant     GENTAMICIN <=0.5 SENSITIVE Sensitive     LEVOFLOXACIN >=8 RESISTANT Resistant     OXACILLIN >=4 RESISTANT Resistant     PENICILLIN >=0.5 RESISTANT Resistant     RIFAMPIN <=0.5 SENSITIVE Sensitive     TRIMETH/SULFA <=10 SENSITIVE Sensitive     VANCOMYCIN 1 SENSITIVE Sensitive     TETRACYCLINE <=1 SENSITIVE Sensitive     * FEW METHICILLIN RESISTANT STAPHYLOCOCCUS AUREUS  Culture, blood (routine x 2)     Status: None   Collection Time: 10/23/14  3:10 PM  Result Value Ref Range Status   Specimen Description BLOOD  Final   Special Requests BOTTLES DRAWN AEROBIC AND ANAEROBIC 10CC AVG  Final   Culture  Setup Time   Final    10/23/2014 22:42 Performed  at [facility]    Culture   Final    NO GROWTH 5 DAYS Performed at [facility]    Report Status 10/29/2014 FINAL  Final  Culture, blood (routine x 2)     Status: None (Preliminary result)   Collection Time: 10/28/14 10:40 PM  Result Value Ref Range Status   Specimen Description BLOOD LEFT FOREARM  Final   Special Requests BOTTLES DRAWN AEROBIC AND ANAEROBIC 10CC  Final   Culture  Setup Time   Final    10/29/2014 04:29 Performed at [facility]    Culture   Final           BLOOD CULTURE RECEIVED NO GROWTH TO DATE CULTURE WILL BE HELD FOR 5 DAYS BEFORE ISSUING A FINAL NEGATIVE  REPORT Performed at [facility]    Report Status PENDING  Incomplete  Culture, blood (routine x 2)     Status: None (Preliminary result)   Collection Time: 10/28/14 10:45 PM  Result Value Ref Range Status   Specimen Description BLOOD LEFT WRIST  Final   Special Requests BOTTLES DRAWN AEROBIC AND ANAEROBIC 5CC  Final   Culture  Setup Time   Final    10/29/2014 04:29 Performed at [facility]    Culture   Final           BLOOD CULTURE RECEIVED NO GROWTH TO DATE CULTURE WILL BE HELD FOR 5 DAYS BEFORE ISSUING A FINAL NEGATIVE REPORT Performed at [facility]    Report Status PENDING  Incomplete  Anaerobic culture     Status: None (Preliminary result)   Collection Time: 11/01/14  8:37 AM  Result Value Ref Range Status   Specimen Description WOUND LEFT THIGH GRAFT  Final   Special Requests NONE  Final   Gram Stain PENDING  Incomplete   Culture   Final    NO ANAEROBES ISOLATED; CULTURE IN PROGRESS FOR 5 DAYS Performed at [facility]    Report Status PENDING  Incomplete  Wound culture     Status: None (Preliminary result)   Collection Time: 11/01/14  8:37 AM  Result Value Ref Range Status   Specimen Description WOUND LEFT THIGH GRAFT  Final   Special Requests NONE  Final   Gram Stain PENDING  Incomplete   Culture   Final    NO GROWTH 1 DAY Performed at [facility]    Report Status PENDING  Incomplete    Anti-infectives    Start     Dose/Rate Route Frequency Ordered Stop   10/30/14 1600  piperacillin-tazobactam (ZOSYN) IVPB 2.25 g     2.25 g100 mL/hr over 30 Minutes Intravenous Every 8 hours 10/30/14 1514     10/30/14 1200  vancomycin (VANCOCIN) 1,250 mg in sodium chloride 0.9 % 250 mL IVPB     1,250 mg250 mL/hr over 60 Minutes Intravenous Every M-W-F (Hemodialysis) 10/28/14 2119     10/28/14 2145  vancomycin (VANCOCIN) 1,250 mg in sodium chloride 0.9 % 250 mL IVPB     1,250 mg166.7 mL/hr over 90 Minutes Intravenous  Once 10/28/14  2119 10/29/14 0102   10/28/14 1300  doxycycline (VIBRA-TABS) tablet 100 mg  Status:  Discontinued     100 mg Oral Every 12 hours 10/28/14 1152 10/28/14 2102   10/26/14 1600  cefTAZidime (FORTAZ) 2 g in dextrose 5 % 50 mL IVPB  Status:  Discontinued     2 g100 mL/hr over 30 Minutes Intravenous Every M-W-F (Hemodialysis) 10/26/14  1326 10/27/14 1143   10/26/14 1200  vancomycin (VANCOCIN) 1,250 mg in sodium chloride 0.9 % 250 mL IVPB  Status:  Discontinued     1,250 mg250 mL/hr over 60 Minutes Intravenous Every M-W-F (Hemodialysis) 10/26/14 0855 10/28/14 1150   10/24/14 1100  cefTAZidime (FORTAZ) 2 g in dextrose 5 % 50 mL IVPB     2 g100 mL/hr over 30 Minutes Intravenous  Once 10/24/14 1047 10/24/14 1226   10/23/14 1630  cefTAZidime (FORTAZ) 2 g in dextrose 5 % 50 mL IVPB  Status:  Discontinued     2 g100 mL/hr over 30 Minutes Intravenous Every M-W-F (Hemodialysis) 10/23/14 1505 10/26/14 1124   10/23/14 1600  vancomycin (VANCOCIN) IVPB 1000 mg/200 mL premix  Status:  Discontinued     1,000 mg200 mL/hr over 60 Minutes Intravenous Every M-W-F (Hemodialysis) 10/23/14 1559 10/26/14 0855   10/23/14 1545  vancomycin (VANCOCIN) 1,750 mg in sodium chloride 0.9 % 250 mL IVPB  Status:  Discontinued     1,750 mg250 mL/hr over 60 Minutes Intravenous To Hemodialysis 10/23/14 1530 10/23/14 1540   10/23/14 1530  vancomycin (VANCOCIN) 1,750 mg in sodium chloride 0.9 % 500 mL IVPB  Status:  Discontinued     1,750 mg250 mL/hr over 120 Minutes Intravenous To Hemodialysis 10/23/14 1528 10/23/14 1530      Assessment: 88yom continues on Vancomycin and Zosyn for wound/HD graft infection. Patient had graft removed on 12/6. Patient has ESRD and receives HD on MWF. Per IM notes, possible transition to oral antibiotics tomorrow.  - Tmax 101.9 (afebrile x 18 hours) - WBC down to 1.2 (ANC 1000) - No new culture growth; New cultures collected 12/6  Goal of Therapy:  Pre-HD Vancomycin level 15-25 mcg/ml  Plan:  1.  Continue Vancomycin 1250 mg IV qHD (MWF) - will plan to recheck pre-HD level on 12/9 if continued 2. Continue Zosyn 2.25 grams iv Q 8 hours 3. Monitor renal plans, cultures, clinical course and adjust as indicated  [place], [place] 11/02/2014,2:07 PM

## 2014-11-02 NOTE — Progress Notes (Signed)
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 Presents with severe retrosternal chest pain and dyspnea with minimal exertion occurring over the last 3 days as well as graft infection of left groin. Pt had cardiac cath 12/2.  s/p removal of a left thigh infected dialysis graft 12/6.    PT Comments    Patient agreed to therapy with encouragement from his daughter but did not tolerate mobility well today.  SpO2 remained above 90% throughout session on room air.  Pt on 2L supplemental O2 at start of session, returned to 1L at end of session.  Educated on proper technique to stand but pt unable to achieve adequate anterior translation to stand without mod assist.  Will continue to follow to maximize independence, strength, and functional mobility.     Follow Up Recommendations  SNF;Supervision/Assistance - 24 hour     Equipment Recommendations  3in1 (PT)    Recommendations for Other Services       Precautions / Restrictions Precautions Precautions: Fall Restrictions Weight Bearing Restrictions: No    Mobility  Bed Mobility Overal bed mobility: Needs Assistance Bed Mobility: Supine to Sit     Supine to sit: Mod assist     General bed mobility comments: Pt required assistance moving legs off side of bed and elevating trunk to seated position, secondary to pain from surgery yesterday and generalized weakness.  Transfers Overall transfer level: Needs assistance Equipment used: Rolling walker (2 wheeled) Transfers: Sit to/from Stand Sit to Stand: Mod assist         General transfer comment: Max cues for hand placement, anterior translation, foot position, and body positioning to stand; required 2 trials; verbal cues to control descent to seated  position.  Ambulation/Gait Ambulation/Gait assistance: Mod assist Ambulation Distance (Feet): 20 Feet Assistive device: Rolling walker (2 wheeled) Gait Pattern/deviations: Step-through pattern;Decreased stride length;Antalgic;Trunk flexed;Shuffle   Gait velocity interpretation: Below normal speed for age/gender General Gait Details: Mod assist with anterior translation via P.T. hip support at patient sacrum becoming min assist by the end of ambulation.  Verbal cues for body positioning/posture, and RW use.     Stairs            Wheelchair Mobility    Modified Rankin (Stroke Patients Only)       Balance Overall balance assessment: Needs assistance Sitting-balance support: No upper extremity supported;Feet supported Sitting balance-Leahy Scale: Fair     Standing balance support: Bilateral upper extremity supported Standing balance-Leahy Scale: Poor                      Cognition Arousal/Alertness: Awake/alert Behavior During Therapy: WFL for tasks assessed/performed Overall Cognitive Status: Impaired/Different from baseline Area of Impairment: Problem solving;Safety/judgement         Safety/Judgement: Decreased awareness of safety   Problem Solving: Slow processing;Requires verbal cues      Exercises General Exercises - Lower Extremity Long Arc Quad: AROM;Seated;10 reps;Both Hip Flexion/Marching: AROM;Seated;Both;10 reps    General Comments        Pertinent Vitals/Pain Pain Assessment: Faces Faces Pain Scale: Hurts even more Pain Location: LLE Pain Intervention(s): Limited activity within patient's tolerance;Monitored during session;Repositioned  SpO2 seated at start of session: 93% (RA)     Home Living  Prior Function            PT Goals (current goals can now be found in the care plan section) Progress towards PT goals: Progressing toward goals    Frequency  Min 3X/week    PT Plan Current plan  remains appropriate    Co-evaluation             End of Session Equipment Utilized During Treatment: Gait belt Activity Tolerance: Patient limited by fatigue;Patient limited by pain Patient left: in chair;with family/visitor present;with call bell/phone within reach     Time: 0786-7544 PT Time Calculation (min) (ACUTE ONLY): 27 min  Charges:  $Gait Training: 8-22 mins $Therapeutic Exercise: 8-22 mins                    G Codes:      Jane Broughton SPT 11/02/2014, 2:59 PM

## 2014-11-02 NOTE — Procedures (Signed)
Assessment: 1. CP / hx CABG - cath showed patent grafts 2. Fevers / AVG drainage / +MRSA - to OR for AVG removal today 3. Hyperkalemia - persistent, be sure on renal diet after procedure today, low K bath w HD tomorrow 4. ESRD on HD - using medial limb of thigh AVG for dialysis now. Friday HD notes not in EPIC as system was down but pt did have HD Friday.  5. HTN/ volume - chronic hypotension on midodrine, down to dry wt 6. Anemia of CKD - prob ESA resistance due to MDS, s/p 2u prbc, cont max ESA 7. MBD - cont vit D, sensipar, renvela. PTH down to 308 in Oct, Ca/P ok. 3.5 Ca bath dec'd to 2.5 by Dr Lorrene Reid here 8. MDS - pancytopenia from Revlimid, on hold. Weekly Granix, Dr Alvy Bimler following 9. Hx CVA 10. Hx recurrent bladder cancer  Plan - HD in progress. Febrile event last PM with confusion. Hemodynamically stable.  Afebrile, green , BFR 300cc/min, using groin AVG without difficulty. K>7.7 this AM on low K bath.  WBC 1200 Andrew [name] C

## 2014-11-02 NOTE — Progress Notes (Signed)
  PROGRESS NOTE MEDICINE TEACHING ATTENDING   Day 10 of stay Patient name: Andrew [name]   Medical record number: [id number] Date of birth: 1926/06/11   I met with Mr Torbeck this morning while he was in HD. He appeared well and resting. No further complaints. Overnight breathing issue seems to have resolved, he is on 2L of oxygen but in no distress. Lower pressures overnight, now seem to have resolved. Hyperkalemia, 7.7, emergent HD, repeat BMP awaited. CT scan on 10/30/14 reported possible subcutaneous abscess close to the abandoned left thigh graft. The graft was removed by Dr Trula Slade on 11/01/14. The patient has been afebrile for more than 24 hours now. If the patient continues to be afebrile, we will de-escalate antibiotic coverage.   I have discussed the care of this patient with my IM team residents. Please see the resident note for details.  [place], [place] 11/02/2014, 1:45 PM.

## 2014-11-02 NOTE — Clinical Social Work Note (Signed)
Received email from patient's daughter whom said patient would like to go to [place] because he lives in [place].  [place] [facility] and left a message awaiting call back.  Jones Broom. [place], MSW, [place] 11/02/2014 5:04 PM

## 2014-11-02 NOTE — Progress Notes (Signed)
Postoperative day #1, status post incision and drainage with graft removal of infected left thigh graft.  No cultures have resulted yet Febrile overnight, with respiratory issues.  I performed dressing change today.  The wound is healing nicely.  He will continue with wet-to-dry dressing changes.  Andrew [name]

## 2014-11-02 NOTE — Progress Notes (Signed)
Pt. Not seen for skilled OT secondary to currently being in HD.  Will check back as able.  Romana Juniper, COTA/L

## 2014-11-02 NOTE — Progress Notes (Addendum)
Subjective:  Yesterday evening, patient has an episode of dyspnea and work of breathing though resolved now.. Please see Dr. Arsenio Katz note from last night. Patient is somnolent this morning but reporting no complaints.  Objective: Vital signs in last 24 hours: Filed Vitals:   11/02/14 0930 11/02/14 1000 11/02/14 1030 11/02/14 1100  BP: 76/47 76/49 95/49  74/44  Pulse: 87 85 98 87  Temp:      TempSrc:      Resp: 16 17 20 19   Height:      Weight:      SpO2:       Weight change:   Intake/Output Summary (Last 24 hours) at 11/02/14 1132 Last data filed at 11/01/14 1300  Gross per 24 hour  Intake    120 ml  Output      0 ml  Net    120 ml   Physical Exam  General: resting in bed, comfortable, NAD HEENT: Moist mucous membranes, hard of hearing Cardiac: 2/6 systolic murmur, no rubs or gallops Pulm: Bibasilar Rales Abd: soft, nondistended, BS present Ext: Left thigh status post surgery with dressing clean and intact Neuro: Alert and oriented  Lab Results: Basic Metabolic Panel:  Recent Labs Lab 10/26/14 1205  11/01/14 0500 11/01/14 0725 11/02/14 0624  NA 136*  < > 135* 132* 134*  K 5.3  < > 6.1* 5.9* >7.7*  CL 95*  < > 95*  --  91*  CO2 24  < > 24  --  22  GLUCOSE 114*  < > 74 80 169*  BUN 67*  < > 54*  --  76*  CREATININE 9.15*  < > 6.89*  --  8.98*  CALCIUM 7.5*  < > 7.5*  --  7.6*  PHOS 3.6  --   --   --   --   < > = values in this interval not displayed. Liver Function Tests:  Recent Labs Lab 10/26/14 1205  ALBUMIN 2.8*   CBC:  Recent Labs Lab 11/01/14 0500 11/01/14 0725 11/02/14 0624  WBC 2.3*  --  1.2*  NEUTROABS 1.0*  --  1.0*  HGB 9.4* 9.5* 10.4*  HCT 28.7* 28.0* 32.5*  MCV 104.7*  --  103.8*  PLT 85*  --  87*    Micro Results: Recent Results (from the past 240 hour(s))  Culture, blood (routine x 2)     Status: None   Collection Time: 10/23/14  1:20 PM  Result Value Ref Range Status   Specimen Description BLOOD DRAWN BY DIALYSIS   Final   Special Requests BOTTLES DRAWN AEROBIC AND ANAEROBIC 10CC  Final   Culture  Setup Time   Final    10/23/2014 22:43 Performed at [facility]    Culture   Final    NO GROWTH 5 DAYS Performed at [facility]    Report Status 10/29/2014 FINAL  Final  Wound culture     Status: None   Collection Time: 10/23/14  3:10 PM  Result Value Ref Range Status   Specimen Description WOUND LEFT THIGH  Final   Special Requests PT RECEIVED VANC ON 11/23  Final   Gram Stain   Final    FEW WBC PRESENT,BOTH PMN AND MONONUCLEAR NO SQUAMOUS EPITHELIAL CELLS SEEN NO ORGANISMS SEEN Performed at [facility]    Culture   Final    FEW METHICILLIN RESISTANT STAPHYLOCOCCUS AUREUS Note: RIFAMPIN AND GENTAMICIN SHOULD NOT BE USED AS SINGLE DRUGS FOR TREATMENT OF STAPH INFECTIONS.  This organism DOES NOT demonstrate inducible Clindamycin resistance in vitro. CRITICAL RESULT CALLED TO, READ BACK BY AND VERIFIED WITH: BRANDY D 11/30  @945  BY REAMM Performed at [facility]    Report Status 10/26/2014 FINAL  Final   Organism ID, Bacteria METHICILLIN RESISTANT STAPHYLOCOCCUS AUREUS  Final      Susceptibility   Methicillin resistant staphylococcus aureus - MIC*    CLINDAMYCIN <=0.25 SENSITIVE Sensitive     ERYTHROMYCIN >=8 RESISTANT Resistant     GENTAMICIN <=0.5 SENSITIVE Sensitive     LEVOFLOXACIN >=8 RESISTANT Resistant     OXACILLIN >=4 RESISTANT Resistant     PENICILLIN >=0.5 RESISTANT Resistant     RIFAMPIN <=0.5 SENSITIVE Sensitive     TRIMETH/SULFA <=10 SENSITIVE Sensitive     VANCOMYCIN 1 SENSITIVE Sensitive     TETRACYCLINE <=1 SENSITIVE Sensitive     * FEW METHICILLIN RESISTANT STAPHYLOCOCCUS AUREUS  Culture, blood (routine x 2)     Status: None   Collection Time: 10/23/14  3:10 PM  Result Value Ref Range Status   Specimen Description BLOOD  Final   Special Requests BOTTLES DRAWN AEROBIC AND ANAEROBIC 10CC AVG  Final   Culture  Setup Time   Final     10/23/2014 22:42 Performed at [facility]    Culture   Final    NO GROWTH 5 DAYS Performed at [facility]    Report Status 10/29/2014 FINAL  Final  Culture, blood (routine x 2)     Status: None (Preliminary result)   Collection Time: 10/28/14 10:40 PM  Result Value Ref Range Status   Specimen Description BLOOD LEFT FOREARM  Final   Special Requests BOTTLES DRAWN AEROBIC AND ANAEROBIC 10CC  Final   Culture  Setup Time   Final    10/29/2014 04:29 Performed at [facility]    Culture   Final           BLOOD CULTURE RECEIVED NO GROWTH TO DATE CULTURE WILL BE HELD FOR 5 DAYS BEFORE ISSUING A FINAL NEGATIVE REPORT Performed at [facility]    Report Status PENDING  Incomplete  Culture, blood (routine x 2)     Status: None (Preliminary result)   Collection Time: 10/28/14 10:45 PM  Result Value Ref Range Status   Specimen Description BLOOD LEFT WRIST  Final   Special Requests BOTTLES DRAWN AEROBIC AND ANAEROBIC 5CC  Final   Culture  Setup Time   Final    10/29/2014 04:29 Performed at [facility]    Culture   Final           BLOOD CULTURE RECEIVED NO GROWTH TO DATE CULTURE WILL BE HELD FOR 5 DAYS BEFORE ISSUING A FINAL NEGATIVE REPORT Performed at [facility]    Report Status PENDING  Incomplete  Wound culture     Status: None (Preliminary result)   Collection Time: 11/01/14  8:37 AM  Result Value Ref Range Status   Specimen Description WOUND LEFT THIGH GRAFT  Final   Special Requests NONE  Final   Gram Stain PENDING  Incomplete   Culture   Final    NO GROWTH 1 DAY Performed at [facility]    Report Status PENDING  Incomplete   Studies/Results: No results found.  Medications: I have reviewed the patient's current medications. Scheduled Meds: . albuterol  10 mg Nebulization Once  . allopurinol  100 mg Oral BID  . aspirin EC  81 mg Oral QHS  .  atorvastatin  10 mg Oral q1800  . calcium chloride  IV  1 g  Intravenous Once  . cinacalcet  30 mg Oral Q breakfast  . darbepoetin (ARANESP) injection - DIALYSIS  200 mcg Intravenous Q Mon-HD  . dextrose   Intravenous Once  . dextrose  1 ampule Intravenous Once  . doxercalciferol  3 mcg Intravenous Q M,W,F-HD  . feeding supplement (NEPRO CARB STEADY)  237 mL Oral BID BM  . gabapentin  100 mg Oral Daily  . gabapentin  200 mg Oral QHS  . heparin  4,000 Units Intravenous Once  . [START ON 11/03/2014] heparin  2,000 Units Dialysis Once in dialysis  . heparin  5,000 Units Subcutaneous 3 times per day  . insulin aspart  10 Units Intravenous Once  . midodrine  10 mg Oral TID WC  . piperacillin-tazobactam (ZOSYN)  IV  2.25 g Intravenous Q8H  . polyethylene glycol  17 g Oral QHS  . sevelamer carbonate  2,400 mg Oral TID WC  . sodium chloride  3 mL Intravenous Q12H  . vancomycin  1,250 mg Intravenous Q M,W,F-HD   Continuous Infusions:   PRN Meds:.sodium chloride, sodium chloride, sodium chloride, sodium chloride, acetaminophen, acetaminophen, albuterol, alteplase, alteplase, docusate sodium, feeding supplement (NEPRO CARB STEADY), feeding supplement (NEPRO CARB STEADY), heparin, heparin, heparin, HYDROmorphone (DILAUDID) injection, lidocaine (PF), lidocaine (PF), lidocaine-prilocaine, pentafluoroprop-tetrafluoroeth, promethazine, sevelamer carbonate, sodium chloride Assessment/Plan:  Patient is an 80 year old with end-stage renal disease on hemodialysis, myelodysplastic syndrome, combined systolic and diastolic congestive heart failure, hyperlipidemia, and peripheral neuropathy hospitalized for unstable angina status post cardiac catheterization without intervention and 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  MRSA graft site infection/fever: After spiking fevers for the last 3 days, patient was found to have a 11 mm suspected small abscess imaging and is now status post excision of defunctionalized limb of the dialysis graft vascular surgery.  Patient spiked a fever yesterday evening around episode of dyspnea and stridor thought to be secondary to laryngeal edema status post intubation. Unclear if this fever represents the same suspected infectious process from previous days or as a postoperative complication. Patient has been afebrile overnight. Patient otherwise hemodynamically stable. -Continue to monitor patient and consider transition from vancomycin and Zosyn to oral antibiotics tomorrow if stable.  Hyperkalemia: Potassium this morning is greater than 7.7, up from 5.9. EKG with more prominent T waves but preserved QRS. Likely secondary to surgery. -Calcium chloride -Patient due for dialysis today.  Suspected laryngeal edema: Patient with an episode of stridor yesterday along with desaturations and acute respiratory acidosis on arterial blood gas now status post Solu-Medrol 80 mg, Benadryl 25 mg, albuterol, non-breather mask, and epinephrine. Patient is much improved now on 2 L nasal cannula. -Continue to monitor.  Unstable angina: Patient status post cardiac catheterization with suspected small circumflex and diagonal branches as the etiology for symptoms. However, intervention was not able to be performed. Will continue with risk profile modification.   - Continue with aspirin 81 mg and Lipitor 10 mg  Neutropenia: ANC has been overall stable, 1000 today, stable from last several days. WBC of 1.2 down from 2.3 Could be reactive from surgery.   - Neutropenic precautions.  - Continue to monitor  ESRD: Patient receiving dialysis today. -Continue home Sevelamer, Hectorol, mitodrine, Nepro  MDS: Pancytopenia related to this condition, and treatment side effects of Renvelid. Hemoglobin is overall stable at 10.4 today.  - Continue to follow as an outpatient as this could also be an exacerbating  factor for the patient's dyspnea.  Combined systolic and diastolic CHF: EF 37%, grade 2 diastolic dysfunction, akinesis of the  basal-midinferolateral and inferior myocardium per echo (11/28). Wt 170, up from 169 on admission. Net -4.4 L since admission.  -Daily weights & strict I&Os as noted above -dialysis as above  Gout: Continue allopurinol 100mg .  HLD: Continue statin as noted above.  Peripheral neuropathy: Continue home gabapentin.  Presumed COPD: No PFTs on file though will continue home albuterol.  - Consider outpatient PFT  FEN:   -Diet: Heart healthy diet  DVT prophylaxis: Subcutaneous heparin  Resolved issues:  - Possible HCAP: was on a course of ceftazidime in addition to vancomycin  CODE STATUS: FULL CODE  Dispo: Disposition is deferred at this time, awaiting improvement of current medical problems.      The patient does have a current PCP Redmond School, MD) and does not need an [facility] hospital follow-up appointment after discharge.  The patient does have transportation limitations that hinder transportation to clinic appointments.   LOS: 10 days   Luan Moore, MD 11/02/2014, 11:32 AM

## 2014-11-02 NOTE — Progress Notes (Signed)
PT Cancellation Note  Patient Details Name: Andrew [name] MRN: [id number] DOB: June 17, 1926   Cancelled Treatment:    Reason Eval/Treat Not Completed: Patient at procedure or test/unavailable (Pt in HD and unavailable again for therapy. will attempt in pm as time allows)   Lanetta Inch [facility] 11/02/2014, 9:21 AM [name], [place]

## 2014-11-03 ENCOUNTER — Other Ambulatory Visit: Payer: Medicare Other

## 2014-11-03 ENCOUNTER — Ambulatory Visit: Payer: Medicare Other

## 2014-11-03 LAB — BASIC METABOLIC PANEL
Anion gap: 15 (ref 5–15)
BUN: 51 mg/dL — ABNORMAL HIGH (ref 6–23)
CO2: 27 meq/L (ref 19–32)
CREATININE: 5.25 mg/dL — AB (ref 0.50–1.35)
Calcium: 7.8 mg/dL — ABNORMAL LOW (ref 8.4–10.5)
Chloride: 96 mEq/L (ref 96–112)
GFR calc non Af Amer: 9 mL/min — ABNORMAL LOW (ref >90)
GFR, EST AFRICAN AMERICAN: 10 mL/min — AB (ref >90)
Glucose, Bld: 88 mg/dL (ref 70–99)
Potassium: 4.8 mEq/L (ref 3.7–5.3)
Sodium: 138 mEq/L (ref 137–147)

## 2014-11-03 LAB — CBC WITH DIFFERENTIAL/PLATELET
BASOS ABS: 0 10*3/uL (ref 0.0–0.1)
Basophils Relative: 0 % (ref 0–1)
EOS ABS: 0.1 10*3/uL (ref 0.0–0.7)
Eosinophils Relative: 4 % (ref 0–5)
HCT: 25.6 % — ABNORMAL LOW (ref 39.0–52.0)
Hemoglobin: 8.2 g/dL — ABNORMAL LOW (ref 13.0–17.0)
LYMPHS ABS: 0.8 10*3/uL (ref 0.7–4.0)
Lymphocytes Relative: 48 % — ABNORMAL HIGH (ref 12–46)
MCH: 32.8 pg (ref 26.0–34.0)
MCHC: 32 g/dL (ref 30.0–36.0)
MCV: 102.4 fL — AB (ref 78.0–100.0)
Monocytes Absolute: 0.1 10*3/uL (ref 0.1–1.0)
Monocytes Relative: 5 % (ref 3–12)
Neutro Abs: 0.8 10*3/uL — ABNORMAL LOW (ref 1.7–7.7)
Neutrophils Relative %: 43 % (ref 43–77)
PLATELETS: 87 10*3/uL — AB (ref 150–400)
RBC: 2.5 MIL/uL — ABNORMAL LOW (ref 4.22–5.81)
RDW: 18.5 % — AB (ref 11.5–15.5)
WBC: 1.8 10*3/uL — ABNORMAL LOW (ref 4.0–10.5)

## 2014-11-03 LAB — WOUND CULTURE: CULTURE: NO GROWTH

## 2014-11-03 LAB — PATHOLOGIST SMEAR REVIEW

## 2014-11-03 MED ORDER — CIPROFLOXACIN HCL 500 MG PO TABS
500.0000 mg | ORAL_TABLET | ORAL | Status: DC
  Administered 2014-11-03 – 2014-11-04 (×2): 500 mg via ORAL
  Filled 2014-11-03 (×2): qty 1

## 2014-11-03 MED ORDER — DOXYCYCLINE HYCLATE 100 MG PO TABS
100.0000 mg | ORAL_TABLET | Freq: Two times a day (BID) | ORAL | Status: DC
  Administered 2014-11-03 – 2014-11-04 (×3): 100 mg via ORAL
  Filled 2014-11-03 (×4): qty 1

## 2014-11-03 MED ORDER — CIPROFLOXACIN HCL 500 MG PO TABS: 500.0000 mg | ORAL_TABLET | Freq: Two times a day (BID) | ORAL | Status: DC

## 2014-11-03 NOTE — Plan of Care (Signed)
Problem: Phase I Progression Outcomes Goal: Distal pulses equal to baseline Outcome: Completed/Met Date Met:  11/03/14

## 2014-11-03 NOTE — Clinical Social Work Note (Addendum)
Spoke to patient's daughter Andrew [name], who asked on the status of [facility] bed offer.  Informed patient that CSW has contacted facility to find out if they can take him and awaiting a call back.  Received phone call from physician who said patient may be ready to discharge tomorrow.  Received phone call back from [place] center who said they can offer a bed for patient and will able to accept him tomorrow or Thursday depending on when he is discharged.  Informed daughter, Andrew [name] that in morning meeting tomorrow CSW will know if patient will be discharging in the afternoon or not.  Patient is supposed received dialyis tomorrow, asked nurse if request for morning dialysis can be done in case patient is ready for discharge in the afternoon.  Nurse said she will check to see if it can be done.  Andrew [name]. Bliss, MSW, [facility] 11/03/2014 4:39 PM

## 2014-11-03 NOTE — Progress Notes (Signed)
[facility]  Subjective  - Doing well, not as short as breath today.     Objective 122/48 84 98 F (36.7 C) (Oral) 19 96%  Intake/Output Summary (Last 24 hours) at 11/03/14 1155 Last data filed at 11/03/14 0015  Gross per 24 hour  Intake    540 ml  Output    250 ml  Net    290 ml   Right thigh dressing Clean and dry- dressing changed per nursing- clean wound bed no active bleeding no purulence Thigh soft without erythema   Assessmeincision and drainage with graft removal of infected left thigh graft.nt/Planning: POD #2  Plan to D/C to SNF soon per daughter    Laurence Slate [facility] 11/03/2014 11:55 AM --  Laboratory Lab Results:  Recent Labs  11/02/14 0624 11/03/14 0713  WBC 1.2* 1.8*  HGB 10.4* 8.2*  HCT 32.5* 25.6*  PLT 87* 87*   BMET  Recent Labs  11/02/14 2142 11/03/14 0713  NA 139 138  K 4.7 4.8  CL 97 96  CO2 28 27  GLUCOSE 119* 88  BUN 38* 51*  CREATININE 4.61* 5.25*  CALCIUM 8.1* 7.8*    COAG Lab Results  Component Value Date   INR 1.10 11/01/2014   INR 1.22 10/26/2014   INR 1.25 06/27/2013   No results found for: PTT

## 2014-11-03 NOTE — Progress Notes (Addendum)
Subjective:  Patient states that he is doing well this morning, completing a crossword puzzle before starting our interview this morning. Patient does not report any complaints. Patient stating that he is breathing well.  Objective: Vital signs in last 24 hours: Filed Vitals:   11/02/14 1439 11/02/14 2003 11/03/14 0525 11/03/14 1047  BP:  120/53 122/48   Pulse:  83 78 84  Temp:  97.7 F (36.5 C) 98 F (36.7 C)   TempSrc:  Oral Oral   Resp:  18 18 19   Height:      Weight:      SpO2: 93% 96% 96%    Weight change:   Intake/Output Summary (Last 24 hours) at 11/03/14 1111 Last data filed at 11/03/14 0015  Gross per 24 hour  Intake    540 ml  Output    250 ml  Net    290 ml   Physical Exam  General: resting in bed, comfortable, NAD HEENT: Moist mucous membranes, hard of hearing Cardiac: 2/6 systolic murmur, no rubs or gallops Pulm: Clear to auscultation bilaterally, no wheezes rales or rhonchi Abd: soft, nondistended, BS present Ext: Left thigh status post surgery with dressing clean and intact, packing in place with no significant sanguinous or purulent drainage Neuro: Alert and oriented  Lab Results: Basic Metabolic Panel:  Recent Labs Lab 11/02/14 2142 11/03/14 0713  NA 139 138  K 4.7 4.8  CL 97 96  CO2 28 27  GLUCOSE 119* 88  BUN 38* 51*  CREATININE 4.61* 5.25*  CALCIUM 8.1* 7.8*   Liver Function Tests: No results for input(s): AST, ALT, ALKPHOS, BILITOT, PROT, ALBUMIN in the last 168 hours. CBC:  Recent Labs Lab 11/02/14 0624 11/03/14 0713  WBC 1.2* 1.8*  NEUTROABS 1.0* 0.8*  HGB 10.4* 8.2*  HCT 32.5* 25.6*  MCV 103.8* 102.4*  PLT 87* 87*    Micro Results: Recent Results (from the past 240 hour(s))  Culture, blood (routine x 2)     Status: None (Preliminary result)   Collection Time: 10/28/14 10:40 PM  Result Value Ref Range Status   Specimen Description BLOOD LEFT FOREARM  Final   Special Requests BOTTLES DRAWN AEROBIC AND ANAEROBIC 10CC   Final   Culture  Setup Time   Final    10/29/2014 04:29 Performed at [facility]    Culture   Final           BLOOD CULTURE RECEIVED NO GROWTH TO DATE CULTURE WILL BE HELD FOR 5 DAYS BEFORE ISSUING A FINAL NEGATIVE REPORT Performed at [facility]    Report Status PENDING  Incomplete  Culture, blood (routine x 2)     Status: None (Preliminary result)   Collection Time: 10/28/14 10:45 PM  Result Value Ref Range Status   Specimen Description BLOOD LEFT WRIST  Final   Special Requests BOTTLES DRAWN AEROBIC AND ANAEROBIC 5CC  Final   Culture  Setup Time   Final    10/29/2014 04:29 Performed at [facility]    Culture   Final           BLOOD CULTURE RECEIVED NO GROWTH TO DATE CULTURE WILL BE HELD FOR 5 DAYS BEFORE ISSUING A FINAL NEGATIVE REPORT Performed at [facility]    Report Status PENDING  Incomplete  Anaerobic culture     Status: None (Preliminary result)   Collection Time: 11/01/14  8:37 AM  Result Value Ref Range Status   Specimen Description WOUND LEFT THIGH GRAFT  Final   Special Requests NONE  Final   Gram Stain PENDING  Incomplete   Culture   Final    NO ANAEROBES ISOLATED; CULTURE IN PROGRESS FOR 5 DAYS Performed at [facility]    Report Status PENDING  Incomplete  Wound culture     Status: None   Collection Time: 11/01/14  8:37 AM  Result Value Ref Range Status   Specimen Description WOUND LEFT THIGH GRAFT  Final   Special Requests NONE  Final   Gram Stain   Final    RARE WBC PRESENT, PREDOMINANTLY PMN NO SQUAMOUS EPITHELIAL CELLS SEEN NO ORGANISMS SEEN Performed at [facility]    Culture   Final    NO GROWTH 2 DAYS Performed at [facility]    Report Status 11/03/2014 FINAL  Final  Culture, blood (routine x 2)     Status: None (Preliminary result)   Collection Time: 11/01/14 11:20 PM  Result Value Ref Range Status   Specimen Description BLOOD RIGHT FOREARM  Final   Special Requests BOTTLES  DRAWN AEROBIC AND ANAEROBIC 10CC EA  Final   Culture  Setup Time   Final    11/02/2014 09:32 Performed at [facility]    Culture   Final           BLOOD CULTURE RECEIVED NO GROWTH TO DATE CULTURE WILL BE HELD FOR 5 DAYS BEFORE ISSUING A FINAL NEGATIVE REPORT Performed at [facility]    Report Status PENDING  Incomplete  Culture, blood (routine x 2)     Status: None (Preliminary result)   Collection Time: 11/01/14 11:27 PM  Result Value Ref Range Status   Specimen Description BLOOD RIGHT HAND  Final   Special Requests   Final    BOTTLES DRAWN AEROBIC AND ANAEROBIC 10CC EA 5CC RED   Culture  Setup Time   Final    11/02/2014 09:32 Performed at [facility]    Culture   Final           BLOOD CULTURE RECEIVED NO GROWTH TO DATE CULTURE WILL BE HELD FOR 5 DAYS BEFORE ISSUING A FINAL NEGATIVE REPORT Performed at [facility]    Report Status PENDING  Incomplete   Studies/Results: No results found.  Medications: I have reviewed the patient's current medications. Scheduled Meds: . albuterol  10 mg Nebulization Once  . allopurinol  100 mg Oral BID  . aspirin EC  81 mg Oral QHS  . atorvastatin  10 mg Oral q1800  . cinacalcet  30 mg Oral Q breakfast  . ciprofloxacin  500 mg Oral Q24H  . darbepoetin (ARANESP) injection - DIALYSIS  200 mcg Intravenous Q Mon-HD  . dextrose   Intravenous Once  . dextrose  1 ampule Intravenous Once  . doxercalciferol  3 mcg Intravenous Q M,W,F-HD  . doxycycline  100 mg Oral Q12H  . feeding supplement (NEPRO CARB STEADY)  237 mL Oral BID BM  . gabapentin  100 mg Oral Daily  . gabapentin  200 mg Oral QHS  . heparin  5,000 Units Subcutaneous 3 times per day  . insulin aspart  10 Units Intravenous Once  . midodrine  10 mg Oral TID WC  . polyethylene glycol  17 g Oral QHS  . sevelamer carbonate  2,400 mg Oral TID WC  . sodium chloride  3 mL Intravenous Q12H   Continuous Infusions:   PRN Meds:.sodium chloride, sodium  chloride, sodium chloride, acetaminophen, acetaminophen, albuterol, docusate  sodium, feeding supplement (NEPRO CARB STEADY), heparin, heparin, HYDROmorphone (DILAUDID) injection, lidocaine (PF), lidocaine-prilocaine, pentafluoroprop-tetrafluoroeth, promethazine, sevelamer carbonate, sodium chloride Assessment/Plan:  Patient is an 80 year old with end-stage renal disease on hemodialysis, myelodysplastic syndrome, combined systolic and diastolic congestive heart failure, hyperlipidemia, and peripheral neuropathy hospitalized for unstable angina status post cardiac catheterization without intervention and MRSA graft site infection defunctionalized graft limb excision.  MRSA graft site infection/fever: After spiking fevers for the last 3 days, patient was found to have a 11 mm suspected small abscess imaging and is now status post excision of defunctionalized limb of the dialysis graft vascular surgery. Patient has remained afebrile in the last 24 hours. -Transition patient from vancomycin and Zosyn to doxycycline and ciprofloxacin pending Gram stain and culture results. Will narrow as appropriate. -We'll plan for a overall 7 day course of antibiotics starting from the date of surgery (11/01/2014)  Unstable angina/coronary artery disease: Patient status post cardiac catheterization with suspected small circumflex and diagonal branches as the etiology for symptoms. However, intervention was not able to be performed. Will continue with risk profile modification.   - Continue with aspirin 81 mg and Lipitor 10 mg  Neutropenia: Patient continues to be stably neutropenic. Likely secondary to myelodysplastic syndrome (see below).  - Neutropenic precautions.  - Continue to monitor  ESRD: Patient due for dialysis tomorrow. -Continue home Sevelamer, Hectorol, mitodrine, Nepro  MDS: Pancytopenia related to this condition, and treatment side effects of Renvelid. Hemoglobin is overall stable at 10.4 today.  -  Continue to follow as an outpatient as this could also be an exacerbating factor for the patient's dyspnea.  Combined systolic and diastolic CHF: EF 22%, grade 2 diastolic dysfunction, akinesis of the basal-midinferolateral and inferior myocardium per echo (11/28). Weight is up to 172 from 169 on admission. Net output during hospitalization is 4.1 L out with that +219 mL over the last 24 hours. -Daily weights & strict I&Os as noted above -dialysis as above  Gout: Continue allopurinol 100mg .  HLD: Continue statin as noted above.  Peripheral neuropathy: Continue home gabapentin.  Presumed COPD: No PFTs on file though will continue home albuterol.  - Consider outpatient PFT  FEN:   -Diet: Heart healthy diet  DVT prophylaxis: Subcutaneous heparin  Resolved issues:  - Possible HCAP: was on a course of ceftazidime in addition to vancomycin\  - Hyperkalemia  - suspected laryngeal edema  CODE STATUS: FULL CODE  Dispo: Potentially tomorrow if patient continues to exhibit clinical stability.  The patient does have a current PCP Redmond School, MD) and does not need an [facility] hospital follow-up appointment after discharge.  The patient does have transportation limitations that hinder transportation to clinic appointments.   LOS: 11 days   Luan Moore, MD 11/03/2014, 11:11 AM

## 2014-11-03 NOTE — Discharge Summary (Signed)
Name: Andrew [name] MRN: [id number] DOB: Nov 05, 1926 80 y.o. PCP: Redmond School, MD  Date of Admission: 10/23/2014 11:02 AM Date of Discharge: 11/03/2014 Attending Physician: Madilyn Fireman, MD  Discharge Diagnosis: Active Problems:   CAD (coronary artery disease)   Unstable angina   Aortic stenosis   Vascular device, implant, or graft infection or inflammation   SOB (shortness of breath)   ESRD on dialysis   Hyperkalemia  Discharge Medications:   Medication List    ASK your doctor about these medications        acetaminophen 500 MG tablet  Commonly known as:  TYLENOL  Take 500 mg by mouth every 6 (six) hours as needed. For pain/headache     albuterol 108 (90 BASE) MCG/ACT inhaler  Commonly known as:  PROVENTIL HFA;VENTOLIN HFA  Inhale 2 puffs into the lungs every 6 (six) hours as needed for wheezing or shortness of breath.     allopurinol 100 MG tablet  Commonly known as:  ZYLOPRIM  Take 100 mg by mouth 2 (two) times daily.     aspirin EC 81 MG tablet  Take 81 mg by mouth at bedtime.     atorvastatin 10 MG tablet  Commonly known as:  LIPITOR  Take 10 mg by mouth every morning.     calcium carbonate 500 MG chewable tablet  Commonly known as:  TUMS - dosed in mg elemental calcium  Chew 1 tablet by mouth daily as needed for heartburn. For heartburn     cinacalcet 30 MG tablet  Commonly known as:  SENSIPAR  Take 30 mg by mouth daily with breakfast.     colchicine 0.6 MG tablet  Take 0.6 mg by mouth 2 (two) times daily as needed (for gout).     DIALYVITE TABLET Tabs  Take 1 tablet by mouth at bedtime.     docusate sodium 100 MG capsule  Commonly known as:  COLACE  Take 100 mg by mouth daily as needed for mild constipation.     econazole nitrate 1 % cream  Apply 1 application topically daily as needed (Athletes foot).     gabapentin 100 MG capsule  Commonly known as:  NEURONTIN  Take 100-200 mg by mouth 2 (two) times daily. Takes 1 in the morning and 2  at night     GOLD BOND MAXIMUM RELIEF EX  Apply 1 application topically daily as needed (Itching).     GRANIX 480 MCG/0.8ML Sosy injection  Generic drug:  Tbo-Filgrastim  Inject 480 mcg into the skin once a week. Tuesdays     lubriderm seriously sensitive Lotn  Apply 1 application topically daily.     midodrine 10 MG tablet  Commonly known as:  PROAMATINE  Take 10-20 mg by mouth See admin instructions. On Dialysis days (M,W,F), patient takes 2 tabs in the morning and 1 in the evening and on the other days patient takes 1 tablet in the morning and 2 in teh evening     MIRALAX powder  Generic drug:  polyethylene glycol powder  Take 17 g by mouth at bedtime.     oxyCODONE-acetaminophen 5-325 MG per tablet  Commonly known as:  PERCOCET/ROXICET  Take 1 tablet by mouth every 6 (six) hours as needed.     REVLIMID 5 MG capsule  Generic drug:  lenalidomide  Take 5 mg by mouth See admin instructions. When neutrophils are above 1. Takes once weekly prn     sevelamer carbonate 800 MG tablet  Commonly known  as:  RENVELA  Take 1,600-2,400 mg by mouth See admin instructions. Take 3 tabs with each meals and 2 tabs with snacks        Disposition and follow-up:   [name] was discharged from [facility] in Stable condition.  At the hospital follow up visit please address:  1.  Please follow up the progress of wound healing of left thigh graft site, follow up with vascular surgery. Patient to follow up with Dr. Alvy Bimler for MDS.   2.  Labs / imaging needed at time of follow-up: none  3.  Pending labs/ test needing follow-up: none  Follow-up Appointments:     Follow-up Information    Follow up with Jory Sims, NP On 11/16/2014.   Specialty:  Nurse Practitioner   Why:  at 1:50 pm with the [facility] office. the NP there.   Contact information:   [address] [place] [address] [phone]       Discharge Instructions:   Consultations:  Treatment Team:  Jamal Maes, MD Serafina Mitchell, MD  Procedures Performed:  Dg Chest 2 View  10/29/2014   CLINICAL DATA:  Fever 10/24/2014  EXAM: CHEST  2 VIEW  COMPARISON:  None.  FINDINGS: Interval improvement in aeration bilaterally. Resolution of previously noted infiltrate or edema on the right. Vascularity has improved and is now normal. Lungs are clear and there is no effusion. Right axillary stent with kinking unchanged.  IMPRESSION: Improved aeration bilaterally which may be due to clearing edema or pneumonia. Lungs are now clear.   Electronically Signed   By: Franchot Gallo M.D.   On: 10/29/2014 09:04   [place] Extrem Low Left Ltd  10/29/2014   CLINICAL DATA:  Patient with history of revised left thigh dialysis graft, now with focal area of erythema. Evaluate for abscess.  EXAM: ULTRASOUND LEFT LOWER EXTREMITY LIMITED  TECHNIQUE: Ultrasound examination of the lower extremity soft tissues was performed in the area of clinical concern.  COMPARISON:  None  FINDINGS: Multiple grayscale and color flow images were provided of the patient's left upper thigh at the area of concern. Additionally, real-time sonographic evaluation was performed by the dictating radiologist.  Acquired cinegraphic images demonstrate that the focal area of erythema within the patient's upper medial thigh corresponds with focal dermal erosion superficial to the abandoned medial thigh dialysis graft. There is no associated organized fluid collection or abscess. This focal area of erythema is remote from the patient's functioning left thigh dialysis graft.  The lateral aspect of the patient's functioning left upper thigh dialysis graft appears widely patent where imaged.  IMPRESSION: 1. Focal area of erythema involving the left thigh corresponds to a focal area of dermal erosion superficial to the patient's abandoned left thigh dialysis graft. No associated organized fluid collection or abscess. 2. The lateral aspect of the  patient's functioning left thigh graft appears widely patent were imaged.   Electronically Signed   By: Sandi Mariscal M.D.   On: 10/29/2014 16:25   Dg Chest Port 1 View  10/24/2014   CLINICAL DATA:  Shortness of breath  EXAM: PORTABLE CHEST - 1 VIEW  COMPARISON:  10/23/2014  FINDINGS: Mild patchy opacity in the right upper and lower lobes, worrisome for multifocal pneumonia, less likely asymmetric interstitial edema. No pleural effusion or pneumothorax.  Cardiomegaly.  Postsurgical changes related to prior CABG.  Stable right axillary stent.  IMPRESSION: Mild patchy opacity in the right upper and lower lobes, worrisome for multifocal pneumonia,  less likely asymmetric interstitial edema.   Electronically Signed   By: Julian Hy M.D.   On: 10/24/2014 13:50   Dg Chest Port 1 View  10/23/2014   CLINICAL DATA:  80 year old with shortness of breath and cough  EXAM: PORTABLE CHEST - 1 VIEW  COMPARISON:  04/21/2014  FINDINGS: Median sternotomy wires, surgical clips and CABG markers are present. Metallic clips project in the left axilla. A vascular stent projects over the right shoulder.  The cardiac silhouette is enlarged. The mediastinal contours are within normal limits. Atherosclerotic aortic calcifications are present.  Interstitial prominence is present in the lung bases, right slightly greater than left. There is no pleural effusion on the right. The left costophrenic angle is excluded from the field of view, limiting evaluation. There is no pneumothorax.  No acute osseous abnormality is identified.  IMPRESSION: 1. Mild cardiomegaly with probable mild interstitial pulmonary edema. Interstitial pneumonitis could have a similar appearance. Clinical correlation is recommended. 2. Prior CABG.   Electronically Signed   By: Rosemarie Ax   On: 10/23/2014 12:09   Ct Extrem Lower W Cm Bil  10/30/2014   CLINICAL DATA:  He had short segment of nonfunctional left thigh graft removed on 10/01/2014. The area  where the short segment of nonfunctional graft has had some drainage which was positive for MRSA. Up till now patient has been afebrile. Apparently had one temp to 101.3-axillary last p.m.On exam today the area where the segment of graft was removed is not inflamed and no evidence of proximal or distal cellulitis. However was able to compress the proximal segment of nonfunctional graft and had some purulent drainage.  EXAM: CT OF THE LOWER BILATERAL EXTREMITY WITH CONTRAST  TECHNIQUE: Multidetector CT imaging of the was performed according to the standard protocol following intravenous contrast administration.  COMPARISON:  None.  CONTRAST:  189mL OMNIPAQUE IOHEXOL 300 MG/ML  SOLN  FINDINGS: No acute fracture or dislocation. No lytic or blastic osseous lesion. No bone destruction. Tricompartmental osteoarthritis of the knees bilaterally. Large left knee joint effusion.  There is diverticulosis without evidence of diverticulitis. There is atherosclerotic disease involving bilateral common iliac arteries and external iliac arteries.  There is a left femoral dialysis graft which is patent. There is hyperdense fragments of a abandoned fistula graft in the low left anterior thigh adjacent to the current non occluded graft. There is a small 11 mm hypodensity associated with the midportion of the abandoned left thigh graft just deep to the cutaneous surface which may represent a very small abscess versus low-density granulation material within the lumen. There is edema within the anterolateral aspect of the thigh within the subcutaneous fat. There is no focal drainable fluid collection.  There is an occluded right thigh AV fistula graft. There is peripheral vascular atherosclerotic disease.  IMPRESSION: 1. Patent left femoral AV fistula graft. Adjacently there is hyperdense fragments from an abandoned left anterior thigh AV fistula with a small 11 mm hypodensity associated with the midportion of the left thigh graft just  deep to the dermis which may reflect a small abscess versus low-density material within the lumen. There is no focal fluid collection around be functioning left femoral AV fistula graft. There is no large drainable fluid collection. There is soft tissue edema in the anterolateral aspect of the left thigh which may be reactive versus secondary to mild cellulitis. 2. Tricompartmental osteoarthritis of bilateral knees. Large left knee joint effusion.   Electronically Signed   By: Kathreen Devoid  On: 10/30/2014 17:01    2D Echo:   - Left ventricle: The cavity size was at the upper limits of normal. Wall thickness was normal. The estimated ejection fraction was 45%. There is akinesis of the basal-midinferolateral and inferior myocardium. Features are consistent with a pseudonormal left ventricular filling pattern, with concomitant abnormal relaxation and increased filling pressure (grade 2 diastolic dysfunction). - Aortic valve: Cusp separation was moderately reduced. There was moderate stenosis. Valve area (VTI): 1.15 cm^2. Valve area (Vmax): 1.1 cm^2. Valve area (Vmean): 1.14 cm^2. - Left atrium: The atrium was mildly dilated. - Right atrium: The atrium was mildly dilated.  Cardiac Cath:   Hemodynamics:   Central Aortic Pressure / Mean: Opening pressure is 157/63/69; final pressures were 102/39/62 after 20 mg IV labetalol mmHg  Left Ventricular Pressure / LVEDP: After labetalol 115/1/7 mmHg - mean gradient roughly 10 mmHg  Left Ventriculography:  EF: 40 - 45 %  Wall Motion: Basal to mid inferolateral hypokinesis/akinesis as well as distal anterior apical hypokinesis.  Coronary Anatomy:  Dominance: Right  Left Main: Moderate large-caliber vessel with 99% distal occlusion prior to the bifurcation of the LAD and Circumflex. The beginnings of both the LAD and circumflex arise from the same common trunk beyond that stenosis.  LAD: The residual LAD is a small moderate  caliber vessel that gives rise to a small proximal diagonal branch and septal perforator before terminating. First diagonal branches small caliber and diffusely diseased. Bifurcates proximally.   Left Circumflex: The only portion of the native circumflex fills is the proximal and AV groove portion. The stumps of the marginal branches are not noted.   RCA: 100% proximal occlusion  Graft Anatomy:  LIMA-LAD: Widely patent graft to the mid LAD. Retrograde filling fills a proximal diagonal branch. Low reveals the LAD with mild diffuse luminal irregularities distally. It wraps the apex briefly. Minimal luminal irregularities downstream in the native LAD.  SVG-OM 2-OM 3: Widely patent, large graft that has a proximal anastomosis to a large caliber proximal OM1 branch. There is in a widely patent continuous leg to a smaller distal OM branch.  OM 2 is a large caliber vessel that reaches almost out of the inferoapex. Angiographically normal.  OM 3 is a small caliber diffusely diseased vessel but none greater than 20-30%.  SVG-RPL: Widely patent graft to the proximal right posterior AV groove branch with antegrade flow filling a posterior lateral system with 2 posterior lateral branches. Retrograde flow fills back the native RCA with a RV marginal. There is also been flow down the PDA which reaches down to the apex. Diffuse luminal irregularities throughout the RCA system.  Admission HPI:   Mr. Mccort is an 80 year old with ESRD (M/W/F), CAD s/p CABG 1998, myelodysplastic syndrome, combined systolic and diastolic CHF (EF 76-19%, Aug 2014) who presents with chest pain. History was collected from him while he was in dialysis.   For the past 3 days, he reports having a chest pain associated with exertion which is similar to the one he had back in 1998 that warranted his CABG. Pain is accompanied by shortness of breath and only improves with rest. His pain has worsened to the point that he could not take  more than five steps which is what prompted him to come to the ED. He takes ASA 81mg  every day but denies any associated diaphoresis, cough, fever, nausea, vomiting, abdominal pain, sick contacts. He has a 25-pack year smoking history though denies any alcohol use.   He  gets dialysis M/W/F and reports blood coming out of his L thigh AV fistula that he has to "put on his gloves and clean" every other day but denies any pain at the site. Per chart review, he has a history of recurrent graft site infections though I am unable to find the culture data itself. Per Renal, most recent cultures (11/13) at MRSA/E. Faecalis, Bacteroides, and Veillonella.   In the ED, Nephrology was consulted and removed some purulent material from the non-functional portion of the thigh AV graft and started him on vancomycin and Fortaz. Cardiology was consulted and recommended heparin until ACS r/o.   Hospital Course by problem list: Active Problems:   CAD (coronary artery disease)   Unstable angina   Aortic stenosis   Vascular device, implant, or graft infection or inflammation   SOB (shortness of breath)   ESRD on dialysis   Hyperkalemia   MRSA graft site infection/fever: Patient had a left thigh graft nonfunctional segment removed on 10/01/2014. Patient initially presented with purulent drainage from his left thigh AV graft segment. Surgical exploration was deferred pending a full cardiac work up for unstable angina (see below). Patient was initiated on vancomycin and ceftazidime, which was eventually D escalated to only vancomycin and then briefly transitioned to doxycycline. On 10/30/2014, patient found to have continued drainage from nonfunctional graft though with no surrounding erythema or tenderness. CT scan of the lower extremity on the same day revealed a 11 mm focus of suspected abscess. Over the next several days, patient continued to spike fevers almost daily. Patient was converted to vancomycin and Zosyn.  Patient was taken to the operating room on 11/01/2014 for incision and drainage along with graft removal of the nonfunctional limb. Afterwards, patient has largely remained afebrile with transitioned to oral doxycycline and ciprofloxacin 11/03/2014. Patient is planned for a total 7 day course of antibiotics starting from the date of surgery (11/01/2014), which ends 11/08/2014.  Unstable angina/coronary artery disease: Patient's initial presentation consistent with prior episodes of ACS. Patient status post cardiac catheterization on 10/27/2014 with suspected small circumflex and diagonal branches as etiology of symptoms (please see catheterization report above). However, intervention was not able to be performed. Echocardiogram was remarkable for akinesis of the basal-mid inferolateral and inferior myocardium. Patient was continued with risk profile modification with aspirin 81 mg and Lipitor 10 mg (given patient's age).  ESRD: Patient received dialysis on a Monday Wednesday Friday schedule during his hospitalization. Patient was continued on his home Sevelamer, Hectorol, mitodrine, Nepro.  MDS: Pancytopenia related to this condition. Patient had a brief trial on Revlimid with dramatic improvement of blood counts and transfusion independence. However, patient's counts deteriorated over time and is receiving Aranesp by periodic injection under the care of Dr. Alvy Bimler. Patient was briefly resumed on Revlimid but had problems of exaggerated myelosuppression and was decreased to a dose of 2.5 mg once a week only if absolute neutrophil count over 1000. Patient's Revlimid was discontinued during his hospitalization.  Combined systolic and diastolic CHF: EF 83%, grade 2 diastolic dysfunction, akinesis of the basal-midinferolateral and inferior myocardium per echo (11/28). Weight is up to 172 from 169 on admission. Patient's net output was around 4 L out during his hospitalization.   Gout: Patient was continued on  home allopurinol 100 mg.   HLD: Patient was continued on statin as above.  Peripheral neuropathy: Continued home gabapentin.  Presumed COPD: No PFTs on file though will continue home albuterol. Consider outpatient PFTs.  Discharge Vitals:  BP 132/50 mmHg  Pulse 73  Temp(Src) 97.5 F (36.4 C) (Oral)  Resp 16  Ht 5\' 8"  (1.727 m)  Wt 172 lb 2.9 oz (78.1 kg)  BMI 26.19 kg/m2  SpO2 98%  Discharge Labs:  Results for orders placed or performed during the hospital encounter of 10/23/14 (from the past 24 hour(s))  Basic metabolic panel     Status: Abnormal   Collection Time: 11/02/14  9:42 PM  Result Value Ref Range   Sodium 139 137 - 147 mEq/L   Potassium 4.7 3.7 - 5.3 mEq/L   Chloride 97 96 - 112 mEq/L   CO2 28 19 - 32 mEq/L   Glucose, Bld 119 (H) 70 - 99 mg/dL   BUN 38 (H) 6 - 23 mg/dL   Creatinine, Ser 4.61 (H) 0.50 - 1.35 mg/dL   Calcium 8.1 (L) 8.4 - 10.5 mg/dL   GFR calc non Af Amer 10 (L) >90 mL/min   GFR calc Af Amer 12 (L) >90 mL/min   Anion gap 14 5 - 15  CBC with Differential     Status: Abnormal   Collection Time: 11/03/14  7:13 AM  Result Value Ref Range   WBC 1.8 (L) 4.0 - 10.5 K/uL   RBC 2.50 (L) 4.22 - 5.81 MIL/uL   Hemoglobin 8.2 (L) 13.0 - 17.0 g/dL   HCT 25.6 (L) 39.0 - 52.0 %   MCV 102.4 (H) 78.0 - 100.0 fL   MCH 32.8 26.0 - 34.0 pg   MCHC 32.0 30.0 - 36.0 g/dL   RDW 18.5 (H) 11.5 - 15.5 %   Platelets 87 (L) 150 - 400 K/uL   Neutrophils Relative % 43 43 - 77 %   Lymphocytes Relative 48 (H) 12 - 46 %   Monocytes Relative 5 3 - 12 %   Eosinophils Relative 4 0 - 5 %   Basophils Relative 0 0 - 1 %   Neutro Abs 0.8 (L) 1.7 - 7.7 K/uL   Lymphs Abs 0.8 0.7 - 4.0 K/uL   Monocytes Absolute 0.1 0.1 - 1.0 K/uL   Eosinophils Absolute 0.1 0.0 - 0.7 K/uL   Basophils Absolute 0.0 0.0 - 0.1 K/uL   RBC Morphology ELLIPTOCYTES   Basic metabolic panel     Status: Abnormal   Collection Time: 11/03/14  7:13 AM  Result Value Ref Range   Sodium 138 137 - 147 mEq/L     Potassium 4.8 3.7 - 5.3 mEq/L   Chloride 96 96 - 112 mEq/L   CO2 27 19 - 32 mEq/L   Glucose, Bld 88 70 - 99 mg/dL   BUN 51 (H) 6 - 23 mg/dL   Creatinine, Ser 5.25 (H) 0.50 - 1.35 mg/dL   Calcium 7.8 (L) 8.4 - 10.5 mg/dL   GFR calc non Af Amer 9 (L) >90 mL/min   GFR calc Af Amer 10 (L) >90 mL/min   Anion gap 15 5 - 15    Signed: Luan Moore, MD 11/03/2014, 4:30 PM

## 2014-11-03 NOTE — Progress Notes (Signed)
Utilization review completed.  

## 2014-11-03 NOTE — Plan of Care (Signed)
Problem: Phase I Progression Outcomes Goal: Hemodynamically stable Outcome: Progressing     

## 2014-11-03 NOTE — Progress Notes (Signed)
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 Presents with severe retrosternal chest pain and dyspnea with minimal exertion occurring over the last 3 days as well as graft infection of left groin. Pt had cardiac cath 12/2.  s/p removal of a left thigh infected dialysis graft 12/6.    PT Comments    Patient more agreeable to therapy today but discouraged at start of session about continued difficulty with standing and walking.  During first walking trial, pt complained of feeling as though his knees were going to buckle, although this did not occur.  Educated again on proper sit-to-stand technique, after which pt was able to stand more easily and ambulate farther (with chair to follow).  Will continue to work to improve functional mobility, strength, and endurance.   Follow Up Recommendations  SNF;Supervision/Assistance - 24 hour     Equipment Recommendations  3in1 (PT)    Recommendations for Other Services       Precautions / Restrictions Precautions Precautions: Fall Restrictions Weight Bearing Restrictions: No    Mobility  Bed Mobility               General bed mobility comments: pt sitting at sink on arrival and in recliner end of session  Transfers Overall transfer level: Needs assistance   Transfers: Sit to/from Stand Sit to Stand: Mod assist         General transfer comment: Max cues for hand placement, anterior translation, foot position, and body positioning to stand; required 2 trials; verbal cues to control descent to seated position and relase RW  Ambulation/Gait Ambulation/Gait assistance: Min assist Ambulation Distance (Feet): 78 Feet (18', then 78') Assistive device: Rolling walker (2 wheeled) Gait Pattern/deviations: Step-through  pattern;Decreased stride length;Trunk flexed;Shuffle   Gait velocity interpretation: Below normal speed for age/gender General Gait Details: cues for posture and position in RW with decreased need for assist and RW direction today   Stairs            Wheelchair Mobility    Modified Rankin (Stroke Patients Only)       Balance Overall balance assessment: Needs assistance   Sitting balance-Leahy Scale: Fair       Standing balance-Leahy Scale: Poor                      Cognition Arousal/Alertness: Awake/alert Behavior During Therapy: WFL for tasks assessed/performed   Area of Impairment: Problem solving;Safety/judgement             Problem Solving: Decreased initiation;Difficulty sequencing General Comments: impulsive with sitting     Exercises General Exercises - Lower Extremity Long Arc Quad: AROM;Seated;Both;20 reps Heel Slides: AROM;Seated;Both;10 reps Hip ABduction/ADduction: AROM;Seated;Both;10 reps Straight Leg Raises: AROM;Seated;Both;10 reps Hip Flexion/Marching: AROM;Seated;Both;20 reps Toe Raises: AROM;Seated;Both;10 reps    General Comments        Pertinent Vitals/Pain Pain Assessment: Faces Pain Score: 4  Pain Location: LLE Pain Intervention(s): Limited activity within patient's tolerance;Monitored during session;Repositioned    Home Living                      Prior Function            PT Goals (current goals can now be found in the care plan section) Progress towards PT goals: Progressing toward goals    Frequency  Min 3X/week    PT Plan Current plan remains appropriate    Co-evaluation             End of Session Equipment Utilized During Treatment: Gait belt Activity Tolerance: Patient tolerated treatment well Patient left: in chair;with call bell/phone within reach;with family/visitor present     Time: 1210-1236 PT Time Calculation (min) (ACUTE ONLY): 26 min  Charges:  $Gait Training: 8-22  mins $Therapeutic Exercise: 8-22 mins                    G Codes:      Dell Hurtubise SPT 11/03/2014, 12:54 PM

## 2014-11-03 NOTE — Plan of Care (Signed)
Problem: Phase I Progression Outcomes Goal: Other Phase I Outcomes/Goals Outcome: Completed/Met Date Met:  11/03/14  Problem: Phase III Progression Outcomes Goal: Discharge plan remains appropriate-arrangements made Outcome: Completed/Met Date Met:  11/03/14

## 2014-11-03 NOTE — Progress Notes (Signed)
  PROGRESS NOTE MEDICINE TEACHING ATTENDING   Day 5 of stay Patient name: Andrew [name]   Medical record number: [id number] Date of birth: 1926/03/03   I met with the patient this morning. I also met his daughter who was with him. He seems to be doing well and clinically improving. He has not had a fever in more than 24 hours now. I answered the daughters questions about discharge and change in medication to satisfaction. Change to PO antibiotics today. Awaiting SNF placement.   I saw and evaluated the patient.  I personally confirmed the key portions of the history and exam documented by Dr. Raelene Bott and I reviewed pertinent patient test results.  The assessment, diagnosis, and plan were formulated together and I agree with the documentation in the resident's note.  Please see the resident note for details.  [place], [place] 11/03/2014, 3:28 PM.

## 2014-11-03 NOTE — Progress Notes (Signed)
Assessment: 1. CP / hx CABG - cath showed patent grafts 2. Fevers / AVG drainage / +MRSA - s/p removal 3. ESRD on HD - using medial limb of thigh AVG for dialysis now.  4. HTN/ volume - chronic hypotension on midodrine, down to dry wt 5. MDS - pancytopenia from Revlimid, on hold. Weekly Granix, Dr Alvy Bimler following 6. Hx CVA 7. Hx recurrent bladder cancer  Plan:  He is going to continue with current care and treatment. I have advised that he carefully consider his options again once better and out of hospital.  Hemodialysis Wed.  Subjective: Interval History: Pt had discussion about his quality of life not being as good as he would like it.  He had questions about stopping dialysis.  Objective: Vital signs in last 24 hours: Temp:  [97.4 F (36.3 C)-98 F (36.7 C)] 98 F (36.7 C) (12/08 0525) Pulse Rate:  [78-117] 78 (12/08 0525) Resp:  [11-20] 18 (12/08 0525) BP: (74-122)/(35-88) 122/48 mmHg (12/08 0525) SpO2:  [93 %-100 %] 96 % (12/08 0525) Weight:  [78.1 kg (172 lb 2.9 oz)] 78.1 kg (172 lb 2.9 oz) (12/07 1248) Weight change:   Intake/Output from previous day: 12/07 0701 - 12/08 0700 In: 540 [P.O.:540] Out: 250  Intake/Output this shift:    General appearance: alert and cooperative Chest wall: no tenderness GI: soft, non-tender; bowel sounds normal; no masses,  no organomegaly Extremities: extremities normal, atraumatic, no cyanosis or edema  HOH  Lab Results:  Recent Labs  11/02/14 0624 11/03/14 0713  WBC 1.2* 1.8*  HGB 10.4* 8.2*  HCT 32.5* 25.6*  PLT 87* 87*   BMET:  Recent Labs  11/02/14 2142 11/03/14 0713  NA 139 138  K 4.7 4.8  CL 97 96  CO2 28 27  GLUCOSE 119* 88  BUN 38* 51*  CREATININE 4.61* 5.25*  CALCIUM 8.1* 7.8*   No results for input(s): PTH in the last 72 hours. Iron Studies: No results for input(s): IRON, TIBC, TRANSFERRIN, FERRITIN in the last 72 hours. Studies/Results: No results found.  Scheduled: . albuterol  10 mg  Nebulization Once  . allopurinol  100 mg Oral BID  . aspirin EC  81 mg Oral QHS  . atorvastatin  10 mg Oral q1800  . cinacalcet  30 mg Oral Q breakfast  . ciprofloxacin  500 mg Oral Q24H  . darbepoetin (ARANESP) injection - DIALYSIS  200 mcg Intravenous Q Mon-HD  . dextrose   Intravenous Once  . dextrose  1 ampule Intravenous Once  . doxercalciferol  3 mcg Intravenous Q M,W,F-HD  . doxycycline  100 mg Oral Q12H  . feeding supplement (NEPRO CARB STEADY)  237 mL Oral BID BM  . gabapentin  100 mg Oral Daily  . gabapentin  200 mg Oral QHS  . heparin  5,000 Units Subcutaneous 3 times per day  . insulin aspart  10 Units Intravenous Once  . midodrine  10 mg Oral TID WC  . polyethylene glycol  17 g Oral QHS  . sevelamer carbonate  2,400 mg Oral TID WC  . sodium chloride  3 mL Intravenous Q12H     LOS: 11 days   Andrew [name] C 11/03/2014,9:47 AM

## 2014-11-04 ENCOUNTER — Telehealth: Payer: Self-pay | Admitting: Surgery

## 2014-11-04 ENCOUNTER — Inpatient Hospital Stay
Admission: RE | Admit: 2014-11-04 | Discharge: 2014-11-09 | Disposition: A | Discharge status: Home / Self Care | Payer: Federal, State, Local not specified - PPO | Source: Ambulatory Visit | Attending: Internal Medicine | Admitting: Internal Medicine

## 2014-11-04 DIAGNOSIS — R7611 Nonspecific reaction to tuberculin skin test without active tuberculosis: Principal | ICD-10-CM

## 2014-11-04 LAB — BASIC METABOLIC PANEL
Anion gap: 18 — ABNORMAL HIGH (ref 5–15)
BUN: 68 mg/dL — AB (ref 6–23)
CALCIUM: 7.4 mg/dL — AB (ref 8.4–10.5)
CO2: 22 mEq/L (ref 19–32)
Chloride: 97 mEq/L (ref 96–112)
Creatinine, Ser: 6.61 mg/dL — ABNORMAL HIGH (ref 0.50–1.35)
GFR calc Af Amer: 8 mL/min — ABNORMAL LOW (ref >90)
GFR, EST NON AFRICAN AMERICAN: 7 mL/min — AB (ref >90)
GLUCOSE: 95 mg/dL (ref 70–99)
Potassium: 5 mEq/L (ref 3.7–5.3)
Sodium: 137 mEq/L (ref 137–147)

## 2014-11-04 LAB — CULTURE, BLOOD (ROUTINE X 2)
CULTURE: NO GROWTH
Culture: NO GROWTH

## 2014-11-04 LAB — CBC WITH DIFFERENTIAL/PLATELET
BASOS PCT: 1 % (ref 0–1)
Basophils Absolute: 0 10*3/uL (ref 0.0–0.1)
EOS ABS: 0.1 10*3/uL (ref 0.0–0.7)
Eosinophils Relative: 7 % — ABNORMAL HIGH (ref 0–5)
HEMATOCRIT: 26 % — AB (ref 39.0–52.0)
HEMOGLOBIN: 8.7 g/dL — AB (ref 13.0–17.0)
Lymphocytes Relative: 50 % — ABNORMAL HIGH (ref 12–46)
Lymphs Abs: 1.1 10*3/uL (ref 0.7–4.0)
MCH: 34.9 pg — ABNORMAL HIGH (ref 26.0–34.0)
MCHC: 33.5 g/dL (ref 30.0–36.0)
MCV: 104.4 fL — ABNORMAL HIGH (ref 78.0–100.0)
Monocytes Absolute: 0.2 10*3/uL (ref 0.1–1.0)
Monocytes Relative: 9 % (ref 3–12)
NEUTROS ABS: 0.7 10*3/uL — AB (ref 1.7–7.7)
Neutrophils Relative %: 33 % — ABNORMAL LOW (ref 43–77)
Platelets: 102 10*3/uL — ABNORMAL LOW (ref 150–400)
RBC: 2.49 MIL/uL — ABNORMAL LOW (ref 4.22–5.81)
RDW: 18.5 % — ABNORMAL HIGH (ref 11.5–15.5)
WBC: 2.1 10*3/uL — ABNORMAL LOW (ref 4.0–10.5)

## 2014-11-04 LAB — GLUCOSE, CAPILLARY: Glucose-Capillary: 91 mg/dL (ref 70–99)

## 2014-11-04 MED ORDER — DOXYCYCLINE HYCLATE 100 MG PO TABS: 100.0000 mg | ORAL_TABLET | Freq: Two times a day (BID) | ORAL | Status: AC

## 2014-11-04 MED ORDER — ALTEPLASE 2 MG IJ SOLR: 2.0000 mg | Freq: Once | INTRAMUSCULAR | Status: DC | PRN

## 2014-11-04 MED ORDER — CIPROFLOXACIN HCL 500 MG PO TABS: 500.0000 mg | ORAL_TABLET | ORAL | Status: AC

## 2014-11-04 MED ORDER — PENTAFLUOROPROP-TETRAFLUOROETH EX AERO: 1.0000 "application " | INHALATION_SPRAY | CUTANEOUS | Status: DC | PRN

## 2014-11-04 MED ORDER — HEPARIN SODIUM (PORCINE) 1000 UNIT/ML DIALYSIS: 1000.0000 [IU] | INTRAMUSCULAR | Status: DC | PRN

## 2014-11-04 MED ORDER — FENTANYL CITRATE 0.05 MG/ML IJ SOLN
25.0000 ug | INTRAMUSCULAR | Status: DC | PRN
Start: 2014-11-04 — End: 2014-11-04

## 2014-11-04 MED ORDER — LIDOCAINE HCL (PF) 1 % IJ SOLN: 5.0000 mL | INTRAMUSCULAR | Status: DC | PRN

## 2014-11-04 MED ORDER — SODIUM CHLORIDE 0.9 % IV SOLN: 100.0000 mL | INTRAVENOUS | Status: DC | PRN

## 2014-11-04 MED ORDER — NEPRO/CARBSTEADY PO LIQD: 237.0000 mL | ORAL | Status: DC | PRN

## 2014-11-04 MED ORDER — HEPARIN SODIUM (PORCINE) 1000 UNIT/ML DIALYSIS
20.0000 [IU]/kg | INTRAMUSCULAR | Status: DC | PRN
  Filled 2014-11-04: qty 2

## 2014-11-04 MED ORDER — LIDOCAINE-PRILOCAINE 2.5-2.5 % EX CREA: 1.0000 "application " | TOPICAL_CREAM | CUTANEOUS | Status: DC | PRN

## 2014-11-04 NOTE — Progress Notes (Signed)
Hemodialysis- Pt tolerated well, system clotted with 20 minutes remaining. Unable to rinse all blood back. Loss <100cc. No complaints. 2.3L UF.

## 2014-11-04 NOTE — Progress Notes (Signed)
  PROGRESS NOTE MEDICINE TEACHING ATTENDING   Day 39 of stay Patient name: Andrew [name]   Medical record number: [id number] Date of birth: [birth year]   Met with patient, Feels better, no fever since the past 48 hours. No chest pain.   Blood pressure 137/50, pulse 75, temperature 97.2 F (36.2 C), temperature source Oral, resp. rate 18, height $RemoveBe'5\' 8"'[id number]$  (1.727 m), weight 174 lb 6.1 oz (79.1 kg), SpO2 98 %.  No distress Heart - S1S2 regular rate and rhythm, no murmurs Lungs - clear No pedal edema Graft site - no erythema. No tenderness.   Plan: On PO antibiotics. HD today. DC to SNF today after HD. MDS - outpatient follow up. I have discussed the care of this patient with my IM team residents. Please see the resident note for details.  Alic[place], [place] 11/04/2014, 1:02 PM.

## 2014-11-04 NOTE — Progress Notes (Addendum)
Subjective:  Patient states that he continues to do well this morning. He is not reporting any complaints. Denies any fevers or chills overnight. Patient also states that he has been breathing well.  Objective: Vital signs in last 24 hours: Filed Vitals:   11/03/14 1430 11/03/14 2225 11/04/14 0442 11/04/14 0700  BP: 132/50 138/51 150/51 172/57  Pulse: 73 70 74 78  Temp: 97.5 F (36.4 C) 97.6 F (36.4 C) 97.5 F (36.4 C) 97.7 F (36.5 C)  TempSrc: Oral Oral Oral Oral  Resp: 16 17 18 19   Height:      Weight:   174 lb 6.1 oz (79.1 kg)   SpO2: 98% 98% 97% 98%   Weight change: 14.1 oz (0.4 kg) No intake or output data in the 24 hours ending 11/04/14 0828 Physical Exam  General: resting in bed, comfortable, NAD HEENT: Moist mucous membranes, hard of hearing Cardiac: 2/6 systolic murmur, no rubs or gallops Pulm: Clear to auscultation bilaterally, no wheezes rales or rhonchi Abd: soft, nondistended, BS present Ext: Left thigh status post surgery with dressing clean and intact, packing in place with no significant sanguinous or purulent drainage Neuro: Alert and oriented  Lab Results: Basic Metabolic Panel:  Recent Labs Lab 11/03/14 0713 11/04/14 0402  NA 138 137  K 4.8 5.0  CL 96 97  CO2 27 22  GLUCOSE 88 95  BUN 51* 68*  CREATININE 5.25* 6.61*  CALCIUM 7.8* 7.4*   Liver Function Tests: No results for input(s): AST, ALT, ALKPHOS, BILITOT, PROT, ALBUMIN in the last 168 hours. CBC:  Recent Labs Lab 11/03/14 0713 11/04/14 0402  WBC 1.8* 2.1*  NEUTROABS 0.8* PENDING  HGB 8.2* 8.7*  HCT 25.6* 26.0*  MCV 102.4* 104.4*  PLT 87* 102*    Micro Results: Recent Results (from the past 240 hour(s))  Culture, blood (routine x 2)     Status: None   Collection Time: 10/28/14 10:40 PM  Result Value Ref Range Status   Specimen Description BLOOD LEFT FOREARM  Final   Special Requests BOTTLES DRAWN AEROBIC AND ANAEROBIC 10CC  Final   Culture  Setup Time   Final   10/29/2014 04:29 Performed at [facility]    Culture   Final    NO GROWTH 5 DAYS Performed at [facility]    Report Status 11/04/2014 FINAL  Final  Culture, blood (routine x 2)     Status: None   Collection Time: 10/28/14 10:45 PM  Result Value Ref Range Status   Specimen Description BLOOD LEFT WRIST  Final   Special Requests BOTTLES DRAWN AEROBIC AND ANAEROBIC 5CC  Final   Culture  Setup Time   Final    10/29/2014 04:29 Performed at [facility]    Culture   Final    NO GROWTH 5 DAYS Performed at [facility]    Report Status 11/04/2014 FINAL  Final  Anaerobic culture     Status: None (Preliminary result)   Collection Time: 11/01/14  8:37 AM  Result Value Ref Range Status   Specimen Description WOUND LEFT THIGH GRAFT  Final   Special Requests NONE  Final   Gram Stain PENDING  Incomplete   Culture   Final    NO ANAEROBES ISOLATED; CULTURE IN PROGRESS FOR 5 DAYS Performed at [facility]    Report Status PENDING  Incomplete  Wound culture     Status: None   Collection Time: 11/01/14  8:37 AM  Result Value Ref  Range Status   Specimen Description WOUND LEFT THIGH GRAFT  Final   Special Requests NONE  Final   Gram Stain   Final    RARE WBC PRESENT, PREDOMINANTLY PMN NO SQUAMOUS EPITHELIAL CELLS SEEN NO ORGANISMS SEEN Performed at [facility]    Culture   Final    NO GROWTH 2 DAYS Performed at [facility]    Report Status 11/03/2014 FINAL  Final  Culture, blood (routine x 2)     Status: None (Preliminary result)   Collection Time: 11/01/14 11:20 PM  Result Value Ref Range Status   Specimen Description BLOOD RIGHT FOREARM  Final   Special Requests BOTTLES DRAWN AEROBIC AND ANAEROBIC 10CC EA  Final   Culture  Setup Time   Final    11/02/2014 09:32 Performed at [facility]    Culture   Final           BLOOD CULTURE RECEIVED NO GROWTH TO DATE CULTURE WILL BE HELD FOR 5 DAYS BEFORE ISSUING A FINAL  NEGATIVE REPORT Performed at [facility]    Report Status PENDING  Incomplete  Culture, blood (routine x 2)     Status: None (Preliminary result)   Collection Time: 11/01/14 11:27 PM  Result Value Ref Range Status   Specimen Description BLOOD RIGHT HAND  Final   Special Requests   Final    BOTTLES DRAWN AEROBIC AND ANAEROBIC 10CC EA 5CC RED   Culture  Setup Time   Final    11/02/2014 09:32 Performed at [facility]    Culture   Final           BLOOD CULTURE RECEIVED NO GROWTH TO DATE CULTURE WILL BE HELD FOR 5 DAYS BEFORE ISSUING A FINAL NEGATIVE REPORT Performed at [facility]    Report Status PENDING  Incomplete   Studies/Results: No results found.  Medications: I have reviewed the patient's current medications. Scheduled Meds: . albuterol  10 mg Nebulization Once  . allopurinol  100 mg Oral BID  . aspirin EC  81 mg Oral QHS  . atorvastatin  10 mg Oral q1800  . cinacalcet  30 mg Oral Q breakfast  . ciprofloxacin  500 mg Oral Q24H  . darbepoetin (ARANESP) injection - DIALYSIS  200 mcg Intravenous Q Mon-HD  . dextrose   Intravenous Once  . dextrose  1 ampule Intravenous Once  . doxercalciferol  3 mcg Intravenous Q M,W,F-HD  . doxycycline  100 mg Oral Q12H  . feeding supplement (NEPRO CARB STEADY)  237 mL Oral BID BM  . gabapentin  100 mg Oral Daily  . gabapentin  200 mg Oral QHS  . heparin  5,000 Units Subcutaneous 3 times per day  . insulin aspart  10 Units Intravenous Once  . midodrine  10 mg Oral TID WC  . polyethylene glycol  17 g Oral QHS  . sevelamer carbonate  2,400 mg Oral TID WC  . sodium chloride  3 mL Intravenous Q12H   Continuous Infusions:   PRN Meds:.sodium chloride, sodium chloride, sodium chloride, acetaminophen, acetaminophen, albuterol, docusate sodium, feeding supplement (NEPRO CARB STEADY), heparin, heparin, HYDROmorphone (DILAUDID) injection, lidocaine (PF), lidocaine-prilocaine, pentafluoroprop-tetrafluoroeth,  promethazine, sevelamer carbonate, sodium chloride Assessment/Plan:  Patient is an 80 year old with end-stage renal disease on hemodialysis, myelodysplastic syndrome, combined systolic and diastolic congestive heart failure, hyperlipidemia, and peripheral neuropathy hospitalized for unstable angina status post cardiac catheterization without intervention and MRSA graft site infection defunctionalized graft limb excision.  MRSA  graft site infection/fever: Patient postop day 3 from excision of defunctionalized graft limb. Patient has remained afebrile in the last 48 hours. Gram stain showing PMNs but no organisms seen. -Patient on day 2 of doxycycline and ciprofloxacin (day 3 total status post surgery on 11/01/2014) with a planned end date of 11/08/14. -Transition patient from vancomycin and Zosyn to doxycycline and ciprofloxacin pending Gram stain and culture results. Will narrow as appropriate. -Follow up final culture results  Unstable angina/coronary artery disease: Patient status post cardiac catheterization with suspected small circumflex and diagonal branches as the etiology for symptoms. However, intervention was not able to be performed. Will continue with risk profile modification.   - Continue with aspirin 81 mg and Lipitor 10 mg  Neutropenia: Patient continues to be stably neutropenic. Likely secondary to myelodysplastic syndrome (see below).  - Neutropenic precautions.  - Continue to monitor  ESRD: Patient due for dialysis today. -Continue home Sevelamer, Hectorol, mitodrine, Nepro  MDS: Pancytopenia related to this condition, and treatment side effects of Renvelid. Hemoglobin is overall stable at 8.7 today.  - Continue to follow as an outpatient as this could also be an exacerbating factor for the patient's dyspnea.  Combined systolic and diastolic CHF: EF 24%, grade 2 diastolic dysfunction, akinesis of the basal-midinferolateral and inferior myocardium per echo (11/28). Weight is  up to 174 from 169 on admission. Net output during hospitalization is 4.1 L out with 290 mL positive over the last 24 hours. -Daily weights & strict I&Os as noted above -dialysis as above  Gout: Continue allopurinol 100mg .  HLD: Continue statin as noted above.  Peripheral neuropathy: Continue home gabapentin.  Presumed COPD: No PFTs on file though will continue home albuterol.  - Consider outpatient PFT  FEN:   -Diet: Heart healthy diet  DVT prophylaxis: Subcutaneous heparin  Resolved issues:  - Possible HCAP: was on a course of ceftazidime in addition to vancomycin  - Hyperkalemia  - suspected laryngeal edema  CODE STATUS: FULL CODE  Dispo: Potentially today if patient continues to exhibit clinical stability.  The patient does have a current PCP Redmond School, MD) and does not need an [facility] hospital follow-up appointment after discharge.  The patient does have transportation limitations that hinder transportation to clinic appointments.   LOS: 12 days   Luan Moore, MD 11/04/2014, 8:28 AM

## 2014-11-04 NOTE — Discharge Instructions (Signed)
Please follow up with Dr. Arita Miss on 11/16/14 at 12:00 pm. Otherwise please continue with dialysis and follow up with Dr. Alvy Bimler for your blood counts.

## 2014-11-04 NOTE — Telephone Encounter (Signed)
-----   Message from Mena Goes, RN sent at 11/04/2014  9:55 AM EST ----- Regarding: Schedule   ----- Message -----    From: Ulyses Amor, PA-C    Sent: 11/04/2014   8:09 AM      To: [facility] Charge Pool  S/P removal of infected graft left thigh- f/u with Dr. Trula Slade in 2 weeks

## 2014-11-04 NOTE — Clinical Social Work Note (Signed)
Spoke to patient's daughter Montavius Subramaniam to inform her that [facility] can take patient later today after he has finished his dialysis.  Contacted [facility] to schedule transport and informed nurse that patient's transport has been scheduled.  Patient will discharge to [facility] once patient is ready for discharge.  Jones Broom. [place], MSW, [place] 11/04/2014 3:53 PM

## 2014-11-04 NOTE — Telephone Encounter (Signed)
Spoke with MD at [facility] to schedule for pt, dpm

## 2014-11-04 NOTE — Progress Notes (Signed)
Assessment: 1. CP / hx CABG - cath showed patent grafts 2. Fevers / AVG drainage / +MRSA - s/p removal 3. ESRD on HD - using medial limb of thigh AVG for dialysis now. Next treatment today. 4. HTN/ volume - chronic hypotension on midodrine, down to dry wt 5. MDS - pancytopenia from Revlimid, on hold. Weekly Granix, Dr Alvy Bimler following 6. Hx CVA 7. Hx recurrent bladder cancer  Plan: Hemodialysis today  Subjective: Interval History: Feels better today  Objective: Vital signs in last 24 hours: Temp:  [97.5 F (36.4 C)-97.7 F (36.5 C)] 97.7 F (36.5 C) (12/09 0700) Pulse Rate:  [70-84] 78 (12/09 0700) Resp:  [16-19] 19 (12/09 0700) BP: (132-172)/(50-57) 172/57 mmHg (12/09 0700) SpO2:  [97 %-98 %] 98 % (12/09 0700) Weight:  [79.1 kg (174 lb 6.1 oz)] 79.1 kg (174 lb 6.1 oz) (12/09 0442) Weight change: 0.4 kg (14.1 oz)  Intake/Output from previous day:   Intake/Output this shift:    General appearance: alert and cooperative Chest wall: no tenderness GI: soft, non-tender; bowel sounds normal; no masses,  no organomegaly Extremities: extremities normal, atraumatic, no cyanosis or edema  Lab Results:  Recent Labs  11/03/14 0713 11/04/14 0402  WBC 1.8* 2.1*  HGB 8.2* 8.7*  HCT 25.6* 26.0*  PLT 87* 102*   BMET:  Recent Labs  11/03/14 0713 11/04/14 0402  NA 138 137  K 4.8 5.0  CL 96 97  CO2 27 22  GLUCOSE 88 95  BUN 51* 68*  CREATININE 5.25* 6.61*  CALCIUM 7.8* 7.4*   No results for input(s): PTH in the last 72 hours. Iron Studies: No results for input(s): IRON, TIBC, TRANSFERRIN, FERRITIN in the last 72 hours. Studies/Results: No results found.  Scheduled: . albuterol  10 mg Nebulization Once  . allopurinol  100 mg Oral BID  . aspirin EC  81 mg Oral QHS  . atorvastatin  10 mg Oral q1800  . cinacalcet  30 mg Oral Q breakfast  . ciprofloxacin  500 mg Oral Q24H  . darbepoetin (ARANESP) injection - DIALYSIS  200 mcg Intravenous Q Mon-HD  . dextrose    Intravenous Once  . dextrose  1 ampule Intravenous Once  . doxercalciferol  3 mcg Intravenous Q M,W,F-HD  . doxycycline  100 mg Oral Q12H  . feeding supplement (NEPRO CARB STEADY)  237 mL Oral BID BM  . gabapentin  100 mg Oral Daily  . gabapentin  200 mg Oral QHS  . heparin  5,000 Units Subcutaneous 3 times per day  . insulin aspart  10 Units Intravenous Once  . midodrine  10 mg Oral TID WC  . polyethylene glycol  17 g Oral QHS  . sevelamer carbonate  2,400 mg Oral TID WC  . sodium chloride  3 mL Intravenous Q12H     LOS: 12 days   Andrew [name] C 11/04/2014,10:15 AM

## 2014-11-04 NOTE — Progress Notes (Addendum)
   [facility]  Subjective  - doing well over all going to dialysis today and possible SNF.   Objective 172/57 78 97.7 F (36.5 C) (Oral) 19 98% No intake or output data in the 24 hours ending 11/04/14 0758  Left thigh wound clean with healthy skin edges Wound base pink Wet to dry dressing placed ion wound.  Assessment/Planning: POD # 3 Assessmeincision and drainage with graft removal of infected left thigh graft F/U with Dr. Trula Slade in 2 weeks after discharge  Wet to dry dressing changes 2 times per day.  Laurence Slate [facility] 11/04/2014 7:58 AM --  Laboratory Lab Results:  Recent Labs  11/03/14 0713 11/04/14 0402  WBC 1.8* 2.1*  HGB 8.2* 8.7*  HCT 25.6* 26.0*  PLT 87* 102*   BMET  Recent Labs  11/03/14 0713 11/04/14 0402  NA 138 137  K 4.8 5.0  CL 96 97  CO2 27 22  GLUCOSE 88 95  BUN 51* 68*  CREATININE 5.25* 6.61*  CALCIUM 7.8* 7.4*    COAG Lab Results  Component Value Date   INR 1.10 11/01/2014   INR 1.22 10/26/2014   INR 1.25 06/27/2013   No results found for: PTT    Agree with the above OK to d/c from my perspective Continue wet to dry dressing changes Would hold off on wound vac for now  [employer]

## 2014-11-05 ENCOUNTER — Other Ambulatory Visit: Payer: Self-pay | Admitting: *Deleted

## 2014-11-05 ENCOUNTER — Non-Acute Institutional Stay (SKILLED_NURSING_FACILITY): Payer: Medicare Other | Admitting: Internal Medicine

## 2014-11-05 ENCOUNTER — Encounter (HOSPITAL_COMMUNITY): Payer: Self-pay | Admitting: Cardiology

## 2014-11-05 DIAGNOSIS — I5020 Unspecified systolic (congestive) heart failure: Secondary | ICD-10-CM

## 2014-11-05 DIAGNOSIS — T82511D Breakdown (mechanical) of surgically created arteriovenous shunt, subsequent encounter: Secondary | ICD-10-CM

## 2014-11-05 DIAGNOSIS — Z992 Dependence on renal dialysis: Secondary | ICD-10-CM

## 2014-11-05 DIAGNOSIS — N186 End stage renal disease: Secondary | ICD-10-CM

## 2014-11-05 DIAGNOSIS — D61818 Other pancytopenia: Secondary | ICD-10-CM

## 2014-11-05 MED ORDER — OXYCODONE-ACETAMINOPHEN 5-325 MG PO TABS: ORAL_TABLET | ORAL | Status: DC

## 2014-11-05 NOTE — Clinical Social Work Note (Addendum)
Patient to be d/c'ed today to [place] center.  Patient and family agreeable to plans will transport via ems RN to call report.  Patient's daughter aware of discharge and stated she was pleased with care that was provided.  Evette Cristal, MSW, [place]

## 2014-11-05 NOTE — Progress Notes (Signed)
Patient ID: Andrew [name], male   DOB: 1926/10/06, 80 y.o.   MRN: [id number]     this is an acute visit.  Level care skilled.  Facility [facility].  Chief complaint-acute visit status post hospitalization for graft site infection with MRSA-unstable angina.  History of present illness.  Patient is a pleasant 80 year old male with a history of end-stage renal disease on chronic hemodialysis as well as coronary artery disease myelodysplastic syndrome and combined systolic and diastolic CHF.  Resent into the hospital with chest pain and shortness of breath.  He also reported blood coming out of his left right AV fistula.  With a history of recurrent graft site infection.  He was started empirically on vancomycin and [facility] cardiology was consulted.  In regards to graft infection his antibiotic eventually was titrated to doxycycline.   however CT scan showed a suspected abscess-he was converted back to vancomycin and Zosyn taken to the operating room for an incision and drainage along with graft removal.  He has been discharged on oral doxycycline and ciprofloxacin.  In regards to the chest pain this was consistent with prior episodes-intervention was not performed-he was continued with risk profile modification with aspirin and Lipitor.  Patient did continue his dialysis during his hospitalization.  He does have a history of pancytopenia secondary myelodysplastic syndrome-he had a brief trial on Revlimid which improved his blood counts-however over time his blood count deteriorated-he received Aranesp by periodic injection-he was briefly restarted on Revlimid med but had problems of exaggerated myelosuppression and this was decreased to a dose of 2.5 mg once a week only if absolute neutrophil count was over thousand the Revlimid was DC'd before discharge.  Patient also has a history of combined systolic and diastolic CHF with an ejection fraction of 45% and grade 2 diastolic  dysfunction apparently this was not an acute issue during this hospitalization.  Previous medical history.  History of graft infection vascular device.  Coronary artery disease.  Unstable angina.  Aortic stenosis.  End-stage renal disease on dialysis.  Hyperkalemia.  Family medical social history is been reviewed per discharge note on 11/03/2014.  Medications have been reviewed per MAR.  Review of systems.  General does not complain of fever chills does complain of feeling weak.  Skin is warm and dry left inner thigh graft area there is an open area the wound appears clean without any odor or drainage or surrounding erythema-the tissue appears pink.  Eyes pupils appear reactive light visual acuity appears grossly intact.  Oropharynx is clear mucous membranes moist.  Chest is clear to auscultation there is no labored breathing.  Heart is regular rate and rhythm without murmur gallop or rub.  He has minimal lower extremity edema pedal pulses are intact bilaterally.  Abdomen is soft nontender with positive bowel sounds.  Muscle skeletal does have generalized weakness is able to move all extremities 4 I do not note any deformities he does have a healed surgical scar in the left lower leg.  Neurologic is grossly intact speech is clear I could not appreciate any lateralizing findings cranial nerves grossly intact.  Psych he is alert and oriented pleasant and appropriate.   labs.  11/05/2014.  Sodium 139 potassium 4.7 BUN 45 creatinine 5.39.  WBC 1.7 hemoglobin 8.8 platelets 126.  Assessment and plan.  #1-history of graft site infection with MRSA and abscess-at this point appears to be stable --he is completing antibiotics-will be monitored by wound care at facility follow-up as needed--.  #2  history coronary artery disease-again this was treated conservatively he continues on aspirin as well as a statin  #3-end-stage renal disease he continues on dialysis appears  to tolerate this fairly well.  #3 history of myelodysplastic syndrome this is somewhat of a challenging situation as noted above Revlimid med has been [place] for now-will update a CBC and metabolic panel on Monday, December 14.  #4-history of CHF- combined systolic and diastolic-monitor weights daily notify provider of gain greater than 3 pounds at this point appears to be clinically stable  #5-history of gout he is on colchicine when necessary as well as Allopurinoll no routinely this is been stable.  History of hyperkalemia-again will update a metabolic panel I suspect dialysis is keeping and I on this as well.  COPD-this appears to be stable he continues on when necessary nebulizers.  History of neuropathy-continues on Neurontin  .    [id number] note greater than 35 minutes spent assessing patient-discussing his status with nursing staff as well with family at bedside as well as reviewing his chart-and coordinating and formulating a plan of care for numerous diagnoses-of note greater than 50% of time spent coordinating plan of care  .      Marland Kitchen   Marland Kitchen

## 2014-11-05 NOTE — Telephone Encounter (Signed)
[facility] 

## 2014-11-06 LAB — ANAEROBIC CULTURE

## 2014-11-07 ENCOUNTER — Inpatient Hospital Stay (HOSPITAL_COMMUNITY)
Admit: 2014-11-07 | Discharge: 2014-11-07 | Disposition: A | Discharge status: Home / Self Care | Payer: Medicare Other | Attending: Internal Medicine | Admitting: Internal Medicine

## 2014-11-08 ENCOUNTER — Encounter: Payer: Self-pay | Admitting: Internal Medicine

## 2014-11-08 LAB — CULTURE, BLOOD (ROUTINE X 2)
CULTURE: NO GROWTH
CULTURE: NO GROWTH

## 2014-11-09 ENCOUNTER — Non-Acute Institutional Stay (SKILLED_NURSING_FACILITY): Payer: Medicare Other | Admitting: Internal Medicine

## 2014-11-09 DIAGNOSIS — Y841 Kidney dialysis as the cause of abnormal reaction of the patient, or of later complication, without mention of misadventure at the time of the procedure: Secondary | ICD-10-CM

## 2014-11-09 DIAGNOSIS — T829XXA Unspecified complication of cardiac and vascular prosthetic device, implant and graft, initial encounter: Secondary | ICD-10-CM

## 2014-11-09 DIAGNOSIS — D469 Myelodysplastic syndrome, unspecified: Secondary | ICD-10-CM

## 2014-11-09 DIAGNOSIS — I5020 Unspecified systolic (congestive) heart failure: Secondary | ICD-10-CM

## 2014-11-09 DIAGNOSIS — I25110 Atherosclerotic heart disease of native coronary artery with unstable angina pectoris: Secondary | ICD-10-CM

## 2014-11-10 ENCOUNTER — Other Ambulatory Visit (HOSPITAL_BASED_OUTPATIENT_CLINIC_OR_DEPARTMENT_OTHER): Payer: Medicare Other

## 2014-11-10 ENCOUNTER — Ambulatory Visit: Payer: Medicare Other

## 2014-11-10 ENCOUNTER — Telehealth: Payer: Self-pay | Admitting: *Deleted

## 2014-11-10 ENCOUNTER — Ambulatory Visit (HOSPITAL_COMMUNITY)
Admission: RE | Admit: 2014-11-10 | Discharge: 2014-11-10 | Disposition: A | Discharge status: Home / Self Care | Payer: Medicare Other | Source: Ambulatory Visit | Attending: Hematology and Oncology | Admitting: Hematology and Oncology

## 2014-11-10 ENCOUNTER — Other Ambulatory Visit: Payer: Self-pay | Admitting: Hematology and Oncology

## 2014-11-10 ENCOUNTER — Ambulatory Visit (HOSPITAL_BASED_OUTPATIENT_CLINIC_OR_DEPARTMENT_OTHER): Payer: Medicare Other

## 2014-11-10 VITALS — BP 136/48 | HR 91 | Temp 98.2°F | Resp 20

## 2014-11-10 DIAGNOSIS — D701 Agranulocytosis secondary to cancer chemotherapy: Secondary | ICD-10-CM

## 2014-11-10 DIAGNOSIS — D46C Myelodysplastic syndrome with isolated del(5q) chromosomal abnormality: Secondary | ICD-10-CM

## 2014-11-10 DIAGNOSIS — D649 Anemia, unspecified: Secondary | ICD-10-CM

## 2014-11-10 DIAGNOSIS — D630 Anemia in neoplastic disease: Secondary | ICD-10-CM | POA: Insufficient documentation

## 2014-11-10 DIAGNOSIS — D72819 Decreased white blood cell count, unspecified: Secondary | ICD-10-CM

## 2014-11-10 DIAGNOSIS — T451X5A Adverse effect of antineoplastic and immunosuppressive drugs, initial encounter: Principal | ICD-10-CM

## 2014-11-10 LAB — CBC WITH DIFFERENTIAL/PLATELET
BASO%: 1.5 % (ref 0.0–2.0)
Basophils Absolute: 0 10*3/uL (ref 0.0–0.1)
EOS ABS: 0.1 10*3/uL (ref 0.0–0.5)
EOS%: 4.9 % (ref 0.0–7.0)
HCT: 23.6 % — ABNORMAL LOW (ref 38.4–49.9)
HGB: 7.5 g/dL — ABNORMAL LOW (ref 13.0–17.1)
LYMPH#: 0.8 10*3/uL — AB (ref 0.9–3.3)
LYMPH%: 43.9 % (ref 14.0–49.0)
MCH: 33.7 pg — ABNORMAL HIGH (ref 27.2–33.4)
MCHC: 32.1 g/dL (ref 32.0–36.0)
MCV: 105.1 fL — ABNORMAL HIGH (ref 79.3–98.0)
MONO#: 0.2 10*3/uL (ref 0.1–0.9)
MONO%: 13.4 % (ref 0.0–14.0)
NEUT#: 0.7 10*3/uL — ABNORMAL LOW (ref 1.5–6.5)
NEUT%: 36.3 % — ABNORMAL LOW (ref 39.0–75.0)
PLATELETS: 102 10*3/uL — AB (ref 140–400)
RBC: 2.24 10*6/uL — AB (ref 4.20–5.82)
RDW: 20.7 % — AB (ref 11.0–14.6)
WBC: 1.8 10*3/uL — ABNORMAL LOW (ref 4.0–10.3)

## 2014-11-10 LAB — PREPARE RBC (CROSSMATCH)

## 2014-11-10 MED ORDER — HEPARIN SOD (PORK) LOCK FLUSH 100 UNIT/ML IV SOLN: 500.0000 [IU] | Freq: Every day | INTRAVENOUS | Status: DC | PRN

## 2014-11-10 MED ORDER — SODIUM CHLORIDE 0.9 % IV SOLN: 250.0000 mL | Freq: Once | INTRAVENOUS | Status: DC

## 2014-11-10 MED ORDER — HEPARIN SOD (PORK) LOCK FLUSH 100 UNIT/ML IV SOLN: 250.0000 [IU] | INTRAVENOUS | Status: DC | PRN

## 2014-11-10 MED ORDER — SODIUM CHLORIDE 0.9 % IJ SOLN: 10.0000 mL | INTRAMUSCULAR | Status: DC | PRN

## 2014-11-10 MED ORDER — SODIUM CHLORIDE 0.9 % IJ SOLN: 3.0000 mL | INTRAMUSCULAR | Status: DC | PRN

## 2014-11-10 MED ORDER — TBO-FILGRASTIM 480 MCG/0.8ML ~~LOC~~ SOSY
480.0000 ug | PREFILLED_SYRINGE | Freq: Once | SUBCUTANEOUS | Status: AC
  Administered 2014-11-10: 480 ug via SUBCUTANEOUS
  Filled 2014-11-10: qty 0.8

## 2014-11-10 NOTE — Procedures (Signed)
[facility]  Procedure Note  Andrew [name] PVX:[id number] DOB: [birth year] DOA: 11/10/2014   Dr. 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   [name], RN  [facility]

## 2014-11-10 NOTE — Progress Notes (Addendum)
Patient ID: Andrew [name], male   DOB: 05-10-1926, 80 y.o.   MRN: [id number]               HISTORY & PHYSICAL  DATE:  11/09/2014    FACILITY: [facility]    LEVEL OF CARE:   SNF   CHIEF COMPLAINT:  Admission to SNF, post stay at [facility] from 10/23/2014 through 11/03/2014.    HISTORY OF PRESENT ILLNESS:  This is a patient who presented to hospital with chest pain.  He has a history of a CABG in 1998.  The patient describes shortness of breath but not real chest pain to me today, however, with minimal exertion.  The pain is similar to what he had in 1998 that ultimately ended up with his CABG.    The other major issue is that the patient gets dialyzed Monday, Wednesday and Friday, and reported blood coming out of his left thigh fistula.  Nephrology noted purulent drainage from the non-functional portion of his AV graft and started him on vancomycin and Fortaz.  Cardiology recommended heparin.    Through the course of the hospitalization, the patient was treated initially with vancomycin and ceftazidime, eventually to vancomycin and discharged on doxycycline.  He was noted to have improvement in the area.  He continued to dialyze in this area.    With regards to his coronary artery disease, he had a cardiac catheterization on 10/27/2014 without interventions at the time.  His echocardiograms were remarkable for akinesis of the basal, mid, inferolateral, and inferior myocardium.  He was continued with ASA and Lipitor.    PAST MEDICAL HISTORY/PROBLEM LIST:             Coronary artery disease, which is active with unstable angina.    History of mild aortic stenosis.    Vascular graft infection in the left thigh.  Successfully treated.  He has an open area here.    End-stage renal disease.  On dialysis.    Hyperkalemia.    History of myelodysplastic syndrome, which is apparently longstanding.    CURRENT MEDICATIONS:  Discharge medications include:     Tylenol 500 q.6  p.r.n.    Proventil 2 puffs q.6 hours.    Zyloprim 100 mg daily, presumably gout.    ASA 81 q.d.    Lipitor 10 q.d.    Calcium carbonate 500, 1 tablet daily for heartburn.    Sensipar 30 mg daily with breakfast.    Colchicine 0.6 b.i.d.    Dialyvite 1 mg daily.    Colace 100 daily p.r.n. for constipation.    Econazole nitrate cream for athlete's foot p.r.n.    Neurontin 100 mg in the morning and 200 at night.    Granix 480 mcg injected into the skin once a week on Tuesdays.    Midodrine 20 mg on dialysis mornings and one in the evening, and then the other days 10 mg in the morning and 2 in the evening.    MiraLAX 17 g daily.    Percocet p.r.n.     Revlimid.  This was discontinued during the hospitalization.    SOCIAL HISTORY:      HOUSING:  The patient lives locally.   FUNCTIONAL STATUS:  Claims to be functionally independent.   CODE STATUS:  Full Code status noted.    REVIEW OF SYSTEMS:   CHEST/RESPIRATORY:  Short of breath with minimal exertion.  This complaint continues without clear chest pain.   CARDIAC:   See  above.  No palpitations.   GI:  No nausea, vomiting or diarrhea.   MUSCULOSKELETAL:  He is complaining of pain in the left knee.    PHYSICAL EXAMINATION:   GENERAL APPEARANCE:  The patient is awake, conversational.  Able to give accounts of most of his history.   HEENT:   MOUTH/THROAT:   No oral lesions seen.   CHEST/RESPIRATORY:  Clear air entry bilaterally.   CARDIOVASCULAR:  CARDIAC:  There is a midsystolic murmur at the aortic area that radiates into the left carotid.  No signs of congestive heart failure.   GASTROINTESTINAL:  ABDOMEN:   Soft.   LIVER/SPLEEN/KIDNEYS:  No liver, no spleen.  No tenderness.   GENITOURINARY:  BLADDER:   Not distended.   MUSCULOSKELETAL:    EXTREMITIES:   LEFT LOWER EXTREMITY:  There is some tenderness and an effusion in the left knee, although this is not warm.   SKIN:  INSPECTION:  The area in question at his  left groin site looks clean.  There is no overt infection here.  Apparently, they are still dialyzing him in this area.    ASSESSMENT/PLAN:                    Infected left upper thigh dialysis graft.  Part of this was removed.  Apparently, they are still dialyzing him in this area.  He dialyzes Monday, Wednesday, and Friday.  He has completed his antibiotics.    Unstable coronary syndrome.  Presented with unstable angina.  He had apparently just had a recent cardiac cath.   Not felt to be a current candidate for interventions, as far as I am aware.  He still states he is having shortness of breath with minimal exertion.    History of myelodysplastic syndrome.  His Revlimid was stopped in the hospital.  Blood counts from lab work today showed a white count of 2.3, hemoglobin of 7.9, and a platelet count of 106, compared with 1.8, 8.2 and 87, respectively, in the hospital.  I guess the concerning issue here would be his anemia vis-a-vis his unstable coronary syndrome.  I am uncertain of what he is receiving at dialysis.    History of gout.  He has some tenderness in his left knee, and an effusion.  This will need to be monitored.    Granix listed on his medication list.  Not sure where this is being given  Orthostatic intolerance.  Listed as being secondary to dialysis.    Chronic renal failure.  The exact etiology of this is not clear.  He has had a previous nephrectomy.    Combined systolic and diastolic heart failure.  His ejection fraction is 45%.  Grade 2 diastolic dysfunction.  Weight was at 172 pounds.  This will need to be monitored in conjunction with dialysis.    The patient's shunt site looks stable.  Surgical wound looks stable, as well.  I think they are putting a wet-to-dry dressing on this.

## 2014-11-10 NOTE — Patient Instructions (Signed)

## 2014-11-10 NOTE — Telephone Encounter (Signed)
Informed daughter of Hgb 7.5 and pt needs one unit of blood today per Dr. Alvy Bimler.  He is scheduled at [facility] at 4 pm for transfusion but he needs to come back to our lab for type and cross.  Pt has already left our clinic.   Daughter will call pt's transportation and have him come back to [place] at 3 pm and go to [facility] at 4 pm.

## 2014-11-11 LAB — TYPE AND SCREEN
ABO/RH(D): O POS
ANTIBODY SCREEN: NEGATIVE
UNIT DIVISION: 0

## 2014-11-13 ENCOUNTER — Encounter: Payer: Self-pay | Admitting: Surgery

## 2014-11-16 ENCOUNTER — Ambulatory Visit (INDEPENDENT_AMBULATORY_CARE_PROVIDER_SITE_OTHER): Payer: Self-pay | Admitting: Surgery

## 2014-11-16 ENCOUNTER — Encounter: Payer: Medicare Other | Admitting: Adult Health

## 2014-11-16 ENCOUNTER — Encounter: Payer: Self-pay | Admitting: Surgery

## 2014-11-16 ENCOUNTER — Encounter: Payer: Medicare Other | Admitting: Surgery

## 2014-11-16 VITALS — BP 174/74 | HR 80 | Temp 97.2°F | Resp 18 | Ht 68.0 in | Wt 174.0 lb

## 2014-11-16 DIAGNOSIS — N186 End stage renal disease: Secondary | ICD-10-CM

## 2014-11-16 NOTE — Progress Notes (Signed)
The patient is back today for follow-up.  He recently underwent removal of a infected nonfunctional lysed segment of a left thigh dialysis graft.  He has been doing wet-to-dry dressing changes at home.  The wound is healing.  There are 2 sutures near the apex where the new, functioning graft was exposed.  The wound bed has good granulation tissue.  The patient will continue with daily dressing changes with hydrogel.  He will come back in 2 weeks for the nurses to remove the 2 sutures at the apex of the wound.  I will see him back in 6 weeks.

## 2014-11-17 ENCOUNTER — Telehealth: Payer: Self-pay

## 2014-11-17 ENCOUNTER — Other Ambulatory Visit: Payer: Self-pay | Admitting: Hematology and Oncology

## 2014-11-17 ENCOUNTER — Ambulatory Visit (HOSPITAL_BASED_OUTPATIENT_CLINIC_OR_DEPARTMENT_OTHER): Payer: Medicare Other | Admitting: Lab

## 2014-11-17 ENCOUNTER — Ambulatory Visit (HOSPITAL_BASED_OUTPATIENT_CLINIC_OR_DEPARTMENT_OTHER): Payer: Medicare Other

## 2014-11-17 ENCOUNTER — Ambulatory Visit: Payer: Medicare Other | Admitting: Lab

## 2014-11-17 DIAGNOSIS — T451X5A Adverse effect of antineoplastic and immunosuppressive drugs, initial encounter: Principal | ICD-10-CM

## 2014-11-17 DIAGNOSIS — D630 Anemia in neoplastic disease: Secondary | ICD-10-CM | POA: Diagnosis present

## 2014-11-17 DIAGNOSIS — D701 Agranulocytosis secondary to cancer chemotherapy: Secondary | ICD-10-CM

## 2014-11-17 DIAGNOSIS — D649 Anemia, unspecified: Secondary | ICD-10-CM

## 2014-11-17 DIAGNOSIS — D46C Myelodysplastic syndrome with isolated del(5q) chromosomal abnormality: Secondary | ICD-10-CM

## 2014-11-17 LAB — CBC WITH DIFFERENTIAL/PLATELET
BASO%: 0.6 % (ref 0.0–2.0)
BASOS ABS: 0 10*3/uL (ref 0.0–0.1)
EOS%: 3.2 % (ref 0.0–7.0)
Eosinophils Absolute: 0.1 10*3/uL (ref 0.0–0.5)
HCT: 25.8 % — ABNORMAL LOW (ref 38.4–49.9)
HGB: 8.1 g/dL — ABNORMAL LOW (ref 13.0–17.1)
LYMPH#: 0.9 10*3/uL (ref 0.9–3.3)
LYMPH%: 56.5 % — ABNORMAL HIGH (ref 14.0–49.0)
MCH: 32.3 pg (ref 27.2–33.4)
MCHC: 31.4 g/dL — AB (ref 32.0–36.0)
MCV: 102.8 fL — ABNORMAL HIGH (ref 79.3–98.0)
MONO#: 0.2 10*3/uL (ref 0.1–0.9)
MONO%: 9.7 % (ref 0.0–14.0)
NEUT#: 0.5 10*3/uL — CL (ref 1.5–6.5)
NEUT%: 30 % — AB (ref 39.0–75.0)
Platelets: 80 10*3/uL — ABNORMAL LOW (ref 140–400)
RBC: 2.51 10*6/uL — ABNORMAL LOW (ref 4.20–5.82)
RDW: 22.7 % — AB (ref 11.0–14.6)
WBC: 1.5 10*3/uL — ABNORMAL LOW (ref 4.0–10.3)
nRBC: 0 % (ref 0–0)

## 2014-11-17 LAB — HOLD TUBE, BLOOD BANK

## 2014-11-17 MED ORDER — TBO-FILGRASTIM 480 MCG/0.8ML ~~LOC~~ SOSY
480.0000 ug | PREFILLED_SYRINGE | Freq: Once | SUBCUTANEOUS | Status: AC
  Administered 2014-11-17: 480 ug via SUBCUTANEOUS
  Filled 2014-11-17: qty 0.8

## 2014-11-17 NOTE — Patient Instructions (Signed)

## 2014-11-17 NOTE — Telephone Encounter (Signed)
Informed pt blood transfusion has been scheduled for Thursday at 830am for 1 unit. Pt verbalized understanding.

## 2014-11-17 NOTE — Progress Notes (Signed)
Pt in for Granix injection today, Injections given without any difficulties, pt informed of neutropenic precautions and maskes were given to pt. Pt HGB is 8.1 today, pt not feeling well. Informed Dr. Alvy Bimler, [facility] was called for 1 unit by Tammi, RN on Thursday, no availabilities for blood transfusion today, and pt has dialysis on Wednesday. Pt informed he would go to lab for type and cross today and Dr. Calton Dach nurse will call and inform pt of time and place for blood on Thursday. Pt and pt caregiver verbalized understanding and denies any other questions or concerns at this time.

## 2014-11-18 ENCOUNTER — Inpatient Hospital Stay (HOSPITAL_COMMUNITY)
Admission: EM | Admit: 2014-11-18 | Discharge: 2014-11-19 | DRG: 280 | Disposition: A | Discharge status: Home / Self Care | Payer: Medicare Other | Attending: Internal Medicine | Admitting: Internal Medicine

## 2014-11-18 ENCOUNTER — Ambulatory Visit (HOSPITAL_COMMUNITY): Payer: Medicare Other

## 2014-11-18 ENCOUNTER — Encounter (HOSPITAL_COMMUNITY)
Admission: EM | Disposition: A | Discharge status: Home / Self Care | Payer: Self-pay | Source: Home / Self Care | Attending: Internal Medicine

## 2014-11-18 ENCOUNTER — Other Ambulatory Visit: Payer: Self-pay

## 2014-11-18 ENCOUNTER — Encounter (HOSPITAL_COMMUNITY): Payer: Self-pay

## 2014-11-18 DIAGNOSIS — I272 Other secondary pulmonary hypertension: Secondary | ICD-10-CM | POA: Diagnosis present

## 2014-11-18 DIAGNOSIS — M109 Gout, unspecified: Secondary | ICD-10-CM | POA: Diagnosis present

## 2014-11-18 DIAGNOSIS — G629 Polyneuropathy, unspecified: Secondary | ICD-10-CM | POA: Diagnosis present

## 2014-11-18 DIAGNOSIS — Z992 Dependence on renal dialysis: Secondary | ICD-10-CM

## 2014-11-18 DIAGNOSIS — I213 ST elevation (STEMI) myocardial infarction of unspecified site: Secondary | ICD-10-CM | POA: Diagnosis present

## 2014-11-18 DIAGNOSIS — R0602 Shortness of breath: Secondary | ICD-10-CM

## 2014-11-18 DIAGNOSIS — Z8614 Personal history of Methicillin resistant Staphylococcus aureus infection: Secondary | ICD-10-CM

## 2014-11-18 DIAGNOSIS — I25110 Atherosclerotic heart disease of native coronary artery with unstable angina pectoris: Secondary | ICD-10-CM | POA: Diagnosis present

## 2014-11-18 DIAGNOSIS — H9190 Unspecified hearing loss, unspecified ear: Secondary | ICD-10-CM | POA: Diagnosis present

## 2014-11-18 DIAGNOSIS — I5042 Chronic combined systolic (congestive) and diastolic (congestive) heart failure: Secondary | ICD-10-CM | POA: Diagnosis present

## 2014-11-18 DIAGNOSIS — D630 Anemia in neoplastic disease: Secondary | ICD-10-CM | POA: Diagnosis present

## 2014-11-18 DIAGNOSIS — Z87891 Personal history of nicotine dependence: Secondary | ICD-10-CM

## 2014-11-18 DIAGNOSIS — Z85528 Personal history of other malignant neoplasm of kidney: Secondary | ICD-10-CM | POA: Diagnosis not present

## 2014-11-18 DIAGNOSIS — E785 Hyperlipidemia, unspecified: Secondary | ICD-10-CM | POA: Diagnosis present

## 2014-11-18 DIAGNOSIS — Z7982 Long term (current) use of aspirin: Secondary | ICD-10-CM | POA: Diagnosis not present

## 2014-11-18 DIAGNOSIS — K219 Gastro-esophageal reflux disease without esophagitis: Secondary | ICD-10-CM | POA: Diagnosis present

## 2014-11-18 DIAGNOSIS — R079 Chest pain, unspecified: Secondary | ICD-10-CM | POA: Diagnosis present

## 2014-11-18 DIAGNOSIS — I2510 Atherosclerotic heart disease of native coronary artery without angina pectoris: Secondary | ICD-10-CM | POA: Diagnosis present

## 2014-11-18 DIAGNOSIS — N186 End stage renal disease: Secondary | ICD-10-CM | POA: Diagnosis present

## 2014-11-18 DIAGNOSIS — D46C Myelodysplastic syndrome with isolated del(5q) chromosomal abnormality: Secondary | ICD-10-CM | POA: Diagnosis present

## 2014-11-18 DIAGNOSIS — I739 Peripheral vascular disease, unspecified: Secondary | ICD-10-CM | POA: Diagnosis present

## 2014-11-18 DIAGNOSIS — E875 Hyperkalemia: Secondary | ICD-10-CM | POA: Diagnosis present

## 2014-11-18 DIAGNOSIS — I120 Hypertensive chronic kidney disease with stage 5 chronic kidney disease or end stage renal disease: Secondary | ICD-10-CM | POA: Diagnosis present

## 2014-11-18 DIAGNOSIS — Z905 Acquired absence of kidney: Secondary | ICD-10-CM | POA: Diagnosis present

## 2014-11-18 DIAGNOSIS — N2581 Secondary hyperparathyroidism of renal origin: Secondary | ICD-10-CM | POA: Diagnosis present

## 2014-11-18 DIAGNOSIS — I10 Essential (primary) hypertension: Secondary | ICD-10-CM | POA: Diagnosis present

## 2014-11-18 DIAGNOSIS — I350 Nonrheumatic aortic (valve) stenosis: Secondary | ICD-10-CM | POA: Diagnosis present

## 2014-11-18 DIAGNOSIS — D61818 Other pancytopenia: Secondary | ICD-10-CM | POA: Diagnosis present

## 2014-11-18 DIAGNOSIS — Z8711 Personal history of peptic ulcer disease: Secondary | ICD-10-CM | POA: Diagnosis not present

## 2014-11-18 DIAGNOSIS — D696 Thrombocytopenia, unspecified: Secondary | ICD-10-CM

## 2014-11-18 DIAGNOSIS — Z951 Presence of aortocoronary bypass graft: Secondary | ICD-10-CM | POA: Diagnosis not present

## 2014-11-18 DIAGNOSIS — I209 Angina pectoris, unspecified: Secondary | ICD-10-CM

## 2014-11-18 DIAGNOSIS — I200 Unstable angina: Secondary | ICD-10-CM

## 2014-11-18 HISTORY — DX: Infection and inflammatory reaction due to other cardiac and vascular devices, implants and grafts, initial encounter: T82.7XXA

## 2014-11-18 LAB — PREPARE RBC (CROSSMATCH)

## 2014-11-18 LAB — BASIC METABOLIC PANEL
ANION GAP: 10 (ref 5–15)
BUN: 44 mg/dL — AB (ref 6–23)
CALCIUM: 8.4 mg/dL (ref 8.4–10.5)
CO2: 27 mmol/L (ref 19–32)
CREATININE: 6.69 mg/dL — AB (ref 0.50–1.35)
Chloride: 99 mEq/L (ref 96–112)
GFR calc Af Amer: 8 mL/min — ABNORMAL LOW (ref >90)
GFR calc non Af Amer: 7 mL/min — ABNORMAL LOW (ref >90)
GLUCOSE: 98 mg/dL (ref 70–99)
Potassium: 5.8 mmol/L — ABNORMAL HIGH (ref 3.5–5.1)
Sodium: 136 mmol/L (ref 135–145)

## 2014-11-18 LAB — CBC
HEMATOCRIT: 25.4 % — AB (ref 39.0–52.0)
Hemoglobin: 8.2 g/dL — ABNORMAL LOW (ref 13.0–17.0)
MCH: 34.2 pg — ABNORMAL HIGH (ref 26.0–34.0)
MCHC: 32.3 g/dL (ref 30.0–36.0)
MCV: 105.8 fL — AB (ref 78.0–100.0)
Platelets: 77 10*3/uL — ABNORMAL LOW (ref 150–400)
RBC: 2.4 MIL/uL — ABNORMAL LOW (ref 4.22–5.81)
RDW: 23.1 % — AB (ref 11.5–15.5)
WBC: 7.9 10*3/uL (ref 4.0–10.5)

## 2014-11-18 LAB — I-STAT TROPONIN, ED: Troponin i, poc: 0.09 ng/mL (ref 0.00–0.08)

## 2014-11-18 LAB — TROPONIN I
TROPONIN I: 0.14 ng/mL — AB (ref <0.031)
Troponin I: 0.14 ng/mL — ABNORMAL HIGH (ref <0.031)

## 2014-11-18 LAB — MRSA PCR SCREENING: MRSA by PCR: NEGATIVE

## 2014-11-18 LAB — OCCULT BLOOD X 1 CARD TO LAB, STOOL: Fecal Occult Bld: NEGATIVE

## 2014-11-18 SURGERY — LEFT HEART CATHETERIZATION WITH CORONARY ANGIOGRAM
Anesthesia: LOCAL

## 2014-11-18 MED ORDER — POLYETHYLENE GLYCOL 3350 17 G PO PACK
17.0000 g | PACK | Freq: Every day | ORAL | Status: DC
Start: 2014-11-19 — End: 2014-11-19

## 2014-11-18 MED ORDER — SODIUM CHLORIDE 0.9 % IV SOLN: Freq: Once | INTRAVENOUS | Status: DC

## 2014-11-18 MED ORDER — DIALYVITE PO TABS: 1.0000 | ORAL_TABLET | Freq: Every day | ORAL | Status: DC

## 2014-11-18 MED ORDER — SODIUM POLYSTYRENE SULFONATE 15 GM/60ML PO SUSP
30.0000 g | Freq: Once | ORAL | Status: AC
  Administered 2014-11-18: 30 g via ORAL
  Filled 2014-11-18: qty 120

## 2014-11-18 MED ORDER — SEVELAMER CARBONATE 800 MG PO TABS
1600.0000 mg | ORAL_TABLET | ORAL | Status: DC | PRN
  Administered 2014-11-19: 800 mg via ORAL
  Filled 2014-11-18: qty 2

## 2014-11-18 MED ORDER — DOCUSATE SODIUM 100 MG PO CAPS: 100.0000 mg | ORAL_CAPSULE | Freq: Every day | ORAL | Status: DC | PRN

## 2014-11-18 MED ORDER — GABAPENTIN 100 MG PO CAPS: 100.0000 mg | ORAL_CAPSULE | Freq: Two times a day (BID) | ORAL | Status: DC

## 2014-11-18 MED ORDER — ALLOPURINOL 100 MG PO TABS
100.0000 mg | ORAL_TABLET | Freq: Two times a day (BID) | ORAL | Status: DC
  Administered 2014-11-18 – 2014-11-19 (×2): 100 mg via ORAL
  Filled 2014-11-18 (×2): qty 1

## 2014-11-18 MED ORDER — GABAPENTIN 100 MG PO CAPS
200.0000 mg | ORAL_CAPSULE | Freq: Every day | ORAL | Status: DC
  Administered 2014-11-18: 200 mg via ORAL
  Filled 2014-11-18: qty 2

## 2014-11-18 MED ORDER — SEVELAMER CARBONATE 800 MG PO TABS: 1600.0000 mg | ORAL_TABLET | ORAL | Status: DC

## 2014-11-18 MED ORDER — COLCHICINE 0.6 MG PO TABS: 0.6000 mg | ORAL_TABLET | Freq: Two times a day (BID) | ORAL | Status: DC | PRN

## 2014-11-18 MED ORDER — POLYETHYLENE GLYCOL 3350 17 GM/SCOOP PO POWD: 17.0000 g | Freq: Every day | ORAL | Status: DC

## 2014-11-18 MED ORDER — ONDANSETRON HCL 4 MG/2ML IJ SOLN: 4.0000 mg | Freq: Four times a day (QID) | INTRAMUSCULAR | Status: DC | PRN

## 2014-11-18 MED ORDER — MIDODRINE HCL 5 MG PO TABS: 10.0000 mg | ORAL_TABLET | ORAL | Status: DC

## 2014-11-18 MED ORDER — RENA-VITE PO TABS
1.0000 | ORAL_TABLET | Freq: Every day | ORAL | Status: DC
  Administered 2014-11-18: 1 via ORAL
  Filled 2014-11-18: qty 1

## 2014-11-18 MED ORDER — GI COCKTAIL ~~LOC~~: 30.0000 mL | Freq: Four times a day (QID) | ORAL | Status: DC | PRN

## 2014-11-18 MED ORDER — GABAPENTIN 100 MG PO CAPS
100.0000 mg | ORAL_CAPSULE | Freq: Every day | ORAL | Status: DC
  Administered 2014-11-19: 100 mg via ORAL
  Filled 2014-11-18: qty 1

## 2014-11-18 MED ORDER — CINACALCET HCL 30 MG PO TABS
30.0000 mg | ORAL_TABLET | Freq: Every day | ORAL | Status: DC
  Filled 2014-11-18 (×2): qty 1

## 2014-11-18 MED ORDER — SEVELAMER CARBONATE 800 MG PO TABS
2400.0000 mg | ORAL_TABLET | Freq: Three times a day (TID) | ORAL | Status: DC
  Administered 2014-11-18 – 2014-11-19 (×2): 2400 mg via ORAL
  Filled 2014-11-18 (×2): qty 3

## 2014-11-18 MED ORDER — OXYCODONE-ACETAMINOPHEN 5-325 MG PO TABS: 1.0000 | ORAL_TABLET | Freq: Four times a day (QID) | ORAL | Status: DC | PRN

## 2014-11-18 MED ORDER — MIDODRINE HCL 5 MG PO TABS
20.0000 mg | ORAL_TABLET | ORAL | Status: DC
  Administered 2014-11-19: 20 mg via ORAL
  Filled 2014-11-18: qty 4

## 2014-11-18 MED ORDER — CALCIUM CARBONATE ANTACID 500 MG PO CHEW: 1.0000 | CHEWABLE_TABLET | Freq: Every day | ORAL | Status: DC | PRN

## 2014-11-18 MED ORDER — ACETAMINOPHEN 325 MG PO TABS: 650.0000 mg | ORAL_TABLET | ORAL | Status: DC | PRN

## 2014-11-18 MED ORDER — MIDODRINE HCL 5 MG PO TABS: 20.0000 mg | ORAL_TABLET | ORAL | Status: DC

## 2014-11-18 MED ORDER — MORPHINE SULFATE 2 MG/ML IJ SOLN: 2.0000 mg | INTRAMUSCULAR | Status: DC | PRN

## 2014-11-18 NOTE — ED Notes (Signed)
[facility]- Pt was admitted 2 weeks ago after a cardiac cath which was clean. He had shortness of breath this morning and some chest pain. He took 324 mg of aspirin, he had 2 nitro tablets and 5mg  of albuterol en route. Vital signs stable. Pt denying CP at this time. O2 saturation 100%.

## 2014-11-18 NOTE — ED Provider Notes (Addendum)
CSN: [id number]     Arrival date & time 11/18/14  1137 History   First MD Initiated Contact with Patient 11/18/14 1155     Chief Complaint  Patient presents with  . Chest Pain     (Consider location/radiation/quality/duration/timing/severity/associated sxs/prior Treatment) HPI Comments: Patient history of end-stage renal disease on dialysis Monday Wednesday Friday, CHF, chronic anemia, history of myelodysplastic syndrome presents with chest pain. He came in as a STEMI activation. However Dr. Irish Lack reviewed the EKGs and did not feel the patient met criteria to go straight to the Cath Lab. Patient presents with an episode of chest pain that started about 9:00 this morning. He's had a prior episode earlier about 6:00 that went away. This episode was across his chest with associated shortness of breath. Lasted about an hour. He took 4 baby aspirin at home and was given 2 nitroglycerin by EMS. He is currently pain-free. He denies any increase in pedal edema. He states it feels similar to his past anginal episodes. It is not brought on by exertion. He's had no cough congestion or fevers. He is due for dialysis today. His last dialysis was 2 days ago.  Patient is a 80 y.o. male presenting with chest pain.  Chest Pain Associated symptoms: shortness of breath   Associated symptoms: no abdominal pain, no back pain, no cough, no diaphoresis, no dizziness, no fatigue, no fever, no headache, no nausea, no numbness, not vomiting and no weakness     Past Medical History  Diagnosis Date  . ESRD on hemodialysis     Hemodialysis since 2003; Occluded access in both upper extremities and the right lower extremity, current access as of Aug 2014 is L thigh AVG. Gets HD MWF at [facility].     . Duodenal ulcer     remote  . Chronic combined systolic and diastolic CHF (congestive heart failure)     a. 02/2012 Echo: EF 65%, basal infolat AK, mild conc LVH;  b. 06/2013 Echo: EF 40-45%, mild LVH, Gr2 DD,  basal inf HK->AK, Mod AS, Mild MR, PASP 73;  c. 09/2014 Echo: EF 45% basal-mid inflat and inf AK, Gr 2DD, mod AS.  Marland Kitchen Arteriosclerotic cardiovascular disease (ASCVD) 1998    a. [id number]; b. 06/2003 Cath: 4/4 patent grafts, EF 55%;  c. 09/2014 Cath: LM 99d, LADnl, LCX nl, RCA 100p, LIMA->LAD nl, VG->OM2->OM3 nl, VG->RPL nl, EF 40-45%.  . Cerebrovascular disease   . Neuropathy of foot   . Degenerative joint disease   . Gastroesophageal reflux disease   . Impaired hearing     Bilateral; hearing aids relatively ineffective  . Hypertension   . Cholelithiasis 03/2011    Diagnosed incidentally on abdominal ultrasound in 03/2011  . Hepatic steatosis   . Macrocytosis 10/20/2012  . Thrombocytopenia 10/20/2012  . Hyperlipidemia   . Secondary hyperparathyroidism   . Peripheral vascular disease   . Gout   . Moderate aortic stenosis     a. 06/2013 Echo: Mod AS, mean grad: 25, peak grad: 57;  b. 09/2014 Echo: EF 45%, Gr 2 DD, Mod AS, valve area 1.15cm^2 (VTI), 1.1cm^2 (Vmax).  . Pulmonary hypertension     a. 06/2013 Echo: PASP 44mmHg.  Marland Kitchen Anemia     Prior blood transfusion  . Complication of anesthesia     daughter reports long episodes confusion after amnesia meds  . Renal cell carcinoma 2001    s/p right nephrectomy-2001; subsequent ESRD  . 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 Hypotension   .  Pancytopenia   . MRSA infection     a. 08/2014->10/2014 L thigh dialysis graft.  . Infection of AV graft for dialysis     a. Recurrent L thigh infxn s/p revision 10/1, I & D 11/28, and removal 11/01/2014.   Past Surgical History  Procedure Laterality Date  . Right nephrectomy  2001    Renal cell carcinoma  . Colonoscopy  01/24/2011    prominent vascular pattern, suboptimal prep but doable  . Esophagogastroduodenoscopy  01/24/2011    mild erosive reflux esophagitis, bulbar/antral erosions, bx from antrum benign  . Av fistula repair  2008    revision of anastomosis of Right AVF   . Arteriovenous graft placement  2006    Thrombectomy and interposition jump graft revision to higher axillary vein of LUA AVG  . Coronary artery bypass graft  1998    X4 VESSELS  . Multiple cysto/ resection tumor's with bx's  LAST ONE 05-09-2011  . Multiple surg's / interventions for avgg/ fistula right upper arm  LAST REVISION 06-29-2011    (JUNE [address] [place] VEIN/ [id number] DILATATION ANGIOPLASTY)  . Left ptc left renal artery    . Cardiac catheterization  2004  . Cataract extraction w/ intraocular lens  implant, bilateral    . Cystoscopy w/ retrogrades  12/28/2011    Procedure: CYSTOSCOPY WITH RETROGRADE PYELOGRAM;  Surgeon: Malka So, MD;  Location: [facility];  Service: Urology;  Laterality: Left;  C-ARM   . Transurethral resection of bladder tumor  12/28/2011    Procedure: TRANSURETHRAL RESECTION OF BLADDER TUMOR (TURBT);  Surgeon: Malka So, MD;  Location: [facility];  Service: Urology;  Laterality: N/A;  . Av fistula placement  02/29/2012    Procedure: INSERTION OF ARTERIOVENOUS (AV) GORE-TEX GRAFT THIGH;  Surgeon: Rosetta Posner, MD;  Location: [facility];  Service: Vascular;  Laterality: Right;  Insertion right femoral arteriovenous gortex graft  . Ligation of right braciocephalic av fistula  22-29-7989  . Insertion of dialysis catheter  06/05/2012    Procedure: INSERTION OF DIALYSIS CATHETER;  Surgeon: Mal Misty, MD;  Location: [facility];  Service: Vascular;  Laterality: Left;  Insertion diatek catheter left IJ  . Insertion of dialysis catheter  08/22/2012    Procedure: INSERTION OF DIALYSIS CATHETER;  Surgeon: Serafina Mitchell, MD;  Location: [facility];  Service: Vascular;  Laterality: Left;  insertion of dialysis catheter left  internal jugular 27cm  . Av fistula placement  10/08/2012    Procedure: INSERTION OF ARTERIOVENOUS (AV) GORE-TEX GRAFT THIGH;  Surgeon: Angelia Mould, MD;  Location: [facility];  Service: Vascular;  Laterality:  Left;  . Cystoscopy with biopsy N/A 08/07/2013    Procedure: CYSTOSCOPY WITH BLADDER BIOPSY;  Surgeon: Malka So, MD;  Location: [facility];  Service: Urology;  Laterality: N/A;  . Fulguration of bladder tumor N/A 08/07/2013    Procedure: FULGURATION OF BLADDER TUMOR;  Surgeon: Malka So, MD;  Location: [facility];  Service: Urology;  Laterality: N/A;  . Revision of arteriovenous goretex graft Left 08/27/2014    Procedure: THROMBECTOMY & REVISION OF LEFT ARM  ARTERIOVENOUS GORETEX GRAFT;  Surgeon: Angelia Mould, MD;  Location: [facility];  Service: Vascular;  Laterality: Left;  . Revision of arteriovenous goretex graft Left 10/01/2014    Procedure: SEGMENT OF Left THIGH ARTERIOVENOUS GORETEX GRAFT Removed;  Surgeon: Mal Misty, MD;  Location: [facility];  Service: Vascular;  Laterality: Left;  . Removal of graft  Left 11/01/2014    Procedure: REMOVAL OF INFECTED LEFT THIGH GRAFT;  Surgeon: Nada Libman, MD;  Location: [facility];  Service: Vascular;  Laterality: Left;  removal of infected left dialysis graft.  . Left heart catheterization with coronary/graft angiogram N/A 10/27/2014    Procedure: LEFT HEART CATHETERIZATION WITH Isabel Caprice;  Surgeon: Marykay Lex, MD;  Location: [facility] CATH LAB;  Service: Cardiovascular;  Laterality: N/A;   Family History  Problem Relation Age of Onset  . Colon cancer Neg Hx   . Liver disease Neg Hx   . Anesthesia problems Neg Hx   . Heart disease Father   . Other Mother     died @ 62 with respiratory issues following hip fx  . Diabetes Mother    History  Substance Use Topics  . Smoking status: Former Smoker -- 1.00 packs/day for 25 years    Types: Cigarettes    Quit date: 02/26/1984  . Smokeless tobacco: Former Neurosurgeon     Comment: quit 1985  . Alcohol Use: 0.0 oz/week    0 Not specified per week     Comment: Rarely    Review of Systems  Constitutional: Negative for fever, chills, diaphoresis and fatigue.  HENT: Negative for congestion,  rhinorrhea and sneezing.   Eyes: Negative.   Respiratory: Positive for shortness of breath. Negative for cough and chest tightness.   Cardiovascular: Positive for chest pain. Negative for leg swelling.  Gastrointestinal: Negative for nausea, vomiting, abdominal pain, diarrhea and blood in stool.  Genitourinary: Negative for frequency, hematuria, flank pain and difficulty urinating.  Musculoskeletal: Negative for back pain and arthralgias.  Skin: Negative for rash.  Neurological: Negative for dizziness, speech difficulty, weakness, numbness and headaches.      Allergies  Review of patient's allergies indicates no known allergies.  Home Medications   Prior to Admission medications   Medication Sig Start Date End Date Taking? Authorizing Provider  acetaminophen (TYLENOL) 500 MG tablet Take 500 mg by mouth every 6 (six) hours as needed. For pain/headache    Historical Provider, MD  albuterol (PROVENTIL HFA;VENTOLIN HFA) 108 (90 BASE) MCG/ACT inhaler Inhale 2 puffs into the lungs every 6 (six) hours as needed for wheezing or shortness of breath. 06/23/13   Jodelle Gross, NP  allopurinol (ZYLOPRIM) 100 MG tablet Take 100 mg by mouth 2 (two) times daily.  09/19/11   Rhetta Mura, MD  aspirin EC 81 MG tablet Take 81 mg by mouth at bedtime.     Historical Provider, MD  B Complex-C-Folic Acid (DIALYVITE TABLET) TABS Take 1 tablet by mouth at bedtime.  05/24/12   Historical Provider, MD  calcium carbonate (TUMS - DOSED IN MG ELEMENTAL CALCIUM) 500 MG chewable tablet Chew 1 tablet by mouth daily as needed for heartburn. For heartburn    Historical Provider, MD  cinacalcet (SENSIPAR) 30 MG tablet Take 30 mg by mouth daily with breakfast.     Historical Provider, MD  colchicine 0.6 MG tablet Take 0.6 mg by mouth 2 (two) times daily as needed (for gout).    Historical Provider, MD  docusate sodium (COLACE) 100 MG capsule Take 100 mg by mouth daily as needed for mild constipation.      Historical Provider, MD  econazole nitrate 1 % cream Apply 1 application topically daily as needed (Athletes foot).    Historical Provider, MD  Emollient (LUBRIDERM SERIOUSLY SENSITIVE) LOTN Apply 1 application topically daily.    Historical Provider, MD  gabapentin (NEURONTIN) 100 MG capsule  Take 100-200 mg by mouth 2 (two) times daily. Takes 1 in the morning and 2 at night    Historical Provider, MD  midodrine (PROAMATINE) 10 MG tablet Take 10-20 mg by mouth See admin instructions. On Dialysis days (M,W,F), patient takes 2 tabs in the morning and 1 in the evening and on the other days patient takes 1 tablet in the morning and 2 in teh evening    Historical Provider, MD  oxyCODONE-acetaminophen (PERCOCET/ROXICET) 5-325 MG per tablet Take one tablet by mouth every 6 hours as needed for pain 11/05/14   Lauree Chandler, NP  polyethylene glycol powder (MIRALAX) powder Take 17 g by mouth at bedtime.     Historical Provider, MD  Pramoxine-Menthol (GOLD BOND MAXIMUM RELIEF EX) Apply 1 application topically daily as needed (Itching).    Historical Provider, MD  sevelamer (RENVELA) 800 MG tablet Take 1,600-2,400 mg by mouth See admin instructions. Take 3 tabs with each meals and 2 tabs with snacks    Historical Provider, MD  Tbo-Filgrastim (GRANIX) 480 MCG/0.8ML SOSY injection Inject 480 mcg into the skin once a week. Tuesdays    Historical Provider, MD   SpO2 99% Physical Exam  Constitutional: He is oriented to person, place, and time. He appears well-developed and well-nourished.  HENT:  Head: Normocephalic and atraumatic.  Eyes: Pupils are equal, round, and reactive to light.  Neck: Normal range of motion. Neck supple.  Cardiovascular: Normal rate, regular rhythm and normal heart sounds.   Pulmonary/Chest: Effort normal and breath sounds normal. No respiratory distress. He has no wheezes. He has no rales. He exhibits no tenderness.  Abdominal: Soft. Bowel sounds are normal. There is no tenderness.  There is no rebound and no guarding.  Musculoskeletal: Normal range of motion. He exhibits no edema.  Lymphadenopathy:    He has no cervical adenopathy.  Neurological: He is alert and oriented to person, place, and time.  Skin: Skin is warm and dry. No rash noted.  Psychiatric: He has a normal mood and affect.    ED Course  Procedures (including critical care time) Labs Review Labs Reviewed  BASIC METABOLIC PANEL - Abnormal; Notable for the following:    Potassium 5.8 (*)    BUN 44 (*)    Creatinine, Ser 6.69 (*)    GFR calc non Af Amer 7 (*)    GFR calc Af Amer 8 (*)    All other components within normal limits  CBC - Abnormal; Notable for the following:    RBC 2.40 (*)    Hemoglobin 8.2 (*)    HCT 25.4 (*)    MCV 105.8 (*)    MCH 34.2 (*)    RDW 23.1 (*)    All other components within normal limits  I-STAT TROPOININ, ED - Abnormal; Notable for the following:    Troponin i, poc 0.09 (*)    All other components within normal limits    Imaging Review Dg Chest Port 1 View  11/18/2014   CLINICAL DATA:  Chest pain, tightness  EXAM: PORTABLE CHEST - 1 VIEW  COMPARISON:  11/07/2014  FINDINGS: There are bilateral interstitial and alveolar airspace opacities. There is no significant pleural effusion or pneumothorax. There is mild stable cardiomegaly. There is evidence of prior CABG. There is no acute osseous abnormality.  IMPRESSION: Overall appearance concerning for mild pulmonary edema.   Electronically Signed   By: Kathreen Devoid   On: 11/18/2014 12:39     EKG Interpretation   Date/Time:  Wednesday November 18 2014 11:49:35 EST Ventricular Rate:  104 PR Interval:  182 QRS Duration: 87 QT Interval:  347 QTC Calculation: 456 R Axis:   62 Text Interpretation:  Sinus tachycardia Nonspecific T abnormalities,  lateral leads slightly increased ST depression lateral leads as compared  to prior Confirmed by Avari Nevares  MD, Tashea Othman ([id number]) on 11/18/2014 3:03:37 PM     Prehospital EKG  shows more pronounced ST depression laterally.   MDM   Final diagnoses:  Unstable angina  ESRD (end stage renal disease)    Spoke with Dr. Tana Coast with [facility] who will admit pt.  Pt has been seen by Dr Gwenlyn Found with cardiology who recommends serial troponins.  Pt will need dialysis.  I have a page out to nephrology.  Pt also may need to get his unit of blood while he is in the hospital the was scheduled for tomorrow.  15:53 spoke with Dr. Justin Mend with nephrology who will plan on dialysis tomorrow.  Advised to give kayexelate 30g for hyperkalemia  Malvin Johns, MD 11/18/14 [place], MD 11/18/14 [id number]

## 2014-11-18 NOTE — ED Notes (Signed)
MD at bedside. 

## 2014-11-18 NOTE — H&P (Signed)
History and Physical       Hospital Admission Note Date: 11/18/2014  Patient name: Andrew [name] Medical record number: [id number] Date of birth: [birth year] Age: 80 y.o. Gender: male  PCP: [name]., MD    Chief Complaint:  Chest pain  HPI: Patient is 81 year old male with ESRD on hemodialysis, MWF, myelodysplastic syndrome, infected left high AVG s/p removal (follows Dr. Trula Slade) presented to ED with chest pain. History was provided by patient and his daughter present in the room. Patient reported that he was getting ready in the morning to go to his regular dialysis center, was sitting at edge of the bed when he started having chest pain around 9 AM. He had a prior episode that had resolved on its own around 6 AM. Patient reported the chest pain as midsternal, associated with shortness of breath, nausea but no diaphoresis or radiation of the chest pain. The chest pain lasted about an hour and patient took 4 baby aspirin and 2 nitroglycerin via EMS. Daughter reported that he was wheezing at the time. Patient is also due for dialysis today. At the time of my encounter chest pain has completely resolved. Patient also also reported that he usually gets short of breath and chest tightness when he is due for his transfusion. He was considered code STEMI initially and was seen by cardiology. Patient had a recent cardiac cath, EF 40-45%, with patent grafts. Cardiology recommended admission to medicine for observation, rule out ACS.   Review of Systems:  Constitutional: Denies fever, chills, diaphoresis, poor appetite and +fatigue.  HEENT: Denies photophobia, eye pain, redness, hearing loss, ear pain, congestion, sore throat, rhinorrhea, sneezing, mouth sores, trouble swallowing, neck pain, neck stiffness and tinnitus.   Respiratory: Please see history of present illness  Cardiovascular: Please see history of present illness   Gastrointestinal: Denies nausea, vomiting, abdominal pain, diarrhea, constipation, blood in stool and abdominal distention.  Genitourinary: Denies dysuria, urgency, frequency, hematuria, flank pain and difficulty urinating.  Musculoskeletal: Denies myalgias, back pain, joint swelling, arthralgias and gait problem.  Skin: Denies pallor, rash and wound.  Neurological: Denies dizziness, seizures, syncope, weakness, light-headedness, numbness and headaches.  Hematological: Denies adenopathy. Easy bruising, personal or family bleeding history  Psychiatric/Behavioral: Denies suicidal ideation, mood changes, confusion, nervousness, sleep disturbance and agitation  Past Medical History: Past Medical History  Diagnosis Date  . ESRD on hemodialysis     Hemodialysis since 2003; Occluded access in both upper extremities and the right lower extremity, current access as of Aug 2014 is L thigh AVG. Gets HD MWF at [facility].     . Duodenal ulcer     remote  . Chronic combined systolic and diastolic CHF (congestive heart failure)     a. 02/2012 Echo: EF 65%, basal infolat AK, mild conc LVH;  b. 06/2013 Echo: EF 40-45%, mild LVH, Gr2 DD, basal inf HK->AK, Mod AS, Mild MR, PASP 73;  c. 09/2014 Echo: EF 45% basal-mid inflat and inf AK, Gr 2DD, mod AS.  Marland Kitchen Arteriosclerotic cardiovascular disease (ASCVD) 1998    a. [id number]; b. 06/2003 Cath: 4/4 patent grafts, EF 55%;  c. 09/2014 Cath: LM 99d, LADnl, LCX nl, RCA 100p, LIMA->LAD nl, VG->OM2->OM3 nl, VG->RPL nl, EF 40-45%.  . Cerebrovascular disease   . Neuropathy of foot   . Degenerative joint disease   . Gastroesophageal reflux disease   . Impaired hearing     Bilateral; hearing aids relatively ineffective  . Hypertension   . Cholelithiasis 03/2011  Diagnosed incidentally on abdominal ultrasound in 03/2011  . Hepatic steatosis   . Macrocytosis 10/20/2012  . Thrombocytopenia 10/20/2012  . Hyperlipidemia   . Secondary hyperparathyroidism   .  Peripheral vascular disease   . Gout   . Moderate aortic stenosis     a. 06/2013 Echo: Mod AS, mean grad: 25, peak grad: 57;  b. 09/2014 Echo: EF 45%, Gr 2 DD, Mod AS, valve area 1.15cm^2 (VTI), 1.1cm^2 (Vmax).  . Pulmonary hypertension     a. 06/2013 Echo: PASP 7mmHg.  Marland Kitchen Anemia     Prior blood transfusion  . Complication of anesthesia     daughter reports long episodes confusion after amnesia meds  . Renal cell carcinoma 2001    s/p right nephrectomy-2001; subsequent ESRD  . 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 Hypotension   . Pancytopenia   . MRSA infection     a. 08/2014->10/2014 L thigh dialysis graft.  . Infection of AV graft for dialysis     a. Recurrent L thigh infxn s/p revision 10/1, I & D 11/28, and removal 11/01/2014.   Past Surgical History  Procedure Laterality Date  . Right nephrectomy  2001    Renal cell carcinoma  . Colonoscopy  01/24/2011    prominent vascular pattern, suboptimal prep but doable  . Esophagogastroduodenoscopy  01/24/2011    mild erosive reflux esophagitis, bulbar/antral erosions, bx from antrum benign  . Av fistula repair  2008    revision of anastomosis of Right AVF  . Arteriovenous graft placement  2006    Thrombectomy and interposition jump graft revision to higher axillary vein of LUA AVG  . Coronary artery bypass graft  1998    X4 VESSELS  . Multiple cysto/ resection tumor's with bx's  LAST ONE 05-09-2011  . Multiple surg's / interventions for avgg/ fistula right upper arm  LAST REVISION 06-29-2011    (JUNE [id number] [place] VEIN/ [id number] DILATATION ANGIOPLASTY)  . Left ptc left renal artery    . Cardiac catheterization  2004  . Cataract extraction w/ intraocular lens  implant, bilateral    . Cystoscopy w/ retrogrades  12/28/2011    Procedure: CYSTOSCOPY WITH RETROGRADE PYELOGRAM;  Surgeon: Malka So, MD;  Location: [facility];  Service: Urology;  Laterality: Left;  C-ARM   . Transurethral  resection of bladder tumor  12/28/2011    Procedure: TRANSURETHRAL RESECTION OF BLADDER TUMOR (TURBT);  Surgeon: Malka So, MD;  Location: [facility];  Service: Urology;  Laterality: N/A;  . Av fistula placement  02/29/2012    Procedure: INSERTION OF ARTERIOVENOUS (AV) GORE-TEX GRAFT THIGH;  Surgeon: Rosetta Posner, MD;  Location: [place];  Service: Vascular;  Laterality: Right;  Insertion right femoral arteriovenous gortex graft  . Ligation of right braciocephalic av fistula  [id number]  . Insertion of dialysis catheter  06/05/2012    Procedure: INSERTION OF DIALYSIS CATHETER;  Surgeon: Mal Misty, MD;  Location: [place];  Service: Vascular;  Laterality: Left;  Insertion diatek catheter left IJ  . Insertion of dialysis catheter  08/22/2012    Procedure: INSERTION OF DIALYSIS CATHETER;  Surgeon: Serafina Mitchell, MD;  Location: [facility];  Service: Vascular;  Laterality: Left;  insertion of dialysis catheter left  internal jugular 27cm  . Av fistula placement  10/08/2012    Procedure: INSERTION OF ARTERIOVENOUS (AV) GORE-TEX GRAFT THIGH;  Surgeon: Angelia Mould, MD;  Location: [place];  Service: Vascular;  Laterality:  Left;  . Cystoscopy with biopsy N/A 08/07/2013    Procedure: CYSTOSCOPY WITH BLADDER BIOPSY;  Surgeon: Malka So, MD;  Location: [facility];  Service: Urology;  Laterality: N/A;  . Fulguration of bladder tumor N/A 08/07/2013    Procedure: FULGURATION OF BLADDER TUMOR;  Surgeon: Malka So, MD;  Location: [facility];  Service: Urology;  Laterality: N/A;  . Revision of arteriovenous goretex graft Left 08/27/2014    Procedure: THROMBECTOMY & REVISION OF LEFT ARM  ARTERIOVENOUS GORETEX GRAFT;  Surgeon: Angelia Mould, MD;  Location: [place];  Service: Vascular;  Laterality: Left;  . Revision of arteriovenous goretex graft Left 10/01/2014    Procedure: SEGMENT OF Left THIGH ARTERIOVENOUS GORETEX GRAFT Removed;  Surgeon: Mal Misty, MD;  Location: [place];  Service:  Vascular;  Laterality: Left;  . Removal of graft Left 11/01/2014    Procedure: REMOVAL OF INFECTED LEFT THIGH GRAFT;  Surgeon: Serafina Mitchell, MD;  Location: [facility];  Service: Vascular;  Laterality: Left;  removal of infected left dialysis graft.  . Left heart catheterization with coronary/graft angiogram N/A 10/27/2014    Procedure: LEFT HEART CATHETERIZATION WITH Beatrix Fetters;  Surgeon: Leonie Man, MD;  Location[facility] C[facility];  Service: Cardiovascular;  Laterality: N/A;    Medications: Prior to Admission medications   Medication Sig Start Date End Date Taking? Authorizing Provider  acetaminophen (TYLENOL) 500 MG tablet Take 500 mg by mouth every 6 (six) hours as needed. For pain/headache    Historical Provider, MD  albuterol (PROVENTIL HFA;VENTOLIN HFA) 108 (90 BASE) MCG/ACT inhaler Inhale 2 puffs into the lungs every 6 (six) hours as needed for wheezing or shortness of breath. 06/23/13   Lendon Colonel, NP  allopurinol (ZYLOPRIM) 100 MG tablet Take 100 mg by mouth 2 (two) times daily.  09/19/11   [name], MD  aspirin EC 81 MG tablet Take 81 mg by mouth at bedtime.     Historical Provider, MD  B Complex-C-Folic Acid (DIALYVITE TABLET) TABS Take 1 tablet by mouth at bedtime.  05/24/12   Historical Provider, MD  calcium carbonate (TUMS - DOSED IN MG ELEMENTAL CALCIUM) 500 MG chewable tablet Chew 1 tablet by mouth daily as needed for heartburn. For heartburn    Historical Provider, MD  cinacalcet (SENSIPAR) 30 MG tablet Take 30 mg by mouth daily with breakfast.     Historical Provider, MD  colchicine 0.6 MG tablet Take 0.6 mg by mouth 2 (two) times daily as needed (for gout).    Historical Provider, MD  docusate sodium (COLACE) 100 MG capsule Take 100 mg by mouth daily as needed for mild constipation.     Historical Provider, MD  econazole nitrate 1 % cream Apply 1 application topically daily as needed (Athletes foot).    Historical Provider, MD  Emollient (LUBRIDERM  SERIOUSLY SENSITIVE) LOTN Apply 1 application topically daily.    Historical Provider, MD  gabapentin (NEURONTIN) 100 MG capsule Take 100-200 mg by mouth 2 (two) times daily. Takes 1 in the morning and 2 at night    Historical Provider, MD  midodrine (PROAMATINE) 10 MG tablet Take 10-20 mg by mouth See admin instructions. On Dialysis days (M,W,F), patient takes 2 tabs in the morning and 1 in the evening and on the other days patient takes 1 tablet in the morning and 2 in teh evening    Historical Provider, MD  oxyCODONE-acetaminophen (PERCOCET/ROXICET) 5-325 MG per tablet Take one tablet by mouth every 6 hours as  needed for pain 11/05/14   Lauree Chandler, NP  polyethylene glycol powder (MIRALAX) powder Take 17 g by mouth at bedtime.     Historical Provider, MD  Pramoxine-Menthol (GOLD BOND MAXIMUM RELIEF EX) Apply 1 application topically daily as needed (Itching).    Historical Provider, MD  sevelamer (RENVELA) 800 MG tablet Take 1,600-2,400 mg by mouth See admin instructions. Take 3 tabs with each meals and 2 tabs with snacks    Historical Provider, MD  Tbo-Filgrastim (GRANIX) 480 MCG/0.8ML SOSY injection Inject 480 mcg into the skin once a week. Tuesdays    Historical Provider, MD    Allergies:  No Known Allergies  Social History:  reports that he quit smoking about 30 years ago. His smoking use included Cigarettes. He has a 25 pack-year smoking history. He has quit using smokeless tobacco. He reports that he drinks alcohol. He reports that he does not use illicit drugs.  Family History: Family History  Problem Relation Age of Onset  . Colon cancer Neg Hx   . Liver disease Neg Hx   . Anesthesia problems Neg Hx   . Heart disease Father   . Other Mother     died @ 19 with respiratory issues following hip fx  . Diabetes Mother     Physical Exam: Blood pressure 136/57, pulse 86, resp. rate 21, SpO2 96 %. General: Alert, awake, oriented x3, in no acute distress. HEENT: normocephalic,  atraumatic, anicteric sclera, pink conjunctiva, pupils equal and reactive to light and accomodation, oropharynx clear Neck: supple, no masses or lymphadenopathy, no goiter, no bruits  Heart: Regular rate and rhythm, 3/6 SEM Lungs: Clear to auscultation bilaterally, no wheezing, rales or rhonchi. Abdomen: Soft, nontender, nondistended, positive bowel sounds, no masses. Extremities: No clubbing, cyanosis or edema with positive pedal pulses. Dressing intact on the left thigh, clean Neuro: Grossly intact, no focal neurological deficits, strength 5/5 upper and lower extremities bilaterally Psych: alert and oriented x 3, normal mood and affect Skin: no rashes or lesions, warm and dry   LABS on Admission:  Basic Metabolic Panel:  Recent Labs Lab 11/18/14 1158  NA 136  K 5.8*  CL 99  CO2 27  GLUCOSE 98  BUN 44*  CREATININE 6.69*  CALCIUM 8.4   Liver Function Tests: No results for input(s): AST, ALT, ALKPHOS, BILITOT, PROT, ALBUMIN in the last 168 hours. No results for input(s): LIPASE, AMYLASE in the last 168 hours. No results for input(s): AMMONIA in the last 168 hours. CBC:  Recent Labs Lab 11/17/14 1024 11/18/14 1158  WBC 1.5* 7.9  NEUTROABS 0.5*  --   HGB 8.1* 8.2*  HCT 25.8* 25.4*  MCV 102.8* 105.8*  PLT 80* 77*   Cardiac Enzymes: No results for input(s): CKTOTAL, CKMB, CKMBINDEX, TROPONINI in the last 168 hours. BNP: Invalid input(s): POCBNP CBG: No results for input(s): GLUCAP in the last 168 hours.   Radiological Exams on Admission: Dg Chest 2 View  11/07/2014   CLINICAL DATA:  80 year old male with cough and positive PPD the  EXAM: CHEST  2 VIEW  COMPARISON:  Prior chest x-ray 10/29/2014  FINDINGS: Stable mild cardiomegaly. Patient is status post median sternotomy with evidence of multivessel CABG. Atherosclerotic calcification present in the transverse aorta. No focal airspace consolidation, pulmonary edema, pleural effusion or pneumothorax. Stable bronchitic  change and a mild prominence of the interstitial markings. Similar configuration of right axillary vein flare stent graft with possible fracture versus infolding inferiorly. No acute osseous abnormality.  IMPRESSION: Stable  chest x-ray without evidence of acute cardiopulmonary process.   Electronically Signed   By: Jacqulynn Cadet M.D.   On: 11/07/2014 09:55   Dg Chest 2 View  10/29/2014   CLINICAL DATA:  Fever 10/24/2014  EXAM: CHEST  2 VIEW  COMPARISON:  None.  FINDINGS: Interval improvement in aeration bilaterally. Resolution of previously noted infiltrate or edema on the right. Vascularity has improved and is now normal. Lungs are clear and there is no effusion. Right axillary stent with kinking unchanged.  IMPRESSION: Improved aeration bilaterally which may be due to clearing edema or pneumonia. Lungs are now clear.   Electronically Signed   By: Franchot Gallo M.D.   On: 10/29/2014 09:04   [place] Extrem Low Left Ltd  10/29/2014   CLINICAL DATA:  Patient with history of revised left thigh dialysis graft, now with focal area of erythema. Evaluate for abscess.  EXAM: ULTRASOUND LEFT LOWER EXTREMITY LIMITED  TECHNIQUE: Ultrasound examination of the lower extremity soft tissues was performed in the area of clinical concern.  COMPARISON:  None  FINDINGS: Multiple grayscale and color flow images were provided of the patient's left upper thigh at the area of concern. Additionally, real-time sonographic evaluation was performed by the dictating radiologist.  Acquired cinegraphic images demonstrate that the focal area of erythema within the patient's upper medial thigh corresponds with focal dermal erosion superficial to the abandoned medial thigh dialysis graft. There is no associated organized fluid collection or abscess. This focal area of erythema is remote from the patient's functioning left thigh dialysis graft.  The lateral aspect of the patient's functioning left upper thigh dialysis graft appears widely  patent where imaged.  IMPRESSION: 1. Focal area of erythema involving the left thigh corresponds to a focal area of dermal erosion superficial to the patient's abandoned left thigh dialysis graft. No associated organized fluid collection or abscess. 2. The lateral aspect of the patient's functioning left thigh graft appears widely patent were imaged.   Electronically Signed   By: Sandi Mariscal M.D.   On: 10/29/2014 16:25   Dg Chest Port 1 View  11/18/2014   CLINICAL DATA:  Chest pain, tightness  EXAM: PORTABLE CHEST - 1 VIEW  COMPARISON:  11/07/2014  FINDINGS: There are bilateral interstitial and alveolar airspace opacities. There is no significant pleural effusion or pneumothorax. There is mild stable cardiomegaly. There is evidence of prior CABG. There is no acute osseous abnormality.  IMPRESSION: Overall appearance concerning for mild pulmonary edema.   Electronically Signed   By: Kathreen Devoid   On: 11/18/2014 12:39   Dg Chest Port 1 View  10/24/2014   CLINICAL DATA:  Shortness of breath  EXAM: PORTABLE CHEST - 1 VIEW  COMPARISON:  10/23/2014  FINDINGS: Mild patchy opacity in the right upper and lower lobes, worrisome for multifocal pneumonia, less likely asymmetric interstitial edema. No pleural effusion or pneumothorax.  Cardiomegaly.  Postsurgical changes related to prior CABG.  Stable right axillary stent.  IMPRESSION: Mild patchy opacity in the right upper and lower lobes, worrisome for multifocal pneumonia, less likely asymmetric interstitial edema.   Electronically Signed   By: Julian Hy M.D.   On: 10/24/2014 13:50   Dg Chest Port 1 View  10/23/2014   CLINICAL DATA:  80 year old with shortness of breath and cough  EXAM: PORTABLE CHEST - 1 VIEW  COMPARISON:  04/21/2014  FINDINGS: Median sternotomy wires, surgical clips and CABG markers are present. Metallic clips project in the left axilla. A vascular stent projects  over the right shoulder.  The cardiac silhouette is enlarged. The  mediastinal contours are within normal limits. Atherosclerotic aortic calcifications are present.  Interstitial prominence is present in the lung bases, right slightly greater than left. There is no pleural effusion on the right. The left costophrenic angle is excluded from the field of view, limiting evaluation. There is no pneumothorax.  No acute osseous abnormality is identified.  IMPRESSION: 1. Mild cardiomegaly with probable mild interstitial pulmonary edema. Interstitial pneumonitis could have a similar appearance. Clinical correlation is recommended. 2. Prior CABG.   Electronically Signed   By: Fannie Knee   On: 10/23/2014 12:09   Ct Extrem Lower W Cm Bil  10/30/2014   CLINICAL DATA:  He had short segment of nonfunctional left thigh graft removed on 10/01/2014. The area where the short segment of nonfunctional graft has had some drainage which was positive for MRSA. Up till now patient has been afebrile. Apparently had one temp to 101.3-axillary last p.m.On exam today the area where the segment of graft was removed is not inflamed and no evidence of proximal or distal cellulitis. However was able to compress the proximal segment of nonfunctional graft and had some purulent drainage.  EXAM: CT OF THE LOWER BILATERAL EXTREMITY WITH CONTRAST  TECHNIQUE: Multidetector CT imaging of the was performed according to the standard protocol following intravenous contrast administration.  COMPARISON:  None.  CONTRAST:  OMNIPAQUE IOHEXOL 300 MG/ML  SOLN  FINDINGS: No acute fracture or dislocation. No lytic or blastic osseous lesion. No bone destruction. Tricompartmental osteoarthritis of the knees bilaterally. Large left knee joint effusion.  There is diverticulosis without evidence of diverticulitis. There is atherosclerotic disease involving bilateral common iliac arteries and external iliac arteries.  There is a left femoral dialysis graft which is patent. There is hyperdense fragments of a abandoned  fistula graft in the low left anterior thigh adjacent to the current non occluded graft. There is a small 11 mm hypodensity associated with the midportion of the abandoned left thigh graft just deep to the cutaneous surface which may represent a very small abscess versus low-density granulation material within the lumen. There is edema within the anterolateral aspect of the thigh within the subcutaneous fat. There is no focal drainable fluid collection.  There is an occluded right thigh AV fistula graft. There is peripheral vascular atherosclerotic disease.  IMPRESSION: 1. Patent left femoral AV fistula graft. Adjacently there is hyperdense fragments from an abandoned left anterior thigh AV fistula with a small 11 mm hypodensity associated with the midportion of the left thigh graft just deep to the dermis which may reflect a small abscess versus low-density material within the lumen. There is no focal fluid collection around be functioning left femoral AV fistula graft. There is no large drainable fluid collection. There is soft tissue edema in the anterolateral aspect of the left thigh which may be reactive versus secondary to mild cellulitis. 2. Tricompartmental osteoarthritis of bilateral knees. Large left knee joint effusion.   Electronically Signed   By: Elige Ko   On: 10/30/2014 17:01    EKG showed sinus tachycardia rate 104 and nonspecific ST-T wave changes  Assessment/Plan Principal Problem:   Chest pain with history of CAD status post CABG - Currently chest pain resolved, troponin slightly positive POC, 0.09. Cardiology consulted - Obtain serial cardiac enzymes, cardiology suspects demand ischemia in the setting of anemia - Patient was admitted for the same in 09/2014, underwent cardiac cath which revealed severe LM and  occlusive RCA disease with patent grafts. - Per family, has similar symptoms when he is due for transfusion. We will transfuse 1 unit packed RBCs, can be done with  hemodialysis. He was due for transfusion tomorrow on 12/24.   Active Problems:   Anemia in neoplastic disease, myelodysplastic syndrome, thrombocytopenia -hold off aspirin for now, he has already received aspirin's 4 today. - Transfuse 1 unit packed RBCs, obtain FOBT - follows Dr Alvy Bimler outpatient    Hypertension -Currently stable, continue midodrine     Peripheral vascular disease -Follows Dr. Trula Slade outpatient    ESRD on dialysis - Nephrology service being notified by [facility], for hemodialysis today  Chronic combined systolic and diastolic CHF - Patient is due for his hemodialysis today, volume removal with HD   MRSA infected left thigh dialysis graft status post removal, 09/2014 - Seen by Dr. Trula Slade on 12/21, doing well - Continue wet-to-dry dressings daily with hydrogel, outpatient follow-up in 2 weeks  -  patient has been off antibiotics therapy   DVT prophylaxis:  SCDs   CODE STATUS:  full code   Family Communication: Admission, patients condition and plan of care including tests being ordered have been discussed with the patient and daughter who indicates understanding and agree with the plan and Code Status   Further plan will depend as patient's clinical course evolves and further radiologic and laboratory data become available.   Time Spent on Admission: 1 hour  Chen Saadeh M.D. [facility] 11/18/2014, 3:31 PM Pager: [phone]  If 7PM-7AM, please contact night-coverage [email or web address] Password [email or web address]

## 2014-11-18 NOTE — ED Notes (Signed)
Troponin results given to [place], [place]

## 2014-11-18 NOTE — ED Notes (Signed)
Code STEMI called by EMS. Crash cart at bedside, cardiology at bedside. MD reports cardiac cath will not be preformed today and EKG changes are minor from baseline EKG.

## 2014-11-18 NOTE — Progress Notes (Signed)
RN called MD. Pt will have hemodialysis tomorrow at [facility]. Nephrologist stated will transfuse blood tomorrow in Hemodialysis as planned.

## 2014-11-18 NOTE — Consult Note (Signed)
Patient ID: Andrew [name] MRN: [id number], DOB/AGE: [birth year]   Admit date: 11/18/2014   Primary Physician: [name]., MD Primary Cardiologist: Formerly R. Lattie Haw, MD - pt set to f/u in [place] office.  Pt. Profile:  80 y/o male with a complex PMH including ESRD, MDS, infected left thigh AVG s/p removal, and a h/o CAD s/p CABG with recent cath in Nov revealing 4/4 patent grafts who presented to the ED today with chest pain.  Problem List  Past Medical History  Diagnosis Date  . ESRD on hemodialysis     Hemodialysis since 2003; Occluded access in both upper extremities and the right lower extremity, current access as of Aug 2014 is L thigh AVG. Gets HD MWF at [facility].     . Duodenal ulcer     remote  . Chronic combined systolic and diastolic CHF (congestive heart failure)     a. 02/2012 Echo: EF 65%, basal infolat AK, mild conc LVH;  b. 06/2013 Echo: EF 40-45%, mild LVH, Gr2 DD, basal inf HK->AK, Mod AS, Mild MR, PASP 73;  c. 09/2014 Echo: EF 45% basal-mid inflat and inf AK, Gr 2DD, mod AS.  Marland Kitchen Arteriosclerotic cardiovascular disease (ASCVD) 1998    a. [id number]; b. 06/2003 Cath: 4/4 patent grafts, EF 55%;  c. 09/2014 Cath: LM 99d, LADnl, LCX nl, RCA 100p, LIMA->LAD nl, VG->OM2->OM3 nl, VG->RPL nl, EF 40-45%.  . Cerebrovascular disease   . Neuropathy of foot   . Degenerative joint disease   . Gastroesophageal reflux disease   . Impaired hearing     Bilateral; hearing aids relatively ineffective  . Hypertension   . Cholelithiasis 03/2011    Diagnosed incidentally on abdominal ultrasound in 03/2011  . Hepatic steatosis   . Macrocytosis 10/20/2012  . Thrombocytopenia 10/20/2012  . Hyperlipidemia   . Secondary hyperparathyroidism   . Peripheral vascular disease   . Gout   . Moderate aortic stenosis     a. 06/2013 Echo: Mod AS, mean grad: 25, peak grad: 57;  b. 09/2014 Echo: EF 45%, Gr 2 DD, Mod AS, valve area 1.15cm^2 (VTI), 1.1cm^2 (Vmax).  . Pulmonary  hypertension     a. 06/2013 Echo: PASP 2mmHg.  Marland Kitchen Anemia     Prior blood transfusion  . Complication of anesthesia     daughter reports long episodes confusion after amnesia meds  . Renal cell carcinoma 2001    s/p right nephrectomy-2001; subsequent ESRD  . 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 Hypotension   . Pancytopenia   . MRSA infection     a. 08/2014->10/2014 L thigh dialysis graft.  . Infection of AV graft for dialysis     a. Recurrent L thigh infxn s/p revision 10/1, I & D 11/28, and removal 11/01/2014.    Past Surgical History  Procedure Laterality Date  . Right nephrectomy  2001    Renal cell carcinoma  . Colonoscopy  01/24/2011    prominent vascular pattern, suboptimal prep but doable  . Esophagogastroduodenoscopy  01/24/2011    mild erosive reflux esophagitis, bulbar/antral erosions, bx from antrum benign  . Av fistula repair  2008    revision of anastomosis of Right AVF  . Arteriovenous graft placement  2006    Thrombectomy and interposition jump graft revision to higher axillary vein of LUA AVG  . Coronary artery bypass graft  1998    X4 VESSELS  . Multiple cysto/ resection tumor's with bx's  LAST ONE 05-09-2011  . Multiple surg's /  interventions for avgg/ fistula right upper arm  LAST REVISION 06-29-2011    (JUNE [id number] [place] VEIN/ [id number] DILATATION ANGIOPLASTY)  . Left ptc left renal artery    . Cardiac catheterization  2004  . Cataract extraction w/ intraocular lens  implant, bilateral    . Cystoscopy w/ retrogrades  12/28/2011    Procedure: CYSTOSCOPY WITH RETROGRADE PYELOGRAM;  Surgeon: Malka So, MD;  Location: [facility];  Service: Urology;  Laterality: Left;  C-ARM   . Transurethral resection of bladder tumor  12/28/2011    Procedure: TRANSURETHRAL RESECTION OF BLADDER TUMOR (TURBT);  Surgeon: Malka So, MD;  Location: [facility];  Service: Urology;  Laterality: N/A;  . Av fistula  placement  02/29/2012    Procedure: INSERTION OF ARTERIOVENOUS (AV) GORE-TEX GRAFT THIGH;  Surgeon: Rosetta Posner, MD;  Location: [facility];  Service: Vascular;  Laterality: Right;  Insertion right femoral arteriovenous gortex graft  . Ligation of right braciocephalic av fistula  [id number]  . Insertion of dialysis catheter  06/05/2012    Procedure: INSERTION OF DIALYSIS CATHETER;  Surgeon: Mal Misty, MD;  Location: [place];  Service: Vascular;  Laterality: Left;  Insertion diatek catheter left IJ  . Insertion of dialysis catheter  08/22/2012    Procedure: INSERTION OF DIALYSIS CATHETER;  Surgeon: Serafina Mitchell, MD;  Location: [facility];  Service: Vascular;  Laterality: Left;  insertion of dialysis catheter left  internal jugular 27cm  . Av fistula placement  10/08/2012    Procedure: INSERTION OF ARTERIOVENOUS (AV) GORE-TEX GRAFT THIGH;  Surgeon: Angelia Mould, MD;  Location: [facility];  Service: Vascular;  Laterality: Left;  . Cystoscopy with biopsy N/A 08/07/2013    Procedure: CYSTOSCOPY WITH BLADDER BIOPSY;  Surgeon: Malka So, MD;  Location: [facility];  Service: Urology;  Laterality: N/A;  . Fulguration of bladder tumor N/A 08/07/2013    Procedure: FULGURATION OF BLADDER TUMOR;  Surgeon: Malka So, MD;  Location: [facility];  Service: Urology;  Laterality: N/A;  . Revision of arteriovenous goretex graft Left 08/27/2014    Procedure: THROMBECTOMY & REVISION OF LEFT ARM  ARTERIOVENOUS GORETEX GRAFT;  Surgeon: Angelia Mould, MD;  Location: [facility];  Service: Vascular;  Laterality: Left;  . Revision of arteriovenous goretex graft Left 10/01/2014    Procedure: SEGMENT OF Left THIGH ARTERIOVENOUS GORETEX GRAFT Removed;  Surgeon: Mal Misty, MD;  Location: [facility];  Service: Vascular;  Laterality: Left;  . Removal of graft Left 11/01/2014    Procedure: REMOVAL OF INFECTED LEFT THIGH GRAFT;  Surgeon: Serafina Mitchell, MD;  Location: [facility];  Service: Vascular;  Laterality: Left;  removal of infected  left dialysis graft.  . Left heart catheterization with coronary/graft angiogram N/A 10/27/2014    Procedure: LEFT HEART CATHETERIZATION WITH Beatrix Fetters;  Surgeon: Leonie Man, MD;  Location: [facility] CATH LAB;  Service: Cardiovascular;  Laterality: N/A;     Allergies  No Known Allergies  HPI  80 y/o male with the above complex problem list.  He does has a h/o CAD and is s/p CABG x 4 in 1998.  He also has ESRD on MWF dialysis, moderate AS, myelodysplastic syndrome with chronic pancytopenia, and chronic diastolic CHF.  He recently had a prolonged hospitalization from 11/27 to 12/9 in the setting of an infected L thigh AV graft (MRSA) requiring I & D on 11/28 and then after recurrent fevers and pus from the site, removal  on 12/6.  During that hospitalization, he c/o chest pain and was found to have an EF of 45%.  Cath was carried out and showed native LM and RCA dzs with 4/4 patent grafts.  He was medically managed from a cardiac standpoint.    Since d/c on 12/9, he notes that he has done reasonably well.  He was d/c'd to SNF and has completed his post-op abx.  He's been seen by vascular surgery and his surgical site is felt to be healing well.  He's required outpt blood transfusions @ [facility] and says that he is scheduled for another transfusion for tomorrow after labs yesterday showed a Hgb of 8.1.  He has also been receiving outpt Granix injections.  From a cardiac standpoint, he says that he has had occasional chest discomfort since discharge.  He says it feels like indigestion and he has not had to take any sl NTG.  He is not particularly active @ home.  This AM while lying in bed, he developed dyspnea.  This persisted and upon sitting up, he noted severe sscp/tightness with worsening of dyspnea.  911 was called and he was treated with O2 and sl NTG with improvement in Ss.  He was transported to the [facility] ED and was initially called out as a Code STEMI, however this was called off upon further  review of his ECG.  He is currently pain free.  Point of care troponin is mildly elevated @ 0.09.  Home Medications  Prior to Admission medications   Medication Sig Start Date End Date Taking? Authorizing Provider  acetaminophen (TYLENOL) 500 MG tablet Take 500 mg by mouth every 6 (six) hours as needed. For pain/headache    Historical Provider, MD  albuterol (PROVENTIL HFA;VENTOLIN HFA) 108 (90 BASE) MCG/ACT inhaler Inhale 2 puffs into the lungs every 6 (six) hours as needed for wheezing or shortness of breath. 06/23/13   Lendon Colonel, NP  allopurinol (ZYLOPRIM) 100 MG tablet Take 100 mg by mouth 2 (two) times daily.  09/19/11   [name], MD  aspirin EC 81 MG tablet Take 81 mg by mouth at bedtime.     Historical Provider, MD  atorvastatin (LIPITOR) 10 MG tablet Take 10 mg by mouth every morning.     Historical Provider, MD  B Complex-C-Folic Acid (DIALYVITE TABLET) TABS Take 1 tablet by mouth at bedtime.  05/24/12   Historical Provider, MD  calcium carbonate (TUMS - DOSED IN MG ELEMENTAL CALCIUM) 500 MG chewable tablet Chew 1 tablet by mouth daily as needed for heartburn. For heartburn    Historical Provider, MD  cinacalcet (SENSIPAR) 30 MG tablet Take 30 mg by mouth daily with breakfast.     Historical Provider, MD  colchicine 0.6 MG tablet Take 0.6 mg by mouth 2 (two) times daily as needed (for gout).    Historical Provider, MD  docusate sodium (COLACE) 100 MG capsule Take 100 mg by mouth daily as needed for mild constipation.     Historical Provider, MD  econazole nitrate 1 % cream Apply 1 application topically daily as needed (Athletes foot).    Historical Provider, MD  Emollient (LUBRIDERM SERIOUSLY SENSITIVE) LOTN Apply 1 application topically daily.    Historical Provider, MD  gabapentin (NEURONTIN) 100 MG capsule Take 100-200 mg by mouth 2 (two) times daily. Takes 1 in the morning and 2 at night    Historical Provider, MD  midodrine (PROAMATINE) 10 MG tablet Take 10-20 mg by  mouth See admin instructions. On  Dialysis days (M,W,F), patient takes 2 tabs in the morning and 1 in the evening and on the other days patient takes 1 tablet in the morning and 2 in teh evening    Historical Provider, MD  oxyCODONE-acetaminophen (PERCOCET/ROXICET) 5-325 MG per tablet Take one tablet by mouth every 6 hours as needed for pain 11/05/14   Lauree Chandler, NP  polyethylene glycol powder (MIRALAX) powder Take 17 g by mouth at bedtime.     Historical Provider, MD  Pramoxine-Menthol (GOLD BOND MAXIMUM RELIEF EX) Apply 1 application topically daily as needed (Itching).    Historical Provider, MD  sevelamer (RENVELA) 800 MG tablet Take 1,600-2,400 mg by mouth See admin instructions. Take 3 tabs with each meals and 2 tabs with snacks    Historical Provider, MD  Tbo-Filgrastim (GRANIX) 480 MCG/0.8ML SOSY injection Inject 480 mcg into the skin once a week. Tuesdays    Historical Provider, MD    Family History  Family History  Problem Relation Age of Onset  . Colon cancer Neg Hx   . Liver disease Neg Hx   . Anesthesia problems Neg Hx   . Heart disease Father   . Other Mother     died @ 73 with respiratory issues following hip fx  . Diabetes Mother     Social History  History   Social History  . Marital Status: Widowed    Spouse Name: N/A    Number of Children: 3  . Years of Education: N/A   Occupational History  . Retired    Social History Main Topics  . Smoking status: Former Smoker -- 1.00 packs/day for 25 years    Types: Cigarettes    Quit date: 02/26/1984  . Smokeless tobacco: Former Systems developer     Comment: quit 1985  . Alcohol Use: 0.0 oz/week    0 Not specified per week     Comment: Rarely  . Drug Use: No  . Sexual Activity: Not Currently   Other Topics Concern  . Not on file   Social History Narrative   Lives in [place] by himself.  Family near by.     Review of Systems General:  Generalized malaise/fatigue.  No chills, fever, night sweats or weight  changes.  Cardiovascular:  +++ chest pain, +++ dyspnea, no edema, orthopnea, palpitations, paroxysmal nocturnal dyspnea. Dermatological: No rash, lesions/masses Respiratory: No cough, +++ dyspnea Urologic: No hematuria, dysuria Abdominal:   No nausea, vomiting, diarrhea, bright red blood per rectum, melena, or hematemesis Neurologic:  No visual changes, wkns, changes in mental status. All other systems reviewed and are otherwise negative except as noted above.  Physical Exam  SpO2 99 %.  General: Pleasant, NAD Psych: Normal affect. Neuro: Alert and oriented X 3. Moves all extremities spontaneously. HEENT: Normal  Neck: Supple.  JVP ~ 12 cm.  Radiated murmur on right. Lungs:  Resp regular and unlabored, CTA. Heart: RRR no s3, s4, 3/6 SEM RUSB  throughout. Abdomen: Soft, non-tender, non-distended, BS + x 4.  Extremities: No clubbing, cyanosis or edema. DP/PT/Radials 1+ and equal bilaterally.  Labs  Troponin [facility] of Care Test)  Recent Labs  11/18/14 1159  TROPIPOC 0.09*     Lab Results  Component Value Date   WBC 1.5* 11/17/2014   HGB 8.1* 11/17/2014   HCT 25.8* 11/17/2014   MCV 102.8* 11/17/2014   PLT 80* 11/17/2014   BMET pending  Lab Results  Component Value Date   CHOL 103 03/11/2012   HDL 39* 03/11/2012  LDLCALC 41 03/11/2012   TRIG 117 03/11/2012     Radiology/Studies  pending  ECG  ST, 104, nonspecific st/t changes.  ASSESSMENT AND PLAN  1.  Midsternal Chest Pain/CAD s/p CABG:  Pt presented with chest pain and dyspnea that started @ rest this AM and resolved after about an hour upon receiving O2 and ntg.  He is currently chest pain free.  ECG is non-acute.  POC troponin is mildly elevated @ 0.09 (was 0.07 3 wks ago).  Suspect demand ischemia in setting of anemia.  He recently had admission for c/p and underwent complete w/u including cath revealing severe LM and occlusive RCA dzs with 4/4 patent grafts.  Rec cycling of CE.  Hold off on  anticoagulation in the setting of chronic pancytopenia with H/H of 8.1/25.89, plts 80K yesterday.  Rec IM eval for transfusion (was scheduled as outpt for tomorrow) and other ongoing chronic medical issues including ESRD (today is a dialysis day for him).  Provided that troponin trend remains flat, would not pursue additional ischemic w/u in light of recent cath in November.  Cont statin.  2.  Myelodysplastic syndrome/Pancytopenia:  Due for transfusion as outpt tomorrow.  Given chest pain with strong likelihood for demand ischemia, rec inpt IM eval and transfusion.  3.  ESRD:  MWF dialysis - due today.  Rec renal consult.  4.  Chronic combined systolic and diastolic CHF:  Volume up this morning.  Today is his dialysis day.  5. HTN:  BP soft.  6.  MRSA L thigh AVG:  Doing well.  No further fevers. Off abx.  7.  HL:  Cont statin therapy.  Signed, Murray Hodgkins, NP 11/18/2014, 12:52 PM   Agree with note by Ignacia Bayley RNP  Pt with recent cath showing patent grafts and mild LV dysfunction. CRI on HD as well as myelodysplasia requiring transfusion.  Apparently he gets SOB with CP when he's anemic He hasn't had HD today. Was scheduled for Tx yesterday which didn't happen. No SOB now. Pain free. No acute EKG changes. Enz neg. Agree with admission, HD, Tx, cycle enz. Prob home tomorrow. Pt already has an appointment with Jory Sims PA-C in [place].  Lorretta Harp, M.D., [place], [facility], [place] [address] [place], [place]  [address]  [phone] 11/18/2014 2:55 PM

## 2014-11-18 NOTE — Progress Notes (Signed)
   11/18/14 1100  Clinical Encounter Type  Visited With Health care provider  Visit Type Initial;Code;ED   Chaplain responded to a Code Stemi page at 11:27 AM. Code Stemi was later cancelled. Page Elodia [place] chaplain if further support needed. Gar Ponto, Chaplain  11:55 AM

## 2014-11-18 NOTE — Consult Note (Signed)
Referring Provider: No ref. provider found Primary Care Physician:  [name]., MD Primary Nephrologist:  Dr. Florene Glen  Reason for Consultation:  Medical management of ESRD and Anemia and secondary hyperparathyroidism  HPI: 80 year old male with ESRD on hemodialysis, MWF, myelodysplastic syndrome, infected left high AVG s/p removal (follows Dr. Trula Slade) presented to ED with chest pain. He was scheduled for a blood transfusion due to symptomatic anemia and was scheduled dialysis today. He denies any shortness of breath and ankle swelling. He is no longer on antibiotics for the removal of part of a limb of thigh graft  Past Medical History  Diagnosis Date  . ESRD on hemodialysis     Hemodialysis since 2003; Occluded access in both upper extremities and the right lower extremity, current access as of Aug 2014 is L thigh AVG. Gets HD MWF at [facility].     . Duodenal ulcer     remote  . Chronic combined systolic and diastolic CHF (congestive heart failure)     a. 02/2012 Echo: EF 65%, basal infolat AK, mild conc LVH;  b. 06/2013 Echo: EF 40-45%, mild LVH, Gr2 DD, basal inf HK->AK, Mod AS, Mild MR, PASP 73;  c. 09/2014 Echo: EF 45% basal-mid inflat and inf AK, Gr 2DD, mod AS.  Marland Kitchen Arteriosclerotic cardiovascular disease (ASCVD) 1998    a. [id number]; b. 06/2003 Cath: 4/4 patent grafts, EF 55%;  c. 09/2014 Cath: LM 99d, LADnl, LCX nl, RCA 100p, LIMA->LAD nl, VG->OM2->OM3 nl, VG->RPL nl, EF 40-45%.  . Cerebrovascular disease   . Neuropathy of foot   . Degenerative joint disease   . Gastroesophageal reflux disease   . Impaired hearing     Bilateral; hearing aids relatively ineffective  . Hypertension   . Cholelithiasis 03/2011    Diagnosed incidentally on abdominal ultrasound in 03/2011  . Hepatic steatosis   . Macrocytosis 10/20/2012  . Thrombocytopenia 10/20/2012  . Hyperlipidemia   . Secondary hyperparathyroidism   . Peripheral vascular disease   . Gout   . Moderate aortic  stenosis     a. 06/2013 Echo: Mod AS, mean grad: 25, peak grad: 57;  b. 09/2014 Echo: EF 45%, Gr 2 DD, Mod AS, valve area 1.15cm^2 (VTI), 1.1cm^2 (Vmax).  . Pulmonary hypertension     a. 06/2013 Echo: PASP 76mmHg.  Marland Kitchen Anemia     Prior blood transfusion  . Complication of anesthesia     daughter reports long episodes confusion after amnesia meds  . Renal cell carcinoma 2001    s/p right nephrectomy-2001; subsequent ESRD  . 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 Hypotension   . Pancytopenia   . MRSA infection     a. 08/2014->10/2014 L thigh dialysis graft.  . Infection of AV graft for dialysis     a. Recurrent L thigh infxn s/p revision 10/1, I & D 11/28, and removal 11/01/2014.    Past Surgical History  Procedure Laterality Date  . Right nephrectomy  2001    Renal cell carcinoma  . Colonoscopy  01/24/2011    prominent vascular pattern, suboptimal prep but doable  . Esophagogastroduodenoscopy  01/24/2011    mild erosive reflux esophagitis, bulbar/antral erosions, bx from antrum benign  . Av fistula repair  2008    revision of anastomosis of Right AVF  . Arteriovenous graft placement  2006    Thrombectomy and interposition jump graft revision to higher axillary vein of LUA AVG  . Coronary artery bypass graft  1998    X4 VESSELS  .  Multiple cysto/ resection tumor's with bx's  LAST ONE 05-09-2011  . Multiple surg's / interventions for avgg/ fistula right upper arm  LAST REVISION 06-29-2011    ([id number] Vivian VEIN/ [id number] DILATATION ANGIOPLASTY)  . Left ptc left renal artery    . Cardiac catheterization  2004  . Cataract extraction w/ intraocular lens  implant, bilateral    . Cystoscopy w/ retrogrades  12/28/2011    Procedure: CYSTOSCOPY WITH RETROGRADE PYELOGRAM;  Surgeon: Malka So, MD;  Location: [facility];  Service: Urology;  Laterality: Left;  C-ARM   . Transurethral resection of bladder tumor  12/28/2011    Procedure:  TRANSURETHRAL RESECTION OF BLADDER TUMOR (TURBT);  Surgeon: Malka So, MD;  Location: [facility];  Service: Urology;  Laterality: N/A;  . Av fistula placement  02/29/2012    Procedure: INSERTION OF ARTERIOVENOUS (AV) GORE-TEX GRAFT THIGH;  Surgeon: Rosetta Posner, MD;  Location: [place];  Service: Vascular;  Laterality: Right;  Insertion right femoral arteriovenous gortex graft  . Ligation of right braciocephalic av fistula  [id number]  . Insertion of dialysis catheter  06/05/2012    Procedure: INSERTION OF DIALYSIS CATHETER;  Surgeon: Mal Misty, MD;  Location: [place];  Service: Vascular;  Laterality: Left;  Insertion diatek catheter left IJ  . Insertion of dialysis catheter  08/22/2012    Procedure: INSERTION OF DIALYSIS CATHETER;  Surgeon: Serafina Mitchell, MD;  Location: [facility];  Service: Vascular;  Laterality: Left;  insertion of dialysis catheter left  internal jugular 27cm  . Av fistula placement  10/08/2012    Procedure: INSERTION OF ARTERIOVENOUS (AV) GORE-TEX GRAFT THIGH;  Surgeon: Angelia Mould, MD;  Location: [place];  Service: Vascular;  Laterality: Left;  . Cystoscopy with biopsy N/A 08/07/2013    Procedure: CYSTOSCOPY WITH BLADDER BIOPSY;  Surgeon: Malka So, MD;  Location: [facility];  Service: Urology;  Laterality: N/A;  . Fulguration of bladder tumor N/A 08/07/2013    Procedure: FULGURATION OF BLADDER TUMOR;  Surgeon: Malka So, MD;  Location: [facility];  Service: Urology;  Laterality: N/A;  . Revision of arteriovenous goretex graft Left 08/27/2014    Procedure: THROMBECTOMY & REVISION OF LEFT ARM  ARTERIOVENOUS GORETEX GRAFT;  Surgeon: Angelia Mould, MD;  Location: [place];  Service: Vascular;  Laterality: Left;  . Revision of arteriovenous goretex graft Left 10/01/2014    Procedure: SEGMENT OF Left THIGH ARTERIOVENOUS GORETEX GRAFT Removed;  Surgeon: Mal Misty, MD;  Location: [place];  Service: Vascular;  Laterality: Left;  . Removal of graft Left  11/01/2014    Procedure: REMOVAL OF INFECTED LEFT THIGH GRAFT;  Surgeon: Serafina Mitchell, MD;  Location: [facility];  Service: Vascular;  Laterality: Left;  removal of infected left dialysis graft.  . Left heart catheterization with coronary/graft angiogram N/A 10/27/2014    Procedure: LEFT HEART CATHETERIZATION WITH Beatrix Fetters;  Surgeon: Leonie Man, MD;  Location: [facility] C[facility];  Service: Cardiovascular;  Laterality: N/A;    Prior to Admission medications   Medication Sig Start Date End Date Taking? Authorizing Provider  acetaminophen (TYLENOL) 500 MG tablet Take 500 mg by mouth every 6 (six) hours as needed. For pain/headache   Yes Historical Provider, MD  albuterol (PROVENTIL HFA;VENTOLIN HFA) 108 (90 BASE) MCG/ACT inhaler Inhale 2 puffs into the lungs every 6 (six) hours as needed for wheezing or shortness of breath. 06/23/13  Yes Lendon Colonel, NP  allopurinol (ZYLOPRIM) 100 MG tablet Take 100 mg by mouth 2 (two) times daily.  09/19/11  Yes [name], MD  aspirin EC 81 MG tablet Take 81 mg by mouth at bedtime.    Yes Historical Provider, MD  B Complex-C-Folic Acid (DIALYVITE TABLET) TABS Take 1 tablet by mouth at bedtime.  05/24/12  Yes Historical Provider, MD  calcium carbonate (TUMS - DOSED IN MG ELEMENTAL CALCIUM) 500 MG chewable tablet Chew 1 tablet by mouth daily as needed for heartburn. For heartburn   Yes Historical Provider, MD  cinacalcet (SENSIPAR) 30 MG tablet Take 30 mg by mouth daily with breakfast.    Yes Historical Provider, MD  colchicine 0.6 MG tablet Take 0.6 mg by mouth 2 (two) times daily as needed (for gout).   Yes Historical Provider, MD  docusate sodium (COLACE) 100 MG capsule Take 100 mg by mouth daily as needed for mild constipation.    Yes Historical Provider, MD  econazole nitrate 1 % cream Apply 1 application topically daily as needed (Athletes foot).   Yes Historical Provider, MD  Emollient (LUBRIDERM SERIOUSLY SENSITIVE) LOTN Apply 1  application topically daily.   Yes Historical Provider, MD  gabapentin (NEURONTIN) 100 MG capsule Take 100-200 mg by mouth 2 (two) times daily. Takes 1 in the morning and 2 at night   Yes Historical Provider, MD  midodrine (PROAMATINE) 10 MG tablet Take 10-20 mg by mouth See admin instructions. On Dialysis days (M,W,F), patient takes 2 tabs in the morning and 1 in the evening and on the other days patient takes 1 tablet in the morning and 2 in teh evening   Yes Historical Provider, MD  oxyCODONE-acetaminophen (PERCOCET/ROXICET) 5-325 MG per tablet Take one tablet by mouth every 6 hours as needed for pain 11/05/14  Yes Lauree Chandler, NP  polyethylene glycol powder (MIRALAX) powder Take 17 g by mouth at bedtime.    Yes Historical Provider, MD  Pramoxine-Menthol (GOLD BOND MAXIMUM RELIEF EX) Apply 1 application topically daily as needed (Itching).   Yes Historical Provider, MD  sevelamer (RENVELA) 800 MG tablet Take 1,600-2,400 mg by mouth See admin instructions. Take 3 tabs with each meals and 2 tabs with snacks   Yes Historical Provider, MD  Tbo-Filgrastim (GRANIX) 480 MCG/0.8ML SOSY injection Inject 480 mcg into the skin once a week. Tuesdays   Yes Historical Provider, MD    Current Facility-Administered Medications  Medication Dose Route Frequency Provider Last Rate Last Dose  . sodium polystyrene (KAYEXALATE) 15 GM/60ML suspension 30 g  30 g Oral Once Malvin Johns, MD       No current outpatient prescriptions on file.   Facility-Administered Medications Ordered in Other Encounters  Medication Dose Route Frequency Provider Last Rate Last Dose  . methylPREDNISolone sodium succinate (SOLU-MEDROL) 125 mg/2 mL injection 60 mg  60 mg Intravenous Once Sol Blazing, MD        Allergies as of 11/18/2014  . (No Known Allergies)    Family History  Problem Relation Age of Onset  . Colon cancer Neg Hx   . Liver disease Neg Hx   . Anesthesia problems Neg Hx   . Heart disease Father   .  Other Mother     died @ 63 with respiratory issues following hip fx  . Diabetes Mother     History   Social History  . Marital Status: Widowed    Spouse Name: N/A    Number of Children: 3  . Years of Education:  N/A   Occupational History  . Retired    Social History Main Topics  . Smoking status: Former Smoker -- 1.00 packs/day for 25 years    Types: Cigarettes    Quit date: 02/26/1984  . Smokeless tobacco: Former Systems developer     Comment: quit 1985  . Alcohol Use: 0.0 oz/week    0 Not specified per week     Comment: Rarely  . Drug Use: No  . Sexual Activity: Not Currently   Other Topics Concern  . Not on file   Social History Narrative   Lives in [place] by himself.  Family near by.    Review of Systems: Gen: Denies any fever, chills, sweats, anorexia, fatigue, weakness, malaise, weight loss, and sleep disorder HEENT: some senile changes in vision.   CV: + Chest pain Resp: + dyspnea with exercise, cough, GI: Denies vomiting blood, jaundice, and fecal incontinence.   Denies dysphagia or odynophagia. GU : +  ESRD MS: Denies joint pain, limitation of movement, and swelling, stiffness, low back pain, extremity pain. Denies muscle weakness, cramps, atrophy.  No use of non steroidal antiinflammatory drugs. Derm: Denies rash, itching, dry skin, hives, moles, warts, or unhealing ulcers.  Psych: Denies depression, anxiety, memory loss, suicidal ideation, hallucinations, paranoia, and confusion. Heme: Denies bruising, bleeding, and enlarged lymph nodes. Neuro: No headache.  No diplopia. No dysarthria.  No dysphasia.  No history of CVA.  No Seizures. No paresthesias.  No weakness. Endocrine No DM.  No Thyroid disease.  No Adrenal disease.  Physical Exam: Vital signs in last 24 hours: Pulse Rate:  [86-97] 88 (12/23 1545) Resp:  [16-21] 17 (12/23 1545) BP: (136-167)/(50-65) 150/62 mmHg (12/23 1545) SpO2:  [93 %-99 %] 97 % (12/23 1545)   General:   Chronically ill Head:   Normocephalic and atraumatic. Eyes:  Sclera clear, no icterus.   Conjunctiva pink. Ears:  Normal auditory acuity. Nose:  No deformity, discharge,  or lesions. Mouth:  No deformity or lesions, dentition normal. Neck:  Supple; no masses or thyromegaly. JVP not elevated Lungs:  Clear throughout to auscultation.   No wheezes, crackles, or rhonchi. No acute distress. Heart:  Regular rate and rhythm; no murmurs, clicks, rubs,  or gallops. Abdomen:  Soft, nontender and nondistended. No masses, hepatosplenomegaly or hernias noted. Normal bowel sounds, without guarding, and without rebound.   Msk:  Symmetrical without gross deformities. Normal posture. Pulses:  No carotid, renal, femoral bruits. DP and PT symmetrical and equal Extremities:  Without clubbing or edema. Neurologic:  Alert and  oriented x4;  grossly normal neurologically. Skin:  Intact without significant lesions or rashes. Cervical Nodes:  No significant cervical adenopathy. Psych:  Alert and cooperative. Normal mood and affect.  Intake/Output from previous day:   Intake/Output this shift:    Lab Results:  Recent Labs  11/17/14 1024 11/18/14 1158  WBC 1.5* 7.9  HGB 8.1* 8.2*  HCT 25.8* 25.4*  PLT 80* 77*   BMET  Recent Labs  11/18/14 1158  NA 136  K 5.8*  CL 99  CO2 27  GLUCOSE 98  BUN 44*  CREATININE 6.69*  CALCIUM 8.4   LFT No results for input(s): PROT, ALBUMIN, AST, ALT, ALKPHOS, BILITOT, BILIDIR, IBILI in the last 72 hours. PT/INR No results for input(s): LABPROT, INR in the last 72 hours. Hepatitis Panel No results for input(s): HEPBSAG, HCVAB, HEPAIGM, HEPBIGM in the last 72 hours.  Studies/Results: Dg Chest Port 1 View  11/18/2014   CLINICAL DATA:  Chest  pain, tightness  EXAM: PORTABLE CHEST - 1 VIEW  COMPARISON:  11/07/2014  FINDINGS: There are bilateral interstitial and alveolar airspace opacities. There is no significant pleural effusion or pneumothorax. There is mild stable cardiomegaly. There  is evidence of prior CABG. There is no acute osseous abnormality.  IMPRESSION: Overall appearance concerning for mild pulmonary edema.   Electronically Signed   By: Kathreen Devoid   On: 11/18/2014 12:39    Assessment/Plan:  ESRD-  MWF holiday schedule will plan dialysis in AM (Thu)  ANEMIA-  Myelodysplasia  Will transfuse on dilaysis  MBD- binders and vitamin D as needed  HTN/VOL-  Midodrine for chronically low BP  ACCESS- AVG thigh graft  History of infected limb     LOS: 0 Andrew [name] W $RemoveBefor'@[email or web address]$ '@4'$ :16 PM

## 2014-11-19 DIAGNOSIS — R079 Chest pain, unspecified: Secondary | ICD-10-CM

## 2014-11-19 LAB — RENAL FUNCTION PANEL
Albumin: 3 g/dL — ABNORMAL LOW (ref 3.5–5.2)
Anion gap: 9 (ref 5–15)
BUN: 54 mg/dL — ABNORMAL HIGH (ref 6–23)
CALCIUM: 8.1 mg/dL — AB (ref 8.4–10.5)
CO2: 28 mmol/L (ref 19–32)
Chloride: 100 mEq/L (ref 96–112)
Creatinine, Ser: 7.96 mg/dL — ABNORMAL HIGH (ref 0.50–1.35)
GFR, EST AFRICAN AMERICAN: 6 mL/min — AB (ref >90)
GFR, EST NON AFRICAN AMERICAN: 5 mL/min — AB (ref >90)
Glucose, Bld: 94 mg/dL (ref 70–99)
PHOSPHORUS: 3.6 mg/dL (ref 2.3–4.6)
Potassium: 6.2 mmol/L (ref 3.5–5.1)
Sodium: 137 mmol/L (ref 135–145)

## 2014-11-19 LAB — PREPARE RBC (CROSSMATCH)

## 2014-11-19 LAB — CBC
HCT: 25.9 % — ABNORMAL LOW (ref 39.0–52.0)
Hemoglobin: 8.2 g/dL — ABNORMAL LOW (ref 13.0–17.0)
MCH: 32.8 pg (ref 26.0–34.0)
MCHC: 31.7 g/dL (ref 30.0–36.0)
MCV: 103.6 fL — ABNORMAL HIGH (ref 78.0–100.0)
PLATELETS: 122 10*3/uL — AB (ref 150–400)
RBC: 2.5 MIL/uL — AB (ref 4.22–5.81)
RDW: 23.5 % — ABNORMAL HIGH (ref 11.5–15.5)
WBC: 11.4 10*3/uL — ABNORMAL HIGH (ref 4.0–10.5)

## 2014-11-19 LAB — TROPONIN I: Troponin I: 0.15 ng/mL — ABNORMAL HIGH (ref <0.031)

## 2014-11-19 MED ORDER — ALTEPLASE 2 MG IJ SOLR
2.0000 mg | Freq: Once | INTRAMUSCULAR | Status: DC | PRN
  Filled 2014-11-19: qty 2

## 2014-11-19 MED ORDER — SODIUM CHLORIDE 0.9 % IV SOLN: 100.0000 mL | INTRAVENOUS | Status: DC | PRN

## 2014-11-19 MED ORDER — SODIUM CHLORIDE 0.9 % IV SOLN: Freq: Once | INTRAVENOUS | Status: DC

## 2014-11-19 MED ORDER — HEPARIN SODIUM (PORCINE) 1000 UNIT/ML DIALYSIS: 1000.0000 [IU] | INTRAMUSCULAR | Status: DC | PRN

## 2014-11-19 MED ORDER — LIDOCAINE HCL (PF) 1 % IJ SOLN: 5.0000 mL | INTRAMUSCULAR | Status: DC | PRN

## 2014-11-19 MED ORDER — LIDOCAINE-PRILOCAINE 2.5-2.5 % EX CREA
1.0000 "application " | TOPICAL_CREAM | CUTANEOUS | Status: DC | PRN
  Filled 2014-11-19: qty 5

## 2014-11-19 MED ORDER — PENTAFLUOROPROP-TETRAFLUOROETH EX AERO: 1.0000 "application " | INHALATION_SPRAY | CUTANEOUS | Status: DC | PRN

## 2014-11-19 MED ORDER — NEPRO/CARBSTEADY PO LIQD
237.0000 mL | ORAL | Status: DC | PRN
  Filled 2014-11-19: qty 237

## 2014-11-19 NOTE — Discharge Instructions (Signed)
Follow with Primary MD [name]., MD in 7 days   Get CBC, CMP, 2 view Chest X ray checked  by Primary MD next visit.    Activity: As tolerated with Full fall precautions use walker/cane & assistance as needed   Disposition Home    Diet: renal diet, with feeding assistance and aspiration precautions as needed.  For Heart failure patients - Check your Weight same time everyday, if you gain over 2 pounds, or you develop in leg swelling, experience more shortness of breath or chest pain, call your Primary MD immediately. Follow Cardiac Low Salt Diet and 1.2 lit/day fluid restriction.   On your next visit with your primary care physician please Get Medicines reviewed and adjusted.   Please request your Prim.MD to go over all Hospital Tests and Procedure/Radiological results at the follow up, please get all Hospital records sent to your Prim MD by signing hospital release before you go home.   If you experience worsening of your admission symptoms, develop shortness of breath, life threatening emergency, suicidal or homicidal thoughts you must seek medical attention immediately by calling 911 or calling your MD immediately  if symptoms less severe.  You Must read complete instructions/literature along with all the possible adverse reactions/side effects for all the Medicines you take and that have been prescribed to you. Take any new Medicines after you have completely understood and accpet all the possible adverse reactions/side effects.   Do not drive, operating heavy machinery, perform activities at heights, swimming or participation in water activities or provide baby sitting services if your were admitted for syncope or siezures until you have seen by Primary MD or a Neurologist and advised to do so again.  Do not drive when taking Pain medications.    Do not take more than prescribed Pain, Sleep and Anxiety Medications  Special Instructions: If you have smoked or chewed  Tobacco  in the last 2 yrs please stop smoking, stop any regular Alcohol  and or any Recreational drug use.  Wear Seat belts while driving.   Please note  You were cared for by a hospitalist during your hospital stay. If you have any questions about your discharge medications or the care you received while you were in the hospital after you are discharged, you can call the unit and asked to speak with the hospitalist on call if the hospitalist that took care of you is not available. Once you are discharged, your primary care physician will handle any further medical issues. Please note that NO REFILLS for any discharge medications will be authorized once you are discharged, as it is imperative that you return to your primary care physician (or establish a relationship with a primary care physician if you do not have one) for your aftercare needs so that they can reassess your need for medications and monitor your lab values.

## 2014-11-19 NOTE — Discharge Summary (Signed)
Andrew [name], 80 y.o., DOB [birth year], MRN [id number]. Admission date: 11/18/2014 Discharge Date 11/19/2014 Primary MD [name]., MD Admitting Physician Ripudeep Krystal Eaton, MD  Admission Diagnosis  Unstable angina [I20.0] ESRD (end stage renal disease) [N18.6]  Discharge Diagnosis   Principal Problem:   Chest pain Active Problems:   Anemia in neoplastic disease   Hypertension   Peripheral vascular disease   CAD (coronary artery disease)   MDS (myelodysplastic syndrome) with 5q deletion   Thrombocytopenia   SOB (shortness of breath)   ESRD on dialysis      Past Medical History  Diagnosis Date  . ESRD on hemodialysis     Hemodialysis since 2003; Occluded access in both upper extremities and the right lower extremity, current access as of Aug 2014 is L thigh AVG. Gets HD MWF at [facility].     . Duodenal ulcer     remote  . Chronic combined systolic and diastolic CHF (congestive heart failure)     a. 02/2012 Echo: EF 65%, basal infolat AK, mild conc LVH;  b. 06/2013 Echo: EF 40-45%, mild LVH, Gr2 DD, basal inf HK->AK, Mod AS, Mild MR, PASP 73;  c. 09/2014 Echo: EF 45% basal-mid inflat and inf AK, Gr 2DD, mod AS.  Marland Kitchen Arteriosclerotic cardiovascular disease (ASCVD) 1998    a. [id number]; b. 06/2003 Cath: 4/4 patent grafts, EF 55%;  c. 09/2014 Cath: LM 99d, LADnl, LCX nl, RCA 100p, LIMA->LAD nl, VG->OM2->OM3 nl, VG->RPL nl, EF 40-45%.  . Cerebrovascular disease   . Neuropathy of foot   . Degenerative joint disease   . Gastroesophageal reflux disease   . Impaired hearing     Bilateral; hearing aids relatively ineffective  . Hypertension   . Cholelithiasis 03/2011    Diagnosed incidentally on abdominal ultrasound in 03/2011  . Hepatic steatosis   . Macrocytosis 10/20/2012  . Thrombocytopenia 10/20/2012  . Hyperlipidemia   . Secondary hyperparathyroidism   . Peripheral vascular disease   . Gout   . Moderate aortic stenosis     a. 06/2013 Echo: Mod AS, mean grad: 25, peak  grad: 57;  b. 09/2014 Echo: EF 45%, Gr 2 DD, Mod AS, valve area 1.15cm^2 (VTI), 1.1cm^2 (Vmax).  . Pulmonary hypertension     a. 06/2013 Echo: PASP 29mmHg.  Marland Kitchen Anemia     Prior blood transfusion  . Complication of anesthesia     daughter reports long episodes confusion after amnesia meds  . Renal cell carcinoma 2001    s/p right nephrectomy-2001; subsequent ESRD  . 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 Hypotension   . Pancytopenia   . MRSA infection     a. 08/2014->10/2014 L thigh dialysis graft.  . Infection of AV graft for dialysis     a. Recurrent L thigh infxn s/p revision 10/1, I & D 11/28, and removal 11/01/2014.    Past Surgical History  Procedure Laterality Date  . Right nephrectomy  2001    Renal cell carcinoma  . Colonoscopy  01/24/2011    prominent vascular pattern, suboptimal prep but doable  . Esophagogastroduodenoscopy  01/24/2011    mild erosive reflux esophagitis, bulbar/antral erosions, bx from antrum benign  . Av fistula repair  2008    revision of anastomosis of Right AVF  . Arteriovenous graft placement  2006    Thrombectomy and interposition jump graft revision to higher axillary vein of LUA AVG  . Coronary artery bypass graft  1998    X4 VESSELS  .  Multiple cysto/ resection tumor's with bx's  LAST ONE 05-09-2011  . Multiple surg's / interventions for avgg/ fistula right upper arm  LAST REVISION 06-29-2011    (JUNE [id number] [place] VEIN/ [id number] DILATATION ANGIOPLASTY)  . Left ptc left renal artery    . Cardiac catheterization  2004  . Cataract extraction w/ intraocular lens  implant, bilateral    . Cystoscopy w/ retrogrades  12/28/2011    Procedure: CYSTOSCOPY WITH RETROGRADE PYELOGRAM;  Surgeon: Malka So, MD;  Location: [facility];  Service: Urology;  Laterality: Left;  C-ARM   . Transurethral resection of bladder tumor  12/28/2011    Procedure: TRANSURETHRAL RESECTION OF BLADDER TUMOR (TURBT);  Surgeon:  Malka So, MD;  Location: [facility];  Service: Urology;  Laterality: N/A;  . Av fistula placement  02/29/2012    Procedure: INSERTION OF ARTERIOVENOUS (AV) GORE-TEX GRAFT THIGH;  Surgeon: Rosetta Posner, MD;  Location: [place];  Service: Vascular;  Laterality: Right;  Insertion right femoral arteriovenous gortex graft  . Ligation of right braciocephalic av fistula  [id number]  . Insertion of dialysis catheter  06/05/2012    Procedure: INSERTION OF DIALYSIS CATHETER;  Surgeon: Mal Misty, MD;  Location: [place];  Service: Vascular;  Laterality: Left;  Insertion diatek catheter left IJ  . Insertion of dialysis catheter  08/22/2012    Procedure: INSERTION OF DIALYSIS CATHETER;  Surgeon: Serafina Mitchell, MD;  Location: [facility];  Service: Vascular;  Laterality: Left;  insertion of dialysis catheter left  internal jugular 27cm  . Av fistula placement  10/08/2012    Procedure: INSERTION OF ARTERIOVENOUS (AV) GORE-TEX GRAFT THIGH;  Surgeon: Angelia Mould, MD;  Location: [place];  Service: Vascular;  Laterality: Left;  . Cystoscopy with biopsy N/A 08/07/2013    Procedure: CYSTOSCOPY WITH BLADDER BIOPSY;  Surgeon: Malka So, MD;  Location: [facility];  Service: Urology;  Laterality: N/A;  . Fulguration of bladder tumor N/A 08/07/2013    Procedure: FULGURATION OF BLADDER TUMOR;  Surgeon: Malka So, MD;  Location: [facility];  Service: Urology;  Laterality: N/A;  . Revision of arteriovenous goretex graft Left 08/27/2014    Procedure: THROMBECTOMY & REVISION OF LEFT ARM  ARTERIOVENOUS GORETEX GRAFT;  Surgeon: Angelia Mould, MD;  Location: [place];  Service: Vascular;  Laterality: Left;  . Revision of arteriovenous goretex graft Left 10/01/2014    Procedure: SEGMENT OF Left THIGH ARTERIOVENOUS GORETEX GRAFT Removed;  Surgeon: Mal Misty, MD;  Location: [place];  Service: Vascular;  Laterality: Left;  . Removal of graft Left 11/01/2014    Procedure: REMOVAL OF INFECTED LEFT THIGH GRAFT;   Surgeon: Serafina Mitchell, MD;  Location: [facility];  Service: Vascular;  Laterality: Left;  removal of infected left dialysis graft.  . Left heart catheterization with coronary/graft angiogram N/A 10/27/2014    Procedure: LEFT HEART CATHETERIZATION WITH Beatrix Fetters;  Surgeon: Leonie Man, MD;  Location: [facility] CATH LAB;  Service: Cardiovascular;  Laterality: N/A;   Admission history of present illness/brief narrative:  Patient is 80 year old male with ESRD on hemodialysis, MWF, myelodysplastic syndrome, infected left high AVG s/p removal (follows Dr. Trula Slade) presented to ED with chest pain. Patient reported that he was getting ready in the morning to go to his regular dialysis center, was sitting at edge of the bed when he started having chest pain around 9 AM. He had a prior episode that had resolved on its own  around 6 AM. Patient reported the chest pain as midsternal, associated with shortness of breath, nausea but no diaphoresis or radiation of the chest pain. The chest pain lasted about an hour and patient took 4 baby aspirin and 2 nitroglycerin via EMS. . Patient is also due for dialysis today. At the time of my encounter chest pain has completely resolved. Patient also also reported that he usually gets short of breath and chest tightness when he is due for his transfusion. He was considered code STEMI initially and was seen by cardiology. Patient had a recent cardiac cath, EF 40-45%, with patent grafts. Cardiology recommended admission to medicine for observation, rule out ACS. Patient was admitted, his cardiac enzymes remained stable at baseline on admission 0.14 > 0.14 > 0.15 , patient denies any further chest pain or shortness of breath, patient was dialyzed on 12/24, received 1 unit packed red blood cell transfusion with his known history of myelodysplasia, patient was dialyzed with 1K bath given his potassium 6.2 before dialysis.  Hospital Course See H&P, Labs, Consult and Test  reports for all details in brief, patient was admitted for **  Principal Problem:   Chest pain Active Problems:   Anemia in neoplastic disease   Hypertension   Peripheral vascular disease   CAD (coronary artery disease)   MDS (myelodysplastic syndrome) with 5q deletion   Thrombocytopenia   SOB (shortness of breath)   ESRD on dialysis  Chest pain with history of CAD status post CABG - Currently chest pain resolved, initially elevated from his baseline, but remained stable during hospitalization. - Cardiology consult appreciated, chest pain most likely related to anemia, stable cardiac enzymes, observe overnight as per cardiology recommendation. - Recent cardiac cath showing 4 out of 4 patent graft   Anemia in neoplastic disease, myelodysplastic syndrome, thrombocytopenia - Transfused 1 unit packed RBCs 12/24 - follows Dr Alvy Bimler outpatient   Hypertension -Currently stable, continue midodrine   Peripheral vascular disease -Follows Dr. Trula Slade outpatient   ESRD on dialysis - Patient was dialyzed 12/24  Chronic combined systolic and diastolic CHF - Continue with hemodialysis Monday Wednesday Friday  MRSA infected left thigh dialysis graft status post removal, 09/2014 - Seen by Dr. Trula Slade on 12/21, doing well - Continue wet-to-dry dressings daily with hydrogel, outpatient follow-up in 2 weeks  - patient has been off antibiotics therapy    Consults   Cardiology Nephrology  Significant Tests:  See full reports for all details    Dg Chest 2 View  11/07/2014   CLINICAL DATA:  80 year old male with cough and positive PPD the  EXAM: CHEST  2 VIEW  COMPARISON:  Prior chest x-ray 10/29/2014  FINDINGS: Stable mild cardiomegaly. Patient is status post median sternotomy with evidence of multivessel CABG. Atherosclerotic calcification present in the transverse aorta. No focal airspace consolidation, pulmonary edema, pleural effusion or pneumothorax. Stable bronchitic change  and a mild prominence of the interstitial markings. Similar configuration of right axillary vein flare stent graft with possible fracture versus infolding inferiorly. No acute osseous abnormality.  IMPRESSION: Stable chest x-ray without evidence of acute cardiopulmonary process.   Electronically Signed   By: Jacqulynn Cadet M.D.   On: 11/07/2014 09:55   Dg Chest 2 View  10/29/2014   CLINICAL DATA:  Fever 10/24/2014  EXAM: CHEST  2 VIEW  COMPARISON:  None.  FINDINGS: Interval improvement in aeration bilaterally. Resolution of previously noted infiltrate or edema on the right. Vascularity has improved and is now normal. Lungs are clear and  there is no effusion. Right axillary stent with kinking unchanged.  IMPRESSION: Improved aeration bilaterally which may be due to clearing edema or pneumonia. Lungs are now clear.   Electronically Signed   By: Franchot Gallo M.D.   On: 10/29/2014 09:04   [place] Extrem Low Left Ltd  10/29/2014   CLINICAL DATA:  Patient with history of revised left thigh dialysis graft, now with focal area of erythema. Evaluate for abscess.  EXAM: ULTRASOUND LEFT LOWER EXTREMITY LIMITED  TECHNIQUE: Ultrasound examination of the lower extremity soft tissues was performed in the area of clinical concern.  COMPARISON:  None  FINDINGS: Multiple grayscale and color flow images were provided of the patient's left upper thigh at the area of concern. Additionally, real-time sonographic evaluation was performed by the dictating radiologist.  Acquired cinegraphic images demonstrate that the focal area of erythema within the patient's upper medial thigh corresponds with focal dermal erosion superficial to the abandoned medial thigh dialysis graft. There is no associated organized fluid collection or abscess. This focal area of erythema is remote from the patient's functioning left thigh dialysis graft.  The lateral aspect of the patient's functioning left upper thigh dialysis graft appears widely patent  where imaged.  IMPRESSION: 1. Focal area of erythema involving the left thigh corresponds to a focal area of dermal erosion superficial to the patient's abandoned left thigh dialysis graft. No associated organized fluid collection or abscess. 2. The lateral aspect of the patient's functioning left thigh graft appears widely patent were imaged.   Electronically Signed   By: Sandi Mariscal M.D.   On: 10/29/2014 16:25   Dg Chest Port 1 View  11/18/2014   CLINICAL DATA:  Chest pain, tightness  EXAM: PORTABLE CHEST - 1 VIEW  COMPARISON:  11/07/2014  FINDINGS: There are bilateral interstitial and alveolar airspace opacities. There is no significant pleural effusion or pneumothorax. There is mild stable cardiomegaly. There is evidence of prior CABG. There is no acute osseous abnormality.  IMPRESSION: Overall appearance concerning for mild pulmonary edema.   Electronically Signed   By: Kathreen Devoid   On: 11/18/2014 12:39   Dg Chest Port 1 View  10/24/2014   CLINICAL DATA:  Shortness of breath  EXAM: PORTABLE CHEST - 1 VIEW  COMPARISON:  10/23/2014  FINDINGS: Mild patchy opacity in the right upper and lower lobes, worrisome for multifocal pneumonia, less likely asymmetric interstitial edema. No pleural effusion or pneumothorax.  Cardiomegaly.  Postsurgical changes related to prior CABG.  Stable right axillary stent.  IMPRESSION: Mild patchy opacity in the right upper and lower lobes, worrisome for multifocal pneumonia, less likely asymmetric interstitial edema.   Electronically Signed   By: Julian Hy M.D.   On: 10/24/2014 13:50   Dg Chest Port 1 View  10/23/2014   CLINICAL DATA:  80 year old with shortness of breath and cough  EXAM: PORTABLE CHEST - 1 VIEW  COMPARISON:  04/21/2014  FINDINGS: Median sternotomy wires, surgical clips and CABG markers are present. Metallic clips project in the left axilla. A vascular stent projects over the right shoulder.  The cardiac silhouette is enlarged. The mediastinal  contours are within normal limits. Atherosclerotic aortic calcifications are present.  Interstitial prominence is present in the lung bases, right slightly greater than left. There is no pleural effusion on the right. The left costophrenic angle is excluded from the field of view, limiting evaluation. There is no pneumothorax.  No acute osseous abnormality is identified.  IMPRESSION: 1. Mild cardiomegaly with probable mild  interstitial pulmonary edema. Interstitial pneumonitis could have a similar appearance. Clinical correlation is recommended. 2. Prior CABG.   Electronically Signed   By: Rosemarie Ax   On: 10/23/2014 12:09   Ct Extrem Lower W Cm Bil  10/30/2014   CLINICAL DATA:  He had short segment of nonfunctional left thigh graft removed on 10/01/2014. The area where the short segment of nonfunctional graft has had some drainage which was positive for MRSA. Up till now patient has been afebrile. Apparently had one temp to 101.3-axillary last p.m.On exam today the area where the segment of graft was removed is not inflamed and no evidence of proximal or distal cellulitis. However was able to compress the proximal segment of nonfunctional graft and had some purulent drainage.  EXAM: CT OF THE LOWER BILATERAL EXTREMITY WITH CONTRAST  TECHNIQUE: Multidetector CT imaging of the was performed according to the standard protocol following intravenous contrast administration.  COMPARISON:  None.  CONTRAST:  172mL OMNIPAQUE IOHEXOL 300 MG/ML  SOLN  FINDINGS: No acute fracture or dislocation. No lytic or blastic osseous lesion. No bone destruction. Tricompartmental osteoarthritis of the knees bilaterally. Large left knee joint effusion.  There is diverticulosis without evidence of diverticulitis. There is atherosclerotic disease involving bilateral common iliac arteries and external iliac arteries.  There is a left femoral dialysis graft which is patent. There is hyperdense fragments of a abandoned fistula graft in  the low left anterior thigh adjacent to the current non occluded graft. There is a small 11 mm hypodensity associated with the midportion of the abandoned left thigh graft just deep to the cutaneous surface which may represent a very small abscess versus low-density granulation material within the lumen. There is edema within the anterolateral aspect of the thigh within the subcutaneous fat. There is no focal drainable fluid collection.  There is an occluded right thigh AV fistula graft. There is peripheral vascular atherosclerotic disease.  IMPRESSION: 1. Patent left femoral AV fistula graft. Adjacently there is hyperdense fragments from an abandoned left anterior thigh AV fistula with a small 11 mm hypodensity associated with the midportion of the left thigh graft just deep to the dermis which may reflect a small abscess versus low-density material within the lumen. There is no focal fluid collection around be functioning left femoral AV fistula graft. There is no large drainable fluid collection. There is soft tissue edema in the anterolateral aspect of the left thigh which may be reactive versus secondary to mild cellulitis. 2. Tricompartmental osteoarthritis of bilateral knees. Large left knee joint effusion.   Electronically Signed   By: Kathreen Devoid   On: 10/30/2014 17:01     Today   Subjective:   Andrew [name] today has no headache,no chest abdominal pain,no new weakness tingling or numbness, feels much better wants to go home today.   Objective:   Blood pressure 102/52, pulse 89, temperature 97.4 F (36.3 C), temperature source Oral, resp. rate 29, height $RemoveBe'5\' 8"'qUmxyLRVh$  (1.727 m), weight 72.6 kg (160 lb 0.9 oz), SpO2 98 %.  Intake/Output Summary (Last 24 hours) at 11/19/14 1335 Last data filed at 11/19/14 1212  Gross per 24 hour  Intake    815 ml  Output   3000 ml  Net  -2185 ml    Exam Awake Alert, Oriented *3, No new F.N deficits, Normal affect [place].AT,PERRAL Supple Neck,No JVD, No cervical  lymphadenopathy appriciated.  Symmetrical Chest wall movement, Good air movement bilaterally, CTAB RRR,No Gallops,Rubs , 3/6 SEM, No Parasternal Heave +ve B.Sounds,  Abd Soft, Non tender, No organomegaly appriciated, No rebound -guarding or rigidity. No Cyanosis, Clubbing or edema, No new Rash or bruise  Data Review     CBC w Diff: Lab Results  Component Value Date   WBC 11.4* 11/19/2014   WBC 1.5* 11/17/2014   HGB 8.2* 11/19/2014   HGB 8.1* 11/17/2014   HCT 25.9* 11/19/2014   HCT 25.8* 11/17/2014   PLT 122* 11/19/2014   PLT 80* 11/17/2014   LYMPHOPCT 56.5* 11/17/2014   LYMPHOPCT 50* 11/04/2014   BANDSPCT 0 07/01/2013   MONOPCT 9.7 11/17/2014   MONOPCT 9 11/04/2014   EOSPCT 3.2 11/17/2014   EOSPCT 7* 11/04/2014   BASOPCT 0.6 11/17/2014   BASOPCT 1 11/04/2014   CMP: Lab Results  Component Value Date   NA 137 11/19/2014   NA 142 08/26/2013   K 6.2* 11/19/2014   K 4.1 08/26/2013   K 6.0 03/23/2011   CL 100 11/19/2014   CO2 28 11/19/2014   CO2 33* 08/26/2013   BUN 54* 11/19/2014   BUN 26.8* 08/26/2013   CREATININE 7.96* 11/19/2014   CREATININE 5.3* 08/26/2013   PROT 6.4 08/26/2013   PROT 5.5* 06/27/2013   ALBUMIN 3.0* 11/19/2014   ALBUMIN 3.1* 08/26/2013   BILITOT 0.63 08/26/2013   BILITOT 0.9 06/27/2013   ALKPHOS 130 08/26/2013   ALKPHOS 97 06/27/2013   ALKPHOS 103 03/23/2011   AST 29 08/26/2013   AST 31 06/27/2013   ALT 23 08/26/2013   ALT 18 06/27/2013  .  Micro Results Recent Results (from the past 240 hour(s))  MRSA PCR Screening     Status: None   Collection Time: 11/18/14  5:12 PM  Result Value Ref Range Status   MRSA by PCR NEGATIVE NEGATIVE Final    Comment:        The GeneXpert MRSA Assay (FDA approved for NASAL specimens only), is one component of a comprehensive MRSA colonization surveillance program. It is not intended to diagnose MRSA infection nor to guide or monitor treatment for MRSA infections.      Discharge Instructions       Follow-up Information    Follow up with [name]., MD. Schedule an appointment as soon as possible for a visit in 1 week.   Specialty:  Internal Medicine   Contact information:   [address] [place] [address] [phone]       Discharge Medications     Medication List    TAKE these medications        acetaminophen 500 MG tablet  Commonly known as:  TYLENOL  Take 500 mg by mouth every 6 (six) hours as needed. For pain/headache     albuterol 108 (90 BASE) MCG/ACT inhaler  Commonly known as:  PROVENTIL HFA;VENTOLIN HFA  Inhale 2 puffs into the lungs every 6 (six) hours as needed for wheezing or shortness of breath.     allopurinol 100 MG tablet  Commonly known as:  ZYLOPRIM  Take 100 mg by mouth 2 (two) times daily.     aspirin EC 81 MG tablet  Take 81 mg by mouth at bedtime.     calcium carbonate 500 MG chewable tablet  Commonly known as:  TUMS - dosed in mg elemental calcium  Chew 1 tablet by mouth daily as needed for heartburn. For heartburn     cinacalcet 30 MG tablet  Commonly known as:  SENSIPAR  Take 30 mg by mouth daily with breakfast.     colchicine 0.6 MG tablet  Take 0.6 mg by mouth 2 (two) times daily as needed (for gout).     DIALYVITE TABLET Tabs  Take 1 tablet by mouth at bedtime.     docusate sodium 100 MG capsule  Commonly known as:  COLACE  Take 100 mg by mouth daily as needed for mild constipation.     econazole nitrate 1 % cream  Apply 1 application topically daily as needed (Athletes foot).     gabapentin 100 MG capsule  Commonly known as:  NEURONTIN  Take 100-200 mg by mouth 2 (two) times daily. Takes 1 in the morning and 2 at night     GOLD BOND MAXIMUM RELIEF EX  Apply 1 application topically daily as needed (Itching).     GRANIX 480 MCG/0.8ML Sosy injection  Generic drug:  Tbo-Filgrastim  Inject 480 mcg into the skin once a week. Tuesdays     lubriderm seriously sensitive Lotn  Apply 1 application  topically daily.     midodrine 10 MG tablet  Commonly known as:  PROAMATINE  Take 10-20 mg by mouth See admin instructions. On Dialysis days (M,W,F), patient takes 2 tabs in the morning and 1 in the evening and on the other days patient takes 1 tablet in the morning and 2 in teh evening     MIRALAX powder  Generic drug:  polyethylene glycol powder  Take 17 g by mouth at bedtime.     oxyCODONE-acetaminophen 5-325 MG per tablet  Commonly known as:  PERCOCET/ROXICET  Take one tablet by mouth every 6 hours as needed for pain     sevelamer carbonate 800 MG tablet  Commonly known as:  RENVELA  Take 1,600-2,400 mg by mouth See admin instructions. Take 3 tabs with each meals and 2 tabs with snacks         Total Time in preparing paper work, data evaluation and todays exam - 35 minutes  Quynh Basso M.D on 11/19/2014 at 1:35 PM  [place]  [phone]

## 2014-11-19 NOTE — Progress Notes (Signed)
Discharge instructions given. Pt verbalized understanding and all questions were answered.  

## 2014-11-19 NOTE — Progress Notes (Signed)
CRITICAL VALUE ALERT  Critical value received: Potassium 6.2  Date of notification: 11-19-14  Time of notification:  0850  Critical value read back: yes  Nurse who received alert:  Ronny Bacon, RN  MD notified (1st page):  Dr. Justin Mend  Time of first page: Dr. Justin Mend present at time of value received.  MD notified (2nd page):  Time of second page:  Responding MD:  Dr. Justin Mend  Time MD responded:  [phone]

## 2014-11-19 NOTE — Procedures (Signed)
I have seen and examined this patient and agree with the plan of care . Patient seen on dilaysis  K 6.1  Change to 1 K bath   Remove fluid tolerating well    Hb 8.2  Transfuse 1 unit  Elayna Tobler W 11/19/2014, 8:54 AM

## 2014-11-20 LAB — TYPE AND SCREEN
ABO/RH(D): O POS
Antibody Screen: NEGATIVE
UNIT DIVISION: 0

## 2014-11-21 LAB — TYPE AND SCREEN
ABO/RH(D): O POS
Antibody Screen: NEGATIVE
Unit division: 0

## 2014-11-24 ENCOUNTER — Other Ambulatory Visit: Payer: Medicare Other

## 2014-11-24 ENCOUNTER — Ambulatory Visit (HOSPITAL_BASED_OUTPATIENT_CLINIC_OR_DEPARTMENT_OTHER): Payer: Medicare Other

## 2014-11-24 ENCOUNTER — Other Ambulatory Visit (HOSPITAL_BASED_OUTPATIENT_CLINIC_OR_DEPARTMENT_OTHER): Payer: Medicare Other

## 2014-11-24 ENCOUNTER — Ambulatory Visit (INDEPENDENT_AMBULATORY_CARE_PROVIDER_SITE_OTHER): Payer: Medicare Other | Admitting: Adult Health

## 2014-11-24 ENCOUNTER — Encounter: Payer: Self-pay | Admitting: Adult Health

## 2014-11-24 DIAGNOSIS — D630 Anemia in neoplastic disease: Secondary | ICD-10-CM

## 2014-11-24 DIAGNOSIS — D701 Agranulocytosis secondary to cancer chemotherapy: Secondary | ICD-10-CM

## 2014-11-24 DIAGNOSIS — D649 Anemia, unspecified: Secondary | ICD-10-CM

## 2014-11-24 DIAGNOSIS — T451X5A Adverse effect of antineoplastic and immunosuppressive drugs, initial encounter: Principal | ICD-10-CM

## 2014-11-24 DIAGNOSIS — D46C Myelodysplastic syndrome with isolated del(5q) chromosomal abnormality: Secondary | ICD-10-CM

## 2014-11-24 DIAGNOSIS — I5021 Acute systolic (congestive) heart failure: Secondary | ICD-10-CM

## 2014-11-24 DIAGNOSIS — I200 Unstable angina: Secondary | ICD-10-CM

## 2014-11-24 LAB — CBC WITH DIFFERENTIAL/PLATELET
BASO%: 2.1 % — AB (ref 0.0–2.0)
Basophils Absolute: 0 10*3/uL (ref 0.0–0.1)
EOS ABS: 0.1 10*3/uL (ref 0.0–0.5)
EOS%: 6.9 % (ref 0.0–7.0)
HCT: 31.3 % — ABNORMAL LOW (ref 38.4–49.9)
HGB: 9.9 g/dL — ABNORMAL LOW (ref 13.0–17.1)
LYMPH%: 51.2 % — AB (ref 14.0–49.0)
MCH: 32.5 pg (ref 27.2–33.4)
MCHC: 31.6 g/dL — ABNORMAL LOW (ref 32.0–36.0)
MCV: 102.8 fL — AB (ref 79.3–98.0)
MONO#: 0.2 10*3/uL (ref 0.1–0.9)
MONO%: 11.7 % (ref 0.0–14.0)
NEUT%: 28.1 % — ABNORMAL LOW (ref 39.0–75.0)
NEUTROS ABS: 0.4 10*3/uL — AB (ref 1.5–6.5)
PLATELETS: 82 10*3/uL — AB (ref 140–400)
RBC: 3.04 10*6/uL — ABNORMAL LOW (ref 4.20–5.82)
RDW: 24.1 % — AB (ref 11.0–14.6)
WBC: 1.4 10*3/uL — ABNORMAL LOW (ref 4.0–10.3)
lymph#: 0.7 10*3/uL — ABNORMAL LOW (ref 0.9–3.3)

## 2014-11-24 MED ORDER — NITROGLYCERIN 0.4 MG SL SUBL: 0.4000 mg | SUBLINGUAL_TABLET | SUBLINGUAL | Status: AC | PRN

## 2014-11-24 MED ORDER — TBO-FILGRASTIM 480 MCG/0.8ML ~~LOC~~ SOSY
480.0000 ug | PREFILLED_SYRINGE | Freq: Once | SUBCUTANEOUS | Status: AC
  Administered 2014-11-24: 480 ug via SUBCUTANEOUS
  Filled 2014-11-24: qty 0.8

## 2014-11-24 NOTE — Progress Notes (Signed)
HPI:Andrew [name] is a 80 y/o patient to be established with the [place] office we are seeing for ongoing assessment and management of A/C combined CHF. He was recently discharged from [facility] after admission for chest pain. Found to be non-cardiac. He was also found to be anemic and stated that he usually has chest discomfort when he needs a blood transfusion. He was diagnosed with demand ischemia. He received a blood transfusion and became symptom free after one unit of PRBC;s. He is followed by dialysis center for ESRD and dialysis MWF.   He comes today with no cardiac complaints. He is still mildly deconditioned. He uses a walker for ambulation. He is being seen by hematology Dr. Alvy Bimler at [facility] for ongoing management of anemia.   He complains of a rash in his groin area that is sometimes painful. Otherwise doing well. Daughter comes with him and is very attentive.   No Known Allergies  Current Outpatient Prescriptions  Medication Sig Dispense Refill  . acetaminophen (TYLENOL) 500 MG tablet Take 500 mg by mouth every 6 (six) hours as needed. For pain/headache    . albuterol (PROVENTIL HFA;VENTOLIN HFA) 108 (90 BASE) MCG/ACT inhaler Inhale 2 puffs into the lungs every 6 (six) hours as needed for wheezing or shortness of breath. 1 Inhaler 0  . allopurinol (ZYLOPRIM) 100 MG tablet Take 100 mg by mouth 2 (two) times daily.     Marland Kitchen aspirin EC 81 MG tablet Take 81 mg by mouth at bedtime.     . B Complex-C-Folic Acid (DIALYVITE TABLET) TABS Take 1 tablet by mouth at bedtime.     . calcium carbonate (TUMS - DOSED IN MG ELEMENTAL CALCIUM) 500 MG chewable tablet Chew 1 tablet by mouth daily as needed for heartburn. For heartburn    . cinacalcet (SENSIPAR) 30 MG tablet Take 30 mg by mouth daily with breakfast.     . colchicine 0.6 MG tablet Take 0.6 mg by mouth 2 (two) times daily as needed (for gout).    Marland Kitchen docusate sodium (COLACE) 100 MG capsule Take 100 mg by mouth daily as needed for mild  constipation.     Marland Kitchen econazole nitrate 1 % cream Apply 1 application topically daily as needed (Athletes foot).    . Emollient (LUBRIDERM SERIOUSLY SENSITIVE) LOTN Apply 1 application topically daily.    Marland Kitchen gabapentin (NEURONTIN) 100 MG capsule Take 100-200 mg by mouth 2 (two) times daily. Takes 1 in the morning and 2 at night    . midodrine (PROAMATINE) 10 MG tablet Take 10-20 mg by mouth See admin instructions. On Dialysis days (M,W,F), patient takes 2 tabs in the morning and 1 in the evening and on the other days patient takes 1 tablet in the morning and 2 in teh evening    . oxyCODONE-acetaminophen (PERCOCET/ROXICET) 5-325 MG per tablet Take one tablet by mouth every 6 hours as needed for pain 120 tablet 0  . polyethylene glycol powder (MIRALAX) powder Take 17 g by mouth at bedtime.     . Pramoxine-Menthol (GOLD BOND MAXIMUM RELIEF EX) Apply 1 application topically daily as needed (Itching).    . sevelamer (RENVELA) 800 MG tablet Take 1,600-2,400 mg by mouth See admin instructions. Take 3 tabs with each meals and 2 tabs with snacks    . Tbo-Filgrastim (GRANIX) 480 MCG/0.8ML SOSY injection Inject 480 mcg into the skin once a week. Tuesdays     No current facility-administered medications for this visit.   Facility-Administered Medications Ordered  in Other Visits  Medication Dose Route Frequency Provider Last Rate Last Dose  . methylPREDNISolone sodium succinate (SOLU-MEDROL) 125 mg/2 mL injection 60 mg  60 mg Intravenous Once Maree Krabbe, MD        Past Medical History  Diagnosis Date  . ESRD on hemodialysis     Hemodialysis since 2003; Occluded access in both upper extremities and the right lower extremity, current access as of Aug 2014 is L thigh AVG. Gets HD MWF at [facility].     . Duodenal ulcer     remote  . Chronic combined systolic and diastolic CHF (congestive heart failure)     a. 02/2012 Echo: EF 65%, basal infolat AK, mild conc LVH;  b. 06/2013 Echo: EF 40-45%, mild  LVH, Gr2 DD, basal inf HK->AK, Mod AS, Mild MR, PASP 73;  c. 09/2014 Echo: EF 45% basal-mid inflat and inf AK, Gr 2DD, mod AS.  Marland Kitchen Arteriosclerotic cardiovascular disease (ASCVD) 1998    a. [id number]; b. 06/2003 Cath: 4/4 patent grafts, EF 55%;  c. 09/2014 Cath: LM 99d, LADnl, LCX nl, RCA 100p, LIMA->LAD nl, VG->OM2->OM3 nl, VG->RPL nl, EF 40-45%.  . Cerebrovascular disease   . Neuropathy of foot   . Degenerative joint disease   . Gastroesophageal reflux disease   . Impaired hearing     Bilateral; hearing aids relatively ineffective  . Hypertension   . Cholelithiasis 03/2011    Diagnosed incidentally on abdominal ultrasound in 03/2011  . Hepatic steatosis   . Macrocytosis 10/20/2012  . Thrombocytopenia 10/20/2012  . Hyperlipidemia   . Secondary hyperparathyroidism   . Peripheral vascular disease   . Gout   . Moderate aortic stenosis     a. 06/2013 Echo: Mod AS, mean grad: 25, peak grad: 57;  b. 09/2014 Echo: EF 45%, Gr 2 DD, Mod AS, valve area 1.15cm^2 (VTI), 1.1cm^2 (Vmax).  . Pulmonary hypertension     a. 06/2013 Echo: PASP .  Marland Kitchen Anemia     Prior blood transfusion  . Complication of anesthesia     daughter reports long episodes confusion after amnesia meds  . Renal cell carcinoma 2001    s/p right nephrectomy-2001; subsequent ESRD  . 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 Hypotension   . Pancytopenia   . MRSA infection     a. 08/2014->10/2014 L thigh dialysis graft.  . Infection of AV graft for dialysis     a. Recurrent L thigh infxn s/p revision 10/1, I & D 11/28, and removal 11/01/2014.    Past Surgical History  Procedure Laterality Date  . Right nephrectomy  2001    Renal cell carcinoma  . Colonoscopy  01/24/2011    prominent vascular pattern, suboptimal prep but doable  . Esophagogastroduodenoscopy  01/24/2011    mild erosive reflux esophagitis, bulbar/antral erosions, bx from antrum benign  . Av fistula repair  2008    revision of anastomosis  of Right AVF  . Arteriovenous graft placement  2006    Thrombectomy and interposition jump graft revision to higher axillary vein of LUA AVG  . Coronary artery bypass graft  1998    X4 VESSELS  . Multiple cysto/ resection tumor's with bx's  LAST ONE 05-09-2011  . Multiple surg's / interventions for avgg/ fistula right upper arm  LAST REVISION 06-29-2011    (JUNE 2010 STENT CEPHALIC VEIN/ 03-23-2011 DILATATION ANGIOPLASTY)  . Left ptc left renal artery    . Cardiac catheterization  2004  . Cataract extraction w/  intraocular lens  implant, bilateral    . Cystoscopy w/ retrogrades  12/28/2011    Procedure: CYSTOSCOPY WITH RETROGRADE PYELOGRAM;  Surgeon: Malka So, MD;  Location[facility];  Service: Urology;  Laterality: Left;  C-ARM   . Transurethral resection of bladder tumor  12/28/2011    Procedure: TRANSURETHRAL RESECTION OF BLADDER TUMOR (TURBT);  Surgeon: Malka So, MD;  Location: [facility];  Service: Urology;  Laterality: N/A;  . Av fistula placement  02/29/2012    Procedure: INSERTION OF ARTERIOVENOUS (AV) GORE-TEX GRAFT THIGH;  Surgeon: Rosetta Posner, MD;  Location: [place];  Service: Vascular;  Laterality: Right;  Insertion right femoral arteriovenous gortex graft  . Ligation of right braciocephalic av fistula  [id number]  . Insertion of dialysis catheter  06/05/2012    Procedure: INSERTION OF DIALYSIS CATHETER;  Surgeon: Mal Misty, MD;  Location: [place];  Service: Vascular;  Laterality: Left;  Insertion diatek catheter left IJ  . Insertion of dialysis catheter  08/22/2012    Procedure: INSERTION OF DIALYSIS CATHETER;  Surgeon: Serafina Mitchell, MD;  Location: [facility];  Service: Vascular;  Laterality: Left;  insertion of dialysis catheter left  internal jugular 27cm  . Av fistula placement  10/08/2012    Procedure: INSERTION OF ARTERIOVENOUS (AV) GORE-TEX GRAFT THIGH;  Surgeon: Angelia Mould, MD;  Location: [place];  Service: Vascular;   Laterality: Left;  . Cystoscopy with biopsy N/A 08/07/2013    Procedure: CYSTOSCOPY WITH BLADDER BIOPSY;  Surgeon: Malka So, MD;  Location: [facility];  Service: Urology;  Laterality: N/A;  . Fulguration of bladder tumor N/A 08/07/2013    Procedure: FULGURATION OF BLADDER TUMOR;  Surgeon: Malka So, MD;  Location: [facility];  Service: Urology;  Laterality: N/A;  . Revision of arteriovenous goretex graft Left 08/27/2014    Procedure: THROMBECTOMY & REVISION OF LEFT ARM  ARTERIOVENOUS GORETEX GRAFT;  Surgeon: Angelia Mould, MD;  Location: [place];  Service: Vascular;  Laterality: Left;  . Revision of arteriovenous goretex graft Left 10/01/2014    Procedure: SEGMENT OF Left THIGH ARTERIOVENOUS GORETEX GRAFT Removed;  Surgeon: Mal Misty, MD;  Location: [place];  Service: Vascular;  Laterality: Left;  . Removal of graft Left 11/01/2014    Procedure: REMOVAL OF INFECTED LEFT THIGH GRAFT;  Surgeon: Serafina Mitchell, MD;  Location: [place] [facility];  Service: Vascular;  Laterality: Left;  removal of infected left dialysis graft.  . Left heart catheterization with coronary/graft angiogram N/A 10/27/2014    Procedure: LEFT HEART CATHETERIZATION WITH Beatrix Fetters;  Surgeon: Leonie Man, MD;  Location: [facility] CATH LAB;  Service: Cardiovascular;  Laterality: N/A;    ROS: Complete review of systems performed and found to be negative unless outlined above  PHYSICAL EXAM Ht $Remov'5\' 8"'[id number]$  (1.727 m)  Wt 171 lb (77.565 kg)  BMI 26.01 kg/m2 General: Well developed, well nourished, in no acute distress Head: Eyes PERRLA, No xanthomas.   Normal cephalic and atramatic  Lungs: Clear bilaterally to auscultation and percussion. Heart: HRRR S1 S2 occasional extra systole, with 1/6 systolic murmur, Pulses are 2+ & equal.            No carotid bruit. No JVD.  No abdominal bruits. No femoral bruits. Abdomen: Bowel sounds are positive, abdomen soft and non-tender without masses or  Hernia's noted. Msk:  Back normal, normal  gait. Normal strength and tone for age. Extremities: No clubbing, cyanosis or edema.  DP +1 Neuro: Alert and oriented X 3. Very HOH Psych:  Good affect, responds appropriately  ASSESSMENT AND PLAN

## 2014-11-24 NOTE — Assessment & Plan Note (Signed)
He is stable. Elevated troponin was related to demand ischemia. Once blood transfusion completed, he became asymptomatic.

## 2014-11-24 NOTE — Progress Notes (Signed)
Name: Andrew [name]    DOB: 03-29-1926  Age: 80 y.o.  MR#: [id number]       PCP:  [name]., MD      Insurance: Payor: MEDICARE / Plan: MEDICARE PART A AND B / Product Type: *No Product type* /   CC:    Chief Complaint  Patient presents with  . Coronary Artery Disease  . Hypertension    VS Filed Vitals:   11/24/14 1417  BP: 120/62  Pulse: 58  Height: 5\' 8"  (1.727 m)  Weight: 171 lb (77.565 kg)  SpO2: 99%    Weights Current Weight  11/24/14 171 lb (77.565 kg)  11/19/14 160 lb 0.9 oz (72.6 kg)  11/16/14 174 lb (78.926 kg)    Blood Pressure  BP Readings from Last 3 Encounters:  11/24/14 120/62  11/24/14 131/54  11/19/14 102/52     Admit date:  (Not on file) Last encounter with RMR:  Visit date not found   Allergy Review of patient's allergies indicates no known allergies.  Current Outpatient Prescriptions  Medication Sig Dispense Refill  . acetaminophen (TYLENOL) 500 MG tablet Take 500 mg by mouth every 6 (six) hours as needed. For pain/headache    . albuterol (PROVENTIL HFA;VENTOLIN HFA) 108 (90 BASE) MCG/ACT inhaler Inhale 2 puffs into the lungs every 6 (six) hours as needed for wheezing or shortness of breath. 1 Inhaler 0  . allopurinol (ZYLOPRIM) 100 MG tablet Take 100 mg by mouth 2 (two) times daily.     Marland Kitchen aspirin EC 81 MG tablet Take 81 mg by mouth at bedtime.     . B Complex-C-Folic Acid (DIALYVITE TABLET) TABS Take 1 tablet by mouth at bedtime.     . calcium carbonate (TUMS - DOSED IN MG ELEMENTAL CALCIUM) 500 MG chewable tablet Chew 1 tablet by mouth daily as needed for heartburn. For heartburn    . cinacalcet (SENSIPAR) 30 MG tablet Take 30 mg by mouth daily with breakfast.     . colchicine 0.6 MG tablet Take 0.6 mg by mouth 2 (two) times daily as needed (for gout).    Marland Kitchen docusate sodium (COLACE) 100 MG capsule Take 100 mg by mouth daily as needed for mild constipation.     Marland Kitchen econazole nitrate 1 % cream Apply 1 application topically daily as needed  (Athletes foot).    . Emollient (LUBRIDERM SERIOUSLY SENSITIVE) LOTN Apply 1 application topically daily.    Marland Kitchen gabapentin (NEURONTIN) 100 MG capsule Take 100-200 mg by mouth 2 (two) times daily. Takes 1 in the morning and 2 at night    . midodrine (PROAMATINE) 10 MG tablet Take 10-20 mg by mouth See admin instructions. On Dialysis days (M,W,F), patient takes 2 tabs in the morning and 1 in the evening and on the other days patient takes 1 tablet in the morning and 2 in teh evening    . oxyCODONE-acetaminophen (PERCOCET/ROXICET) 5-325 MG per tablet Take one tablet by mouth every 6 hours as needed for pain 120 tablet 0  . polyethylene glycol powder (MIRALAX) powder Take 17 g by mouth at bedtime.     . Pramoxine-Menthol (GOLD BOND MAXIMUM RELIEF EX) Apply 1 application topically daily as needed (Itching).    . sevelamer (RENVELA) 800 MG tablet Take 1,600-2,400 mg by mouth See admin instructions. Take 3 tabs with each meals and 2 tabs with snacks    . Tbo-Filgrastim (GRANIX) 480 MCG/0.8ML SOSY injection Inject 480 mcg into the skin once a week. Tuesdays  No current facility-administered medications for this visit.   Facility-Administered Medications Ordered in Other Visits  Medication Dose Route Frequency Provider Last Rate Last Dose  . methylPREDNISolone sodium succinate (SOLU-MEDROL) 125 mg/2 mL injection 60 mg  60 mg Intravenous Once Sol Blazing, MD        Discontinued Meds:   There are no discontinued medications.  Patient Active Problem List   Diagnosis Date Noted  . Chest pain 11/18/2014  . Vascular device, implant, or graft infection or inflammation 10/24/2014  . SOB (shortness of breath)   . ESRD on dialysis   . Hyperkalemia   . Unstable angina   . Aortic stenosis   . Coronary artery disease due to calcified coronary lesion   . Mechanical breakdown of surgically created arteriovenous shunt 08/27/2014  . End stage renal disease 08/27/2014  . Leukopenia due to antineoplastic  chemotherapy 06/09/2014  . Thrombocytopenia 05/07/2014  . 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 Acute systolic heart failure 92/92/4462  . CHF (congestive heart failure) 07/01/2013  . Other pancytopenia 10/20/2012  . CAD (coronary artery disease) 08/20/2012  . Peripheral vascular disease 08/06/2012  . Cerebrovascular disease 08/06/2012  . Peripheral neuropathy 01/09/2012  . Hypertension   . ESRD (end stage renal disease)   . Renal cell carcinoma   . Anemia in neoplastic disease 01/10/2011  . SECONDARY HYPERPARATHYROIDISM 05/26/2009    LABS    Component Value Date/Time   NA 137 11/19/2014 0813   NA 136 11/18/2014 1158   NA 137 11/04/2014 0402   NA 142 08/26/2013 1119   K 6.2* 11/19/2014 0813   K 5.8* 11/18/2014 1158   K 5.0 11/04/2014 0402   K 4.1 08/26/2013 1119   K 6.0 03/23/2011 0912   CL 100 11/19/2014 0813   CL 99 11/18/2014 1158   CL 97 11/04/2014 0402   CO2 28 11/19/2014 0813   CO2 27 11/18/2014 1158   CO2 22 11/04/2014 0402   CO2 33* 08/26/2013 1119   GLUCOSE 94 11/19/2014 0813   GLUCOSE 98 11/18/2014 1158   GLUCOSE 95 11/04/2014 0402   GLUCOSE 104 08/26/2013 1119   BUN 54* 11/19/2014 0813   BUN 44* 11/18/2014 1158   BUN 68* 11/04/2014 0402   BUN 26.8* 08/26/2013 1119   CREATININE 7.96* 11/19/2014 0813   CREATININE 6.69* 11/18/2014 1158   CREATININE 6.61* 11/04/2014 0402   CREATININE 5.3* 08/26/2013 1119   CALCIUM 8.1* 11/19/2014 0813   CALCIUM 8.4 11/18/2014 1158   CALCIUM 7.4* 11/04/2014 0402   CALCIUM 8.1* 08/26/2013 1119   CALCIUM 7.8* 03/13/2012 0527   GFRNONAA 5* 11/19/2014 0813   GFRNONAA 7* 11/18/2014 1158   GFRNONAA 7* 11/04/2014 0402   GFRAA 6* 11/19/2014 0813   GFRAA 8* 11/18/2014 1158   GFRAA 8* 11/04/2014 0402   CMP     Component Value Date/Time   NA 137 11/19/2014 0813   NA 142 08/26/2013 1119   K 6.2* 11/19/2014 0813   K 4.1 08/26/2013 1119   K 6.0 03/23/2011 0912   CL 100 11/19/2014 0813   CO2 28  11/19/2014 0813   CO2 33* 08/26/2013 1119   GLUCOSE 94 11/19/2014 0813   GLUCOSE 104 08/26/2013 1119   BUN 54* 11/19/2014 0813   BUN 26.8* 08/26/2013 1119   CREATININE 7.96* 11/19/2014 0813   CREATININE 5.3* 08/26/2013 1119   CALCIUM 8.1* 11/19/2014 0813   CALCIUM 8.1* 08/26/2013 1119   CALCIUM 7.8* 03/13/2012 0527   PROT 6.4 08/26/2013 1119  PROT 5.5* 06/27/2013 0540   ALBUMIN 3.0* 11/19/2014 0813   ALBUMIN 3.1* 08/26/2013 1119   AST 29 08/26/2013 1119   AST 31 06/27/2013 0540   ALT 23 08/26/2013 1119   ALT 18 06/27/2013 0540   ALKPHOS 130 08/26/2013 1119   ALKPHOS 97 06/27/2013 0540   ALKPHOS 103 03/23/2011 0912   BILITOT 0.63 08/26/2013 1119   BILITOT 0.9 06/27/2013 0540   GFRNONAA 5* 11/19/2014 0813   GFRAA 6* 11/19/2014 0813       Component Value Date/Time   WBC 1.4* 11/24/2014 1053   WBC 11.4* 11/19/2014 0813   WBC 7.9 11/18/2014 1158   WBC 1.5* 11/17/2014 1024   WBC 1.8* 11/10/2014 1158   WBC 2.1* 11/04/2014 0402   HGB 9.9* 11/24/2014 1053   HGB 8.2* 11/19/2014 0813   HGB 8.2* 11/18/2014 1158   HGB 8.1* 11/17/2014 1024   HGB 7.5* 11/10/2014 1158   HGB 8.7* 11/04/2014 0402   HCT 31.3* 11/24/2014 1053   HCT 25.9* 11/19/2014 0813   HCT 25.4* 11/18/2014 1158   HCT 25.8* 11/17/2014 1024   HCT 23.6* 11/10/2014 1158   HCT 26.0* 11/04/2014 0402   MCV 102.8* 11/24/2014 1053   MCV 103.6* 11/19/2014 0813   MCV 105.8* 11/18/2014 1158   MCV 102.8* 11/17/2014 1024   MCV 105.1* 11/10/2014 1158   MCV 104.4* 11/04/2014 0402   MCV 102 03/23/2011 0909    Lipid Panel     Component Value Date/Time   CHOL 103 03/11/2012 0530   TRIG 117 03/11/2012 0530   HDL 39* 03/11/2012 0530   CHOLHDL 2.6 03/11/2012 0530   VLDL 23 03/11/2012 0530   LDLCALC 41 03/11/2012 0530    ABG    Component Value Date/Time   PHART 7.331* 11/01/2014 2005   PCO2ART 50.6* 11/01/2014 2005   PO2ART 246.0* 11/01/2014 2005   HCO3 25.3* 11/01/2014 2005   TCO2 26.7 11/01/2014 2005   O2SAT  99.2 11/01/2014 2005     Lab Results  Component Value Date   TSH 2.600 06/27/2013   BNP (last 3 results)  Recent Labs  10/23/14 1117  PROBNP 30772.0*   Cardiac Panel (last 3 results) No results for input(s): CKTOTAL, CKMB, TROPONINI, RELINDX in the last 72 hours.  Iron/TIBC/Ferritin/ %Sat    Component Value Date/Time   IRON 223* 05/07/2014 1225   IRON 186* 06/30/2013 1200   TIBC 231 05/07/2014 1225   TIBC NOT CALC 06/30/2013 1200   FERRITIN 3,025 Result Confirmed by Automated Dilution.* 05/07/2014 1225   FERRITIN 1863* 06/30/2013 1200   FERRITIN 256.0 09/14/2011 1041   IRONPCTSAT 96* 05/07/2014 1225   IRONPCTSAT NOT CALC 06/30/2013 1200     EKG Orders placed or performed during the hospital encounter of 11/18/14  . ED EKG  . ED EKG  . EKG     Prior Assessment and Plan Problem List as of 11/24/2014      Cardiovascular and Mediastinum   Hypertension   Last Assessment & Plan   06/23/2013 Office Visit Written 06/23/2013 12:35 PM by Lendon Colonel, NP    Hypotensive today. Not on antihypertensives at this time. Actually on midodrine to be taken prior to dialysis. Anemia may be playing into this recording.    Peripheral vascular disease   Cerebrovascular disease   CAD (coronary artery disease)   Last Assessment & Plan   06/23/2013 Office Visit Written 06/23/2013 12:34 PM by Lendon Colonel, NP    Concerns that anemia may cause arrhythmia, Afib vs  ventricular irregularity. Will need to be watched for this. EKG NSR with PAC's. No complaints of chest pain. Will follow. No changes in medications. No planned testing.    Acute systolic heart failure   CHF (congestive heart failure)   Last Assessment & Plan   05/07/2014 Office Visit Written 05/07/2014  9:29 PM by Heath Lark, MD    Clinically, he appears euvolemic although he complained of shortness of breath and weakness which I suspect is due to anemia and diastolic heart dysfunction. Continue to monitor carefully.     Mechanical breakdown of surgically created arteriovenous shunt   Unstable angina   Aortic stenosis   Coronary artery disease due to calcified coronary lesion     Endocrine   SECONDARY HYPERPARATHYROIDISM     Nervous and Auditory   Peripheral neuropathy   Last Assessment & Plan   08/01/2012 Office Visit Written 08/01/2012 12:33 PM by Yehuda Savannah, MD    Occurrence of lower extremity pain in conjunction with dialysis and Trendelenburg positioning suggests a possible vascular etiology.  ABIs will be obtained.      Genitourinary   ESRD (end stage renal disease)   Last Assessment & Plan   07/21/2014 Office Visit Written 07/21/2014  8:17 PM by Heath Lark, MD    He will continue dialysis on Mondays, Wednesdays and Fridays.      Renal cell carcinoma   End stage renal disease   Last Assessment & Plan   10/20/2014 Office Visit Written 10/20/2014  9:22 PM by Heath Lark, MD    He will continue dialysis on Mondays, Wednesdays and Fridays. He is getting ESA through his nephrologist office     ESRD on dialysis     Hematopoietic and Hemostatic   Other pancytopenia     Other   Anemia in neoplastic disease   Last Assessment & Plan   10/20/2014 Office Visit Written 10/20/2014  9:22 PM by Heath Lark, MD    This is likely anemia of chronic disease. The patient denies recent history of bleeding such as epistaxis, hematuria or hematochezia. He is asymptomatic from the anemia. We will observe for now.  He does not require transfusion now.  We discussed establishing transfusion threshold to give him 1 unit of blood each time if his hemoglobin dropped to less than 8 g. He responded well 2 months ago to the addition of Granix. Due to recent MRSA infection, his blood count has dropped since. He will continue to hold Revlimid         MDS (myelodysplastic syndrome) with 5q deletion   Last Assessment & Plan   10/20/2014 Office Visit Written 10/20/2014  9:23 PM by Heath Lark, MD    Previously, he  had excellent response to Revlimid however it was discontinued due to profound pancytopenia and significant toxicity. I started him on only Revlimid 5 mg once a week but his treatment has to be put on hold due to severe pancytopenia. Subsequently, the dose of Revlimid was reduced at 2.5 mg once a week and he only got 2 doses before he got significantly pancytopenic again. The patient will return here on a weekly basis to get his blood work be checked. Once his [place] is greater than 1000, we will resume Revlimid at 2.5 mg once a week on Fridays after his dialysis.    Thrombocytopenia   Last Assessment & Plan   07/21/2014 Office Visit Written 07/21/2014  8:15 PM by Heath Lark, MD    This is due to  underlying bone marrow disorder and side effects of Revlimid. It is mild and slightly worse compared from previous platelet count. The patient denies recent history of bleeding such as epistaxis, hematuria or hematochezia. He is asymptomatic from the thrombocytopenia. I will observe for now.  he does not require transfusion now.           Leukopenia due to antineoplastic chemotherapy   Last Assessment & Plan   10/20/2014 Office Visit Written 10/20/2014  9:23 PM by Heath Lark, MD    This is likely due to recent treatment. I will observe for now.  We will continue to hold chemotherapy until [place] greater than 1000. The risk, benefits, side effects of G-CSF was discussed extensively with her daughter.      Vascular device, implant, or graft infection or inflammation   SOB (shortness of breath)   Hyperkalemia   Chest pain       Imaging: Dg Chest 2 View  11/07/2014   CLINICAL DATA:  80 year old male with cough and positive PPD the  EXAM: CHEST  2 VIEW  COMPARISON:  Prior chest x-ray 10/29/2014  FINDINGS: Stable mild cardiomegaly. Patient is status post median sternotomy with evidence of multivessel CABG. Atherosclerotic calcification present in the transverse aorta. No focal airspace consolidation,  pulmonary edema, pleural effusion or pneumothorax. Stable bronchitic change and a mild prominence of the interstitial markings. Similar configuration of right axillary vein flare stent graft with possible fracture versus infolding inferiorly. No acute osseous abnormality.  IMPRESSION: Stable chest x-ray without evidence of acute cardiopulmonary process.   Electronically Signed   By: Jacqulynn Cadet M.D.   On: 11/07/2014 09:55   Dg Chest 2 View  10/29/2014   CLINICAL DATA:  Fever 10/24/2014  EXAM: CHEST  2 VIEW  COMPARISON:  None.  FINDINGS: Interval improvement in aeration bilaterally. Resolution of previously noted infiltrate or edema on the right. Vascularity has improved and is now normal. Lungs are clear and there is no effusion. Right axillary stent with kinking unchanged.  IMPRESSION: Improved aeration bilaterally which may be due to clearing edema or pneumonia. Lungs are now clear.   Electronically Signed   By: Franchot Gallo M.D.   On: 10/29/2014 09:04   [place] Extrem Low Left Ltd  10/29/2014   CLINICAL DATA:  Patient with history of revised left thigh dialysis graft, now with focal area of erythema. Evaluate for abscess.  EXAM: ULTRASOUND LEFT LOWER EXTREMITY LIMITED  TECHNIQUE: Ultrasound examination of the lower extremity soft tissues was performed in the area of clinical concern.  COMPARISON:  None  FINDINGS: Multiple grayscale and color flow images were provided of the patient's left upper thigh at the area of concern. Additionally, real-time sonographic evaluation was performed by the dictating radiologist.  Acquired cinegraphic images demonstrate that the focal area of erythema within the patient's upper medial thigh corresponds with focal dermal erosion superficial to the abandoned medial thigh dialysis graft. There is no associated organized fluid collection or abscess. This focal area of erythema is remote from the patient's functioning left thigh dialysis graft.  The lateral aspect of the  patient's functioning left upper thigh dialysis graft appears widely patent where imaged.  IMPRESSION: 1. Focal area of erythema involving the left thigh corresponds to a focal area of dermal erosion superficial to the patient's abandoned left thigh dialysis graft. No associated organized fluid collection or abscess. 2. The lateral aspect of the patient's functioning left thigh graft appears widely patent were imaged.   Electronically Signed  By: Sandi Mariscal M.D.   On: 10/29/2014 16:25   Dg Chest Port 1 View  11/18/2014   CLINICAL DATA:  Chest pain, tightness  EXAM: PORTABLE CHEST - 1 VIEW  COMPARISON:  11/07/2014  FINDINGS: There are bilateral interstitial and alveolar airspace opacities. There is no significant pleural effusion or pneumothorax. There is mild stable cardiomegaly. There is evidence of prior CABG. There is no acute osseous abnormality.  IMPRESSION: Overall appearance concerning for mild pulmonary edema.   Electronically Signed   By: Kathreen Devoid   On: 11/18/2014 12:39   Ct Extrem Lower W Cm Bil  10/30/2014   CLINICAL DATA:  He had short segment of nonfunctional left thigh graft removed on 10/01/2014. The area where the short segment of nonfunctional graft has had some drainage which was positive for MRSA. Up till now patient has been afebrile. Apparently had one temp to 101.3-axillary last p.m.On exam today the area where the segment of graft was removed is not inflamed and no evidence of proximal or distal cellulitis. However was able to compress the proximal segment of nonfunctional graft and had some purulent drainage.  EXAM: CT OF THE LOWER BILATERAL EXTREMITY WITH CONTRAST  TECHNIQUE: Multidetector CT imaging of the was performed according to the standard protocol following intravenous contrast administration.  COMPARISON:  None.  CONTRAST:  171mL OMNIPAQUE IOHEXOL 300 MG/ML  SOLN  FINDINGS: No acute fracture or dislocation. No lytic or blastic osseous lesion. No bone destruction.  Tricompartmental osteoarthritis of the knees bilaterally. Large left knee joint effusion.  There is diverticulosis without evidence of diverticulitis. There is atherosclerotic disease involving bilateral common iliac arteries and external iliac arteries.  There is a left femoral dialysis graft which is patent. There is hyperdense fragments of a abandoned fistula graft in the low left anterior thigh adjacent to the current non occluded graft. There is a small 11 mm hypodensity associated with the midportion of the abandoned left thigh graft just deep to the cutaneous surface which may represent a very small abscess versus low-density granulation material within the lumen. There is edema within the anterolateral aspect of the thigh within the subcutaneous fat. There is no focal drainable fluid collection.  There is an occluded right thigh AV fistula graft. There is peripheral vascular atherosclerotic disease.  IMPRESSION: 1. Patent left femoral AV fistula graft. Adjacently there is hyperdense fragments from an abandoned left anterior thigh AV fistula with a small 11 mm hypodensity associated with the midportion of the left thigh graft just deep to the dermis which may reflect a small abscess versus low-density material within the lumen. There is no focal fluid collection around be functioning left femoral AV fistula graft. There is no large drainable fluid collection. There is soft tissue edema in the anterolateral aspect of the left thigh which may be reactive versus secondary to mild cellulitis. 2. Tricompartmental osteoarthritis of bilateral knees. Large left knee joint effusion.   Electronically Signed   By: Kathreen Devoid   On: 10/30/2014 17:01

## 2014-11-24 NOTE — Patient Instructions (Addendum)
Your physician recommends that you schedule a follow-up appointment in: 3 months  Your physician recommends that you continue on your current medications as directed. Please refer to the Current Medication list given to you today.  Start Nitroglycerin   Thank you for choosing [facility]!

## 2014-11-24 NOTE — Assessment & Plan Note (Signed)
He is without evidence of fluid overload. His breathing is stable. Will continue current medication regimen. He will need to be established with Dr. Harl Bowie or Dr. Bronson Ing on next visit.

## 2014-11-24 NOTE — Assessment & Plan Note (Signed)
BP is well controlled. No changes in medications.

## 2014-11-25 NOTE — Progress Notes (Signed)
UR Completed Analilia Geddis Graves-Bigelow, RN,BSN [phone]  

## 2014-11-27 ENCOUNTER — Inpatient Hospital Stay (HOSPITAL_COMMUNITY)
Admission: EM | Admit: 2014-11-27 | Discharge: 2014-11-30 | DRG: 291 | Disposition: A | Discharge status: Home / Self Care | Payer: Medicare Other | Attending: Internal Medicine | Admitting: Internal Medicine

## 2014-11-27 ENCOUNTER — Encounter (HOSPITAL_COMMUNITY): Payer: Self-pay | Admitting: Emergency Medicine

## 2014-11-27 ENCOUNTER — Emergency Department (HOSPITAL_COMMUNITY): Payer: Medicare Other

## 2014-11-27 ENCOUNTER — Other Ambulatory Visit: Payer: Self-pay

## 2014-11-27 DIAGNOSIS — D638 Anemia in other chronic diseases classified elsewhere: Secondary | ICD-10-CM | POA: Diagnosis present

## 2014-11-27 DIAGNOSIS — M109 Gout, unspecified: Secondary | ICD-10-CM | POA: Diagnosis present

## 2014-11-27 DIAGNOSIS — Z8673 Personal history of transient ischemic attack (TIA), and cerebral infarction without residual deficits: Secondary | ICD-10-CM | POA: Diagnosis not present

## 2014-11-27 DIAGNOSIS — I2510 Atherosclerotic heart disease of native coronary artery without angina pectoris: Secondary | ICD-10-CM | POA: Diagnosis present

## 2014-11-27 DIAGNOSIS — Z992 Dependence on renal dialysis: Secondary | ICD-10-CM | POA: Diagnosis not present

## 2014-11-27 DIAGNOSIS — I350 Nonrheumatic aortic (valve) stenosis: Secondary | ICD-10-CM | POA: Diagnosis present

## 2014-11-27 DIAGNOSIS — Z951 Presence of aortocoronary bypass graft: Secondary | ICD-10-CM | POA: Diagnosis not present

## 2014-11-27 DIAGNOSIS — I5043 Acute on chronic combined systolic (congestive) and diastolic (congestive) heart failure: Secondary | ICD-10-CM | POA: Diagnosis present

## 2014-11-27 DIAGNOSIS — I120 Hypertensive chronic kidney disease with stage 5 chronic kidney disease or end stage renal disease: Secondary | ICD-10-CM | POA: Diagnosis present

## 2014-11-27 DIAGNOSIS — I272 Other secondary pulmonary hypertension: Secondary | ICD-10-CM | POA: Diagnosis present

## 2014-11-27 DIAGNOSIS — Z9842 Cataract extraction status, left eye: Secondary | ICD-10-CM

## 2014-11-27 DIAGNOSIS — K219 Gastro-esophageal reflux disease without esophagitis: Secondary | ICD-10-CM | POA: Diagnosis present

## 2014-11-27 DIAGNOSIS — Z87891 Personal history of nicotine dependence: Secondary | ICD-10-CM | POA: Diagnosis not present

## 2014-11-27 DIAGNOSIS — I739 Peripheral vascular disease, unspecified: Secondary | ICD-10-CM | POA: Diagnosis present

## 2014-11-27 DIAGNOSIS — G629 Polyneuropathy, unspecified: Secondary | ICD-10-CM | POA: Diagnosis present

## 2014-11-27 DIAGNOSIS — I509 Heart failure, unspecified: Secondary | ICD-10-CM

## 2014-11-27 DIAGNOSIS — Z7982 Long term (current) use of aspirin: Secondary | ICD-10-CM | POA: Diagnosis not present

## 2014-11-27 DIAGNOSIS — N2581 Secondary hyperparathyroidism of renal origin: Secondary | ICD-10-CM | POA: Diagnosis present

## 2014-11-27 DIAGNOSIS — D469 Myelodysplastic syndrome, unspecified: Secondary | ICD-10-CM | POA: Diagnosis present

## 2014-11-27 DIAGNOSIS — E785 Hyperlipidemia, unspecified: Secondary | ICD-10-CM | POA: Diagnosis present

## 2014-11-27 DIAGNOSIS — D630 Anemia in neoplastic disease: Secondary | ICD-10-CM | POA: Diagnosis present

## 2014-11-27 DIAGNOSIS — N186 End stage renal disease: Secondary | ICD-10-CM | POA: Diagnosis present

## 2014-11-27 DIAGNOSIS — Z9841 Cataract extraction status, right eye: Secondary | ICD-10-CM | POA: Diagnosis not present

## 2014-11-27 DIAGNOSIS — R0600 Dyspnea, unspecified: Secondary | ICD-10-CM | POA: Diagnosis present

## 2014-11-27 DIAGNOSIS — I9589 Other hypotension: Secondary | ICD-10-CM | POA: Diagnosis present

## 2014-11-27 LAB — URINALYSIS, ROUTINE W REFLEX MICROSCOPIC
Bilirubin Urine: NEGATIVE
Glucose, UA: NEGATIVE mg/dL
KETONES UR: NEGATIVE mg/dL
NITRITE: NEGATIVE
Protein, ur: 30 mg/dL — AB
Specific Gravity, Urine: 1.01 (ref 1.005–1.030)
UROBILINOGEN UA: 0.2 mg/dL (ref 0.0–1.0)
pH: 7.5 (ref 5.0–8.0)

## 2014-11-27 LAB — URINE MICROSCOPIC-ADD ON

## 2014-11-27 LAB — CBC WITH DIFFERENTIAL/PLATELET
BASOS PCT: 0 % (ref 0–1)
Basophils Absolute: 0 10*3/uL (ref 0.0–0.1)
EOS ABS: 0.1 10*3/uL (ref 0.0–0.7)
Eosinophils Relative: 2 % (ref 0–5)
HCT: 27.3 % — ABNORMAL LOW (ref 39.0–52.0)
HEMOGLOBIN: 8.8 g/dL — AB (ref 13.0–17.0)
LYMPHS ABS: 1.3 10*3/uL (ref 0.7–4.0)
Lymphocytes Relative: 25 % (ref 12–46)
MCH: 33.7 pg (ref 26.0–34.0)
MCHC: 32.2 g/dL (ref 30.0–36.0)
MCV: 104.6 fL — AB (ref 78.0–100.0)
MONO ABS: 0.3 10*3/uL (ref 0.1–1.0)
Monocytes Relative: 5 % (ref 3–12)
Neutro Abs: 3.3 10*3/uL (ref 1.7–7.7)
Neutrophils Relative %: 68 % (ref 43–77)
PLATELETS: 90 10*3/uL — AB (ref 150–400)
RBC: 2.61 MIL/uL — ABNORMAL LOW (ref 4.22–5.81)
RDW: 22.3 % — ABNORMAL HIGH (ref 11.5–15.5)
WBC: 5 10*3/uL (ref 4.0–10.5)

## 2014-11-27 LAB — CBC
HEMATOCRIT: 25.9 % — AB (ref 39.0–52.0)
Hemoglobin: 8.5 g/dL — ABNORMAL LOW (ref 13.0–17.0)
MCH: 34.3 pg — AB (ref 26.0–34.0)
MCHC: 32.8 g/dL (ref 30.0–36.0)
MCV: 104.4 fL — ABNORMAL HIGH (ref 78.0–100.0)
Platelets: 101 10*3/uL — ABNORMAL LOW (ref 150–400)
RBC: 2.48 MIL/uL — AB (ref 4.22–5.81)
RDW: 22.5 % — ABNORMAL HIGH (ref 11.5–15.5)
WBC: 3.4 10*3/uL — ABNORMAL LOW (ref 4.0–10.5)

## 2014-11-27 LAB — COMPREHENSIVE METABOLIC PANEL
ALT: 32 U/L (ref 0–53)
ANION GAP: 11 (ref 5–15)
AST: 38 U/L — AB (ref 0–37)
Albumin: 3.1 g/dL — ABNORMAL LOW (ref 3.5–5.2)
Alkaline Phosphatase: 146 U/L — ABNORMAL HIGH (ref 39–117)
BILIRUBIN TOTAL: 0.5 mg/dL (ref 0.3–1.2)
BUN: 30 mg/dL — AB (ref 6–23)
CALCIUM: 8.4 mg/dL (ref 8.4–10.5)
CHLORIDE: 97 meq/L (ref 96–112)
CO2: 28 mmol/L (ref 19–32)
CREATININE: 6.08 mg/dL — AB (ref 0.50–1.35)
GFR calc non Af Amer: 7 mL/min — ABNORMAL LOW (ref >90)
GFR, EST AFRICAN AMERICAN: 9 mL/min — AB (ref >90)
GLUCOSE: 101 mg/dL — AB (ref 70–99)
Potassium: 4 mmol/L (ref 3.5–5.1)
Sodium: 136 mmol/L (ref 135–145)
Total Protein: 6.3 g/dL (ref 6.0–8.3)

## 2014-11-27 LAB — BRAIN NATRIURETIC PEPTIDE: B Natriuretic Peptide: 2150.3 pg/mL — ABNORMAL HIGH (ref 0.0–100.0)

## 2014-11-27 LAB — TROPONIN I
TROPONIN I: 0.07 ng/mL — AB (ref <0.031)
Troponin I: 0.23 ng/mL — ABNORMAL HIGH (ref <0.031)
Troponin I: 0.22 ng/mL — ABNORMAL HIGH (ref <0.031)

## 2014-11-27 LAB — MRSA PCR SCREENING: MRSA BY PCR: NEGATIVE

## 2014-11-27 MED ORDER — SEVELAMER CARBONATE 800 MG PO TABS
1600.0000 mg | ORAL_TABLET | ORAL | Status: DC
  Filled 2014-11-27 (×2): qty 2

## 2014-11-27 MED ORDER — MIDODRINE HCL 5 MG PO TABS
10.0000 mg | ORAL_TABLET | Freq: Once | ORAL | Status: DC
  Filled 2014-11-27: qty 2

## 2014-11-27 MED ORDER — MIDODRINE HCL 5 MG PO TABS: 10.0000 mg | ORAL_TABLET | ORAL | Status: DC

## 2014-11-27 MED ORDER — POLYETHYLENE GLYCOL 3350 17 G PO PACK
17.0000 g | PACK | Freq: Every day | ORAL | Status: DC
  Administered 2014-11-27 – 2014-11-30 (×4): 17 g via ORAL
  Filled 2014-11-27 (×4): qty 1

## 2014-11-27 MED ORDER — IPRATROPIUM-ALBUTEROL 0.5-2.5 (3) MG/3ML IN SOLN
3.0000 mL | Freq: Once | RESPIRATORY_TRACT | Status: AC
  Administered 2014-11-27: 3 mL via RESPIRATORY_TRACT

## 2014-11-27 MED ORDER — MIDODRINE HCL 5 MG PO TABS
10.0000 mg | ORAL_TABLET | ORAL | Status: DC
  Filled 2014-11-27: qty 2

## 2014-11-27 MED ORDER — DOCUSATE SODIUM 100 MG PO CAPS
100.0000 mg | ORAL_CAPSULE | Freq: Every day | ORAL | Status: DC | PRN
  Filled 2014-11-27: qty 1

## 2014-11-27 MED ORDER — SEVELAMER CARBONATE 800 MG PO TABS
800.0000 mg | ORAL_TABLET | Freq: Two times a day (BID) | ORAL | Status: DC | PRN
  Administered 2014-11-27: 800 mg via ORAL
  Filled 2014-11-27 (×2): qty 1

## 2014-11-27 MED ORDER — COLCHICINE 0.6 MG PO TABS
0.6000 mg | ORAL_TABLET | Freq: Two times a day (BID) | ORAL | Status: DC | PRN
  Filled 2014-11-27: qty 1

## 2014-11-27 MED ORDER — CALCIUM CARBONATE ANTACID 500 MG PO CHEW
1.0000 | CHEWABLE_TABLET | Freq: Every day | ORAL | Status: DC | PRN
  Filled 2014-11-27: qty 1

## 2014-11-27 MED ORDER — NITROGLYCERIN 0.4 MG SL SUBL: 0.4000 mg | SUBLINGUAL_TABLET | SUBLINGUAL | Status: DC | PRN

## 2014-11-27 MED ORDER — MIDODRINE HCL 5 MG PO TABS
20.0000 mg | ORAL_TABLET | Freq: Once | ORAL | Status: AC
  Administered 2014-11-28: 20 mg via ORAL
  Filled 2014-11-27: qty 4

## 2014-11-27 MED ORDER — CINACALCET HCL 30 MG PO TABS
30.0000 mg | ORAL_TABLET | Freq: Every day | ORAL | Status: DC
  Administered 2014-11-28 – 2014-11-30 (×3): 30 mg via ORAL
  Filled 2014-11-27 (×5): qty 1

## 2014-11-27 MED ORDER — HEPARIN SODIUM (PORCINE) 5000 UNIT/ML IJ SOLN
5000.0000 [IU] | Freq: Three times a day (TID) | INTRAMUSCULAR | Status: DC
  Administered 2014-11-27 – 2014-11-30 (×9): 5000 [IU] via SUBCUTANEOUS
  Filled 2014-11-27 (×12): qty 1

## 2014-11-27 MED ORDER — MIDODRINE HCL 5 MG PO TABS: 20.0000 mg | ORAL_TABLET | ORAL | Status: DC

## 2014-11-27 MED ORDER — TBO-FILGRASTIM 480 MCG/0.8ML ~~LOC~~ SOSY: 480.0000 ug | PREFILLED_SYRINGE | SUBCUTANEOUS | Status: DC

## 2014-11-27 MED ORDER — RENA-VITE PO TABS
1.0000 | ORAL_TABLET | Freq: Every day | ORAL | Status: DC
  Administered 2014-11-27 – 2014-11-29 (×3): 1 via ORAL
  Filled 2014-11-27 (×4): qty 1

## 2014-11-27 MED ORDER — POLYETHYLENE GLYCOL 3350 17 GM/SCOOP PO POWD
17.0000 g | Freq: Every day | ORAL | Status: DC
  Filled 2014-11-27: qty 255

## 2014-11-27 MED ORDER — SEVELAMER CARBONATE 800 MG PO TABS: 1600.0000 mg | ORAL_TABLET | ORAL | Status: DC

## 2014-11-27 MED ORDER — ACETAMINOPHEN 500 MG PO TABS
500.0000 mg | ORAL_TABLET | Freq: Four times a day (QID) | ORAL | Status: DC | PRN
  Administered 2014-11-27: 500 mg via ORAL
  Filled 2014-11-27: qty 1

## 2014-11-27 MED ORDER — MIDODRINE HCL 5 MG PO TABS
20.0000 mg | ORAL_TABLET | Freq: Once | ORAL | Status: AC
  Administered 2014-11-27: 20 mg via ORAL
  Filled 2014-11-27: qty 4

## 2014-11-27 MED ORDER — ASPIRIN EC 81 MG PO TBEC
81.0000 mg | DELAYED_RELEASE_TABLET | Freq: Every day | ORAL | Status: DC
  Administered 2014-11-27 – 2014-11-29 (×3): 81 mg via ORAL
  Filled 2014-11-27 (×4): qty 1

## 2014-11-27 MED ORDER — MIDODRINE HCL 5 MG PO TABS
20.0000 mg | ORAL_TABLET | ORAL | Status: DC
  Filled 2014-11-27: qty 4

## 2014-11-27 MED ORDER — GABAPENTIN 100 MG PO CAPS
100.0000 mg | ORAL_CAPSULE | Freq: Two times a day (BID) | ORAL | Status: DC
  Filled 2014-11-27 (×2): qty 2

## 2014-11-27 MED ORDER — SEVELAMER CARBONATE 800 MG PO TABS
1600.0000 mg | ORAL_TABLET | Freq: Two times a day (BID) | ORAL | Status: DC | PRN
  Filled 2014-11-27: qty 2

## 2014-11-27 MED ORDER — MIDODRINE HCL 5 MG PO TABS
20.0000 mg | ORAL_TABLET | Freq: Once | ORAL | Status: DC
  Filled 2014-11-27: qty 4

## 2014-11-27 MED ORDER — SEVELAMER CARBONATE 800 MG PO TABS
1600.0000 mg | ORAL_TABLET | Freq: Three times a day (TID) | ORAL | Status: DC
  Administered 2014-11-27 – 2014-11-30 (×9): 1600 mg via ORAL
  Filled 2014-11-27 (×12): qty 2

## 2014-11-27 MED ORDER — GABAPENTIN 100 MG PO CAPS
200.0000 mg | ORAL_CAPSULE | Freq: Every day | ORAL | Status: DC
  Administered 2014-11-27 – 2014-11-29 (×3): 200 mg via ORAL
  Filled 2014-11-27 (×4): qty 2

## 2014-11-27 MED ORDER — SEVELAMER CARBONATE 800 MG PO TABS
2400.0000 mg | ORAL_TABLET | Freq: Three times a day (TID) | ORAL | Status: DC
  Filled 2014-11-27 (×2): qty 3

## 2014-11-27 MED ORDER — SODIUM CHLORIDE 0.9 % IV SOLN: 250.0000 mL | INTRAVENOUS | Status: DC | PRN

## 2014-11-27 MED ORDER — GABAPENTIN 100 MG PO CAPS
100.0000 mg | ORAL_CAPSULE | Freq: Every day | ORAL | Status: DC
  Administered 2014-11-27 – 2014-11-30 (×4): 100 mg via ORAL
  Filled 2014-11-27 (×4): qty 1

## 2014-11-27 MED ORDER — OXYCODONE-ACETAMINOPHEN 5-325 MG PO TABS: 1.0000 | ORAL_TABLET | Freq: Four times a day (QID) | ORAL | Status: DC | PRN

## 2014-11-27 MED ORDER — ALLOPURINOL 100 MG PO TABS
100.0000 mg | ORAL_TABLET | Freq: Two times a day (BID) | ORAL | Status: DC
  Administered 2014-11-27 – 2014-11-30 (×7): 100 mg via ORAL
  Filled 2014-11-27 (×8): qty 1

## 2014-11-27 NOTE — Consult Note (Signed)
WOC wound consult note Reason for Consult: At risk sacral area and left lateral thigh full thickness wound Wound type: Healing chronic wound resulting from graft procedure several weeks ago; surgical Pressure Ulcer POA: No Measurement: 6.5cm x 1.5cm x 0.8cm Wound RFX:JOITG, pink, moist, granulating. Evidence of wound contraction at periphery Drainage (amount, consistency, odor) scant serous, no odor Periwound:intact, early maceration Dressing procedure/placement/frequency:I will continue the wound care that is being conducted at home, i.e., hydrogel dressing daily.   I have provided guidance for the bedside nursing staff on this intervention directed toward moisture retention. The patient's sacral area is intact, but risk for skin breakdown is acknowledged.  I have instructed the patient on the importance of turning and repositioning side to side while in bed and avoiding the supine position.  I have provided a pressure redistribution chair pad for his use when OOB in the chair.  Patient and visitor (daughter/care provider at home) indicate appreciation and understanding. [place] nursing team will not follow, but will remain available to this patient, the nursing and medical teams.  Please re-consult if needed. Thanks, Maudie Flakes, MSN, RN, [place], [place], [place] [phone])

## 2014-11-27 NOTE — H&P (Signed)
History and Physical  Andrew [name] OPF:[id number] DOB: 20-Jun-1926 DOA: 11/27/2014  PCP: [name]., MD   Chief Complaint: dysnpnea  HPI: 81 year old male with ESRD on hemodialysis MWF, myelodysplastic syndrome, infected left high AVG s/p removal (Dr. Trula Slade) who presented to ED with c/o dyspnea and chest pain, similar to his admission 12/23 - 11/19/2014.  He denies any recent change in his medication regimen, or missing his scheduled HD appointments, but does admit to having "a little holiday food" outside of his restrictive renal diet.  Of note he states that his sx did improve markedly at one point at home w/ use of a SL nitro.      Assessment/Plan  ESRD on HD Denies missing any of his HD appointments - typically on M/W/F schedule, such that will be due to HD Sat per Holiday schedule - mildly volume overloaded on physical exam, but not emergently so - message left w/ [facility] voicemail 11/27/2014  Chronic combined systolic and diastolic CHF EF 38% w/ grade 2 DD via TTE 10/24/14 - volume management per HD - mild acute exacerbation likely due to modest dietary indiscretion - weight at time of recent D/C ~72kg > presently at 77kg  CAD s/p CABG 1998 cath in Nov 2015 revealing 4/4 patent grafts - trop peaked at 0.15 during recent admit - was seen by Dr. Gwenlyn Found 11/18/14 during prior hospitalization - had cardiac cath as recently as Nov revealing patent bypass grafts - hx is not convincing of USAP at present - follow clinically at this time   PVD Clinically stable at this time   HTN BP stable currently   Myelodysplastic Syndrome / Pancytopenia Followed by Dr Alvy Bimler - counts currently stable   Mod AoS Contributing to propensity for rapid onset of dyspnea - volume management per HD   Pulm HTN  HLD  MRSA infected left thigh dialysis graft status post removal, 09/2014 No evidence of complications at this time   Code Status: FULL DVT Prophylaxis: SQ heparin  Family  Communication: spoke w/ daughter at bedside   Disposition Plan: admit to tele bed   Past Medical History  Diagnosis Date  . ESRD on hemodialysis     Hemodialysis since 2003; Occluded access in both upper extremities and the right lower extremity, current access as of Aug 2014 is L thigh AVG. Gets HD MWF at [facility].     . Duodenal ulcer     remote  . Chronic combined systolic and diastolic CHF (congestive heart failure)     a. 02/2012 Echo: EF 65%, basal infolat AK, mild conc LVH;  b. 06/2013 Echo: EF 40-45%, mild LVH, Gr2 DD, basal inf HK->AK, Mod AS, Mild MR, PASP 73;  c. 09/2014 Echo: EF 45% basal-mid inflat and inf AK, Gr 2DD, mod AS.  Marland Kitchen Arteriosclerotic cardiovascular disease (ASCVD) 1998    a. [id number]; b. 06/2003 Cath: 4/4 patent grafts, EF 55%;  c. 09/2014 Cath: LM 99d, LADnl, LCX nl, RCA 100p, LIMA->LAD nl, VG->OM2->OM3 nl, VG->RPL nl, EF 40-45%.  . Cerebrovascular disease   . Neuropathy of foot   . Degenerative joint disease   . Gastroesophageal reflux disease   . Impaired hearing     Bilateral; hearing aids relatively ineffective  . Hypertension   . Cholelithiasis 03/2011    Diagnosed incidentally on abdominal ultrasound in 03/2011  . Hepatic steatosis   . Macrocytosis 10/20/2012  . Thrombocytopenia 10/20/2012  . Hyperlipidemia   . Secondary hyperparathyroidism   . Peripheral vascular disease   .  Gout   . Moderate aortic stenosis     a. 06/2013 Echo: Mod AS, mean grad: 25, peak grad: 57;  b. 09/2014 Echo: EF 45%, Gr 2 DD, Mod AS, valve area 1.15cm^2 (VTI), 1.1cm^2 (Vmax).  . Pulmonary hypertension     a. 06/2013 Echo: PASP 74mHg.  .Marland KitchenAnemia     Prior blood transfusion  . Complication of anesthesia     daughter reports long episodes confusion after amnesia meds  . Renal cell carcinoma 2001    s/p right nephrectomy-2001; subsequent ESRD  . 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 Hypotension   . Pancytopenia   . MRSA infection     a.  08/2014->10/2014 L thigh dialysis graft.  . Infection of AV graft for dialysis     a. Recurrent L thigh infxn s/p revision 10/1, I & D 11/28, and removal 11/01/2014.   Past Surgical History  Procedure Laterality Date  . Right nephrectomy  2001    Renal cell carcinoma  . Colonoscopy  01/24/2011    prominent vascular pattern, suboptimal prep but doable  . Esophagogastroduodenoscopy  01/24/2011    mild erosive reflux esophagitis, bulbar/antral erosions, bx from antrum benign  . Av fistula repair  2008    revision of anastomosis of Right AVF  . Arteriovenous graft placement  2006    Thrombectomy and interposition jump graft revision to higher axillary vein of LUA AVG  . Coronary artery bypass graft  1998    X4 VESSELS  . Multiple cysto/ resection tumor's with bx's  LAST ONE 05-09-2011  . Multiple surg's / interventions for avgg/ fistula right upper arm  LAST REVISION 06-29-2011    [id number] [id number] ANGIOPLASTY)  . Left ptc left renal artery    . Cardiac catheterization  2004  . Cataract extraction w/ intraocular lens  implant, bilateral    . Cystoscopy w/ retrogrades  12/28/2011    Procedure: CYSTOSCOPY WITH RETROGRADE PYELOGRAM;  Surgeon: JMalka So MD;  [facility]  Service: Urology;  Laterality: Left;  C-ARM   . Transurethral resection of bladder tumor  12/28/2011    Procedure: TRANSURETHRAL RESECTION OF BLADDER TUMOR (TURBT);  Surgeon: JMalka So MD;  [facility]  Service: Urology;  Laterality: N/A;  . Av fistula placement  02/29/2012    Procedure: INSERTION OF ARTERIOVENOUS (AV) GORE-TEX GRAFT THIGH;  Surgeon: TRosetta Posner MD;  [place]  Service: Vascular;  Laterality: Right;  Insertion right femoral arteriovenous gortex graft  . Ligation of right braciocephalic av fistula  [id number] . Insertion of dialysis catheter  06/05/2012    Procedure: INSERTION OF DIALYSIS CATHETER;  Surgeon:  JMal Misty MD;  [place] [facility]  Service: Vascular;  Laterality: Left;  Insertion diatek catheter left IJ  . Insertion of dialysis catheter  08/22/2012    Procedure: INSERTION OF DIALYSIS CATHETER;  Surgeon: VSerafina Mitchell MD;  [facility];  Service: Vascular;  Laterality: Left;  insertion of dialysis catheter left  internal jugular 27cm  . Av fistula placement  10/08/2012    Procedure: INSERTION OF ARTERIOVENOUS (AV) GORE-TEX GRAFT THIGH;  Surgeon: CAngelia Mould MD;  [place] [facility]  Service: Vascular;  Laterality: Left;  . Cystoscopy with biopsy N/A 08/07/2013    Procedure: CYSTOSCOPY WITH BLADDER BIOPSY;  Surgeon: JMalka So MD;  Location: [facility];  Service: Urology;  Laterality: N/A;  . Fulguration of bladder  tumor N/A 08/07/2013    Procedure: FULGURATION OF BLADDER TUMOR;  Surgeon: Malka So, MD;  Location: [facility];  Service: Urology;  Laterality: N/A;  . Revision of arteriovenous goretex graft Left 08/27/2014    Procedure: THROMBECTOMY & REVISION OF LEFT ARM  ARTERIOVENOUS GORETEX GRAFT;  Surgeon: Angelia Mould, MD;  Location: [place];  Service: Vascular;  Laterality: Left;  . Revision of arteriovenous goretex graft Left 10/01/2014    Procedure: SEGMENT OF Left THIGH ARTERIOVENOUS GORETEX GRAFT Removed;  Surgeon: Mal Misty, MD;  Location: [place];  Service: Vascular;  Laterality: Left;  . Removal of graft Left 11/01/2014    Procedure: REMOVAL OF INFECTED LEFT THIGH GRAFT;  Surgeon: Serafina Mitchell, MD;  Location: [place] [facility];  Service: Vascular;  Laterality: Left;  removal of infected left dialysis graft.  . Left heart catheterization with coronary/graft angiogram N/A 10/27/2014    Procedure: LEFT HEART CATHETERIZATION WITH Beatrix Fetters;  Surgeon: Leonie Man, MD;  [facility];  Service: Cardiovascular;  Laterality: N/A;    No Known Allergies  Social Hx:  reports that he quit smoking about 30 years ago. His smoking use included  Cigarettes. He has a 25 pack-year smoking history. He has quit using smokeless tobacco. He reports that he drinks alcohol. He reports that he does not use illicit drugs.  Family Hx:   Family History  Problem Relation Age of Onset  . Colon cancer Neg Hx   . Liver disease Neg Hx   . Anesthesia problems Neg Hx   . Heart disease Father   . Other Mother     died @ 54 with respiratory issues following hip fx  . Diabetes Mother      Review of Systems:  Constitutional:  No weight loss, night sweats, Fevers, chills, fatigue.  HEENT:  No headaches, Difficulty swallowing,Tooth/dental problems,Sore throat,  No sneezing, itching, ear ache, nasal congestion, post nasal drip,  GI:  No heartburn, indigestion, abdominal pain, nausea, vomiting, diarrhea, change in bowel habits, loss of appetite  Resp:  No cough, No coughing up of blood. No wheezing.No chest wall deformity  Skin:  no rash or lesions.  GU:  No flank pain.  Musculoskeletal:  No joint pain or swelling. No decreased range of motion. No back pain.  Psych:  No change in mood or affect. No depression or anxiety. No memory loss.   Prior to Admission medications   Medication Sig Start Date End Date Taking? Authorizing Provider  acetaminophen (TYLENOL) 500 MG tablet Take 500 mg by mouth every 6 (six) hours as needed. For pain/headache   Yes Historical Provider, MD  albuterol (PROVENTIL HFA;VENTOLIN HFA) 108 (90 BASE) MCG/ACT inhaler Inhale 2 puffs into the lungs every 6 (six) hours as needed for wheezing or shortness of breath. 06/23/13  Yes Lendon Colonel, NP  allopurinol (ZYLOPRIM) 100 MG tablet Take 100 mg by mouth 2 (two) times daily.  09/19/11  Yes [name], MD  aspirin EC 81 MG tablet Take 81 mg by mouth at bedtime.    Yes Historical Provider, MD  B Complex-C-Folic Acid (DIALYVITE TABLET) TABS Take 1 tablet by mouth at bedtime.  05/24/12  Yes Historical Provider, MD  calcium carbonate (TUMS - DOSED IN MG ELEMENTAL  CALCIUM) 500 MG chewable tablet Chew 1 tablet by mouth daily as needed for heartburn. For heartburn   Yes Historical Provider, MD  cinacalcet (SENSIPAR) 30 MG tablet Take 30 mg by mouth daily with breakfast.  Yes Historical Provider, MD  colchicine 0.6 MG tablet Take 0.6 mg by mouth 2 (two) times daily as needed (for gout).   Yes Historical Provider, MD  docusate sodium (COLACE) 100 MG capsule Take 100 mg by mouth daily as needed for mild constipation.    Yes Historical Provider, MD  econazole nitrate 1 % cream Apply 1 application topically daily as needed (Athletes foot).   Yes Historical Provider, MD  Emollient (LUBRIDERM SERIOUSLY SENSITIVE) LOTN Apply 1 application topically daily.   Yes Historical Provider, MD  gabapentin (NEURONTIN) 100 MG capsule Take 100-200 mg by mouth 2 (two) times daily. Takes 1 in the morning and 2 at night   Yes Historical Provider, MD  midodrine (PROAMATINE) 10 MG tablet Take 10-20 mg by mouth See admin instructions. On Dialysis days (M,W,F), patient takes 2 tabs in the morning and 1 in the evening and on the other days patient takes 1 tablet in the morning and 2 in teh evening   Yes Historical Provider, MD  nitroGLYCERIN (NITROSTAT) 0.4 MG SL tablet Place 1 tablet (0.4 mg total) under the tongue every 5 (five) minutes as needed for chest pain. 11/24/14  Yes Lendon Colonel, NP  oxyCODONE-acetaminophen (PERCOCET/ROXICET) 5-325 MG per tablet Take one tablet by mouth every 6 hours as needed for pain 11/05/14  Yes Lauree Chandler, NP  polyethylene glycol powder (MIRALAX) powder Take 17 g by mouth at bedtime.    Yes Historical Provider, MD  Pramoxine-Menthol (GOLD BOND MAXIMUM RELIEF EX) Apply 1 application topically daily as needed (Itching).   Yes Historical Provider, MD  sevelamer (RENVELA) 800 MG tablet Take 1,600-2,400 mg by mouth See admin instructions. Take 3 tabs with each meals and 2 tabs with snacks   Yes Historical Provider, MD  Tbo-Filgrastim (GRANIX) 480  MCG/0.8ML SOSY injection Inject 480 mcg into the skin once a week. Tuesdays   Yes Historical Provider, MD    Physical Exam: Filed Vitals:   11/27/14 0600 11/27/14 0615 11/27/14 0730 11/27/14 0745  BP: 108/49 116/49 117/52 120/53  Pulse: 77 74 74 78  Temp:      TempSrc:      Resp: _0 Weight:      SpO2: 92% 93% 98% 96%    Wt Readings from Last 3 Encounters:  11/27/14 80.287 kg (177 lb)  11/24/14 77.565 kg (171 lb)  11/19/14 72.6 kg (160 lb 0.9 oz)    General: No acute respiratory distress at rest in bed  HEENT: Normocephalic,atraumatic, extraocular muscles intact bilaterally, OC/OP clear, sclera nonicteric Neck: No thyromegaly Lungs: Clear to auscultation bilaterally without wheezes or rhonchi w/ exception to mild bibasilar crackles  Cardiovascular: Regular rate and rhythm without gallup or rub - 2/6 holosystolic M Abdomen: Nontender, nondistended, soft, bowel sounds present, no organomegaly, no rebound, no ascites Extremities: No significant cyanosis, or clubbing;  1+ edema bilateral lower extremities Neurologic: Alert and oriented x4, cranial nerves II through XII intact bilaterally, 5 over 5 strength bilateral upper and lower extremities, no Babinski, intact to sensation of touch throughout  Labs on Admission:  Basic Metabolic Panel:  Recent Labs Lab 11/27/14 0445  NA 136  K 4.0  CL 97  CO2 28  GLUCOSE 101*  BUN 30*  CREATININE 6.08*  CALCIUM 8.4   Liver Function Tests:  Recent Labs Lab 11/27/14 0445  AST 38*  ALT 32  ALKPHOS 146*  BILITOT 0.5  PROT 6.3  ALBUMIN 3.1*   CBC:  Recent Labs Lab 11/24/14  1053 11/27/14 0445  WBC 1.4* 5.0  NEUTROABS 0.4* 3.3  HGB 9.9* 8.8*  HCT 31.3* 27.3*  MCV 102.8* 104.6*  PLT 82* 90*   Cardiac Enzymes:  Recent Labs Lab 11/27/14 0445  TROPONINI 0.07*    BNP (last 3 results)  Recent Labs  10/23/14 1117  PROBNP 30772.0*   Radiological Exams on Admission: Dg Chest 2 View  11/27/2014   CLINICAL  DATA:  Shortness of breath and chest pain  EXAM: CHEST  2 VIEW  COMPARISON:  11/18/2014  FINDINGS: Small bilateral pleural effusions. No overt pulmonary edema. Mild cardiomegaly that is stable from prior. The aorta and hila are negative. The patient is status post CABG. Surgical changes to the left axilla. Prior right subclavian stenting.  IMPRESSION: Pulmonary venous congestion with small pleural effusions.   Electronically Signed   By: Jorje Guild M.D.   On: 11/27/2014 06:16    EKG: Independently reviewed.   Time spent: 1hr +  Cherene Altes, MD [facility] For Consults/Admissions - Flow Manager - [phone] Office  [phone] Pager [phone]  On-Call/Text Page:      [email or web address]      password [facility]

## 2014-11-27 NOTE — ED Notes (Signed)
Rapid Response at the bedside for verification pt can go to room assigned.

## 2014-11-27 NOTE — ED Provider Notes (Signed)
CSN: [id number]     Arrival date & time 11/27/14  0434 History   First MD Initiated Contact with Patient 11/27/14 [phone]     Chief Complaint  Patient presents with  . Shortness of Breath  . Chest Pain     (Consider location/radiation/quality/duration/timing/severity/associated sxs/prior Treatment) HPI  Patient presents with concern of dyspnea, chest pain. Symptoms began in the hours prior to calling EMS. No relief with anything at home, though the patient's pain and dyspnea improved with nitroglycerin en route. Patient denies abdominal pain, nausea, lightheadedness, syncope, fever, cough. Patient last had dialysis 2 days ago, is scheduled for dialysis today, but that is being deferred until tomorrow due to the holiday.   Past Medical History  Diagnosis Date  . ESRD on hemodialysis     Hemodialysis since 2003; Occluded access in both upper extremities and the right lower extremity, current access as of Aug 2014 is L thigh AVG. Gets HD MWF at [facility].     . Duodenal ulcer     remote  . Chronic combined systolic and diastolic CHF (congestive heart failure)     a. 02/2012 Echo: EF 65%, basal infolat AK, mild conc LVH;  b. 06/2013 Echo: EF 40-45%, mild LVH, Gr2 DD, basal inf HK->AK, Mod AS, Mild MR, PASP 73;  c. 09/2014 Echo: EF 45% basal-mid inflat and inf AK, Gr 2DD, mod AS.  Marland Kitchen Arteriosclerotic cardiovascular disease (ASCVD) 1998    a. [id number]; b. 06/2003 Cath: 4/4 patent grafts, EF 55%;  c. 09/2014 Cath: LM 99d, LADnl, LCX nl, RCA 100p, LIMA->LAD nl, VG->OM2->OM3 nl, VG->RPL nl, EF 40-45%.  . Cerebrovascular disease   . Neuropathy of foot   . Degenerative joint disease   . Gastroesophageal reflux disease   . Impaired hearing     Bilateral; hearing aids relatively ineffective  . Hypertension   . Cholelithiasis 03/2011    Diagnosed incidentally on abdominal ultrasound in 03/2011  . Hepatic steatosis   . Macrocytosis 10/20/2012  . Thrombocytopenia 10/20/2012  .  Hyperlipidemia   . Secondary hyperparathyroidism   . Peripheral vascular disease   . Gout   . Moderate aortic stenosis     a. 06/2013 Echo: Mod AS, mean grad: 25, peak grad: 57;  b. 09/2014 Echo: EF 45%, Gr 2 DD, Mod AS, valve area 1.15cm^2 (VTI), 1.1cm^2 (Vmax).  . Pulmonary hypertension     a. 06/2013 Echo: PASP 22mmHg.  Marland Kitchen Anemia     Prior blood transfusion  . Complication of anesthesia     daughter reports long episodes confusion after amnesia meds  . Renal cell carcinoma 2001    s/p right nephrectomy-2001; subsequent ESRD  . 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 Hypotension   . Pancytopenia   . MRSA infection     a. 08/2014->10/2014 L thigh dialysis graft.  . Infection of AV graft for dialysis     a. Recurrent L thigh infxn s/p revision 10/1, I & D 11/28, and removal 11/01/2014.   Past Surgical History  Procedure Laterality Date  . Right nephrectomy  2001    Renal cell carcinoma  . Colonoscopy  01/24/2011    prominent vascular pattern, suboptimal prep but doable  . Esophagogastroduodenoscopy  01/24/2011    mild erosive reflux esophagitis, bulbar/antral erosions, bx from antrum benign  . Av fistula repair  2008    revision of anastomosis of Right AVF  . Arteriovenous graft placement  2006    Thrombectomy and interposition jump graft revision to  higher axillary vein of LUA AVG  . Coronary artery bypass graft  1998    X4 VESSELS  . Multiple cysto/ resection tumor's with bx's  LAST ONE 05-09-2011  . Multiple surg's / interventions for avgg/ fistula right upper arm  LAST REVISION 06-29-2011    (JUNE 2010 STENT CEPHALIC VEIN/ 03-23-2011 DILATATION ANGIOPLASTY)  . Left ptc left renal artery    . Cardiac catheterization  2004  . Cataract extraction w/ intraocular lens  implant, bilateral    . Cystoscopy w/ retrogrades  12/28/2011    Procedure: CYSTOSCOPY WITH RETROGRADE PYELOGRAM;  Surgeon: [name], MD;  Location: [facility];  Service:  Urology;  Laterality: Left;  C-ARM   . Transurethral resection of bladder tumor  12/28/2011    Procedure: TRANSURETHRAL RESECTION OF BLADDER TUMOR (TURBT);  Surgeon: [name], MD;  Location: [facility];  Service: Urology;  Laterality: N/A;  . Av fistula placement  02/29/2012    Procedure: INSERTION OF ARTERIOVENOUS (AV) GORE-TEX GRAFT THIGH;  Surgeon: [name], MD;  Location[facility] OR;  Service: Vascular;  Laterality: Right;  Insertion right femoral arteriovenous gortex graft  . Ligation of right braciocephalic av fistula  04-04-2012  . Insertion of dialysis catheter  06/05/2012    Procedure: INSERTION OF DIALYSIS CATHETER;  Surgeon: Pryor Ochoa, MD;  Location: [facility] OR;  Service: Vascular;  Laterality: Left;  Insertion diatek catheter left IJ  . Insertion of dialysis catheter  08/22/2012    Procedure: INSERTION OF DIALYSIS CATHETER;  Surgeon: Nada Libman, MD;  Location: [facility];  Service: Vascular;  Laterality: Left;  insertion of dialysis catheter left  internal jugular 27cm  . Av fistula placement  10/08/2012    Procedure: INSERTION OF ARTERIOVENOUS (AV) GORE-TEX GRAFT THIGH;  Surgeon: Chuck Hint, MD;  Location: [facility] OR;  Service: Vascular;  Laterality: Left;  . Cystoscopy with biopsy N/A 08/07/2013    Procedure: CYSTOSCOPY WITH BLADDER BIOPSY;  Surgeon: [name], MD;  Location: [facility] ORS;  Service: Urology;  Laterality: N/A;  . Fulguration of bladder tumor N/A 08/07/2013    Procedure: FULGURATION OF BLADDER TUMOR;  Surgeon: [name], MD;  Location: [facility]S;  Service: Urology;  Laterality: N/A;  . Revision of arteriovenous goretex graft Left 08/27/2014    Procedure: THROMBECTOMY & REVISION OF LEFT ARM  ARTERIOVENOUS GORETEX GRAFT;  Surgeon: Chuck Hint, MD;  Location: [facility] OR;  Service: Vascular;  Laterality: Left;  . Revision of arteriovenous goretex graft Left 10/01/2014    Procedure: SEGMENT OF Left THIGH ARTERIOVENOUS GORETEX GRAFT Removed;   Surgeon: Pryor Ochoa, MD;  Location: [facility] OR;  Service: Vascular;  Laterality: Left;  . Removal of graft Left 11/01/2014    Procedure: REMOVAL OF INFECTED LEFT THIGH GRAFT;  Surgeon: Nada Libman, MD;  Location: [facility] OR;  Service: Vascular;  Laterality: Left;  removal of infected left dialysis graft.  . Left heart catheterization with coronary/graft angiogram N/A 10/27/2014    Procedure: LEFT HEART CATHETERIZATION WITH Isabel Caprice;  Surgeon: Marykay Lex, MD;  Location: [facility] CATH LAB;  Service: Cardiovascular;  Laterality: N/A;   Family History  Problem Relation Age of Onset  . Colon cancer Neg Hx   . Liver disease Neg Hx   . Anesthesia problems Neg Hx   . Heart disease Father   . Other Mother     died @ 46 with respiratory issues following hip fx  . Diabetes Mother  History  Substance Use Topics  . Smoking status: Former Smoker -- 1.00 packs/day for 25 years    Types: Cigarettes    Quit date: 02/26/1984  . Smokeless tobacco: Former Systems developer     Comment: quit 1985  . Alcohol Use: 0.0 oz/week    0 Not specified per week     Comment: Rarely    Review of Systems  Constitutional:       Per HPI, otherwise negative  HENT:       Per HPI, otherwise negative  Respiratory:       Per HPI, otherwise negative  Cardiovascular:       Per HPI, otherwise negative  Gastrointestinal: Negative for vomiting.  Endocrine:       Negative aside from HPI  Genitourinary:       Neg aside from HPI   Musculoskeletal:       Per HPI, otherwise negative  Skin: Negative.   Neurological: Positive for weakness. Negative for syncope.      Allergies  Review of patient's allergies indicates no known allergies.  Home Medications   Prior to Admission medications   Medication Sig Start Date End Date Taking? Authorizing Provider  acetaminophen (TYLENOL) 500 MG tablet Take 500 mg by mouth every 6 (six) hours as needed. For pain/headache    Historical Provider, MD  albuterol (PROVENTIL  HFA;VENTOLIN HFA) 108 (90 BASE) MCG/ACT inhaler Inhale 2 puffs into the lungs every 6 (six) hours as needed for wheezing or shortness of breath. 06/23/13   Lendon Colonel, NP  allopurinol (ZYLOPRIM) 100 MG tablet Take 100 mg by mouth 2 (two) times daily.  09/19/11   [name], MD  aspirin EC 81 MG tablet Take 81 mg by mouth at bedtime.     Historical Provider, MD  B Complex-C-Folic Acid (DIALYVITE TABLET) TABS Take 1 tablet by mouth at bedtime.  05/24/12   Historical Provider, MD  calcium carbonate (TUMS - DOSED IN MG ELEMENTAL CALCIUM) 500 MG chewable tablet Chew 1 tablet by mouth daily as needed for heartburn. For heartburn    Historical Provider, MD  cinacalcet (SENSIPAR) 30 MG tablet Take 30 mg by mouth daily with breakfast.     Historical Provider, MD  colchicine 0.6 MG tablet Take 0.6 mg by mouth 2 (two) times daily as needed (for gout).    Historical Provider, MD  docusate sodium (COLACE) 100 MG capsule Take 100 mg by mouth daily as needed for mild constipation.     Historical Provider, MD  econazole nitrate 1 % cream Apply 1 application topically daily as needed (Athletes foot).    Historical Provider, MD  Emollient (LUBRIDERM SERIOUSLY SENSITIVE) LOTN Apply 1 application topically daily.    Historical Provider, MD  gabapentin (NEURONTIN) 100 MG capsule Take 100-200 mg by mouth 2 (two) times daily. Takes 1 in the morning and 2 at night    Historical Provider, MD  midodrine (PROAMATINE) 10 MG tablet Take 10-20 mg by mouth See admin instructions. On Dialysis days (M,W,F), patient takes 2 tabs in the morning and 1 in the evening and on the other days patient takes 1 tablet in the morning and 2 in teh evening    Historical Provider, MD  nitroGLYCERIN (NITROSTAT) 0.4 MG SL tablet Place 1 tablet (0.4 mg total) under the tongue every 5 (five) minutes as needed for chest pain. 11/24/14   Lendon Colonel, NP  oxyCODONE-acetaminophen (PERCOCET/ROXICET) 5-325 MG per tablet Take one tablet by  mouth every 6 hours as  needed for pain 11/05/14   Lauree Chandler, NP  polyethylene glycol powder (MIRALAX) powder Take 17 g by mouth at bedtime.     Historical Provider, MD  Pramoxine-Menthol (GOLD BOND MAXIMUM RELIEF EX) Apply 1 application topically daily as needed (Itching).    Historical Provider, MD  sevelamer (RENVELA) 800 MG tablet Take 1,600-2,400 mg by mouth See admin instructions. Take 3 tabs with each meals and 2 tabs with snacks    Historical Provider, MD  Tbo-Filgrastim (GRANIX) 480 MCG/0.8ML SOSY injection Inject 480 mcg into the skin once a week. Tuesdays    Historical Provider, MD   BP 123/67 mmHg  Pulse 101  Temp(Src) 97.7 F (36.5 C) (Oral)  Resp 22  Wt 177 lb (80.287 kg)  SpO2 100% Physical Exam  Constitutional: He is oriented to person, place, and time. He appears well-developed and well-nourished.  HENT:  Head: Normocephalic and atraumatic.  Eyes: Pupils are equal, round, and reactive to light.  Neck: Normal range of motion. Neck supple.  Cardiovascular: Normal rate, regular rhythm and normal heart sounds.   Pulmonary/Chest: Effort normal. Tachypnea noted. He has decreased breath sounds. He has no wheezes.  Abdominal: Soft. Bowel sounds are normal. There is no tenderness. There is no rebound and no guarding.  Musculoskeletal: Normal range of motion. He exhibits no edema.  Lymphadenopathy:    He has no cervical adenopathy.  Neurological: He is alert and oriented to person, place, and time.  Skin: Skin is warm and dry. No rash noted.  Psychiatric: He has a normal mood and affect.    ED Course  Procedures (including critical care time) Labs Review Labs Reviewed  CBC WITH DIFFERENTIAL - Abnormal; Notable for the following:    RBC 2.61 (*)    Hemoglobin 8.8 (*)    HCT 27.3 (*)    MCV 104.6 (*)    RDW 22.3 (*)    Platelets 90 (*)    All other components within normal limits  COMPREHENSIVE METABOLIC PANEL - Abnormal; Notable for the following:    Glucose,  Bld 101 (*)    BUN 30 (*)    Creatinine, Ser 6.08 (*)    Albumin 3.1 (*)    AST 38 (*)    Alkaline Phosphatase 146 (*)    GFR calc non Af Amer 7 (*)    GFR calc Af Amer 9 (*)    All other components within normal limits  URINALYSIS, ROUTINE W REFLEX MICROSCOPIC - Abnormal; Notable for the following:    Hgb urine dipstick TRACE (*)    Protein, ur 30 (*)    Leukocytes, UA TRACE (*)    All other components within normal limits  TROPONIN I - Abnormal; Notable for the following:    Troponin I 0.07 (*)    All other components within normal limits  BRAIN NATRIURETIC PEPTIDE - Abnormal; Notable for the following:    B Natriuretic Peptide 2150.3 (*)    All other components within normal limits  URINE MICROSCOPIC-ADD ON    Imaging Review Dg Chest 2 View  11/27/2014   CLINICAL DATA:  Shortness of breath and chest pain  EXAM: CHEST  2 VIEW  COMPARISON:  11/18/2014  FINDINGS: Small bilateral pleural effusions. No overt pulmonary edema. Mild cardiomegaly that is stable from prior. The aorta and hila are negative. The patient is status post CABG. Surgical changes to the left axilla. Prior right subclavian stenting.  IMPRESSION: Pulmonary venous congestion with small pleural effusions.   Electronically Signed  By: Jorje Guild M.D.   On: 11/27/2014 06:16     EKG Interpretation None     Rhythm strip provided via EMS providers shows sinus tachycardia, artifact, T-wave abnormalities, rate 104, abnormal   EKG shows sinus tachycardia, 101, PA-C, artifact, ST-T wave changes, abnormal   O2- 99% [place], abnormal  Update: Patient seems better, and according to family, he looks better.  He also states that he feels better.  MDM  Patient with multiple medical issues, including end-stage renal disease, pulmonary hypertension, heart failure now presents with dyspnea, chest pain.  Patient's symptoms resolved with nitroglycerin en route, breathing treatment here. Patient has multiple medical abnormality  is, including elevated troponin, though decreased from prior, elevated BNP and bilateral pleural effusions. Patient required admission for further evaluation and management.   Carmin Muskrat, MD 11/27/14 [phone]

## 2014-11-27 NOTE — ED Notes (Signed)
Patient with shortness of breath and chest pain.  Patient is here visiting daughter and woke up with acute shortness of breath.  Patient is a dialysis patient.  Patient has been to dialysis on his scheduled days.  Patient is CAOx3.  Dialysis is MWF.  Patient is due today for dialysis, not scheduled until tomorrow.

## 2014-11-28 ENCOUNTER — Other Ambulatory Visit: Payer: Self-pay

## 2014-11-28 LAB — RENAL FUNCTION PANEL
ANION GAP: 12 (ref 5–15)
Albumin: 3.3 g/dL — ABNORMAL LOW (ref 3.5–5.2)
BUN: 52 mg/dL — ABNORMAL HIGH (ref 6–23)
CO2: 28 mmol/L (ref 19–32)
CREATININE: 7.68 mg/dL — AB (ref 0.50–1.35)
Calcium: 8.3 mg/dL — ABNORMAL LOW (ref 8.4–10.5)
Chloride: 99 mEq/L (ref 96–112)
GFR calc Af Amer: 7 mL/min — ABNORMAL LOW (ref >90)
GFR calc non Af Amer: 6 mL/min — ABNORMAL LOW (ref >90)
GLUCOSE: 83 mg/dL (ref 70–99)
POTASSIUM: 5.4 mmol/L — AB (ref 3.5–5.1)
Phosphorus: 4 mg/dL (ref 2.3–4.6)
Sodium: 139 mmol/L (ref 135–145)

## 2014-11-28 LAB — TROPONIN I: Troponin I: 0.2 ng/mL — ABNORMAL HIGH (ref <0.031)

## 2014-11-28 MED ORDER — ALTEPLASE 2 MG IJ SOLR
2.0000 mg | Freq: Once | INTRAMUSCULAR | Status: DC | PRN
  Filled 2014-11-28: qty 2

## 2014-11-28 MED ORDER — HEPARIN SODIUM (PORCINE) 1000 UNIT/ML DIALYSIS: 1000.0000 [IU] | INTRAMUSCULAR | Status: DC | PRN

## 2014-11-28 MED ORDER — LIDOCAINE HCL (PF) 1 % IJ SOLN: 5.0000 mL | INTRAMUSCULAR | Status: DC | PRN

## 2014-11-28 MED ORDER — PENTAFLUOROPROP-TETRAFLUOROETH EX AERO: 1.0000 "application " | INHALATION_SPRAY | CUTANEOUS | Status: DC | PRN

## 2014-11-28 MED ORDER — HEPARIN SODIUM (PORCINE) 1000 UNIT/ML DIALYSIS: 2000.0000 [IU] | Freq: Once | INTRAMUSCULAR | Status: DC

## 2014-11-28 MED ORDER — SODIUM CHLORIDE 0.9 % IV SOLN: 100.0000 mL | INTRAVENOUS | Status: DC | PRN

## 2014-11-28 MED ORDER — DARBEPOETIN ALFA 150 MCG/0.3ML IJ SOSY: 150.0000 ug | PREFILLED_SYRINGE | INTRAMUSCULAR | Status: DC

## 2014-11-28 MED ORDER — LIDOCAINE-PRILOCAINE 2.5-2.5 % EX CREA
1.0000 "application " | TOPICAL_CREAM | CUTANEOUS | Status: DC | PRN
  Filled 2014-11-28: qty 5

## 2014-11-28 MED ORDER — MIDODRINE HCL 5 MG PO TABS
10.0000 mg | ORAL_TABLET | Freq: Three times a day (TID) | ORAL | Status: DC
  Administered 2014-11-28 – 2014-11-30 (×6): 10 mg via ORAL
  Filled 2014-11-28 (×8): qty 2

## 2014-11-28 MED ORDER — DOXERCALCIFEROL 4 MCG/2ML IV SOLN
3.0000 ug | INTRAVENOUS | Status: DC
  Administered 2014-11-30: 3 ug via INTRAVENOUS
  Filled 2014-11-28: qty 2

## 2014-11-28 MED ORDER — NEPRO/CARBSTEADY PO LIQD
237.0000 mL | ORAL | Status: DC | PRN
  Filled 2014-11-28: qty 237

## 2014-11-28 NOTE — Procedures (Signed)
I was present at this dialysis session, have reviewed the session itself and made  appropriate changes  Kelly Splinter MD (pgr) [phone]    [phone] 11/28/2014, 6:33 PM

## 2014-11-28 NOTE — Progress Notes (Signed)
Patient Demographics  Andrew [name], is a 81 y.o. male, DOB - [birth year], MHD:[id number]  Admit date - 11/27/2014   Admitting Physician Cherene Altes, MD  Outpatient Primary MD for the patient is [name]., MD  LOS - 1   Chief Complaint  Patient presents with  . Shortness of Breath  . Chest Pain        Subjective:   Andrew [name] today has, No headache, No chest pain, No abdominal pain - No Nausea, No new weakness tingling or numbness, No Cough - SOB.    Assessment & Plan    1. Acute on chronic combined systolic and diastolic CHF EF 21% secondary to delay in dialysis. Also had nonspecific chest pain with non-ACS [place] troponin bump.  Renal has been consulted for dialysis  on 11/28/2013, patient is currently symptom free with minimal shortness of breath and no chest pain, EKG is nonacute, he had a left heart cath in November 2015 which showed patent bypass vessels. He is on aspirin which will be continued, history of low blood pressure and not able to tolerate beta blocker, continue supportive care. Outpatient follow-up with cardiology postdischarge.   Of note last admission one week ago his troponin bumped to 0.15 and was seen by cardiology and no further treatment or investigation was advised by Dr. Gwenlyn Found.    2. ESRD. Monday Wednesday Friday schedule, will be dialyzed today had not been dialyzed on Friday.   3. Anemia of chronic disease. Stable goal to keep hemoglobin above 8 in the setting of #1 above.   4. CAD. Plan as in #1 above, blood pressure too low for beta blocker, chest pain-free, EKG nonacute, continue aspirin, non-ACS pattern troponin bump secondary to volume overload and CHF.   5. Chronically low blood pressures. Continue midodrine.   6. Peripheral neuropathy.  On Neurontin.   7. Moderate aortic stenosis. Outpatient follow-up with cardiology.   8.Pulm HTN - supportive care.       Code Status: Full  Family Communication: none  Disposition Plan: Home   Procedures   TTE Nov 2015  - Left ventricle: The cavity size was at the upper limits ofnormal. Wall thickness was normal. The estimated ejectionfraction was 45%. There is akinesis of the basal-midinferolateraland inferior myocardium. Features are consistent with a pseudonormal left ventricular filling pattern, with concomitantabnormal relaxation and increased filling pressure (grade 2diastolic dysfunction). - Aortic valve: Cusp separation was moderately reduced. There wasmoderate stenosis. Valve area (VTI): 1.15 cm^2. Valve area (Vmax): 1.1 cm^2. Valve area (Vmean): 1.14 cm^2. - Left atrium: The atrium was mildly dilated. - Right atrium: The atrium was mildly dilated.   L Heart Cath Nov 2015  Cath: LM 99d, LADnl, LCX nl, RCA 100p, LIMA->LAD nl, VG->OM2->OM3 nl, VG->RPL nl, EF 40-45%.   Consults  Renal   Medications  Scheduled Meds: . allopurinol  100 mg Oral BID  . aspirin EC  81 mg Oral QHS  . cinacalcet  30 mg Oral Q breakfast  . gabapentin  100 mg Oral Daily  . gabapentin  200 mg Oral QHS  . heparin  5,000 Units Subcutaneous 3 times per day  . [START ON 11/29/2014] midodrine  10 mg Oral Q T,Th,S,Su  . [START ON 11/30/2014] midodrine  10 mg Oral Q M,W,F-1800  . midodrine  10 mg Oral Once  . midodrine  20 mg Oral Q M,W,F  . [START ON 11/29/2014] midodrine  20 mg Oral Q T,Th,S,Su-1800  . multivitamin  1 tablet Oral QHS  . polyethylene glycol  17 g Oral Daily  . sevelamer carbonate  1,600 mg Oral TID WC   Continuous Infusions:  PRN Meds:.sodium chloride, acetaminophen, calcium carbonate, colchicine, docusate sodium, nitroGLYCERIN, oxyCODONE-acetaminophen, sevelamer carbonate  DVT Prophylaxis    Heparin   Lab Results  Component Value Date   PLT 101* 11/27/2014     Antibiotics     Anti-infectives    None          Objective:   Filed Vitals:   11/27/14 1430 11/27/14 1715 11/27/14 2056 11/28/14 0458  BP: 119/50 148/60 139/56 152/79  Pulse: 77 88 84 86  Temp: 98 F (36.7 C)  98.4 F (36.9 C) 97.5 F (36.4 C)  TempSrc: Oral  Oral Oral  Resp: 18  18 18   Height:      Weight:    77.202 kg (170 lb 3.2 oz)  SpO2: 100%  99% 100%    Wt Readings from Last 3 Encounters:  11/28/14 77.202 kg (170 lb 3.2 oz)  11/24/14 77.565 kg (171 lb)  11/19/14 72.6 kg (160 lb 0.9 oz)     Intake/Output Summary (Last 24 hours) at 11/28/14 0937 Last data filed at 11/28/14 0904  Gross per 24 hour  Intake    900 ml  Output      0 ml  Net    900 ml     Physical Exam  Awake Alert, Oriented X 3, No new F.N deficits, Normal affect Andrew [name],PERRAL Supple Neck,No JVD, No cervical lymphadenopathy appriciated.  Symmetrical Chest wall movement, Good air movement bilaterally, +ve Rales RRR,No Gallops,Rubs or new Murmurs, No Parasternal Heave +ve B.Sounds, Abd Soft, No tenderness, No organomegaly appriciated, No rebound - guarding or rigidity. No Cyanosis, Clubbing or edema, No new Rash or bruise      Data Review   Micro Results Recent Results (from the past 240 hour(s))  MRSA PCR Screening     Status: None   Collection Time: 11/18/14  5:12 PM  Result Value Ref Range Status   MRSA by PCR NEGATIVE NEGATIVE Final    Comment:        The GeneXpert MRSA Assay (FDA approved for NASAL specimens only), is one component of a comprehensive MRSA colonization surveillance program. It is not intended to diagnose MRSA infection nor to guide or monitor treatment for MRSA infections.   MRSA PCR Screening     Status: None   Collection Time: 11/27/14 11:12 AM  Result Value Ref Range Status   MRSA by PCR NEGATIVE NEGATIVE Final    Comment:        The GeneXpert MRSA Assay (FDA approved for NASAL specimens only), is one component of a comprehensive MRSA  colonization surveillance program. It is not intended to diagnose MRSA infection nor to guide or monitor treatment for MRSA infections.     Radiology Reports Dg Chest 2 View  11/27/2014   CLINICAL DATA:  Shortness of breath and chest pain  EXAM: CHEST  2 VIEW  COMPARISON:  11/18/2014  FINDINGS: Small bilateral pleural effusions. No overt pulmonary edema. Mild cardiomegaly that is stable from prior. The aorta and hila are negative. The patient is status post CABG. Surgical changes to the left axilla. Prior right subclavian stenting.  IMPRESSION: Pulmonary  venous congestion with small pleural effusions.   Electronically Signed   By: Jorje Guild M.D.   On: 11/27/2014 06:16       CBC  Recent Labs Lab 11/24/14 1053 11/27/14 0445 11/27/14 2248  WBC 1.4* 5.0 3.4*  HGB 9.9* 8.8* 8.5*  HCT 31.3* 27.3* 25.9*  PLT 82* 90* 101*  MCV 102.8* 104.6* 104.4*  MCH 32.5 33.7 34.3*  MCHC 31.6* 32.2 32.8  RDW 24.1* 22.3* 22.5*  LYMPHSABS 0.7* 1.3  --   MONOABS 0.2 0.3  --   EOSABS 0.1 0.1  --   BASOSABS 0.0 0.0  --     Chemistries   Recent Labs Lab 11/27/14 0445 11/28/14 0238  NA 136 139  K 4.0 5.4*  CL 97 99  CO2 28 28  GLUCOSE 101* 83  BUN 30* 52*  CREATININE 6.08* 7.68*  CALCIUM 8.4 8.3*  AST 38*  --   ALT 32  --   ALKPHOS 146*  --   BILITOT 0.5  --    ------------------------------------------------------------------------------------------------------------------ estimated creatinine clearance is 6.7 mL/min (by C-G formula based on Cr of 7.68). ------------------------------------------------------------------------------------------------------------------ No results for input(s): HGBA1C in the last 72 hours. ------------------------------------------------------------------------------------------------------------------ No results for input(s): CHOL, HDL, LDLCALC, TRIG, CHOLHDL, LDLDIRECT in the last 72  hours. ------------------------------------------------------------------------------------------------------------------ No results for input(s): TSH, T4TOTAL, T3FREE, THYROIDAB in the last 72 hours.  Invalid input(s): FREET3 ------------------------------------------------------------------------------------------------------------------ No results for input(s): VITAMINB12, FOLATE, FERRITIN, TIBC, IRON, RETICCTPCT in the last 72 hours.  Coagulation profile No results for input(s): INR, PROTIME in the last 168 hours.  No results for input(s): DDIMER in the last 72 hours.  Cardiac Enzymes  Recent Labs Lab 11/27/14 1822 11/27/14 2248 11/28/14 0238  TROPONINI 0.23* 0.22* 0.20*   ------------------------------------------------------------------------------------------------------------------ Invalid input(s): POCBNP     Time Spent in minutes  35   Lashawne Dura K M.D on 11/28/2014 at 9:37 AM  Between 7am to 7pm - Pager - [phone]  After 7pm go to [email or web address] - [facility] Office  [phone]

## 2014-11-28 NOTE — Consult Note (Signed)
Renal Service Consult Note [place] 11/28/2014 [place] Requesting Physician:  Dr Candiss Norse  Reason for Consult:  ESRD pt with SOB HPI: The patient is a 81 y.o. year-old with hx of ESRD, MDS, CVA, prior CABG presented to ED 11/27/14 with SOB and chest pain. CXR showed vasc congestion. EKG was negative. Rx with SL NTG , nebs and O2 will marked relief according to family. Admitted and today denies SOB. Dry cough, no further CP. Trop 0.9 > 2.0 and flat now.  Wt is 77kg , 0.5kg over dry wt.  No fevers. No n/v/d, no abd pain   Chart review: 5/04 - CHF d/t vol excess and renal failure, high K, ESRD on HD, HTN 6/04 - pulm edema/ vol overload 7/04 - pulm edema/ vol overload, rx with HD  8/04 - CP/ SOB, underwent LHC which showed patent grafts from prior CABG 1998; hx prior nephx, TCCA of bladder, hx CVA 12/06 - pulm edema, vol ocerload 3/08 - urosepsis, poss cath sepsis, responded to IV abx. ESRD on HD  2/11 - COPD / tracheobronchitis, acute; acute gout, ESRD on HD 10/12 - SOB, orthopnea, resolved with HD 4/13 - insertion of R thigh AVGG 4/13 - Delirium, gait change, anemia, hx duodenal ulcer, HTN, ESRD 4/13 - chest pain due to pulm edema, rx with HD, also high K 5/13 - acute pulm edema, HTN, high K, ESRD; improved with HD 6/13 - dyspnea with IS edema on CXR, clotted thigh AVG > temp cath and HD resolved SOB, T-lysis of AVG by IR 9/13 - cath-related micrococcus bacteremia, ESRD on HD, pancytopenia, anemia required PRBC's x 1  11/13 - SOB d/t CHF./vol excess, rx with HD 11/13 - severe anemia, Hb 7.3 > rec'd 2u prbc's. ESRD on HD. Last colonoscopy was 2012, EGD 2012 showed eroisve esophagitis and antral erosions. Chronic low PLT's, hx CABG, bladder Ca 7/14 - CP/SOB dx was CAP rx with 6 days abx. SIRS on admission resolved with ABX. HCAP was final dx.  Vanc/cefipime 9/14 - Anemia Hb 7, hx MDS, rec'd prbc's. Had some CP which resolved. Revlimid stopped (for MDS)  d/t anemia.  08/27/14 - OP - had thrombectomy and addition of new venous limb because of eschar overlying the venous limb of the AVG 10/01/14 - OP - had removal of short segment of infected nonfunctional left thigh graft (venous limb) 11/27 - 11/03/14 > presented with CP w exertion, also purulent L thigh AVG infection > underwent [place] cath on 12/1 showed some disease in smaller vessels, no intervention required.  AFter cardiac w/u fevers worsened and drainage from AVG prompted trip to OR with VVS for removal of the rest of the lateral limb nonfunctional AVG component.  AFter surgery fevers resolved, ABx to be cont'd for 7 days from date of surgery 12/23 - 11/19/14 > chest pain, ruled out for MI. Recent cath showed 4/4 grafts patent.  Also anemia, hx MDS w 5Q deletion,. ESRD on HD, low plt's chronic.    -- Dec 6th had infected venous limb again and underwent removal of remainder of nonfunctioning infected venous limb; the existing functional graft was visualized and showed no signs of infection  ROS  no sore throat, HA  no confusion  Past Medical History  Past Medical History  Diagnosis Date  . ESRD on hemodialysis     Hemodialysis since 2003; Occluded access in both upper extremities and the right lower extremity, current access as of Aug 2014 is L thigh  AVG. Gets HD MWF at [facility].     . Duodenal ulcer     remote  . Chronic combined systolic and diastolic CHF (congestive heart failure)     a. 02/2012 Echo: EF 65%, basal infolat AK, mild conc LVH;  b. 06/2013 Echo: EF 40-45%, mild LVH, Gr2 DD, basal inf HK->AK, Mod AS, Mild MR, PASP 73;  c. 09/2014 Echo: EF 45% basal-mid inflat and inf AK, Gr 2DD, mod AS.  Marland Kitchen Arteriosclerotic cardiovascular disease (ASCVD) 1998    a. [id number]; b. 06/2003 Cath: 4/4 patent grafts, EF 55%;  c. 09/2014 Cath: LM 99d, LADnl, LCX nl, RCA 100p, LIMA->LAD nl, VG->OM2->OM3 nl, VG->RPL nl, EF 40-45%.  . Cerebrovascular disease   . Neuropathy of foot   .  Degenerative joint disease   . Gastroesophageal reflux disease   . Impaired hearing     Bilateral; hearing aids relatively ineffective  . Hypertension   . Cholelithiasis 03/2011    Diagnosed incidentally on abdominal ultrasound in 03/2011  . Hepatic steatosis   . Macrocytosis 10/20/2012  . Thrombocytopenia 10/20/2012  . Hyperlipidemia   . Secondary hyperparathyroidism   . Peripheral vascular disease   . Gout   . Moderate aortic stenosis     a. 06/2013 Echo: Mod AS, mean grad: 25, peak grad: 57;  b. 09/2014 Echo: EF 45%, Gr 2 DD, Mod AS, valve area 1.15cm^2 (VTI), 1.1cm^2 (Vmax).  . Pulmonary hypertension     a. 06/2013 Echo: PASP 32mmHg.  Marland Kitchen Anemia     Prior blood transfusion  . Complication of anesthesia     daughter reports long episodes confusion after amnesia meds  . Renal cell carcinoma 2001    s/p right nephrectomy-2001; subsequent ESRD  . 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 Hypotension   . Pancytopenia   . MRSA infection     a. 08/2014->10/2014 L thigh dialysis graft.  . Infection of AV graft for dialysis     a. Recurrent L thigh infxn s/p revision 10/1, I & D 11/28, and removal 11/01/2014.   Past Surgical History  Past Surgical History  Procedure Laterality Date  . Right nephrectomy  2001    Renal cell carcinoma  . Colonoscopy  01/24/2011    prominent vascular pattern, suboptimal prep but doable  . Esophagogastroduodenoscopy  01/24/2011    mild erosive reflux esophagitis, bulbar/antral erosions, bx from antrum benign  . Av fistula repair  2008    revision of anastomosis of Right AVF  . Arteriovenous graft placement  2006    Thrombectomy and interposition jump graft revision to higher axillary vein of LUA AVG  . Coronary artery bypass graft  1998    X4 VESSELS  . Multiple cysto/ resection tumor's with bx's  LAST ONE 05-09-2011  . Multiple surg's / interventions for avgg/ fistula right upper arm  LAST REVISION 06-29-2011    (J[id number] [place] VEIN/ [id number] DILATATION ANGIOPLASTY)  . Left ptc left renal artery    . Cardiac catheterization  2004  . Cataract extraction w/ intraocular lens  implant, bilateral    . Cystoscopy w/ retrogrades  12/28/2011    Procedure: CYSTOSCOPY WITH RETROGRADE PYELOGRAM;  Surgeon: Malka So, MD;  Location: [facility];  Service: Urology;  Laterality: Left;  C-ARM   . Transurethral resection of bladder tumor  12/28/2011    Procedure: TRANSURETHRAL RESECTION OF BLADDER TUMOR (TURBT);  Surgeon: Malka So, MD;  Location: [facility];  Service:  Urology;  Laterality: N/A;  . Av fistula placement  02/29/2012    Procedure: INSERTION OF ARTERIOVENOUS (AV) GORE-TEX GRAFT THIGH;  Surgeon: Rosetta Posner, MD;  Location: [place];  Service: Vascular;  Laterality: Right;  Insertion right femoral arteriovenous gortex graft  . Ligation of right braciocephalic av fistula Right 04-04-2012  . Insertion of dialysis catheter  06/05/2012    Procedure: INSERTION OF DIALYSIS CATHETER;  Surgeon: Mal Misty, MD;  Location: [place];  Service: Vascular;  Laterality: Left;  Insertion diatek catheter left IJ  . Insertion of dialysis catheter  08/22/2012    Procedure: INSERTION OF DIALYSIS CATHETER;  Surgeon: Serafina Mitchell, MD;  Location: [facility];  Service: Vascular;  Laterality: Left;  insertion of dialysis catheter left  internal jugular 27cm  . Av fistula placement  10/08/2012    Procedure: INSERTION OF ARTERIOVENOUS (AV) GORE-TEX GRAFT THIGH;  Surgeon: Angelia Mould, MD;  Location: [place];  Service: Vascular;  Laterality: Left;  . Cystoscopy with biopsy N/A 08/07/2013    Procedure: CYSTOSCOPY WITH BLADDER BIOPSY;  Surgeon: Malka So, MD;  Location: [facility];  Service: Urology;  Laterality: N/A;  . Fulguration of bladder tumor N/A 08/07/2013    Procedure: FULGURATION OF BLADDER TUMOR;  Surgeon: Malka So, MD;  Location: [facility];  Service: Urology;  Laterality: N/A;  . Revision of  arteriovenous goretex graft Left 08/27/2014    Procedure: THROMBECTOMY & REVISION OF LEFT ARM  ARTERIOVENOUS GORETEX GRAFT;  Surgeon: Angelia Mould, MD;  Location: [place];  Service: Vascular;  Laterality: Left;  . Revision of arteriovenous goretex graft Left 10/01/2014    Procedure: SEGMENT OF Left THIGH ARTERIOVENOUS GORETEX GRAFT Removed;  Surgeon: Mal Misty, MD;  Location: [place];  Service: Vascular;  Laterality: Left;  . Removal of graft Left 11/01/2014    Procedure: REMOVAL OF INFECTED LEFT THIGH GRAFT;  Surgeon: Serafina Mitchell, MD;  Location: [place] [facility];  Service: Vascular;  Laterality: Left;  removal of infected left dialysis graft.  . Left heart catheterization with coronary/graft angiogram N/A 10/27/2014    Procedure: LEFT HEART CATHETERIZATION WITH Beatrix Fetters;  Surgeon: Leonie Man, MD;  Location: [facility] CATH LAB;  Service: Cardiovascular;  Laterality: N/A;  . Tonsillectomy     Family History  Family History  Problem Relation Age of Onset  . Colon cancer Neg Hx   . Liver disease Neg Hx   . Anesthesia problems Neg Hx   . Heart disease Father   . Other Mother     died @ 74 with respiratory issues following hip fx  . Diabetes Mother    Social History  reports that he quit smoking about 30 years ago. His smoking use included Cigarettes. He has a 25 pack-year smoking history. He has quit using smokeless tobacco. He reports that he drinks alcohol. He reports that he does not use illicit drugs. Allergies No Known Allergies Home medications Prior to Admission medications   Medication Sig Start Date End Date Taking? Authorizing Provider  acetaminophen (TYLENOL) 500 MG tablet Take 500 mg by mouth every 6 (six) hours as needed. For pain/headache   Yes Historical Provider, MD  albuterol (PROVENTIL HFA;VENTOLIN HFA) 108 (90 BASE) MCG/ACT inhaler Inhale 2 puffs into the lungs every 6 (six) hours as needed for wheezing or shortness of breath. 06/23/13  Yes Lendon Colonel, NP   allopurinol (ZYLOPRIM) 100 MG tablet Take 100 mg by mouth 2 (two) times daily.  09/19/11  Yes [name], MD  aspirin EC 81 MG tablet Take 81 mg by mouth at bedtime.    Yes Historical Provider, MD  B Complex-C-Folic Acid (DIALYVITE TABLET) TABS Take 1 tablet by mouth at bedtime.  05/24/12  Yes Historical Provider, MD  calcium carbonate (TUMS - DOSED IN MG ELEMENTAL CALCIUM) 500 MG chewable tablet Chew 1 tablet by mouth daily as needed for heartburn. For heartburn   Yes Historical Provider, MD  cinacalcet (SENSIPAR) 30 MG tablet Take 30 mg by mouth daily with breakfast.    Yes Historical Provider, MD  colchicine 0.6 MG tablet Take 0.6 mg by mouth 2 (two) times daily as needed (for gout).   Yes Historical Provider, MD  docusate sodium (COLACE) 100 MG capsule Take 100 mg by mouth daily as needed for mild constipation.    Yes Historical Provider, MD  econazole nitrate 1 % cream Apply 1 application topically daily as needed (Athletes foot).   Yes Historical Provider, MD  Emollient (LUBRIDERM SERIOUSLY SENSITIVE) LOTN Apply 1 application topically daily.   Yes Historical Provider, MD  gabapentin (NEURONTIN) 100 MG capsule Take 100-200 mg by mouth 2 (two) times daily. Takes 1 in the morning and 2 at night   Yes Historical Provider, MD  midodrine (PROAMATINE) 10 MG tablet Take 10-20 mg by mouth See admin instructions. On Dialysis days (M,W,F), patient takes 2 tabs in the morning and 1 in the evening and on the other days patient takes 1 tablet in the morning and 2 in teh evening   Yes Historical Provider, MD  nitroGLYCERIN (NITROSTAT) 0.4 MG SL tablet Place 1 tablet (0.4 mg total) under the tongue every 5 (five) minutes as needed for chest pain. 11/24/14  Yes Lendon Colonel, NP  oxyCODONE-acetaminophen (PERCOCET/ROXICET) 5-325 MG per tablet Take one tablet by mouth every 6 hours as needed for pain 11/05/14  Yes Lauree Chandler, NP  polyethylene glycol powder (MIRALAX) powder Take 17 g by mouth  at bedtime.    Yes Historical Provider, MD  Pramoxine-Menthol (GOLD BOND MAXIMUM RELIEF EX) Apply 1 application topically daily as needed (Itching).   Yes Historical Provider, MD  sevelamer (RENVELA) 800 MG tablet Take 800-1,600 mg by mouth See admin instructions. Take 2 tabs with each meals and 1 tab with snacks   Yes Historical Provider, MD  Tbo-Filgrastim (GRANIX) 480 MCG/0.8ML SOSY injection Inject 480 mcg into the skin once a week. Tuesdays   Yes Historical Provider, MD   Liver Function Tests  Recent Labs Lab 11/27/14 0445 11/28/14 0238  AST 38*  --   ALT 32  --   ALKPHOS 146*  --   BILITOT 0.5  --   PROT 6.3  --   ALBUMIN 3.1* 3.3*   No results for input(s): LIPASE, AMYLASE in the last 168 hours. CBC  Recent Labs Lab 11/24/14 1053 11/27/14 0445 11/27/14 2248  WBC 1.4* 5.0 3.4*  NEUTROABS 0.4* 3.3  --   HGB 9.9* 8.8* 8.5*  HCT 31.3* 27.3* 25.9*  MCV 102.8* 104.6* 104.4*  PLT 82* 90* 790*   Basic Metabolic Panel  Recent Labs Lab 11/27/14 0445 11/28/14 0238  NA 136 139  K 4.0 5.4*  CL 97 99  CO2 28 28  GLUCOSE 101* 83  BUN 30* 52*  CREATININE 6.08* 7.68*  CALCIUM 8.4 8.3*  PHOS  --  4.0    Filed Vitals:   11/27/14 1430 11/27/14 1715 11/27/14 2056 11/28/14 0458  BP: 119/50 148/60 139/56 152/79  Pulse: 77  88 84 86  Temp: 98 F (36.7 C)  98.4 F (36.9 C) 97.5 F (36.4 C)  TempSrc: Oral  Oral Oral  Resp: $Remo'18  18 18  'YyNLn$ Height:      Weight:    77.202 kg (170 lb 3.2 oz)  SpO2: 100%  99% 100%   Exam Elderly WM, HOH, no distress, up in chair No rash, cyanosis or gangrene Sclera anicteric, throat clear No jvd Chest clear on R , rales on L 1/2 up RRR 2/6 SEM, no RG Abd soft, NTND, no mass or ascites Trace bilat pretib edema L thigh AVG patent, healing open wound 1 x 4 cm, over lateral aspect of AVG w/o signs of infection Neuro somewhat frail , nonfocal, HOH, pleasant  CXR - in ED, +vasc congestion  HD: MWF [facility] 4h   76.5kg   2/3.5 [place]  Heparin  1500   L thigh AVG (use medial aspect until wounds healed) Recent hx L thigh AVG: Aranesp 150/wk,  Hectorol 3 ug TIW Hb /tsat/ ferr > 9.9 / 39% / * Ca/ P / pth > 8.7 / 3.4 / 308   Assessment: 1. Dyspnea - better after nebs, O2, SL NTG, but CXR suggesting early vol overload. Suspect volume is primary issue, and it usually has been for this patient in the past.  He is close to dry weight, but may have lost weight.  Will do HD today with UF as tolerated.  No EKG changes.  Trop + but somewhat flat.   2. ESRD on HD 3. Removal infected L thigh AVG - wounds healing, using medial limb until fully healed 4. CABG 5. Anemia on aranesp 6. MBD cont vit D, renvela, sensipar 7. Chronic hypotension on midodrine tid 8. Prior smoker 9. Hx CVA 10. Hx bladder Ca   Plan- HD today, max UF, lower dry wt as tolerated.   Kelly Splinter MD (pgr) [phone]    ([id number] 11/28/2014, 11:55 AM

## 2014-11-28 NOTE — Progress Notes (Signed)
Pt received HD w/ no complications. Alert, vss, returned to 3E04. Report called to [employer]. RN

## 2014-11-28 NOTE — Progress Notes (Signed)
Pt returned from dialysis, awake and alert in no distress. Access site lt leg D&I.

## 2014-11-29 NOTE — Consult Note (Signed)
Asked by Dr Jonnie Finner to see pt for suture removal  Pt s/p removal of non functional left thigh graft by Dr Trula Slade 28 d ago Has functioning graft laterally with small open wound medial aspect adjacent to functioning arterial limb wound is 3 cm length less than 2 mm depth healthy granulation no erythema  2 sutures distal end of wound  Ok to remove sutures Hydrogel dressing once daily to open area Pt needs follow up with Dr Trula Slade 1 month  Ruta Hinds, MD [facility]: [phone] Pager: [phone]

## 2014-11-29 NOTE — Progress Notes (Signed)
Patient Demographics  Andrew [name], is a 81 y.o. male, DOB - [birth year], ZYY:[id number]  Admit date - 11/27/2014   Admitting Physician Cherene Altes, MD  Outpatient Primary MD for the patient is Andrew [name]., MD  LOS - 2   Chief Complaint  Patient presents with  . Shortness of Breath  . Chest Pain        Subjective:   [name] today has, No headache, No chest pain, No abdominal pain - No Nausea, No new weakness tingling or numbness, No Cough - SOB.    Assessment & Plan    1. Acute on chronic combined systolic and diastolic CHF EF 48% secondary to delay in dialysis. Also had nonspecific chest pain with non-ACS [place] troponin bump.  Renal has been consulted for dialysis  on 11/28/2013, patient is currently symptom free with minimal shortness of breath and no chest pain, EKG is nonacute, he had a left heart cath in November 2015 which showed patent bypass vessels. He is on aspirin which will be continued, history of low blood pressure and not able to tolerate beta blocker, continue supportive care. Outpatient follow-up with cardiology postdischarge.   Of note last admission one week ago his troponin bumped to 0.15 and was seen by cardiology and no further treatment or investigation was advised by Dr. Gwenlyn Found.     2. ESRD. Monday Wednesday Friday schedule, was dialyzed on Saturday, discussed with nephrologist Dr.Schertz will need another dialysis run morning of 11/30/2014.    3. Anemia of chronic disease. Stable goal to keep hemoglobin above 8 in the setting of #1 above.    4. CAD. Plan as in #1 above, blood pressure too low for beta blocker, chest pain-free, EKG nonacute, continue aspirin, non-ACS pattern troponin bump secondary to volume overload and CHF.    5. Chronically  low blood pressures. Continue midodrine.    6. Peripheral neuropathy. On Neurontin.    7. Moderate aortic stenosis. Outpatient follow-up with cardiology.    8.Pulm HTN - supportive care.       Code Status: Full  Family Communication: none  Disposition Plan: Home   Procedures   TTE Nov 2015  - Left ventricle: The cavity size was at the upper limits ofnormal. Wall thickness was normal. The estimated ejectionfraction was 45%. There is akinesis of the basal-midinferolateraland inferior myocardium. Features are consistent with a pseudonormal left ventricular filling pattern, with concomitantabnormal relaxation and increased filling pressure (grade 2diastolic dysfunction). - Aortic valve: Cusp separation was moderately reduced. There wasmoderate stenosis. Valve area (VTI): 1.15 cm^2. Valve area (Vmax): 1.1 cm^2. Valve area (Vmean): 1.14 cm^2. - Left atrium: The atrium was mildly dilated. - Right atrium: The atrium was mildly dilated.   L Heart Cath Nov 2015  Cath: LM 99d, LADnl, LCX nl, RCA 100p, LIMA->LAD nl, VG->OM2->OM3 nl, VG->RPL nl, EF 40-45%.   Consults  Renal   Medications  Scheduled Meds: . allopurinol  100 mg Oral BID  . aspirin EC  81 mg Oral QHS  . cinacalcet  30 mg Oral Q breakfast  . [START ON 12/02/2014] darbepoetin (ARANESP) injection - DIALYSIS  150 mcg Intravenous Q Wed-HD  . [START ON 11/30/2014] doxercalciferol  3 mcg Intravenous Q M,W,F-HD  . gabapentin  100  mg Oral Daily  . gabapentin  200 mg Oral QHS  . heparin  5,000 Units Subcutaneous 3 times per day  . midodrine  10 mg Oral TID WC  . multivitamin  1 tablet Oral QHS  . polyethylene glycol  17 g Oral Daily  . sevelamer carbonate  1,600 mg Oral TID WC   Continuous Infusions:  PRN Meds:.sodium chloride, acetaminophen, calcium carbonate, colchicine, docusate sodium, nitroGLYCERIN, oxyCODONE-acetaminophen, sevelamer carbonate  DVT Prophylaxis    Heparin   Lab Results  Component  Value Date   PLT 101* 11/27/2014    Antibiotics     Anti-infectives    None          Objective:   Filed Vitals:   11/28/14 1811 11/28/14 1838 11/28/14 2022 11/29/14 0500  BP: 105/50 111/40 101/42 130/54  Pulse: 90  85 91  Temp: 97.4 F (36.3 C) 97.5 F (36.4 C) 98.3 F (36.8 C) 97.7 F (36.5 C)  TempSrc: Oral Oral Oral Oral  Resp: 25  18 18   Height:      Weight: 74.1 kg (163 lb 5.8 oz)   75.2 kg (165 lb 12.6 oz)  SpO2: 100% 100% 100% 92%    Wt Readings from Last 3 Encounters:  11/29/14 75.2 kg (165 lb 12.6 oz)  11/24/14 77.565 kg (171 lb)  11/19/14 72.6 kg (160 lb 0.9 oz)     Intake/Output Summary (Last 24 hours) at 11/29/14 0934 Last data filed at 11/29/14 0840  Gross per 24 hour  Intake    580 ml  Output   1967 ml  Net  -1387 ml     Physical Exam  Awake Alert, Oriented X 3, No new F.N deficits, Normal affect .AT,PERRAL Supple Neck,No JVD, No cervical lymphadenopathy appriciated.  Symmetrical Chest wall movement, Good air movement bilaterally, +ve Rales RRR,No Gallops,Rubs or new Murmurs, No Parasternal Heave +ve B.Sounds, Abd Soft, No tenderness, No organomegaly appriciated, No rebound - guarding or rigidity. No Cyanosis, Clubbing or edema, No new Rash or bruise      Data Review   Micro Results Recent Results (from the past 240 hour(s))  MRSA PCR Screening     Status: None   Collection Time: 11/27/14 11:12 AM  Result Value Ref Range Status   MRSA by PCR NEGATIVE NEGATIVE Final    Comment:        The GeneXpert MRSA Assay (FDA approved for NASAL specimens only), is one component of a comprehensive MRSA colonization surveillance program. It is not intended to diagnose MRSA infection nor to guide or monitor treatment for MRSA infections.     Radiology Reports Dg Chest 2 View  11/27/2014   CLINICAL DATA:  Shortness of breath and chest pain  EXAM: CHEST  2 VIEW  COMPARISON:  11/18/2014  FINDINGS: Small bilateral pleural effusions. No  overt pulmonary edema. Mild cardiomegaly that is stable from prior. The aorta and hila are negative. The patient is status post CABG. Surgical changes to the left axilla. Prior right subclavian stenting.  IMPRESSION: Pulmonary venous congestion with small pleural effusions.   Electronically Signed   By: Jorje Guild M.D.   On: 11/27/2014 06:16       CBC  Recent Labs Lab 11/24/14 1053 11/27/14 0445 11/27/14 2248  WBC 1.4* 5.0 3.4*  HGB 9.9* 8.8* 8.5*  HCT 31.3* 27.3* 25.9*  PLT 82* 90* 101*  MCV 102.8* 104.6* 104.4*  MCH 32.5 33.7 34.3*  MCHC 31.6* 32.2 32.8  RDW 24.1* 22.3* 22.5*  LYMPHSABS  0.7* 1.3  --   MONOABS 0.2 0.3  --   EOSABS 0.1 0.1  --   BASOSABS 0.0 0.0  --     Chemistries   Recent Labs Lab 11/27/14 0445 11/28/14 0238  NA 136 139  K 4.0 5.4*  CL 97 99  CO2 28 28  GLUCOSE 101* 83  BUN 30* 52*  CREATININE 6.08* 7.68*  CALCIUM 8.4 8.3*  AST 38*  --   ALT 32  --   ALKPHOS 146*  --   BILITOT 0.5  --    ------------------------------------------------------------------------------------------------------------------ estimated creatinine clearance is 6.7 mL/min (by C-G formula based on Cr of 7.68). ------------------------------------------------------------------------------------------------------------------ No results for input(s): HGBA1C in the last 72 hours. ------------------------------------------------------------------------------------------------------------------ No results for input(s): CHOL, HDL, LDLCALC, TRIG, CHOLHDL, LDLDIRECT in the last 72 hours. ------------------------------------------------------------------------------------------------------------------ No results for input(s): TSH, T4TOTAL, T3FREE, THYROIDAB in the last 72 hours.  Invalid input(s): FREET3 ------------------------------------------------------------------------------------------------------------------ No results for input(s): VITAMINB12, FOLATE, FERRITIN, TIBC,  IRON, RETICCTPCT in the last 72 hours.  Coagulation profile No results for input(s): INR, PROTIME in the last 168 hours.  No results for input(s): DDIMER in the last 72 hours.  Cardiac Enzymes  Recent Labs Lab 11/27/14 1822 11/27/14 2248 11/28/14 0238  TROPONINI 0.23* 0.22* 0.20*   ------------------------------------------------------------------------------------------------------------------ Invalid input(s): POCBNP     Time Spent in minutes  35   Armoni Depass K M.D on 11/29/2014 at 9:34 AM  Between 7am to 7pm - Pager - [phone]  After 7pm go to [email or web address] - [facility] Office  [phone]

## 2014-11-29 NOTE — Progress Notes (Signed)
[facility] Progress Note   Subjective: Alert, SOB better, 2kg off with HD yest, wt down 75kg today  Filed Vitals:   11/28/14 1811 11/28/14 1838 11/28/14 2022 11/29/14 0500  BP: 105/50 111/40 101/42 130/54  Pulse: 90  85 91  Temp: 97.4 F (36.3 C) 97.5 F (36.4 C) 98.3 F (36.8 C) 97.7 F (36.5 C)  TempSrc: Oral Oral Oral Oral  Resp: 25  18 18   Height:      Weight: 74.1 kg (163 lb 5.8 oz)   75.2 kg (165 lb 12.6 oz)  SpO2: 100% 100% 100% 92%   Exam: Elderly WM, HOH, no distress, up in chair No rash, cyanosis or gangrene Sclera anicteric, throat clear No jvd Chest clear on R , rales on L 1/2 up RRR 2/6 SEM, no RG Abd soft, NTND, no mass or ascites Trace bilat pretib edema L thigh AVG patent, healing open wound 1 x 4 cm, over lateral aspect of AVG w/o signs of infection Neuro somewhat frail , nonfocal, HOH, pleasant  CXR - in ED, +vasc congestion  HD: MWF [facility] 4h 76.5kg 2/3.5 [place] Heparin 1500 L thigh AVG (use medial aspect until wounds healed) Aranesp 150/wk, Hectorol 3 ug TIW Hb /tsat/ ferr > 9.9 / 39% / * Ca/ P / pth > 8.7 / 3.4 / 308   Assessment: 1. Dyspnea - possibly volume-related, a little better after HD yesterday.  Plan HD again in am and try to lower dry wt a little further.  2. ESRD on HD 3. Recent removal infected L thigh AVG - wounds healing, using medial limb until fully healed 4. Hx CABG 5. Anemia on aranesp 6. MBD cont vit D, renvela, sensipar 7. Chronic hypotension on midodrine tid 8. Prior smoker 9. Hx CVA 10. Hx bladder Ca 11. EOL - I have d/w pt's daughter that CPR is probably futile in this pt with so many comorbidities, she said she will took to Mr Cocuzza about it.    Plan - HD first shift tomorrow, probably can be dc'd after that.  Will call [facility] to see if they can take sutures out of left leg today, had an appt for this tomorrow at clinic which he will miss.  Lower dry wt as tolerated.     Kelly Splinter MD  pager  [phone]    cell [phone]  11/29/2014, 10:18 AM     Recent Labs Lab 11/27/14 0445 11/28/14 0238  NA 136 139  K 4.0 5.4*  CL 97 99  CO2 28 28  GLUCOSE 101* 83  BUN 30* 52*  CREATININE 6.08* 7.68*  CALCIUM 8.4 8.3*  PHOS  --  4.0    Recent Labs Lab 11/27/14 0445 11/28/14 0238  AST 38*  --   ALT 32  --   ALKPHOS 146*  --   BILITOT 0.5  --   PROT 6.3  --   ALBUMIN 3.1* 3.3*    Recent Labs Lab 11/24/14 1053 11/27/14 0445 11/27/14 2248  WBC 1.4* 5.0 3.4*  NEUTROABS 0.4* 3.3  --   HGB 9.9* 8.8* 8.5*  HCT 31.3* 27.3* 25.9*  MCV 102.8* 104.6* 104.4*  PLT 82* 90* 101*   . allopurinol  100 mg Oral BID  . aspirin EC  81 mg Oral QHS  . cinacalcet  30 mg Oral Q breakfast  . [START ON 12/02/2014] darbepoetin (ARANESP) injection - DIALYSIS  150 mcg Intravenous Q Wed-HD  . [START ON 11/30/2014] doxercalciferol  3 mcg Intravenous Q M,W,F-HD  .  gabapentin  100 mg Oral Daily  . gabapentin  200 mg Oral QHS  . heparin  5,000 Units Subcutaneous 3 times per day  . midodrine  10 mg Oral TID WC  . multivitamin  1 tablet Oral QHS  . polyethylene glycol  17 g Oral Daily  . sevelamer carbonate  1,600 mg Oral TID WC     sodium chloride, acetaminophen, calcium carbonate, colchicine, docusate sodium, nitroGLYCERIN, oxyCODONE-acetaminophen, sevelamer carbonate

## 2014-11-30 ENCOUNTER — Telehealth: Payer: Self-pay | Admitting: Surgery

## 2014-11-30 DIAGNOSIS — N186 End stage renal disease: Secondary | ICD-10-CM

## 2014-11-30 DIAGNOSIS — D630 Anemia in neoplastic disease: Secondary | ICD-10-CM

## 2014-11-30 DIAGNOSIS — I5043 Acute on chronic combined systolic (congestive) and diastolic (congestive) heart failure: Secondary | ICD-10-CM | POA: Diagnosis present

## 2014-11-30 DIAGNOSIS — I739 Peripheral vascular disease, unspecified: Secondary | ICD-10-CM

## 2014-11-30 LAB — CBC
HEMATOCRIT: 25 % — AB (ref 39.0–52.0)
HEMOGLOBIN: 7.8 g/dL — AB (ref 13.0–17.0)
MCH: 31.8 pg (ref 26.0–34.0)
MCHC: 31.2 g/dL (ref 30.0–36.0)
MCV: 102 fL — ABNORMAL HIGH (ref 78.0–100.0)
Platelets: 126 10*3/uL — ABNORMAL LOW (ref 150–400)
RBC: 2.45 MIL/uL — AB (ref 4.22–5.81)
RDW: 22.6 % — ABNORMAL HIGH (ref 11.5–15.5)
WBC: 2.1 10*3/uL — ABNORMAL LOW (ref 4.0–10.5)

## 2014-11-30 LAB — RENAL FUNCTION PANEL
Albumin: 2.9 g/dL — ABNORMAL LOW (ref 3.5–5.2)
Anion gap: 10 (ref 5–15)
BUN: 52 mg/dL — AB (ref 6–23)
CO2: 27 mmol/L (ref 19–32)
Calcium: 8.1 mg/dL — ABNORMAL LOW (ref 8.4–10.5)
Chloride: 101 mEq/L (ref 96–112)
Creatinine, Ser: 7.54 mg/dL — ABNORMAL HIGH (ref 0.50–1.35)
GFR calc Af Amer: 7 mL/min — ABNORMAL LOW (ref >90)
GFR calc non Af Amer: 6 mL/min — ABNORMAL LOW (ref >90)
Glucose, Bld: 115 mg/dL — ABNORMAL HIGH (ref 70–99)
PHOSPHORUS: 3.4 mg/dL (ref 2.3–4.6)
POTASSIUM: 4.4 mmol/L (ref 3.5–5.1)
Sodium: 138 mmol/L (ref 135–145)

## 2014-11-30 LAB — HEPATITIS B SURFACE ANTIGEN: Hepatitis B Surface Ag: NEGATIVE

## 2014-11-30 MED ORDER — DOXERCALCIFEROL 4 MCG/2ML IV SOLN
INTRAVENOUS | Status: AC
  Administered 2014-11-30: 3 ug via INTRAVENOUS
  Filled 2014-11-30: qty 2

## 2014-11-30 MED ORDER — DARBEPOETIN ALFA 150 MCG/0.3ML IJ SOSY
PREFILLED_SYRINGE | INTRAMUSCULAR | Status: AC
  Filled 2014-11-30: qty 0.3

## 2014-11-30 NOTE — Procedures (Signed)
Pt seen on HD.  Ap 160 Vp 170.  BFR 400.  Breathing well.  Says he is ready to go home.  OK to DC from renal standpoint

## 2014-11-30 NOTE — Care Management Note (Signed)
    Page 1 of 1   11/30/2014     2:49:39 PM CARE MANAGEMENT NOTE 11/30/2014  Patient:  Andrew [name], Andrew [name]   Account Number:  [id number]  Date Initiated:  11/30/2014  Documentation initiated by:  Leverne Tessler  Subjective/Objective Assessment:   Pt adm on 11/27/13 with CP, SOB.  PTA, pt resides at home with daughter.  He attends HD on MWF.     Action/Plan:   Pt for dc home today.  No dc needs identified.   Anticipated DC Date:  11/30/2014   Anticipated DC Plan:  [place]  CM consult      Choice offered to / List presented to:             Status of service:  Completed, signed off Medicare Important Message given?  YES (If response is "NO", the following Medicare IM given date fields will be blank) Date Medicare IM given:  11/30/2014 Medicare IM given by:  Zi Sek Date Additional Medicare IM given:   Additional Medicare IM given by:    Discharge Disposition:  HOME/SELF CARE  Per UR Regulation:  Reviewed for med. necessity/level of care/duration of stay  If discussed at [place] of Stay Meetings, dates discussed:    Comments:

## 2014-11-30 NOTE — Discharge Summary (Signed)
Physician Discharge Summary  Andrew [name] FXT:[id number] DOB: 1926/07/22 DOA: 11/27/2014  PCP: [name]., MD  Admit date: 11/27/2014 Discharge date: 11/30/2014  Time spent: 35 minutes  Recommendations for Outpatient Follow-up:  1. Please follow up on volume status, patient fluid overloaded, undergoing dialysis during this hospitalization   Discharge Diagnoses:  Principal Problem:   Dyspnea Active Problems:   ESRD (end stage renal disease)   Acute on chronic combined systolic and diastolic heart failure   Anemia in neoplastic disease   Peripheral vascular disease   CAD (coronary artery disease)   CHF (congestive heart failure)   Discharge Condition: Stable  Diet recommendation: Renal Diet  Filed Weights   11/29/14 0500 11/30/14 0556 11/30/14 0830  Weight: 75.2 kg (165 lb 12.6 oz) 77.6 kg (171 lb 1.2 oz) 77.6 kg (171 lb 1.2 oz)    History of present illness:  81 year old male with ESRD on hemodialysis MWF, myelodysplastic syndrome, infected left high AVG s/p removal (Dr. Trula Slade) who presented to ED with c/o dyspnea and chest pain, similar to his admission 12/23 - 11/19/2014.  He denies any recent change in his medication regimen, or missing his scheduled HD appointments, but does admit to having "a little holiday food" outside of his restrictive renal diet.  Of note he states that his sx did improve markedly at one point at home w/ use of a SL nitro.  Hospital Course:   patient is a pleasant 81 year old gentleman with a past medical history of end-stage renal disease on hemodialysis Mondays Wednesdays and Fridays, history of myelodysplastic syndrome, chronic combined systolic and diastolic congestive heart failure who was admitted to the medicine service on  11/27/2014  Presented with complaints of worsening shortness of breath.  Initial workup included a chest x-ray which revealed evidence of pulmonary venous congestion and small pleural effusions.  Symptoms felt to be  secondary to 5 overload. During this hospitalization he was seen and evaluated by nephrology, undergoing hemodialysis on 11/28/2014 and 11/30/2014.  Patient reported feeling better after hemodialysis.  After receiving HD on 11/30/2014 he was cleared for discharge from Dr Etheleen Nicks standpoint. Patient reported feeling significantly, having clear lung sounds on exam. He was discharge to his home in stable condition on 11/30/2014.    Procedures:   Hemodialysis  Consultations:   Nephrology  Discharge Exam: Filed Vitals:   11/30/14 1220  BP: 111/57  Pulse:   Temp:   Resp: 19    General:  Nontoxic appearing, patient awake alert oriented 3 Cardiovascular:  Regular rate and rhythm normal S1-S2 Respiratory:  Normal respiratory effort, lungs are clear to auscultation bilaterally Abdomen:  Soft nontender nondistended Extremities:  No edema  Discharge Instructions   Discharge Instructions    Call MD for:  difficulty breathing, headache or visual disturbances    Complete by:  As directed      Call MD for:  extreme fatigue    Complete by:  As directed      Call MD for:  hives    Complete by:  As directed      Call MD for:  persistant dizziness or light-headedness    Complete by:  As directed      Call MD for:  persistant nausea and vomiting    Complete by:  As directed      Call MD for:  redness, tenderness, or signs of infection (pain, swelling, redness, odor or green/yellow discharge around incision site)    Complete by:  As directed  Call MD for:  severe uncontrolled pain    Complete by:  As directed      Call MD for:  temperature >100.4    Complete by:  As directed      Diet - low sodium heart healthy    Complete by:  As directed      Increase activity slowly    Complete by:  As directed           Current Discharge Medication List    CONTINUE these medications which have NOT CHANGED   Details  acetaminophen (TYLENOL) 500 MG tablet Take 500 mg by mouth every 6 (six)  hours as needed. For pain/headache    albuterol (PROVENTIL HFA;VENTOLIN HFA) 108 (90 BASE) MCG/ACT inhaler Inhale 2 puffs into the lungs every 6 (six) hours as needed for wheezing or shortness of breath. Qty: 1 Inhaler, Refills: 0    allopurinol (ZYLOPRIM) 100 MG tablet Take 100 mg by mouth 2 (two) times daily.     aspirin EC 81 MG tablet Take 81 mg by mouth at bedtime.     B Complex-C-Folic Acid (DIALYVITE TABLET) TABS Take 1 tablet by mouth at bedtime.     calcium carbonate (TUMS - DOSED IN MG ELEMENTAL CALCIUM) 500 MG chewable tablet Chew 1 tablet by mouth daily as needed for heartburn. For heartburn    cinacalcet (SENSIPAR) 30 MG tablet Take 30 mg by mouth daily with breakfast.     colchicine 0.6 MG tablet Take 0.6 mg by mouth 2 (two) times daily as needed (for gout).    docusate sodium (COLACE) 100 MG capsule Take 100 mg by mouth daily as needed for mild constipation.     econazole nitrate 1 % cream Apply 1 application topically daily as needed (Athletes foot).    Emollient (LUBRIDERM SERIOUSLY SENSITIVE) LOTN Apply 1 application topically daily.    gabapentin (NEURONTIN) 100 MG capsule Take 100-200 mg by mouth 2 (two) times daily. Takes 1 in the morning and 2 at night    midodrine (PROAMATINE) 10 MG tablet Take 10-20 mg by mouth See admin instructions. On Dialysis days (M,W,F), patient takes 2 tabs in the morning and 1 in the evening and on the other days patient takes 1 tablet in the morning and 2 in teh evening    nitroGLYCERIN (NITROSTAT) 0.4 MG SL tablet Place 1 tablet (0.4 mg total) under the tongue every 5 (five) minutes as needed for chest pain. Qty: 90 tablet, Refills: 3    oxyCODONE-acetaminophen (PERCOCET/ROXICET) 5-325 MG per tablet Take one tablet by mouth every 6 hours as needed for pain Qty: 120 tablet, Refills: 0    polyethylene glycol powder (MIRALAX) powder Take 17 g by mouth at bedtime.     Pramoxine-Menthol (GOLD BOND MAXIMUM RELIEF EX) Apply 1 application  topically daily as needed (Itching).    sevelamer (RENVELA) 800 MG tablet Take 800-1,600 mg by mouth See admin instructions. Take 2 tabs with each meals and 1 tab with snacks      STOP taking these medications     Tbo-Filgrastim (GRANIX) 480 MCG/0.8ML SOSY injection        No Known Allergies Follow-up Information    Follow up with Trula Slade IV, [place], MD In 4 weeks.   Specialty:  Vascular Surgery   Why:  Office will call you to arrange your appt (sent)   Contact information:   [place] [id number] [phone]        The results of significant  diagnostics from this hospitalization (including imaging, microbiology, ancillary and laboratory) are listed below for reference.    Significant Diagnostic Studies: Dg Chest 2 View  11/27/2014   CLINICAL DATA:  Shortness of breath and chest pain  EXAM: CHEST  2 VIEW  COMPARISON:  11/18/2014  FINDINGS: Small bilateral pleural effusions. No overt pulmonary edema. Mild cardiomegaly that is stable from prior. The aorta and hila are negative. The patient is status post CABG. Surgical changes to the left axilla. Prior right subclavian stenting.  IMPRESSION: Pulmonary venous congestion with small pleural effusions.   Electronically Signed   By: Jorje Guild M.D.   On: 11/27/2014 06:16   Dg Chest 2 View  11/07/2014   CLINICAL DATA:  81 year old male with cough and positive PPD the  EXAM: CHEST  2 VIEW  COMPARISON:  Prior chest x-ray 10/29/2014  FINDINGS: Stable mild cardiomegaly. Patient is status post median sternotomy with evidence of multivessel CABG. Atherosclerotic calcification present in the transverse aorta. No focal airspace consolidation, pulmonary edema, pleural effusion or pneumothorax. Stable bronchitic change and a mild prominence of the interstitial markings. Similar configuration of right axillary vein flare stent graft with possible fracture versus infolding inferiorly. No acute osseous abnormality.  IMPRESSION: Stable  chest x-ray without evidence of acute cardiopulmonary process.   Electronically Signed   By: Jacqulynn Cadet M.D.   On: 11/07/2014 09:55   Dg Chest Port 1 View  11/18/2014   CLINICAL DATA:  Chest pain, tightness  EXAM: PORTABLE CHEST - 1 VIEW  COMPARISON:  11/07/2014  FINDINGS: There are bilateral interstitial and alveolar airspace opacities. There is no significant pleural effusion or pneumothorax. There is mild stable cardiomegaly. There is evidence of prior CABG. There is no acute osseous abnormality.  IMPRESSION: Overall appearance concerning for mild pulmonary edema.   Electronically Signed   By: Kathreen Devoid   On: 11/18/2014 12:39    Microbiology: Recent Results (from the past 240 hour(s))  MRSA PCR Screening     Status: None   Collection Time: 11/27/14 11:12 AM  Result Value Ref Range Status   MRSA by PCR NEGATIVE NEGATIVE Final    Comment:        The GeneXpert MRSA Assay (FDA approved for NASAL specimens only), is one component of a comprehensive MRSA colonization surveillance program. It is not intended to diagnose MRSA infection nor to guide or monitor treatment for MRSA infections.      Labs: Basic Metabolic Panel:  Recent Labs Lab 11/27/14 0445 11/28/14 0238 11/30/14 0905  NA 136 139 138  K 4.0 5.4* 4.4  CL 97 99 101  CO2 28 28 27   GLUCOSE 101* 83 115*  BUN 30* 52* 52*  CREATININE 6.08* 7.68* 7.54*  CALCIUM 8.4 8.3* 8.1*  PHOS  --  4.0 3.4   Liver Function Tests:  Recent Labs Lab 11/27/14 0445 11/28/14 0238 11/30/14 0905  AST 38*  --   --   ALT 32  --   --   ALKPHOS 146*  --   --   BILITOT 0.5  --   --   PROT 6.3  --   --   ALBUMIN 3.1* 3.3* 2.9*   No results for input(s): LIPASE, AMYLASE in the last 168 hours. No results for input(s): AMMONIA in the last 168 hours. CBC:  Recent Labs Lab 11/24/14 1053 11/27/14 0445 11/27/14 2248 11/30/14 0900  WBC 1.4* 5.0 3.4* 2.1*  NEUTROABS 0.4* 3.3  --   --   HGB 9.9*  8.8* 8.5* 7.8*  HCT 31.3*  27.3* 25.9* 25.0*  MCV 102.8* 104.6* 104.4* 102.0*  PLT 82* 90* 101* 126*   Cardiac Enzymes:  Recent Labs Lab 11/27/14 0445 11/27/14 1822 11/27/14 2248 11/28/14 0238  TROPONINI 0.07* 0.23* 0.22* 0.20*   BNP: BNP (last 3 results)  Recent Labs  10/23/14 1117  PROBNP 30772.0*   CBG: No results for input(s): GLUCAP in the last 168 hours.     SignedKelvin Cellar  [facility] 11/30/2014, 1:34 PM

## 2014-11-30 NOTE — Progress Notes (Signed)
Patient taken out for discharge via wheelchair. Daughter taking patient home.

## 2014-11-30 NOTE — Telephone Encounter (Addendum)
-----   Message from Elam Dutch, MD sent at 11/29/2014  5:09 PM EST ----- Please scheudule appt [facility] 1 month for wound check left thigh graft removal  Ruta Hinds   11/30/14: patient was already scheduled, dpm

## 2014-11-30 NOTE — Progress Notes (Signed)
Cardiac monitor discontinued. CCMD notified. Home discharge instructions discussed with patient and daughter. Follow up appt, diet and activity discussed. Patient verbally understands  by using teachback.

## 2014-12-01 ENCOUNTER — Other Ambulatory Visit (HOSPITAL_BASED_OUTPATIENT_CLINIC_OR_DEPARTMENT_OTHER): Payer: Medicare Other

## 2014-12-01 ENCOUNTER — Ambulatory Visit (HOSPITAL_BASED_OUTPATIENT_CLINIC_OR_DEPARTMENT_OTHER): Payer: Medicare Other

## 2014-12-01 ENCOUNTER — Telehealth: Payer: Self-pay | Admitting: Surgery

## 2014-12-01 DIAGNOSIS — D46C Myelodysplastic syndrome with isolated del(5q) chromosomal abnormality: Secondary | ICD-10-CM

## 2014-12-01 DIAGNOSIS — D649 Anemia, unspecified: Secondary | ICD-10-CM

## 2014-12-01 DIAGNOSIS — T451X5A Adverse effect of antineoplastic and immunosuppressive drugs, initial encounter: Principal | ICD-10-CM

## 2014-12-01 DIAGNOSIS — D701 Agranulocytosis secondary to cancer chemotherapy: Secondary | ICD-10-CM

## 2014-12-01 LAB — CBC WITH DIFFERENTIAL/PLATELET
BASO%: 1.7 % (ref 0.0–2.0)
BASOS ABS: 0 10*3/uL (ref 0.0–0.1)
EOS%: 5.9 % (ref 0.0–7.0)
Eosinophils Absolute: 0.1 10*3/uL (ref 0.0–0.5)
HEMATOCRIT: 28.1 % — AB (ref 38.4–49.9)
HGB: 9 g/dL — ABNORMAL LOW (ref 13.0–17.1)
LYMPH#: 1.1 10*3/uL (ref 0.9–3.3)
LYMPH%: 43 % (ref 14.0–49.0)
MCH: 33.1 pg (ref 27.2–33.4)
MCHC: 32.1 g/dL (ref 32.0–36.0)
MCV: 103.2 fL — ABNORMAL HIGH (ref 79.3–98.0)
MONO#: 0.3 10*3/uL (ref 0.1–0.9)
MONO%: 12.9 % (ref 0.0–14.0)
NEUT%: 36.5 % — ABNORMAL LOW (ref 39.0–75.0)
NEUTROS ABS: 0.9 10*3/uL — AB (ref 1.5–6.5)
PLATELETS: 157 10*3/uL (ref 140–400)
RBC: 2.72 10*6/uL — ABNORMAL LOW (ref 4.20–5.82)
RDW: 24.3 % — AB (ref 11.0–14.6)
WBC: 2.5 10*3/uL — ABNORMAL LOW (ref 4.0–10.3)

## 2014-12-01 MED ORDER — TBO-FILGRASTIM 480 MCG/0.8ML ~~LOC~~ SOSY
480.0000 ug | PREFILLED_SYRINGE | Freq: Once | SUBCUTANEOUS | Status: AC
  Administered 2014-12-01: 480 ug via SUBCUTANEOUS
  Filled 2014-12-01: qty 0.8

## 2014-12-01 NOTE — Telephone Encounter (Signed)
-----   Message from Mena Goes, RN sent at 11/30/2014  3:13 PM EST ----- Regarding: Schedule See below message, make appt just to see [facility]  ----- Message -----    From: Gabriel Earing, PA-C    Sent: 11/30/2014   2:09 PM      To: Mena Goes, RN  As far as I know the sutures were taken out here in the hospital.  ----- Message -----    From: Mena Goes, RN    Sent: 11/30/2014  11:21 AM      To: Gabriel Earing, PA-C  When do these sutures need to be taken out? Will bring him in to see [facility] for this appt and then see [facility] in 1 month? OK  ----- Message -----    From: Gabriel Earing, PA-C    Sent: 11/30/2014   7:31 AM      To: [facility] Charge Pool  Pt s/p removal of non functional left thigh graft by Dr Trula Slade 28 d ago Has functioning graft laterally with small open wound medial aspect adjacent to functioning arterial limb wound is 3 cm length less than 2 mm depth healthy granulation no erythema  2 sutures distal end of wound  Ok to remove sutures Hydrogel dressing once daily to open area Pt needs follow up with Dr Trula Slade 1 month   Thanks!

## 2014-12-08 ENCOUNTER — Ambulatory Visit (HOSPITAL_BASED_OUTPATIENT_CLINIC_OR_DEPARTMENT_OTHER): Payer: Medicare Other

## 2014-12-08 ENCOUNTER — Other Ambulatory Visit: Payer: Self-pay | Admitting: *Deleted

## 2014-12-08 ENCOUNTER — Other Ambulatory Visit (HOSPITAL_BASED_OUTPATIENT_CLINIC_OR_DEPARTMENT_OTHER): Payer: Medicare Other

## 2014-12-08 ENCOUNTER — Ambulatory Visit: Payer: Medicare Other

## 2014-12-08 ENCOUNTER — Ambulatory Visit (HOSPITAL_COMMUNITY)
Admission: RE | Admit: 2014-12-08 | Discharge: 2014-12-08 | Disposition: A | Discharge status: Home / Self Care | Payer: Medicare Other | Source: Ambulatory Visit | Attending: Hematology and Oncology | Admitting: Hematology and Oncology

## 2014-12-08 VITALS — BP 134/51 | HR 90 | Temp 98.5°F | Resp 18

## 2014-12-08 DIAGNOSIS — D469 Myelodysplastic syndrome, unspecified: Secondary | ICD-10-CM | POA: Diagnosis not present

## 2014-12-08 DIAGNOSIS — D46C Myelodysplastic syndrome with isolated del(5q) chromosomal abnormality: Secondary | ICD-10-CM | POA: Insufficient documentation

## 2014-12-08 DIAGNOSIS — D701 Agranulocytosis secondary to cancer chemotherapy: Secondary | ICD-10-CM

## 2014-12-08 DIAGNOSIS — D630 Anemia in neoplastic disease: Secondary | ICD-10-CM | POA: Insufficient documentation

## 2014-12-08 DIAGNOSIS — N186 End stage renal disease: Secondary | ICD-10-CM | POA: Diagnosis not present

## 2014-12-08 DIAGNOSIS — D649 Anemia, unspecified: Secondary | ICD-10-CM

## 2014-12-08 DIAGNOSIS — T451X5A Adverse effect of antineoplastic and immunosuppressive drugs, initial encounter: Principal | ICD-10-CM

## 2014-12-08 LAB — CBC WITH DIFFERENTIAL/PLATELET
BASO%: 0.6 % (ref 0.0–2.0)
BASOS ABS: 0 10*3/uL (ref 0.0–0.1)
EOS ABS: 0.1 10*3/uL (ref 0.0–0.5)
EOS%: 7.2 % — ABNORMAL HIGH (ref 0.0–7.0)
HCT: 24.1 % — ABNORMAL LOW (ref 38.4–49.9)
HGB: 7.8 g/dL — ABNORMAL LOW (ref 13.0–17.1)
LYMPH%: 54.4 % — AB (ref 14.0–49.0)
MCH: 34.1 pg — ABNORMAL HIGH (ref 27.2–33.4)
MCHC: 32.4 g/dL (ref 32.0–36.0)
MCV: 105.2 fL — ABNORMAL HIGH (ref 79.3–98.0)
MONO#: 0.2 10*3/uL (ref 0.1–0.9)
MONO%: 11.7 % (ref 0.0–14.0)
NEUT#: 0.5 10*3/uL — CL (ref 1.5–6.5)
NEUT%: 26.1 % — AB (ref 39.0–75.0)
NRBC: 0 % (ref 0–0)
PLATELETS: 107 10*3/uL — AB (ref 140–400)
RBC: 2.29 10*6/uL — ABNORMAL LOW (ref 4.20–5.82)
RDW: 23.6 % — ABNORMAL HIGH (ref 11.0–14.6)
WBC: 1.8 10*3/uL — ABNORMAL LOW (ref 4.0–10.3)
lymph#: 1 10*3/uL (ref 0.9–3.3)

## 2014-12-08 LAB — HOLD TUBE, BLOOD BANK

## 2014-12-08 MED ORDER — SODIUM CHLORIDE 0.9 % IV SOLN
250.0000 mL | Freq: Once | INTRAVENOUS | Status: AC
  Administered 2014-12-08: 250 mL via INTRAVENOUS

## 2014-12-08 MED ORDER — TBO-FILGRASTIM 480 MCG/0.8ML ~~LOC~~ SOSY
480.0000 ug | PREFILLED_SYRINGE | Freq: Once | SUBCUTANEOUS | Status: AC
  Administered 2014-12-08: 480 ug via SUBCUTANEOUS
  Filled 2014-12-08: qty 0.8

## 2014-12-08 NOTE — Patient Instructions (Signed)
Good handwashing and stay away from people who are sick.

## 2014-12-08 NOTE — Patient Instructions (Signed)

## 2014-12-09 LAB — TYPE AND SCREEN
ABO/RH(D): O POS
ANTIBODY SCREEN: NEGATIVE
Unit division: 0

## 2014-12-15 ENCOUNTER — Other Ambulatory Visit: Payer: Self-pay | Admitting: *Deleted

## 2014-12-15 ENCOUNTER — Ambulatory Visit: Payer: Medicare Other

## 2014-12-15 ENCOUNTER — Other Ambulatory Visit: Payer: Self-pay | Admitting: Hematology and Oncology

## 2014-12-15 ENCOUNTER — Ambulatory Visit (HOSPITAL_BASED_OUTPATIENT_CLINIC_OR_DEPARTMENT_OTHER): Payer: Medicare Other

## 2014-12-15 ENCOUNTER — Telehealth: Payer: Self-pay | Admitting: *Deleted

## 2014-12-15 ENCOUNTER — Other Ambulatory Visit (HOSPITAL_BASED_OUTPATIENT_CLINIC_OR_DEPARTMENT_OTHER): Payer: Medicare Other

## 2014-12-15 DIAGNOSIS — D701 Agranulocytosis secondary to cancer chemotherapy: Secondary | ICD-10-CM

## 2014-12-15 DIAGNOSIS — D630 Anemia in neoplastic disease: Secondary | ICD-10-CM

## 2014-12-15 DIAGNOSIS — T451X5A Adverse effect of antineoplastic and immunosuppressive drugs, initial encounter: Principal | ICD-10-CM

## 2014-12-15 DIAGNOSIS — D46C Myelodysplastic syndrome with isolated del(5q) chromosomal abnormality: Secondary | ICD-10-CM

## 2014-12-15 DIAGNOSIS — D649 Anemia, unspecified: Secondary | ICD-10-CM

## 2014-12-15 LAB — CBC WITH DIFFERENTIAL/PLATELET
BASO%: 1.7 % (ref 0.0–2.0)
Basophils Absolute: 0 10*3/uL (ref 0.0–0.1)
EOS%: 8 % — ABNORMAL HIGH (ref 0.0–7.0)
Eosinophils Absolute: 0.1 10*3/uL (ref 0.0–0.5)
HCT: 24.4 % — ABNORMAL LOW (ref 38.4–49.9)
HEMOGLOBIN: 7.6 g/dL — AB (ref 13.0–17.1)
LYMPH%: 45.1 % (ref 14.0–49.0)
MCH: 31.9 pg (ref 27.2–33.4)
MCHC: 31.3 g/dL — ABNORMAL LOW (ref 32.0–36.0)
MCV: 101.9 fL — ABNORMAL HIGH (ref 79.3–98.0)
MONO#: 0.3 10*3/uL (ref 0.1–0.9)
MONO%: 14.3 % — ABNORMAL HIGH (ref 0.0–14.0)
NEUT%: 30.9 % — AB (ref 39.0–75.0)
NEUTROS ABS: 0.6 10*3/uL — AB (ref 1.5–6.5)
Platelets: 122 10*3/uL — ABNORMAL LOW (ref 140–400)
RBC: 2.4 10*6/uL — ABNORMAL LOW (ref 4.20–5.82)
RDW: 26.4 % — ABNORMAL HIGH (ref 11.0–14.6)
WBC: 1.8 10*3/uL — ABNORMAL LOW (ref 4.0–10.3)
lymph#: 0.8 10*3/uL — ABNORMAL LOW (ref 0.9–3.3)

## 2014-12-15 LAB — PREPARE RBC (CROSSMATCH)

## 2014-12-15 MED ORDER — TBO-FILGRASTIM 480 MCG/0.8ML ~~LOC~~ SOSY
480.0000 ug | PREFILLED_SYRINGE | Freq: Once | SUBCUTANEOUS | Status: AC
  Administered 2014-12-15: 480 ug via SUBCUTANEOUS
  Filled 2014-12-15: qty 0.8

## 2014-12-15 NOTE — Telephone Encounter (Signed)
Dr. Alvy Bimler ordered for pt get 2 units of blood transfusion.  There is no availability in our clinic today or at [facility].  Pt has dialysis tomorrow but can come back Thursday for transfusion.   Called [facility] and s/w Charlene.  She gave pt appt a 9am and pt requested a little later in the day.  She said pt can come in at 11 am.   Instructed pt to go to [facility] on Thursday 1/21 at 11 am for Transfusion.  He verbalized understanding.  Dr. Alvy Bimler is aware.

## 2014-12-17 ENCOUNTER — Other Ambulatory Visit: Payer: Medicare Other

## 2014-12-17 ENCOUNTER — Inpatient Hospital Stay (HOSPITAL_COMMUNITY): Admission: RE | Admit: 2014-12-17 | Payer: Medicare Other | Source: Ambulatory Visit

## 2014-12-17 ENCOUNTER — Telehealth: Payer: Self-pay | Admitting: *Deleted

## 2014-12-17 ENCOUNTER — Inpatient Hospital Stay (HOSPITAL_COMMUNITY)
Admission: EM | Admit: 2014-12-17 | Discharge: 2014-12-26 | DRG: 811 | Disposition: A | Discharge status: Hospice | Payer: Medicare Other | Attending: Internal Medicine | Admitting: Internal Medicine

## 2014-12-17 ENCOUNTER — Emergency Department (HOSPITAL_COMMUNITY): Payer: Medicare Other

## 2014-12-17 ENCOUNTER — Encounter (HOSPITAL_COMMUNITY): Payer: Self-pay | Admitting: Emergency Medicine

## 2014-12-17 DIAGNOSIS — I272 Other secondary pulmonary hypertension: Secondary | ICD-10-CM | POA: Diagnosis present

## 2014-12-17 DIAGNOSIS — I739 Peripheral vascular disease, unspecified: Secondary | ICD-10-CM | POA: Diagnosis present

## 2014-12-17 DIAGNOSIS — D631 Anemia in chronic kidney disease: Secondary | ICD-10-CM | POA: Diagnosis present

## 2014-12-17 DIAGNOSIS — Z79899 Other long term (current) drug therapy: Secondary | ICD-10-CM | POA: Diagnosis not present

## 2014-12-17 DIAGNOSIS — Z905 Acquired absence of kidney: Secondary | ICD-10-CM | POA: Diagnosis present

## 2014-12-17 DIAGNOSIS — H9190 Unspecified hearing loss, unspecified ear: Secondary | ICD-10-CM | POA: Diagnosis present

## 2014-12-17 DIAGNOSIS — I350 Nonrheumatic aortic (valve) stenosis: Secondary | ICD-10-CM | POA: Diagnosis present

## 2014-12-17 DIAGNOSIS — Z85528 Personal history of other malignant neoplasm of kidney: Secondary | ICD-10-CM | POA: Diagnosis not present

## 2014-12-17 DIAGNOSIS — I480 Paroxysmal atrial fibrillation: Secondary | ICD-10-CM | POA: Diagnosis not present

## 2014-12-17 DIAGNOSIS — R7989 Other specified abnormal findings of blood chemistry: Secondary | ICD-10-CM

## 2014-12-17 DIAGNOSIS — Z992 Dependence on renal dialysis: Secondary | ICD-10-CM

## 2014-12-17 DIAGNOSIS — I214 Non-ST elevation (NSTEMI) myocardial infarction: Secondary | ICD-10-CM | POA: Diagnosis not present

## 2014-12-17 DIAGNOSIS — Z515 Encounter for palliative care: Secondary | ICD-10-CM

## 2014-12-17 DIAGNOSIS — D469 Myelodysplastic syndrome, unspecified: Secondary | ICD-10-CM | POA: Diagnosis present

## 2014-12-17 DIAGNOSIS — I25119 Atherosclerotic heart disease of native coronary artery with unspecified angina pectoris: Secondary | ICD-10-CM | POA: Diagnosis present

## 2014-12-17 DIAGNOSIS — T17908A Unspecified foreign body in respiratory tract, part unspecified causing other injury, initial encounter: Secondary | ICD-10-CM

## 2014-12-17 DIAGNOSIS — R1313 Dysphagia, pharyngeal phase: Secondary | ICD-10-CM | POA: Diagnosis present

## 2014-12-17 DIAGNOSIS — I5042 Chronic combined systolic (congestive) and diastolic (congestive) heart failure: Secondary | ICD-10-CM | POA: Diagnosis present

## 2014-12-17 DIAGNOSIS — Z951 Presence of aortocoronary bypass graft: Secondary | ICD-10-CM

## 2014-12-17 DIAGNOSIS — N186 End stage renal disease: Secondary | ICD-10-CM | POA: Diagnosis present

## 2014-12-17 DIAGNOSIS — Z8614 Personal history of Methicillin resistant Staphylococcus aureus infection: Secondary | ICD-10-CM | POA: Diagnosis not present

## 2014-12-17 DIAGNOSIS — J811 Chronic pulmonary edema: Secondary | ICD-10-CM

## 2014-12-17 DIAGNOSIS — Z7951 Long term (current) use of inhaled steroids: Secondary | ICD-10-CM | POA: Diagnosis not present

## 2014-12-17 DIAGNOSIS — J690 Pneumonitis due to inhalation of food and vomit: Secondary | ICD-10-CM | POA: Diagnosis not present

## 2014-12-17 DIAGNOSIS — M109 Gout, unspecified: Secondary | ICD-10-CM | POA: Diagnosis present

## 2014-12-17 DIAGNOSIS — N2581 Secondary hyperparathyroidism of renal origin: Secondary | ICD-10-CM | POA: Diagnosis present

## 2014-12-17 DIAGNOSIS — Z8249 Family history of ischemic heart disease and other diseases of the circulatory system: Secondary | ICD-10-CM

## 2014-12-17 DIAGNOSIS — Z7982 Long term (current) use of aspirin: Secondary | ICD-10-CM

## 2014-12-17 DIAGNOSIS — K219 Gastro-esophageal reflux disease without esophagitis: Secondary | ICD-10-CM | POA: Diagnosis present

## 2014-12-17 DIAGNOSIS — Z66 Do not resuscitate: Secondary | ICD-10-CM | POA: Diagnosis not present

## 2014-12-17 DIAGNOSIS — R0602 Shortness of breath: Secondary | ICD-10-CM

## 2014-12-17 DIAGNOSIS — Z87891 Personal history of nicotine dependence: Secondary | ICD-10-CM | POA: Diagnosis not present

## 2014-12-17 DIAGNOSIS — I679 Cerebrovascular disease, unspecified: Secondary | ICD-10-CM | POA: Diagnosis present

## 2014-12-17 DIAGNOSIS — D630 Anemia in neoplastic disease: Secondary | ICD-10-CM | POA: Diagnosis present

## 2014-12-17 DIAGNOSIS — I509 Heart failure, unspecified: Secondary | ICD-10-CM

## 2014-12-17 DIAGNOSIS — E785 Hyperlipidemia, unspecified: Secondary | ICD-10-CM | POA: Diagnosis present

## 2014-12-17 DIAGNOSIS — R079 Chest pain, unspecified: Secondary | ICD-10-CM

## 2014-12-17 DIAGNOSIS — I5021 Acute systolic (congestive) heart failure: Secondary | ICD-10-CM | POA: Diagnosis present

## 2014-12-17 DIAGNOSIS — D61818 Other pancytopenia: Secondary | ICD-10-CM | POA: Insufficient documentation

## 2014-12-17 DIAGNOSIS — R778 Other specified abnormalities of plasma proteins: Secondary | ICD-10-CM | POA: Diagnosis present

## 2014-12-17 HISTORY — DX: Heart failure, unspecified: I50.9

## 2014-12-17 LAB — CBC
HCT: 22.5 % — ABNORMAL LOW (ref 39.0–52.0)
HEMATOCRIT: 25.8 % — AB (ref 39.0–52.0)
HEMOGLOBIN: 8.1 g/dL — AB (ref 13.0–17.0)
Hemoglobin: 7.3 g/dL — ABNORMAL LOW (ref 13.0–17.0)
MCH: 32.5 pg (ref 26.0–34.0)
MCH: 33.3 pg (ref 26.0–34.0)
MCHC: 31.4 g/dL (ref 30.0–36.0)
MCHC: 32.4 g/dL (ref 30.0–36.0)
MCV: 103.6 fL — ABNORMAL HIGH (ref 78.0–100.0)
MCV: 102.7 fL — ABNORMAL HIGH (ref 78.0–100.0)
PLATELETS: 123 10*3/uL — AB (ref 150–400)
PLATELETS: 110 10*3/uL — AB (ref 150–400)
RBC: 2.49 MIL/uL — AB (ref 4.22–5.81)
RBC: 2.19 MIL/uL — AB (ref 4.22–5.81)
RDW: 24.4 % — ABNORMAL HIGH (ref 11.5–15.5)
RDW: 24.3 % — ABNORMAL HIGH (ref 11.5–15.5)
WBC: 12.9 10*3/uL — AB (ref 4.0–10.5)
WBC: 10.2 10*3/uL (ref 4.0–10.5)

## 2014-12-17 LAB — BASIC METABOLIC PANEL
Anion gap: 8 (ref 5–15)
BUN: 22 mg/dL (ref 6–23)
CALCIUM: 8.4 mg/dL (ref 8.4–10.5)
CO2: 34 mmol/L — ABNORMAL HIGH (ref 19–32)
CREATININE: 4.32 mg/dL — AB (ref 0.50–1.35)
Chloride: 97 mEq/L (ref 96–112)
GFR calc Af Amer: 13 mL/min — ABNORMAL LOW (ref >90)
GFR calc non Af Amer: 11 mL/min — ABNORMAL LOW (ref >90)
GLUCOSE: 92 mg/dL (ref 70–99)
POTASSIUM: 4 mmol/L (ref 3.5–5.1)
Sodium: 139 mmol/L (ref 135–145)

## 2014-12-17 LAB — I-STAT TROPONIN, ED: TROPONIN I, POC: 0.53 ng/mL — AB (ref 0.00–0.08)

## 2014-12-17 LAB — CREATININE, SERUM
Creatinine, Ser: 4.68 mg/dL — ABNORMAL HIGH (ref 0.50–1.35)
GFR calc non Af Amer: 10 mL/min — ABNORMAL LOW (ref >90)
GFR, EST AFRICAN AMERICAN: 12 mL/min — AB (ref >90)

## 2014-12-17 LAB — TROPONIN I
Troponin I: 2.12 ng/mL (ref <0.031)
Troponin I: 2.42 ng/mL (ref <0.031)

## 2014-12-17 LAB — PREPARE RBC (CROSSMATCH)

## 2014-12-17 LAB — BRAIN NATRIURETIC PEPTIDE: B NATRIURETIC PEPTIDE 5: 2596.1 pg/mL — AB (ref 0.0–100.0)

## 2014-12-17 MED ORDER — ALLOPURINOL 100 MG PO TABS
100.0000 mg | ORAL_TABLET | Freq: Two times a day (BID) | ORAL | Status: DC
  Administered 2014-12-17 – 2014-12-25 (×14): 100 mg via ORAL
  Filled 2014-12-17 (×19): qty 1

## 2014-12-17 MED ORDER — OXYCODONE-ACETAMINOPHEN 5-325 MG PO TABS
1.0000 | ORAL_TABLET | ORAL | Status: DC | PRN
  Administered 2014-12-18 – 2014-12-26 (×4): 1 via ORAL
  Filled 2014-12-17 (×5): qty 1

## 2014-12-17 MED ORDER — POLYETHYLENE GLYCOL 3350 17 G PO PACK
17.0000 g | PACK | Freq: Every day | ORAL | Status: DC
  Administered 2014-12-17 – 2014-12-24 (×5): 17 g via ORAL
  Filled 2014-12-17 (×11): qty 1

## 2014-12-17 MED ORDER — ASPIRIN EC 81 MG PO TBEC
81.0000 mg | DELAYED_RELEASE_TABLET | Freq: Every day | ORAL | Status: DC
  Administered 2014-12-17 – 2014-12-24 (×7): 81 mg via ORAL
  Filled 2014-12-17 (×10): qty 1

## 2014-12-17 MED ORDER — SEVELAMER CARBONATE 800 MG PO TABS: 800.0000 mg | ORAL_TABLET | Freq: Two times a day (BID) | ORAL | Status: DC

## 2014-12-17 MED ORDER — SODIUM CHLORIDE 0.9 % IV SOLN: Freq: Once | INTRAVENOUS | Status: DC

## 2014-12-17 MED ORDER — DARBEPOETIN ALFA 200 MCG/0.4ML IJ SOSY
200.0000 ug | PREFILLED_SYRINGE | INTRAMUSCULAR | Status: DC
  Administered 2014-12-23: 200 ug via INTRAVENOUS
  Filled 2014-12-17: qty 0.4

## 2014-12-17 MED ORDER — GABAPENTIN 100 MG PO CAPS: 100.0000 mg | ORAL_CAPSULE | Freq: Two times a day (BID) | ORAL | Status: DC

## 2014-12-17 MED ORDER — GABAPENTIN 100 MG PO CAPS
100.0000 mg | ORAL_CAPSULE | Freq: Every morning | ORAL | Status: DC
  Administered 2014-12-18 – 2014-12-20 (×3): 100 mg via ORAL
  Filled 2014-12-17 (×4): qty 1

## 2014-12-17 MED ORDER — HEPARIN SODIUM (PORCINE) 5000 UNIT/ML IJ SOLN
5000.0000 [IU] | Freq: Three times a day (TID) | INTRAMUSCULAR | Status: DC
  Administered 2014-12-17 – 2014-12-25 (×22): 5000 [IU] via SUBCUTANEOUS
  Filled 2014-12-17 (×30): qty 1

## 2014-12-17 MED ORDER — POLYETHYLENE GLYCOL 3350 17 GM/SCOOP PO POWD
17.0000 g | Freq: Every day | ORAL | Status: DC
  Filled 2014-12-17: qty 255

## 2014-12-17 MED ORDER — SEVELAMER CARBONATE 800 MG PO TABS: 800.0000 mg | ORAL_TABLET | ORAL | Status: DC

## 2014-12-17 MED ORDER — RENA-VITE PO TABS
1.0000 | ORAL_TABLET | Freq: Every day | ORAL | Status: DC
Start: 2014-12-17 — End: 2014-12-26
  Administered 2014-12-17 – 2014-12-24 (×7): 1 via ORAL
  Filled 2014-12-17 (×11): qty 1

## 2014-12-17 MED ORDER — CALCIUM CARBONATE ANTACID 500 MG PO CHEW
1.0000 | CHEWABLE_TABLET | Freq: Every day | ORAL | Status: DC | PRN
  Filled 2014-12-17: qty 1

## 2014-12-17 MED ORDER — SEVELAMER CARBONATE 800 MG PO TABS
800.0000 mg | ORAL_TABLET | Freq: Three times a day (TID) | ORAL | Status: DC
  Administered 2014-12-17 – 2014-12-25 (×19): 800 mg via ORAL
  Filled 2014-12-17 (×28): qty 1

## 2014-12-17 MED ORDER — CINACALCET HCL 30 MG PO TABS
30.0000 mg | ORAL_TABLET | Freq: Every day | ORAL | Status: DC
  Administered 2014-12-18 – 2014-12-25 (×6): 30 mg via ORAL
  Filled 2014-12-17 (×10): qty 1

## 2014-12-17 MED ORDER — MIDODRINE HCL 5 MG PO TABS
10.0000 mg | ORAL_TABLET | ORAL | Status: DC
  Administered 2014-12-19 – 2014-12-24 (×4): 10 mg via ORAL
  Filled 2014-12-17 (×6): qty 2

## 2014-12-17 MED ORDER — SEVELAMER CARBONATE 800 MG PO TABS
800.0000 mg | ORAL_TABLET | Freq: Two times a day (BID) | ORAL | Status: DC | PRN
  Filled 2014-12-17: qty 1

## 2014-12-17 MED ORDER — MIDODRINE HCL 5 MG PO TABS
20.0000 mg | ORAL_TABLET | ORAL | Status: DC
  Administered 2014-12-18 – 2014-12-25 (×3): 20 mg via ORAL
  Filled 2014-12-17 (×4): qty 4

## 2014-12-17 MED ORDER — MIDODRINE HCL 5 MG PO TABS: 10.0000 mg | ORAL_TABLET | ORAL | Status: DC

## 2014-12-17 MED ORDER — ALBUTEROL SULFATE (2.5 MG/3ML) 0.083% IN NEBU
3.0000 mL | INHALATION_SOLUTION | Freq: Four times a day (QID) | RESPIRATORY_TRACT | Status: DC | PRN
  Administered 2014-12-19 – 2014-12-24 (×8): 3 mL via RESPIRATORY_TRACT
  Filled 2014-12-17 (×11): qty 3

## 2014-12-17 MED ORDER — MIDODRINE HCL 5 MG PO TABS
10.0000 mg | ORAL_TABLET | ORAL | Status: DC
  Filled 2014-12-17: qty 2

## 2014-12-17 MED ORDER — COLCHICINE 0.6 MG PO TABS
0.6000 mg | ORAL_TABLET | Freq: Two times a day (BID) | ORAL | Status: DC | PRN
  Filled 2014-12-17: qty 1

## 2014-12-17 MED ORDER — ASPIRIN 81 MG PO CHEW
324.0000 mg | CHEWABLE_TABLET | Freq: Once | ORAL | Status: AC
  Administered 2014-12-17: 324 mg via ORAL
  Filled 2014-12-17: qty 4

## 2014-12-17 MED ORDER — GABAPENTIN 100 MG PO CAPS
200.0000 mg | ORAL_CAPSULE | Freq: Every day | ORAL | Status: DC
  Administered 2014-12-17 – 2014-12-19 (×3): 200 mg via ORAL
  Filled 2014-12-17 (×5): qty 2

## 2014-12-17 MED ORDER — MIDODRINE HCL 5 MG PO TABS
20.0000 mg | ORAL_TABLET | ORAL | Status: DC
  Administered 2014-12-17 – 2014-12-22 (×3): 20 mg via ORAL
  Filled 2014-12-17 (×6): qty 4

## 2014-12-17 NOTE — ED Provider Notes (Signed)
CSN: [id number]     Arrival date & time 12/17/14  1118 History   First MD Initiated Contact with Patient 12/17/14 1147     Chief Complaint  Patient presents with  . Chest Pain    Patient is a 81 y.o. male presenting with chest pain. The history is provided by the patient. No language interpreter was used.  Chest Pain  Mr. Korb presents for evaluation of chest pain and shortness of breath. Last night while he was in bed he developed central dull chest pain and shortness of breath. He took 3 nitroglycerin at home with relief of his symptoms. He states that after 2 nitroglycerin his symptoms were almost completely resolved. He has been feeling in his usual state of health today but was referred to the emergency department for evaluation when it was learned that he took nitroglycerin for his chest pain last night. He does endorse dyspnea on exertion. He is scheduled for a routine blood transfusion in the anemia clinic at 11:00 today. He denies any fevers, cough, abdominal pain, bloody stools. He has a graft in his left groin.  Past Medical History  Diagnosis Date  . ESRD on hemodialysis     Hemodialysis since 2003; Occluded access in both upper extremities and the right lower extremity, current access as of Aug 2014 is L thigh AVG. Gets HD MWF at [facility].     . Duodenal ulcer     remote  . Chronic combined systolic and diastolic CHF (congestive heart failure)     a. 02/2012 Echo: EF 65%, basal infolat AK, mild conc LVH;  b. 06/2013 Echo: EF 40-45%, mild LVH, Gr2 DD, basal inf HK->AK, Mod AS, Mild MR, PASP 73;  c. 09/2014 Echo: EF 45% basal-mid inflat and inf AK, Gr 2DD, mod AS.  Marland Kitchen Arteriosclerotic cardiovascular disease (ASCVD) 1998    a. [id number]; b. 06/2003 Cath: 4/4 patent grafts, EF 55%;  c. 09/2014 Cath: LM 99d, LADnl, LCX nl, RCA 100p, LIMA->LAD nl, VG->OM2->OM3 nl, VG->RPL nl, EF 40-45%.  . Cerebrovascular disease   . Neuropathy of foot   . Degenerative joint disease   .  Gastroesophageal reflux disease   . Impaired hearing     Bilateral; hearing aids relatively ineffective  . Hypertension   . Cholelithiasis 03/2011    Diagnosed incidentally on abdominal ultrasound in 03/2011  . Hepatic steatosis   . Macrocytosis 10/20/2012  . Thrombocytopenia 10/20/2012  . Hyperlipidemia   . Secondary hyperparathyroidism   . Peripheral vascular disease   . Gout   . Moderate aortic stenosis     a. 06/2013 Echo: Mod AS, mean grad: 25, peak grad: 57;  b. 09/2014 Echo: EF 45%, Gr 2 DD, Mod AS, valve area 1.15cm^2 (VTI), 1.1cm^2 (Vmax).  . Pulmonary hypertension     a. 06/2013 Echo: PASP 59mHg.  .Marland KitchenAnemia     Prior blood transfusion  . Complication of anesthesia     daughter reports long episodes confusion after amnesia meds  . Renal cell carcinoma 2001    s/p right nephrectomy-2001; subsequent ESRD  . 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 Hypotension   . Pancytopenia   . MRSA infection     a. 08/2014->10/2014 L thigh dialysis graft.  . Infection of AV graft for dialysis     a. Recurrent L thigh infxn s/p revision 10/1, I & D 11/28, and removal 11/01/2014.   Past Surgical History  Procedure Laterality Date  . Right nephrectomy  2001  Renal cell carcinoma  . Colonoscopy  01/24/2011    prominent vascular pattern, suboptimal prep but doable  . Esophagogastroduodenoscopy  01/24/2011    mild erosive reflux esophagitis, bulbar/antral erosions, bx from antrum benign  . Av fistula repair  2008    revision of anastomosis of Right AVF  . Arteriovenous graft placement  2006    Thrombectomy and interposition jump graft revision to higher axillary vein of LUA AVG  . Coronary artery bypass graft  1998    X4 VESSELS  . Multiple cysto/ resection tumor's with bx's  LAST ONE 05-09-2011  . Multiple surg's / interventions for avgg/ fistula right upper arm  LAST REVISION 06-29-2011    (JUNE 7017 [place] VEIN/ [id number] DILATATION ANGIOPLASTY)  .  Left ptc left renal artery    . Cardiac catheterization  2004  . Cataract extraction w/ intraocular lens  implant, bilateral    . Cystoscopy w/ retrogrades  12/28/2011    Procedure: CYSTOSCOPY WITH RETROGRADE PYELOGRAM;  Surgeon: Malka So, MD;  Location: [facility];  Service: Urology;  Laterality: Left;  C-ARM   . Transurethral resection of bladder tumor  12/28/2011    Procedure: TRANSURETHRAL RESECTION OF BLADDER TUMOR (TURBT);  Surgeon: Malka So, MD;  Location: [facility];  Service: Urology;  Laterality: N/A;  . Av fistula placement  02/29/2012    Procedure: INSERTION OF ARTERIOVENOUS (AV) GORE-TEX GRAFT THIGH;  Surgeon: Rosetta Posner, MD;  Location: [place];  Service: Vascular;  Laterality: Right;  Insertion right femoral arteriovenous gortex graft  . Ligation of right braciocephalic av fistula Right 04-04-2012  . Insertion of dialysis catheter  06/05/2012    Procedure: INSERTION OF DIALYSIS CATHETER;  Surgeon: Mal Misty, MD;  Location: [place];  Service: Vascular;  Laterality: Left;  Insertion diatek catheter left IJ  . Insertion of dialysis catheter  08/22/2012    Procedure: INSERTION OF DIALYSIS CATHETER;  Surgeon: Serafina Mitchell, MD;  Location: [facility];  Service: Vascular;  Laterality: Left;  insertion of dialysis catheter left  internal jugular 27cm  . Av fistula placement  10/08/2012    Procedure: INSERTION OF ARTERIOVENOUS (AV) GORE-TEX GRAFT THIGH;  Surgeon: Angelia Mould, MD;  Location: [place];  Service: Vascular;  Laterality: Left;  . Cystoscopy with biopsy N/A 08/07/2013    Procedure: CYSTOSCOPY WITH BLADDER BIOPSY;  Surgeon: Malka So, MD;  Location: [facility];  Service: Urology;  Laterality: N/A;  . Fulguration of bladder tumor N/A 08/07/2013    Procedure: FULGURATION OF BLADDER TUMOR;  Surgeon: Malka So, MD;  Location: [facility];  Service: Urology;  Laterality: N/A;  . Revision of arteriovenous goretex graft Left 08/27/2014     Procedure: THROMBECTOMY & REVISION OF LEFT ARM  ARTERIOVENOUS GORETEX GRAFT;  Surgeon: Angelia Mould, MD;  Location: [place];  Service: Vascular;  Laterality: Left;  . Revision of arteriovenous goretex graft Left 10/01/2014    Procedure: SEGMENT OF Left THIGH ARTERIOVENOUS GORETEX GRAFT Removed;  Surgeon: Mal Misty, MD;  Location: [place];  Service: Vascular;  Laterality: Left;  . Removal of graft Left 11/01/2014    Procedure: REMOVAL OF INFECTED LEFT THIGH GRAFT;  Surgeon: Serafina Mitchell, MD;  Location: [facility];  Service: Vascular;  Laterality: Left;  removal of infected left dialysis graft.  . Left heart catheterization with coronary/graft angiogram N/A 10/27/2014    Procedure: LEFT HEART CATHETERIZATION WITH Beatrix Fetters;  Surgeon: Leonie Man, MD;  Location: [facility];  Service: Cardiovascular;  Laterality: N/A;  . Tonsillectomy     Family History  Problem Relation Age of Onset  . Colon cancer Neg Hx   . Liver disease Neg Hx   . Anesthesia problems Neg Hx   . Heart disease Father   . Other Mother     died @ 78 with respiratory issues following hip fx  . Diabetes Mother    History  Substance Use Topics  . Smoking status: Former Smoker -- 1.00 packs/day for 25 years    Types: Cigarettes    Quit date: 02/26/1984  . Smokeless tobacco: Former Systems developer     Comment: quit 1985  . Alcohol Use: 0.0 oz/week    0 Not specified per week     Comment: Rarely    Review of Systems  Cardiovascular: Positive for chest pain.  All other systems reviewed and are negative.     Allergies  Review of patient's allergies indicates no known allergies.  Home Medications   Prior to Admission medications   Medication Sig Start Date End Date Taking? Authorizing Provider  acetaminophen (TYLENOL) 500 MG tablet Take 500 mg by mouth every 6 (six) hours as needed. For pain/headache    Historical Provider, MD  albuterol (PROVENTIL HFA;VENTOLIN HFA) 108 (90 BASE) MCG/ACT inhaler Inhale  2 puffs into the lungs every 6 (six) hours as needed for wheezing or shortness of breath. 06/23/13   Lendon Colonel, NP  allopurinol (ZYLOPRIM) 100 MG tablet Take 100 mg by mouth 2 (two) times daily.  09/19/11   [name], MD  aspirin EC 81 MG tablet Take 81 mg by mouth at bedtime.     Historical Provider, MD  B Complex-C-Folic Acid (DIALYVITE TABLET) TABS Take 1 tablet by mouth at bedtime.  05/24/12   Historical Provider, MD  calcium carbonate (TUMS - DOSED IN MG ELEMENTAL CALCIUM) 500 MG chewable tablet Chew 1 tablet by mouth daily as needed for heartburn. For heartburn    Historical Provider, MD  cinacalcet (SENSIPAR) 30 MG tablet Take 30 mg by mouth daily with breakfast.     Historical Provider, MD  colchicine 0.6 MG tablet Take 0.6 mg by mouth 2 (two) times daily as needed (for gout).    Historical Provider, MD  docusate sodium (COLACE) 100 MG capsule Take 100 mg by mouth daily as needed for mild constipation.     Historical Provider, MD  econazole nitrate 1 % cream Apply 1 application topically daily as needed (Athletes foot).    Historical Provider, MD  Emollient (LUBRIDERM SERIOUSLY SENSITIVE) LOTN Apply 1 application topically daily.    Historical Provider, MD  gabapentin (NEURONTIN) 100 MG capsule Take 100-200 mg by mouth 2 (two) times daily. Takes 1 in the morning and 2 at night    Historical Provider, MD  midodrine (PROAMATINE) 10 MG tablet Take 10-20 mg by mouth See admin instructions. On Dialysis days (M,W,F), patient takes 2 tabs in the morning and 1 in the evening and on the other days patient takes 1 tablet in the morning and 2 in teh evening    Historical Provider, MD  nitroGLYCERIN (NITROSTAT) 0.4 MG SL tablet Place 1 tablet (0.4 mg total) under the tongue every 5 (five) minutes as needed for chest pain. 11/24/14   Lendon Colonel, NP  oxyCODONE-acetaminophen (PERCOCET/ROXICET) 5-325 MG per tablet Take one tablet by mouth every 6 hours as needed for pain 11/05/14    Lauree Chandler, NP  polyethylene glycol  powder (MIRALAX) powder Take 17 g by mouth at bedtime.     Historical Provider, MD  Pramoxine-Menthol (GOLD BOND MAXIMUM RELIEF EX) Apply 1 application topically daily as needed (Itching).    Historical Provider, MD  sevelamer (RENVELA) 800 MG tablet Take 800-1,600 mg by mouth See admin instructions. Take 2 tabs with each meals and 1 tab with snacks    Historical Provider, MD   There were no vitals taken for this visit. Physical Exam  Constitutional: He is oriented to person, place, and time. He appears well-developed and well-nourished.  HENT:  Head: Normocephalic and atraumatic.  Cardiovascular: Normal rate and regular rhythm.   SEM  Pulmonary/Chest: Effort normal and breath sounds normal. No respiratory distress.  Abdominal: Soft. There is no tenderness. There is no rebound and no guarding.  Musculoskeletal: He exhibits no tenderness.  Neurological: He is alert and oriented to person, place, and time.  Skin: Skin is warm and dry.  Psychiatric: He has a normal mood and affect. His behavior is normal.  Nursing note and vitals reviewed.   ED Course  Procedures (including critical care time) Labs Review Labs Reviewed  [place], ED    Imaging Review No results found.   EKG Interpretation None      MDM   Final diagnoses:  Chest pain, unspecified chest pain type  End stage renal disease on dialysis    Patient with history of cardiac disease, end-stage renal disease on hemodialysis, myelodysplastic syndrome with anemia requiring frequent transfusions here with chest pain. Patient without acute pain in the emergency department and EKG without ischemic changes. I'm concerned that patient may have chest pain related to his anemia and recommend that he gets his blood transfusion as scheduled however there is concern that he will develop some volume overload following blood  transfusion and do not feel that he is safe to send home following transfusion.   D/w Hospitalist, will see for admission and transfer [facility].   D/w Dr. Mercy Moore, he will see the patient in consult at [facility].    Quintella Reichert, MD 12/17/14 1459

## 2014-12-17 NOTE — Telephone Encounter (Signed)
Opened phone note to call pt regarding missed transfusion appt this morning.  Noticed pt in ED and notified Dr. Alvy Bimler.

## 2014-12-17 NOTE — ED Notes (Addendum)
Per pt, states he was having chest pain last night-was told by [facility] nurse to come to ED for eval-is scheduled for blood transfusion today-has monthly transfusions-no chest pain currently-took 3 nitro's which relieved pain

## 2014-12-17 NOTE — Care Management Note (Addendum)
    Page 1 of 2   12/26/2014     5:28:09 PM CARE MANAGEMENT NOTE 12/26/2014  Patient:  Andrew [name], Andrew [name]   Account Number:  [id number]  Date Initiated:  12/17/2014  Documentation initiated by:  GRAVES-BIGELOW,BRENDA  Subjective/Objective Assessment:   Pt admitted for SOB and CP- Increased troponin per MD notes question if due to demand ischemia. Hx of myelodysplastic syndrome. Pt is from home alone, however he has support of daughters that provide supervision. CM spoke with Diane.     Action/Plan:   Pt is active with [facility] RN and PT services. CM did call [facility] to make aware. Pt will need resumption orders at d/c for services. Pt has transportation to HD via [facility]. DME RW at home.   Anticipated DC Date:     Anticipated DC Plan:  [place] referral  Clinical Social Worker      DC Planning Services  CM consult      PAC Choice  HOSPICE   Choice offered to / List presented to:  C-4 Adult Children   DME arranged  3-N-1  [place] arranged  HH-1 RN  [place] agency  [facility]   Status of service:  Completed, signed off Medicare Important Message given?  YES (If response is "NO", the following Medicare IM given date fields will be blank) Date Medicare IM given:  12/21/2014 Medicare IM given by:  Elissa Hefty Date Additional Medicare IM given:  12/25/2014 Additional Medicare IM given by:  Elissa Hefty  Discharge Disposition:  [place]  Per UR Regulation:  Reviewed for med. necessity/level of care/duration of stay  If discussed at [place] of Stay Meetings, dates discussed:   12/22/2014  12/24/2014    Comments:  12/26/14 17:25 CM faxed prescriptions to [facility]; [place] verified receipt.  Family has verified receipt of DME.  [place] to arrange for transportation home via non-emergency transport.  No other CM needs were communicated. Christella Scheuermann,  BSN, Jearld Lesch [phone].  12/26/14 CM spoke with family and pt in room to offer choice of home hospice. family choose [facility]. Contacts for family are daughters: Butch Penny [phone], [place] and Enid Derry at [phone].  Referral called to [facility] and callback from [facility] who has requested I fax facesheet, H&P, Palliative consult, notes, orders to [phone].  Family have requested pt be transported home by ambulance.  Family has requested hospital bed, oxygen, 3n1 to be arranged by [facility].  Waiting for [facility] to get approval for their medical director to act as prescriber until pt's PCP can be arranged by family. [facility] states a hospitalist from [facility] cannot be that interim MD. will monitor for further needs. Mariane Masters, [place], [place].  1/28  0949 debbie dowell rn,bsn pt was seen by cir but they rec snf. sw ref placed on 1-27. will cont to follow.  1/26 1053 debbie dowell rn,bsn [facility] aware of pt's adm.

## 2014-12-17 NOTE — ED Notes (Signed)
Bed: WA12 Expected date:  Expected time:  Means of arrival:  Comments: Hold for triage 2 

## 2014-12-17 NOTE — ED Notes (Signed)
Patient transported to X-ray 

## 2014-12-17 NOTE — Progress Notes (Signed)
CRITICAL VALUE ALERT  Critical value received:  Troponin 2.12 Date of notification:  12/17/14  Time of notification:  5:59 pm  Critical value read back:Yes.    Nurse who received alert:  Alden Server   MD notified (1st page):  Dr. Coralyn Pear  Time of first page:  5:58 pm MD notified (2nd page):  Time of second page:  Responding MD:  Dr. Coralyn Pear Time MD responded: 6:15 pm

## 2014-12-17 NOTE — Consult Note (Signed)
[facility] Renal Consultation Note  Indication for Consultation:  Management of ESRD/hemodialysis; anemia, hypertension/volume and secondary hyperparathyroidism  HPI: Andrew [name] is a 81 y.o. male admitted with chest pain  ( "had CP at home  1 am took NTG X2 with relief, his visiting Home RN this am said go to ER") He has sob with Exertion/ was scheduled for 2 units PRBCS by his Hematologist ?oncologist today at [facility]. Noted HGB 7.6 on 19th ( HO Myelodysplasia c5q deletion + chemo induced pancytopenia foll Dr. Bertis Ruddy Transfusion dependant AoCD/Malignancy)He attended HD yesterday (his normal MWF time [facility]) In ER hgb 8.1 with Troponin 0.53 with EKG wnl / cxr no alveloar edema.         Past Medical History  Diagnosis Date  . ESRD on hemodialysis     Hemodialysis since 2003; Occluded access in both upper extremities and the right lower extremity, current access as of Aug 2014 is L thigh AVG. Gets HD MWF at [facility].     . Duodenal ulcer     remote  . Chronic combined systolic and diastolic CHF (congestive heart failure)     a. 02/2012 Echo: EF 65%, basal infolat AK, mild conc LVH;  b. 06/2013 Echo: EF 40-45%, mild LVH, Gr2 DD, basal inf HK->AK, Mod AS, Mild MR, PASP 73;  c. 09/2014 Echo: EF 45% basal-mid inflat and inf AK, Gr 2DD, mod AS.  Marland Kitchen Arteriosclerotic cardiovascular disease (ASCVD) 1998    a. [id number]; b. 06/2003 Cath: 4/4 patent grafts, EF 55%;  c. 09/2014 Cath: LM 99d, LADnl, LCX nl, RCA 100p, LIMA->LAD nl, VG->OM2->OM3 nl, VG->RPL nl, EF 40-45%.  . Cerebrovascular disease   . Neuropathy of foot   . Degenerative joint disease   . Gastroesophageal reflux disease   . Impaired hearing     Bilateral; hearing aids relatively ineffective  . Hypertension   . Cholelithiasis 03/2011    Diagnosed incidentally on abdominal ultrasound in 03/2011  . Hepatic steatosis   . Macrocytosis 10/20/2012  . Thrombocytopenia 10/20/2012  . Hyperlipidemia   .  Secondary hyperparathyroidism   . Peripheral vascular disease   . Gout   . Moderate aortic stenosis     a. 06/2013 Echo: Mod AS, mean grad: 25, peak grad: 57;  b. 09/2014 Echo: EF 45%, Gr 2 DD, Mod AS, valve area 1.15cm^2 (VTI), 1.1cm^2 (Vmax).  . Pulmonary hypertension     a. 06/2013 Echo: PASP .  Marland Kitchen Anemia     Prior blood transfusion  . Complication of anesthesia     daughter reports long episodes confusion after amnesia meds  . Renal cell carcinoma 2001    s/p right nephrectomy-2001; subsequent ESRD  . 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 Hypotension   . Pancytopenia   . MRSA infection     a. 08/2014->10/2014 L thigh dialysis graft.  . Infection of AV graft for dialysis     a. Recurrent L thigh infxn s/p revision 10/1, I & D 11/28, and removal 11/01/2014.  Marland Kitchen Acute decompensated heart failure     Past Surgical History  Procedure Laterality Date  . Right nephrectomy  2001    Renal cell carcinoma  . Colonoscopy  01/24/2011    prominent vascular pattern, suboptimal prep but doable  . Esophagogastroduodenoscopy  01/24/2011    mild erosive reflux esophagitis, bulbar/antral erosions, bx from antrum benign  . Av fistula repair  2008    revision of anastomosis of Right AVF  .  Arteriovenous graft placement  2006    Thrombectomy and interposition jump graft revision to higher axillary vein of LUA AVG  . Coronary artery bypass graft  1998    X4 VESSELS  . Multiple cysto/ resection tumor's with bx's  LAST ONE 05-09-2011  . Multiple surg's / interventions for avgg/ fistula right upper arm  LAST REVISION 06-29-2011    (JUNE [id number] [place] VEIN/ [id number] DILATATION ANGIOPLASTY)  . Left ptc left renal artery    . Cardiac catheterization  2004  . Cataract extraction w/ intraocular lens  implant, bilateral    . Cystoscopy w/ retrogrades  12/28/2011    Procedure: CYSTOSCOPY WITH RETROGRADE PYELOGRAM;  Surgeon: Malka So, MD;  Location: [facility];  Service: Urology;  Laterality: Left;  C-ARM   . Transurethral resection of bladder tumor  12/28/2011    Procedure: TRANSURETHRAL RESECTION OF BLADDER TUMOR (TURBT);  Surgeon: Malka So, MD;  Location: [facility];  Service: Urology;  Laterality: N/A;  . Av fistula placement  02/29/2012    Procedure: INSERTION OF ARTERIOVENOUS (AV) GORE-TEX GRAFT THIGH;  Surgeon: Rosetta Posner, MD;  Location: [place];  Service: Vascular;  Laterality: Right;  Insertion right femoral arteriovenous gortex graft  . Ligation of right braciocephalic av fistula Right 04-04-2012  . Insertion of dialysis catheter  06/05/2012    Procedure: INSERTION OF DIALYSIS CATHETER;  Surgeon: Mal Misty, MD;  Location: [place];  Service: Vascular;  Laterality: Left;  Insertion diatek catheter left IJ  . Insertion of dialysis catheter  08/22/2012    Procedure: INSERTION OF DIALYSIS CATHETER;  Surgeon: Serafina Mitchell, MD;  Location: [facility];  Service: Vascular;  Laterality: Left;  insertion of dialysis catheter left  internal jugular 27cm  . Av fistula placement  10/08/2012    Procedure: INSERTION OF ARTERIOVENOUS (AV) GORE-TEX GRAFT THIGH;  Surgeon: Angelia Mould, MD;  Location: [place];  Service: Vascular;  Laterality: Left;  . Cystoscopy with biopsy N/A 08/07/2013    Procedure: CYSTOSCOPY WITH BLADDER BIOPSY;  Surgeon: Malka So, MD;  Location: [facility];  Service: Urology;  Laterality: N/A;  . Fulguration of bladder tumor N/A 08/07/2013    Procedure: FULGURATION OF BLADDER TUMOR;  Surgeon: Malka So, MD;  Location: [facility];  Service: Urology;  Laterality: N/A;  . Revision of arteriovenous goretex graft Left 08/27/2014    Procedure: THROMBECTOMY & REVISION OF LEFT ARM  ARTERIOVENOUS GORETEX GRAFT;  Surgeon: Angelia Mould, MD;  Location: [place];  Service: Vascular;  Laterality: Left;  . Revision of arteriovenous goretex graft Left 10/01/2014    Procedure: SEGMENT OF Left THIGH ARTERIOVENOUS  GORETEX GRAFT Removed;  Surgeon: Mal Misty, MD;  Location: [place];  Service: Vascular;  Laterality: Left;  . Removal of graft Left 11/01/2014    Procedure: REMOVAL OF INFECTED LEFT THIGH GRAFT;  Surgeon: Serafina Mitchell, MD;  Location: [place] [facility];  Service: Vascular;  Laterality: Left;  removal of infected left dialysis graft.  . Left heart catheterization with coronary/graft angiogram N/A 10/27/2014    Procedure: LEFT HEART CATHETERIZATION WITH Beatrix Fetters;  Surgeon: Leonie Man, MD;  [facility];  Service: Cardiovascular;  Laterality: N/A;  . Tonsillectomy        Family History  Problem Relation Age of Onset  . Colon cancer Neg Hx   . Liver disease Neg Hx   . Anesthesia problems Neg Hx   . Heart disease  Father   . Other Mother     died @ 95 with respiratory issues following hip fx  . Diabetes Mother    Social  Lives in [place]    reports that he quit smoking about 30 years ago. His smoking use included Cigarettes. He has a 25 pack-year smoking history. He has quit using smokeless tobacco. He reports that he drinks alcohol. He reports that he does not use illicit drugs.  No Known Allergies  Prior to Admission medications   Medication Sig Start Date End Date Taking? Authorizing Provider  acetaminophen (TYLENOL) 500 MG tablet Take 500 mg by mouth every 6 (six) hours as needed. For pain/headache   Yes Historical Provider, MD  albuterol (PROVENTIL HFA;VENTOLIN HFA) 108 (90 BASE) MCG/ACT inhaler Inhale 2 puffs into the lungs every 6 (six) hours as needed for wheezing or shortness of breath. 06/23/13  Yes Jodelle Gross, NP  allopurinol (ZYLOPRIM) 100 MG tablet Take 100 mg by mouth 2 (two) times daily.  09/19/11  Yes Rhetta Mura, MD  aspirin EC 81 MG tablet Take 81 mg by mouth at bedtime.    Yes Historical Provider, MD  B Complex-C-Folic Acid (DIALYVITE TABLET) TABS Take 1 tablet by mouth daily.  05/24/12  Yes Historical Provider, MD  calcium carbonate  (TUMS - DOSED IN MG ELEMENTAL CALCIUM) 500 MG chewable tablet Chew 1 tablet by mouth daily as needed for heartburn. For heartburn   Yes Historical Provider, MD  cetaphil (CETAPHIL) lotion Apply 1 application topically daily.   Yes Historical Provider, MD  cinacalcet (SENSIPAR) 30 MG tablet Take 30 mg by mouth daily with breakfast.    Yes Historical Provider, MD  colchicine 0.6 MG tablet Take 0.6 mg by mouth 2 (two) times daily as needed (for gout).   Yes Historical Provider, MD  econazole nitrate 1 % cream Apply 1 application topically daily as needed (Athletes foot).   Yes Historical Provider, MD  Emollient (LUBRIDERM SERIOUSLY SENSITIVE) LOTN Apply 1 application topically daily.   Yes Historical Provider, MD  gabapentin (NEURONTIN) 100 MG capsule Take 100-200 mg by mouth 2 (two) times daily. Takes 1 in the morning and 2 at night   Yes Historical Provider, MD  midodrine (PROAMATINE) 10 MG tablet Take 10-20 mg by mouth See admin instructions. On Dialysis days (M,W,F), patient takes 2 tabs in the morning and 1 in the evening and on the other days patient takes 1 tablet in the morning and 2 in teh evening   Yes Historical Provider, MD  nitroGLYCERIN (NITROSTAT) 0.4 MG SL tablet Place 1 tablet (0.4 mg total) under the tongue every 5 (five) minutes as needed for chest pain. 11/24/14  Yes Jodelle Gross, NP  oxyCODONE-acetaminophen (PERCOCET/ROXICET) 5-325 MG per tablet Take one tablet by mouth every 6 hours as needed for pain 11/05/14  Yes Sharon Seller, NP  polyethylene glycol powder (MIRALAX) powder Take 17 g by mouth at bedtime.    Yes Historical Provider, MD  sevelamer (RENVELA) 800 MG tablet Take 800-1,600 mg by mouth See admin instructions. Take 2 tabs with each meals and 1 tab with snacks   Yes Historical Provider, MD    Continuous:   Results for orders placed or performed during the hospital encounter of 12/17/14 (from the past 48 hour(s))  CBC     Status: Abnormal   Collection Time:  12/17/14 12:11 PM  Result Value Ref Range   WBC 12.9 (H) 4.0 - 10.5 K/uL   RBC 2.49 (L)  4.22 - 5.81 MIL/uL   Hemoglobin 8.1 (L) 13.0 - 17.0 g/dL   HCT 25.8 (L) 39.0 - 52.0 %   MCV 103.6 (H) 78.0 - 100.0 fL   MCH 32.5 26.0 - 34.0 pg   MCHC 31.4 30.0 - 36.0 g/dL   RDW 24.4 (H) 11.5 - 15.5 %   Platelets 123 (L) 150 - 400 K/uL  Basic metabolic panel     Status: Abnormal   Collection Time: 12/17/14 12:11 PM  Result Value Ref Range   Sodium 139 135 - 145 mmol/L    Comment: Please note change in reference range.   Potassium 4.0 3.5 - 5.1 mmol/L    Comment: Please note change in reference range.   Chloride 97 96 - 112 mEq/L   CO2 34 (H) 19 - 32 mmol/L   Glucose, Bld 92 70 - 99 mg/dL   BUN 22 6 - 23 mg/dL   Creatinine, Ser 4.32 (H) 0.50 - 1.35 mg/dL   Calcium 8.4 8.4 - 10.5 mg/dL   GFR calc non Af Amer 11 (L) >90 mL/min   GFR calc Af Amer 13 (L) >90 mL/min    Comment: (NOTE) The eGFR has been calculated using the CKD EPI equation. This calculation has not been validated in all clinical situations. eGFR's persistently <90 mL/min signify possible Chronic Kidney Disease.    Anion gap 8 5 - 15  BNP (order ONLY if patient complains of dyspnea/SOB AND you have documented it for THIS visit)     Status: Abnormal   Collection Time: 12/17/14 12:14 PM  Result Value Ref Range   B Natriuretic Peptide 2596.1 (H) 0.0 - 100.0 pg/mL    Comment: Please note change in reference range.  I-stat troponin, ED (not at [facility])     Status: Abnormal   Collection Time: 12/17/14 12:24 PM  Result Value Ref Range   Troponin i, poc 0.53 (HH) 0.00 - 0.08 ng/mL   Comment NOTIFIED PHYSICIAN    Comment 3            Comment: Due to the release kinetics of cTnI, a negative result within the first hours of the onset of symptoms does not rule out myocardial infarction with certainty. If myocardial infarction is still suspected, repeat the test at appropriate intervals.    ROS: no other positives except on  hpi  Physical Exam: Filed Vitals:   12/17/14 1451  BP:   Pulse: 90  Resp:      General: alert elderly wm . Ple[place], HARD of Hearing OX3 HEENT: [place] MMM, Eyes: nonicteric Neck: no jvd Heart: RRR, 2/6 sem lsb , no rub, nogallop Lungs:rare faint rale Abdomen: bs pos, soft , NT. ND  Extremities: nopedal edema Skin: Bilat lower leg ulcers dry nontender near ankle Neuro: HOH/ No acute deficits noted Dialysis Access: pos bruit R fem avg  Dialysis Orders: Center: [facility]  on mwf  . EDW 74.5  HD Bath 3.5 Ca , 2.9 k  Time 4hr Heparin 1500. Access L fem avg      Hec  3 mcg IV/HD Aranesp 200   Units IV/HD wkly  Venofer  0  Other oplab 7.7 12/16/14   37% tfs 3.4 phos , ca 8.2  3.3 alb   Assessment/Plan 1. Chest Pain / angina /CAD/mild ^ CE (demand ischemia)  With AnemiaElba Barman admit team/  No cp currently or sob 2.  Anemia of CKD and Myelodysplasia- for transfusion  On hd if continued low in am / max  esa/note Dr. Alvy Bimler Romie Minus requesting/Prbs transfusion also        3. ESRD -  HD MWF 4. Hypertension/volume  - chronic CHF , no sob  uf 2l on hd   Below edw by hosp wt 5. Metabolic bone disease -  vitd and binder 6. Chronic sys / diastolic CHF 7. HO AOS 8. RCC sp R nephec  Ernest Haber, PA-C [place] [phone] 12/17/2014, 4:07 PM   I have seen and examined this patient and agree with plan per Ernest Haber.   81yo WM with ESRD and chronic stable angina sent to ER thia AM after nervous HHN found out he had CP last that was relieved with SLNTG.  NO CP now.  He did not want to come to hospital in first place.  Will plan HD in AM and transfuse and hopefully DC after HD tomorrow. Pike Scantlebury T,MD 12/17/2014 5:30 PM

## 2014-12-17 NOTE — H&P (Signed)
[facility] History and Physical  Andrew [name] TJQ:[id number] DOB: [birth year] DOA: 12/17/2014  Referring physician: ED PCP: [name]., MD  Specialists: Nephrology  Chief Complaint: SOB  HPI:   81 y/o ? 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 Alvy Bimler Transfusion dependant AoCD/Malignancy, prior infected L thigh AV graft 11/27-12/9/15, CABG 1998-Patent grafts 2004, Cp s/p Cardiac cath 10/27/14=EF40-45% [thought at time was HTN related Angina/demand ischemia], Ho Bladder CA s/p TURB 11/2011, h/o  L RAS s/p PTCA hx solaitary kidney, for hypernephroma 2001 H/o CVA, suspect COPD recent admission 11/27/14-11/30/14 Decompensated HF 2/2 to dietary indiscretion Patient states he was feeling fine after dialysis 1/20.  They reportedly pulled 2.6 L of fluid off.  He feels like they have been  Trying to drop his dry weight for the past 1-2 weeks. He was supposed to get transfusion of 2 units PRBC yesterday however blood was not available at that time. He subsequently went to bed at around 10 PM last night took his baby aspirin and awoke this morning with chest pain at around 1 AM he took 1 nitroglycerin without relief the second sublingual nitroglycerin helped a little and the third sublingual nitroglycerin completely alleviated the pain. He has not had any further chest pain. He states that the chest pain was what brought him to the emergency room. He reports chronic dyspnea for the past 6 months which coincides with his AV fistula infection and debility from that. He denies any diarrhea, any cough, any sputum, any chills, any blurred vision, any double vision, any unilateral weakness, any nausea vomiting He in addition denies any dysuria, any abdominal pain, any dysphagia or any other issues at present time he also has no rash  EKG wnl CXR=Pulm edema bnp 25961 Troponin 0.53 Hb 8.1, WBc 12.9, PLt 123  Review of Systems:  10 system review performed and  negative except as per dictated above   Past Medical History  Diagnosis Date  . ESRD on hemodialysis     Hemodialysis since 2003; Occluded access in both upper extremities and the right lower extremity, current access as of Aug 2014 is L thigh AVG. Gets HD MWF at [facility].     . Duodenal ulcer     remote  . Chronic combined systolic and diastolic CHF (congestive heart failure)     a. 02/2012 Echo: EF 65%, basal infolat AK, mild conc LVH;  b. 06/2013 Echo: EF 40-45%, mild LVH, Gr2 DD, basal inf HK->AK, Mod AS, Mild MR, PASP 73;  c. 09/2014 Echo: EF 45% basal-mid inflat and inf AK, Gr 2DD, mod AS.  Marland Kitchen Arteriosclerotic cardiovascular disease (ASCVD) 1998    a. [id number]; b. 06/2003 Cath: 4/4 patent grafts, EF 55%;  c. 09/2014 Cath: LM 99d, LADnl, LCX nl, RCA 100p, LIMA->LAD nl, VG->OM2->OM3 nl, VG->RPL nl, EF 40-45%.  . Cerebrovascular disease   . Neuropathy of foot   . Degenerative joint disease   . Gastroesophageal reflux disease   . Impaired hearing     Bilateral; hearing aids relatively ineffective  . Hypertension   . Cholelithiasis 03/2011    Diagnosed incidentally on abdominal ultrasound in 03/2011  . Hepatic steatosis   . Macrocytosis 10/20/2012  . Thrombocytopenia 10/20/2012  . Hyperlipidemia   . Secondary hyperparathyroidism   . Peripheral vascular disease   . Gout   . Moderate aortic stenosis     a. 06/2013 Echo: Mod AS, mean grad: 25, peak grad: 57;  b. 09/2014 Echo: EF 45%, Gr  2 DD, Mod AS, valve area 1.15cm^2 (VTI), 1.1cm^2 (Vmax).  . Pulmonary hypertension     a. 06/2013 Echo: PASP 12mmHg.  Marland Kitchen Anemia     Prior blood transfusion  . Complication of anesthesia     daughter reports long episodes confusion after amnesia meds  . Renal cell carcinoma 2001    s/p right nephrectomy-2001; subsequent ESRD  . 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 Hypotension   . Pancytopenia   . MRSA infection     a. 08/2014->10/2014 L thigh dialysis graft.  .  Infection of AV graft for dialysis     a. Recurrent L thigh infxn s/p revision 10/1, I & D 11/28, and removal 11/01/2014.   Past Surgical History  Procedure Laterality Date  . Right nephrectomy  2001    Renal cell carcinoma  . Colonoscopy  01/24/2011    prominent vascular pattern, suboptimal prep but doable  . Esophagogastroduodenoscopy  01/24/2011    mild erosive reflux esophagitis, bulbar/antral erosions, bx from antrum benign  . Av fistula repair  2008    revision of anastomosis of Right AVF  . Arteriovenous graft placement  2006    Thrombectomy and interposition jump graft revision to higher axillary vein of LUA AVG  . Coronary artery bypass graft  1998    X4 VESSELS  . Multiple cysto/ resection tumor's with bx's  LAST ONE 05-09-2011  . Multiple surg's / interventions for avgg/ fistula right upper arm  LAST REVISION 06-29-2011    (JUNE [id number] VEIN/ [id number] DILATATION ANGIOPLASTY)  . Left ptc left renal artery    . Cardiac catheterization  2004  . Cataract extraction w/ intraocular lens  implant, bilateral    . Cystoscopy w/ retrogrades  12/28/2011    Procedure: CYSTOSCOPY WITH RETROGRADE PYELOGRAM;  Surgeon: Malka So, MD;  Location: [facility];  Service: Urology;  Laterality: Left;  C-ARM   . Transurethral resection of bladder tumor  12/28/2011    Procedure: TRANSURETHRAL RESECTION OF BLADDER TUMOR (TURBT);  Surgeon: Malka So, MD;  Location: [facility];  Service: Urology;  Laterality: N/A;  . Av fistula placement  02/29/2012    Procedure: INSERTION OF ARTERIOVENOUS (AV) GORE-TEX GRAFT THIGH;  Surgeon: Rosetta Posner, MD;  Location: [facility];  Service: Vascular;  Laterality: Right;  Insertion right femoral arteriovenous gortex graft  . Ligation of right braciocephalic av fistula Right 04-04-2012  . Insertion of dialysis catheter  06/05/2012    Procedure: INSERTION OF DIALYSIS CATHETER;  Surgeon: Mal Misty, MD;  Location: [facility];   Service: Vascular;  Laterality: Left;  Insertion diatek catheter left IJ  . Insertion of dialysis catheter  08/22/2012    Procedure: INSERTION OF DIALYSIS CATHETER;  Surgeon: Serafina Mitchell, MD;  Location: [facility];  Service: Vascular;  Laterality: Left;  insertion of dialysis catheter left  internal jugular 27cm  . Av fistula placement  10/08/2012    Procedure: INSERTION OF ARTERIOVENOUS (AV) GORE-TEX GRAFT THIGH;  Surgeon: Angelia Mould, MD;  Location: [facility];  Service: Vascular;  Laterality: Left;  . Cystoscopy with biopsy N/A 08/07/2013    Procedure: CYSTOSCOPY WITH BLADDER BIOPSY;  Surgeon: Malka So, MD;  Location: [facility];  Service: Urology;  Laterality: N/A;  . Fulguration of bladder tumor N/A 08/07/2013    Procedure: FULGURATION OF BLADDER TUMOR;  Surgeon: Malka So, MD;  Location: [facility];  Service: Urology;  Laterality: N/A;  .  Revision of arteriovenous goretex graft Left 08/27/2014    Procedure: THROMBECTOMY & REVISION OF LEFT ARM  ARTERIOVENOUS GORETEX GRAFT;  Surgeon: Angelia Mould, MD;  Location: [facility];  Service: Vascular;  Laterality: Left;  . Revision of arteriovenous goretex graft Left 10/01/2014    Procedure: SEGMENT OF Left THIGH ARTERIOVENOUS GORETEX GRAFT Removed;  Surgeon: Mal Misty, MD;  Location: [facility];  Service: Vascular;  Laterality: Left;  . Removal of graft Left 11/01/2014    Procedure: REMOVAL OF INFECTED LEFT THIGH GRAFT;  Surgeon: Serafina Mitchell, MD;  Location: [facility];  Service: Vascular;  Laterality: Left;  removal of infected left dialysis graft.  . Left heart catheterization with coronary/graft angiogram N/A 10/27/2014    Procedure: LEFT HEART CATHETERIZATION WITH Beatrix Fetters;  Surgeon: Leonie Man, MD;  Location: [facility] CATH [facility];  Service: Cardiovascular;  Laterality: N/A;  . Tonsillectomy     Social History:  History   Social History Narrative   Lives in [place] by himself.  Family near by.   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 Colon cancer Neg Hx   . Liver disease Neg Hx   . Anesthesia problems Neg Hx   . Heart disease Father   . Other Mother     died @ 16 with respiratory issues following hip fx  . Diabetes Mother     Prior to Admission medications   Medication Sig Start Date End Date Taking? Authorizing Provider  acetaminophen (TYLENOL) 500 MG tablet Take 500 mg by mouth every 6 (six) hours as needed. For pain/headache   Yes Historical Provider, MD  albuterol (PROVENTIL HFA;VENTOLIN HFA) 108 (90 BASE) MCG/ACT inhaler Inhale 2 puffs into the lungs every 6 (six) hours as needed for wheezing or shortness of breath. 06/23/13  Yes Lendon Colonel, NP  allopurinol (ZYLOPRIM) 100 MG tablet Take 100 mg by mouth 2 (two) times daily.  09/19/11  Yes [name], MD  aspirin EC 81 MG tablet Take 81 mg by mouth at bedtime.    Yes Historical Provider, MD  B Complex-C-Folic Acid (DIALYVITE TABLET) TABS Take 1 tablet by mouth daily.  05/24/12  Yes Historical Provider, MD  calcium carbonate (TUMS - DOSED IN MG ELEMENTAL CALCIUM) 500 MG chewable tablet Chew 1 tablet by mouth daily as needed for heartburn. For heartburn   Yes Historical Provider, MD  cetaphil (CETAPHIL) lotion Apply 1 application topically daily.   Yes Historical Provider, MD  cinacalcet (SENSIPAR) 30 MG tablet Take 30 mg by mouth daily with breakfast.    Yes Historical Provider, MD  colchicine 0.6 MG tablet Take 0.6 mg by mouth 2 (two) times daily as needed (for gout).   Yes Historical Provider, MD  econazole nitrate 1 % cream Apply 1 application topically daily as needed (Athletes foot).   Yes Historical Provider, MD   Emollient (LUBRIDERM SERIOUSLY SENSITIVE) LOTN Apply 1 application topically daily.   Yes Historical Provider, MD  gabapentin (NEURONTIN) 100 MG capsule Take 100-200 mg by mouth 2 (two) times daily. Takes 1 in  the morning and 2 at night   Yes Historical Provider, MD  midodrine (PROAMATINE) 10 MG tablet Take 10-20 mg by mouth See admin instructions. On Dialysis days (M,W,F), patient takes 2 tabs in the morning and 1 in the evening and on the other days patient takes 1 tablet in the morning and 2 in teh evening   Yes Historical Provider, MD  nitroGLYCERIN (NITROSTAT) 0.4 MG SL tablet Place 1 tablet (0.4 mg total) under the tongue every 5 (five) minutes as needed for chest pain. 11/24/14  Yes Lendon Colonel, NP  oxyCODONE-acetaminophen (PERCOCET/ROXICET) 5-325 MG per tablet Take one tablet by mouth every 6 hours as needed for pain 11/05/14  Yes Lauree Chandler, NP  polyethylene glycol powder (MIRALAX) powder Take 17 g by mouth at bedtime.    Yes Historical Provider, MD  sevelamer (RENVELA) 800 MG tablet Take 800-1,600 mg by mouth See admin instructions. Take 2 tabs with each meals and 1 tab with snacks   Yes Historical Provider, MD   Physical Exam: Filed Vitals:   12/17/14 1327  BP: 122/50  Pulse: 89  Resp: 17  SpO2: 97%    Alert pleasant oriented very hard of hearing EOMI NCAT no pallor no leg no neck pain JVD is flat, mucosa is dry P3-I9 holosystolic murmur asked her to left lower sternal edge Mild crackles bilaterally posterior basal region Abdomen soft scaphoid nontender nondistended No lower extremity edema No rash Cranial nerves grossly intact, moving all 4 limbs equally without any deficit, smile symmetric Power 5/5 grossly bilaterally  Labs on Admission:  Basic Metabolic Panel:  Recent Labs Lab 12/17/14 1211  NA 139  K 4.0  CL 97  CO2 34*  GLUCOSE 92  BUN 22  CREATININE 4.32*  CALCIUM 8.4   Liver Function Tests: No results for input(s): AST, ALT, ALKPHOS,  BILITOT, PROT, ALBUMIN in the last 168 hours. No results for input(s): LIPASE, AMYLASE in the last 168 hours. No results for input(s): AMMONIA in the last 168 hours. CBC:  Recent Labs Lab 12/15/14 1112 12/17/14 1211  WBC 1.8* 12.9*  NEUTROABS 0.6*  --   HGB 7.6* 8.1*  HCT 24.4* 25.8*  MCV 101.9* 103.6*  PLT 122* 123*   Cardiac Enzymes: No results for input(s): CKTOTAL, CKMB, CKMBINDEX, TROPONINI in the last 168 hours.  BNP (last 3 results)  Recent Labs  10/23/14 1117  PROBNP 30772.0*   CBG: No results for input(s): GLUCAP in the last 168 hours.  Radiological Exams on Admission: Dg Chest 2 View  12/17/2014   CLINICAL DATA:  Chest pain last night. The pain was relieved with sublingual nitroglycerin.  EXAM: CHEST  2 VIEW  COMPARISON:  11/27/2014, 11/18/2014, 11/07/2014  FINDINGS: There is prior sternotomy and CABG. There is a right axillary-subclavian vascular stent. There are surgical clips in the left axilla.  There is no airspace consolidation or alveolar edema. There is mild vascular and interstitial thickening consistent with a minor degree of congestive failure. This is not as severe as on 11/27/2014. There is a tiny left pleural effusion. This is smaller than on 12/15/2014. Hilar, mediastinal and cardiac contours appear unremarkable. There is atherosclerotic calcification in the aorta.  IMPRESSION: Findings consistent with a mild degree of congestive failure with a very small left pleural effusion.   Electronically Signed   By: Andreas Newport M.D.   On: 12/17/2014 12:35    EKG: Independently reviewed. Sinus rhythm PR interval 0 point 12 QRS axis 45 criteria met for LVH  with precordial leads mild J-point elevation in V2 and V1 without any reciprocal EKG changes, noted Q wave in lead 3.  Assessment/Plan Principal Problem:   Elevated troponin-this likely represents type II demand ischemia. EKG is completely normal and he is had no current or recurrent chest pain. Have asked  the emergency room physician to administer aspirin 325 mg regardless. I think that he suffers from type II demand ischemia secondary to his anemia and a component of her shortness of breath probably as well If his troponins go above 2.0 or his EKG in the morning changes, cardiology will need to be involved in his care-last cardiac cath performed 10/2014 showed no targets and it was felt that his unstable angina was secondary to hypertension-I'm inclined to believe that this is also perfusion mismatch in terms of his anemia as well   Anemia in neoplastic disease-I discussed in person with his oncologist Dr. Alvy Bimler, as the patient was supposed to have 2 units of packed red blood cells transfused today. She will arrange to have the blood transported from [place] to [facility] long emergency room and he can be transfused subsequently.    Leukocytosis-WBC    Peripheral vascular disease with ABI's 07/2012, and L renal artery stenosis s/p PTCA   Cerebrovascular disease-   gout-continue colchicine 0.6 twice a day when necessary-this needs to be renally dosed-this may play a role in some of his pancytopenia as well. Continue allopurinol 100 twice a day   Presumed COPD-continue albuterol 2 puffs every 6 when necessary inhaler   Acute systolic heart failure-likely secondary to dietary indiscretion as well as high output heart failure component.  Patient will need dialysis after 2 units packed red blood cells.   Grade 3 dyspnea-probably secondary to deconditioning as well as multiple medical illnesses. This will need to be discussed with his cardiologist and may be a pulmonologist as he also has a hypertension based on his echocardiogram   End stage renal disease, M/W/F-emergency room physician Dr. Ayesha Rumpf has discussed with Dr. Mercy Moore who will see the patient in consult once patient arrives to [facility]   metabolic bone disease-continue Renvela 2 tabs with each meal and one tablet snacks, continue  Sensipar 30 mg every morning, continue calcium carbonate 500 when necessary   Chest pain-likely demand ischemia as above    Patient will be admitted to [facility] as he will need continued dialysis-Dr. Mercy Moore of nephrology is aware Patient will need transfusion of 2 units packed red cells and labs in the morning-Dr. Alvy Bimler available in the morning tomorrow only-please consult her if needed Please consult cardiology if needed.  Full code 75 minutes Telemetry inpatient expected length of stay 48-72 hours   Verneita Griffes, MD [facility]   If 7PM-7AM, please contact night-coverage [email or web address] Password [email or web address] 12/17/2014, 1:52 PM

## 2014-12-17 NOTE — Progress Notes (Addendum)
Follow -up note  Patient present as a transfer this evening from [facility], has h/o of CAD s/p CABG x 4 in 1998, recently undergoing cardiac cath on 10/27/2014, procedure performed by Dr Ellyn Hack where he was found to patent grafts. Following findings from Cath report:    LIMA-LAD: Widely patent graft to the mid LAD. Retrograde filling fills a proximal diagonal branch. Low reveals the LAD with mild diffuse luminal irregularities distally. It wraps the apex briefly. Minimal luminal irregularities downstream in the native LAD.  SVG-OM 2-OM 3: Widely patent, large graft that has a proximal anastomosis to a large caliber proximal OM1 branch. There is in a widely patent continuous leg to a smaller distal OM branch.  OM 2 is a large caliber vessel that reaches almost out of the inferoapex. Angiographically normal.  OM 3 is a small caliber diffusely diseased vessel but none greater than 20-30%.  SVG-RPL: Widely patent graft to the proximal right posterior AV groove branch with antegrade flow filling a posterior lateral system with 2 posterior lateral branches. Retrograde flow fills back the native RCA with a RV marginal. There is also been flow down the PDA which reaches down to the apex. Diffuse luminal irregularities throughout the RCA system.   I evaluated patient earlier denies CP or shortness of breath though it appears he had some CP overnight. I was called by RN and informed that troponin has trended up to 2.12 from 0.5.    I suspect this may be due to demand ischemia from anemia. He will by typed and crossed and plan to transfuse 2 units of PRBC's. I spoke with Cardiology and agreed with following plan.

## 2014-12-17 NOTE — ED Notes (Signed)
[facility] called for transportation 

## 2014-12-17 NOTE — ED Notes (Addendum)
cTnl 0.53 reported to Dr Ayesha Rumpf

## 2014-12-17 NOTE — Progress Notes (Signed)
Admission note:   Arrival Method: Via [facility] from [facility]. Mental Status: A&OX4. Telemetry: Placed on box #22.  Skin: Calloused bumps noted above bilateral heels, left callous noted to have a scab. Left thigh AVF. Tubes: N/A IV: LFA NSL.  Pain: Denies.  Family: No one at bedside. Living Situation: Home alone. States he has family nearby, a daughter that stays at night usually. Safety Measures: Bed alarm in place. Call bell within reach. 6E Orientation: Oriented to unit and surroundings.  Patient receives HD on MWF.   Joellen Jersey, RN.

## 2014-12-18 ENCOUNTER — Other Ambulatory Visit: Payer: Self-pay

## 2014-12-18 DIAGNOSIS — D630 Anemia in neoplastic disease: Secondary | ICD-10-CM

## 2014-12-18 DIAGNOSIS — I350 Nonrheumatic aortic (valve) stenosis: Secondary | ICD-10-CM

## 2014-12-18 DIAGNOSIS — I509 Heart failure, unspecified: Secondary | ICD-10-CM

## 2014-12-18 DIAGNOSIS — I5021 Acute systolic (congestive) heart failure: Secondary | ICD-10-CM

## 2014-12-18 DIAGNOSIS — R079 Chest pain, unspecified: Secondary | ICD-10-CM

## 2014-12-18 DIAGNOSIS — I679 Cerebrovascular disease, unspecified: Secondary | ICD-10-CM

## 2014-12-18 DIAGNOSIS — I214 Non-ST elevation (NSTEMI) myocardial infarction: Secondary | ICD-10-CM

## 2014-12-18 LAB — COMPREHENSIVE METABOLIC PANEL
ALBUMIN: 3.2 g/dL — AB (ref 3.5–5.2)
ALT: 25 U/L (ref 0–53)
AST: 41 U/L — AB (ref 0–37)
Alkaline Phosphatase: 141 U/L — ABNORMAL HIGH (ref 39–117)
Anion gap: 7 (ref 5–15)
BUN: 9 mg/dL (ref 6–23)
CO2: 32 mmol/L (ref 19–32)
Calcium: 8.3 mg/dL — ABNORMAL LOW (ref 8.4–10.5)
Chloride: 99 mmol/L (ref 96–112)
Creatinine, Ser: 2.96 mg/dL — ABNORMAL HIGH (ref 0.50–1.35)
GFR, EST AFRICAN AMERICAN: 21 mL/min — AB (ref >90)
GFR, EST NON AFRICAN AMERICAN: 18 mL/min — AB (ref >90)
Glucose, Bld: 102 mg/dL — ABNORMAL HIGH (ref 70–99)
Potassium: 3.8 mmol/L (ref 3.5–5.1)
Sodium: 138 mmol/L (ref 135–145)
TOTAL PROTEIN: 6.4 g/dL (ref 6.0–8.3)
Total Bilirubin: 0.8 mg/dL (ref 0.3–1.2)

## 2014-12-18 LAB — CBC
HCT: 20.7 % — ABNORMAL LOW (ref 39.0–52.0)
HCT: 27.7 % — ABNORMAL LOW (ref 39.0–52.0)
HEMOGLOBIN: 9.1 g/dL — AB (ref 13.0–17.0)
Hemoglobin: 6.7 g/dL — CL (ref 13.0–17.0)
MCH: 32.5 pg (ref 26.0–34.0)
MCH: 31.4 pg (ref 26.0–34.0)
MCHC: 32.4 g/dL (ref 30.0–36.0)
MCHC: 32.9 g/dL (ref 30.0–36.0)
MCV: 100.5 fL — ABNORMAL HIGH (ref 78.0–100.0)
MCV: 95.5 fL (ref 78.0–100.0)
PLATELETS: 106 10*3/uL — AB (ref 150–400)
Platelets: 119 10*3/uL — ABNORMAL LOW (ref 150–400)
RBC: 2.06 MIL/uL — AB (ref 4.22–5.81)
RBC: 2.9 MIL/uL — ABNORMAL LOW (ref 4.22–5.81)
RDW: 24.4 % — ABNORMAL HIGH (ref 11.5–15.5)
RDW: 22.8 % — ABNORMAL HIGH (ref 11.5–15.5)
WBC: 8.2 10*3/uL (ref 4.0–10.5)
WBC: 5.7 10*3/uL (ref 4.0–10.5)

## 2014-12-18 LAB — RENAL FUNCTION PANEL
ANION GAP: 9 (ref 5–15)
Albumin: 3.1 g/dL — ABNORMAL LOW (ref 3.5–5.2)
BUN: 28 mg/dL — AB (ref 6–23)
CO2: 31 mmol/L (ref 19–32)
Calcium: 8 mg/dL — ABNORMAL LOW (ref 8.4–10.5)
Chloride: 98 mEq/L (ref 96–112)
Creatinine, Ser: 5.79 mg/dL — ABNORMAL HIGH (ref 0.50–1.35)
GFR calc Af Amer: 9 mL/min — ABNORMAL LOW (ref >90)
GFR calc non Af Amer: 8 mL/min — ABNORMAL LOW (ref >90)
Glucose, Bld: 92 mg/dL (ref 70–99)
PHOSPHORUS: 2.6 mg/dL (ref 2.3–4.6)
POTASSIUM: 4.4 mmol/L (ref 3.5–5.1)
SODIUM: 138 mmol/L (ref 135–145)

## 2014-12-18 LAB — LIPID PANEL
Cholesterol: 110 mg/dL (ref 0–200)
HDL: 36 mg/dL — ABNORMAL LOW (ref >39)
LDL Cholesterol: 54 mg/dL (ref 0–99)
Total CHOL/HDL Ratio: 3.1 RATIO
Triglycerides: 100 mg/dL (ref <150)
VLDL: 20 mg/dL (ref 0–40)

## 2014-12-18 LAB — PROTIME-INR
INR: 1.05 (ref 0.00–1.49)
Prothrombin Time: 13.8 seconds (ref 11.6–15.2)

## 2014-12-18 LAB — HEPATITIS B SURFACE ANTIGEN: HEP B S AG: NEGATIVE

## 2014-12-18 LAB — TROPONIN I: Troponin I: 1.92 ng/mL (ref <0.031)

## 2014-12-18 MED ORDER — NEPRO/CARBSTEADY PO LIQD
237.0000 mL | Freq: Two times a day (BID) | ORAL | Status: DC
  Administered 2014-12-18 – 2014-12-25 (×11): 237 mL via ORAL
  Filled 2014-12-18 (×12): qty 237

## 2014-12-18 MED ORDER — SODIUM CHLORIDE 0.9 % IJ SOLN
10.0000 mL | INTRAMUSCULAR | Status: DC | PRN
Start: 2014-12-18 — End: 2014-12-18

## 2014-12-18 MED ORDER — MIDODRINE HCL 5 MG PO TABS
10.0000 mg | ORAL_TABLET | ORAL | Status: DC
Start: 2014-12-18 — End: 2014-12-26
  Administered 2014-12-18 – 2014-12-25 (×4): 10 mg via ORAL
  Filled 2014-12-18 (×4): qty 2

## 2014-12-18 MED ORDER — MIDODRINE HCL 5 MG PO TABS
ORAL_TABLET | ORAL | Status: AC
  Filled 2014-12-18: qty 4

## 2014-12-18 MED ORDER — MIDODRINE HCL 5 MG PO TABS: 10.0000 mg | ORAL_TABLET | ORAL | Status: DC

## 2014-12-18 MED ORDER — HEPARIN SOD (PORK) LOCK FLUSH 100 UNIT/ML IV SOLN: 500.0000 [IU] | Freq: Every day | INTRAVENOUS | Status: DC | PRN

## 2014-12-18 MED ORDER — METOPROLOL TARTRATE 12.5 MG HALF TABLET
12.5000 mg | ORAL_TABLET | Freq: Two times a day (BID) | ORAL | Status: DC
  Administered 2014-12-18 – 2014-12-20 (×5): 12.5 mg via ORAL
  Filled 2014-12-18 (×8): qty 1

## 2014-12-18 MED ORDER — ROSUVASTATIN CALCIUM 20 MG PO TABS
20.0000 mg | ORAL_TABLET | Freq: Every day | ORAL | Status: DC
Start: 2014-12-18 — End: 2014-12-26
  Administered 2014-12-18 – 2014-12-25 (×6): 20 mg via ORAL
  Filled 2014-12-18 (×9): qty 1

## 2014-12-18 MED ORDER — ONDANSETRON HCL 4 MG/2ML IJ SOLN: 4.0000 mg | Freq: Four times a day (QID) | INTRAMUSCULAR | Status: DC | PRN

## 2014-12-18 MED ORDER — SODIUM CHLORIDE 0.9 % IJ SOLN: 3.0000 mL | INTRAMUSCULAR | Status: DC | PRN

## 2014-12-18 MED ORDER — SODIUM CHLORIDE 0.9 % IV SOLN: 250.0000 mL | Freq: Once | INTRAVENOUS | Status: DC

## 2014-12-18 MED ORDER — HEPARIN SOD (PORK) LOCK FLUSH 100 UNIT/ML IV SOLN
250.0000 [IU] | INTRAVENOUS | Status: DC | PRN
Start: 2014-12-18 — End: 2014-12-18

## 2014-12-18 NOTE — Progress Notes (Signed)
PT Cancellation Note  Patient Details Name: [facility]Y [facility] MRN: [id number] DOB: 1926-11-19   Cancelled Treatment:    Reason Eval/Treat Not Completed: Patient not medically ready. HgB last at 6.7 and last troponin value elevated. Will hold until troponin is rechecked and trending down and Hgb is within therapeutic range.    Elie Confer [place] ,[place] 12/18/2014, 10:25 AM

## 2014-12-18 NOTE — Progress Notes (Signed)
Andrew [name] Progress Note  Assessment/Plan: 1. Chest pain/mild CAD/^ enz  Likely to due demand ischemia from low Hgb; hx AS - denies CP or SOB 2. ESRD - MWF 3. Anemia - secondary to CKD and myelodysplaisa - transfused early this am on HD 4. Secondary hyperparathyroidism - on Hectorol 5. HTN/volume - net UF 2.7 today 6. Nutrition - renal diet + suppl/vits 7. Hx RCC s/p right nephrectomy 8. Disp - given weather and frailty, favor continuing to observe - transfuse if Hgb < 8.5 given cardiac history , asked staff to recheck [place], PA-C [place] [phone] 12/18/2014,9:30 AM  LOS: 1 day   Subjective:   Tired - didn't get much sleep last night due to HD, coughing at times  Objective Filed Vitals:   12/18/14 0503 12/18/14 0518 12/18/14 0600 12/18/14 0650  BP: 138/58 135/68 140/66 117/54  Pulse: 88 83 89 88  Temp: 97.5 F (36.4 C) 97.8 F (36.6 C)  98 F (36.7 C)  TempSrc: Oral Oral  Oral  Resp: 17 16 15 16   Height:      Weight:    71.9 kg (158 lb 8.2 oz)  SpO2:    98%   Physical Exam General: chronically ill appearing breathing easily on room air; skin very warm to touch Heart: RRR 2/6 murmur Lungs: crackles left base, some rhonchi on right Abdomen: soft NT Extremities: no sig edema  Dialysis Access:  Left thigh AVGG + bruit  Dialysis Orders: reid on mwf . EDW 74.5 HD Bath 3.5 Ca , 2.9 k Time 4hr Heparin 1500. Access L fem avg  Hec 3 mcg IV/HD Aranesp 200 Units IV/HD wkly Venofer 0  Other oplab 7.7 12/16/14 37% tfs 3.4 phos , ca 8.2 3.3 alb   Additional Objective Labs: Basic Metabolic Panel:  Recent Labs Lab 12/17/14 1211 12/17/14 1630 12/18/14 0250  NA 139  --  138  K 4.0  --  4.4  CL 97  --  98  CO2 34*  --  31  GLUCOSE 92  --  92  BUN 22  --  28*  CREATININE 4.32* 4.68* 5.79*  CALCIUM 8.4  --  8.0*  PHOS  --   --  2.6   Liver Function Tests:  Recent Labs Lab 12/18/14 0250   ALBUMIN 3.1*   CBC:  Recent Labs Lab 12/15/14 1112 12/17/14 1211 12/17/14 1630 12/18/14 0250  WBC 1.8* 12.9* 10.2 8.2  NEUTROABS 0.6*  --   --   --   HGB 7.6* 8.1* 7.3* 6.7*  HCT 24.4* 25.8* 22.5* 20.7*  MCV 101.9* 103.6* 102.7* 100.5*  PLT 122* 123* 110* 119*   Blood Culture    Component Value Date/Time   SDES BLOOD RIGHT HAND 11/01/2014 2327   SPECREQUEST  11/01/2014 2327    BOTTLES DRAWN AEROBIC AND ANAEROBIC 10CC EA 5CC RED   CULT  11/01/2014 2327    NO GROWTH 5 DAYS Performed at [facility] 11/08/2014 FINAL 11/01/2014 2327    Cardiac Enzymes:  Recent Labs Lab 12/17/14 1630 12/17/14 2101  TROPONINI 2.12* 2.42*  Studies/Results: Dg Chest 2 View  12/17/2014   CLINICAL DATA:  Chest pain last night. The pain was relieved with sublingual nitroglycerin.  EXAM: CHEST  2 VIEW  COMPARISON:  11/27/2014, 11/18/2014, 11/07/2014  FINDINGS: There is prior sternotomy and CABG. There is a right axillary-subclavian vascular stent. There are surgical clips in the left axilla.  There is  no airspace consolidation or alveolar edema. There is mild vascular and interstitial thickening consistent with a minor degree of congestive failure. This is not as severe as on 11/27/2014. There is a tiny left pleural effusion. This is smaller than on 12/15/2014. Hilar, mediastinal and cardiac contours appear unremarkable. There is atherosclerotic calcification in the aorta.  IMPRESSION: Findings consistent with a mild degree of congestive failure with a very small left pleural effusion.   Electronically Signed   By: Andreas Newport M.D.   On: 12/17/2014 12:35   Medications:   . allopurinol  100 mg Oral BID  . aspirin EC  81 mg Oral QHS  . cinacalcet  30 mg Oral Q breakfast  . [START ON 12/23/2014] darbepoetin (ARANESP) injection - DIALYSIS  200 mcg Intravenous Q Wed-HD  . gabapentin  100 mg Oral q morning - 10a  . gabapentin  200 mg Oral QHS  . heparin  5,000 Units  Subcutaneous 3 times per day  . midodrine      . midodrine  10 mg Oral Once per day on Mon Wed Fri  . [START ON 12/19/2014] midodrine  10 mg Oral Once per day on Sun Tue Thu Sat  . midodrine  20 mg Oral Once per day on Mon Wed Fri  . midodrine  20 mg Oral Once per day on Sun Tue Thu Sat  . multivitamin  1 tablet Oral QHS  . polyethylene glycol  17 g Oral QHS  . sevelamer carbonate  800 mg Oral TID WC     I have seen and examined this patient and agree with plan as outlined by Daisy Blossom, PA-C.  His biggest concern is the pain in his left heal.  Denies any SSCP/SOB but admits to weakness. Merinda Victorino A,MD 12/18/2014 2:19 PM

## 2014-12-18 NOTE — Progress Notes (Signed)
[facility] PROGRESS NOTE   Andrew [name] AGT:[id number] DOB: 09/20/1926 DOA: 12/17/2014 PCP: [name]., MD  HPI/Subjective: Complaining about some cough, denies any chest pain.  Assessment/Plan: Principal Problem:   Elevated troponin Active Problems:   Anemia in neoplastic disease   Peripheral vascular disease with ABI's 07/2012, and L renal artery stenosis s/p PTCA   Cerebrovascular disease   Acute systolic heart failure   End stage renal disease, M/W/F   ESRD on dialysis   Chest pain   Acute decompensated heart failure   Pulmonary hypertension   Acute systolic CHF (congestive heart failure), NYHA class 3    Non-STEMI Patient presented with troponin of 0.5, went up to 2.42, cardiology consulted. Patient is on aspirin, started metoprolol. Started Crestor and check FLP in a.m. This is likely type II non-STEMI from significant anemia. Patient had recent cardiac cath with clean coronaries in December 2015. Cardiology for further recommendation, keep hemoglobin above 8.5.  Anemia in neoplastic disease Patient has MDS, gets transfusion frequently, presented with hemoglobin of 7.3, dropped to 6.7. Hemoglobin is 9.1 after transfusion of 2 units of packed RBCs.  ESRD Nephrology consulted, undergone dialysis already. Patient tells his days are Monday Wednesday and Friday.  Chronic combined diastolic and systolic CHF Last 2-D echocardiogram on 10/24/2014 showed LVEF of 45% in grade 2 diastolic dysfunction. Patient does not appear to be fluid overloaded now after dialysis.  Code Status: Full Code Family Communication: Plan discussed with the patient. Disposition Plan: Remains inpatient Diet: Diet renal W/1272mL fluid restriction  Consultants:  Cardiology  Procedures:  None  Antibiotics:  None   Objective: Filed Vitals:   12/18/14 1000  BP: 109/50  Pulse: 88  Temp: 98.2 F (36.8 C)  Resp: 18    Intake/Output Summary (Last 24 hours) at  12/18/14 1234 Last data filed at 12/18/14 0933  Gross per 24 hour  Intake   1150 ml  Output   2699 ml  Net  -1549 ml   Filed Weights   12/17/14 2057 12/18/14 0230 12/18/14 0650  Weight: 75.4 kg (166 lb 3.6 oz) 73.8 kg (162 lb 11.2 oz) 71.9 kg (158 lb 8.2 oz)    Exam: General: Alert and awake, oriented x3, not in any acute distress. HEENT: anicteric sclera, pupils reactive to light and accommodation, EOMI CVS: S1-S2 clear, no murmur rubs or gallops Chest: clear to auscultation bilaterally, no wheezing, rales or rhonchi Abdomen: soft nontender, nondistended, normal bowel sounds, no organomegaly Extremities: no cyanosis, clubbing or edema noted bilaterally Neuro: Cranial nerves II-XII intact, no focal neurological deficits  Data Reviewed: Basic Metabolic Panel:  Recent Labs Lab 12/17/14 1211 12/17/14 1630 12/18/14 0250 12/18/14 1124  NA 139  --  138 138  K 4.0  --  4.4 3.8  CL 97  --  98 99  CO2 34*  --  31 32  GLUCOSE 92  --  92 102*  BUN 22  --  28* 9  CREATININE 4.32* 4.68* 5.79* 2.96*  CALCIUM 8.4  --  8.0* 8.3*  PHOS  --   --  2.6  --    Liver Function Tests:  Recent Labs Lab 12/18/14 0250 12/18/14 1124  AST  --  41*  ALT  --  25  ALKPHOS  --  141*  BILITOT  --  0.8  PROT  --  6.4  ALBUMIN 3.1* 3.2*   No results for input(s): LIPASE, AMYLASE in the last 168 hours. No results for input(s): AMMONIA in the last  168 hours. CBC:  Recent Labs Lab 12/15/14 1112 12/17/14 1211 12/17/14 1630 12/18/14 0250 12/18/14 1124  WBC 1.8* 12.9* 10.2 8.2 5.7  NEUTROABS 0.6*  --   --   --   --   HGB 7.6* 8.1* 7.3* 6.7* 9.1*  HCT 24.4* 25.8* 22.5* 20.7* 27.7*  MCV 101.9* 103.6* 102.7* 100.5* 95.5  PLT 122* 123* 110* 119* 106*   Cardiac Enzymes:  Recent Labs Lab 12/17/14 1630 12/17/14 2101 12/18/14 1124  TROPONINI 2.12* 2.42* 1.92*   BNP (last 3 results)  Recent Labs  10/23/14 1117  PROBNP 30772.0*   CBG: No results for input(s): GLUCAP in the last  168 hours.  Micro No results found for this or any previous visit (from the past 240 hour(s)).   Studies: Dg Chest 2 View  12/17/2014   CLINICAL DATA:  Chest pain last night. The pain was relieved with sublingual nitroglycerin.  EXAM: CHEST  2 VIEW  COMPARISON:  11/27/2014, 11/18/2014, 11/07/2014  FINDINGS: There is prior sternotomy and CABG. There is a right axillary-subclavian vascular stent. There are surgical clips in the left axilla.  There is no airspace consolidation or alveolar edema. There is mild vascular and interstitial thickening consistent with a minor degree of congestive failure. This is not as severe as on 11/27/2014. There is a tiny left pleural effusion. This is smaller than on 12/15/2014. Hilar, mediastinal and cardiac contours appear unremarkable. There is atherosclerotic calcification in the aorta.  IMPRESSION: Findings consistent with a mild degree of congestive failure with a very small left pleural effusion.   Electronically Signed   By: Andreas Newport M.D.   On: 12/17/2014 12:35    Scheduled Meds: . allopurinol  100 mg Oral BID  . aspirin EC  81 mg Oral QHS  . cinacalcet  30 mg Oral Q breakfast  . [START ON 12/23/2014] darbepoetin (ARANESP) injection - DIALYSIS  200 mcg Intravenous Q Wed-HD  . feeding supplement (NEPRO CARB STEADY)  237 mL Oral BID BM  . gabapentin  100 mg Oral q morning - 10a  . gabapentin  200 mg Oral QHS  . heparin  5,000 Units Subcutaneous 3 times per day  . metoprolol tartrate  12.5 mg Oral BID  . midodrine      . midodrine  10 mg Oral Once per day on Mon Wed Fri  . [START ON 12/19/2014] midodrine  10 mg Oral Once per day on Sun Tue Thu Sat  . midodrine  20 mg Oral Once per day on Mon Wed Fri  . midodrine  20 mg Oral Once per day on Sun Tue Thu Sat  . multivitamin  1 tablet Oral QHS  . polyethylene glycol  17 g Oral QHS  . sevelamer carbonate  800 mg Oral TID WC   Continuous Infusions:      Time spent: 35 minutes    [facility]  A  [facility] Pager [phone] If 7PM-7AM, please contact night-coverage at [email or web address], password [facility] 12/18/2014, 12:34 PM  LOS: 1 day

## 2014-12-18 NOTE — Consult Note (Signed)
Primary Physician: Primary Cardiologist:   HPI: Patient is an 81 yo who has a history of diastolic CHF, CAD (s/p CABG in 1998.  Last cath 09/2014:  99% distal LM; LAD normal LCx normal  RCA 100%; LIMA to LAD patent; SVG to OM2/OM3 patent; SVG to PLSA normal  LVEF 40 to 45%), anemia, HTN, ESRD, moderate aortic stenosis .  He also has a history of myelodysplasia and is undergoing chemotherapy, PVOD, COPD.  Admitted earlier this month due to CHF felt to be due to dietary indiscretion Patient was supposed to get Transfusion 2 days ago  But did not    WOke up yesterday at 1 AM with CP  TOok SL NTG  X 3 with eventual relief in sympotms . Went to hosp.   ON arrival, Labs signif for Trop 0.53, BNP 25961  Peak trop was 2.42 Patient underwent dialysis and was transfused today  Net 2.7 L off. Now patinet says his breathing is OK  Denies CP  Just feels weak  Big complaint is pain on L heel from sore       Past Medical History  Diagnosis Date  . ESRD on hemodialysis     Hemodialysis since 2003; Occluded access in both upper extremities and the right lower extremity, current access as of Aug 2014 is L thigh AVG. Gets HD MWF at [facility].     . Duodenal ulcer     remote  . Chronic combined systolic and diastolic CHF (congestive heart failure)     a. 02/2012 Echo: EF 65%, basal infolat AK, mild conc LVH;  b. 06/2013 Echo: EF 40-45%, mild LVH, Gr2 DD, basal inf HK->AK, Mod AS, Mild MR, PASP 73;  c. 09/2014 Echo: EF 45% basal-mid inflat and inf AK, Gr 2DD, mod AS.  Marland Kitchen Arteriosclerotic cardiovascular disease (ASCVD) 1998    a. [id number]; b. 06/2003 Cath: 4/4 patent grafts, EF 55%;  c. 09/2014 Cath: LM 99d, LADnl, LCX nl, RCA 100p, LIMA->LAD nl, VG->OM2->OM3 nl, VG->RPL nl, EF 40-45%.  . Cerebrovascular disease   . Neuropathy of foot   . Degenerative joint disease   . Gastroesophageal reflux disease   . Impaired hearing     Bilateral; hearing aids relatively ineffective  . Hypertension   .  Cholelithiasis 03/2011    Diagnosed incidentally on abdominal ultrasound in 03/2011  . Hepatic steatosis   . Macrocytosis 10/20/2012  . Thrombocytopenia 10/20/2012  . Hyperlipidemia   . Secondary hyperparathyroidism   . Peripheral vascular disease   . Gout   . Moderate aortic stenosis     a. 06/2013 Echo: Mod AS, mean grad: 25, peak grad: 57;  b. 09/2014 Echo: EF 45%, Gr 2 DD, Mod AS, valve area 1.15cm^2 (VTI), 1.1cm^2 (Vmax).  . Pulmonary hypertension     a. 06/2013 Echo: PASP 33mmHg.  Marland Kitchen Anemia     Prior blood transfusion  . Complication of anesthesia     daughter reports long episodes confusion after amnesia meds  . Renal cell carcinoma 2001    s/p right nephrectomy-2001; subsequent ESRD  . 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 Hypotension   . Pancytopenia   . MRSA infection     a. 08/2014->10/2014 L thigh dialysis graft.  . Infection of AV graft for dialysis     a. Recurrent L thigh infxn s/p revision 10/1, I & D 11/28, and removal 11/01/2014.  Marland Kitchen Acute decompensated heart failure     Medications Prior to Admission  Medication  Sig Dispense Refill  . acetaminophen (TYLENOL) 500 MG tablet Take 500 mg by mouth every 6 (six) hours as needed. For pain/headache    . albuterol (PROVENTIL HFA;VENTOLIN HFA) 108 (90 BASE) MCG/ACT inhaler Inhale 2 puffs into the lungs every 6 (six) hours as needed for wheezing or shortness of breath. 1 Inhaler 0  . allopurinol (ZYLOPRIM) 100 MG tablet Take 100 mg by mouth 2 (two) times daily.     Marland Kitchen aspirin EC 81 MG tablet Take 81 mg by mouth at bedtime.     . B Complex-C-Folic Acid (DIALYVITE TABLET) TABS Take 1 tablet by mouth daily.     . calcium carbonate (TUMS - DOSED IN MG ELEMENTAL CALCIUM) 500 MG chewable tablet Chew 1 tablet by mouth daily as needed for heartburn. For heartburn    . cetaphil (CETAPHIL) lotion Apply 1 application topically daily.    . cinacalcet (SENSIPAR) 30 MG tablet Take 30 mg by mouth daily with breakfast.      . colchicine 0.6 MG tablet Take 0.6 mg by mouth 2 (two) times daily as needed (for gout).    Marland Kitchen econazole nitrate 1 % cream Apply 1 application topically daily as needed (Athletes foot).    . Emollient (LUBRIDERM SERIOUSLY SENSITIVE) LOTN Apply 1 application topically daily.    Marland Kitchen gabapentin (NEURONTIN) 100 MG capsule Take 100-200 mg by mouth 2 (two) times daily. Takes 1 in the morning and 2 at night    . midodrine (PROAMATINE) 10 MG tablet Take 10-20 mg by mouth See admin instructions. On Dialysis days (M,W,F), patient takes 2 tabs in the morning and 1 in the evening and on the other days patient takes 1 tablet in the morning and 2 in teh evening    . nitroGLYCERIN (NITROSTAT) 0.4 MG SL tablet Place 1 tablet (0.4 mg total) under the tongue every 5 (five) minutes as needed for chest pain. 90 tablet 3  . oxyCODONE-acetaminophen (PERCOCET/ROXICET) 5-325 MG per tablet Take one tablet by mouth every 6 hours as needed for pain 120 tablet 0  . polyethylene glycol powder (MIRALAX) powder Take 17 g by mouth at bedtime.     . sevelamer (RENVELA) 800 MG tablet Take 800-1,600 mg by mouth See admin instructions. Take 2 tabs with each meals and 1 tab with snacks       . allopurinol  100 mg Oral BID  . aspirin EC  81 mg Oral QHS  . cinacalcet  30 mg Oral Q breakfast  . [START ON 12/23/2014] darbepoetin (ARANESP) injection - DIALYSIS  200 mcg Intravenous Q Wed-HD  . feeding supplement (NEPRO CARB STEADY)  237 mL Oral BID BM  . gabapentin  100 mg Oral q morning - 10a  . gabapentin  200 mg Oral QHS  . heparin  5,000 Units Subcutaneous 3 times per day  . metoprolol tartrate  12.5 mg Oral BID  . midodrine      . midodrine  10 mg Oral Once per day on Mon Wed Fri  . [START ON 12/19/2014] midodrine  10 mg Oral Once per day on Sun Tue Thu Sat  . midodrine  20 mg Oral Once per day on Mon Wed Fri  . midodrine  20 mg Oral Once per day on Sun Tue Thu Sat  . multivitamin  1 tablet Oral QHS  . polyethylene glycol  17  g Oral QHS  . rosuvastatin  20 mg Oral q1800  . sevelamer carbonate  800 mg Oral TID WC  Infusions:    No Known Allergies  History   Social History  . Marital Status: Widowed    Spouse Name: N/A    Number of Children: 3  . Years of Education: N/A   Occupational History  . Retired    Social History Main Topics  . Smoking status: Former Smoker -- 1.00 packs/day for 25 years    Types: Cigarettes    Quit date: 02/26/1984  . Smokeless tobacco: Former Systems developer     Comment: quit 1985  . Alcohol Use: 0.0 oz/week    0 Not specified per week     Comment: Rarely  . Drug Use: No  . Sexual Activity: Not Currently   Other Topics Concern  . Not on file   Social History Narrative   Lives in [place] by himself.  Family near by.   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 Colon cancer Neg Hx   . Liver disease Neg Hx   . Anesthesia problems Neg Hx   . Heart disease Father   . Other Mother     died @ 73 with respiratory issues following hip fx  . Diabetes Mother     REVIEW OF SYSTEMS:  All systems reviewed  Negative to the above problem except as noted above.    PHYSICAL EXAM: Filed Vitals:   12/18/14 1000  BP: 109/50  Pulse: 88  Temp: 98.2 F (36.8 C)  Resp: 18     Intake/Output Summary (Last 24 hours) at 12/18/14 1253 Last data filed at 12/18/14 0933  Gross per 24 hour  Intake   1150 ml  Output   2699 ml  Net  -1549 ml    General: Frail 81 yo in NAD  No respiratory difficulty HEENT: normal Neck: supple. no JVD. Carotids 2+ bilat; no bruits.  Cor: PMI nondisplaced. Regular rate & rhythm. NoS3  No S4  Gr III/VI murmur base Lungs: clear on auscultation Abdomen: soft,  nontender, nondistended. No hepatosplenomegaly. No bruits or masses. Good bowel sounds. Extremities: no cyanosis, clubbing, rash, edema  L foot with nodule on heal with black tip  Tender   Neuro: alert & oriented x 3, cranial nerves grossly intact. moves all 4 extremities w/o difficulty. Affect pleasant.  ECG:  1/22  SR  LVH  Nonspecific ST T wave changes    Results for orders placed or performed during the hospital encounter of 12/17/14 (from the past 24 hour(s))  CBC     Status: Abnormal   Collection Time: 12/17/14  4:30 PM  Result Value Ref Range   WBC 10.2 4.0 - 10.5 K/uL   RBC 2.19 (L) 4.22 - 5.81 MIL/uL   Hemoglobin 7.3 (L) 13.0 - 17.0 g/dL   HCT 22.5 (L) 39.0 - 52.0 %   MCV 102.7 (H) 78.0 - 100.0 fL   MCH 33.3 26.0 - 34.0 pg   MCHC 32.4 30.0 - 36.0 g/dL   RDW 24.3 (H) 11.5 - 15.5 %   Platelets 110 (L) 150 - 400 K/uL  Creatinine, serum     Status: Abnormal   Collection  Time: 12/17/14  4:30 PM  Result Value Ref Range   Creatinine, Ser 4.68 (H) 0.50 - 1.35 mg/dL   GFR calc non Af Amer 10 (L) >90 mL/min   GFR calc Af Amer 12 (L) >90 mL/min  Troponin I (q 6hr x 3)     Status: Abnormal   Collection Time: 12/17/14  4:30 PM  Result Value Ref Range   Troponin I 2.12 (HH) <0.031 ng/mL  Prepare RBC     Status: None   Collection Time: 12/17/14  6:30 PM  Result Value Ref Range   Order Confirmation ORDER PROCESSED BY BLOOD BANK   Type and screen     Status: None (Preliminary result)   Collection Time: 12/17/14  8:42 PM  Result Value Ref Range   ABO/RH(D) O POS    Antibody Screen NEG    Sample Expiration 12/20/2014    Unit Number [id number]    Blood Component Type RBC, LR IRR    Unit division 00    Status of Unit ISSUED    Transfusion Status OK TO TRANSFUSE    Crossmatch Result Compatible    Unit Number [id number]    Blood Component Type RBC, LR IRR    Unit division 00    Status of Unit ISSUED    Transfusion Status OK TO TRANSFUSE    Crossmatch Result Compatible     Troponin I (q 6hr x 3)     Status: Abnormal   Collection Time: 12/17/14  9:01 PM  Result Value Ref Range   Troponin I 2.42 (HH) <0.031 ng/mL  Hepatitis B surface antigen     Status: None   Collection Time: 12/18/14  2:50 AM  Result Value Ref Range   Hepatitis B Surface Ag NEGATIVE NEGATIVE  CBC     Status: Abnormal   Collection Time: 12/18/14  2:50 AM  Result Value Ref Range   WBC 8.2 4.0 - 10.5 K/uL   RBC 2.06 (L) 4.22 - 5.81 MIL/uL   Hemoglobin 6.7 (LL) 13.0 - 17.0 g/dL   HCT 20.7 (L) 39.0 - 52.0 %   MCV 100.5 (H) 78.0 - 100.0 fL   MCH 32.5 26.0 - 34.0 pg   MCHC 32.4 30.0 - 36.0 g/dL   RDW 24.4 (H) 11.5 - 15.5 %   Platelets 119 (L) 150 - 400 K/uL  Renal function panel     Status: Abnormal   Collection Time: 12/18/14  2:50 AM  Result Value Ref Range   Sodium 138 135 - 145 mmol/L   Potassium 4.4 3.5 - 5.1 mmol/L   Chloride 98 96 - 112 mEq/L   CO2 31 19 - 32 mmol/L   Glucose, Bld 92 70 - 99 mg/dL   BUN 28 (H) 6 - 23 mg/dL   Creatinine, Ser 5.79 (H) 0.50 - 1.35 mg/dL   Calcium 8.0 (L) 8.4 - 10.5 mg/dL   Phosphorus 2.6 2.3 - 4.6 mg/dL   Albumin 3.1 (L) 3.5 - 5.2 g/dL   GFR calc non Af Amer 8 (L) >90 mL/min   GFR calc Af Amer 9 (L) >90 mL/min   Anion gap 9 5 - 15  Protime-INR     Status: None   Collection Time: 12/18/14  3:39 AM  Result Value Ref Range   Prothrombin Time 13.8 11.6 - 15.2 seconds   INR 1.05 0.00 - 1.49  Troponin I (q 6hr x 3)     Status: Abnormal   Collection Time: 12/18/14 11:24 AM  Result Value Ref  Range   Troponin I 1.92 (HH) <0.031 ng/mL  Comprehensive metabolic panel     Status: Abnormal   Collection Time: 12/18/14 11:24 AM  Result Value Ref Range   Sodium 138 135 - 145 mmol/L   Potassium 3.8 3.5 - 5.1 mmol/L   Chloride 99 96 - 112 mmol/L   CO2 32 19 - 32 mmol/L   Glucose, Bld 102 (H) 70 - 99 mg/dL   BUN 9 6 - 23 mg/dL   Creatinine, Ser 2.96 (H) 0.50 - 1.35 mg/dL   Calcium 8.3 (L) 8.4 - 10.5 mg/dL   Total Protein 6.4 6.0 - 8.3 g/dL   Albumin  3.2 (L) 3.5 - 5.2 g/dL   AST 41 (H) 0 - 37 U/L   ALT 25 0 - 53 U/L   Alkaline Phosphatase 141 (H) 39 - 117 U/L   Total Bilirubin 0.8 0.3 - 1.2 mg/dL   GFR calc non Af Amer 18 (L) >90 mL/min   GFR calc Af Amer 21 (L) >90 mL/min   Anion gap 7 5 - 15  CBC     Status: Abnormal   Collection Time: 12/18/14 11:24 AM  Result Value Ref Range   WBC 5.7 4.0 - 10.5 K/uL   RBC 2.90 (L) 4.22 - 5.81 MIL/uL   Hemoglobin 9.1 (L) 13.0 - 17.0 g/dL   HCT 27.7 (L) 39.0 - 52.0 %   MCV 95.5 78.0 - 100.0 fL   MCH 31.4 26.0 - 34.0 pg   MCHC 32.9 30.0 - 36.0 g/dL   RDW 22.8 (H) 11.5 - 15.5 %   Platelets 106 (L) 150 - 400 K/uL   Dg Chest 2 View  12/17/2014   CLINICAL DATA:  Chest pain last night. The pain was relieved with sublingual nitroglycerin.  EXAM: CHEST  2 VIEW  COMPARISON:  11/27/2014, 11/18/2014, 11/07/2014  FINDINGS: There is prior sternotomy and CABG. There is a right axillary-subclavian vascular stent. There are surgical clips in the left axilla.  There is no airspace consolidation or alveolar edema. There is mild vascular and interstitial thickening consistent with a minor degree of congestive failure. This is not as severe as on 11/27/2014. There is a tiny left pleural effusion. This is smaller than on 12/15/2014. Hilar, mediastinal and cardiac contours appear unremarkable. There is atherosclerotic calcification in the aorta.  IMPRESSION: Findings consistent with a mild degree of congestive failure with a very small left pleural effusion.   Electronically Signed   By: Andreas Newport M.D.   On: 12/17/2014 12:35   ASSESSMENT:  Patient is a 81 yo with known CAD, ESRD and moderate AS admitted with CP and severe anemia.  Has ruled in for MI with peak trop 2.4  Transfused at dialysis this AM  Denies CP but is weak (says this is usual and lasts about 12 hours after dialysis).   I have reviewed cath images from December .   Grafts are all patent  There is no discrete lesion in his native arteries  He does  have some small distal vessels.   Patient's presentation prob due to demand ischemia  I would follow Hgb closely  Keep Hgb greater than 8.5 Continue low Na diet and dialyze.  2.  Aortic stenosis.  Mean grad 19 mm Hg in November 2015  Will need to be followed  3.  Acute on chronic systolic and diastolic CHF  Lvef 78%  Volume status is improved after dialysis.  4.  ESRD  Follow in [place].  5  Heel lesion.  Wil  Defer to primary team.  This is his biggest complaint.

## 2014-12-19 LAB — TYPE AND SCREEN
ABO/RH(D): O POS
Antibody Screen: NEGATIVE
UNIT DIVISION: 0
Unit division: 0

## 2014-12-19 LAB — CBC
HCT: 25.4 % — ABNORMAL LOW (ref 39.0–52.0)
Hemoglobin: 8.3 g/dL — ABNORMAL LOW (ref 13.0–17.0)
MCH: 31.9 pg (ref 26.0–34.0)
MCHC: 32.7 g/dL (ref 30.0–36.0)
MCV: 97.7 fL (ref 78.0–100.0)
Platelets: 105 10*3/uL — ABNORMAL LOW (ref 150–400)
RBC: 2.6 MIL/uL — ABNORMAL LOW (ref 4.22–5.81)
RDW: 22.8 % — ABNORMAL HIGH (ref 11.5–15.5)
WBC: 3.7 10*3/uL — ABNORMAL LOW (ref 4.0–10.5)

## 2014-12-19 LAB — BASIC METABOLIC PANEL
Anion gap: 14 (ref 5–15)
BUN: 23 mg/dL (ref 6–23)
CO2: 25 mmol/L (ref 19–32)
Calcium: 8 mg/dL — ABNORMAL LOW (ref 8.4–10.5)
Chloride: 97 mmol/L (ref 96–112)
Creatinine, Ser: 4.7 mg/dL — ABNORMAL HIGH (ref 0.50–1.35)
GFR calc Af Amer: 12 mL/min — ABNORMAL LOW (ref >90)
GFR calc non Af Amer: 10 mL/min — ABNORMAL LOW (ref >90)
Glucose, Bld: 98 mg/dL (ref 70–99)
Potassium: 4.2 mmol/L (ref 3.5–5.1)
Sodium: 136 mmol/L (ref 135–145)

## 2014-12-19 MED ORDER — LORAZEPAM 0.5 MG PO TABS
0.5000 mg | ORAL_TABLET | Freq: Once | ORAL | Status: AC
  Administered 2014-12-19: 0.5 mg via ORAL
  Filled 2014-12-19: qty 1

## 2014-12-19 NOTE — Evaluation (Signed)
Physical Therapy Evaluation Patient Details Name: Andrew [name] MRN: [id number] DOB: 1926-09-06 Today's Date: 12/19/2014   History of Present Illness  Pt adm with elevated troponin and low Hgb. Believed to have had demand ischemia. PMH - ESRD on HD, CABG, HTN, myelodysplastic syndrome  Clinical Impression  Pt admitted with above diagnosis. Pt currently with functional limitations due to the deficits listed below (see PT Problem List).  Pt will benefit from skilled PT to increase their independence and safety with mobility to allow discharge to the venue listed below.  Pt able to return home with supportive family and HHPT.     Follow Up Recommendations Home health PT;Supervision/Assistance - 24 hour    Equipment Recommendations  None recommended by PT    Recommendations for Other Services       Precautions / Restrictions Precautions Precautions: Fall      Mobility  Bed Mobility Overal bed mobility: Needs Assistance Bed Mobility: Supine to Sit;Sit to Supine     Supine to sit: Min assist;HOB elevated Sit to supine: Min assist   General bed mobility comments: Assist to bring trunk up into sitting. Assist to bring legs back up into bed when returning to supine.  Transfers Overall transfer level: Needs assistance Equipment used: 4-wheeled walker Transfers: Sit to/from Stand Sit to Stand: Mod assist         General transfer comment: Assist to bring hips up.  Ambulation/Gait Ambulation/Gait assistance: Min assist Ambulation Distance (Feet): 35 Feet (x 2) Assistive device: 4-wheeled walker Gait Pattern/deviations: Step-through pattern;Decreased step length - right;Decreased step length - left;Shuffle;Trunk flexed   Gait velocity interpretation: Below normal speed for age/gender General Gait Details: Verbal cues to stand more erect and stay closer to walker. Pt required 1 sitting rest break on rollator.  Stairs            Wheelchair Mobility    Modified  Rankin (Stroke Patients Only)       Balance Overall balance assessment: Needs assistance Sitting-balance support: No upper extremity supported;Feet supported Sitting balance-Leahy Scale: Good     Standing balance support: Bilateral upper extremity supported Standing balance-Leahy Scale: Poor Standing balance comment: support of walker and min A for static standing due to posterior lean.                             Pertinent Vitals/Pain Pain Assessment: No/denies pain    Home Living Family/patient expects to be discharged to:: Private residence Living Arrangements: Alone;Other (Comment) (family can stay with as needed.) Available Help at Discharge: Family;Available 24 hours/day Type of Home: House Home Access: Stairs to enter Entrance Stairs-Rails: Left Entrance Stairs-Number of Steps: 1 Home Layout: One level Home Equipment: Walker - 4 wheels;Walker - 2 wheels;Wheelchair - manual;Cane - single point;Shower seat Additional Comments: Daughters have been staying with over last 6 weeks. Someone drives him to HD.    Prior Function Level of Independence: Independent with assistive device(s)         Comments: with rollator     Hand Dominance        Extremity/Trunk Assessment   Upper Extremity Assessment: Generalized weakness           Lower Extremity Assessment: Generalized weakness         Communication   Communication: HOH  Cognition Arousal/Alertness: Awake/alert Behavior During Therapy: WFL for tasks assessed/performed Overall Cognitive Status: Within Functional Limits for tasks assessed  General Comments      Exercises        Assessment/Plan    PT Assessment Patient needs continued PT services  PT Diagnosis Difficulty walking;Generalized weakness   PT Problem List Decreased strength;Decreased activity tolerance;Decreased balance;Decreased mobility;Decreased knowledge of use of DME  PT Treatment  Interventions DME instruction;Balance training;Gait training;Functional mobility training;Therapeutic activities;Therapeutic exercise;Patient/family education   PT Goals (Current goals can be found in the Care Plan section) Acute Rehab PT Goals Patient Stated Goal: return home PT Goal Formulation: With patient/family Time For Goal Achievement: 12/26/14 Potential to Achieve Goals: Good    Frequency Min 3X/week   Barriers to discharge        Co-evaluation               End of Session Equipment Utilized During Treatment: Gait belt Activity Tolerance: Patient limited by fatigue Patient left: in bed;with call bell/phone within reach;with family/visitor present Nurse Communication: Mobility status         Time: 1212-1230 PT Time Calculation (min) (ACUTE ONLY): 18 min   Charges:   PT Evaluation $Initial PT Evaluation Tier I: 1 Procedure     PT G Codes:        Fama Muenchow 20-Dec-2014, 1:43 PM  [facility] PT [phone]
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
## 2014-12-19 NOTE — Progress Notes (Signed)
[facility] Progress Note  Assessment/Plan: 1. Chest pain/mild CAD/NSTEMI Likely to due demand ischemia from low Hgb; hx AS - denies CP or SOB; Hgb had dropped to 6.7 2. ESRD - MWF 3. Anemia - secondary to CKD and myelodysplaisa - transfused 9.1 post transfusion up from 6.7 now 8.3 - getting transfused almost weekly on Tuesdays at [facility] where 4. Secondary hyperparathyroidism - on Hectorol 5. HTN/volume - net UF 2.7 1/21; below prior edw - had mild chf on cxr on adm 6. Nutrition - renal diet + suppl/vits 7. Hx RCC s/p right nephrectomy 8. Disp - given weather and frailty, favor continuing to observe - transfuse if Hgb < 8.5 given cardiac history - 9/.Heel callouses sore- followed by podiatry - just saw last week - told to float heels  Myriam Jacobson, PA-C [facility] [phone] 12/19/2014,8:56 AM  LOS: 2 days   Pt seen, examined and agree w A/P as above.  Kelly Splinter MD pager [phone]    cell [phone] 12/19/2014, 3:00 PM    Subjective:   Still feels week today.  Objective Filed Vitals:   12/18/14 1800 12/18/14 2013 12/19/14 0246 12/19/14 0610  BP: 127/52 118/44  117/47  Pulse: 74 76  79  Temp: 98.2 F (36.8 C) 98.7 F (37.1 C)  97.5 F (36.4 C)  TempSrc: Oral Oral  Oral  Resp: 18 16  16   Height:      Weight:  72.712 kg (160 lb 4.8 oz)    SpO2: 98% 95% 100% 93%   Physical Exam General: NAD Heart: RRR 2/6 murmur Lungs: occ rales Abdomen: soft NT Extremities: tr LE edema; dressings on heels Dialysis Access: left thigh AVGG + bruit  Dialysis Orders: reid on mwf . EDW 74.5 HD Bath 3.5 Ca , 2.9 k Time 4hr Heparin 1500. Access L fem avg  Hec 3 mcg IV/HD Aranesp 200 Units IV/HD wkly Venofer 0  Other oplab 7.7 12/16/14 37% tfs 3.4 phos , ca 8.2 3.3 alb    Additional Objective Labs: Basic Metabolic Panel:  Recent Labs Lab 12/18/14 0250 12/18/14 1124 12/19/14 0419  NA 138 138 136  K 4.4 3.8  4.2  CL 98 99 97  CO2 31 32 25  GLUCOSE 92 102* 98  BUN 28* 9 23  CREATININE 5.79* 2.96* 4.70*  CALCIUM 8.0* 8.3* 8.0*  PHOS 2.6  --   --    Liver Function Tests:  Recent Labs Lab 12/18/14 0250 12/18/14 1124  AST  --  41*  ALT  --  25  ALKPHOS  --  141*  BILITOT  --  0.8  PROT  --  6.4  ALBUMIN 3.1* 3.2*   CBC:  Recent Labs Lab 12/15/14 1112  12/17/14 1211 12/17/14 1630 12/18/14 0250 12/18/14 1124 12/19/14 0419  WBC 1.8*  < > 12.9* 10.2 8.2 5.7 3.7*  NEUTROABS 0.6*  --   --   --   --   --   --   HGB 7.6*  < > 8.1* 7.3* 6.7* 9.1* 8.3*  HCT 24.4*  < > 25.8* 22.5* 20.7* 27.7* 25.4*  MCV 101.9*  --  103.6* 102.7* 100.5* 95.5 97.7  PLT 122*  < > 123* 110* 119* 106* 105*  < > = values in this interval not displayed. Blood Culture    Component Value Date/Time   SDES BLOOD RIGHT HAND 11/01/2014 2327   SPECREQUEST  11/01/2014 2327    BOTTLES DRAWN AEROBIC AND ANAEROBIC 10CC EA  5CC RED   CULT  11/01/2014 2327    NO GROWTH 5 DAYS Performed at [place] 11/08/2014 FINAL 11/01/2014 2327    Cardiac Enzymes:  Recent Labs Lab 12/17/14 1630 12/17/14 2101 12/18/14 1124  TROPONINI 2.12* 2.42* 1.92*  Studies/Results: Dg Chest 2 View  12/17/2014   CLINICAL DATA:  Chest pain last night. The pain was relieved with sublingual nitroglycerin.  EXAM: CHEST  2 VIEW  COMPARISON:  11/27/2014, 11/18/2014, 11/07/2014  FINDINGS: There is prior sternotomy and CABG. There is a right axillary-subclavian vascular stent. There are surgical clips in the left axilla.  There is no airspace consolidation or alveolar edema. There is mild vascular and interstitial thickening consistent with a minor degree of congestive failure. This is not as severe as on 11/27/2014. There is a tiny left pleural effusion. This is smaller than on 12/15/2014. Hilar, mediastinal and cardiac contours appear unremarkable. There is atherosclerotic calcification in the aorta.  IMPRESSION: Findings  consistent with a mild degree of congestive failure with a very small left pleural effusion.   Electronically Signed   By: Andreas Newport M.D.   On: 12/17/2014 12:35   Medications:   . allopurinol  100 mg Oral BID  . aspirin EC  81 mg Oral QHS  . cinacalcet  30 mg Oral Q breakfast  . [START ON 12/23/2014] darbepoetin (ARANESP) injection - DIALYSIS  200 mcg Intravenous Q Wed-HD  . feeding supplement (NEPRO CARB STEADY)  237 mL Oral BID BM  . gabapentin  100 mg Oral q morning - 10a  . gabapentin  200 mg Oral QHS  . heparin  5,000 Units Subcutaneous 3 times per day  . metoprolol tartrate  12.5 mg Oral BID  . midodrine  10 mg Oral Once per day on Sun Tue Thu Sat  . midodrine  10 mg Oral Once per day on Mon Wed Fri  . midodrine  20 mg Oral Once per day on Mon Wed Fri  . midodrine  20 mg Oral Once per day on Sun Tue Thu Sat  . multivitamin  1 tablet Oral QHS  . polyethylene glycol  17 g Oral QHS  . rosuvastatin  20 mg Oral q1800  . sevelamer carbonate  800 mg Oral TID WC

## 2014-12-19 NOTE — Progress Notes (Signed)
SUBJECTIVE:  Denies chest pain.  No SOB   PHYSICAL EXAM Filed Vitals:   12/18/14 1800 12/18/14 2013 12/19/14 0246 12/19/14 0610  BP: 127/52 118/44  117/47  Pulse: 74 76  79  Temp: 98.2 F (36.8 C) 98.7 F (37.1 C)  97.5 F (36.4 C)  TempSrc: Oral Oral  Oral  Resp: 18 16  16   Height:      Weight:  160 lb 4.8 oz (72.712 kg)    SpO2: 98% 95% 100% 93%   General:  Sleepy but no distress Lungs:  Clear Heart:  Positive bowel sounds, no rebound no guarding Abdomen:   Positive bowel sounds, no rebound no guarding Extremities:  No edema  LABS: Lab Results  Component Value Date   TROPONINI 1.92* 12/18/2014   Results for orders placed or performed during the hospital encounter of 12/17/14 (from the past 24 hour(s))  Troponin I (q 6hr x 3)     Status: Abnormal   Collection Time: 12/18/14 11:24 AM  Result Value Ref Range   Troponin I 1.92 (HH) <0.031 ng/mL  Comprehensive metabolic panel     Status: Abnormal   Collection Time: 12/18/14 11:24 AM  Result Value Ref Range   Sodium 138 135 - 145 mmol/L   Potassium 3.8 3.5 - 5.1 mmol/L   Chloride 99 96 - 112 mmol/L   CO2 32 19 - 32 mmol/L   Glucose, Bld 102 (H) 70 - 99 mg/dL   BUN 9 6 - 23 mg/dL   Creatinine, Ser 2.96 (H) 0.50 - 1.35 mg/dL   Calcium 8.3 (L) 8.4 - 10.5 mg/dL   Total Protein 6.4 6.0 - 8.3 g/dL   Albumin 3.2 (L) 3.5 - 5.2 g/dL   AST 41 (H) 0 - 37 U/L   ALT 25 0 - 53 U/L   Alkaline Phosphatase 141 (H) 39 - 117 U/L   Total Bilirubin 0.8 0.3 - 1.2 mg/dL   GFR calc non Af Amer 18 (L) >90 mL/min   GFR calc Af Amer 21 (L) >90 mL/min   Anion gap 7 5 - 15  CBC     Status: Abnormal   Collection Time: 12/18/14 11:24 AM  Result Value Ref Range   WBC 5.7 4.0 - 10.5 K/uL   RBC 2.90 (L) 4.22 - 5.81 MIL/uL   Hemoglobin 9.1 (L) 13.0 - 17.0 g/dL   HCT 27.7 (L) 39.0 - 52.0 %   MCV 95.5 78.0 - 100.0 fL   MCH 31.4 26.0 - 34.0 pg   MCHC 32.9 30.0 - 36.0 g/dL   RDW 22.8 (H) 11.5 - 15.5 %   Platelets 106 (L) 150 - 400 K/uL    Lipid panel     Status: Abnormal   Collection Time: 12/18/14 12:54 PM  Result Value Ref Range   Cholesterol 110 0 - 200 mg/dL   Triglycerides 100 <150 mg/dL   HDL 36 (L) >39 mg/dL   Total CHOL/HDL Ratio 3.1 RATIO   VLDL 20 0 - 40 mg/dL   LDL Cholesterol 54 0 - 99 mg/dL  CBC     Status: Abnormal   Collection Time: 12/19/14  4:19 AM  Result Value Ref Range   WBC 3.7 (L) 4.0 - 10.5 K/uL   RBC 2.60 (L) 4.22 - 5.81 MIL/uL   Hemoglobin 8.3 (L) 13.0 - 17.0 g/dL   HCT 25.4 (L) 39.0 - 52.0 %   MCV 97.7 78.0 - 100.0 fL   MCH 31.9 26.0 - 34.0 pg  MCHC 32.7 30.0 - 36.0 g/dL   RDW 22.8 (H) 11.5 - 15.5 %   Platelets 105 (L) 150 - 400 K/uL  Basic metabolic panel     Status: Abnormal   Collection Time: 12/19/14  4:19 AM  Result Value Ref Range   Sodium 136 135 - 145 mmol/L   Potassium 4.2 3.5 - 5.1 mmol/L   Chloride 97 96 - 112 mmol/L   CO2 25 19 - 32 mmol/L   Glucose, Bld 98 70 - 99 mg/dL   BUN 23 6 - 23 mg/dL   Creatinine, Ser 4.70 (H) 0.50 - 1.35 mg/dL   Calcium 8.0 (L) 8.4 - 10.5 mg/dL   GFR calc non Af Amer 10 (L) >90 mL/min   GFR calc Af Amer 12 (L) >90 mL/min   Anion gap 14 5 - 15    Intake/Output Summary (Last 24 hours) at 12/19/14 0853 Last data filed at 12/19/14 0000  Gross per 24 hour  Intake    960 ml  Output      0 ml  Net    960 ml     ASSESSMENT AND PLAN:  DEMAND ISCHEMIA:   Troponin has trended down.  No further work up.    AS:  Mild.  Continue medical management.  ACUTE ON CHRONIC SYSTOLIC AND DIASTOLIC HF:  Volume better with dialysis.  No change in therapy.    Call with questions.   Minus Breeding 12/19/2014 8:53 AM

## 2014-12-20 ENCOUNTER — Inpatient Hospital Stay (HOSPITAL_COMMUNITY): Payer: Medicare Other

## 2014-12-20 DIAGNOSIS — I739 Peripheral vascular disease, unspecified: Secondary | ICD-10-CM

## 2014-12-20 DIAGNOSIS — I270 Primary pulmonary hypertension: Secondary | ICD-10-CM

## 2014-12-20 LAB — TYPE AND SCREEN
ABO/RH(D): O POS
Antibody Screen: NEGATIVE
Unit division: 0
Unit division: 0

## 2014-12-20 LAB — CBC
HCT: 28.4 % — ABNORMAL LOW (ref 39.0–52.0)
Hemoglobin: 9.2 g/dL — ABNORMAL LOW (ref 13.0–17.0)
MCH: 31.6 pg (ref 26.0–34.0)
MCHC: 32.4 g/dL (ref 30.0–36.0)
MCV: 97.6 fL (ref 78.0–100.0)
Platelets: 140 10*3/uL — ABNORMAL LOW (ref 150–400)
RBC: 2.91 MIL/uL — AB (ref 4.22–5.81)
RDW: 22.1 % — ABNORMAL HIGH (ref 11.5–15.5)
WBC: 3.9 10*3/uL — AB (ref 4.0–10.5)

## 2014-12-20 LAB — BASIC METABOLIC PANEL
Anion gap: 13 (ref 5–15)
BUN: 46 mg/dL — AB (ref 6–23)
CO2: 27 mmol/L (ref 19–32)
CREATININE: 7.21 mg/dL — AB (ref 0.50–1.35)
Calcium: 7.8 mg/dL — ABNORMAL LOW (ref 8.4–10.5)
Chloride: 94 mmol/L — ABNORMAL LOW (ref 96–112)
GFR calc Af Amer: 7 mL/min — ABNORMAL LOW (ref >90)
GFR calc non Af Amer: 6 mL/min — ABNORMAL LOW (ref >90)
Glucose, Bld: 86 mg/dL (ref 70–99)
Potassium: 4.9 mmol/L (ref 3.5–5.1)
SODIUM: 134 mmol/L — AB (ref 135–145)

## 2014-12-20 MED ORDER — PENTAFLUOROPROP-TETRAFLUOROETH EX AERO: 1.0000 "application " | INHALATION_SPRAY | CUTANEOUS | Status: DC | PRN

## 2014-12-20 MED ORDER — LIDOCAINE HCL (PF) 1 % IJ SOLN: 5.0000 mL | INTRAMUSCULAR | Status: DC | PRN

## 2014-12-20 MED ORDER — NEPRO/CARBSTEADY PO LIQD: 237.0000 mL | ORAL | Status: DC | PRN

## 2014-12-20 MED ORDER — HEPARIN SODIUM (PORCINE) 1000 UNIT/ML DIALYSIS: 1000.0000 [IU] | INTRAMUSCULAR | Status: DC | PRN

## 2014-12-20 MED ORDER — LIDOCAINE-PRILOCAINE 2.5-2.5 % EX CREA: 1.0000 "application " | TOPICAL_CREAM | CUTANEOUS | Status: DC | PRN

## 2014-12-20 MED ORDER — VANCOMYCIN HCL 10 G IV SOLR
1500.0000 mg | Freq: Once | INTRAVENOUS | Status: AC
  Administered 2014-12-20: 1500 mg via INTRAVENOUS
  Filled 2014-12-20: qty 1500

## 2014-12-20 MED ORDER — ALBUMIN HUMAN 25 % IV SOLN
25.0000 g | Freq: Once | INTRAVENOUS | Status: AC
  Administered 2014-12-20: 25 g via INTRAVENOUS
  Filled 2014-12-20: qty 100

## 2014-12-20 MED ORDER — SODIUM CHLORIDE 0.9 % IV SOLN: 100.0000 mL | INTRAVENOUS | Status: DC | PRN

## 2014-12-20 MED ORDER — ALBUMIN HUMAN 25 % IV SOLN
INTRAVENOUS | Status: AC
  Filled 2014-12-20: qty 150

## 2014-12-20 MED ORDER — METOPROLOL TARTRATE 25 MG PO TABS
12.5000 mg | ORAL_TABLET | Freq: Two times a day (BID) | ORAL | Status: DC
Start: 2014-12-20 — End: 2014-12-20

## 2014-12-20 MED ORDER — ALTEPLASE 2 MG IJ SOLR: 2.0000 mg | Freq: Once | INTRAMUSCULAR | Status: DC | PRN

## 2014-12-20 MED ORDER — ALTEPLASE 2 MG IJ SOLR
2.0000 mg | Freq: Once | INTRAMUSCULAR | Status: AC | PRN
  Filled 2014-12-20: qty 2

## 2014-12-20 MED ORDER — PIPERACILLIN-TAZOBACTAM IN DEX 2-0.25 GM/50ML IV SOLN
2.2500 g | Freq: Three times a day (TID) | INTRAVENOUS | Status: DC
  Administered 2014-12-20 – 2014-12-25 (×14): 2.25 g via INTRAVENOUS
  Filled 2014-12-20 (×19): qty 50

## 2014-12-20 MED ORDER — METOPROLOL TARTRATE 25 MG PO TABS: 12.5000 mg | ORAL_TABLET | Freq: Two times a day (BID) | ORAL | Status: AC

## 2014-12-20 MED ORDER — AZITHROMYCIN 250 MG PO TABS: ORAL_TABLET | ORAL | Status: AC

## 2014-12-20 MED ORDER — HEPARIN SODIUM (PORCINE) 1000 UNIT/ML DIALYSIS: 20.0000 [IU]/kg | INTRAMUSCULAR | Status: DC | PRN

## 2014-12-20 MED ORDER — VANCOMYCIN HCL IN DEXTROSE 750-5 MG/150ML-% IV SOLN
750.0000 mg | INTRAVENOUS | Status: DC
  Administered 2014-12-21: 750 mg via INTRAVENOUS
  Filled 2014-12-20 (×4): qty 150

## 2014-12-20 NOTE — Discharge Summary (Signed)
Physician Discharge Summary  Andrew [name]:[id number] DOB: 01-10-1926 DOA: 12/17/2014  PCP: [name]., MD  Admit date: 12/17/2014 Discharge date: 12/20/2014  Time spent: 40 minutes  Recommendations for Outpatient Follow-up:  1. Follow-up with primary care physician within one week.  Discharge Diagnoses:  Principal Problem:   Elevated troponin Active Problems:   Anemia in neoplastic disease   Peripheral vascular disease with ABI's 07/2012, and L renal artery stenosis s/p PTCA   Cerebrovascular disease   Acute systolic heart failure   End stage renal disease, M/W/F   ESRD on dialysis   Chest pain   Acute decompensated heart failure   Pulmonary hypertension   Acute systolic CHF (congestive heart failure), NYHA class 3   Discharge Condition: Stable  Diet recommendation: Heart healthy diet with 1200 mL fluid restriction  Filed Weights   12/18/14 0230 12/18/14 0650 12/18/14 2013  Weight: 73.8 kg (162 lb 11.2 oz) 71.9 kg (158 lb 8.2 oz) 72.712 kg (160 lb 4.8 oz)    History of present illness:  81 y/o ? 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 Alvy Bimler Transfusion dependant AoCD/Malignancy, prior infected L thigh AV graft 11/27-12/9/15, CABG 1998-Patent grafts 2004, Cp s/p Cardiac cath 10/27/14=EF40-45% [thought at time was HTN related Angina/demand ischemia], Ho Bladder CA s/p TURB 11/2011, h/o L RAS s/p PTCA hx solaitary kidney, for hypernephroma 2001 H/o CVA, suspect COPD recent admission 11/27/14-11/30/14 Decompensated HF 2/2 to dietary indiscretion Patient states he was feeling fine after dialysis 1/20. They reportedly pulled 2.6 L of fluid off. He feels like they have been Trying to drop his dry weight for the past 1-2 weeks. He was supposed to get transfusion of 2 units PRBC yesterday however blood was not available at that time. He subsequently went to bed at around 10 PM last night took his baby aspirin and awoke this morning with  chest pain at around 1 AM he took 1 nitroglycerin without relief the second sublingual nitroglycerin helped a little and the third sublingual nitroglycerin completely alleviated the pain. He has not had any further chest pain. He states that the chest pain was what brought him to the emergency room. He reports chronic dyspnea for the past 6 months which coincides with his AV fistula infection and debility from that. He denies any diarrhea, any cough, any sputum, any chills, any blurred vision, any double vision, any unilateral weakness, any nausea vomiting He in addition denies any dysuria, any abdominal pain, any dysphagia or any other issues at present time he also has no rash  EKG wnl CXR=Pulm edema bnp 25961 Troponin 0.53 Hb 8.1, WBc 12.9, PLt 123  Hospital Course:    Non-STEMI Patient presented with troponin of 0.5, went up to 2.42, cardiology consulted. Patient is on aspirin, started metoprolol. Started Crestor and check FLP in a.m. This is likely type II non-STEMI from significant anemia. Patient had recent cardiac cath with clean coronaries in December 2015. Cardiology for further recommendation, keep hemoglobin above 8.0. The troponin trending down, per cardiology no further workup. On discharge added metoprolol 12.5 mg by mouth twice a day. Continue low-dose aspirin.  Anemia in neoplastic disease Patient has MDS, gets transfusion frequently, presented with hemoglobin of 7.3, dropped to 6.7. Hemoglobin is 9.1 after transfusion of 2 units of packed RBCs. Patient continued to follow-up with the regional cancer Center at [facility], per daughter getting transfusion almost every 2 weeks now.  ESRD Nephrology consulted, undergone dialysis already. Patient tells his days  are Monday Wednesday and Friday.  Chronic combined diastolic and systolic CHF Last 2-D echocardiogram on 10/24/2014 showed LVEF of 45% in grade 2 diastolic dysfunction. Patient does not appear to be fluid  overloaded now after dialysis.  Low-grade fever Had low-grade fever of 99.4 prior to discharge. Patient was complaining about some cough and mild sputum production. His daughter requested antibiotics, Z-Pak prescribed. Instructed to take cough medicine at home.   Procedures:  Routine dialysis  Consultations:  Nephrology  Cardiology  Discharge Exam: Filed Vitals:   12/20/14 0511  BP: 109/45  Pulse: 85  Temp: 99.4 F (37.4 C)  Resp: 20   General: Alert and awake, oriented x3, not in any acute distress. Hard of hearing HEENT: anicteric sclera, pupils reactive to light and accommodation, EOMI CVS: S1-S2 clear, no murmur rubs or gallops Chest: clear to auscultation bilaterally, no wheezing, rales or rhonchi Abdomen: soft nontender, nondistended, normal bowel sounds, no organomegaly Extremities: no cyanosis, clubbing or edema noted bilaterally Neuro: Cranial nerves II-XII intact, no focal neurological deficits   Discharge Instructions   Discharge Instructions    Diet - low sodium heart healthy    Complete by:  As directed      Increase activity slowly    Complete by:  As directed           Current Discharge Medication List    START taking these medications   Details  azithromycin (ZITHROMAX Z-PAK) 250 MG tablet Take 2 tablets in the first day then 1 tablet by mouth daily until gone. Qty: 6 each, Refills: 0    metoprolol tartrate (LOPRESSOR) 25 MG tablet Take 0.5 tablets (12.5 mg total) by mouth 2 (two) times daily. Qty: 60 tablet, Refills: 0      CONTINUE these medications which have NOT CHANGED   Details  acetaminophen (TYLENOL) 500 MG tablet Take 500 mg by mouth every 6 (six) hours as needed. For pain/headache    albuterol (PROVENTIL HFA;VENTOLIN HFA) 108 (90 BASE) MCG/ACT inhaler Inhale 2 puffs into the lungs every 6 (six) hours as needed for wheezing or shortness of breath. Qty: 1 Inhaler, Refills: 0    allopurinol (ZYLOPRIM) 100 MG tablet Take 100 mg by  mouth 2 (two) times daily.     aspirin EC 81 MG tablet Take 81 mg by mouth at bedtime.     B Complex-C-Folic Acid (DIALYVITE TABLET) TABS Take 1 tablet by mouth daily.     calcium carbonate (TUMS - DOSED IN MG ELEMENTAL CALCIUM) 500 MG chewable tablet Chew 1 tablet by mouth daily as needed for heartburn. For heartburn    !! cetaphil (CETAPHIL) lotion Apply 1 application topically daily.    cinacalcet (SENSIPAR) 30 MG tablet Take 30 mg by mouth daily with breakfast.     colchicine 0.6 MG tablet Take 0.6 mg by mouth 2 (two) times daily as needed (for gout).    econazole nitrate 1 % cream Apply 1 application topically daily as needed (Athletes foot).    !! Emollient (LUBRIDERM SERIOUSLY SENSITIVE) LOTN Apply 1 application topically daily.    gabapentin (NEURONTIN) 100 MG capsule Take 100-200 mg by mouth 2 (two) times daily. Takes 1 in the morning and 2 at night    midodrine (PROAMATINE) 10 MG tablet Take 10-20 mg by mouth See admin instructions. On Dialysis days (M,W,F), patient takes 2 tabs in the morning and 1 in the evening and on the other days patient takes 1 tablet in the morning and 2 in teh evening  nitroGLYCERIN (NITROSTAT) 0.4 MG SL tablet Place 1 tablet (0.4 mg total) under the tongue every 5 (five) minutes as needed for chest pain. Qty: 90 tablet, Refills: 3    oxyCODONE-acetaminophen (PERCOCET/ROXICET) 5-325 MG per tablet Take one tablet by mouth every 6 hours as needed for pain Qty: 120 tablet, Refills: 0    polyethylene glycol powder (MIRALAX) powder Take 17 g by mouth at bedtime.     sevelamer (RENVELA) 800 MG tablet Take 800-1,600 mg by mouth See admin instructions. Take 2 tabs with each meals and 1 tab with snacks     !! - Potential duplicate medications found. Please discuss with provider.     No Known Allergies Follow-up Information    Follow up with [name]., MD In 1 week.   Specialty:  Internal Medicine   Contact information:   [address] [place] [address] [phone]        The results of significant diagnostics from this hospitalization (including imaging, microbiology, ancillary and laboratory) are listed below for reference.    Significant Diagnostic Studies: Dg Chest 2 View  12/17/2014   CLINICAL DATA:  Chest pain last night. The pain was relieved with sublingual nitroglycerin.  EXAM: CHEST  2 VIEW  COMPARISON:  11/27/2014, 11/18/2014, 11/07/2014  FINDINGS: There is prior sternotomy and CABG. There is a right axillary-subclavian vascular stent. There are surgical clips in the left axilla.  There is no airspace consolidation or alveolar edema. There is mild vascular and interstitial thickening consistent with a minor degree of congestive failure. This is not as severe as on 11/27/2014. There is a tiny left pleural effusion. This is smaller than on 12/15/2014. Hilar, mediastinal and cardiac contours appear unremarkable. There is atherosclerotic calcification in the aorta.  IMPRESSION: Findings consistent with a mild degree of congestive failure with a very small left pleural effusion.   Electronically Signed   By: Andreas Newport M.D.   On: 12/17/2014 12:35   Dg Chest 2 View  11/27/2014   CLINICAL DATA:  Shortness of breath and chest pain  EXAM: CHEST  2 VIEW  COMPARISON:  11/18/2014  FINDINGS: Small bilateral pleural effusions. No overt pulmonary edema. Mild cardiomegaly that is stable from prior. The aorta and hila are negative. The patient is status post CABG. Surgical changes to the left axilla. Prior right subclavian stenting.  IMPRESSION: Pulmonary venous congestion with small pleural effusions.   Electronically Signed   By: Jorje Guild M.D.   On: 11/27/2014 06:16    Microbiology: No results found for this or any previous visit (from the past 240 hour(s)).   Labs: Basic Metabolic Panel:  Recent Labs Lab 12/17/14 1211 12/17/14 1630 12/18/14 0250 12/18/14 1124 12/19/14 0419 12/20/14 0530  NA 139   --  138 138 136 134*  K 4.0  --  4.4 3.8 4.2 4.9  CL 97  --  98 99 97 94*  CO2 34*  --  31 32 25 27  GLUCOSE 92  --  92 102* 98 86  BUN 22  --  28* 9 23 46*  CREATININE 4.32* 4.68* 5.79* 2.96* 4.70* 7.21*  CALCIUM 8.4  --  8.0* 8.3* 8.0* 7.8*  PHOS  --   --  2.6  --   --   --    Liver Function Tests:  Recent Labs Lab 12/18/14 0250 12/18/14 1124  AST  --  41*  ALT  --  25  ALKPHOS  --  141*  BILITOT  --  0.8  PROT  --  6.4  ALBUMIN 3.1* 3.2*   No results for input(s): LIPASE, AMYLASE in the last 168 hours. No results for input(s): AMMONIA in the last 168 hours. CBC:  Recent Labs Lab 12/15/14 1112  12/17/14 1630 12/18/14 0250 12/18/14 1124 12/19/14 0419 12/20/14 0530  WBC 1.8*  < > 10.2 8.2 5.7 3.7* 3.9*  NEUTROABS 0.6*  --   --   --   --   --   --   HGB 7.6*  < > 7.3* 6.7* 9.1* 8.3* 9.2*  HCT 24.4*  < > 22.5* 20.7* 27.7* 25.4* 28.4*  MCV 101.9*  < > 102.7* 100.5* 95.5 97.7 97.6  PLT 122*  < > 110* 119* 106* 105* 140*  < > = values in this interval not displayed. Cardiac Enzymes:  Recent Labs Lab 12/17/14 1630 12/17/14 2101 12/18/14 1124  TROPONINI 2.12* 2.42* 1.92*   BNP: BNP (last 3 results)  Recent Labs  10/23/14 1117  PROBNP 30772.0*   CBG: No results for input(s): GLUCAP in the last 168 hours.     Signed:  Matilyn Fehrman A  [facility] 12/20/2014, 9:17 AM

## 2014-12-20 NOTE — Progress Notes (Signed)
ANTIBIOTIC CONSULT NOTE - INITIAL  Pharmacy Consult for vancomycin and zosyn Indication:  Rule out aspiration pneumonia  No Known Allergies  Patient Measurements: Height: 5\' 8"  (172.7 cm) Weight: 160 lb 4.8 oz (72.712 kg) IBW/kg (Calculated) : 68.4 Adjusted Body Weight:   Vital Signs: Temp: 99.1 F (37.3 C) (01/24 1705) Temp Source: Oral (01/24 1705) BP: 111/32 mmHg (01/24 1736) Pulse Rate: 78 (01/24 1736) Intake/Output from previous day: 01/23 0701 - 01/24 0700 In: 840 [P.O.:840] Out: -  Intake/Output from this shift: Total I/O In: 600 [P.O.:600] Out: -   Labs:  Recent Labs  12/18/14 1124 12/19/14 0419 12/20/14 0530  WBC 5.7 3.7* 3.9*  HGB 9.1* 8.3* 9.2*  PLT 106* 105* 140*  CREATININE 2.96* 4.70* 7.21*   Estimated Creatinine Clearance: 7.1 mL/min (by C-G formula based on Cr of 7.21). No results for input(s): VANCOTROUGH, VANCOPEAK, VANCORANDOM, GENTTROUGH, GENTPEAK, GENTRANDOM, TOBRATROUGH, TOBRAPEAK, TOBRARND, AMIKACINPEAK, AMIKACINTROU, AMIKACIN in the last 72 hours.   Microbiology: Recent Results (from the past 720 hour(s))  MRSA PCR Screening     Status: None   Collection Time: 11/27/14 11:12 AM  Result Value Ref Range Status   MRSA by PCR NEGATIVE NEGATIVE Final    Comment:        The GeneXpert MRSA Assay (FDA approved for NASAL specimens only), is one component of a comprehensive MRSA colonization surveillance program. It is not intended to diagnose MRSA infection nor to guide or monitor treatment for MRSA infections.     Medical History: Past Medical History  Diagnosis Date  . ESRD on hemodialysis     Hemodialysis since 2003; Occluded access in both upper extremities and the right lower extremity, current access as of Aug 2014 is L thigh AVG. Gets HD MWF at [facility].     . Duodenal ulcer     remote  . Chronic combined systolic and diastolic CHF (congestive heart failure)     a. 02/2012 Echo: EF 65%, basal infolat AK, mild conc  LVH;  b. 06/2013 Echo: EF 40-45%, mild LVH, Gr2 DD, basal inf HK->AK, Mod AS, Mild MR, PASP 73;  c. 09/2014 Echo: EF 45% basal-mid inflat and inf AK, Gr 2DD, mod AS.  Marland Kitchen Arteriosclerotic cardiovascular disease (ASCVD) 1998    a. QQPY-1950; b. 06/2003 Cath: 4/4 patent grafts, EF 55%;  c. 09/2014 Cath: LM 99d, LADnl, LCX nl, RCA 100p, LIMA->LAD nl, VG->OM2->OM3 nl, VG->RPL nl, EF 40-45%.  . Cerebrovascular disease   . Neuropathy of foot   . Degenerative joint disease   . Gastroesophageal reflux disease   . Impaired hearing     Bilateral; hearing aids relatively ineffective  . Hypertension   . Cholelithiasis 03/2011    Diagnosed incidentally on abdominal ultrasound in 03/2011  . Hepatic steatosis   . Macrocytosis 10/20/2012  . Thrombocytopenia 10/20/2012  . Hyperlipidemia   . Secondary hyperparathyroidism   . Peripheral vascular disease   . Gout   . Moderate aortic stenosis     a. 06/2013 Echo: Mod AS, mean grad: 25, peak grad: 57;  b. 09/2014 Echo: EF 45%, Gr 2 DD, Mod AS, valve area 1.15cm^2 (VTI), 1.1cm^2 (Vmax).  . Pulmonary hypertension     a. 06/2013 Echo: PASP 4mmHg.  Marland Kitchen Anemia     Prior blood transfusion  . Complication of anesthesia     daughter reports long episodes confusion after amnesia meds  . Renal cell carcinoma 2001    s/p right nephrectomy-2001; subsequent ESRD  . MDS (myelodysplastic syndrome) without  5q deletion 07/10/2013  . Hypotension   . Pancytopenia   . MRSA infection     a. 08/2014->10/2014 L thigh dialysis graft.  . Infection of AV graft for dialysis     a. Recurrent L thigh infxn s/p revision 10/1, I & D 11/28, and removal 11/01/2014.  Marland Kitchen Acute decompensated heart failure    Assessment: 81 year old male with d/c summary noted, had rapid response called after patient found with labored breathing and lethargic. Patient needs dialysis (vascular congestion on CXR) will do in CCU now. New orders received for vancomycin and zosyn for questionable aspiration.  No  fevers noted, wbc low at 3.9.  Goal of Therapy:  Pre-HD vancomycin trough 15-25  Plan:  Measure antibiotic drug levels at steady state Follow up culture results Vancomycin 1500mg  IV x 1 now the 750mg  qHD  Zosyn 2.25g IV q8 hours  Erin Hearing PharmD., BCPS Clinical Pharmacist Pager [phone] 12/20/2014 6:18 PM

## 2014-12-20 NOTE — Progress Notes (Signed)
[facility] Progress Note  Assessment/Plan: 1. Chest pain/mild CAD/NSTEMI Likely to due demand ischemia from low Hgb; hx AS - denies CP or SOB; Hgb had dropped to 6.7 2. ESRD - MWF- notify his HD  K up to 4.9 due to transfusion 3. Anemia - secondary to CKD and myelodysplaisa - checking Hgb Tuesdays at the Cancer center where he has been being transfused fairly regularly; s/p 2 units 1/21 to 8.3 and 2 units 1/22 to 9.5 4. Secondary hyperparathyroidism - on Hectorol/sensipar/renvela - will not d/c because intake not as restricted at home. 5. HTN/volume - net UF 2.7 1/21; below prior edw - had mild chf on cxr on adm - lower need loweredw for d/c 6. Nutrition - renal diet + suppl/vits 7. Hx RCC s/p right nephrectomy 8. Disp - given weather and frailty, favor continuing to observe - transfuse if Hgb < 8.5 given cardiac history - 9..Heel callouses sore- followed by podiatry - just saw last week - told to float heels 10. SOB - worse this am - sats had been low before I rechecked - on room air - O2 to be started ;check CXR - may need to dialzye today given xtra volume from blood transfusion yesterday  Myriam Jacobson, PA-C [place] [phone] 12/20/2014,10:38 AM  LOS: 3 days   Pt seen, examined and agree w A/P as above. MS has declined since this am; thigh AVG has drained a small amount of pus and he is not somnolent but arousable.  Temp 99.9 which is new. Suspect MS is due to new infection or aspiration.  CXR w new vasc congestion, no wt gain. Plan HD tonight with UF as tolerated. Will need to watch the L thigh area for signs of abcess/ infection.   Kelly Splinter MD pager [phone]    cell [phone] 12/20/2014, 7:24 PM    Subjective:   C/o of SOB ; check O2 sats on room air - O2 applied  Objective Filed Vitals:   12/19/14 1757 12/19/14 2117 12/20/14 0511 12/20/14 0758  BP: 121/46 124/48 109/45   Pulse: 74 80 85   Temp: 97.6 F (36.4 C) 99.3 F (37.4  C) 99.4 F (37.4 C)   TempSrc: Oral Oral Oral   Resp: 18 17 20    Height:      Weight:      SpO2: 97% 95% 85% 90%   Physical Exam General: grpggy - sleepy Heart: RRR 3/6 murmur Lungs:bilateral crackles, labored breathing Abdomen: soft  Extremities: tr edema Dialysis Access: left thigh graft + bruit There are a couple of tiny pustules around the area where he has had partial AVG resection for MRSA infection last Nov 2015. Some drainage was noted by RN tonight and sent for culture.   Dialysis Orders: reid on mwf . EDW 74.5 HD Bath 3.5 Ca , 2.9 k Time 4hr Heparin 1500. Access L fem avg  Hec 3 mcg IV/HD Aranesp 200 Units IV/HD wkly Venofer 0  Other oplab 7.7 12/16/14 37% tfs 3.4 phos , ca 8.2 3.3 alb   Additional Objective Labs: Basic Metabolic Panel:  Recent Labs Lab 12/18/14 0250 12/18/14 1124 12/19/14 0419 12/20/14 0530  NA 138 138 136 134*  K 4.4 3.8 4.2 4.9  CL 98 99 97 94*  CO2 31 32 25 27  GLUCOSE 92 102* 98 86  BUN 28* 9 23 46*  CREATININE 5.79* 2.96* 4.70* 7.21*  CALCIUM 8.0* 8.3* 8.0* 7.8*  PHOS 2.6  --   --   --  Liver Function Tests:  Recent Labs Lab 12/18/14 0250 12/18/14 1124  AST  --  41*  ALT  --  25  ALKPHOS  --  141*  BILITOT  --  0.8  PROT  --  6.4  ALBUMIN 3.1* 3.2*   CBC:  Recent Labs Lab 12/15/14 1112  12/17/14 1630 12/18/14 0250 12/18/14 1124 12/19/14 0419 12/20/14 0530  WBC 1.8*  < > 10.2 8.2 5.7 3.7* 3.9*  NEUTROABS 0.6*  --   --   --   --   --   --   HGB 7.6*  < > 7.3* 6.7* 9.1* 8.3* 9.2*  HCT 24.4*  < > 22.5* 20.7* 27.7* 25.4* 28.4*  MCV 101.9*  < > 102.7* 100.5* 95.5 97.7 97.6  PLT 122*  < > 110* 119* 106* 105* 140*  < > = values in this interval not displayed.  Cardiac Enzymes:  Recent Labs Lab 12/17/14 1630 12/17/14 2101 12/18/14 1124  TROPONINI 2.12* 2.42* 1.92*  Medications:   . allopurinol  100 mg Oral BID  . aspirin EC  81 mg Oral QHS  . cinacalcet  30 mg Oral Q breakfast  . [START  ON 12/23/2014] darbepoetin (ARANESP) injection - DIALYSIS  200 mcg Intravenous Q Wed-HD  . feeding supplement (NEPRO CARB STEADY)  237 mL Oral BID BM  . gabapentin  100 mg Oral q morning - 10a  . gabapentin  200 mg Oral QHS  . heparin  5,000 Units Subcutaneous 3 times per day  . metoprolol tartrate  12.5 mg Oral BID  . midodrine  10 mg Oral Once per day on Sun Tue Thu Sat  . midodrine  10 mg Oral Once per day on Mon Wed Fri  . midodrine  20 mg Oral Once per day on Mon Wed Fri  . midodrine  20 mg Oral Once per day on Sun Tue Thu Sat  . multivitamin  1 tablet Oral QHS  . polyethylene glycol  17 g Oral QHS  . rosuvastatin  20 mg Oral q1800  . sevelamer carbonate  800 mg Oral TID WC

## 2014-12-20 NOTE — Progress Notes (Addendum)
Patient BP 111/32, HR 78, O2 Sats 100 % on 2 Liters n/c.  Patient lethargic and shaking, daughter at bedside.  MD notified.  Renee, RN with hemodialysis was notified to see when patient will be going to HD. Rapid Response RN called.  MD notified for higher level of care.  Spoke with Junita Push, RN 2H. Report given.  All questions answered.

## 2014-12-20 NOTE — Progress Notes (Signed)
Called by primary RN to assess for patient for potential need for higher level of care.  In a prior situation and unable to come to bedside, Arrived to bedside at 1825.  Family at bedside.  PAtient sitting in bed on nasal cannula with some labored breathing and lethargic. Patient also needs dialysis which can not be done in unit at this time.  AS per family, patient has been coughing after drinking liquids through a straw.  Temp is slightly elevated, and has declined since this am.  Agree patient needs higher level of care.  Rn to call if assistance needed

## 2014-12-21 ENCOUNTER — Telehealth: Payer: Self-pay | Admitting: *Deleted

## 2014-12-21 ENCOUNTER — Inpatient Hospital Stay (HOSPITAL_COMMUNITY): Payer: Medicare Other

## 2014-12-21 LAB — POCT I-STAT 3, ART BLOOD GAS (G3+)
Acid-Base Excess: 1 mmol/L (ref 0.0–2.0)
Bicarbonate: 26.6 mEq/L — ABNORMAL HIGH (ref 20.0–24.0)
O2 Saturation: 95 %
Patient temperature: 98.6
TCO2: 28 mmol/L (ref 0–100)
pCO2 arterial: 47.6 mmHg — ABNORMAL HIGH (ref 35.0–45.0)
pH, Arterial: 7.355 (ref 7.350–7.450)
pO2, Arterial: 79 mmHg — ABNORMAL LOW (ref 80.0–100.0)

## 2014-12-21 LAB — BASIC METABOLIC PANEL
Anion gap: 13 (ref 5–15)
BUN: 44 mg/dL — ABNORMAL HIGH (ref 6–23)
CALCIUM: 8.5 mg/dL (ref 8.4–10.5)
CO2: 28 mmol/L (ref 19–32)
CREATININE: 6.93 mg/dL — AB (ref 0.50–1.35)
Chloride: 97 mmol/L (ref 96–112)
GFR calc Af Amer: 7 mL/min — ABNORMAL LOW (ref >90)
GFR, EST NON AFRICAN AMERICAN: 6 mL/min — AB (ref >90)
Glucose, Bld: 89 mg/dL (ref 70–99)
Potassium: 4.7 mmol/L (ref 3.5–5.1)
SODIUM: 138 mmol/L (ref 135–145)

## 2014-12-21 LAB — CBC
HCT: 25.2 % — ABNORMAL LOW (ref 39.0–52.0)
HEMOGLOBIN: 8 g/dL — AB (ref 13.0–17.0)
MCH: 31 pg (ref 26.0–34.0)
MCHC: 31.7 g/dL (ref 30.0–36.0)
MCV: 97.7 fL (ref 78.0–100.0)
PLATELETS: 121 10*3/uL — AB (ref 150–400)
RBC: 2.58 MIL/uL — ABNORMAL LOW (ref 4.22–5.81)
RDW: 21.8 % — ABNORMAL HIGH (ref 11.5–15.5)
WBC: 3.5 10*3/uL — ABNORMAL LOW (ref 4.0–10.5)

## 2014-12-21 MED ORDER — METOPROLOL TARTRATE 12.5 MG HALF TABLET
12.5000 mg | ORAL_TABLET | Freq: Two times a day (BID) | ORAL | Status: DC
  Administered 2014-12-21 – 2014-12-25 (×5): 12.5 mg via ORAL
  Filled 2014-12-21 (×11): qty 1

## 2014-12-21 MED ORDER — RESOURCE THICKENUP CLEAR PO POWD
ORAL | Status: DC | PRN
  Filled 2014-12-21 (×2): qty 125

## 2014-12-21 MED ORDER — CHLORHEXIDINE GLUCONATE 0.12 % MT SOLN
15.0000 mL | Freq: Two times a day (BID) | OROMUCOSAL | Status: DC
  Administered 2014-12-21 – 2014-12-25 (×7): 15 mL via OROMUCOSAL
  Filled 2014-12-21 (×10): qty 15

## 2014-12-21 MED ORDER — CETYLPYRIDINIUM CHLORIDE 0.05 % MT LIQD
7.0000 mL | Freq: Two times a day (BID) | OROMUCOSAL | Status: DC
  Administered 2014-12-21 – 2014-12-24 (×7): 7 mL via OROMUCOSAL

## 2014-12-21 NOTE — Telephone Encounter (Signed)
Daughter called to cancel pt's appts for tomorrow as he is still in the hospital.  She thinks he will be d/c'd home soon and should be able to keep his appt on Tuesday 2/2 as scheduled.

## 2014-12-21 NOTE — Progress Notes (Signed)
ANTIBIOTIC CONSULT NOTE - FOLLOW UP  Pharmacy Consult for vancomycin, zosyn Indication: PNA  No Known Allergies  Patient Measurements: Height: 5\' 8"  (172.7 cm) Weight: 154 lb 15.7 oz (70.3 kg) IBW/kg (Calculated) : 68.4  Vital Signs: Temp: 97.2 F (36.2 C) (01/25 1146) Temp Source: Oral (01/25 1146) BP: 71/33 mmHg (01/25 1147) Pulse Rate: 85 (01/25 1146) Intake/Output from previous day: 01/24 0701 - 01/25 0700 In: 700 [P.O.:600; IV Piggyback:100] Out: 1181  Intake/Output from this shift:    Labs:  Recent Labs  12/19/14 0419 12/20/14 0530 12/21/14 0241  WBC 3.7* 3.9* 3.5*  HGB 8.3* 9.2* 8.0*  PLT 105* 140* 121*  CREATININE 4.70* 7.21* 6.93*   Estimated Creatinine Clearance: 7.4 mL/min (by C-G formula based on Cr of 6.93). No results for input(s): VANCOTROUGH, VANCOPEAK, VANCORANDOM, GENTTROUGH, GENTPEAK, GENTRANDOM, TOBRATROUGH, TOBRAPEAK, TOBRARND, AMIKACINPEAK, AMIKACINTROU, AMIKACIN in the last 72 hours.   Microbiology: Recent Results (from the past 720 hour(s))  MRSA PCR Screening     Status: None   Collection Time: 11/27/14 11:12 AM  Result Value Ref Range Status   MRSA by PCR NEGATIVE NEGATIVE Final    Comment:        The GeneXpert MRSA Assay (FDA approved for NASAL specimens only), is one component of a comprehensive MRSA colonization surveillance program. It is not intended to diagnose MRSA infection nor to guide or monitor treatment for MRSA infections.   Wound culture     Status: None (Preliminary result)   Collection Time: 12/20/14  6:20 PM  Result Value Ref Range Status   Specimen Description WOUND LEFT THIGH GRAFT  Final   Special Requests NONE  Final   Gram Stain   Final    NO WBC SEEN FEW SQUAMOUS EPITHELIAL CELLS PRESENT ABUNDANT GRAM POSITIVE COCCI IN PAIRS Performed at [facility]    Culture   Final    Culture reincubated for better growth Performed at [facility]    Report Status PENDING  Incomplete     Anti-infectives    Start     Dose/Rate Route Frequency Ordered Stop   12/21/14 1200  vancomycin (VANCOCIN) IVPB 750 mg/150 ml premix     750 mg150 mL/hr over 60 Minutes Intravenous Every M-W-F (Hemodialysis) 12/20/14 1816     12/20/14 2200  piperacillin-tazobactam (ZOSYN) IVPB 2.25 g     2.25 g100 mL/hr over 30 Minutes Intravenous 3 times per day 12/20/14 1816     12/20/14 2000  vancomycin (VANCOCIN) 1,500 mg in sodium chloride 0.9 % 500 mL IVPB     1,500 mg250 mL/hr over 120 Minutes Intravenous  Once 12/20/14 1816 12/20/14 2300   12/20/14 0000  azithromycin (ZITHROMAX Z-PAK) 250 MG tablet        12/20/14 7408        Assessment: 81 yo male with possible aspiration PNA on vancomycin and zosyn (noted with hx MRSA AVG infection.  He is noted with ESRD and HD 1/24 (300 BFR for about 2 hours) and for HD today to complete 4 hours total.    1/24 vanc 1/24 zosyn  1/24 left thigh graft   Goal of Therapy:  Pre-HD vancomycin trough 15-25  Plan:  -Start vancomycin 750mg  with HD on 1/27 (since HD session today will complete his session from 1/24) -No zosyn changes needed -Will follow  cultures and clinical progress  Hildred Laser, Pharm D 12/21/2014 1:37 PM

## 2014-12-21 NOTE — Procedures (Signed)
Objective Swallowing Evaluation: Modified Barium Swallowing Study  Patient Details  Name: Andrew [name] MRN: [id number] Date of Birth: February 05, 1926  Today's Date: 12/21/2014 Time: SLP Start Time (ACUTE ONLY): 0135-SLP Stop Time (ACUTE ONLY): 0200 SLP Time Calculation (min) (ACUTE ONLY): 25 min  Past Medical History:  Past Medical History  Diagnosis Date  . ESRD on hemodialysis     Hemodialysis since 2003; Occluded access in both upper extremities and the right lower extremity, current access as of Aug 2014 is L thigh AVG. Gets HD MWF at [facility].     . Duodenal ulcer     remote  . Chronic combined systolic and diastolic CHF (congestive heart failure)     a. 02/2012 Echo: EF 65%, basal infolat AK, mild conc LVH;  b. 06/2013 Echo: EF 40-45%, mild LVH, Gr2 DD, basal inf HK->AK, Mod AS, Mild MR, PASP 73;  c. 09/2014 Echo: EF 45% basal-mid inflat and inf AK, Gr 2DD, mod AS.  Marland Kitchen Arteriosclerotic cardiovascular disease (ASCVD) 1998    a. [id number]; b. 06/2003 Cath: 4/4 patent grafts, EF 55%;  c. 09/2014 Cath: LM 99d, LADnl, LCX nl, RCA 100p, LIMA->LAD nl, VG->OM2->OM3 nl, VG->RPL nl, EF 40-45%.  . Cerebrovascular disease   . Neuropathy of foot   . Degenerative joint disease   . Gastroesophageal reflux disease   . Impaired hearing     Bilateral; hearing aids relatively ineffective  . Hypertension   . Cholelithiasis 03/2011    Diagnosed incidentally on abdominal ultrasound in 03/2011  . Hepatic steatosis   . Macrocytosis 10/20/2012  . Thrombocytopenia 10/20/2012  . Hyperlipidemia   . Secondary hyperparathyroidism   . Peripheral vascular disease   . Gout   . Moderate aortic stenosis     a. 06/2013 Echo: Mod AS, mean grad: 25, peak grad: 57;  b. 09/2014 Echo: EF 45%, Gr 2 DD, Mod AS, valve area 1.15cm^2 (VTI), 1.1cm^2 (Vmax).  . Pulmonary hypertension     a. 06/2013 Echo: PASP 49mmHg.  Marland Kitchen Anemia     Prior blood transfusion  . Complication of anesthesia     daughter reports long  episodes confusion after amnesia meds  . Renal cell carcinoma 2001    s/p right nephrectomy-2001; subsequent ESRD  . 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 Hypotension   . Pancytopenia   . MRSA infection     a. 08/2014->10/2014 L thigh dialysis graft.  . Infection of AV graft for dialysis     a. Recurrent L thigh infxn s/p revision 10/1, I & D 11/28, and removal 11/01/2014.  Marland Kitchen Acute decompensated heart failure    Past Surgical History:  Past Surgical History  Procedure Laterality Date  . Right nephrectomy  2001    Renal cell carcinoma  . Colonoscopy  01/24/2011    prominent vascular pattern, suboptimal prep but doable  . Esophagogastroduodenoscopy  01/24/2011    mild erosive reflux esophagitis, bulbar/antral erosions, bx from antrum benign  . Av fistula repair  2008    revision of anastomosis of Right AVF  . Arteriovenous graft placement  2006    Thrombectomy and interposition jump graft revision to higher axillary vein of LUA AVG  . Coronary artery bypass graft  1998    X4 VESSELS  . Multiple cysto/ resection tumor's with bx's  LAST ONE 05-09-2011  . Multiple surg's / interventions for avgg/ fistula right upper arm  LAST REVISION 06-29-2011    (JUNE [id number] [place] VEIN/ [id number] DILATATION ANGIOPLASTY)  .  Left ptc left renal artery    . Cardiac catheterization  2004  . Cataract extraction w/ intraocular lens  implant, bilateral    . Cystoscopy w/ retrogrades  12/28/2011    Procedure: CYSTOSCOPY WITH RETROGRADE PYELOGRAM;  Surgeon: [name], MD;  Location: [facility];  Service: Urology;  Laterality: Left;  C-ARM   . Transurethral resection of bladder tumor  12/28/2011    Procedure: TRANSURETHRAL RESECTION OF BLADDER TUMOR (TURBT);  Surgeon: [name], MD;  Location: [facility];  Service: Urology;  Laterality: N/A;  . Av fistula placement  02/29/2012    Procedure: INSERTION OF ARTERIOVENOUS (AV) GORE-TEX GRAFT  THIGH;  Surgeon: [name], MD;  Location: [facility] OR;  Service: Vascular;  Laterality: Right;  Insertion right femoral arteriovenous gortex graft  . Ligation of right braciocephalic av fistula Right 04-04-2012  . Insertion of dialysis catheter  06/05/2012    Procedure: INSERTION OF DIALYSIS CATHETER;  Surgeon: Pryor Ochoa, MD;  Location: [facility] OR;  Service: Vascular;  Laterality: Left;  Insertion diatek catheter left IJ  . Insertion of dialysis catheter  08/22/2012    Procedure: INSERTION OF DIALYSIS CATHETER;  Surgeon: Nada Libman, MD;  Location: [facility];  Service: Vascular;  Laterality: Left;  insertion of dialysis catheter left  internal jugular 27cm  . Av fistula placement  10/08/2012    Procedure: INSERTION OF ARTERIOVENOUS (AV) GORE-TEX GRAFT THIGH;  Surgeon: Chuck Hint, MD;  Location: [facility] OR;  Service: Vascular;  Laterality: Left;  . Cystoscopy with biopsy N/A 08/07/2013    Procedure: CYSTOSCOPY WITH BLADDER BIOPSY;  Surgeon: [name], MD;  Location: [facility]S;  Service: Urology;  Laterality: N/A;  . Fulguration of bladder tumor N/A 08/07/2013    Procedure: FULGURATION OF BLADDER TUMOR;  Surgeon: [name], MD;  Location: [facility];  Service: Urology;  Laterality: N/A;  . Revision of arteriovenous goretex graft Left 08/27/2014    Procedure: THROMBECTOMY & REVISION OF LEFT ARM  ARTERIOVENOUS GORETEX GRAFT;  Surgeon: Chuck Hint, MD;  Location: [facility] OR;  Service: Vascular;  Laterality: Left;  . Revision of arteriovenous goretex graft Left 10/01/2014    Procedure: SEGMENT OF Left THIGH ARTERIOVENOUS GORETEX GRAFT Removed;  Surgeon: Pryor Ochoa, MD;  Location: [facility] OR;  Service: Vascular;  Laterality: Left;  . Removal of graft Left 11/01/2014    Procedure: REMOVAL OF INFECTED LEFT THIGH GRAFT;  Surgeon: Nada Libman, MD;  Location: [facility] OR;  Service: Vascular;  Laterality: Left;  removal of infected left dialysis graft.  . Left heart catheterization with coronary/graft  angiogram N/A 10/27/2014    Procedure: LEFT HEART CATHETERIZATION WITH Isabel Caprice;  Surgeon: Marykay Lex, MD;  Location: [facility] CATH LAB;  Service: Cardiovascular;  Laterality: N/A;  . Tonsillectomy     HPI:  HPI: 81 y/o ? ESRD m/w/f,  H/o CVA, suspect COPD recent admission 11/27/14-11/30/14 Decompensated HF 2/2 to dietary indiscretion  No Data Recorded  Assessment / Plan / Recommendation CHL IP CLINICAL IMPRESSIONS 12/21/2014  Dysphagia Diagnosis (None)  Clinical impression Pt with oral/pharyngeal dysphagia characterized by delayed oral transit, uncoordinated mastication, delay in the swallow trigger, valleculae residue and pharyngeal weakness.  Penetration occured during the swallow impacted by decreased epiglottic inversion due to sensory and motor deficits.  Chin tuck was not effective to eliminate penetration with thin liquid boluses from a cup.  Esophageal sweep revealed it mildly slow to clear.  Rx begin dysphagia  2 diet with nectar thick liquids.  Pt to be totally upright for all intake and should not use straws.        CHL IP TREATMENT RECOMMENDATION 12/21/2014  Treatment Plan Recommendations Therapy as outlined in treatment plan below     CHL IP DIET RECOMMENDATION 12/21/2014  Diet Recommendations Dysphagia 2 (Fine chop);Nectar-thick liquid  Liquid Administration via Cup;No straw  Medication Administration Whole meds with puree  Compensations Slow rate;Small sips/bites;Check for anterior loss;Follow solids with liquid  Postural Changes and/or Swallow Maneuvers Out of bed for meals;Seated upright 90 degrees;Upright 30-60 min after meal     CHL IP OTHER RECOMMENDATIONS 12/21/2014  Recommended Consults (None)  Oral Care Recommendations Oral care Q4 per protocol  Other Recommendations Order thickener from pharmacy;Prohibited food (jello, ice cream, thin soups);Remove water pitcher     CHL IP FOLLOW UP RECOMMENDATIONS 12/21/2014  Follow up Recommendations Home health SLP      CHL IP FREQUENCY AND DURATION 12/21/2014  Speech Therapy Frequency (ACUTE ONLY) min 2x/week  Treatment Duration 2 weeks     Pertinent Vitals/Pain None verbalized    SLP Swallow Goals No flowsheet data found.  No flowsheet data found.    CHL IP REASON FOR REFERRAL 12/21/2014  Reason for Referral Objectively evaluate swallowing function     CHL IP ORAL PHASE 12/21/2014  Lips (None)  Tongue (None)  Mucous membranes (None)  Nutritional status (None)  Other (None)  Oxygen therapy (None)  Oral Phase Impaired  Oral - Pudding Teaspoon WFL;Delayed oral transit  Oral - Pudding Cup (None)  Oral - Honey Teaspoon (None)  Oral - Honey Cup (None)  Oral - Honey Syringe (None)  Oral - Nectar Teaspoon (None)  Oral - Nectar Cup Right anterior bolus loss  Oral - Nectar Straw (None)  Oral - Nectar Syringe (None)  Oral - Ice Chips (None)  Oral - Thin Teaspoon Right anterior bolus loss  Oral - Thin Cup Right anterior bolus loss  Oral - Thin Straw (None)  Oral - Thin Syringe (None)  Oral - Puree (None)  Oral - Mechanical Soft (None)  Oral - Regular Weak lingual manipulation;Impaired mastication;Delayed oral transit  Oral - Multi-consistency (None)  Oral - Pill (None)  Oral Phase - Comment Pt with some tongue base residue for purees that would spill into the vallecula.        CHL IP PHARYNGEAL PHASE 12/21/2014  Pharyngeal Phase Impaired  Pharyngeal - Pudding Teaspoon Delayed swallow initiation;Reduced epiglottic inversion;Premature spillage to valleculae;Pharyngeal residue - valleculae  Penetration/Aspiration details (pudding teaspoon) (None)  Pharyngeal - Pudding Cup (None)  Penetration/Aspiration details (pudding cup) (None)  Pharyngeal - Honey Teaspoon (None)  Penetration/Aspiration details (honey teaspoon) (None)  Pharyngeal - Honey Cup (None)  Penetration/Aspiration details (honey cup) (None)  Pharyngeal - Honey Syringe (None)  Penetration/Aspiration details (honey syringe) (None)   Pharyngeal - Nectar Teaspoon (None)  Penetration/Aspiration details (nectar teaspoon) (None)  Pharyngeal - Nectar Cup Delayed swallow initiation;Reduced epiglottic inversion;Pharyngeal residue - valleculae;Premature spillage to valleculae  Penetration/Aspiration details (nectar cup) (None)  Pharyngeal - Nectar Straw (None)  Penetration/Aspiration details (nectar straw) (None)  Pharyngeal - Nectar Syringe (None)  Penetration/Aspiration details (nectar syringe) (None)  Pharyngeal - Ice Chips (None)  Penetration/Aspiration details (ice chips) (None)  Pharyngeal - Thin Teaspoon Delayed swallow initiation;Reduced epiglottic inversion;Pharyngeal residue - valleculae;Premature spillage to valleculae;Premature spillage to pyriform sinuses  Penetration/Aspiration details (thin teaspoon) (None)  Pharyngeal - Thin Cup Delayed swallow initiation;Premature spillage to valleculae;Premature spillage to pyriform sinuses;Reduced epiglottic inversion;Penetration/Aspiration during  swallow;Trace aspiration;Pharyngeal residue - valleculae  Penetration/Aspiration details (thin cup) Material enters airway, remains ABOVE vocal cords and not ejected out  Pharyngeal - Thin Straw (None)  Penetration/Aspiration details (thin straw) (None)  Pharyngeal - Thin Syringe (None)  Penetration/Aspiration details (thin syringe') (None)  Pharyngeal - Puree Delayed swallow initiation;Premature spillage to valleculae;Reduced epiglottic inversion;Pharyngeal residue - valleculae  Penetration/Aspiration details (puree) (None)  Pharyngeal - Mechanical Soft (None)  Penetration/Aspiration details (mechanical soft) (None)  Pharyngeal - Regular Delayed swallow initiation;Premature spillage to valleculae;Reduced epiglottic inversion;Pharyngeal residue - valleculae  Penetration/Aspiration details (regular) (None)  Pharyngeal - Multi-consistency (None)  Penetration/Aspiration details (multi-consistency) (None)  Pharyngeal - Pill (None)   Penetration/Aspiration details (pill) (None)  Pharyngeal Comment (None)     CHL IP CERVICAL ESOPHAGEAL PHASE 12/21/2014  Cervical Esophageal Phase WFL  Pudding Teaspoon (None)  Pudding Cup (None)  Honey Teaspoon (None)  Honey Cup (None)  Honey Syringe (None)  Nectar Teaspoon (None)  Nectar Cup (None)  Nectar Straw (None)  Nectar Syringe (None)  Thin Teaspoon (None)  Thin Cup (None)  Thin Straw (None)  Thin Syringe (None)  Cervical Esophageal Comment (None)    No flowsheet data found.         Lamar Sprinkles 12/21/2014, 2:49 PM  Shelly Flatten, [place], [place] [facility] SLP [phone]

## 2014-12-21 NOTE — Progress Notes (Signed)
[facility] PROGRESS NOTE   Andrew [name] YKD:[id number] DOB: [birth year] DOA: 12/17/2014 PCP: [name]., MD  HPI/Subjective: Was about to be discharged yesterday, his mental status declined progressively. He developed fever, cough and lethargy. Aspiration pneumonia versus fluid overload.  Assessment/Plan: Principal Problem:   Elevated troponin Active Problems:   Anemia in neoplastic disease   Peripheral vascular disease with ABI's 07/2012, and L renal artery stenosis s/p PTCA   Cerebrovascular disease   Acute systolic heart failure   End stage renal disease, M/W/F   ESRD on dialysis   Chest pain   Acute decompensated heart failure   Pulmonary hypertension   Acute systolic CHF (congestive heart failure), NYHA class 3    Non-STEMI Patient presented with troponin of 0.5, went up to 2.42, cardiology consulted. Patient is on aspirin, started metoprolol. Started Crestor and check FLP in a.m. This is likely type II non-STEMI from significant anemia. Patient had recent cardiac cath with clean coronaries in December 2015. Cardiology for further recommendation, keep hemoglobin above 8.0. The troponin trending down, per cardiology no further workup.  Aspiration pneumonia Family reported cough with meals, developed fever of 99.9, no leukocytosis. Blood cultures obtained and started on broad-spectrum antibiotics, vancomycin and Zosyn. ABG did not show acidosis, slight PCO2 elevation at 47.6. PO2 is 79. Currently nothing by mouth, obtain SLP evaluation.   Anemia in neoplastic disease Patient has MDS, gets transfusion frequently, presented with hemoglobin of 7.3, dropped to 6.7. Hemoglobin is 9.1 after transfusion of 2 units of packed RBCs.  ESRD Nephrology consulted, undergone dialysis already. Patient tells his days are Monday Wednesday and Friday.  Chronic combined diastolic and systolic CHF Last 2-D echocardiogram on 10/24/2014 showed LVEF of 45% in grade 2  diastolic dysfunction. Patient does not appear to be fluid overloaded now after dialysis.  Code Status: Full Code Family Communication: Plan discussed with the patient. Disposition Plan: Remains inpatient Diet:  NPO  Consultants:  Cardiology  Procedures:  None  Antibiotics:  None   Objective: Filed Vitals:   12/21/14 0748  BP:   Pulse:   Temp: 98.1 F (36.7 C)  Resp:     Intake/Output Summary (Last 24 hours) at 12/21/14 1005 Last data filed at 12/21/14 0541  Gross per 24 hour  Intake    340 ml  Output   1181 ml  Net   -841 ml   Filed Weights   12/18/14 2013 12/20/14 1940 12/20/14 2200  Weight: 72.712 kg (160 lb 4.8 oz) 71.9 kg (158 lb 8.2 oz) 70.3 kg (154 lb 15.7 oz)    Exam: General: Alert and awake, oriented x3, not in any acute distress. HEENT: anicteric sclera, pupils reactive to light and accommodation, EOMI CVS: S1-S2 clear, no murmur rubs or gallops Chest: clear to auscultation bilaterally, no wheezing, rales or rhonchi Abdomen: soft nontender, nondistended, normal bowel sounds, no organomegaly Extremities: no cyanosis, clubbing or edema noted bilaterally Neuro: Cranial nerves II-XII intact, no focal neurological deficits  Data Reviewed: Basic Metabolic Panel:  Recent Labs Lab 12/18/14 0250 12/18/14 1124 12/19/14 0419 12/20/14 0530 12/21/14 0241  NA 138 138 136 134* 138  K 4.4 3.8 4.2 4.9 4.7  CL 98 99 97 94* 97  CO2 31 32 25 27 28   GLUCOSE 92 102* 98 86 89  BUN 28* 9 23 46* 44*  CREATININE 5.79* 2.96* 4.70* 7.21* 6.93*  CALCIUM 8.0* 8.3* 8.0* 7.8* 8.5  PHOS 2.6  --   --   --   --  Liver Function Tests:  Recent Labs Lab 12/18/14 0250 12/18/14 1124  AST  --  41*  ALT  --  25  ALKPHOS  --  141*  BILITOT  --  0.8  PROT  --  6.4  ALBUMIN 3.1* 3.2*   No results for input(s): LIPASE, AMYLASE in the last 168 hours. No results for input(s): AMMONIA in the last 168 hours. CBC:  Recent Labs Lab 12/15/14 1112  12/18/14 0250  12/18/14 1124 12/19/14 0419 12/20/14 0530 12/21/14 0241  WBC 1.8*  < > 8.2 5.7 3.7* 3.9* 3.5*  NEUTROABS 0.6*  --   --   --   --   --   --   HGB 7.6*  < > 6.7* 9.1* 8.3* 9.2* 8.0*  HCT 24.4*  < > 20.7* 27.7* 25.4* 28.4* 25.2*  MCV 101.9*  < > 100.5* 95.5 97.7 97.6 97.7  PLT 122*  < > 119* 106* 105* 140* 121*  < > = values in this interval not displayed. Cardiac Enzymes:  Recent Labs Lab 12/17/14 1630 12/17/14 2101 12/18/14 1124  TROPONINI 2.12* 2.42* 1.92*   BNP (last 3 results)  Recent Labs  10/23/14 1117  PROBNP 30772.0*   CBG: No results for input(s): GLUCAP in the last 168 hours.  Micro Recent Results (from the past 240 hour(s))  Wound culture     Status: None (Preliminary result)   Collection Time: 12/20/14  6:20 PM  Result Value Ref Range Status   Specimen Description WOUND LEFT THIGH GRAFT  Final   Special Requests NONE  Final   Gram Stain   Final    NO WBC SEEN FEW SQUAMOUS EPITHELIAL CELLS PRESENT ABUNDANT GRAM POSITIVE COCCI IN PAIRS Performed at [facility]    Culture   Final    Culture reincubated for better growth Performed at [facility]    Report Status PENDING  Incomplete     Studies: Dg Chest Port 1 View  12/21/2014   CLINICAL DATA:  Aspiration.  EXAM: PORTABLE CHEST - 1 VIEW  COMPARISON:  12/20/2014.  FINDINGS: Mediastinum and hilar structures normal. Prior CABG. Stable cardiomegaly and pulmonary vascular congestion with stable mild interstitial prominence. These findings suggest congestive heart failure. Pneumonitis project in the right lung base cannot be excluded. No pleural effusion or pneumothorax. Right subclavian vascular stents are noted. The stents are in and angled position.  IMPRESSION: 1. Prior CABG. Persistent changes of mild congestive heart failure. Pneumonitis cannot be excluded, particularly in the right lung base. No interim change from prior exam . 2. Right subclavian vascular stents are noted. They are in an  angled position.   Electronically Signed   By: Marcello Moores  Register   On: 12/21/2014 07:32   Dg Chest Port 1 View  12/20/2014   CLINICAL DATA:  Chest pain and shortness of Breath  EXAM: PORTABLE CHEST - 1 VIEW  COMPARISON:  12/17/2014  FINDINGS: The heart size is mildly enlarged. Atherosclerotic calcification noted within the aortic arch. Previous median sternotomy and CABG procedure. Moderate pulmonary vascular congestion is noted. No airspace consolidation.  IMPRESSION: Mild cardiac enlargement and pulmonary vascular congestion   Electronically Signed   By: Kerby Moors M.D.   On: 12/20/2014 11:29    Scheduled Meds: . allopurinol  100 mg Oral BID  . aspirin EC  81 mg Oral QHS  . cinacalcet  30 mg Oral Q breakfast  . [START ON 12/23/2014] darbepoetin (ARANESP) injection - DIALYSIS  200 mcg Intravenous Q Wed-HD  .  feeding supplement (NEPRO CARB STEADY)  237 mL Oral BID BM  . gabapentin  100 mg Oral q morning - 10a  . gabapentin  200 mg Oral QHS  . heparin  5,000 Units Subcutaneous 3 times per day  . metoprolol tartrate  12.5 mg Oral BID  . midodrine  10 mg Oral Once per day on Sun Tue Thu Sat  . midodrine  10 mg Oral Once per day on Mon Wed Fri  . midodrine  20 mg Oral Once per day on Mon Wed Fri  . midodrine  20 mg Oral Once per day on Sun Tue Thu Sat  . multivitamin  1 tablet Oral QHS  . piperacillin-tazobactam (ZOSYN)  IV  2.25 g Intravenous 3 times per day  . polyethylene glycol  17 g Oral QHS  . rosuvastatin  20 mg Oral q1800  . sevelamer carbonate  800 mg Oral TID WC  . vancomycin  750 mg Intravenous Q M,W,F-HD   Continuous Infusions:      Time spent: 35 minutes    [facility] A  [facility] Pager [phone] If 7PM-7AM, please contact night-coverage at [email or web address], password [facility] 12/21/2014, 10:05 AM  LOS: 4 days

## 2014-12-21 NOTE — Evaluation (Signed)
Clinical/Bedside Swallow Evaluation Patient Details  Name: Andrew [name] MRN: [id number] Date of Birth: [birth year]  Today's Date: 12/21/2014 Time: SLP Start Time (ACUTE ONLY): 1202 SLP Stop Time (ACUTE ONLY): 1217 SLP Time Calculation (min) (ACUTE ONLY): 15 min  Past Medical History:  Past Medical History  Diagnosis Date  . ESRD on hemodialysis     Hemodialysis since 2003; Occluded access in both upper extremities and the right lower extremity, current access as of Aug 2014 is L thigh AVG. Gets HD MWF at [facility].     . Duodenal ulcer     remote  . Chronic combined systolic and diastolic CHF (congestive heart failure)     a. 02/2012 Echo: EF 65%, basal infolat AK, mild conc LVH;  b. 06/2013 Echo: EF 40-45%, mild LVH, Gr2 DD, basal inf HK->AK, Mod AS, Mild MR, PASP 73;  c. 09/2014 Echo: EF 45% basal-mid inflat and inf AK, Gr 2DD, mod AS.  Marland Kitchen Arteriosclerotic cardiovascular disease (ASCVD) 1998    a. [id number]; b. 06/2003 Cath: 4/4 patent grafts, EF 55%;  c. 09/2014 Cath: LM 99d, LADnl, LCX nl, RCA 100p, LIMA->LAD nl, VG->OM2->OM3 nl, VG->RPL nl, EF 40-45%.  . Cerebrovascular disease   . Neuropathy of foot   . Degenerative joint disease   . Gastroesophageal reflux disease   . Impaired hearing     Bilateral; hearing aids relatively ineffective  . Hypertension   . Cholelithiasis 03/2011    Diagnosed incidentally on abdominal ultrasound in 03/2011  . Hepatic steatosis   . Macrocytosis 10/20/2012  . Thrombocytopenia 10/20/2012  . Hyperlipidemia   . Secondary hyperparathyroidism   . Peripheral vascular disease   . Gout   . Moderate aortic stenosis     a. 06/2013 Echo: Mod AS, mean grad: 25, peak grad: 57;  b. 09/2014 Echo: EF 45%, Gr 2 DD, Mod AS, valve area 1.15cm^2 (VTI), 1.1cm^2 (Vmax).  . Pulmonary hypertension     a. 06/2013 Echo: PASP 51mmHg.  Marland Kitchen Anemia     Prior blood transfusion  . Complication of anesthesia     daughter reports long episodes confusion after amnesia meds   . Renal cell carcinoma 2001    s/p right nephrectomy-2001; subsequent ESRD  . 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 Hypotension   . Pancytopenia   . MRSA infection     a. 08/2014->10/2014 L thigh dialysis graft.  . Infection of AV graft for dialysis     a. Recurrent L thigh infxn s/p revision 10/1, I & D 11/28, and removal 11/01/2014.  Marland Kitchen Acute decompensated heart failure    Past Surgical History:  Past Surgical History  Procedure Laterality Date  . Right nephrectomy  2001    Renal cell carcinoma  . Colonoscopy  01/24/2011    prominent vascular pattern, suboptimal prep but doable  . Esophagogastroduodenoscopy  01/24/2011    mild erosive reflux esophagitis, bulbar/antral erosions, bx from antrum benign  . Av fistula repair  2008    revision of anastomosis of Right AVF  . Arteriovenous graft placement  2006    Thrombectomy and interposition jump graft revision to higher axillary vein of LUA AVG  . Coronary artery bypass graft  1998    X4 VESSELS  . Multiple cysto/ resection tumor's with bx's  LAST ONE 05-09-2011  . Multiple surg's / interventions for avgg/ fistula right upper arm  LAST REVISION 06-29-2011    (JUNE 0350 [place] VEIN/ 09-38-1829 DILATATION ANGIOPLASTY)  . Left ptc left renal  artery    . Cardiac catheterization  2004  . Cataract extraction w/ intraocular lens  implant, bilateral    . Cystoscopy w/ retrogrades  12/28/2011    Procedure: CYSTOSCOPY WITH RETROGRADE PYELOGRAM;  Surgeon: Malka So, MD;  Location: [facility];  Service: Urology;  Laterality: Left;  C-ARM   . Transurethral resection of bladder tumor  12/28/2011    Procedure: TRANSURETHRAL RESECTION OF BLADDER TUMOR (TURBT);  Surgeon: Malka So, MD;  Location: [facility];  Service: Urology;  Laterality: N/A;  . Av fistula placement  02/29/2012    Procedure: INSERTION OF ARTERIOVENOUS (AV) GORE-TEX GRAFT THIGH;  Surgeon: Rosetta Posner, MD;   Location: [place];  Service: Vascular;  Laterality: Right;  Insertion right femoral arteriovenous gortex graft  . Ligation of right braciocephalic av fistula Right 04-04-2012  . Insertion of dialysis catheter  06/05/2012    Procedure: INSERTION OF DIALYSIS CATHETER;  Surgeon: Mal Misty, MD;  Location: [place];  Service: Vascular;  Laterality: Left;  Insertion diatek catheter left IJ  . Insertion of dialysis catheter  08/22/2012    Procedure: INSERTION OF DIALYSIS CATHETER;  Surgeon: Serafina Mitchell, MD;  Location: [facility];  Service: Vascular;  Laterality: Left;  insertion of dialysis catheter left  internal jugular 27cm  . Av fistula placement  10/08/2012    Procedure: INSERTION OF ARTERIOVENOUS (AV) GORE-TEX GRAFT THIGH;  Surgeon: Angelia Mould, MD;  Location: [place];  Service: Vascular;  Laterality: Left;  . Cystoscopy with biopsy N/A 08/07/2013    Procedure: CYSTOSCOPY WITH BLADDER BIOPSY;  Surgeon: Malka So, MD;  Location: [facility];  Service: Urology;  Laterality: N/A;  . Fulguration of bladder tumor N/A 08/07/2013    Procedure: FULGURATION OF BLADDER TUMOR;  Surgeon: Malka So, MD;  Location: [facility];  Service: Urology;  Laterality: N/A;  . Revision of arteriovenous goretex graft Left 08/27/2014    Procedure: THROMBECTOMY & REVISION OF LEFT ARM  ARTERIOVENOUS GORETEX GRAFT;  Surgeon: Angelia Mould, MD;  Location: [place];  Service: Vascular;  Laterality: Left;  . Revision of arteriovenous goretex graft Left 10/01/2014    Procedure: SEGMENT OF Left THIGH ARTERIOVENOUS GORETEX GRAFT Removed;  Surgeon: Mal Misty, MD;  Location: [place];  Service: Vascular;  Laterality: Left;  . Removal of graft Left 11/01/2014    Procedure: REMOVAL OF INFECTED LEFT THIGH GRAFT;  Surgeon: Serafina Mitchell, MD;  Location: [place] [facility];  Service: Vascular;  Laterality: Left;  removal of infected left dialysis graft.  . Left heart catheterization with coronary/graft angiogram N/A 10/27/2014    Procedure:  LEFT HEART CATHETERIZATION WITH Beatrix Fetters;  Surgeon: Leonie Man, MD;  Location: [facility] C[facility];  Service: Cardiovascular;  Laterality: N/A;  . Tonsillectomy     HPI:  81 y/o ? ESRD m/w/f,  H/o CVA, suspect COPD recent admission 11/27/14-11/30/14 Decompensated HF 2/2 to dietary indiscretion.  Pt with previous CVA.  Pt currently with ? Aspiration PNA.  Current chest x-ray on 12/21/14 read forPersistent changes of mild congestive heart failure. Pneumonitis cannot be excluded, particularly in the right lung base.   Assessment / Plan / Recommendation Clinical Impression  Clinically the pt presents with oral and pharyngeal dysphagia c/b delayed oral transit, particularly for solids and delayed swallow trigger.  Pt with overt s/s of aspiration given cup sips of thin at end of BSE and well as increase SHOB with straw sips.  In addition, the  pt's dtr and nurse have reported coughing with intake, particularly straw sips of thin.  Rx keep pt NPO pending the results of MBS to determine least restrctive diet.      Aspiration Risk  Moderate    Diet Recommendation NPO   Medication Administration: Whole meds with puree    Other  Recommendations Recommended Consults: MBS Oral Care Recommendations: Oral care Q4 per protocol         Pertinent Vitals/Pain       Swallow Study Prior Functional Status   Per the pt's daughter the pt was on a regular diet and thin liquids at home.  She does report what sounds to be delayed oral transit time especially for solids.      General Date of Onset: 12/17/14 HPI: 81 y/o ? ESRD m/w/f,  H/o CVA, suspect COPD recent admission 11/27/14-11/30/14 Decompensated HF 2/2 to dietary indiscretion Type of Study: Bedside swallow evaluation Previous Swallow Assessment: None Diet Prior to this Study: NPO (Per dtr pt was on a regular/thin diet prior to admission) Respiratory Status: Nasal cannula History of Recent Intubation: No Behavior/Cognition: Alert;Cooperative Oral  Cavity - Dentition: Missing dentition (Pt is missing all his lower molars) Self-Feeding Abilities: Able to feed self;Needs assist Patient Positioning: Upright in chair Baseline Vocal Quality: Clear Volitional Cough: Strong Volitional Swallow: Able to elicit    Oral/Motor/Sensory Function Labial ROM: Within Functional Limits Labial Symmetry: Within Functional Limits Labial Strength: Within Functional Limits Labial Sensation: Within Functional Limits Lingual ROM: Within Functional Limits Lingual Symmetry: Within Functional Limits Lingual Strength: Within Functional Limits Facial ROM: Within Functional Limits Facial Symmetry: Within Functional Limits Facial Strength: Within Functional Limits Mandible: Within Functional Limits   Ice Chips Ice chips: Not tested   Thin Liquid Thin Liquid: Impaired Presentation: Cup;Self Fed;Spoon;Straw Pharyngeal  Phase Impairments: Suspected delayed Swallow;Multiple swallows;Cough - Immediate (Cough noted on cup sip of thin at end of BSE.  ) Other Comments: Pt with inconsistent multiple swallows to clear boluses.  Increased SHOB noted with straw sips and cough noted at end of BSE on cup sip.      Nectar Thick Nectar Thick Liquid: Not tested   Honey Thick Honey Thick Liquid: Not tested   Puree Puree: Impaired Presentation: Self Fed;Spoon Oral Phase Impairments: Impaired anterior to posterior transit Oral Phase Functional Implications: Prolonged oral transit Pharyngeal Phase Impairments: Suspected delayed Swallow   Solid   GO    Solid: Impaired Presentation: Self Fed Oral Phase Impairments: Impaired anterior to posterior transit;Impaired mastication Oral Phase Functional Implications: Oral residue Pharyngeal Phase Impairments: Suspected delayed Tonette Lederer, Lenna Sciara N 12/21/2014,12:47 PM  Shelly Flatten, [place], [facility] SLP [phone]

## 2014-12-21 NOTE — Progress Notes (Signed)
[facility] Progress Note   Subjective: 1.1 kg off w HD last evening, BP's dropped into 70's limiting UF  Filed Vitals:   12/21/14 0500 12/21/14 0600 12/21/14 0700 12/21/14 0748  BP: 113/31 99/34 110/34   Pulse: 97 92 92   Temp:    98.1 F (36.7 C)  TempSrc:      Resp: 26 20 19    Height:      Weight:      SpO2: 93% 100% 100%    Exam: Sleeping, responsive, no distress No jvd Chest mostly clear, some scant rales R base RRR 2/6 M at LUSB Abd soft, NTND No LE or UE edema Left thigh AVG +bruit, healing wound lateral aspect, no drainage at this time Neuro responds to commands, lethargic, nonfocal  HD: MWF [facility] 4h   74.5kg   2K/3.5 Ca [place]    Heparin 1500   L fem AVG Hect 3 ug, Aranesp 200 / wk, Venofer none Lab: Hb 7.7, tfs 37%, phos 3.4, Ca 8.2, alb 3.3        Assessment: 1. Fever / hypoxemia / new pulm infiltrates - heart failure vs asp pneumonitis, favor latter as unable to remove fluid and below dry wt, also has been coughing w every meal according to family. On vanc/zosyn. 2. Drainage L thigh - culture pending, hx MRSA AVG infection w partial graft excision late 2015 3. ESRD on HD MWF 4. Anemia d/t CKD/ MDS - requires tranfusion frequently, max esa 5. MBD - cont meds 6. Chest pain on admission / hx CABG - demand ischemia per cards, last cath 11/15 w patent grafts 7. HTN/vol - below dry wt, no vol excess  Plan- short HD today to complete 4 hours    Kelly Splinter MD  pager [phone]    cell [phone]  12/21/2014, 7:52 AM     Recent Labs Lab 12/18/14 0250  12/19/14 0419 12/20/14 0530 12/21/14 0241  NA 138  < > 136 134* 138  K 4.4  < > 4.2 4.9 4.7  CL 98  < > 97 94* 97  CO2 31  < > 25 27 28   GLUCOSE 92  < > 98 86 89  BUN 28*  < > 23 46* 44*  CREATININE 5.79*  < > 4.70* 7.21* 6.93*  CALCIUM 8.0*  < > 8.0* 7.8* 8.5  PHOS 2.6  --   --   --   --   < > = values in this interval not displayed.  Recent Labs Lab 12/18/14 0250  12/18/14 1124  AST  --  41*  ALT  --  25  ALKPHOS  --  141*  BILITOT  --  0.8  PROT  --  6.4  ALBUMIN 3.1* 3.2*    Recent Labs Lab 12/15/14 1112  12/19/14 0419 12/20/14 0530 12/21/14 0241  WBC 1.8*  < > 3.7* 3.9* 3.5*  NEUTROABS 0.6*  --   --   --   --   HGB 7.6*  < > 8.3* 9.2* 8.0*  HCT 24.4*  < > 25.4* 28.4* 25.2*  MCV 101.9*  < > 97.7 97.6 97.7  PLT 122*  < > 105* 140* 121*  < > = values in this interval not displayed. Marland Kitchen allopurinol  100 mg Oral BID  . aspirin EC  81 mg Oral QHS  . cinacalcet  30 mg Oral Q breakfast  . [START ON 12/23/2014] darbepoetin (ARANESP) injection - DIALYSIS  200 mcg Intravenous Q Wed-HD  . feeding  supplement (NEPRO CARB STEADY)  237 mL Oral BID BM  . gabapentin  100 mg Oral q morning - 10a  . gabapentin  200 mg Oral QHS  . heparin  5,000 Units Subcutaneous 3 times per day  . metoprolol tartrate  12.5 mg Oral BID  . midodrine  10 mg Oral Once per day on Sun Tue Thu Sat  . midodrine  10 mg Oral Once per day on Mon Wed Fri  . midodrine  20 mg Oral Once per day on Mon Wed Fri  . midodrine  20 mg Oral Once per day on Sun Tue Thu Sat  . multivitamin  1 tablet Oral QHS  . piperacillin-tazobactam (ZOSYN)  IV  2.25 g Intravenous 3 times per day  . polyethylene glycol  17 g Oral QHS  . rosuvastatin  20 mg Oral q1800  . sevelamer carbonate  800 mg Oral TID WC  . vancomycin  750 mg Intravenous Q M,W,F-HD     sodium chloride, sodium chloride, albuterol, calcium carbonate, colchicine, feeding supplement (NEPRO CARB STEADY), heparin, heparin, lidocaine (PF), lidocaine-prilocaine, ondansetron (ZOFRAN) IV, oxyCODONE-acetaminophen, pentafluoroprop-tetrafluoroeth, sevelamer carbonate

## 2014-12-21 NOTE — Progress Notes (Signed)
Physical Therapy Treatment Patient Details Name: Andrew [name] MRN: [id number] DOB: Apr 09, 1926 Today's Date: 01/06/15    History of Present Illness Pt adm with elevated troponin and low Hgb. Believed to have had demand ischemia. Pt developed fever, cough and lethargy aspiration PNA vs fluid overload. PMH - ESRD on HD, CABG, HTN, myelodysplastic syndrome    PT Comments    Pt with decr medical status and is weaker today. Unable to amb and required 2 person assist for transfer to chair with Stedy.  Follow Up Recommendations  Home health PT;Supervision/Assistance - 24 hour (if family can still provide needed care.)     Equipment Recommendations  None recommended by PT    Recommendations for Other Services       Precautions / Restrictions Precautions Precautions: Fall    Mobility  Bed Mobility Overal bed mobility: Needs Assistance Bed Mobility: Supine to Sit     Supine to sit: Mod assist     General bed mobility comments: Assist to bring legs over and trunk up into sitting.  Transfers Overall transfer level: Needs assistance Equipment used: 4-wheeled walker;Ambulation equipment used Charlaine Dalton) Transfers: Sit to/from [facility] Sit to Stand: +2 physical assistance;Mod assist Stand pivot transfers: +2 physical assistance;Mod assist       General transfer comment: Assist to bring hips up. Initially stood pt with walker but too weak to take steps. Returned to sitting and then stood with [place] and pivoted pt with Stedy.  Ambulation/Gait                 Stairs            Wheelchair Mobility    Modified Rankin (Stroke Patients Only)       Balance Overall balance assessment: Needs assistance Sitting-balance support: Bilateral upper extremity supported;Feet supported Sitting balance-Leahy Scale: Poor Sitting balance - Comments: Min A to maintain static standing. Postural control: Posterior lean                           Cognition Arousal/Alertness: Awake/alert Behavior During Therapy: WFL for tasks assessed/performed Overall Cognitive Status: Within Functional Limits for tasks assessed                      Exercises      General Comments        Pertinent Vitals/Pain Pain Assessment: No/denies pain    Home Living                      Prior Function            PT Goals (current goals can now be found in the care plan section) Progress towards PT goals: Not progressing toward goals - comment (pt with decline in medical status and now is weaker.)    Frequency  Min 3X/week    PT Plan Current plan remains appropriate    Co-evaluation             End of Session Equipment Utilized During Treatment: Gait belt;Oxygen Activity Tolerance: Patient limited by fatigue Patient left: in chair;with call bell/phone within reach;with chair alarm set     Time: 3664-4034 PT Time Calculation (min) (ACUTE ONLY): 18 min  Charges:  $Gait Training: 8-22 mins                    G Codes:      Dreshon Proffit 01-06-15, 11:39 AM  [facility]  PT [phone]

## 2014-12-21 NOTE — Clinical Documentation Improvement (Signed)
  Conflicting documentation, "Type II Demand Ischemia" and "Type II NSTEMI" are documented in the current medical record.  Demand Ischemia codes to "other forms of acute ischemic heart disease" and is usually considered a secondary comorbid condition.  NSTEMI codes to "acute myocardial infarction" and codes according to the circumstances of the admission.  Based on the observations and clinical indicators for this admission, please clarify which diagnosis is the most appropriate.   Thank You, Erling Conte ,RN Clinical Documentation Specialist:  [phone] [facility] [employer] [facility]

## 2014-12-22 ENCOUNTER — Ambulatory Visit: Payer: Medicare Other

## 2014-12-22 ENCOUNTER — Other Ambulatory Visit: Payer: Medicare Other

## 2014-12-22 LAB — BASIC METABOLIC PANEL
ANION GAP: 10 (ref 5–15)
BUN: 37 mg/dL — ABNORMAL HIGH (ref 6–23)
CO2: 29 mmol/L (ref 19–32)
Calcium: 8.1 mg/dL — ABNORMAL LOW (ref 8.4–10.5)
Chloride: 99 mmol/L (ref 96–112)
Creatinine, Ser: 5.71 mg/dL — ABNORMAL HIGH (ref 0.50–1.35)
GFR calc non Af Amer: 8 mL/min — ABNORMAL LOW (ref >90)
GFR, EST AFRICAN AMERICAN: 9 mL/min — AB (ref >90)
Glucose, Bld: 115 mg/dL — ABNORMAL HIGH (ref 70–99)
Potassium: 4.5 mmol/L (ref 3.5–5.1)
SODIUM: 138 mmol/L (ref 135–145)

## 2014-12-22 LAB — CBC
HCT: 22.5 % — ABNORMAL LOW (ref 39.0–52.0)
Hemoglobin: 7.3 g/dL — ABNORMAL LOW (ref 13.0–17.0)
MCH: 31.7 pg (ref 26.0–34.0)
MCHC: 32.4 g/dL (ref 30.0–36.0)
MCV: 97.8 fL (ref 78.0–100.0)
Platelets: 117 10*3/uL — ABNORMAL LOW (ref 150–400)
RBC: 2.3 MIL/uL — ABNORMAL LOW (ref 4.22–5.81)
RDW: 21.5 % — ABNORMAL HIGH (ref 11.5–15.5)
WBC: 3.4 10*3/uL — ABNORMAL LOW (ref 4.0–10.5)

## 2014-12-22 MED ORDER — IPRATROPIUM-ALBUTEROL 0.5-2.5 (3) MG/3ML IN SOLN
3.0000 mL | Freq: Four times a day (QID) | RESPIRATORY_TRACT | Status: DC
  Administered 2014-12-23 – 2014-12-24 (×5): 3 mL via RESPIRATORY_TRACT
  Filled 2014-12-22 (×7): qty 3

## 2014-12-22 MED ORDER — ACETAMINOPHEN 325 MG PO TABS
650.0000 mg | ORAL_TABLET | ORAL | Status: DC | PRN
  Administered 2014-12-23 – 2014-12-24 (×2): 650 mg via ORAL
  Filled 2014-12-22 (×2): qty 2

## 2014-12-22 MED ORDER — SODIUM CHLORIDE 0.9 % IV BOLUS (SEPSIS)
500.0000 mL | Freq: Once | INTRAVENOUS | Status: AC
  Administered 2014-12-22: 500 mL via INTRAVENOUS

## 2014-12-22 NOTE — Progress Notes (Signed)
[facility] PROGRESS NOTE   Andrew [name]:[id number] DOB: 10-06-1926 DOA: 12/17/2014 PCP: [name]., MD  HPI/Subjective: Feels much better seen with SLP at bedside doing follow-up swallow evaluation. Patient is awake, alert and oriented 3. No labored breathing. He will need transfusion with dialysis, await recommendation from nephrology.  Interval history: 81 year old with past medical history of MDS, chronic anemia and ESRD on dialysis. Patient presented to the hospital because of generalized weakness and elevated troponin. Patient seen by cardiology and recommended to keep his hemoglobin above 8.0. Probably his elevated troponin secondary to severe anemia and type II MI secondary to demand ischemia. Patient was doing okay and walking around was about to be discharged then he started to have lethargy and low-grade fever along with cough. Wasn't clear if he is having aspiration pneumonia versus fluid overload. Patient transferred to stepdown on Sunday evening, started on broad-spectrum antibiotics. Currently he is making good progress, blood pressure still in the low side.  Assessment/Plan: Principal Problem:   Elevated troponin Active Problems:   Anemia in neoplastic disease   Peripheral vascular disease with ABI's 07/2012, and L renal artery stenosis s/p PTCA   Cerebrovascular disease   Acute systolic heart failure   End stage renal disease, M/W/F   ESRD on dialysis   Chest pain   Acute decompensated heart failure   Pulmonary hypertension   Acute systolic CHF (congestive heart failure), NYHA class 3    Non-STEMI Patient presented with troponin of 0.5, went up to 2.42, cardiology consulted. Patient is on aspirin, started metoprolol. Started Crestor and check FLP in a.m. This is likely type II non-STEMI from significant anemia. Patient had recent cardiac cath with clean coronaries in December 2015. Cardiology for further recommendation, keep hemoglobin above  8.0. The troponin trending down, per cardiology no further workup.  Aspiration pneumonia Family reported cough with meals, developed fever of 99.9, no leukocytosis. Blood cultures obtained and started on broad-spectrum antibiotics, vancomycin and Zosyn. ABG did not show acidosis, slight PCO2 elevation at 47.6. PO2 is 79. Currently nothing by mouth, obtain SLP evaluation.   Anemia in neoplastic disease Patient has MDS, gets transfusion frequently, presented with hemoglobin of 7.3, dropped to 6.7. Hemoglobin is 7.3, needs transfusion, will await nephrology recommendation as this patient needs transfusion with dialysis.  Dysphagia Patient seen by SLP and recommended dysphagia 2 with neck are thick liquids.  ESRD Nephrology consulted, undergone dialysis already. Patient tells his days are Monday Wednesday and Friday.  Chronic combined diastolic and systolic CHF Last 2-D echocardiogram on 10/24/2014 showed LVEF of 45% in grade 2 diastolic dysfunction. Patient does not appear to be fluid overloaded now after dialysis.  Code Status: Full Code Family Communication: Plan discussed with the patient. Disposition Plan: Remains inpatient Diet: Diet - low sodium heart healthy DIET DYS 2   Consultants:  Cardiology  Procedures:  None  Antibiotics:  None   Objective: Filed Vitals:   12/22/14 0800  BP: 92/31  Pulse: 91  Temp:   Resp: 25    Intake/Output Summary (Last 24 hours) at 12/22/14 1120 Last data filed at 12/22/14 0900  Gross per 24 hour  Intake    250 ml  Output      0 ml  Net    250 ml   Filed Weights   12/20/14 2200 12/21/14 1559 12/21/14 1810  Weight: 70.3 kg (154 lb 15.7 oz) 72.3 kg (159 lb 6.3 oz) 72.3 kg (159 lb 6.3 oz)    Exam: General: Alert and  awake, oriented x3, not in any acute distress. HEENT: anicteric sclera, pupils reactive to light and accommodation, EOMI CVS: S1-S2 clear, no murmur rubs or gallops Chest: clear to auscultation bilaterally,  no wheezing, rales or rhonchi Abdomen: soft nontender, nondistended, normal bowel sounds, no organomegaly Extremities: no cyanosis, clubbing or edema noted bilaterally Neuro: Cranial nerves II-XII intact, no focal neurological deficits  Data Reviewed: Basic Metabolic Panel:  Recent Labs Lab 12/18/14 0250 12/18/14 1124 12/19/14 0419 12/20/14 0530 12/21/14 0241 12/22/14 0334  NA 138 138 136 134* 138 138  K 4.4 3.8 4.2 4.9 4.7 4.5  CL 98 99 97 94* 97 99  CO2 31 32 $Rem'25 27 28 29  'GkXo$ GLUCOSE 92 102* 98 86 89 115*  BUN 28* 9 23 46* 44* 37*  CREATININE 5.79* 2.96* 4.70* 7.21* 6.93* 5.71*  CALCIUM 8.0* 8.3* 8.0* 7.8* 8.5 8.1*  PHOS 2.6  --   --   --   --   --    Liver Function Tests:  Recent Labs Lab 12/18/14 0250 12/18/14 1124  AST  --  41*  ALT  --  25  ALKPHOS  --  141*  BILITOT  --  0.8  PROT  --  6.4  ALBUMIN 3.1* 3.2*   No results for input(s): LIPASE, AMYLASE in the last 168 hours. No results for input(s): AMMONIA in the last 168 hours. CBC:  Recent Labs Lab 12/18/14 1124 12/19/14 0419 12/20/14 0530 12/21/14 0241 12/22/14 0334  WBC 5.7 3.7* 3.9* 3.5* 3.4*  HGB 9.1* 8.3* 9.2* 8.0* 7.3*  HCT 27.7* 25.4* 28.4* 25.2* 22.5*  MCV 95.5 97.7 97.6 97.7 97.8  PLT 106* 105* 140* 121* 117*   Cardiac Enzymes:  Recent Labs Lab 12/17/14 1630 12/17/14 2101 12/18/14 1124  TROPONINI 2.12* 2.42* 1.92*   BNP (last 3 results)  Recent Labs  10/23/14 1117  PROBNP 30772.0*   CBG: No results for input(s): GLUCAP in the last 168 hours.  Micro Recent Results (from the past 240 hour(s))  Wound culture     Status: None (Preliminary result)   Collection Time: 12/20/14  6:20 PM  Result Value Ref Range Status   Specimen Description WOUND LEFT THIGH GRAFT  Final   Special Requests NONE  Final   Gram Stain   Final    NO WBC SEEN FEW SQUAMOUS EPITHELIAL CELLS PRESENT ABUNDANT GRAM POSITIVE COCCI IN PAIRS Performed at [facility]    Culture   Final    ABUNDANT  STAPHYLOCOCCUS AUREUS Note: RIFAMPIN AND GENTAMICIN SHOULD NOT BE USED AS SINGLE DRUGS FOR TREATMENT OF STAPH INFECTIONS. Performed at [facility]    Report Status PENDING  Incomplete  Culture, blood (routine x 2)     Status: None (Preliminary result)   Collection Time: 12/20/14  7:20 PM  Result Value Ref Range Status   Specimen Description BLOOD LEFT HAND  Final   Special Requests BOTTLES DRAWN AEROBIC ONLY [name]  Final   Culture   Final           BLOOD CULTURE RECEIVED NO GROWTH TO DATE CULTURE WILL BE HELD FOR 5 DAYS BEFORE ISSUING A FINAL NEGATIVE REPORT Performed at [facility]    Report Status PENDING  Incomplete  Culture, blood (routine x 2)     Status: None (Preliminary result)   Collection Time: 12/20/14  7:55 PM  Result Value Ref Range Status   Specimen Description BLOOD LEFT THIGH  Final   Special Requests BOTTLES DRAWN AEROBIC AND ANAEROBIC  10 CC EA  Final   Culture   Final           BLOOD CULTURE RECEIVED NO GROWTH TO DATE CULTURE WILL BE HELD FOR 5 DAYS BEFORE ISSUING A FINAL NEGATIVE REPORT Performed at [facility]    Report Status PENDING  Incomplete     Studies: Dg Chest Port 1 View  12/21/2014   CLINICAL DATA:  Aspiration.  EXAM: PORTABLE CHEST - 1 VIEW  COMPARISON:  12/20/2014.  FINDINGS: Mediastinum and hilar structures normal. Prior CABG. Stable cardiomegaly and pulmonary vascular congestion with stable mild interstitial prominence. These findings suggest congestive heart failure. Pneumonitis project in the right lung base cannot be excluded. No pleural effusion or pneumothorax. Right subclavian vascular stents are noted. The stents are in and angled position.  IMPRESSION: 1. Prior CABG. Persistent changes of mild congestive heart failure. Pneumonitis cannot be excluded, particularly in the right lung base. No interim change from prior exam . 2. Right subclavian vascular stents are noted. They are in an angled position.   Electronically  Signed   By: Marcello Moores  Register   On: 12/21/2014 07:32   Dg Chest Port 1 View  12/20/2014   CLINICAL DATA:  Chest pain and shortness of Breath  EXAM: PORTABLE CHEST - 1 VIEW  COMPARISON:  12/17/2014  FINDINGS: The heart size is mildly enlarged. Atherosclerotic calcification noted within the aortic arch. Previous median sternotomy and CABG procedure. Moderate pulmonary vascular congestion is noted. No airspace consolidation.  IMPRESSION: Mild cardiac enlargement and pulmonary vascular congestion   Electronically Signed   By: Kerby Moors M.D.   On: 12/20/2014 11:29   Dg Swallowing Func-speech Pathology  12/21/2014   Lamar Sprinkles, CCC-SLP     12/21/2014  2:51 PM  Objective Swallowing Evaluation: Modified Barium Swallowing Study   Patient Details  Name: Andrew [name] MRN: [id number] Date of Birth: 08-Jun-1926  Today's Date: 12/21/2014 Time: SLP Start Time (ACUTE ONLY): 0135-SLP Stop Time (ACUTE  ONLY): 0200 SLP Time Calculation (min) (ACUTE ONLY): 25 min  Past Medical History:  Past Medical History  Diagnosis Date  . ESRD on hemodialysis     Hemodialysis since 2003; Occluded access in both upper  extremities and the right lower extremity, current access as of  Aug 2014 is L thigh AVG. Gets HD MWF at [facility].     . Duodenal ulcer     remote  . Chronic combined systolic and diastolic CHF (congestive heart  failure)     a. 02/2012 Echo: EF 65%, basal infolat AK, mild conc LVH;  b.  06/2013 Echo: EF 40-45%, mild LVH, Gr2 DD, basal inf HK->AK, Mod  AS, Mild MR, PASP 73;  c. 09/2014 Echo: EF 45% basal-mid inflat  and inf AK, Gr 2DD, mod AS.  Marland Kitchen Arteriosclerotic cardiovascular disease (ASCVD) 1998    a. [id number]; b. 06/2003 Cath: 4/4 patent grafts, EF 55%;  c.  09/2014 Cath: LM 99d, LADnl, LCX nl, RCA 100p, LIMA->LAD nl,  VG->OM2->OM3 nl, VG->RPL nl, EF 40-45%.  . Cerebrovascular disease   . Neuropathy of foot   . Degenerative joint disease   . Gastroesophageal reflux disease   . Impaired hearing      Bilateral; hearing aids relatively ineffective  . Hypertension   . Cholelithiasis 03/2011    Diagnosed incidentally on abdominal ultrasound in 03/2011  . Hepatic steatosis   . Macrocytosis 10/20/2012  . Thrombocytopenia 10/20/2012  . Hyperlipidemia   . Secondary hyperparathyroidism   . Peripheral  vascular disease   . Gout   . Moderate aortic stenosis     a. 06/2013 Echo: Mod AS, mean grad: 25, peak grad: 57;  b.  09/2014 Echo: EF 45%, Gr 2 DD, Mod AS, valve area 1.15cm^2 (VTI),  1.1cm^2 (Vmax).  . Pulmonary hypertension     a. 06/2013 Echo: PASP 61mmHg.  Marland Kitchen Anemia     Prior blood transfusion  . Complication of anesthesia     daughter reports long episodes confusion after amnesia meds  . Renal cell carcinoma 2001    s/p right nephrectomy-2001; subsequent ESRD  . 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 Hypotension   . Pancytopenia   . MRSA infection     a. 08/2014->10/2014 L thigh dialysis graft.  . Infection of AV graft for dialysis     a. Recurrent L thigh infxn s/p revision 10/1, I & D 11/28, and  removal 11/01/2014.  Marland Kitchen Acute decompensated heart failure    Past Surgical History:  Past Surgical History  Procedure Laterality Date  . Right nephrectomy  2001    Renal cell carcinoma  . Colonoscopy  01/24/2011    prominent vascular pattern, suboptimal prep but doable  . Esophagogastroduodenoscopy  01/24/2011    mild erosive reflux esophagitis, bulbar/antral erosions, bx  from antrum benign  . Av fistula repair  2008    revision of anastomosis of Right AVF  . Arteriovenous graft placement  2006    Thrombectomy and interposition jump graft revision to higher  axillary vein of LUA AVG  . Coronary artery bypass graft  1998    X4 VESSELS  . Multiple cysto/ resection tumor's with bx's  LAST ONE  05-09-2011  . Multiple surg's / interventions for avgg/ fistula right upper  arm  LAST REVISION 06-29-2011    ([id number] [place] VEIN/ [id number] DILATATION  ANGIOPLASTY)  . Left ptc left renal artery    .  Cardiac catheterization  2004  . Cataract extraction w/ intraocular lens  implant, bilateral    . Cystoscopy w/ retrogrades  12/28/2011    Procedure: CYSTOSCOPY WITH RETROGRADE PYELOGRAM;  Surgeon: Malka So, MD;  Location: [facility];  Service:  Urology;  Laterality: Left;  C-ARM   . Transurethral resection of bladder tumor  12/28/2011    Procedure: TRANSURETHRAL RESECTION OF BLADDER TUMOR (TURBT);   Surgeon: Malka So, MD;  Location: [facility];   Service: Urology;  Laterality: N/A;  . Av fistula placement  02/29/2012    Procedure: INSERTION OF ARTERIOVENOUS (AV) GORE-TEX GRAFT  THIGH;  Surgeon: Rosetta Posner, MD;  Location: [place];  Service:  Vascular;  Laterality: Right;  Insertion right femoral  arteriovenous gortex graft  . Ligation of right braciocephalic av fistula Right 04-04-2012  . Insertion of dialysis catheter  06/05/2012    Procedure: INSERTION OF DIALYSIS CATHETER;  Surgeon: Mal Misty, MD;  Location: [place];  Service: Vascular;  Laterality:  Left;  Insertion diatek catheter left IJ  . Insertion of dialysis catheter  08/22/2012    Procedure: INSERTION OF DIALYSIS CATHETER;  Surgeon: Serafina Mitchell, MD;  Location: [facility];  Service: Vascular;   Laterality: Left;  insertion of dialysis catheter left  internal  jugular 27cm  . Av fistula placement  10/08/2012    Procedure: INSERTION OF ARTERIOVENOUS (AV) GORE-TEX GRAFT  THIGH;  Surgeon: Angelia Mould, MD;  Location: [place];   Service: Vascular;  Laterality:  Left;  . Cystoscopy with biopsy N/A 08/07/2013    Procedure: CYSTOSCOPY WITH BLADDER BIOPSY;  Surgeon: Malka So, MD;  Location: [facility];  Service: Urology;  Laterality:  N/A;  . Fulguration of bladder tumor N/A 08/07/2013    Procedure: FULGURATION OF BLADDER TUMOR;  Surgeon: Malka So, MD;  Location: [facility];  Service: Urology;  Laterality:  N/A;  . Revision of arteriovenous goretex graft Left 08/27/2014    Procedure: THROMBECTOMY & REVISION OF LEFT  ARM  ARTERIOVENOUS  GORETEX GRAFT;  Surgeon: Angelia Mould, MD;  Location: [place];  Service: Vascular;  Laterality: Left;  . Revision of arteriovenous goretex graft Left 10/01/2014    Procedure: SEGMENT OF Left THIGH ARTERIOVENOUS GORETEX GRAFT  Removed;  Surgeon: Mal Misty, MD;  Location: [place];   Service: Vascular;  Laterality: Left;  . Removal of graft Left 11/01/2014    Procedure: REMOVAL OF INFECTED LEFT THIGH GRAFT;  Surgeon:  Serafina Mitchell, MD;  Location: [facility];  Service: Vascular;   Laterality: Left;  removal of infected left dialysis graft.  . Left heart catheterization with coronary/graft angiogram N/A  10/27/2014    Procedure: LEFT HEART CATHETERIZATION WITH Beatrix Fetters;  Surgeon: Leonie Man, MD;  Location: [facility] C[facility];   Service: Cardiovascular;  Laterality: N/A;  . Tonsillectomy     HPI:  HPI: 81 y/o ? ESRD m/w/f,  H/o CVA, suspect COPD recent admission  11/27/14-11/30/14 Decompensated HF 2/2 to dietary indiscretion  No Data Recorded  Assessment / Plan / Recommendation CHL IP CLINICAL IMPRESSIONS 12/21/2014  Dysphagia Diagnosis (None)  Clinical impression Pt with oral/pharyngeal dysphagia  characterized by delayed oral transit, uncoordinated mastication,  delay in the swallow trigger, valleculae residue and pharyngeal  weakness.  Penetration occured during the swallow impacted by  decreased epiglottic inversion due to sensory and motor deficits.   Chin tuck was not effective to eliminate penetration with thin  liquid boluses from a cup.  Esophageal sweep revealed it mildly  slow to clear.  Rx begin dysphagia 2 diet with nectar thick  liquids.  Pt to be totally upright for all intake and should not  use straws.        CHL IP TREATMENT RECOMMENDATION 12/21/2014  Treatment Plan Recommendations Therapy as outlined in treatment  plan below     CHL IP DIET RECOMMENDATION 12/21/2014  Diet Recommendations Dysphagia 2 (Fine chop);Nectar-thick liquid  Liquid Administration via Cup;No straw   Medication Administration Whole meds with puree  Compensations Slow rate;Small sips/bites;Check for anterior  loss;Follow solids with liquid  Postural Changes and/or Swallow Maneuvers Out of bed for  meals;Seated upright 90 degrees;Upright 30-60 min after meal     CHL IP OTHER RECOMMENDATIONS 12/21/2014  Recommended Consults (None)  Oral Care Recommendations Oral care Q4 per protocol  Other Recommendations Order thickener from pharmacy;Prohibited  food (jello, ice cream, thin soups);Remove water pitcher     CHL IP FOLLOW UP RECOMMENDATIONS 12/21/2014  Follow up Recommendations Home health SLP     CHL IP FREQUENCY AND DURATION 12/21/2014  Speech Therapy Frequency (ACUTE ONLY) min 2x/week  Treatment Duration 2 weeks     Pertinent Vitals/Pain None verbalized    SLP Swallow Goals No flowsheet data found.  No flowsheet data found.    CHL IP REASON FOR REFERRAL 12/21/2014  Reason for Referral Objectively evaluate swallowing function     CHL IP ORAL PHASE 12/21/2014  Lips (None)  Tongue (None)  Mucous  membranes (None)  Nutritional status (None)  Other (None)  Oxygen therapy (None)  Oral Phase Impaired  Oral - Pudding Teaspoon WFL;Delayed oral transit  Oral - Pudding Cup (None)  Oral - Honey Teaspoon (None)  Oral - Honey Cup (None)  Oral - Honey Syringe (None)  Oral - Nectar Teaspoon (None)  Oral - Nectar Cup Right anterior bolus loss  Oral - Nectar Straw (None)  Oral - Nectar Syringe (None)  Oral - Ice Chips (None)  Oral - Thin Teaspoon Right anterior bolus loss  Oral - Thin Cup Right anterior bolus loss  Oral - Thin Straw (None)  Oral - Thin Syringe (None)  Oral - Puree (None)  Oral - Mechanical Soft (None)  Oral - Regular Weak lingual manipulation;Impaired  mastication;Delayed oral transit  Oral - Multi-consistency (None)  Oral - Pill (None)  Oral Phase - Comment Pt with some tongue base residue for purees  that would spill into the vallecula.        CHL IP PHARYNGEAL PHASE 12/21/2014  Pharyngeal Phase Impaired Pharyngeal -  Pudding Teaspoon Delayed  swallow initiation;Reduced epiglottic inversion;Premature  spillage to valleculae;Pharyngeal residue - valleculae  Penetration/Aspiration details (pudding teaspoon) (None)  Pharyngeal - Pudding Cup (None)  Penetration/Aspiration details (pudding cup) (None)  Pharyngeal - Honey Teaspoon (None)  Penetration/Aspiration details (honey teaspoon) (None)  Pharyngeal - Honey Cup (None)  Penetration/Aspiration details (honey cup) (None)  Pharyngeal - Honey Syringe (None)  Penetration/Aspiration details (honey syringe) (None)  Pharyngeal - Nectar Teaspoon (None)  Penetration/Aspiration details (nectar teaspoon) (None)  Pharyngeal - Nectar Cup Delayed swallow initiation;Reduced  epiglottic inversion;Pharyngeal residue - valleculae;Premature  spillage to valleculae  Penetration/Aspiration details (nectar cup) (None)  Pharyngeal - Nectar Straw (None)  Penetration/Aspiration details (nectar straw) (None)  Pharyngeal - Nectar Syringe (None)  Penetration/Aspiration details (nectar syringe) (None)  Pharyngeal - Ice Chips (None)  Penetration/Aspiration details (ice chips) (None)  Pharyngeal - Thin Teaspoon Delayed swallow initiation;Reduced  epiglottic inversion;Pharyngeal residue - valleculae;Premature  spillage to valleculae;Premature spillage to pyriform sinuses  Penetration/Aspiration details (thin teaspoon) (None)  Pharyngeal - Thin Cup Delayed swallow initiation;Premature  spillage to valleculae;Premature spillage to pyriform  sinuses;Reduced epiglottic inversion;Penetration/Aspiration  during swallow;Trace aspiration;Pharyngeal residue - valleculae  Penetration/Aspiration details (thin cup) Material enters airway,  remains ABOVE vocal cords and not ejected out  Pharyngeal - Thin Straw (None)  Penetration/Aspiration details (thin straw) (None)  Pharyngeal - Thin Syringe (None)  Penetration/Aspiration details (thin syringe') (None)  Pharyngeal - Puree Delayed swallow initiation;Premature spillage  to  valleculae;Reduced epiglottic inversion;Pharyngeal residue -  valleculae  Penetration/Aspiration details (puree) (None)  Pharyngeal - Mechanical Soft (None)  Penetration/Aspiration details (mechanical soft) (None)  Pharyngeal - Regular Delayed swallow initiation;Premature  spillage to valleculae;Reduced epiglottic inversion;Pharyngeal  residue - valleculae  Penetration/Aspiration details (regular) (None)  Pharyngeal - Multi-consistency (None)  Penetration/Aspiration details (multi-consistency) (None)  Pharyngeal - Pill (None)  Penetration/Aspiration details (pill) (None)  Pharyngeal Comment (None)     CHL IP CERVICAL ESOPHAGEAL PHASE 12/21/2014  Cervical Esophageal Phase WFL  Pudding Teaspoon (None)  Pudding Cup (None)  Honey Teaspoon (None)  Honey Cup (None)  Honey Syringe (None)  Nectar Teaspoon (None)  Nectar Cup (None)  Nectar Straw (None)  Nectar Syringe (None)  Thin Teaspoon (None)  Thin Cup (None)  Thin Straw (None)  Thin Syringe (None)  Cervical Esophageal Comment (None)    No flowsheet data found.         Fleet Contras 12/21/2014, 2:49 PM  Dimas Aguas, MA, CCC-SLP Acute Rehab  SLP [id number]      Scheduled Meds: . allopurinol  100 mg Oral BID  . antiseptic oral rinse  7 mL Mouth Rinse q12n4p  . aspirin EC  81 mg Oral QHS  . chlorhexidine  15 mL Mouth Rinse BID  . cinacalcet  30 mg Oral Q breakfast  . [START ON 12/23/2014] darbepoetin (ARANESP) injection - DIALYSIS  200 mcg Intravenous Q Wed-HD  . feeding supplement (NEPRO CARB STEADY)  237 mL Oral BID BM  . heparin  5,000 Units Subcutaneous 3 times per day  . metoprolol tartrate  12.5 mg Oral BID  . midodrine  10 mg Oral Once per day on Sun Tue Thu Sat  . midodrine  10 mg Oral Once per day on Mon Wed Fri  . midodrine  20 mg Oral Once per day on Mon Wed Fri  . midodrine  20 mg Oral Once per day on Sun Tue Thu Sat  . multivitamin  1 tablet Oral QHS  . piperacillin-tazobactam (ZOSYN)  IV  2.25 g Intravenous 3 times per day  . polyethylene  glycol  17 g Oral QHS  . rosuvastatin  20 mg Oral q1800  . sevelamer carbonate  800 mg Oral TID WC  . vancomycin  750 mg Intravenous Q M,W,F-HD   Continuous Infusions:      Time spent: 35 minutes    [facility] A  [facility] Pager [phone] If 7PM-7AM, please contact night-coverage at [email or web address], password [facility] 12/22/2014, 11:20 AM  LOS: 5 days

## 2014-12-22 NOTE — Progress Notes (Signed)
Inpatient Rehabilitation  IP Rehab received a call from pt's daughter Andrew [name] requesting to speak with an admissions coordinator about the feasibility of pt. coming for IP rehab when well enough.  I reviewed chart and spoke with the two daughters at the patient's bedside.  They recognize that their dad is currently a sick man and not ready for rehab quite yet.  They do believe he would benefit from "team approach" rehab when he is more medically ready.  I believe he would benefit from rehab consult to determine appropriateness for rehab once he is more medically stable. We are currently recommending IP Rehab consult.  I discussed with Dr. Hartford Poli and he is agreeable and will write order for consult.    Thanks!  Please call if questions.  [place] Admissions Coordinator Cell [phone] Office [phone]

## 2014-12-22 NOTE — Progress Notes (Signed)
Pt SBP 80's MAP 50's. [place] made aware. BP improved with pt movement. Manual pressure taken 108/58, [facility] made aware and is okay with pt. BP. Will continue to monitor pt.

## 2014-12-22 NOTE — Progress Notes (Signed)
Speech Language Pathology Treatment: Dysphagia  Patient Details Name: Andrew [name] MRN: [id number] DOB: 1926-04-17 Today's Date: 12/22/2014 Time: 7096-4383 SLP Time Calculation (min) (ACUTE ONLY): 24 min  Assessment / Plan / Recommendation Clinical Impression  Pt observed with am meal, tolerating Dys 2 diet and nectar thick liquids without signs of aspiration. Pt needs moderate assist for set up with meal and moderate verbal cues to clear diffuse oral residuals with solids. Pt able to form bolus and swallow, then takes liquid bolus to fully clear. Recommend pt continue current diet, discussed precautions with RN. SLP will follow for tolerance. Unsure if pt will need thickened liquids at d/c, education with pt/family may be necessary.    HPI HPI: 81 y/o ? ESRD m/w/f,  H/o CVA, suspect COPD recent admission 11/27/14-11/30/14 Decompensated HF 2/2 to dietary indiscretion   Pertinent Vitals    SLP Plan  Continue with current plan of care    Recommendations Diet recommendations: Dysphagia 2 (fine chop);Nectar-thick liquid Liquids provided via: Cup;Straw Medication Administration: Whole meds with puree Supervision: Patient able to self feed;Staff to assist with self feeding;Intermittent supervision to cue for compensatory strategies Compensations: Slow rate;Small sips/bites;Check for anterior loss;Follow solids with liquid Postural Changes and/or Swallow Maneuvers: Out of bed for meals;Seated upright 90 degrees;Upright 30-60 min after meal              Oral Care Recommendations: Oral care Q4 per protocol Follow up Recommendations: Home health SLP Plan: Continue with current plan of care    GO    [facility], MA CCC-SLP [phone]  Lynann Beaver 12/22/2014, 8:55 AM

## 2014-12-22 NOTE — Progress Notes (Signed)
[facility] Progress Note   Subjective: no net UF yest, BP's down in 80's - 90's  Filed Vitals:   12/22/14 0746 12/22/14 0800 12/22/14 1017 12/22/14 1147  BP: 96/32 92/31  85/22  Pulse: 90 91  89  Temp: 97.6 F (36.4 C)   98.2 F (36.8 C)  TempSrc: Oral   Oral  Resp: 22 25  22   Height:      Weight:      SpO2: 96% 81% 95% 98%   Exam: Up in chair No jvd Chest mostly clear, some scant rales R base RRR 2/6 M at LUSB Abd soft, NTND No LE or UE edema Left thigh AVG +bruit, healing wound lateral aspect, no drainage at this time Neuro awake, responsive  HD: MWF [place] 4h   74.5kg   2K/3.5 Ca Bath    Heparin 1500   L fem AVG Hect 3 ug, Aranesp 200 / wk, Venofer none Lab: Hb 7.7, tfs 37%, phos 3.4, Ca 8.2, alb 3.3        Assessment: 1. Fever -on empiric abx , poss asp PNA 2. Drainage L thigh - culture pending  3. Hx of MRSA AVG infection w partial graft excision late 2015 4. ESRD on HD MWF 5. Anemia d/t CKD/ MDS - requires tranfusion frequently, max esa 6. MBD - cont meds 7. Chest pain on admission / hx CABG - demand ischemia per cards, last cath 11/15 w patent grafts 8. HTN/vol - below dry wt, no vol excess  Plan- HD tomorrow, keep even    Kelly Splinter MD  pager [phone]    cell [phone]  12/22/2014, 12:56 PM     Recent Labs Lab 12/18/14 0250  12/20/14 0530 12/21/14 0241 12/22/14 0334  NA 138  < > 134* 138 138  K 4.4  < > 4.9 4.7 4.5  CL 98  < > 94* 97 99  CO2 31  < > 27 28 29   GLUCOSE 92  < > 86 89 115*  BUN 28*  < > 46* 44* 37*  CREATININE 5.79*  < > 7.21* 6.93* 5.71*  CALCIUM 8.0*  < > 7.8* 8.5 8.1*  PHOS 2.6  --   --   --   --   < > = values in this interval not displayed.  Recent Labs Lab 12/18/14 0250 12/18/14 1124  AST  --  41*  ALT  --  25  ALKPHOS  --  141*  BILITOT  --  0.8  PROT  --  6.4  ALBUMIN 3.1* 3.2*    Recent Labs Lab 12/20/14 0530 12/21/14 0241 12/22/14 0334  WBC 3.9* 3.5* 3.4*  HGB 9.2* 8.0* 7.3*   HCT 28.4* 25.2* 22.5*  MCV 97.6 97.7 97.8  PLT 140* 121* 117*   . allopurinol  100 mg Oral BID  . antiseptic oral rinse  7 mL Mouth Rinse q12n4p  . aspirin EC  81 mg Oral QHS  . chlorhexidine  15 mL Mouth Rinse BID  . cinacalcet  30 mg Oral Q breakfast  . [START ON 12/23/2014] darbepoetin (ARANESP) injection - DIALYSIS  200 mcg Intravenous Q Wed-HD  . feeding supplement (NEPRO CARB STEADY)  237 mL Oral BID BM  . heparin  5,000 Units Subcutaneous 3 times per day  . metoprolol tartrate  12.5 mg Oral BID  . midodrine  10 mg Oral Once per day on Sun Tue Thu Sat  . midodrine  10 mg Oral Once per day on Mon Wed  Fri  . midodrine  20 mg Oral Once per day on Mon Wed Fri  . midodrine  20 mg Oral Once per day on Sun Tue Thu Sat  . multivitamin  1 tablet Oral QHS  . piperacillin-tazobactam (ZOSYN)  IV  2.25 g Intravenous 3 times per day  . polyethylene glycol  17 g Oral QHS  . rosuvastatin  20 mg Oral q1800  . sevelamer carbonate  800 mg Oral TID WC  . vancomycin  750 mg Intravenous Q M,W,F-HD     sodium chloride, sodium chloride, albuterol, calcium carbonate, colchicine, feeding supplement (NEPRO CARB STEADY), heparin, heparin, lidocaine (PF), lidocaine-prilocaine, ondansetron (ZOFRAN) IV, oxyCODONE-acetaminophen, pentafluoroprop-tetrafluoroeth, RESOURCE THICKENUP CLEAR, sevelamer carbonate

## 2014-12-23 ENCOUNTER — Inpatient Hospital Stay (HOSPITAL_COMMUNITY): Payer: Medicare Other

## 2014-12-23 DIAGNOSIS — N186 End stage renal disease: Secondary | ICD-10-CM

## 2014-12-23 DIAGNOSIS — Z992 Dependence on renal dialysis: Secondary | ICD-10-CM

## 2014-12-23 DIAGNOSIS — R5381 Other malaise: Secondary | ICD-10-CM

## 2014-12-23 DIAGNOSIS — I480 Paroxysmal atrial fibrillation: Secondary | ICD-10-CM | POA: Insufficient documentation

## 2014-12-23 LAB — RENAL FUNCTION PANEL
ALBUMIN: 2.6 g/dL — AB (ref 3.5–5.2)
Anion gap: 11 (ref 5–15)
BUN: 56 mg/dL — AB (ref 6–23)
CALCIUM: 7.8 mg/dL — AB (ref 8.4–10.5)
CHLORIDE: 98 mmol/L (ref 96–112)
CO2: 27 mmol/L (ref 19–32)
CREATININE: 7.78 mg/dL — AB (ref 0.50–1.35)
GFR calc non Af Amer: 6 mL/min — ABNORMAL LOW (ref >90)
GFR, EST AFRICAN AMERICAN: 6 mL/min — AB (ref >90)
GLUCOSE: 106 mg/dL — AB (ref 70–99)
PHOSPHORUS: 4.3 mg/dL (ref 2.3–4.6)
POTASSIUM: 4.7 mmol/L (ref 3.5–5.1)
SODIUM: 136 mmol/L (ref 135–145)

## 2014-12-23 LAB — WOUND CULTURE: Gram Stain: NONE SEEN

## 2014-12-23 LAB — PREPARE RBC (CROSSMATCH)

## 2014-12-23 LAB — CBC
HCT: 23.6 % — ABNORMAL LOW (ref 39.0–52.0)
HEMOGLOBIN: 7.7 g/dL — AB (ref 13.0–17.0)
MCH: 31.6 pg (ref 26.0–34.0)
MCHC: 32.6 g/dL (ref 30.0–36.0)
MCV: 96.7 fL (ref 78.0–100.0)
Platelets: 132 10*3/uL — ABNORMAL LOW (ref 150–400)
RBC: 2.44 MIL/uL — ABNORMAL LOW (ref 4.22–5.81)
RDW: 21.7 % — AB (ref 11.5–15.5)
WBC: 5.6 10*3/uL (ref 4.0–10.5)

## 2014-12-23 MED ORDER — LIDOCAINE HCL (PF) 1 % IJ SOLN: 5.0000 mL | INTRAMUSCULAR | Status: DC | PRN

## 2014-12-23 MED ORDER — PENTAFLUOROPROP-TETRAFLUOROETH EX AERO: 1.0000 "application " | INHALATION_SPRAY | CUTANEOUS | Status: DC | PRN

## 2014-12-23 MED ORDER — VANCOMYCIN HCL IN DEXTROSE 750-5 MG/150ML-% IV SOLN
750.0000 mg | INTRAVENOUS | Status: DC
  Administered 2014-12-25: 750 mg via INTRAVENOUS
  Filled 2014-12-23: qty 150

## 2014-12-23 MED ORDER — SODIUM CHLORIDE 0.9 % IV SOLN: 100.0000 mL | INTRAVENOUS | Status: DC | PRN

## 2014-12-23 MED ORDER — VANCOMYCIN HCL 500 MG IV SOLR
500.0000 mg | Freq: Once | INTRAVENOUS | Status: AC
  Administered 2014-12-23: 500 mg via INTRAVENOUS
  Filled 2014-12-23: qty 500

## 2014-12-23 MED ORDER — METOPROLOL TARTRATE 1 MG/ML IV SOLN: 2.5000 mg | Freq: Once | INTRAVENOUS | Status: DC

## 2014-12-23 MED ORDER — DARBEPOETIN ALFA 200 MCG/0.4ML IJ SOSY
PREFILLED_SYRINGE | INTRAMUSCULAR | Status: AC
  Administered 2014-12-23: 200 ug via INTRAVENOUS
  Filled 2014-12-23: qty 0.4

## 2014-12-23 MED ORDER — METOPROLOL TARTRATE 1 MG/ML IV SOLN
INTRAVENOUS | Status: AC
  Filled 2014-12-23: qty 5

## 2014-12-23 MED ORDER — ALTEPLASE 2 MG IJ SOLR
2.0000 mg | Freq: Once | INTRAMUSCULAR | Status: DC | PRN
  Filled 2014-12-23: qty 2

## 2014-12-23 MED ORDER — MIDODRINE HCL 5 MG PO TABS
ORAL_TABLET | ORAL | Status: AC
  Administered 2014-12-23: 20 mg via ORAL
  Filled 2014-12-23: qty 4

## 2014-12-23 MED ORDER — SODIUM CHLORIDE 0.9 % IV SOLN: Freq: Once | INTRAVENOUS | Status: DC

## 2014-12-23 MED ORDER — ALBUMIN HUMAN 25 % IV SOLN
INTRAVENOUS | Status: AC
  Administered 2014-12-23: 25 g via INTRAVENOUS
  Filled 2014-12-23: qty 100

## 2014-12-23 MED ORDER — ALBUMIN HUMAN 25 % IV SOLN
25.0000 g | Freq: Once | INTRAVENOUS | Status: AC
  Administered 2014-12-23: 25 g via INTRAVENOUS

## 2014-12-23 MED ORDER — METOPROLOL TARTRATE 1 MG/ML IV SOLN
2.5000 mg | Freq: Once | INTRAVENOUS | Status: AC
  Administered 2014-12-23: 2.5 mg via INTRAVENOUS

## 2014-12-23 MED ORDER — LIDOCAINE-PRILOCAINE 2.5-2.5 % EX CREA: 1.0000 "application " | TOPICAL_CREAM | CUTANEOUS | Status: DC | PRN

## 2014-12-23 MED ORDER — HEPARIN SODIUM (PORCINE) 1000 UNIT/ML DIALYSIS: 1500.0000 [IU] | Freq: Once | INTRAMUSCULAR | Status: DC

## 2014-12-23 MED ORDER — HEPARIN SODIUM (PORCINE) 1000 UNIT/ML DIALYSIS: 1000.0000 [IU] | INTRAMUSCULAR | Status: DC | PRN

## 2014-12-23 MED ORDER — NEPRO/CARBSTEADY PO LIQD: 237.0000 mL | ORAL | Status: DC | PRN

## 2014-12-23 NOTE — Procedures (Signed)
I was present at this dialysis session, have reviewed the session itself and made  appropriate changes  Patient developed afib w RVR w heart rate 150's, BP dropped into 70's. Patient was asymptomatic initially, we gave 5.0 mg IV lopressor and HR improved into 120's.  EKG showed marked ST depression, pt had some mild chest pain so we terminated dialysis after 2h 15 min approx.  Pt in no distress, lungs clear.   Kelly Splinter MD (pgr) [phone]    [phone] 12/23/2014, 9:51 AM

## 2014-12-23 NOTE — Progress Notes (Signed)
INITIAL NUTRITION ASSESSMENT  DOCUMENTATION CODES Per approved criteria  -Not Applicable   INTERVENTION: -Continue Nepro Shake po BID, each supplement provides 425 kcal and 19 grams protein  NUTRITION DIAGNOSIS: Inadequate oral intake related to dysphagia as evidenced by PO: 5-50%.   Goal: Pt will meet >90% of estimated nutritional needs  Monitor:  PO/supplement intake, labs, weight changes, I/O's  Reason for Assessment: Low braden  81 y.o. male  Admitting Dx: Elevated troponin  81 y/o ? 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 Alvy Bimler Transfusion dependant AoCD/Malignancy, prior infected L thigh AV graft 11/27-12/9/15, CABG 1998-Patent grafts 2004, Cp s/p Cardiac cath 10/27/14=EF40-45% [thought at time was HTN related Angina/demand ischemia], Ho Bladder CA s/p TURB 11/2011, h/o L RAS s/p PTCA hx solaitary kidney, for hypernephroma 2001 H/o CVA, suspect COPD recent admission 11/27/14-11/30/14 Decompensated HF 2/2 to dietary indiscretion  ASSESSMENT: Pt admitted with a-fib with RVR and aspiration pneumonia.  Attempted to see pt multiple times, but was in procedure and in with multiple providers at times of visits.  SLP is following. Pt with dysphagia due to delayed oral transit. Pt has been on a dysphagia 2 diet with nectar thick liquids. SLP questioning needs for thickened liquids on discharge. Intake has been variable 5-50%. Pt is also receiving Nepro supplement BID, which pt is accepting per [facility].  Noted weight variability due to hx of CHF and ESRD on HD. UBW between 160-175#. Recent wt loss likely due to fluid loss. Per chart review, pr with hx of dietary indiscretion. Labs reviewed. BUN/Creat: 56/7.78, Calcium: 7.8, Glucose: 106.   Height: Ht Readings from Last 1 Encounters:  12/17/14 $RemoveB'5\' 8"'oNuYjfpp$  (1.727 m)    Weight: Wt Readings from Last 1 Encounters:  12/23/14 160 lb 15 oz (73 kg)    Ideal Body Weight: 154#  % Ideal Body Weight: 104%  Wt  Readings from Last 10 Encounters:  12/23/14 160 lb 15 oz (73 kg)  11/30/14 171 lb 1.2 oz (77.6 kg)  11/24/14 171 lb (77.565 kg)  11/19/14 160 lb 0.9 oz (72.6 kg)  11/16/14 174 lb (78.926 kg)  11/04/14 175 lb 14.8 oz (79.8 kg)  10/20/14 176 lb 11.2 oz (80.151 kg)  10/20/14 176 lb (79.833 kg)  10/08/14 170 lb (77.111 kg)  10/01/14 173 lb (78.472 kg)    Usual Body Weight: 165#  % Usual Body Weight: 97%  BMI:  Body mass index is 24.48 kg/(m^2). Normal weight range  Estimated Nutritional Needs: Kcal: 2100-2300 Protein: 88-98 grams Fluid: >1.5 L  Skin: prior skin graft on lt thigh  Diet Order: Diet - low sodium heart healthy DIET DYS 2  EDUCATION NEEDS: -Education not appropriate at this time   Intake/Output Summary (Last 24 hours) at 12/23/14 1039 Last data filed at 12/23/14 0958  Gross per 24 hour  Intake    250 ml  Output  -1185 ml  Net   1435 ml    Last BM: 12/21/14  Labs:   Recent Labs Lab 12/18/14 0250  12/21/14 0241 12/22/14 0334 12/23/14 0732  NA 138  < > 138 138 136  K 4.4  < > 4.7 4.5 4.7  CL 98  < > 97 99 98  CO2 31  < > $R'28 29 27  'wy$ BUN 28*  < > 44* 37* 56*  CREATININE 5.79*  < > 6.93* 5.71* 7.78*  CALCIUM 8.0*  < > 8.5 8.1* 7.8*  PHOS 2.6  --   --   --  4.3  GLUCOSE 92  < > 89 115* 106*  < > = values in this interval not displayed.  CBG (last 3)  No results for input(s): GLUCAP in the last 72 hours.  Scheduled Meds: . sodium chloride   Intravenous Once  . allopurinol  100 mg Oral BID  . antiseptic oral rinse  7 mL Mouth Rinse q12n4p  . aspirin EC  81 mg Oral QHS  . chlorhexidine  15 mL Mouth Rinse BID  . cinacalcet  30 mg Oral Q breakfast  . darbepoetin (ARANESP) injection - DIALYSIS  200 mcg Intravenous Q Wed-HD  . feeding supplement (NEPRO CARB STEADY)  237 mL Oral BID BM  . heparin  5,000 Units Subcutaneous 3 times per day  . ipratropium-albuterol  3 mL Nebulization Q6H  . metoprolol      . metoprolol  2.5 mg Intravenous Once  .  metoprolol tartrate  12.5 mg Oral BID  . midodrine  10 mg Oral Once per day on Sun Tue Thu Sat  . midodrine  10 mg Oral Once per day on Mon Wed Fri  . midodrine  20 mg Oral Once per day on Mon Wed Fri  . midodrine  20 mg Oral Once per day on Sun Tue Thu Sat  . multivitamin  1 tablet Oral QHS  . piperacillin-tazobactam (ZOSYN)  IV  2.25 g Intravenous 3 times per day  . polyethylene glycol  17 g Oral QHS  . rosuvastatin  20 mg Oral q1800  . sevelamer carbonate  800 mg Oral TID WC  . vancomycin  500 mg Intravenous Once  . [START ON 12/25/2014] vancomycin  750 mg Intravenous Q M,W,F-HD    Continuous Infusions:   Past Medical History  Diagnosis Date  . ESRD on hemodialysis     Hemodialysis since 2003; Occluded access in both upper extremities and the right lower extremity, current access as of Aug 2014 is L thigh AVG. Gets HD MWF at [facility].     . Duodenal ulcer     remote  . Chronic combined systolic and diastolic CHF (congestive heart failure)     a. 02/2012 Echo: EF 65%, basal infolat AK, mild conc LVH;  b. 06/2013 Echo: EF 40-45%, mild LVH, Gr2 DD, basal inf HK->AK, Mod AS, Mild MR, PASP 73;  c. 09/2014 Echo: EF 45% basal-mid inflat and inf AK, Gr 2DD, mod AS.  Marland Kitchen Arteriosclerotic cardiovascular disease (ASCVD) 1998    a. [id number]; b. 06/2003 Cath: 4/4 patent grafts, EF 55%;  c. 09/2014 Cath: LM 99d, LADnl, LCX nl, RCA 100p, LIMA->LAD nl, VG->OM2->OM3 nl, VG->RPL nl, EF 40-45%.  . Cerebrovascular disease   . Neuropathy of foot   . Degenerative joint disease   . Gastroesophageal reflux disease   . Impaired hearing     Bilateral; hearing aids relatively ineffective  . Hypertension   . Cholelithiasis 03/2011    Diagnosed incidentally on abdominal ultrasound in 03/2011  . Hepatic steatosis   . Macrocytosis 10/20/2012  . Thrombocytopenia 10/20/2012  . Hyperlipidemia   . Secondary hyperparathyroidism   . Peripheral vascular disease   . Gout   . Moderate aortic stenosis      a. 06/2013 Echo: Mod AS, mean grad: 25, peak grad: 57;  b. 09/2014 Echo: EF 45%, Gr 2 DD, Mod AS, valve area 1.15cm^2 (VTI), 1.1cm^2 (Vmax).  . Pulmonary hypertension     a. 06/2013 Echo: PASP 14mmHg.  Marland Kitchen Anemia     Prior blood transfusion  . Complication of  anesthesia     daughter reports long episodes confusion after amnesia meds  . Renal cell carcinoma 2001    s/p right nephrectomy-2001; subsequent ESRD  . 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 Hypotension   . Pancytopenia   . MRSA infection     a. 08/2014->10/2014 L thigh dialysis graft.  . Infection of AV graft for dialysis     a. Recurrent L thigh infxn s/p revision 10/1, I & D 11/28, and removal 11/01/2014.  Marland Kitchen Acute decompensated heart failure     Past Surgical History  Procedure Laterality Date  . Right nephrectomy  2001    Renal cell carcinoma  . Colonoscopy  01/24/2011    prominent vascular pattern, suboptimal prep but doable  . Esophagogastroduodenoscopy  01/24/2011    mild erosive reflux esophagitis, bulbar/antral erosions, bx from antrum benign  . Av fistula repair  2008    revision of anastomosis of Right AVF  . Arteriovenous graft placement  2006    Thrombectomy and interposition jump graft revision to higher axillary vein of LUA AVG  . Coronary artery bypass graft  1998    X4 VESSELS  . Multiple cysto/ resection tumor's with bx's  LAST ONE 05-09-2011  . Multiple surg's / interventions for avgg/ fistula right upper arm  LAST REVISION 06-29-2011    (JUNE 0865 Oliver VEIN/ [phone] DILATATION ANGIOPLASTY)  . Left ptc left renal artery    . Cardiac catheterization  2004  . Cataract extraction w/ intraocular lens  implant, bilateral    . Cystoscopy w/ retrogrades  12/28/2011    Procedure: CYSTOSCOPY WITH RETROGRADE PYELOGRAM;  Surgeon: Malka So, MD;  Location: [facility];  Service: Urology;  Laterality: Left;  C-ARM   . Transurethral resection of bladder tumor   12/28/2011    Procedure: TRANSURETHRAL RESECTION OF BLADDER TUMOR (TURBT);  Surgeon: Malka So, MD;  Location: [facility];  Service: Urology;  Laterality: N/A;  . Av fistula placement  02/29/2012    Procedure: INSERTION OF ARTERIOVENOUS (AV) GORE-TEX GRAFT THIGH;  Surgeon: Rosetta Posner, MD;  Location: [place];  Service: Vascular;  Laterality: Right;  Insertion right femoral arteriovenous gortex graft  . Ligation of right braciocephalic av fistula Right 04-04-2012  . Insertion of dialysis catheter  06/05/2012    Procedure: INSERTION OF DIALYSIS CATHETER;  Surgeon: Mal Misty, MD;  Location: [place];  Service: Vascular;  Laterality: Left;  Insertion diatek catheter left IJ  . Insertion of dialysis catheter  08/22/2012    Procedure: INSERTION OF DIALYSIS CATHETER;  Surgeon: Serafina Mitchell, MD;  Location: [facility];  Service: Vascular;  Laterality: Left;  insertion of dialysis catheter left  internal jugular 27cm  . Av fistula placement  10/08/2012    Procedure: INSERTION OF ARTERIOVENOUS (AV) GORE-TEX GRAFT THIGH;  Surgeon: Angelia Mould, MD;  Location: [place];  Service: Vascular;  Laterality: Left;  . Cystoscopy with biopsy N/A 08/07/2013    Procedure: CYSTOSCOPY WITH BLADDER BIOPSY;  Surgeon: Malka So, MD;  Location: [facility];  Service: Urology;  Laterality: N/A;  . Fulguration of bladder tumor N/A 08/07/2013    Procedure: FULGURATION OF BLADDER TUMOR;  Surgeon: Malka So, MD;  Location: [facility];  Service: Urology;  Laterality: N/A;  . Revision of arteriovenous goretex graft Left 08/27/2014    Procedure: THROMBECTOMY & REVISION OF LEFT ARM  ARTERIOVENOUS GORETEX GRAFT;  Surgeon: Angelia Mould, MD;  Location[facility];  Service: Vascular;  Laterality: Left;  . Revision of arteriovenous goretex graft Left 10/01/2014    Procedure: SEGMENT OF Left THIGH ARTERIOVENOUS GORETEX GRAFT Removed;  Surgeon: Mal Misty, MD;  Location: [place];  Service: Vascular;  Laterality:  Left;  . Removal of graft Left 11/01/2014    Procedure: REMOVAL OF INFECTED LEFT THIGH GRAFT;  Surgeon: Serafina Mitchell, MD;  Location: [place] [facility];  Service: Vascular;  Laterality: Left;  removal of infected left dialysis graft.  . Left heart catheterization with coronary/graft angiogram N/A 10/27/2014    Procedure: LEFT HEART CATHETERIZATION WITH Beatrix Fetters;  Surgeon: Leonie Man, MD;  Location: [facility] CATH LAB;  Service: Cardiovascular;  Laterality: N/A;  . Tonsillectomy      Yolande Skoda A. Jimmye Norman, RD, LDN, CDE Pager: [phone] After hours Pager: [phone]

## 2014-12-23 NOTE — Consult Note (Signed)
CONSULT NOTE  Date: 12/23/2014               Patient Name:  Andrew [name] MRN: [id number]  DOB: 1926-07-11 Age / Sex: 81 y.o., male        PCP: [name] Primary Cardiologist: Harl Bowie             Referring Physician: singh              Reason for Consult: Atrial fib, + Troponin levels.          Patient is very hard of hearing.  Needed new hearing aid batteries  .  It was very difficult for him to hear anything I said today   History of Present Illness: Patient is a 81 y.o. male with a PMHx of ESRD, on hemodialysis, CAD (s/p CABG in 1998. Last cath 09/2014: 99% distal LM; LAD normal LCx normal RCA 100%; LIMA to LAD patent; SVG to OM2/OM3 patent; SVG to PLSA normal LVEF 40 to 45%), anemia, HTN, ESRD, moderate aortic stenosis myelodysplasia, HTN, bladder cancer , COPD, chronic anemia , who was admitted to [facility] on 12/17/2014 for evaluation of dyspnea.  He had had dyspnea for 10 years. Acutely worsened several days prior to [facility] He says that it gets worse every year  + cough 5-6 years , worse over the past 5-6 months .  Acutely worsened several days prior to admission   Hx of MI 5-6 years ago  + chest pain,  Described as a  across his chest .  just a pain, tightness , would last a few minutes and are typically relieved with SL NTG  Worse with walking   Associated signs and symptoms: associated with dizziness and worsening dyspnea.   He requires transfusions on a regular basis.  He was admitted to the hospital 5 days ago with similar presentation. He was seen by Dr. Harrington Challenger and had a full consultation at that time.  Medications: Outpatient medications: Prescriptions prior to admission  Medication Sig Dispense Refill Last Dose  . acetaminophen (TYLENOL) 500 MG tablet Take 500 mg by mouth every 6 (six) hours as needed. For pain/headache   Past Week at Unknown time  . albuterol (PROVENTIL HFA;VENTOLIN HFA) 108 (90 BASE) MCG/ACT inhaler Inhale 2 puffs into the lungs  every 6 (six) hours as needed for wheezing or shortness of breath. 1 Inhaler 0 Past Month at Unknown time  . allopurinol (ZYLOPRIM) 100 MG tablet Take 100 mg by mouth 2 (two) times daily.    12/17/2014 at 0830  . aspirin EC 81 MG tablet Take 81 mg by mouth at bedtime.    12/16/2014 at Unknown time  . B Complex-C-Folic Acid (DIALYVITE TABLET) TABS Take 1 tablet by mouth daily.    12/16/2014 at Unknown time  . calcium carbonate (TUMS - DOSED IN MG ELEMENTAL CALCIUM) 500 MG chewable tablet Chew 1 tablet by mouth daily as needed for heartburn. For heartburn   12/16/2014 at Unknown time  . cetaphil (CETAPHIL) lotion Apply 1 application topically daily.   12/17/2014 at Unknown time  . cinacalcet (SENSIPAR) 30 MG tablet Take 30 mg by mouth daily with breakfast.    12/17/2014 at 0830  . colchicine 0.6 MG tablet Take 0.6 mg by mouth 2 (two) times daily as needed (for gout).   Past Week at Unknown time  . econazole nitrate 1 % cream Apply 1 application topically daily as needed (Athletes foot).   Past Month at Unknown  time  . Emollient (LUBRIDERM SERIOUSLY SENSITIVE) LOTN Apply 1 application topically daily.   12/17/2014 at Unknown time  . gabapentin (NEURONTIN) 100 MG capsule Take 100-200 mg by mouth 2 (two) times daily. Takes 1 in the morning and 2 at night   12/17/2014 at 0830  . midodrine (PROAMATINE) 10 MG tablet Take 10-20 mg by mouth See admin instructions. On Dialysis days (M,W,F), patient takes 2 tabs in the morning and 1 in the evening and on the other days patient takes 1 tablet in the morning and 2 in teh evening   12/17/2014 at 0830  . nitroGLYCERIN (NITROSTAT) 0.4 MG SL tablet Place 1 tablet (0.4 mg total) under the tongue every 5 (five) minutes as needed for chest pain. 90 tablet 3 12/16/2014 at Unknown time  . oxyCODONE-acetaminophen (PERCOCET/ROXICET) 5-325 MG per tablet Take one tablet by mouth every 6 hours as needed for pain 120 tablet 0 Past Week at Unknown time  . polyethylene glycol powder (MIRALAX)  powder Take 17 g by mouth at bedtime.    Past Week at Unknown time  . sevelamer (RENVELA) 800 MG tablet Take 800-1,600 mg by mouth See admin instructions. Take 2 tabs with each meals and 1 tab with snacks   12/17/2014 at 0830    Current medications: Current Facility-Administered Medications  Medication Dose Route Frequency Provider Last Rate Last Dose  . 0.9 %  sodium chloride infusion   Intravenous Once Thurnell Lose, MD      . acetaminophen (TYLENOL) tablet 650 mg  650 mg Oral Q4H PRN Rhetta Mura Schorr, NP      . albuterol (PROVENTIL) (2.5 MG/3ML) 0.083% nebulizer solution 3 mL  3 mL Inhalation Q6H PRN [name], MD   3 mL at 12/22/14 1955  . allopurinol (ZYLOPRIM) tablet 100 mg  100 mg Oral BID [name], MD   100 mg at 12/23/14 1326  . antiseptic oral rinse (CPC / CETYLPYRIDINIUM CHLORIDE 0.05%) solution 7 mL  7 mL Mouth Rinse q12n4p Verlee Monte, MD   7 mL at 12/23/14 1200  . aspirin EC tablet 81 mg  81 mg Oral QHS [name], MD   81 mg at 12/22/14 2251  . calcium carbonate (TUMS - dosed in mg elemental calcium) chewable tablet 200 mg of elemental calcium  1 tablet Oral Daily PRN [name], MD      . chlorhexidine (PERIDEX) 0.12 % solution 15 mL  15 mL Mouth Rinse BID Verlee Monte, MD   15 mL at 12/23/14 1051  . cinacalcet (SENSIPAR) tablet 30 mg  30 mg Oral Q breakfast [name], MD   30 mg at 12/22/14 0825  . colchicine tablet 0.6 mg  0.6 mg Oral BID PRN [name], MD      . Darbepoetin Alfa (ARANESP) injection 200 mcg  200 mcg Intravenous Q Wed-HD Foye Clock, PA-C   200 mcg at 12/23/14 1010  . feeding supplement (NEPRO CARB STEADY) liquid 237 mL  237 mL Oral BID BM Myriam Jacobson, PA-C   237 mL at 12/23/14 1327  . heparin injection 5,000 Units  5,000 Units Subcutaneous 3 times per day [name], MD   5,000 Units at 12/23/14 1326  . ipratropium-albuterol (DUONEB) 0.5-2.5 (3) MG/3ML nebulizer solution 3 mL  3  mL Nebulization Q6H Verlee Monte, MD   3 mL at 12/23/14 1403  . metoprolol (LOPRESSOR) 1 MG/ML injection           . metoprolol (LOPRESSOR) injection 2.5 mg  2.5 mg Intravenous Once Sol Blazing, MD      . metoprolol tartrate (LOPRESSOR) tablet 12.5 mg  12.5 mg Oral BID Verlee Monte, MD   12.5 mg at 12/21/14 2125  . midodrine (PROAMATINE) tablet 10 mg  10 mg Oral Once per day on Sun Tue Thu Sat [name], MD   10 mg at 12/22/14 0825  . midodrine (PROAMATINE) tablet 10 mg  10 mg Oral Once per day on Mon Wed Fri Kelvin Cellar, MD   10 mg at 12/21/14 1546  . midodrine (PROAMATINE) tablet 20 mg  20 mg Oral Once per day on Mon Wed Fri [name], MD   20 mg at 12/23/14 0825  . midodrine (PROAMATINE) tablet 20 mg  20 mg Oral Once per day on Sun Tue Thu Sat [name], MD   20 mg at 12/22/14 1755  . multivitamin (RENA-VIT) tablet 1 tablet  1 tablet Oral QHS Foye Clock, PA-C   1 tablet at 12/22/14 2246  . ondansetron (ZOFRAN) injection 4 mg  4 mg Intravenous Q6H PRN Ritta Slot, NP      . oxyCODONE-acetaminophen (PERCOCET/ROXICET) 5-325 MG per tablet 1 tablet  1 tablet Oral Q4H PRN [name], MD   1 tablet at 12/23/14 0432  . piperacillin-tazobactam (ZOSYN) IVPB 2.25 g  2.25 g Intravenous 3 times per day Georgina Peer, RPH   2.25 g at 12/22/14 2247  . polyethylene glycol (MIRALAX / GLYCOLAX) packet 17 g  17 g Oral QHS Kelvin Cellar, MD   17 g at 12/19/14 2212  . [place]   Oral PRN Verlee Monte, MD      . rosuvastatin (CRESTOR) tablet 20 mg  20 mg Oral q1800 Verlee Monte, MD   20 mg at 12/22/14 1752  . sevelamer carbonate (RENVELA) tablet 800 mg  800 mg Oral BID PRN [name], MD      . sevelamer carbonate (RENVELA) tablet 800 mg  800 mg Oral TID WC Kelvin Cellar, MD   800 mg at 12/23/14 1326  . [START ON 12/25/2014] vancomycin (VANCOCIN) IVPB 750 mg/150 ml premix  750 mg Intravenous Q M,W,F-HD Georgina Peer, [facility]        Facility-Administered Medications Ordered in Other Encounters  Medication Dose Route Frequency Provider Last Rate Last Dose  . methylPREDNISolone sodium succinate (SOLU-MEDROL) 125 mg/2 mL injection 60 mg  60 mg Intravenous Once Sol Blazing, MD         No Known Allergies   Past Medical History  Diagnosis Date  . ESRD on hemodialysis     Hemodialysis since 2003; Occluded access in both upper extremities and the right lower extremity, current access as of Aug 2014 is L thigh AVG. Gets HD MWF at [facility].     . Duodenal ulcer     remote  . Chronic combined systolic and diastolic CHF (congestive heart failure)     a. 02/2012 Echo: EF 65%, basal infolat AK, mild conc LVH;  b. 06/2013 Echo: EF 40-45%, mild LVH, Gr2 DD, basal inf HK->AK, Mod AS, Mild MR, PASP 73;  c. 09/2014 Echo: EF 45% basal-mid inflat and inf AK, Gr 2DD, mod AS.  Marland Kitchen Arteriosclerotic cardiovascular disease (ASCVD) 1998    a. [id number]; b. 06/2003 Cath: 4/4 patent grafts, EF 55%;  c. 09/2014 Cath: LM 99d, LADnl, LCX nl, RCA 100p, LIMA->LAD nl, VG->OM2->OM3 nl, VG->RPL nl, EF 40-45%.  . Cerebrovascular disease   . Neuropathy of foot   .  Degenerative joint disease   . Gastroesophageal reflux disease   . Impaired hearing     Bilateral; hearing aids relatively ineffective  . Hypertension   . Cholelithiasis 03/2011    Diagnosed incidentally on abdominal ultrasound in 03/2011  . Hepatic steatosis   . Macrocytosis 10/20/2012  . Thrombocytopenia 10/20/2012  . Hyperlipidemia   . Secondary hyperparathyroidism   . Peripheral vascular disease   . Gout   . Moderate aortic stenosis     a. 06/2013 Echo: Mod AS, mean grad: 25, peak grad: 57;  b. 09/2014 Echo: EF 45%, Gr 2 DD, Mod AS, valve area 1.15cm^2 (VTI), 1.1cm^2 (Vmax).  . Pulmonary hypertension     a. 06/2013 Echo: PASP 57mmHg.  Marland Kitchen Anemia     Prior blood transfusion  . Complication of anesthesia     daughter reports long episodes confusion after amnesia meds  .  Renal cell carcinoma 2001    s/p right nephrectomy-2001; subsequent ESRD  . 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 Hypotension   . Pancytopenia   . MRSA infection     a. 08/2014->10/2014 L thigh dialysis graft.  . Infection of AV graft for dialysis     a. Recurrent L thigh infxn s/p revision 10/1, I & D 11/28, and removal 11/01/2014.  Marland Kitchen Acute decompensated heart failure     Past Surgical History  Procedure Laterality Date  . Right nephrectomy  2001    Renal cell carcinoma  . Colonoscopy  01/24/2011    prominent vascular pattern, suboptimal prep but doable  . Esophagogastroduodenoscopy  01/24/2011    mild erosive reflux esophagitis, bulbar/antral erosions, bx from antrum benign  . Av fistula repair  2008    revision of anastomosis of Right AVF  . Arteriovenous graft placement  2006    Thrombectomy and interposition jump graft revision to higher axillary vein of LUA AVG  . Coronary artery bypass graft  1998    X4 VESSELS  . Multiple cysto/ resection tumor's with bx's  LAST ONE 05-09-2011  . Multiple surg's / interventions for avgg/ fistula right upper arm  LAST REVISION 06-29-2011    (JUNE [id number] [place] VEIN/ [id number] DILATATION ANGIOPLASTY)  . Left ptc left renal artery    . Cardiac catheterization  2004  . Cataract extraction w/ intraocular lens  implant, bilateral    . Cystoscopy w/ retrogrades  12/28/2011    Procedure: CYSTOSCOPY WITH RETROGRADE PYELOGRAM;  Surgeon: Malka So, MD;  Location: [facility];  Service: Urology;  Laterality: Left;  C-ARM   . Transurethral resection of bladder tumor  12/28/2011    Procedure: TRANSURETHRAL RESECTION OF BLADDER TUMOR (TURBT);  Surgeon: Malka So, MD;  Location: [facility];  Service: Urology;  Laterality: N/A;  . Av fistula placement  02/29/2012    Procedure: INSERTION OF ARTERIOVENOUS (AV) GORE-TEX GRAFT THIGH;  Surgeon: Rosetta Posner, MD;  Location: [place];  Service:  Vascular;  Laterality: Right;  Insertion right femoral arteriovenous gortex graft  . Ligation of right braciocephalic av fistula Right 04-04-2012  . Insertion of dialysis catheter  06/05/2012    Procedure: INSERTION OF DIALYSIS CATHETER;  Surgeon: Mal Misty, MD;  Location: [place];  Service: Vascular;  Laterality: Left;  Insertion diatek catheter left IJ  . Insertion of dialysis catheter  08/22/2012    Procedure: INSERTION OF DIALYSIS CATHETER;  Surgeon: Serafina Mitchell, MD;  Location: [facility];  Service: Vascular;  Laterality: Left;  insertion of dialysis catheter left  internal jugular 27cm  . Av fistula placement  10/08/2012    Procedure: INSERTION OF ARTERIOVENOUS (AV) GORE-TEX GRAFT THIGH;  Surgeon: Angelia Mould, MD;  Location: [place];  Service: Vascular;  Laterality: Left;  . Cystoscopy with biopsy N/A 08/07/2013    Procedure: CYSTOSCOPY WITH BLADDER BIOPSY;  Surgeon: Malka So, MD;  Location: [facility];  Service: Urology;  Laterality: N/A;  . Fulguration of bladder tumor N/A 08/07/2013    Procedure: FULGURATION OF BLADDER TUMOR;  Surgeon: Malka So, MD;  Location: [facility];  Service: Urology;  Laterality: N/A;  . Revision of arteriovenous goretex graft Left 08/27/2014    Procedure: THROMBECTOMY & REVISION OF LEFT ARM  ARTERIOVENOUS GORETEX GRAFT;  Surgeon: Angelia Mould, MD;  Location: [place];  Service: Vascular;  Laterality: Left;  . Revision of arteriovenous goretex graft Left 10/01/2014    Procedure: SEGMENT OF Left THIGH ARTERIOVENOUS GORETEX GRAFT Removed;  Surgeon: Mal Misty, MD;  Location: [place];  Service: Vascular;  Laterality: Left;  . Removal of graft Left 11/01/2014    Procedure: REMOVAL OF INFECTED LEFT THIGH GRAFT;  Surgeon: Serafina Mitchell, MD;  Location: [place] [facility];  Service: Vascular;  Laterality: Left;  removal of infected left dialysis graft.  . Left heart catheterization with coronary/graft angiogram N/A 10/27/2014    Procedure: LEFT HEART CATHETERIZATION  WITH Beatrix Fetters;  Surgeon: Leonie Man, MD;  Location: [facility] C[facility];  Service: Cardiovascular;  Laterality: N/A;  . Tonsillectomy      Family History  Problem Relation Age of Onset  . Colon cancer Neg Hx   . Liver disease Neg Hx   . Anesthesia problems Neg Hx   . Heart disease Father   . Other Mother     died @ 8 with respiratory issues following hip fx  . Diabetes Mother     Social History:  reports that he quit smoking about 30 years ago. His smoking use included Cigarettes. He has a 25 pack-year smoking history. He has quit using smokeless tobacco. He reports that he drinks alcohol. He reports that he does not use illicit drugs.   Review of Systems: Constitutional:  denies fever, chills, diaphoresis, appetite change and fatigue.  HEENT: denies photophobia, eye pain, redness, hearing loss, ear pain, congestion, sore throat, rhinorrhea, sneezing, neck pain, neck stiffness and tinnitus.  Respiratory: admits to SOB, DOE, cough, chest tightness, and wheezing.  Cardiovascular: admits to chest pain, palpitations and leg swelling.  Gastrointestinal: denies nausea, vomiting, abdominal pain, diarrhea, constipation, blood in stool.  Genitourinary: denies dysuria, urgency, frequency, hematuria, flank pain and difficulty urinating.  Musculoskeletal: denies  myalgias, back pain, joint swelling, arthralgias and gait problem.   Skin: denies pallor, rash and wound.  Neurological: denies dizziness, seizures, syncope, weakness, light-headedness, numbness and headaches.   Hematological: denies adenopathy, easy bruising, personal or family bleeding history.  Psychiatric/ Behavioral: denies suicidal ideation, mood changes, confusion, nervousness, sleep disturbance and agitation.    Physical Exam: BP 104/38 mmHg  Pulse 100  Temp(Src) 97.8 F (36.6 C) (Oral)  Resp 24  Ht $R'5\' 8"'iT$  (1.727 m)  Wt 160 lb 15 oz (73 kg)  BMI 24.48 kg/m2  SpO2 100%  Wt Readings from Last 3 Encounters:    12/23/14 160 lb 15 oz (73 kg)  11/30/14 171 lb 1.2 oz (77.6 kg)  11/24/14 171 lb (77.565 kg)    General: Vital signs reviewed and noted. Chronically ill appearing man  Head: Normocephalic, atraumatic, sclera anicteric,   Neck: Supple. Negative for carotid bruits. No JVD   Lungs:  Bilateral wheezes, rhonchi,    Heart: RRR with S1 S2.    Abdomen/ GI :  Soft, non-tender, non-distended with normoactive bowel sounds. No hepatomegaly. No rebound/guarding. No obvious abdominal masses   MSK: Strength and the appear normal for age.   Extremities: No clubbing or cyanosis. No edema.  Distal pedal pulses are 2+ and equal   Neurologic:  CN are grossly intact except for hearing ,  No obvious motor or sensory defect.  Alert and oriented X 3. Moves all extremities spontaneously.  Psych: Responds to questions appropriately with a normal affect.     Lab results: Basic Metabolic Panel:  Recent Labs Lab 12/18/14 0250  12/21/14 0241 12/22/14 0334 12/23/14 0732  NA 138  < > 138 138 136  K 4.4  < > 4.7 4.5 4.7  CL 98  < > 97 99 98  CO2 31  < > $R'28 29 27  'Rv$ GLUCOSE 92  < > 89 115* 106*  BUN 28*  < > 44* 37* 56*  CREATININE 5.79*  < > 6.93* 5.71* 7.78*  CALCIUM 8.0*  < > 8.5 8.1* 7.8*  PHOS 2.6  --   --   --  4.3  < > = values in this interval not displayed.  Liver Function Tests:  Recent Labs Lab 12/18/14 0250 12/18/14 1124 12/23/14 0732  AST  --  41*  --   ALT  --  25  --   ALKPHOS  --  141*  --   BILITOT  --  0.8  --   PROT  --  6.4  --   ALBUMIN 3.1* 3.2* 2.6*   No results for input(s): LIPASE, AMYLASE in the last 168 hours. No results for input(s): AMMONIA in the last 168 hours.  CBC:  Recent Labs Lab 12/19/14 0419 12/20/14 0530 12/21/14 0241 12/22/14 0334 12/23/14 0732  WBC 3.7* 3.9* 3.5* 3.4* 5.6  HGB 8.3* 9.2* 8.0* 7.3* 7.7*  HCT 25.4* 28.4* 25.2* 22.5* 23.6*  MCV 97.7 97.6 97.7 97.8 96.7  PLT 105* 140* 121* 117* 132*    Cardiac Enzymes:  Recent Labs Lab  12/17/14 1630 12/17/14 2101 12/18/14 1124  TROPONINI 2.12* 2.42* 1.92*    BNP: Invalid input(s): POCBNP  CBG: No results for input(s): GLUCAP in the last 168 hours.  Coagulation Studies: No results for input(s): LABPROT, INR in the last 72 hours.   Other results:  Personal review of EKG shows : atrial fib with RVR.  Tele currently shows NSR at 93 ( converted after transfusion)    Imaging: Dg Chest Port 1 View  12/23/2014   CLINICAL DATA:  Shortness of breath.  EXAM: PORTABLE CHEST - 1 VIEW  COMPARISON:  December 21, 2014.  FINDINGS: Stable cardiomediastinal silhouette. Status post coronary artery bypass graft. No pneumothorax or pleural effusion is noted. No acute pulmonary disease is noted. Bony thorax appears intact.  IMPRESSION: No acute cardiopulmonary abnormality seen.   Electronically Signed   By: Sabino Dick M.D.   On: 12/23/2014 12:11    I have reviewed echo from Nov. 28, 2015  And agree with findings of mild LV dysfunction - ef 45%   Assessment & Plan:  1. Atrial fib with RVR:  He has had paroxysmal atrial fib .  He has already converted back to sinus rhythym.  His rate is well controlled at this point.  I have talked  with Dr. Candiss Norse.  I agree with his plans for low dose metoprolol 12. 5 BID.   He has hypotension and requires midodrine on occasion.  Given his myelodysplastic syndrome, I do not think he is a candidate for anticoagulation.  2  Elevated troponin .   He had a cath in Dec. , 2015.  See previous consult note by Dr. Harrington Challenger on Jan. 21, 2016,.  All his grafts were patent.  He has diffuse small vessel disease.   I suspect this mild troponin elevation is due to demand ischemia.  He is not having any pain at this time.  3. Aortic stenosis:  Mild , no further evaluation needed.   4. Chronic systolic CHF:  EF 45 %    Thayer Headings, Brooke Bonito., MD, [facility] 12/23/2014, 2:55 PM Office - [phone] Pager [phone]

## 2014-12-23 NOTE — Progress Notes (Signed)
Physical Therapy Treatment Patient Details Name: Andrew [name] MRN: [id number] DOB: 08-19-1926 Today's Date: 12/23/2014    History of Present Illness 81 y.o. male admitted to [facility] on 12/17/14 with elevated triponin and low Hgb.  He is believed to have demand ischemia.  PMH - ESRD on HD, CABG, HTN, myelodysplastic syndrome. Also dx with aspiration PNA on dysphasia 2 diet, anemia (recieved 4 units PRBCs this admission), ESRD- on HD M, W, Fri, and new onset a-fib with RVR.      PT Comments    Pt is not progressing as quickly as anticipated and continues to need two people to get up just OOB to chair.  He has extremely poor activity tolerance and would benefit from extensive therapy prior to returning home even if family able to provide 24/7 care.  Recommending SNF placement for rehab at this time. PT to continue to follow acutely.  Follow Up Recommendations  SNF     Equipment Recommendations  None recommended by PT    Recommendations for Other Services   NA     Precautions / Restrictions Precautions Precautions: Fall Precaution Comments: very weak    Mobility  Bed Mobility Overal bed mobility: Needs Assistance Bed Mobility: Supine to Sit     Supine to sit: Mod assist     General bed mobility comments: Mod assist to support trunk during transition to get to sitting.  Assist also needed to transition bil legs to EOB.  Pt with tendancy towards posterior lean until he gets his feet on the floor.   Transfers Overall transfer level: Needs assistance Equipment used: 2 person hand held assist Transfers: Sit to/from [facility] Sit to Stand: +2 physical assistance;Mod assist Stand pivot transfers: +2 physical assistance;Mod assist       General transfer comment: Two person mod assist to stand and pivot to the [facility] x1. used the steady standing frame to get from [facility] to recliner chair (much safer for the pt and allowed therapist and tech to clean him total assist after  (+) BM on BSC.  Pt stands over flexed knees and was unable to take any pivotal steps with his feet.  Therapist had to manually pivot him to [facility].   Ambulation/Gait             General Gait Details: unable at this time.        Balance Overall balance assessment: Needs assistance Sitting-balance support: Feet supported;Bilateral upper extremity supported Sitting balance-Leahy Scale: Poor Sitting balance - Comments: need min assist with feet supported EOB to prevent posterior LOB.  Postural control: Posterior lean Standing balance support: Bilateral upper extremity supported Standing balance-Leahy Scale: Poor Standing balance comment: two person mod assist to stand.                      Cognition Arousal/Alertness: Awake/alert Behavior During Therapy: WFL for tasks assessed/performed Overall Cognitive Status:  (not specifically assessed)                         General Comments General comments (skin integrity, edema, etc.): Pt gets very DOE with any mobility (even just sitting up EOB).  O2 removed due to short O2 tube line when transferring to [facility] and O2 sats on RA decreased to 87%, however, with seated rest and encouragement of PLB he was able to get stats on RA back up to 90.  DOE during mobility 3-4/4.  O2 via  returned to  nose and pt sating in the mid to upper 90s once seated and resting.  HR in the low 100s at rest and max observed during mobility was 116 bpm sinus tachy.       Pertinent Vitals/Pain Pain Assessment: No/denies pain           PT Goals (current goals can now be found in the care plan section) Acute Rehab PT Goals Patient Stated Goal: return home Progress towards PT goals: Progressing toward goals (very slowly)    Frequency  Min 2X/week    PT Plan Discharge plan needs to be updated       End of Session Equipment Utilized During Treatment: Gait belt;Oxygen Activity Tolerance: Patient limited by fatigue (limited by DOE) Patient  left: in chair;with call bell/phone within reach;with chair alarm set;with family/visitor present     Time: 1435-1459 PT Time Calculation (min) (ACUTE ONLY): 24 min  Charges:  $Therapeutic Activity: 23-37 mins            Neave Lenger B. Pritchett, [place], DPT [phone]   12/23/2014, 5:10 PM

## 2014-12-23 NOTE — Progress Notes (Signed)
[facility] Progress Note   Subjective: afib RVR on HD, see HD note  Filed Vitals:   12/23/14 0848 12/23/14 0909 12/23/14 0924 12/23/14 0941  BP: 99/49 71/44 85/37  75/36  Pulse: 137 151 144 126  Temp:      TempSrc:      Resp: 28 30 32 32  Height:      Weight:      SpO2:       Exam: Up in chair No jvd Chest mostly clear, some scant rales R base RRR 2/6 M at LUSB Abd soft, NTND No LE or UE edema Left thigh AVG +bruit, healing wound lateral aspect, no drainage at this time Neuro awake, responsive  HD: MWF [place] 4h   74.5kg   2K/3.5 Ca Bath    Heparin 1500   L fem AVG Hect 3 ug, Aranesp 200 / wk, Venofer none Lab: Hb 7.7, tfs 37%, phos 3.4, Ca 8.2, alb 3.3        Assessment: 1. Asp PNA - on vanc/ zosyn 2. Afib with RVR - did not tolerate full HD due to dropping BP, EKG changes and mild CP 3. ESRD on HD MWF 4. Anemia d/t CKD/ MDS - requires tranfusion frequently, max esa, to get one unit prbc later today 5. MBD - cont meds 6. Chest pain on admission / hx CABG - demand ischemia per cards, last cath 11/15 w patent grafts 7. HTN/vol - below dry wt, no vol excess 8. 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 Wound drainage L thigh - small amount, cx + MRSA, is on Vanc for #1 , will need to get VVS input  Plan- HD today    Kelly Splinter MD  pager [phone]    cell [phone]  12/23/2014, 9:53 AM     Recent Labs Lab 12/18/14 0250  12/21/14 0241 12/22/14 0334 12/23/14 0732  NA 138  < > 138 138 136  K 4.4  < > 4.7 4.5 4.7  CL 98  < > 97 99 98  CO2 31  < > 28 29 27   GLUCOSE 92  < > 89 115* 106*  BUN 28*  < > 44* 37* 56*  CREATININE 5.79*  < > 6.93* 5.71* 7.78*  CALCIUM 8.0*  < > 8.5 8.1* 7.8*  PHOS 2.6  --   --   --  4.3  < > = values in this interval not displayed.  Recent Labs Lab 12/18/14 0250 12/18/14 1124 12/23/14 0732  AST  --  41*  --   ALT  --  25  --   ALKPHOS  --  141*  --   BILITOT  --  0.8  --   PROT   --  6.4  --   ALBUMIN 3.1* 3.2* 2.6*    Recent Labs Lab 12/21/14 0241 12/22/14 0334 12/23/14 0732  WBC 3.5* 3.4* 5.6  HGB 8.0* 7.3* 7.7*  HCT 25.2* 22.5* 23.6*  MCV 97.7 97.8 96.7  PLT 121* 117* 132*   . sodium chloride   Intravenous Once  . allopurinol  100 mg Oral BID  . antiseptic oral rinse  7 mL Mouth Rinse q12n4p  . aspirin EC  81 mg Oral QHS  . chlorhexidine  15 mL Mouth Rinse BID  . cinacalcet  30 mg Oral Q breakfast  . Darbepoetin Alfa      . darbepoetin (ARANESP) injection - DIALYSIS  200 mcg Intravenous Q Wed-HD  . feeding supplement (  NEPRO CARB STEADY)  237 mL Oral BID BM  . heparin  5,000 Units Subcutaneous 3 times per day  . ipratropium-albuterol  3 mL Nebulization Q6H  . metoprolol      . metoprolol  2.5 mg Intravenous Once  . metoprolol tartrate  12.5 mg Oral BID  . midodrine  10 mg Oral Once per day on Sun Tue Thu Sat  . midodrine  10 mg Oral Once per day on Mon Wed Fri  . midodrine  20 mg Oral Once per day on Mon Wed Fri  . midodrine  20 mg Oral Once per day on Sun Tue Thu Sat  . multivitamin  1 tablet Oral QHS  . piperacillin-tazobactam (ZOSYN)  IV  2.25 g Intravenous 3 times per day  . polyethylene glycol  17 g Oral QHS  . rosuvastatin  20 mg Oral q1800  . sevelamer carbonate  800 mg Oral TID WC  . vancomycin  750 mg Intravenous Q M,W,F-HD     sodium chloride, acetaminophen, albuterol, alteplase, calcium carbonate, colchicine, feeding supplement (NEPRO CARB STEADY), heparin, heparin, lidocaine (PF), lidocaine-prilocaine, ondansetron (ZOFRAN) IV, oxyCODONE-acetaminophen, pentafluoroprop-tetrafluoroeth, RESOURCE THICKENUP CLEAR, sevelamer carbonate

## 2014-12-23 NOTE — Progress Notes (Addendum)
Patient Demographics  Andrew [name], is a 81 y.o. male, DOB - 18-Feb-1926, LPF:[id number]  Admit date - 12/17/2014   Admitting Physician [name], MD  Outpatient Primary MD for the patient is [name]., MD  LOS - 6   Chief Complaint  Patient presents with  . Chest Pain        Subjective:   [name] today has, No headache, No chest pain, No abdominal pain - No Nausea, No new weakness tingling or numbness, No Cough - SOB.    Assessment & Plan     Elevated troponin due to demand ischemia from severe anemia. Patient presented with troponin of 0.5, went up to 2.42, cardiology consulted. Currently on aspirin, beta blocker and statin for secondary prevention. Transfusion to keep hemoglobin above 8. Cards following. No further workup. Chest pain-free    Aspiration pneumonia HCAP He is on dysphagia 2 diet, speech following. Empiric IV vancomycin and Zosyn. Monitor clinically. Follow cultures.   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 Is getting 2 more on 12/23/2014 during dialysis.   Dysphagia Patient seen by SLP and recommended dysphagia 2 with neck are thick liquids.   ESRD Nephrology consulted, undergone dialysis already. Patient tells his days are Monday Wednesday and Friday.   Chronic combined diastolic and systolic CHF EF 53%. Compensated no acute issue from this standpoint..   New Onset Afib RVR   Mali VAsc 2 score 3 - Cards consulted.   L thigh wound  MRSA W culture, on Vanco, [facility] called.      Code Status: Full  Family Communication: None present  Disposition Plan: To be decided   Procedures     Consults    Cardiology, renal,  [facility]  Medications  Scheduled Meds: . sodium chloride   Intravenous Once  . allopurinol  100 mg Oral BID  . antiseptic oral rinse  7 mL Mouth Rinse q12n4p  . aspirin EC  81 mg Oral QHS  . chlorhexidine  15 mL Mouth Rinse BID  . cinacalcet  30 mg Oral Q breakfast  . darbepoetin (ARANESP) injection - DIALYSIS  200 mcg Intravenous Q Wed-HD  . feeding supplement (NEPRO CARB STEADY)  237 mL Oral BID BM  . heparin  5,000 Units Subcutaneous 3 times per day  . ipratropium-albuterol  3 mL Nebulization Q6H  . metoprolol      . metoprolol  2.5 mg Intravenous Once  . metoprolol tartrate  12.5 mg Oral BID  . midodrine  10 mg Oral Once per day on Sun Tue Thu Sat  . midodrine  10 mg Oral Once per day on Mon Wed Fri  . midodrine  20 mg Oral Once per day on Mon Wed Fri  . midodrine  20 mg Oral Once per day on Sun Tue Thu Sat  . multivitamin  1 tablet Oral QHS  . piperacillin-tazobactam (ZOSYN)  IV  2.25 g Intravenous 3 times per day  . polyethylene glycol  17 g Oral QHS  . rosuvastatin  20 mg Oral q1800  . sevelamer carbonate  800 mg Oral TID WC  . vancomycin  500 mg Intravenous Once  . [START ON 12/25/2014] vancomycin  750 mg Intravenous Q M,W,F-HD   Continuous Infusions:  PRN Meds:.acetaminophen, albuterol, calcium carbonate, colchicine, ondansetron (ZOFRAN) IV, oxyCODONE-acetaminophen, RESOURCE THICKENUP CLEAR, sevelamer carbonate  DVT Prophylaxis    Heparin   Lab Results  Component Value Date   PLT 132* 12/23/2014    Antibiotics     Anti-infectives    Start     Dose/Rate Route Frequency Ordered Stop   12/25/14 1200  vancomycin (VANCOCIN) IVPB 750 mg/150 ml premix     750 mg150 mL/hr over 60 Minutes Intravenous Every M-W-F (Hemodialysis) 12/23/14 1028     12/23/14 1200  vancomycin (VANCOCIN) 500 mg in sodium chloride 0.9 % 100 mL IVPB     500 mg100 mL/hr over 60 Minutes Intravenous  Once 12/23/14 1028     12/21/14 1200  vancomycin (VANCOCIN) IVPB 750 mg/150 ml premix  Status:   Discontinued     750 mg150 mL/hr over 60 Minutes Intravenous Every M-W-F (Hemodialysis) 12/20/14 1816 12/23/14 1028   12/20/14 2200  piperacillin-tazobactam (ZOSYN) IVPB 2.25 g     2.25 g100 mL/hr over 30 Minutes Intravenous 3 times per day 12/20/14 1816     12/20/14 2000  vancomycin (VANCOCIN) 1,500 mg in sodium chloride 0.9 % 500 mL IVPB     1,500 mg250 mL/hr over 120 Minutes Intravenous  Once 12/20/14 1816 12/20/14 2300   12/20/14 0000  azithromycin (ZITHROMAX Z-PAK) 250 MG tablet        12/20/14 0916            Objective:   Filed Vitals:   12/23/14 0924 12/23/14 0941 12/23/14 0958 12/23/14 1100  BP: $Re'85/37 75/36 98/36 'efk$ 109/45  Pulse: 144 126 127 99  Temp:   97.4 F (36.3 C)   TempSrc:   Oral   Resp: 32 32 28 28  Height:      Weight:      SpO2:   100% 89%    Wt Readings from Last 3 Encounters:  12/23/14 73 kg (160 lb 15 oz)  11/30/14 77.6 kg (171 lb 1.2 oz)  11/24/14 77.565 kg (171 lb)     Intake/Output Summary (Last 24 hours) at 12/23/14 1121 Last data filed at 12/23/14 0958  Gross per 24 hour  Intake    250 ml  Output  -1185 ml  Net   1435 ml     Physical Exam  Awake Alert, Oriented X 3, No new F.N deficits, Normal affect [place].AT,PERRAL Supple Neck,No JVD, No cervical lymphadenopathy appriciated.  Symmetrical Chest wall movement, Good air movement bilaterally, CTAB RRR,No Gallops,Rubs or new Murmurs, No Parasternal Heave +ve B.Sounds, Abd Soft, No tenderness, No organomegaly appriciated, No rebound - guarding or rigidity. No Cyanosis, Clubbing or edema, No new Rash or bruise      Data Review   Micro Results Recent Results (from the past 240 hour(s))  Wound culture     Status: None   Collection Time: 12/20/14  6:20 PM  Result Value Ref Range Status   Specimen Description WOUND LEFT THIGH GRAFT  Final   Special Requests NONE  Final   Gram Stain   Final    NO WBC SEEN FEW SQUAMOUS EPITHELIAL CELLS PRESENT ABUNDANT GRAM POSITIVE COCCI IN  PAIRS Performed at [facility]    Culture   Final    ABUNDANT METHICILLIN RESISTANT STAPHYLOCOCCUS AUREUS Note: RIFAMPIN AND GENTAMICIN SHOULD NOT BE USED AS SINGLE DRUGS FOR TREATMENT OF STAPH INFECTIONS. This organism DOES NOT demonstrate inducible Clindamycin resistance in vitro. CRITICAL RESULT CALLED TO,  READ BACK BY AND VERIFIED WITH: J CLEAVER RN  12/23/14 AT 37 AM BY [facility] Performed at [facility]    Report Status 12/23/2014 FINAL  Final   Organism ID, Bacteria METHICILLIN RESISTANT STAPHYLOCOCCUS AUREUS  Final      Susceptibility   Methicillin resistant staphylococcus aureus - MIC*    CLINDAMYCIN <=0.25 SENSITIVE Sensitive     ERYTHROMYCIN >=8 RESISTANT Resistant     GENTAMICIN <=0.5 SENSITIVE Sensitive     LEVOFLOXACIN >=8 RESISTANT Resistant     OXACILLIN >=4 RESISTANT Resistant     PENICILLIN >=0.5 RESISTANT Resistant     RIFAMPIN <=0.5 SENSITIVE Sensitive     TRIMETH/SULFA <=10 SENSITIVE Sensitive     VANCOMYCIN 1 SENSITIVE Sensitive     TETRACYCLINE <=1 SENSITIVE Sensitive     * ABUNDANT METHICILLIN RESISTANT STAPHYLOCOCCUS AUREUS  Culture, blood (routine x 2)     Status: None (Preliminary result)   Collection Time: 12/20/14  7:20 PM  Result Value Ref Range Status   Specimen Description BLOOD LEFT HAND  Final   Special Requests BOTTLES DRAWN AEROBIC ONLY [place]  Final   Culture   Final           BLOOD CULTURE RECEIVED NO GROWTH TO DATE CULTURE WILL BE HELD FOR 5 DAYS BEFORE ISSUING A FINAL NEGATIVE REPORT Performed at [facility]    Report Status PENDING  Incomplete  Culture, blood (routine x 2)     Status: None (Preliminary result)   Collection Time: 12/20/14  7:55 PM  Result Value Ref Range Status   Specimen Description BLOOD LEFT THIGH  Final   Special Requests BOTTLES DRAWN AEROBIC AND ANAEROBIC 10 CC EA  Final   Culture   Final           BLOOD CULTURE RECEIVED NO GROWTH TO DATE CULTURE WILL BE HELD FOR 5 DAYS BEFORE ISSUING A FINAL  NEGATIVE REPORT Performed at [facility]    Report Status PENDING  Incomplete    Radiology Reports Dg Chest 2 View  12/17/2014   CLINICAL DATA:  Chest pain last night. The pain was relieved with sublingual nitroglycerin.  EXAM: CHEST  2 VIEW  COMPARISON:  11/27/2014, 11/18/2014, 11/07/2014  FINDINGS: There is prior sternotomy and CABG. There is a right axillary-subclavian vascular stent. There are surgical clips in the left axilla.  There is no airspace consolidation or alveolar edema. There is mild vascular and interstitial thickening consistent with a minor degree of congestive failure. This is not as severe as on 11/27/2014. There is a tiny left pleural effusion. This is smaller than on 12/15/2014. Hilar, mediastinal and cardiac contours appear unremarkable. There is atherosclerotic calcification in the aorta.  IMPRESSION: Findings consistent with a mild degree of congestive failure with a very small left pleural effusion.   Electronically Signed   By: Andreas Newport M.D.   On: 12/17/2014 12:35   Dg Chest 2 View  11/27/2014   CLINICAL DATA:  Shortness of breath and chest pain  EXAM: CHEST  2 VIEW  COMPARISON:  11/18/2014  FINDINGS: Small bilateral pleural effusions. No overt pulmonary edema. Mild cardiomegaly that is stable from prior. The aorta and hila are negative. The patient is status post CABG. Surgical changes to the left axilla. Prior right subclavian stenting.  IMPRESSION: Pulmonary venous congestion with small pleural effusions.   Electronically Signed   By: Jorje Guild M.D.   On: 11/27/2014 06:16   Dg Chest Port 1 View  12/21/2014  CLINICAL DATA:  Aspiration.  EXAM: PORTABLE CHEST - 1 VIEW  COMPARISON:  12/20/2014.  FINDINGS: Mediastinum and hilar structures normal. Prior CABG. Stable cardiomegaly and pulmonary vascular congestion with stable mild interstitial prominence. These findings suggest congestive heart failure. Pneumonitis project in the right lung base cannot be  excluded. No pleural effusion or pneumothorax. Right subclavian vascular stents are noted. The stents are in and angled position.  IMPRESSION: 1. Prior CABG. Persistent changes of mild congestive heart failure. Pneumonitis cannot be excluded, particularly in the right lung base. No interim change from prior exam . 2. Right subclavian vascular stents are noted. They are in an angled position.   Electronically Signed   By: Marcello Moores  Register   On: 12/21/2014 07:32   Dg Chest Port 1 View  12/20/2014   CLINICAL DATA:  Chest pain and shortness of Breath  EXAM: PORTABLE CHEST - 1 VIEW  COMPARISON:  12/17/2014  FINDINGS: The heart size is mildly enlarged. Atherosclerotic calcification noted within the aortic arch. Previous median sternotomy and CABG procedure. Moderate pulmonary vascular congestion is noted. No airspace consolidation.  IMPRESSION: Mild cardiac enlargement and pulmonary vascular congestion   Electronically Signed   By: Kerby Moors M.D.   On: 12/20/2014 11:29   Dg Swallowing Func-speech Pathology  12/21/2014   Lamar Sprinkles, CCC-SLP     12/21/2014  2:51 PM  Objective Swallowing Evaluation: Modified Barium Swallowing Study   Patient Details  Name: DHANVIN SZETO MRN: [id number] Date of Birth: [birth year]  Today's Date: 12/21/2014 Time: SLP Start Time (ACUTE ONLY): 0135-SLP Stop Time (ACUTE  ONLY): 0200 SLP Time Calculation (min) (ACUTE ONLY): 25 min  Past Medical History:  Past Medical History  Diagnosis Date  . ESRD on hemodialysis     Hemodialysis since 2003; Occluded access in both upper  extremities and the right lower extremity, current access as of  Aug 2014 is L thigh AVG. Gets HD MWF at [facility].     . Duodenal ulcer     remote  . Chronic combined systolic and diastolic CHF (congestive heart  failure)     a. 02/2012 Echo: EF 65%, basal infolat AK, mild conc LVH;  b.  06/2013 Echo: EF 40-45%, mild LVH, Gr2 DD, basal inf HK->AK, Mod  AS, Mild MR, PASP 73;  c. 09/2014 Echo: EF 45%  basal-mid inflat  and inf AK, Gr 2DD, mod AS.  Marland Kitchen Arteriosclerotic cardiovascular disease (ASCVD) 1998    a. [id number]; b. 06/2003 Cath: 4/4 patent grafts, EF 55%;  c.  09/2014 Cath: LM 99d, LADnl, LCX nl, RCA 100p, LIMA->LAD nl,  VG->OM2->OM3 nl, VG->RPL nl, EF 40-45%.  . Cerebrovascular disease   . Neuropathy of foot   . Degenerative joint disease   . Gastroesophageal reflux disease   . Impaired hearing     Bilateral; hearing aids relatively ineffective  . Hypertension   . Cholelithiasis 03/2011    Diagnosed incidentally on abdominal ultrasound in 03/2011  . Hepatic steatosis   . Macrocytosis 10/20/2012  . Thrombocytopenia 10/20/2012  . Hyperlipidemia   . Secondary hyperparathyroidism   . Peripheral vascular disease   . Gout   . Moderate aortic stenosis     a. 06/2013 Echo: Mod AS, mean grad: 25, peak grad: 57;  b.  09/2014 Echo: EF 45%, Gr 2 DD, Mod AS, valve area 1.15cm^2 (VTI),  1.1cm^2 (Vmax).  . Pulmonary hypertension     a. 06/2013 Echo: PASP 92mmHg.  Marland Kitchen Anemia     Prior blood  transfusion  . Complication of anesthesia     daughter reports long episodes confusion after amnesia meds  . Renal cell carcinoma 2001    s/p right nephrectomy-2001; subsequent ESRD  . 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 Hypotension   . Pancytopenia   . MRSA infection     a. 08/2014->10/2014 L thigh dialysis graft.  . Infection of AV graft for dialysis     a. Recurrent L thigh infxn s/p revision 10/1, I & D 11/28, and  removal 11/01/2014.  Marland Kitchen Acute decompensated heart failure    Past Surgical History:  Past Surgical History  Procedure Laterality Date  . Right nephrectomy  2001    Renal cell carcinoma  . Colonoscopy  01/24/2011    prominent vascular pattern, suboptimal prep but doable  . Esophagogastroduodenoscopy  01/24/2011    mild erosive reflux esophagitis, bulbar/antral erosions, bx  from antrum benign  . Av fistula repair  2008    revision of anastomosis of Right AVF  . Arteriovenous graft placement  2006     Thrombectomy and interposition jump graft revision to higher  axillary vein of LUA AVG  . Coronary artery bypass graft  1998    X4 VESSELS  . Multiple cysto/ resection tumor's with bx's  LAST ONE  05-09-2011  . Multiple surg's / interventions for avgg/ fistula right upper  arm  LAST REVISION 06-29-2011    (JUNE 1950 [place] VEIN/ [phone] DILATATION  ANGIOPLASTY)  . Left ptc left renal artery    . Cardiac catheterization  2004  . Cataract extraction w/ intraocular lens  implant, bilateral    . Cystoscopy w/ retrogrades  12/28/2011    Procedure: CYSTOSCOPY WITH RETROGRADE PYELOGRAM;  Surgeon: Malka So, MD;  Location: [facility];  Service:  Urology;  Laterality: Left;  C-ARM   . Transurethral resection of bladder tumor  12/28/2011    Procedure: TRANSURETHRAL RESECTION OF BLADDER TUMOR (TURBT);   Surgeon: Malka So, MD;  Location: [facility];   Service: Urology;  Laterality: N/A;  . Av fistula placement  02/29/2012    Procedure: INSERTION OF ARTERIOVENOUS (AV) GORE-TEX GRAFT  THIGH;  Surgeon: Rosetta Posner, MD;  Location: [place];  Service:  Vascular;  Laterality: Right;  Insertion right femoral  arteriovenous gortex graft  . Ligation of right braciocephalic av fistula Right 04-04-2012  . Insertion of dialysis catheter  06/05/2012    Procedure: INSERTION OF DIALYSIS CATHETER;  Surgeon: Mal Misty, MD;  Location: [place];  Service: Vascular;  Laterality:  Left;  Insertion diatek catheter left IJ  . Insertion of dialysis catheter  08/22/2012    Procedure: INSERTION OF DIALYSIS CATHETER;  Surgeon: Serafina Mitchell, MD;  Location: [facility];  Service: Vascular;   Laterality: Left;  insertion of dialysis catheter left  internal  jugular 27cm  . Av fistula placement  10/08/2012    Procedure: INSERTION OF ARTERIOVENOUS (AV) GORE-TEX GRAFT  THIGH;  Surgeon: Angelia Mould, MD;  Location: [place];   Service: Vascular;  Laterality: Left;  . Cystoscopy with biopsy N/A 08/07/2013     Procedure: CYSTOSCOPY WITH BLADDER BIOPSY;  Surgeon: Malka So, MD;  Location: [facility];  Service: Urology;  Laterality:  N/A;  . Fulguration of bladder tumor N/A 08/07/2013    Procedure: FULGURATION OF BLADDER TUMOR;  Surgeon: Malka So, MD;  Location: [facility];  Service: Urology;  Laterality:  N/A;  . Revision of arteriovenous goretex graft Left 08/27/2014    Procedure: THROMBECTOMY & REVISION OF LEFT ARM  ARTERIOVENOUS  GORETEX GRAFT;  Surgeon: Angelia Mould, MD;  Location: [place];  Service: Vascular;  Laterality: Left;  . Revision of arteriovenous goretex graft Left 10/01/2014    Procedure: SEGMENT OF Left THIGH ARTERIOVENOUS GORETEX GRAFT  Removed;  Surgeon: Mal Misty, MD;  Location: [place];   Service: Vascular;  Laterality: Left;  . Removal of graft Left 11/01/2014    Procedure: REMOVAL OF INFECTED LEFT THIGH GRAFT;  Surgeon:  Serafina Mitchell, MD;  Location: [place] [facility];  Service: Vascular;   Laterality: Left;  removal of infected left dialysis graft.  . Left heart catheterization with coronary/graft angiogram N/A  10/27/2014    Procedure: LEFT HEART CATHETERIZATION WITH Beatrix Fetters;  Surgeon: Leonie Man, MD;  Location: [facility] C[facility];   Service: Cardiovascular;  Laterality: N/A;  . Tonsillectomy     HPI:  HPI: 81 y/o ? ESRD m/w/f,  H/o CVA, suspect COPD recent admission  11/27/14-11/30/14 Decompensated HF 2/2 to dietary indiscretion  No Data Recorded  Assessment / Plan / Recommendation CHL IP CLINICAL IMPRESSIONS 12/21/2014  Dysphagia Diagnosis (None)  Clinical impression Pt with oral/pharyngeal dysphagia  characterized by delayed oral transit, uncoordinated mastication,  delay in the swallow trigger, valleculae residue and pharyngeal  weakness.  Penetration occured during the swallow impacted by  decreased epiglottic inversion due to sensory and motor deficits.   Chin tuck was not effective to eliminate penetration with thin  liquid boluses from a cup.  Esophageal sweep revealed it mildly   slow to clear.  Rx begin dysphagia 2 diet with nectar thick  liquids.  Pt to be totally upright for all intake and should not  use straws.        CHL IP TREATMENT RECOMMENDATION 12/21/2014  Treatment Plan Recommendations Therapy as outlined in treatment  plan below     CHL IP DIET RECOMMENDATION 12/21/2014  Diet Recommendations Dysphagia 2 (Fine chop);Nectar-thick liquid  Liquid Administration via Cup;No straw  Medication Administration Whole meds with puree  Compensations Slow rate;Small sips/bites;Check for anterior  loss;Follow solids with liquid  Postural Changes and/or Swallow Maneuvers Out of bed for  meals;Seated upright 90 degrees;Upright 30-60 min after meal     CHL IP OTHER RECOMMENDATIONS 12/21/2014  Recommended Consults (None)  Oral Care Recommendations Oral care Q4 per protocol  Other Recommendations Order thickener from pharmacy;Prohibited  food (jello, ice cream, thin soups);Remove water pitcher     CHL IP FOLLOW UP RECOMMENDATIONS 12/21/2014  Follow up Recommendations Home health SLP     CHL IP FREQUENCY AND DURATION 12/21/2014  Speech Therapy Frequency (ACUTE ONLY) min 2x/week  Treatment Duration 2 weeks     Pertinent Vitals/Pain None verbalized    SLP Swallow Goals No flowsheet data found.  No flowsheet data found.    CHL IP REASON FOR REFERRAL 12/21/2014  Reason for Referral Objectively evaluate swallowing function     CHL IP ORAL PHASE 12/21/2014  Lips (None)  Tongue (None)  Mucous membranes (None)  Nutritional status (None)  Other (None)  Oxygen therapy (None)  Oral Phase Impaired  Oral - Pudding Teaspoon WFL;Delayed oral transit  Oral - Pudding Cup (None)  Oral - Honey Teaspoon (None)  Oral - Honey Cup (None)  Oral - Honey Syringe (None)  Oral - Nectar Teaspoon (None)  Oral - Nectar Cup Right anterior bolus loss  Oral - Nectar  Straw (None)  Oral - Nectar Syringe (None)  Oral - Ice Chips (None)  Oral - Thin Teaspoon Right anterior bolus loss  Oral - Thin Cup Right anterior bolus loss  Oral - Thin Straw  (None)  Oral - Thin Syringe (None)  Oral - Puree (None)  Oral - Mechanical Soft (None)  Oral - Regular Weak lingual manipulation;Impaired  mastication;Delayed oral transit  Oral - Multi-consistency (None)  Oral - Pill (None)  Oral Phase - Comment Pt with some tongue base residue for purees  that would spill into the vallecula.        CHL IP PHARYNGEAL PHASE 12/21/2014  Pharyngeal Phase Impaired Pharyngeal - Pudding Teaspoon Delayed  swallow initiation;Reduced epiglottic inversion;Premature  spillage to valleculae;Pharyngeal residue - valleculae  Penetration/Aspiration details (pudding teaspoon) (None)  Pharyngeal - Pudding Cup (None)  Penetration/Aspiration details (pudding cup) (None)  Pharyngeal - Honey Teaspoon (None)  Penetration/Aspiration details (honey teaspoon) (None)  Pharyngeal - Honey Cup (None)  Penetration/Aspiration details (honey cup) (None)  Pharyngeal - Honey Syringe (None)  Penetration/Aspiration details (honey syringe) (None)  Pharyngeal - Nectar Teaspoon (None)  Penetration/Aspiration details (nectar teaspoon) (None)  Pharyngeal - Nectar Cup Delayed swallow initiation;Reduced  epiglottic inversion;Pharyngeal residue - valleculae;Premature  spillage to valleculae  Penetration/Aspiration details (nectar cup) (None)  Pharyngeal - Nectar Straw (None)  Penetration/Aspiration details (nectar straw) (None)  Pharyngeal - Nectar Syringe (None)  Penetration/Aspiration details (nectar syringe) (None)  Pharyngeal - Ice Chips (None)  Penetration/Aspiration details (ice chips) (None)  Pharyngeal - Thin Teaspoon Delayed swallow initiation;Reduced  epiglottic inversion;Pharyngeal residue - valleculae;Premature  spillage to valleculae;Premature spillage to pyriform sinuses  Penetration/Aspiration details (thin teaspoon) (None)  Pharyngeal - Thin Cup Delayed swallow initiation;Premature  spillage to valleculae;Premature spillage to pyriform  sinuses;Reduced epiglottic inversion;Penetration/Aspiration  during  swallow;Trace aspiration;Pharyngeal residue - valleculae  Penetration/Aspiration details (thin cup) Material enters airway,  remains ABOVE vocal cords and not ejected out  Pharyngeal - Thin Straw (None)  Penetration/Aspiration details (thin straw) (None)  Pharyngeal - Thin Syringe (None)  Penetration/Aspiration details (thin syringe') (None)  Pharyngeal - Puree Delayed swallow initiation;Premature spillage  to valleculae;Reduced epiglottic inversion;Pharyngeal residue -  valleculae  Penetration/Aspiration details (puree) (None)  Pharyngeal - Mechanical Soft (None)  Penetration/Aspiration details (mechanical soft) (None)  Pharyngeal - Regular Delayed swallow initiation;Premature  spillage to valleculae;Reduced epiglottic inversion;Pharyngeal  residue - valleculae  Penetration/Aspiration details (regular) (None)  Pharyngeal - Multi-consistency (None)  Penetration/Aspiration details (multi-consistency) (None)  Pharyngeal - Pill (None)  Penetration/Aspiration details (pill) (None)  Pharyngeal Comment (None)     CHL IP CERVICAL ESOPHAGEAL PHASE 12/21/2014  Cervical Esophageal Phase WFL  Pudding Teaspoon (None)  Pudding Cup (None)  Honey Teaspoon (None)  Honey Cup (None)  Honey Syringe (None)  Nectar Teaspoon (None)  Nectar Cup (None)  Nectar Straw (None)  Nectar Syringe (None)  Thin Teaspoon (None)  Thin Cup (None)  Thin Straw (None)  Thin Syringe (None)  Cervical Esophageal Comment (None)    No flowsheet data found.         Lamar Sprinkles 12/21/2014, 2:49 PM  Shelly Flatten, MA, CCC-SLP Acute Rehab SLP [phone]       CBC  Recent Labs Lab 12/19/14 [phone] 12/20/14 0530 12/21/14 0241 12/22/14 0334 12/23/14 0732  WBC 3.7* 3.9* 3.5* 3.4* 5.6  HGB 8.3* 9.2* 8.0* 7.3* 7.7*  HCT 25.4* 28.4* 25.2* 22.5* 23.6*  PLT 105* 140* 121* 117* 132*  MCV 97.7 97.6 97.7 97.8 96.7  MCH 31.9 31.6 31.0 31.7 31.6  MCHC 32.7  32.4 31.7 32.4 32.6  RDW 22.8* 22.1* 21.8* 21.5* 21.7*    Chemistries   Recent Labs Lab  12/18/14 1124 12/19/14 0419 12/20/14 0530 12/21/14 0241 12/22/14 0334 12/23/14 0732  NA 138 136 134* 138 138 136  K 3.8 4.2 4.9 4.7 4.5 4.7  CL 99 97 94* 97 99 98  CO2 32 $Remo'25 27 28 29 27  'FSBVe$ GLUCOSE 102* 98 86 89 115* 106*  BUN 9 23 46* 44* 37* 56*  CREATININE 2.96* 4.70* 7.21* 6.93* 5.71* 7.78*  CALCIUM 8.3* 8.0* 7.8* 8.5 8.1* 7.8*  AST 41*  --   --   --   --   --   ALT 25  --   --   --   --   --   ALKPHOS 141*  --   --   --   --   --   BILITOT 0.8  --   --   --   --   --    ------------------------------------------------------------------------------------------------------------------ estimated creatinine clearance is 6.6 mL/min (by C-G formula based on Cr of 7.78). ------------------------------------------------------------------------------------------------------------------ No results for input(s): HGBA1C in the last 72 hours. ------------------------------------------------------------------------------------------------------------------ No results for input(s): CHOL, HDL, LDLCALC, TRIG, CHOLHDL, LDLDIRECT in the last 72 hours. ------------------------------------------------------------------------------------------------------------------ No results for input(s): TSH, T4TOTAL, T3FREE, THYROIDAB in the last 72 hours.  Invalid input(s): FREET3 ------------------------------------------------------------------------------------------------------------------ No results for input(s): VITAMINB12, FOLATE, FERRITIN, TIBC, IRON, RETICCTPCT in the last 72 hours.  Coagulation profile  Recent Labs Lab 12/18/14 0339  INR 1.05    No results for input(s): DDIMER in the last 72 hours.  Cardiac Enzymes  Recent Labs Lab 12/17/14 1630 12/17/14 2101 12/18/14 1124  TROPONINI 2.12* 2.42* 1.92*   ------------------------------------------------------------------------------------------------------------------ Invalid input(s): POCBNP     Time Spent in minutes    35   Tylisa Alcivar K M.D on 12/23/2014 at 11:21 AM  Between 7am to 7pm - Pager - [phone]  After 7pm go to [email or web address] - [facility] Office  [phone]

## 2014-12-23 NOTE — Progress Notes (Signed)
SLP Cancellation Note  Patient Details Name: TAKEO HARTS MRN: [id number] DOB: 09/26/1926   Cancelled treatment:       Reason Eval/Treat Not Completed: Patient at procedure or test/unavailable   Sneha Willig, Katherene Ponto 12/23/2014, 8:31 AM

## 2014-12-23 NOTE — Progress Notes (Addendum)
Inpatient Rehabilitation  I note Dr. Letta Pate has completed his consult on Mr. Mcinturff and is recommending SNF level care following  acute hospitalization.  He does not believe pt. Would be able to tolerate intensity of CIR.  I have informed pt. who does not appear to have good understanding of this information.  I have reached out to Madelyn Flavors, daughter, and left voice mails on her home and cell phones for her to return my call.  I updated Elissa Hefty , CM of the above and she has already placed a SNF consult request in case pt. does not progress adequately for eventual home Dc.    Please call if questions.  [facility] Admissions Coordinator Cell [phone] Office [phone]

## 2014-12-23 NOTE — Consult Note (Signed)
Physical Medicine and Rehabilitation Consult Reason for Consult: Debilitation/multi-medical Referring Physician: [facility]ad   HPI: Andrew [name] is a 81 y.o. right handed male with history of end-stage renal disease with hemodialysis, systolic and diastolic congestive heart failure, CAD and moderate aortic stenosis with CABG, CVA, bladder cancer status post TURP, myelodysplasia and undergoing chemotherapy per Dr.Gorsuch. By report patient lives alone and independent with walker prior to admission. Presented 12/17/2014 with generalized weakness and nonspecific chest pain. Chest x-ray showed pulmonary edema. BNP 25961, troponin 2.4, troponin 7.3. EKG showed SR nonspecific ST T wave changes. Cardiology consulted recent cardiac catheterization in December showed grafts to be patent with no discrete lesions in his native arteries. Elevated troponin felt to be secondary to demand ischemia troponins have been trending down no further workup per cardiology services. Renal service follow-up for ongoing hemodialysis as advised. Patient continued to improve plan was for discharge to home 12/20/2014 when he developed some increased lethargy and low-grade fever. He was transferred to stepdown unit and placed on broad-spectrum antibiotics for question aspiration pneumonia. Swallow evaluation per speech therapy placed on a dysphagia 2 nectar thick liquid diet. Patient has been transfused during hospital course weight is hemoglobin 9.1. Physical therapy evaluation completed and ongoing with mobility variable. M.D. has requested physical medicine rehabilitation consult.  In dialysis this morning patient's blood pressure dropped to 29-92 systolic, this was accompanied by heart rates up to 160, A. Fib on monitor. Review of Systems  Cardiovascular: Positive for chest pain.  Gastrointestinal: Positive for constipation.  Musculoskeletal: Positive for myalgias.  Neurological: Positive for weakness.  All other systems  reviewed and are negative.  Past Medical History  Diagnosis Date  . ESRD on hemodialysis     Hemodialysis since 2003; Occluded access in both upper extremities and the right lower extremity, current access as of Aug 2014 is L thigh AVG. Gets HD MWF at [facility].     . Duodenal ulcer     remote  . Chronic combined systolic and diastolic CHF (congestive heart failure)     a. 02/2012 Echo: EF 65%, basal infolat AK, mild conc LVH;  b. 06/2013 Echo: EF 40-45%, mild LVH, Gr2 DD, basal inf HK->AK, Mod AS, Mild MR, PASP 73;  c. 09/2014 Echo: EF 45% basal-mid inflat and inf AK, Gr 2DD, mod AS.  Marland Kitchen Arteriosclerotic cardiovascular disease (ASCVD) 1998    a. [id number]; b. 06/2003 Cath: 4/4 patent grafts, EF 55%;  c. 09/2014 Cath: LM 99d, LADnl, LCX nl, RCA 100p, LIMA->LAD nl, VG->OM2->OM3 nl, VG->RPL nl, EF 40-45%.  . Cerebrovascular disease   . Neuropathy of foot   . Degenerative joint disease   . Gastroesophageal reflux disease   . Impaired hearing     Bilateral; hearing aids relatively ineffective  . Hypertension   . Cholelithiasis 03/2011    Diagnosed incidentally on abdominal ultrasound in 03/2011  . Hepatic steatosis   . Macrocytosis 10/20/2012  . Thrombocytopenia 10/20/2012  . Hyperlipidemia   . Secondary hyperparathyroidism   . Peripheral vascular disease   . Gout   . Moderate aortic stenosis     a. 06/2013 Echo: Mod AS, mean grad: 25, peak grad: 57;  b. 09/2014 Echo: EF 45%, Gr 2 DD, Mod AS, valve area 1.15cm^2 (VTI), 1.1cm^2 (Vmax).  . Pulmonary hypertension     a. 06/2013 Echo: PASP 24mmHg.  Marland Kitchen Anemia     Prior blood transfusion  . Complication of anesthesia     daughter reports long episodes  confusion after amnesia meds  . Renal cell carcinoma 2001    s/p right nephrectomy-2001; subsequent ESRD  . 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 Hypotension   . Pancytopenia   . MRSA infection     a. 08/2014->10/2014 L thigh dialysis graft.  . Infection of  AV graft for dialysis     a. Recurrent L thigh infxn s/p revision 10/1, I & D 11/28, and removal 11/01/2014.  Marland Kitchen Acute decompensated heart failure    Past Surgical History  Procedure Laterality Date  . Right nephrectomy  2001    Renal cell carcinoma  . Colonoscopy  01/24/2011    prominent vascular pattern, suboptimal prep but doable  . Esophagogastroduodenoscopy  01/24/2011    mild erosive reflux esophagitis, bulbar/antral erosions, bx from antrum benign  . Av fistula repair  2008    revision of anastomosis of Right AVF  . Arteriovenous graft placement  2006    Thrombectomy and interposition jump graft revision to higher axillary vein of LUA AVG  . Coronary artery bypass graft  1998    X4 VESSELS  . Multiple cysto/ resection tumor's with bx's  LAST ONE 05-09-2011  . Multiple surg's / interventions for avgg/ fistula right upper arm  LAST REVISION 06-29-2011    (JUNE [address] [place] VEIN/ [id number] DILATATION ANGIOPLASTY)  . Left ptc left renal artery    . Cardiac catheterization  2004  . Cataract extraction w/ intraocular lens  implant, bilateral    . Cystoscopy w/ retrogrades  12/28/2011    Procedure: CYSTOSCOPY WITH RETROGRADE PYELOGRAM;  Surgeon: Malka So, MD;  Location: [facility];  Service: Urology;  Laterality: Left;  C-ARM   . Transurethral resection of bladder tumor  12/28/2011    Procedure: TRANSURETHRAL RESECTION OF BLADDER TUMOR (TURBT);  Surgeon: Malka So, MD;  Location: [facility];  Service: Urology;  Laterality: N/A;  . Av fistula placement  02/29/2012    Procedure: INSERTION OF ARTERIOVENOUS (AV) GORE-TEX GRAFT THIGH;  Surgeon: Rosetta Posner, MD;  Location: [place];  Service: Vascular;  Laterality: Right;  Insertion right femoral arteriovenous gortex graft  . Ligation of right braciocephalic av fistula Right 04-04-2012  . Insertion of dialysis catheter  06/05/2012    Procedure: INSERTION OF DIALYSIS CATHETER;  Surgeon: Mal Misty,  MD;  Location: [place];  Service: Vascular;  Laterality: Left;  Insertion diatek catheter left IJ  . Insertion of dialysis catheter  08/22/2012    Procedure: INSERTION OF DIALYSIS CATHETER;  Surgeon: Serafina Mitchell, MD;  Location: [facility];  Service: Vascular;  Laterality: Left;  insertion of dialysis catheter left  internal jugular 27cm  . Av fistula placement  10/08/2012    Procedure: INSERTION OF ARTERIOVENOUS (AV) GORE-TEX GRAFT THIGH;  Surgeon: Angelia Mould, MD;  Location: [place];  Service: Vascular;  Laterality: Left;  . Cystoscopy with biopsy N/A 08/07/2013    Procedure: CYSTOSCOPY WITH BLADDER BIOPSY;  Surgeon: Malka So, MD;  Location: [facility];  Service: Urology;  Laterality: N/A;  . Fulguration of bladder tumor N/A 08/07/2013    Procedure: FULGURATION OF BLADDER TUMOR;  Surgeon: Malka So, MD;  Location: [facility];  Service: Urology;  Laterality: N/A;  . Revision of arteriovenous goretex graft Left 08/27/2014    Procedure: THROMBECTOMY & REVISION OF LEFT ARM  ARTERIOVENOUS GORETEX GRAFT;  Surgeon: Angelia Mould, MD;  Location: [place];  Service: Vascular;  Laterality: Left;  .  Revision of arteriovenous goretex graft Left 10/01/2014    Procedure: SEGMENT OF Left THIGH ARTERIOVENOUS GORETEX GRAFT Removed;  Surgeon: Mal Misty, MD;  Location: [place];  Service: Vascular;  Laterality: Left;  . Removal of graft Left 11/01/2014    Procedure: REMOVAL OF INFECTED LEFT THIGH GRAFT;  Surgeon: Serafina Mitchell, MD;  Location: [facility];  Service: Vascular;  Laterality: Left;  removal of infected left dialysis graft.  . Left heart catheterization with coronary/graft angiogram N/A 10/27/2014    Procedure: LEFT HEART CATHETERIZATION WITH Beatrix Fetters;  Surgeon: Leonie Man, MD;  Location: [facility] C[facility];  Service: Cardiovascular;  Laterality: N/A;  . Tonsillectomy     Family History  Problem Relation Age of Onset  . Colon cancer Neg Hx   . Liver disease Neg Hx   . Anesthesia  problems Neg Hx   . Heart disease Father   . Other Mother     died @ 81 with respiratory issues following hip fx  . Diabetes Mother    Social History:  reports that he quit smoking about 30 years ago. His smoking use included Cigarettes. He has a 25 pack-year smoking history. He has quit using smokeless tobacco. He reports that he drinks alcohol. He reports that he does not use illicit drugs. Allergies: No Known Allergies Medications Prior to Admission  Medication Sig Dispense Refill  . acetaminophen (TYLENOL) 500 MG tablet Take 500 mg by mouth every 6 (six) hours as needed. For pain/headache    . albuterol (PROVENTIL HFA;VENTOLIN HFA) 108 (90 BASE) MCG/ACT inhaler Inhale 2 puffs into the lungs every 6 (six) hours as needed for wheezing or shortness of breath. 1 Inhaler 0  . allopurinol (ZYLOPRIM) 100 MG tablet Take 100 mg by mouth 2 (two) times daily.     Marland Kitchen aspirin EC 81 MG tablet Take 81 mg by mouth at bedtime.     . B Complex-C-Folic Acid (DIALYVITE TABLET) TABS Take 1 tablet by mouth daily.     . calcium carbonate (TUMS - DOSED IN MG ELEMENTAL CALCIUM) 500 MG chewable tablet Chew 1 tablet by mouth daily as needed for heartburn. For heartburn    . cetaphil (CETAPHIL) lotion Apply 1 application topically daily.    . cinacalcet (SENSIPAR) 30 MG tablet Take 30 mg by mouth daily with breakfast.     . colchicine 0.6 MG tablet Take 0.6 mg by mouth 2 (two) times daily as needed (for gout).    Marland Kitchen econazole nitrate 1 % cream Apply 1 application topically daily as needed (Athletes foot).    . Emollient (LUBRIDERM SERIOUSLY SENSITIVE) LOTN Apply 1 application topically daily.    Marland Kitchen gabapentin (NEURONTIN) 100 MG capsule Take 100-200 mg by mouth 2 (two) times daily. Takes 1 in the morning and 2 at night    . midodrine (PROAMATINE) 10 MG tablet Take 10-20 mg by mouth See admin instructions. On Dialysis days (M,W,F), patient takes 2 tabs in the morning and 1 in the evening and on the other days patient takes  1 tablet in the morning and 2 in teh evening    . nitroGLYCERIN (NITROSTAT) 0.4 MG SL tablet Place 1 tablet (0.4 mg total) under the tongue every 5 (five) minutes as needed for chest pain. 90 tablet 3  . oxyCODONE-acetaminophen (PERCOCET/ROXICET) 5-325 MG per tablet Take one tablet by mouth every 6 hours as needed for pain 120 tablet 0  . polyethylene glycol powder (MIRALAX) powder Take 17 g by mouth at bedtime.     Marland Kitchen  sevelamer (RENVELA) 800 MG tablet Take 800-1,600 mg by mouth See admin instructions. Take 2 tabs with each meals and 1 tab with snacks      Home: Home Living Family/patient expects to be discharged to:: Private residence Living Arrangements: Alone, Other (Comment) (family can stay with as needed.) Available Help at Discharge: Family, Available 24 hours/day Type of Home: House Home Access: Stairs to enter [employer] of Steps: 1 Entrance Stairs-Rails: Left Home Layout: One level Home Equipment: Environmental consultant - 4 wheels, Environmental consultant - 2 wheels, Wheelchair - manual, [employer] - single point, Civil engineer, contracting Additional Comments: Daughters have been staying with over last 6 weeks. Someone drives him to HD.  Functional History: Prior Function Level of Independence: Independent with assistive device(s) Comments: with rollator Functional Status:  Mobility: Bed Mobility Overal bed mobility: Needs Assistance Bed Mobility: Supine to Sit Supine to sit: Mod assist Sit to supine: Min assist General bed mobility comments: Assist to bring legs over and trunk up into sitting. Transfers Overall transfer level: Needs assistance Equipment used: 4-wheeled walker, Ambulation equipment used [employer]) Transfers: Sit to/from Stand, [employer] Sit to Stand: +2 physical assistance, Mod assist Stand pivot transfers: +2 physical assistance, Mod assist General transfer comment: Assist to bring hips up. Initially stood pt with walker but too weak to take steps. Returned to sitting and then stood  with [place] and pivoted pt with [place]. Ambulation/Gait Ambulation/Gait assistance: Min assist Ambulation Distance (Feet): 35 Feet (x 2) Assistive device: 4-wheeled walker Gait Pattern/deviations: Step-through pattern, Decreased step length - right, Decreased step length - left, Shuffle, Trunk flexed Gait velocity interpretation: Below normal speed for age/gender General Gait Details: Verbal cues to stand more erect and stay closer to walker. Pt required 1 sitting rest break on rollator.    ADL:    Cognition: Cognition Overall Cognitive Status: Within Functional Limits for tasks assessed Orientation Level: Oriented X4 Cognition Arousal/Alertness: Awake/alert Behavior During Therapy: WFL for tasks assessed/performed Overall Cognitive Status: Within Functional Limits for tasks assessed  Blood pressure 95/56, pulse 88, temperature 97.5 F (36.4 C), temperature source Oral, resp. rate 21, height $RemoveBe'5\' 8"'[id number]$  (1.727 m), weight 72.3 kg (159 lb 6.3 oz), SpO2 99 %. Physical Exam  Constitutional:  81 year old frail elderly male  HENT:  Head: Normocephalic.  Eyes: EOM are normal.  Neck: Normal range of motion. Neck supple. No thyromegaly present.  Cardiovascular: An irregularly irregular rhythm present. Tachycardia present.   Murmur heard.  Systolic murmur is present with a grade of 3/6  Respiratory:  Decreased breath sounds at the bases with limited inspiratory effort  GI: Soft. Bowel sounds are normal. He exhibits no distension.  Musculoskeletal:  No pain with upper extremity range of motion however decreased range of motion bilateral shoulders  Motor strength 4/5 bilateral deltoid, biceps, triceps, grip  Lower extremities not tested patient having dialysis catheter unhooked from the left lower extremity, attempting to achieve hemostasis  Neurological:  Patient is lethargic but arousable. He makes eye contact with examiner. He was able to provide his name and age and follow simple commands  but would fall asleep during exam  Skin: Skin is warm and dry.  Very hard of hearing   No results found for this or any previous visit (from the past 24 hour(s)). Dg Swallowing Func-speech Pathology  12/21/2014   Lamar Sprinkles, CCC-SLP     12/21/2014  2:51 PM  Objective Swallowing Evaluation: Modified Barium Swallowing Study   Patient Details  Name: Andrew [name] MRN: [id number]  Date of Birth: 1926/04/13  Today's Date: 12/21/2014 Time: SLP Start Time (ACUTE ONLY): 0135-SLP Stop Time (ACUTE  ONLY): 0200 SLP Time Calculation (min) (ACUTE ONLY): 25 min  Past Medical History:  Past Medical History  Diagnosis Date  . ESRD on hemodialysis     Hemodialysis since 2003; Occluded access in both upper  extremities and the right lower extremity, current access as of  Aug 2014 is L thigh AVG. Gets HD MWF at [facility].     . Duodenal ulcer     remote  . Chronic combined systolic and diastolic CHF (congestive heart  failure)     a. 02/2012 Echo: EF 65%, basal infolat AK, mild conc LVH;  b.  06/2013 Echo: EF 40-45%, mild LVH, Gr2 DD, basal inf HK->AK, Mod  AS, Mild MR, PASP 73;  c. 09/2014 Echo: EF 45% basal-mid inflat  and inf AK, Gr 2DD, mod AS.  Marland Kitchen Arteriosclerotic cardiovascular disease (ASCVD) 1998    a. [id number]; b. 06/2003 Cath: 4/4 patent grafts, EF 55%;  c.  09/2014 Cath: LM 99d, LADnl, LCX nl, RCA 100p, LIMA->LAD nl,  VG->OM2->OM3 nl, VG->RPL nl, EF 40-45%.  . Cerebrovascular disease   . Neuropathy of foot   . Degenerative joint disease   . Gastroesophageal reflux disease   . Impaired hearing     Bilateral; hearing aids relatively ineffective  . Hypertension   . Cholelithiasis 03/2011    Diagnosed incidentally on abdominal ultrasound in 03/2011  . Hepatic steatosis   . Macrocytosis 10/20/2012  . Thrombocytopenia 10/20/2012  . Hyperlipidemia   . Secondary hyperparathyroidism   . Peripheral vascular disease   . Gout   . Moderate aortic stenosis     a. 06/2013 Echo: Mod AS, mean grad: 25, peak grad: 57;  b.   09/2014 Echo: EF 45%, Gr 2 DD, Mod AS, valve area 1.15cm^2 (VTI),  1.1cm^2 (Vmax).  . Pulmonary hypertension     a. 06/2013 Echo: PASP .  Marland Kitchen Anemia     Prior blood transfusion  . Complication of anesthesia     daughter reports long episodes confusion after amnesia meds  . Renal cell carcinoma 2001    s/p right nephrectomy-2001; subsequent ESRD  . 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 Hypotension   . Pancytopenia   . MRSA infection     a. 08/2014->10/2014 L thigh dialysis graft.  . Infection of AV graft for dialysis     a. Recurrent L thigh infxn s/p revision 10/1, I & D 11/28, and  removal 11/01/2014.  Marland Kitchen Acute decompensated heart failure    Past Surgical History:  Past Surgical History  Procedure Laterality Date  . Right nephrectomy  2001    Renal cell carcinoma  . Colonoscopy  01/24/2011    prominent vascular pattern, suboptimal prep but doable  . Esophagogastroduodenoscopy  01/24/2011    mild erosive reflux esophagitis, bulbar/antral erosions, bx  from antrum benign  . Av fistula repair  2008    revision of anastomosis of Right AVF  . Arteriovenous graft placement  2006    Thrombectomy and interposition jump graft revision to higher  axillary vein of LUA AVG  . Coronary artery bypass graft  1998    X4 VESSELS  . Multiple cysto/ resection tumor's with bx's  LAST ONE  05-09-2011  . Multiple surg's / interventions for avgg/ fistula right upper  arm  LAST REVISION 06-29-2011    (JUNE 2010 STENT CEPHALIC VEIN/ 03-23-2011 DILATATION  ANGIOPLASTY)  .  Left ptc left renal artery    . Cardiac catheterization  2004  . Cataract extraction w/ intraocular lens  implant, bilateral    . Cystoscopy w/ retrogrades  12/28/2011    Procedure: CYSTOSCOPY WITH RETROGRADE PYELOGRAM;  Surgeon: Malka So, MD;  Location: [facility];  Service:  Urology;  Laterality: Left;  C-ARM   . Transurethral resection of bladder tumor  12/28/2011    Procedure: TRANSURETHRAL RESECTION OF BLADDER TUMOR (TURBT);    Surgeon: Malka So, MD;  Location: [facility];   Service: Urology;  Laterality: N/A;  . Av fistula placement  02/29/2012    Procedure: INSERTION OF ARTERIOVENOUS (AV) GORE-TEX GRAFT  THIGH;  Surgeon: Rosetta Posner, MD;  Location: [place];  Service:  Vascular;  Laterality: Right;  Insertion right femoral  arteriovenous gortex graft  . Ligation of right braciocephalic av fistula Right 04-04-2012  . Insertion of dialysis catheter  06/05/2012    Procedure: INSERTION OF DIALYSIS CATHETER;  Surgeon: Mal Misty, MD;  Location: [place];  Service: Vascular;  Laterality:  Left;  Insertion diatek catheter left IJ  . Insertion of dialysis catheter  08/22/2012    Procedure: INSERTION OF DIALYSIS CATHETER;  Surgeon: Serafina Mitchell, MD;  Location: [facility];  Service: Vascular;   Laterality: Left;  insertion of dialysis catheter left  internal  jugular 27cm  . Av fistula placement  10/08/2012    Procedure: INSERTION OF ARTERIOVENOUS (AV) GORE-TEX GRAFT  THIGH;  Surgeon: Angelia Mould, MD;  Location: [place];   Service: Vascular;  Laterality: Left;  . Cystoscopy with biopsy N/A 08/07/2013    Procedure: CYSTOSCOPY WITH BLADDER BIOPSY;  Surgeon: Malka So, MD;  Location: [facility];  Service: Urology;  Laterality:  N/A;  . Fulguration of bladder tumor N/A 08/07/2013    Procedure: FULGURATION OF BLADDER TUMOR;  Surgeon: Malka So, MD;  Location: [facility];  Service: Urology;  Laterality:  N/A;  . Revision of arteriovenous goretex graft Left 08/27/2014    Procedure: THROMBECTOMY & REVISION OF LEFT ARM  ARTERIOVENOUS  GORETEX GRAFT;  Surgeon: Angelia Mould, MD;  Location: [place];  Service: Vascular;  Laterality: Left;  . Revision of arteriovenous goretex graft Left 10/01/2014    Procedure: SEGMENT OF Left THIGH ARTERIOVENOUS GORETEX GRAFT  Removed;  Surgeon: Mal Misty, MD;  Location: [place];   Service: Vascular;  Laterality: Left;  . Removal of graft Left 11/01/2014    Procedure: REMOVAL OF INFECTED  LEFT THIGH GRAFT;  Surgeon:  Serafina Mitchell, MD;  Location: [place] [facility];  Service: Vascular;   Laterality: Left;  removal of infected left dialysis graft.  . Left heart catheterization with coronary/graft angiogram N/A  10/27/2014    Procedure: LEFT HEART CATHETERIZATION WITH Beatrix Fetters;  Surgeon: Leonie Man, MD;  Location: [facility];   Service: Cardiovascular;  Laterality: N/A;  . Tonsillectomy     HPI:  HPI: 81 y/o ? ESRD m/w/f,  H/o CVA, suspect COPD recent admission  11/27/14-11/30/14 Decompensated HF 2/2 to dietary indiscretion  No Data Recorded  Assessment / Plan / Recommendation CHL IP CLINICAL IMPRESSIONS 12/21/2014  Dysphagia Diagnosis (None)  Clinical impression Pt with oral/pharyngeal dysphagia  characterized by delayed oral transit, uncoordinated mastication,  delay in the swallow trigger, valleculae residue and pharyngeal  weakness.  Penetration occured during the swallow impacted by  decreased epiglottic inversion due to sensory  and motor deficits.   Chin tuck was not effective to eliminate penetration with thin  liquid boluses from a cup.  Esophageal sweep revealed it mildly  slow to clear.  Rx begin dysphagia 2 diet with nectar thick  liquids.  Pt to be totally upright for all intake and should not  use straws.        CHL IP TREATMENT RECOMMENDATION 12/21/2014  Treatment Plan Recommendations Therapy as outlined in treatment  plan below     CHL IP DIET RECOMMENDATION 12/21/2014  Diet Recommendations Dysphagia 2 (Fine chop);Nectar-thick liquid  Liquid Administration via Cup;No straw  Medication Administration Whole meds with puree  Compensations Slow rate;Small sips/bites;Check for anterior  loss;Follow solids with liquid  Postural Changes and/or Swallow Maneuvers Out of bed for  meals;Seated upright 90 degrees;Upright 30-60 min after meal     CHL IP OTHER RECOMMENDATIONS 12/21/2014  Recommended Consults (None)  Oral Care Recommendations Oral care Q4 per protocol  Other Recommendations Order  thickener from pharmacy;Prohibited  food (jello, ice cream, thin soups);Remove water pitcher     CHL IP FOLLOW UP RECOMMENDATIONS 12/21/2014  Follow up Recommendations Home health SLP     CHL IP FREQUENCY AND DURATION 12/21/2014  Speech Therapy Frequency (ACUTE ONLY) min 2x/week  Treatment Duration 2 weeks     Pertinent Vitals/Pain None verbalized    SLP Swallow Goals No flowsheet data found.  No flowsheet data found.    CHL IP REASON FOR REFERRAL 12/21/2014  Reason for Referral Objectively evaluate swallowing function     CHL IP ORAL PHASE 12/21/2014  Lips (None)  Tongue (None)  Mucous membranes (None)  Nutritional status (None)  Other (None)  Oxygen therapy (None)  Oral Phase Impaired  Oral - Pudding Teaspoon WFL;Delayed oral transit  Oral - Pudding Cup (None)  Oral - Honey Teaspoon (None)  Oral - Honey Cup (None)  Oral - Honey Syringe (None)  Oral - Nectar Teaspoon (None)  Oral - Nectar Cup Right anterior bolus loss  Oral - Nectar Straw (None)  Oral - Nectar Syringe (None)  Oral - Ice Chips (None)  Oral - Thin Teaspoon Right anterior bolus loss  Oral - Thin Cup Right anterior bolus loss  Oral - Thin Straw (None)  Oral - Thin Syringe (None)  Oral - Puree (None)  Oral - Mechanical Soft (None)  Oral - Regular Weak lingual manipulation;Impaired  mastication;Delayed oral transit  Oral - Multi-consistency (None)  Oral - Pill (None)  Oral Phase - Comment Pt with some tongue base residue for purees  that would spill into the vallecula.        CHL IP PHARYNGEAL PHASE 12/21/2014  Pharyngeal Phase Impaired Pharyngeal - Pudding Teaspoon Delayed  swallow initiation;Reduced epiglottic inversion;Premature  spillage to valleculae;Pharyngeal residue - valleculae  Penetration/Aspiration details (pudding teaspoon) (None)  Pharyngeal - Pudding Cup (None)  Penetration/Aspiration details (pudding cup) (None)  Pharyngeal - Honey Teaspoon (None)  Penetration/Aspiration details (honey teaspoon) (None)  Pharyngeal - Honey Cup (None)   Penetration/Aspiration details (honey cup) (None)  Pharyngeal - Honey Syringe (None)  Penetration/Aspiration details (honey syringe) (None)  Pharyngeal - Nectar Teaspoon (None)  Penetration/Aspiration details (nectar teaspoon) (None)  Pharyngeal - Nectar Cup Delayed swallow initiation;Reduced  epiglottic inversion;Pharyngeal residue - valleculae;Premature  spillage to valleculae  Penetration/Aspiration details (nectar cup) (None)  Pharyngeal - Nectar Straw (None)  Penetration/Aspiration details (nectar straw) (None)  Pharyngeal - Nectar Syringe (None)  Penetration/Aspiration details (nectar syringe) (None)  Pharyngeal - Ice Chips (None)  Penetration/Aspiration details (ice  chips) (None)  Pharyngeal - Thin Teaspoon Delayed swallow initiation;Reduced  epiglottic inversion;Pharyngeal residue - valleculae;Premature  spillage to valleculae;Premature spillage to pyriform sinuses  Penetration/Aspiration details (thin teaspoon) (None)  Pharyngeal - Thin Cup Delayed swallow initiation;Premature  spillage to valleculae;Premature spillage to pyriform  sinuses;Reduced epiglottic inversion;Penetration/Aspiration  during swallow;Trace aspiration;Pharyngeal residue - valleculae  Penetration/Aspiration details (thin cup) Material enters airway,  remains ABOVE vocal cords and not ejected out  Pharyngeal - Thin Straw (None)  Penetration/Aspiration details (thin straw) (None)  Pharyngeal - Thin Syringe (None)  Penetration/Aspiration details (thin syringe') (None)  Pharyngeal - Puree Delayed swallow initiation;Premature spillage  to valleculae;Reduced epiglottic inversion;Pharyngeal residue -  valleculae  Penetration/Aspiration details (puree) (None)  Pharyngeal - Mechanical Soft (None)  Penetration/Aspiration details (mechanical soft) (None)  Pharyngeal - Regular Delayed swallow initiation;Premature  spillage to valleculae;Reduced epiglottic inversion;Pharyngeal  residue - valleculae  Penetration/Aspiration details (regular) (None)   Pharyngeal - Multi-consistency (None)  Penetration/Aspiration details (multi-consistency) (None)  Pharyngeal - Pill (None)  Penetration/Aspiration details (pill) (None)  Pharyngeal Comment (None)     CHL IP CERVICAL ESOPHAGEAL PHASE 12/21/2014  Cervical Esophageal Phase WFL  Pudding Teaspoon (None)  Pudding Cup (None)  Honey Teaspoon (None)  Honey Cup (None)  Honey Syringe (None)  Nectar Teaspoon (None)  Nectar Cup (None)  Nectar Straw (None)  Nectar Syringe (None)  Thin Teaspoon (None)  Thin Cup (None)  Thin Straw (None)  Thin Syringe (None)  Cervical Esophageal Comment (None)    No flowsheet data found.         Lamar Sprinkles 12/21/2014, 2:49 PM  Shelly Flatten, MA, CCC-SLP Acute Rehab SLP [phone]      Assessment/Plan: Diagnosis: Acute on chronic deconditioning secondary to probable pneumonia 1. Does the need for close, 24 hr/day medical supervision in concert with the patient's rehab needs make it unreasonable for this patient to be served in a less intensive setting? Potentially 2. Co-Morbidities requiring supervision/potential complications: End-stage renal disease, aortic stenosis, atrial fibrillation with rapid ventricular response 3. Due to bladder management, bowel management, safety, skin/wound care, disease management, medication administration, pain management and patient education, does the patient require 24 hr/day rehab nursing? Potentially 4. Does the patient require coordinated care of a physician, rehab nurse, PT, OT to address physical and functional deficits in the context of the above medical diagnosis(es)? Potentially Addressing deficits in the following areas: balance, endurance, locomotion, strength, transferring, bowel/bladder control, bathing, dressing, feeding, grooming, toileting and cognition 5. Can the patient actively participate in an intensive therapy program of at least 3 hrs of therapy per day at least 5 days per week? No 6. The potential for patient to make  measurable gains while on inpatient rehab is poor 7. Anticipated functional outcomes upon discharge from inpatient rehab are n/a  with PT, n/a with OT, n/a with SLP. 8. Estimated rehab length of stay to reach the above functional goals is: Not applicable 9. Does the patient have adequate social supports and living environment to accommodate these discharge functional goals? Potentially 10. Anticipated D/C setting: Other 11. Anticipated post D/C treatments: N/A 12. Overall Rehab/Functional Prognosis: poor  RECOMMENDATIONS: This patient's condition is appropriate for continued rehabilitative care in the following setting: SNF Patient has agreed to participate in recommended program. N/A Note that insurance prior authorization may be required for reimbursement for recommended care.  Comment: Patient lacks medical stability for inpatient rehabilitation at current time also do not think he would be able to tolerate intensive care.    12/23/2014

## 2014-12-24 ENCOUNTER — Encounter: Payer: Self-pay | Admitting: Surgery

## 2014-12-24 ENCOUNTER — Inpatient Hospital Stay (HOSPITAL_COMMUNITY): Payer: Medicare Other

## 2014-12-24 LAB — POCT I-STAT 3, ART BLOOD GAS (G3+)
BICARBONATE: 25.3 meq/L — AB (ref 20.0–24.0)
O2 Saturation: 97 %
PH ART: 7.378 (ref 7.350–7.450)
TCO2: 27 mmol/L (ref 0–100)
pCO2 arterial: 42.9 mmHg (ref 35.0–45.0)
pO2, Arterial: 96 mmHg (ref 80.0–100.0)

## 2014-12-24 LAB — HEMOGLOBIN AND HEMATOCRIT, BLOOD
HEMATOCRIT: 25.6 % — AB (ref 39.0–52.0)
HEMOGLOBIN: 8.2 g/dL — AB (ref 13.0–17.0)

## 2014-12-24 MED ORDER — ALBUMIN HUMAN 25 % IV SOLN
INTRAVENOUS | Status: AC
  Filled 2014-12-24: qty 100

## 2014-12-24 MED ORDER — IPRATROPIUM BROMIDE 0.02 % IN SOLN
RESPIRATORY_TRACT | Status: AC
  Filled 2014-12-24: qty 2.5

## 2014-12-24 MED ORDER — ALBUMIN HUMAN 25 % IV SOLN
25.0000 g | Freq: Once | INTRAVENOUS | Status: AC
  Administered 2014-12-24: 25 g via INTRAVENOUS

## 2014-12-24 MED ORDER — IPRATROPIUM BROMIDE 0.02 % IN SOLN
0.5000 mg | Freq: Four times a day (QID) | RESPIRATORY_TRACT | Status: DC
  Administered 2014-12-24 – 2014-12-26 (×7): 0.5 mg via RESPIRATORY_TRACT
  Filled 2014-12-24 (×8): qty 2.5

## 2014-12-24 MED ORDER — VANCOMYCIN HCL IN DEXTROSE 750-5 MG/150ML-% IV SOLN
750.0000 mg | INTRAVENOUS | Status: AC
  Administered 2014-12-24: 750 mg via INTRAVENOUS
  Filled 2014-12-24 (×2): qty 150

## 2014-12-24 MED ORDER — LEVALBUTEROL HCL 1.25 MG/0.5ML IN NEBU
1.2500 mg | INHALATION_SOLUTION | Freq: Four times a day (QID) | RESPIRATORY_TRACT | Status: DC
  Administered 2014-12-24 – 2014-12-26 (×7): 1.25 mg via RESPIRATORY_TRACT
  Filled 2014-12-24 (×12): qty 0.5

## 2014-12-24 NOTE — Progress Notes (Signed)
PROGRESS NOTE  Subjective:    Patient is a 81 y.o. male with a PMHx of ESRD, on hemodialysis, CAD (s/p CABG in 1998. Last cath 09/2014: 99% distal LM; LAD normal LCx normal RCA 100%; LIMA to LAD patent; SVG to OM2/OM3 patent; SVG to PLSA normal LVEF 40 to 45%), anemia, HTN, ESRD, moderate aortic stenosis myelodysplasia, HTN, bladder cancer , COPD, chronic anemia , who was admitted to [facility] on 12/17/2014 for evaluation of dyspnea.  Troponins peaked at 2.42.     Objective:    Vital Signs:   Temp:  [97.1 F (36.2 C)-99.5 F (37.5 C)] 97.1 F (36.2 C) (01/28 0743) Pulse Rate:  [33-116] 94 (01/28 0911) Resp:  [12-37] 31 (01/28 0800) BP: (87-148)/(28-64) 148/64 mmHg (01/28 0911) SpO2:  [79 %-100 %] 100 % (01/28 0800)  Last BM Date: 12/23/14   24-hour weight change: Weight change:   Weight trends: Filed Weights   12/21/14 1559 12/21/14 1810 12/23/14 0713  Weight: 159 lb 6.3 oz (72.3 kg) 159 lb 6.3 oz (72.3 kg) 160 lb 15 oz (73 kg)    Intake/Output:  01/27 0701 - 01/28 0700 In: 692 [P.O.:210; Blood:282; IV Piggyback:200] Out: -1185  Total I/O In: 370 [P.O.:370] Out: -    Physical Exam: BP 148/64 mmHg  Pulse 94  Temp(Src) 97.1 F (36.2 C) (Oral)  Resp 31  Ht 5\' 8"  (1.727 m)  Wt 160 lb 15 oz (73 kg)  BMI 24.48 kg/m2  SpO2 100%  Wt Readings from Last 3 Encounters:  12/23/14 160 lb 15 oz (73 kg)  11/30/14 171 lb 1.2 oz (77.6 kg)  11/24/14 171 lb (77.565 kg)    General: Vital signs reviewed and noted. Chronically ill appearing man   Head: Normocephalic, atraumatic.  Eyes: conjunctivae/corneas clear.  EOM's intact.   Throat: normal  Neck:  normal   Lungs:    bilateral wheezing   Heart:  RR , 2/6 systolic murmur   Abdomen:  Soft, non-tender, non-distended    Extremities: No edema    Neurologic: A&O X3, CN II - XII are grossly intact.   Psych: Normal     Labs: BMET:  Recent Labs  12/22/14 0334 12/23/14 0732  NA 138 136  K 4.5 4.7  CL 99 98    CO2 29 27  GLUCOSE 115* 106*  BUN 37* 56*  CREATININE 5.71* 7.78*  CALCIUM 8.1* 7.8*  PHOS  --  4.3    Liver function tests:  Recent Labs  12/23/14 0732  ALBUMIN 2.6*   No results for input(s): LIPASE, AMYLASE in the last 72 hours.  CBC:  Recent Labs  12/22/14 0334 12/23/14 0732 12/24/14 0923  WBC 3.4* 5.6  --   HGB 7.3* 7.7* 8.2*  HCT 22.5* 23.6* 25.6*  MCV 97.8 96.7  --   PLT 117* 132*  --     Cardiac Enzymes: No results for input(s): CKTOTAL, CKMB, TROPONINI in the last 72 hours.  Coagulation Studies: No results for input(s): LABPROT, INR in the last 72 hours.  Other: Invalid input(s): POCBNP No results for input(s): DDIMER in the last 72 hours. No results for input(s): HGBA1C in the last 72 hours. No results for input(s): CHOL, HDL, LDLCALC, TRIG, CHOLHDL in the last 72 hours. No results for input(s): TSH, T4TOTAL, T3FREE, THYROIDAB in the last 72 hours.  Invalid input(s): FREET3 No results for input(s): VITAMINB12, FOLATE, FERRITIN, TIBC, IRON, RETICCTPCT in the last 72 hours.   Other results:  Tele  :  NSR   Medications:    Infusions:    Scheduled Medications: . sodium chloride   Intravenous Once  . allopurinol  100 mg Oral BID  . antiseptic oral rinse  7 mL Mouth Rinse q12n4p  . aspirin EC  81 mg Oral QHS  . chlorhexidine  15 mL Mouth Rinse BID  . cinacalcet  30 mg Oral Q breakfast  . darbepoetin (ARANESP) injection - DIALYSIS  200 mcg Intravenous Q Wed-HD  . feeding supplement (NEPRO CARB STEADY)  237 mL Oral BID BM  . heparin  5,000 Units Subcutaneous 3 times per day  . ipratropium-albuterol  3 mL Nebulization Q6H  . metoprolol  2.5 mg Intravenous Once  . metoprolol tartrate  12.5 mg Oral BID  . midodrine  10 mg Oral Once per day on Sun Tue Thu Sat  . midodrine  10 mg Oral Once per day on Mon Wed Fri  . midodrine  20 mg Oral Once per day on Mon Wed Fri  . midodrine  20 mg Oral Once per day on Sun Tue Thu Sat  . multivitamin  1  tablet Oral QHS  . piperacillin-tazobactam (ZOSYN)  IV  2.25 g Intravenous 3 times per day  . polyethylene glycol  17 g Oral QHS  . rosuvastatin  20 mg Oral q1800  . sevelamer carbonate  800 mg Oral TID WC  . [START ON 12/25/2014] vancomycin  750 mg Intravenous Q M,W,F-HD    Assessment/ Plan:    1. Atrial fib with RVR: he remains in NSR at this point. Continue current meds.  He is not a candidate for anticoaulation  2. CAD.  His cath in Dec. Showed that his grafts are patent. Troponin levels are minimally elevated.  It appears that he gets into trouble whenever his Hb drops too low .  It may be beneficial to transfuse him at a slightly higher Hb - before he develops these complications  3. ESRD:  On dialysis  4. Chronic anemia:  Further management per int. Med.   5. Aortic stenosis:  Mild .  6 chronic systolic CHF:  EF 88%.  Fairly stable .    Disposition:  Length of Stay: 7  Thayer Headings, Brooke Bonito., MD, [facility] 12/24/2014, 10:47 AM Office [phone] Pager [phone]

## 2014-12-24 NOTE — Consult Note (Signed)
Hospital Consult  Reason for consult: infected thigh graft Consulting physician: Dr. Wallace Keller  HPI: VVS asked to see regarding possibly infected left thigh graft. Patient is well known to [place] having undergone removal of infected nonfunctional left thigh graft by Dr. Trula Slade on 11/01/14. Prior to that he had removal of an infected short segment of non-functional left thigh graft by Dr. Kellie Simmering on 10/01/14.  He was last seen by Dr. Oneida Alar on 11/29/14 during his last admission for suture removal s/p removal of his left thigh graft. At that time, he had a small open wound at the medial aspect to arterial limb of graft. He was to follow up with Dr. Trula Slade on 01/20/2015.  He was admitted to [facility] on 12/17/14 with chest pain with elevated troponins. Yesterday, he had an episode of afib with rvr during his dialysis session and was unable to complete treatment.  He currently denies any chest pain or shortness of breath.   PHYSICAL EXAM: General: very hard of hearing, resting in bed. No acute distress.  Pulmonary: Audible wheezing. No rales. Increased respiratory rate but no increased work of breathing.  Cardiac: nml s1 s2. No m/g/r. Vascular: Dry scab to medial aspect of arterial limb of left thigh graft from where prior nonfunctioning graft was removed. No drainage seen. No erythema, no fluctuance. Bandages removed from venous limb from dialysis cannulation yesterday. Palpable thrill throughout graft, audible bruit. Palpable right DP pulse. Right foot is warm.   ASSESSMENT/PLAN: Patient with ESRD with prior history of infected thigh graft s/p removal 11/01/14. Other active problems: Atrial fibrillation with rvr, aspiration pneumonia HCAP, elevated troponin due to demand ischemia from severe anemia.   His left thigh graft does not appear to be infected. The area where his nonfunctional thigh graft was removed is healing well. There is a dry scab overlying the previous open wound. No drainage was  expressed and there is no surrounding fluctuance or erythema. A wound culture from 12/20/14 revealed MRSA. He is currently on empiric vanc and zosyn for aspiration pneumonia HCAP. His dialysis session was terminated early due to afib with rvr. He is currently in NSR. We will continue to follow. Dr. Bridgett Larsson to evaluate patient.   He was having some audible wheezing during my evaluation with no increased work of breathing but slightly increased respiratory rate. His 02 sats were 100% on 4L with unremarkable ABG.   Virgina Jock, PA-C [facility] Office: [phone] Pager: [phone]  Addendum  I have independently interviewed and examined the patient, and I agree with the physician assistant's findings.  I don't see much evidence of active thigh AVG infection.  WBC 5.6.  Would continue wound care to any open wounds in L thigh.  CBC    Component Value Date/Time   WBC 5.6 12/23/2014 0732   WBC 1.8* 12/15/2014 1112   RBC 2.44* 12/23/2014 0732   RBC 2.40* 12/15/2014 1112   RBC 2.87* 12/24/2012 1710   HGB 8.2* 12/24/2014 0923   HGB 7.6* 12/15/2014 1112   HCT 25.6* 12/24/2014 0923   HCT 24.4* 12/15/2014 1112   PLT 132* 12/23/2014 0732   PLT 122* 12/15/2014 1112   MCV 96.7 12/23/2014 0732   MCV 101.9* 12/15/2014 1112   MCV 102 03/23/2011 0909   MCH 31.6 12/23/2014 0732   MCH 31.9 12/15/2014 1112   MCHC 32.6 12/23/2014 0732   MCHC 31.3* 12/15/2014 1112   RDW 21.7* 12/23/2014 0732   RDW 26.4* 12/15/2014 1112   LYMPHSABS 0.8*  12/15/2014 1112   LYMPHSABS 1.3 11/27/2014 0445   MONOABS 0.3 12/15/2014 1112   MONOABS 0.3 11/27/2014 0445   EOSABS 0.1 12/15/2014 1112   EOSABS 0.1 11/27/2014 0445   BASOSABS 0.0 12/15/2014 1112   BASOSABS 0.0 11/27/2014 0445      Adele Barthel, MD [facility] [place] Office: [phone] Pager: [phone]  12/24/2014, 11:16 AM

## 2014-12-24 NOTE — Progress Notes (Signed)
Patient Demographics  Andrew [name], is a 81 y.o. male, DOB - [birth year], [name]  Admit date - 12/17/2014   Admitting Physician [name], MD  Outpatient Primary MD for the patient is [name]., MD  LOS - 7   Chief Complaint  Patient presents with  . Chest Pain        Subjective:   [name] today has, No headache, No chest pain, No abdominal pain - No Nausea, No new weakness tingling or numbness, No Cough - SOB.    Assessment & Plan   Elevated troponin due to demand ischemia from severe anemia. Patient presented with troponin of 0.5, went up to 2.42, cardiology consulted. Currently on aspirin, beta blocker and statin for secondary prevention. Transfusion to keep hemoglobin above 8. Cards following. No further workup. Chest pain-free    Aspiration pneumonia HCAP He is on dysphagia 2 diet, speech following. Empiric IV vancomycin and Zosyn. Monitor clinically. Follow cultures.   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 Got one more unit on 12/23/2014   Dysphagia Patient seen by SLP and recommended dysphagia 2 with neck are thick liquids.   ESRD Nephrology consulted, undergone dialysis already.Patient tells his days are Monday Wednesday and Friday. Discussed with nephrologist Dr. Jonnie Finner on 12/24/2014.   Chronic combined diastolic and systolic CHF EF 95%. Compensated no acute issue from this standpoint..   New Onset Afib RVR   Mali VAsc 2 score 3 - Cards consulted. Poor candidate for anticoagulation agree with cardiology. Low-dose aspirin and beta blocker as tolerated   L thigh wound  MRSA W culture, on Vanco, VVS called. Per vascular surgery no infection.  Antibiotics for pneumonia being continued.   Dyslipidemia  On statin continue   Code Status: Full  Family Communication: None present  Disposition Plan: SNF   Procedures     Consults    Cardiology, renal, VVS  Medications  Scheduled Meds: . sodium chloride   Intravenous Once  . allopurinol  100 mg Oral BID  . antiseptic oral rinse  7 mL Mouth Rinse q12n4p  . aspirin EC  81 mg Oral QHS  . chlorhexidine  15 mL Mouth Rinse BID  . cinacalcet  30 mg Oral Q breakfast  . darbepoetin (ARANESP) injection - DIALYSIS  200 mcg Intravenous Q Wed-HD  . feeding supplement (NEPRO CARB STEADY)  237 mL Oral BID BM  . heparin  5,000 Units Subcutaneous 3 times per day  . ipratropium-albuterol  3 mL Nebulization Q6H  . metoprolol  2.5 mg Intravenous Once  . metoprolol tartrate  12.5 mg Oral BID  . midodrine  10 mg Oral Once per day on Sun Tue Thu Sat  . midodrine  10 mg Oral Once per day on Mon Wed Fri  . midodrine  20 mg Oral Once per day on Mon Wed Fri  . midodrine  20 mg Oral Once per day on Sun Tue Thu Sat  . multivitamin  1 tablet Oral QHS  . piperacillin-tazobactam (ZOSYN)  IV  2.25 g Intravenous 3 times per day  . polyethylene glycol  17 g Oral QHS  . rosuvastatin  20 mg Oral q1800  . sevelamer carbonate  800 mg Oral TID WC  . [START ON 12/25/2014] vancomycin  750 mg Intravenous Q M,W,F-HD   Continuous Infusions:  PRN Meds:.acetaminophen, albuterol, calcium carbonate, colchicine, ondansetron (ZOFRAN) IV, oxyCODONE-acetaminophen, RESOURCE THICKENUP CLEAR, sevelamer carbonate  DVT Prophylaxis    Heparin   Lab Results  Component Value Date   PLT 132* 12/23/2014    Antibiotics     Anti-infectives    Start     Dose/Rate Route Frequency Ordered Stop   12/25/14 1200  vancomycin (VANCOCIN) IVPB 750 mg/150 ml premix     750 mg150 mL/hr over 60 Minutes Intravenous Every M-W-F (Hemodialysis) 12/23/14 1028     12/23/14 1200  vancomycin (VANCOCIN) 500 mg in sodium chloride 0.9 %  100 mL IVPB     500 mg100 mL/hr over 60 Minutes Intravenous  Once 12/23/14 1028 12/23/14 1427   12/21/14 1200  vancomycin (VANCOCIN) IVPB 750 mg/150 ml premix  Status:  Discontinued     750 mg150 mL/hr over 60 Minutes Intravenous Every M-W-F (Hemodialysis) 12/20/14 1816 12/23/14 1028   12/20/14 2200  piperacillin-tazobactam (ZOSYN) IVPB 2.25 g     2.25 g100 mL/hr over 30 Minutes Intravenous 3 times per day 12/20/14 1816     12/20/14 2000  vancomycin (VANCOCIN) 1,500 mg in sodium chloride 0.9 % 500 mL IVPB     1,500 mg250 mL/hr over 120 Minutes Intravenous  Once 12/20/14 1816 12/20/14 2300   12/20/14 0000  azithromycin (ZITHROMAX Z-PAK) 250 MG tablet        12/20/14 0916            Objective:   Filed Vitals:   12/24/14 0600 12/24/14 0743 12/24/14 0747 12/24/14 0800  BP: 131/57 138/49  148/64  Pulse: 93 88  95  Temp:  97.1 F (36.2 C)    TempSrc:  Oral    Resp: $Remo'29 22  31  'RGmym$ Height:      Weight:      SpO2: 90% 100% 100% 100%    Wt Readings from Last 3 Encounters:  12/23/14 73 kg (160 lb 15 oz)  11/30/14 77.6 kg (171 lb 1.2 oz)  11/24/14 77.565 kg (171 lb)     Intake/Output Summary (Last 24 hours) at 12/24/14 0910 Last data filed at 12/24/14 0606  Gross per 24 hour  Intake    692 ml  Output  -1185 ml  Net   1877 ml     Physical Exam  Awake Alert, Oriented X 3, No new F.N deficits, Normal affect Toco.AT,PERRAL Supple Neck,No JVD, No cervical lymphadenopathy appriciated.  Symmetrical Chest wall movement, Good air movement bilaterally, CTAB RRR,No Gallops,Rubs or new Murmurs, No Parasternal Heave +ve B.Sounds, Abd Soft, No tenderness, No organomegaly appriciated, No rebound - guarding or rigidity. No Cyanosis, Clubbing or edema, No new Rash or bruise, L thigh graft site appears stable   Data Review   Micro Results Recent Results (from the past 240 hour(s))  Wound culture     Status: None   Collection Time: 12/20/14  6:20 PM  Result Value Ref Range Status    Specimen Description WOUND LEFT THIGH GRAFT  Final   Special Requests NONE  Final   Gram Stain   Final    NO WBC SEEN FEW SQUAMOUS EPITHELIAL CELLS PRESENT ABUNDANT GRAM POSITIVE COCCI IN PAIRS Performed at [facility]    Culture   Final    ABUNDANT METHICILLIN RESISTANT STAPHYLOCOCCUS AUREUS Note: RIFAMPIN AND GENTAMICIN SHOULD NOT BE USED AS SINGLE DRUGS FOR TREATMENT OF STAPH  INFECTIONS. This organism DOES NOT demonstrate inducible Clindamycin resistance in vitro. CRITICAL RESULT CALLED TO, READ BACK BY AND VERIFIED WITH: J CLEAVER RN  12/23/14 AT 33 AM BY [facility] Performed at [facility]    Report Status 12/23/2014 FINAL  Final   Organism ID, Bacteria METHICILLIN RESISTANT STAPHYLOCOCCUS AUREUS  Final      Susceptibility   Methicillin resistant staphylococcus aureus - MIC*    CLINDAMYCIN <=0.25 SENSITIVE Sensitive     ERYTHROMYCIN >=8 RESISTANT Resistant     GENTAMICIN <=0.5 SENSITIVE Sensitive     LEVOFLOXACIN >=8 RESISTANT Resistant     OXACILLIN >=4 RESISTANT Resistant     PENICILLIN >=0.5 RESISTANT Resistant     RIFAMPIN <=0.5 SENSITIVE Sensitive     TRIMETH/SULFA <=10 SENSITIVE Sensitive     VANCOMYCIN 1 SENSITIVE Sensitive     TETRACYCLINE <=1 SENSITIVE Sensitive     * ABUNDANT METHICILLIN RESISTANT STAPHYLOCOCCUS AUREUS  Culture, blood (routine x 2)     Status: None (Preliminary result)   Collection Time: 12/20/14  7:20 PM  Result Value Ref Range Status   Specimen Description BLOOD LEFT HAND  Final   Special Requests BOTTLES DRAWN AEROBIC ONLY [place]  Final   Culture   Final           BLOOD CULTURE RECEIVED NO GROWTH TO DATE CULTURE WILL BE HELD FOR 5 DAYS BEFORE ISSUING A FINAL NEGATIVE REPORT Performed at [facility]    Report Status PENDING  Incomplete  Culture, blood (routine x 2)     Status: None (Preliminary result)   Collection Time: 12/20/14  7:55 PM  Result Value Ref Range Status   Specimen Description BLOOD LEFT THIGH  Final    Special Requests BOTTLES DRAWN AEROBIC AND ANAEROBIC 10 CC EA  Final   Culture   Final           BLOOD CULTURE RECEIVED NO GROWTH TO DATE CULTURE WILL BE HELD FOR 5 DAYS BEFORE ISSUING A FINAL NEGATIVE REPORT Performed at [facility]    Report Status PENDING  Incomplete    Radiology Reports Dg Chest 2 View  12/17/2014   CLINICAL DATA:  Chest pain last night. The pain was relieved with sublingual nitroglycerin.  EXAM: CHEST  2 VIEW  COMPARISON:  11/27/2014, 11/18/2014, 11/07/2014  FINDINGS: There is prior sternotomy and CABG. There is a right axillary-subclavian vascular stent. There are surgical clips in the left axilla.  There is no airspace consolidation or alveolar edema. There is mild vascular and interstitial thickening consistent with a minor degree of congestive failure. This is not as severe as on 11/27/2014. There is a tiny left pleural effusion. This is smaller than on 12/15/2014. Hilar, mediastinal and cardiac contours appear unremarkable. There is atherosclerotic calcification in the aorta.  IMPRESSION: Findings consistent with a mild degree of congestive failure with a very small left pleural effusion.   Electronically Signed   By: Andreas Newport M.D.   On: 12/17/2014 12:35   Dg Chest 2 View  11/27/2014   CLINICAL DATA:  Shortness of breath and chest pain  EXAM: CHEST  2 VIEW  COMPARISON:  11/18/2014  FINDINGS: Small bilateral pleural effusions. No overt pulmonary edema. Mild cardiomegaly that is stable from prior. The aorta and hila are negative. The patient is status post CABG. Surgical changes to the left axilla. Prior right subclavian stenting.  IMPRESSION: Pulmonary venous congestion with small pleural effusions.   Electronically Signed   By: Gilford Silvius.D.  On: 11/27/2014 06:16   Dg Chest Port 1 View  12/23/2014   CLINICAL DATA:  Shortness of breath.  EXAM: PORTABLE CHEST - 1 VIEW  COMPARISON:  December 21, 2014.  FINDINGS: Stable cardiomediastinal silhouette.  Status post coronary artery bypass graft. No pneumothorax or pleural effusion is noted. No acute pulmonary disease is noted. Bony thorax appears intact.  IMPRESSION: No acute cardiopulmonary abnormality seen.   Electronically Signed   By: Sabino Dick M.D.   On: 12/23/2014 12:11   Dg Chest Port 1 View  12/21/2014   CLINICAL DATA:  Aspiration.  EXAM: PORTABLE CHEST - 1 VIEW  COMPARISON:  12/20/2014.  FINDINGS: Mediastinum and hilar structures normal. Prior CABG. Stable cardiomegaly and pulmonary vascular congestion with stable mild interstitial prominence. These findings suggest congestive heart failure. Pneumonitis project in the right lung base cannot be excluded. No pleural effusion or pneumothorax. Right subclavian vascular stents are noted. The stents are in and angled position.  IMPRESSION: 1. Prior CABG. Persistent changes of mild congestive heart failure. Pneumonitis cannot be excluded, particularly in the right lung base. No interim change from prior exam . 2. Right subclavian vascular stents are noted. They are in an angled position.   Electronically Signed   By: Marcello Moores  Register   On: 12/21/2014 07:32   Dg Chest Port 1 View  12/20/2014   CLINICAL DATA:  Chest pain and shortness of Breath  EXAM: PORTABLE CHEST - 1 VIEW  COMPARISON:  12/17/2014  FINDINGS: The heart size is mildly enlarged. Atherosclerotic calcification noted within the aortic arch. Previous median sternotomy and CABG procedure. Moderate pulmonary vascular congestion is noted. No airspace consolidation.  IMPRESSION: Mild cardiac enlargement and pulmonary vascular congestion   Electronically Signed   By: Kerby Moors M.D.   On: 12/20/2014 11:29   Dg Swallowing Func-speech Pathology  12/21/2014   Lamar Sprinkles, CCC-SLP     12/21/2014  2:51 PM  Objective Swallowing Evaluation: Modified Barium Swallowing Study   Patient Details  Name: FRANCISZEK PLATTEN MRN: [id number] Date of Birth: Nov 17, 1926  Today's Date: 12/21/2014 Time: SLP Start  Time (ACUTE ONLY): 0135-SLP Stop Time (ACUTE  ONLY): 0200 SLP Time Calculation (min) (ACUTE ONLY): 25 min  Past Medical History:  Past Medical History  Diagnosis Date  . ESRD on hemodialysis     Hemodialysis since 2003; Occluded access in both upper  extremities and the right lower extremity, current access as of  Aug 2014 is L thigh AVG. Gets HD MWF at [facility].     . Duodenal ulcer     remote  . Chronic combined systolic and diastolic CHF (congestive heart  failure)     a. 02/2012 Echo: EF 65%, basal infolat AK, mild conc LVH;  b.  06/2013 Echo: EF 40-45%, mild LVH, Gr2 DD, basal inf HK->AK, Mod  AS, Mild MR, PASP 73;  c. 09/2014 Echo: EF 45% basal-mid inflat  and inf AK, Gr 2DD, mod AS.  Marland Kitchen Arteriosclerotic cardiovascular disease (ASCVD) 1998    a. [id number]; b. 06/2003 Cath: 4/4 patent grafts, EF 55%;  c.  09/2014 Cath: LM 99d, LADnl, LCX nl, RCA 100p, LIMA->LAD nl,  VG->OM2->OM3 nl, VG->RPL nl, EF 40-45%.  . Cerebrovascular disease   . Neuropathy of foot   . Degenerative joint disease   . Gastroesophageal reflux disease   . Impaired hearing     Bilateral; hearing aids relatively ineffective  . Hypertension   . Cholelithiasis 03/2011    Diagnosed incidentally on abdominal ultrasound  in 03/2011  . Hepatic steatosis   . Macrocytosis 10/20/2012  . Thrombocytopenia 10/20/2012  . Hyperlipidemia   . Secondary hyperparathyroidism   . Peripheral vascular disease   . Gout   . Moderate aortic stenosis     a. 06/2013 Echo: Mod AS, mean grad: 25, peak grad: 57;  b.  09/2014 Echo: EF 45%, Gr 2 DD, Mod AS, valve area 1.15cm^2 (VTI),  1.1cm^2 (Vmax).  . Pulmonary hypertension     a. 06/2013 Echo: PASP 49mmHg.  Marland Kitchen Anemia     Prior blood transfusion  . Complication of anesthesia     daughter reports long episodes confusion after amnesia meds  . Renal cell carcinoma 2001    s/p right nephrectomy-2001; subsequent ESRD  . 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 Hypotension   . Pancytopenia   . MRSA  infection     a. 08/2014->10/2014 L thigh dialysis graft.  . Infection of AV graft for dialysis     a. Recurrent L thigh infxn s/p revision 10/1, I & D 11/28, and  removal 11/01/2014.  Marland Kitchen Acute decompensated heart failure    Past Surgical History:  Past Surgical History  Procedure Laterality Date  . Right nephrectomy  2001    Renal cell carcinoma  . Colonoscopy  01/24/2011    prominent vascular pattern, suboptimal prep but doable  . Esophagogastroduodenoscopy  01/24/2011    mild erosive reflux esophagitis, bulbar/antral erosions, bx  from antrum benign  . Av fistula repair  2008    revision of anastomosis of Right AVF  . Arteriovenous graft placement  2006    Thrombectomy and interposition jump graft revision to higher  axillary vein of LUA AVG  . Coronary artery bypass graft  1998    X4 VESSELS  . Multiple cysto/ resection tumor's with bx's  LAST ONE  05-09-2011  . Multiple surg's / interventions for avgg/ fistula right upper  arm  LAST REVISION 06-29-2011    (JUNE [id number] [place] VEIN/ [id number] DILATATION  ANGIOPLASTY)  . Left ptc left renal artery    . Cardiac catheterization  2004  . Cataract extraction w/ intraocular lens  implant, bilateral    . Cystoscopy w/ retrogrades  12/28/2011    Procedure: CYSTOSCOPY WITH RETROGRADE PYELOGRAM;  Surgeon: Malka So, MD;  [facility]  Service:  Urology;  Laterality: Left;  C-ARM   . Transurethral resection of bladder tumor  12/28/2011    Procedure: TRANSURETHRAL RESECTION OF BLADDER TUMOR (TURBT);   Surgeon: Malka So, MD;  Location: [facility];   Service: Urology;  Laterality: N/A;  . Av fistula placement  02/29/2012    Procedure: INSERTION OF ARTERIOVENOUS (AV) GORE-TEX GRAFT  THIGH;  Surgeon: Rosetta Posner, MD;  Location: [place];  Service:  Vascular;  Laterality: Right;  Insertion right femoral  arteriovenous gortex graft  . Ligation of right braciocephalic av fistula Right 04-04-2012  . Insertion of dialysis catheter   06/05/2012    Procedure: INSERTION OF DIALYSIS CATHETER;  Surgeon: Mal Misty, MD;  Location: [place];  Service: Vascular;  Laterality:  Left;  Insertion diatek catheter left IJ  . Insertion of dialysis catheter  08/22/2012    Procedure: INSERTION OF DIALYSIS CATHETER;  Surgeon: Serafina Mitchell, MD;  Location: [facility];  Service: Vascular;   Laterality: Left;  insertion of dialysis catheter left  internal  jugular 27cm  . Av fistula placement  10/08/2012  Procedure: INSERTION OF ARTERIOVENOUS (AV) GORE-TEX GRAFT  THIGH;  Surgeon: Angelia Mould, MD;  Location: [place];   Service: Vascular;  Laterality: Left;  . Cystoscopy with biopsy N/A 08/07/2013    Procedure: CYSTOSCOPY WITH BLADDER BIOPSY;  Surgeon: Malka So, MD;  Location: [facility];  Service: Urology;  Laterality:  N/A;  . Fulguration of bladder tumor N/A 08/07/2013    Procedure: FULGURATION OF BLADDER TUMOR;  Surgeon: Malka So, MD;  Location: [facility];  Service: Urology;  Laterality:  N/A;  . Revision of arteriovenous goretex graft Left 08/27/2014    Procedure: THROMBECTOMY & REVISION OF LEFT ARM  ARTERIOVENOUS  GORETEX GRAFT;  Surgeon: Angelia Mould, MD;  Location: [place];  Service: Vascular;  Laterality: Left;  . Revision of arteriovenous goretex graft Left 10/01/2014    Procedure: SEGMENT OF Left THIGH ARTERIOVENOUS GORETEX GRAFT  Removed;  Surgeon: Mal Misty, MD;  Location: [place];   Service: Vascular;  Laterality: Left;  . Removal of graft Left 11/01/2014    Procedure: REMOVAL OF INFECTED LEFT THIGH GRAFT;  Surgeon:  Serafina Mitchell, MD;  Location: [facility];  Service: Vascular;   Laterality: Left;  removal of infected left dialysis graft.  . Left heart catheterization with coronary/graft angiogram N/A  10/27/2014    Procedure: LEFT HEART CATHETERIZATION WITH Beatrix Fetters;  Surgeon: Leonie Man, MD;  Location: [facility] C[facility];   Service: Cardiovascular;  Laterality: N/A;  . Tonsillectomy     HPI:  HPI: 81 y/o ? ESRD  m/w/f,  H/o CVA, suspect COPD recent admission  11/27/14-11/30/14 Decompensated HF 2/2 to dietary indiscretion  No Data Recorded  Assessment / Plan / Recommendation CHL IP CLINICAL IMPRESSIONS 12/21/2014  Dysphagia Diagnosis (None)  Clinical impression Pt with oral/pharyngeal dysphagia  characterized by delayed oral transit, uncoordinated mastication,  delay in the swallow trigger, valleculae residue and pharyngeal  weakness.  Penetration occured during the swallow impacted by  decreased epiglottic inversion due to sensory and motor deficits.   Chin tuck was not effective to eliminate penetration with thin  liquid boluses from a cup.  Esophageal sweep revealed it mildly  slow to clear.  Rx begin dysphagia 2 diet with nectar thick  liquids.  Pt to be totally upright for all intake and should not  use straws.        CHL IP TREATMENT RECOMMENDATION 12/21/2014  Treatment Plan Recommendations Therapy as outlined in treatment  plan below     CHL IP DIET RECOMMENDATION 12/21/2014  Diet Recommendations Dysphagia 2 (Fine chop);Nectar-thick liquid  Liquid Administration via Cup;No straw  Medication Administration Whole meds with puree  Compensations Slow rate;Small sips/bites;Check for anterior  loss;Follow solids with liquid  Postural Changes and/or Swallow Maneuvers Out of bed for  meals;Seated upright 90 degrees;Upright 30-60 min after meal     CHL IP OTHER RECOMMENDATIONS 12/21/2014  Recommended Consults (None)  Oral Care Recommendations Oral care Q4 per protocol  Other Recommendations Order thickener from pharmacy;Prohibited  food (jello, ice cream, thin soups);Remove water pitcher     CHL IP FOLLOW UP RECOMMENDATIONS 12/21/2014  Follow up Recommendations Home health SLP     CHL IP FREQUENCY AND DURATION 12/21/2014  Speech Therapy Frequency (ACUTE ONLY) min 2x/week  Treatment Duration 2 weeks     Pertinent Vitals/Pain None verbalized    SLP Swallow Goals No flowsheet data found.  No flowsheet data found.    CHL IP REASON FOR  REFERRAL 12/21/2014  Reason for Referral Objectively evaluate swallowing function     CHL IP ORAL PHASE 12/21/2014  Lips (None)  Tongue (None)  Mucous membranes (None)  Nutritional status (None)  Other (None)  Oxygen therapy (None)  Oral Phase Impaired  Oral - Pudding Teaspoon WFL;Delayed oral transit  Oral - Pudding Cup (None)  Oral - Honey Teaspoon (None)  Oral - Honey Cup (None)  Oral - Honey Syringe (None)  Oral - Nectar Teaspoon (None)  Oral - Nectar Cup Right anterior bolus loss  Oral - Nectar Straw (None)  Oral - Nectar Syringe (None)  Oral - Ice Chips (None)  Oral - Thin Teaspoon Right anterior bolus loss  Oral - Thin Cup Right anterior bolus loss  Oral - Thin Straw (None)  Oral - Thin Syringe (None)  Oral - Puree (None)  Oral - Mechanical Soft (None)  Oral - Regular Weak lingual manipulation;Impaired  mastication;Delayed oral transit  Oral - Multi-consistency (None)  Oral - Pill (None)  Oral Phase - Comment Pt with some tongue base residue for purees  that would spill into the vallecula.        CHL IP PHARYNGEAL PHASE 12/21/2014  Pharyngeal Phase Impaired Pharyngeal - Pudding Teaspoon Delayed  swallow initiation;Reduced epiglottic inversion;Premature  spillage to valleculae;Pharyngeal residue - valleculae  Penetration/Aspiration details (pudding teaspoon) (None)  Pharyngeal - Pudding Cup (None)  Penetration/Aspiration details (pudding cup) (None)  Pharyngeal - Honey Teaspoon (None)  Penetration/Aspiration details (honey teaspoon) (None)  Pharyngeal - Honey Cup (None)  Penetration/Aspiration details (honey cup) (None)  Pharyngeal - Honey Syringe (None)  Penetration/Aspiration details (honey syringe) (None)  Pharyngeal - Nectar Teaspoon (None)  Penetration/Aspiration details (nectar teaspoon) (None)  Pharyngeal - Nectar Cup Delayed swallow initiation;Reduced  epiglottic inversion;Pharyngeal residue - valleculae;Premature  spillage to valleculae  Penetration/Aspiration details (nectar cup) (None)  Pharyngeal -  Nectar Straw (None)  Penetration/Aspiration details (nectar straw) (None)  Pharyngeal - Nectar Syringe (None)  Penetration/Aspiration details (nectar syringe) (None)  Pharyngeal - Ice Chips (None)  Penetration/Aspiration details (ice chips) (None)  Pharyngeal - Thin Teaspoon Delayed swallow initiation;Reduced  epiglottic inversion;Pharyngeal residue - valleculae;Premature  spillage to valleculae;Premature spillage to pyriform sinuses  Penetration/Aspiration details (thin teaspoon) (None)  Pharyngeal - Thin Cup Delayed swallow initiation;Premature  spillage to valleculae;Premature spillage to pyriform  sinuses;Reduced epiglottic inversion;Penetration/Aspiration  during swallow;Trace aspiration;Pharyngeal residue - valleculae  Penetration/Aspiration details (thin cup) Material enters airway,  remains ABOVE vocal cords and not ejected out  Pharyngeal - Thin Straw (None)  Penetration/Aspiration details (thin straw) (None)  Pharyngeal - Thin Syringe (None)  Penetration/Aspiration details (thin syringe') (None)  Pharyngeal - Puree Delayed swallow initiation;Premature spillage  to valleculae;Reduced epiglottic inversion;Pharyngeal residue -  valleculae  Penetration/Aspiration details (puree) (None)  Pharyngeal - Mechanical Soft (None)  Penetration/Aspiration details (mechanical soft) (None)  Pharyngeal - Regular Delayed swallow initiation;Premature  spillage to valleculae;Reduced epiglottic inversion;Pharyngeal  residue - valleculae  Penetration/Aspiration details (regular) (None)  Pharyngeal - Multi-consistency (None)  Penetration/Aspiration details (multi-consistency) (None)  Pharyngeal - Pill (None)  Penetration/Aspiration details (pill) (None)  Pharyngeal Comment (None)     CHL IP CERVICAL ESOPHAGEAL PHASE 12/21/2014  Cervical Esophageal Phase WFL  Pudding Teaspoon (None)  Pudding Cup (None)  Honey Teaspoon (None)  Honey Cup (None)  Honey Syringe (None)  Nectar Teaspoon (None)  Nectar Cup (None)  Nectar Straw (None)   Nectar Syringe (None)  Thin Teaspoon (None)  Thin Cup (None)  Thin Straw (None)  Thin Syringe (None)  Cervical Esophageal Comment (None)    No  flowsheet data found.         Lamar Sprinkles 12/21/2014, 2:49 PM  Shelly Flatten, MA, CCC-SLP Acute Rehab SLP [phone]       CBC  Recent Labs Lab 12/19/14 [phone] 12/20/14 0530 12/21/14 0241 12/22/14 0334 12/23/14 0732  WBC 3.7* 3.9* 3.5* 3.4* 5.6  HGB 8.3* 9.2* 8.0* 7.3* 7.7*  HCT 25.4* 28.4* 25.2* 22.5* 23.6*  PLT 105* 140* 121* 117* 132*  MCV 97.7 97.6 97.7 97.8 96.7  MCH 31.9 31.6 31.0 31.7 31.6  MCHC 32.7 32.4 31.7 32.4 32.6  RDW 22.8* 22.1* 21.8* 21.5* 21.7*    Chemistries   Recent Labs Lab 12/18/14 1124 12/19/14 0419 12/20/14 0530 12/21/14 0241 12/22/14 0334 12/23/14 0732  NA 138 136 134* 138 138 136  K 3.8 4.2 4.9 4.7 4.5 4.7  CL 99 97 94* 97 99 98  CO2 32 $Remo'25 27 28 29 27  'eXGhx$ GLUCOSE 102* 98 86 89 115* 106*  BUN 9 23 46* 44* 37* 56*  CREATININE 2.96* 4.70* 7.21* 6.93* 5.71* 7.78*  CALCIUM 8.3* 8.0* 7.8* 8.5 8.1* 7.8*  AST 41*  --   --   --   --   --   ALT 25  --   --   --   --   --   ALKPHOS 141*  --   --   --   --   --   BILITOT 0.8  --   --   --   --   --    ------------------------------------------------------------------------------------------------------------------ estimated creatinine clearance is 6.6 mL/min (by C-G formula based on Cr of 7.78). ------------------------------------------------------------------------------------------------------------------ No results for input(s): HGBA1C in the last 72 hours. ------------------------------------------------------------------------------------------------------------------ No results for input(s): CHOL, HDL, LDLCALC, TRIG, CHOLHDL, LDLDIRECT in the last 72 hours. ------------------------------------------------------------------------------------------------------------------ No results for input(s): TSH, T4TOTAL, T3FREE, THYROIDAB in the last 72  hours.  Invalid input(s): FREET3 ------------------------------------------------------------------------------------------------------------------ No results for input(s): VITAMINB12, FOLATE, FERRITIN, TIBC, IRON, RETICCTPCT in the last 72 hours.  Coagulation profile  Recent Labs Lab 12/18/14 0339  INR 1.05    No results for input(s): DDIMER in the last 72 hours.  Cardiac Enzymes  Recent Labs Lab 12/17/14 1630 12/17/14 2101 12/18/14 1124  TROPONINI 2.12* 2.42* 1.92*   ------------------------------------------------------------------------------------------------------------------ Invalid input(s): POCBNP     Time Spent in minutes   35   SINGH,PRASHANT K M.D on 12/24/2014 at 9:10 AM  Between 7am to 7pm - Pager - [phone]  After 7pm go to [email or web address] - [facility] Office  [phone]

## 2014-12-24 NOTE — Progress Notes (Signed)
S/p removal of infected left thigh AVGG.  Wound left open, and patient has been undergoing wet to dry dressing changes.  There was some oozing from the incision over the weekend.  He remains on vancomycin.  He has a dry scab over the graft excision site.  There is no active infection, as there is no erythema and the skin is normothermic.  Agree with 2 more weeks of Abx    Wells Brabham

## 2014-12-24 NOTE — Progress Notes (Signed)
ANTIBIOTIC CONSULT NOTE - FOLLOW UP  Pharmacy Consult for vancomycin, zosyn Indication: PNA  No Known Allergies  Patient Measurements: Height: 5\' 8"  (172.7 cm) Weight: 160 lb 15 oz (73 kg) IBW/kg (Calculated) : 68.4  Vital Signs: Temp: 97.1 F (36.2 C) (01/28 0743) Temp Source: Oral (01/28 0743) BP: 148/64 mmHg (01/28 0911) Pulse Rate: 94 (01/28 0911) Intake/Output from previous day: 01/27 0701 - 01/28 0700 In: 692 [P.O.:210; Blood:282; IV Piggyback:200] Out: -1185  Intake/Output from this shift: Total I/O In: 370 [P.O.:370] Out: -   Labs:  Recent Labs  12/22/14 0334 12/23/14 0732 12/24/14 0923  WBC 3.4* 5.6  --   HGB 7.3* 7.7* 8.2*  PLT 117* 132*  --   CREATININE 5.71* 7.78*  --    Estimated Creatinine Clearance: 6.6 mL/min (by C-G formula based on Cr of 7.78). No results for input(s): VANCOTROUGH, VANCOPEAK, VANCORANDOM, GENTTROUGH, GENTPEAK, GENTRANDOM, TOBRATROUGH, TOBRAPEAK, TOBRARND, AMIKACINPEAK, AMIKACINTROU, AMIKACIN in the last 72 hours.   Microbiology: Recent Results (from the past 720 hour(s))  MRSA PCR Screening     Status: None   Collection Time: 11/27/14 11:12 AM  Result Value Ref Range Status   MRSA by PCR NEGATIVE NEGATIVE Final    Comment:        The GeneXpert MRSA Assay (FDA approved for NASAL specimens only), is one component of a comprehensive MRSA colonization surveillance program. It is not intended to diagnose MRSA infection nor to guide or monitor treatment for MRSA infections.   Wound culture     Status: None   Collection Time: 12/20/14  6:20 PM  Result Value Ref Range Status   Specimen Description WOUND LEFT THIGH GRAFT  Final   Special Requests NONE  Final   Gram Stain   Final    NO WBC SEEN FEW SQUAMOUS EPITHELIAL CELLS PRESENT ABUNDANT GRAM POSITIVE COCCI IN PAIRS Performed at [facility]    Culture   Final    ABUNDANT METHICILLIN RESISTANT STAPHYLOCOCCUS AUREUS Note: RIFAMPIN AND GENTAMICIN SHOULD NOT BE  USED AS SINGLE DRUGS FOR TREATMENT OF STAPH INFECTIONS. This organism DOES NOT demonstrate inducible Clindamycin resistance in vitro. CRITICAL RESULT CALLED TO, READ BACK BY AND VERIFIED WITH: J CLEAVER RN  12/23/14 AT 28 AM BY [facility] Performed at [facility]    Report Status 12/23/2014 FINAL  Final   Organism ID, Bacteria METHICILLIN RESISTANT STAPHYLOCOCCUS AUREUS  Final      Susceptibility   Methicillin resistant staphylococcus aureus - MIC*    CLINDAMYCIN <=0.25 SENSITIVE Sensitive     ERYTHROMYCIN >=8 RESISTANT Resistant     GENTAMICIN <=0.5 SENSITIVE Sensitive     LEVOFLOXACIN >=8 RESISTANT Resistant     OXACILLIN >=4 RESISTANT Resistant     PENICILLIN >=0.5 RESISTANT Resistant     RIFAMPIN <=0.5 SENSITIVE Sensitive     TRIMETH/SULFA <=10 SENSITIVE Sensitive     VANCOMYCIN 1 SENSITIVE Sensitive     TETRACYCLINE <=1 SENSITIVE Sensitive     * ABUNDANT METHICILLIN RESISTANT STAPHYLOCOCCUS AUREUS  Culture, blood (routine x 2)     Status: None (Preliminary result)   Collection Time: 12/20/14  7:20 PM  Result Value Ref Range Status   Specimen Description BLOOD LEFT HAND  Final   Special Requests BOTTLES DRAWN AEROBIC ONLY [place]  Final   Culture   Final           BLOOD CULTURE RECEIVED NO GROWTH TO DATE CULTURE WILL BE HELD FOR 5 DAYS BEFORE ISSUING A FINAL NEGATIVE REPORT Performed  at [facility]    Report Status PENDING  Incomplete  Culture, blood (routine x 2)     Status: None (Preliminary result)   Collection Time: 12/20/14  7:55 PM  Result Value Ref Range Status   Specimen Description BLOOD LEFT THIGH  Final   Special Requests BOTTLES DRAWN AEROBIC AND ANAEROBIC 10 CC EA  Final   Culture   Final           BLOOD CULTURE RECEIVED NO GROWTH TO DATE CULTURE WILL BE HELD FOR 5 DAYS BEFORE ISSUING A FINAL NEGATIVE REPORT Performed at [facility]    Report Status PENDING  Incomplete    Anti-infectives    Start     Dose/Rate Route Frequency Ordered Stop    12/25/14 1200  vancomycin (VANCOCIN) IVPB 750 mg/150 ml premix     750 mg150 mL/hr over 60 Minutes Intravenous Every M-W-F (Hemodialysis) 12/23/14 1028     12/23/14 1200  vancomycin (VANCOCIN) 500 mg in sodium chloride 0.9 % 100 mL IVPB     500 mg100 mL/hr over 60 Minutes Intravenous  Once 12/23/14 1028 12/23/14 1427   12/21/14 1200  vancomycin (VANCOCIN) IVPB 750 mg/150 ml premix  Status:  Discontinued     750 mg150 mL/hr over 60 Minutes Intravenous Every M-W-F (Hemodialysis) 12/20/14 1816 12/23/14 1028   12/20/14 2200  piperacillin-tazobactam (ZOSYN) IVPB 2.25 g     2.25 g100 mL/hr over 30 Minutes Intravenous 3 times per day 12/20/14 1816     12/20/14 2000  vancomycin (VANCOCIN) 1,500 mg in sodium chloride 0.9 % 500 mL IVPB     1,500 mg250 mL/hr over 120 Minutes Intravenous  Once 12/20/14 1816 12/20/14 2300   12/20/14 0000  azithromycin (ZITHROMAX Z-PAK) 250 MG tablet        12/20/14 2774        Assessment: 81 yo male with possible aspiration PNA on vancomycin and zosyn (noted with hx MRSA AVG infection.  He is noted with ESRD and HD today 1/28.    Per [facility] no active infection from thigh wound perspective and they agree with two more weeks of antibiotics with coverage currently to cover pneumonia.   Low dose given 1/27 d/t limited HD performed. Patient appears to be tolerating session well today, will continue with standard dosing. Will consider check pre-hd level prior to next session.  1/24 vanc>> 1/24 zosyn>>  1/24 left thigh graft>> MRSA 1/24 BC X 2>>NGTD  Goal of Therapy:  Pre-HD vancomycin trough 15-25  Plan:  -vancomycin 750mg  with HD today -No zosyn changes needed -Will follow  cultures and clinical progress  Erin Hearing PharmD., BCPS Clinical Pharmacist Pager [phone] 12/24/2014 3:38 PM

## 2014-12-24 NOTE — Progress Notes (Signed)
[facility] Progress Note   Subjective: has no complaints  Filed Vitals:   12/24/14 0743 12/24/14 0747 12/24/14 0800 12/24/14 0911  BP: 138/49  148/64 148/64  Pulse: 88  95 94  Temp: 97.1 F (36.2 C)     TempSrc: Oral     Resp: 22  31   Height:      Weight:      SpO2: 100% 100% 100%    Exam: Up in chair No jvd Chest bibasilar crackles and soft exp wheezes RRR 2/6 M at LUSB Abd soft, NTND No LE or UE edema Left thigh AVG +bruit, healing wound lateral aspect, no drainage at this time Neuro awake, responsive  HD: MWF [facility] 4h   74.5kg   2K/3.5 Ca [place]    Heparin 1500   L fem AVG Hect 3 ug, Aranesp 200 / wk, Venofer none Lab: Hb 7.7, tfs 37%, phos 3.4, Ca 8.2, alb 3.3        Assessment: 1. Asp PNA - on vanc/ zosyn 2. Afib with RVR - back in NSR now, on low dose MTP  3. ESRD on HD MWF 4. Anemia d/t CKD/ MDS - requires tranfusion frequently, max esa, transfused one unit yest, Hb 8.2 today 5. MBD - cont meds 6. Chest pain on admission / hx CABG - demand ischemia per cards, last cath 11/15 w patent grafts 7. HTN/vol - below dry wt, no vol excess 8. 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 Wound drainage L thigh - small amount, cx + MRSA, is on Vanc for #1, drainage resolved >  have d/w [facility]VS, ok to use the new lateral limb, will use tomorrow  Plan- HD tomorrow, use new lateral limb with one needle there and one medial limb    Kelly Splinter MD  pager [phone]    cell [phone]  12/24/2014, 10:18 AM     Recent Labs Lab 12/18/14 0250  12/21/14 0241 12/22/14 0334 12/23/14 0732  NA 138  < > 138 138 136  K 4.4  < > 4.7 4.5 4.7  CL 98  < > 97 99 98  CO2 31  < > 28 29 27   GLUCOSE 92  < > 89 115* 106*  BUN 28*  < > 44* 37* 56*  CREATININE 5.79*  < > 6.93* 5.71* 7.78*  CALCIUM 8.0*  < > 8.5 8.1* 7.8*  PHOS 2.6  --   --   --  4.3  < > = values in this interval not displayed.  Recent Labs Lab 12/18/14 0250 12/18/14 1124  12/23/14 0732  AST  --  41*  --   ALT  --  25  --   ALKPHOS  --  141*  --   BILITOT  --  0.8  --   PROT  --  6.4  --   ALBUMIN 3.1* 3.2* 2.6*    Recent Labs Lab 12/21/14 0241 12/22/14 0334 12/23/14 0732 12/24/14 0923  WBC 3.5* 3.4* 5.6  --   HGB 8.0* 7.3* 7.7* 8.2*  HCT 25.2* 22.5* 23.6* 25.6*  MCV 97.7 97.8 96.7  --   PLT 121* 117* 132*  --    . sodium chloride   Intravenous Once  . allopurinol  100 mg Oral BID  . antiseptic oral rinse  7 mL Mouth Rinse q12n4p  . aspirin EC  81 mg Oral QHS  . chlorhexidine  15 mL Mouth Rinse BID  . cinacalcet  30 mg  Oral Q breakfast  . darbepoetin (ARANESP) injection - DIALYSIS  200 mcg Intravenous Q Wed-HD  . feeding supplement (NEPRO CARB STEADY)  237 mL Oral BID BM  . heparin  5,000 Units Subcutaneous 3 times per day  . ipratropium-albuterol  3 mL Nebulization Q6H  . metoprolol  2.5 mg Intravenous Once  . metoprolol tartrate  12.5 mg Oral BID  . midodrine  10 mg Oral Once per day on Sun Tue Thu Sat  . midodrine  10 mg Oral Once per day on Mon Wed Fri  . midodrine  20 mg Oral Once per day on Mon Wed Fri  . midodrine  20 mg Oral Once per day on Sun Tue Thu Sat  . multivitamin  1 tablet Oral QHS  . piperacillin-tazobactam (ZOSYN)  IV  2.25 g Intravenous 3 times per day  . polyethylene glycol  17 g Oral QHS  . rosuvastatin  20 mg Oral q1800  . sevelamer carbonate  800 mg Oral TID WC  . [START ON 12/25/2014] vancomycin  750 mg Intravenous Q M,W,F-HD     acetaminophen, albuterol, calcium carbonate, colchicine, ondansetron (ZOFRAN) IV, oxyCODONE-acetaminophen, RESOURCE THICKENUP CLEAR, sevelamer carbonate

## 2014-12-25 ENCOUNTER — Inpatient Hospital Stay (HOSPITAL_COMMUNITY): Payer: Medicare Other

## 2014-12-25 DIAGNOSIS — J9801 Acute bronchospasm: Secondary | ICD-10-CM

## 2014-12-25 MED ORDER — SODIUM CHLORIDE 0.9 % IV BOLUS (SEPSIS)
250.0000 mL | Freq: Once | INTRAVENOUS | Status: AC
  Administered 2014-12-25: 250 mL via INTRAVENOUS

## 2014-12-25 NOTE — Progress Notes (Signed)
ANTIBIOTIC CONSULT NOTE - FOLLOW UP  Pharmacy Consult for vancomycin, zosyn Indication: PNA  No Known Allergies  Patient Measurements: Height: 5\' 8"  (172.7 cm) Weight: 165 lb 5.5 oz (75 kg) IBW/kg (Calculated) : 68.4  Vital Signs: Temp: 97.2 F (36.2 C) (01/29 0804) Temp Source: Oral (01/29 1212) BP: 119/37 mmHg (01/29 1212) Pulse Rate: 84 (01/29 1212) Intake/Output from previous day: 01/28 0701 - 01/29 0700 In: 760 [P.O.:460; IV Piggyback:300] Out: 1900  Intake/Output from this shift:    Labs:  Recent Labs  12/23/14 0732 12/24/14 0923  WBC 5.6  --   HGB 7.7* 8.2*  PLT 132*  --   CREATININE 7.78*  --    Estimated Creatinine Clearance: 6.6 mL/min (by C-G formula based on Cr of 7.78). No results for input(s): VANCOTROUGH, VANCOPEAK, VANCORANDOM, GENTTROUGH, GENTPEAK, GENTRANDOM, TOBRATROUGH, TOBRAPEAK, TOBRARND, AMIKACINPEAK, AMIKACINTROU, AMIKACIN in the last 72 hours.   Microbiology: Recent Results (from the past 720 hour(s))  MRSA PCR Screening     Status: None   Collection Time: 11/27/14 11:12 AM  Result Value Ref Range Status   MRSA by PCR NEGATIVE NEGATIVE Final    Comment:        The GeneXpert MRSA Assay (FDA approved for NASAL specimens only), is one component of a comprehensive MRSA colonization surveillance program. It is not intended to diagnose MRSA infection nor to guide or monitor treatment for MRSA infections.   Wound culture     Status: None   Collection Time: 12/20/14  6:20 PM  Result Value Ref Range Status   Specimen Description WOUND LEFT THIGH GRAFT  Final   Special Requests NONE  Final   Gram Stain   Final    NO WBC SEEN FEW SQUAMOUS EPITHELIAL CELLS PRESENT ABUNDANT GRAM POSITIVE COCCI IN PAIRS Performed at [facility]    Culture   Final    ABUNDANT METHICILLIN RESISTANT STAPHYLOCOCCUS AUREUS Note: RIFAMPIN AND GENTAMICIN SHOULD NOT BE USED AS SINGLE DRUGS FOR TREATMENT OF STAPH INFECTIONS. This organism DOES NOT  demonstrate inducible Clindamycin resistance in vitro. CRITICAL RESULT CALLED TO, READ BACK BY AND VERIFIED WITH: J CLEAVER RN  12/23/14 AT 16 AM BY [place] Performed at [facility]    Report Status 12/23/2014 FINAL  Final   Organism ID, Bacteria METHICILLIN RESISTANT STAPHYLOCOCCUS AUREUS  Final      Susceptibility   Methicillin resistant staphylococcus aureus - MIC*    CLINDAMYCIN <=0.25 SENSITIVE Sensitive     ERYTHROMYCIN >=8 RESISTANT Resistant     GENTAMICIN <=0.5 SENSITIVE Sensitive     LEVOFLOXACIN >=8 RESISTANT Resistant     OXACILLIN >=4 RESISTANT Resistant     PENICILLIN >=0.5 RESISTANT Resistant     RIFAMPIN <=0.5 SENSITIVE Sensitive     TRIMETH/SULFA <=10 SENSITIVE Sensitive     VANCOMYCIN 1 SENSITIVE Sensitive     TETRACYCLINE <=1 SENSITIVE Sensitive     * ABUNDANT METHICILLIN RESISTANT STAPHYLOCOCCUS AUREUS  Culture, blood (routine x 2)     Status: None (Preliminary result)   Collection Time: 12/20/14  7:20 PM  Result Value Ref Range Status   Specimen Description BLOOD LEFT HAND  Final   Special Requests BOTTLES DRAWN AEROBIC ONLY [place]  Final   Culture   Final           BLOOD CULTURE RECEIVED NO GROWTH TO DATE CULTURE WILL BE HELD FOR 5 DAYS BEFORE ISSUING A FINAL NEGATIVE REPORT Performed at [facility]    Report Status PENDING  Incomplete  Culture, blood (routine x 2)     Status: None (Preliminary result)   Collection Time: 12/20/14  7:55 PM  Result Value Ref Range Status   Specimen Description BLOOD LEFT THIGH  Final   Special Requests BOTTLES DRAWN AEROBIC AND ANAEROBIC 10 CC EA  Final   Culture   Final           BLOOD CULTURE RECEIVED NO GROWTH TO DATE CULTURE WILL BE HELD FOR 5 DAYS BEFORE ISSUING A FINAL NEGATIVE REPORT Performed at [facility]    Report Status PENDING  Incomplete    Anti-infectives    Start     Dose/Rate Route Frequency Ordered Stop   12/25/14 1200  vancomycin (VANCOCIN) IVPB 750 mg/150 ml premix     750  mg150 mL/hr over 60 Minutes Intravenous Every M-W-F (Hemodialysis) 12/23/14 1028     12/24/14 1600  vancomycin (VANCOCIN) IVPB 750 mg/150 ml premix     750 mg150 mL/hr over 60 Minutes Intravenous To Hemodialysis 12/24/14 1540 12/24/14 2037   12/23/14 1200  vancomycin (VANCOCIN) 500 mg in sodium chloride 0.9 % 100 mL IVPB     500 mg100 mL/hr over 60 Minutes Intravenous  Once 12/23/14 1028 12/23/14 1427   12/21/14 1200  vancomycin (VANCOCIN) IVPB 750 mg/150 ml premix  Status:  Discontinued     750 mg150 mL/hr over 60 Minutes Intravenous Every M-W-F (Hemodialysis) 12/20/14 1816 12/23/14 1028   12/20/14 2200  piperacillin-tazobactam (ZOSYN) IVPB 2.25 g     2.25 g100 mL/hr over 30 Minutes Intravenous 3 times per day 12/20/14 1816     12/20/14 2000  vancomycin (VANCOCIN) 1,500 mg in sodium chloride 0.9 % 500 mL IVPB     1,500 mg250 mL/hr over 120 Minutes Intravenous  Once 12/20/14 1816 12/20/14 2300   12/20/14 0000  azithromycin (ZITHROMAX Z-PAK) 250 MG tablet        12/20/14 0254        Assessment: 81 yo male with possible aspiration PNA on vancomycin and zosyn (noted with hx MRSA AVG infection.  He is noted to be ESRD and last HD 1/28.    Patient was scheduled to go to HD today but refused and is stating that he wants to stop dialysis. Palliative care consulted to discuss options.   Unfortunately vancomycin was hung after patient returned. Will check random vancomycin with am labs and adjust dosing accordingly.  Will need to develop new plan if lab draws and/or dialysis is discontinued.  1/24 vanc>> 1/24 zosyn>>  1/24 left thigh graft>> MRSA 1/24 BC X 2>>NGTD  Goal of Therapy:  Pre-HD vancomycin trough 15-25  Plan:  -vancomycin level with am labs -No zosyn changes needed -Will follow palliative care discussion and plan  Erin Hearing PharmD., BCPS Clinical Pharmacist Pager [phone] 12/25/2014 1:30 PM

## 2014-12-25 NOTE — Progress Notes (Addendum)
Physical Therapy Treatment Patient Details Name: Andrew [name] MRN: [id number] DOB: Dec 11, 1925 Today's Date: 12/25/2014    History of Present Illness 81 y.o. male admitted to [facility] on 12/17/14 with elevated triponin and low Hgb.  He is believed to have demand ischemia.  PMH - ESRD on HD, CABG, HTN, myelodysplastic syndrome. Also dx with aspiration PNA on dysphasia 2 diet, anemia (recieved 4 units PRBCs this admission), ESRD- on HD M, W, Fri, and new onset a-fib with RVR.      PT Comments    Pt appears somewhat stronger today but fatigues very quickly and was going to HD.  Pt stated "I am ready for all of this to be over. I am tired." Daughter came to room after pt taken to HD and I relayed this to her.  Follow Up Recommendations  SNF     Equipment Recommendations  None recommended by PT    Recommendations for Other Services       Precautions / Restrictions Precautions Precautions: Fall    Mobility  Bed Mobility Overal bed mobility: Needs Assistance Bed Mobility: Supine to Sit;Sit to Supine     Supine to sit: Min assist Sit to supine: +2 for physical assistance;Min assist   General bed mobility comments: Assist to bring trunk up. Assist to guide trunk and bring legs up into bed.  Transfers Overall transfer level: Needs assistance Equipment used: 4-wheeled walker Transfers: Sit to/from Stand Sit to Stand: +2 physical assistance;Min assist         General transfer comment: Assist to bring hips up.  Ambulation/Gait Ambulation/Gait assistance: +2 physical assistance;Min assist Ambulation Distance (Feet): 3 Feet Assistive device: 4-wheeled walker       General Gait Details: side-stepped up the side of the bed with short steps   Stairs            Wheelchair Mobility    Modified Rankin (Stroke Patients Only)       Balance Overall balance assessment: Needs assistance Sitting-balance support: No upper extremity supported;Feet supported Sitting  balance-Leahy Scale: Fair Sitting balance - Comments: Sat EOB x 6 minutes with supervision   Standing balance support: Bilateral upper extremity supported Standing balance-Leahy Scale: Poor Standing balance comment: rollator and min A                     Cognition Arousal/Alertness: Awake/alert Behavior During Therapy: WFL for tasks assessed/performed Overall Cognitive Status: Impaired/Different from baseline Area of Impairment: Memory     Memory: Decreased short-term memory              Exercises      General Comments        Pertinent Vitals/Pain Pain Assessment: No/denies pain    Home Living                      Prior Function            PT Goals (current goals can now be found in the care plan section) Progress towards PT goals: Progressing toward goals    Frequency  Min 3X/week    PT Plan Frequency needs to be updated;Current plan remains appropriate    Co-evaluation             End of Session Equipment Utilized During Treatment: Oxygen Activity Tolerance: Patient limited by fatigue;Other (comment) (pt being take to HD) Patient left: in bed;with call bell/phone within reach (going to HD)     Time: 3785-8850 PT Time  Calculation (min) (ACUTE ONLY): 9 min  Charges:  $Gait Training: 8-22 mins                    G Codes:      Leory Allinson 01/02/15, 11:07 AM  Suanne Marker PT [phone]

## 2014-12-25 NOTE — Progress Notes (Signed)
Brought pt to HD. He repeatedly tells me he "does not want to do this anymore." Is asking me how he stops. PA and MD notified.

## 2014-12-25 NOTE — Progress Notes (Signed)
Pt's BP started to decline throughout shift. Last BP was 97/27 (42). MAP has been around 40's-50's. Pt did have HD today but is asymptomatic. On-call NP for [facility] called and updated. 293ml bolus ordered and running. Will continue to monitor at this time.

## 2014-12-25 NOTE — Progress Notes (Signed)
Patient says he is tired and wants to stop dialysis. He is talking to his family now.  Will consult palliative care, have briefly discussed hospice options with them and what to expect in terms of time and symptoms.    Kelly Splinter MD (pgr) [phone]    [phone] 12/25/2014, 11:33 AM

## 2014-12-25 NOTE — Progress Notes (Signed)
[facility] Progress Note   Subjective: did extra HD last night for SOB, pulled arterial needle in HD, was confused, doesn't remember what happened; 1.9L removed, BP's in low 100's, still wheezing and coughing today  Filed Vitals:   12/25/14 0400 12/25/14 0405 12/25/14 0600 12/25/14 0804  BP: 99/31  112/36 98/63  Pulse: 86  81 86  Temp:  97.6 F (36.4 C)  97.2 F (36.2 C)  TempSrc:  Oral  Oral  Resp: 22  22 26   Height:      Weight:      SpO2: 100%  100% 100%   Exam: Up in chair No jvd Chest bibasilar crackles and soft exp wheezes RRR 2/6 M at LUSB Abd soft, NTND No LE or UE edema Left thigh AVG +bruit, healing wound lateral aspect, no drainage at this time Neuro awake, responsive  HD: MWF [place] 4h   74.5kg   2K/3.5 Ca [place]    Heparin 1500   L fem AVG Hect 3 ug, Aranesp 200 / wk, Venofer none Lab: Hb 7.7, tfs 37%, phos 3.4, Ca 8.2, alb 3.3        Assessment: 1. Asp PNA - on vanc/ zosyn 2. SOB/ wheezing - CXR still looks wet yest, having a difficult time getting fluid off d/t hypotension / afib RVR w chest pain and EKG changes during Wed's HD 3. Afib with RVR - during HD, back in NSR now, on low dose MTP  4. ESRD HD MWF 5. Anemia d/t CKD/ MDS - requires tranfusion frequently, max esa, transfused one unit yest, Hb 8.2, keep above 8 per cardiology 6. MBD - cont meds 7. Chest pain on admission / hx CABG - demand ischemia per cards, last cath 11/15 w patent grafts 8. 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 Wound drainage L thigh - small amount, cx + MRSA, is on Vanc for #1, drainage resolved >  have d/w VVS, ok to use the new lateral limb  Plan- HD again today. Andrew [name] is not doing well from my perspective, difficult to get fluid off, complications with dialysis. I'm not optimistic about his outcome and have spoken to him and his daughter about his frailty at this point in time.  Will try again today to get more volume off and see  if this improves his situation.     Kelly Splinter MD  pager [phone]    cell [phone]  12/25/2014, 9:44 AM     Recent Labs Lab 12/21/14 0241 12/22/14 0334 12/23/14 0732  NA 138 138 136  K 4.7 4.5 4.7  CL 97 99 98  CO2 28 29 27   GLUCOSE 89 115* 106*  BUN 44* 37* 56*  CREATININE 6.93* 5.71* 7.78*  CALCIUM 8.5 8.1* 7.8*  PHOS  --   --  4.3    Recent Labs Lab 12/18/14 1124 12/23/14 0732  AST 41*  --   ALT 25  --   ALKPHOS 141*  --   BILITOT 0.8  --   PROT 6.4  --   ALBUMIN 3.2* 2.6*    Recent Labs Lab 12/21/14 0241 12/22/14 0334 12/23/14 0732 12/24/14 0923  WBC 3.5* 3.4* 5.6  --   HGB 8.0* 7.3* 7.7* 8.2*  HCT 25.2* 22.5* 23.6* 25.6*  MCV 97.7 97.8 96.7  --   PLT 121* 117* 132*  --    . sodium chloride   Intravenous Once  . allopurinol  100 mg Oral BID  .  antiseptic oral rinse  7 mL Mouth Rinse q12n4p  . aspirin EC  81 mg Oral QHS  . chlorhexidine  15 mL Mouth Rinse BID  . cinacalcet  30 mg Oral Q breakfast  . darbepoetin (ARANESP) injection - DIALYSIS  200 mcg Intravenous Q Wed-HD  . feeding supplement (NEPRO CARB STEADY)  237 mL Oral BID BM  . heparin  5,000 Units Subcutaneous 3 times per day  . ipratropium  0.5 mg Nebulization QID  . levalbuterol  1.25 mg Nebulization 4 times per day  . metoprolol  2.5 mg Intravenous Once  . metoprolol tartrate  12.5 mg Oral BID  . midodrine  10 mg Oral Once per day on Sun Tue Thu Sat  . midodrine  10 mg Oral Once per day on Mon Wed Fri  . midodrine  20 mg Oral Once per day on Mon Wed Fri  . midodrine  20 mg Oral Once per day on Sun Tue Thu Sat  . multivitamin  1 tablet Oral QHS  . piperacillin-tazobactam (ZOSYN)  IV  2.25 g Intravenous 3 times per day  . polyethylene glycol  17 g Oral QHS  . rosuvastatin  20 mg Oral q1800  . sevelamer carbonate  800 mg Oral TID WC  . vancomycin  750 mg Intravenous Q M,W,F-HD     acetaminophen, albuterol, calcium carbonate, colchicine, ondansetron (ZOFRAN) IV,  oxyCODONE-acetaminophen, RESOURCE THICKENUP CLEAR, sevelamer carbonate

## 2014-12-25 NOTE — Progress Notes (Signed)
Patient Demographics  Andrew [name], is a 81 y.o. male, DOB - 1925/12/31, YOV:[id number]  Admit date - 12/17/2014   Admitting Physician [name], MD  Outpatient Primary MD for the patient is [name]., MD  LOS - 8   Chief Complaint  Patient presents with  . Chest Pain        Subjective:   [name] today has, No headache, No chest pain, No abdominal pain - No Nausea, No new weakness tingling or numbness, No Cough - SOB.    Assessment & Plan   Elevated troponin due to demand ischemia from severe anemia. Patient presented with troponin of 0.5, went up to 2.42, cardiology consulted. Currently on aspirin, beta blocker and statin for secondary prevention. Transfusion to keep hemoglobin above 8. Cards following. No further workup. Chest pain-free    Aspiration pneumonia HCAP He is on dysphagia 2 diet, speech following. Empiric IV vancomycin and Zosyn. Monitor clinically. Follow cultures. I question ongoing microaspiration. Repeat chest x-ray. Long-term prognosis appears poor.   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 Got one more unit on 12/23/2014   Dysphagia Patient seen by SLP and recommended dysphagia 2 with neck are thick liquids.   ESRD Nephrology consulted, undergone dialysis already.Patient tells his days are Monday Wednesday and Friday. Discussed with nephrologist Dr. Jonnie Finner on 12/24/2014. Patient expressing wishes to Stop dialysis. Renal consulting palliative care.   Chronic combined diastolic and systolic CHF EF 74%. Compensated no acute issue from this standpoint..   New Onset Afib RVR   Mali VAsc 2 score 3 - Cards consulted. Poor candidate for anticoagulation  agree with cardiology. Low-dose aspirin and beta blocker as tolerated   L thigh wound  MRSA W culture, on Vanco, VVS called. Per vascular surgery no infection. Antibiotics for pneumonia being continued.   Dyslipidemia  On statin continue   Code Status: Full  Family Communication: DAUGHTER bedside  Disposition Plan: SNF   Procedures     Consults    Cardiology, renal, VVS, palliative care  Medications  Scheduled Meds: . allopurinol  100 mg Oral BID  . antiseptic oral rinse  7 mL Mouth Rinse q12n4p  . aspirin EC  81 mg Oral QHS  . chlorhexidine  15 mL Mouth Rinse BID  . cinacalcet  30 mg Oral Q breakfast  . darbepoetin (ARANESP) injection - DIALYSIS  200 mcg Intravenous Q Wed-HD  . feeding supplement (NEPRO CARB STEADY)  237 mL Oral BID BM  . heparin  5,000 Units Subcutaneous 3 times per day  . ipratropium  0.5 mg Nebulization QID  . levalbuterol  1.25 mg Nebulization 4 times per day  . metoprolol tartrate  12.5 mg Oral BID  . midodrine  10 mg Oral Once per day on Sun Tue Thu Sat  . midodrine  10 mg Oral Once per day on Mon Wed Fri  . midodrine  20 mg Oral Once per day on Mon Wed Fri  . midodrine  20 mg Oral Once per day on Sun Tue Thu Sat  . multivitamin  1 tablet Oral QHS  . piperacillin-tazobactam (ZOSYN)  IV  2.25 g Intravenous 3 times per day  . polyethylene  glycol  17 g Oral QHS  . rosuvastatin  20 mg Oral q1800  . sevelamer carbonate  800 mg Oral TID WC  . vancomycin  750 mg Intravenous Q M,W,F-HD   Continuous Infusions:  PRN Meds:.acetaminophen, albuterol, calcium carbonate, colchicine, ondansetron (ZOFRAN) IV, oxyCODONE-acetaminophen, RESOURCE THICKENUP CLEAR, sevelamer carbonate  DVT Prophylaxis    Heparin   Lab Results  Component Value Date   PLT 132* 12/23/2014    Antibiotics     Anti-infectives    Start     Dose/Rate Route Frequency Ordered Stop   12/25/14 1200  vancomycin (VANCOCIN) IVPB 750 mg/150 ml premix     750 mg150 mL/hr over 60  Minutes Intravenous Every M-W-F (Hemodialysis) 12/23/14 1028     12/24/14 1600  vancomycin (VANCOCIN) IVPB 750 mg/150 ml premix     750 mg150 mL/hr over 60 Minutes Intravenous To Hemodialysis 12/24/14 1540 12/24/14 2037   12/23/14 1200  vancomycin (VANCOCIN) 500 mg in sodium chloride 0.9 % 100 mL IVPB     500 mg100 mL/hr over 60 Minutes Intravenous  Once 12/23/14 1028 12/23/14 1427   12/21/14 1200  vancomycin (VANCOCIN) IVPB 750 mg/150 ml premix  Status:  Discontinued     750 mg150 mL/hr over 60 Minutes Intravenous Every M-W-F (Hemodialysis) 12/20/14 1816 12/23/14 1028   12/20/14 2200  piperacillin-tazobactam (ZOSYN) IVPB 2.25 g     2.25 g100 mL/hr over 30 Minutes Intravenous 3 times per day 12/20/14 1816     12/20/14 2000  vancomycin (VANCOCIN) 1,500 mg in sodium chloride 0.9 % 500 mL IVPB     1,500 mg250 mL/hr over 120 Minutes Intravenous  Once 12/20/14 1816 12/20/14 2300   12/20/14 0000  azithromycin (ZITHROMAX Z-PAK) 250 MG tablet        12/20/14 0916            Objective:   Filed Vitals:   12/25/14 0600 12/25/14 0804 12/25/14 1004 12/25/14 1033  BP: 112/36 98/63  138/43  Pulse: 81 86  94  Temp:  97.2 F (36.2 C)    TempSrc:  Oral    Resp: 22 26    Height:      Weight:      SpO2: 100% 100% 100%     Wt Readings from Last 3 Encounters:  12/24/14 75 kg (165 lb 5.5 oz)  11/30/14 77.6 kg (171 lb 1.2 oz)  11/24/14 77.565 kg (171 lb)     Intake/Output Summary (Last 24 hours) at 12/25/14 1201 Last data filed at 12/25/14 0429  Gross per 24 hour  Intake    390 ml  Output   1900 ml  Net  -1510 ml     Physical Exam  Awake Alert, Oriented X 3, No new F.N deficits, Normal affect [place].AT,PERRAL Supple Neck,No JVD, No cervical lymphadenopathy appriciated.  Symmetrical Chest wall movement, Good air movement bilaterally, CTAB RRR,No Gallops,Rubs or new Murmurs, No Parasternal Heave +ve B.Sounds, Abd Soft, No tenderness, No organomegaly appriciated, No rebound - guarding or  rigidity. No Cyanosis, Clubbing or edema, No new Rash or bruise, L thigh graft site appears stable   Data Review   Micro Results Recent Results (from the past 240 hour(s))  Wound culture     Status: None   Collection Time: 12/20/14  6:20 PM  Result Value Ref Range Status   Specimen Description WOUND LEFT THIGH GRAFT  Final   Special Requests NONE  Final   Gram Stain   Final    NO WBC SEEN FEW  SQUAMOUS EPITHELIAL CELLS PRESENT ABUNDANT GRAM POSITIVE COCCI IN PAIRS Performed at [facility]    Culture   Final    ABUNDANT METHICILLIN RESISTANT STAPHYLOCOCCUS AUREUS Note: RIFAMPIN AND GENTAMICIN SHOULD NOT BE USED AS SINGLE DRUGS FOR TREATMENT OF STAPH INFECTIONS. This organism DOES NOT demonstrate inducible Clindamycin resistance in vitro. CRITICAL RESULT CALLED TO, READ BACK BY AND VERIFIED WITH: J CLEAVER RN  12/23/14 AT 74 AM BY [facility] Performed at [facility]    Report Status 12/23/2014 FINAL  Final   Organism ID, Bacteria METHICILLIN RESISTANT STAPHYLOCOCCUS AUREUS  Final      Susceptibility   Methicillin resistant staphylococcus aureus - MIC*    CLINDAMYCIN <=0.25 SENSITIVE Sensitive     ERYTHROMYCIN >=8 RESISTANT Resistant     GENTAMICIN <=0.5 SENSITIVE Sensitive     LEVOFLOXACIN >=8 RESISTANT Resistant     OXACILLIN >=4 RESISTANT Resistant     PENICILLIN >=0.5 RESISTANT Resistant     RIFAMPIN <=0.5 SENSITIVE Sensitive     TRIMETH/SULFA <=10 SENSITIVE Sensitive     VANCOMYCIN 1 SENSITIVE Sensitive     TETRACYCLINE <=1 SENSITIVE Sensitive     * ABUNDANT METHICILLIN RESISTANT STAPHYLOCOCCUS AUREUS  Culture, blood (routine x 2)     Status: None (Preliminary result)   Collection Time: 12/20/14  7:20 PM  Result Value Ref Range Status   Specimen Description BLOOD LEFT HAND  Final   Special Requests BOTTLES DRAWN AEROBIC ONLY Outlook  Final   Culture   Final           BLOOD CULTURE RECEIVED NO GROWTH TO DATE CULTURE WILL BE HELD FOR 5 DAYS BEFORE ISSUING A  FINAL NEGATIVE REPORT Performed at [facility]    Report Status PENDING  Incomplete  Culture, blood (routine x 2)     Status: None (Preliminary result)   Collection Time: 12/20/14  7:55 PM  Result Value Ref Range Status   Specimen Description BLOOD LEFT THIGH  Final   Special Requests BOTTLES DRAWN AEROBIC AND ANAEROBIC 10 CC EA  Final   Culture   Final           BLOOD CULTURE RECEIVED NO GROWTH TO DATE CULTURE WILL BE HELD FOR 5 DAYS BEFORE ISSUING A FINAL NEGATIVE REPORT Performed at [facility]    Report Status PENDING  Incomplete    Radiology Reports Dg Chest 2 View  12/17/2014   CLINICAL DATA:  Chest pain last night. The pain was relieved with sublingual nitroglycerin.  EXAM: CHEST  2 VIEW  COMPARISON:  11/27/2014, 11/18/2014, 11/07/2014  FINDINGS: There is prior sternotomy and CABG. There is a right axillary-subclavian vascular stent. There are surgical clips in the left axilla.  There is no airspace consolidation or alveolar edema. There is mild vascular and interstitial thickening consistent with a minor degree of congestive failure. This is not as severe as on 11/27/2014. There is a tiny left pleural effusion. This is smaller than on 12/15/2014. Hilar, mediastinal and cardiac contours appear unremarkable. There is atherosclerotic calcification in the aorta.  IMPRESSION: Findings consistent with a mild degree of congestive failure with a very small left pleural effusion.   Electronically Signed   By: Andreas Newport M.D.   On: 12/17/2014 12:35   Dg Chest 2 View  11/27/2014   CLINICAL DATA:  Shortness of breath and chest pain  EXAM: CHEST  2 VIEW  COMPARISON:  11/18/2014  FINDINGS: Small bilateral pleural effusions. No overt pulmonary edema. Mild cardiomegaly that is  stable from prior. The aorta and hila are negative. The patient is status post CABG. Surgical changes to the left axilla. Prior right subclavian stenting.  IMPRESSION: Pulmonary venous congestion with small  pleural effusions.   Electronically Signed   By: Jorje Guild M.D.   On: 11/27/2014 06:16   Dg Chest Port 1 View  12/24/2014   CLINICAL DATA:  Difficulty breathing and wheezing  EXAM: PORTABLE CHEST - 1 VIEW  COMPARISON:  December 23, 2014  FINDINGS: There is underlying emphysema. There is mild generalized interstitial prominence, felt to represent edema. No airspace consolidation. Heart is enlarged with pulmonary vascularity within normal limits. There are surgical clips in left axilla. There is a subclavian vein stent on the right. Patient is status post coronary artery bypass grafting. No apparent adenopathy.  IMPRESSION: Evidence of a degree of congestive heart failure superimposed on emphysematous change. No airspace consolidation.   Electronically Signed   By: Lowella Grip M.D.   On: 12/24/2014 12:00   Dg Chest Port 1 View  12/23/2014   CLINICAL DATA:  Shortness of breath.  EXAM: PORTABLE CHEST - 1 VIEW  COMPARISON:  December 21, 2014.  FINDINGS: Stable cardiomediastinal silhouette. Status post coronary artery bypass graft. No pneumothorax or pleural effusion is noted. No acute pulmonary disease is noted. Bony thorax appears intact.  IMPRESSION: No acute cardiopulmonary abnormality seen.   Electronically Signed   By: Sabino Dick M.D.   On: 12/23/2014 12:11   Dg Chest Port 1 View  12/21/2014   CLINICAL DATA:  Aspiration.  EXAM: PORTABLE CHEST - 1 VIEW  COMPARISON:  12/20/2014.  FINDINGS: Mediastinum and hilar structures normal. Prior CABG. Stable cardiomegaly and pulmonary vascular congestion with stable mild interstitial prominence. These findings suggest congestive heart failure. Pneumonitis project in the right lung base cannot be excluded. No pleural effusion or pneumothorax. Right subclavian vascular stents are noted. The stents are in and angled position.  IMPRESSION: 1. Prior CABG. Persistent changes of mild congestive heart failure. Pneumonitis cannot be excluded, particularly in the  right lung base. No interim change from prior exam . 2. Right subclavian vascular stents are noted. They are in an angled position.   Electronically Signed   By: Marcello Moores  Register   On: 12/21/2014 07:32   Dg Chest Port 1 View  12/20/2014   CLINICAL DATA:  Chest pain and shortness of Breath  EXAM: PORTABLE CHEST - 1 VIEW  COMPARISON:  12/17/2014  FINDINGS: The heart size is mildly enlarged. Atherosclerotic calcification noted within the aortic arch. Previous median sternotomy and CABG procedure. Moderate pulmonary vascular congestion is noted. No airspace consolidation.  IMPRESSION: Mild cardiac enlargement and pulmonary vascular congestion   Electronically Signed   By: Kerby Moors M.D.   On: 12/20/2014 11:29   Dg Chest Port 1v Same Day  12/25/2014   CLINICAL DATA:  81 year old with pulmonary edema.  EXAM: PORTABLE CHEST - 1 VIEW SAME DAY  COMPARISON:  12/24/2014  FINDINGS: Again noted is a vascular stent in the right axillary vein region. Patchy densities in the lungs may represent mild edema and minimally changed. Heart size is upper limits of normal and stable. Evidence of prior CABG procedure. Atherosclerotic calcifications at the aortic arch.  IMPRESSION: Stable chest radiograph findings with evidence of mild edema. Minimal change from the previous examination.   Electronically Signed   By: Markus Daft M.D.   On: 12/25/2014 09:29   Dg Swallowing Func-speech Pathology  12/21/2014   Lamar Sprinkles, CCC-SLP  12/21/2014  2:51 PM  Objective Swallowing Evaluation: Modified Barium Swallowing Study   Patient Details  Name: Andrew [name] MRN: [id number] Date of Birth: 09/02/1926  Today's Date: 12/21/2014 Time: SLP Start Time (ACUTE ONLY): 0135-SLP Stop Time (ACUTE  ONLY): 0200 SLP Time Calculation (min) (ACUTE ONLY): 25 min  Past Medical History:  Past Medical History  Diagnosis Date  . ESRD on hemodialysis     Hemodialysis since 2003; Occluded access in both upper  extremities and the right lower extremity,  current access as of  Aug 2014 is L thigh AVG. Gets HD MWF at [facility].     . Duodenal ulcer     remote  . Chronic combined systolic and diastolic CHF (congestive heart  failure)     a. 02/2012 Echo: EF 65%, basal infolat AK, mild conc LVH;  b.  06/2013 Echo: EF 40-45%, mild LVH, Gr2 DD, basal inf HK->AK, Mod  AS, Mild MR, PASP 73;  c. 09/2014 Echo: EF 45% basal-mid inflat  and inf AK, Gr 2DD, mod AS.  Marland Kitchen Arteriosclerotic cardiovascular disease (ASCVD) 1998    a. [id number]; b. 06/2003 Cath: 4/4 patent grafts, EF 55%;  c.  09/2014 Cath: LM 99d, LADnl, LCX nl, RCA 100p, LIMA->LAD nl,  VG->OM2->OM3 nl, VG->RPL nl, EF 40-45%.  . Cerebrovascular disease   . Neuropathy of foot   . Degenerative joint disease   . Gastroesophageal reflux disease   . Impaired hearing     Bilateral; hearing aids relatively ineffective  . Hypertension   . Cholelithiasis 03/2011    Diagnosed incidentally on abdominal ultrasound in 03/2011  . Hepatic steatosis   . Macrocytosis 10/20/2012  . Thrombocytopenia 10/20/2012  . Hyperlipidemia   . Secondary hyperparathyroidism   . Peripheral vascular disease   . Gout   . Moderate aortic stenosis     a. 06/2013 Echo: Mod AS, mean grad: 25, peak grad: 57;  b.  09/2014 Echo: EF 45%, Gr 2 DD, Mod AS, valve area 1.15cm^2 (VTI),  1.1cm^2 (Vmax).  . Pulmonary hypertension     a. 06/2013 Echo: PASP 50mmHg.  Marland Kitchen Anemia     Prior blood transfusion  . Complication of anesthesia     daughter reports long episodes confusion after amnesia meds  . Renal cell carcinoma 2001    s/p right nephrectomy-2001; subsequent ESRD  . 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 Hypotension   . Pancytopenia   . MRSA infection     a. 08/2014->10/2014 L thigh dialysis graft.  . Infection of AV graft for dialysis     a. Recurrent L thigh infxn s/p revision 10/1, I & D 11/28, and  removal 11/01/2014.  Marland Kitchen Acute decompensated heart failure    Past Surgical History:  Past Surgical History  Procedure Laterality Date  .  Right nephrectomy  2001    Renal cell carcinoma  . Colonoscopy  01/24/2011    prominent vascular pattern, suboptimal prep but doable  . Esophagogastroduodenoscopy  01/24/2011    mild erosive reflux esophagitis, bulbar/antral erosions, bx  from antrum benign  . Av fistula repair  2008    revision of anastomosis of Right AVF  . Arteriovenous graft placement  2006    Thrombectomy and interposition jump graft revision to higher  axillary vein of LUA AVG  . Coronary artery bypass graft  1998    X4 VESSELS  . Multiple cysto/ resection tumor's with bx's  LAST ONE  05-09-2011  . Multiple surg's / interventions for  avgg/ fistula right upper  arm  LAST REVISION 06-29-2011    (JUNE 2542 [place] VEIN/ [id number] DILATATION  ANGIOPLASTY)  . Left ptc left renal artery    . Cardiac catheterization  2004  . Cataract extraction w/ intraocular lens  implant, bilateral    . Cystoscopy w/ retrogrades  12/28/2011    Procedure: CYSTOSCOPY WITH RETROGRADE PYELOGRAM;  Surgeon: Malka So, MD;  Location[facility];  Service:  Urology;  Laterality: Left;  C-ARM   . Transurethral resection of bladder tumor  12/28/2011    Procedure: TRANSURETHRAL RESECTION OF BLADDER TUMOR (TURBT);   Surgeon: Malka So, MD;  Location: [facility];   Service: Urology;  Laterality: N/A;  . Av fistula placement  02/29/2012    Procedure: INSERTION OF ARTERIOVENOUS (AV) GORE-TEX GRAFT  THIGH;  Surgeon: Rosetta Posner, MD;  Location: [place];  Service:  Vascular;  Laterality: Right;  Insertion right femoral  arteriovenous gortex graft  . Ligation of right braciocephalic av fistula Right 04-04-2012  . Insertion of dialysis catheter  06/05/2012    Procedure: INSERTION OF DIALYSIS CATHETER;  Surgeon: Mal Misty, MD;  Location: [place];  Service: Vascular;  Laterality:  Left;  Insertion diatek catheter left IJ  . Insertion of dialysis catheter  08/22/2012    Procedure: INSERTION OF DIALYSIS CATHETER;  Surgeon: Serafina Mitchell, MD;   Location: [facility];  Service: Vascular;   Laterality: Left;  insertion of dialysis catheter left  internal  jugular 27cm  . Av fistula placement  10/08/2012    Procedure: INSERTION OF ARTERIOVENOUS (AV) GORE-TEX GRAFT  THIGH;  Surgeon: Angelia Mould, MD;  Location: [place];   Service: Vascular;  Laterality: Left;  . Cystoscopy with biopsy N/A 08/07/2013    Procedure: CYSTOSCOPY WITH BLADDER BIOPSY;  Surgeon: Malka So, MD;  Location: [facility];  Service: Urology;  Laterality:  N/A;  . Fulguration of bladder tumor N/A 08/07/2013    Procedure: FULGURATION OF BLADDER TUMOR;  Surgeon: Malka So, MD;  Location: [facility];  Service: Urology;  Laterality:  N/A;  . Revision of arteriovenous goretex graft Left 08/27/2014    Procedure: THROMBECTOMY & REVISION OF LEFT ARM  ARTERIOVENOUS  GORETEX GRAFT;  Surgeon: Angelia Mould, MD;  Location: [place];  Service: Vascular;  Laterality: Left;  . Revision of arteriovenous goretex graft Left 10/01/2014    Procedure: SEGMENT OF Left THIGH ARTERIOVENOUS GORETEX GRAFT  Removed;  Surgeon: Mal Misty, MD;  Location: [place];   Service: Vascular;  Laterality: Left;  . Removal of graft Left 11/01/2014    Procedure: REMOVAL OF INFECTED LEFT THIGH GRAFT;  Surgeon:  Serafina Mitchell, MD;  Location: [place] [facility];  Service: Vascular;   Laterality: Left;  removal of infected left dialysis graft.  . Left heart catheterization with coronary/graft angiogram N/A  10/27/2014    Procedure: LEFT HEART CATHETERIZATION WITH Beatrix Fetters;  Surgeon: Leonie Man, MD;  [facility];   Service: Cardiovascular;  Laterality: N/A;  . Tonsillectomy     HPI:  HPI: 81 y/o ? ESRD m/w/f,  H/o CVA, suspect COPD recent admission  11/27/14-11/30/14 Decompensated HF 2/2 to dietary indiscretion  No Data Recorded  Assessment / Plan / Recommendation CHL IP CLINICAL IMPRESSIONS 12/21/2014  Dysphagia Diagnosis (None)  Clinical impression Pt with oral/pharyngeal dysphagia  characterized by delayed  oral transit, uncoordinated mastication,  delay in  the swallow trigger, valleculae residue and pharyngeal  weakness.  Penetration occured during the swallow impacted by  decreased epiglottic inversion due to sensory and motor deficits.   Chin tuck was not effective to eliminate penetration with thin  liquid boluses from a cup.  Esophageal sweep revealed it mildly  slow to clear.  Rx begin dysphagia 2 diet with nectar thick  liquids.  Pt to be totally upright for all intake and should not  use straws.        CHL IP TREATMENT RECOMMENDATION 12/21/2014  Treatment Plan Recommendations Therapy as outlined in treatment  plan below     CHL IP DIET RECOMMENDATION 12/21/2014  Diet Recommendations Dysphagia 2 (Fine chop);Nectar-thick liquid  Liquid Administration via Cup;No straw  Medication Administration Whole meds with puree  Compensations Slow rate;Small sips/bites;Check for anterior  loss;Follow solids with liquid  Postural Changes and/or Swallow Maneuvers Out of bed for  meals;Seated upright 90 degrees;Upright 30-60 min after meal     CHL IP OTHER RECOMMENDATIONS 12/21/2014  Recommended Consults (None)  Oral Care Recommendations Oral care Q4 per protocol  Other Recommendations Order thickener from pharmacy;Prohibited  food (jello, ice cream, thin soups);Remove water pitcher     CHL IP FOLLOW UP RECOMMENDATIONS 12/21/2014  Follow up Recommendations Home health SLP     CHL IP FREQUENCY AND DURATION 12/21/2014  Speech Therapy Frequency (ACUTE ONLY) min 2x/week  Treatment Duration 2 weeks     Pertinent Vitals/Pain None verbalized    SLP Swallow Goals No flowsheet data found.  No flowsheet data found.    CHL IP REASON FOR REFERRAL 12/21/2014  Reason for Referral Objectively evaluate swallowing function     CHL IP ORAL PHASE 12/21/2014  Lips (None)  Tongue (None)  Mucous membranes (None)  Nutritional status (None)  Other (None)  Oxygen therapy (None)  Oral Phase Impaired  Oral - Pudding Teaspoon WFL;Delayed oral transit  Oral -  Pudding Cup (None)  Oral - Honey Teaspoon (None)  Oral - Honey Cup (None)  Oral - Honey Syringe (None)  Oral - Nectar Teaspoon (None)  Oral - Nectar Cup Right anterior bolus loss  Oral - Nectar Straw (None)  Oral - Nectar Syringe (None)  Oral - Ice Chips (None)  Oral - Thin Teaspoon Right anterior bolus loss  Oral - Thin Cup Right anterior bolus loss  Oral - Thin Straw (None)  Oral - Thin Syringe (None)  Oral - Puree (None)  Oral - Mechanical Soft (None)  Oral - Regular Weak lingual manipulation;Impaired  mastication;Delayed oral transit  Oral - Multi-consistency (None)  Oral - Pill (None)  Oral Phase - Comment Pt with some tongue base residue for purees  that would spill into the vallecula.        CHL IP PHARYNGEAL PHASE 12/21/2014  Pharyngeal Phase Impaired Pharyngeal - Pudding Teaspoon Delayed  swallow initiation;Reduced epiglottic inversion;Premature  spillage to valleculae;Pharyngeal residue - valleculae  Penetration/Aspiration details (pudding teaspoon) (None)  Pharyngeal - Pudding Cup (None)  Penetration/Aspiration details (pudding cup) (None)  Pharyngeal - Honey Teaspoon (None)  Penetration/Aspiration details (honey teaspoon) (None)  Pharyngeal - Honey Cup (None)  Penetration/Aspiration details (honey cup) (None)  Pharyngeal - Honey Syringe (None)  Penetration/Aspiration details (honey syringe) (None)  Pharyngeal - Nectar Teaspoon (None)  Penetration/Aspiration details (nectar teaspoon) (None)  Pharyngeal - Nectar Cup Delayed swallow initiation;Reduced  epiglottic inversion;Pharyngeal residue - valleculae;Premature  spillage to valleculae  Penetration/Aspiration details (nectar cup) (None)  Pharyngeal - Nectar Straw (None)  Penetration/Aspiration details (nectar straw) (  None)  Pharyngeal - Nectar Syringe (None)  Penetration/Aspiration details (nectar syringe) (None)  Pharyngeal - Ice Chips (None)  Penetration/Aspiration details (ice chips) (None)  Pharyngeal - Thin Teaspoon Delayed swallow initiation;Reduced   epiglottic inversion;Pharyngeal residue - valleculae;Premature  spillage to valleculae;Premature spillage to pyriform sinuses  Penetration/Aspiration details (thin teaspoon) (None)  Pharyngeal - Thin Cup Delayed swallow initiation;Premature  spillage to valleculae;Premature spillage to pyriform  sinuses;Reduced epiglottic inversion;Penetration/Aspiration  during swallow;Trace aspiration;Pharyngeal residue - valleculae  Penetration/Aspiration details (thin cup) Material enters airway,  remains ABOVE vocal cords and not ejected out  Pharyngeal - Thin Straw (None)  Penetration/Aspiration details (thin straw) (None)  Pharyngeal - Thin Syringe (None)  Penetration/Aspiration details (thin syringe') (None)  Pharyngeal - Puree Delayed swallow initiation;Premature spillage  to valleculae;Reduced epiglottic inversion;Pharyngeal residue -  valleculae  Penetration/Aspiration details (puree) (None)  Pharyngeal - Mechanical Soft (None)  Penetration/Aspiration details (mechanical soft) (None)  Pharyngeal - Regular Delayed swallow initiation;Premature  spillage to valleculae;Reduced epiglottic inversion;Pharyngeal  residue - valleculae  Penetration/Aspiration details (regular) (None)  Pharyngeal - Multi-consistency (None)  Penetration/Aspiration details (multi-consistency) (None)  Pharyngeal - Pill (None)  Penetration/Aspiration details (pill) (None)  Pharyngeal Comment (None)     CHL IP CERVICAL ESOPHAGEAL PHASE 12/21/2014  Cervical Esophageal Phase WFL  Pudding Teaspoon (None)  Pudding Cup (None)  Honey Teaspoon (None)  Honey Cup (None)  Honey Syringe (None)  Nectar Teaspoon (None)  Nectar Cup (None)  Nectar Straw (None)  Nectar Syringe (None)  Thin Teaspoon (None)  Thin Cup (None)  Thin Straw (None)  Thin Syringe (None)  Cervical Esophageal Comment (None)    No flowsheet data found.         Lamar Sprinkles 12/21/2014, 2:49 PM  Shelly Flatten, MA, CCC-SLP Acute Rehab SLP [phone]       CBC  Recent Labs Lab 12/19/14 [phone]  12/20/14 0530 12/21/14 0241 12/22/14 0334 12/23/14 0732 12/24/14 0923  WBC 3.7* 3.9* 3.5* 3.4* 5.6  --   HGB 8.3* 9.2* 8.0* 7.3* 7.7* 8.2*  HCT 25.4* 28.4* 25.2* 22.5* 23.6* 25.6*  PLT 105* 140* 121* 117* 132*  --   MCV 97.7 97.6 97.7 97.8 96.7  --   MCH 31.9 31.6 31.0 31.7 31.6  --   MCHC 32.7 32.4 31.7 32.4 32.6  --   RDW 22.8* 22.1* 21.8* 21.5* 21.7*  --     Chemistries   Recent Labs Lab 12/19/14 0419 12/20/14 0530 12/21/14 0241 12/22/14 0334 12/23/14 0732  NA 136 134* 138 138 136  K 4.2 4.9 4.7 4.5 4.7  CL 97 94* 97 99 98  CO2 $Re'25 27 28 29 27  'fsw$ GLUCOSE 98 86 89 115* 106*  BUN 23 46* 44* 37* 56*  CREATININE 4.70* 7.21* 6.93* 5.71* 7.78*  CALCIUM 8.0* 7.8* 8.5 8.1* 7.8*   ------------------------------------------------------------------------------------------------------------------ estimated creatinine clearance is 6.6 mL/min (by C-G formula based on Cr of 7.78). ------------------------------------------------------------------------------------------------------------------ No results for input(s): HGBA1C in the last 72 hours. ------------------------------------------------------------------------------------------------------------------ No results for input(s): CHOL, HDL, LDLCALC, TRIG, CHOLHDL, LDLDIRECT in the last 72 hours. ------------------------------------------------------------------------------------------------------------------ No results for input(s): TSH, T4TOTAL, T3FREE, THYROIDAB in the last 72 hours.  Invalid input(s): FREET3 ------------------------------------------------------------------------------------------------------------------ No results for input(s): VITAMINB12, FOLATE, FERRITIN, TIBC, IRON, RETICCTPCT in the last 72 hours.  Coagulation profile No results for input(s): INR, PROTIME in the last 168 hours.  No results for input(s): DDIMER in the last 72 hours.  Cardiac Enzymes No results for input(s): CKMB, TROPONINI, MYOGLOBIN  in the last 168 hours.  Invalid input(s):  CK ------------------------------------------------------------------------------------------------------------------ Invalid input(s): POCBNP     Time Spent in minutes   35   Jameson Tormey K M.D on 12/25/2014 at 12:01 PM  Between 7am to 7pm - Pager - [phone]  After 7pm go to [email or web address] - [facility] Office  [phone]

## 2014-12-25 NOTE — Progress Notes (Signed)
PROGRESS NOTE  Subjective:    Patient is a 81 y.o. male with a PMHx of ESRD, on hemodialysis, CAD (s/p CABG in 1998. Last cath 09/2014: 99% distal LM; LAD normal LCx normal RCA 100%; LIMA to LAD patent; SVG to OM2/OM3 patent; SVG to PLSA normal LVEF 40 to 45%), anemia, HTN, ESRD, moderate aortic stenosis myelodysplasia, HTN, bladder cancer , COPD, chronic anemia , who was admitted to [facility] on 12/17/2014 for evaluation of dyspnea.  Troponins peaked at 2.42.     Objective:    Vital Signs:   Temp:  [97.2 F (36.2 C)-99.1 F (37.3 C)] 97.2 F (36.2 C) (01/29 0804) Pulse Rate:  [74-96] 86 (01/29 0804) Resp:  [16-31] 26 (01/29 0804) BP: (91-160)/(25-68) 98/63 mmHg (01/29 0804) SpO2:  [91 %-100 %] 100 % (01/29 0804) Weight:  [164 lb 14.5 oz (74.8 kg)-168 lb 14 oz (76.6 kg)] 165 lb 5.5 oz (75 kg) (01/28 1820)  Last BM Date: 12/23/14   24-hour weight change: Weight change: 3 lb 15.5 oz (1.8 kg)  Weight trends: Filed Weights   12/24/14 1100 12/24/14 1433 12/24/14 1820  Weight: 164 lb 14.5 oz (74.8 kg) 168 lb 14 oz (76.6 kg) 165 lb 5.5 oz (75 kg)    Intake/Output:  01/28 0701 - 01/29 0700 In: 760 [P.O.:460; IV Piggyback:300] Out: 1900      Physical Exam: BP 98/63 mmHg  Pulse 86  Temp(Src) 97.2 F (36.2 C) (Oral)  Resp 26  Ht 5\' 8"  (1.727 m)  Wt 165 lb 5.5 oz (75 kg)  BMI 25.15 kg/m2  SpO2 100%  Wt Readings from Last 3 Encounters:  12/24/14 165 lb 5.5 oz (75 kg)  11/30/14 171 lb 1.2 oz (77.6 kg)  11/24/14 171 lb (77.565 kg)    General: Vital signs reviewed and noted. Chronically ill appearing man   Head: Normocephalic, atraumatic.  Eyes: conjunctivae/corneas clear.  EOM's intact.   Throat: normal  Neck:  normal   Lungs:    bilateral wheezing continues.   Heart:  RR , 2/6 systolic murmur   Abdomen:  Soft, non-tender, non-distended    Extremities: No edema    Neurologic: A&O X3, CN II - XII are grossly intact.   Psych: Normal      Labs: BMET:  Recent Labs  12/23/14 0732  NA 136  K 4.7  CL 98  CO2 27  GLUCOSE 106*  BUN 56*  CREATININE 7.78*  CALCIUM 7.8*  PHOS 4.3    Liver function tests:  Recent Labs  12/23/14 0732  ALBUMIN 2.6*   No results for input(s): LIPASE, AMYLASE in the last 72 hours.  CBC:  Recent Labs  12/23/14 0732 12/24/14 0923  WBC 5.6  --   HGB 7.7* 8.2*  HCT 23.6* 25.6*  MCV 96.7  --   PLT 132*  --     Cardiac Enzymes: No results for input(s): CKTOTAL, CKMB, TROPONINI in the last 72 hours.  Coagulation Studies: No results for input(s): LABPROT, INR in the last 72 hours.  Other: Invalid input(s): POCBNP No results for input(s): DDIMER in the last 72 hours. No results for input(s): HGBA1C in the last 72 hours. No results for input(s): CHOL, HDL, LDLCALC, TRIG, CHOLHDL in the last 72 hours. No results for input(s): TSH, T4TOTAL, T3FREE, THYROIDAB in the last 72 hours.  Invalid input(s): FREET3 No results for input(s): VITAMINB12, FOLATE, FERRITIN, TIBC, IRON, RETICCTPCT in the last 72 hours.   Other results:  Tele  :  NSR   Medications:    Infusions:    Scheduled Medications: . sodium chloride   Intravenous Once  . allopurinol  100 mg Oral BID  . antiseptic oral rinse  7 mL Mouth Rinse q12n4p  . aspirin EC  81 mg Oral QHS  . chlorhexidine  15 mL Mouth Rinse BID  . cinacalcet  30 mg Oral Q breakfast  . darbepoetin (ARANESP) injection - DIALYSIS  200 mcg Intravenous Q Wed-HD  . feeding supplement (NEPRO CARB STEADY)  237 mL Oral BID BM  . heparin  5,000 Units Subcutaneous 3 times per day  . ipratropium  0.5 mg Nebulization QID  . levalbuterol  1.25 mg Nebulization 4 times per day  . metoprolol  2.5 mg Intravenous Once  . metoprolol tartrate  12.5 mg Oral BID  . midodrine  10 mg Oral Once per day on Sun Tue Thu Sat  . midodrine  10 mg Oral Once per day on Mon Wed Fri  . midodrine  20 mg Oral Once per day on Mon Wed Fri  . midodrine  20 mg Oral  Once per day on Sun Tue Thu Sat  . multivitamin  1 tablet Oral QHS  . piperacillin-tazobactam (ZOSYN)  IV  2.25 g Intravenous 3 times per day  . polyethylene glycol  17 g Oral QHS  . rosuvastatin  20 mg Oral q1800  . sevelamer carbonate  800 mg Oral TID WC  . vancomycin  750 mg Intravenous Q M,W,F-HD    Assessment/ Plan:    1. Atrial fib with RVR: he remains in NSR at this point. Continue current meds.  He is not a candidate for anticoaulation  2. CAD.  His cath in Dec. Showed that his grafts are patent. Troponin levels are minimally elevated.  It appears that he gets into trouble whenever his Hb drops too low .  It may be beneficial to transfuse him at a slightly higher Hb - before he develops these complications  3. ESRD:  On dialysis  4. Chronic anemia:  Further management per int. Med.   5. Aortic stenosis:  Mild .  6 chronic systolic CHF:  EF 27%.  Fairly stable . , he has diuresed well -1.1 liters overnight.   7. Bronchospasm:  He is still wheezing. Further plans per Int Med.   Disposition:  Length of Stay: 8  Thayer Headings, Brooke Bonito., MD, [facility] 12/25/2014, 9:28 AM Office [phone] Pager [phone]

## 2014-12-25 NOTE — Consult Note (Signed)
Chart review: 5/04 - CHF d/t vol excess and renal failure, high K, ESRD on HD, HTN 6/04 - pulm edema/ vol overload 7/04 - pulm edema/ vol overload, rx with HD  8/04 - CP/ SOB, underwent LHC which showed patent grafts from prior CABG 1998; hx prior nephx, TCCA of bladder, hx CVA 12/06 - pulm edema, vol ocerload 3/08 - urosepsis, poss cath sepsis, responded to IV abx. ESRD on HD  2/11 - COPD / tracheobronchitis, acute; acute gout, ESRD on HD 10/12 - SOB, orthopnea, resolved with HD 4/13 - insertion of R thigh AVGG 4/13 - Delirium, gait change, anemia, hx duodenal ulcer, HTN, ESRD 4/13 - chest pain due to pulm edema, rx with HD, also high K 5/13 - acute pulm edema, HTN, high K, ESRD; improved with HD 6/13 - dyspnea with IS edema on CXR, clotted thigh AVG > temp cath and HD resolved SOB, T-lysis of AVG by IR 9/13 - cath-related micrococcus bacteremia, ESRD on HD, pancytopenia, anemia required PRBC's x 1  11/13 - SOB d/t CHF./vol excess, rx with HD 11/13 - severe anemia, Hb 7.3 > rec'd 2u prbc's. ESRD on HD. Last colonoscopy was 2012, EGD 2012 showed eroisve esophagitis and antral erosions. Chronic low PLT's, hx CABG, bladder Ca 7/14 - CP/SOB dx was CAP rx with 6 days abx. SIRS on admission resolved with ABX. HCAP was final dx. Vanc/cefipime 9/14 - Anemia Hb 7, hx MDS, rec'd prbc's. Had some CP which resolved. Revlimid stopped (for MDS) d/t anemia.  08/27/14 - OP - had thrombectomy and addition of new venous limb because of eschar overlying the venous limb of the AVG 10/01/14 - OP - had removal of short segment of infected nonfunctional left thigh graft (venous limb) 11/27 - 11/03/14 > presented with CP w exertion, also purulent L thigh AVG infection > underwent [place] cath on 12/1 showed some disease in smaller vessels, no intervention required. AFter cardiac w/u fevers worsened and drainage from AVG prompted trip to OR with VVS for removal of the rest of the lateral limb nonfunctional AVG  component. AFter surgery fevers resolved, ABx to be cont'd for 7 days from date of surgery 12/23 - 11/19/14 > chest pain, ruled out for MI. Recent cath showed 4/4 grafts patent. Also anemia, hx MDS w 5Q deletion,. ESRD on HD, low plt's chronic  1/1 - 11/30/14 > SOB and CP, similar to last admission, had a little "extra holiday food" > CXR showed vasc congestion, small effusions, felt to be d/t vol excess , improved after HD x 2. Lungs improved, dc'd home.  1/21 - 12/20/14 > CP awoke from asleep, took NTG, went to ED , chronic dyspnea x 6 months > CXR showed pulm edema, had NSTEMI, recent heart cath showing patent bypass grafts, trop trended down. Cards rec'd keep Hb above 8. Hx MDS requires frequent transfusion., almost every 2 weeks now per family.  Had HD, dc'd home.   Kelly Splinter MD (pgr) [phone]    ([phone] 12/25/2014, 9:57 AM

## 2014-12-25 NOTE — Progress Notes (Signed)
Pt reporting 8/10 chest pain accompanied by nausea. States chest pain started earlier in the morning around 10am when he ambulated in the hallway with cardiac rehab. One nitro tab given and pain decreased to 7/10. Second nitro tab given and pain decreased again to 5/10. EKG taken and Dr. Acie Fredrickson notified. Orders for nitro drip given. Will titrate as ordered and continue to monitor. VSS. Pt AAOx4.

## 2014-12-25 NOTE — Progress Notes (Signed)
Speech Language Pathology Treatment: Dysphagia  Patient Details Name: Andrew [name] MRN: [id number] DOB: 1926-01-15 Today's Date: 12/25/2014 Time: 3299-2426 SLP Time Calculation (min) (ACUTE ONLY): 18 min  Assessment / Plan / Recommendation Clinical Impression  Pt seen with am meal, minimal intake. Pt observe dto have continued oral and oropharygneal delay in swallowing. SLP assisted pt in safe intake with minimal cues. Discussed precautions with RN. Pt not showing any improvement in function and appears to be quite short of breath and confused, further increasing risk of aspiration. Pt should continue modified with with precautions. SLP to f/u for tolerance.    HPI HPI: 81 y/o ? ESRD m/w/f,  H/o CVA, suspect COPD recent admission 11/27/14-11/30/14 Decompensated HF 2/2 to dietary indiscretion   Pertinent Vitals    SLP Plan  Continue with current plan of care    Recommendations Diet recommendations: Dysphagia 2 (fine chop);Nectar-thick liquid Liquids provided via: Cup Medication Administration: Whole meds with puree Supervision: Patient able to self feed;Staff to assist with self feeding;Intermittent supervision to cue for compensatory strategies Compensations: Slow rate;Small sips/bites;Check for anterior loss;Follow solids with liquid Postural Changes and/or Swallow Maneuvers: Out of bed for meals;Seated upright 90 degrees;Upright 30-60 min after meal              Plan: Continue with current plan of care    GO    [facility], MA CCC-SLP [phone]  Lynann Beaver 12/25/2014, 9:22 AM

## 2014-12-26 DIAGNOSIS — Z515 Encounter for palliative care: Secondary | ICD-10-CM

## 2014-12-26 DIAGNOSIS — Z66 Do not resuscitate: Secondary | ICD-10-CM | POA: Diagnosis present

## 2014-12-26 LAB — CBC
HCT: 24.8 % — ABNORMAL LOW (ref 39.0–52.0)
Hemoglobin: 8 g/dL — ABNORMAL LOW (ref 13.0–17.0)
MCH: 31.3 pg (ref 26.0–34.0)
MCHC: 32.3 g/dL (ref 30.0–36.0)
MCV: 96.9 fL (ref 78.0–100.0)
Platelets: 139 10*3/uL — ABNORMAL LOW (ref 150–400)
RBC: 2.56 MIL/uL — ABNORMAL LOW (ref 4.22–5.81)
RDW: 21.9 % — ABNORMAL HIGH (ref 11.5–15.5)
WBC: 2.8 10*3/uL — AB (ref 4.0–10.5)

## 2014-12-26 LAB — VANCOMYCIN, RANDOM: VANCOMYCIN RM: 34.6 ug/mL

## 2014-12-26 MED ORDER — IPRATROPIUM-ALBUTEROL 0.5-2.5 (3) MG/3ML IN SOLN
3.0000 mL | RESPIRATORY_TRACT | Status: DC
  Administered 2014-12-26 (×2): 3 mL via RESPIRATORY_TRACT
  Filled 2014-12-26: qty 3

## 2014-12-26 MED ORDER — DIAZEPAM 1 MG/ML PO SOLN: 5.0000 mg | ORAL | Status: DC | PRN

## 2014-12-26 MED ORDER — MORPHINE SULFATE (CONCENTRATE) 10 MG/0.5ML PO SOLN
10.0000 mg | ORAL | Status: DC | PRN
  Administered 2014-12-26: 10 mg via ORAL
  Filled 2014-12-26: qty 0.5

## 2014-12-26 MED ORDER — IPRATROPIUM-ALBUTEROL 0.5-2.5 (3) MG/3ML IN SOLN
RESPIRATORY_TRACT | Status: AC
  Filled 2014-12-26: qty 3

## 2014-12-26 MED ORDER — HALOPERIDOL LACTATE 2 MG/ML PO CONC
1.0000 mg | ORAL | Status: DC | PRN
  Filled 2014-12-26: qty 0.5

## 2014-12-26 MED ORDER — DIAZEPAM 5 MG/ML PO CONC: 5.0000 mg | ORAL | Status: DC | PRN

## 2014-12-26 MED ORDER — SCOPOLAMINE 1 MG/3DAYS TD PT72
1.0000 | MEDICATED_PATCH | TRANSDERMAL | Status: DC
  Administered 2014-12-26: 1.5 mg via TRANSDERMAL
  Filled 2014-12-26: qty 1

## 2014-12-26 MED ORDER — DIPHENHYDRAMINE-ZINC ACETATE 1-0.1 % EX CREA: TOPICAL_CREAM | Freq: Three times a day (TID) | CUTANEOUS | Status: AC | PRN

## 2014-12-26 MED ORDER — MORPHINE SULFATE (CONCENTRATE) 10 MG/0.5ML PO SOLN: 10.0000 mg | ORAL | Status: AC | PRN

## 2014-12-26 MED ORDER — RESOURCE THICKENUP CLEAR PO POWD: ORAL | Status: AC

## 2014-12-26 MED ORDER — DIAZEPAM 5 MG/ML PO CONC: 5.0000 mg | ORAL | Status: AC | PRN

## 2014-12-26 NOTE — Consult Note (Signed)
Met with patient and his family- we discussed his current condition and goals. Full comfort care. They want to take him home with hospice today -I will help facilitate that discharge plan.  Lane Hacker, DO Palliative Medicine

## 2014-12-26 NOTE — Discharge Instructions (Signed)
Follow with Primary MD [name]., MD as desired   Activity: As tolerated with Full fall precautions use walker/cane & assistance as needed   Disposition Home     Diet: Dysphagia to renal diet with nectar thick liquids  with feeding assistance and aspiration precautions as needed.  For Heart failure patients - Check your Weight same time everyday, if you gain over 2 pounds, or you develop in leg swelling, experience more shortness of breath or chest pain, call your Primary MD immediately. Follow Cardiac Low Salt Diet and 1.8 lit/day fluid restriction.   On your next visit with your primary care physician please Get Medicines reviewed and adjusted.   Please request your Prim.MD to go over all Hospital Tests and Procedure/Radiological results at the follow up, please get all Hospital records sent to your Prim MD by signing hospital release before you go home.   If you experience worsening of your admission symptoms, develop shortness of breath, life threatening emergency, suicidal or homicidal thoughts you must seek medical attention immediately by calling 911 or calling your MD immediately  if symptoms less severe.  You Must read complete instructions/literature along with all the possible adverse reactions/side effects for all the Medicines you take and that have been prescribed to you. Take any new Medicines after you have completely understood and accpet all the possible adverse reactions/side effects.   Do not drive, operating heavy machinery, perform activities at heights, swimming or participation in water activities or provide baby sitting services if your were admitted for syncope or siezures until you have seen by Primary MD or a Neurologist and advised to do so again.  Do not drive when taking Pain medications.    Do not take more than prescribed Pain, Sleep and Anxiety Medications  Special Instructions: If you have smoked or chewed Tobacco  in the last 2 yrs please  stop smoking, stop any regular Alcohol  and or any Recreational drug use.  Wear Seat belts while driving.   Please note  You were cared for by a hospitalist during your hospital stay. If you have any questions about your discharge medications or the care you received while you were in the hospital after you are discharged, you can call the unit and asked to speak with the hospitalist on call if the hospitalist that took care of you is not available. Once you are discharged, your primary care physician will handle any further medical issues. Please note that NO REFILLS for any discharge medications will be authorized once you are discharged, as it is imperative that you return to your primary care physician (or establish a relationship with a primary care physician if you do not have one) for your aftercare needs so that they can reassess your need for medications and monitor your lab values.

## 2014-12-26 NOTE — Progress Notes (Signed)
Pt refused 2000 meds and 2200 meds in front of this Probation officer and daughters. Notified oncoming nurse.

## 2014-12-26 NOTE — Progress Notes (Signed)
Patient refuses 0600 medication. He first refused it with prior RN and then again with this RN. Will pass on to on-coming nurse in the morning.

## 2014-12-26 NOTE — Consult Note (Signed)
Palliative Medicine Team at [facility]  Date: 12/26/2014   Patient Name: Andrew [name]  DOB: Jan 29, 1926  MRN: [id number]  Age / Sex: 81 y.o., male   PCP: Redmond School, MD Referring Physician: Thurnell Lose, MD  Active Problems: Principal Problem:   Elevated troponin Active Problems:   Anemia in neoplastic disease   Peripheral vascular disease with ABI's 07/2012, and L renal artery stenosis s/p PTCA   Cerebrovascular disease   Acute systolic heart failure   End stage renal disease, M/W/F   ESRD on dialysis   Chest pain   Acute decompensated heart failure   Pulmonary hypertension   Acute systolic CHF (congestive heart failure), NYHA class 3   Paroxysmal atrial fibrillation   HPI/Reason for Consultation: Andrew [name] is a 81 y.o. male with multiple chronic end stage medical problems. He has been on HD for the past 13 years. His health has declined dramatically over the past year and during this hospitalization he expressed a wish to discontinue HD, his daughters have been reluctant to support that in teh past but have come to a place of complete acceptance with allowing him to have a peaceful and comfortable death. PMT consulted for goals of care and to help with comfort care transition. Participants in Discussion: 3 daughters present for goals of care meeting.   Advance Directive: no   Code Status Orders        Start     Ordered   12/26/14 1048  Do not attempt resuscitation (DNR)   Continuous    Question Answer Comment  In the event of cardiac or respiratory ARREST Do not call a "code blue"   In the event of cardiac or respiratory ARREST Do not perform Intubation, CPR, defibrillation or ACLS   In the event of cardiac or respiratory ARREST Use medication by any route, position, wound care, and other measures to relive pain and suffering. May use oxygen, suction and manual treatment of airway obstruction as needed for comfort.      12/26/14 1047      I have reviewed  the medical record, interviewed the patient and family, and examined the patient. The following aspects are pertinent.  Past Medical History  Diagnosis Date  . ESRD on hemodialysis     Hemodialysis since 2003; Occluded access in both upper extremities and the right lower extremity, current access as of Aug 2014 is L thigh AVG. Gets HD MWF at [facility].     . Duodenal ulcer     remote  . Chronic combined systolic and diastolic CHF (congestive heart failure)     a. 02/2012 Echo: EF 65%, basal infolat AK, mild conc LVH;  b. 06/2013 Echo: EF 40-45%, mild LVH, Gr2 DD, basal inf HK->AK, Mod AS, Mild MR, PASP 73;  c. 09/2014 Echo: EF 45% basal-mid inflat and inf AK, Gr 2DD, mod AS.  Marland Kitchen Arteriosclerotic cardiovascular disease (ASCVD) 1998    a. [id number]; b. 06/2003 Cath: 4/4 patent grafts, EF 55%;  c. 09/2014 Cath: LM 99d, LADnl, LCX nl, RCA 100p, LIMA->LAD nl, VG->OM2->OM3 nl, VG->RPL nl, EF 40-45%.  . Cerebrovascular disease   . Neuropathy of foot   . Degenerative joint disease   . Gastroesophageal reflux disease   . Impaired hearing     Bilateral; hearing aids relatively ineffective  . Hypertension   . Cholelithiasis 03/2011    Diagnosed incidentally on abdominal ultrasound in 03/2011  . Hepatic steatosis   . Macrocytosis 10/20/2012  . Thrombocytopenia 10/20/2012  .  Hyperlipidemia   . Secondary hyperparathyroidism   . Peripheral vascular disease   . Gout   . Moderate aortic stenosis     a. 06/2013 Echo: Mod AS, mean grad: 25, peak grad: 57;  b. 09/2014 Echo: EF 45%, Gr 2 DD, Mod AS, valve area 1.15cm^2 (VTI), 1.1cm^2 (Vmax).  . Pulmonary hypertension     a. 06/2013 Echo: PASP 1mmHg.  Marland Kitchen Anemia     Prior blood transfusion  . Complication of anesthesia     daughter reports long episodes confusion after amnesia meds  . Renal cell carcinoma 03-14-2000    s/p right nephrectomy-2001; subsequent ESRD  . 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 Hypotension   .  Pancytopenia   . MRSA infection     a. 08/2014->10/2014 L thigh dialysis graft.  . Infection of AV graft for dialysis     a. Recurrent L thigh infxn s/p revision 10/1, I & D 11/28, and removal 11/01/2014.  Marland Kitchen Acute decompensated heart failure    History   Social History  . Marital Status: Widowed    Spouse Name: N/A    Number of Children: 3  . Years of Education: N/A   Occupational History  . Retired    Social History Main Topics  . Smoking status: Former Smoker -- 1.00 packs/day for 25 years    Types: Cigarettes    Quit date: 02/26/1984  . Smokeless tobacco: Former Systems developer     Comment: quit 1985  . Alcohol Use: 0.0 oz/week    0 Not specified per week     Comment: Rarely  . Drug Use: No  . Sexual Activity: Not Currently   Other Topics Concern  . None   Social History Narrative   Lives in [place] by himself.  Family near by.   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 Colon cancer Neg Hx   . Liver disease Neg Hx   . Anesthesia problems Neg Hx   . Heart disease Father   . Other Mother     died @ 17 with respiratory issues following hip fx  . Diabetes Mother    Scheduled Meds: . ipratropium-albuterol  3 mL Nebulization Q4H  . levalbuterol  1.25 mg Nebulization 4 times per day  . scopolamine  1 patch Transdermal Q72H   Continuous Infusions:  PRN Meds:.acetaminophen, diazepam, haloperidol, morphine CONCENTRATE No Known Allergies CBC:    Component Value Date/Time   WBC 2.8* 12/26/2014 0515   WBC 1.8* 12/15/2014 1112   HGB 8.0* 12/26/2014 0515   HGB 7.6* 12/15/2014 1112   HCT 24.8* 12/26/2014 0515   HCT 24.4* 12/15/2014 1112   PLT 139* 12/26/2014 0515   PLT 122* 12/15/2014  1112   MCV 96.9 12/26/2014 0515   MCV 101.9* 12/15/2014 1112   MCV 102 03/23/2011 0909   NEUTROABS 0.6* 12/15/2014 1112   NEUTROABS 3.3 11/27/2014 0445   LYMPHSABS 0.8* 12/15/2014 1112   LYMPHSABS 1.3 11/27/2014 0445   MONOABS 0.3 12/15/2014 1112   MONOABS 0.3 11/27/2014 0445   EOSABS 0.1 12/15/2014 1112   EOSABS  0.1 11/27/2014 0445   BASOSABS 0.0 12/15/2014 1112   BASOSABS 0.0 11/27/2014 0445   Comprehensive Metabolic Panel:    Component Value Date/Time   NA 136 12/23/2014 0732   NA 142 08/26/2013 1119   K 4.7 12/23/2014 0732   K 4.1 08/26/2013 1119   K 6.0 03/23/2011 0912   CL 98 12/23/2014 0732   CO2 27 12/23/2014 0732   CO2 33* 08/26/2013 1119   BUN 56* 12/23/2014 0732   BUN 26.8* 08/26/2013 1119   CREATININE 7.78* 12/23/2014 0732   CREATININE 5.3* 08/26/2013 1119   GLUCOSE 106* 12/23/2014 0732   GLUCOSE 104 08/26/2013 1119   CALCIUM 7.8* 12/23/2014 0732   CALCIUM 8.1* 08/26/2013 1119   CALCIUM 7.8* 03/13/2012 0527   AST 41* 12/18/2014 1124   AST 29 08/26/2013 1119   ALT 25 12/18/2014 1124   ALT 23 08/26/2013 1119   ALKPHOS 141* 12/18/2014 1124   ALKPHOS 130 08/26/2013 1119   ALKPHOS 103 03/23/2011 0912   BILITOT 0.8 12/18/2014 1124   BILITOT 0.63 08/26/2013 1119   PROT 6.4 12/18/2014 1124   PROT 6.4 08/26/2013 1119   ALBUMIN 2.6* 12/23/2014 0732   ALBUMIN 3.1* 08/26/2013 1119    Vital Signs: BP 119/40 mmHg  Pulse 80  Temp(Src) 98.5 F (36.9 C) (Oral)  Resp 21  Ht 5\' 9"  (1.753 m)  Wt 75 kg (165 lb 5.5 oz)  BMI 24.41 kg/m2  SpO2 90% Filed Weights   12/24/14 1433 12/24/14 1820 12/25/14 1859  Weight: 76.6 kg (168 lb 14 oz) 75 kg (165 lb 5.5 oz) 75 kg (165 lb 5.5 oz)   01/29 0701 - 01/30 0700 In: 300 [P.O.:100; IV Piggyback:200] Out: -   Physical Exam:  Minimally responsive. Very slightly agitated but overall appears comfortable - family report that his wheezing is better after nebs. He has very shallow respirations. Scattered Rhonchi. Abdomen is  soft. Minimal edema.  Summary of Established Goals of Care and Medical Treatment Preferences  Primary Diagnoses  1. ESRD, discontinue HD  Active Symptoms: 1. Pain 2. Dyspnea 3. Agitation/delirium  Psycho-social/Spiritual: Daughters are actively grieving- they want to take him to his home to die with the help of hospice.  Prognosis: <7 days   Palliative Performance Scale: 20%  Recommendations:  1. Code Status: DNR 2. Scope of Treatment:   1. Full comfort, no additional interventions 3. Symptom Management: 1. Roxanol SL 2. Diazepam SL 3. Haldol SL  4. Palliative Prophylaxis: Scop patch ordered, dulcolax Disposition:   Home ASAP with [facility]- important to his daughters that he die in his own home- daughters will need significant grief support.  Time In: 10AM-11AM  Time Total: 60 minutes Greater than 50%  of this time was spent counseling and coordinating care related to the above assessment and plan.  Signed by: Roma Schanz, DO  12/26/2014, 10:58 AM  Please contact Palliative Medicine Team phone at [phone] for questions and concerns.

## 2014-12-26 NOTE — Discharge Summary (Addendum)
Andrew [name], is a 81 y.o. male  DOB [birth year]  MRN [id number].  Admission date:  12/17/2014  Admitting Physician  [name], MD  Discharge Date:  12/26/2014   Primary MD  [name]., MD  Recommendations for primary care physician for things to follow:   Goal of treatment discomfort, being discharged with home hospice   Admission Diagnosis  End stage renal disease on dialysis [N18.6, Z99.2] Chest pain [R07.9] Chest pain, unspecified chest pain type [R07.9]   Discharge Diagnosis  End stage renal disease on dialysis [N18.6, Z99.2] Chest pain [R07.9] Chest pain, unspecified chest pain type [R07.9]     Principal Problem:   Elevated troponin Active Problems:   Anemia in neoplastic disease   Peripheral vascular disease with ABI's 07/2012, and L renal artery stenosis s/p PTCA   Cerebrovascular disease   Acute systolic heart failure   End stage renal disease, M/W/F   ESRD on dialysis   Chest pain   Acute decompensated heart failure   Pulmonary hypertension   Acute systolic CHF (congestive heart failure), NYHA class 3   Paroxysmal atrial fibrillation   DNR (do not resuscitate)   Hospice care      Past Medical History  Diagnosis Date  . ESRD on hemodialysis     Hemodialysis since 2003; Occluded access in both upper extremities and the right lower extremity, current access as of Aug 2014 is L thigh AVG. Gets HD MWF at [facility].     . Duodenal ulcer     remote  . Chronic combined systolic and diastolic CHF (congestive heart failure)     a. 02/2012 Echo: EF 65%, basal infolat AK, mild conc LVH;  b. 06/2013 Echo: EF 40-45%, mild LVH, Gr2 DD, basal inf HK->AK, Mod AS, Mild MR, PASP 73;  c. 09/2014 Echo: EF 45% basal-mid inflat and inf AK, Gr 2DD, mod AS.  Marland Kitchen Arteriosclerotic cardiovascular disease  (ASCVD) 1998    a. [id number]; b. 06/2003 Cath: 4/4 patent grafts, EF 55%;  c. 09/2014 Cath: LM 99d, LADnl, LCX nl, RCA 100p, LIMA->LAD nl, VG->OM2->OM3 nl, VG->RPL nl, EF 40-45%.  . Cerebrovascular disease   . Neuropathy of foot   . Degenerative joint disease   . Gastroesophageal reflux disease   . Impaired hearing     Bilateral; hearing aids relatively ineffective  . Hypertension   . Cholelithiasis 03/2011    Diagnosed incidentally on abdominal ultrasound in 03/2011  . Hepatic steatosis   . Macrocytosis 10/20/2012  . Thrombocytopenia 10/20/2012  . Hyperlipidemia   . Secondary hyperparathyroidism   . Peripheral vascular disease   . Gout   . Moderate aortic stenosis     a. 06/2013 Echo: Mod AS, mean grad: 25, peak grad: 57;  b. 09/2014 Echo: EF 45%, Gr 2 DD, Mod AS, valve area 1.15cm^2 (VTI), 1.1cm^2 (Vmax).  . Pulmonary hypertension     a. 06/2013 Echo: PASP 24mHg.  .Marland KitchenAnemia     Prior blood transfusion  . Complication of anesthesia  daughter reports long episodes confusion after amnesia meds  . Renal cell carcinoma 2001    s/p right nephrectomy-2001; subsequent ESRD  . 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 Hypotension   . Pancytopenia   . MRSA infection     a. 08/2014->10/2014 L thigh dialysis graft.  . Infection of AV graft for dialysis     a. Recurrent L thigh infxn s/p revision 10/1, I & D 11/28, and removal 11/01/2014.  Marland Kitchen Acute decompensated heart failure     Past Surgical History  Procedure Laterality Date  . Right nephrectomy  2001    Renal cell carcinoma  . Colonoscopy  01/24/2011    prominent vascular pattern, suboptimal prep but doable  . Esophagogastroduodenoscopy  01/24/2011    mild erosive reflux esophagitis, bulbar/antral erosions, bx from antrum benign  . Av fistula repair  2008    revision of anastomosis of Right AVF  . Arteriovenous graft placement  2006    Thrombectomy and interposition jump graft revision to higher axillary vein  of LUA AVG  . Coronary artery bypass graft  1998    X4 VESSELS  . Multiple cysto/ resection tumor's with bx's  LAST ONE 05-09-2011  . Multiple surg's / interventions for avgg/ fistula right upper arm  LAST REVISION 06-29-2011    (JUNE 0349 [place] VEIN/ [id number] DILATATION ANGIOPLASTY)  . Left ptc left renal artery    . Cardiac catheterization  2004  . Cataract extraction w/ intraocular lens  implant, bilateral    . Cystoscopy w/ retrogrades  12/28/2011    Procedure: CYSTOSCOPY WITH RETROGRADE PYELOGRAM;  Surgeon: Malka So, MD;  Location: [facility];  Service: Urology;  Laterality: Left;  C-ARM   . Transurethral resection of bladder tumor  12/28/2011    Procedure: TRANSURETHRAL RESECTION OF BLADDER TUMOR (TURBT);  Surgeon: Malka So, MD;  Location: [facility];  Service: Urology;  Laterality: N/A;  . Av fistula placement  02/29/2012    Procedure: INSERTION OF ARTERIOVENOUS (AV) GORE-TEX GRAFT THIGH;  Surgeon: Rosetta Posner, MD;  Location: [place];  Service: Vascular;  Laterality: Right;  Insertion right femoral arteriovenous gortex graft  . Ligation of right braciocephalic av fistula Right 04-04-2012  . Insertion of dialysis catheter  06/05/2012    Procedure: INSERTION OF DIALYSIS CATHETER;  Surgeon: Mal Misty, MD;  Location: [place];  Service: Vascular;  Laterality: Left;  Insertion diatek catheter left IJ  . Insertion of dialysis catheter  08/22/2012    Procedure: INSERTION OF DIALYSIS CATHETER;  Surgeon: Serafina Mitchell, MD;  Location: [facility];  Service: Vascular;  Laterality: Left;  insertion of dialysis catheter left  internal jugular 27cm  . Av fistula placement  10/08/2012    Procedure: INSERTION OF ARTERIOVENOUS (AV) GORE-TEX GRAFT THIGH;  Surgeon: Angelia Mould, MD;  Location: [place];  Service: Vascular;  Laterality: Left;  . Cystoscopy with biopsy N/A 08/07/2013    Procedure: CYSTOSCOPY WITH BLADDER BIOPSY;  Surgeon: Malka So,  MD;  Location: [facility];  Service: Urology;  Laterality: N/A;  . Fulguration of bladder tumor N/A 08/07/2013    Procedure: FULGURATION OF BLADDER TUMOR;  Surgeon: Malka So, MD;  Location: [facility];  Service: Urology;  Laterality: N/A;  . Revision of arteriovenous goretex graft Left 08/27/2014    Procedure: THROMBECTOMY & REVISION OF LEFT ARM  ARTERIOVENOUS GORETEX GRAFT;  Surgeon: Angelia Mould, MD;  Location: [place];  Service: Vascular;  Laterality: Left;  . Revision of arteriovenous goretex graft Left 10/01/2014    Procedure: SEGMENT OF Left THIGH ARTERIOVENOUS GORETEX GRAFT Removed;  Surgeon: Mal Misty, MD;  Location: [place];  Service: Vascular;  Laterality: Left;  . Removal of graft Left 11/01/2014    Procedure: REMOVAL OF INFECTED LEFT THIGH GRAFT;  Surgeon: Serafina Mitchell, MD;  Location: [facility];  Service: Vascular;  Laterality: Left;  removal of infected left dialysis graft.  . Left heart catheterization with coronary/graft angiogram N/A 10/27/2014    Procedure: LEFT HEART CATHETERIZATION WITH Beatrix Fetters;  Surgeon: Leonie Man, MD;  Location: [facility] CATH [facility];  Service: Cardiovascular;  Laterality: N/A;  . Tonsillectomy         History of present illness and  Hospital Course:     Kindly see H&P for history of present illness and admission details, please review complete Labs, Consult reports and Test reports for all details in brief   HPI  from the history and physical done on the day of admission  81 y/o ? 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 Alvy Bimler Transfusion dependant AoCD/Malignancy, prior infected L thigh AV graft 11/27-12/9/15, CABG 1998-Patent grafts 2004, Cp s/p Cardiac cath 10/27/14=EF40-45% [thought at time was HTN related Angina/demand ischemia], Ho Bladder CA s/p TURB 11/2011, h/o L RAS s/p PTCA hx solaitary kidney, for hypernephroma 2001 H/o CVA, suspect COPD recent admission 11/27/14-11/30/14 Decompensated HF 2/2 to  dietary indiscretion.  Patient states he was feeling fine after dialysis 1/20. They reportedly pulled 2.6 L of fluid off. He feels like they have been. Trying to drop his dry weight for the past 1-2 weeks.  He was supposed to get transfusion of 2 units PRBC yesterday however blood was not available at that time. He subsequently went to bed at around 10 PM last night took his baby aspirin and awoke this morning with chest pain at around 1 AM he took 1 nitroglycerin without relief the second sublingual nitroglycerin helped a little and the third sublingual nitroglycerin completely alleviated the pain. He has not had any further chest pain. He states that the chest pain was what brought him to the emergency room.  He reports chronic dyspnea for the past 6 months which coincides with his AV fistula infection and debility from that.  He denies any diarrhea, any cough, any sputum, any chills, any blurred vision, any double vision, any unilateral weakness, any nausea vomiting.  He in addition denies any dysuria, any abdominal pain, any dysphagia or any other issues at present time he also has no rash.    Hospital Course    Elevated troponin due to demand ischemia from severe anemia. Patient presented with troponin of 0.5, went up to 2.42, cardiology consulted. Currently on aspirin, beta blocker and statin for secondary prevention. Now goal is comfort care.  Aspiration pneumonia HCAP He is on dysphagia 2 diet with nectar thick liquids, aspiration precautions at home. Now goal is comfort care. The SPECT ongoing microaspiration.  ESRD Nephrology consulted, undergone dialysis already.Patient tells his days are Monday Wednesday and Friday. Discussed with nephrologist Dr. Jonnie Finner on 12/24/2014.  Patient expressed her wishes to Stop dialysis. Palliative care was consulted, dialysis stopped and he will be discharged with home hospice.   Anemia in neoplastic disease Patient has MDS, gets transfusion  frequently, received 4 units of packed RBC transfusion this admission. Got one more unit on 12/23/2014, follow with outpatient hematology if desired.   Dysphagia Patient seen  by SLP and recommended dysphagia 2 with neck are thick liquids.   Chronic combined diastolic and systolic CHF EF 10%. Compensated no acute issue from this standpoint.   New Onset Afib RVR  Mali VAsc 2 score 3 - Cards consulted. Poor candidate for anticoagulation agree with cardiology. Low-dose aspirin and beta blocker as tolerated.   L Thigh Wound MRSA W culture, on Vanco, [facility] called. Per vascular surgery no infection.           Discharge Condition: Stable with poor longterm prognosis   Follow UP  Follow-up Information    Follow up with [name]., MD In 1 week.   Specialty:  Internal Medicine   Contact information:   [address] [place] [address] [phone]         Discharge Instructions  and  Discharge Medications          Discharge Instructions    Care order/instruction    Complete by:  As directed   Transfuse Parameters     Discharge instructions    Complete by:  As directed   Follow with Primary MD [name]., MD as desired   Activity: As tolerated with Full fall precautions use walker/cane & assistance as needed   Disposition Home     Diet: Dysphagia 2  renal diet with nectar thick liquids with feeding assistance and aspiration precautions as needed.  For Heart failure patients - Check your Weight same time everyday, if you gain over 2 pounds, or you develop in leg swelling, experience more shortness of breath or chest pain, call your Primary MD immediately. Follow Cardiac Low Salt Diet and 1.8 lit/day fluid restriction.   On your next visit with your primary care physician please Get Medicines reviewed and adjusted.   Please request your Prim.MD to go over all Hospital Tests and Procedure/Radiological results at the follow up, please get all  Hospital records sent to your Prim MD by signing hospital release before you go home.   If you experience worsening of your admission symptoms, develop shortness of breath, life threatening emergency, suicidal or homicidal thoughts you must seek medical attention immediately by calling 911 or calling your MD immediately  if symptoms less severe.  You Must read complete instructions/literature along with all the possible adverse reactions/side effects for all the Medicines you take and that have been prescribed to you. Take any new Medicines after you have completely understood and accpet all the possible adverse reactions/side effects.   Do not drive, operating heavy machinery, perform activities at heights, swimming or participation in water activities or provide baby sitting services if your were admitted for syncope or siezures until you have seen by Primary MD or a Neurologist and advised to do so again.  Do not drive when taking Pain medications.    Do not take more than prescribed Pain, Sleep and Anxiety Medications  Special Instructions: If you have smoked or chewed Tobacco  in the last 2 yrs please stop smoking, stop any regular Alcohol  and or any Recreational drug use.  Wear Seat belts while driving.   Please note  You were cared for by a hospitalist during your hospital stay. If you have any questions about your discharge medications or the care you received while you were in the hospital after you are discharged, you can call the unit and asked to speak with the hospitalist on call if the hospitalist that took care of you is not available. Once you are discharged, your primary  care physician will handle any further medical issues. Please note that NO REFILLS for any discharge medications will be authorized once you are discharged, as it is imperative that you return to your primary care physician (or establish a relationship with a primary care physician if you do not have one) for  your aftercare needs so that they can reassess your need for medications and monitor your lab values.     Increase activity slowly    Complete by:  As directed      Practitioner attestation of consent    Complete by:  As directed   I, the ordering practitioner, attest that I have discussed with the patient the benefits, risks, side effects, alternatives, likelihood of achieving goals and potential problems during recovery for the procedure listed.  Procedure:  Blood Product(s)            Medication List    STOP taking these medications        oxyCODONE-acetaminophen 5-325 MG per tablet  Commonly known as:  PERCOCET/ROXICET      TAKE these medications        acetaminophen 500 MG tablet  Commonly known as:  TYLENOL  Take 500 mg by mouth every 6 (six) hours as needed. For pain/headache     albuterol 108 (90 BASE) MCG/ACT inhaler  Commonly known as:  PROVENTIL HFA;VENTOLIN HFA  Inhale 2 puffs into the lungs every 6 (six) hours as needed for wheezing or shortness of breath.     allopurinol 100 MG tablet  Commonly known as:  ZYLOPRIM  Take 100 mg by mouth 2 (two) times daily.     aspirin EC 81 MG tablet  Take 81 mg by mouth at bedtime.     azithromycin 250 MG tablet  Commonly known as:  ZITHROMAX Z-PAK  Take 2 tablets in the first day then 1 tablet by mouth daily until gone.     calcium carbonate 500 MG chewable tablet  Commonly known as:  TUMS - dosed in mg elemental calcium  Chew 1 tablet by mouth daily as needed for heartburn. For heartburn     cinacalcet 30 MG tablet  Commonly known as:  SENSIPAR  Take 30 mg by mouth daily with breakfast.     colchicine 0.6 MG tablet  Take 0.6 mg by mouth 2 (two) times daily as needed (for gout).     DIALYVITE TABLET Tabs  Take 1 tablet by mouth daily.     diazepam 5 MG/ML solution  Commonly known as:  VALIUM  Take 1 mL (5 mg total) by mouth every 4 (four) hours as needed for anxiety, muscle spasms or sedation.      diphenhydrAMINE-zinc acetate cream  Commonly known as:  BENADRYL  Apply topically 3 (three) times daily as needed for itching.     econazole nitrate 1 % cream  Apply 1 application topically daily as needed (Athletes foot).     gabapentin 100 MG capsule  Commonly known as:  NEURONTIN  Take 100-200 mg by mouth 2 (two) times daily. Takes 1 in the morning and 2 at night     lubriderm seriously sensitive Lotn  Apply 1 application topically daily.     cetaphil lotion  Apply 1 application topically daily.     metoprolol tartrate 25 MG tablet  Commonly known as:  LOPRESSOR  Take 0.5 tablets (12.5 mg total) by mouth 2 (two) times daily.     midodrine 10 MG tablet  Commonly known as:  PROAMATINE  Take 10-20 mg by mouth See admin instructions. On Dialysis days (M,W,F), patient takes 2 tabs in the morning and 1 in the evening and on the other days patient takes 1 tablet in the morning and 2 in teh evening     MIRALAX powder  Generic drug:  polyethylene glycol powder  Take 17 g by mouth at bedtime.     morphine CONCENTRATE 10 MG/0.5ML Soln concentrated solution  Take 0.5 mLs (10 mg total) by mouth every hour as needed for moderate pain, severe pain, anxiety or shortness of breath.     nitroGLYCERIN 0.4 MG SL tablet  Commonly known as:  NITROSTAT  Place 1 tablet (0.4 mg total) under the tongue every 5 (five) minutes as needed for chest pain.     Eugene  Add to make liquid nectar thick     sevelamer carbonate 800 MG tablet  Commonly known as:  RENVELA  Take 800-1,600 mg by mouth See admin instructions. Take 2 tabs with each meals and 1 tab with snacks          Diet and Activity recommendation: See Discharge Instructions above   Consults obtained - Renal, Pall Care, Cards   Major procedures and Radiology Reports - PLEASE review detailed and final reports for all details, in brief -       Dg Chest 2 View  12/17/2014   CLINICAL DATA:  Chest pain last  night. The pain was relieved with sublingual nitroglycerin.  EXAM: CHEST  2 VIEW  COMPARISON:  11/27/2014, 11/18/2014, 11/07/2014  FINDINGS: There is prior sternotomy and CABG. There is a right axillary-subclavian vascular stent. There are surgical clips in the left axilla.  There is no airspace consolidation or alveolar edema. There is mild vascular and interstitial thickening consistent with a minor degree of congestive failure. This is not as severe as on 11/27/2014. There is a tiny left pleural effusion. This is smaller than on 12/15/2014. Hilar, mediastinal and cardiac contours appear unremarkable. There is atherosclerotic calcification in the aorta.  IMPRESSION: Findings consistent with a mild degree of congestive failure with a very small left pleural effusion.   Electronically Signed   By: Andreas Newport M.D.   On: 12/17/2014 12:35   Dg Chest 2 View  11/27/2014   CLINICAL DATA:  Shortness of breath and chest pain  EXAM: CHEST  2 VIEW  COMPARISON:  11/18/2014  FINDINGS: Small bilateral pleural effusions. No overt pulmonary edema. Mild cardiomegaly that is stable from prior. The aorta and hila are negative. The patient is status post CABG. Surgical changes to the left axilla. Prior right subclavian stenting.  IMPRESSION: Pulmonary venous congestion with small pleural effusions.   Electronically Signed   By: Jorje Guild M.D.   On: 11/27/2014 06:16   Dg Chest Port 1 View  12/24/2014   CLINICAL DATA:  Difficulty breathing and wheezing  EXAM: PORTABLE CHEST - 1 VIEW  COMPARISON:  December 23, 2014  FINDINGS: There is underlying emphysema. There is mild generalized interstitial prominence, felt to represent edema. No airspace consolidation. Heart is enlarged with pulmonary vascularity within normal limits. There are surgical clips in left axilla. There is a subclavian vein stent on the right. Patient is status post coronary artery bypass grafting. No apparent adenopathy.  IMPRESSION: Evidence of a degree  of congestive heart failure superimposed on emphysematous change. No airspace consolidation.   Electronically Signed   By: Lowella Grip M.D.   On: 12/24/2014 12:00   Dg Chest  Port 1 View  12/23/2014   CLINICAL DATA:  Shortness of breath.  EXAM: PORTABLE CHEST - 1 VIEW  COMPARISON:  December 21, 2014.  FINDINGS: Stable cardiomediastinal silhouette. Status post coronary artery bypass graft. No pneumothorax or pleural effusion is noted. No acute pulmonary disease is noted. Bony thorax appears intact.  IMPRESSION: No acute cardiopulmonary abnormality seen.   Electronically Signed   By: Sabino Dick M.D.   On: 12/23/2014 12:11   Dg Chest Port 1 View  12/21/2014   CLINICAL DATA:  Aspiration.  EXAM: PORTABLE CHEST - 1 VIEW  COMPARISON:  12/20/2014.  FINDINGS: Mediastinum and hilar structures normal. Prior CABG. Stable cardiomegaly and pulmonary vascular congestion with stable mild interstitial prominence. These findings suggest congestive heart failure. Pneumonitis project in the right lung base cannot be excluded. No pleural effusion or pneumothorax. Right subclavian vascular stents are noted. The stents are in and angled position.  IMPRESSION: 1. Prior CABG. Persistent changes of mild congestive heart failure. Pneumonitis cannot be excluded, particularly in the right lung base. No interim change from prior exam . 2. Right subclavian vascular stents are noted. They are in an angled position.   Electronically Signed   By: Marcello Moores  Register   On: 12/21/2014 07:32   Dg Chest Port 1 View  12/20/2014   CLINICAL DATA:  Chest pain and shortness of Breath  EXAM: PORTABLE CHEST - 1 VIEW  COMPARISON:  12/17/2014  FINDINGS: The heart size is mildly enlarged. Atherosclerotic calcification noted within the aortic arch. Previous median sternotomy and CABG procedure. Moderate pulmonary vascular congestion is noted. No airspace consolidation.  IMPRESSION: Mild cardiac enlargement and pulmonary vascular congestion    Electronically Signed   By: Kerby Moors M.D.   On: 12/20/2014 11:29   Dg Chest Port 1v Same Day  12/25/2014   CLINICAL DATA:  81 year old with pulmonary edema.  EXAM: PORTABLE CHEST - 1 VIEW SAME DAY  COMPARISON:  12/24/2014  FINDINGS: Again noted is a vascular stent in the right axillary vein region. Patchy densities in the lungs may represent mild edema and minimally changed. Heart size is upper limits of normal and stable. Evidence of prior CABG procedure. Atherosclerotic calcifications at the aortic arch.  IMPRESSION: Stable chest radiograph findings with evidence of mild edema. Minimal change from the previous examination.   Electronically Signed   By: Markus Daft M.D.   On: 12/25/2014 09:29   Dg Swallowing Func-speech Pathology  12/21/2014   Lamar Sprinkles, CCC-SLP     12/21/2014  2:51 PM  Objective Swallowing Evaluation: Modified Barium Swallowing Study   Patient Details  Name: CYPRESS FANFAN MRN: [id number] Date of Birth: January 15, 1926  Today's Date: 12/21/2014 Time: SLP Start Time (ACUTE ONLY): 0135-SLP Stop Time (ACUTE  ONLY): 0200 SLP Time Calculation (min) (ACUTE ONLY): 25 min  Past Medical History:  Past Medical History  Diagnosis Date  . ESRD on hemodialysis     Hemodialysis since 2003; Occluded access in both upper  extremities and the right lower extremity, current access as of  Aug 2014 is L thigh AVG. Gets HD MWF at [facility].     . Duodenal ulcer     remote  . Chronic combined systolic and diastolic CHF (congestive heart  failure)     a. 02/2012 Echo: EF 65%, basal infolat AK, mild conc LVH;  b.  06/2013 Echo: EF 40-45%, mild LVH, Gr2 DD, basal inf HK->AK, Mod  AS, Mild MR, PASP 73;  c. 09/2014 Echo: EF 45%  basal-mid inflat  and inf AK, Gr 2DD, mod AS.  Marland Kitchen Arteriosclerotic cardiovascular disease (ASCVD) 1998    a. [id number]; b. 06/2003 Cath: 4/4 patent grafts, EF 55%;  c.  09/2014 Cath: LM 99d, LADnl, LCX nl, RCA 100p, LIMA->LAD nl,  VG->OM2->OM3 nl, VG->RPL nl, EF 40-45%.  .  Cerebrovascular disease   . Neuropathy of foot   . Degenerative joint disease   . Gastroesophageal reflux disease   . Impaired hearing     Bilateral; hearing aids relatively ineffective  . Hypertension   . Cholelithiasis 03/2011    Diagnosed incidentally on abdominal ultrasound in 03/2011  . Hepatic steatosis   . Macrocytosis 10/20/2012  . Thrombocytopenia 10/20/2012  . Hyperlipidemia   . Secondary hyperparathyroidism   . Peripheral vascular disease   . Gout   . Moderate aortic stenosis     a. 06/2013 Echo: Mod AS, mean grad: 25, peak grad: 57;  b.  09/2014 Echo: EF 45%, Gr 2 DD, Mod AS, valve area 1.15cm^2 (VTI),  1.1cm^2 (Vmax).  . Pulmonary hypertension     a. 06/2013 Echo: PASP 44mHg.  .Marland KitchenAnemia     Prior blood transfusion  . Complication of anesthesia     daughter reports long episodes confusion after amnesia meds  . Renal cell carcinoma 2001    s/p right nephrectomy-2001; subsequent ESRD  . 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 Hypotension   . Pancytopenia   . MRSA infection     a. 08/2014->10/2014 L thigh dialysis graft.  . Infection of AV graft for dialysis     a. Recurrent L thigh infxn s/p revision 10/1, I & D 11/28, and  removal 11/01/2014.  .Marland KitchenAcute decompensated heart failure    Past Surgical History:  Past Surgical History  Procedure Laterality Date  . Right nephrectomy  2001    Renal cell carcinoma  . Colonoscopy  01/24/2011    prominent vascular pattern, suboptimal prep but doable  . Esophagogastroduodenoscopy  01/24/2011    mild erosive reflux esophagitis, bulbar/antral erosions, bx  from antrum benign  . Av fistula repair  2008    revision of anastomosis of Right AVF  . Arteriovenous graft placement  2006    Thrombectomy and interposition jump graft revision to higher  axillary vein of LUA AVG  . Coronary artery bypass graft  1998    X4 VESSELS  . Multiple cysto/ resection tumor's with bx's  LAST ONE  05-09-2011  . Multiple surg's / interventions for avgg/ fistula right upper  arm   LAST REVISION 06-29-2011    ([id number]/ [id number]  ANGIOPLASTY)  . Left ptc left renal artery    . Cardiac catheterization  2004  . Cataract extraction w/ intraocular lens  implant, bilateral    . Cystoscopy w/ retrogrades  12/28/2011    Procedure: CYSTOSCOPY WITH RETROGRADE PYELOGRAM;  Surgeon: JMalka So MD;  [facility]  Service:  Urology;  Laterality: Left;  C-ARM   . Transurethral resection of bladder tumor  12/28/2011    Procedure: TRANSURETHRAL RESECTION OF BLADDER TUMOR (TURBT);   Surgeon: JMalka So MD;  [facility]   Service: Urology;  Laterality: N/A;  . Av fistula placement  02/29/2012    Procedure: INSERTION OF ARTERIOVENOUS (AV) GORE-TEX GRAFT  THIGH;  Surgeon: TRosetta Posner MD;  [facility]  Service:  Vascular;  Laterality: Right;  Insertion right femoral  arteriovenous gortex graft  .  Ligation of right braciocephalic av fistula Right 04-04-2012  . Insertion of dialysis catheter  06/05/2012    Procedure: INSERTION OF DIALYSIS CATHETER;  Surgeon: Mal Misty, MD;  Location: [place];  Service: Vascular;  Laterality:  Left;  Insertion diatek catheter left IJ  . Insertion of dialysis catheter  08/22/2012    Procedure: INSERTION OF DIALYSIS CATHETER;  Surgeon: Serafina Mitchell, MD;  Location: [facility];  Service: Vascular;   Laterality: Left;  insertion of dialysis catheter left  internal  jugular 27cm  . Av fistula placement  10/08/2012    Procedure: INSERTION OF ARTERIOVENOUS (AV) GORE-TEX GRAFT  THIGH;  Surgeon: Angelia Mould, MD;  Location: [place];   Service: Vascular;  Laterality: Left;  . Cystoscopy with biopsy N/A 08/07/2013    Procedure: CYSTOSCOPY WITH BLADDER BIOPSY;  Surgeon: Malka So, MD;  Location: [facility];  Service: Urology;  Laterality:  N/A;  . Fulguration of bladder tumor N/A 08/07/2013    Procedure: FULGURATION OF BLADDER TUMOR;  Surgeon: Malka So, MD;  Location: [facility];  Service:  Urology;  Laterality:  N/A;  . Revision of arteriovenous goretex graft Left 08/27/2014    Procedure: THROMBECTOMY & REVISION OF LEFT ARM  ARTERIOVENOUS  GORETEX GRAFT;  Surgeon: Angelia Mould, MD;  Location: [place];  Service: Vascular;  Laterality: Left;  . Revision of arteriovenous goretex graft Left 10/01/2014    Procedure: SEGMENT OF Left THIGH ARTERIOVENOUS GORETEX GRAFT  Removed;  Surgeon: Mal Misty, MD;  Location: [place];   Service: Vascular;  Laterality: Left;  . Removal of graft Left 11/01/2014    Procedure: REMOVAL OF INFECTED LEFT THIGH GRAFT;  Surgeon:  Serafina Mitchell, MD;  Location: [facility];  Service: Vascular;   Laterality: Left;  removal of infected left dialysis graft.  . Left heart catheterization with coronary/graft angiogram N/A  10/27/2014    Procedure: LEFT HEART CATHETERIZATION WITH Beatrix Fetters;  Surgeon: Leonie Man, MD;  [facility];   Service: Cardiovascular;  Laterality: N/A;  . Tonsillectomy     HPI:  HPI: 81 y/o ? ESRD m/w/f,  H/o CVA, suspect COPD recent admission  11/27/14-11/30/14 Decompensated HF 2/2 to dietary indiscretion  No Data Recorded  Assessment / Plan / Recommendation CHL IP CLINICAL IMPRESSIONS 12/21/2014  Dysphagia Diagnosis (None)  Clinical impression Pt with oral/pharyngeal dysphagia  characterized by delayed oral transit, uncoordinated mastication,  delay in the swallow trigger, valleculae residue and pharyngeal  weakness.  Penetration occured during the swallow impacted by  decreased epiglottic inversion due to sensory and motor deficits.   Chin tuck was not effective to eliminate penetration with thin  liquid boluses from a cup.  Esophageal sweep revealed it mildly  slow to clear.  Rx begin dysphagia 2 diet with nectar thick  liquids.  Pt to be totally upright for all intake and should not  use straws.        CHL IP TREATMENT RECOMMENDATION 12/21/2014  Treatment Plan Recommendations Therapy as outlined in treatment  plan below     CHL IP  DIET RECOMMENDATION 12/21/2014  Diet Recommendations Dysphagia 2 (Fine chop);Nectar-thick liquid  Liquid Administration via Cup;No straw  Medication Administration Whole meds with puree  Compensations Slow rate;Small sips/bites;Check for anterior  loss;Follow solids with liquid  Postural Changes and/or Swallow Maneuvers Out of bed for  meals;Seated upright 90 degrees;Upright 30-60 min after meal     CHL IP  OTHER RECOMMENDATIONS 12/21/2014  Recommended Consults (None)  Oral Care Recommendations Oral care Q4 per protocol  Other Recommendations Order thickener from pharmacy;Prohibited  food (jello, ice cream, thin soups);Remove water pitcher     CHL IP FOLLOW UP RECOMMENDATIONS 12/21/2014  Follow up Recommendations Home health SLP     CHL IP FREQUENCY AND DURATION 12/21/2014  Speech Therapy Frequency (ACUTE ONLY) min 2x/week  Treatment Duration 2 weeks     Pertinent Vitals/Pain None verbalized    SLP Swallow Goals No flowsheet data found.  No flowsheet data found.    CHL IP REASON FOR REFERRAL 12/21/2014  Reason for Referral Objectively evaluate swallowing function     CHL IP ORAL PHASE 12/21/2014  Lips (None)  Tongue (None)  Mucous membranes (None)  Nutritional status (None)  Other (None)  Oxygen therapy (None)  Oral Phase Impaired  Oral - Pudding Teaspoon WFL;Delayed oral transit  Oral - Pudding Cup (None)  Oral - Honey Teaspoon (None)  Oral - Honey Cup (None)  Oral - Honey Syringe (None)  Oral - Nectar Teaspoon (None)  Oral - Nectar Cup Right anterior bolus loss  Oral - Nectar Straw (None)  Oral - Nectar Syringe (None)  Oral - Ice Chips (None)  Oral - Thin Teaspoon Right anterior bolus loss  Oral - Thin Cup Right anterior bolus loss  Oral - Thin Straw (None)  Oral - Thin Syringe (None)  Oral - Puree (None)  Oral - Mechanical Soft (None)  Oral - Regular Weak lingual manipulation;Impaired  mastication;Delayed oral transit  Oral - Multi-consistency (None)  Oral - Pill (None)  Oral Phase - Comment Pt with some tongue base  residue for purees  that would spill into the vallecula.        CHL IP PHARYNGEAL PHASE 12/21/2014  Pharyngeal Phase Impaired Pharyngeal - Pudding Teaspoon Delayed  swallow initiation;Reduced epiglottic inversion;Premature  spillage to valleculae;Pharyngeal residue - valleculae  Penetration/Aspiration details (pudding teaspoon) (None)  Pharyngeal - Pudding Cup (None)  Penetration/Aspiration details (pudding cup) (None)  Pharyngeal - Honey Teaspoon (None)  Penetration/Aspiration details (honey teaspoon) (None)  Pharyngeal - Honey Cup (None)  Penetration/Aspiration details (honey cup) (None)  Pharyngeal - Honey Syringe (None)  Penetration/Aspiration details (honey syringe) (None)  Pharyngeal - Nectar Teaspoon (None)  Penetration/Aspiration details (nectar teaspoon) (None)  Pharyngeal - Nectar Cup Delayed swallow initiation;Reduced  epiglottic inversion;Pharyngeal residue - valleculae;Premature  spillage to valleculae  Penetration/Aspiration details (nectar cup) (None)  Pharyngeal - Nectar Straw (None)  Penetration/Aspiration details (nectar straw) (None)  Pharyngeal - Nectar Syringe (None)  Penetration/Aspiration details (nectar syringe) (None)  Pharyngeal - Ice Chips (None)  Penetration/Aspiration details (ice chips) (None)  Pharyngeal - Thin Teaspoon Delayed swallow initiation;Reduced  epiglottic inversion;Pharyngeal residue - valleculae;Premature  spillage to valleculae;Premature spillage to pyriform sinuses  Penetration/Aspiration details (thin teaspoon) (None)  Pharyngeal - Thin Cup Delayed swallow initiation;Premature  spillage to valleculae;Premature spillage to pyriform  sinuses;Reduced epiglottic inversion;Penetration/Aspiration  during swallow;Trace aspiration;Pharyngeal residue - valleculae  Penetration/Aspiration details (thin cup) Material enters airway,  remains ABOVE vocal cords and not ejected out  Pharyngeal - Thin Straw (None)  Penetration/Aspiration details (thin straw) (None)  Pharyngeal - Thin  Syringe (None)  Penetration/Aspiration details (thin syringe') (None)  Pharyngeal - Puree Delayed swallow initiation;Premature spillage  to valleculae;Reduced epiglottic inversion;Pharyngeal residue -  valleculae  Penetration/Aspiration details (puree) (None)  Pharyngeal - Mechanical Soft (None)  Penetration/Aspiration details (mechanical soft) (None)  Pharyngeal - Regular Delayed swallow initiation;Premature  spillage to valleculae;Reduced epiglottic inversion;Pharyngeal  residue - valleculae  Penetration/Aspiration details (regular) (None)  Pharyngeal - Multi-consistency (None)  Penetration/Aspiration details (multi-consistency) (None)  Pharyngeal - Pill (None)  Penetration/Aspiration details (pill) (None)  Pharyngeal Comment (None)     CHL IP CERVICAL ESOPHAGEAL PHASE 12/21/2014  Cervical Esophageal Phase WFL  Pudding Teaspoon (None)  Pudding Cup (None)  Honey Teaspoon (None)  Honey Cup (None)  Honey Syringe (None)  Nectar Teaspoon (None)  Nectar Cup (None)  Nectar Straw (None)  Nectar Syringe (None)  Thin Teaspoon (None)  Thin Cup (None)  Thin Straw (None)  Thin Syringe (None)  Cervical Esophageal Comment (None)    No flowsheet data found.         Lamar Sprinkles 12/21/2014, 2:49 PM  Shelly Flatten, MA, CCC-SLP Acute Rehab SLP [phone]      Micro Results      Recent Results (from the past 240 hour(s))  Wound culture     Status: None   Collection Time: 12/20/14  6:20 PM  Result Value Ref Range Status   Specimen Description WOUND LEFT THIGH GRAFT  Final   Special Requests NONE  Final   Gram Stain   Final    NO WBC SEEN FEW SQUAMOUS EPITHELIAL CELLS PRESENT ABUNDANT GRAM POSITIVE COCCI IN PAIRS Performed at [facility]    Culture   Final    ABUNDANT METHICILLIN RESISTANT STAPHYLOCOCCUS AUREUS Note: RIFAMPIN AND GENTAMICIN SHOULD NOT BE USED AS SINGLE DRUGS FOR TREATMENT OF STAPH INFECTIONS. This organism DOES NOT demonstrate inducible Clindamycin resistance in vitro. CRITICAL RESULT  CALLED TO, READ BACK BY AND VERIFIED WITH: J CLEAVER RN  12/23/14 AT 54 AM BY [facility] Performed at [facility]    Report Status 12/23/2014 FINAL  Final   Organism ID, Bacteria METHICILLIN RESISTANT STAPHYLOCOCCUS AUREUS  Final      Susceptibility   Methicillin resistant staphylococcus aureus - MIC*    CLINDAMYCIN <=0.25 SENSITIVE Sensitive     ERYTHROMYCIN >=8 RESISTANT Resistant     GENTAMICIN <=0.5 SENSITIVE Sensitive     LEVOFLOXACIN >=8 RESISTANT Resistant     OXACILLIN >=4 RESISTANT Resistant     PENICILLIN >=0.5 RESISTANT Resistant     RIFAMPIN <=0.5 SENSITIVE Sensitive     TRIMETH/SULFA <=10 SENSITIVE Sensitive     VANCOMYCIN 1 SENSITIVE Sensitive     TETRACYCLINE <=1 SENSITIVE Sensitive     * ABUNDANT METHICILLIN RESISTANT STAPHYLOCOCCUS AUREUS  Culture, blood (routine x 2)     Status: None (Preliminary result)   Collection Time: 12/20/14  7:20 PM  Result Value Ref Range Status   Specimen Description BLOOD LEFT HAND  Final   Special Requests BOTTLES DRAWN AEROBIC ONLY [place]  Final   Culture   Final           BLOOD CULTURE RECEIVED NO GROWTH TO DATE CULTURE WILL BE HELD FOR 5 DAYS BEFORE ISSUING A FINAL NEGATIVE REPORT Performed at [facility]    Report Status PENDING  Incomplete  Culture, blood (routine x 2)     Status: None (Preliminary result)   Collection Time: 12/20/14  7:55 PM  Result Value Ref Range Status   Specimen Description BLOOD LEFT THIGH  Final   Special Requests BOTTLES DRAWN AEROBIC AND ANAEROBIC 10 CC EA  Final   Culture   Final           BLOOD CULTURE RECEIVED NO GROWTH TO DATE CULTURE WILL BE HELD FOR 5 DAYS BEFORE ISSUING A FINAL NEGATIVE REPORT Performed at [facility]  Report Status PENDING  Incomplete       Today   Subjective:   Andrew [name] today has no headache,no chest abdominal pain,no new weakness tingling or numbness, feels much better wants to go home today.    Objective:   Blood pressure 119/40, pulse  80, temperature 98.5 F (36.9 C), temperature source Oral, resp. rate 21, height _0  (1.753 m), weight 75 kg (165 lb 5.5 oz), SpO2 92 %.   Intake/Output Summary (Last 24 hours) at 12/26/14 1238 Last data filed at 12/26/14 1003  Gross per 24 hour  Intake    400 ml  Output      0 ml  Net    400 ml    Exam Awake Alert, Oriented x 3, No new F.N deficits, Normal affect [place].AT,PERRAL Supple Neck,No JVD, No cervical lymphadenopathy appriciated.  Symmetrical Chest wall movement, Good air movement bilaterally, CTAB RRR,No Gallops,Rubs or new Murmurs, No Parasternal Heave +ve B.Sounds, Abd Soft, Non tender, No organomegaly appriciated, No rebound -guarding or rigidity. No Cyanosis, Clubbing or edema, No new Rash or bruise  Data Review   CBC w Diff:  Lab Results  Component Value Date   WBC 2.8* 12/26/2014   WBC 1.8* 12/15/2014   HGB 8.0* 12/26/2014   HGB 7.6* 12/15/2014   HCT 24.8* 12/26/2014   HCT 24.4* 12/15/2014   PLT 139* 12/26/2014   PLT 122* 12/15/2014   LYMPHOPCT 45.1 12/15/2014   LYMPHOPCT 25 11/27/2014   BANDSPCT 0 07/01/2013   MONOPCT 14.3* 12/15/2014   MONOPCT 5 11/27/2014   EOSPCT 8.0* 12/15/2014   EOSPCT 2 11/27/2014   BASOPCT 1.7 12/15/2014   BASOPCT 0 11/27/2014    CMP:  Lab Results  Component Value Date   NA 136 12/23/2014   NA 142 08/26/2013   K 4.7 12/23/2014   K 4.1 08/26/2013   K 6.0 03/23/2011   CL 98 12/23/2014   CO2 27 12/23/2014   CO2 33* 08/26/2013   BUN 56* 12/23/2014   BUN 26.8* 08/26/2013   CREATININE 7.78* 12/23/2014   CREATININE 5.3* 08/26/2013   PROT 6.4 12/18/2014   PROT 6.4 08/26/2013   ALBUMIN 2.6* 12/23/2014   ALBUMIN 3.1* 08/26/2013   BILITOT 0.8 12/18/2014   BILITOT 0.63 08/26/2013   ALKPHOS 141* 12/18/2014   ALKPHOS 130 08/26/2013   ALKPHOS 103 03/23/2011   AST 41* 12/18/2014   AST 29 08/26/2013   ALT 25 12/18/2014   ALT 23 08/26/2013  .   Total Time in preparing paper work, data evaluation and todays exam - 35  minutes  Thurnell Lose M.D on 12/26/2014 at 12:38 PM  [facility] Office  [phone]

## 2014-12-26 NOTE — Progress Notes (Signed)
Patient is now full comfort care, no further dialysis.  Appreciate care of [facility] team and nursing staff. Will sign off, please call as needed.   Kelly Splinter MD (pgr) [phone]    [phone] 12/26/2014, 10:43 AM

## 2014-12-26 NOTE — Progress Notes (Signed)
Patient ID: Andrew [name], male   DOB: Aug 23, 1926, 81 y.o.   MRN: [id number]   Chart reviewed, including most recent note and recs per Dr Acie Fredrickson from 12/25/14. Continue medical therapy for chronic afib, CAD, and chronic combined systolic/diastolic HF. No further cardiac inpatient recs, will signoff. Further management by primary team and palliative care.     Zandra Abts MD

## 2014-12-27 LAB — TYPE AND SCREEN
ABO/RH(D): O POS
Antibody Screen: NEGATIVE
Unit division: 0
Unit division: 0

## 2014-12-27 LAB — CULTURE, BLOOD (ROUTINE X 2)
CULTURE: NO GROWTH
Culture: NO GROWTH

## 2014-12-28 ENCOUNTER — Encounter: Payer: Medicare Other | Admitting: Surgery

## 2014-12-29 ENCOUNTER — Telehealth: Payer: Self-pay | Admitting: *Deleted

## 2014-12-29 ENCOUNTER — Ambulatory Visit: Payer: Medicare Other

## 2014-12-29 ENCOUNTER — Other Ambulatory Visit: Payer: Medicare Other

## 2014-12-29 NOTE — Telephone Encounter (Signed)
Called patient's daughter about appointment that he missed today.  According to [place] her father passed away last night.  Will pass information on to MD and cancel all appointments.

## 2015-01-05 ENCOUNTER — Ambulatory Visit: Payer: Medicare Other

## 2015-01-05 ENCOUNTER — Other Ambulatory Visit: Payer: Medicare Other

## 2015-01-12 ENCOUNTER — Ambulatory Visit: Payer: Medicare Other

## 2015-01-12 ENCOUNTER — Other Ambulatory Visit: Payer: Medicare Other

## 2015-01-19 ENCOUNTER — Ambulatory Visit: Payer: Medicare Other | Admitting: Hematology and Oncology

## 2015-01-19 ENCOUNTER — Ambulatory Visit: Payer: Medicare Other

## 2015-01-19 ENCOUNTER — Other Ambulatory Visit: Payer: Medicare Other

## 2015-01-26 DEATH — deceased

## 2015-02-09 ENCOUNTER — Ambulatory Visit: Payer: Medicare Other | Admitting: Cardiovascular Disease

## 2015-05-07 ENCOUNTER — Other Ambulatory Visit: Payer: Self-pay | Admitting: Hematology and Oncology

## 2015-05-24 ENCOUNTER — Other Ambulatory Visit: Payer: Self-pay

## 2015-11-29 IMAGING — CR DG CHEST 2V
2 series · 2 of 2 positions shown · non-contrast
Comparison: 11/27/2014, 11/18/2014, 11/07/2014

CLINICAL DATA: Chest pain last night. The pain was relieved with
sublingual nitroglycerin.

EXAM:
CHEST  2 VIEW

[w chest pa]
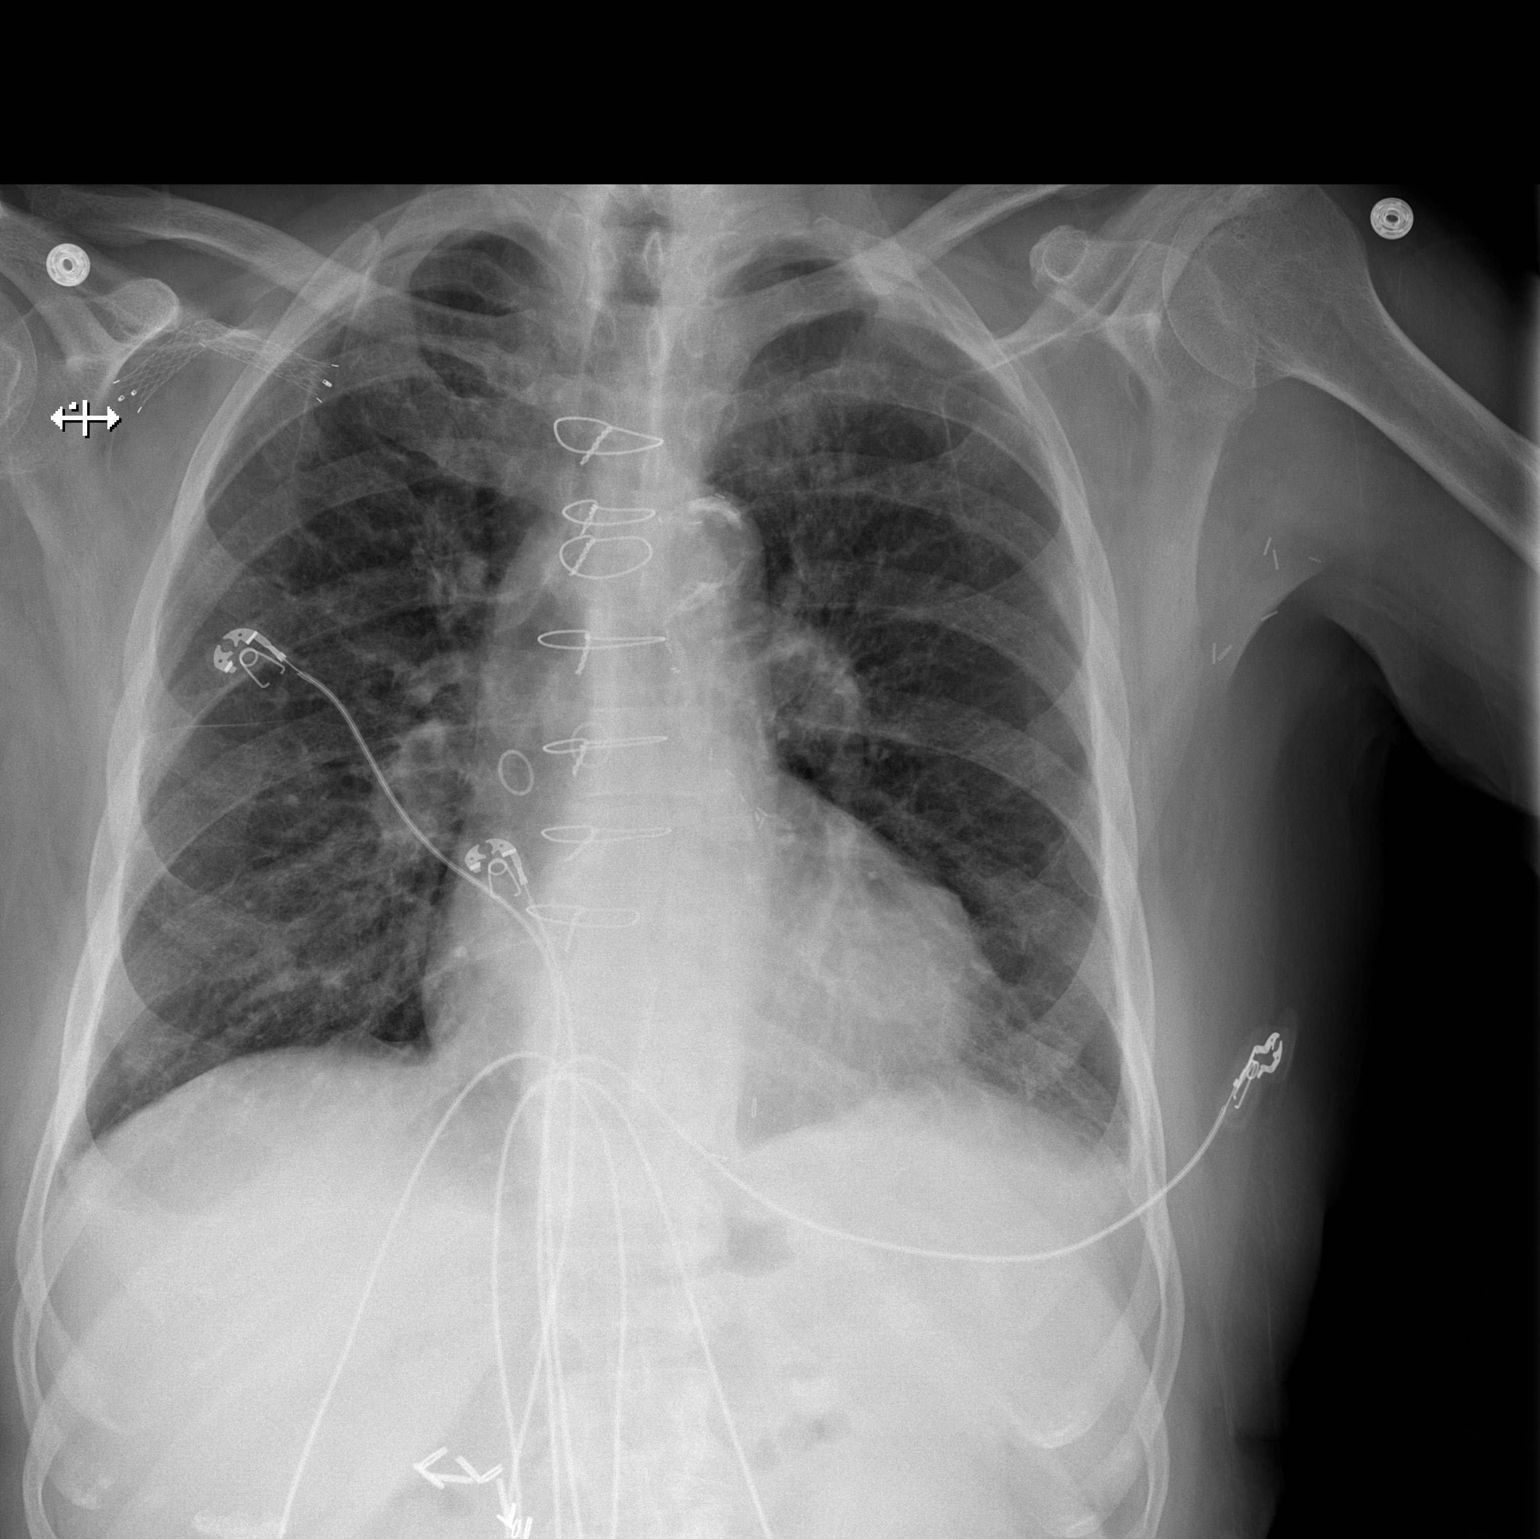

[w chest lat]
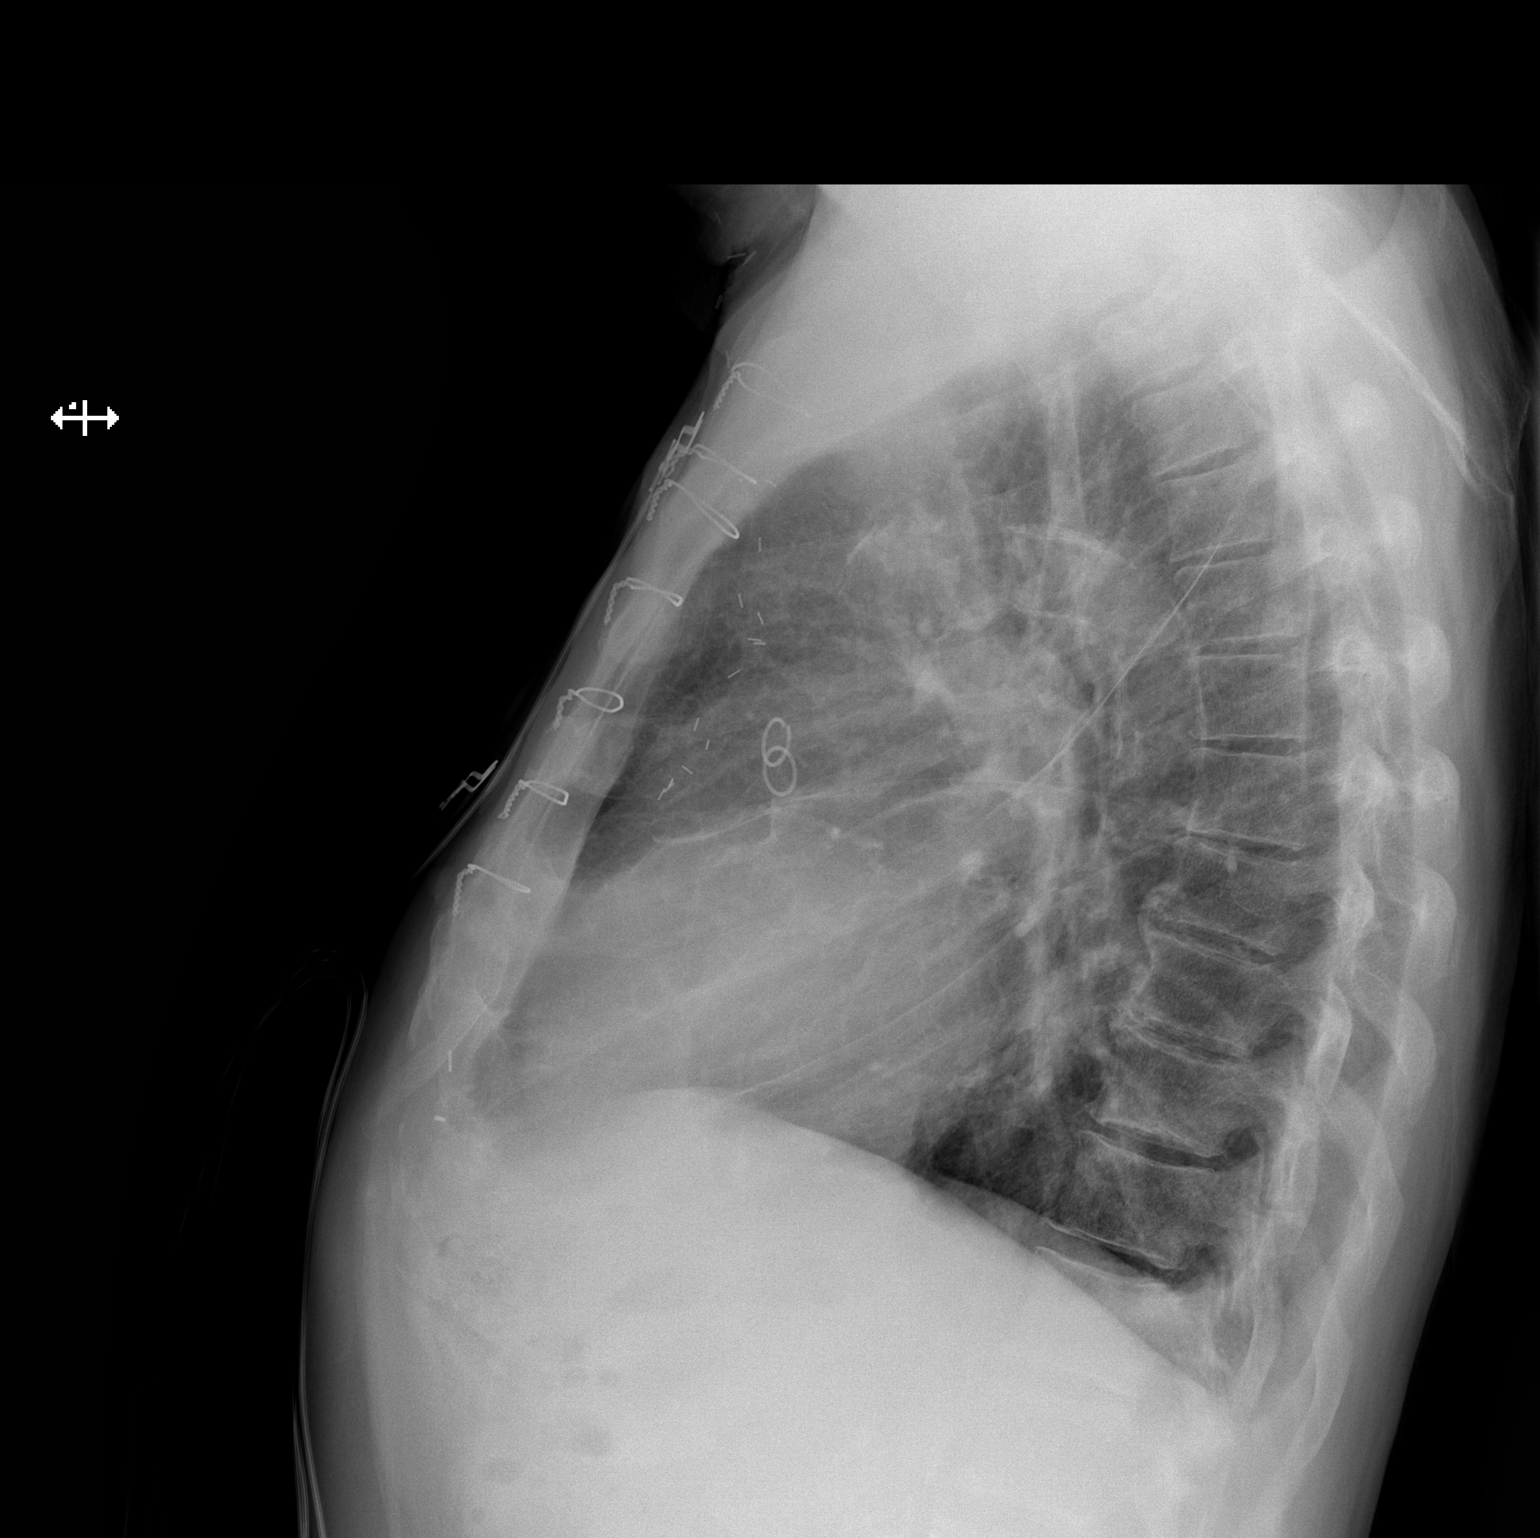

[2 of 2 positions shown; findings below may reference images not displayed]

FINDINGS: There is prior sternotomy and CABG. There is a right
axillary-subclavian vascular stent. There are surgical clips in the
left axilla.

There is no airspace consolidation or alveolar edema. There is mild
vascular and interstitial thickening consistent with a minor degree
of congestive failure. This is not as severe as on 11/27/2014. There
is a tiny left pleural effusion. This is smaller than on 12/15/2014.
Hilar, mediastinal and cardiac contours appear unremarkable. There
is atherosclerotic calcification in the aorta.
IMPRESSION: Findings consistent with a mild degree of congestive failure with a
very small left pleural effusion.

## 2015-12-02 IMAGING — CR DG CHEST 1V PORT
1 series · 1 of 1 positions shown · non-contrast
Comparison: 12/17/2014

CLINICAL DATA: Chest pain and shortness of Breath

EXAM:
PORTABLE CHEST - 1 VIEW

[AP]
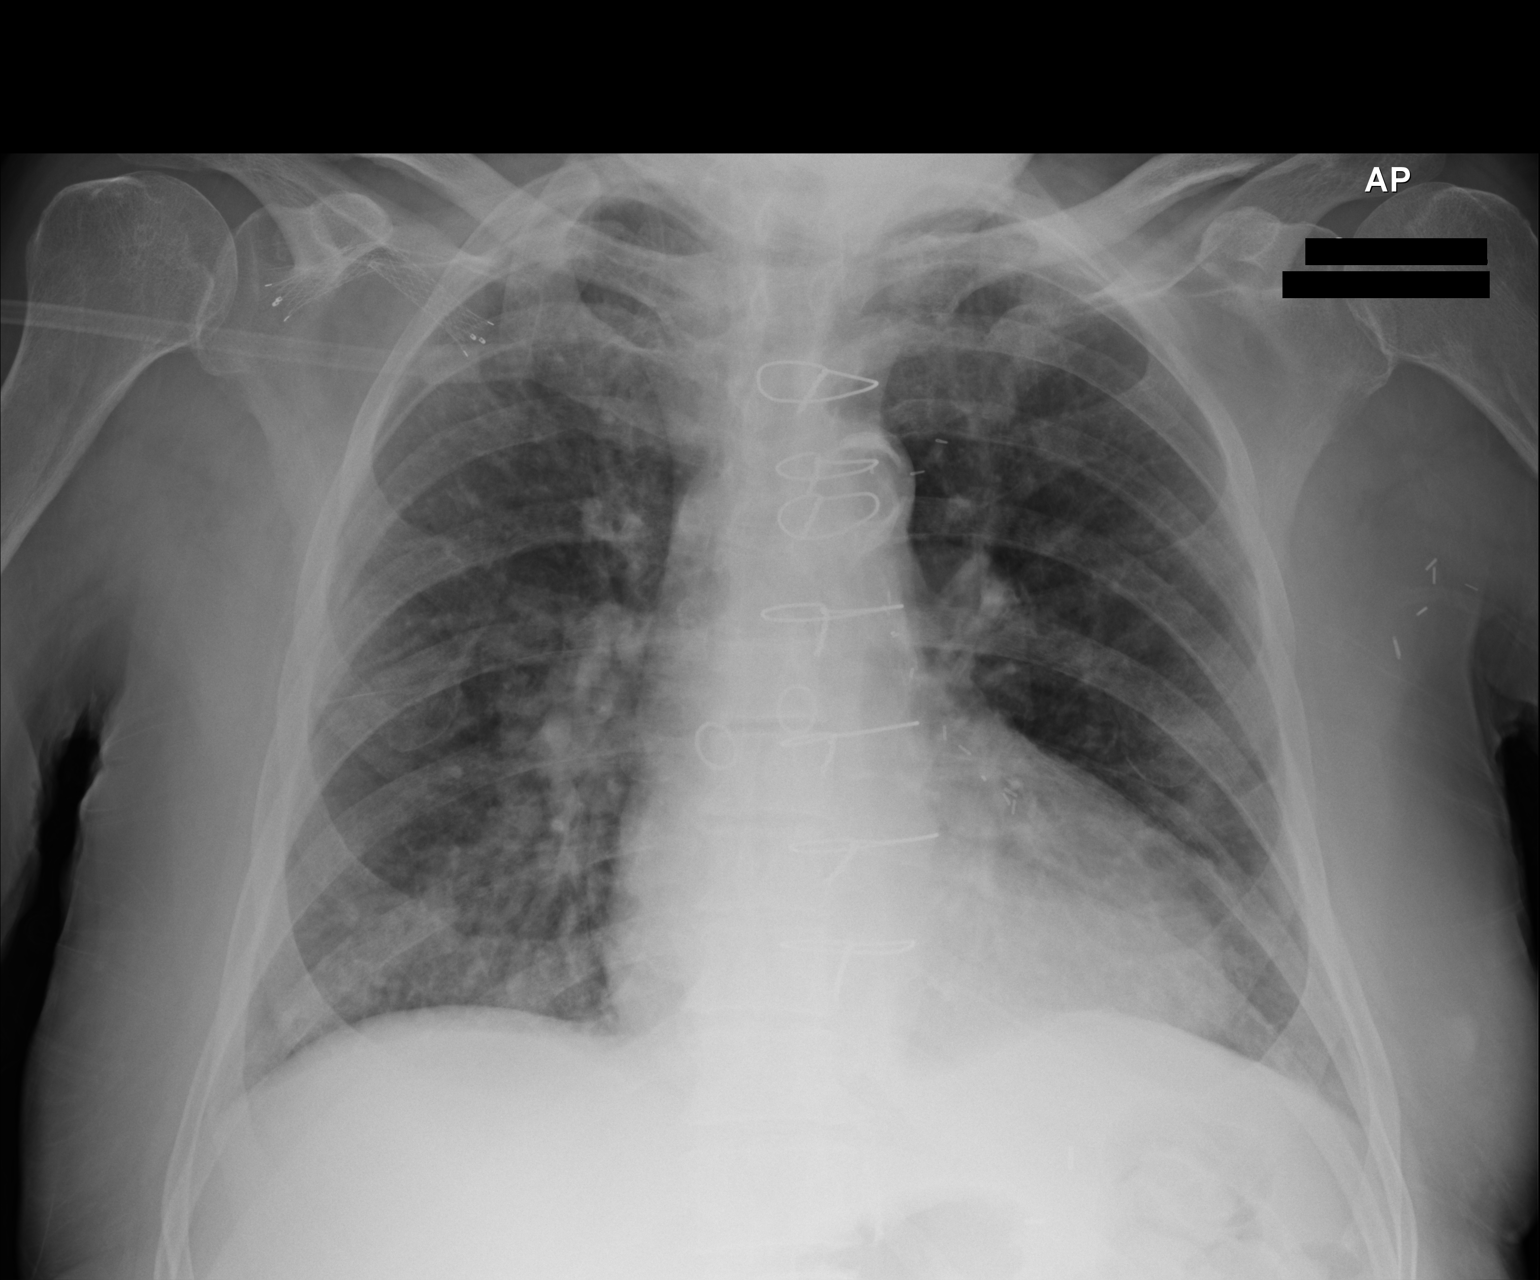

[1 of 1 positions shown; findings below may reference images not displayed]

FINDINGS: The heart size is mildly enlarged. Atherosclerotic calcification
noted within the aortic arch. Previous median sternotomy and CABG
procedure. Moderate pulmonary vascular congestion is noted. No
airspace consolidation.
IMPRESSION: Mild cardiac enlargement and pulmonary vascular congestion

## 2015-12-03 IMAGING — CR DG CHEST 1V PORT
1 series · 1 of 1 positions shown · non-contrast
Comparison: 12/20/2014.

CLINICAL DATA: Aspiration.

EXAM:
PORTABLE CHEST - 1 VIEW

[AP]
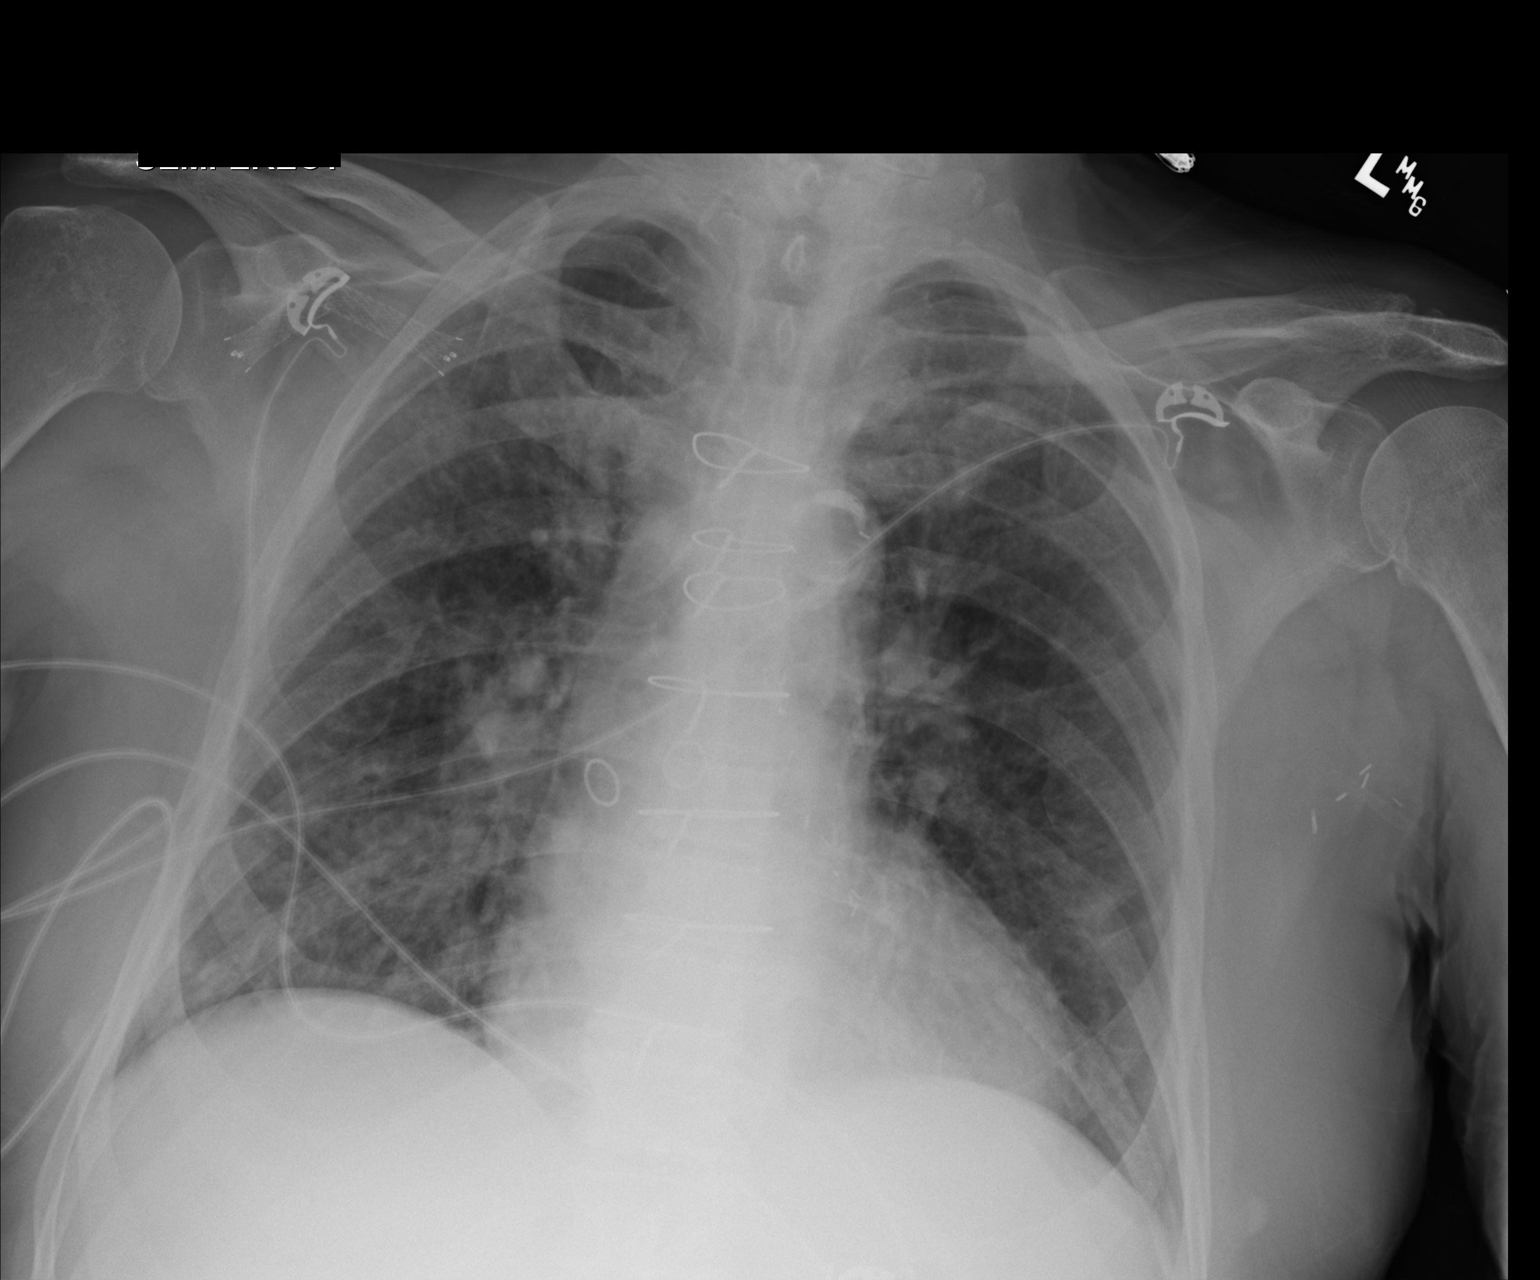

[1 of 1 positions shown; findings below may reference images not displayed]

FINDINGS: Mediastinum and hilar structures normal. Prior CABG. Stable
cardiomegaly and pulmonary vascular congestion with stable mild
interstitial prominence. These findings suggest congestive heart
failure. Pneumonitis project in the right lung base cannot be
excluded. No pleural effusion or pneumothorax. Right subclavian
vascular stents are noted. The stents are in and angled position.
IMPRESSION: 1. Prior CABG. Persistent changes of mild congestive heart failure.
Pneumonitis cannot be excluded, particularly in the right lung base.
No interim change from prior exam .
2. Right subclavian vascular stents are noted. They are in an angled
position.

## 2015-12-05 IMAGING — CR DG CHEST 1V PORT
2 series · 2 of 2 positions shown · non-contrast
Comparison: December 21, 2014.

CLINICAL DATA: Shortness of breath.

EXAM:
PORTABLE CHEST - 1 VIEW

[AP (1 of 2)]
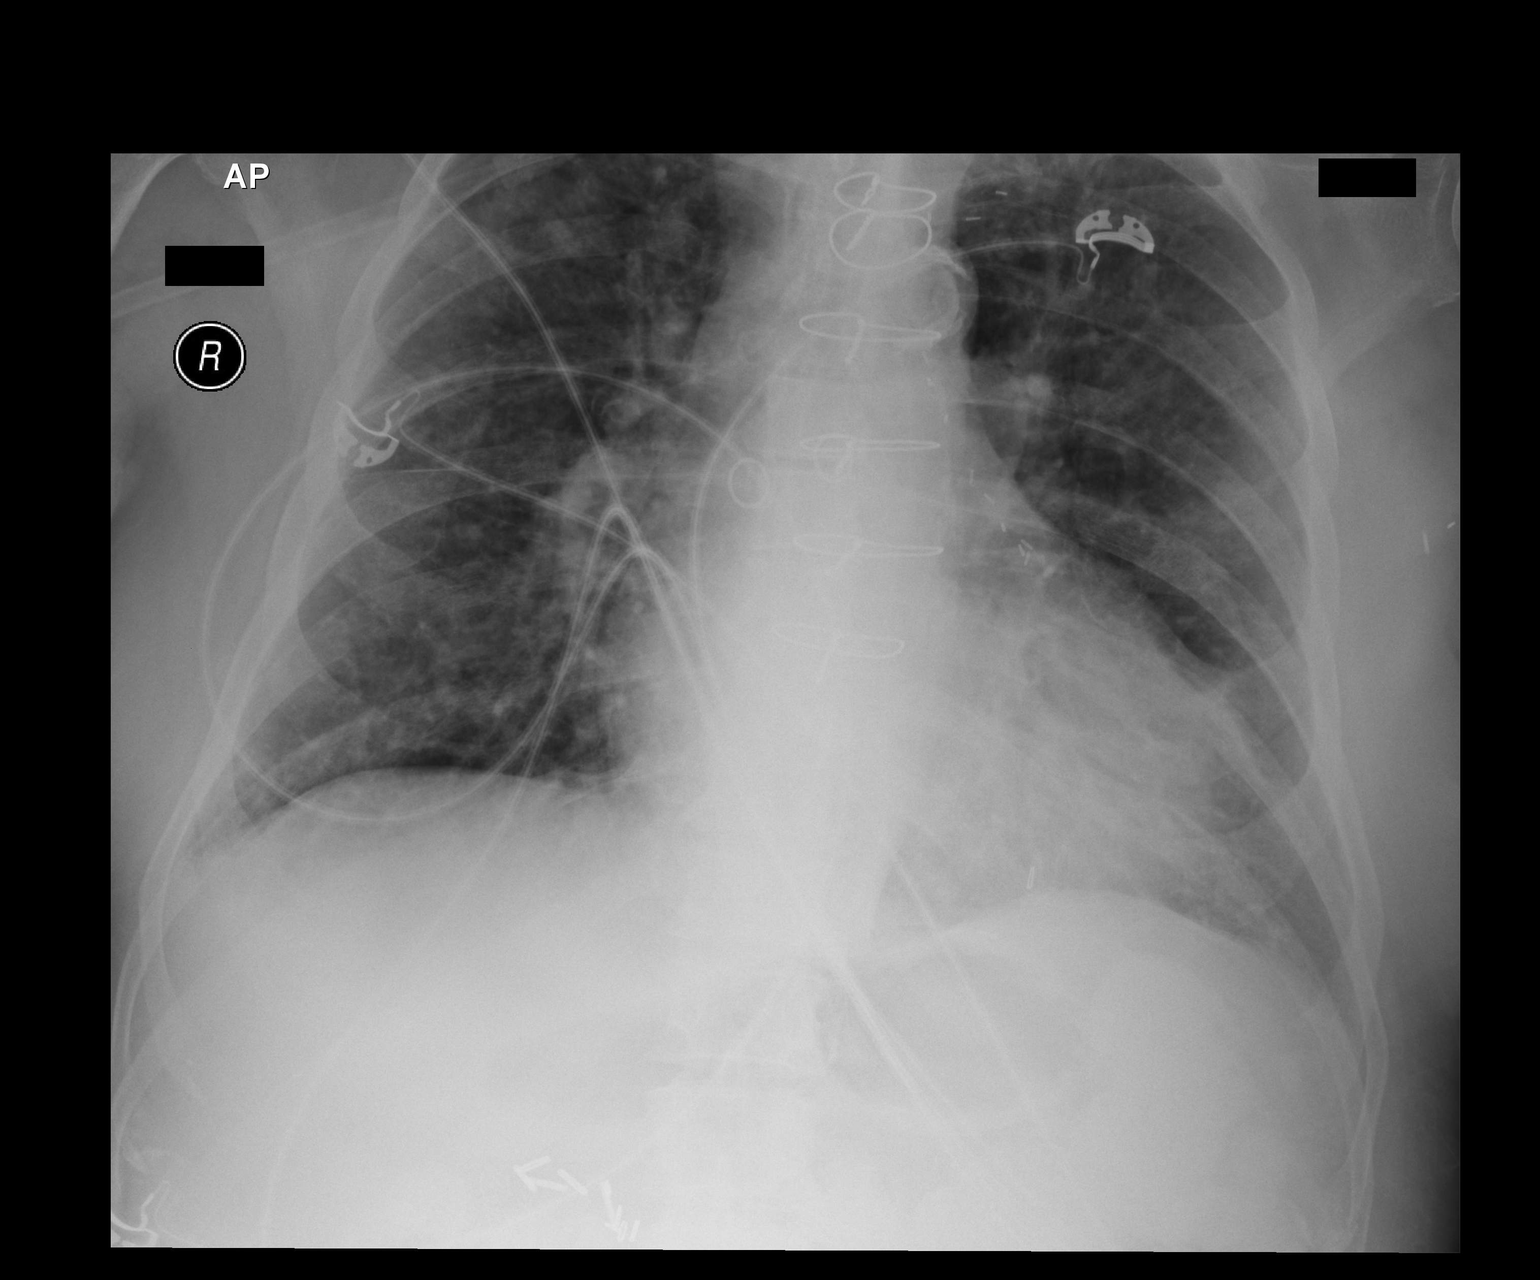

[AP (2 of 2)]
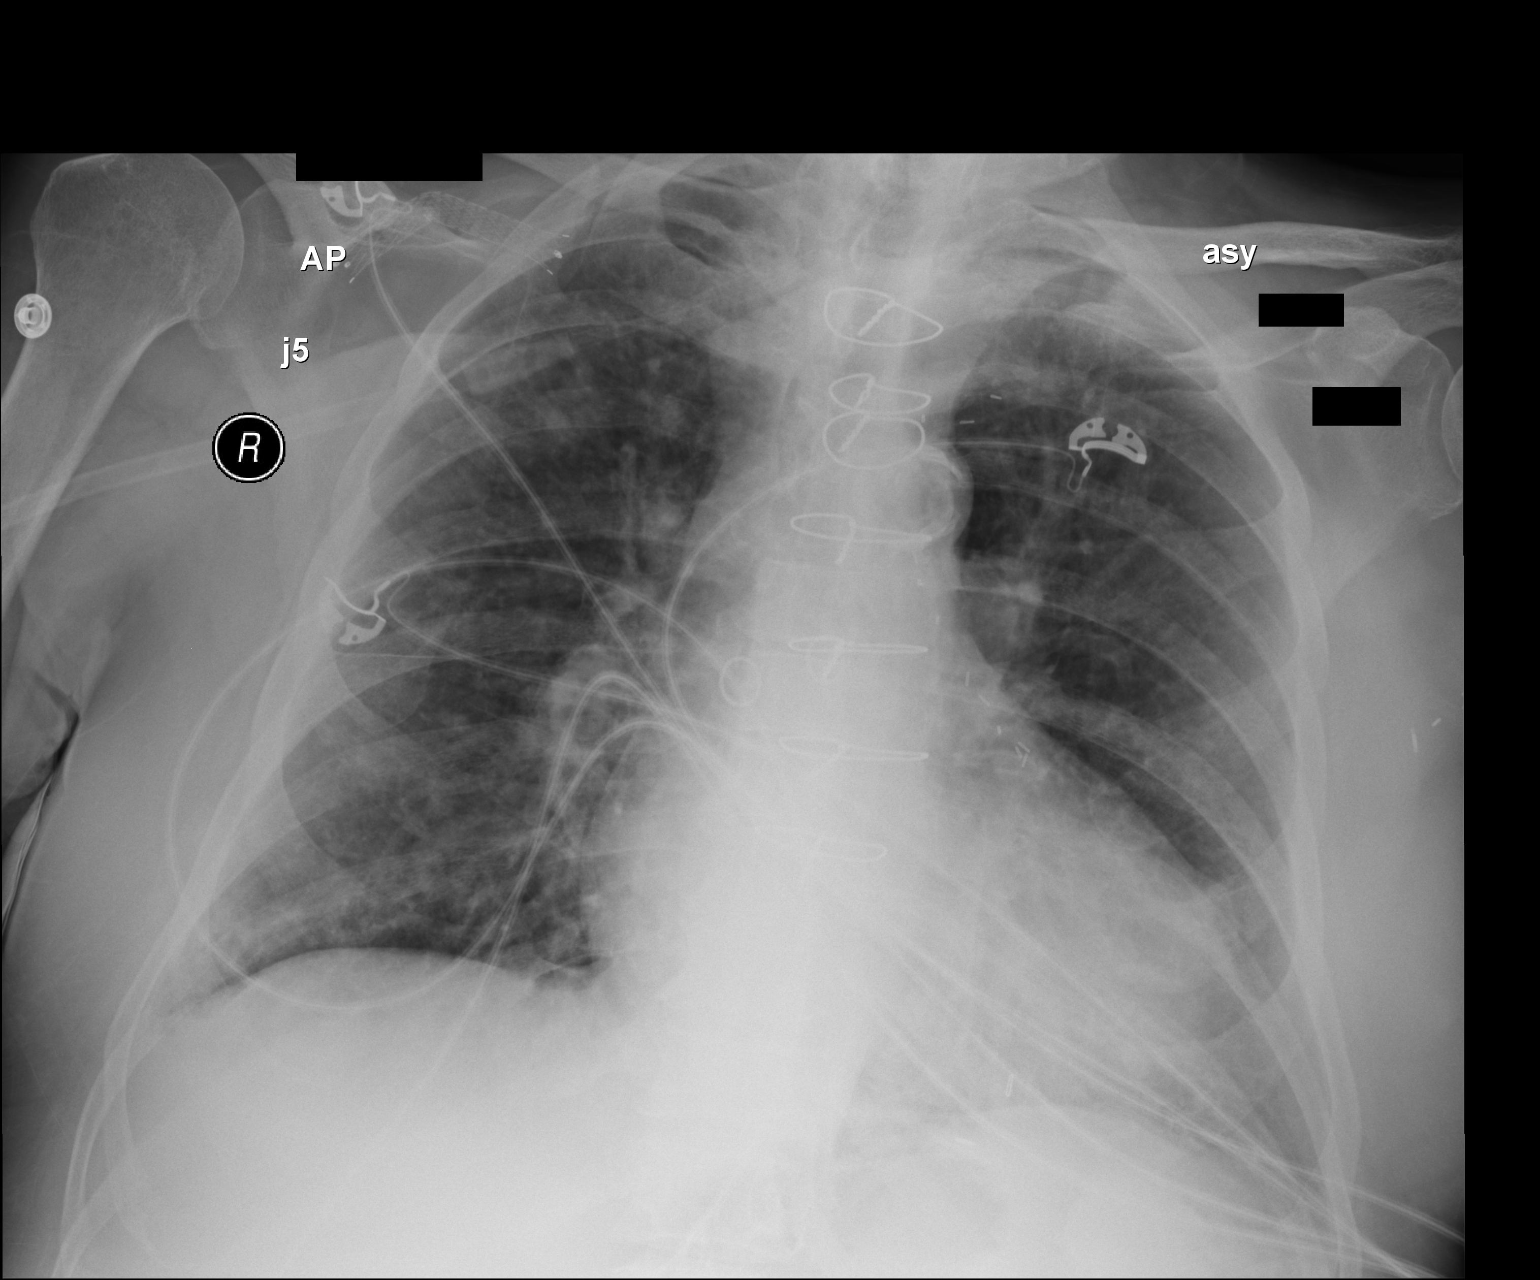

[2 of 2 positions shown; findings below may reference images not displayed]

FINDINGS: Stable cardiomediastinal silhouette. Status post coronary artery
bypass graft. No pneumothorax or pleural effusion is noted. No acute
pulmonary disease is noted. Bony thorax appears intact.
IMPRESSION: No acute cardiopulmonary abnormality seen.
